# Patient Record
Sex: Male | Born: 1945 | Race: White | Hispanic: No | Marital: Married | Smoking: Former smoker
Health system: Southern US, Community
[De-identification: ages and names within clinical notes are randomized; demographics above are authoritative.]

## PROBLEM LIST (undated history)

## (undated) DIAGNOSIS — T884XXA Failed or difficult intubation, initial encounter: Secondary | ICD-10-CM

## (undated) DIAGNOSIS — K219 Gastro-esophageal reflux disease without esophagitis: Secondary | ICD-10-CM

## (undated) DIAGNOSIS — I10 Essential (primary) hypertension: Secondary | ICD-10-CM

## (undated) DIAGNOSIS — G4730 Sleep apnea, unspecified: Secondary | ICD-10-CM

## (undated) DIAGNOSIS — Z803 Family history of malignant neoplasm of breast: Secondary | ICD-10-CM

## (undated) DIAGNOSIS — R519 Headache, unspecified: Secondary | ICD-10-CM

## (undated) DIAGNOSIS — F32A Depression, unspecified: Secondary | ICD-10-CM

## (undated) DIAGNOSIS — I2510 Atherosclerotic heart disease of native coronary artery without angina pectoris: Secondary | ICD-10-CM

## (undated) DIAGNOSIS — Z9189 Other specified personal risk factors, not elsewhere classified: Secondary | ICD-10-CM

## (undated) DIAGNOSIS — R0600 Dyspnea, unspecified: Secondary | ICD-10-CM

## (undated) DIAGNOSIS — I219 Acute myocardial infarction, unspecified: Secondary | ICD-10-CM

## (undated) DIAGNOSIS — C61 Malignant neoplasm of prostate: Secondary | ICD-10-CM

## (undated) DIAGNOSIS — R51 Headache: Secondary | ICD-10-CM

## (undated) DIAGNOSIS — Z8619 Personal history of other infectious and parasitic diseases: Secondary | ICD-10-CM

## (undated) DIAGNOSIS — C801 Malignant (primary) neoplasm, unspecified: Secondary | ICD-10-CM

## (undated) DIAGNOSIS — G629 Polyneuropathy, unspecified: Secondary | ICD-10-CM

## (undated) DIAGNOSIS — E119 Type 2 diabetes mellitus without complications: Secondary | ICD-10-CM

## (undated) DIAGNOSIS — E785 Hyperlipidemia, unspecified: Secondary | ICD-10-CM

## (undated) DIAGNOSIS — F329 Major depressive disorder, single episode, unspecified: Secondary | ICD-10-CM

## (undated) DIAGNOSIS — K5900 Constipation, unspecified: Secondary | ICD-10-CM

## (undated) DIAGNOSIS — Z8546 Personal history of malignant neoplasm of prostate: Secondary | ICD-10-CM

## (undated) DIAGNOSIS — N189 Chronic kidney disease, unspecified: Secondary | ICD-10-CM

## (undated) DIAGNOSIS — D649 Anemia, unspecified: Secondary | ICD-10-CM

## (undated) DIAGNOSIS — T8859XA Other complications of anesthesia, initial encounter: Secondary | ICD-10-CM

## (undated) DIAGNOSIS — J189 Pneumonia, unspecified organism: Secondary | ICD-10-CM

## (undated) DIAGNOSIS — M1990 Unspecified osteoarthritis, unspecified site: Secondary | ICD-10-CM

## (undated) DIAGNOSIS — F4310 Post-traumatic stress disorder, unspecified: Secondary | ICD-10-CM

## (undated) DIAGNOSIS — K760 Fatty (change of) liver, not elsewhere classified: Secondary | ICD-10-CM

## (undated) HISTORY — DX: Depression, unspecified: F32.A

## (undated) HISTORY — DX: Malignant neoplasm of prostate: C61

## (undated) HISTORY — DX: Family history of malignant neoplasm of breast: Z80.3

## (undated) HISTORY — DX: Personal history of malignant neoplasm of prostate: Z85.46

## (undated) HISTORY — DX: Chronic kidney disease, unspecified: N18.9

## (undated) HISTORY — DX: Major depressive disorder, single episode, unspecified: F32.9

## (undated) HISTORY — PX: APPENDECTOMY: SHX54

## (undated) HISTORY — DX: Malignant (primary) neoplasm, unspecified: C80.1

## (undated) HISTORY — DX: Hyperlipidemia, unspecified: E78.5

## (undated) HISTORY — DX: Headache, unspecified: R51.9

## (undated) HISTORY — DX: Post-traumatic stress disorder, unspecified: F43.10

## (undated) HISTORY — PX: CARDIAC SURGERY: SHX584

## (undated) HISTORY — DX: Headache: R51

## (undated) HISTORY — DX: Unspecified osteoarthritis, unspecified site: M19.90

## (undated) HISTORY — DX: Personal history of other infectious and parasitic diseases: Z86.19

## (undated) HISTORY — DX: Other specified personal risk factors, not elsewhere classified: Z91.89

## (undated) HISTORY — PX: EYE SURGERY: SHX253

## (undated) HISTORY — PX: COLONOSCOPY: SHX174

---
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
## 2002-12-15 HISTORY — PX: CORONARY ARTERY BYPASS GRAFT: SHX141

## 2008-12-15 HISTORY — PX: CHOLECYSTECTOMY: SHX55

## 2017-02-11 DIAGNOSIS — R300 Dysuria: Secondary | ICD-10-CM | POA: Diagnosis not present

## 2017-02-11 DIAGNOSIS — R350 Frequency of micturition: Secondary | ICD-10-CM | POA: Diagnosis not present

## 2017-02-11 DIAGNOSIS — R3129 Other microscopic hematuria: Secondary | ICD-10-CM | POA: Diagnosis not present

## 2017-02-11 DIAGNOSIS — Z8546 Personal history of malignant neoplasm of prostate: Secondary | ICD-10-CM | POA: Diagnosis not present

## 2017-02-23 DIAGNOSIS — N451 Epididymitis: Secondary | ICD-10-CM | POA: Diagnosis not present

## 2017-02-23 DIAGNOSIS — N390 Urinary tract infection, site not specified: Secondary | ICD-10-CM | POA: Diagnosis not present

## 2017-02-24 DIAGNOSIS — N451 Epididymitis: Secondary | ICD-10-CM | POA: Diagnosis not present

## 2017-02-24 DIAGNOSIS — N509 Disorder of male genital organs, unspecified: Secondary | ICD-10-CM | POA: Diagnosis not present

## 2017-02-24 DIAGNOSIS — N433 Hydrocele, unspecified: Secondary | ICD-10-CM | POA: Diagnosis not present

## 2017-02-25 DIAGNOSIS — N451 Epididymitis: Secondary | ICD-10-CM | POA: Diagnosis not present

## 2017-02-26 DIAGNOSIS — N390 Urinary tract infection, site not specified: Secondary | ICD-10-CM | POA: Diagnosis not present

## 2017-02-27 DIAGNOSIS — N451 Epididymitis: Secondary | ICD-10-CM | POA: Diagnosis not present

## 2017-03-16 DIAGNOSIS — N451 Epididymitis: Secondary | ICD-10-CM | POA: Diagnosis not present

## 2017-03-16 DIAGNOSIS — N432 Other hydrocele: Secondary | ICD-10-CM | POA: Diagnosis not present

## 2017-03-16 DIAGNOSIS — J0100 Acute maxillary sinusitis, unspecified: Secondary | ICD-10-CM | POA: Diagnosis not present

## 2017-03-16 DIAGNOSIS — R3129 Other microscopic hematuria: Secondary | ICD-10-CM | POA: Diagnosis not present

## 2017-03-16 DIAGNOSIS — N390 Urinary tract infection, site not specified: Secondary | ICD-10-CM | POA: Diagnosis not present

## 2017-06-18 DIAGNOSIS — H16041 Marginal corneal ulcer, right eye: Secondary | ICD-10-CM | POA: Diagnosis not present

## 2017-06-23 DIAGNOSIS — E782 Mixed hyperlipidemia: Secondary | ICD-10-CM | POA: Diagnosis not present

## 2017-06-23 DIAGNOSIS — I10 Essential (primary) hypertension: Secondary | ICD-10-CM | POA: Diagnosis not present

## 2017-06-23 DIAGNOSIS — Z951 Presence of aortocoronary bypass graft: Secondary | ICD-10-CM | POA: Diagnosis not present

## 2017-06-23 DIAGNOSIS — I25110 Atherosclerotic heart disease of native coronary artery with unstable angina pectoris: Secondary | ICD-10-CM | POA: Diagnosis not present

## 2017-06-26 DIAGNOSIS — I25119 Atherosclerotic heart disease of native coronary artery with unspecified angina pectoris: Secondary | ICD-10-CM | POA: Diagnosis not present

## 2017-06-26 DIAGNOSIS — N189 Chronic kidney disease, unspecified: Secondary | ICD-10-CM | POA: Diagnosis not present

## 2017-06-26 DIAGNOSIS — I129 Hypertensive chronic kidney disease with stage 1 through stage 4 chronic kidney disease, or unspecified chronic kidney disease: Secondary | ICD-10-CM | POA: Diagnosis not present

## 2017-06-26 DIAGNOSIS — E782 Mixed hyperlipidemia: Secondary | ICD-10-CM | POA: Diagnosis not present

## 2017-06-26 DIAGNOSIS — Z7982 Long term (current) use of aspirin: Secondary | ICD-10-CM | POA: Diagnosis not present

## 2017-06-26 DIAGNOSIS — Z87891 Personal history of nicotine dependence: Secondary | ICD-10-CM | POA: Diagnosis not present

## 2017-06-26 DIAGNOSIS — Z955 Presence of coronary angioplasty implant and graft: Secondary | ICD-10-CM | POA: Diagnosis not present

## 2017-06-26 DIAGNOSIS — E1122 Type 2 diabetes mellitus with diabetic chronic kidney disease: Secondary | ICD-10-CM | POA: Diagnosis not present

## 2017-06-26 DIAGNOSIS — I25110 Atherosclerotic heart disease of native coronary artery with unstable angina pectoris: Secondary | ICD-10-CM | POA: Diagnosis not present

## 2017-06-26 DIAGNOSIS — F4310 Post-traumatic stress disorder, unspecified: Secondary | ICD-10-CM | POA: Diagnosis not present

## 2017-06-26 HISTORY — PX: CARDIAC CATHETERIZATION: SHX172

## 2017-06-30 DIAGNOSIS — E119 Type 2 diabetes mellitus without complications: Secondary | ICD-10-CM | POA: Diagnosis not present

## 2017-06-30 DIAGNOSIS — I209 Angina pectoris, unspecified: Secondary | ICD-10-CM | POA: Diagnosis not present

## 2017-06-30 DIAGNOSIS — K210 Gastro-esophageal reflux disease with esophagitis: Secondary | ICD-10-CM | POA: Diagnosis not present

## 2017-06-30 DIAGNOSIS — I119 Hypertensive heart disease without heart failure: Secondary | ICD-10-CM | POA: Diagnosis not present

## 2017-07-03 DIAGNOSIS — N451 Epididymitis: Secondary | ICD-10-CM | POA: Diagnosis not present

## 2017-07-03 DIAGNOSIS — N509 Disorder of male genital organs, unspecified: Secondary | ICD-10-CM | POA: Diagnosis not present

## 2017-07-03 DIAGNOSIS — N432 Other hydrocele: Secondary | ICD-10-CM | POA: Diagnosis not present

## 2017-07-24 DIAGNOSIS — H16042 Marginal corneal ulcer, left eye: Secondary | ICD-10-CM | POA: Diagnosis not present

## 2017-07-28 DIAGNOSIS — H16043 Marginal corneal ulcer, bilateral: Secondary | ICD-10-CM | POA: Diagnosis not present

## 2017-10-12 DIAGNOSIS — Z8546 Personal history of malignant neoplasm of prostate: Secondary | ICD-10-CM | POA: Diagnosis not present

## 2017-10-12 DIAGNOSIS — R3129 Other microscopic hematuria: Secondary | ICD-10-CM | POA: Diagnosis not present

## 2017-10-12 DIAGNOSIS — R300 Dysuria: Secondary | ICD-10-CM | POA: Diagnosis not present

## 2018-05-12 DIAGNOSIS — R3129 Other microscopic hematuria: Secondary | ICD-10-CM | POA: Diagnosis not present

## 2018-05-12 DIAGNOSIS — N451 Epididymitis: Secondary | ICD-10-CM | POA: Diagnosis not present

## 2018-05-12 DIAGNOSIS — Z8546 Personal history of malignant neoplasm of prostate: Secondary | ICD-10-CM | POA: Diagnosis not present

## 2018-06-09 DIAGNOSIS — N182 Chronic kidney disease, stage 2 (mild): Secondary | ICD-10-CM | POA: Diagnosis not present

## 2018-06-09 DIAGNOSIS — E119 Type 2 diabetes mellitus without complications: Secondary | ICD-10-CM | POA: Diagnosis not present

## 2018-06-09 DIAGNOSIS — I119 Hypertensive heart disease without heart failure: Secondary | ICD-10-CM | POA: Diagnosis not present

## 2018-06-09 DIAGNOSIS — M1710 Unilateral primary osteoarthritis, unspecified knee: Secondary | ICD-10-CM | POA: Diagnosis not present

## 2018-06-10 DIAGNOSIS — H35033 Hypertensive retinopathy, bilateral: Secondary | ICD-10-CM | POA: Diagnosis not present

## 2018-06-10 DIAGNOSIS — H43813 Vitreous degeneration, bilateral: Secondary | ICD-10-CM | POA: Diagnosis not present

## 2018-06-10 DIAGNOSIS — E119 Type 2 diabetes mellitus without complications: Secondary | ICD-10-CM | POA: Diagnosis not present

## 2018-06-10 DIAGNOSIS — Z961 Presence of intraocular lens: Secondary | ICD-10-CM | POA: Diagnosis not present

## 2018-06-10 DIAGNOSIS — Z794 Long term (current) use of insulin: Secondary | ICD-10-CM | POA: Diagnosis not present

## 2018-06-14 DIAGNOSIS — M25561 Pain in right knee: Secondary | ICD-10-CM | POA: Diagnosis not present

## 2018-06-14 DIAGNOSIS — M1711 Unilateral primary osteoarthritis, right knee: Secondary | ICD-10-CM | POA: Diagnosis not present

## 2018-06-29 DIAGNOSIS — M1711 Unilateral primary osteoarthritis, right knee: Secondary | ICD-10-CM | POA: Diagnosis not present

## 2018-06-29 DIAGNOSIS — M25561 Pain in right knee: Secondary | ICD-10-CM | POA: Diagnosis not present

## 2018-08-12 DIAGNOSIS — N433 Hydrocele, unspecified: Secondary | ICD-10-CM | POA: Diagnosis not present

## 2018-08-12 DIAGNOSIS — N451 Epididymitis: Secondary | ICD-10-CM | POA: Diagnosis not present

## 2018-08-19 DIAGNOSIS — R3129 Other microscopic hematuria: Secondary | ICD-10-CM | POA: Diagnosis not present

## 2018-08-19 DIAGNOSIS — N451 Epididymitis: Secondary | ICD-10-CM | POA: Diagnosis not present

## 2018-08-19 DIAGNOSIS — Z8546 Personal history of malignant neoplasm of prostate: Secondary | ICD-10-CM | POA: Diagnosis not present

## 2018-10-25 DIAGNOSIS — H6121 Impacted cerumen, right ear: Secondary | ICD-10-CM | POA: Diagnosis not present

## 2018-11-02 ENCOUNTER — Ambulatory Visit (HOSPITAL_COMMUNITY)
Admission: EM | Admit: 2018-11-02 | Discharge: 2018-11-02 | Disposition: A | Discharge status: Home / Self Care | Payer: Medicare Other | Attending: Family Medicine | Admitting: Family Medicine

## 2018-11-02 ENCOUNTER — Encounter (HOSPITAL_COMMUNITY): Payer: Self-pay | Admitting: Emergency Medicine

## 2018-11-02 DIAGNOSIS — H6121 Impacted cerumen, right ear: Secondary | ICD-10-CM | POA: Diagnosis not present

## 2018-11-02 HISTORY — DX: Essential (primary) hypertension: I10

## 2018-11-02 HISTORY — DX: Type 2 diabetes mellitus without complications: E11.9

## 2018-11-02 NOTE — ED Triage Notes (Signed)
Pt here for clogged right ear with possible cotton stuck in ear

## 2018-11-02 NOTE — ED Provider Notes (Addendum)
[place]    CSN: [id number] Arrival date & time: 11/02/18  1106     History   Chief Complaint Chief Complaint  Patient presents with  . Foreign Body in [place] is a 72 y.o. male.   Pt is a 72 year old male that presents with possible ear wax or foreign body in the right ear. He believed it to be wax and was seen at another urgent care and they were unable to remove the wax. He was then given cortisporin and ear wax removal drops and since has had no relief. He denies any pain or discomfort. He has some ringing at times and hard of hearing. He denies any associated fever, chills, body aches, fatigue.   ROS per HPI      Past Medical History:  Diagnosis Date  . Diabetes mellitus without complication ([place])   . Hypertension     There are no active problems to display for this patient.   History reviewed. No pertinent surgical history.     Home Medications    Prior to Admission medications   Medication Sig Start Date End Date Taking? Authorizing Provider  aspirin 81 MG chewable tablet Chew by mouth daily.   Yes [provider]  FLUoxetine (PROZAC) 40 MG capsule Take 40 mg by mouth daily.   Yes [provider]  hydrochlorothiazide (HYDRODIURIL) 25 MG tablet Take 25 mg by mouth daily.   Yes [provider]  insulin glargine (LANTUS) 100 UNIT/ML injection Inject 90 Units into the skin daily.   Yes [provider]  insulin regular (NOVOLIN R,HUMULIN R) 100 units/mL injection Inject 5 Units into the skin 3 (three) times daily before meals.   Yes [provider]  isosorbide mononitrate (IMDUR) 30 MG 24 hr tablet Take 30 mg by mouth daily.   Yes [provider]  losartan (COZAAR) 100 MG tablet Take 100 mg by mouth daily.   Yes [provider]  metoprolol tartrate (LOPRESSOR) 25 MG tablet Take 25 mg by mouth 2 (two) times daily.   Yes [provider]    neomycin-colistin-hydrocortisone-thonzonium (CORTISPORIN TC) 3.02-14-09-0.5 MG/ML OTIC suspension Place 3 drops into the right ear 4 (four) times daily.   Yes [provider]  nitroGLYCERIN (NITROSTAT) 0.4 MG SL tablet Place 0.4 mg under the tongue every 5 (five) minutes as needed for chest pain.   Yes [provider]  simvastatin (ZOCOR) 40 MG tablet Take 40 mg by mouth daily.   Yes [provider]  tamsulosin (FLOMAX) 0.4 MG CAPS capsule Take 0.4 mg by mouth.   Yes [provider]  traZODone (DESYREL) 100 MG tablet Take 100 mg by mouth at bedtime.   Yes [provider]    Family History History reviewed. No pertinent family history.  Social History Social History   Tobacco Use  . Smoking status: Former Research scientist (life sciences)  . Smokeless tobacco: Never Used  Substance Use Topics  . Alcohol use: Not Currently  . Drug use: Never     Allergies   Patient has no known allergies.   Review of Systems Review of Systems   Physical Exam Triage Vital Signs ED Triage Vitals [11/02/18 1151]  Enc Vitals Group     BP (!) 121/51     Pulse Rate 60     Resp 18     Temp 98 F (36.7 C)     Temp Source Oral     SpO2 96 %  Weight      Height      Head Circumference      Peak Flow      Pain Score 0     Pain Loc      Pain Edu?      Excl. in [place]?    No data found.  Updated Vital Signs BP (!) 121/51 (BP Location: Left Arm)   Pulse 60   Temp 98 F (36.7 C) (Oral)   Resp 18   SpO2 96%   Visual Acuity Right Eye Distance:   Left Eye Distance:   Bilateral Distance:    Right Eye Near:   Left Eye Near:    Bilateral Near:     Physical Exam  Constitutional: He appears well-developed and well-nourished.  HENT:  Head: Normocephalic and atraumatic.  Left TM mild cerumen impaction but TM visible and normal Right TM cerumen impaction and unable to visualize TM  Eyes: Conjunctivae are normal.  Neck: Normal range of motion.  Pulmonary/Chest: Effort  normal.  Musculoskeletal: Normal range of motion.  Neurological: He is alert.  Skin: Skin is warm and dry.  Psychiatric: He has a normal mood and affect.  Nursing note and vitals reviewed.    UC Treatments / Results  Labs (all labs ordered are listed, but only abnormal results are displayed) Labs Reviewed - No data to display  EKG None  Radiology No results found.  Procedures Ear Cerumen Removal Date/Time: 11/02/2018 3:35 PM Performed by: Orvan July, NP Authorized by: Vanessa Kick, MD   Consent:    Consent obtained:  Verbal   Consent given by:  Patient   Alternatives discussed:  No treatment Procedure details:    Location:  R ear   Procedure type: irrigation   Post-procedure details:    Inspection:  TM intact   Hearing quality:  Improved   Patient tolerance of procedure:  Tolerated well, no immediate complications   (including critical care time)  Medications Ordered in UC Medications - No data to display  Initial Impression / Assessment and Plan / UC Course  I have reviewed the triage vital signs and the nursing notes.  Pertinent labs & imaging results that were available during my care of the patient were reviewed by me and considered in my medical decision making (see chart for details).     Moderate amount of wax removed from patient right ear Patient feeling relief and able to hear better TM viewable and normal-no signs of infection Final Clinical Impressions(s) / UC Diagnoses   Final diagnoses:  Impacted cerumen of right ear     Discharge Instructions     We removed the wax from your ear I hope you are feeling better Follow up as needed for continued or worsening symptoms     ED Prescriptions    None     Controlled Substance Prescriptions [place] Controlled Substance Registry consulted? Not Applicable   Orvan July, NP 11/02/18 1535    Loura Halt A, NP 11/02/18 1536

## 2018-11-02 NOTE — Discharge Instructions (Signed)
We removed the wax from your ear I hope you are feeling better Follow up as needed for continued or worsening symptoms

## 2018-11-30 ENCOUNTER — Ambulatory Visit (INDEPENDENT_AMBULATORY_CARE_PROVIDER_SITE_OTHER): Payer: Medicare Other | Admitting: Family Medicine

## 2018-11-30 ENCOUNTER — Encounter: Payer: Self-pay | Admitting: Family Medicine

## 2018-11-30 VITALS — BP 130/84 | HR 69 | Temp 98.3°F | Resp 16 | Ht 72.0 in | Wt 289.2 lb

## 2018-11-30 DIAGNOSIS — E1140 Type 2 diabetes mellitus with diabetic neuropathy, unspecified: Secondary | ICD-10-CM

## 2018-11-30 DIAGNOSIS — I2510 Atherosclerotic heart disease of native coronary artery without angina pectoris: Secondary | ICD-10-CM | POA: Diagnosis not present

## 2018-11-30 DIAGNOSIS — G6289 Other specified polyneuropathies: Secondary | ICD-10-CM

## 2018-11-30 DIAGNOSIS — N401 Enlarged prostate with lower urinary tract symptoms: Secondary | ICD-10-CM

## 2018-11-30 DIAGNOSIS — Z794 Long term (current) use of insulin: Secondary | ICD-10-CM

## 2018-11-30 DIAGNOSIS — Z8546 Personal history of malignant neoplasm of prostate: Secondary | ICD-10-CM

## 2018-11-30 DIAGNOSIS — I10 Essential (primary) hypertension: Secondary | ICD-10-CM | POA: Diagnosis not present

## 2018-11-30 DIAGNOSIS — G4700 Insomnia, unspecified: Secondary | ICD-10-CM | POA: Diagnosis not present

## 2018-11-30 LAB — BASIC METABOLIC PANEL
BUN: 20 mg/dL (ref 6–23)
CHLORIDE: 102 meq/L (ref 96–112)
CO2: 29 mEq/L (ref 19–32)
CREATININE: 1.56 mg/dL — AB (ref 0.40–1.50)
Calcium: 9.1 mg/dL (ref 8.4–10.5)
GFR: 46.46 mL/min — ABNORMAL LOW (ref >60.00)
GLUCOSE: 185 mg/dL — AB (ref 70–99)
Potassium: 3.7 mEq/L (ref 3.5–5.1)
Sodium: 140 mEq/L (ref 135–145)

## 2018-11-30 LAB — HEMOGLOBIN A1C: HEMOGLOBIN A1C: 5.5 % (ref 4.6–6.5)

## 2018-11-30 LAB — MICROALBUMIN / CREATININE URINE RATIO
CREATININE, U: 160.7 mg/dL
Microalb Creat Ratio: 3.5 mg/g (ref 0.0–30.0)
Microalb, Ur: 5.7 mg/dL — ABNORMAL HIGH (ref 0.0–1.9)

## 2018-11-30 MED ORDER — GABAPENTIN 100 MG PO CAPS: ORAL_CAPSULE | ORAL | 0 refills | Status: DC

## 2018-11-30 NOTE — Progress Notes (Signed)
HPI:   [name] is a 72 y.o. male, who is here today with his wife to establish care.  Former PCP: Dr [name]. He just moved from [place] Last preventive routine visit: 06/2018.  Chronic medical problems: Hypertension, hyperlipidemia, CAD, BPH, insomnia, and unstable gait (OA). Generalized OA, knees,hips,low back. Exacerbated by prolonged walking/standing and when getting up after prolonged sitting. + Stiffness   Dizzy spells intermittent for many years. No falls in 5 years.  Insomnia on Trazodone 100 mg at bedtime. Medication helps. No side effects reported.  HTN: Currently he is on metoprolol tartrate 25 mg twice daily, losartan 100 mg daily, Imdur 30 mg daily, and HCTZ 25 mg daily. Denies severe/frequent headache, visual changes, chest pain, dyspnea, palpitation,focal weakness, or edema. CAD (Band PAD, he has been following with cardiologist annually. + CKD III  DM 2: Dx around 2015. He is on Lantus 90 U daily and Novolin 5 U before meals. BS's 150's Occasional hypoglycemia, does not recall numbers, usually when he skips meals.. Denies abdominal pain, nausea,vomiting, polydipsia,polyuria, or polyphagia.  C/O LE numbness and tingling. Feet achy pain,mainly at night. Problem has been going on for years.   HLD, he is on Simvastatin 40 mg daily. He also follows a low fat diet. No side effects from medication.   GERD, he is on Omeprazole 40 mg bid.  Denies abdominal pain, nausea, vomiting, changes in bowel habits, blood in stool or melena.  Urinary incontinence and microscopic hematuria. He follows with urologist q 3 months.  Hx of prostate cancer, completed radiation over 5 years ago.  Depression Dx many years ago. He has been on Prozac 20 mg since 1995. Denies suicidal thoughts.   Review of Systems  Constitutional: Negative for activity change, appetite change, fatigue and fever.  HENT: Negative for nosebleeds, sore throat and trouble  swallowing.   Eyes: Negative for redness and visual disturbance.  Respiratory: Negative for cough, shortness of breath and wheezing.   Cardiovascular: Negative for chest pain, palpitations and leg swelling.  Gastrointestinal: Negative for abdominal pain, nausea and vomiting.  Endocrine: Negative for polydipsia, polyphagia and polyuria.  Genitourinary: Negative for decreased urine volume, dysuria and hematuria.  Musculoskeletal: Positive for arthralgias and gait problem.  Neurological: Negative for syncope, weakness and headaches.  Psychiatric/Behavioral: Positive for sleep disturbance. Negative for confusion.      Current Outpatient Medications on File Prior to Visit  Medication Sig Dispense Refill  . aspirin 81 MG chewable tablet Chew by mouth daily.    . Calcium Carbonate (CALCIUM 600 PO) Take by mouth daily.    Marland Kitchen FLUoxetine (PROZAC) 40 MG capsule Take 40 mg by mouth daily.    . hydrochlorothiazide (HYDRODIURIL) 25 MG tablet Take 25 mg by mouth daily.    . insulin glargine (LANTUS) 100 UNIT/ML injection Inject 90 Units into the skin daily.    . insulin regular (NOVOLIN R,HUMULIN R) 100 units/mL injection Inject 5 Units into the skin 3 (three) times daily before meals.    . isosorbide mononitrate (IMDUR) 30 MG 24 hr tablet Take 30 mg by mouth daily.    Marland Kitchen losartan (COZAAR) 100 MG tablet Take 100 mg by mouth daily.    . metoprolol tartrate (LOPRESSOR) 25 MG tablet Take 25 mg by mouth 2 (two) times daily.    . Multiple Vitamin (MULTI-VITAMINS) TABS Take by mouth.    . neomycin-colistin-hydrocortisone-thonzonium (CORTISPORIN TC) 3.02-14-09-0.5 MG/ML OTIC suspension Place 3 drops into the right ear 4 (four) times daily.    Marland Kitchen  nitroGLYCERIN (NITROSTAT) 0.4 MG SL tablet Place 0.4 mg under the tongue every 5 (five) minutes as needed for chest pain.    . simvastatin (ZOCOR) 40 MG tablet Take 40 mg by mouth daily.    . tamsulosin (FLOMAX) 0.4 MG CAPS capsule Take 0.4 mg by mouth.    . traZODone  (DESYREL) 100 MG tablet Take 100 mg by mouth at bedtime.     No current facility-administered medications on file prior to visit.      Past Medical History:  Diagnosis Date  . Arthritis   . Cancer ([place])   . Chronic kidney disease   . Depression   . Diabetes mellitus without complication ([place])   . Frequent headaches   . History of chicken pox   . History of fainting spells of unknown cause   . Hyperlipidemia   . Hypertension    No Known Allergies  Family History  Problem Relation Age of Onset  . Arthritis Mother   . Cancer Mother   . Heart disease Mother   . Hyperlipidemia Mother   . Hypertension Mother   . Heart attack Mother   . Cancer Sister   . Heart attack Brother   . Heart disease Brother   . Depression Daughter   . Arthritis Sister   . Cancer Brother   . Learning disabilities Brother   . Alcohol abuse Brother   . COPD Brother   . Heart attack Brother   . Heart disease Brother     Social History   Socioeconomic History  . Marital status: Married    Spouse name: Not on file  . Number of children: Not on file  . Years of education: Not on file  . Highest education level: Not on file  Occupational History  . Not on file  Social Needs  . Financial resource strain: Not on file  . Food insecurity:    Worry: Not on file    Inability: Not on file  . Transportation needs:    Medical: Not on file    Non-medical: Not on file  Tobacco Use  . Smoking status: Former Research scientist (life sciences)  . Smokeless tobacco: Never Used  Substance and Sexual Activity  . Alcohol use: Not Currently  . Drug use: Never  . Sexual activity: Not Currently  Lifestyle  . Physical activity:    Days per week: Not on file    Minutes per session: Not on file  . Stress: Not on file  Relationships  . Social connections:    Talks on phone: Not on file    Gets together: Not on file    Attends religious service: Not on file    Active member of club or organization: Not on file    Attends meetings  of clubs or organizations: Not on file    Relationship status: Not on file  Other Topics Concern  . Not on file  Social History Narrative  . Not on file    Vitals:   11/30/18 0834  BP: 130/84  Pulse: 69  Resp: 16  Temp: 98.3 F (36.8 C)  SpO2: 96%    Body mass index is 39.23 kg/m.   Physical Exam  Nursing note and vitals reviewed. Constitutional: He is oriented to person, place, and time. He appears well-developed. No distress.  HENT:  Head: Normocephalic and atraumatic.  Mouth/Throat: Oropharynx is clear and moist and mucous membranes are normal.  Eyes: Pupils are equal, round, and reactive to light. Conjunctivae are normal.  Cardiovascular:  Normal rate and regular rhythm.  No murmur heard. Pulses:      Dorsalis pedis pulses are 2+ on the right side and 2+ on the left side.  Respiratory: Effort normal and breath sounds normal. No respiratory distress.  GI: Soft. He exhibits no mass. There is no hepatomegaly. There is no abdominal tenderness.  Musculoskeletal:        General: No edema.  Lymphadenopathy:    He has no cervical adenopathy.  Neurological: He is alert and oriented to person, place, and time. He has normal strength. No cranial nerve deficit. Gait abnormal.  Unstable gait assisted with a cane.  Skin: Skin is warm. No rash noted. No erythema.  Psychiatric: He has a normal mood and affect. Cognition and memory are normal.  Well groomed, good eye contact.   Diabetic Foot Exam - Simple   Simple Foot Form Diabetic Foot exam was performed with the following findings:  Yes 11/30/2018  9:12 AM  Visual Inspection See comments:  Yes Sensation Testing See comments:  Yes Pulse Check Posterior Tibialis and Dorsalis pulse intact bilaterally:  Yes Comments Monofilament absent, bilateral. Hypertrophic and long toenails.       ASSESSMENT AND PLAN:  [name] was seen today for establish care.  Diagnoses and all orders for this visit:  Lab Results    Component Value Date   CREATININE 1.56 (H) 11/30/2018   BUN 20 11/30/2018   NA 140 11/30/2018   K 3.7 11/30/2018   CL 102 11/30/2018   CO2 29 11/30/2018   Lab Results  Component Value Date   HGBA1C 5.5 11/30/2018   Lab Results  Component Value Date   HGBA1C 5.5 11/30/2018    Type 2 diabetes mellitus with diabetic neuropathy, with long-term current use of insulin ([place]) HgA1C has been at goal, 6.1. No changes in current management, will adjust insulin according to A1C done today. Regular exercise as tolerated and healthy diet with avoidance of added sugar food intake is an important part of treatment and recommended. Annual eye exam, periodic dental and foot care recommended. F/U in 5-6 months  -     Microalbumin / creatinine urine ratio -     Basic metabolic panel -     Hemoglobin A1c  Other polyneuropathy Adequate DM controlled. Appropriate foot care. After discussion of some side effects he agrees with trying Gabapentin. He will start with Gabapentin 100 mg at bedtime,instructed to titrate dose as tolerated to 300 mg.  -     gabapentin (NEURONTIN) 100 MG capsule; Start with 1 capsule at bedtime for 10 days, increase it to 2 capsules for 10 days, and then 3 capsules.  Hypertension, essential, benign Adequately controlled. No changes in current management. Low salt diet to continua. Eye exam recommended annually. F/U in 6 months, before if needed.  Benign prostatic hyperplasia with lower urinary tract symptoms, symptom details unspecified He will continue following with urologist.  -     Ambulatory referral to Urology  Coronary artery disease involving native coronary artery of native heart without angina pectoris -     Ambulatory referral to Cardiology  Personal history of prostate cancer -     Ambulatory referral to Urology  Insomnia, unspecified type Gabapentin started today may help with sleep,so he can try to decrease Trazodone from 100 mg to 50 mg. Good  sleep hygiene.    Blaike Vickers G. [name], MD  [facility]. [place] office.

## 2018-11-30 NOTE — Patient Instructions (Addendum)
A few things to remember from today's visit:   Type 2 diabetes mellitus with diabetic neuropathy, with long-term current use of insulin ([name]) - Plan: Microalbumin / creatinine urine ratio, Basic metabolic panel, Hemoglobin A1c  Other polyneuropathy - Plan: gabapentin (NEURONTIN) 100 MG capsule  Hypertension, essential, benign  Benign prostatic hyperplasia with lower urinary tract symptoms, symptom details unspecified - Plan: Ambulatory referral to Urology  Coronary artery disease involving native coronary artery of native heart without angina pectoris - Plan: Ambulatory referral to Cardiology  Personal history of prostate cancer - Plan: Ambulatory referral to Urology  Gabapentin added today, start with 1 capsule at bedtime and increase it by 1 capsule every 10 days.  Please let me know when you reach 3 capsules, 300 mg, so we can change prescription.   Please be sure medication list is accurate. If a new problem present, please set up appointment sooner than planned today.

## 2018-12-05 ENCOUNTER — Encounter: Payer: Self-pay | Admitting: Family Medicine

## 2018-12-12 NOTE — Progress Notes (Signed)
Cardiology Office Note   Date:  12/13/2018   ID:  [name], DOB [birth year], MRN [id number]  PCP:  [name], [name] G, MD  Cardiologist:   Peter [name], MD   Chief Complaint  Patient presents with  . New Patient (Initial Visit)  . Coronary Artery Disease      History of Present Illness: [name] is a 72 y.o. male who is seen at the request of Dr. [name] for evaluation of CAD. He has a history of DM on insulin, CKD, HTN and HLD. He has known CAD s/p CABG in 2004. Previously followed by Dr. Peterson Ao at [facility] in [place]. Last Cardiac cath in July 2019 for evaluation of chest pain showed 90% proximal LAD, occluded nondominant LCx and occluded RCA. The grafts were patent including LIMA to the LAD, SVG to RCA and SVG to ramus with 40-50% proximal stenosis. Normal EF and EDP. 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   He recently moved from [place] to be close to family. 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 He is married. He is very sedentary. Notes his legs get weak and he gets SOB with any activity. Has had some falls in the past. Walks with a cane. Denies any anginal symptoms. Does note intermittent heartburn at night. Notes anginal symptoms are more like indigestion as well but rarely uses Ntg.     Past Medical History:  Diagnosis Date  . Arthritis   . Cancer ([place])   . Chronic kidney disease   . Depression   . Diabetes mellitus without complication ([place])   . Frequent headaches   . History of chicken pox   . History of fainting spells of unknown cause   . Hyperlipidemia   . Hypertension   . PTSD (post-traumatic stress disorder)     Past Surgical History:  Procedure Laterality Date  . CARDIAC SURGERY     Triple Bypass  . CHOLECYSTECTOMY  2010  . CORONARY ARTERY BYPASS GRAFT  2004  . TONSILLECTOMY  1958     Current Outpatient Medications  Medication Sig Dispense Refill  .  aspirin 81 MG chewable tablet Chew by mouth daily.    . Calcium Carbonate (CALCIUM 600 PO) Take by mouth daily.    Marland Kitchen FLUoxetine (PROZAC) 40 MG capsule Take 40 mg by mouth daily.    Marland Kitchen gabapentin (NEURONTIN) 100 MG capsule Start with 1 capsule at bedtime for 10 days, increase it to 2 capsules for 10 days, and then 3 capsules. 90 capsule 0  . hydrochlorothiazide (HYDRODIURIL) 25 MG tablet Take 25 mg by mouth daily.    . insulin glargine (LANTUS) 100 UNIT/ML injection Inject 90 Units into the skin daily.    . insulin regular (NOVOLIN R,HUMULIN R) 100 units/mL injection Inject 5 Units into the skin 3 (three) times daily before meals.    . isosorbide mononitrate (IMDUR) 30 MG 24 hr tablet Take 30 mg by mouth daily.    Marland Kitchen losartan (COZAAR) 100 MG tablet Take 100 mg by mouth daily.    . metoprolol tartrate (LOPRESSOR) 25 MG tablet Take 25 mg by mouth 2 (two) times daily.    . Multiple Vitamin (MULTI-VITAMINS) TABS Take by mouth.    . nitroGLYCERIN (NITROSTAT) 0.4 MG SL tablet Place 0.4 mg under the tongue every 5 (five) minutes as needed for chest pain.    . ranitidine (ZANTAC) 150 MG capsule  Take 150 mg by mouth 2 (two) times daily.    . simvastatin (ZOCOR) 40 MG tablet Take 40 mg by mouth daily.    . tamsulosin (FLOMAX) 0.4 MG CAPS capsule Take 0.4 mg by mouth.    . traZODone (DESYREL) 100 MG tablet Take 100 mg by mouth at bedtime.     No current facility-administered medications for this visit.     Allergies:   Patient has no known allergies.    Social History:  The patient  reports that he quit smoking about 43 years ago. He has never used smokeless tobacco. He reports previous alcohol use. He reports that he does not use drugs.   Family History:  The patient's family history includes Alcohol abuse in his brother; Arthritis in his mother and sister; COPD in his brother; Cancer in his brother, mother, and sister; Depression in his daughter; Heart attack in his brother, brother, and mother; Heart  disease in his brother, brother, and mother; Hyperlipidemia in his mother; Hypertension in his mother; Learning disabilities in his brother.    ROS:  Please see the history of present illness.   Otherwise, review of systems are positive for none.   All other systems are reviewed and negative.    PHYSICAL EXAM: VS:  BP 122/62   Pulse 67   Ht 5\' 11"  (1.803 m)   Wt 291 lb 9.6 oz (132.3 kg)   BMI 40.67 kg/m  , BMI Body mass index is 40.67 kg/m. GEN: Well nourished, obese, in no acute distress  HEENT: normal  Neck: no JVD, carotid bruits, or masses Cardiac: RRR; no murmurs, rubs, or gallops,no edema  Respiratory:  clear to auscultation bilaterally, normal work of breathing GI: soft, nontender, nondistended, + BS MS: no deformity or atrophy  Skin: warm and dry, no rash Neuro:  Strength and sensation are intact Psych: euthymic mood, full affect   EKG:  EKG is ordered today. The ekg ordered today demonstrates NSR with mild NS TWA. I have personally reviewed and interpreted this study.    Recent Labs: 11/30/2018: BUN 20; Creatinine, Ser 1.56; Potassium 3.7; Sodium 140  A1c 5.5%  Lipid Panel No results found for: CHOL, TRIG, HDL, CHOLHDL, VLDL, LDLCALC, LDLDIRECT    Wt Readings from Last 3 Encounters:  12/13/18 291 lb 9.6 oz (132.3 kg)  11/30/18 289 lb 4 oz (131.2 kg)      Other studies Reviewed: Additional studies/ records that were reviewed today include:  Labs dated 06/26/17: Hgb 12.8. Creatinine 1.43. potassium 3.4.    ASSESSMENT AND PLAN:  1.  CAD s/p remote CABG in 2004. Cardiac cath in July 2019 showed patent grafts. No clear anginal symptoms. Continue ASA, statin, Imdur, metoprolol. Follow up in one year 2. DM on insulin with complication of CKD and neuropathy 3. HLD on Zocor. Will request last lab from Primary in [place] 4. HTN controlled.  5. PTSD   Current medicines are reviewed at length with the patient today.  The patient does not have concerns regarding  medicines.  The following changes have been made:  no change  Labs/ tests ordered today include:  No orders of the defined types were placed in this encounter.    Disposition:   FU with me in 1 year  Signed, Peter [name], MD  12/13/2018 9:13 AM    [facility] [address], [place], [place], [address] Phone [phone], Fax [phone]

## 2018-12-13 ENCOUNTER — Encounter: Payer: Self-pay | Admitting: Cardiology

## 2018-12-13 ENCOUNTER — Ambulatory Visit (INDEPENDENT_AMBULATORY_CARE_PROVIDER_SITE_OTHER): Payer: Medicare Other | Admitting: Cardiology

## 2018-12-13 VITALS — BP 122/62 | HR 67 | Ht 71.0 in | Wt 291.6 lb

## 2018-12-13 DIAGNOSIS — I25708 Atherosclerosis of coronary artery bypass graft(s), unspecified, with other forms of angina pectoris: Secondary | ICD-10-CM | POA: Diagnosis not present

## 2018-12-13 DIAGNOSIS — N183 Chronic kidney disease, stage 3 unspecified: Secondary | ICD-10-CM

## 2018-12-13 DIAGNOSIS — E118 Type 2 diabetes mellitus with unspecified complications: Secondary | ICD-10-CM

## 2018-12-13 DIAGNOSIS — I10 Essential (primary) hypertension: Secondary | ICD-10-CM | POA: Diagnosis not present

## 2018-12-13 DIAGNOSIS — Z794 Long term (current) use of insulin: Secondary | ICD-10-CM | POA: Diagnosis not present

## 2018-12-13 NOTE — Patient Instructions (Signed)
Continue your current therapy  I will see you in one year   

## 2018-12-14 ENCOUNTER — Telehealth: Payer: Self-pay

## 2018-12-14 DIAGNOSIS — I25810 Atherosclerosis of coronary artery bypass graft(s) without angina pectoris: Secondary | ICD-10-CM

## 2018-12-14 NOTE — Telephone Encounter (Signed)
Spoke to patient Dr.Jordan received lab work from PCP.He advised needs fasting lipid panel.Patient stated he will have done this week at our office.Lab order placed in lab box.

## 2018-12-16 DIAGNOSIS — I25810 Atherosclerosis of coronary artery bypass graft(s) without angina pectoris: Secondary | ICD-10-CM | POA: Diagnosis not present

## 2018-12-16 DIAGNOSIS — Z8546 Personal history of malignant neoplasm of prostate: Secondary | ICD-10-CM | POA: Diagnosis not present

## 2018-12-16 DIAGNOSIS — R3912 Poor urinary stream: Secondary | ICD-10-CM | POA: Diagnosis not present

## 2018-12-16 DIAGNOSIS — R3121 Asymptomatic microscopic hematuria: Secondary | ICD-10-CM | POA: Diagnosis not present

## 2018-12-16 DIAGNOSIS — R3915 Urgency of urination: Secondary | ICD-10-CM | POA: Diagnosis not present

## 2018-12-16 LAB — LIPID PANEL
CHOL/HDL RATIO: 2.7 ratio (ref 0.0–5.0)
Cholesterol, Total: 101 mg/dL (ref 100–199)
HDL: 38 mg/dL — ABNORMAL LOW (ref >39)
LDL Calculated: 39 mg/dL (ref 0–99)
Triglycerides: 122 mg/dL (ref 0–149)
VLDL Cholesterol Cal: 24 mg/dL (ref 5–40)

## 2018-12-17 ENCOUNTER — Telehealth: Payer: Self-pay | Admitting: Cardiology

## 2018-12-17 NOTE — Telephone Encounter (Signed)
error 

## 2018-12-17 NOTE — Telephone Encounter (Signed)
New message     Pt calling regarding lab results

## 2018-12-17 NOTE — Telephone Encounter (Signed)
Pt updated with lab results, along with Dr. Doug Sou recommendation. Pt verbalized understanding.

## 2019-01-19 ENCOUNTER — Encounter: Payer: Self-pay | Admitting: Family Medicine

## 2019-03-30 ENCOUNTER — Telehealth: Payer: Self-pay | Admitting: *Deleted

## 2019-03-30 NOTE — Telephone Encounter (Signed)
Pt will keep his appt on 04-01-2019

## 2019-03-30 NOTE — Telephone Encounter (Signed)
Copied from [place] [id number]. Topic: Appointment Scheduling - Scheduling Inquiry for Clinic >> Mar 29, 2019  5:39 PM Rutherford Nail, [place] wrote: Reason for CRM: Patient calling in regards to his appointment on 04/01/2019. States that he is not sick and it's just for a follow up. Would like for Dr [name] to advise whether this appointment is necessary at this time.  Clinic RN attempted to contact patient.  No answer. LVM for patient to return call. CRM created.

## 2019-04-01 ENCOUNTER — Ambulatory Visit (INDEPENDENT_AMBULATORY_CARE_PROVIDER_SITE_OTHER): Payer: Medicare Other | Admitting: Family Medicine

## 2019-04-01 ENCOUNTER — Other Ambulatory Visit: Payer: Self-pay

## 2019-04-01 ENCOUNTER — Telehealth: Payer: Self-pay | Admitting: *Deleted

## 2019-04-01 ENCOUNTER — Encounter: Payer: Self-pay | Admitting: Family Medicine

## 2019-04-01 DIAGNOSIS — G4700 Insomnia, unspecified: Secondary | ICD-10-CM

## 2019-04-01 DIAGNOSIS — N183 Chronic kidney disease, stage 3 unspecified: Secondary | ICD-10-CM | POA: Insufficient documentation

## 2019-04-01 DIAGNOSIS — G629 Polyneuropathy, unspecified: Secondary | ICD-10-CM | POA: Insufficient documentation

## 2019-04-01 DIAGNOSIS — F324 Major depressive disorder, single episode, in partial remission: Secondary | ICD-10-CM | POA: Diagnosis not present

## 2019-04-01 DIAGNOSIS — E1122 Type 2 diabetes mellitus with diabetic chronic kidney disease: Secondary | ICD-10-CM | POA: Diagnosis not present

## 2019-04-01 DIAGNOSIS — I119 Hypertensive heart disease without heart failure: Secondary | ICD-10-CM | POA: Insufficient documentation

## 2019-04-01 DIAGNOSIS — Z794 Long term (current) use of insulin: Secondary | ICD-10-CM | POA: Diagnosis not present

## 2019-04-01 DIAGNOSIS — E1129 Type 2 diabetes mellitus with other diabetic kidney complication: Secondary | ICD-10-CM | POA: Insufficient documentation

## 2019-04-01 NOTE — Telephone Encounter (Signed)
Spoke with patient and walked him through the process to connect with his PCP. Patient had appointment at 10:30 am with PCP on 04/01/2019. Nothing further needed at this time. Copied from [place] [id number]. Topic: Appointment Scheduling - Scheduling Inquiry for Clinic >> Mar 31, 2019  4:03 PM Yvette Rack wrote: Reason for CRM: Pt stated he was just returning the call to the office regarding his appt for tomorrow. Pt verified that he would like to keep his appt and that he would be available for the virtual visit.

## 2019-04-01 NOTE — Assessment & Plan Note (Signed)
BP mildly elevated today. He is not sure if BP monitor is accurate. For now no changes in current medications. Continue low-salt diet. We discussed possible complications of elevated BPs.

## 2019-04-01 NOTE — Progress Notes (Signed)
Virtual Visit via Video Note   I connected with [name] on 04/02/19 at 10:30 AM EDT by a video enabled telemedicine application and verified that I am speaking with the correct person using two identifiers.  Location patient: home Location provider:work office Persons participating in the virtual visit: patient, provider  I discussed the limitations of evaluation and management by telemedicine and the availability of in person appointments. He expressed understanding and agreed to proceed.   HPI: [name] is a 73 yo last seen on 11/30/18. He is being seen today for chronic disease management. Since his last OV he has followed with his cardiologist,Dr [name].  Hypertension:  Currently on Metoprolol Tartrate 25 mg bid and Losartan 100 mg daily . He is taking medications as instructed, no side effects reported. + CAD and CKD III. Negative for gross hematuria ,decreased urine output,or foam in urine. He has not noted unusual headache, visual changes, exertional chest pain, dyspnea,  focal weakness, or edema.  . Lab Results  Component Value Date   CREATININE 1.56 (H) 11/30/2018   BUN 20 11/30/2018   NA 140 11/30/2018   K 3.7 11/30/2018   CL 102 11/30/2018   CO2 29 11/30/2018    DM SW:FUXNA 2015. He is currently on Lantus 80 U daily,decreased from 90 U last visit. He is also on Novolin 5 U TID before meals.  Lab Results  Component Value Date   HGBA1C 5.5 11/30/2018   Lab Results  Component Value Date   MICROALBUR 5.7 (H) 11/30/2018   Denies episodes of hypoglycemia but BS's are frequently 80'2-90's. Denies abdominal pain, nausea,vomiting, polydipsia,polyuria, or polyphagia.   Currently he is on Lantus 80 units daily and Novolin 5 units before meals 3 times daily.  Symptoms of peripheral neuropathy is stable, LLE numbness and tingling. He is wife checks his feet periodically. He is not taking Gabapentin,recommened last visit. Unstable gait and OA, no falls since  his last OV.  Insomnia and depression, currently he is on Prozac 20 mg,which he has taken for years (1995). He denies depressed mood,dealing well with current stressors. Denies suicidal thoughts.  On Trazodone 100 mg at bedtime. He has taken med for many years and denies side effects. Sleeps "at least" 8 hours,feelsrested next day.    ROS: See pertinent positives and negatives per HPI.  Past Medical History:  Diagnosis Date  . Arthritis   . Cancer ([place])   . Chronic kidney disease   . Depression   . Diabetes mellitus without complication ([place])   . Frequent headaches   . History of chicken pox   . History of fainting spells of unknown cause   . Hyperlipidemia   . Hypertension   . PTSD (post-traumatic stress disorder)     Past Surgical History:  Procedure Laterality Date  . CARDIAC SURGERY     Triple Bypass  . CHOLECYSTECTOMY  2010  . CORONARY ARTERY BYPASS GRAFT  2004  . TONSILLECTOMY  1958    Family History  Problem Relation Age of Onset  . Arthritis Mother   . Cancer Mother   . Heart disease Mother   . Hyperlipidemia Mother   . Hypertension Mother   . Heart attack Mother   . Cancer Sister   . Heart attack Brother   . Heart disease Brother   . Depression Daughter   . Arthritis Sister   . Cancer Brother   . Learning disabilities Brother   . Alcohol abuse Brother   . COPD Brother   .  Heart attack Brother   . Heart disease Brother     Social History   Socioeconomic History  . Marital status: Married    Spouse name: Not on file  . Number of children: 2  . Years of education: Not on file  . Highest education level: Not on file  Occupational History  . Occupation: retired  Scientific laboratory technician  . Financial resource strain: Not on file  . Food insecurity:    Worry: Not on file    Inability: Not on file  . Transportation needs:    Medical: Not on file    Non-medical: Not on file  Tobacco Use  . Smoking status: Former Smoker    Last attempt to quit:  12/14/1975    Years since quitting: 43.3  . Smokeless tobacco: Never Used  Substance and Sexual Activity  . Alcohol use: Not Currently  . Drug use: Never  . Sexual activity: Not Currently  Lifestyle  . Physical activity:    Days per week: Not on file    Minutes per session: Not on file  . Stress: Not on file  Relationships  . Social connections:    Talks on phone: Not on file    Gets together: Not on file    Attends religious service: Not on file    Active member of club or organization: Not on file    Attends meetings of clubs or organizations: Not on file    Relationship status: Not on file  . Intimate partner violence:    Fear of current or ex partner: Not on file    Emotionally abused: Not on file    Physically abused: Not on file    Forced sexual activity: Not on file  Other Topics Concern  . Not on file  Social History Narrative  . Not on file      Current Outpatient Medications:  .  aspirin 81 MG chewable tablet, Chew by mouth daily., Disp: , Rfl:  .  Calcium Carbonate (CALCIUM 600 PO), Take by mouth daily., Disp: , Rfl:  .  FLUoxetine (PROZAC) 40 MG capsule, Take 40 mg by mouth daily., Disp: , Rfl:  .  gabapentin (NEURONTIN) 100 MG capsule, Start with 1 capsule at bedtime for 10 days, increase it to 2 capsules for 10 days, and then 3 capsules., Disp: 90 capsule, Rfl: 0 .  hydrochlorothiazide (HYDRODIURIL) 25 MG tablet, Take 25 mg by mouth daily., Disp: , Rfl:  .  insulin glargine (LANTUS) 100 UNIT/ML injection, Inject 90 Units into the skin daily., Disp: , Rfl:  .  insulin regular (NOVOLIN R,HUMULIN R) 100 units/mL injection, Inject 5 Units into the skin 3 (three) times daily before meals., Disp: , Rfl:  .  isosorbide mononitrate (IMDUR) 30 MG 24 hr tablet, Take 30 mg by mouth daily., Disp: , Rfl:  .  losartan (COZAAR) 100 MG tablet, Take 100 mg by mouth daily., Disp: , Rfl:  .  metoprolol tartrate (LOPRESSOR) 25 MG tablet, Take 25 mg by mouth 2 (two) times daily.,  Disp: , Rfl:  .  Multiple Vitamin (MULTI-VITAMINS) TABS, Take by mouth., Disp: , Rfl:  .  nitroGLYCERIN (NITROSTAT) 0.4 MG SL tablet, Place 0.4 mg under the tongue every 5 (five) minutes as needed for chest pain., Disp: , Rfl:  .  ranitidine (ZANTAC) 150 MG capsule, Take 150 mg by mouth 2 (two) times daily., Disp: , Rfl:  .  simvastatin (ZOCOR) 40 MG tablet, Take 40 mg by mouth daily., Disp: ,  Rfl:  .  tamsulosin (FLOMAX) 0.4 MG CAPS capsule, Take 0.4 mg by mouth., Disp: , Rfl:  .  traZODone (DESYREL) 100 MG tablet, Take 100 mg by mouth at bedtime., Disp: , Rfl:   EXAM:  VITALS per patient if applicable:BP (!) 536/14   Pulse 64   Resp 16   GENERAL: alert, oriented, appears well and in no acute distress  HEENT: atraumatic, conjunctiva clear, no obvious abnormalities on inspection of face.  NECK: normal movements of the head and neck  LUNGS: on inspection no signs of respiratory distress, breathing rate appears normal, no obvious gross SOB, gasping or wheezing  CV: no obvious cyanosis  MS: moves upper extremities without noticeable abnormality  PSYCH/NEURO: pleasant and cooperative, no obvious depression or anxiety, speech and thought processing grossly intact  ASSESSMENT AND PLAN:  Discussed the following assessment and plan:   DM (diabetes mellitus), type 2 with renal complications (HCC) Based on BS's DM II seems to be well controlled. We will stop Novolin 5 U TID and continue Lantus 80 U daily. Continue following dietary recommendations. Periodic eye exam and adequate foot care. Educated about risks for hypoglycemia. F/U in 4 months.  Insomnia Problem is well controlled with Trazodone 100 mg at bedtime. No changes in current management. Good sleep hygiene.  Hypertension with heart disease BP mildly elevated today. He is not sure if BP monitor is accurate. For now no changes in current medications. Continue low-salt diet. We discussed possible complications of  elevated BPs.  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HCC) Problem is stable with Prozac 20 mg. Some side effects discussed as well as the risk of interaction with some of his other meds. No changes in current management.  CKD (chronic kidney disease), stage III ([place]) Problem has been stable. Cr 1.4-1.5, e GFR high 40's Continue Losartan. Avoid NSAID's and continue low salt diet. Adequate hydration,BP and glucose control also recommended.  Peripheral neuropathy He is not taking Gabapentin, will not resume for now unless he requested medication later. Good foot care,avoidance of trauma. Adequate glucose controlled.     I discussed the assessment and treatment plan with the patient. He was provided an opportunity to ask questions and all were answered. He agreed with the plan and demonstrated an understanding of the instructions.   Because current public health concerns about COVID-19 pandemia and stay home recommendations,we ar opting to hold for lab work until next [place].  Return in about 4 months (around 08/01/2019) for F/U.    Emmary Culbreath [name], MD

## 2019-04-01 NOTE — Assessment & Plan Note (Signed)
Problem is well controlled with Trazodone 100 mg at bedtime. No changes in current management. Good sleep hygiene.

## 2019-04-01 NOTE — Assessment & Plan Note (Addendum)
Based on BS's DM II seems to be well controlled. We will stop Novolin 5 U TID and continue Lantus 80 U daily. Continue following dietary recommendations. Periodic eye exam and adequate foot care. Educated about risks for hypoglycemia. F/U in 4 months.

## 2019-04-02 NOTE — Assessment & Plan Note (Addendum)
Problem has been stable. Cr 1.4-1.5, e GFR high 40's Continue Losartan. Avoid NSAID's and continue low salt diet. Adequate hydration,BP and glucose control also recommended.

## 2019-04-02 NOTE — Assessment & Plan Note (Signed)
He is not taking Gabapentin, will not resume for now unless he requested medication later. Good foot care,avoidance of trauma. Adequate glucose controlled.

## 2019-04-02 NOTE — Assessment & Plan Note (Signed)
Problem is stable with Prozac 20 mg. Some side effects discussed as well as the risk of interaction with some of his other meds. No changes in current management.

## 2019-06-13 DIAGNOSIS — E119 Type 2 diabetes mellitus without complications: Secondary | ICD-10-CM | POA: Diagnosis not present

## 2019-06-13 DIAGNOSIS — H16223 Keratoconjunctivitis sicca, not specified as Sjogren's, bilateral: Secondary | ICD-10-CM | POA: Diagnosis not present

## 2019-06-13 LAB — HM DIABETES EYE EXAM

## 2019-08-31 ENCOUNTER — Ambulatory Visit (INDEPENDENT_AMBULATORY_CARE_PROVIDER_SITE_OTHER): Payer: Medicare Other | Admitting: Family Medicine

## 2019-08-31 ENCOUNTER — Encounter: Payer: Self-pay | Admitting: Family Medicine

## 2019-08-31 VITALS — BP 126/74 | HR 63 | Temp 97.5°F | Resp 16 | Ht 71.0 in | Wt 289.5 lb

## 2019-08-31 DIAGNOSIS — Z23 Encounter for immunization: Secondary | ICD-10-CM

## 2019-08-31 DIAGNOSIS — N183 Chronic kidney disease, stage 3 unspecified: Secondary | ICD-10-CM

## 2019-08-31 DIAGNOSIS — L989 Disorder of the skin and subcutaneous tissue, unspecified: Secondary | ICD-10-CM

## 2019-08-31 DIAGNOSIS — E1122 Type 2 diabetes mellitus with diabetic chronic kidney disease: Secondary | ICD-10-CM

## 2019-08-31 DIAGNOSIS — H6121 Impacted cerumen, right ear: Secondary | ICD-10-CM | POA: Diagnosis not present

## 2019-08-31 DIAGNOSIS — H9201 Otalgia, right ear: Secondary | ICD-10-CM | POA: Diagnosis not present

## 2019-08-31 DIAGNOSIS — H9192 Unspecified hearing loss, left ear: Secondary | ICD-10-CM

## 2019-08-31 DIAGNOSIS — Z0000 Encounter for general adult medical examination without abnormal findings: Secondary | ICD-10-CM

## 2019-08-31 DIAGNOSIS — Z794 Long term (current) use of insulin: Secondary | ICD-10-CM | POA: Diagnosis not present

## 2019-08-31 DIAGNOSIS — I119 Hypertensive heart disease without heart failure: Secondary | ICD-10-CM | POA: Diagnosis not present

## 2019-08-31 LAB — MICROALBUMIN / CREATININE URINE RATIO
Creatinine,U: 124.1 mg/dL
Microalb Creat Ratio: 1.9 mg/g (ref 0.0–30.0)
Microalb, Ur: 2.3 mg/dL — ABNORMAL HIGH (ref 0.0–1.9)

## 2019-08-31 LAB — BASIC METABOLIC PANEL
BUN: 20 mg/dL (ref 6–23)
CO2: 31 mEq/L (ref 19–32)
Calcium: 9.8 mg/dL (ref 8.4–10.5)
Chloride: 103 mEq/L (ref 96–112)
Creatinine, Ser: 1.55 mg/dL — ABNORMAL HIGH (ref 0.40–1.50)
GFR: 43.95 mL/min — ABNORMAL LOW (ref >60.00)
Glucose, Bld: 135 mg/dL — ABNORMAL HIGH (ref 70–99)
Potassium: 4.3 mEq/L (ref 3.5–5.1)
Sodium: 142 mEq/L (ref 135–145)

## 2019-08-31 LAB — HEMOGLOBIN A1C: Hgb A1c MFr Bld: 6.3 % (ref 4.6–6.5)

## 2019-08-31 NOTE — Progress Notes (Signed)
HPI:  Chief Complaint  Patient presents with  . Right ear pain    started last week  . Medicare Wellness  . Follow-up    [name] is a 73 y.o. male, who is here today with his wife complaining of right ear ache and constant bilateral fullness sensation. He has been applying old prescription for otic drops, which has helped. History of hearing loss. He has had ear lavage in the past due to similar symptoms, 10/2018. Denies associated fever,chill,or sore throat.  No history of trauma. He has not noted ear drainage.  Negative for recent URI or travel. Problem has improved.  He is due for AWV and has not follow on chronic health problems since 04/01/2019. He lives with his wife and son. He does not exercise regularly and has not been consistent with following a healthful diet. . Independent for most ADL's and IADL's. Unstable gait,he uses a cane. He needs help managing money, his wife manages their finances. Former smoker: From 64 to 1976.  Hx of peripheral neuropathy,unstable gait, generalized OA,DM II,CKD,insomnia,CAD,depression,HLD,and HTN.   Functional Status Survey: Is the patient deaf or have difficulty hearing?: Yes Does the patient have difficulty seeing, even when wearing glasses/contacts?: No Does the patient have difficulty concentrating, remembering, or making decisions?: Yes(sometimes) Does the patient have difficulty walking or climbing stairs?: Yes Does the patient have difficulty dressing or bathing?: No Does the patient have difficulty doing errands alone such as visiting a doctor's office or shopping?: No  Fall Risk  08/31/2019  Falls in the past year? 1  Number falls in past yr: 0  Injury with Fall? 0  Risk for fall due to : Impaired balance/gait;Orthopedic patient;Impaired mobility  Follow up Education provided   Providers he sees regularly: Eye care provider: [facility] [place]. Keratoconjunctivitis sicca and  presbyopia. Dr [name]. Urologist: Dr [name]. Hx of prostate cancer, s/p radiotherapy treatment. Last f/u 12/2018, an nual follow ups. Cardiologist: Dr [name].  Depression screen PHQ 2/9 08/31/2019  Decreased Interest 0  Down, Depressed, Hopeless 0  PHQ - 2 Score 0   Insomnia and depression: He is on Floxetine 20 mg and Trazodone 100 mg at bedtime.Sleeps about 7-8 hours. He has taken medications for years and denies side effect.    Hearing Screening   125Hz  250Hz  500Hz  1000Hz  2000Hz  3000Hz  4000Hz  6000Hz  8000Hz   Right ear:           Left ear:           Vision Screening Comments: Refused.   HTN: Currently he is on losartan 100 mg daily,HCTZ 25 mg daily, and metoprolol tartrate 25 mg twice daily. + CAD. Denies severe/frequent headache, visual changes, chest pain, dyspnea, palpitation, or edema.   Lab Results  Component Value Date   CREATININE 1.56 (H) 11/30/2018   BUN 20 11/30/2018   NA 140 11/30/2018   K 3.7 11/30/2018   CL 102 11/30/2018   CO2 29 11/30/2018   Hyperlipidemia, currently he is on simvastatin 40 mg daily.  DM II: Denies polydipsia,polyuria, or polyphagia. BS's 90-120. He denies hypoglycemic events. Currently he is on Lantus 80 units daily. Novolin was discontinued last visit.  LLE numbness and tingling stable.  Lab Results  Component Value Date   MICROALBUR 5.7 (H) 11/30/2018    Lab Results  Component Value Date   HGBA1C 5.5 11/30/2018   Requesting referral to dermatologist. Skin lesions he has had for a while. Not pruritic or tender. He  is not sure about growth. He has had a few skin lesions excide but not sure about type of lesions.   Review of Systems  Constitutional: Negative for activity change, appetite change and fatigue.  HENT: Negative for mouth sores and nosebleeds.   Eyes: Negative for pain and redness.  Respiratory: Negative for cough and wheezing.   Cardiovascular: Negative for leg swelling.  Gastrointestinal: Negative for  abdominal pain, nausea and vomiting.  Endocrine: Negative for cold intolerance and heat intolerance.  Genitourinary: Negative for decreased urine volume, dysuria and hematuria.  Musculoskeletal: Positive for gait problem.  Neurological: Negative for syncope and facial asymmetry.  Rest see pertinent positives and negatives per HPI.   Current Outpatient Medications on File Prior to Visit  Medication Sig Dispense Refill  . aspirin 81 MG chewable tablet Chew by mouth daily.    . Calcium Carbonate (CALCIUM 600 PO) Take by mouth daily.    . CORTISPORIN-TC 3.02-14-09-0.5 MG/ML OTIC suspension     . FLUoxetine (PROZAC) 40 MG capsule Take 40 mg by mouth daily.    . hydrochlorothiazide (HYDRODIURIL) 25 MG tablet Take 25 mg by mouth daily.    . insulin glargine (LANTUS) 100 UNIT/ML injection Inject 90 Units into the skin daily.    . isosorbide mononitrate (IMDUR) 30 MG 24 hr tablet Take 30 mg by mouth daily.    Marland Kitchen losartan (COZAAR) 100 MG tablet Take 100 mg by mouth daily.    . metoprolol tartrate (LOPRESSOR) 25 MG tablet Take 25 mg by mouth 2 (two) times daily.    . Multiple Vitamin (MULTI-VITAMINS) TABS Take by mouth.    . nitroGLYCERIN (NITROSTAT) 0.4 MG SL tablet Place 0.4 mg under the tongue every 5 (five) minutes as needed for chest pain.    . ranitidine (ZANTAC) 150 MG capsule Take 150 mg by mouth 2 (two) times daily.    . simvastatin (ZOCOR) 40 MG tablet Take 40 mg by mouth daily.    . tamsulosin (FLOMAX) 0.4 MG CAPS capsule Take 0.4 mg by mouth.    . traZODone (DESYREL) 100 MG tablet Take 100 mg by mouth at bedtime.     No current facility-administered medications on file prior to visit.      Past Medical History:  Diagnosis Date  . Arthritis   . Cancer ([place])   . Chronic kidney disease   . Depression   . Diabetes mellitus without complication ([place])   . Frequent headaches   . History of chicken pox   . History of fainting spells of unknown cause   . Hyperlipidemia   . Hypertension    . PTSD (post-traumatic stress disorder)    No Known Allergies  Social History   Socioeconomic History  . Marital status: Married    Spouse name: Not on file  . Number of children: 2  . Years of education: Not on file  . Highest education level: Not on file  Occupational History  . Occupation: retired  Scientific laboratory technician  . Financial resource strain: Not on file  . Food insecurity    Worry: Not on file    Inability: Not on file  . Transportation needs    Medical: Not on file    Non-medical: Not on file  Tobacco Use  . Smoking status: Former Smoker    Quit date: 12/14/1975    Years since quitting: 43.7  . Smokeless tobacco: Never Used  Substance and Sexual Activity  . Alcohol use: Not Currently  . Drug use: Never  .  Sexual activity: Not Currently  Lifestyle  . Physical activity    Days per week: Not on file    Minutes per session: Not on file  . Stress: Not on file  Relationships  . Social Herbalist on phone: Not on file    Gets together: Not on file    Attends religious service: Not on file    Active member of club or organization: Not on file    Attends meetings of clubs or organizations: Not on file    Relationship status: Not on file  Other Topics Concern  . Not on file  Social History Narrative  . Not on file    Vitals:   08/31/19 1110  BP: 126/74  Pulse: 63  Resp: 16  Temp: (!) 97.5 F (36.4 C)  SpO2: 96%   Body mass index is 40.38 kg/m.  Physical Exam  Nursing note reviewed. Constitutional: He is oriented to person, place, and time. He appears well-developed. No distress.  HENT:  Head: Normocephalic and atraumatic.  Right Ear: External ear normal. Decreased hearing is noted.  Left Ear: External ear normal. Tympanic membrane is not injected, not erythematous and not bulging. Decreased hearing is noted.  Mouth/Throat: Oropharynx is clear and moist and mucous membranes are normal.  Right ear canal with cerumen excess,cannot see TM.   Eyes: Pupils are equal, round, and reactive to light. Conjunctivae are normal.  Neck:    Cardiovascular: Normal rate and regular rhythm.  No murmur heard. Pulses:      Dorsalis pedis pulses are 2+ on the right side and 2+ on the left side.  Respiratory: Effort normal and breath sounds normal. No respiratory distress.  Musculoskeletal:        General: Edema present.       Hands:  Lymphadenopathy:    He has no cervical adenopathy.  Neurological: He is alert and oriented to person, place, and time. He has normal strength. No cranial nerve deficit. Gait (unstable, assisted with cane,) abnormal.  Skin: Skin is warm. No rash noted. No erythema.  Lateral right-sided on neck a 1.5 cm nodular lesion,whitish crust. Left hand: Dorsum with white crusty lesion,no tender.  See graphics.  Psychiatric: He has a normal mood and affect.  Well groomed, good eye contact.    ASSESSMENT AND PLAN:  Mr. Sayed was seen today for right ear pain, medicare wellness and follow-up.  Diagnoses and all orders for this visit:  Lab Results  Component Value Date   HGBA1C 6.3 08/31/2019   Lab Results  Component Value Date   CREATININE 1.55 (H) 08/31/2019   BUN 20 08/31/2019   NA 142 08/31/2019   K 4.3 08/31/2019   CL 103 08/31/2019   CO2 31 08/31/2019   Lab Results  Component Value Date   MICROALBUR 2.3 (H) 08/31/2019    Medicare annual wellness visit, subsequent We discussed the importance of staying active, physically as tolerated and mentally, as well as the benefits of a healthy/balance diet. Low impact exercise that involve stretching and strengthing are ideal. Vaccines up to date. We discussed preventive screening for the next 5-10 years, summery of recommendations given in AVS Fall prevention. Colonoscopy in 2015.  Advance directives and end of life discussed, he has POA and living will.    Type 2 diabetes mellitus with stage 3 chronic kidney disease, with long-term current use of  insulin ([place]) HgA1C was at goal. No changes in current management, lantus will be adjusted according to A1C.  Regular physical activity as tolerated and healthy diet with avoidance of added sugar food intake is an important part of treatment and recommended. Annual eye exam, periodic dental and foot care recommended. F/U in 5-6 months  -     Basic metabolic panel -     Hemoglobin A1c -     Microalbumin / creatinine urine ratio  Hypertension with heart disease BP adequately controlled. No changes in current management. Eye exam is current. Monitor BP at home.  -     Basic metabolic panel  Hearing loss of right ear due to cerumen impaction Ear lavage was unsuccessful. Right hearing greatly improved after cerumen I removed with curette. He tolerated procedure well.  Earache on right Possible causes discussed, improved some with cerumen removal. 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  -     Ambulatory referral to Audiology  Need for immunization against influenza -     Flu Vaccine QUAD High Dose(Fluad)  Skin lesions ? SCC. Other possible etiologies discussed, Bx is needed.  -     Ambulatory referral to Dermatology   Return in about 6 months (around 02/28/2020) for f/u.    Bill Stilley G. [name], MD  [facility]. [place] office.

## 2019-08-31 NOTE — Patient Instructions (Addendum)
  [name] , Thank you for taking time to come for your Medicare Wellness Visit. I appreciate your ongoing commitment to your health goals. Please review the following plan we discussed and let me know if I can assist you in the future.   These are the goals we discussed: Goals   None     This is a list of the screening recommended for you and due dates:  Health Maintenance  Topic Date Due  .  Hepatitis C: One time screening is recommended by Center for Disease Control  (CDC) for  adults born from [birth year] through 1967.   1946-09-23  . Pneumonia vaccines (1 of 2 - PCV13) 10/25/2009  . Hemoglobin A1C  06/01/2019  . Flu Shot  07/16/2019  . Complete foot exam   12/01/2019  . Eye exam for diabetics  06/12/2020  . Colon Cancer Screening    . Tetanus Vaccine  Discontinued  A few things to remember from today's visit:   Type 2 diabetes mellitus with stage 3 chronic kidney disease, with long-term current use of insulin ([place]) - Plan: Basic metabolic panel, Hemoglobin A1c, Microalbumin / creatinine urine ratio  Hypertension with heart disease - Plan: Basic metabolic panel  Hearing loss of right ear due to cerumen impaction  Earache on right  Medicare annual wellness visit, subsequent  Hearing loss of left ear, unspecified hearing loss type - Plan: Ambulatory referral to Audiology  A few tips:  -As we age balance is not as good as it was, so there is a higher risks for falls. Please remove small rugs and furniture that is "in your way" and could increase the risk of falls. Stretching exercises may help with fall prevention: Yoga and Tai Chi are some examples. Low impact exercise is better, so you are not very achy the next day.  -Sun screen and avoidance of direct sun light recommended. Caution with dehydration, if working outdoors be sure to drink enough fluids.  - Some medications are not safe as we age, increases the risk of side effects and can potentially interact with  other medication you are also taken;  including some of over the counter medications. Be sure to let me know when you start a new medication even if it is a dietary/vitamin supplement.   -Healthy diet low in red meet/animal fat and sugar + regular physical activity is recommended.       Screening schedule for the next 5-10 years:  Colonoscopy Glaucoma screening/eye exam every 1-2 years. Flu vaccine annually. Fall prevention

## 2019-09-03 MED ORDER — INSULIN GLARGINE 100 UNIT/ML ~~LOC~~ SOLN: 75.0000 [IU] | Freq: Every day | SUBCUTANEOUS | 1 refills | Status: DC

## 2019-09-21 ENCOUNTER — Other Ambulatory Visit: Payer: Self-pay

## 2019-09-21 ENCOUNTER — Ambulatory Visit (INDEPENDENT_AMBULATORY_CARE_PROVIDER_SITE_OTHER): Payer: Medicare Other | Admitting: Family Medicine

## 2019-09-21 ENCOUNTER — Encounter: Payer: Self-pay | Admitting: Family Medicine

## 2019-09-21 VITALS — BP 120/62 | HR 66 | Temp 98.1°F | Resp 16 | Ht 71.0 in | Wt 289.2 lb

## 2019-09-21 DIAGNOSIS — Z23 Encounter for immunization: Secondary | ICD-10-CM

## 2019-09-21 DIAGNOSIS — N1831 Chronic kidney disease, stage 3a: Secondary | ICD-10-CM | POA: Diagnosis not present

## 2019-09-21 DIAGNOSIS — R42 Dizziness and giddiness: Secondary | ICD-10-CM | POA: Diagnosis not present

## 2019-09-21 DIAGNOSIS — Z794 Long term (current) use of insulin: Secondary | ICD-10-CM

## 2019-09-21 DIAGNOSIS — E1140 Type 2 diabetes mellitus with diabetic neuropathy, unspecified: Secondary | ICD-10-CM

## 2019-09-21 DIAGNOSIS — H9191 Unspecified hearing loss, right ear: Secondary | ICD-10-CM | POA: Diagnosis not present

## 2019-09-21 DIAGNOSIS — I119 Hypertensive heart disease without heart failure: Secondary | ICD-10-CM | POA: Diagnosis not present

## 2019-09-21 NOTE — Progress Notes (Signed)
HPI:   [name] is a 73 y.o. male, who is here today with his wife for CPE, his wife thinks MedicareTricare covers an annual physical but not certain.  I last saw him on 08/31/19 for his AWV.  No new problems since his last OV.  DM II has been well controlled. Last visit Lantus was decreased from 80 U to 75 U. BS's still in the low 100's, this morning it was 154, which he says if high for him. Negative for polydipsia,polyuria, or polyphagia.  Lab Results  Component Value Date   HGBA1C 6.3 08/31/2019   CKD III: He has not noted gross hematuria,decreased urine output,or foam in urine.  HTN has been adequately controlled. Taking HCTZ 25 mg daily,Imdur 30 mg,and losartan 100 mg.  CAD:He has an appt with cardiologist next week. Negative for headache,visual changes,CP,SOB, or worsening edema.  Lab Results  Component Value Date   CREATININE 1.55 (H) 08/31/2019   BUN 20 08/31/2019   NA 142 08/31/2019   K 4.3 08/31/2019   CL 103 08/31/2019   CO2 31 08/31/2019   He stopped eating candy and has noted wt loss,he feels much better. Wt at home this morning 280 Lb.  He is not able to exercise regularly because chronic pain/arthralgias and neuropathy.  Requesting ENT referral. C/O dry nose,occasional bleeding ,exacerbated by picking at his nose. Also started having right ear discomfort and hearing loss. He has an appt with audiologist next week.  He has not noted ear drainage or earache. Hx of cerumen impaction, last visit he felt better after cerumen removal. No hx of trauma.  His wife also mentions dizziness, which he has had for "a while." It is exacerbated by getting up fast and last a few minutes. No associated symptoms. It has been stable.  Review of Systems  Constitutional: Negative for activity change, appetite change, diaphoresis, fatigue and fever.  HENT: Negative for congestion, nosebleeds, rhinorrhea and sore throat.   Eyes: Negative for  redness and visual disturbance.  Respiratory: Negative for cough and wheezing.   Cardiovascular: Negative for palpitations.  Gastrointestinal: Negative for abdominal pain, nausea and vomiting.  Musculoskeletal: Positive for arthralgias, back pain and gait problem.  Skin: Negative for rash and wound.  Neurological: Negative for syncope, facial asymmetry and weakness.  Rest see pertinent positives and negatives per HPI.   Current Outpatient Medications on File Prior to Visit  Medication Sig Dispense Refill  . aspirin 81 MG chewable tablet Chew by mouth daily.    . Calcium Carbonate (CALCIUM 600 PO) Take by mouth daily.    . CORTISPORIN-TC 3.02-14-09-0.5 MG/ML OTIC suspension     . FLUoxetine (PROZAC) 40 MG capsule Take 40 mg by mouth daily.    . hydrochlorothiazide (HYDRODIURIL) 25 MG tablet Take 25 mg by mouth daily.    . insulin glargine (LANTUS) 100 UNIT/ML injection Inject 0.75 mLs (75 Units total) into the skin daily. 10 mL 1  . isosorbide mononitrate (IMDUR) 30 MG 24 hr tablet Take 30 mg by mouth daily.    Marland Kitchen losartan (COZAAR) 100 MG tablet Take 100 mg by mouth daily.    . metoprolol tartrate (LOPRESSOR) 25 MG tablet Take 25 mg by mouth 2 (two) times daily.    . Multiple Vitamin (MULTI-VITAMINS) TABS Take by mouth.    . nitroGLYCERIN (NITROSTAT) 0.4 MG SL tablet Place 0.4 mg under the tongue every 5 (five) minutes as needed for chest pain.    . ranitidine (ZANTAC)  150 MG capsule Take 150 mg by mouth 2 (two) times daily.    . simvastatin (ZOCOR) 40 MG tablet Take 40 mg by mouth daily.    . tamsulosin (FLOMAX) 0.4 MG CAPS capsule Take 0.4 mg by mouth.    . traZODone (DESYREL) 100 MG tablet Take 100 mg by mouth at bedtime.     No current facility-administered medications on file prior to visit.    Past Medical History:  Diagnosis Date  . Arthritis   . Cancer ([place])   . Chronic kidney disease   . Depression   . Diabetes mellitus without complication ([place])   . Frequent headaches   .  History of chicken pox   . History of fainting spells of unknown cause   . Hyperlipidemia   . Hypertension   . PTSD (post-traumatic stress disorder)    No Known Allergies  Social History   Socioeconomic History  . Marital status: Married    Spouse name: Not on file  . Number of children: 2  . Years of education: Not on file  . Highest education level: Not on file  Occupational History  . Occupation: retired  Scientific laboratory technician  . Financial resource strain: Not on file  . Food insecurity    Worry: Not on file    Inability: Not on file  . Transportation needs    Medical: Not on file    Non-medical: Not on file  Tobacco Use  . Smoking status: Former Smoker    Quit date: 12/14/1975    Years since quitting: 43.8  . Smokeless tobacco: Never Used  Substance and Sexual Activity  . Alcohol use: Not Currently  . Drug use: Never  . Sexual activity: Not Currently  Lifestyle  . Physical activity    Days per week: Not on file    Minutes per session: Not on file  . Stress: Not on file  Relationships  . Social Herbalist on phone: Not on file    Gets together: Not on file    Attends religious service: Not on file    Active member of club or organization: Not on file    Attends meetings of clubs or organizations: Not on file    Relationship status: Not on file  Other Topics Concern  . Not on file  Social History Narrative  . Not on file    Vitals:   09/21/19 1034  BP: 120/62  Pulse: 66  Resp: 16  Temp: 98.1 F (36.7 C)  SpO2: 97%   Body mass index is 40.34 kg/m.  Wt Readings from Last 3 Encounters:  09/21/19 289 lb 4 oz (131.2 kg)  08/31/19 289 lb 8 oz (131.3 kg)  12/13/18 291 lb 9.6 oz (132.3 kg)     Physical Exam  Nursing note and vitals reviewed. Constitutional: He is oriented to person, place, and time. He appears well-developed. No distress.  HENT:  Head: Normocephalic and atraumatic.  Right Ear: External ear normal.  Left Ear: Tympanic membrane,  external ear and ear canal normal.  Mouth/Throat: Oropharynx is clear and moist and mucous membranes are normal.  Right cerumen excess, can not see TM  Eyes: Pupils are equal, round, and reactive to light. Conjunctivae are normal.  Cardiovascular: Normal rate and regular rhythm.  No murmur heard. DP pulses present.  Respiratory: Effort normal and breath sounds normal. No respiratory distress.  GI: Soft. He exhibits no mass. There is no hepatomegaly. There is no abdominal tenderness.  Musculoskeletal:  General: Edema (Trace pitting LE edema, bilateral.) present.  Lymphadenopathy:    He has no cervical adenopathy.  Neurological: He is alert and oriented to person, place, and time. He has normal strength. No cranial nerve deficit.  Gait assisted with a cane.  Skin: Skin is warm. No rash noted. No erythema.  Psychiatric: He has a normal mood and affect.  Well groomed, good eye contact.    ASSESSMENT AND PLAN:  Mr. Zae was seen today for annual exam.  Diagnoses and all orders for this visit:  Hearing loss of right ear, unspecified hearing loss type Recurrent right cerumen impaction could be aggravating problem. No Q tips.  He has an appt with audiologist next week. ENT referral placed.  -     Ambulatory referral to ENT  Stage 3a chronic kidney disease Low salt diet,avoid NSAID's. Adequate hydration. Good glucose and BP controlled.  Need for 23-polyvalent pneumococcal polysaccharide vaccine -     Pneumococcal polysaccharide vaccine 23-valent greater than or equal to 2yo subcutaneous/IM  Hypertension with heart disease BP adequately controlled. No changes in current management. Recommend monitoring BP at home. Continue low salt diet.  Type 2 diabetes mellitus with diabetic neuropathy, with long-term current use of insulin ([place]) Still well controlled. No changes in current management. Eye exam is current. Appropriate foot care discussed.  Dizziness, nonspecific  Possible etiologies discussed. It is a chronic problem and stable. A few of his medications can be contributing to problem. Fall precautions. Instructed about warning signs.  Return in 6 months (on 03/21/2020).   -Mr. Doristine Mango was advised to return sooner than planned today if new concerns arise.    Arfa Lamarca G. [name], MD  [facility] [place] office.

## 2019-09-21 NOTE — Patient Instructions (Signed)
  At least 150 minutes of moderate exercise per week, daily brisk walking for 15-30 min is a good exercise option. Healthy diet low in saturated (animal) fats and sweets and consisting of fresh fruits and vegetables, lean meats such as fish and white chicken and whole grains.  - Vaccines:  Tdap vaccine every 10 years.  Shingles vaccine recommended at age 75, could be given after 75 years of age but not sure about insurance coverage.  Pneumonia vaccines:  Pneumovax at 17.Given today.   -Screening recommendations for low/normal risk males:  Colon cancer screening at age 27 and until age 29. Also recommended:  1. Dental visit- Brush and floss your teeth twice daily; visit your dentist twice a year. 2. Eye doctor- Get an eye exam at least every 2 years. 3. Helmet use- Always wear a helmet when riding a bicycle, motorcycle, rollerblading or skateboarding. 4. Safe sex- If you may be exposed to sexually transmitted infections, use a condom. 5. Seat belts- Seat belts can save your live; always wear one. 6. Smoke/Carbon Monoxide detectors- These detectors need to be installed on the appropriate level of your home. Replace batteries at least once a year. 7. Skin cancer- When out in the sun please cover up and use sunscreen 15 SPF or higher. 8. Violence- If anyone is threatening or hurting you, please tell your healthcare provider.  9. Drink alcohol in moderation- Limit alcohol intake to one drink or less per day. Never drink and drive.  A few tips:  -As we age balance is not as good as it was, so there is a higher risks for falls. Please remove small rugs and furniture that is "in your way" and could increase the risk of falls. Stretching exercises may help with fall prevention: Yoga and Tai Chi are some examples. Low impact exercise is better, so you are not very achy the next day.  -Sun screen and avoidance of direct sun light recommended. Caution with dehydration, if working outdoors be  sure to drink enough fluids.  - Some medications are not safe as we age, increases the risk of side effects and can potentially interact with other medication you are also taken;  including some of over the counter medications. Be sure to let me know when you start a new medication even if it is a dietary/vitamin supplement.   -Healthy diet low in red meet/animal fat and sugar + regular physical activity is recommended.

## 2019-09-29 DIAGNOSIS — L570 Actinic keratosis: Secondary | ICD-10-CM | POA: Diagnosis not present

## 2019-09-29 DIAGNOSIS — L989 Disorder of the skin and subcutaneous tissue, unspecified: Secondary | ICD-10-CM | POA: Diagnosis not present

## 2019-09-29 DIAGNOSIS — L578 Other skin changes due to chronic exposure to nonionizing radiation: Secondary | ICD-10-CM | POA: Diagnosis not present

## 2019-09-29 DIAGNOSIS — Z1283 Encounter for screening for malignant neoplasm of skin: Secondary | ICD-10-CM | POA: Diagnosis not present

## 2019-10-04 DIAGNOSIS — R42 Dizziness and giddiness: Secondary | ICD-10-CM | POA: Diagnosis not present

## 2019-10-04 DIAGNOSIS — J310 Chronic rhinitis: Secondary | ICD-10-CM | POA: Diagnosis not present

## 2019-10-04 DIAGNOSIS — H6123 Impacted cerumen, bilateral: Secondary | ICD-10-CM | POA: Diagnosis not present

## 2019-10-04 DIAGNOSIS — H903 Sensorineural hearing loss, bilateral: Secondary | ICD-10-CM | POA: Diagnosis not present

## 2019-10-06 DIAGNOSIS — L821 Other seborrheic keratosis: Secondary | ICD-10-CM | POA: Diagnosis not present

## 2019-12-15 NOTE — Progress Notes (Signed)
Cardiology Office Note   Date:  12/20/2019   ID:  [name], [name] 1946-11-25, MRN [id number]  PCP:  [name], [name] G, MD  Cardiologist:   Peter [name], MD   Chief Complaint  Patient presents with  . Coronary Artery Disease      History of Present Illness: [name] is a 73 y.o. male who is seen at the request of Dr. [name] for evaluation of CAD. He has a history of DM on insulin, CKD, HTN and HLD. He has known CAD s/p CABG in 2004. Previously followed by Dr. Peterson Ao at [facility] in [place]. Last Cardiac cath in July 2019 for evaluation of chest pain showed 90% proximal LAD, occluded nondominant LCx and occluded RCA. The grafts were patent including LIMA to the LAD, SVG to RCA and SVG to ramus with 40-50% proximal stenosis. Normal EF and EDP. 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   He  moved from [place] to be close to family. 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 He is married. He is very sedentary. Notes his legs get weak and he uses a cane to walk.  Has had some falls in the past. Denies any anginal symptoms. Does complain of his feet being cold and stinging.     Past Medical History:  Diagnosis Date  . Arthritis   . Cancer ([place])   . Chronic kidney disease   . Depression   . Diabetes mellitus without complication ([place])   . Frequent headaches   . History of chicken pox   . History of fainting spells of unknown cause   . Hyperlipidemia   . Hypertension   . PTSD (post-traumatic stress disorder)     Past Surgical History:  Procedure Laterality Date  . CARDIAC SURGERY     Triple Bypass  . CHOLECYSTECTOMY  2010  . CORONARY ARTERY BYPASS GRAFT  2004  . TONSILLECTOMY  1958     Current Outpatient Medications  Medication Sig Dispense Refill  . aspirin 81 MG chewable tablet Chew by mouth daily.    . Calcium Carbonate (CALCIUM 600 PO) Take by mouth daily.    .  CORTISPORIN-TC 3.02-14-09-0.5 MG/ML OTIC suspension     . FLUoxetine (PROZAC) 40 MG capsule Take 40 mg by mouth daily.    . hydrochlorothiazide (HYDRODIURIL) 25 MG tablet Take 25 mg by mouth daily.    . insulin glargine (LANTUS) 100 UNIT/ML injection Inject 0.75 mLs (75 Units total) into the skin daily. 10 mL 1  . isosorbide mononitrate (IMDUR) 30 MG 24 hr tablet Take 30 mg by mouth daily.    Marland Kitchen losartan (COZAAR) 100 MG tablet Take 100 mg by mouth daily.    . metoprolol tartrate (LOPRESSOR) 25 MG tablet Take 25 mg by mouth 2 (two) times daily.    . Multiple Vitamin (MULTI-VITAMINS) TABS Take by mouth.    . nitroGLYCERIN (NITROSTAT) 0.4 MG SL tablet Place 0.4 mg under the tongue every 5 (five) minutes as needed for chest pain.    . ranitidine (ZANTAC) 150 MG capsule Take 150 mg by mouth 2 (two) times daily.    . simvastatin (ZOCOR) 40 MG tablet Take 40 mg by mouth daily.    . tamsulosin (FLOMAX) 0.4 MG CAPS capsule Take 0.4 mg by mouth.    . traZODone (DESYREL) 100 MG tablet Take 100 mg by mouth at bedtime.  No current facility-administered medications for this visit.    Allergies:   Patient has no known allergies.    Social History:  The patient  reports that he quit smoking about 44 years ago. He has never used smokeless tobacco. He reports previous alcohol use. He reports that he does not use drugs.   Family History:  The patient's family history includes Alcohol abuse in his brother; Arthritis in his mother and sister; COPD in his brother; Cancer in his brother, mother, and sister; Depression in his daughter; Heart attack in his brother, brother, and mother; Heart disease in his brother, brother, and mother; Hyperlipidemia in his mother; Hypertension in his mother; Learning disabilities in his brother.    ROS:  Please see the history of present illness.   Otherwise, review of systems are positive for none.   All other systems are reviewed and negative.    PHYSICAL EXAM: VS:  BP 107/71    Pulse 77   Temp (!) 97.5 F (36.4 C)   Ht 5\' 11"  (1.803 m)   Wt 290 lb (131.5 kg)   SpO2 97%   BMI 40.45 kg/m  , BMI Body mass index is 40.45 kg/m. GEN: Well nourished, obese, in no acute distress  HEENT: normal  Neck: no JVD, carotid bruits, or masses Cardiac: RRR; no murmurs, rubs, or gallops,no edema  Respiratory:  clear to auscultation bilaterally, normal work of breathing GI: soft, nontender, nondistended, + BS MS: no deformity or atrophy  Skin: warm and dry, no rash Neuro:  Strength and sensation are intact Psych: euthymic mood, full affect   EKG:  EKG is ordered today. The ekg ordered today demonstrates NSR with rate 77. Nonspecific ST abnormality. I have personally reviewed and interpreted this study.    Recent Labs: 08/31/2019: BUN 20; Creatinine, Ser 1.55; Potassium 4.3; Sodium 142 A1c 6.3%   Lipid Panel    Component Value Date/Time   CHOL 101 12/16/2018 1003   TRIG 122 12/16/2018 1003   HDL 38 (L) 12/16/2018 1003   CHOLHDL 2.7 12/16/2018 1003   LDLCALC 39 12/16/2018 1003      Wt Readings from Last 3 Encounters:  12/20/19 290 lb (131.5 kg)  09/21/19 289 lb 4 oz (131.2 kg)  08/31/19 289 lb 8 oz (131.3 kg)      Other studies Reviewed: Additional studies/ records that were reviewed today include:  Labs dated 06/26/17: Hgb 12.8. Creatinine 1.43. potassium 3.4.    ASSESSMENT AND PLAN:  1.  CAD s/p remote CABG in 2004. Cardiac cath in July 2019 showed patent grafts. He is asymptomatic.  Continue ASA, statin, Imdur, metoprolol. Follow up in one year 2. DM on insulin with complication of CKD and neuropathy 3. HLD on Zocor.  4. HTN controlled.  5. PTSD   Current medicines are reviewed at length with the patient today.  The patient does not have concerns regarding medicines.  The following changes have been made:  no change  Labs/ tests ordered today include:  No orders of the defined types were placed in this encounter.    Disposition:   FU  with me in 1 year  Signed, Peter [name], MD  12/20/2019 11:55 AM    [facility] [address] [place], [place], [address] Phone [phone], Fax [phone]

## 2019-12-19 DIAGNOSIS — C61 Malignant neoplasm of prostate: Secondary | ICD-10-CM | POA: Diagnosis not present

## 2019-12-19 DIAGNOSIS — E559 Vitamin D deficiency, unspecified: Secondary | ICD-10-CM | POA: Diagnosis not present

## 2019-12-19 DIAGNOSIS — M6218 Other rupture of muscle (nontraumatic), other site: Secondary | ICD-10-CM | POA: Diagnosis not present

## 2019-12-20 ENCOUNTER — Other Ambulatory Visit: Payer: Self-pay

## 2019-12-20 ENCOUNTER — Encounter: Payer: Self-pay | Admitting: Cardiology

## 2019-12-20 ENCOUNTER — Ambulatory Visit (INDEPENDENT_AMBULATORY_CARE_PROVIDER_SITE_OTHER): Payer: Medicare Other | Admitting: Cardiology

## 2019-12-20 VITALS — BP 107/71 | HR 77 | Temp 97.5°F | Ht 71.0 in | Wt 290.0 lb

## 2019-12-20 DIAGNOSIS — E118 Type 2 diabetes mellitus with unspecified complications: Secondary | ICD-10-CM

## 2019-12-20 DIAGNOSIS — I10 Essential (primary) hypertension: Secondary | ICD-10-CM

## 2019-12-20 DIAGNOSIS — I25810 Atherosclerosis of coronary artery bypass graft(s) without angina pectoris: Secondary | ICD-10-CM | POA: Diagnosis not present

## 2019-12-20 DIAGNOSIS — N1831 Chronic kidney disease, stage 3a: Secondary | ICD-10-CM | POA: Diagnosis not present

## 2019-12-20 DIAGNOSIS — Z794 Long term (current) use of insulin: Secondary | ICD-10-CM

## 2020-01-11 ENCOUNTER — Ambulatory Visit: Payer: Medicare Other

## 2020-01-19 ENCOUNTER — Ambulatory Visit: Payer: Medicare Other | Attending: Internal Medicine

## 2020-01-19 DIAGNOSIS — Z23 Encounter for immunization: Secondary | ICD-10-CM | POA: Insufficient documentation

## 2020-01-19 NOTE — Progress Notes (Signed)
   Covid-19 Vaccination Clinic  Name:  Hunt Waltzer    MRN: [id number] DOB: 04-14-1946  01/19/2020  Mr. Yoos was observed post Covid-19 immunization for 15 minutes without incidence. He was provided with Vaccine Information Sheet and instruction to access the V-Safe system.   Mr. Mantey was instructed to call 911 with any severe reactions post vaccine: Marland Kitchen Difficulty breathing  . Swelling of your face and throat  . A fast heartbeat  . A bad rash all over your body  . Dizziness and weakness    Immunizations Administered    Name Date Dose VIS Date Route   Pfizer COVID-19 Vaccine 01/19/2020  6:02 PM 0.3 mL 11/25/2019 Intramuscular   Manufacturer: [place]   Lot: [id number]   [place]: [id number]

## 2020-02-01 ENCOUNTER — Ambulatory Visit: Payer: Medicare Other

## 2020-02-14 ENCOUNTER — Ambulatory Visit: Payer: Medicare Other | Attending: Internal Medicine

## 2020-02-14 DIAGNOSIS — Z23 Encounter for immunization: Secondary | ICD-10-CM | POA: Insufficient documentation

## 2020-02-14 NOTE — Progress Notes (Signed)
   Covid-19 Vaccination Clinic  Name:  [name]    MRN: [id number] DOB: Mar 14, 1946  02/14/2020  [name] was observed post Covid-19 immunization for 15 minutes without incident. He was provided with Vaccine Information Sheet and instruction to access the V-Safe system.   [name] was instructed to call 911 with any severe reactions post vaccine: Marland Kitchen Difficulty breathing  . Swelling of face and throat  . A fast heartbeat  . A bad rash all over body  . Dizziness and weakness   Immunizations Administered    Name Date Dose VIS Date Route   Pfizer COVID-19 Vaccine 02/14/2020 10:59 AM 0.3 mL 11/25/2019 Intramuscular   Manufacturer: [place]   Lot: [id number]   [place]: [id number]

## 2020-02-27 ENCOUNTER — Other Ambulatory Visit: Payer: Self-pay

## 2020-02-28 ENCOUNTER — Ambulatory Visit (INDEPENDENT_AMBULATORY_CARE_PROVIDER_SITE_OTHER): Payer: Medicare Other | Admitting: Family Medicine

## 2020-02-28 ENCOUNTER — Encounter: Payer: Self-pay | Admitting: Family Medicine

## 2020-02-28 VITALS — BP 128/60 | HR 71 | Temp 96.2°F | Resp 16 | Ht 71.0 in | Wt 286.0 lb

## 2020-02-28 DIAGNOSIS — Z794 Long term (current) use of insulin: Secondary | ICD-10-CM

## 2020-02-28 DIAGNOSIS — I25708 Atherosclerosis of coronary artery bypass graft(s), unspecified, with other forms of angina pectoris: Secondary | ICD-10-CM | POA: Diagnosis not present

## 2020-02-28 DIAGNOSIS — I25810 Atherosclerosis of coronary artery bypass graft(s) without angina pectoris: Secondary | ICD-10-CM | POA: Diagnosis not present

## 2020-02-28 DIAGNOSIS — N1832 Chronic kidney disease, stage 3b: Secondary | ICD-10-CM | POA: Diagnosis not present

## 2020-02-28 DIAGNOSIS — Z951 Presence of aortocoronary bypass graft: Secondary | ICD-10-CM | POA: Insufficient documentation

## 2020-02-28 DIAGNOSIS — F324 Major depressive disorder, single episode, in partial remission: Secondary | ICD-10-CM | POA: Diagnosis not present

## 2020-02-28 DIAGNOSIS — G629 Polyneuropathy, unspecified: Secondary | ICD-10-CM

## 2020-02-28 DIAGNOSIS — E1122 Type 2 diabetes mellitus with diabetic chronic kidney disease: Secondary | ICD-10-CM | POA: Diagnosis not present

## 2020-02-28 DIAGNOSIS — I119 Hypertensive heart disease without heart failure: Secondary | ICD-10-CM | POA: Diagnosis not present

## 2020-02-28 DIAGNOSIS — N183 Chronic kidney disease, stage 3 unspecified: Secondary | ICD-10-CM

## 2020-02-28 LAB — POCT GLYCOSYLATED HEMOGLOBIN (HGB A1C): Hemoglobin A1C: 5.9 % — AB (ref 4.0–5.6)

## 2020-02-28 LAB — BASIC METABOLIC PANEL
BUN: 17 mg/dL (ref 6–23)
CO2: 29 mEq/L (ref 19–32)
Calcium: 9.2 mg/dL (ref 8.4–10.5)
Chloride: 101 mEq/L (ref 96–112)
Creatinine, Ser: 1.47 mg/dL (ref 0.40–1.50)
GFR: 46.66 mL/min — ABNORMAL LOW (ref >60.00)
Glucose, Bld: 232 mg/dL — ABNORMAL HIGH (ref 70–99)
Potassium: 3.7 mEq/L (ref 3.5–5.1)
Sodium: 137 mEq/L (ref 135–145)

## 2020-02-28 LAB — VITAMIN D 25 HYDROXY (VIT D DEFICIENCY, FRACTURES): VITD: 64.48 ng/mL (ref 30.00–100.00)

## 2020-02-28 NOTE — Assessment & Plan Note (Signed)
HgA1C at goal. Recommend decreasing Lantus from 75 units to 65 units, he is a little nervous about this, so he agrees to decrease it to 70 units. Regular exercise as tolerated and healthy diet with avoidance of added sugar food intake is an important part of treatment and recommended. Annual eye exam, periodic dental and foot care recommended. F/U in 5-6 months

## 2020-02-28 NOTE — Assessment & Plan Note (Signed)
He is not reporting symptoms. Following with cardiologist.

## 2020-02-28 NOTE — Assessment & Plan Note (Signed)
Problem has been stable. Continue adequate hydration and low-salt diet. No changes in losartan dose. Avoid NSAIDs. Adequate BP and glucose control.

## 2020-02-28 NOTE — Patient Instructions (Signed)
A few things to remember from today's visit:   Diabetes is very good. In order to avoid low blood sugars I would like to decrease Lantus from 75 to 65 U. Continue watching what you eat.  No changes in the rest of your medications.  Good foot care, 3 times per week check bottom with a mirror.  We will check cholesterol next visit.  Please be sure medication list is accurate. If a new problem present, please set up appointment sooner than planned today.

## 2020-02-28 NOTE — Assessment & Plan Note (Signed)
BP adequately controlled. No changes in current management. Continue low-salt diet. Eye exam is current. 

## 2020-02-28 NOTE — Assessment & Plan Note (Addendum)
We discussed the importance of injury prevention and monitoring feet periodically. Recommend checking bottom of feet 3 times per week, he can use a mirror. Adequate glucose control.

## 2020-02-28 NOTE — Progress Notes (Signed)
HPI:   Mr.Nikash Nijay Lansing is a 74 y.o. male, who is here today for chronic disease management.  He was last seen on 08/31/2019.  Hypertension: Currently he is on Imdur 30 mg, metoprolol tartrate 25 mg twice daily, losartan 100 mg daily, and HCTZ 25 mg daily. He is not checking BP regularly. Tolerating medications well. CKD III, he has not noted gross hematuria,decreased urine output,or foam in urine.  CAD, since his last visit he has followed-up with cardiologist, Dr. Peter [name]. Negative for chest pain,SOB,palpitations ,or worsening edema.  He is on Aspirin 81 mg daily and Atorvastatin 80 mg daily.  Lab Results  Component Value Date   CREATININE 1.55 (H) 08/31/2019   BUN 20 08/31/2019   NA 142 08/31/2019   K 4.3 08/31/2019   CL 103 08/31/2019   CO2 31 08/31/2019   DM2: Currently he is on Lantus 25 units daily. BS usually between 130-140. He has not had hypoglycemic events.  Negative for polydipsia,polyuria, or polyphagia.  + LE numbness and tingling and feet achy pain at night when in bed. No skin lesions/ulcers. Problem is stable. He tried Gabapentin 100 mg before but did not notice significant difference. Lab Results  Component Value Date   HGBA1C 6.3 08/31/2019   Lab Results  Component Value Date   MICROALBUR 2.3 (H) 08/31/2019   MICROALBUR 5.7 (H) 11/30/2018    Depression: Currently he is on fluoxetine 20 mg twice daily. Denies depressed mood.  He is also on trazodone 100 mg at bedtime to help with his sleep.  Tolerating meds well. He has been on both meds for years.  Review of Systems  Constitutional: Negative for activity change, appetite change, fatigue, fever and unexpected weight change.  HENT: Negative for nosebleeds, sore throat and trouble swallowing.   Eyes: Negative for redness and visual disturbance.  Respiratory: Negative for cough and wheezing.   Gastrointestinal: Negative for abdominal pain, nausea and vomiting.    Genitourinary: Negative for decreased urine volume and hematuria.  Neurological: Negative for syncope, weakness and headaches.  Rest of ROS, see pertinent positives sand negatives in HPI  Current Outpatient Medications on File Prior to Visit  Medication Sig Dispense Refill  . aspirin 81 MG chewable tablet Chew by mouth daily.    Marland Kitchen atorvastatin (LIPITOR) 80 MG tablet Take 40 mg by mouth daily.    . Calcium Carbonate (CALCIUM 600 PO) Take by mouth daily.    . CORTISPORIN-TC 3.02-14-09-0.5 MG/ML OTIC suspension     . FLUoxetine (PROZAC) 20 MG capsule Take 1 capsule by mouth in the morning and at bedtime.    . hydrochlorothiazide (HYDRODIURIL) 25 MG tablet Take 25 mg by mouth daily.    . insulin glargine (LANTUS) 100 UNIT/ML injection Inject 0.75 mLs (75 Units total) into the skin daily. 10 mL 1  . isosorbide mononitrate (IMDUR) 30 MG 24 hr tablet Take 30 mg by mouth daily.    Marland Kitchen losartan (COZAAR) 100 MG tablet Take 100 mg by mouth daily.    . metoprolol tartrate (LOPRESSOR) 25 MG tablet Take 25 mg by mouth 2 (two) times daily.    . Multiple Vitamin (MULTI-VITAMINS) TABS Take by mouth.    . nitroGLYCERIN (NITROSTAT) 0.4 MG SL tablet Place 0.4 mg under the tongue every 5 (five) minutes as needed for chest pain.    . ranitidine (ZANTAC) 150 MG capsule Take 150 mg by mouth 2 (two) times daily.    . simvastatin (ZOCOR) 40 MG tablet  Take 40 mg by mouth daily.    . tamsulosin (FLOMAX) 0.4 MG CAPS capsule Take 0.4 mg by mouth.    . traZODone (DESYREL) 100 MG tablet Take 100 mg by mouth at bedtime.     No current facility-administered medications on file prior to visit.   Past Medical History:  Diagnosis Date  . Arthritis   . Cancer ([place])   . Chronic kidney disease   . Depression   . Diabetes mellitus without complication ([place])   . Frequent headaches   . History of chicken pox   . History of fainting spells of unknown cause   . Hyperlipidemia   . Hypertension   . PTSD (post-traumatic stress  disorder)    No Known Allergies  Social History   Socioeconomic History  . Marital status: Married    Spouse name: Not on file  . Number of children: 2  . Years of education: Not on file  . Highest education level: Not on file  Occupational History  . Occupation: retired  Tobacco Use  . Smoking status: Former Smoker    Quit date: 12/14/1975    Years since quitting: 44.2  . Smokeless tobacco: Never Used  Substance and Sexual Activity  . Alcohol use: Not Currently  . Drug use: Never  . Sexual activity: Not Currently  Other Topics Concern  . Not on file  Social History Narrative  . Not on file   Social Determinants of Health   Financial Resource Strain:   . Difficulty of Paying Living Expenses:   Food Insecurity:   . Worried About Charity fundraiser in the Last Year:   . Arboriculturist in the Last Year:   Transportation Needs:   . Film/video editor (Medical):   Marland Kitchen Lack of Transportation (Non-Medical):   Physical Activity:   . Days of Exercise per Week:   . Minutes of Exercise per Session:   Stress:   . Feeling of Stress :   Social Connections:   . Frequency of Communication with Friends and Family:   . Frequency of Social Gatherings with Friends and Family:   . Attends Religious Services:   . Active Member of Clubs or Organizations:   . Attends Archivist Meetings:   Marland Kitchen Marital Status:     Vitals:   02/28/20 0947  BP: 128/60  Pulse: 71  Resp: 16  Temp: (!) 96.2 F (35.7 C)  SpO2: 97%   Body mass index is 39.89 kg/m.  Physical Exam  Nursing note and vitals reviewed. Constitutional: He is oriented to person, place, and time. He appears well-developed. No distress.  HENT:  Head: Normocephalic and atraumatic.  Mouth/Throat: Oropharynx is clear and moist and mucous membranes are normal.  Eyes: Pupils are equal, round, and reactive to light. Conjunctivae are normal.  Cardiovascular: Normal rate and regular rhythm.  No murmur  heard. Pulses:      Dorsalis pedis pulses are 2+ on the right side and 2+ on the left side.  Respiratory: Effort normal and breath sounds normal. No respiratory distress.  GI: Soft. He exhibits no mass. There is no hepatomegaly. There is no abdominal tenderness.  Musculoskeletal:        General: Edema (1+ pitting LE edema,bilateral) present.  Lymphadenopathy:    He has no cervical adenopathy.  Neurological: He is alert and oriented to person, place, and time. He has normal strength. No cranial nerve deficit.  Mildly unstable gait,assisted with cane.  Skin: Skin is  warm. No rash noted. No erythema.  Psychiatric: He has a normal mood and affect.  Well groomed, good eye contact.   ASSESSMENT AND PLAN:  Mr. Karle Masse was seen today for chronic disease management.  Orders Placed This Encounter  Procedures  . Basic metabolic panel  . Fructosamine  . VITAMIN D 25 Hydroxy (Vit-D Deficiency, Fractures)  . POC HgB A1c   Lab Results  Component Value Date   HGBA1C 5.9 (A) 02/28/2020   Lab Results  Component Value Date   CREATININE 1.47 02/28/2020   BUN 17 02/28/2020   NA 137 02/28/2020   K 3.7 02/28/2020   CL 101 02/28/2020   CO2 29 02/28/2020    CKD (chronic kidney disease), stage III ([place]) Problem has been stable. Continue adequate hydration and low-salt diet. No changes in losartan dose. Avoid NSAIDs. Adequate BP and glucose control.  Coronary artery disease of bypass graft of native heart with stable angina pectoris [facility]) He is not reporting symptoms. Following with cardiologist.  Hypertension with heart disease BP adequately controlled. No changes in current management. Continue low-salt diet. Eye exam is current.  DM (diabetes mellitus), type 2 with renal complications (HCC) 123456 at goal. Recommend decreasing Lantus from 75 units to 65 units, he is a little nervous about this, so he agrees to decrease it to 70 units. Regular exercise as tolerated and  healthy diet with avoidance of added sugar food intake is an important part of treatment and recommended. Annual eye exam, periodic dental and foot care recommended. F/U in 5-6 months   Peripheral neuropathy We discussed the importance of injury prevention and monitoring feet periodically. Recommend checking bottom of feet 3 times per week, he can use a mirror. Adequate glucose control.  Major depressive disorder in partial remission, unspecified whether recurrent ([place]) Problem is stable. No changes in Fluoxetine dose, 20 mg bid.   Return in about 6 months (around 08/31/2020) for AWV and f/u.   Ambrea Hegler G. [name], MD  [facility]. [place] office.  A few things to remember from today's visit:   Diabetes is very good. In order to avoid low blood sugars I would like to decrease Lantus from 75 to 65 U. Continue watching what you eat.  No changes in the rest of your medications.  Good foot care, 3 times per week check bottom with a mirror.  We will check cholesterol next visit.  Please be sure medication list is accurate. If a new problem present, please set up appointment sooner than planned today.

## 2020-03-03 ENCOUNTER — Encounter: Payer: Self-pay | Admitting: Family Medicine

## 2020-03-03 LAB — FRUCTOSAMINE: Fructosamine: 313 umol/L — ABNORMAL HIGH (ref 205–285)

## 2020-03-20 DIAGNOSIS — L821 Other seborrheic keratosis: Secondary | ICD-10-CM | POA: Diagnosis not present

## 2020-03-20 DIAGNOSIS — Z1283 Encounter for screening for malignant neoplasm of skin: Secondary | ICD-10-CM | POA: Diagnosis not present

## 2020-03-20 DIAGNOSIS — L578 Other skin changes due to chronic exposure to nonionizing radiation: Secondary | ICD-10-CM | POA: Diagnosis not present

## 2020-03-20 DIAGNOSIS — L570 Actinic keratosis: Secondary | ICD-10-CM | POA: Diagnosis not present

## 2020-09-03 ENCOUNTER — Ambulatory Visit: Payer: Medicare Other | Admitting: Family Medicine

## 2020-10-10 ENCOUNTER — Encounter: Payer: Self-pay | Admitting: Family Medicine

## 2020-10-10 ENCOUNTER — Ambulatory Visit (INDEPENDENT_AMBULATORY_CARE_PROVIDER_SITE_OTHER): Payer: Medicare Other | Admitting: Family Medicine

## 2020-10-10 ENCOUNTER — Other Ambulatory Visit: Payer: Self-pay

## 2020-10-10 VITALS — BP 120/60 | HR 74 | Resp 16 | Ht 71.0 in | Wt 283.4 lb

## 2020-10-10 DIAGNOSIS — Z23 Encounter for immunization: Secondary | ICD-10-CM

## 2020-10-10 DIAGNOSIS — N183 Chronic kidney disease, stage 3 unspecified: Secondary | ICD-10-CM | POA: Diagnosis not present

## 2020-10-10 DIAGNOSIS — N1832 Chronic kidney disease, stage 3b: Secondary | ICD-10-CM

## 2020-10-10 DIAGNOSIS — I25708 Atherosclerosis of coronary artery bypass graft(s), unspecified, with other forms of angina pectoris: Secondary | ICD-10-CM | POA: Diagnosis not present

## 2020-10-10 DIAGNOSIS — I119 Hypertensive heart disease without heart failure: Secondary | ICD-10-CM

## 2020-10-10 DIAGNOSIS — K5900 Constipation, unspecified: Secondary | ICD-10-CM

## 2020-10-10 DIAGNOSIS — Z794 Long term (current) use of insulin: Secondary | ICD-10-CM | POA: Diagnosis not present

## 2020-10-10 DIAGNOSIS — E1122 Type 2 diabetes mellitus with diabetic chronic kidney disease: Secondary | ICD-10-CM

## 2020-10-10 DIAGNOSIS — Z0000 Encounter for general adult medical examination without abnormal findings: Secondary | ICD-10-CM

## 2020-10-10 DIAGNOSIS — I25810 Atherosclerosis of coronary artery bypass graft(s) without angina pectoris: Secondary | ICD-10-CM | POA: Diagnosis not present

## 2020-10-10 DIAGNOSIS — H6121 Impacted cerumen, right ear: Secondary | ICD-10-CM

## 2020-10-10 NOTE — Progress Notes (Signed)
HPI: [name] is a pleasant 74 y.o. male, who is here today with his wife for AWV and chronic disease management.   He was last seen on 02/28/20. Last AWV on 08/31/19.  He lives with wife.. Independent for most ADL's and IADL's. He uses a cane for transfer.  Functional Status Survey: Is the patient deaf or have difficulty hearing?: Yes Does the patient have difficulty seeing, even when wearing glasses/contacts?: No Does the patient have difficulty concentrating, remembering, or making decisions?: No Does the patient have difficulty walking or climbing stairs?: Yes Does the patient have difficulty dressing or bathing?: No Does the patient have difficulty doing errands alone such as visiting a doctor's office or shopping?: No  Fall Risk  10/10/2020 08/31/2019  Falls in the past year? 0 1  Number falls in past yr: 0 0  Injury with Fall? 0 0  Risk for fall due to : Impaired balance/gait;Orthopedic patient Impaired balance/gait;Orthopedic patient;Impaired mobility  Follow up Education provided Education provided   Providers he sees regularly: Eye care provider: [place], [place]. Keratoconjunctivitis sicca and presbyopia. Dr Laban Emperor. Urologist: Dr Karsten Ro. Hx of prostate cancer, s/p radiotherapy treatment. Annual f/u. Cardiologist: Dr Peter [name].  Depression screen PHQ 2/9 10/10/2020  Decreased Interest 0  Down, Depressed, Hopeless 0  PHQ - 2 Score 0  Altered sleeping -  Tired, decreased energy -  Change in appetite -  Feeling bad or failure about yourself  -  Trouble concentrating -  Moving slowly or fidgety/restless -  Suicidal thoughts -  PHQ-9 Score -  Difficult doing work/chores -    Mini-Cog - 10/10/20 1837    Normal clock drawing test? yes    How many words correct? 3           Hearing Screening   '125Hz'  '250Hz'  '500Hz'  '1000Hz'  '2000Hz'  '3000Hz'  '4000Hz'  '6000Hz'  '8000Hz'   Right ear:           Left ear:             Visual Acuity Screening   Right  eye Left eye Both eyes  Without correction:     With correction: '20/30 20/30 20/30 '   He has not exercised since 07/2020. No major dietary changes. Lately he is snacking more. Cookies, crackers with peanut butter.  DM II: He is on Lantus 75 U daily. Dx'ed 2015.  He has not had hypoglycemic events. Lab Results  Component Value Date   HGBA1C 5.9 (A) 02/28/2020   BS's low 110's. Peripheral neuropathy: LE numbness and tingling.  HTN: Last week he felt dizzy, checked BP and it was 101/51. Negative for associated CP,palpitation,SOB,or diaphoresis He has had a few episodes of dizziness, has not always checked BP. He had some mild headache last week. He is on Metoprolol Tartrate 25 mg bid,Losartan 100 mg,HCTZ 25 mg daily,and Imdur 30 mg daily.  CKD III: He has not noted gross hematuria,foam in urine,or decreased urine output.  He had some constipation until last week and it lasted about 3 weeks.  Hard stool + straining. He has not noted blood in stool.  His wife has had a few appts and has not had time to cook. Usually when he eats food make at home he has no problems with bowel movements. He does not eat vegetables. He is not taking Benefiber.  HLD: He is on Atorvastatin 40 mg daily. Tolerating medication well.  CAD, next appt with his cardiologist in 12/2020.  He mentions that 2 weeks ago  he had an episode of chest discomfort, mid sternal pressure when resting. Tums did not help but Nitroglycerin SL help within 5 minutes. He has not had other episodes. No CP with exertion. No burping,heartburn,nausea,or vomiting.  Lab Results  Component Value Date   CHOL 101 12/16/2018   HDL 38 (L) 12/16/2018   LDLCALC 39 12/16/2018   TRIG 122 12/16/2018   CHOLHDL 2.7 12/16/2018   C/O worsening hearing loss,right. He thinks it is cerumen related. He started OTC ear drops and it has improved. He ahs c/o hearing loss in the past. According to pt, ENT said that hearing was not  bad.  Depression and insomnia: He is on Prozac 20 mg daily and Trazodone 100 mg at bedtime.  Review of Systems  Constitutional: Negative for activity change, appetite change, fatigue and fever.  HENT: Negative for nosebleeds and sore throat.   Eyes: Negative for redness and visual disturbance.  Respiratory: Negative for cough and wheezing.   Cardiovascular: Positive for leg swelling (No more than usual.).       Negative for orthopnea and PND.   Gastrointestinal: Negative for abdominal pain.  Genitourinary: Negative for decreased urine volume, dysuria and hematuria.  Musculoskeletal: Positive for arthralgias and gait problem.  Skin: Negative for pallor and rash.  Neurological: Negative for syncope, facial asymmetry and weakness.  Rest of ROS, see pertinent positives sand negatives in HPI  Current Outpatient Medications on File Prior to Visit  Medication Sig Dispense Refill  . aspirin 81 MG chewable tablet Chew by mouth daily.    Marland Kitchen atorvastatin (LIPITOR) 80 MG tablet Take 40 mg by mouth daily.    . Calcium Carbonate (CALCIUM 600 PO) Take by mouth daily.    . CORTISPORIN-TC 3.02-14-09-0.5 MG/ML OTIC suspension     . FLUoxetine (PROZAC) 20 MG capsule Take 1 capsule by mouth in the morning and at bedtime.    . hydrochlorothiazide (HYDRODIURIL) 25 MG tablet Take 25 mg by mouth daily.    . insulin glargine (LANTUS) 100 UNIT/ML injection Inject 0.75 mLs (75 Units total) into the skin daily. 10 mL 1  . isosorbide mononitrate (IMDUR) 30 MG 24 hr tablet Take 30 mg by mouth daily.    Marland Kitchen losartan (COZAAR) 100 MG tablet Take 100 mg by mouth daily.    . metoprolol tartrate (LOPRESSOR) 25 MG tablet Take 25 mg by mouth 2 (two) times daily.    . Multiple Vitamin (MULTI-VITAMINS) TABS Take by mouth.    . nitroGLYCERIN (NITROSTAT) 0.4 MG SL tablet Place 0.4 mg under the tongue every 5 (five) minutes as needed for chest pain.    . ranitidine (ZANTAC) 150 MG capsule Take 150 mg by mouth 2 (two) times daily.     . tamsulosin (FLOMAX) 0.4 MG CAPS capsule Take 0.4 mg by mouth.    . traZODone (DESYREL) 100 MG tablet Take 100 mg by mouth at bedtime.     No current facility-administered medications on file prior to visit.   Past Medical History:  Diagnosis Date  . Arthritis   . Cancer ([place])   . Chronic kidney disease   . Depression   . Diabetes mellitus without complication ([place])   . Frequent headaches   . History of chicken pox   . History of fainting spells of unknown cause   . Hyperlipidemia   . Hypertension   . PTSD (post-traumatic stress disorder)    No Known Allergies  Social History   Socioeconomic History  . Marital status: Married  Spouse name: Not on file  . Number of children: 2  . Years of education: Not on file  . Highest education level: Not on file  Occupational History  . Occupation: retired  Tobacco Use  . Smoking status: Former Smoker    Quit date: 12/14/1975    Years since quitting: 44.8  . Smokeless tobacco: Never Used  Vaping Use  . Vaping Use: Never assessed  Substance and Sexual Activity  . Alcohol use: Not Currently  . Drug use: Never  . Sexual activity: Not Currently  Other Topics Concern  . Not on file  Social History Narrative  . Not on file   Social Determinants of Health   Financial Resource Strain:   . Difficulty of Paying Living Expenses: Not on file  Food Insecurity:   . Worried About Charity fundraiser in the Last Year: Not on file  . Ran Out of Food in the Last Year: Not on file  Transportation Needs:   . Lack of Transportation (Medical): Not on file  . Lack of Transportation (Non-Medical): Not on file  Physical Activity:   . Days of Exercise per Week: Not on file  . Minutes of Exercise per Session: Not on file  Stress:   . Feeling of Stress : Not on file  Social Connections:   . Frequency of Communication with Friends and Family: Not on file  . Frequency of Social Gatherings with Friends and Family: Not on file  . Attends  Religious Services: Not on file  . Active Member of Clubs or Organizations: Not on file  . Attends Archivist Meetings: Not on file  . Marital Status: Not on file   Vitals:   10/10/20 0817  BP: 120/60  Pulse: 74  Resp: 16  SpO2: 96%   Body mass index is 39.52 kg/m.  Physical Exam Vitals and nursing note reviewed.  Constitutional:      General: He is not in acute distress.    Appearance: He is well-developed.  HENT:     Head: Normocephalic and atraumatic.     Ears:     Comments: Left cerumen excess , TM seen partially.    Mouth/Throat:     Mouth: Mucous membranes are moist.     Pharynx: Oropharynx is clear.  Eyes:     Conjunctiva/sclera: Conjunctivae normal.     Pupils: Pupils are equal, round, and reactive to light.  Cardiovascular:     Rate and Rhythm: Normal rate and regular rhythm.     Pulses:          Dorsalis pedis pulses are 2+ on the right side and 2+ on the left side.     Heart sounds: No murmur heard.      Comments: 1+ pitting RLE edema. Pulmonary:     Effort: Pulmonary effort is normal. No respiratory distress.     Breath sounds: Normal breath sounds.  Abdominal:     Palpations: Abdomen is soft. There is no hepatomegaly or mass.     Tenderness: There is no abdominal tenderness.  Lymphadenopathy:     Cervical: No cervical adenopathy.  Skin:    General: Skin is warm.     Findings: No erythema or rash.  Neurological:     Mental Status: He is alert and oriented to person, place, and time.     Cranial Nerves: No cranial nerve deficit.     Comments: Gait assisted by a cane.  Psychiatric:     Comments: Well groomed,  good eye contact.   ASSESSMENT AND PLAN:  [name] was seen today for AWV and chronic disease management.  Orders Placed This Encounter  Procedures  . Flu Vaccine QUAD High Dose(Fluad)  . COMPLETE METABOLIC PANEL WITH GFR  . Lipid panel  . Hemoglobin A1c  . Microalbumin / creatinine urine ratio   Lab Results    Component Value Date   HGBA1C 6.3 (H) 10/10/2020   Lab Results  Component Value Date   CHOL 113 10/10/2020   HDL 34 (L) 10/10/2020   LDLCALC 55 10/10/2020   TRIG 166 (H) 10/10/2020   CHOLHDL 3.3 10/10/2020    Lab Results  Component Value Date   CREATININE 1.58 (H) 10/10/2020   BUN 20 10/10/2020   NA 141 10/10/2020   K 3.7 10/10/2020   CL 104 10/10/2020   CO2 29 10/10/2020   Lab Results  Component Value Date   ALT 16 10/10/2020   AST 22 10/10/2020   BILITOT 0.8 10/10/2020   Lab Results  Component Value Date   MICROALBUR 7.8 10/10/2020   MICROALBUR 2.3 (H) 08/31/2019    Medicare annual wellness visit, subsequent We discussed the importance of staying active, physically and mentally, as well as the benefits of a healthy/balnace diet. Low impact exercise that involve stretching and strengthing are ideal. Vaccines up to date. We discussed preventive screening for the next 5-10 years, summery of recommendations given in AVS. Fall prevention. Advance directives and end of life discussed, he has living will and POA.   Constipation, unspecified constipation type Improved. Recommend adequate fiber and fluid intake. Benefiber 1 tsp bid may help. OTC Miralax at bedtime daily as needed.  Type 2 diabetes mellitus with stage 3 chronic kidney disease, with long-term current use of insulin, unspecified whether stage 3a or 3b CKD ([place]) HgA1C has been at goal. Continue Lantus 75 U daily, will adjust dose according to HgA1C. Regular exercise (as tolerated) and healthy diet with avoidance of added sugar food intake is an important part of treatment and recommended. Annual eye exam, periodic dental and foot care recommended. F/U in 5-6 months.  Need for influenza vaccination -     Flu Vaccine QUAD High Dose(Fluad)  Coronary artery disease of bypass graft of native heart with stable angina pectoris ([place]) Chest discomfort he had a couple weeks ago could have been angina. He has  no CP with exertion. Clearly instructed about warning signs. Continue Metoprolol,Imdur,aspirin,and Atorvastatin.  Hypertension with heart disease BP today adequately controlled. Having hypotension episodes. Recommend stopping HCTZ. Continue Metoprolol 25 mg bid,Imdur 30 mg,and Losartan 100 mg daily. Monitor BP daily and let me know about readings in a few weeks.  Stage 3b chronic kidney disease (HCC) Continue Losartan 100 mg daily and NSAID's avoidance. Low salt diet recommended and adequate hydration.  Excessive cerumen in ear canal, right Hearing improved. Not impacted cerumen. We discussed risk of ear lavage, which I do not think is indicated today.  Return in about 6 months (around 04/10/2021) for DM II,HTN.   Merida Alcantar G. [name], MD  [facility]. [place] office.  [name] , Thank you for taking time to come for your Medicare Wellness Visit. I appreciate your ongoing commitment to your health goals. Please review the following plan we discussed and let me know if I can assist you in the future.   These are the goals we discussed: Goals   None     This is a list of the screening recommended for  you and due dates:  Health Maintenance  Topic Date Due  . Eye exam for diabetics  06/12/2020  . Hemoglobin A1C  08/30/2020  . Complete foot exam   02/27/2021  . Colon Cancer Screening    . Flu Shot  Completed  . COVID-19 Vaccine  Completed  . Tetanus Vaccine  Discontinued  .  Hepatitis C: One time screening is recommended by Center for Disease Control  (CDC) for  adults born from [birth year] through 1967.   Discontinued  . Pneumonia vaccines  Discontinued    For constipation: benefiber 1 tsp 2 times per day and adequate hydration. Low impact exercise. If you have chest discomfort again ,please call your cardiologist.   A few things to remember from today's visit:   Need for influenza vaccination - Plan: Flu Vaccine QUAD High Dose(Fluad)  Hypertension  with heart disease  Type 2 diabetes mellitus with stage 3 chronic kidney disease, with long-term current use of insulin, unspecified whether stage 3a or 3b CKD ([place]) - Plan: COMPLETE METABOLIC PANEL WITH GFR, Hemoglobin A1c, Microalbumin / creatinine urine ratio  Coronary artery disease of bypass graft of native heart with stable angina pectoris ([place]) - Plan: Lipid panel  Stage 3b chronic kidney disease ([place]) - Plan: Microalbumin / creatinine urine ratio  Medicare annual wellness visit, subsequent  If you need refills please call your pharmacy. Do not use My Chart to request refills or for acute issues that need immediate attention.    Please be sure medication list is accurate. If a new problem present, please set up appointment sooner than planned today.  A few tips:  -As we age balance is not as good as it was, so there is a higher risks for falls. Please remove small rugs and furniture that is "in your way" and could increase the risk of falls. Stretching exercises may help with fall prevention: Yoga and Tai Chi are some examples. Low impact exercise is better, so you are not very achy the next day.  -Sun screen and avoidance of direct sun light recommended. Caution with dehydration, if working outdoors be sure to drink enough fluids.  - Some medications are not safe as we age, increases the risk of side effects and can potentially interact with other medication you are also taken;  including some of over the counter medications. Be sure to let me know when you start a new medication even if it is a dietary/vitamin supplement.   -Healthy diet low in red meet/animal fat and sugar + regular physical activity is recommended.

## 2020-10-10 NOTE — Patient Instructions (Addendum)
  Mr. Cayer , Thank you for taking time to come for your Medicare Wellness Visit. I appreciate your ongoing commitment to your health goals. Please review the following plan we discussed and let me know if I can assist you in the future.   These are the goals we discussed: Goals   None     This is a list of the screening recommended for you and due dates:  Health Maintenance  Topic Date Due  . Eye exam for diabetics  06/12/2020  . Hemoglobin A1C  08/30/2020  . Complete foot exam   02/27/2021  . Colon Cancer Screening    . Flu Shot  Completed  . COVID-19 Vaccine  Completed  . Tetanus Vaccine  Discontinued  .  Hepatitis C: One time screening is recommended by Center for Disease Control  (CDC) for  adults born from [birth year] through 1967.   Discontinued  . Pneumonia vaccines  Discontinued    For constipation: benefiber 1 tsp 2 times per day and adequate hydration. Low impact exercise. If you have chest discomfort again ,please call your cardiologist.   A few things to remember from today's visit:   Need for influenza vaccination - Plan: Flu Vaccine QUAD High Dose(Fluad)  Hypertension with heart disease  Type 2 diabetes mellitus with stage 3 chronic kidney disease, with long-term current use of insulin, unspecified whether stage 3a or 3b CKD ([place]) - Plan: COMPLETE METABOLIC PANEL WITH GFR, Hemoglobin A1c, Microalbumin / creatinine urine ratio  Coronary artery disease of bypass graft of native heart with stable angina pectoris ([place]) - Plan: Lipid panel  Stage 3b chronic kidney disease ([place]) - Plan: Microalbumin / creatinine urine ratio  Medicare annual wellness visit, subsequent  If you need refills please call your pharmacy. Do not use My Chart to request refills or for acute issues that need immediate attention.    Please be sure medication list is accurate. If a new problem present, please set up appointment sooner than planned today.  A few tips:  -As we age  balance is not as good as it was, so there is a higher risks for falls. Please remove small rugs and furniture that is "in your way" and could increase the risk of falls. Stretching exercises may help with fall prevention: Yoga and Tai Chi are some examples. Low impact exercise is better, so you are not very achy the next day.  -Sun screen and avoidance of direct sun light recommended. Caution with dehydration, if working outdoors be sure to drink enough fluids.  - Some medications are not safe as we age, increases the risk of side effects and can potentially interact with other medication you are also taken;  including some of over the counter medications. Be sure to let me know when you start a new medication even if it is a dietary/vitamin supplement.   -Healthy diet low in red meet/animal fat and sugar + regular physical activity is recommended.

## 2020-10-11 ENCOUNTER — Encounter: Payer: Self-pay | Admitting: Family Medicine

## 2020-10-11 LAB — HEMOGLOBIN A1C
Hgb A1c MFr Bld: 6.3 % of total Hgb — ABNORMAL HIGH (ref <5.7)
Mean Plasma Glucose: 134 (calc)
eAG (mmol/L): 7.4 (calc)

## 2020-10-11 LAB — COMPLETE METABOLIC PANEL WITH GFR
AG Ratio: 1.4 (calc) (ref 1.0–2.5)
ALT: 16 U/L (ref 9–46)
AST: 22 U/L (ref 10–35)
Albumin: 4.1 g/dL (ref 3.6–5.1)
Alkaline phosphatase (APISO): 74 U/L (ref 35–144)
BUN/Creatinine Ratio: 13 (calc) (ref 6–22)
BUN: 20 mg/dL (ref 7–25)
CO2: 29 mmol/L (ref 20–32)
Calcium: 9.3 mg/dL (ref 8.6–10.3)
Chloride: 104 mmol/L (ref 98–110)
Creat: 1.58 mg/dL — ABNORMAL HIGH (ref 0.70–1.18)
GFR, Est African American: 49 mL/min/{1.73_m2} — ABNORMAL LOW (ref >=60)
GFR, Est Non African American: 42 mL/min/{1.73_m2} — ABNORMAL LOW (ref >=60)
Globulin: 3 g/dL (calc) (ref 1.9–3.7)
Glucose, Bld: 118 mg/dL — ABNORMAL HIGH (ref 65–99)
Potassium: 3.7 mmol/L (ref 3.5–5.3)
Sodium: 141 mmol/L (ref 135–146)
Total Bilirubin: 0.8 mg/dL (ref 0.2–1.2)
Total Protein: 7.1 g/dL (ref 6.1–8.1)

## 2020-10-11 LAB — LIPID PANEL
Cholesterol: 113 mg/dL (ref <200)
HDL: 34 mg/dL — ABNORMAL LOW (ref >=40)
LDL Cholesterol (Calc): 55 mg/dL (calc)
Non-HDL Cholesterol (Calc): 79 mg/dL (calc) (ref <130)
Total CHOL/HDL Ratio: 3.3 (calc) (ref <5.0)
Triglycerides: 166 mg/dL — ABNORMAL HIGH (ref <150)

## 2020-10-11 LAB — MICROALBUMIN / CREATININE URINE RATIO
Creatinine, Urine: 199 mg/dL (ref 20–320)
Microalb Creat Ratio: 39 mcg/mg creat — ABNORMAL HIGH (ref <30)
Microalb, Ur: 7.8 mg/dL

## 2020-10-11 MED ORDER — INSULIN GLARGINE 100 UNIT/ML ~~LOC~~ SOLN: 75.0000 [IU] | Freq: Every day | SUBCUTANEOUS | 3 refills | Status: DC

## 2020-10-12 ENCOUNTER — Other Ambulatory Visit: Payer: Self-pay

## 2020-10-12 ENCOUNTER — Telehealth: Payer: Self-pay | Admitting: Cardiology

## 2020-10-12 ENCOUNTER — Telehealth: Payer: Self-pay

## 2020-10-12 ENCOUNTER — Emergency Department (HOSPITAL_COMMUNITY)
Admission: EM | Admit: 2020-10-12 | Discharge: 2020-10-12 | Disposition: A | Discharge status: Home / Self Care | Payer: Medicare Other | Attending: Emergency Medicine | Admitting: Emergency Medicine

## 2020-10-12 ENCOUNTER — Emergency Department (HOSPITAL_COMMUNITY): Payer: Medicare Other

## 2020-10-12 DIAGNOSIS — I129 Hypertensive chronic kidney disease with stage 1 through stage 4 chronic kidney disease, or unspecified chronic kidney disease: Secondary | ICD-10-CM | POA: Diagnosis not present

## 2020-10-12 DIAGNOSIS — Z79899 Other long term (current) drug therapy: Secondary | ICD-10-CM | POA: Diagnosis not present

## 2020-10-12 DIAGNOSIS — Z794 Long term (current) use of insulin: Secondary | ICD-10-CM | POA: Diagnosis not present

## 2020-10-12 DIAGNOSIS — E1122 Type 2 diabetes mellitus with diabetic chronic kidney disease: Secondary | ICD-10-CM | POA: Diagnosis not present

## 2020-10-12 DIAGNOSIS — R748 Abnormal levels of other serum enzymes: Secondary | ICD-10-CM | POA: Diagnosis not present

## 2020-10-12 DIAGNOSIS — N183 Chronic kidney disease, stage 3 unspecified: Secondary | ICD-10-CM | POA: Insufficient documentation

## 2020-10-12 DIAGNOSIS — R0789 Other chest pain: Secondary | ICD-10-CM | POA: Diagnosis not present

## 2020-10-12 DIAGNOSIS — Z87891 Personal history of nicotine dependence: Secondary | ICD-10-CM | POA: Diagnosis not present

## 2020-10-12 DIAGNOSIS — I2510 Atherosclerotic heart disease of native coronary artery without angina pectoris: Secondary | ICD-10-CM | POA: Diagnosis not present

## 2020-10-12 DIAGNOSIS — R079 Chest pain, unspecified: Secondary | ICD-10-CM | POA: Insufficient documentation

## 2020-10-12 DIAGNOSIS — Z951 Presence of aortocoronary bypass graft: Secondary | ICD-10-CM | POA: Insufficient documentation

## 2020-10-12 DIAGNOSIS — R1013 Epigastric pain: Secondary | ICD-10-CM | POA: Insufficient documentation

## 2020-10-12 DIAGNOSIS — Z7982 Long term (current) use of aspirin: Secondary | ICD-10-CM | POA: Diagnosis not present

## 2020-10-12 DIAGNOSIS — H9190 Unspecified hearing loss, unspecified ear: Secondary | ICD-10-CM

## 2020-10-12 DIAGNOSIS — Z859 Personal history of malignant neoplasm, unspecified: Secondary | ICD-10-CM | POA: Insufficient documentation

## 2020-10-12 LAB — BASIC METABOLIC PANEL
Anion gap: 12 (ref 5–15)
BUN: 16 mg/dL (ref 8–23)
CO2: 25 mmol/L (ref 22–32)
Calcium: 9.3 mg/dL (ref 8.9–10.3)
Chloride: 102 mmol/L (ref 98–111)
Creatinine, Ser: 1.45 mg/dL — ABNORMAL HIGH (ref 0.61–1.24)
GFR, Estimated: 50 mL/min — ABNORMAL LOW (ref >60)
Glucose, Bld: 101 mg/dL — ABNORMAL HIGH (ref 70–99)
Potassium: 3.4 mmol/L — ABNORMAL LOW (ref 3.5–5.1)
Sodium: 139 mmol/L (ref 135–145)

## 2020-10-12 LAB — TROPONIN I (HIGH SENSITIVITY)
Troponin I (High Sensitivity): 9 ng/L (ref <18)
Troponin I (High Sensitivity): 7 ng/L (ref <18)

## 2020-10-12 LAB — HEPATIC FUNCTION PANEL
ALT: 19 U/L (ref 0–44)
AST: 23 U/L (ref 15–41)
Albumin: 3.7 g/dL (ref 3.5–5.0)
Alkaline Phosphatase: 64 U/L (ref 38–126)
Bilirubin, Direct: 0.2 mg/dL (ref 0.0–0.2)
Indirect Bilirubin: 0.8 mg/dL (ref 0.3–0.9)
Total Bilirubin: 1 mg/dL (ref 0.3–1.2)
Total Protein: 6.9 g/dL (ref 6.5–8.1)

## 2020-10-12 LAB — LIPASE, BLOOD: Lipase: 270 U/L — ABNORMAL HIGH (ref 11–51)

## 2020-10-12 LAB — CBC
HCT: 34 % — ABNORMAL LOW (ref 39.0–52.0)
Hemoglobin: 11.6 g/dL — ABNORMAL LOW (ref 13.0–17.0)
MCH: 32 pg (ref 26.0–34.0)
MCHC: 34.1 g/dL (ref 30.0–36.0)
MCV: 93.9 fL (ref 80.0–100.0)
Platelets: 177 10*3/uL (ref 150–400)
RBC: 3.62 MIL/uL — ABNORMAL LOW (ref 4.22–5.81)
RDW: 14 % (ref 11.5–15.5)
WBC: 7.1 10*3/uL (ref 4.0–10.5)
nRBC: 0 % (ref 0.0–0.2)

## 2020-10-12 IMAGING — DX DG CHEST 2V
2 series · 2 of 2 positions shown · non-contrast
Comparison: None.

CLINICAL DATA: Chest pain

EXAM:
CHEST - 2 VIEW

[chest pa]
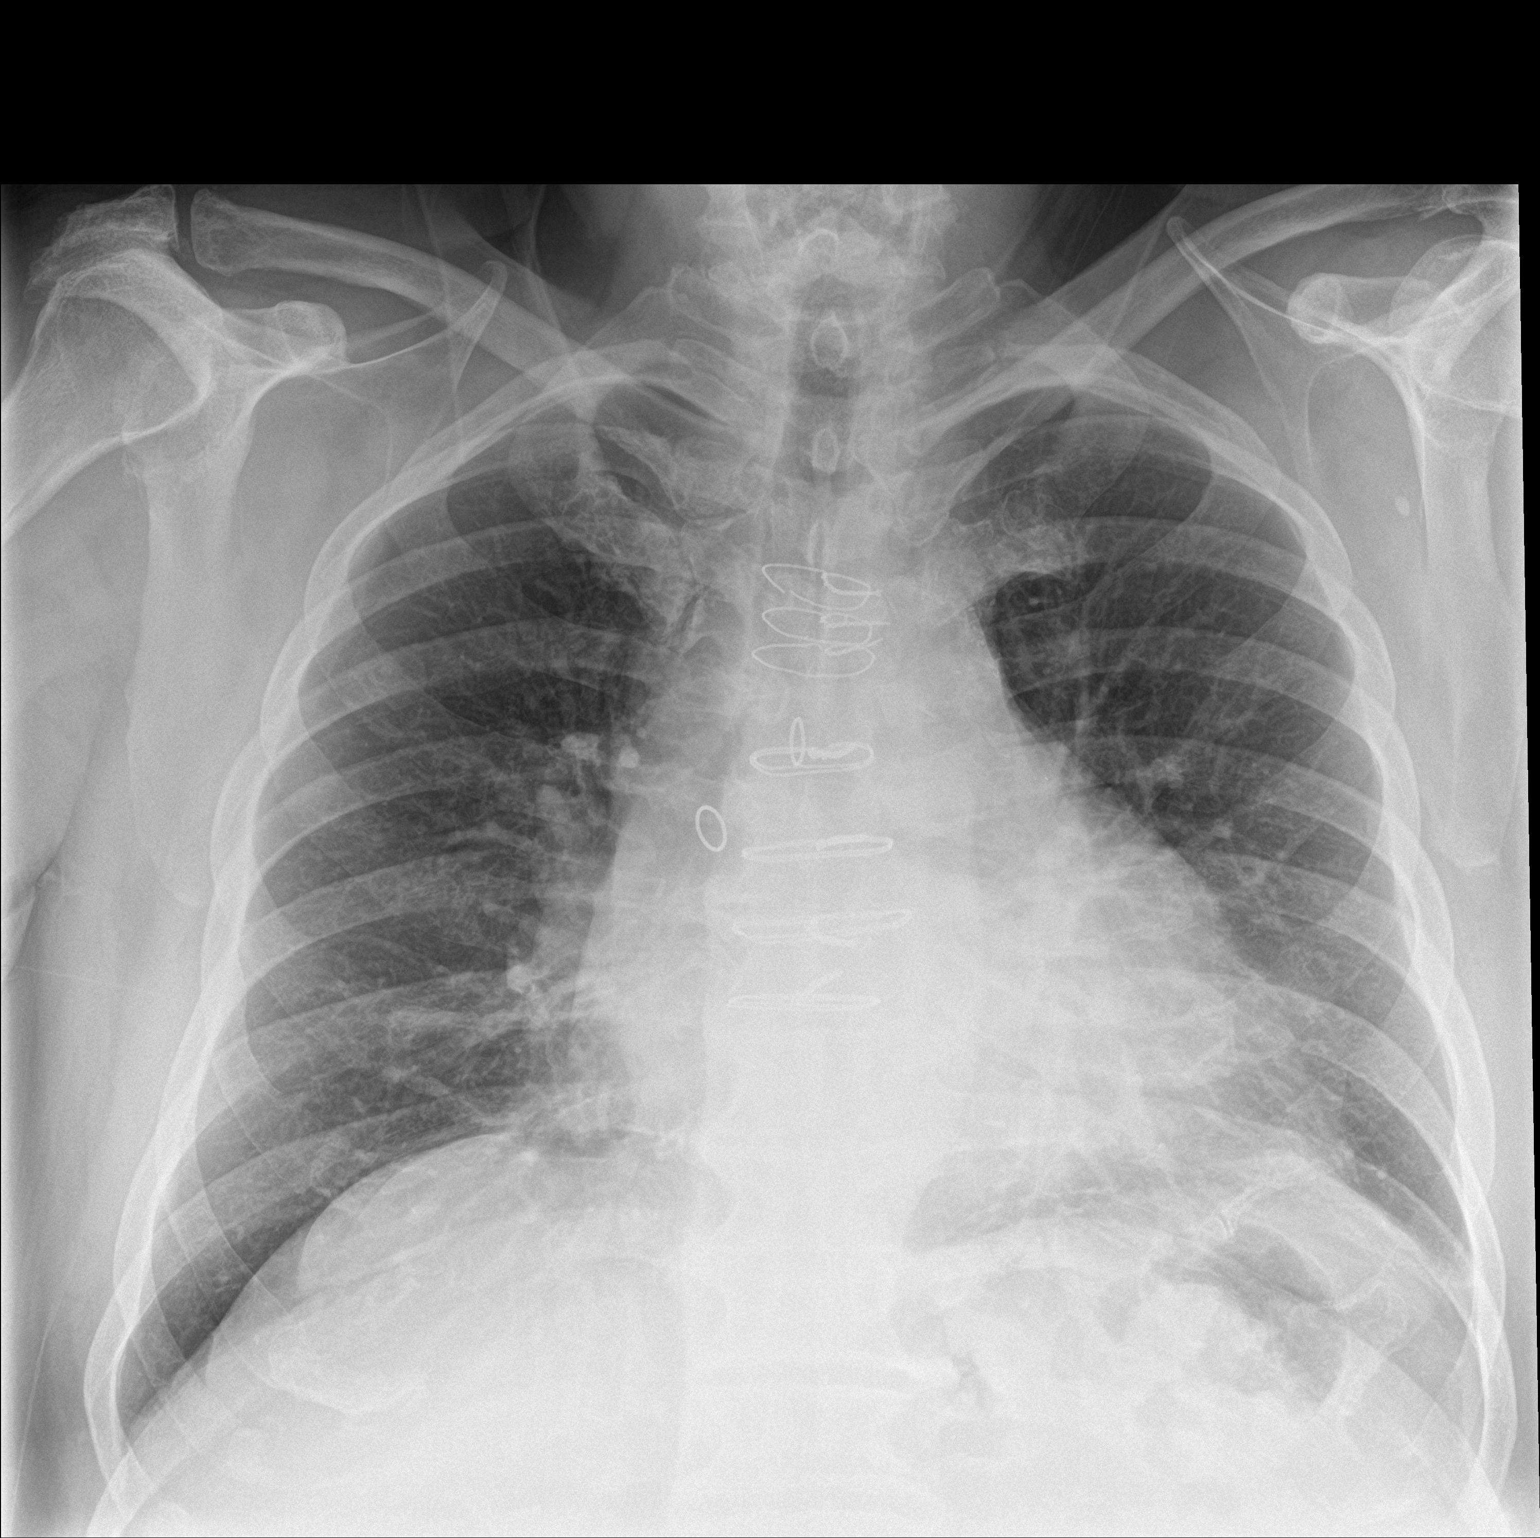

[chest lat]
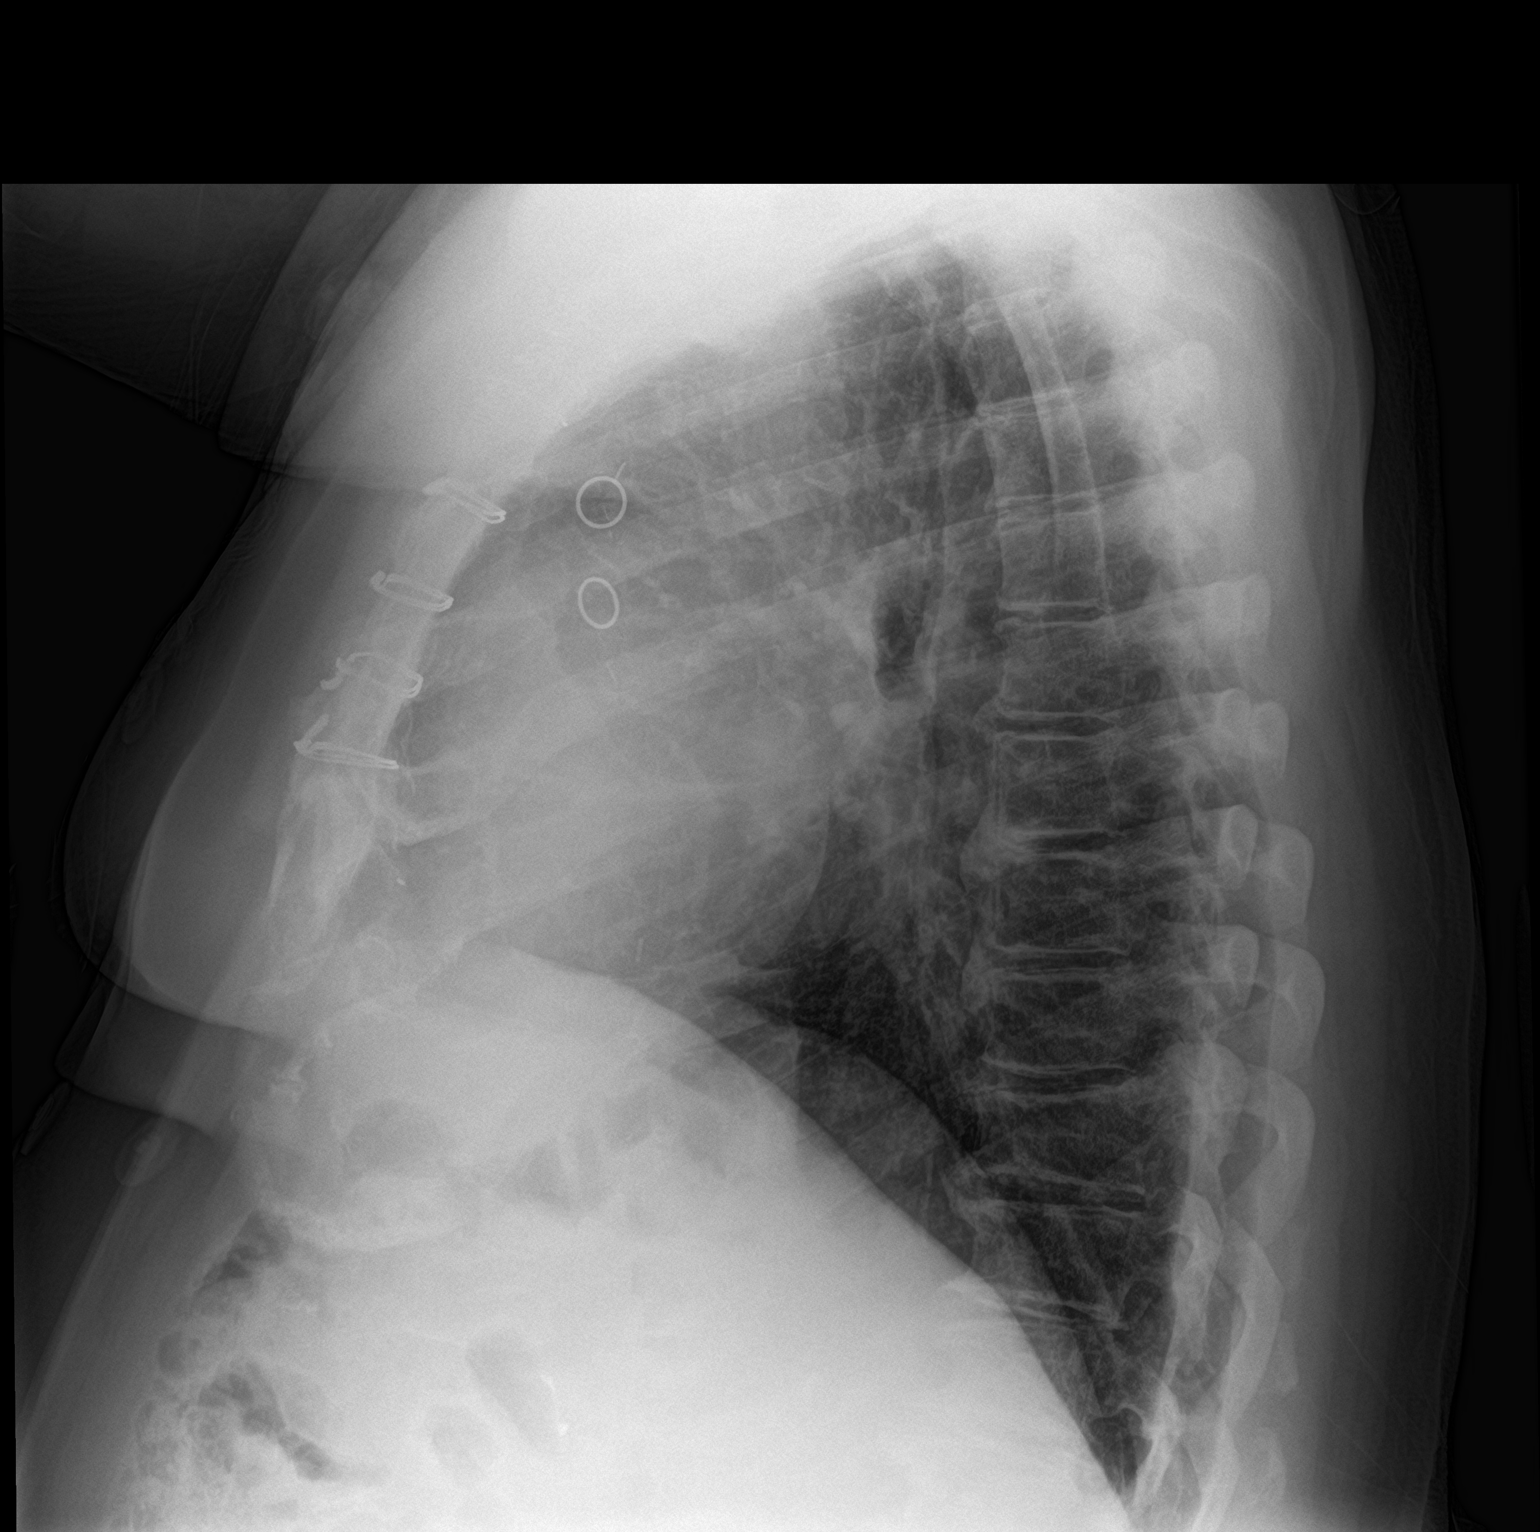

[2 of 2 positions shown; findings below may reference images not displayed]

FINDINGS: No consolidation or edema. No pleural effusion or pneumothorax.
Top-normal heart size. Evidence of prior CABG. No acute osseous
abnormality.
IMPRESSION: No acute process in the chest.

## 2020-10-12 MED ORDER — OMEPRAZOLE 20 MG PO CPDR: 20.0000 mg | DELAYED_RELEASE_CAPSULE | Freq: Every day | ORAL | 0 refills | Status: DC

## 2020-10-12 NOTE — ED Triage Notes (Signed)
Pt c/o cp for the past few days with SOB.

## 2020-10-12 NOTE — Telephone Encounter (Signed)
Returned call to patient of Dr. [name] w/CAD, h/o CABG He reports active chest pain now - center of chest, radiating to left & right He also had chest pain last week that eased up but returned He has SOB, but this is all the time He reports headache  He has used NTG last week and today but reports residual chest pain today after 9am NTG - tried Tums around 1pm with no relief Advised he go to [facility] ED for evaluation to which he agreed Notified ED nurse a hospital

## 2020-10-12 NOTE — Discharge Instructions (Signed)
1.  Your heart enzymes were normal today.  There is no evidence that you have had a heart attack within the past week.  It is very important you follow-up with your cardiologist as planned this week.  Return to the emergency department immediately if you have increasing or worsening chest pain, shortness of breath, lightheadedness or feeling in a pass out or other concerning symptoms. 2.  Reflux and gastritis can cause symptoms of lower chest and upper abdominal discomfort.  Add omeprazole to your medications.  This is to help with reflux symptoms.  Continue your ranitidine as prescribed. 3.  There is a mild elevation in a lab called the lipase.  This is associated with the pancreas.  You may have a early or mild pancreatitis.  The treatment for this is a very low-fat to no fat diet.  Eat only small amounts.  No alcohol.  You need a recheck of this lab value Monday or Tuesday.  Return to the emergency department immediately if your pain becomes constant, persistent, worsening, you develop vomiting or other concerning symptoms.

## 2020-10-12 NOTE — Telephone Encounter (Signed)
Pt c/o of Chest Pain: STAT if CP now or developed within 24 hours  1. Are you having CP right now? Yes   2. Are you experiencing any other symptoms (ex. SOB, nausea, vomiting, sweating)? Headache from back of neck to forehead & a little SOB   3. How long have you been experiencing CP? Started this morning   4. Is your CP continuous or coming and going? Continuous   5. Have you taken Nitroglycerin? Yes at 9 am, eased off a little but is still present    ?

## 2020-10-12 NOTE — Telephone Encounter (Signed)
New referral has been placed.  °

## 2020-10-12 NOTE — ED Provider Notes (Signed)
[facility] EMERGENCY DEPARTMENT Provider Note   CSN: [id number] Arrival date & time: 10/12/20  1447     History Chief Complaint  Patient presents with   Chest Pain    [name] is a 74 y.o. male.  HPI Patient has a history of DM on insulin, CKD, HTN and HLD. He has known CAD s/p CABG in 2004. Previously followed by Dr. Peterson Ao at [facility] in [place]. Last Cardiac cath in July 2019 for evaluation of chest pain showed 90% proximal LAD, occluded nondominant LCx and occluded RCA. The grafts were patent including LIMA to the LAD, SVG to RCA and SVG to ramus with 40-50% proximal stenosis. Normal EF and EDP. 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 Now patient sees Dr. Peter [name] [facility].  Patient reports for the past 3 weeks he has been experiencing chest pain.  He reports he will have certain days where he has pain most of the day.  It is a pressure and fullness sensation in his lower chest and epigastrium.  He reports it will migrate around and sometimes radiates out to both sides were slightly lower into the abdomen or higher into the chest.  At intervals he has tried nitroglycerin without relief.  He reports he consistently takes his reflux medication.   Patient reports that he is very sedentary.  He mostly walks a distance of 10 yards across his house.  He does not walk out to the mailbox or go shopping.  He does not endorse any significant difference in terms of chest pain or shortness of breath with the amount of exertion he does at baseline.  Patient is try to combination of antacids and nitroglycerin for discomfort does not notice any improvement when he takes either of these medications.    Past Medical History:  Diagnosis Date   Arthritis    Cancer ([place])    Chronic kidney disease    Depression    Diabetes mellitus without complication (HCC)    Frequent headaches     History of chicken pox    History of fainting spells of unknown cause    Hyperlipidemia    Hypertension    PTSD (post-traumatic stress disorder)     Patient Active Problem List   Diagnosis Date Noted   Coronary artery disease of bypass graft of native heart with stable angina pectoris ([place]) 02/28/2020   DM (diabetes mellitus), type 2 with renal complications ([place]) 40/98/1191   Hypertension with heart disease 04/01/2019   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   CKD (chronic kidney disease), stage III ([place]) 04/01/2019   Peripheral neuropathy 04/01/2019   Insomnia 11/30/2018    Past Surgical History:  Procedure Laterality Date   CARDIAC SURGERY     Triple Bypass   CHOLECYSTECTOMY  2010   CORONARY ARTERY BYPASS GRAFT  2004   TONSILLECTOMY  1958       Family History  Problem Relation Age of Onset   Arthritis Mother    Cancer Mother    Heart disease Mother    Hyperlipidemia Mother    Hypertension Mother    Heart attack Mother    Cancer Sister    Heart attack Brother    Heart disease Brother    Depression Daughter    Arthritis Sister    Cancer Brother    Learning disabilities Brother    Alcohol abuse Brother  COPD Brother    Heart attack Brother    Heart disease Brother     Social History   Tobacco Use   Smoking status: Former Smoker    Quit date: 12/14/1975    Years since quitting: 44.8   Smokeless tobacco: Never Used  Scientific laboratory technician Use: Never assessed  Substance Use Topics   Alcohol use: Not Currently   Drug use: Never    Home Medications Prior to Admission medications   Medication Sig Start Date End Date Taking? Authorizing Provider  aspirin 81 MG chewable tablet Chew by mouth daily.    [provider]  atorvastatin (LIPITOR) 80 MG tablet Take 40 mg by mouth daily. 09/22/19   [provider]  Calcium Carbonate (CALCIUM 600 PO) Take by mouth daily.    [provider]   CORTISPORIN-TC 3.02-14-09-0.5 MG/ML OTIC suspension  08/29/19   [provider]  FLUoxetine (PROZAC) 20 MG capsule Take 1 capsule by mouth in the morning and at bedtime. 09/22/19   [provider]  hydrochlorothiazide (HYDRODIURIL) 25 MG tablet Take 25 mg by mouth daily.    [provider]  insulin glargine (LANTUS) 100 UNIT/ML injection Inject 0.75 mLs (75 Units total) into the skin daily. 10/11/20   [name], [name] G, MD  isosorbide mononitrate (IMDUR) 30 MG 24 hr tablet Take 30 mg by mouth daily.    [provider]  losartan (COZAAR) 100 MG tablet Take 100 mg by mouth daily.    [provider]  metoprolol tartrate (LOPRESSOR) 25 MG tablet Take 25 mg by mouth 2 (two) times daily.    [provider]  Multiple Vitamin (MULTI-VITAMINS) TABS Take by mouth.    [provider]  nitroGLYCERIN (NITROSTAT) 0.4 MG SL tablet Place 0.4 mg under the tongue every 5 (five) minutes as needed for chest pain.    [provider]  omeprazole (PRILOSEC) 20 MG capsule Take 1 capsule (20 mg total) by mouth daily. 10/12/20   Charlesetta Shanks, MD  ranitidine (ZANTAC) 150 MG capsule Take 150 mg by mouth 2 (two) times daily.    [provider]  tamsulosin (FLOMAX) 0.4 MG CAPS capsule Take 0.4 mg by mouth.    [provider]  traZODone (DESYREL) 100 MG tablet Take 100 mg by mouth at bedtime.    [provider]    Allergies    Patient has no known allergies.  Review of Systems   Review of Systems 10 systems reviewed and negative except as per HPI Physical Exam Updated Vital Signs BP 134/71 (BP Location: Right Arm)    Pulse 65    Temp 98.2 F (36.8 C) (Oral)    Resp 18    Ht 5\' 11"  (1.803 m)    Wt 128.5 kg    SpO2 99%    BMI 39.51 kg/m   Physical Exam Constitutional:      Comments: Alert and nontoxic.  No respiratory distress at rest.  Deconditioned but well in appearance.  HENT:     Head: Normocephalic and atraumatic.       Mouth/Throat:     Pharynx: Oropharynx is clear.  Eyes:     Extraocular Movements: Extraocular movements intact.  Cardiovascular:     Rate and Rhythm: Normal rate and regular rhythm.  Pulmonary:     Effort: Pulmonary effort is normal.     Breath sounds: Normal breath sounds.  Abdominal:     General: There is no distension.  Palpations: Abdomen is soft.     Tenderness: There is no abdominal tenderness. There is no guarding.  Musculoskeletal:     Comments: 1-2+ pitting edema bilateral lower legs.  Calves nontender.  Skin:    General: Skin is warm and dry.  Neurological:     General: No focal deficit present.     Mental Status: He is oriented to person, place, and time.     Coordination: Coordination normal.  Psychiatric:        Mood and Affect: Mood normal.     ED Results / Procedures / Treatments   Labs (all labs ordered are listed, but only abnormal results are displayed) Labs Reviewed  BASIC METABOLIC PANEL - Abnormal; Notable for the following components:      Result Value   Potassium 3.4 (*)    Glucose, Bld 101 (*)    Creatinine, Ser 1.45 (*)    GFR, Estimated 50 (*)    All other components within normal limits  CBC - Abnormal; Notable for the following components:   RBC 3.62 (*)    Hemoglobin 11.6 (*)    HCT 34.0 (*)    All other components within normal limits  LIPASE, BLOOD - Abnormal; Notable for the following components:   Lipase 270 (*)    All other components within normal limits  HEPATIC FUNCTION PANEL  TROPONIN I (HIGH SENSITIVITY)  TROPONIN I (HIGH SENSITIVITY)    EKG EKG Interpretation  Date/Time:  Friday October 12 2020 14:50:48 EDT Ventricular Rate:  67 PR Interval:  166 QRS Duration: 88 QT Interval:  402 QTC Calculation: 424 R Axis:   29 Text Interpretation: Normal sinus rhythm poor R wave progression. no STEMI. no old comparison Confirmed by Charlesetta Shanks [phone] on 10/12/2020 6:14:17 PM   Radiology DG Chest 2 View  Result  Date: 10/12/2020 CLINICAL DATA:  Chest pain EXAM: CHEST - 2 VIEW COMPARISON:  None. FINDINGS: No consolidation or edema. No pleural effusion or pneumothorax. Top-normal heart size. Evidence of prior CABG. No acute osseous abnormality. IMPRESSION: No acute process in the chest. Electronically Signed   By: Macy Mis M.D.   On: 10/12/2020 15:24    Procedures Procedures (including critical care time)  Medications Ordered in ED Medications - No data to display  ED Course  I have reviewed the triage vital signs and the nursing notes.  Pertinent labs & imaging results that were available during my care of the patient were reviewed by me and considered in my medical decision making (see chart for details).    MDM Rules/Calculators/A&P                          Consult: Reviewed with cardiology Dr. Charmaine Downs.  At this time with the troponins negative and 3 weeks of pain, patient appropriate for close outpatient follow-up for further cardiac monitoring and diagnostic evaluation.  Patient has had symptoms on and off for 3 weeks of chest discomfort.  This is in the lower chest and upper abdomen.  He describes symptoms are somewhat migratory and not improving with nitroglycerin.  I have lower suspicion for cardiac ischemic symptoms.  Patient however does have significant risk factors.  His wife has already made follow-up appointment at the beginning of the week with cardiology group.  We reviewed return precautions.  Patient has mild elevation of lipase.  Pain has been waxing and waning.  At this time no sign of severe pancreatitis.  Patient does  not drink alcohol.  No elevation in LFTs or white count.  Recommendations are for diet for pancreatitis and careful return precautions reviewed with plan for recheck of lipase early in the week. Final Clinical Impression(s) / ED Diagnoses Final diagnoses:  Nonspecific chest pain  Abnormal serum level of lipase    Rx / DC Orders ED Discharge Orders          Ordered    omeprazole (PRILOSEC) 20 MG capsule  Daily        10/12/20 2100           Charlesetta Shanks, MD 10/12/20 2107

## 2020-10-12 NOTE — Telephone Encounter (Signed)
Patient wife called in stating that patient would like a referral sent to another ENT.     Please advise if this can be done   Already has office in mind  Dr. Hassell Done  [phone] [place]  [facility]

## 2020-10-12 NOTE — ED Notes (Signed)
Patient verbalizes understanding of discharge instructions. Opportunity for questioning and answers were provided. Armband removed by staff, pt discharged from ED stable & ambulatory  

## 2020-10-17 ENCOUNTER — Ambulatory Visit (INDEPENDENT_AMBULATORY_CARE_PROVIDER_SITE_OTHER): Payer: Medicare Other | Admitting: Cardiology

## 2020-10-17 ENCOUNTER — Encounter: Payer: Self-pay | Admitting: Cardiology

## 2020-10-17 ENCOUNTER — Other Ambulatory Visit: Payer: Self-pay

## 2020-10-17 VITALS — BP 108/69 | HR 72 | Ht 71.0 in | Wt 283.0 lb

## 2020-10-17 DIAGNOSIS — E1122 Type 2 diabetes mellitus with diabetic chronic kidney disease: Secondary | ICD-10-CM | POA: Diagnosis not present

## 2020-10-17 DIAGNOSIS — I25810 Atherosclerosis of coronary artery bypass graft(s) without angina pectoris: Secondary | ICD-10-CM | POA: Diagnosis not present

## 2020-10-17 DIAGNOSIS — Z794 Long term (current) use of insulin: Secondary | ICD-10-CM

## 2020-10-17 DIAGNOSIS — Z77098 Contact with and (suspected) exposure to other hazardous, chiefly nonmedicinal, chemicals: Secondary | ICD-10-CM | POA: Insufficient documentation

## 2020-10-17 DIAGNOSIS — E785 Hyperlipidemia, unspecified: Secondary | ICD-10-CM | POA: Diagnosis not present

## 2020-10-17 DIAGNOSIS — R079 Chest pain, unspecified: Secondary | ICD-10-CM | POA: Diagnosis not present

## 2020-10-17 DIAGNOSIS — N183 Chronic kidney disease, stage 3 unspecified: Secondary | ICD-10-CM

## 2020-10-17 DIAGNOSIS — Z951 Presence of aortocoronary bypass graft: Secondary | ICD-10-CM | POA: Diagnosis not present

## 2020-10-17 DIAGNOSIS — R748 Abnormal levels of other serum enzymes: Secondary | ICD-10-CM | POA: Diagnosis not present

## 2020-10-17 DIAGNOSIS — N1831 Chronic kidney disease, stage 3a: Secondary | ICD-10-CM

## 2020-10-17 LAB — LIPASE: Lipase: 153 U/L — ABNORMAL HIGH (ref 13–78)

## 2020-10-17 NOTE — Patient Instructions (Addendum)
Medication Instructions:  Continue current medications  *If you need a refill on your cardiac medications before your next appointment, please call your pharmacy*   Lab Work: Lipase Today   Testing/Procedures: None Ordered   Follow-Up: At [facility], you and your health needs are our priority.  As part of our continuing mission to provide you with exceptional heart care, we have created designated Provider Care Teams.  These Care Teams include your primary Cardiologist (physician) and Advanced Practice Providers (APPs -  Physician Assistants and Nurse Practitioners) who all work together to provide you with the care you need, when you need it.  We recommend signing up for the patient portal called "MyChart".  Sign up information is provided on this After Visit Summary.  MyChart is used to connect with patients for Virtual Visits (Telemedicine).  Patients are able to view lab/test results, encounter notes, upcoming appointments, etc.  Non-urgent messages can be sent to your provider as well.   To learn more about what you can do with MyChart, go to [email or web address].    Your next appointment:   Keep appointment with Dr [name] in January  The format for your next appointment:   In Person  Provider:   Peter [name], MD

## 2020-10-17 NOTE — Assessment & Plan Note (Signed)
271- Re check today

## 2020-10-17 NOTE — Progress Notes (Addendum)
Cardiology Office Note:    Date:  10/17/2020   ID:  [name], [name] 20-Mar-1946, MRN [id number]  PCP:  [name], [name] G, MD  Cardiologist:  Dr [name] Electrophysiologist:  None   Referring MD: [name], [name] G, MD   CC:  F/U from ED visit 10/12/2020  History of Present Illness:    [name] is a 74 y.o. male, [place] with a hx of CAD, IDDM, CRI3, HLD, Agent Orange exposure, and CAD.  In 2004 he had CABG x3 at the [facility].  He was then followed by Dr. Lucia [name] in [place].  Catheterization was done in 2016 and again in July 2018 and showed patent grafts.  He is seen in the office today after an emergency room visit 10/12/2020 for chest pain.  The patient says 3 weeks ago he had an episode at rest of midsternal chest pain that radiated out to both sides of his chest.  He took an ant-acid and a SL nitroglycerin with no relief.  He denies any associated nausea vomiting or diaphoresis but now admits he was mildly short of breath.  He says his symptoms lasted for hours and were still present when he went to bed and present when he woke up the next morning.  After that episode his symptoms abated.  He had another episode a week later which was similar, and after the third episode the following week he presented to the emergency room for further evaluation.  In the emergency room his EKG showed no acute changes and his troponins were negative.  Based on his history, lab results, and EKG results it was felt that his chest pain was most likely noncardiac.  He was placed on a PPI.  He is seen in the office today for follow-up.  He presents to the office today accompanied by his wife.  He has not noted any exertional chest discomfort.  The patient does say that he is pretty inactive at home. His wife indicates that they have changed his diet. In the ED he did have an elevated lipase but no other signs or symptoms of pancreatitis.  It was recommended that he have a f/u Lipase at this  visit. EKG in the office today shows no acute changes.  Past Medical History:  Diagnosis Date  . Arthritis   . Cancer ([place])   . Chronic kidney disease   . Depression   . Diabetes mellitus without complication ([place])   . Frequent headaches   . History of chicken pox   . History of fainting spells of unknown cause   . Hyperlipidemia   . Hypertension   . PTSD (post-traumatic stress disorder)     Past Surgical History:  Procedure Laterality Date  . CARDIAC SURGERY     Triple Bypass  . CHOLECYSTECTOMY  2010  . CORONARY ARTERY BYPASS GRAFT  2004  . TONSILLECTOMY  1958    Current Medications: Current Meds  Medication Sig  . aspirin 81 MG chewable tablet Chew by mouth daily.  Marland Kitchen atorvastatin (LIPITOR) 80 MG tablet Take 40 mg by mouth daily.  . Calcium Carbonate (CALCIUM 600 PO) Take by mouth daily.  . CORTISPORIN-TC 3.02-14-09-0.5 MG/ML OTIC suspension   . FLUoxetine (PROZAC) 20 MG capsule Take 1 capsule by mouth in the morning and at bedtime.  . hydrochlorothiazide (HYDRODIURIL) 25 MG tablet Take 25 mg by mouth daily.  . insulin glargine (LANTUS) 100 UNIT/ML injection Inject 0.75 mLs (75 Units total) into the skin daily.  Marland Kitchen  isosorbide mononitrate (IMDUR) 30 MG 24 hr tablet Take 30 mg by mouth daily.  Marland Kitchen losartan (COZAAR) 100 MG tablet Take 100 mg by mouth daily.  . metoprolol tartrate (LOPRESSOR) 25 MG tablet Take 25 mg by mouth 2 (two) times daily.  . Multiple Vitamin (MULTI-VITAMINS) TABS Take by mouth.  . nitroGLYCERIN (NITROSTAT) 0.4 MG SL tablet Place 0.4 mg under the tongue every 5 (five) minutes as needed for chest pain.  Marland Kitchen omeprazole (PRILOSEC) 20 MG capsule Take 1 capsule (20 mg total) by mouth daily.  . ranitidine (ZANTAC) 150 MG capsule Take 150 mg by mouth 2 (two) times daily.  . tamsulosin (FLOMAX) 0.4 MG CAPS capsule Take 0.4 mg by mouth.  . traZODone (DESYREL) 100 MG tablet Take 100 mg by mouth at bedtime.     Allergies:   Patient has no known allergies.   Social  History   Socioeconomic History  . Marital status: Married    Spouse name: Not on file  . Number of children: 2  . Years of education: Not on file  . Highest education level: Not on file  Occupational History  . Occupation: retired  Tobacco Use  . Smoking status: Former Smoker    Quit date: 12/14/1975    Years since quitting: 44.8  . Smokeless tobacco: Never Used  Vaping Use  . Vaping Use: Never assessed  Substance and Sexual Activity  . Alcohol use: Not Currently  . Drug use: Never  . Sexual activity: Not Currently  Other Topics Concern  . Not on file  Social History Narrative  . Not on file   Social Determinants of Health   Financial Resource Strain:   . Difficulty of Paying Living Expenses: Not on file  Food Insecurity:   . Worried About Charity fundraiser in the Last Year: Not on file  . Ran Out of Food in the Last Year: Not on file  Transportation Needs:   . Lack of Transportation (Medical): Not on file  . Lack of Transportation (Non-Medical): Not on file  Physical Activity:   . Days of Exercise per Week: Not on file  . Minutes of Exercise per Session: Not on file  Stress:   . Feeling of Stress : Not on file  Social Connections:   . Frequency of Communication with Friends and Family: Not on file  . Frequency of Social Gatherings with Friends and Family: Not on file  . Attends Religious Services: Not on file  . Active Member of Clubs or Organizations: Not on file  . Attends Archivist Meetings: Not on file  . Marital Status: Not on file     Family History: The patient's family history includes Alcohol abuse in his brother; Arthritis in his mother and sister; COPD in his brother; Cancer in his brother, mother, and sister; Depression in his daughter; Heart attack in his brother, brother, and mother; Heart disease in his brother, brother, and mother; Hyperlipidemia in his mother; Hypertension in his mother; Learning disabilities in his brother.  ROS:    Please see the history of present illness.     All other systems reviewed and are negative.  EKGs/Labs/Other Studies Reviewed:    The following [facility] were reviewed today[facility]  EKG:  EKG is ordered today.  The ekg ordered today demonstrates NSR, poor anterior RW, HR 72  Recent Labs: 10/12/2020: ALT 19; BUN 16; Creatinine, Ser 1.45; Hemoglobin 11.6; Platelets 177; Potassium 3.4; Sodium 139  Recent Lipid Panel  Component Value Date/Time   CHOL 113 10/10/2020 0940   CHOL 101 12/16/2018 1003   TRIG 166 (H) 10/10/2020 0940   HDL 34 (L) 10/10/2020 0940   HDL 38 (L) 12/16/2018 1003   CHOLHDL 3.3 10/10/2020 0940   LDLCALC 55 10/10/2020 0940    Physical Exam:    VS:  BP 108/69   Pulse 72   Ht 5\' 11"  (1.803 m)   Wt 283 lb (128.4 kg)   SpO2 97%   BMI 39.47 kg/m     Wt Readings from Last 3 Encounters:  10/17/20 283 lb (128.4 kg)  10/12/20 283 lb 4.7 oz (128.5 kg)  10/10/20 283 lb 6 oz (128.5 kg)     WCH:ENIDPOEUMP (BMI 39) Caucasian male, in no acute distress HEENT: Normal NECK: No JVD; No carotid bruits CARDIAC:RRR, no murmurs, rubs, gallops RESPIRATORY:  Clear to auscultation without rales, wheezing or rhonchi  ABDOMEN: Soft, non-tender, non-distended MUSCULOSKELETAL:  No edema; No deformity  SKIN: Warm and dry NEUROLOGIC:  Alert and oriented x 3 PSYCHIATRIC:  Normal affect   ASSESSMENT:    Hx of CABG CABG x 4 at [facility] 2016 and 2018 ([facility]Dr [name])- patent grafts- L-LAD, S-RCA, S-RI  Chest pain of uncertain etiology Seen today after ED visit 10/12/2020  Elevated lipase 271- Re check today  DM (diabetes mellitus), type 2 with renal complications (HCC) Hgb N3I- 6.3  CKD (chronic kidney disease), stage III (HCC) SCr 1.5, GFR 40-50  H/O agent Orange exposure 30 yr [place] vet  Dyslipidemia, goal LDL below 70 LDL 55 on statin rx 10/10/2020  PLAN:    Check f/u Lipase.  It's not clear that his symptoms were secondary to  angina.  He is better after diet changes and addition of PPI.  I expect medical Rx will be recommended if he has recurrent symptoms,  He would be at increased risk for AKI with cath.  Keep f/u with Dr [name] as scheduled.  If he has recurrent chest pain increase imdur to 60 mg and call for f/u.   Medication Adjustments/Labs and Tests Ordered: Current medicines are reviewed at length with the patient today.  Concerns regarding medicines are outlined above.  Orders Placed This Encounter  Procedures  . Lipase   No orders of the defined types were placed in this encounter.   Patient Instructions  Medication Instructions:  Continue current medications  *If you need a refill on your cardiac medications before your next appointment, please call your pharmacy*   Lab Work: Lipase Today   Testing/Procedures: None Ordered   Follow-Up: At [facility], you and your health needs are our priority.  As part of our continuing mission to provide you with exceptional heart care, we have created designated Provider Care Teams.  These Care Teams include your primary Cardiologist (physician) and Advanced Practice Providers (APPs -  Physician Assistants and Nurse Practitioners) who all work together to provide you with the care you need, when you need it.  We recommend signing up for the patient portal called "MyChart".  Sign up information is provided on this After Visit Summary.  MyChart is used to connect with patients for Virtual Visits (Telemedicine).  Patients are able to view lab/test results, encounter notes, upcoming appointments, etc.  Non-urgent messages can be sent to your provider as well.   To learn more about what you can do with MyChart, go to [email or web address].    Your next appointment:   Keep appointment with Dr [name] in January  The format for your next appointment:   In Person  Provider:   Peter [name], MD        Signed, Kerin Ransom, PA-C  10/17/2020 10:16 AM     [place]

## 2020-10-17 NOTE — Assessment & Plan Note (Signed)
Hgb A1c- 6.3

## 2020-10-17 NOTE — Assessment & Plan Note (Signed)
LDL 55 on statin rx 10/10/2020

## 2020-10-17 NOTE — Assessment & Plan Note (Signed)
Seen today after ED visit 10/12/2020

## 2020-10-17 NOTE — Assessment & Plan Note (Signed)
64 yr [place]

## 2020-10-17 NOTE — Assessment & Plan Note (Signed)
SCr 1.5, GFR 40-50

## 2020-10-17 NOTE — Assessment & Plan Note (Signed)
CABG x 4 at [facility] 2016 and 2018 ([facility] Lucia Gask[facility])- patent grafts- L-LAD, S-RCA, S-RI

## 2020-10-25 DIAGNOSIS — R42 Dizziness and giddiness: Secondary | ICD-10-CM | POA: Diagnosis not present

## 2020-10-25 DIAGNOSIS — H6122 Impacted cerumen, left ear: Secondary | ICD-10-CM | POA: Diagnosis not present

## 2020-10-25 DIAGNOSIS — H9312 Tinnitus, left ear: Secondary | ICD-10-CM | POA: Diagnosis not present

## 2020-10-26 NOTE — Addendum Note (Signed)
Addended by: Wonda Horner on: 10/26/2020 02:17 PM   Modules accepted: Orders

## 2020-10-27 ENCOUNTER — Other Ambulatory Visit (INDEPENDENT_AMBULATORY_CARE_PROVIDER_SITE_OTHER): Payer: Self-pay | Admitting: Otolaryngology

## 2020-11-20 DIAGNOSIS — Z8546 Personal history of malignant neoplasm of prostate: Secondary | ICD-10-CM | POA: Diagnosis not present

## 2020-11-20 DIAGNOSIS — R3915 Urgency of urination: Secondary | ICD-10-CM | POA: Diagnosis not present

## 2020-11-20 DIAGNOSIS — N3000 Acute cystitis without hematuria: Secondary | ICD-10-CM | POA: Diagnosis not present

## 2020-11-20 DIAGNOSIS — R3912 Poor urinary stream: Secondary | ICD-10-CM | POA: Diagnosis not present

## 2020-11-26 DIAGNOSIS — E278 Other specified disorders of adrenal gland: Secondary | ICD-10-CM | POA: Diagnosis not present

## 2020-11-26 DIAGNOSIS — D3500 Benign neoplasm of unspecified adrenal gland: Secondary | ICD-10-CM | POA: Diagnosis not present

## 2020-11-26 DIAGNOSIS — N3000 Acute cystitis without hematuria: Secondary | ICD-10-CM | POA: Diagnosis not present

## 2020-11-26 DIAGNOSIS — R3129 Other microscopic hematuria: Secondary | ICD-10-CM | POA: Diagnosis not present

## 2020-11-26 DIAGNOSIS — I700 Atherosclerosis of aorta: Secondary | ICD-10-CM | POA: Diagnosis not present

## 2020-11-26 DIAGNOSIS — R3915 Urgency of urination: Secondary | ICD-10-CM | POA: Diagnosis not present

## 2020-11-26 DIAGNOSIS — R8271 Bacteriuria: Secondary | ICD-10-CM | POA: Diagnosis not present

## 2020-11-29 DIAGNOSIS — N3000 Acute cystitis without hematuria: Secondary | ICD-10-CM | POA: Diagnosis not present

## 2020-11-29 DIAGNOSIS — R8279 Other abnormal findings on microbiological examination of urine: Secondary | ICD-10-CM | POA: Diagnosis not present

## 2020-11-29 DIAGNOSIS — R3121 Asymptomatic microscopic hematuria: Secondary | ICD-10-CM | POA: Diagnosis not present

## 2020-12-06 DIAGNOSIS — D3500 Benign neoplasm of unspecified adrenal gland: Secondary | ICD-10-CM | POA: Diagnosis not present

## 2020-12-11 ENCOUNTER — Other Ambulatory Visit: Payer: Self-pay | Admitting: Adult Health

## 2020-12-11 DIAGNOSIS — E278 Other specified disorders of adrenal gland: Secondary | ICD-10-CM

## 2020-12-18 NOTE — Progress Notes (Deleted)
Cardiology Office Note   Date:  12/18/2020   ID:  Aaqil, Massucci 1946-11-07, MRN [id number]  PCP:  Swaziland, [name] G, MD  Cardiologist:   Cortavious Nix [place], MD   No chief complaint on file.     History of Present Illness: [name] is a 75 y.o. male who is seen for follow up CAD. He has a history of DM on insulin, CKD, HTN and HLD. He has known CAD s/p CABG in 2004. Previously followed by Dr. Dorthey [facility] [place]. Last Cardiac cath in July 2019 for evaluation of chest pain showed 90% proximal LAD, occluded nondominant LCx and occluded RCA. The grafts were patent including LIMA to the LAD, SVG to RCA and SVG to ramus with 40-50% proximal stenosis. Normal EF and EDP. He is a [place] vet and has a history of PTSD and Agent Orange exposure.   He  moved from [place] to be close to family. 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 He is married. He is very sedentary. Notes his legs get weak and he uses a cane to walk.  Has had some falls in the past. Denies any anginal symptoms. Does complain of his feet being cold and stinging.   He was seen in the ED in October with atypical chest pain. troponins and Ecg unremarkable. Placed on PPI. Lipase was elevated. Subsequent level still elevated but improved. 270>>153. MRI of abdomen planned.     Past Medical History:  Diagnosis Date  . Arthritis   . Cancer (HCC)   . Chronic kidney disease   . Depression   . Diabetes mellitus without complication (HCC)   . Frequent headaches   . History of chicken pox   . History of fainting spells of unknown cause   . Hyperlipidemia   . Hypertension   . PTSD (post-traumatic stress disorder)     Past Surgical History:  Procedure Laterality Date  . CARDIAC SURGERY     Triple Bypass  . CHOLECYSTECTOMY  2010  . CORONARY ARTERY BYPASS GRAFT  2004  . TONSILLECTOMY  1958     Current Outpatient Medications  Medication Sig Dispense  Refill  . aspirin 81 MG chewable tablet Chew by mouth daily.    Marland Kitchen atorvastatin (LIPITOR) 80 MG tablet Take 40 mg by mouth daily.    . Calcium Carbonate (CALCIUM 600 PO) Take by mouth daily.    . CORTISPORIN-TC 3.02-14-09-0.5 MG/ML OTIC suspension     . FLUoxetine (PROZAC) 20 MG capsule Take 1 capsule by mouth in the morning and at bedtime.    . fluticasone (FLONASE) 50 MCG/ACT nasal spray INSTILL 2 SPRAYS IN EACH NOSTRIL EVERY EVENING 16 g 3  . hydrochlorothiazide (HYDRODIURIL) 25 MG tablet Take 25 mg by mouth daily.    . insulin glargine (LANTUS) 100 UNIT/ML injection Inject 0.75 mLs (75 Units total) into the skin daily. 10 mL 3  . isosorbide mononitrate (IMDUR) 30 MG 24 hr tablet Take 30 mg by mouth daily.    Marland Kitchen losartan (COZAAR) 100 MG tablet Take 100 mg by mouth daily.    . metoprolol tartrate (LOPRESSOR) 25 MG tablet Take 25 mg by mouth 2 (two) times daily.    . Multiple Vitamin (MULTI-VITAMINS) TABS Take by mouth.    . nitroGLYCERIN (NITROSTAT) 0.4 MG SL tablet Place 0.4 mg under the tongue every 5 (five) minutes as needed for chest pain.    Marland Kitchen omeprazole (PRILOSEC)  20 MG capsule Take 1 capsule (20 mg total) by mouth daily. 30 capsule 0  . ranitidine (ZANTAC) 150 MG capsule Take 150 mg by mouth 2 (two) times daily.    . tamsulosin (FLOMAX) 0.4 MG CAPS capsule Take 0.4 mg by mouth.    . traZODone (DESYREL) 100 MG tablet Take 100 mg by mouth at bedtime.     No current facility-administered medications for this visit.    Allergies:   Patient has no known allergies.    Social History:  The patient  reports that he quit smoking about 45 years ago. He has never used smokeless tobacco. He reports previous alcohol use. He reports that he does not use drugs.   Family History:  The patient's family history includes Alcohol abuse in his brother; Arthritis in his mother and sister; COPD in his brother; Cancer in his brother, mother, and sister; Depression in his daughter; Heart attack in his  brother, brother, and mother; Heart disease in his brother, brother, and mother; Hyperlipidemia in his mother; Hypertension in his mother; Learning disabilities in his brother.    ROS:  Please see the history of present illness.   Otherwise, review of systems are positive for none.   All other systems are reviewed and negative.    PHYSICAL EXAM: VS:  There were no vitals taken for this visit. , BMI There is no height or weight on file to calculate BMI. GEN: Well nourished, obese, in no acute distress  HEENT: normal  Neck: no JVD, carotid bruits, or masses Cardiac: RRR; no murmurs, rubs, or gallops,no edema  Respiratory:  clear to auscultation bilaterally, normal work of breathing GI: soft, nontender, nondistended, + BS MS: no deformity or atrophy  Skin: warm and dry, no rash Neuro:  Strength and sensation are intact Psych: euthymic mood, full affect   EKG:  EKG is ordered today. The ekg ordered today demonstrates NSR with rate 77. Nonspecific ST abnormality. I have personally reviewed and interpreted this study.    Recent Labs: 10/12/2020: ALT 19; BUN 16; Creatinine, Ser 1.45; Hemoglobin 11.6; Platelets 177; Potassium 3.4; Sodium 139 A1c 6.3%   Lipid Panel    Component Value Date/Time   CHOL 113 10/10/2020 0940   CHOL 101 12/16/2018 1003   TRIG 166 (H) 10/10/2020 0940   HDL 34 (L) 10/10/2020 0940   HDL 38 (L) 12/16/2018 1003   CHOLHDL 3.3 10/10/2020 0940   LDLCALC 55 10/10/2020 0940      Wt Readings from Last 3 Encounters:  10/17/20 283 lb (128.4 kg)  10/12/20 283 lb 4.7 oz (128.5 kg)  10/10/20 283 lb 6 oz (128.5 kg)      Other studies Reviewed: Additional studies/ records that were reviewed today include:  Labs dated 06/26/17: Hgb 12.8. Creatinine 1.43. potassium 3.4.    ASSESSMENT AND PLAN:  1.  CAD s/p remote CABG in 2004. Cardiac cath in July 2019 showed patent grafts. He is asymptomatic.  Continue ASA, statin, Imdur, metoprolol. Follow up in one year 2. DM  on insulin with complication of CKD and neuropathy 3. HLD on Zocor.  4. HTN controlled.  5. PTSD   Current medicines are reviewed at length with the patient today.  The patient does not have concerns regarding medicines.  The following changes have been made:  no change  Labs/ tests ordered today include:  No orders of the defined types were placed in this encounter.    Disposition:   FU with me in 1 year  Signed, [name] Beckner [place], MD  12/18/2020 9:38 AM    [facility] [address] [place], [place], [address] Phone [phone], Fax [phone]

## 2020-12-20 ENCOUNTER — Other Ambulatory Visit: Payer: Self-pay

## 2020-12-20 ENCOUNTER — Ambulatory Visit (HOSPITAL_COMMUNITY)
Admission: RE | Admit: 2020-12-20 | Discharge: 2020-12-20 | Disposition: A | Discharge status: Home / Self Care | Payer: Medicare Other | Source: Ambulatory Visit | Attending: Adult Health | Admitting: Adult Health

## 2020-12-20 DIAGNOSIS — K862 Cyst of pancreas: Secondary | ICD-10-CM | POA: Diagnosis not present

## 2020-12-20 DIAGNOSIS — K8689 Other specified diseases of pancreas: Secondary | ICD-10-CM | POA: Diagnosis not present

## 2020-12-20 DIAGNOSIS — D3500 Benign neoplasm of unspecified adrenal gland: Secondary | ICD-10-CM | POA: Diagnosis not present

## 2020-12-20 DIAGNOSIS — E278 Other specified disorders of adrenal gland: Secondary | ICD-10-CM | POA: Insufficient documentation

## 2020-12-20 IMAGING — MR MR ABDOMEN WO/W CM
25 series · 48 of 48 positions shown · IV contrast (10.0 mL GADAVIST)
Comparison: None.

CLINICAL DATA: Is

EXAM:
MRI ABDOMEN WITHOUT AND WITH CONTRAST
TECHNIQUE: Multiplanar multisequence MR imaging of the abdomen was performed
both before and after the administration of intravenous contrast.
CONTRAST:  10mL GADAVIST GADOBUTROL 1 MMOL/ML IV SOLN

[Series 3: T2 · coronal · 6.0mm · 1.56mm/px · 2 of 42 slices shown (1 of 2)]
[im 1/42]
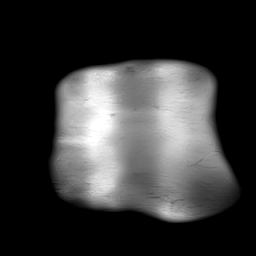
[im 42/42]
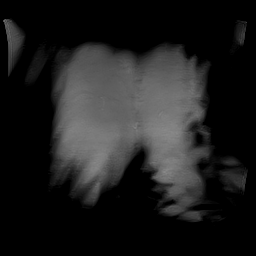

[Series 5: T2 fat-sat · axial · 6.0mm · 1.25mm/px · z∈[-95,+200]mm · 2 of 42 slices shown]
[im 1/42]
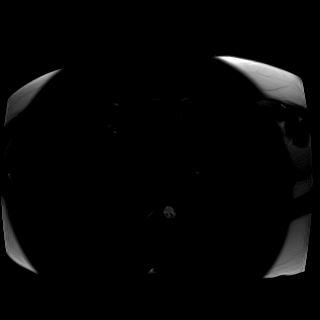
[im 42/42]
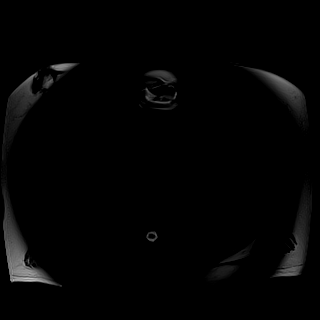

[Series 6: T1 · axial · 3.0mm · 1.25mm/px · z∈[-127,+182]mm · 2 of 104 slices shown (1 of 4)]
[im 1/104]
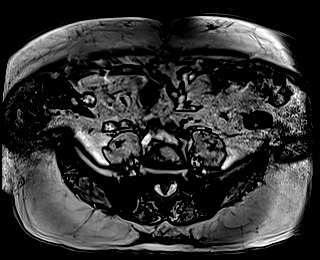
[im 104/104]
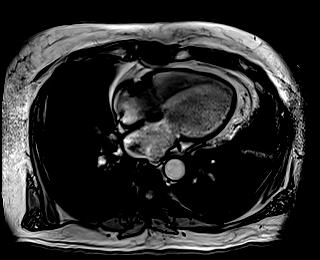

[Series 7: T1 · axial · 3.0mm · 1.25mm/px · z∈[-127,+182]mm · 2 of 104 slices shown (2 of 4)]
[im 1/104]
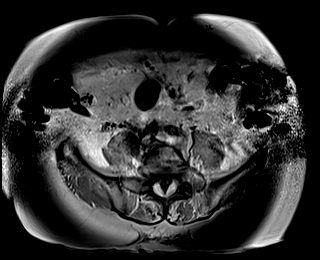
[im 104/104]
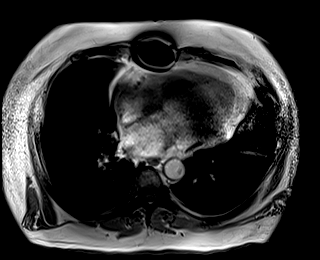

[Series 8: T1 · coronal · 3.0mm · 1.25mm/px · 2 of 88 slices shown (3 of 4)]
[im 1/88]
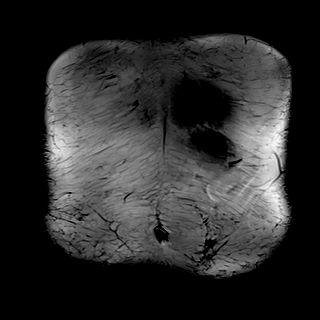
[im 88/88]
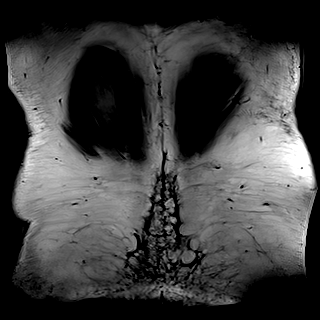

[Series 9: T1 · coronal · 3.0mm · 1.25mm/px · 2 of 88 slices shown (4 of 4)]
[im 1/88]
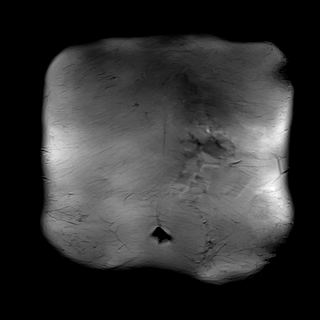
[im 88/88]
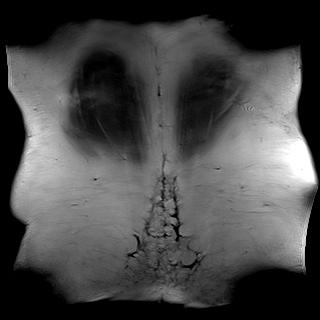

[Series 10: DWI · axial · 6.0mm · 1.49mm/px · z∈[-107,+195]mm · 2 of 86 slices shown (1 of 2)]
[im 1/86]
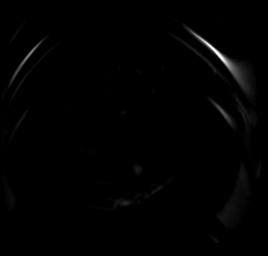
[im 86/86]
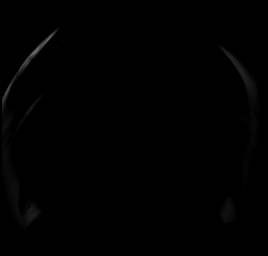

[Series 11: DWI · axial · 6.0mm · 1.49mm/px · 1 of 43 slices shown (2 of 2)]
[im 1/43]
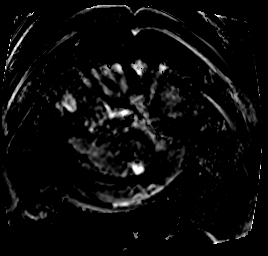

[Series 12: T1 dynamic · axial · 3.0mm · 1.25mm/px · z∈[-104,+181]mm · 2 of 96 slices shown (1 of 16)]
[im 1/96]
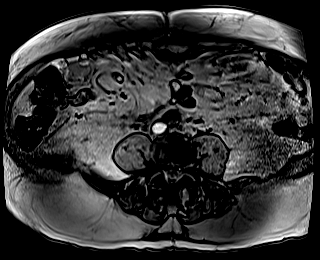
[im 96/96]
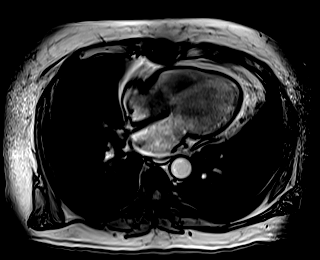

[Series 13: T1 dynamic · axial · 3.0mm · 1.25mm/px · z∈[-104,+181]mm · 2 of 96 slices shown (2 of 16)]
[im 1/96]
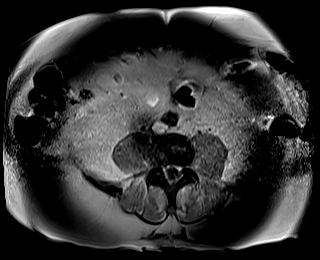
[im 96/96]
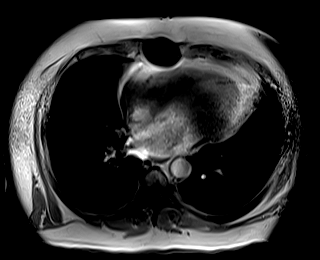

[Series 15: T1 dynamic · axial · 3.0mm · 1.25mm/px · z∈[-104,+181]mm · 2 of 96 slices shown (3 of 16)]
[im 1/96]
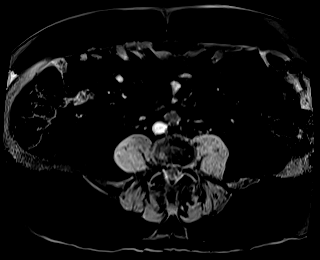
[im 96/96]
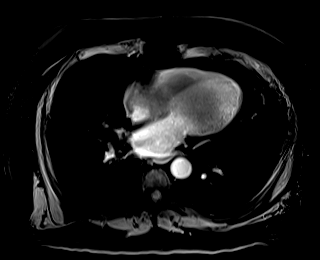

[Series 17: T1 dynamic · axial · 3.0mm · 1.25mm/px · z∈[-104,+181]mm · 2 of 96 slices shown (4 of 16)]
[im 1/96]
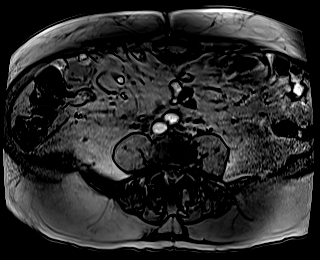
[im 96/96]
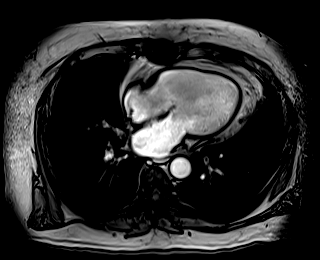

[Series 18: T1 dynamic · axial · 3.0mm · 1.25mm/px · z∈[-104,+181]mm · 2 of 96 slices shown (5 of 16)]
[im 1/96]
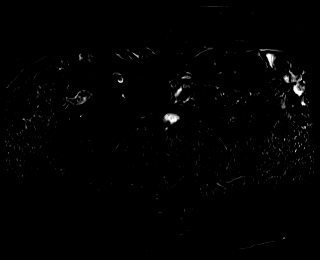
[im 96/96]
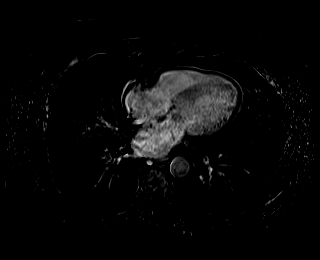

[Series 19: T1 dynamic · axial · 3.0mm · 1.25mm/px · z∈[-104,+181]mm · 2 of 96 slices shown (6 of 16)]
[im 1/96]
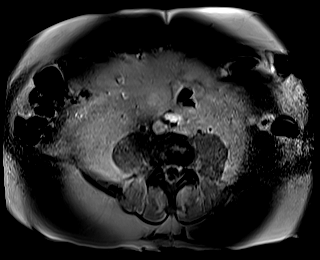
[im 96/96]
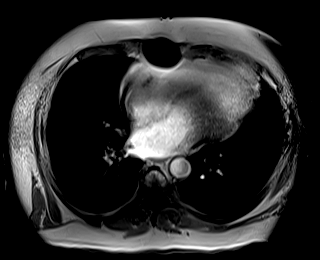

[Series 20: T1 dynamic · axial · 3.0mm · 1.25mm/px · z∈[-104,+181]mm · 2 of 96 slices shown (7 of 16)]
[im 1/96]
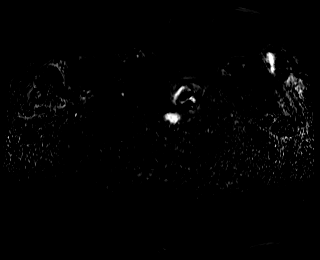
[im 96/96]
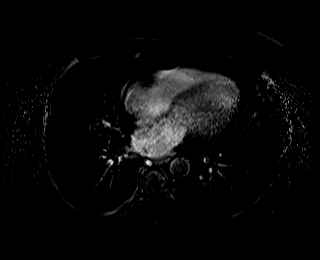

[Series 23: T1 dynamic · axial · 3.0mm · 1.25mm/px · z∈[-104,+181]mm · 2 of 96 slices shown (8 of 16)]
[im 1/96]
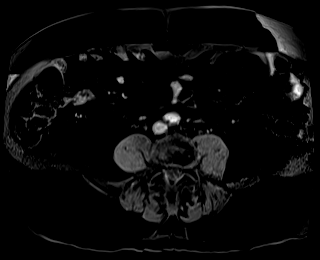
[im 96/96]
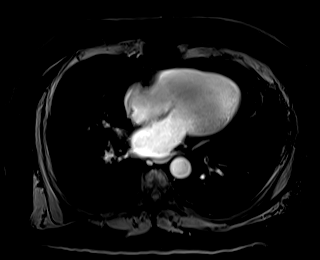

[Series 24: T1 dynamic · axial · 3.0mm · 1.25mm/px · z∈[-104,+181]mm · 2 of 96 slices shown (9 of 16)]
[im 1/96]
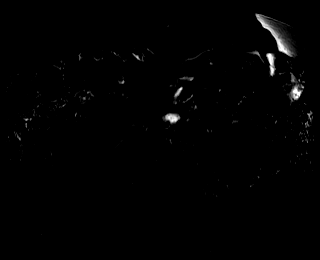
[im 96/96]
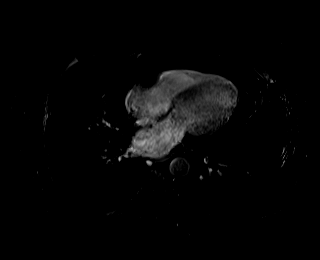

[Series 31: T1 dynamic · axial · 3.0mm · 1.25mm/px · z∈[-104,+181]mm · 2 of 96 slices shown (10 of 16)]
[im 1/96]
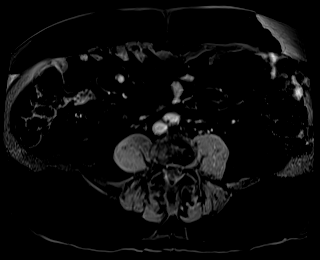
[im 96/96]
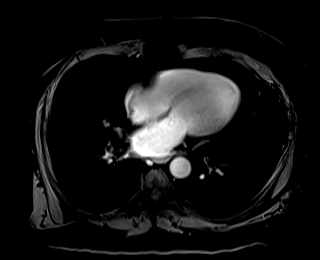

[Series 32: T1 dynamic · axial · 3.0mm · 1.25mm/px · z∈[-104,+181]mm · 2 of 96 slices shown (11 of 16)]
[im 1/96]
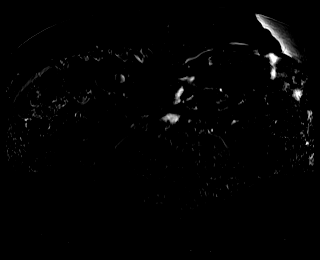
[im 96/96]
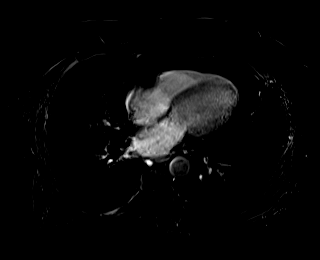

[Series 39: T1 dynamic · axial · 3.0mm · 1.25mm/px · z∈[-104,+181]mm · 2 of 96 slices shown (12 of 16)]
[im 1/96]
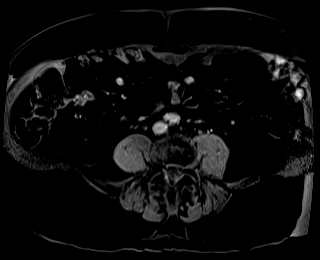
[im 96/96]
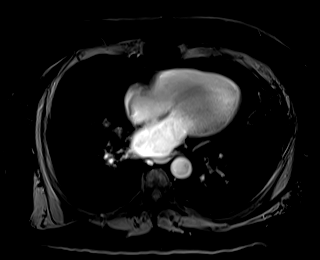

[Series 40: T1 dynamic · axial · 3.0mm · 1.25mm/px · z∈[-104,+181]mm · 2 of 96 slices shown (13 of 16)]
[im 1/96]
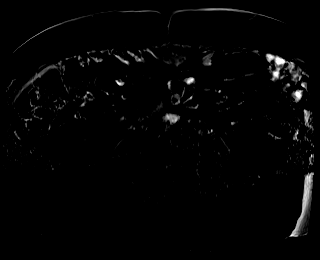
[im 96/96]
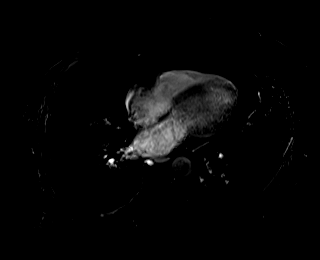

[Series 42: T1 dynamic · coronal · 3.0mm · 1.25mm/px · 2 of 96 slices shown (14 of 16)]
[im 1/96]
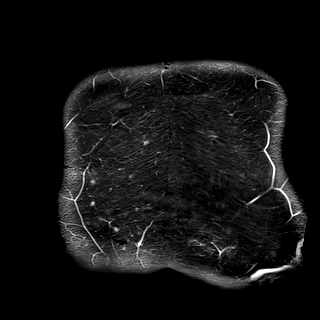
[im 96/96]
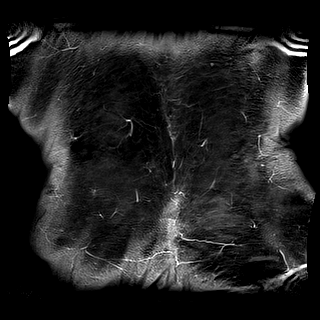

[Series 43: T2 · axial · 6.0mm · 1.56mm/px · 1 of 42 slices shown (2 of 2)]
[im 1/42]
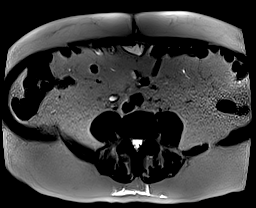

[Series 50: T1 dynamic · axial · 3.0mm · 1.25mm/px · z∈[-104,+181]mm · 2 of 96 slices shown (15 of 16)]
[im 1/96]
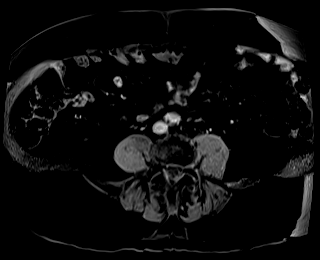
[im 96/96]
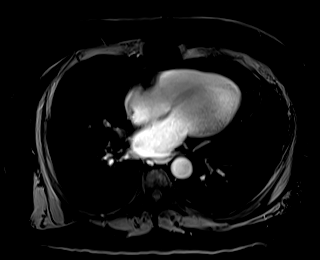

[Series 51: T1 dynamic · axial · 3.0mm · 1.25mm/px · z∈[-104,+181]mm · 2 of 96 slices shown (16 of 16)]
[im 1/96]
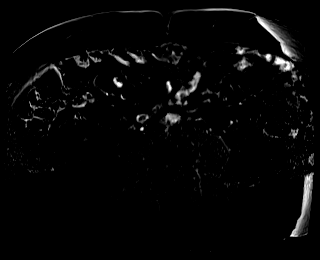
[im 96/96]
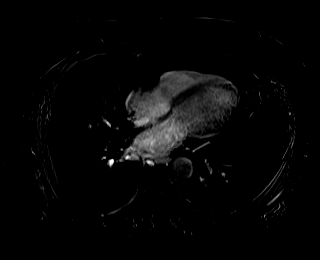

[48 of 48 positions shown; findings below may reference images not displayed]

FINDINGS: Lower chest:  Lung bases are clear.

Hepatobiliary: Normal hepatic parenchyma. No enhancing hepatic
lesion. Postcholecystectomy. No biliary duct dilatation.

Pancreas: Within the mid body of the pancreas there is an abrupt
loss of continuity of the pancreatic duct (image 20/43) on T2
weighted imaging. Upstream of this loss of continuity the duct
measures 3 mm and downstream the duct measures 2 mm. No discrete
lesion is identified on the T1 pre and postcontrast imaging.

There is a small pancreatic cyst adjacent to this mild duct
dilatation measuring 7 mm on image 20/coronal MR series 3).

Spleen: Normal spleen.

Adrenals/urinary tract: There is enlargement of the LEFT adrenal
gland to 18 mm x 16 mm. There is loss of signal intensity on opposed
phase imaging (series 6 and 7) consistent with intracellular lipid
of benign adenoma.

The RIGHT adrenal glands normal.  No enhancing hepatic lesion.

Stomach/Bowel: Stomach and limited of the small bowel is
unremarkable

Vascular/Lymphatic: Abdominal aortic normal caliber. No
retroperitoneal periportal lymphadenopathy.

Musculoskeletal: No aggressive osseous lesion
IMPRESSION: 1. Loss of continuity of the pancreatic duct in the mid pancreatic
body with mild upstream duct dilatation. No discrete lesion
identified however cannot exclude an obstructing lesion. There is a
small cystic lesion adjacent the duct dilatation which could
indicate a intraductal papillary mucinous tumor. Cannot exclude a
more aggressive obstructive neoplasm (adenocarcinoma). Recommend GI
consultation, correlation with tumor markers (CA 19 9) and consider
endoscopic ultrasound for further evaluation. At minimum, recommend
follow-up MRI pancreatic protocol without contrast in 1-3 months.
2. Enlargement of the LEFT adrenal gland consistent benign adenoma.
3. Postcholecystectomy.

These results will be called to the ordering clinician or
representative by the Radiologist Assistant, and communication
documented in the PACS or [REDACTED].

## 2020-12-20 MED ORDER — GADOBUTROL 1 MMOL/ML IV SOLN
10.0000 mL | Freq: Once | INTRAVENOUS | Status: AC | PRN
  Administered 2020-12-20: 10 mL via INTRAVENOUS

## 2020-12-21 ENCOUNTER — Encounter: Payer: Self-pay | Admitting: Family Medicine

## 2020-12-21 ENCOUNTER — Ambulatory Visit: Payer: Medicare Other | Admitting: Cardiology

## 2020-12-26 ENCOUNTER — Other Ambulatory Visit: Payer: Self-pay | Admitting: Family Medicine

## 2020-12-26 DIAGNOSIS — K8689 Other specified diseases of pancreas: Secondary | ICD-10-CM

## 2021-01-06 ENCOUNTER — Encounter: Payer: Self-pay | Admitting: Family Medicine

## 2021-01-07 ENCOUNTER — Telehealth: Payer: Self-pay

## 2021-01-07 NOTE — Telephone Encounter (Signed)
Hi Dr. Ardis Hughs and Dr. Rush Landmark, we have received a referral from patient's PCP for a pancreatic duct dilation after abdominal imaging findings. Records are in Epic. Could you please review them and advise on scheduling? Thank you.

## 2021-01-07 NOTE — Telephone Encounter (Signed)
Thank you, Patty. I will send them a message.

## 2021-01-07 NOTE — Telephone Encounter (Signed)
Do we have records? If so, can you send those to Dr Ardis Hughs or [place]?

## 2021-01-07 NOTE — Telephone Encounter (Signed)
Hi Patty, we have received a referral from pt's PCP for either Dr. Rush Landmark or Ardis Hughs for Pancreatic duct dilation. Please advise on scheduling. Thank you.

## 2021-01-09 ENCOUNTER — Other Ambulatory Visit: Payer: Self-pay

## 2021-01-09 DIAGNOSIS — R748 Abnormal levels of other serum enzymes: Secondary | ICD-10-CM

## 2021-01-09 DIAGNOSIS — Q453 Other congenital malformations of pancreas and pancreatic duct: Secondary | ICD-10-CM

## 2021-01-09 NOTE — Telephone Encounter (Signed)
[name], He needs first available upper endoscopic ultrasound appointment with either myself or Gabe for abnormal pancreas duct, elevated lipase.  Thanks

## 2021-01-09 NOTE — Telephone Encounter (Signed)
Dr Rush Landmark you have no available spots left during your hospital week according to the endo schedulers.  The only day you have would be to add to 1/31 at [facility] after the case that is already scheduled. Please advise

## 2021-01-09 NOTE — Telephone Encounter (Signed)
Please put him on my schedule during hospital next week first case to expedite things.  Not so severe dilation proximally but certainly need to ensure there isn't a mass-lesion.  Thanks. GM

## 2021-01-09 NOTE — Telephone Encounter (Signed)
Per V.O. from Dr Rush Landmark the pt can be added to 01/15/21 at 730 am at [facility] (per Sherolyn Buba). COVID test on 01/11/21 at 950 am.  EUS scheduled, pt instructed and medications reviewed.  Patient to call with any questions or concerns.

## 2021-01-10 NOTE — Telephone Encounter (Signed)
Thanks for update. GM 

## 2021-01-11 ENCOUNTER — Other Ambulatory Visit (HOSPITAL_COMMUNITY)
Admission: RE | Admit: 2021-01-11 | Discharge: 2021-01-11 | Disposition: A | Discharge status: Home / Self Care | Payer: Medicare Other | Source: Ambulatory Visit | Attending: Gastroenterology | Admitting: Gastroenterology

## 2021-01-11 DIAGNOSIS — Z20822 Contact with and (suspected) exposure to covid-19: Secondary | ICD-10-CM | POA: Insufficient documentation

## 2021-01-11 DIAGNOSIS — Z01812 Encounter for preprocedural laboratory examination: Secondary | ICD-10-CM | POA: Diagnosis not present

## 2021-01-11 LAB — SARS CORONAVIRUS 2 (TAT 6-24 HRS): SARS Coronavirus 2: NEGATIVE

## 2021-01-14 ENCOUNTER — Encounter (HOSPITAL_COMMUNITY): Payer: Self-pay | Admitting: Gastroenterology

## 2021-01-14 NOTE — Anesthesia Preprocedure Evaluation (Addendum)
Anesthesia Evaluation  Patient identified by MRN, date of birth, ID band Patient awake    Reviewed: Allergy & Precautions, Patient's Chart, lab work & pertinent test results, reviewed documented beta blocker date and time   History of Anesthesia Complications (+) DIFFICULT AIRWAY and history of anesthetic complications (was told "it was hard to get the tube down his throat" during CABG in 90s)  Airway Mallampati: III  TM Distance: >3 FB Neck ROM: Full    Dental  (+) Missing,    Pulmonary sleep apnea and Continuous Positive Airway Pressure Ventilation , former smoker,    Pulmonary exam normal        Cardiovascular hypertension, Pt. on medications and Pt. on home beta blockers + CAD and + CABG  Normal cardiovascular exam     Neuro/Psych  Headaches, Anxiety Depression    GI/Hepatic Neg liver ROS, GERD  Controlled and Medicated,abnormal pancreatic duct- elevated lipase   Endo/Other  diabetes, Type 2, Insulin Dependent  Renal/GU Renal InsufficiencyRenal disease  negative genitourinary   Musculoskeletal negative musculoskeletal ROS (+)   Abdominal   Peds  Hematology negative hematology ROS (+)   Anesthesia Other Findings Day of surgery medications reviewed with patient.  Reproductive/Obstetrics negative OB ROS                            Anesthesia Physical Anesthesia Plan  ASA: III  Anesthesia Plan: MAC   Post-op Pain Management:    Induction:   PONV Risk Score and Plan: 1 and Treatment may vary due to age or medical condition and Propofol infusion  Airway Management Planned: Natural Airway and Nasal Cannula  Additional Equipment: None  Intra-op Plan:   Post-operative Plan:   Informed Consent: I have reviewed the patients History and Physical, chart, labs and discussed the procedure including the risks, benefits and alternatives for the proposed anesthesia with the patient or  authorized representative who has indicated his/her understanding and acceptance.       Plan Discussed with: CRNA  Anesthesia Plan Comments:        Anesthesia Quick Evaluation

## 2021-01-14 NOTE — Progress Notes (Signed)
Spoke with pt and his wife for pre-op call. Pt has hx of CAD with CABG many years ago. Denies any recent heart issues. Pt's cardiologist is Dr. Peter [name]. Pt is a type 2 diabetic. Last A1C was 6.3 on 10/10/20. He states his fasting blood sugar is usually around 140. Instructed pt to take 1/2 of his regular dose of Lantus in the AM, he will take 37 units. Instructed him to check his blood sugar when he gets up and every 2 hours until he leaves for the hospital. If blood sugar is 70 or below, treat with 1/2 cup of clear juice (apple or cranberry) and recheck blood sugar 15 minutes after drinking juice. Instructed pt to let his nurse know on arrival if he had to drink the juice. Pt voiced understanding.  Covid test done  01/11/21 and it's negative.  Pt states he's been in quarantine since the test was done and understands that he stays in quarantine until he comes to the hospital tomorrow.  EKG - 10/17/20 CXR - 10/12/20 Cath - 06/26/17 Stress - 2015

## 2021-01-15 ENCOUNTER — Ambulatory Visit (HOSPITAL_COMMUNITY): Payer: Medicare Other | Admitting: Anesthesiology

## 2021-01-15 ENCOUNTER — Ambulatory Visit (HOSPITAL_COMMUNITY)
Admission: RE | Admit: 2021-01-15 | Discharge: 2021-01-15 | Disposition: A | Discharge status: Home / Self Care | Payer: Medicare Other | Attending: Gastroenterology | Admitting: Gastroenterology

## 2021-01-15 ENCOUNTER — Other Ambulatory Visit: Payer: Self-pay

## 2021-01-15 ENCOUNTER — Encounter (HOSPITAL_COMMUNITY): Payer: Self-pay | Admitting: Gastroenterology

## 2021-01-15 ENCOUNTER — Encounter (HOSPITAL_COMMUNITY)
Admission: RE | Disposition: A | Discharge status: Home / Self Care | Payer: Self-pay | Source: Home / Self Care | Attending: Gastroenterology

## 2021-01-15 DIAGNOSIS — R1013 Epigastric pain: Secondary | ICD-10-CM | POA: Diagnosis not present

## 2021-01-15 DIAGNOSIS — Z8261 Family history of arthritis: Secondary | ICD-10-CM | POA: Diagnosis not present

## 2021-01-15 DIAGNOSIS — K8689 Other specified diseases of pancreas: Secondary | ICD-10-CM | POA: Insufficient documentation

## 2021-01-15 DIAGNOSIS — Z811 Family history of alcohol abuse and dependence: Secondary | ICD-10-CM | POA: Insufficient documentation

## 2021-01-15 DIAGNOSIS — K862 Cyst of pancreas: Secondary | ICD-10-CM

## 2021-01-15 DIAGNOSIS — Z951 Presence of aortocoronary bypass graft: Secondary | ICD-10-CM | POA: Diagnosis not present

## 2021-01-15 DIAGNOSIS — Z825 Family history of asthma and other chronic lower respiratory diseases: Secondary | ICD-10-CM | POA: Diagnosis not present

## 2021-01-15 DIAGNOSIS — Z888 Allergy status to other drugs, medicaments and biological substances status: Secondary | ICD-10-CM | POA: Diagnosis not present

## 2021-01-15 DIAGNOSIS — Z809 Family history of malignant neoplasm, unspecified: Secondary | ICD-10-CM | POA: Insufficient documentation

## 2021-01-15 DIAGNOSIS — R9389 Abnormal findings on diagnostic imaging of other specified body structures: Secondary | ICD-10-CM | POA: Diagnosis not present

## 2021-01-15 DIAGNOSIS — C257 Malignant neoplasm of other parts of pancreas: Secondary | ICD-10-CM | POA: Diagnosis not present

## 2021-01-15 DIAGNOSIS — Z818 Family history of other mental and behavioral disorders: Secondary | ICD-10-CM | POA: Diagnosis not present

## 2021-01-15 DIAGNOSIS — K2970 Gastritis, unspecified, without bleeding: Secondary | ICD-10-CM

## 2021-01-15 DIAGNOSIS — Z8349 Family history of other endocrine, nutritional and metabolic diseases: Secondary | ICD-10-CM | POA: Insufficient documentation

## 2021-01-15 DIAGNOSIS — Z810 Family history of intellectual disabilities: Secondary | ICD-10-CM | POA: Diagnosis not present

## 2021-01-15 DIAGNOSIS — Z8249 Family history of ischemic heart disease and other diseases of the circulatory system: Secondary | ICD-10-CM | POA: Diagnosis not present

## 2021-01-15 DIAGNOSIS — R748 Abnormal levels of other serum enzymes: Secondary | ICD-10-CM | POA: Diagnosis not present

## 2021-01-15 DIAGNOSIS — K2950 Unspecified chronic gastritis without bleeding: Secondary | ICD-10-CM | POA: Insufficient documentation

## 2021-01-15 DIAGNOSIS — K3189 Other diseases of stomach and duodenum: Secondary | ICD-10-CM | POA: Diagnosis not present

## 2021-01-15 DIAGNOSIS — F418 Other specified anxiety disorders: Secondary | ICD-10-CM | POA: Diagnosis not present

## 2021-01-15 DIAGNOSIS — Z794 Long term (current) use of insulin: Secondary | ICD-10-CM | POA: Insufficient documentation

## 2021-01-15 DIAGNOSIS — Z87891 Personal history of nicotine dependence: Secondary | ICD-10-CM | POA: Diagnosis not present

## 2021-01-15 HISTORY — DX: Atherosclerotic heart disease of native coronary artery without angina pectoris: I25.10

## 2021-01-15 HISTORY — DX: Pneumonia, unspecified organism: J18.9

## 2021-01-15 HISTORY — DX: Fatty (change of) liver, not elsewhere classified: K76.0

## 2021-01-15 HISTORY — DX: Sleep apnea, unspecified: G47.30

## 2021-01-15 HISTORY — DX: Constipation, unspecified: K59.00

## 2021-01-15 HISTORY — DX: Anemia, unspecified: D64.9

## 2021-01-15 HISTORY — DX: Gastro-esophageal reflux disease without esophagitis: K21.9

## 2021-01-15 HISTORY — PX: BIOPSY: SHX5522

## 2021-01-15 HISTORY — PX: ESOPHAGOGASTRODUODENOSCOPY (EGD) WITH PROPOFOL: SHX5813

## 2021-01-15 HISTORY — PX: FINE NEEDLE ASPIRATION: SHX5430

## 2021-01-15 HISTORY — PX: EUS: SHX5427

## 2021-01-15 HISTORY — DX: Other complications of anesthesia, initial encounter: T88.59XA

## 2021-01-15 LAB — GLUCOSE, CAPILLARY: Glucose-Capillary: 121 mg/dL — ABNORMAL HIGH (ref 70–99)

## 2021-01-15 SURGERY — ESOPHAGOGASTRODUODENOSCOPY (EGD) WITH PROPOFOL
Anesthesia: Monitor Anesthesia Care

## 2021-01-15 MED ORDER — PANTOPRAZOLE SODIUM 40 MG PO TBEC: 40.0000 mg | DELAYED_RELEASE_TABLET | Freq: Every day | ORAL | 6 refills | Status: DC

## 2021-01-15 MED ORDER — PROPOFOL 10 MG/ML IV BOLUS
INTRAVENOUS | Status: DC | PRN
  Administered 2021-01-15 (×2): 20 mg via INTRAVENOUS

## 2021-01-15 MED ORDER — LACTATED RINGERS IV SOLN: INTRAVENOUS | Status: DC

## 2021-01-15 MED ORDER — PROPOFOL 500 MG/50ML IV EMUL
INTRAVENOUS | Status: DC | PRN
  Administered 2021-01-15: 125 ug/kg/min via INTRAVENOUS

## 2021-01-15 MED ORDER — LIDOCAINE 2% (20 MG/ML) 5 ML SYRINGE
INTRAMUSCULAR | Status: DC | PRN
  Administered 2021-01-15: 100 mg via INTRAVENOUS

## 2021-01-15 SURGICAL SUPPLY — 14 items

## 2021-01-15 NOTE — Op Note (Signed)
[facility] Patient Name: [name] Procedure Date : 01/15/2021 MRN: [id number] Attending MD: Justice Britain , MD Date of Birth: 12-09-1946 CSN: [id number] Age: 75 Admit Type: Inpatient Procedure:                Upper EUS Indications:              Dilated pancreatic duct on MRCP, Dyspepsia Providers:                Justice Britain, MD, Kary Kos, RN, Lesia Sago, Technician Referring MD:             [name] G. [name] Medicines:                Monitored Anesthesia Care Complications:            No immediate complications. Estimated Blood Loss:     Estimated blood loss was minimal. Procedure:                Pre-Anesthesia Assessment:                           - Prior to the procedure, a History and Physical                            was performed, and patient medications and                            allergies were reviewed. The patient's tolerance of                            previous anesthesia was also reviewed. The risks                            and benefits of the procedure and the sedation                            options and risks were discussed with the patient.                            All questions were answered, and informed consent                            was obtained. Prior Anticoagulants: The patient has                            taken no previous anticoagulant or antiplatelet                            agents. ASA Grade Assessment: III - A patient with                            severe systemic disease. After reviewing the risks  and benefits, the patient was deemed in                            satisfactory condition to undergo the procedure.                           After obtaining informed consent, the endoscope was                            passed under direct vision. Throughout the                            procedure, the patient's blood pressure, pulse, and                             oxygen saturations were monitored continuously. The                            [id number]-H190 ([id number]) Olympus gastroscope was                            introduced through the mouth, and advanced to the                            second part of duodenum. The [id number]-Q180V ([id number])                            Olympus Duodenoscope was introduced through the                            mouth, and advanced to the second part of duodenum.                            The GF-[id number] ([id number]) Olympus Linear EUS was                            introduced through the mouth, and advanced to the                            duodenum for ultrasound examination from the                            stomach and duodenum. The GF-[id number]-AL5 ([id number])                            Olympus Radial EUS scope was introduced through the                            mouth, and advanced to the duodenum for ultrasound                            examination. After obtaining informed consent, the  endoscope was passed under direct vision.                            Throughout the procedure, the patient's blood                            pressure, pulse, and oxygen saturations were                            monitored continuously.The upper EUS was                            accomplished without difficulty. The patient                            tolerated the procedure. Scope In: Scope Out: Findings:      ENDOSCOPIC FINDING: :      No gross lesions were noted in the entire esophagus.      The Z-line was irregular and was found 42 cm from the incisors.      Diffuse moderate inflammation characterized by erosions, erythema,       friability and granularity was found in the entire examined stomach.      No other gross lesions were noted in the entire examined stomach.       Biopsies were taken with a cold forceps for histology and Helicobacter       pylori testing.      Normal mucosa was found  in the duodenal bulb, in the first portion of       the duodenum and in the second portion of the duodenum. Biopsies for       histology were taken with a cold forceps for evaluation of celiac       disease.      A prominent minor papilla was found.      A prominent major papilla was found.      ENDOSONOGRAPHIC FINDING: :      An irregular mass-like lesion was identified in the genu of the       pancreas. The lesion was hypoechoic. The mass measured 18 mm by 23 mm in       maximal cross-sectional diameter. The outer margins were irregular.       There was sonographic evidence suggesting abutment of the splenoportal       confluence. The remainder of the pancreas was examined and parenchymal       abnormalities are noted below. The endosonographic appearance of the       upstream pancreatic duct indicated duct dilation and this lesion was       right where subtle duct dilation occured (PDH = 1.1 mm -> 1.3 mm, PDN =       3.1 mm, PDB = 2.3 mm, PDT = 2.1 mm). Fine needle biopsy was performed of       that area. Color Doppler imaging was utilized prior to needle puncture       to confirm a lack of significant vascular structures within the needle       path. Six passes were made with the 22 gauge Acquire ultrasound core       biopsy needle using a transgastric approach. Visible cores of tissue       were  obtained. Preliminary cytologic examination and touch preps were       performed. Final cytology results are pending.      Pancreatic parenchymal abnormalities were noted in the entire pancreas.       These consisted of atrophy, lobularity with honeycombing and hyperechoic       strands.      An anechoic lesion suggestive of a cyst was identified in the pancreatic       body. 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 The lesion measured 9 mm by 8 mm in maximal       cross-sectional diameter. There was a single compartment without septae.        The outer wall of the lesion was thin. There was no associated mass.       There was no internal debris within the fluid-filled cavity.      There was no sign of significant endosonographic abnormality in the       common bile duct (2.5 mm -> 2.6 mm) and in the common hepatic duct (4.6       mm). No stones and ducts of normal caliber were identified.      Endosonographic imaging of the ampulla showed no extrinsic compression,       intramural (subepithelial) lesion, mass, varices or wall thickening.      Endosonographic imaging of the minor ampulla showed no wall thickening.      No malignant-appearing lymph nodes were visualized in the celiac region       (level 20), peripancreatic region and porta hepatis region.      The celiac region was visualized. Impression:               EGD Impression:                           - No gross lesions in esophagus. Z-line irregular,                            42 cm from the incisors.                           - Gastritis. No other gross lesions in the stomach.                            Biopsied for HP.                           - Normal mucosa was found in the duodenal bulb, in                            the first portion of the duodenum and in the second                            portion of the duodenum. Biopsied.                           - Prominent Minor and Major ampulla noted.  EUS Impression:                           - A lesion was identified in the genu of the                            pancreas. Cytology results are pending. However,                            the endosonographic appearance is suspicious for                            possible adenocarcinoma vs inflammatory changes in                            setting of what appears to be chronic pancreatitis.                            This was staged T2 N0 Mx by endosonographic                            criteria and the staging applies if malignancy is                             confirmed. Fine needle biopsy performed of the                            region where duct dilation occurs.                           - Pancreatic parenchymal abnormalities consisting                            of atrophy, lobularity with honeycombing and                            hyperechoic strands were noted in the entire                            pancreas. 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                           - A cystic lesion was seen in the pancreatic body.                            Tissue has not been obtained. 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                           - There was no sign of significant pathology in the                            common bile duct and in the common hepatic duct.                           - Major and Minor ampulla without evidence of                            wallthickening or mass.                           - No malignant-appearing lymph nodes were                            visualized in the celiac region (level 20),                            peripancreatic region and porta hepatis region. Recommendation:           - The patient will be observed post-procedure,                            until all discharge criteria are met.                           - Discharge patient to home.                           - Patient has a contact number available for                            emergencies. The signs and symptoms of potential                            delayed complications were discussed with the                            patient. Return to normal activities tomorrow.                            Written discharge instructions were provided to the                            patient.                           - Resume previous diet.                            - Start Protonix 40 mg daily.                           -  Await cytology results and await path results.                           - Observe patient's clinical course.                           - Obtain Fecal elastase.                           - Monitor for signs/symptoms of bleeding,                            perforation, and infection. If issues please call                            our number to get further assistance as needed.                           - The findings and recommendations were discussed                            with the patient.                           - The findings and recommendations were discussed                            with the patient's family. Procedure Code(s):        --- Professional ---                           [phone], Esophagogastroduodenoscopy, flexible,                            transoral; with transendoscopic ultrasound-guided                            intramural or transmural fine needle                            aspiration/biopsy(s), (includes endoscopic                            ultrasound examination limited to the esophagus,                            stomach or duodenum, and adjacent structures) Diagnosis Code(s):        --- Professional ---                           K22.8, Other specified diseases of esophagus                           K29.70, Gastritis, unspecified, without bleeding                           K31.89, Other diseases  of stomach and duodenum                           K86.89, Other specified diseases of pancreas                           K86.9, Disease of pancreas, unspecified                           K86.2, Cyst of pancreas                           I89.9, Noninfective disorder of lymphatic vessels                            and lymph nodes, unspecified                           R10.13, Epigastric pain CPT copyright 2019 American Medical Association. All rights reserved. The codes documented in this  report are preliminary and upon coder review may  be revised to meet current compliance requirements. Justice Britain, MD 01/15/2021 9:42:31 AM Number of Addenda: 0

## 2021-01-15 NOTE — Discharge Instructions (Signed)
YOU HAD AN ENDOSCOPIC PROCEDURE TODAY: Refer to the procedure report and other information in the discharge instructions given to you for any specific questions about what was found during the examination. If this information does not answer your questions, please call [place] office at [phone] to clarify.   YOU SHOULD EXPECT: Some feelings of bloating in the abdomen. Passage of more gas than usual. Walking can help get rid of the air that was put into your GI tract during the procedure and reduce the bloating. If you had a lower endoscopy (such as a colonoscopy or flexible sigmoidoscopy) you may notice spotting of blood in your stool or on the toilet paper. Some abdominal soreness may be present for a day or two, also.  DIET: Your first meal following the procedure should be a light meal and then it is ok to progress to your normal diet. A half-sandwich or bowl of soup is an example of a good first meal. Heavy or fried foods are harder to digest and may make you feel nauseous or bloated. Drink plenty of fluids but you should avoid alcoholic beverages for 24 hours. If you had a esophageal dilation, please see attached instructions for diet.    ACTIVITY: Your care partner should take you home directly after the procedure. You should plan to take it easy, moving slowly for the rest of the day. You can resume normal activity the day after the procedure however YOU SHOULD NOT DRIVE, use power tools, machinery or perform tasks that involve climbing or major physical exertion for 24 hours (because of the sedation medicines used during the test).   SYMPTOMS TO REPORT IMMEDIATELY: A gastroenterologist can be reached at any hour. Please call [phone]  for any of the following symptoms:   Following upper endoscopy (EGD, EUS, ERCP, esophageal dilation) Vomiting of blood or coffee ground material  New, significant abdominal pain  New, significant chest pain or pain under the shoulder blades  Painful or  persistently difficult swallowing  New shortness of breath  Black, tarry-looking or red, bloody stools  FOLLOW UP:  If any biopsies were taken you will be contacted by phone or by letter within the next 1-3 weeks. Call [phone]  if you have not heard about the biopsies in 3 weeks.  Please also call with any specific questions about appointments or follow up tests.  

## 2021-01-15 NOTE — H&P (Signed)
GASTROENTEROLOGY PROCEDURE H&P NOTE   Primary Care Physician: [name], [name] G, MD  HPI: Boby Eyer is a 75 y.o. male who presents for EGD/EUS for evaluation of abnormal MRI-imaging concern for pancreatic duct dilation.  Past Medical History:  Diagnosis Date  . Anemia   . Arthritis   . Cancer [facility])    prostate  . Chronic kidney disease    blood in urine   . Complication of anesthesia    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 it was hard to get the tube down his throat  . Constipation   . Coronary artery disease   . Depression   . Diabetes mellitus without complication ([place])   . Fatty liver   . Frequent headaches   . GERD (gastroesophageal reflux disease)   . History of chicken pox   . History of fainting spells of unknown cause   . Hyperlipidemia   . Hypertension   . Pneumonia   . PTSD (post-traumatic stress disorder)   . Sleep apnea    uses Cpap   Past Surgical History:  Procedure Laterality Date  . APPENDECTOMY    . CARDIAC SURGERY     Triple Bypass  . CHOLECYSTECTOMY  2010  . CORONARY ARTERY BYPASS GRAFT  2004  . TONSILLECTOMY  1958   Current Facility-Administered Medications  Medication Dose Route Frequency Provider Last Rate Last Admin  . lactated ringers infusion   Intravenous Continuous Mansouraty, Telford Nab., MD 20 mL/hr at 01/15/21 0736 New Bag at 01/15/21 0736   Allergies  Allergen Reactions  . Furosemide Other (See Comments)    unknown  . Lisinopril Other (See Comments)    Unknown  . Terazosin Other (See Comments)    unknown   Family History  Problem Relation Age of Onset  . Arthritis Mother   . Cancer Mother   . Heart disease Mother   . Hyperlipidemia Mother   . Hypertension Mother   . Heart attack Mother   . Cancer Sister   . Heart attack Brother   . Heart disease Brother   . Depression Daughter   . Arthritis Sister   . Cancer Brother   . Learning disabilities Brother   . Alcohol abuse Brother   . COPD Brother   . Heart attack Brother    . Heart disease Brother    Social History   Socioeconomic History  . Marital status: Married    Spouse name: Not on file  . Number of children: 2  . Years of education: Not on file  . Highest education level: Not on file  Occupational History  . Occupation: retired  Tobacco Use  . Smoking status: Former Smoker    Quit date: 12/14/1975    Years since quitting: 45.1  . Smokeless tobacco: Never Used  Vaping Use  . Vaping Use: Not on file  Substance and Sexual Activity  . Alcohol use: Not Currently  . Drug use: Never  . Sexual activity: Not Currently  Other Topics Concern  . Not on file  Social History Narrative  . Not on file   Social Determinants of Health   Financial Resource Strain: Not on file  Food Insecurity: Not on file  Transportation Needs: Not on file  Physical Activity: Not on file  Stress: Not on file  Social Connections: Not on file  Intimate Partner Violence: Not on file    Physical Exam: Vital signs in last 24 hours: Temp:  [97.7 F (36.5 C)] 97.7 F (36.5 C) (02/01 0700)  Pulse Rate:  [65] 65 (02/01 0700) Resp:  [17] 17 (02/01 0700) BP: (148)/(70) 148/70 (02/01 0700) SpO2:  [99 %] 99 % (02/01 0700) Weight:  [124.7 kg] 124.7 kg (02/01 0700)   GEN: NAD EYE: Sclerae anicteric ENT: MMM CV: Non-tachycardic GI: Soft, NT/ND NEURO:  Alert & Oriented x 3  Lab Results: No results for input(s): WBC, HGB, HCT, PLT in the last 72 hours. BMET No results for input(s): NA, K, CL, CO2, GLUCOSE, BUN, CREATININE, CALCIUM in the last 72 hours. LFT No results for input(s): PROT, ALBUMIN, AST, ALT, ALKPHOS, BILITOT, BILIDIR, IBILI in the last 72 hours. PT/INR No results for input(s): LABPROT, INR in the last 72 hours.   Impression / Plan: This is a 75 y.o.male who presents for EGD/EUS for evaluation of abnormal MRI-imaging concern for pancreatic duct dilation.  The risks of an EUS including intestinal perforation, bleeding, infection, aspiration, and  medication effects were discussed as was the possibility it may not give a definitive diagnosis if a biopsy is performed.  When a biopsy of the pancreas is done as part of the EUS, there is an additional risk of pancreatitis at the rate of about 1-2%.  It was explained that procedure related pancreatitis is typically mild, although it can be severe and even life threatening, which is why we do not perform random pancreatic biopsies and only biopsy a lesion/area we feel is concerning enough to warrant the risk.  The risks and benefits of endoscopic evaluation were discussed with the patient; these include but are not limited to the risk of perforation, infection, bleeding, missed lesions, lack of diagnosis, severe illness requiring hospitalization, as well as anesthesia and sedation related illnesses.  The patient is agreeable to proceed.    Justice Britain, MD [facility] Office # [phone]

## 2021-01-15 NOTE — Anesthesia Procedure Notes (Signed)
Procedure Name: MAC Date/Time: 01/15/2021 7:41 AM Performed by: Amadeo Garnet, CRNA Pre-anesthesia Checklist: Patient identified, Emergency Drugs available, Suction available and Patient being monitored Patient Re-evaluated:Patient Re-evaluated prior to induction Oxygen Delivery Method: Nasal cannula Preoxygenation: Pre-oxygenation with 100% oxygen Induction Type: IV induction Placement Confirmation: positive ETCO2 Dental Injury: Teeth and Oropharynx as per pre-operative assessment

## 2021-01-15 NOTE — Transfer of Care (Signed)
Immediate Anesthesia Transfer of Care Note  Patient: [name]  Procedure(s) Performed: ESOPHAGOGASTRODUODENOSCOPY (EGD) WITH PROPOFOL (N/A ) UPPER ENDOSCOPIC ULTRASOUND (EUS) RADIAL (N/A ) BIOPSY  Patient Location: Endoscopy Unit  Anesthesia Type:MAC  Level of Consciousness: drowsy  Airway & Oxygen Therapy: Patient Spontanous Breathing and Patient connected to face mask oxygen  Post-op Assessment: Report given to RN, Post -op Vital signs reviewed and stable and Patient moving all extremities  Post vital signs: Reviewed and stable  Last Vitals:  Vitals Value Taken Time  BP 116/61 01/15/21 0918  Temp    Pulse 51 01/15/21 0919  Resp 16 01/15/21 0919  SpO2 98 % 01/15/21 0919  Vitals shown include unvalidated device data.  Last Pain:  Vitals:   01/15/21 0700  TempSrc: Oral  PainSc: 0-No pain         Complications: No complications documented.

## 2021-01-15 NOTE — Anesthesia Postprocedure Evaluation (Signed)
Anesthesia Post Note  Patient: Aadhav Uhlig Pennypacker  Procedure(s) Performed: ESOPHAGOGASTRODUODENOSCOPY (EGD) WITH PROPOFOL (N/A ) UPPER ENDOSCOPIC ULTRASOUND (EUS) RADIAL (N/A ) BIOPSY     Patient location during evaluation: PACU Anesthesia Type: MAC Level of consciousness: awake and alert and oriented Pain management: pain level controlled Vital Signs Assessment: post-procedure vital signs reviewed and stable Respiratory status: spontaneous breathing, nonlabored ventilation and respiratory function stable Cardiovascular status: blood pressure returned to baseline Postop Assessment: no apparent nausea or vomiting Anesthetic complications: no   No complications documented.  Last Vitals:  Vitals:   01/15/21 0920 01/15/21 0929  BP: 116/61 119/61  Pulse: (!) 51 (!) 56  Resp: 16 12  Temp:    SpO2: 98% 97%    Last Pain:  Vitals:   01/15/21 0929  TempSrc:   PainSc: 0-No pain                 Brennan Bailey

## 2021-01-16 ENCOUNTER — Other Ambulatory Visit: Payer: Self-pay | Admitting: Physician Assistant

## 2021-01-16 ENCOUNTER — Encounter (HOSPITAL_COMMUNITY): Payer: Self-pay | Admitting: Gastroenterology

## 2021-01-16 LAB — SURGICAL PATHOLOGY

## 2021-01-19 ENCOUNTER — Encounter: Payer: Self-pay | Admitting: Gastroenterology

## 2021-01-21 LAB — CYTOLOGY - NON PAP

## 2021-01-22 ENCOUNTER — Telehealth: Payer: Self-pay | Admitting: Oncology

## 2021-01-22 ENCOUNTER — Other Ambulatory Visit: Payer: Self-pay

## 2021-01-22 DIAGNOSIS — Q453 Other congenital malformations of pancreas and pancreatic duct: Secondary | ICD-10-CM

## 2021-01-22 DIAGNOSIS — K869 Disease of pancreas, unspecified: Secondary | ICD-10-CM

## 2021-01-22 NOTE — Telephone Encounter (Signed)
Received a new pt referral from Dr. Rush Landmark for pancreatic lesion. [name] has been cld and scheduled to see Dr. Benay Spice on 2/17 at 2pm. Pt aware to arrive 20 minutes early.

## 2021-01-24 ENCOUNTER — Other Ambulatory Visit (INDEPENDENT_AMBULATORY_CARE_PROVIDER_SITE_OTHER): Payer: Medicare Other

## 2021-01-24 DIAGNOSIS — K869 Disease of pancreas, unspecified: Secondary | ICD-10-CM | POA: Diagnosis not present

## 2021-01-24 DIAGNOSIS — D3500 Benign neoplasm of unspecified adrenal gland: Secondary | ICD-10-CM | POA: Diagnosis not present

## 2021-01-24 DIAGNOSIS — Q453 Other congenital malformations of pancreas and pancreatic duct: Secondary | ICD-10-CM | POA: Diagnosis not present

## 2021-01-24 DIAGNOSIS — R3915 Urgency of urination: Secondary | ICD-10-CM | POA: Diagnosis not present

## 2021-01-24 DIAGNOSIS — Z8546 Personal history of malignant neoplasm of prostate: Secondary | ICD-10-CM | POA: Diagnosis not present

## 2021-01-24 LAB — CBC WITH DIFFERENTIAL/PLATELET
Basophils Absolute: 0 10*3/uL (ref 0.0–0.1)
Basophils Relative: 0.6 % (ref 0.0–3.0)
Eosinophils Absolute: 0.1 10*3/uL (ref 0.0–0.7)
Eosinophils Relative: 0.9 % (ref 0.0–5.0)
HCT: 34.8 % — ABNORMAL LOW (ref 39.0–52.0)
Hemoglobin: 12.2 g/dL — ABNORMAL LOW (ref 13.0–17.0)
Lymphocytes Relative: 18.7 % (ref 12.0–46.0)
Lymphs Abs: 1.1 10*3/uL (ref 0.7–4.0)
MCHC: 35 g/dL (ref 30.0–36.0)
MCV: 91.4 fl (ref 78.0–100.0)
Monocytes Absolute: 0.4 10*3/uL (ref 0.1–1.0)
Monocytes Relative: 6.6 % (ref 3.0–12.0)
Neutro Abs: 4.4 10*3/uL (ref 1.4–7.7)
Neutrophils Relative %: 73.2 % (ref 43.0–77.0)
Platelets: 196 10*3/uL (ref 150.0–400.0)
RBC: 3.81 Mil/uL — ABNORMAL LOW (ref 4.22–5.81)
RDW: 15.1 % (ref 11.5–15.5)
WBC: 6 10*3/uL (ref 4.0–10.5)

## 2021-01-24 LAB — COMPREHENSIVE METABOLIC PANEL
ALT: 15 U/L (ref 0–53)
AST: 19 U/L (ref 0–37)
Albumin: 4 g/dL (ref 3.5–5.2)
Alkaline Phosphatase: 81 U/L (ref 39–117)
BUN: 23 mg/dL (ref 6–23)
CO2: 29 mEq/L (ref 19–32)
Calcium: 9.6 mg/dL (ref 8.4–10.5)
Chloride: 100 mEq/L (ref 96–112)
Creatinine, Ser: 1.49 mg/dL (ref 0.40–1.50)
GFR: 45.39 mL/min — ABNORMAL LOW (ref >60.00)
Glucose, Bld: 257 mg/dL — ABNORMAL HIGH (ref 70–99)
Potassium: 3.6 mEq/L (ref 3.5–5.1)
Sodium: 136 mEq/L (ref 135–145)
Total Bilirubin: 0.8 mg/dL (ref 0.2–1.2)
Total Protein: 7.4 g/dL (ref 6.0–8.3)

## 2021-01-24 LAB — PROTIME-INR
INR: 1.1 ratio — ABNORMAL HIGH (ref 0.8–1.0)
Prothrombin Time: 12.7 s (ref 9.6–13.1)

## 2021-01-25 LAB — CANCER ANTIGEN 19-9: CA 19-9: 15 U/mL (ref <34)

## 2021-01-29 ENCOUNTER — Other Ambulatory Visit: Payer: Self-pay

## 2021-01-29 ENCOUNTER — Ambulatory Visit (HOSPITAL_COMMUNITY)
Admission: RE | Admit: 2021-01-29 | Discharge: 2021-01-29 | Disposition: A | Discharge status: Home / Self Care | Payer: Medicare Other | Source: Ambulatory Visit | Attending: Gastroenterology | Admitting: Gastroenterology

## 2021-01-29 DIAGNOSIS — Q453 Other congenital malformations of pancreas and pancreatic duct: Secondary | ICD-10-CM | POA: Diagnosis not present

## 2021-01-29 DIAGNOSIS — C61 Malignant neoplasm of prostate: Secondary | ICD-10-CM | POA: Diagnosis not present

## 2021-01-29 DIAGNOSIS — K869 Disease of pancreas, unspecified: Secondary | ICD-10-CM

## 2021-01-29 DIAGNOSIS — R748 Abnormal levels of other serum enzymes: Secondary | ICD-10-CM

## 2021-01-29 IMAGING — CT CT CHEST W/ CM
2 of 9 series · 13 of 46 positions shown, 15 images · IV contrast (OMNIPAQUE)
Comparison: No prior CT of the chest, abdomen or pelvis. Abdominal
MRI [DATE].

CLINICAL DATA: 74-year-old male with history of pancreatic ductal
dilatation noted on prior abdominal MRI. Follow-up evaluation to
exclude possible pancreatic neoplasm. Newly diagnosed prostate
cancer.

EXAM:
CT CHEST, ABDOMEN, AND PELVIS WITH CONTRAST
TECHNIQUE: Multidetector CT imaging of the chest, abdomen and pelvis was
performed following the standard protocol during bolus
administration of intravenous contrast.
CONTRAST:  100mL OMNIPAQUE IOHEXOL 300 MG/ML  SOLN

[Series 3: coronal arterial · coronal · arterial · 0.65mm/px · 3 of 110 slices shown]
[im 28/110  soft-tissue]
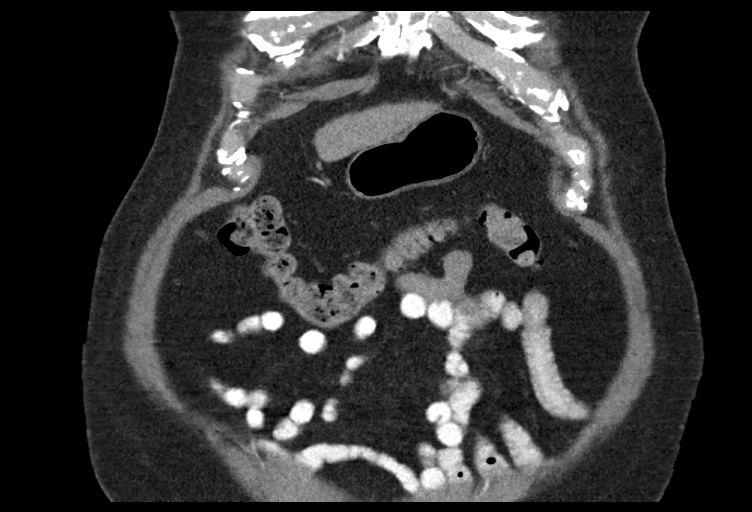
[im 55/110  soft-tissue]
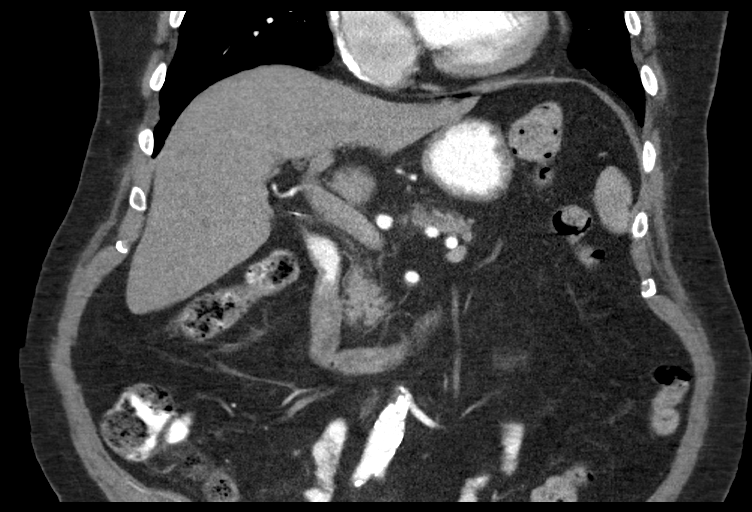
[im 82/110  soft-tissue]
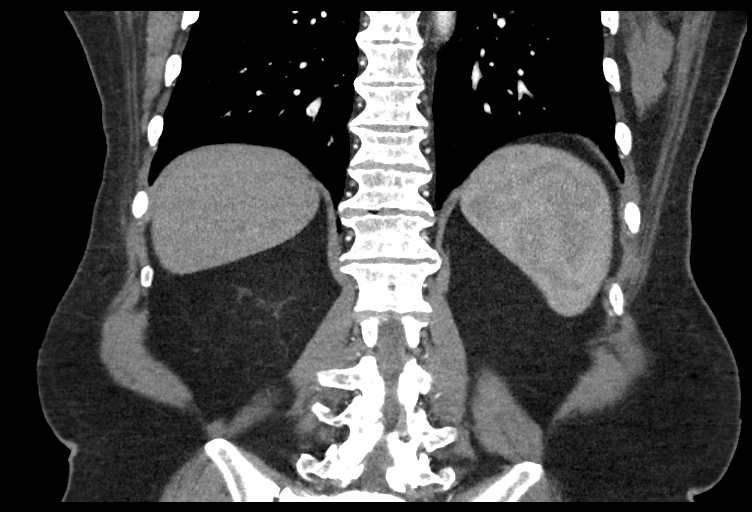

[Series 7: axial venous · axial · portal-venous · 0.95mm/px · z∈[-619,-55]mm · 10 of 230 slices shown, 12 images]
[im 21/230  soft-tissue]
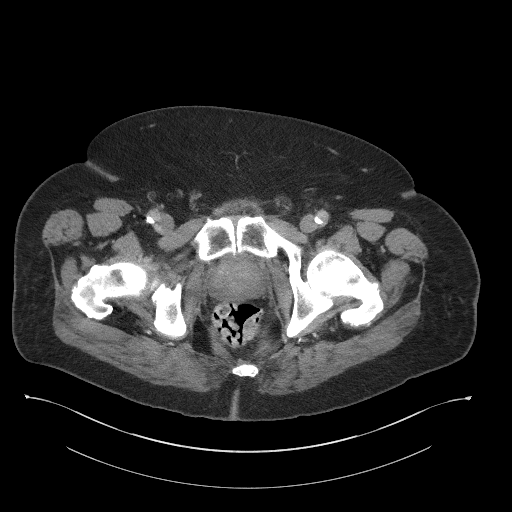
[im 21/230  bone]
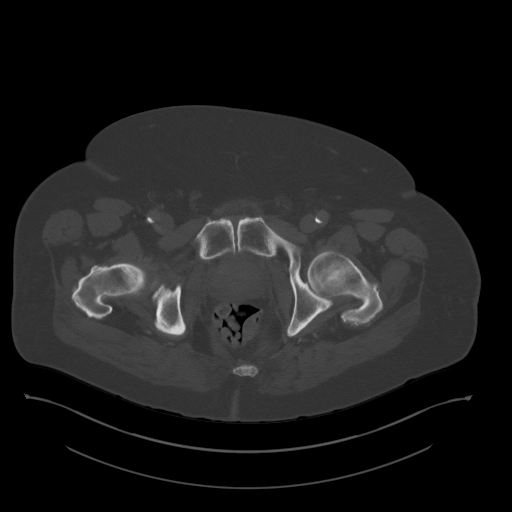
[im 42/230  soft-tissue]
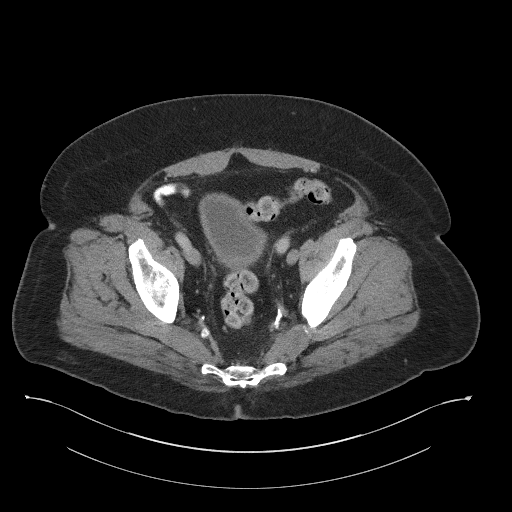
[im 63/230  soft-tissue]
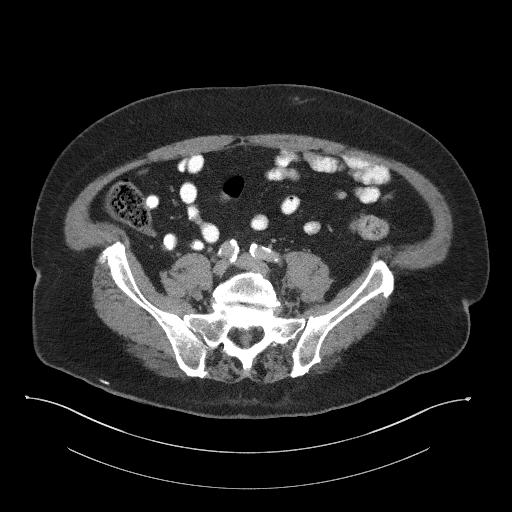
[im 84/230  soft-tissue]
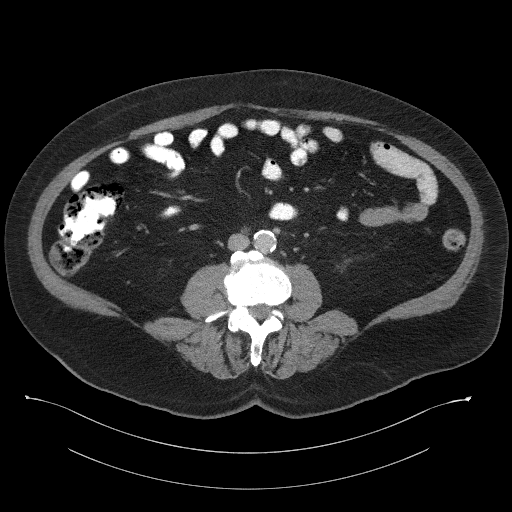
[im 105/230  soft-tissue]
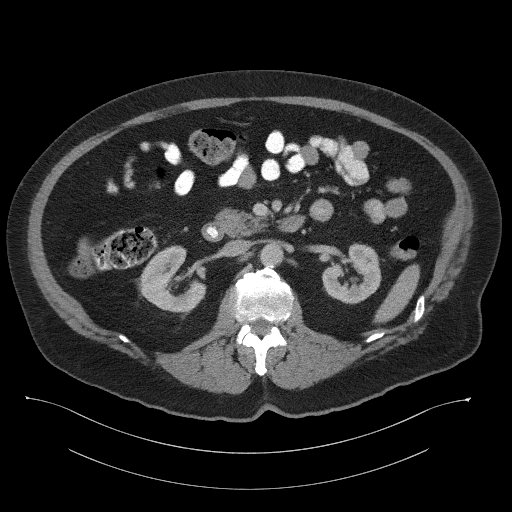
[im 125/230  soft-tissue]
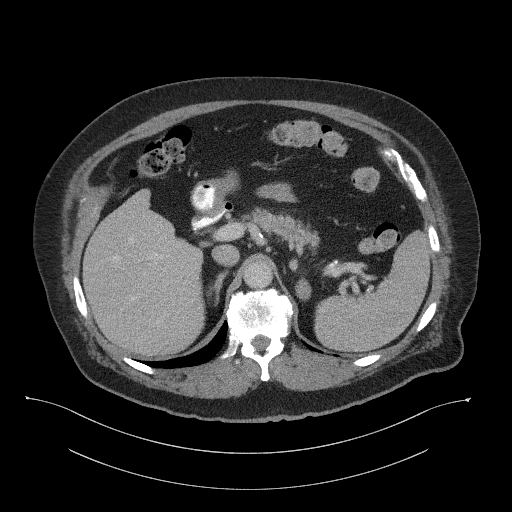
[im 146/230  soft-tissue]
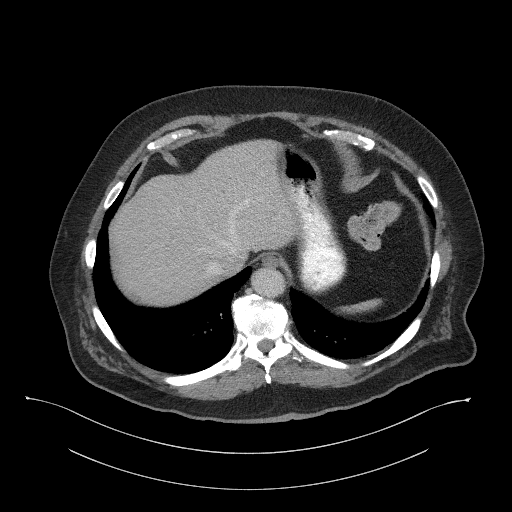
[im 167/230  soft-tissue]
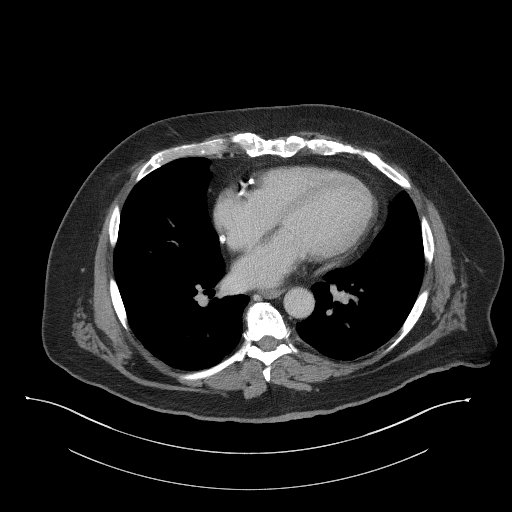
[im 188/230  soft-tissue]
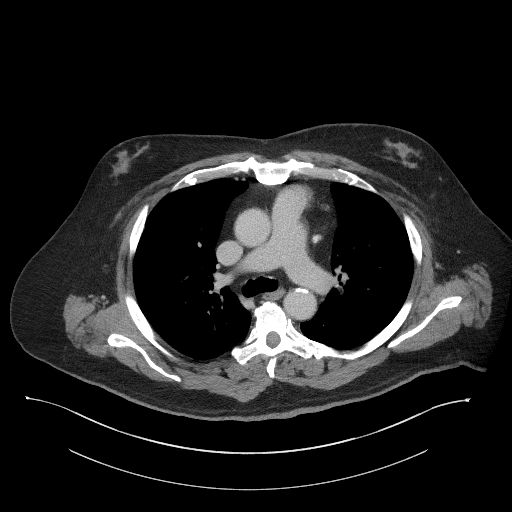
[im 188/230  bone]
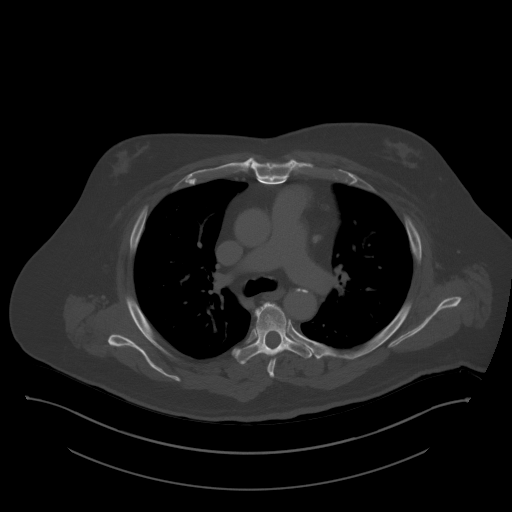
[im 209/230  soft-tissue]
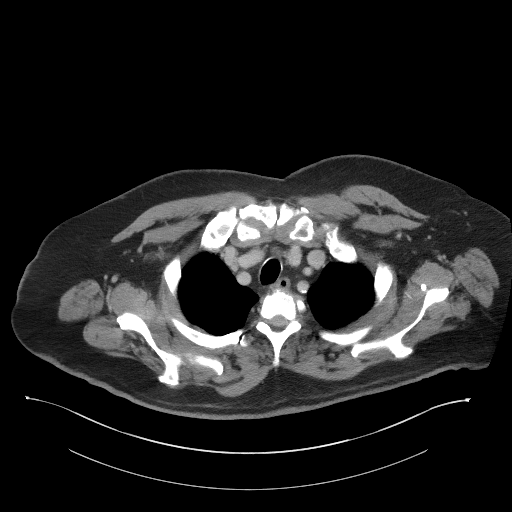

[13 of 46 positions shown; findings below may reference images not displayed]

FINDINGS: CT CHEST FINDINGS

Cardiovascular: Heart size is normal. There is no significant
pericardial fluid, thickening or pericardial calcification. There is
aortic atherosclerosis, as well as atherosclerosis of the great
vessels of the mediastinum and the coronary arteries, including
calcified atherosclerotic plaque in the left main, left anterior
descending, left circumflex and right coronary arteries. Status post
median sternotomy for CABG including [REDACTED] to the LAD.

Mediastinum/Nodes: No pathologically enlarged mediastinal or hilar
lymph nodes. Esophagus is unremarkable in appearance. No axillary
lymphadenopathy.

Lungs/Pleura: No suspicious appearing pulmonary nodules or masses
are noted. No acute consolidative airspace disease. No pleural
effusions. Mild scarring in the dependent portions of the lower
lobes of the lungs bilaterally.

Musculoskeletal: There are no aggressive appearing lytic or blastic
lesions noted in the visualized portions of the skeleton.

CT ABDOMEN PELVIS FINDINGS

Hepatobiliary: No suspicious cystic or solid hepatic lesions. No
intra or extrahepatic biliary ductal dilatation. Status post
cholecystectomy.

Pancreas: No pancreatic mass. No pancreatic ductal dilatation. No
pancreatic or peripancreatic fluid collections or inflammatory
changes.

Spleen: Unremarkable.

Adrenals/Urinary Tract: Well-defined 2.2 x 1.8 cm left adrenal
nodule, stable compared to the prior MRI examination, previously
characterized as a benign adenoma. Right adrenal gland is normal in
appearance. Tiny subcentimeter low-attenuation lesions in both
kidneys, too small to definitively characterize, but statistically
likely to represent tiny cysts. No aggressive appearing renal
lesions. No hydroureteronephrosis. Urinary bladder is normal in
appearance.

Stomach/Bowel: The appearance of the stomach is normal. There is no
pathologic dilatation of small bowel or colon. Normal appendix.

Vascular/Lymphatic: Aortic atherosclerosis, without evidence of
aneurysm or dissection in the abdominal or pelvic vasculature. No
lymphadenopathy noted in the abdomen or pelvis.

Reproductive: Mild median lobe hypertrophy in the prostate gland.
Prostate gland and seminal vesicles are otherwise unremarkable in
appearance.

Other: No significant volume of ascites.  No pneumoperitoneum.

Musculoskeletal: There are no aggressive appearing lytic or blastic
lesions noted in the visualized portions of the skeleton.
IMPRESSION: 1. No pancreatic mass. No pancreatic ductal dilatation confidently
identified on today's CT examination.
2. No definite signs of metastatic disease in the chest, abdomen or
pelvis.
3. Median lobe hypertrophy in the prostate gland. Prostate gland is
otherwise grossly unremarkable in appearance.
4. Aortic atherosclerosis, in addition to left main and 3 vessel
coronary artery disease. Status post median sternotomy for CABG
including [REDACTED] to the LAD.
5. Additional incidental findings, as above.

## 2021-01-29 MED ORDER — IOHEXOL 300 MG/ML  SOLN
100.0000 mL | Freq: Once | INTRAMUSCULAR | Status: AC | PRN
  Administered 2021-01-29: 100 mL via INTRAVENOUS

## 2021-01-31 ENCOUNTER — Inpatient Hospital Stay: Payer: Medicare Other | Attending: Oncology | Admitting: Oncology

## 2021-01-31 ENCOUNTER — Other Ambulatory Visit: Payer: Self-pay

## 2021-01-31 ENCOUNTER — Other Ambulatory Visit (INDEPENDENT_AMBULATORY_CARE_PROVIDER_SITE_OTHER): Payer: Medicare Other

## 2021-01-31 ENCOUNTER — Telehealth: Payer: Self-pay | Admitting: Oncology

## 2021-01-31 DIAGNOSIS — Z8546 Personal history of malignant neoplasm of prostate: Secondary | ICD-10-CM

## 2021-01-31 DIAGNOSIS — Q453 Other congenital malformations of pancreas and pancreatic duct: Secondary | ICD-10-CM | POA: Diagnosis not present

## 2021-01-31 DIAGNOSIS — C259 Malignant neoplasm of pancreas, unspecified: Secondary | ICD-10-CM | POA: Diagnosis not present

## 2021-01-31 DIAGNOSIS — C251 Malignant neoplasm of body of pancreas: Secondary | ICD-10-CM | POA: Insufficient documentation

## 2021-01-31 DIAGNOSIS — I2510 Atherosclerotic heart disease of native coronary artery without angina pectoris: Secondary | ICD-10-CM | POA: Diagnosis not present

## 2021-01-31 DIAGNOSIS — I10 Essential (primary) hypertension: Secondary | ICD-10-CM | POA: Diagnosis not present

## 2021-01-31 DIAGNOSIS — K869 Disease of pancreas, unspecified: Secondary | ICD-10-CM | POA: Diagnosis not present

## 2021-01-31 DIAGNOSIS — Z923 Personal history of irradiation: Secondary | ICD-10-CM | POA: Diagnosis not present

## 2021-01-31 DIAGNOSIS — Z809 Family history of malignant neoplasm, unspecified: Secondary | ICD-10-CM | POA: Diagnosis not present

## 2021-01-31 DIAGNOSIS — R748 Abnormal levels of other serum enzymes: Secondary | ICD-10-CM | POA: Diagnosis not present

## 2021-01-31 DIAGNOSIS — Z87891 Personal history of nicotine dependence: Secondary | ICD-10-CM

## 2021-01-31 DIAGNOSIS — E119 Type 2 diabetes mellitus without complications: Secondary | ICD-10-CM | POA: Diagnosis not present

## 2021-01-31 DIAGNOSIS — C250 Malignant neoplasm of head of pancreas: Secondary | ICD-10-CM

## 2021-01-31 LAB — VITAMIN B12: Vitamin B-12: 362 pg/mL (ref 211–911)

## 2021-01-31 LAB — IBC + FERRITIN
Ferritin: 48.6 ng/mL (ref 22.0–322.0)
Iron: 88 ug/dL (ref 42–165)
Saturation Ratios: 27.9 % (ref 20.0–50.0)
Transferrin: 225 mg/dL (ref 212.0–360.0)

## 2021-01-31 LAB — FOLATE: Folate: >23.6 ng/mL (ref >5.9)

## 2021-01-31 NOTE — Progress Notes (Signed)
[facility] New Patient Consult   Requesting MD: [name], [name] G, [address] [place],  [place] [address]   [name] 75 y.o.  [birth year]    Reason for Consult: Pancreas cancer   HPI: [name] saw his urology for an increase in urinary frequency.  He reports being diagnosed with a urinary tract infection.  An ultrasound revealed an abnormality in the "kidney ". He was referred for an MRI of the abdomen on 12/20/2020.  In the body the pancreas and abrupt loss of continuity of the pancreatic duct was noted with an upstream duct measurement of 3 mm and downstream 2 mm.  No discrete lesion was identified in this area.  A small pancreatic cyst was adjacent to the mid duct measuring 7 mm.  Enlarged the left adrenal gland measured 18 x 16 mm and was consistent with a benign adenoma.  No retroperitoneal or periportal lymphadenopathy. He was referred to Dr. Rush Landmark and was taken to an EUS on 01/15/2021.  Moderate inflammation was found in the stomach.  An irregular masslike lesion was found at the genu of the pancreas measuring 18 x 23 mm with irregular outer margins.  There was abutment of the splenoportal confluence.  There was upstream pancreas duct dilatation.  A fine-needle aspiration biopsy was performed.  There were changes of chronic pancreatitis.  A cyst was identified in the pancreas body communicating with the pancreas duct and measured 9 x 8 mm.  No abnormality of the common bile duct or common hepatic duct.  No stones were identified.  No malignant appearing lymph nodes.  The lesion was staged as a T2N0 by ultrasound.  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 The biopsies from the stomach was negative for H. pylori.  The pancreas biopsy revealed malignant cells.  The immunohistochemistry profile was consistent with a poorly differentiated carcinoma.  He underwent CTs of the chest, abdomen, and  pelvis on 01/29/2021.  No pancreas mass or duct dilatation was identified.  No evidence of metastatic disease.  Median lobe hypertrophy of the prostate.  He has been referred to surgical oncology.  [name] is here today with his wife.  His daughter was present by telephone for today's visit.  Past Medical History:  Diagnosis Date  . Anemia   . Arthritis   . Cancer [facility])  2013   prostate, treated with radiation at [facility]  . Chronic kidney disease    blood in urine   . Complication of anesthesia    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 it was hard to get the tube down his throat  . Constipation   . Coronary artery disease   . Depression   . Diabetes mellitus without complication ([place])   . Fatty liver   . Frequent headaches   . GERD (gastroesophageal reflux disease)   . History of chicken pox   . History of fainting spells of unknown cause   . Hyperlipidemia   . Hypertension   . Pneumonia   . PTSD (post-traumatic stress disorder)   . Sleep apnea    uses Cpap    Past Surgical History:  Procedure Laterality Date  . APPENDECTOMY    . BIOPSY  01/15/2021   Procedure: BIOPSY;  Surgeon: Rush Landmark Telford Nab., MD;  Location: [facility];  Service: Gastroenterology;;  . CARDIAC SURGERY     Triple Bypass  . CHOLECYSTECTOMY  2010  . CORONARY ARTERY BYPASS GRAFT  2004  .  ESOPHAGOGASTRODUODENOSCOPY (EGD) WITH PROPOFOL N/A 01/15/2021   Procedure: ESOPHAGOGASTRODUODENOSCOPY (EGD) WITH PROPOFOL;  Surgeon: Rush Landmark Telford Nab., MD;  Location: [facility];  Service: Gastroenterology;  Laterality: N/A;  . EUS N/A 01/15/2021   Procedure: UPPER ENDOSCOPIC ULTRASOUND (EUS) RADIAL;  Surgeon: Rush Landmark Telford Nab., MD;  Location: [facility];  Service: Gastroenterology;  Laterality: N/A;  . FINE NEEDLE ASPIRATION  01/15/2021   Procedure: FINE NEEDLE ASPIRATION (FNA) LINEAR;  Surgeon: Irving Copas., MD;  Location: [facility];  Service: Gastroenterology;;  . TONSILLECTOMY  1958    Medications:  Reviewed  Allergies:  Allergies  Allergen Reactions  . Furosemide Other (See Comments)    unknown  . Lisinopril Other (See Comments)    Unknown  . Terazosin Other (See Comments)    unknown    Family history: A sister had "cancer "   Social History:   He lives with his wife, son, and daughter in [place].  He is a retired Emergency planning/management officer.  He retired as a Librarian, academic in a Pharmacologist in 2008.  He quit smoking cigarettes in 1976.  He does not use alcohol.  He reports moderate alcohol use while in the [employer] many years ago.  No transfusion history.  No risk factor for HIV or hepatitis.  ROS:   Positives include: Nocturia x1, constipated-bowels move every 2 days  A complete ROS was otherwise negative.  Physical Exam:  Blood pressure 139/68, pulse 71, temperature 97.9 F (36.6 C), temperature source Tympanic, resp. rate 15, height 5\' 11"  (1.803 m), weight 281 lb 8 oz (127.7 kg), SpO2 96 %.  HEENT: Neck without mass Lungs: Clear bilaterally Cardiac: Regular rate and rhythm Abdomen: Nontender, no mass, no hepatosplenomegaly GU: Uncircumcised male, testes without mass Vascular: No leg edema Lymph nodes: No cervical, supraclavicular, axillary, or inguinal nodes Neurologic: Alert and oriented, motor exam appears intact in the upper and lower extremities bilaterally Skin: Yeast rash in the groin bilaterally Musculoskeletal: No spine tenderness   LAB:  CBC  Lab Results  Component Value Date   WBC 6.0 01/24/2021   HGB 12.2 (L) 01/24/2021   HCT 34.8 (L) 01/24/2021   MCV 91.4 01/24/2021   PLT 196.0 01/24/2021   NEUTROABS 4.4 01/24/2021        CMP  Lab Results  Component Value Date   NA 136 01/24/2021   K 3.6 01/24/2021   CL 100 01/24/2021   CO2 29 01/24/2021   GLUCOSE 257 (H) 01/24/2021   BUN 23 01/24/2021   CREATININE 1.49 01/24/2021   CALCIUM 9.6 01/24/2021   PROT 7.4 01/24/2021   ALBUMIN 4.0 01/24/2021   AST 19 01/24/2021   ALT 15 01/24/2021    ALKPHOS 81 01/24/2021   BILITOT 0.8 01/24/2021   GFRNONAA 50 (L) 10/12/2020   GFRAA 49 (L) 10/10/2020   CA 19-9 on 01/24/2021: 15   Imaging: As per HPI   Assessment/Plan:   1. 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  EUS two 122-18 x 23 mm mass in the genu of the pancreas, T2N0, abutment of the splenoportal confluence, changes of chronic pancreatitis, cystic lesion in the pancreas body consistent with a branch intraductal papillary mucinous neoplasm  Normal CA 19-9 2. Diabetes 3. Coronary artery disease 4. Prostate cancer 2013-[facility] with radiation at [facility]  5. Hypertension 6. Sleep apnea 7.   Coronary artery bypass surgery 2004  Disposition:   Mr. Debord has been diagnosed with pancreas cancer.  He most likely has a poorly differentiated adenocarcinoma.  We will review the pathology at the GI tumor conference to discuss the differential diagnosis further.  He appears to have disease localized to the pancreas.  He is asymptomatic.  The pancreas mass was discovered incidentally after he presented with a urinary tract infection.  I discussed the treatment of pancreas cancer with Mr. Hayward and his family.  He understands the only potentially curative treatment is surgery.  He has been referred to Dr. Barry Dienes.  We will present his case at the GI tumor conference next week.  If he is not a surgical candidate we will consider systemic chemotherapy and radiation options.  He will be referred to the genetics counselor if a diagnosis of adenocarcinoma was confirmed.  Betsy Coder, MD  01/31/2021, 2:13 PM

## 2021-01-31 NOTE — Telephone Encounter (Signed)
Scheduled appointment per 2/17 los. Spoke to patient's wife who is aware of appointment date and time.

## 2021-01-31 NOTE — Progress Notes (Signed)
Presents with wife, Glennis for new patient appointment. He lives with his wife, 77 year old son; and his daughter and 70 year old grandson while their home behind them is being remodeled. He loves having his grandson with him. 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 Used to enjoy playing golf. Will now sit on patio and watch others on the driving range. Activity is getting up in am and being on computer or in recliner watching TV, will get outside and walk some when weather is warm.

## 2021-01-31 NOTE — Progress Notes (Signed)
Met with patient and his wife Glennis at this initial medical oncology consult with Dr. Julieanne Manson.  I explained my role as nurse navigator and they were given my direct contact information.  They verbalized an understanding of plan to refer him for surgical consult with either Dr. Barry Dienes or Dr. Zenia Resides at [facility] and then be seen in follow up after that to discuss the plan.  I escorted them to scheduling to make follow up appointment.   I have messaged Cipriano Mile at [facility] with referral today.

## 2021-02-04 DIAGNOSIS — C259 Malignant neoplasm of pancreas, unspecified: Secondary | ICD-10-CM | POA: Diagnosis not present

## 2021-02-05 ENCOUNTER — Telehealth: Payer: Self-pay | Admitting: Family Medicine

## 2021-02-05 ENCOUNTER — Telehealth: Payer: Self-pay

## 2021-02-05 NOTE — Telephone Encounter (Signed)
I spoke with patient's wife. I placed a call to [facility] to clarify which vaccines pt needs. Pt had a pneumovax in 2020, so he is good there. Called to clarify if he actually needs Hib or if he just needed the annual flu vaccine. Pt will need the meningitis vaccine.

## 2021-02-05 NOTE — Telephone Encounter (Signed)
Pts spouse is calling in stating that the pt is needing the following vaccines meningococcal vaccine, HIB (haemophilus influenzae) and Pneumovax and the pt need to have these done before his surgery that they are trying to get scheduled within three weeks from today.

## 2021-02-05 NOTE — Telephone Encounter (Signed)
Spoke with [facility] & pt's wife. He is having a splenectomy so pt will need Hib & meningitis. Appt scheduled for Friday at 10:45am.

## 2021-02-05 NOTE — Telephone Encounter (Signed)
   [facility] Pre-operative Risk Assessment    HEARTCARE STAFF: - Please ensure there is not already an duplicate clearance open for this procedure. - Under Visit Info/Reason for Call, type in Other and utilize the format Clearance MM/DD/YY or Clearance TBD. Do not use dashes or single digits. - If request is for dental extraction, please clarify the # of teeth to be extracted.  Request for surgical clearance:  1. What type of surgery is being performed? Staging Laparoscopy, Open Distal Pancreatectomy and Splenectomy    2. When is this surgery scheduled? TBD   3. What type of clearance is required (medical clearance vs. Pharmacy clearance to hold med vs. Both)? Unknown- no medications on file, not able to view last office note.   4. Are there any medications that need to be held prior to surgery and how long? Unknown   5. Practice name and name of physician performing surgery? [facility]   6. What is the office phone number? [phone]   7.   What is the office fax number? [phone] (attn: kelsey phillips, cma)  8.   Anesthesia type (None, local, MAC, general) ? General    Ena Dawley 02/05/2021, 5:08 PM  _________________________________________________________________   (provider comments below)

## 2021-02-06 ENCOUNTER — Telehealth: Payer: Self-pay | Admitting: Cardiology

## 2021-02-06 ENCOUNTER — Ambulatory Visit: Payer: Self-pay | Admitting: Surgery

## 2021-02-06 ENCOUNTER — Other Ambulatory Visit: Payer: Self-pay

## 2021-02-06 NOTE — Telephone Encounter (Signed)
   Primary Cardiologist: No primary care provider on file.-Pt has never been seen by cardiology  Chart reviewed as part of pre-operative protocol coverage. Because of [name]'s past medical history and time since last visit, he will require a follow-up visit in order to better assess preoperative cardiovascular risk.  Pre-op covering staff: - Please schedule appointment and call patient to inform them. If patient already had an upcoming appointment within acceptable timeframe, please add "pre-op clearance" to the appointment notes so provider is aware. - Please contact requesting surgeon's office via preferred method (i.e, phone, fax) to inform them of need for appointment prior to surgery.  If applicable, this message will also be routed to pharmacy pool and/or primary cardiologist for input on holding anticoagulant/antiplatelet agent as requested below so that this information is available to the clearing provider at time of patient's appointment.   Kerin Ransom, PA-C  02/06/2021, 9:53 AM

## 2021-02-06 NOTE — Telephone Encounter (Signed)
   [facility] Pre-operative Risk Assessment    HEARTCARE STAFF: - Please ensure there is not already an duplicate clearance open for this procedure. - Under Visit Info/Reason for Call, type in Other and utilize the format Clearance MM/DD/YY or Clearance TBD. Do not use dashes or single digits. - If request is for dental extraction, please clarify the # of teeth to be extracted.  Request for surgical clearance:  1. What type of surgery is being performed? Staging Laparoscopy, Open Distal Pancreatectomy and Splenectomy    2. When is this surgery scheduled? TBD   3. What type of clearance is required (medical clearance vs. Pharmacy clearance to hold med vs. Both)? Unknown- no medications on file, not able to view last office note.   4. Are there any medications that need to be held prior to surgery and how long? Unknown   5. Practice name and name of physician performing surgery? [facility]   6. What is the office phone number? [phone]   7.   What is the office fax number? [phone] (attn: kelsey phillips, cma)  8.   Anesthesia type (None, local, MAC, general) ? General    Ena Dawley 02/05/2021, 5:08 PM  _________________________________________________________________   (provider comments below)   *COPY AND PASTE CLEARANCE*   Another chart was made for this patient where the clearance form was uploaded. Patient was considered new and therefore, needed a NP appt. After speaking with the patient's wife she stated he was a patient of Dr. [name] and this is when I realized there was an error and a duplicate chart for this patient was created. Spoke with Jonni Sanger. And she has closed the phone note in the chart that was created in error. I have copy and pasted the clearance from that chart into his actual chart. Will send to preop as there should be a different approach to this clearance. Patient is already scheduled  to see Dr. [name] on  02/27/21. He saw Kerin Ransom on 10/17/20.

## 2021-02-06 NOTE — Telephone Encounter (Signed)
I will forward clearance notes to MD for upcoming appt. Will send FYI to requesting pt has appt

## 2021-02-06 NOTE — Telephone Encounter (Signed)
Per pre op provider today [name], [name]) pt has never been seen by our office. Pt will need a NEW PT APPT . I will send a message to our scheduling team to please reach out to the pt with a NEW PT APPT. I will also send message to our Chart Prep Team to prep chart for appt. Will send FYI to requesting office the pt is going to need a NEW PT APPT.

## 2021-02-06 NOTE — Telephone Encounter (Signed)
So in review of the pt's chart for pre op clearance and an appt for pre op it has been discovered that there are 2 charts for this pt. In speaking with [employer] Team Lead in our scheduling dept that a new chart was created in error. Per Denny Peon the best thing to do is continue the correct chart which is [id number] # [id number]  Denny Peon said she will copy and paste notes over the correct chart for the clearance request and other notes in regards to pre op clearance.

## 2021-02-06 NOTE — Telephone Encounter (Signed)
I called and spoke with the patient and his wife. Apparently it has been recommended that the patient have chemotherapy prior to surgery. This would delay surgery a couple months.  The patient has an office visit with Dr [name] 3/16 and he can discuss pre op clearance further then.  Kerin Ransom PA-C 02/06/2021 4:41 PM

## 2021-02-06 NOTE — Progress Notes (Signed)
The proposed treatment discussed in conference is for discussion purposes only and is not a binding recommendation.  The patients have not been physically examined, or presented with their treatment options.  Therefore, final treatment plans cannot be decided.   

## 2021-02-08 ENCOUNTER — Other Ambulatory Visit: Payer: Self-pay

## 2021-02-08 ENCOUNTER — Ambulatory Visit (INDEPENDENT_AMBULATORY_CARE_PROVIDER_SITE_OTHER): Payer: Medicare Other

## 2021-02-08 DIAGNOSIS — Z23 Encounter for immunization: Secondary | ICD-10-CM | POA: Diagnosis not present

## 2021-02-08 DIAGNOSIS — D739 Disease of spleen, unspecified: Secondary | ICD-10-CM | POA: Diagnosis not present

## 2021-02-14 ENCOUNTER — Encounter: Payer: Self-pay | Admitting: Nurse Practitioner

## 2021-02-14 ENCOUNTER — Inpatient Hospital Stay: Payer: Medicare Other | Attending: Nurse Practitioner | Admitting: Nurse Practitioner

## 2021-02-14 ENCOUNTER — Other Ambulatory Visit: Payer: Self-pay

## 2021-02-14 VITALS — BP 115/62 | HR 88 | Temp 97.9°F | Resp 18 | Ht 71.0 in | Wt 280.7 lb

## 2021-02-14 DIAGNOSIS — G629 Polyneuropathy, unspecified: Secondary | ICD-10-CM | POA: Insufficient documentation

## 2021-02-14 DIAGNOSIS — C259 Malignant neoplasm of pancreas, unspecified: Secondary | ICD-10-CM | POA: Insufficient documentation

## 2021-02-14 DIAGNOSIS — Z8546 Personal history of malignant neoplasm of prostate: Secondary | ICD-10-CM | POA: Diagnosis not present

## 2021-02-14 DIAGNOSIS — I10 Essential (primary) hypertension: Secondary | ICD-10-CM | POA: Insufficient documentation

## 2021-02-14 DIAGNOSIS — Z5111 Encounter for antineoplastic chemotherapy: Secondary | ICD-10-CM | POA: Diagnosis not present

## 2021-02-14 DIAGNOSIS — E119 Type 2 diabetes mellitus without complications: Secondary | ICD-10-CM | POA: Insufficient documentation

## 2021-02-14 DIAGNOSIS — C250 Malignant neoplasm of head of pancreas: Secondary | ICD-10-CM | POA: Diagnosis not present

## 2021-02-14 MED ORDER — LIDOCAINE-PRILOCAINE 2.5-2.5 % EX CREA: TOPICAL_CREAM | CUTANEOUS | 3 refills | Status: DC

## 2021-02-14 MED ORDER — PROCHLORPERAZINE MALEATE 10 MG PO TABS: 10.0000 mg | ORAL_TABLET | Freq: Four times a day (QID) | ORAL | 2 refills | Status: DC | PRN

## 2021-02-14 NOTE — Progress Notes (Addendum)
[place] OFFICE PROGRESS NOTE   Diagnosis: Pancreas cancer  INTERVAL HISTORY:   [name] returns as scheduled.  Overall appetite is good.  No nausea or vomiting.  Some constipation.  He denies pain.  He has baseline neuropathy symptoms characterized by numbness, tingling, burning sensation in the feet.  He estimates blood sugars range from 140-160.  Objective:  Vital signs in last 24 hours:  Blood pressure 115/62, pulse 88, temperature 97.9 F (36.6 C), temperature source Tympanic, resp. rate 18, height 5\' 11"  (1.803 m), weight 280 lb 11.2 oz (127.3 kg), SpO2 99 %.    HEENT: No thrush or ulcers. Resp: Lungs clear bilaterally. Cardio: Regular rate and rhythm. GI: Abdomen soft and nontender.  No hepatomegaly. Vascular: No leg edema.  Lab Results:  Lab Results  Component Value Date   WBC 6.0 01/24/2021   HGB 12.2 (L) 01/24/2021   HCT 34.8 (L) 01/24/2021   MCV 91.4 01/24/2021   PLT 196.0 01/24/2021   NEUTROABS 4.4 01/24/2021    Imaging:  No results found.  Medications: I have reviewed the patient's current medications.  Assessment/Plan: 1. 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 ? 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 Normal CA 19-9 01/24/2021 ? CTs 01/29/2021-no pancreatic mass.  No pancreatic ductal dilatation identified.  No definite signs of metastatic disease in the chest, abdomen or pelvis. 2. Diabetes 3. Coronary artery disease 4. Prostate cancer 2013-treated  with radiation at [facility] 5. Hypertension 6. Sleep apnea 7. Coronary artery bypass surgery 2004  Disposition: [name] has pancreas cancer.  Dr. Zenia [name] feels he has resectable disease.  Recommendation from tumor board is for neoadjuvant gemcitabine/Abraxane followed by surgery.  Dr. Benay [name] reviewed this information with [name] and his wife at today's visit.  We discussed potential side effects associated with chemotherapy including bone marrow toxicity, nausea, hair loss, allergic reaction.  We discussed the potential for fever, rash, pneumonitis associated with Gemcitabine.  We discussed the neuropathy associated with Abraxane.  He has pre-existing neuropathy and understands the potential for this worsening.  He would like to proceed with Gemcitabine/Abraxane which will be given on a 2-week schedule.  He will attend a chemotherapy education class.  He is scheduled for Port-A-Cath placement next week.  Prescriptions were sent to his pharmacy for Compazine and EMLA cream.  Referrals made to genetics and nutrition.  He will return for cycle 1 gemcitabine/Abraxane on 02/28/2021.  We will see him in follow-up prior to cycle 2 on 03/14/2021.  He will contact the office in the interim with any problems.  Patient seen with Dr. Benay [name].    Ned Card ANP/GNP-BC   02/14/2021  2:35 PM  This was a shared visit with Ned Card.  [name] has been diagnosed with resectable versus borderline resectable pancreas cancer.  His case was presented at the GI tumor conference.  Neoadjuvant therapy was recommended.  He does not appear to be a candidate for FOLFIRINOX.  I recommend gemcitabine/Abraxane.  We reviewed potential toxicities associated with this chemotherapy regimen including the chance of developing progressive neuropathy.  He agrees  to proceed.  The plan is to begin gemcitabine/Abraxane on 02/28/2021.  A chemotherapy plan was entered today.  I was present for greater than 50% of today's  visit.  I performed medical decision making.  Julieanne Manson, MD

## 2021-02-15 NOTE — Progress Notes (Signed)
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 AJCC N Category: Staged < 8th Ed. Resectability Status: Borderline Resectable AJCC M Category: Staged < 8th Ed. AJCC 8 Stage Grouping: Staged < 8th Ed. ECOG Performance Status: 0 BRCA1/2 Mutation Status: Awaiting Test Results PALB2 Mutation Status: Awaiting Test Results Intent of Therapy: Curative Intent, Discussed with Patient

## 2021-02-18 ENCOUNTER — Other Ambulatory Visit (HOSPITAL_COMMUNITY)
Admission: RE | Admit: 2021-02-18 | Discharge: 2021-02-18 | Disposition: A | Discharge status: Home / Self Care | Payer: Medicare Other | Source: Ambulatory Visit | Attending: Surgery | Admitting: Surgery

## 2021-02-18 DIAGNOSIS — Z20822 Contact with and (suspected) exposure to covid-19: Secondary | ICD-10-CM | POA: Insufficient documentation

## 2021-02-18 DIAGNOSIS — Z01812 Encounter for preprocedural laboratory examination: Secondary | ICD-10-CM | POA: Insufficient documentation

## 2021-02-18 LAB — SARS CORONAVIRUS 2 (TAT 6-24 HRS): SARS Coronavirus 2: NEGATIVE

## 2021-02-20 ENCOUNTER — Encounter (HOSPITAL_COMMUNITY): Payer: Self-pay | Admitting: Vascular Surgery

## 2021-02-20 ENCOUNTER — Encounter (HOSPITAL_COMMUNITY): Payer: Self-pay | Admitting: Surgery

## 2021-02-20 MED ORDER — DEXTROSE 5 % IV SOLN
3.0000 g | INTRAVENOUS | Status: DC
  Filled 2021-02-20: qty 3000

## 2021-02-20 NOTE — Progress Notes (Signed)
Anesthesia Chart Review: SAME DAY WORK-UP   Case: [id number] Date/Time: 02/21/21 1245   Procedure: INSERTION PORT-A-CATH (N/A ) - ROOM 2 STARTING AT 01:00PM FOR 60 MIN   Anesthesia type: General   Pre-op diagnosis: PANCREATIC CANCER   Location: [place] OR ROOM 02 / [place] OR   Surgeons: Dwan Bolt, MD      DISCUSSION: Patient is a 75 year old male scheduled for the above procedure. Pancreatic FNA cytology from 01/15/21 EGD/EUS showed malignant cells  History includes former smoker (quit 12/14/75), CAD (s/p CABG 2004 [facility]; 06/26/17 [facility]: patent grafts LIMA-LAD, SVG-RCA, SVG-RI with 40-50% stenosis), HTN, DM2, HLD, fainting spells, PTSD, agent orange exposure, fatty liver, pancreatic cancer, OSA (uses CPAP), GERD, anemia, prostate cancer (s/p radiation ~ 2013), CKD/microscopic hematuria, frequent headaches, DIFFICULT AIRWAY (reported "during CABG was told it was hard to get the tube down his throat").   02/18/2021 presurgical COVID-19 test negative.  Anesthesia team to evaluate on the day of surgery.   VS:   Wt Readings from Last 3 Encounters:  02/14/21 127.3 kg  01/31/21 127.7 kg  01/15/21 124.7 kg   BP Readings from Last 3 Encounters:  02/14/21 115/62  01/31/21 139/68  01/15/21 138/65   Pulse Readings from Last 3 Encounters:  02/14/21 88  01/31/21 71  01/15/21 (!) 56    PROVIDERS: [name], [name] G, MD is PCP  [name], Peter, MD is cardiologist. Last visit 10/17/20 with Kerin Ransom, PA-C. Ladell Pier, MD is HEM-ONC Kathie Rhodes, MD is urologist   LABS: Currently last lab results include: Lab Results  Component Value Date   WBC 6.0 01/24/2021   HGB 12.2 (L) 01/24/2021   HCT 34.8 (L) 01/24/2021   PLT 196.0 01/24/2021   GLUCOSE 257 (H) 01/24/2021   ALT 15 01/24/2021   AST 19 01/24/2021   NA 136 01/24/2021   K 3.6 01/24/2021   CL 100 01/24/2021   CREATININE 1.49 01/24/2021   BUN 23 01/24/2021   CO2 29 01/24/2021   INR 1.1 (H) 01/24/2021   HGBA1C 6.3 (H) 10/10/2020      IMAGES: CT Chest/abd/pelvis 01/29/21: IMPRESSION: 1. No pancreatic mass. No pancreatic ductal dilatation confidently identified on today's CT examination. 2. No definite signs of metastatic disease in the chest, abdomen or pelvis. 3. Median lobe hypertrophy in the prostate gland. Prostate gland is otherwise grossly unremarkable in appearance. 4. Aortic atherosclerosis, in addition to left main and 3 vessel coronary artery disease. Status post median sternotomy for CABG including LIMA to the LAD. 5. Additional incidental findings, as above. [see full report]  MRI Abdomen 12/20/20: IMPRESSION: 1. Loss of continuity of the pancreatic duct in the mid pancreatic body with mild upstream duct dilatation. No discrete lesion identified however cannot exclude an obstructing lesion. There is a small cystic lesion adjacent the duct dilatation which could indicate a intraductal papillary mucinous tumor. Cannot exclude a more aggressive obstructive neoplasm (adenocarcinoma). Recommend GI consultation, correlation with tumor markers (CA 19 9) and consider endoscopic ultrasound for further evaluation. At minimum, recommend follow-up MRI pancreatic protocol without contrast in 1-3 months. 2. 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 3. Postcholecystectomy.   EKG: 10/17/20: Normal sinus rhythm Cannot rule out inferior infarct, age undetermined Cannot rule out anterior infarct, age undetermined   CV: Cardiac cath 06/26/17 [facility]): Conclusions:   1. Three-vessel coronary artery disease with patent LIMA graft to the  LAD and vein graft to the RCA, 40-50% occlusion in the proximal segment of  the vein  graft to the ramus.   2. Overall normal left ventricular systolic function but with minimal  inferior hypokinesis, ejection fraction 60%.   3. Overall normal left ventricular diastolic function, LVEDP 18 mmHg.  Plan:   Continue with risk factormodification.     Past Medical History:  Diagnosis Date  . Anemia   . Arthritis   . Cancer [name])    prostate  . Chronic kidney disease    blood in urine   . Constipation   . Coronary artery disease   . Depression   . Diabetes mellitus without complication ([place])   . Difficult intubation    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 it was hard to get the tube down his throat  . Fatty liver   . Frequent headaches   . GERD (gastroesophageal reflux disease)   . History of chicken pox   . History of fainting spells of unknown cause   . Hyperlipidemia   . Hypertension   . Myocardial infarction ([place])   . Pneumonia   . PTSD (post-traumatic stress disorder)   . Sleep apnea    uses Cpap    Past Surgical History:  Procedure Laterality Date  . APPENDECTOMY    . BIOPSY  01/15/2021   Procedure: BIOPSY;  Surgeon: Rush Landmark Telford Nab., MD;  Location: [place];  Service: Gastroenterology;;  . CARDIAC SURGERY     Triple Bypass  . CHOLECYSTECTOMY  2010  . CORONARY ARTERY BYPASS GRAFT  2004  . ESOPHAGOGASTRODUODENOSCOPY (EGD) WITH PROPOFOL N/A 01/15/2021   Procedure: ESOPHAGOGASTRODUODENOSCOPY (EGD) WITH PROPOFOL;  Surgeon: Rush Landmark Telford Nab., MD;  Location: [place];  Service: Gastroenterology;  Laterality: N/A;  . EUS N/A 01/15/2021   Procedure: UPPER ENDOSCOPIC ULTRASOUND (EUS) RADIAL;  Surgeon: Rush Landmark Telford Nab., MD;  Location: [place];  Service: Gastroenterology;  Laterality: N/A;  . FINE NEEDLE ASPIRATION  01/15/2021   Procedure: FINE NEEDLE ASPIRATION (FNA) LINEAR;  Surgeon: Irving Copas., MD;  Location: [place];  Service: Gastroenterology;;  . TONSILLECTOMY  1958    MEDICATIONS: . [START ON 02/21/2021] ceFAZolin (ANCEF) 3 g in dextrose 5 % 50 mL IVPB   . aspirin 81 MG chewable tablet  . atorvastatin (LIPITOR) 80 MG tablet  . calcium carbonate (OS-CAL) 600 MG TABS tablet  . cycloSPORINE (RESTASIS) 0.05 % ophthalmic emulsion  . FLUoxetine (PROZAC) 20 MG capsule  .  hydrochlorothiazide (HYDRODIURIL) 25 MG tablet  . insulin glargine (LANTUS) 100 UNIT/ML injection  . isosorbide mononitrate (IMDUR) 30 MG 24 hr tablet  . losartan (COZAAR) 100 MG tablet  . metoprolol tartrate (LOPRESSOR) 25 MG tablet  . Multiple Vitamin (MULTI-VITAMINS) TABS  . nitroGLYCERIN (NITROSTAT) 0.4 MG SL tablet  . pantoprazole (PROTONIX) 40 MG tablet  . PREBIOTIC PRODUCT PO  . tamsulosin (FLOMAX) 0.4 MG CAPS capsule  . traZODone (DESYREL) 100 MG tablet  . Vibegron (GEMTESA) 75 MG TABS  . fluticasone (FLONASE) 50 MCG/ACT nasal spray  . lidocaine-prilocaine (EMLA) cream  . Probiotic CHEW  . prochlorperazine (COMPAZINE) 10 MG tablet    Myra Gianotti, PA-C Surgical Short Stay/Anesthesiology [facility] Phone [phone] [facility] Phone [phone] 02/20/2021 4:30 PM

## 2021-02-20 NOTE — Progress Notes (Addendum)
PCP: [name], MD Cardiologist: Peter [name], MD Oncologist: Betsy Coder, MD  EKG: 10/17/20 CXR: 10/12/20 ECHO:  Stress Test: 2015 Cardiac Cath: 06/26/17  OSA/CPAP: Yes, wears nightly  ASA: Continue Blood thinners: No  Covid test 02/18/21 negative  Anesthesia Review:  Yes, difficult airway and CAD, CABG  Patient denies shortness of breath, fever, cough, and chest pain at PAT appointment.  Patient verbalized understanding of instructions provided today at the PAT appointment.  Patient asked to review instructions at home and day of surgery.

## 2021-02-20 NOTE — Anesthesia Preprocedure Evaluation (Signed)
Anesthesia Evaluation    Airway        Dental   Pulmonary former smoker,           Cardiovascular hypertension,      Neuro/Psych    GI/Hepatic   Endo/Other  diabetes  Renal/GU      Musculoskeletal   Abdominal   Peds  Hematology   Anesthesia Other Findings   Reproductive/Obstetrics                             Anesthesia Physical Anesthesia Plan  ASA:   Anesthesia Plan:    Post-op Pain Management:    Induction:   PONV Risk Score and Plan:   Airway Management Planned:   Additional Equipment:   Intra-op Plan:   Post-operative Plan:   Informed Consent:   Plan Discussed with:   Anesthesia Plan Comments: (PAT note written 02/20/2021 by Myra Gianotti, PA-C. )        Anesthesia Quick Evaluation

## 2021-02-21 ENCOUNTER — Ambulatory Visit (HOSPITAL_COMMUNITY): Payer: Medicare Other | Admitting: Certified Registered"

## 2021-02-21 ENCOUNTER — Ambulatory Visit (HOSPITAL_COMMUNITY): Payer: Medicare Other

## 2021-02-21 ENCOUNTER — Encounter (HOSPITAL_COMMUNITY): Payer: Self-pay | Admitting: Surgery

## 2021-02-21 ENCOUNTER — Inpatient Hospital Stay: Payer: Medicare Other

## 2021-02-21 ENCOUNTER — Encounter (HOSPITAL_COMMUNITY)
Admission: RE | Disposition: A | Discharge status: Home / Self Care | Payer: Self-pay | Source: Home / Self Care | Attending: Surgery

## 2021-02-21 ENCOUNTER — Ambulatory Visit (HOSPITAL_COMMUNITY)
Admission: RE | Admit: 2021-02-21 | Discharge: 2021-02-21 | Disposition: A | Discharge status: Home / Self Care | Payer: Medicare Other | Attending: Surgery | Admitting: Surgery

## 2021-02-21 ENCOUNTER — Ambulatory Visit (HOSPITAL_COMMUNITY): Admission: RE | Admit: 2021-02-21 | Payer: Medicare Other | Source: Ambulatory Visit | Admitting: Surgery

## 2021-02-21 ENCOUNTER — Other Ambulatory Visit: Payer: Self-pay

## 2021-02-21 DIAGNOSIS — Z7982 Long term (current) use of aspirin: Secondary | ICD-10-CM | POA: Insufficient documentation

## 2021-02-21 DIAGNOSIS — Z794 Long term (current) use of insulin: Secondary | ICD-10-CM | POA: Diagnosis not present

## 2021-02-21 DIAGNOSIS — E1122 Type 2 diabetes mellitus with diabetic chronic kidney disease: Secondary | ICD-10-CM | POA: Diagnosis not present

## 2021-02-21 DIAGNOSIS — Z79899 Other long term (current) drug therapy: Secondary | ICD-10-CM | POA: Insufficient documentation

## 2021-02-21 DIAGNOSIS — Z87891 Personal history of nicotine dependence: Secondary | ICD-10-CM | POA: Insufficient documentation

## 2021-02-21 DIAGNOSIS — C259 Malignant neoplasm of pancreas, unspecified: Secondary | ICD-10-CM | POA: Diagnosis not present

## 2021-02-21 DIAGNOSIS — Z888 Allergy status to other drugs, medicaments and biological substances status: Secondary | ICD-10-CM | POA: Diagnosis not present

## 2021-02-21 DIAGNOSIS — Z95828 Presence of other vascular implants and grafts: Secondary | ICD-10-CM

## 2021-02-21 DIAGNOSIS — I1310 Hypertensive heart and chronic kidney disease without heart failure, with stage 1 through stage 4 chronic kidney disease, or unspecified chronic kidney disease: Secondary | ICD-10-CM | POA: Diagnosis not present

## 2021-02-21 DIAGNOSIS — Z452 Encounter for adjustment and management of vascular access device: Secondary | ICD-10-CM | POA: Diagnosis not present

## 2021-02-21 DIAGNOSIS — N183 Chronic kidney disease, stage 3 unspecified: Secondary | ICD-10-CM | POA: Diagnosis not present

## 2021-02-21 DIAGNOSIS — Z419 Encounter for procedure for purposes other than remedying health state, unspecified: Secondary | ICD-10-CM

## 2021-02-21 HISTORY — DX: Acute myocardial infarction, unspecified: I21.9

## 2021-02-21 HISTORY — DX: Failed or difficult intubation, initial encounter: T88.4XXA

## 2021-02-21 HISTORY — PX: PORTACATH PLACEMENT: SHX2246

## 2021-02-21 LAB — COMPREHENSIVE METABOLIC PANEL
ALT: 18 U/L (ref 0–44)
AST: 26 U/L (ref 15–41)
Albumin: 3.5 g/dL (ref 3.5–5.0)
Alkaline Phosphatase: 57 U/L (ref 38–126)
Anion gap: 9 (ref 5–15)
BUN: 18 mg/dL (ref 8–23)
CO2: 26 mmol/L (ref 22–32)
Calcium: 9.4 mg/dL (ref 8.9–10.3)
Chloride: 103 mmol/L (ref 98–111)
Creatinine, Ser: 1.46 mg/dL — ABNORMAL HIGH (ref 0.61–1.24)
GFR, Estimated: 50 mL/min — ABNORMAL LOW (ref >60)
Glucose, Bld: 70 mg/dL (ref 70–99)
Potassium: 3.1 mmol/L — ABNORMAL LOW (ref 3.5–5.1)
Sodium: 138 mmol/L (ref 135–145)
Total Bilirubin: 1.2 mg/dL (ref 0.3–1.2)
Total Protein: 6.5 g/dL (ref 6.5–8.1)

## 2021-02-21 LAB — CBC
HCT: 32.5 % — ABNORMAL LOW (ref 39.0–52.0)
Hemoglobin: 11.5 g/dL — ABNORMAL LOW (ref 13.0–17.0)
MCH: 32.5 pg (ref 26.0–34.0)
MCHC: 35.4 g/dL (ref 30.0–36.0)
MCV: 91.8 fL (ref 80.0–100.0)
Platelets: 137 10*3/uL — ABNORMAL LOW (ref 150–400)
RBC: 3.54 MIL/uL — ABNORMAL LOW (ref 4.22–5.81)
RDW: 14.6 % (ref 11.5–15.5)
WBC: 6.1 10*3/uL (ref 4.0–10.5)
nRBC: 0 % (ref 0.0–0.2)

## 2021-02-21 LAB — GLUCOSE, CAPILLARY
Glucose-Capillary: 84 mg/dL (ref 70–99)
Glucose-Capillary: 59 mg/dL — ABNORMAL LOW (ref 70–99)
Glucose-Capillary: 110 mg/dL — ABNORMAL HIGH (ref 70–99)
Glucose-Capillary: 96 mg/dL (ref 70–99)

## 2021-02-21 IMAGING — DX DG CHEST 1V PORT
1 series · 1 of 1 positions shown · non-contrast
Comparison: Chest CT [DATE]

CLINICAL DATA: Post RIGHT Port-A-Cath insertion

EXAM:
PORTABLE CHEST 1 VIEW

[chest ap]
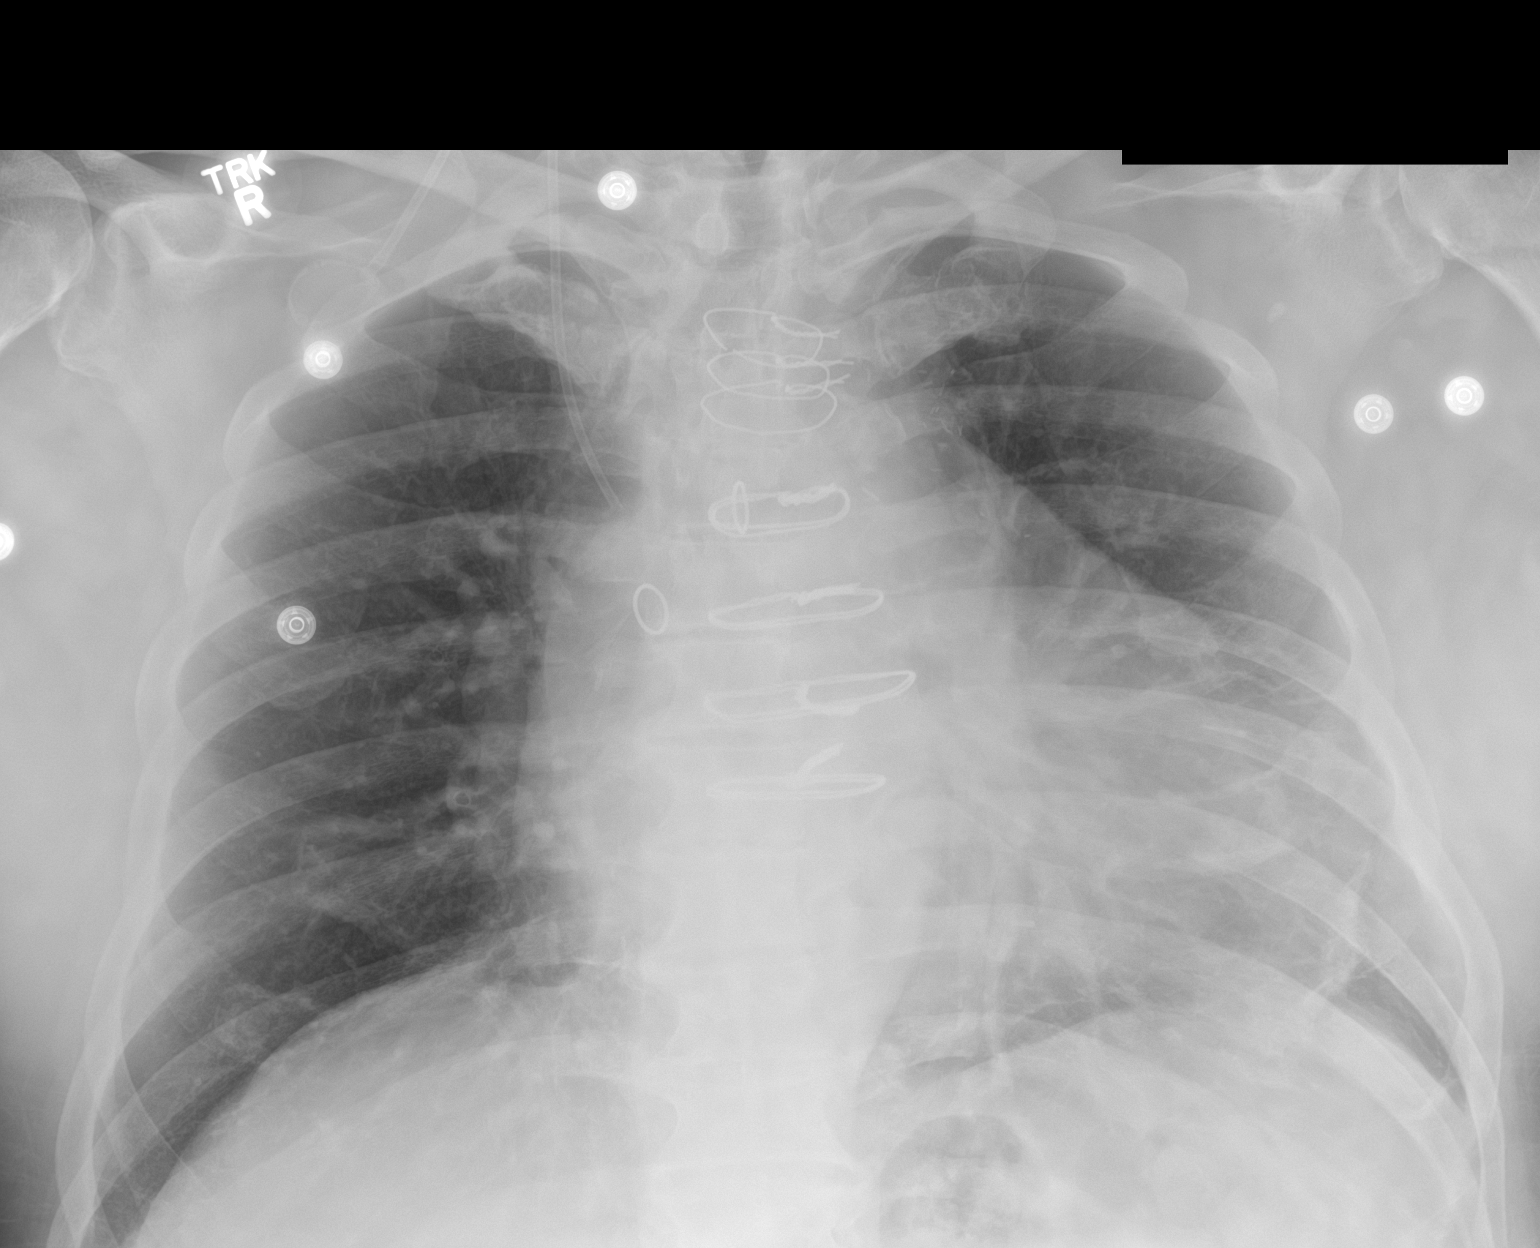

[1 of 1 positions shown; findings below may reference images not displayed]

FINDINGS: Port in the anterior chest wall with tip in distal SVC.

Sternotomy wires overlie normal cardiac silhouette. Normal pulmonary
vasculature. No effusion, infiltrate, or pneumothorax. No acute
osseous abnormality.
IMPRESSION: No complication following port placement.

## 2021-02-21 SURGERY — INSERTION, TUNNELED CENTRAL VENOUS DEVICE, WITH PORT
Anesthesia: General | Site: Chest | Laterality: Right

## 2021-02-21 SURGERY — INSERTION, TUNNELED CENTRAL VENOUS DEVICE, WITH PORT
Anesthesia: General

## 2021-02-21 MED ORDER — LACTATED RINGERS IV SOLN: INTRAVENOUS | Status: DC

## 2021-02-21 MED ORDER — EPHEDRINE SULFATE 50 MG/ML IJ SOLN
INTRAMUSCULAR | Status: DC | PRN
  Administered 2021-02-21 (×2): 5 mg via INTRAVENOUS

## 2021-02-21 MED ORDER — BUPIVACAINE HCL (PF) 0.25 % IJ SOLN
INTRAMUSCULAR | Status: DC | PRN
  Administered 2021-02-21: 10 mL

## 2021-02-21 MED ORDER — PROPOFOL 10 MG/ML IV BOLUS
INTRAVENOUS | Status: DC | PRN
  Administered 2021-02-21: 100 mg via INTRAVENOUS

## 2021-02-21 MED ORDER — LIDOCAINE 2% (20 MG/ML) 5 ML SYRINGE
INTRAMUSCULAR | Status: AC
  Filled 2021-02-21: qty 5

## 2021-02-21 MED ORDER — BUPIVACAINE HCL (PF) 0.25 % IJ SOLN
INTRAMUSCULAR | Status: AC
  Filled 2021-02-21: qty 30

## 2021-02-21 MED ORDER — TRAMADOL HCL 50 MG PO TABS: 50.0000 mg | ORAL_TABLET | Freq: Four times a day (QID) | ORAL | 0 refills | Status: DC | PRN

## 2021-02-21 MED ORDER — DEXTROSE 50 % IV SOLN
INTRAVENOUS | Status: DC | PRN
  Administered 2021-02-21: 12.5 g via INTRAVENOUS

## 2021-02-21 MED ORDER — DEXAMETHASONE SODIUM PHOSPHATE 10 MG/ML IJ SOLN
INTRAMUSCULAR | Status: AC
  Filled 2021-02-21: qty 1

## 2021-02-21 MED ORDER — LIDOCAINE 2% (20 MG/ML) 5 ML SYRINGE
INTRAMUSCULAR | Status: DC | PRN
  Administered 2021-02-21: 40 mg via INTRAVENOUS

## 2021-02-21 MED ORDER — MIDAZOLAM HCL 2 MG/2ML IJ SOLN
INTRAMUSCULAR | Status: AC
  Filled 2021-02-21: qty 2

## 2021-02-21 MED ORDER — DEXTROSE 5 % IV SOLN
INTRAVENOUS | Status: DC | PRN
  Administered 2021-02-21: 3 g via INTRAVENOUS

## 2021-02-21 MED ORDER — SODIUM CHLORIDE 0.9 % IV SOLN
INTRAVENOUS | Status: DC | PRN
  Administered 2021-02-21: 500 mL

## 2021-02-21 MED ORDER — FENTANYL CITRATE (PF) 250 MCG/5ML IJ SOLN
INTRAMUSCULAR | Status: AC
  Filled 2021-02-21: qty 5

## 2021-02-21 MED ORDER — HEPARIN SOD (PORK) LOCK FLUSH 100 UNIT/ML IV SOLN
INTRAVENOUS | Status: DC | PRN
  Administered 2021-02-21: 500 [IU]

## 2021-02-21 MED ORDER — PROPOFOL 10 MG/ML IV BOLUS
INTRAVENOUS | Status: AC
  Filled 2021-02-21: qty 20

## 2021-02-21 MED ORDER — ROCURONIUM BROMIDE 10 MG/ML (PF) SYRINGE
PREFILLED_SYRINGE | INTRAVENOUS | Status: AC
  Filled 2021-02-21: qty 10

## 2021-02-21 MED ORDER — CHLORHEXIDINE GLUCONATE 0.12 % MT SOLN: 15.0000 mL | Freq: Once | OROMUCOSAL | Status: AC

## 2021-02-21 MED ORDER — HEPARIN SOD (PORK) LOCK FLUSH 100 UNIT/ML IV SOLN
INTRAVENOUS | Status: AC
  Filled 2021-02-21: qty 5

## 2021-02-21 MED ORDER — DEXTROSE 50 % IV SOLN
12.5000 g | INTRAVENOUS | Status: AC
  Filled 2021-02-21: qty 50

## 2021-02-21 MED ORDER — ONDANSETRON HCL 4 MG/2ML IJ SOLN
INTRAMUSCULAR | Status: DC | PRN
  Administered 2021-02-21: 4 mg via INTRAVENOUS

## 2021-02-21 MED ORDER — SODIUM CHLORIDE 0.9 % IV SOLN
INTRAVENOUS | Status: AC
  Filled 2021-02-21: qty 1.2

## 2021-02-21 MED ORDER — ORAL CARE MOUTH RINSE: 15.0000 mL | Freq: Once | OROMUCOSAL | Status: AC

## 2021-02-21 MED ORDER — DEXTROSE 50 % IV SOLN
INTRAVENOUS | Status: AC
  Administered 2021-02-21: 12.5 g via INTRAVENOUS
  Filled 2021-02-21: qty 50

## 2021-02-21 MED ORDER — DEXAMETHASONE SODIUM PHOSPHATE 4 MG/ML IJ SOLN
INTRAMUSCULAR | Status: DC | PRN
  Administered 2021-02-21: 4 mg via INTRAVENOUS

## 2021-02-21 MED ORDER — CHLORHEXIDINE GLUCONATE 0.12 % MT SOLN
OROMUCOSAL | Status: AC
  Administered 2021-02-21: 15 mL via OROMUCOSAL
  Filled 2021-02-21: qty 15

## 2021-02-21 MED ORDER — FENTANYL CITRATE (PF) 100 MCG/2ML IJ SOLN
INTRAMUSCULAR | Status: DC | PRN
  Administered 2021-02-21: 50 ug via INTRAVENOUS
  Administered 2021-02-21: 25 ug via INTRAVENOUS

## 2021-02-21 SURGICAL SUPPLY — 32 items
BAG DECANTER FOR FLEXI CONT (MISCELLANEOUS) ×2 IMPLANT
CHLORAPREP W/TINT 26 (MISCELLANEOUS) ×2 IMPLANT
COVER SURGICAL LIGHT HANDLE (MISCELLANEOUS) ×2 IMPLANT
COVER TRANSDUCER ULTRASND GEL (DISPOSABLE) ×2 IMPLANT
DERMABOND ADVANCED (GAUZE/BANDAGES/DRESSINGS) ×1
DERMABOND ADVANCED .7 DNX12 (GAUZE/BANDAGES/DRESSINGS) ×1 IMPLANT
DRAPE C-ARM 42X120 X-RAY (DRAPES) ×2 IMPLANT
ELECT CAUTERY BLADE 6.4 (BLADE) ×2 IMPLANT
ELECT REM PT RETURN 9FT ADLT (ELECTROSURGICAL) ×2
ELECTRODE REM PT RTRN 9FT ADLT (ELECTROSURGICAL) ×1 IMPLANT
GAUZE SPONGE 2X2 8PLY STRL LF (GAUZE/BANDAGES/DRESSINGS) ×1 IMPLANT
GLOVE BIOGEL PI IND STRL 6 (GLOVE) ×1 IMPLANT
GLOVE BIOGEL PI INDICATOR 6 (GLOVE) ×1
GLOVE SURG SYN 5.5 (GLOVE) ×2 IMPLANT
GOWN STRL REUS W/ TWL LRG LVL3 (GOWN DISPOSABLE) ×2 IMPLANT
GOWN STRL REUS W/TWL LRG LVL3 (GOWN DISPOSABLE) ×2
KIT BASIN OR (CUSTOM PROCEDURE TRAY) ×2 IMPLANT
KIT PORT POWER 8FR ISP CVUE (Port) ×2 IMPLANT
KIT TURNOVER KIT B (KITS) ×2 IMPLANT
NS IRRIG 1000ML POUR BTL (IV SOLUTION) ×2 IMPLANT
PAD ARMBOARD 7.5X6 YLW CONV (MISCELLANEOUS) ×2 IMPLANT
PENCIL BUTTON HOLSTER BLD 10FT (ELECTRODE) ×2 IMPLANT
POSITIONER HEAD DONUT 9IN (MISCELLANEOUS) ×2 IMPLANT
SPONGE GAUZE 2X2 STER 10/PKG (GAUZE/BANDAGES/DRESSINGS) ×1
SUT MNCRL AB 4-0 PS2 18 (SUTURE) ×2 IMPLANT
SUT PROLENE 2 0 SH DA (SUTURE) ×4 IMPLANT
SUT VIC AB 3-0 SH 27 (SUTURE) ×1
SUT VIC AB 3-0 SH 27X BRD (SUTURE) ×1 IMPLANT
SYR 5ML LUER SLIP (SYRINGE) ×2 IMPLANT
TOWEL GREEN STERILE (TOWEL DISPOSABLE) ×2 IMPLANT
TOWEL GREEN STERILE FF (TOWEL DISPOSABLE) ×2 IMPLANT
TRAY LAPAROSCOPIC MC (CUSTOM PROCEDURE TRAY) ×2 IMPLANT

## 2021-02-21 NOTE — Op Note (Signed)
Date: 02/21/21  Patient: [name] MRN: [id number]  Preoperative Diagnosis: Pancreatic adenocarcinoma Postoperative Diagnosis: Same  Procedure: Portacath insertion  Surgeon: Michaelle Birks, MD  EBL: Minimal  Anesthesia: General LMA  Specimens: None  Indications: [name] is a 75 yo male who was recently diagnosed with pancreatic adenocarcinoma. He is to undergo neoadjuvant chemotherapy and presents today for port placement.  Findings: 8-Fr port placed via the right internal jugular vein under fluoroscopic guidance. Total catheter length of 20cm.  Procedure details: Informed consent was obtained in the preoperative area prior to the procedure. The patient was brought to the operating room and placed on the table in the supine position. General anesthesia was induced and appropriate lines and drains were placed for intraoperative monitoring. Perioperative antibiotics were administered per SCIP guidelines. The chest and neck were prepped and draped in the usual sterile fashion. A pre-procedure timeout was taken verifying patient identity, surgical site and procedure to be performed.  The patient was placed in Trendelenberg and access of the right subclavian vein was attempted but was not successful. The right internal jugular vein was accessed with a large-bore needle under ultrasound guidance. A guidewire was inserted and advanced, and position in the SVC was confirmed fluoroscopically. The needle was removed and the wire was clipped to the drapes to secure its position. Next a place for the port was chosen on the upper lateral right chest and the skin was infiltrated with 0.25% bupivicaine. A small skin incision was made and a subcutaneous pocket was created with cautery. The port and catheter were then flushed and brought onto the field. Three 3-0 prolene sutures were used to secure the port in the subcutaneous pocket, but the sutures were not tied down. The port was placed in  the pocket and the attached cathether was tunneled beneath the skin to the wire exit site. The catheter was then measured using fluoro - it was placed over the skin adjacent to the guidewire, and marked externally at the cavoatrial junction. The catheter was then cut at this location, which was at 20cm. The dilator and sheath were then advanced over the guidewire, and the wire and dilator were removed. The end of the catheter was inserted through the sheath and advanced, and the sheath was peeled away. The port was then accessed with a Huber needle, and blood was aspirated and the port was flushed with heparinized saline. A final fluoroscopic image confirmed appropriate position of the catheter tip within the SVC, without kinking of the catheter. The prolene sutures were tied down. A final flush of 500 units heparin (100 units/mL) was given via the port. The skin was closed with a deep layer of interrupted 3-0 Vicryl suture, followed by a running subcuticular 4-0 monocryl suture. Dermabond was applied.  The patient tolerated the procedure well with no apparent complications. All counts were correct x2 at the end of the procedure. The patient was extubated and taken to PACU in stable condition.  Michaelle Birks, MD 02/21/21 2:19 PM

## 2021-02-21 NOTE — Anesthesia Preprocedure Evaluation (Addendum)
Anesthesia Evaluation  Patient identified by MRN, date of birth, ID band Patient awake    Reviewed: Allergy & Precautions, NPO status , Patient's Chart, lab work & pertinent test results, reviewed documented beta blocker date and time   History of Anesthesia Complications (+) DIFFICULT AIRWAY  Airway Mallampati: III  TM Distance: >3 FB Neck ROM: Full    Dental  (+) Dental Advisory Given, Chipped   Pulmonary sleep apnea and Continuous Positive Airway Pressure Ventilation , former smoker,  02/18/2021 SARS coronavirus NEG   breath sounds clear to auscultation       Cardiovascular hypertension, Pt. on medications and Pt. on home beta blockers (-) angina+ CAD and + CABG   Rhythm:Regular Rate:Normal  '18 cath: Three-vessel coronary artery disease with patent LIMA graft to the LAD and vein graft to the RCA, 40-50% occlusion in the proximal segment of  the vein graft to the ramus.  Overall normal left ventricular systolic function but with minimal  inferior hypokinesis, ejection fraction 60%.  Overall normal left ventricular diastolic function, LVEDP 18 mmHg.      Neuro/Psych  Headaches, Anxiety Depression    GI/Hepatic GERD  Medicated,Pancreatic cancer   Endo/Other  diabetes, Insulin DependentMorbid obesity  Renal/GU Renal InsufficiencyRenal disease (creat 1.46)     Musculoskeletal  (+) Arthritis ,   Abdominal (+) + obese,   Peds  Hematology  (+) Blood dyscrasia (Hb 11.5), anemia ,   Anesthesia Other Findings   Reproductive/Obstetrics                            Anesthesia Physical Anesthesia Plan  ASA: III  Anesthesia Plan: General   Post-op Pain Management:    Induction: Intravenous  PONV Risk Score and Plan: 2 and Ondansetron and Dexamethasone  Airway Management Planned: LMA  Additional Equipment: None  Intra-op Plan:   Post-operative Plan:   Informed Consent: I have reviewed  the patients History and Physical, chart, labs and discussed the procedure including the risks, benefits and alternatives for the proposed anesthesia with the patient or authorized representative who has indicated his/her understanding and acceptance.     Dental advisory given  Plan Discussed with: CRNA and Surgeon  Anesthesia Plan Comments:         Anesthesia Quick Evaluation

## 2021-02-21 NOTE — Anesthesia Procedure Notes (Signed)
Procedure Name: LMA Insertion Date/Time: 02/21/2021 1:23 PM Performed by: Amadeo Garnet, CRNA Pre-anesthesia Checklist: Patient identified, Emergency Drugs available, Suction available and Patient being monitored Patient Re-evaluated:Patient Re-evaluated prior to induction Oxygen Delivery Method: Circle system utilized Preoxygenation: Pre-oxygenation with 100% oxygen Induction Type: IV induction LMA: LMA inserted LMA Size: 5.0 Placement Confirmation: positive ETCO2 and breath sounds checked- equal and bilateral Dental Injury: Teeth and Oropharynx as per pre-operative assessment

## 2021-02-21 NOTE — Transfer of Care (Signed)
Immediate Anesthesia Transfer of Care Note  Patient: [name]  Procedure(s) Performed: INSERTION PORT-A-CATH (Right Chest)  Patient Location: PACU  Anesthesia Type:General  Level of Consciousness: awake, alert , patient cooperative and responds to stimulation  Airway & Oxygen Therapy: Patient Spontanous Breathing and Patient connected to face mask oxygen  Post-op Assessment: Report given to RN, Post -op Vital signs reviewed and stable and Patient moving all extremities  Post vital signs: Reviewed and stable  Last Vitals:  Vitals Value Taken Time  BP 145/68 02/21/21 1420  Temp    Pulse 71 02/21/21 1421  Resp 13 02/21/21 1421  SpO2 100 % 02/21/21 1421  Vitals shown include unvalidated device data.  Last Pain:  Vitals:   02/21/21 1056  TempSrc:   PainSc: 0-No pain         Complications: No complications documented.

## 2021-02-21 NOTE — H&P (Signed)
[name] is an 75 y.o. male.   Chief Complaint: pancreatic cancer HPI: [name] is a 75 yo male with a recently-diagnosed adenocarcinoma of the pancreatic neck. He will be undergoing neoadjuvant chemotherapy prior to resection and presents today for port placement. COVID test was negative on 3/7.  Past Medical History:  Diagnosis Date  . Anemia   . Arthritis   . Cancer [facility])    prostate  . Chronic kidney disease    blood in urine   . Constipation   . Coronary artery disease   . Depression   . Diabetes mellitus without complication ([place])   . Difficult intubation    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 it was hard to get the tube down his throat  . Fatty liver   . Frequent headaches   . GERD (gastroesophageal reflux disease)   . History of chicken pox   . History of fainting spells of unknown cause   . Hyperlipidemia   . Hypertension   . Myocardial infarction ([place])   . Pneumonia   . PTSD (post-traumatic stress disorder)   . Sleep apnea    uses Cpap    Past Surgical History:  Procedure Laterality Date  . APPENDECTOMY    . BIOPSY  01/15/2021   Procedure: BIOPSY;  Surgeon: Rush Landmark Telford Nab., MD;  Location: [place];  Service: Gastroenterology;;  . CARDIAC SURGERY     Triple Bypass  . CHOLECYSTECTOMY  2010  . CORONARY ARTERY BYPASS GRAFT  2004  . ESOPHAGOGASTRODUODENOSCOPY (EGD) WITH PROPOFOL N/A 01/15/2021   Procedure: ESOPHAGOGASTRODUODENOSCOPY (EGD) WITH PROPOFOL;  Surgeon: Rush Landmark Telford Nab., MD;  Location: [place];  Service: Gastroenterology;  Laterality: N/A;  . EUS N/A 01/15/2021   Procedure: UPPER ENDOSCOPIC ULTRASOUND (EUS) RADIAL;  Surgeon: Rush Landmark Telford Nab., MD;  Location: [place];  Service: Gastroenterology;  Laterality: N/A;  . FINE NEEDLE ASPIRATION  01/15/2021   Procedure: FINE NEEDLE ASPIRATION (FNA) LINEAR;  Surgeon: Irving Copas., MD;  Location: [place];  Service: Gastroenterology;;  . TONSILLECTOMY  1958    Family  History  Problem Relation Age of Onset  . Arthritis Mother   . Cancer Mother   . Heart disease Mother   . Hyperlipidemia Mother   . Hypertension Mother   . Heart attack Mother   . Cancer Sister   . Heart attack Brother   . Heart disease Brother   . Depression Daughter   . Arthritis Sister   . Cancer Brother   . Learning disabilities Brother   . Alcohol abuse Brother   . COPD Brother   . Heart attack Brother   . Heart disease Brother    Social History:  reports that he quit smoking about 45 years ago. He has never used smokeless tobacco. He reports previous alcohol use. He reports that he does not use drugs.  Allergies:  Allergies  Allergen Reactions  . Furosemide Other (See Comments)    unknown  . Lisinopril Other (See Comments)    Unknown  . Terazosin Other (See Comments)    unknown    Medications Prior to Admission  Medication Sig Dispense Refill  . aspirin 81 MG chewable tablet Chew 81 mg by mouth at bedtime.    Marland Kitchen atorvastatin (LIPITOR) 80 MG tablet Take 40 mg by mouth at bedtime.    . calcium carbonate (OS-CAL) 600 MG TABS tablet Take 600 mg by mouth 2 (two) times daily.    . cycloSPORINE (RESTASIS) 0.05 % ophthalmic emulsion Place 1 drop  into both eyes 2 (two) times daily.    Marland Kitchen FLUoxetine (PROZAC) 20 MG capsule Take 40 mg by mouth daily.    . hydrochlorothiazide (HYDRODIURIL) 25 MG tablet Take 25 mg by mouth daily.    . insulin glargine (LANTUS) 100 UNIT/ML injection Inject 0.75 mLs (75 Units total) into the skin daily. (Patient taking differently: Inject 75 Units into the skin daily.) 10 mL 3  . isosorbide mononitrate (IMDUR) 30 MG 24 hr tablet Take 30 mg by mouth daily.    Marland Kitchen losartan (COZAAR) 100 MG tablet Take 100 mg by mouth daily.    . metoprolol tartrate (LOPRESSOR) 25 MG tablet Take 25 mg by mouth 2 (two) times daily.    . Multiple Vitamin (MULTI-VITAMINS) TABS Take 1 tablet by mouth daily.    . nitroGLYCERIN (NITROSTAT) 0.4 MG SL tablet Place 0.4 mg under  the tongue every 5 (five) minutes as needed for chest pain.    . pantoprazole (PROTONIX) 40 MG tablet Take 1 tablet (40 mg total) by mouth daily. 30 tablet 6  . PREBIOTIC PRODUCT PO Take 3 tablets by mouth daily.    . tamsulosin (FLOMAX) 0.4 MG CAPS capsule Take 0.8 mg by mouth at bedtime.    . traZODone (DESYREL) 100 MG tablet Take 100 mg by mouth at bedtime.    . fluticasone (FLONASE) 50 MCG/ACT nasal spray INSTILL 2 SPRAYS IN EACH NOSTRIL EVERY EVENING 16 g 3  . lidocaine-prilocaine (EMLA) cream Apply to port site 1-2 hours prior to use 30 g 3  . Probiotic CHEW Chew 3 tablets by mouth daily. (Patient not taking: No sig reported)    . prochlorperazine (COMPAZINE) 10 MG tablet Take 1 tablet (10 mg total) by mouth every 6 (six) hours as needed for nausea or vomiting. 30 tablet 2  . Vibegron (GEMTESA) 75 MG TABS Take 75 mg by mouth daily.      Results for orders placed or performed during the hospital encounter of 02/21/21 (from the past 48 hour(s))  CBC per protocol     Status: Abnormal   Collection Time: 02/21/21 10:43 AM  Result Value Ref Range   WBC 6.1 4.0 - 10.5 K/uL   RBC 3.54 (L) 4.22 - 5.81 MIL/uL   Hemoglobin 11.5 (L) 13.0 - 17.0 g/dL   HCT 32.5 (L) 39.0 - 52.0 %   MCV 91.8 80.0 - 100.0 fL   MCH 32.5 26.0 - 34.0 pg   MCHC 35.4 30.0 - 36.0 g/dL   RDW 14.6 11.5 - 15.5 %   Platelets 137 (L) 150 - 400 K/uL   nRBC 0.0 0.0 - 0.2 %    Comment: Performed at [facility], [place] [address], [place], [place] [address]  Comprehensive metabolic panel per protocol     Status: Abnormal   Collection Time: 02/21/21 10:43 AM  Result Value Ref Range   Sodium 138 135 - 145 mmol/L   Potassium 3.1 (L) 3.5 - 5.1 mmol/L   Chloride 103 98 - 111 mmol/L   CO2 26 22 - 32 mmol/L   Glucose, Bld 70 70 - 99 mg/dL    Comment: Glucose reference range applies only to samples taken after fasting for at least 8 hours.   BUN 18 8 - 23 mg/dL   Creatinine, Ser 1.46 (H) 0.61 - 1.24 mg/dL   Calcium 9.4 8.9  - 10.3 mg/dL   Total Protein 6.5 6.5 - 8.1 g/dL   Albumin 3.5 3.5 - 5.0 g/dL   AST 26  15 - 41 U/L   ALT 18 0 - 44 U/L   Alkaline Phosphatase 57 38 - 126 U/L   Total Bilirubin 1.2 0.3 - 1.2 mg/dL   GFR, Estimated 50 (L) >60 mL/min    Comment: (NOTE) Calculated using the CKD-EPI Creatinine Equation (2021)    Anion gap 9 5 - 15    Comment: Performed at [facility] [address], [place], [place] [address]  Glucose, capillary     Status: None   Collection Time: 02/21/21 10:45 AM  Result Value Ref Range   Glucose-Capillary 84 70 - 99 mg/dL    Comment: Glucose reference range applies only to samples taken after fasting for at least 8 hours.  Glucose, capillary     Status: Abnormal   Collection Time: 02/21/21 12:44 PM  Result Value Ref Range   Glucose-Capillary 59 (L) 70 - 99 mg/dL    Comment: Glucose reference range applies only to samples taken after fasting for at least 8 hours.   No results found.  Review of Systems  Constitutional: Negative for chills and fever.    Blood pressure (!) 118/58, pulse 68, temperature 98 F (36.7 C), temperature source Oral, resp. rate 17, height 5\' 11"  (1.803 m), weight 125.2 kg, SpO2 98 %. Physical Exam Constitutional:      Appearance: Normal appearance.  HENT:     Head: Normocephalic and atraumatic.  Eyes:     General: No scleral icterus. Pulmonary:     Effort: Pulmonary effort is normal. No respiratory distress.  Abdominal:     Palpations: Abdomen is soft.     Tenderness: There is no abdominal tenderness.  Musculoskeletal:        General: Normal range of motion.     Cervical back: Normal range of motion.  Skin:    General: Skin is warm and dry.  Neurological:     General: No focal deficit present.     Mental Status: He is alert and oriented to person, place, and time.  Psychiatric:        Mood and Affect: Mood normal.        Behavior: Behavior normal.        Thought Content: Thought content normal.       Assessment/Plan 75 yo male with pancreatic adenocarcinoma. Proceed to OR for portacath placement. Risks and benefits discussed, including bleeding, infection and pneumothorax. Will obtain CXR in PACU. Plan for discharge home from PACU.  Dwan Bolt, MD 02/21/2021, 12:50 PM

## 2021-02-21 NOTE — Progress Notes (Addendum)
CRITICAL RESULT PROVIDER NOTIFICATION  Test performed and critical result:  CBG @1245   Date and time result received:  02/21/2021 @1245   Provider name/title: Dr. Annye Asa (Anesthesiologist)   Date and time provider notified: 02/21/2021 @1245   Date and time provider responded: 02/21/2021 @1245   Provider response: See orders  Hypoglycemic Event  CBG: 59  Treatment: D50 25 mL (12.5 gm)  Symptoms: Pale and Sweaty  Follow-up CBG: Time: per anesthesiologist/CRNA to be checked in OR   Possible Reasons for Event: Inadequate meal intake/NPO  Comments/MD notified:Provider at bedside @1250   [place]

## 2021-02-21 NOTE — Discharge Instructions (Signed)
SURGERY DISCHARGE INSTRUCTIONS: PORT-A-CATH PLACEMENT ° °Activity °You may resume your usual activities as tolerated °Ok to shower in 48 hours, but do not bathe or submerge incisions underwater. °Do not drive while taking narcotic pain medication. ° °Wound Care °Your incision is covered with skin glue called Dermabond. This will peel off on its own over time. °You may shower and allow warm soapy water to run over your incisions. Gently pat dry. °Do not submerge your incision underwater. °Monitor your incision for any new redness, tenderness, or drainage. °You may start using your port in 48 hours. Do not apply EMLA cream directly over the Dermabond (skin glue). ° °When to Call Us: °Fever greater than 100.5 °New redness, drainage, or swelling at incision site °Severe pain, nausea, or vomiting °Shortness of breath, difficulty breathing ° °For questions or concerns, please call the [facility] office at [phone]. ° °

## 2021-02-21 NOTE — Anesthesia Postprocedure Evaluation (Signed)
Anesthesia Post Note  Patient: [name]  Procedure(s) Performed: INSERTION PORT-A-CATH (Right Chest)     Patient location during evaluation: PACU Anesthesia Type: General Level of consciousness: awake and alert Pain management: pain level controlled Vital Signs Assessment: post-procedure vital signs reviewed and stable Respiratory status: spontaneous breathing, nonlabored ventilation and respiratory function stable Cardiovascular status: blood pressure returned to baseline and stable Postop Assessment: no apparent nausea or vomiting Anesthetic complications: no   No complications documented.  Last Vitals:  Vitals:   02/21/21 1500 02/21/21 1505  BP:  139/72  Pulse: 70 66  Resp: 12 12  Temp:    SpO2: 97% 96%    Last Pain:  Vitals:   02/21/21 1445  TempSrc:   PainSc: 0-No pain                 Hildegard Hlavac,W. EDMOND

## 2021-02-22 ENCOUNTER — Encounter (HOSPITAL_COMMUNITY): Payer: Self-pay | Admitting: Surgery

## 2021-02-22 NOTE — Progress Notes (Signed)
Pharmacist Chemotherapy Monitoring - Initial Assessment    Anticipated start date: 03/01/21   Regimen:  . Are orders appropriate based on the patient's diagnosis, regimen, and cycle? Yes . Does the plan date match the patient's scheduled date? Yes . Is the sequencing of drugs appropriate? Yes . Are the premedications appropriate for the patient's regimen? Yes . Prior Authorization for treatment is: Approved o If applicable, is the correct biosimilar selected based on the patient's insurance? not applicable  Organ Function and Labs: Marland Kitchen Are dose adjustments needed based on the patient's renal function, hepatic function, or hematologic function? Yes . Are appropriate labs ordered prior to the start of patient's treatment? Yes . Other organ system assessment, if indicated: N/A . The following baseline labs, if indicated, have been ordered: N/A  Dose Assessment: . Are the drug doses appropriate? Yes . Are the following correct: o Drug concentrations Yes o IV fluid compatible with drug Yes o Administration routes Yes o Timing of therapy Yes . If applicable, does the patient have documented access for treatment and/or plans for port-a-cath placement? yes . If applicable, have lifetime cumulative doses been properly documented and assessed? not applicable Lifetime Dose Tracking  No doses have been documented on this patient for the following tracked chemicals: Doxorubicin, Epirubicin, Idarubicin, Daunorubicin, Mitoxantrone, Bleomycin, Oxaliplatin, Carboplatin, Liposomal Doxorubicin  o   Toxicity Monitoring/Prevention: . The patient has the following take home antiemetics prescribed: N/A . The patient has the following take home medications prescribed: N/A . Medication allergies and previous infusion related reactions, if applicable, have been reviewed and addressed. Yes . The patient's current medication list has been assessed for drug-drug interactions with their chemotherapy regimen. no  significant drug-drug interactions were identified on review.  Order Review: . Are the treatment plan orders signed? No . Is the patient scheduled to see a provider prior to their treatment? Yes  I verify that I have reviewed each item in the above checklist and answered each question accordingly.  Dontasia Miranda K, RPH, 02/22/2021  1:32 PM

## 2021-02-23 NOTE — Progress Notes (Unsigned)
Cardiology Office Note   Date:  02/27/2021   ID:  [name], [name] 24-Aug-1946, MRN [id number]  PCP:  [name], [name] G, MD  Cardiologist:   Peter [name], MD   Chief Complaint  Patient presents with  . Coronary Artery Disease      History of Present Illness: [name] is a 75 y.o. male who is seen at the request of Dr. [name] for evaluation of CAD. He has a history of DM on insulin, CKD, HTN and HLD. He has known CAD s/p CABG in 2004. Previously followed by Dr. Peterson Ao at [facility] in [place]. Last Cardiac cath in July 2019 for evaluation of chest pain showed 90% proximal LAD, occluded nondominant LCx and occluded RCA. The grafts were patent including LIMA to the LAD, SVG to RCA and SVG to ramus with 40-50% proximal stenosis. Normal EF and EDP. 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   He  moved from [place] to be close to family. 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 He is married. He is  sedentary. Notes his legs get weak and he uses a cane to walk.  Has had some falls in the past. Denies any anginal symptoms.   In January 2022 he was diagnosed with pancreatic adenocarcinoma- noted incidentally on MRI during work up for UTI.  He has since had portAcath placed with plans for neoadjuvant chemotherapy prior to resection.      Past Medical History:  Diagnosis Date  . Anemia   . Arthritis   . Cancer [facility])    prostate  . Chronic kidney disease    blood in urine   . Constipation   . Coronary artery disease   . Depression   . Diabetes mellitus without complication ([place])   . Difficult intubation    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 it was hard to get the tube down his throat  . Fatty liver   . Frequent headaches   . GERD (gastroesophageal reflux disease)   . History of chicken pox   . History of fainting spells of unknown cause   . Hyperlipidemia   . Hypertension   . Myocardial infarction  ([place])   . Pneumonia   . PTSD (post-traumatic stress disorder)   . Sleep apnea    uses Cpap    Past Surgical History:  Procedure Laterality Date  . APPENDECTOMY    . BIOPSY  01/15/2021   Procedure: BIOPSY;  Surgeon: Rush Landmark Telford Nab., MD;  Location: [facility];  Service: Gastroenterology;;  . CARDIAC SURGERY     Triple Bypass  . CHOLECYSTECTOMY  2010  . CORONARY ARTERY BYPASS GRAFT  2004  . ESOPHAGOGASTRODUODENOSCOPY (EGD) WITH PROPOFOL N/A 01/15/2021   Procedure: ESOPHAGOGASTRODUODENOSCOPY (EGD) WITH PROPOFOL;  Surgeon: Rush Landmark Telford Nab., MD;  Location: [place];  Service: Gastroenterology;  Laterality: N/A;  . EUS N/A 01/15/2021   Procedure: UPPER ENDOSCOPIC ULTRASOUND (EUS) RADIAL;  Surgeon: Rush Landmark Telford Nab., MD;  Location: [facility];  Service: Gastroenterology;  Laterality: N/A;  . FINE NEEDLE ASPIRATION  01/15/2021   Procedure: FINE NEEDLE ASPIRATION (FNA) LINEAR;  Surgeon: Irving Copas., MD;  Location: [facility];  Service: Gastroenterology;;  . Sol Passer PLACEMENT Right 02/21/2021   Procedure: INSERTION PORT-A-CATH;  Surgeon: Dwan Bolt, MD;  Location: [facility];  Service: General;  Laterality: Right;  . TONSILLECTOMY  1958     Current Outpatient Medications  Medication Sig Dispense Refill  . aspirin 81 MG chewable tablet Chew 81 mg by mouth at bedtime.    Marland Kitchen atorvastatin (LIPITOR) 80 MG tablet Take 40 mg by mouth at bedtime.    . calcium carbonate (OS-CAL) 600 MG TABS tablet Take 600 mg by mouth 2 (two) times daily.    . cycloSPORINE (RESTASIS) 0.05 % ophthalmic emulsion Place 1 drop into both eyes 2 (two) times daily.    Marland Kitchen FLUoxetine (PROZAC) 20 MG capsule Take 40 mg by mouth daily.    . fluticasone (FLONASE) 50 MCG/ACT nasal spray INSTILL 2 SPRAYS IN EACH NOSTRIL EVERY EVENING 16 g 3  . hydrochlorothiazide (HYDRODIURIL) 25 MG tablet Take 25 mg by mouth daily.    . insulin glargine (LANTUS) 100 UNIT/ML injection Inject 0.75 mLs (75 Units  total) into the skin daily. (Patient taking differently: Inject 75 Units into the skin daily.) 10 mL 3  . isosorbide mononitrate (IMDUR) 30 MG 24 hr tablet Take 30 mg by mouth daily.    Marland Kitchen lidocaine-prilocaine (EMLA) cream Apply to port site 1-2 hours prior to use 30 g 3  . losartan (COZAAR) 100 MG tablet Take 100 mg by mouth daily.    . metoprolol tartrate (LOPRESSOR) 25 MG tablet Take 25 mg by mouth 2 (two) times daily.    . Multiple Vitamin (MULTI-VITAMINS) TABS Take 1 tablet by mouth daily.    . nitroGLYCERIN (NITROSTAT) 0.4 MG SL tablet Place 0.4 mg under the tongue every 5 (five) minutes as needed for chest pain.    . pantoprazole (PROTONIX) 40 MG tablet Take 1 tablet (40 mg total) by mouth daily. 30 tablet 6  . prochlorperazine (COMPAZINE) 10 MG tablet Take 1 tablet (10 mg total) by mouth every 6 (six) hours as needed for nausea or vomiting. 30 tablet 2  . tamsulosin (FLOMAX) 0.4 MG CAPS capsule Take 0.8 mg by mouth at bedtime.    . traMADol (ULTRAM) 50 MG tablet Take 1 tablet (50 mg total) by mouth every 6 (six) hours as needed for severe pain. 5 tablet 0  . traZODone (DESYREL) 100 MG tablet Take 100 mg by mouth at bedtime.    . Wheat Dextrin (BENEFIBER DRINK MIX PO) Take by mouth.    . Wheat Dextrin (BENEFIBER PO) Take 1 tablet by mouth daily.     No current facility-administered medications for this visit.    Allergies:   Furosemide, Lisinopril, and Terazosin    Social History:  The patient  reports that he quit smoking about 45 years ago. He has never used smokeless tobacco. He reports previous alcohol use. He reports that he does not use drugs.   Family History:  The patient's family history includes Alcohol abuse in his brother; Arthritis in his mother and sister; COPD in his brother; Cancer in his brother, mother, and sister; Depression in his daughter; Heart attack in his brother, brother, and mother; Heart disease in his brother, brother, and mother; Hyperlipidemia in his  mother; Hypertension in his mother; Learning disabilities in his brother.    ROS:  Please see the history of present illness.   Otherwise, review of systems are positive for none.   All other systems are reviewed and negative.    PHYSICAL EXAM: VS:  BP (!) 151/71   Pulse 63   Ht 5\' 11"  (1.803 m)   Wt 283 lb (128.4 kg)   SpO2 99%   BMI 39.47 kg/m  , BMI Body mass index is 39.47 kg/m. GEN: Well  nourished, obese, in no acute distress  HEENT: normal  Neck: no JVD, carotid bruits, or masses, new Port A Cath in right upper chest.  Cardiac: RRR; no murmurs, rubs, or gallops,no edema  Respiratory:  clear to auscultation bilaterally, normal work of breathing GI: soft, nontender, nondistended, + BS MS: no deformity or atrophy  Skin: warm and dry, no rash Neuro:  Strength and sensation are intact Psych: euthymic mood, full affect   EKG:  EKG is ordered today. The ekg ordered today demonstrates NSR with rate 63. No acute change.  I have personally reviewed and interpreted this study.    Recent Labs: 02/21/2021: ALT 18; BUN 18; Creatinine, Ser 1.46; Hemoglobin 11.5; Platelets 137; Potassium 3.1; Sodium 138 A1c 6.3%   Lipid Panel    Component Value Date/Time   CHOL 113 10/10/2020 0940   CHOL 101 12/16/2018 1003   TRIG 166 (H) 10/10/2020 0940   HDL 34 (L) 10/10/2020 0940   HDL 38 (L) 12/16/2018 1003   CHOLHDL 3.3 10/10/2020 0940   LDLCALC 55 10/10/2020 0940      Wt Readings from Last 3 Encounters:  02/27/21 283 lb (128.4 kg)  02/21/21 276 lb (125.2 kg)  02/14/21 280 lb 11.2 oz (127.3 kg)      Other studies Reviewed: Additional studies/ records that were reviewed today include:  Labs dated 06/26/17: Hgb 12.8. Creatinine 1.43. potassium 3.4.    ASSESSMENT AND PLAN:  1.  CAD s/p remote CABG in 2004. Cardiac cath in July 2019 showed patent grafts. He is asymptomatic.  Continue ASA, statin, Imdur, metoprolol. Follow up in one year. He is cleared from my standpoint for  pancreatic surgery.  2. DM on insulin with complication of CKD and neuropathy 3. HLD on Zocor.  4. HTN controlled.  5. Pancreatic adenocarcinoma- planning chemotherapy for 4 months followed by surgery. 6. PTSD   Current medicines are reviewed at length with the patient today.  The patient does not have concerns regarding medicines.  The following changes have been made:  no change  Labs/ tests ordered today include:  No orders of the defined types were placed in this encounter.    Disposition:   FU with me in 1 year  Signed, Peter [name], MD  02/27/2021 4:54 PM    [facility] [address], [place], [place], [address] Phone [phone], Fax [phone]

## 2021-02-24 ENCOUNTER — Other Ambulatory Visit: Payer: Self-pay | Admitting: Oncology

## 2021-02-27 ENCOUNTER — Encounter: Payer: Self-pay | Admitting: Cardiology

## 2021-02-27 ENCOUNTER — Other Ambulatory Visit: Payer: Self-pay

## 2021-02-27 ENCOUNTER — Ambulatory Visit (INDEPENDENT_AMBULATORY_CARE_PROVIDER_SITE_OTHER): Payer: Medicare Other | Admitting: Cardiology

## 2021-02-27 VITALS — BP 151/71 | HR 63 | Ht 71.0 in | Wt 283.0 lb

## 2021-02-27 DIAGNOSIS — I25810 Atherosclerosis of coronary artery bypass graft(s) without angina pectoris: Secondary | ICD-10-CM

## 2021-02-27 DIAGNOSIS — I10 Essential (primary) hypertension: Secondary | ICD-10-CM | POA: Diagnosis not present

## 2021-02-27 DIAGNOSIS — E785 Hyperlipidemia, unspecified: Secondary | ICD-10-CM

## 2021-02-27 DIAGNOSIS — Z794 Long term (current) use of insulin: Secondary | ICD-10-CM

## 2021-02-27 DIAGNOSIS — C259 Malignant neoplasm of pancreas, unspecified: Secondary | ICD-10-CM

## 2021-02-27 DIAGNOSIS — E118 Type 2 diabetes mellitus with unspecified complications: Secondary | ICD-10-CM | POA: Diagnosis not present

## 2021-02-27 DIAGNOSIS — Z951 Presence of aortocoronary bypass graft: Secondary | ICD-10-CM

## 2021-02-28 ENCOUNTER — Encounter: Payer: Self-pay | Admitting: Oncology

## 2021-02-28 NOTE — Progress Notes (Signed)
Called pt to introduce myself as his Financial Resource Specialist and to discuss the [employer] grant.  Pt has 2 insurances so copay assistance shouldn't be needed.  I left a msg requesting he return my call if he would like to apply for the grant.  °

## 2021-03-01 ENCOUNTER — Other Ambulatory Visit: Payer: Self-pay

## 2021-03-01 ENCOUNTER — Inpatient Hospital Stay: Payer: Medicare Other

## 2021-03-01 VITALS — BP 114/60 | HR 63 | Temp 98.5°F | Resp 18

## 2021-03-01 DIAGNOSIS — Z8546 Personal history of malignant neoplasm of prostate: Secondary | ICD-10-CM | POA: Diagnosis not present

## 2021-03-01 DIAGNOSIS — C250 Malignant neoplasm of head of pancreas: Secondary | ICD-10-CM

## 2021-03-01 DIAGNOSIS — C259 Malignant neoplasm of pancreas, unspecified: Secondary | ICD-10-CM | POA: Diagnosis not present

## 2021-03-01 DIAGNOSIS — E119 Type 2 diabetes mellitus without complications: Secondary | ICD-10-CM | POA: Diagnosis not present

## 2021-03-01 DIAGNOSIS — Z95828 Presence of other vascular implants and grafts: Secondary | ICD-10-CM

## 2021-03-01 DIAGNOSIS — G629 Polyneuropathy, unspecified: Secondary | ICD-10-CM | POA: Diagnosis not present

## 2021-03-01 DIAGNOSIS — I10 Essential (primary) hypertension: Secondary | ICD-10-CM | POA: Diagnosis not present

## 2021-03-01 DIAGNOSIS — Z5111 Encounter for antineoplastic chemotherapy: Secondary | ICD-10-CM | POA: Diagnosis not present

## 2021-03-01 LAB — CBC WITH DIFFERENTIAL (CANCER CENTER ONLY)
Abs Immature Granulocytes: 0.01 10*3/uL (ref 0.00–0.07)
Basophils Absolute: 0 10*3/uL (ref 0.0–0.1)
Basophils Relative: 0 %
Eosinophils Absolute: 0.1 10*3/uL (ref 0.0–0.5)
Eosinophils Relative: 2 %
HCT: 32.7 % — ABNORMAL LOW (ref 39.0–52.0)
Hemoglobin: 11.1 g/dL — ABNORMAL LOW (ref 13.0–17.0)
Immature Granulocytes: 0 %
Lymphocytes Relative: 16 %
Lymphs Abs: 1.1 10*3/uL (ref 0.7–4.0)
MCH: 31.4 pg (ref 26.0–34.0)
MCHC: 33.9 g/dL (ref 30.0–36.0)
MCV: 92.6 fL (ref 80.0–100.0)
Monocytes Absolute: 0.6 10*3/uL (ref 0.1–1.0)
Monocytes Relative: 8 %
Neutro Abs: 5 10*3/uL (ref 1.7–7.7)
Neutrophils Relative %: 74 %
Platelet Count: 163 10*3/uL (ref 150–400)
RBC: 3.53 MIL/uL — ABNORMAL LOW (ref 4.22–5.81)
RDW: 14.9 % (ref 11.5–15.5)
WBC Count: 6.8 10*3/uL (ref 4.0–10.5)
nRBC: 0 % (ref 0.0–0.2)

## 2021-03-01 LAB — CMP (CANCER CENTER ONLY)
ALT: 19 U/L (ref 0–44)
AST: 25 U/L (ref 15–41)
Albumin: 3.6 g/dL (ref 3.5–5.0)
Alkaline Phosphatase: 69 U/L (ref 38–126)
Anion gap: 6 (ref 5–15)
BUN: 21 mg/dL (ref 8–23)
CO2: 26 mmol/L (ref 22–32)
Calcium: 8.9 mg/dL (ref 8.9–10.3)
Chloride: 104 mmol/L (ref 98–111)
Creatinine: 1.42 mg/dL — ABNORMAL HIGH (ref 0.61–1.24)
GFR, Estimated: 51 mL/min — ABNORMAL LOW (ref >60)
Glucose, Bld: 181 mg/dL — ABNORMAL HIGH (ref 70–99)
Potassium: 3.5 mmol/L (ref 3.5–5.1)
Sodium: 136 mmol/L (ref 135–145)
Total Bilirubin: 1 mg/dL (ref 0.3–1.2)
Total Protein: 7 g/dL (ref 6.5–8.1)

## 2021-03-01 MED ORDER — PACLITAXEL PROTEIN-BOUND CHEMO INJECTION 100 MG
100.0000 mg/m2 | Freq: Once | INTRAVENOUS | Status: AC
  Administered 2021-03-01: 250 mg via INTRAVENOUS
  Filled 2021-03-01: qty 50

## 2021-03-01 MED ORDER — HEPARIN SOD (PORK) LOCK FLUSH 100 UNIT/ML IV SOLN
500.0000 [IU] | Freq: Once | INTRAVENOUS | Status: AC | PRN
Start: 2021-03-01 — End: 2021-03-01
  Administered 2021-03-01: 500 [IU]
  Filled 2021-03-01: qty 5

## 2021-03-01 MED ORDER — SODIUM CHLORIDE 0.9% FLUSH
10.0000 mL | INTRAVENOUS | Status: DC | PRN
  Administered 2021-03-01: 10 mL via INTRAVENOUS
  Filled 2021-03-01: qty 10

## 2021-03-01 MED ORDER — SODIUM CHLORIDE 0.9 % IV SOLN
Freq: Once | INTRAVENOUS | Status: AC
  Filled 2021-03-01: qty 250

## 2021-03-01 MED ORDER — PROCHLORPERAZINE MALEATE 10 MG PO TABS
ORAL_TABLET | ORAL | Status: AC
  Filled 2021-03-01: qty 1

## 2021-03-01 MED ORDER — SODIUM CHLORIDE 0.9% FLUSH
10.0000 mL | INTRAVENOUS | Status: DC | PRN
  Administered 2021-03-01: 10 mL
  Filled 2021-03-01: qty 10

## 2021-03-01 MED ORDER — SODIUM CHLORIDE 0.9 % IV SOLN
1000.0000 mg/m2 | Freq: Once | INTRAVENOUS | Status: AC
  Administered 2021-03-01: 2546 mg via INTRAVENOUS
  Filled 2021-03-01: qty 66.96

## 2021-03-01 MED ORDER — PROCHLORPERAZINE MALEATE 10 MG PO TABS
10.0000 mg | ORAL_TABLET | Freq: Once | ORAL | Status: AC
  Administered 2021-03-01: 10 mg via ORAL

## 2021-03-01 NOTE — Patient Instructions (Signed)
Implanted Port Insertion, Care After This sheet gives you information about how to care for yourself after your procedure. Your health care provider may also give you more specific instructions. If you have problems or questions, contact your health care provider. What can I expect after the procedure? After the procedure, it is common to have:  Discomfort at the port insertion site.  Bruising on the skin over the port. This should improve over 3-4 days. Follow these instructions at home: Port care  After your port is placed, you will get a manufacturer's information card. The card has information about your port. Keep this card with you at all times.  Take care of the port as told by your health care provider. Ask your health care provider if you or a family member can get training for taking care of the port at home. A home health care nurse may also take care of the port.  Make sure to remember what type of port you have. Incision care  Follow instructions from your health care provider about how to take care of your port insertion site. Make sure you: ? Wash your hands with soap and water before and after you change your bandage (dressing). If soap and water are not available, use hand sanitizer. ? Change your dressing as told by your health care provider. ? Leave stitches (sutures), skin glue, or adhesive strips in place. These skin closures may need to stay in place for 2 weeks or longer. If adhesive strip edges start to loosen and curl up, you may trim the loose edges. Do not remove adhesive strips completely unless your health care provider tells you to do that.  Check your port insertion site every day for signs of infection. Check for: ? Redness, swelling, or pain. ? Fluid or blood. ? Warmth. ? Pus or a bad smell.      Activity  Return to your normal activities as told by your health care provider. Ask your health care provider what activities are safe for you.  Do not  lift anything that is heavier than 10 lb (4.5 kg), or the limit that you are told, until your health care provider says that it is safe. General instructions  Take over-the-counter and prescription medicines only as told by your health care provider.  Do not take baths, swim, or use a hot tub until your health care provider approves. Ask your health care provider if you may take showers. You may only be allowed to take sponge baths.  Do not drive for 24 hours if you were given a sedative during your procedure.  Wear a medical alert bracelet in case of an emergency. This will tell any health care providers that you have a port.  Keep all follow-up visits as told by your health care provider. This is important. Contact a health care provider if:  You cannot flush your port with saline as directed, or you cannot draw blood from the port.  You have a fever or chills.  You have redness, swelling, or pain around your port insertion site.  You have fluid or blood coming from your port insertion site.  Your port insertion site feels warm to the touch.  You have pus or a bad smell coming from the port insertion site. Get help right away if:  You have chest pain or shortness of breath.  You have bleeding from your port that you cannot control. Summary  Take care of the port as told by your   health care provider. Keep the manufacturer's information card with you at all times.  Change your dressing as told by your health care provider.  Contact a health care provider if you have a fever or chills or if you have redness, swelling, or pain around your port insertion site.  Keep all follow-up visits as told by your health care provider. This information is not intended to replace advice given to you by your health care provider. Make sure you discuss any questions you have with your health care provider. Document Revised: 06/29/2018 Document Reviewed: 06/29/2018 Elsevier Patient Education   2021 [employer]vier Inc.  

## 2021-03-01 NOTE — Patient Instructions (Signed)
[facility] Discharge Instructions for Patients Receiving Chemotherapy  Today you received the following chemotherapy agents gemzar, abraxane  To help prevent nausea and vomiting after your treatment, we encourage you to take your nausea medication as directed.   If you develop nausea and vomiting that is not controlled by your nausea medication, call the clinic.   BELOW ARE SYMPTOMS THAT SHOULD BE REPORTED IMMEDIATELY:  *FEVER GREATER THAN 100.5 F  *CHILLS WITH OR WITHOUT FEVER  NAUSEA AND VOMITING THAT IS NOT CONTROLLED WITH YOUR NAUSEA MEDICATION  *UNUSUAL SHORTNESS OF BREATH  *UNUSUAL BRUISING OR BLEEDING  TENDERNESS IN MOUTH AND THROAT WITH OR WITHOUT PRESENCE OF ULCERS  *URINARY PROBLEMS  *BOWEL PROBLEMS  UNUSUAL RASH Items with * indicate a potential emergency and should be followed up as soon as possible.  Feel free to call the clinic should you have any questions or concerns. The clinic phone number is [phone].  Please show the [facility] at check-in to the Emergency Department and triage nurse.

## 2021-03-04 ENCOUNTER — Emergency Department (HOSPITAL_COMMUNITY): Payer: Medicare Other

## 2021-03-04 ENCOUNTER — Observation Stay (HOSPITAL_COMMUNITY): Payer: Medicare Other

## 2021-03-04 ENCOUNTER — Other Ambulatory Visit: Payer: Self-pay

## 2021-03-04 ENCOUNTER — Inpatient Hospital Stay (HOSPITAL_COMMUNITY)
Admission: EM | Admit: 2021-03-04 | Discharge: 2021-03-07 | DRG: 312 | Disposition: A | Discharge status: Home / Self Care | Payer: Medicare Other | Attending: Family Medicine | Admitting: Family Medicine

## 2021-03-04 ENCOUNTER — Encounter (HOSPITAL_COMMUNITY): Payer: Self-pay

## 2021-03-04 DIAGNOSIS — G4733 Obstructive sleep apnea (adult) (pediatric): Secondary | ICD-10-CM | POA: Diagnosis present

## 2021-03-04 DIAGNOSIS — C250 Malignant neoplasm of head of pancreas: Secondary | ICD-10-CM | POA: Diagnosis present

## 2021-03-04 DIAGNOSIS — E876 Hypokalemia: Secondary | ICD-10-CM | POA: Diagnosis present

## 2021-03-04 DIAGNOSIS — Z8546 Personal history of malignant neoplasm of prostate: Secondary | ICD-10-CM

## 2021-03-04 DIAGNOSIS — T361X5A Adverse effect of cephalosporins and other beta-lactam antibiotics, initial encounter: Secondary | ICD-10-CM | POA: Diagnosis not present

## 2021-03-04 DIAGNOSIS — I951 Orthostatic hypotension: Principal | ICD-10-CM | POA: Diagnosis present

## 2021-03-04 DIAGNOSIS — L270 Generalized skin eruption due to drugs and medicaments taken internally: Secondary | ICD-10-CM | POA: Diagnosis present

## 2021-03-04 DIAGNOSIS — E871 Hypo-osmolality and hyponatremia: Secondary | ICD-10-CM | POA: Diagnosis not present

## 2021-03-04 DIAGNOSIS — Z20822 Contact with and (suspected) exposure to covid-19: Secondary | ICD-10-CM | POA: Diagnosis not present

## 2021-03-04 DIAGNOSIS — R55 Syncope and collapse: Secondary | ICD-10-CM | POA: Diagnosis not present

## 2021-03-04 DIAGNOSIS — L03113 Cellulitis of right upper limb: Secondary | ICD-10-CM | POA: Diagnosis present

## 2021-03-04 DIAGNOSIS — Z7982 Long term (current) use of aspirin: Secondary | ICD-10-CM

## 2021-03-04 DIAGNOSIS — Z87891 Personal history of nicotine dependence: Secondary | ICD-10-CM

## 2021-03-04 DIAGNOSIS — Z8261 Family history of arthritis: Secondary | ICD-10-CM

## 2021-03-04 DIAGNOSIS — Z8249 Family history of ischemic heart disease and other diseases of the circulatory system: Secondary | ICD-10-CM

## 2021-03-04 DIAGNOSIS — K219 Gastro-esophageal reflux disease without esophagitis: Secondary | ICD-10-CM | POA: Diagnosis present

## 2021-03-04 DIAGNOSIS — E1140 Type 2 diabetes mellitus with diabetic neuropathy, unspecified: Secondary | ICD-10-CM | POA: Diagnosis present

## 2021-03-04 DIAGNOSIS — Y92239 Unspecified place in hospital as the place of occurrence of the external cause: Secondary | ICD-10-CM | POA: Diagnosis not present

## 2021-03-04 DIAGNOSIS — N1831 Chronic kidney disease, stage 3a: Secondary | ICD-10-CM | POA: Diagnosis present

## 2021-03-04 DIAGNOSIS — Z923 Personal history of irradiation: Secondary | ICD-10-CM

## 2021-03-04 DIAGNOSIS — Z9049 Acquired absence of other specified parts of digestive tract: Secondary | ICD-10-CM

## 2021-03-04 DIAGNOSIS — E1122 Type 2 diabetes mellitus with diabetic chronic kidney disease: Secondary | ICD-10-CM | POA: Diagnosis present

## 2021-03-04 DIAGNOSIS — Z951 Presence of aortocoronary bypass graft: Secondary | ICD-10-CM

## 2021-03-04 DIAGNOSIS — K760 Fatty (change of) liver, not elsewhere classified: Secondary | ICD-10-CM | POA: Diagnosis present

## 2021-03-04 DIAGNOSIS — I4891 Unspecified atrial fibrillation: Secondary | ICD-10-CM

## 2021-03-04 DIAGNOSIS — R441 Visual hallucinations: Secondary | ICD-10-CM | POA: Diagnosis not present

## 2021-03-04 DIAGNOSIS — I248 Other forms of acute ischemic heart disease: Secondary | ICD-10-CM | POA: Diagnosis not present

## 2021-03-04 DIAGNOSIS — Z818 Family history of other mental and behavioral disorders: Secondary | ICD-10-CM

## 2021-03-04 DIAGNOSIS — I517 Cardiomegaly: Secondary | ICD-10-CM | POA: Diagnosis not present

## 2021-03-04 DIAGNOSIS — I959 Hypotension, unspecified: Secondary | ICD-10-CM | POA: Diagnosis not present

## 2021-03-04 DIAGNOSIS — T451X5A Adverse effect of antineoplastic and immunosuppressive drugs, initial encounter: Secondary | ICD-10-CM | POA: Diagnosis present

## 2021-03-04 DIAGNOSIS — I480 Paroxysmal atrial fibrillation: Secondary | ICD-10-CM | POA: Diagnosis present

## 2021-03-04 DIAGNOSIS — R509 Fever, unspecified: Secondary | ICD-10-CM | POA: Diagnosis not present

## 2021-03-04 DIAGNOSIS — E669 Obesity, unspecified: Secondary | ICD-10-CM | POA: Diagnosis present

## 2021-03-04 DIAGNOSIS — Z825 Family history of asthma and other chronic lower respiratory diseases: Secondary | ICD-10-CM

## 2021-03-04 DIAGNOSIS — E785 Hyperlipidemia, unspecified: Secondary | ICD-10-CM | POA: Diagnosis present

## 2021-03-04 DIAGNOSIS — D701 Agranulocytosis secondary to cancer chemotherapy: Secondary | ICD-10-CM | POA: Diagnosis present

## 2021-03-04 DIAGNOSIS — Z83438 Family history of other disorder of lipoprotein metabolism and other lipidemia: Secondary | ICD-10-CM

## 2021-03-04 DIAGNOSIS — D6959 Other secondary thrombocytopenia: Secondary | ICD-10-CM | POA: Diagnosis present

## 2021-03-04 DIAGNOSIS — Z79899 Other long term (current) drug therapy: Secondary | ICD-10-CM

## 2021-03-04 DIAGNOSIS — I252 Old myocardial infarction: Secondary | ICD-10-CM

## 2021-03-04 DIAGNOSIS — Z811 Family history of alcohol abuse and dependence: Secondary | ICD-10-CM

## 2021-03-04 DIAGNOSIS — D630 Anemia in neoplastic disease: Secondary | ICD-10-CM | POA: Diagnosis present

## 2021-03-04 DIAGNOSIS — I129 Hypertensive chronic kidney disease with stage 1 through stage 4 chronic kidney disease, or unspecified chronic kidney disease: Secondary | ICD-10-CM | POA: Diagnosis present

## 2021-03-04 DIAGNOSIS — I2510 Atherosclerotic heart disease of native coronary artery without angina pectoris: Secondary | ICD-10-CM | POA: Diagnosis present

## 2021-03-04 DIAGNOSIS — Z888 Allergy status to other drugs, medicaments and biological substances status: Secondary | ICD-10-CM

## 2021-03-04 DIAGNOSIS — Z794 Long term (current) use of insulin: Secondary | ICD-10-CM

## 2021-03-04 DIAGNOSIS — Z6839 Body mass index (BMI) 39.0-39.9, adult: Secondary | ICD-10-CM

## 2021-03-04 DIAGNOSIS — I493 Ventricular premature depolarization: Secondary | ICD-10-CM | POA: Diagnosis present

## 2021-03-04 DIAGNOSIS — R531 Weakness: Secondary | ICD-10-CM | POA: Diagnosis not present

## 2021-03-04 HISTORY — DX: Unspecified atrial fibrillation: I48.91

## 2021-03-04 LAB — HEMOGLOBIN A1C
Hgb A1c MFr Bld: 6.3 % — ABNORMAL HIGH (ref 4.8–5.6)
Mean Plasma Glucose: 134.11 mg/dL

## 2021-03-04 LAB — CBC WITH DIFFERENTIAL/PLATELET
Abs Immature Granulocytes: 0.02 10*3/uL (ref 0.00–0.07)
Basophils Absolute: 0 10*3/uL (ref 0.0–0.1)
Basophils Relative: 0 %
Eosinophils Absolute: 0 10*3/uL (ref 0.0–0.5)
Eosinophils Relative: 0 %
HCT: 30.2 % — ABNORMAL LOW (ref 39.0–52.0)
Hemoglobin: 10.5 g/dL — ABNORMAL LOW (ref 13.0–17.0)
Immature Granulocytes: 0 %
Lymphocytes Relative: 5 %
Lymphs Abs: 0.4 10*3/uL — ABNORMAL LOW (ref 0.7–4.0)
MCH: 32 pg (ref 26.0–34.0)
MCHC: 34.8 g/dL (ref 30.0–36.0)
MCV: 92.1 fL (ref 80.0–100.0)
Monocytes Absolute: 0.1 10*3/uL (ref 0.1–1.0)
Monocytes Relative: 1 %
Neutro Abs: 7.2 10*3/uL (ref 1.7–7.7)
Neutrophils Relative %: 94 %
Platelets: 104 10*3/uL — ABNORMAL LOW (ref 150–400)
RBC: 3.28 MIL/uL — ABNORMAL LOW (ref 4.22–5.81)
RDW: 14.4 % (ref 11.5–15.5)
WBC: 7.7 10*3/uL (ref 4.0–10.5)
nRBC: 0 % (ref 0.0–0.2)

## 2021-03-04 LAB — ECHOCARDIOGRAM COMPLETE
Area-P 1/2: 4.6 cm2
Height: 71 in
S' Lateral: 3.1 cm

## 2021-03-04 LAB — MAGNESIUM: Magnesium: 1.5 mg/dL — ABNORMAL LOW (ref 1.7–2.4)

## 2021-03-04 LAB — TROPONIN I (HIGH SENSITIVITY)
Troponin I (High Sensitivity): 44 ng/L — ABNORMAL HIGH (ref <18)
Troponin I (High Sensitivity): 40 ng/L — ABNORMAL HIGH (ref <18)

## 2021-03-04 LAB — RESP PANEL BY RT-PCR (FLU A&B, COVID) ARPGX2
Influenza A by PCR: NEGATIVE
Influenza B by PCR: NEGATIVE
SARS Coronavirus 2 by RT PCR: NEGATIVE

## 2021-03-04 LAB — LACTIC ACID, PLASMA: Lactic Acid, Venous: 1.4 mmol/L (ref 0.5–1.9)

## 2021-03-04 LAB — COMPREHENSIVE METABOLIC PANEL
ALT: 50 U/L — ABNORMAL HIGH (ref 0–44)
AST: 81 U/L — ABNORMAL HIGH (ref 15–41)
Albumin: 3.1 g/dL — ABNORMAL LOW (ref 3.5–5.0)
Alkaline Phosphatase: 50 U/L (ref 38–126)
Anion gap: 7 (ref 5–15)
BUN: 19 mg/dL (ref 8–23)
CO2: 24 mmol/L (ref 22–32)
Calcium: 8.2 mg/dL — ABNORMAL LOW (ref 8.9–10.3)
Chloride: 102 mmol/L (ref 98–111)
Creatinine, Ser: 1.17 mg/dL (ref 0.61–1.24)
GFR, Estimated: >60 mL/min (ref >60)
Glucose, Bld: 138 mg/dL — ABNORMAL HIGH (ref 70–99)
Potassium: 3.3 mmol/L — ABNORMAL LOW (ref 3.5–5.1)
Sodium: 133 mmol/L — ABNORMAL LOW (ref 135–145)
Total Bilirubin: 2.5 mg/dL — ABNORMAL HIGH (ref 0.3–1.2)
Total Protein: 6.2 g/dL — ABNORMAL LOW (ref 6.5–8.1)

## 2021-03-04 LAB — URINALYSIS, ROUTINE W REFLEX MICROSCOPIC
Bilirubin Urine: NEGATIVE
Glucose, UA: NEGATIVE mg/dL
Hgb urine dipstick: NEGATIVE
Ketones, ur: NEGATIVE mg/dL
Leukocytes,Ua: NEGATIVE
Nitrite: NEGATIVE
Protein, ur: NEGATIVE mg/dL
Specific Gravity, Urine: 1.013 (ref 1.005–1.030)
pH: 6 (ref 5.0–8.0)

## 2021-03-04 LAB — GLUCOSE, CAPILLARY
Glucose-Capillary: 163 mg/dL — ABNORMAL HIGH (ref 70–99)
Glucose-Capillary: 140 mg/dL — ABNORMAL HIGH (ref 70–99)
Glucose-Capillary: 221 mg/dL — ABNORMAL HIGH (ref 70–99)

## 2021-03-04 LAB — D-DIMER, QUANTITATIVE: D-Dimer, Quant: 2.2 ug/mL-FEU — ABNORMAL HIGH (ref 0.00–0.50)

## 2021-03-04 IMAGING — CT CT HEAD W/O CM
3 of 4 series · 14 of 47 positions shown, 16 images · non-contrast
Comparison: None.

CLINICAL DATA: 74-year-old male status post 1st treatment for
pancreatic cancer 3 days ago. Syncope, loss of consciousness.

EXAM:
CT HEAD WITHOUT CONTRAST
TECHNIQUE: Contiguous axial images were obtained from the base of the skull
through the vertex without intravenous contrast.

[Series 5: coronal soft tissue · coronal · 0.34mm/px · 3 of 75 slices shown]
[im 25/75  brain]
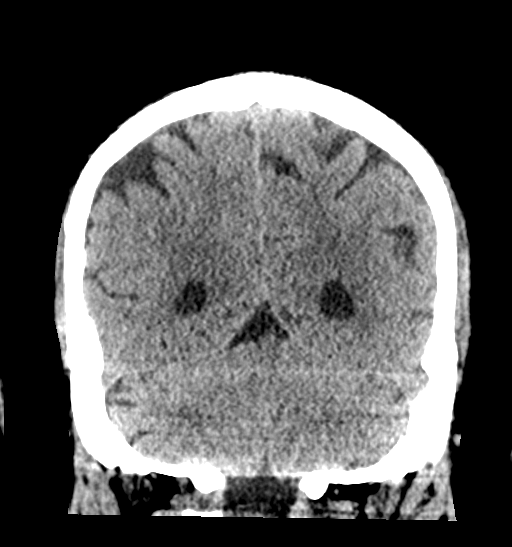
[im 33/75  brain]
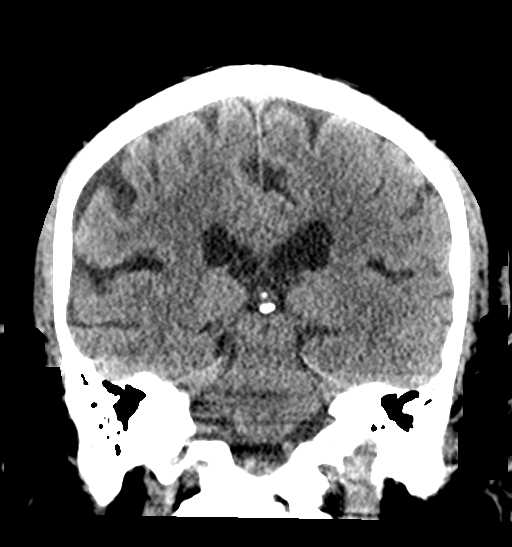
[im 42/75  brain]
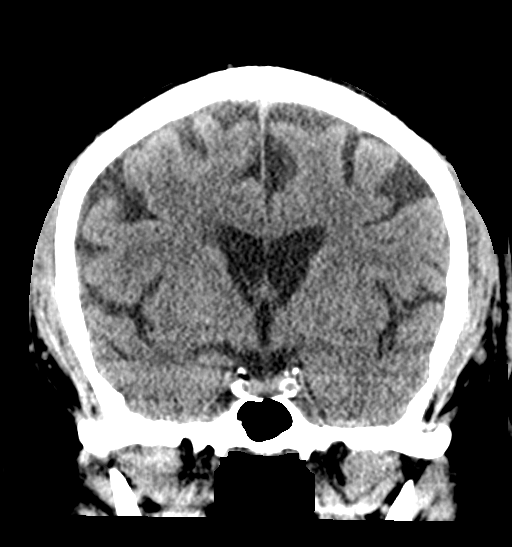

[Series 6: sagittal soft tissue · sagittal · 0.37mm/px · 3 of 62 slices shown]
[im 21/62  brain]
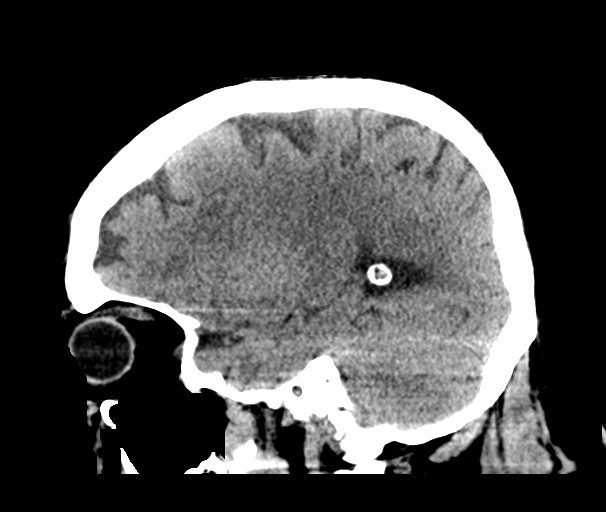
[im 31/62  brain]
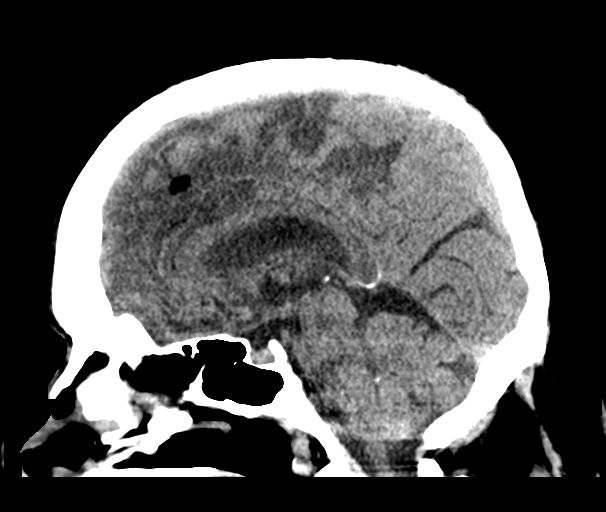
[im 41/62  brain]
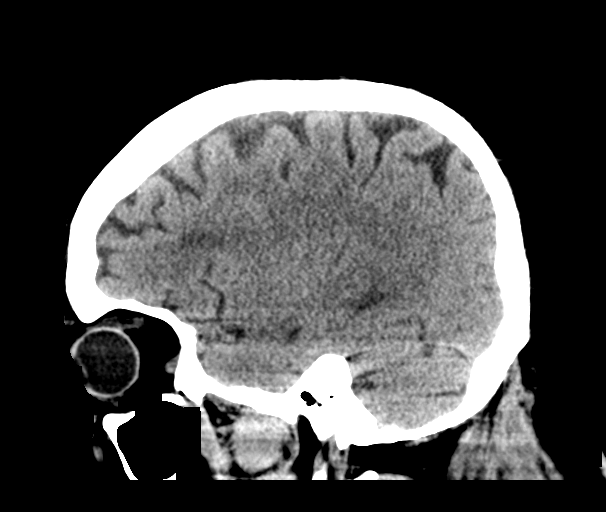

[Series 7: true axial · axial · 0.36mm/px · z∈[-151,-15]mm · 8 of 60 slices shown, 10 images]
[im 7/60  brain]
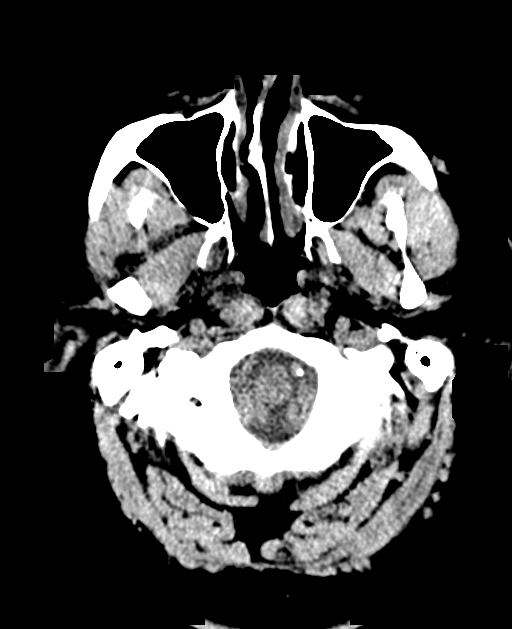
[im 7/60  bone]
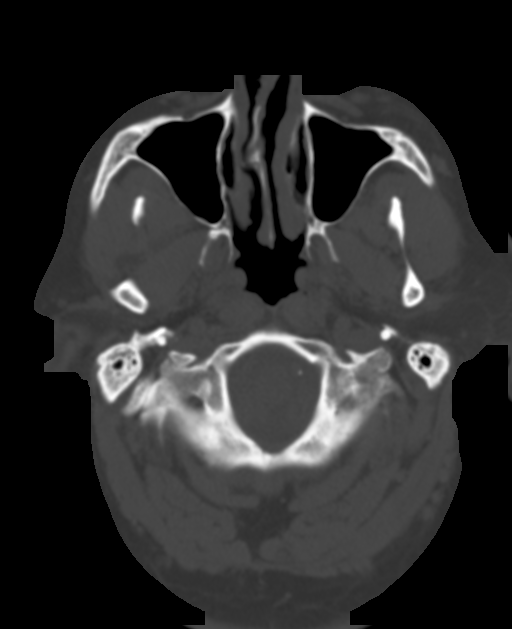
[im 14/60  brain]
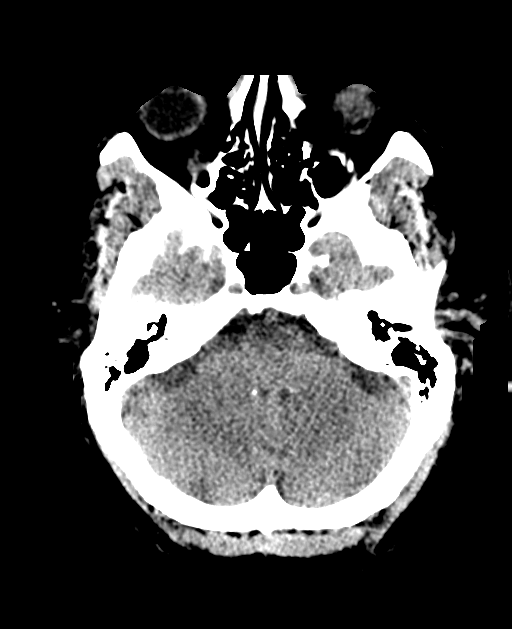
[im 20/60  brain]
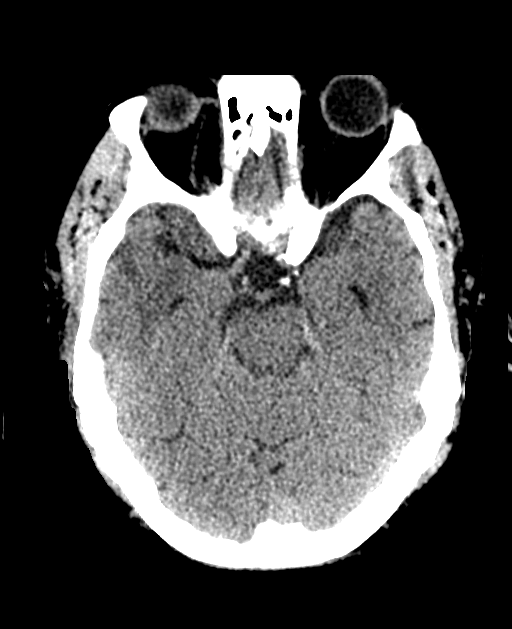
[im 27/60  brain]
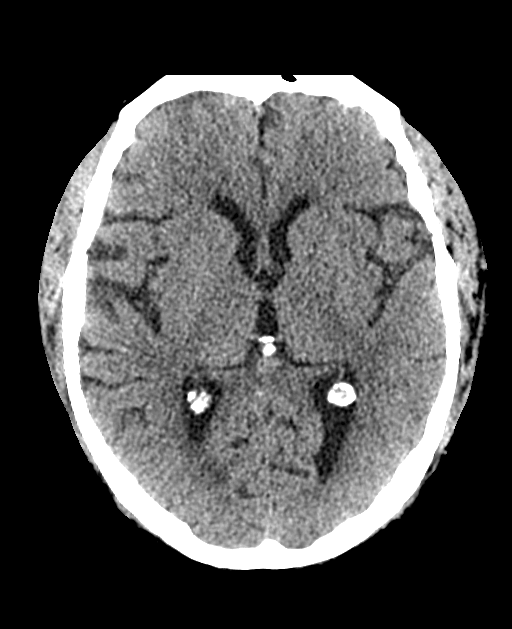
[im 33/60  brain]
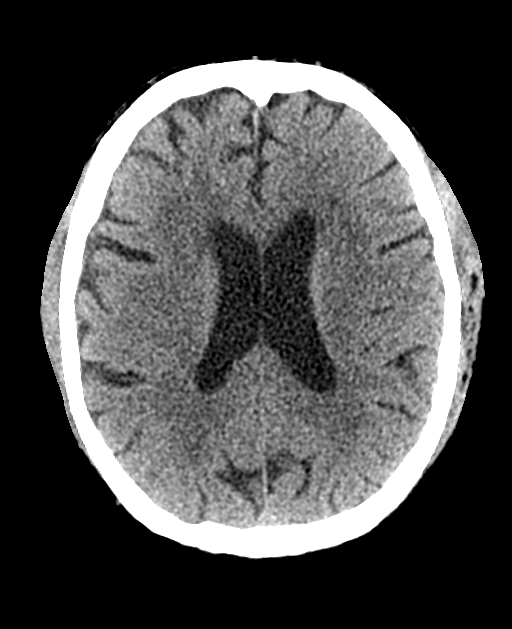
[im 33/60  bone]
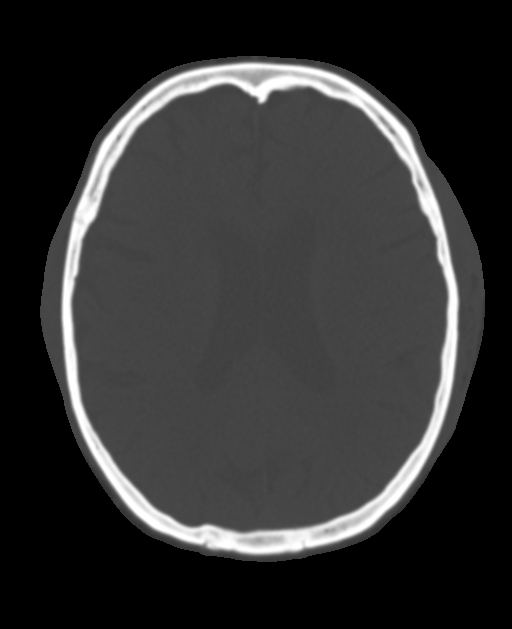
[im 40/60  brain]
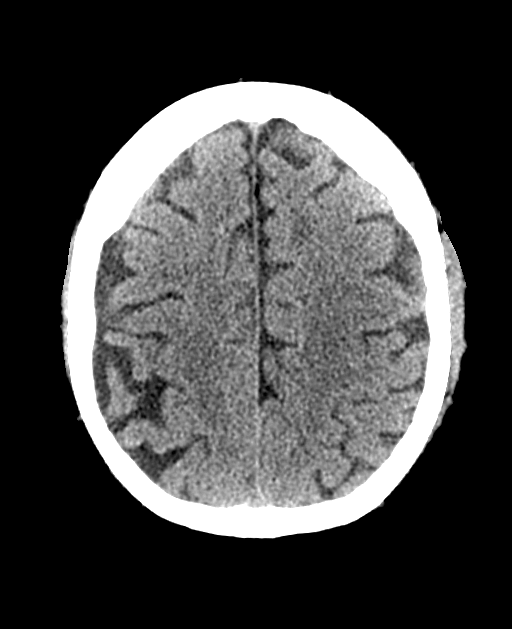
[im 46/60  brain]
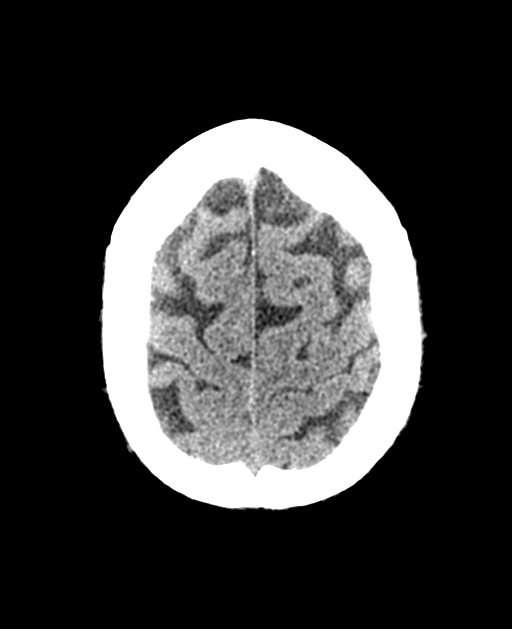
[im 53/60  brain]
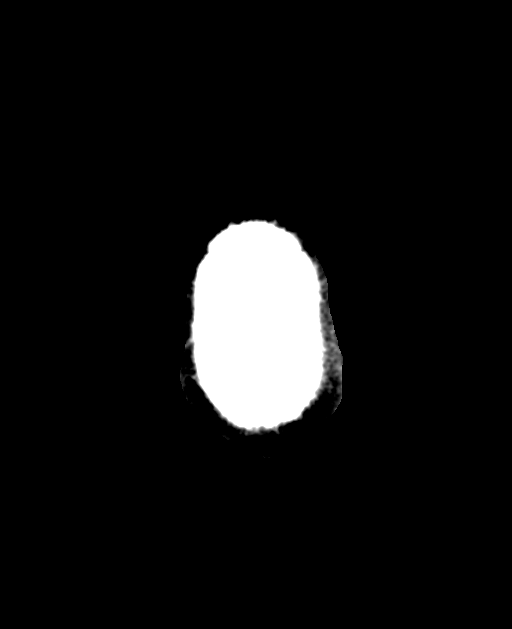

[14 of 47 positions shown; findings below may reference images not displayed]

FINDINGS: Brain: Cerebral volume is within normal limits for age. No midline
shift, mass effect, or evidence of intracranial mass lesion. No
ventriculomegaly.

Patchy bilateral white matter hypodensity. Deep gray matter nuclei
relatively spared. No acute or chronic cortical infarct identified.
No acute intracranial hemorrhage identified.

Vascular: Calcified atherosclerosis at the skull base. No suspicious
intracranial vascular hyperdensity.

Skull: No acute or suspicious osseous lesion.

Sinuses/Orbits: Visualized paranasal sinuses and mastoids are clear.

Other: No acute orbit or scalp soft tissue finding. Postoperative
changes to both globes.
IMPRESSION: Moderate for age cerebral white matter changes most commonly due to
small vessel disease.
No acute or metastatic intracranial abnormality identified on this
noncontrast exam.

## 2021-03-04 IMAGING — DX DG CHEST 1V PORT
2 series · 2 of 2 positions shown · non-contrast
Comparison: Radiograph [DATE]

CLINICAL DATA: Fever following first chemotherapy for pancreatic
cancer

EXAM:
PORTABLE CHEST 1 VIEW

[chest ap (1 of 2)]
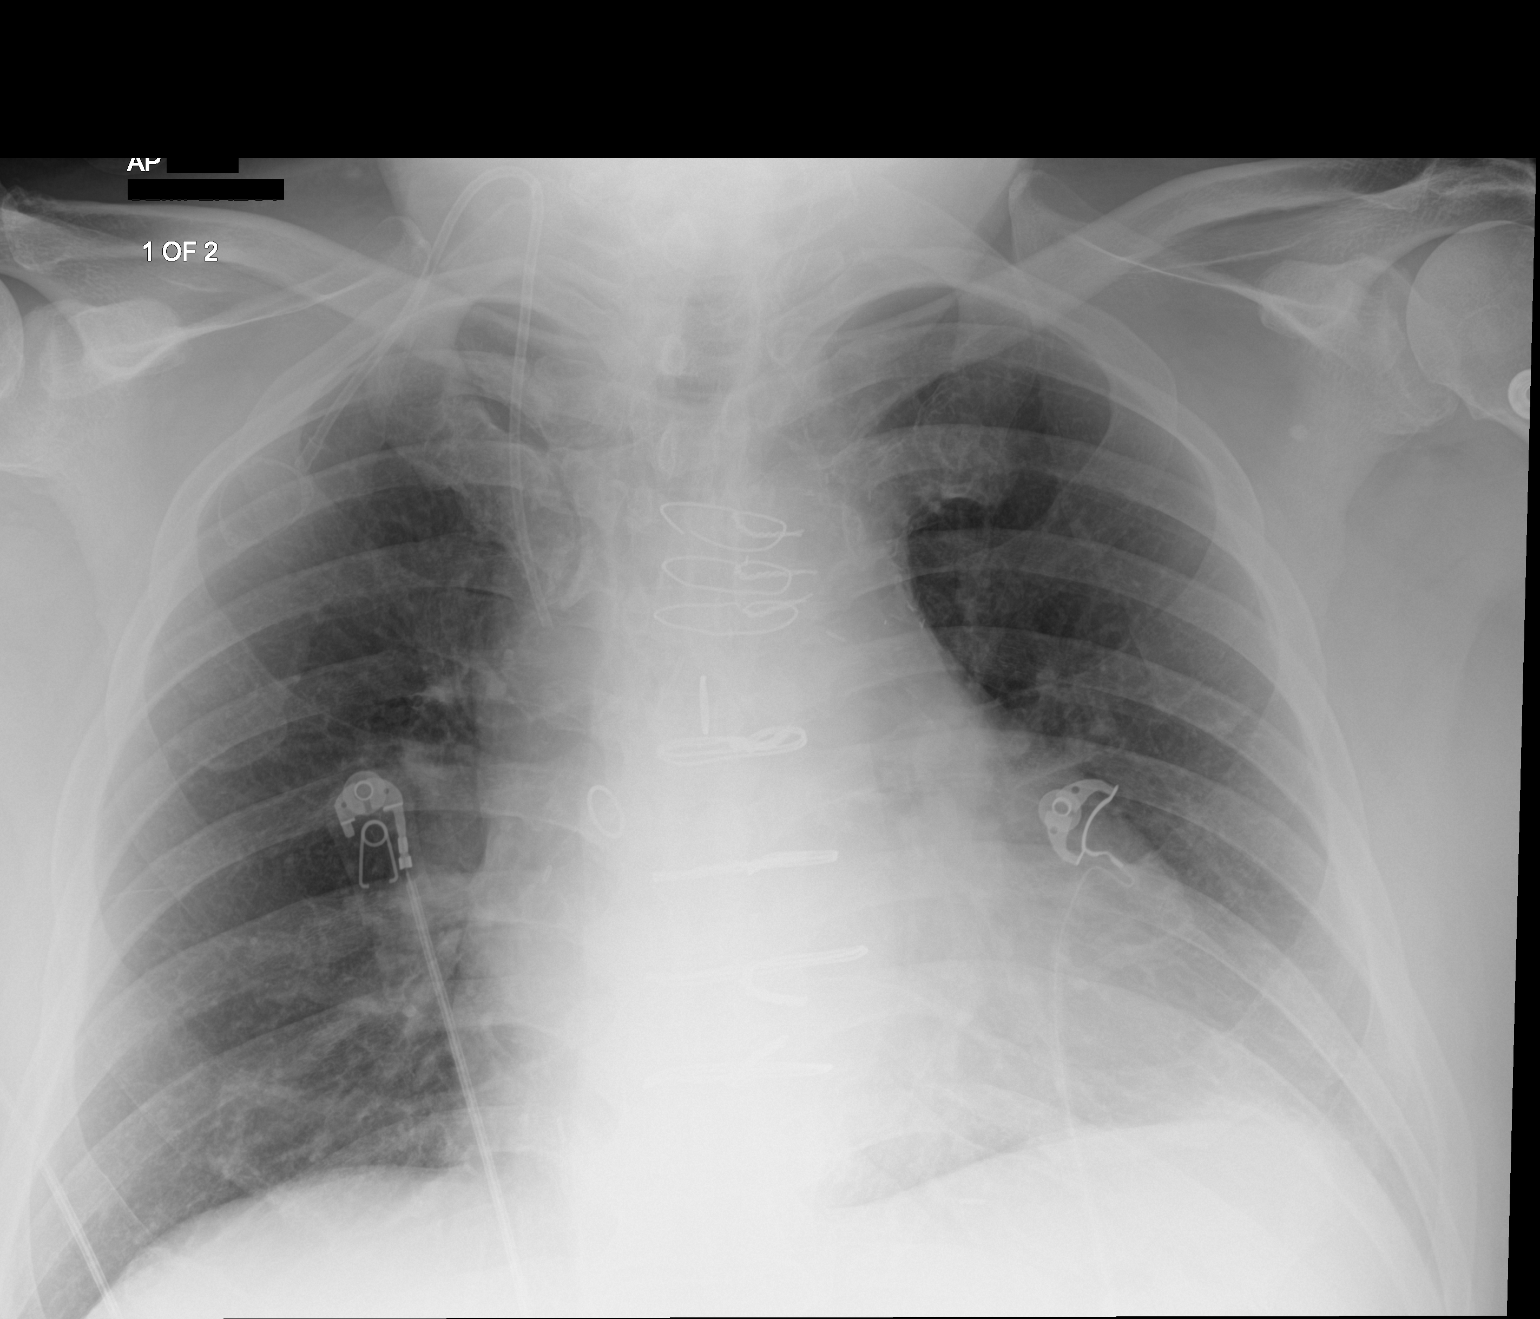

[chest ap (2 of 2)]
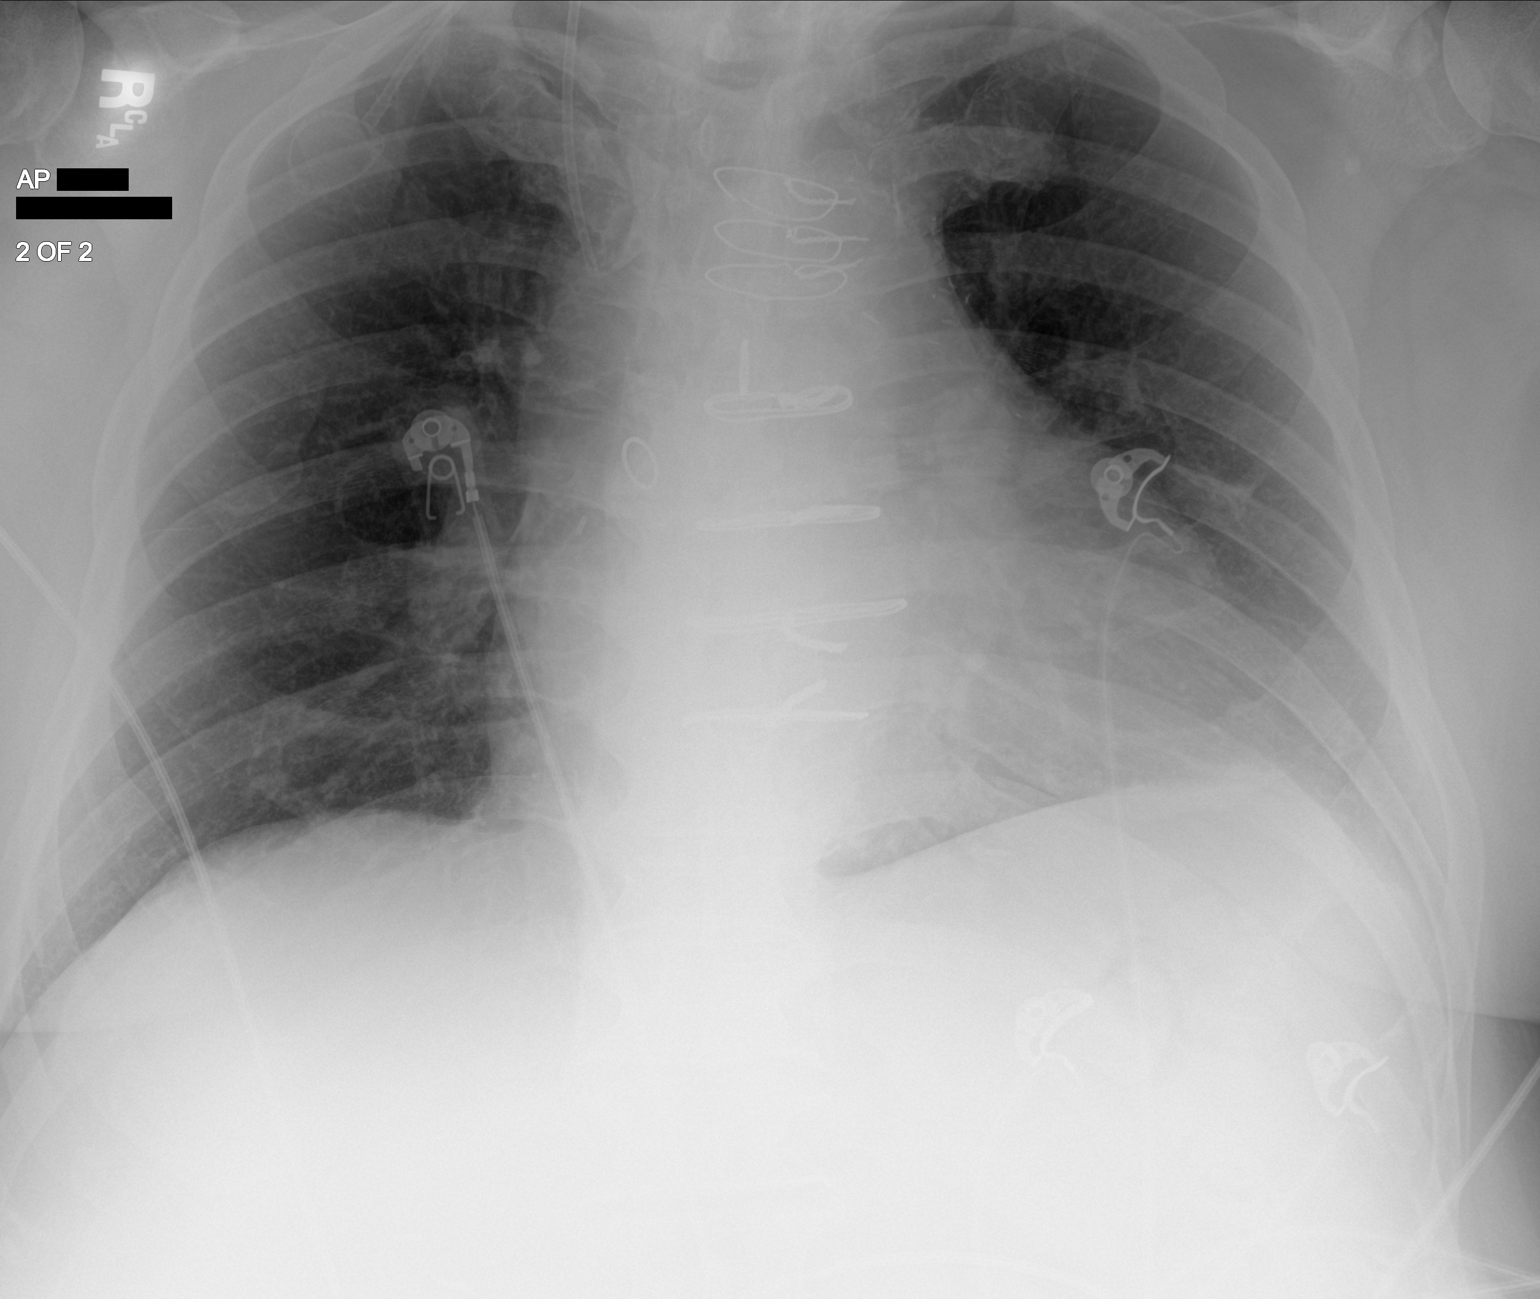

[2 of 2 positions shown; findings below may reference images not displayed]

FINDINGS: Low volumes and atelectatic change without focal consolidative
opacity. No convincing features of edema. No pneumothorax or
effusion. Stable cardiomegaly with postsurgical changes from prior
sternotomy and CABG. Fracture of the superior most sternal suture is
unchanged from comparison exam. A right IJ approach Port-A-Cath tip
terminates in the mid SVC. Telemetry leads overlie the chest. No
acute osseous or soft tissue abnormality. Degenerative changes are
present in the imaged spine and shoulders.
IMPRESSION: 1. Low lung volumes and atelectatic change. No other acute
cardiopulmonary abnormality.
2. Stable cardiomegaly.
3. Prior sternotomy and CABG.

## 2021-03-04 MED ORDER — ONDANSETRON HCL 4 MG PO TABS: 4.0000 mg | ORAL_TABLET | Freq: Four times a day (QID) | ORAL | Status: DC | PRN

## 2021-03-04 MED ORDER — MAGNESIUM OXIDE 400 (241.3 MG) MG PO TABS
400.0000 mg | ORAL_TABLET | Freq: Two times a day (BID) | ORAL | Status: DC
  Administered 2021-03-04 – 2021-03-07 (×6): 400 mg via ORAL
  Filled 2021-03-04 (×6): qty 1

## 2021-03-04 MED ORDER — INSULIN ASPART 100 UNIT/ML ~~LOC~~ SOLN: 0.0000 [IU] | Freq: Every day | SUBCUTANEOUS | Status: DC

## 2021-03-04 MED ORDER — INSULIN GLARGINE 100 UNIT/ML ~~LOC~~ SOLN
35.0000 [IU] | Freq: Every day | SUBCUTANEOUS | Status: DC
  Administered 2021-03-04 – 2021-03-06 (×3): 35 [IU] via SUBCUTANEOUS
  Filled 2021-03-04 (×3): qty 0.35

## 2021-03-04 MED ORDER — PANTOPRAZOLE SODIUM 40 MG PO TBEC
40.0000 mg | DELAYED_RELEASE_TABLET | Freq: Every day | ORAL | Status: DC
  Administered 2021-03-04 – 2021-03-07 (×4): 40 mg via ORAL
  Filled 2021-03-04 (×4): qty 1

## 2021-03-04 MED ORDER — CHLORHEXIDINE GLUCONATE CLOTH 2 % EX PADS
6.0000 | MEDICATED_PAD | Freq: Every day | CUTANEOUS | Status: DC
  Administered 2021-03-04 – 2021-03-07 (×4): 6 via TOPICAL

## 2021-03-04 MED ORDER — MIDODRINE HCL 5 MG PO TABS
5.0000 mg | ORAL_TABLET | Freq: Three times a day (TID) | ORAL | Status: DC
  Administered 2021-03-04 – 2021-03-07 (×9): 5 mg via ORAL
  Filled 2021-03-04 (×10): qty 1

## 2021-03-04 MED ORDER — ENOXAPARIN SODIUM 60 MG/0.6ML ~~LOC~~ SOLN
60.0000 mg | SUBCUTANEOUS | Status: DC
  Filled 2021-03-04: qty 0.6

## 2021-03-04 MED ORDER — METOPROLOL TARTRATE 25 MG PO TABS
25.0000 mg | ORAL_TABLET | Freq: Two times a day (BID) | ORAL | Status: DC
  Administered 2021-03-04: 25 mg via ORAL
  Filled 2021-03-04: qty 1

## 2021-03-04 MED ORDER — ATORVASTATIN CALCIUM 40 MG PO TABS
40.0000 mg | ORAL_TABLET | Freq: Every day | ORAL | Status: DC
  Administered 2021-03-04 – 2021-03-06 (×3): 40 mg via ORAL
  Filled 2021-03-04 (×3): qty 1

## 2021-03-04 MED ORDER — NITROGLYCERIN 0.4 MG SL SUBL: 0.4000 mg | SUBLINGUAL_TABLET | SUBLINGUAL | Status: DC | PRN

## 2021-03-04 MED ORDER — TRAMADOL HCL 50 MG PO TABS
50.0000 mg | ORAL_TABLET | Freq: Once | ORAL | Status: AC
  Administered 2021-03-04: 50 mg via ORAL
  Filled 2021-03-04: qty 1

## 2021-03-04 MED ORDER — ACETAMINOPHEN 325 MG PO TABS
650.0000 mg | ORAL_TABLET | Freq: Four times a day (QID) | ORAL | Status: DC | PRN
  Administered 2021-03-04 – 2021-03-07 (×8): 650 mg via ORAL
  Filled 2021-03-04 (×8): qty 2

## 2021-03-04 MED ORDER — CYCLOSPORINE 0.05 % OP EMUL
1.0000 [drp] | Freq: Two times a day (BID) | OPHTHALMIC | Status: DC
  Administered 2021-03-04 – 2021-03-07 (×4): 1 [drp] via OPHTHALMIC
  Filled 2021-03-04 (×7): qty 1

## 2021-03-04 MED ORDER — SODIUM CHLORIDE 0.9 % IV BOLUS
1000.0000 mL | Freq: Once | INTRAVENOUS | Status: AC
  Administered 2021-03-04: 1000 mL via INTRAVENOUS

## 2021-03-04 MED ORDER — ASPIRIN 81 MG PO CHEW: 81.0000 mg | CHEWABLE_TABLET | Freq: Every day | ORAL | Status: DC

## 2021-03-04 MED ORDER — BOOST / RESOURCE BREEZE PO LIQD CUSTOM
1.0000 | Freq: Three times a day (TID) | ORAL | Status: DC
  Administered 2021-03-04 – 2021-03-07 (×7): 1 via ORAL

## 2021-03-04 MED ORDER — APIXABAN 5 MG PO TABS
5.0000 mg | ORAL_TABLET | Freq: Two times a day (BID) | ORAL | Status: DC
  Administered 2021-03-04 – 2021-03-07 (×7): 5 mg via ORAL
  Filled 2021-03-04 (×7): qty 1

## 2021-03-04 MED ORDER — FLUOXETINE HCL 20 MG PO CAPS
40.0000 mg | ORAL_CAPSULE | Freq: Every day | ORAL | Status: DC
  Administered 2021-03-04 – 2021-03-07 (×4): 40 mg via ORAL
  Filled 2021-03-04 (×4): qty 2

## 2021-03-04 MED ORDER — SODIUM CHLORIDE 0.9% FLUSH
3.0000 mL | Freq: Two times a day (BID) | INTRAVENOUS | Status: DC
  Administered 2021-03-04 – 2021-03-07 (×5): 3 mL via INTRAVENOUS

## 2021-03-04 MED ORDER — ONDANSETRON HCL 4 MG/2ML IJ SOLN
4.0000 mg | Freq: Four times a day (QID) | INTRAMUSCULAR | Status: DC | PRN
  Administered 2021-03-04: 4 mg via INTRAVENOUS
  Filled 2021-03-04: qty 2

## 2021-03-04 MED ORDER — SODIUM CHLORIDE 0.9 % IV BOLUS
500.0000 mL | Freq: Once | INTRAVENOUS | Status: AC
  Administered 2021-03-04: 500 mL via INTRAVENOUS

## 2021-03-04 MED ORDER — POTASSIUM CHLORIDE CRYS ER 20 MEQ PO TBCR
20.0000 meq | EXTENDED_RELEASE_TABLET | Freq: Two times a day (BID) | ORAL | Status: DC
  Administered 2021-03-04 (×2): 20 meq via ORAL
  Filled 2021-03-04 (×2): qty 1

## 2021-03-04 MED ORDER — ADULT MULTIVITAMIN W/MINERALS CH
1.0000 | ORAL_TABLET | Freq: Every day | ORAL | Status: DC
  Administered 2021-03-04 – 2021-03-07 (×4): 1 via ORAL
  Filled 2021-03-04 (×4): qty 1

## 2021-03-04 MED ORDER — INSULIN ASPART 100 UNIT/ML ~~LOC~~ SOLN
0.0000 [IU] | Freq: Three times a day (TID) | SUBCUTANEOUS | Status: DC
  Administered 2021-03-04: 2 [IU] via SUBCUTANEOUS
  Administered 2021-03-04: 3 [IU] via SUBCUTANEOUS
  Administered 2021-03-05: 2 [IU] via SUBCUTANEOUS
  Administered 2021-03-05 – 2021-03-06 (×4): 3 [IU] via SUBCUTANEOUS
  Administered 2021-03-06 – 2021-03-07 (×2): 5 [IU] via SUBCUTANEOUS
  Administered 2021-03-07: 2 [IU] via SUBCUTANEOUS

## 2021-03-04 NOTE — H&P (Signed)
History and Physical    [name] NUU:[id number] DOB: 1946/05/03 DOA: 03/04/2021  PCP: [name], [name] G, MD  Patient coming from: Home  Chief Complaint: "passed out"  HPI: [name] is a 75 y.o. male with medical history significant of CAD s/p CABG, HTN, HLD. Presenting with a syncopal episodes. He states his symptoms started yesterday around noon. He felt some chills and aches. He took a tylenol and laid down. Around 2000hrs, he had a fever. He tempt was 100.5. His family called the ED, and he was advised to come be seen. He declined because he thought he would get over it all. Around 0300hrs this morning, he went to the bathroom. After finishing, he grabbed his walker and started walking down the hallway. He says he began to feel weak, so he thought he would sit down. He states the next thing he remembers is his family standing over him. He states that they told him they heard a loud crash and found him hanging over his walker. They laid him on the ground. It did not appear that he had any head injury. He apparently was out just a few minutes and confused for a few minutes more. His family called for EMS to bring him to the ED.    Of note, he reports feeling a "pinprick" pain in his left chest and fluttering after he regained consciousness. He says it wasn't intense, but it happen 4 times at home and once in the ED. He's not had either of these symptoms before.   ED Course: CTH was negative. CXR was negative. COVID was negative. Trp were mildly elevated. He was found to be in afib. TRH was called for admission. Cardiology was consulted for new afib.   Review of Systems:  Denies dyspnea, N/V/D, ab pain, bowel/bladder incontinence. Reports "pinprick chest pain". Review of systems is otherwise negative for all not mentioned in HPI.   PMHx Past Medical History:  Diagnosis Date  . Anemia   . Arthritis   . [facility])    prostate  . Chronic kidney disease    blood in  urine   . Constipation   . Coronary artery disease   . Depression   . Diabetes mellitus without complication ([place])   . Difficult intubation    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 it was hard to get the tube down his throat  . Fatty liver   . Frequent headaches   . GERD (gastroesophageal reflux disease)   . History of chicken pox   . History of fainting spells of unknown cause   . Hyperlipidemia   . Hypertension   . Myocardial infarction ([place])   . Pneumonia   . PTSD (post-traumatic stress disorder)   . Sleep apnea    uses Cpap    PSHx Past Surgical History:  Procedure Laterality Date  . APPENDECTOMY    . BIOPSY  01/15/2021   Procedure: BIOPSY;  Surgeon: Rush Landmark Telford Nab., MD;  Location: [facility];  Service: Gastroenterology;;  . CARDIAC SURGERY     Triple Bypass  . CHOLECYSTECTOMY  2010  . CORONARY ARTERY BYPASS GRAFT  2004  . ESOPHAGOGASTRODUODENOSCOPY (EGD) WITH PROPOFOL N/A 01/15/2021   Procedure: ESOPHAGOGASTRODUODENOSCOPY (EGD) WITH PROPOFOL;  Surgeon: Rush Landmark Telford Nab., MD;  Location: [facility];  Service: Gastroenterology;  Laterality: N/A;  . EUS N/A 01/15/2021   Procedure: UPPER ENDOSCOPIC ULTRASOUND (EUS) RADIAL;  Surgeon: Rush Landmark Telford Nab., MD;  Location: [facility];  Service: Gastroenterology;  Laterality: N/A;  .  FINE NEEDLE ASPIRATION  01/15/2021   Procedure: FINE NEEDLE ASPIRATION (FNA) LINEAR;  Surgeon: Irving Copas., MD;  Location: [facility];  Service: Gastroenterology;;  . Sol Passer PLACEMENT Right 02/21/2021   Procedure: INSERTION PORT-A-CATH;  Surgeon: Dwan Bolt, MD;  Location: [facility];  Service: General;  Laterality: Right;  . TONSILLECTOMY  1958    SocHx  reports that he quit smoking about 45 years ago. He has never used smokeless tobacco. He reports previous alcohol use. He reports that he does not use drugs.  Allergies  Allergen Reactions  . Furosemide Other (See Comments)    unknown  . Lisinopril Other (See Comments)     Unknown  . Terazosin Other (See Comments)    unknown    FamHx Family History  Problem Relation Age of Onset  . Arthritis Mother   . Cancer Mother   . Heart disease Mother   . Hyperlipidemia Mother   . Hypertension Mother   . Heart attack Mother   . Cancer Sister   . Heart attack Brother   . Heart disease Brother   . Depression Daughter   . Arthritis Sister   . Cancer Brother   . Learning disabilities Brother   . Alcohol abuse Brother   . COPD Brother   . Heart attack Brother   . Heart disease Brother     Prior to Admission medications   Medication Sig Start Date End Date Taking? Authorizing Provider  aspirin 81 MG chewable tablet Chew 81 mg by mouth at bedtime.    [provider]  atorvastatin (LIPITOR) 80 MG tablet Take 40 mg by mouth at bedtime. 09/22/19   [provider]  calcium carbonate (OS-CAL) 600 MG TABS tablet Take 600 mg by mouth 2 (two) times daily.    [provider]  cycloSPORINE (RESTASIS) 0.05 % ophthalmic emulsion Place 1 drop into both eyes 2 (two) times daily.    [provider]  FLUoxetine (PROZAC) 20 MG capsule Take 40 mg by mouth daily. 09/22/19   [provider]  fluticasone Asencion Islam) 50 MCG/ACT nasal spray INSTILL 2 SPRAYS IN EACH NOSTRIL EVERY EVENING 10/31/20   Rozetta Nunnery, MD  hydrochlorothiazide (HYDRODIURIL) 25 MG tablet Take 25 mg by mouth daily.    [provider]  insulin glargine (LANTUS) 100 UNIT/ML injection Inject 0.75 mLs (75 Units total) into the skin daily. Patient taking differently: Inject 75 Units into the skin daily. 10/11/20   [name], [name] G, MD  isosorbide mononitrate (IMDUR) 30 MG 24 hr tablet Take 30 mg by mouth daily.    [provider]  lidocaine-prilocaine (EMLA) cream Apply to port site 1-2 hours prior to use 02/14/21   Owens Shark, NP  losartan (COZAAR) 100 MG tablet Take 100 mg by mouth daily.    [provider]  metoprolol tartrate  (LOPRESSOR) 25 MG tablet Take 25 mg by mouth 2 (two) times daily.    [provider]  Multiple Vitamin (MULTI-VITAMINS) TABS Take 1 tablet by mouth daily.    [provider]  nitroGLYCERIN (NITROSTAT) 0.4 MG SL tablet Place 0.4 mg under the tongue every 5 (five) minutes as needed for chest pain.    [provider]  pantoprazole (PROTONIX) 40 MG tablet Take 1 tablet (40 mg total) by mouth daily. 01/15/21 01/15/22  Mansouraty, Telford Nab., MD  prochlorperazine (COMPAZINE) 10 MG tablet Take 1 tablet (10 mg total) by mouth every 6 (six) hours as needed for nausea or vomiting. 02/14/21  Owens Shark, NP  tamsulosin (FLOMAX) 0.4 MG CAPS capsule Take 0.8 mg by mouth at bedtime.    [provider]  traMADol (ULTRAM) 50 MG tablet Take 1 tablet (50 mg total) by mouth every 6 (six) hours as needed for severe pain. 02/21/21   Dwan Bolt, MD  traZODone (DESYREL) 100 MG tablet Take 100 mg by mouth at bedtime.    [provider]  Wheat Dextrin (BENEFIBER DRINK MIX PO) Take by mouth.    [provider]  Wheat Dextrin (BENEFIBER PO) Take 1 tablet by mouth daily.    [provider]    Physical Exam: Vitals:   03/04/21 0430 03/04/21 0545 03/04/21 0600 03/04/21 0630  BP: 106/70 115/67 122/77 117/63  Pulse: 86 90 92 84  Resp: 14 15 14 14   Temp:      TempSrc:      SpO2: 98% 99% 98% 97%    General: 75 y.o. male resting in bed in NAD Eyes: PERRL, normal sclera ENMT: Nares patent w/o discharge, orophaynx clear, dentition normal, ears w/o discharge/lesions/ulcers Neck: Supple, trachea midline Cardiovascular: RRR, +S1, S2, no m/g/r, equal pulses throughout Respiratory: CTABL, no w/r/r, normal WOB GI: BS+, NDNT, no masses noted, no organomegaly noted MSK: No e/c/c Skin: No rashes, bruises, ulcerations noted Neuro: A&O x 3, no focal deficits Psyc: Appropriate interaction and affect, calm/cooperative  Labs on Admission: I have personally reviewed  following labs and imaging studies  CBC: Recent Labs  Lab 03/01/21 1228 03/04/21 0434  WBC 6.8 7.7  NEUTROABS 5.0 7.2  HGB 11.1* 10.5*  HCT 32.7* 30.2*  MCV 92.6 92.1  PLT 163 818*   Basic Metabolic Panel: Recent Labs  Lab 03/01/21 1228 03/04/21 0434  NA 136 133*  K 3.5 3.3*  CL 104 102  CO2 26 24  GLUCOSE 181* 138*  BUN 21 19  CREATININE 1.42* 1.17  CALCIUM 8.9 8.2*   GFR: Estimated Creatinine Clearance: 73.3 mL/min (by C-G formula based on SCr of 1.17 mg/dL). Liver Function Tests: Recent Labs  Lab 03/01/21 1228 03/04/21 0434  AST 25 81*  ALT 19 50*  ALKPHOS 69 50  BILITOT 1.0 2.5*  PROT 7.0 6.2*  ALBUMIN 3.6 3.1*   No results for input(s): LIPASE, AMYLASE in the last 168 hours. No results for input(s): AMMONIA in the last 168 hours. Coagulation Profile: No results for input(s): INR, PROTIME in the last 168 hours. Cardiac Enzymes: No results for input(s): CKTOTAL, CKMB, CKMBINDEX, TROPONINI in the last 168 hours. BNP (last 3 results) No results for input(s): PROBNP in the last 8760 hours. HbA1C: No results for input(s): HGBA1C in the last 72 hours. CBG: No results for input(s): GLUCAP in the last 168 hours. Lipid Profile: No results for input(s): CHOL, HDL, LDLCALC, TRIG, CHOLHDL, LDLDIRECT in the last 72 hours. Thyroid Function Tests: No results for input(s): TSH, T4TOTAL, FREET4, T3FREE, THYROIDAB in the last 72 hours. Anemia Panel: No results for input(s): VITAMINB12, FOLATE, FERRITIN, TIBC, IRON, RETICCTPCT in the last 72 hours. Urine analysis: No results found for: COLORURINE, APPEARANCEUR, LABSPEC, PHURINE, GLUCOSEU, HGBUR, BILIRUBINUR, KETONESUR, PROTEINUR, UROBILINOGEN, NITRITE, LEUKOCYTESUR  Radiological Exams on Admission: CT Head Wo Contrast  Result Date: 03/04/2021 CLINICAL DATA:  75 year old male status post 1st treatment for pancreatic cancer 3 days ago. Syncope, loss of consciousness. EXAM: CT HEAD WITHOUT CONTRAST TECHNIQUE:  Contiguous axial images were obtained from the base of the skull through the vertex without intravenous contrast. COMPARISON:  None. FINDINGS: Brain: Cerebral volume  is within normal limits for age. No midline shift, mass effect, or evidence of intracranial mass lesion. No ventriculomegaly. Patchy bilateral white matter hypodensity. Deep gray matter nuclei relatively spared. No acute or chronic cortical infarct identified. No acute intracranial hemorrhage identified. Vascular: Calcified atherosclerosis at the skull base. No suspicious intracranial vascular hyperdensity. Skull: No acute or suspicious osseous lesion. Sinuses/Orbits: Visualized paranasal sinuses and mastoids are clear. Other: No acute orbit or scalp soft tissue finding. Postoperative changes to both globes. IMPRESSION: Moderate for age cerebral white matter changes most commonly due to small vessel disease. No acute or metastatic intracranial abnormality identified on this noncontrast exam. Electronically Signed   By: Genevie Ann M.D.   On: 03/04/2021 05:40   DG Chest Portable 1 View  Result Date: 03/04/2021 CLINICAL DATA:  Fever following first chemotherapy for pancreatic cancer EXAM: PORTABLE CHEST 1 VIEW COMPARISON:  Radiograph 10/12/2020 FINDINGS: Low volumes and atelectatic change without focal consolidative opacity. No convincing features of edema. No pneumothorax or effusion. Stable cardiomegaly with postsurgical changes from prior sternotomy and CABG. Fracture of the superior most sternal suture is unchanged from comparison exam. A right IJ approach Port-A-Cath tip terminates in the mid SVC. Telemetry leads overlie the chest. No acute osseous or soft tissue abnormality. Degenerative changes are present in the imaged spine and shoulders. IMPRESSION: 1. Low lung volumes and atelectatic change. No other acute cardiopulmonary abnormality. 2. Stable cardiomegaly. 3. Prior sternotomy and CABG. Electronically Signed   By: Lovena Le M.D.   On:  03/04/2021 05:20    EKG: Independently reviewed. A fib  Assessment/Plan Syncopal episode     - place in obs, tele     - check echo, d-dimer, orthostatics     - he's afebrile here, but reports one at home; UA pending, COVID negative, CXR negative; hold on abx for now     - depended orthostatics, we can resume his metoprolol; but hold other BP meds for right now  CAD s/p CABG New afib Elevated trp Atypical CP     - in the setting of syncope; cardiology to see     - we can resume his metoprolol dependent on orthostatic results     - checking d-dimer     - he's had this "pinprick, needle in the chest" pain this morning; it's non-reproducible  DM2     - lantus, SSI, DM2 diet, A1c  HLD     - continue statin  OSA     - continue CPAP at night  DVT prophylaxis: lovenox  Code Status: FULL  Family Communication: None at bedside.  Consults called: EDP consulted cardiology   Status is: Observation  The patient remains OBS appropriate and will d/c before 2 midnights.  Dispo: The patient is from: Home              Anticipated d/c is to: Home              Patient currently is not medically stable to d/c.   Difficult to place patient No   Jonnie Finner DO [facility]  If 7PM-7AM, please contact night-coverage [email or web address]  03/04/2021, 7:53 AM

## 2021-03-04 NOTE — Consult Note (Addendum)
Cardiology Consultation:   Patient ID: [name] MRN: [id number]; DOB: February 17, 1946  Admit date: 03/04/2021 Date of Consult: 03/04/2021  PCP:  [name], [name] G, [place]  Cardiologist:  Peter [name], MD  Advanced Practice Provider:  No care team member to display Electrophysiologist:  None:   Patient Profile:   [name] is a 75 y.o. male with a PMH of CAD s/p CABG in 2004, HTN, HLD, DM type 2, CKD stage 3a, OSA on CPAP, GERD, and recently diagnosed pancreatic cancer 12/2020 who is being seen today for the evaluation of new onset atrial fibrillation and syncope at the request of Dr. Marylyn Ishihara.  History of Present Illness:   [name] underwent his first chemotherapy infusion 03/01/21 for management of his pancreatic cancer. He felt well 03/02/21, however around noon 03/03/21 he began to feel generalized weakness, aches, and chills, reporting he spent most of the day in bed. He noted Tmax 101F at home which improved with tylenol. He was advised to go to the ED for evaluation, however decided to wait it out given improvement with tylenol. He had a poor appetite that day and only had yogurt for dinner. In the early morning hours of 03/04/21 he reported going to the bathroom with the assistance of his walker. He urinated, and upon returning to bed felt profound weakness in his legs prompting him to sit down. The next thing he knew, he awoke with family standing over him after reportedly being lowered to the ground. He was apparently found slumping over his walker (conflicting stories regarding whether head strike occurred). He was reportedly unconscious for a few minutes, then felt to be back to baseline after a few minutes of regaining consciousness. He reported feeling a pinprick sensation in his left chest shortly after his syncopal event but denied any palpitations. Family activated EMS and patient was brought to the ED for further evaluation.   He  was last evaluated by cardiology at an outpatient evaluation with Dr. [name] 02/27/21, at which time was doing okay from a cardiac standpoint, though recently underwent Port-A-cath placement in anticipation of starting neoadjuvant chemotherapy for recently diagnosed pancreatic cancer - anticipating 4 months of chemo followed by surgery. He was noted to have history of falls due to leg weakness. He was without anginal complaints at that time. He was cleared to undergo pancreatic surgery from a cardiac standpoint at that visit. His last Cardiac cath in July 2018 for evaluation of chest pain showed 90% proximal LAD, occluded nondominant LCx and occluded RCA. The grafts were patent including LIMA to the LAD, SVG to RCA and SVG to ramus with 40-50% proximal stenosis.  At the time of this evaluation he feels back to baseline, though mouth is quite dry. He denies any recent anginal complaints. He has baseline DOE which is unchanged in recent weeks. He reports occasional swelling in RLE, though this is unchanged in recent weeks. He reports occasional dizziness which he thinks may be related to his blood pressure which often runs low, though he does not monitor his BP closely. He reports he will sit down when symptoms of dizziness/lightheadedness occur and they usually resolve quickly. He has not had any recent syncopal events. He denies any recent palpitations, fluttering in his chest, or racing heart beat sensations. His only new medications ar the chemotherapy agents he received 3/18 (gemzar and abraxane). He denies any recent colds. He denies hematuria, hematochezia, or melena.  Hospital course: vitals stable.  Labs notable for Na 133, K 3.3, Cr 1.17 (better than baseline), mild elevation of AST/ALT and Tbili, Hgb 10.5, PLT 104 (down from 163 03/01/21), Hstrop 44>40. EKG showed atrial fibrillation, rate 86 bpm, non-specific T wave abnormalities, no STE/D; Afib is new compared to previous. CXR showed no acute findings  with stable cardiomegaly. CT Head showed small vessel disease, though no acute findings. Echo ordered. He was given IVFs in the ED and home medications continued. Cardiology asked to evaluate for new onset atrial fibrillation and syncope.  Past Medical History:  Diagnosis Date   Anemia    Arthritis    Cancer ([place])    prostate   Chronic kidney disease    blood in urine    Constipation    Coronary artery disease    Depression    Diabetes mellitus without complication ([place])    Difficult intubation    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 it was hard to get the tube down his throat   Fatty liver    Frequent headaches    GERD (gastroesophageal reflux disease)    History of chicken pox    History of fainting spells of unknown cause    Hyperlipidemia    Hypertension    Myocardial infarction ([place])    Pneumonia    PTSD (post-traumatic stress disorder)    Sleep apnea    uses Cpap    Past Surgical History:  Procedure Laterality Date   APPENDECTOMY     BIOPSY  01/15/2021   Procedure: BIOPSY;  Surgeon: Irving Copas., MD;  Location: [facility];  Service: Gastroenterology;;   CARDIAC SURGERY     Triple Bypass   CHOLECYSTECTOMY  2010   CORONARY ARTERY BYPASS GRAFT  2004   ESOPHAGOGASTRODUODENOSCOPY (EGD) WITH PROPOFOL N/A 01/15/2021   Procedure: ESOPHAGOGASTRODUODENOSCOPY (EGD) WITH PROPOFOL;  Surgeon: Irving Copas., MD;  Location: [facility];  Service: Gastroenterology;  Laterality: N/A;   EUS N/A 01/15/2021   Procedure: UPPER ENDOSCOPIC ULTRASOUND (EUS) RADIAL;  Surgeon: Irving Copas., MD;  Location: [facility];  Service: Gastroenterology;  Laterality: N/A;   FINE NEEDLE ASPIRATION  01/15/2021   Procedure: FINE NEEDLE ASPIRATION (FNA) LINEAR;  Surgeon: Irving Copas., MD;  Location: [facility];  Service: Gastroenterology;;   PORTACATH PLACEMENT Right 02/21/2021   Procedure: INSERTION PORT-A-CATH;  Surgeon: Dwan Bolt, MD;   Location: [facility];  Service: General;  Laterality: Right;   TONSILLECTOMY  1958     Home Medications:  Prior to Admission medications   Medication Sig Start Date End Date Taking? Authorizing Provider  aspirin 81 MG chewable tablet Chew 81 mg by mouth at bedtime.   Yes [provider]  atorvastatin (LIPITOR) 80 MG tablet Take 40 mg by mouth at bedtime. 09/22/19  Yes [provider]  calcium carbonate (OS-CAL) 600 MG TABS tablet Take 600 mg by mouth 2 (two) times daily.   Yes [provider]  cycloSPORINE (RESTASIS) 0.05 % ophthalmic emulsion Place 1 drop into both eyes 2 (two) times daily.   Yes [provider]  FLUoxetine (PROZAC) 20 MG capsule Take 40 mg by mouth daily. 09/22/19  Yes [provider]  fluticasone (FLONASE) 50 MCG/ACT nasal spray INSTILL 2 SPRAYS IN EACH NOSTRIL EVERY EVENING Patient taking differently: Place 2 sprays into both nostrils daily as needed for allergies. 10/31/20  Yes Rozetta Nunnery, MD  hydrochlorothiazide (HYDRODIURIL) 25 MG tablet Take 25 mg by mouth daily.   Yes [provider]  insulin glargine (LANTUS) 100  UNIT/ML injection Inject 0.75 mLs (75 Units total) into the skin daily. Patient taking differently: Inject 75 Units into the skin daily. 10/11/20  Yes [name], [name] G, MD  isosorbide mononitrate (IMDUR) 30 MG 24 hr tablet Take 30 mg by mouth daily.   Yes [provider]  lidocaine-prilocaine (EMLA) cream Apply to port site 1-2 hours prior to use Patient taking differently: Apply 1 application topically as needed (port access). 02/14/21  Yes Owens Shark, NP  losartan (COZAAR) 100 MG tablet Take 100 mg by mouth daily.   Yes [provider]  metoprolol tartrate (LOPRESSOR) 25 MG tablet Take 25 mg by mouth 2 (two) times daily.   Yes [provider]  Multiple Vitamin (MULTI-VITAMINS) TABS Take 1 tablet by mouth daily.   Yes [provider]  nitroGLYCERIN (NITROSTAT)  0.4 MG SL tablet Place 0.4 mg under the tongue every 5 (five) minutes as needed for chest pain.   Yes [provider]  pantoprazole (PROTONIX) 40 MG tablet Take 1 tablet (40 mg total) by mouth daily. 01/15/21 01/15/22 Yes Mansouraty, Telford Nab., MD  prochlorperazine (COMPAZINE) 10 MG tablet Take 1 tablet (10 mg total) by mouth every 6 (six) hours as needed for nausea or vomiting. 02/14/21  Yes Owens Shark, NP  traMADol (ULTRAM) 50 MG tablet Take 1 tablet (50 mg total) by mouth every 6 (six) hours as needed for severe pain. 02/21/21  Yes Dwan Bolt, MD  traZODone (DESYREL) 100 MG tablet Take 100 mg by mouth at bedtime.   Yes [provider]  Wheat Dextrin (BENEFIBER PO) Take 1 tablet by mouth daily.   Yes [provider]    Inpatient Medications: Scheduled Meds:  aspirin  81 mg Oral QHS   atorvastatin  40 mg Oral QHS   cycloSPORINE  1 drop Both Eyes BID   enoxaparin (LOVENOX) injection  60 mg Subcutaneous Q24H   FLUoxetine  40 mg Oral Daily   insulin aspart  0-15 Units Subcutaneous TID WC   insulin aspart  0-5 Units Subcutaneous QHS   insulin glargine  35 Units Subcutaneous QHS   metoprolol tartrate  25 mg Oral BID   multivitamin with minerals  1 tablet Oral Daily   pantoprazole  40 mg Oral Daily   potassium chloride  20 mEq Oral BID   sodium chloride flush  3 mL Intravenous Q12H   Continuous Infusions:  PRN Meds: acetaminophen, nitroGLYCERIN, ondansetron **OR** ondansetron (ZOFRAN) IV  Allergies:    Allergies  Allergen Reactions   Furosemide Other (See Comments)    unknown   Lisinopril Other (See Comments)    Unknown   Terazosin Other (See Comments)    unknown    Social History:   Social History   Socioeconomic History   Marital status: Married    Spouse name: Not on file   Number of children: 2   Years of education: Not on file   Highest education level: Not on file  Occupational History   Occupation: retired   Tobacco Use   Smoking status: Former Smoker    Quit date: 12/14/1975    Years since quitting: 45.2   Smokeless tobacco: Never Used  Scientific laboratory technician Use: Not on file  Substance and Sexual Activity   Alcohol use: Not Currently   Drug use: Never   Sexual activity: Not Currently  Other Topics Concern   Not on file  Social History Narrative   ** Merged History Encounter **  Social Determinants of Health   Financial Resource Strain: Not on file  Food Insecurity: Not on file  Transportation Needs: Not on file  Physical Activity: Not on file  Stress: Not on file  Social Connections: Not on file  Intimate Partner Violence: Not on file    Family History:    Family History  Problem Relation Age of Onset   Arthritis Mother    Cancer Mother    Heart disease Mother    Hyperlipidemia Mother    Hypertension Mother    Heart attack Mother    Cancer Sister    Heart attack Brother    Heart disease Brother    Depression Daughter    Arthritis Sister    Cancer Brother    Learning disabilities Brother    Alcohol abuse Brother    COPD Brother    Heart attack Brother    Heart disease Brother      ROS:  Please see the history of present illness.   All other ROS reviewed and negative.     Physical Exam/Data:   Vitals:   03/04/21 0545 03/04/21 0600 03/04/21 0630 03/04/21 1009  BP: 115/67 122/77 117/63 121/68  Pulse: 90 92 84 72  Resp: 15 14 14 19   Temp:    (!) 97.2 F (36.2 C)  TempSrc:    Oral  SpO2: 99% 98% 97% 100%  Height:    5\' 11"  (1.803 m)   No intake or output data in the 24 hours ending 03/04/21 1109 Last 3 Weights 02/27/2021 02/21/2021 02/14/2021  Weight (lbs) 283 lb 276 lb 280 lb 11.2 oz  Weight (kg) 128.368 kg 125.193 kg 127.325 kg     Body mass index is 39.47 kg/m.  General:  Well nourished, well developed, in no acute distress HEENT: sclera anicteric Neck: no JVD Vascular: No carotid bruits; distal pulses 2+ bilaterally;  port-a-cath in right chest wall with ecchymosis surrounding site Cardiac:  normal S1, S2; RRR; no murmurs, rubs, or gallops Lungs:  clear to auscultation bilaterally, no wheezing, rhonchi or rales  Abd: soft, obese, nontender, no hepatomegaly  Ext: no edema Musculoskeletal:  No deformities, BUE and BLE strength normal and equal Skin: warm and dry  Neuro:  CNs 2-12 intact, no focal abnormalities noted Psych:  Normal affect   EKG:  The EKG was personally reviewed and demonstrates:  atrial fibrillation, rate 86 bpm, non-specific T wave abnormalities, no STE/D; Afib is new compared to previous.  Telemetry:  Telemetry was personally reviewed and demonstrates:  Initially with atrial fibrillation with CVR, now with sinus rhythm with occasional PACs/PVCs  Relevant CV Studies: [facility] 2018: Coronary Angiography:   LMCA: 25-30% occlusion at the bifurcation   LAD: 90% occlusion in the proximal segment.   LCX: Nondominant with total occlusion in the proximal segment. Ramus  artery has a 75% occlusion in the proximal segment.    RCA: Dominant and totally occluded in the proximal segment.    GRAFTS: 3 grafts identified. LIMA graft to the LAD is patent and the  distal LAD fills the distal circumflex artery. Vein graft to the ramus  artery has a 40-50% occlusion in the proximal segment immediately distal  to the ostium. Vein graft to the PDA is patent.    Left Ventriculogram:  Overall normal left ventricular systolic function but with minimal  inferior hypokinesis, ejection fraction 60%. LVEDP 18 mmHg.No evidence of  mitral regurgitation or aortic stenosis.    Laboratory Data:  High Sensitivity Troponin:   Recent  Labs  Lab 03/04/21 0434 03/04/21 0615  TROPONINIHS 44* 40*     Chemistry Recent Labs  Lab 03/01/21 1228 03/04/21 0434  NA 136 133*  K 3.5 3.3*  CL 104 102  CO2 26 24  GLUCOSE 181* 138*  BUN 21 19  CREATININE 1.42* 1.17  CALCIUM 8.9 8.2*  GFRNONAA 51* >60  ANIONGAP  6 7    Recent Labs  Lab 03/01/21 1228 03/04/21 0434  PROT 7.0 6.2*  ALBUMIN 3.6 3.1*  AST 25 81*  ALT 19 50*  ALKPHOS 69 50  BILITOT 1.0 2.5*   Hematology Recent Labs  Lab 03/01/21 1228 03/04/21 0434  WBC 6.8 7.7  RBC 3.53* 3.28*  HGB 11.1* 10.5*  HCT 32.7* 30.2*  MCV 92.6 92.1  MCH 31.4 32.0  MCHC 33.9 34.8  RDW 14.9 14.4  PLT 163 104*   BNPNo results for input(s): BNP, PROBNP in the last 168 hours.  DDimer No results for input(s): DDIMER in the last 168 hours.   Radiology/Studies:  CT Head Wo Contrast  Result Date: 03/04/2021 CLINICAL DATA:  75 year old male status post 1st treatment for pancreatic cancer 3 days ago. Syncope, loss of consciousness. EXAM: CT HEAD WITHOUT CONTRAST TECHNIQUE: Contiguous axial images were obtained from the base of the skull through the vertex without intravenous contrast. COMPARISON:  None. FINDINGS: Brain: Cerebral volume is within normal limits for age. No midline shift, mass effect, or evidence of intracranial mass lesion. No ventriculomegaly. Patchy bilateral white matter hypodensity. Deep gray matter nuclei relatively spared. No acute or chronic cortical infarct identified. No acute intracranial hemorrhage identified. Vascular: Calcified atherosclerosis at the skull base. No suspicious intracranial vascular hyperdensity. Skull: No acute or suspicious osseous lesion. Sinuses/Orbits: Visualized paranasal sinuses and mastoids are clear. Other: No acute orbit or scalp soft tissue finding. Postoperative changes to both globes. IMPRESSION: Moderate for age cerebral white matter changes most commonly due to small vessel disease. No acute or metastatic intracranial abnormality identified on this noncontrast exam. Electronically Signed   By: Genevie Ann M.D.   On: 03/04/2021 05:40   DG Chest Portable 1 View  Result Date: 03/04/2021 CLINICAL DATA:  Fever following first chemotherapy for pancreatic cancer EXAM: PORTABLE CHEST 1 VIEW COMPARISON:  Radiograph  10/12/2020 FINDINGS: Low volumes and atelectatic change without focal consolidative opacity. No convincing features of edema. No pneumothorax or effusion. Stable cardiomegaly with postsurgical changes from prior sternotomy and CABG. Fracture of the superior most sternal suture is unchanged from comparison exam. A right IJ approach Port-A-Cath tip terminates in the mid SVC. Telemetry leads overlie the chest. No acute osseous or soft tissue abnormality. Degenerative changes are present in the imaged spine and shoulders. IMPRESSION: 1. Low lung volumes and atelectatic change. No other acute cardiopulmonary abnormality. 2. Stable cardiomegaly. 3. Prior sternotomy and CABG. Electronically Signed   By: Lovena Le M.D.   On: 03/04/2021 05:20     Assessment and Plan:   1. New onset paroxysmal atrial fibrillation: patient presented following a syncopal episode and was found to be in atrial fibrillation with CVR on EKG/telemetry. No prior history of Afib. He noticed some palpitations following his syncopal episode. HsTrop 44>40. No ischemic changes on EKG. He did receive his first chemotherapy 03/01/21 for management of recently diagnosed pancreatic cancer and felt poorly the day prior to admission with fever 101F, weakness, and aches/chills. There was questionable head strike with syncope but CT Head snowed no acute findings. Hgb 10.5 on admission, with baseline in the 11s,  though PLT count has dipped to 104 today from 163 03/01/21. He appears to be back in sinus rhythm at this time. - CHA2DS2-VASc Score = 5 [CHF History: No, HTN History: Yes, Diabetes History: Yes, Stroke History: No, Vascular Disease History: Yes, Age Score: 2, Gender Score: 0].  Therefore, the patient's annual risk of stroke is 7.2 %.    - He would benefit from starting anticoagulation for stroke ppx - recommend apixaban 5mg  BID, though will need very close monitoring of Hgb/PLT count, especially with ongoing chemotherapy.  - Thankfully rates are  reasonably well controlled on home metoprolol tartrate 25mg  BID.  - Will follow-up echocardiogram results to determine further management/work-up  2. Syncope: patient experienced a syncopal episode shortly after urinating in the early morning hours of 03/04/21. Felt weakness, dizziness, lightheadedness prior to syncope, then fleeting pinprick chest pain after regaining consciousness. He had first chemo infusion 03/01/21 and felt poorly 03/03/21 with fever of 101F, poor po intake, and generalized weakness. He describes occasional orthostatic symptoms at baseline though no recent syncope prior to today. Possible he had orthostatic vs micturition syncope, though cannot exclude #1 as contributor.  - Will follow-up echocardiogram results - Will check orthostatic vitals  - Agree with holding home losartan for now - Continue to monitor on telemetry  3. Elevated troponin in patient with CAD s/p CABG in 2004: possible fleeting chest discomfort immediately following syncope, though no recent anginal complaints. HsTrop 44>40 - low flat trend not c/w ACS. EKG is non-ischemic.  - Will follow-up echocardiogram results - Will stop aspirin at this time given need for anticoagulation - Continue statin - Continue BBlocker and imdur  4. HTN: BP stable this admission. Home losartan on hold. - Continue home metoprolol tartrate and imdur  5. HLD: LDL 55 09/2020; at goal of <70 - Continue atorvastatin - monitor LFTs closely with ongoing chemotherapy. Low threshold to hold statin if LFTs uptrend  6. DM type 2: A1C 6.3 09/2020; at goal of <7 - Continue management per primary team  7. Pancreatic cancer: diagnosed 12/2020 with recent port-a-cath placement and initiation of chemotherapy 03/01/21. Planning for 4 months of chemo, followed by surgical resection. Possible recent treatment contributed to #1/2 - Continue management per primary team    Risk Assessment/Risk Scores:          CHA2DS2-VASc Score = 5  This  indicates a 7.2% annual risk of stroke. The patient's score is based upon: CHF History: No HTN History: Yes Diabetes History: Yes Stroke History: No Vascular Disease History: Yes Age Score: 2 Gender Score: 0         For questions or updates, please contact [place] Please consult [email or web address] for contact info under    Signed, Abigail Butts, PA-C  03/04/2021 11:09 AM  Personally seen and examined. Agree with above.  75 year old male with recent new start chemotherapy for pancreatic cancer who has newly diagnosed atrial fibrillation with normal ventricular response, prior CAD with CABG in 2004, diabetes with hypertension hyperlipidemia.  He experienced an episode of syncope after urinating the other day, felt weak.  Also had other constellation of systemic symptoms following chemotherapy.  He states that he has been dizzy for several years in certain situations.  This is not a new symptom for him.  Orthostatics here were markedly positive with systolic decreasing to 59.  Overall alert, no rales on exam, regular rhythm.  Normal rate.  No murmurs.  Await echocardiogram.  Troponin 44 flat.  Not ACS.  Paroxysmal  atrial fibrillation -Auto converted -Recommend Eliquis 5 mg twice a day.  Syncope -With his markedly abnormal orthostatics, this is the reason for his weakness/syncope. -We will stop low-dose metoprolol.  Avoid other antihypertensives. -I will start midodrine 5 mg p.o. 3 times daily -Continue with good p.o.  Elevated troponin -Flat, demand ischemia in the setting of cancer/underlying CAD.  Candee Furbish, MD

## 2021-03-04 NOTE — Discharge Instructions (Signed)

## 2021-03-04 NOTE — ED Provider Notes (Signed)
[facility] DEPT Provider Note   CSN: [id number] Arrival date & time: 03/04/21  0998     History Chief Complaint  Patient presents with  . Loss of Consciousness    [name] is a 75 y.o. male.  Patient with history of CAD status post CABG, diabetes, hypertension, hyperlipidemia, recent diagnosis of pancreatic cancer here with syncopal episode.  States he got his first chemotherapy treatment on March 18.  He felt well on March 19.  Around noon on March 20 he began to feel aches and chills and generalized weakness.  He spent most of the day in bed.  Reports having a temperature at home of 100.5 or 101.  He called his oncologist and was told to come to the ED but he did not at that time because his temperature came down with Tylenol and he began to feel better. This morning while trying to go to the bathroom with his walker he felt weakness in his legs and got lightheaded and dizzy.  He apparently fell backwards and struck his head.  Does not remember falling.  He felt no room spinning dizziness or lightheadedness.  No nausea, vomiting, chest pain or shortness of breath.  No diaphoresis.  No diarrhea.  No abdominal pain. Complains of pain in his feet currently which is chronic.  Is not certain if he hit his head.  He denies any pain in his neck or back.  No abdominal pain.  No chest pain or shortness of breath. He saw his cardiologist last week and was given a reassuring evaluation.  The history is provided by the patient and the EMS personnel.  Loss of Consciousness Associated symptoms: fever, headaches and weakness   Associated symptoms: no chest pain, no nausea, no shortness of breath and no vomiting        Past Medical History:  Diagnosis Date  . Anemia   . Arthritis   . Cancer [facility])    prostate  . Chronic kidney disease    blood in urine   . Constipation   . Coronary artery disease   . Depression   . Diabetes mellitus without  complication ([place])   . Difficult intubation    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 it was hard to get the tube down his throat  . Fatty liver   . Frequent headaches   . GERD (gastroesophageal reflux disease)   . History of chicken pox   . History of fainting spells of unknown cause   . Hyperlipidemia   . Hypertension   . Myocardial infarction ([place])   . Pneumonia   . PTSD (post-traumatic stress disorder)   . Sleep apnea    uses Cpap    Patient Active Problem List   Diagnosis Date Noted  . Cancer of head of pancreas ([place]) 01/31/2021  . Chest pain of uncertain etiology 33/82/5053  . Elevated lipase 10/17/2020  . H/O agent Orange exposure 10/17/2020  . Dyslipidemia, goal LDL below 70 10/17/2020  . Hx of CABG 02/28/2020  . DM (diabetes mellitus), type 2 with renal complications ([place]) 97/67/3419  . Hypertension with heart disease 04/01/2019  . 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[place]) 04/01/2019  . CKD (chronic kidney disease), stage III ([place]) 04/01/2019  . Peripheral neuropathy 04/01/2019  . Insomnia 11/30/2018    Past Surgical History:  Procedure Laterality Date  . APPENDECTOMY    . BIOPSY  01/15/2021   Procedure: BIOPSY;  Surgeon: Mansouraty, Telford Nab., MD;  Location: [facility];  Service: Gastroenterology;;  . CARDIAC SURGERY     Triple Bypass  . CHOLECYSTECTOMY  2010  . CORONARY ARTERY BYPASS GRAFT  2004  . ESOPHAGOGASTRODUODENOSCOPY (EGD) WITH PROPOFOL N/A 01/15/2021   Procedure: ESOPHAGOGASTRODUODENOSCOPY (EGD) WITH PROPOFOL;  Surgeon: Rush Landmark Telford Nab., MD;  Location: [place];  Service: Gastroenterology;  Laterality: N/A;  . EUS N/A 01/15/2021   Procedure: UPPER ENDOSCOPIC ULTRASOUND (EUS) RADIAL;  Surgeon: Rush Landmark Telford Nab., MD;  Location: [facility];  Service: Gastroenterology;  Laterality: N/A;  . FINE NEEDLE ASPIRATION  01/15/2021   Procedure: FINE NEEDLE ASPIRATION (FNA) LINEAR;  Surgeon: Irving Copas., MD;  Location: [facility];  Service:  Gastroenterology;;  . Sol Passer PLACEMENT Right 02/21/2021   Procedure: INSERTION PORT-A-CATH;  Surgeon: Dwan Bolt, MD;  Location: [facility];  Service: General;  Laterality: Right;  . TONSILLECTOMY  1958       Family History  Problem Relation Age of Onset  . Arthritis Mother   . Cancer Mother   . Heart disease Mother   . Hyperlipidemia Mother   . Hypertension Mother   . Heart attack Mother   . Cancer Sister   . Heart attack Brother   . Heart disease Brother   . Depression Daughter   . Arthritis Sister   . Cancer Brother   . Learning disabilities Brother   . Alcohol abuse Brother   . COPD Brother   . Heart attack Brother   . Heart disease Brother     Social History   Tobacco Use  . Smoking status: Former Smoker    Quit date: 12/14/1975    Years since quitting: 45.2  . Smokeless tobacco: Never Used  Substance Use Topics  . Alcohol use: Not Currently  . Drug use: Never    Home Medications Prior to Admission medications   Medication Sig Start Date End Date Taking? Authorizing Provider  aspirin 81 MG chewable tablet Chew 81 mg by mouth at bedtime.    [provider]  atorvastatin (LIPITOR) 80 MG tablet Take 40 mg by mouth at bedtime. 09/22/19   [provider]  calcium carbonate (OS-CAL) 600 MG TABS tablet Take 600 mg by mouth 2 (two) times daily.    [provider]  cycloSPORINE (RESTASIS) 0.05 % ophthalmic emulsion Place 1 drop into both eyes 2 (two) times daily.    [provider]  FLUoxetine (PROZAC) 20 MG capsule Take 40 mg by mouth daily. 09/22/19   [provider]  fluticasone Asencion Islam) 50 MCG/ACT nasal spray INSTILL 2 SPRAYS IN EACH NOSTRIL EVERY EVENING 10/31/20   Rozetta Nunnery, MD  hydrochlorothiazide (HYDRODIURIL) 25 MG tablet Take 25 mg by mouth daily.    [provider]  insulin glargine (LANTUS) 100 UNIT/ML injection Inject 0.75 mLs (75 Units total) into the skin daily. Patient taking  differently: Inject 75 Units into the skin daily. 10/11/20   [name], [name] G, MD  isosorbide mononitrate (IMDUR) 30 MG 24 hr tablet Take 30 mg by mouth daily.    [provider]  lidocaine-prilocaine (EMLA) cream Apply to port site 1-2 hours prior to use 02/14/21   Owens Shark, NP  losartan (COZAAR) 100 MG tablet Take 100 mg by mouth daily.    [provider]  metoprolol tartrate (LOPRESSOR) 25 MG tablet Take 25 mg by mouth 2 (two) times daily.    [provider]  Multiple Vitamin (MULTI-VITAMINS) TABS Take 1 tablet by mouth daily.    [provider]  nitroGLYCERIN (NITROSTAT)  0.4 MG SL tablet Place 0.4 mg under the tongue every 5 (five) minutes as needed for chest pain.    [provider]  pantoprazole (PROTONIX) 40 MG tablet Take 1 tablet (40 mg total) by mouth daily. 01/15/21 01/15/22  Mansouraty, Telford Nab., MD  prochlorperazine (COMPAZINE) 10 MG tablet Take 1 tablet (10 mg total) by mouth every 6 (six) hours as needed for nausea or vomiting. 02/14/21   Owens Shark, NP  tamsulosin (FLOMAX) 0.4 MG CAPS capsule Take 0.8 mg by mouth at bedtime.    [provider]  traMADol (ULTRAM) 50 MG tablet Take 1 tablet (50 mg total) by mouth every 6 (six) hours as needed for severe pain. 02/21/21   Dwan Bolt, MD  traZODone (DESYREL) 100 MG tablet Take 100 mg by mouth at bedtime.    [provider]  Wheat Dextrin (BENEFIBER DRINK MIX PO) Take by mouth.    [provider]  Wheat Dextrin (BENEFIBER PO) Take 1 tablet by mouth daily.    [provider]    Allergies    Furosemide, Lisinopril, and Terazosin  Review of Systems   Review of Systems  Constitutional: Positive for activity change, appetite change, fatigue and fever.  HENT: Negative for congestion and rhinorrhea.   Respiratory: Negative for cough and shortness of breath.   Cardiovascular: Positive for syncope. Negative for chest pain.  Gastrointestinal: Negative  for abdominal pain, nausea and vomiting.  Genitourinary: Negative for dysuria and hematuria.  Musculoskeletal: Positive for arthralgias and myalgias.  Skin: Negative for rash.  Neurological: Positive for syncope, weakness and headaches.   all other systems are negative except as noted in the HPI and PMH.   Physical Exam Updated Vital Signs BP 112/69   Pulse 79   Temp 98.3 F (36.8 C) (Oral)   Resp 19   SpO2 98%   Physical Exam Vitals and nursing note reviewed.  Constitutional:      General: He is not in acute distress.    Appearance: He is well-developed. He is obese.  HENT:     Head: Normocephalic and atraumatic.     Mouth/Throat:     Mouth: Mucous membranes are dry.     Pharynx: No oropharyngeal exudate.  Eyes:     Conjunctiva/sclera: Conjunctivae normal.     Pupils: Pupils are equal, round, and reactive to light.  Neck:     Comments: No C spine tenderness. Cardiovascular:     Rate and Rhythm: Normal rate. Rhythm irregular.     Heart sounds: Normal heart sounds. No murmur heard.   Pulmonary:     Effort: Pulmonary effort is normal. No respiratory distress.     Breath sounds: Normal breath sounds.  Chest:     Chest wall: No tenderness.  Abdominal:     Palpations: Abdomen is soft.     Tenderness: There is no abdominal tenderness. There is no guarding or rebound.  Musculoskeletal:        General: No tenderness. Normal range of motion.     Cervical back: Normal range of motion and neck supple.     Comments: No T or L spine tenderness  Skin:    General: Skin is warm.     Findings: Rash present.     Comments: Papular erythematous rash involving ulnar side of right forearm and upper arm  Neurological:     Mental Status: He is alert and oriented to person, place, and time.     Cranial Nerves: No cranial  nerve deficit.     Motor: No abnormal muscle tone.     Coordination: Coordination normal.     Comments: No ataxia on finger to nose bilaterally. No pronator drift.  5/5 strength throughout. CN 2-12 intact.Equal grip strength. Sensation intact.   Psychiatric:        Behavior: Behavior normal.     ED Results / Procedures / Treatments   Labs (all labs ordered are listed, but only abnormal results are displayed) Labs Reviewed  CBC WITH DIFFERENTIAL/PLATELET - Abnormal; Notable for the following components:      Result Value   RBC 3.28 (*)    Hemoglobin 10.5 (*)    HCT 30.2 (*)    Platelets 104 (*)    Lymphs Abs 0.4 (*)    All other components within normal limits  COMPREHENSIVE METABOLIC PANEL - Abnormal; Notable for the following components:   Sodium 133 (*)    Potassium 3.3 (*)    Glucose, Bld 138 (*)    Calcium 8.2 (*)    Total Protein 6.2 (*)    Albumin 3.1 (*)    AST 81 (*)    ALT 50 (*)    Total Bilirubin 2.5 (*)    All other components within normal limits  TROPONIN I (HIGH SENSITIVITY) - Abnormal; Notable for the following components:   Troponin I (High Sensitivity) 44 (*)    All other components within normal limits  TROPONIN I (HIGH SENSITIVITY) - Abnormal; Notable for the following components:   Troponin I (High Sensitivity) 40 (*)    All other components within normal limits  RESP PANEL BY RT-PCR (FLU A&B, COVID) ARPGX2  CULTURE, BLOOD (ROUTINE X 2)  CULTURE, BLOOD (ROUTINE X 2)  URINE CULTURE  LACTIC ACID, PLASMA  URINALYSIS, ROUTINE W REFLEX MICROSCOPIC    EKG EKG Interpretation  Date/Time:  Monday March 04 2021 04:42:08 EDT Ventricular Rate:  86 PR Interval:    QRS Duration: 81 QT Interval:  496 QTC Calculation: 594 R Axis:   -32 Text Interpretation: Atrial fibrillation Left axis deviation Consider anterior infarct Borderline repolarization abnormality Prolonged QT interval new atrial fibrillation Confirmed by Ezequiel Essex [phone]) on 03/04/2021 4:46:23 AM   Radiology CT Head Wo Contrast  Result Date: 03/04/2021 CLINICAL DATA:  74 year old male status post 1st treatment for pancreatic cancer 3 days ago.  Syncope, loss of consciousness. EXAM: CT HEAD WITHOUT CONTRAST TECHNIQUE: Contiguous axial images were obtained from the base of the skull through the vertex without intravenous contrast. COMPARISON:  None. FINDINGS: Brain: Cerebral volume is within normal limits for age. No midline shift, mass effect, or evidence of intracranial mass lesion. No ventriculomegaly. Patchy bilateral white matter hypodensity. Deep gray matter nuclei relatively spared. No acute or chronic cortical infarct identified. No acute intracranial hemorrhage identified. Vascular: Calcified atherosclerosis at the skull base. No suspicious intracranial vascular hyperdensity. Skull: No acute or suspicious osseous lesion. Sinuses/Orbits: Visualized paranasal sinuses and mastoids are clear. Other: No acute orbit or scalp soft tissue finding. Postoperative changes to both globes. IMPRESSION: Moderate for age cerebral white matter changes most commonly due to small vessel disease. No acute or metastatic intracranial abnormality identified on this noncontrast exam. Electronically Signed   By: Genevie Ann M.D.   On: 03/04/2021 05:40   DG Chest Portable 1 View  Result Date: 03/04/2021 CLINICAL DATA:  Fever following first chemotherapy for pancreatic cancer EXAM: PORTABLE CHEST 1 VIEW COMPARISON:  Radiograph 10/12/2020 FINDINGS: Low volumes and atelectatic change without focal consolidative opacity. No  convincing features of edema. No pneumothorax or effusion. Stable cardiomegaly with postsurgical changes from prior sternotomy and CABG. Fracture of the superior most sternal suture is unchanged from comparison exam. A right IJ approach Port-A-Cath tip terminates in the mid SVC. Telemetry leads overlie the chest. No acute osseous or soft tissue abnormality. Degenerative changes are present in the imaged spine and shoulders. IMPRESSION: 1. Low lung volumes and atelectatic change. No other acute cardiopulmonary abnormality. 2. Stable cardiomegaly. 3. Prior  sternotomy and CABG. Electronically Signed   By: Lovena Le M.D.   On: 03/04/2021 05:20    Procedures Procedures   Medications Ordered in ED Medications  sodium chloride 0.9 % bolus 1,000 mL (has no administration in time range)    ED Course  I have reviewed the triage vital signs and the nursing notes.  Pertinent labs & imaging results that were available during my care of the patient were reviewed by me and considered in my medical decision making (see chart for details).    MDM Rules/Calculators/A&P                         Patient here with syncopal episode after receiving chemotherapy for the first time 2 days ago.  Did report fever during the day yesterday but not come to the hospital. Overall on arrival with stable vital signs.  He appears dehydrated with dry mucous membranes.  Has atrial fibrillation which is a new diagnosis by his report  Patient hydrated.  Blood cultures obtained.  He is afebrile in the ED. Chest x-ray is negative for infiltrate.  CT head is negative.  He is not neutropenic.  Patient given IV fluids.  He does have a normal ejection fraction.  Creatinine appears to be at baseline.  He denies any pain, cough or fever.  Afebrile since arrival to the ED.  We will hold antibiotics at this point.  Cultures are sent.  He is not neutropenic  With dehydration, syncope and new atrial fibrillation will plan observation admission.  Discussed with Dr. Sidney Ace.  He requests cardiology notification given new atrial fibrillation and positive troponin. Discussed with Mendel Ryder NP of cardiology.  Pearlie Lafosse was evaluated in Emergency Department on 03/04/2021 for the symptoms described in the history of present illness. He was evaluated in the context of the global COVID-19 pandemic, which necessitated consideration that the patient might be at risk for infection with the SARS-CoV-2 virus that causes COVID-19. Institutional protocols and algorithms that pertain to the  evaluation of patients at risk for COVID-19 are in a state of rapid change based on information released by regulatory bodies including the CDC and federal and state organizations. These policies and algorithms were followed during the patient's care in the ED.  Final Clinical Impression(s) / ED Diagnoses Final diagnoses:  None    Rx / DC Orders ED Discharge Orders    None       Rancour, Annie Main, MD 03/04/21 [phone]

## 2021-03-04 NOTE — Progress Notes (Signed)
  Echocardiogram 2D Echocardiogram has been performed.  [name] 03/04/2021, 12:13 PM

## 2021-03-04 NOTE — Progress Notes (Signed)
[facility] for Eliquis Indication: new omset atrial fibrillation  Allergies  Allergen Reactions  . Furosemide Other (See Comments)    unknown  . Lisinopril Other (See Comments)    Unknown  . Terazosin Other (See Comments)    unknown    Patient Measurements: Height: 5\' 11"  (180.3 cm) IBW/kg (Calculated) : 75.3 Heparin Dosing Weight:   Vital Signs: Temp: 97.2 F (36.2 C) (03/21 1009) Temp Source: Oral (03/21 1009) BP: 108/90 (03/21 1227) Pulse Rate: 80 (03/21 1227)  Labs: Recent Labs    03/04/21 0434 03/04/21 0615  HGB 10.5*  --   HCT 30.2*  --   PLT 104*  --   CREATININE 1.17  --   TROPONINIHS 44* 40*    Estimated Creatinine Clearance: 73.3 mL/min (by C-G formula based on SCr of 1.17 mg/dL).   Assessment: Patient is a 75 y.o M with hx CAD and pancreatic cancer on chemotherapy treatment PTA presented to he ED on 3/21 with syncopal episode.  He was found to be in afib.  Pharmacy has been consulted to start Eliquis for noew onset afib.    Plan:  - d/c lovenox 60mg  daily - Eliquis 5 mg bid  - pharmacy will sign off, but will follow patient peripherally along with you  Dia Sitter P 03/04/2021,12:49 PM

## 2021-03-04 NOTE — ED Triage Notes (Signed)
Pt BIB EMS Per EMS pt had his first Tx for pancreatic cancer on Friday 3/18. Pt was at home when he went to the bathroom and felt like he was going to pass out. He sat down and passed out, he woke up on the floor.   Vitals 104/48 70-90s afib hr cbg 154

## 2021-03-05 ENCOUNTER — Telehealth: Payer: Self-pay | Admitting: Cardiology

## 2021-03-05 ENCOUNTER — Observation Stay (HOSPITAL_COMMUNITY): Payer: Medicare Other

## 2021-03-05 ENCOUNTER — Encounter (HOSPITAL_COMMUNITY): Payer: Self-pay | Admitting: Internal Medicine

## 2021-03-05 DIAGNOSIS — M2548 Effusion, other site: Secondary | ICD-10-CM | POA: Diagnosis not present

## 2021-03-05 DIAGNOSIS — Y92239 Unspecified place in hospital as the place of occurrence of the external cause: Secondary | ICD-10-CM | POA: Diagnosis not present

## 2021-03-05 DIAGNOSIS — E785 Hyperlipidemia, unspecified: Secondary | ICD-10-CM | POA: Diagnosis present

## 2021-03-05 DIAGNOSIS — I951 Orthostatic hypotension: Principal | ICD-10-CM

## 2021-03-05 DIAGNOSIS — I10 Essential (primary) hypertension: Secondary | ICD-10-CM

## 2021-03-05 DIAGNOSIS — I517 Cardiomegaly: Secondary | ICD-10-CM | POA: Diagnosis not present

## 2021-03-05 DIAGNOSIS — I4891 Unspecified atrial fibrillation: Secondary | ICD-10-CM | POA: Diagnosis not present

## 2021-03-05 DIAGNOSIS — Z811 Family history of alcohol abuse and dependence: Secondary | ICD-10-CM | POA: Diagnosis not present

## 2021-03-05 DIAGNOSIS — C259 Malignant neoplasm of pancreas, unspecified: Secondary | ICD-10-CM

## 2021-03-05 DIAGNOSIS — D6959 Other secondary thrombocytopenia: Secondary | ICD-10-CM | POA: Diagnosis not present

## 2021-03-05 DIAGNOSIS — Z83438 Family history of other disorder of lipoprotein metabolism and other lipidemia: Secondary | ICD-10-CM | POA: Diagnosis not present

## 2021-03-05 DIAGNOSIS — Z8261 Family history of arthritis: Secondary | ICD-10-CM | POA: Diagnosis not present

## 2021-03-05 DIAGNOSIS — I2510 Atherosclerotic heart disease of native coronary artery without angina pectoris: Secondary | ICD-10-CM | POA: Diagnosis present

## 2021-03-05 DIAGNOSIS — E119 Type 2 diabetes mellitus without complications: Secondary | ICD-10-CM | POA: Diagnosis not present

## 2021-03-05 DIAGNOSIS — M7989 Other specified soft tissue disorders: Secondary | ICD-10-CM

## 2021-03-05 DIAGNOSIS — Z87891 Personal history of nicotine dependence: Secondary | ICD-10-CM | POA: Diagnosis not present

## 2021-03-05 DIAGNOSIS — E1122 Type 2 diabetes mellitus with diabetic chronic kidney disease: Secondary | ICD-10-CM | POA: Diagnosis present

## 2021-03-05 DIAGNOSIS — E871 Hypo-osmolality and hyponatremia: Secondary | ICD-10-CM | POA: Diagnosis present

## 2021-03-05 DIAGNOSIS — I248 Other forms of acute ischemic heart disease: Secondary | ICD-10-CM | POA: Diagnosis present

## 2021-03-05 DIAGNOSIS — R55 Syncope and collapse: Secondary | ICD-10-CM | POA: Diagnosis not present

## 2021-03-05 DIAGNOSIS — J9811 Atelectasis: Secondary | ICD-10-CM | POA: Diagnosis not present

## 2021-03-05 DIAGNOSIS — Z9049 Acquired absence of other specified parts of digestive tract: Secondary | ICD-10-CM | POA: Diagnosis not present

## 2021-03-05 DIAGNOSIS — Z20822 Contact with and (suspected) exposure to covid-19: Secondary | ICD-10-CM | POA: Diagnosis present

## 2021-03-05 DIAGNOSIS — L03113 Cellulitis of right upper limb: Secondary | ICD-10-CM | POA: Diagnosis not present

## 2021-03-05 DIAGNOSIS — L539 Erythematous condition, unspecified: Secondary | ICD-10-CM | POA: Diagnosis not present

## 2021-03-05 DIAGNOSIS — N1831 Chronic kidney disease, stage 3a: Secondary | ICD-10-CM | POA: Diagnosis present

## 2021-03-05 DIAGNOSIS — R509 Fever, unspecified: Secondary | ICD-10-CM | POA: Diagnosis not present

## 2021-03-05 DIAGNOSIS — Z951 Presence of aortocoronary bypass graft: Secondary | ICD-10-CM | POA: Diagnosis not present

## 2021-03-05 DIAGNOSIS — T451X5A Adverse effect of antineoplastic and immunosuppressive drugs, initial encounter: Secondary | ICD-10-CM | POA: Diagnosis not present

## 2021-03-05 DIAGNOSIS — Z888 Allergy status to other drugs, medicaments and biological substances status: Secondary | ICD-10-CM | POA: Diagnosis not present

## 2021-03-05 DIAGNOSIS — Z8249 Family history of ischemic heart disease and other diseases of the circulatory system: Secondary | ICD-10-CM | POA: Diagnosis not present

## 2021-03-05 DIAGNOSIS — D696 Thrombocytopenia, unspecified: Secondary | ICD-10-CM | POA: Diagnosis not present

## 2021-03-05 DIAGNOSIS — E1140 Type 2 diabetes mellitus with diabetic neuropathy, unspecified: Secondary | ICD-10-CM | POA: Diagnosis present

## 2021-03-05 DIAGNOSIS — I252 Old myocardial infarction: Secondary | ICD-10-CM | POA: Diagnosis not present

## 2021-03-05 DIAGNOSIS — D701 Agranulocytosis secondary to cancer chemotherapy: Secondary | ICD-10-CM | POA: Diagnosis not present

## 2021-03-05 DIAGNOSIS — K760 Fatty (change of) liver, not elsewhere classified: Secondary | ICD-10-CM | POA: Diagnosis present

## 2021-03-05 DIAGNOSIS — I480 Paroxysmal atrial fibrillation: Secondary | ICD-10-CM | POA: Diagnosis not present

## 2021-03-05 DIAGNOSIS — K219 Gastro-esophageal reflux disease without esophagitis: Secondary | ICD-10-CM | POA: Diagnosis present

## 2021-03-05 DIAGNOSIS — I129 Hypertensive chronic kidney disease with stage 1 through stage 4 chronic kidney disease, or unspecified chronic kidney disease: Secondary | ICD-10-CM | POA: Diagnosis present

## 2021-03-05 DIAGNOSIS — C250 Malignant neoplasm of head of pancreas: Secondary | ICD-10-CM | POA: Diagnosis not present

## 2021-03-05 LAB — CBC
HCT: 27.4 % — ABNORMAL LOW (ref 39.0–52.0)
Hemoglobin: 9.4 g/dL — ABNORMAL LOW (ref 13.0–17.0)
MCH: 31.9 pg (ref 26.0–34.0)
MCHC: 34.3 g/dL (ref 30.0–36.0)
MCV: 92.9 fL (ref 80.0–100.0)
Platelets: 100 10*3/uL — ABNORMAL LOW (ref 150–400)
RBC: 2.95 MIL/uL — ABNORMAL LOW (ref 4.22–5.81)
RDW: 14.6 % (ref 11.5–15.5)
WBC: 5.8 10*3/uL (ref 4.0–10.5)
nRBC: 0 % (ref 0.0–0.2)

## 2021-03-05 LAB — COMPREHENSIVE METABOLIC PANEL
ALT: 64 U/L — ABNORMAL HIGH (ref 0–44)
AST: 67 U/L — ABNORMAL HIGH (ref 15–41)
Albumin: 3.2 g/dL — ABNORMAL LOW (ref 3.5–5.0)
Alkaline Phosphatase: 55 U/L (ref 38–126)
Anion gap: 7 (ref 5–15)
BUN: 17 mg/dL (ref 8–23)
CO2: 24 mmol/L (ref 22–32)
Calcium: 8.4 mg/dL — ABNORMAL LOW (ref 8.9–10.3)
Chloride: 100 mmol/L (ref 98–111)
Creatinine, Ser: 1.17 mg/dL (ref 0.61–1.24)
GFR, Estimated: >60 mL/min (ref >60)
Glucose, Bld: 121 mg/dL — ABNORMAL HIGH (ref 70–99)
Potassium: 3.2 mmol/L — ABNORMAL LOW (ref 3.5–5.1)
Sodium: 131 mmol/L — ABNORMAL LOW (ref 135–145)
Total Bilirubin: 1.6 mg/dL — ABNORMAL HIGH (ref 0.3–1.2)
Total Protein: 6.4 g/dL — ABNORMAL LOW (ref 6.5–8.1)

## 2021-03-05 LAB — MAGNESIUM: Magnesium: 1.8 mg/dL (ref 1.7–2.4)

## 2021-03-05 LAB — BASIC METABOLIC PANEL
Anion gap: 7 (ref 5–15)
BUN: 18 mg/dL (ref 8–23)
CO2: 24 mmol/L (ref 22–32)
Calcium: 8.2 mg/dL — ABNORMAL LOW (ref 8.9–10.3)
Chloride: 100 mmol/L (ref 98–111)
Creatinine, Ser: 1.19 mg/dL (ref 0.61–1.24)
GFR, Estimated: >60 mL/min (ref >60)
Glucose, Bld: 199 mg/dL — ABNORMAL HIGH (ref 70–99)
Potassium: 3.2 mmol/L — ABNORMAL LOW (ref 3.5–5.1)
Sodium: 131 mmol/L — ABNORMAL LOW (ref 135–145)

## 2021-03-05 LAB — GLUCOSE, CAPILLARY
Glucose-Capillary: 115 mg/dL — ABNORMAL HIGH (ref 70–99)
Glucose-Capillary: 124 mg/dL — ABNORMAL HIGH (ref 70–99)
Glucose-Capillary: 189 mg/dL — ABNORMAL HIGH (ref 70–99)
Glucose-Capillary: 165 mg/dL — ABNORMAL HIGH (ref 70–99)
Glucose-Capillary: 140 mg/dL — ABNORMAL HIGH (ref 70–99)

## 2021-03-05 LAB — URINE CULTURE: Culture: <10000 — AB

## 2021-03-05 LAB — PHOSPHORUS: Phosphorus: 1.7 mg/dL — ABNORMAL LOW (ref 2.5–4.6)

## 2021-03-05 LAB — TSH: TSH: 1.389 u[IU]/mL (ref 0.350–4.500)

## 2021-03-05 IMAGING — CT CT ANGIO CHEST
2 of 7 series · 17 of 46 positions shown · IV contrast (OMNIPAQUE)
Comparison: Portable chest yesterday. Staging CT Chest, Abdomen,
and Pelvis [DATE].

CLINICAL DATA: 74-year-old male with fever, syncope following 1st
chemotherapy for pancreatic cancer. Abnormal D-dimer.

EXAM:
CT ANGIOGRAPHY CHEST WITH CONTRAST
TECHNIQUE: Multidetector CT imaging of the chest was performed using the
standard protocol during bolus administration of intravenous
contrast. Multiplanar CT image reconstructions and MIPs were
obtained to evaluate the vascular anatomy.
CONTRAST:  100mL OMNIPAQUE IOHEXOL 350 MG/ML SOLN

[Series 5: thins · axial · 0.82mm/px · z∈[+1418,+1656]mm · 15 of 273 slices shown]
[im 18/273  lung]
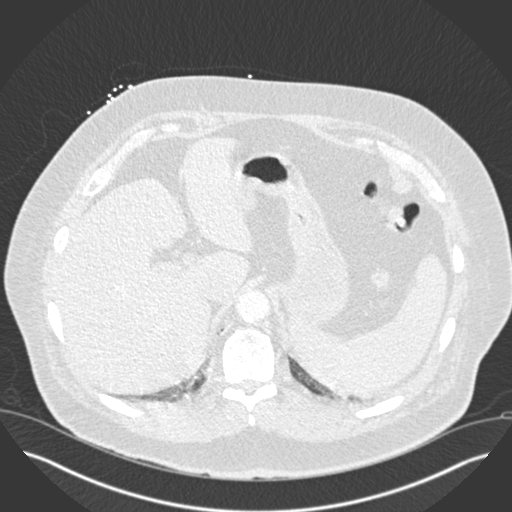
[im 35/273  soft-tissue]
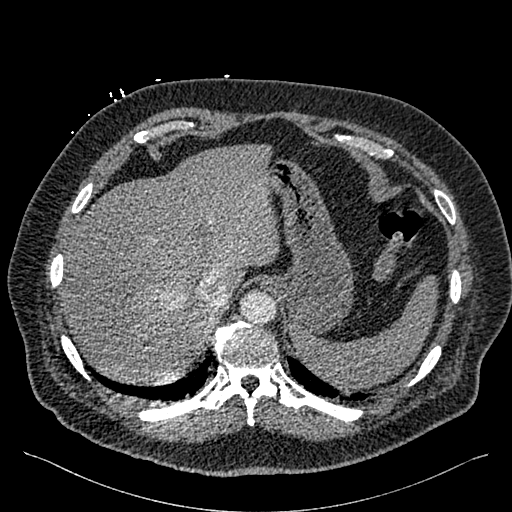
[im 52/273  lung]
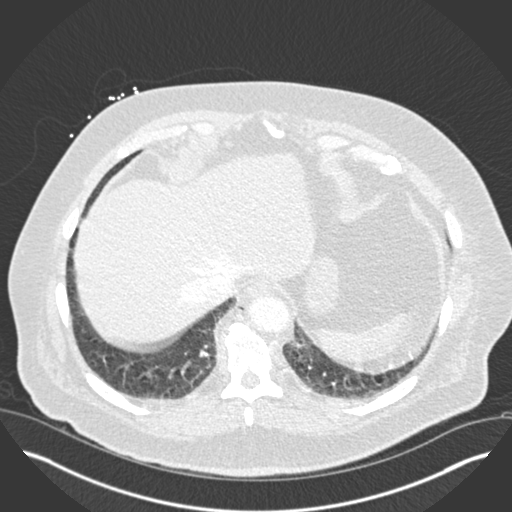
[im 69/273  soft-tissue]
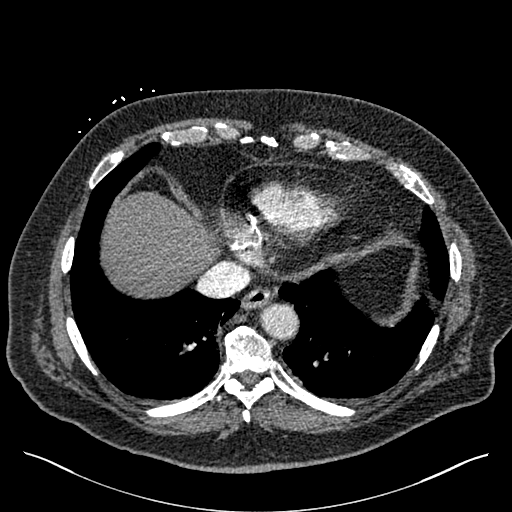
[im 86/273  lung]
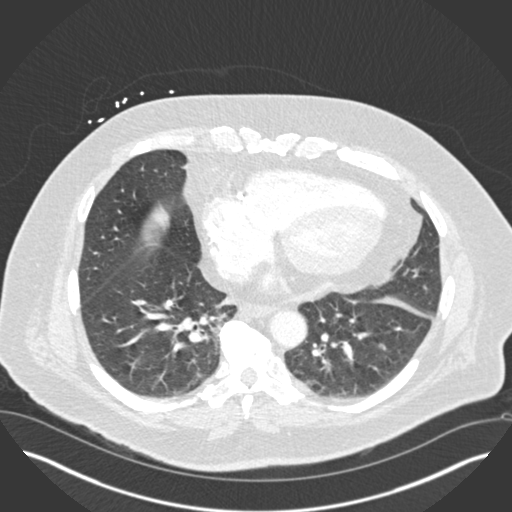
[im 103/273  soft-tissue]
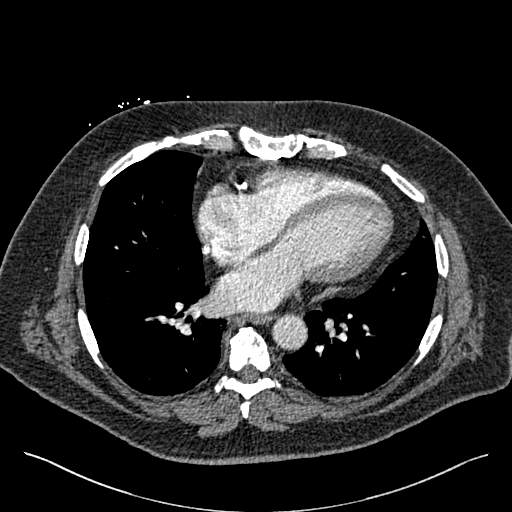
[im 120/273  lung]
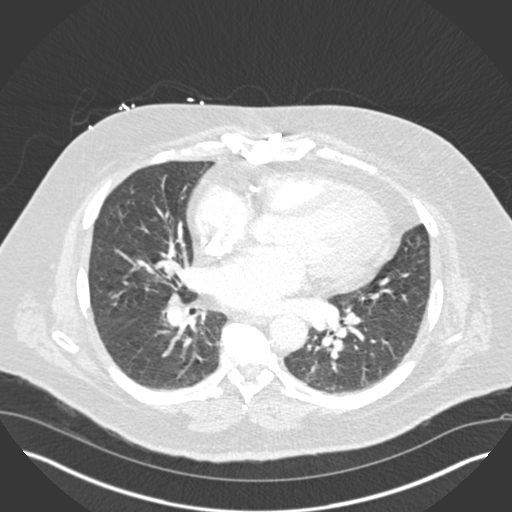
[im 137/273  soft-tissue]
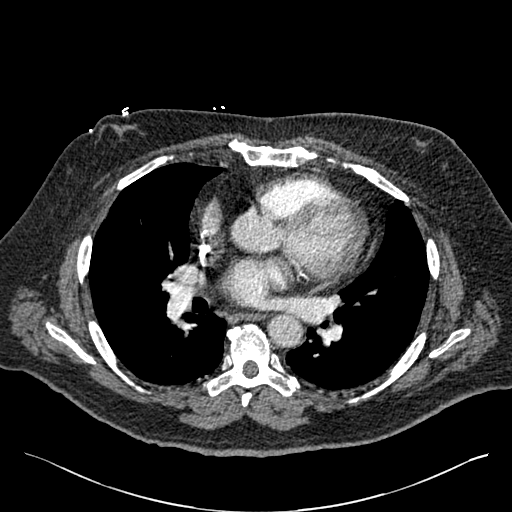
[im 154/273  lung]
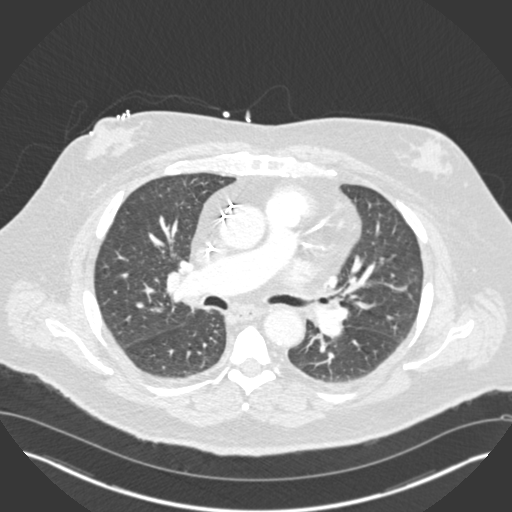
[im 171/273  soft-tissue]
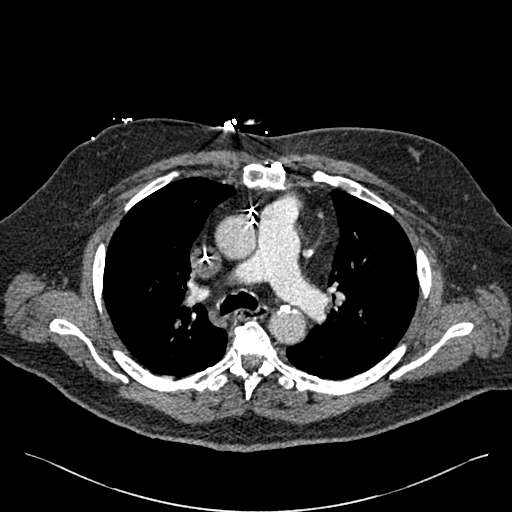
[im 188/273  lung]
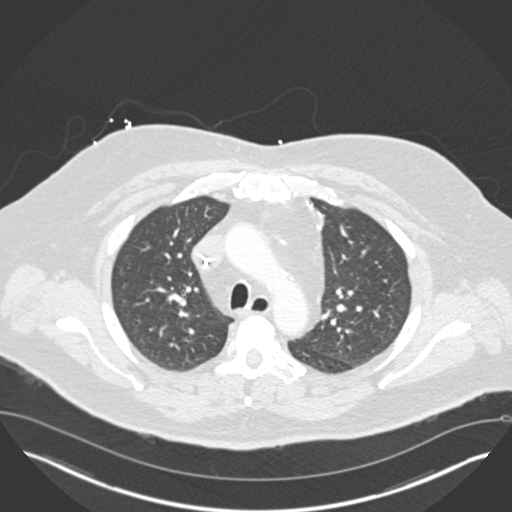
[im 205/273  soft-tissue]
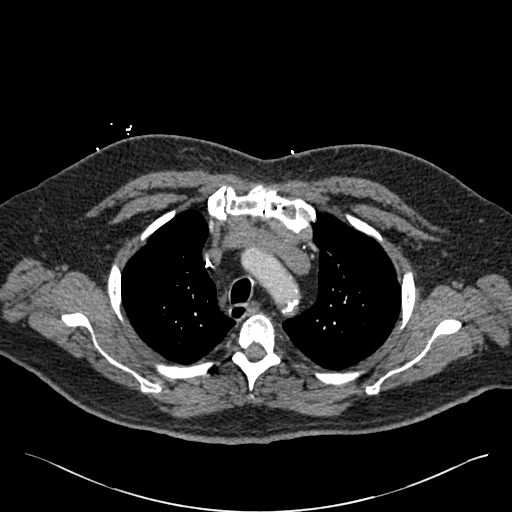
[im 222/273  lung]
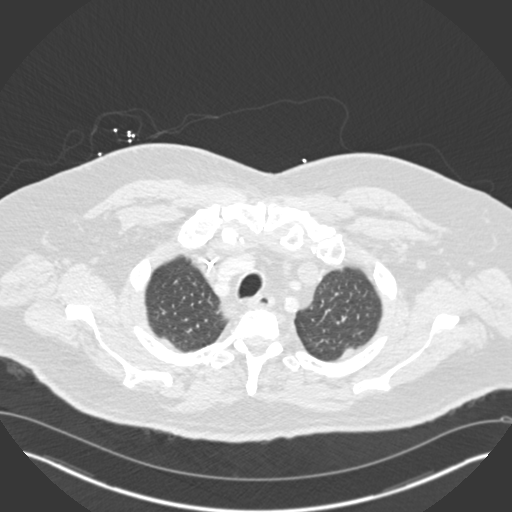
[im 239/273  soft-tissue]
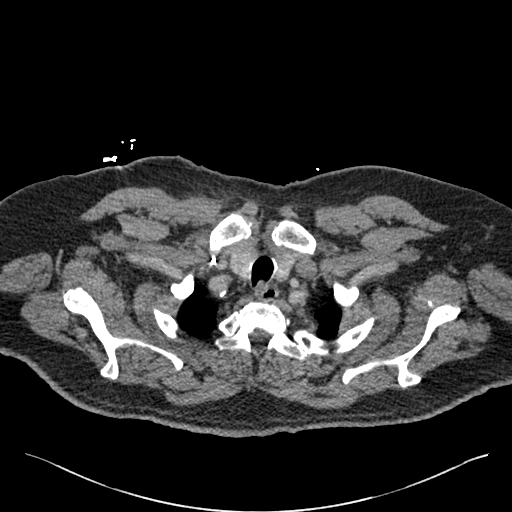
[im 256/273  lung]
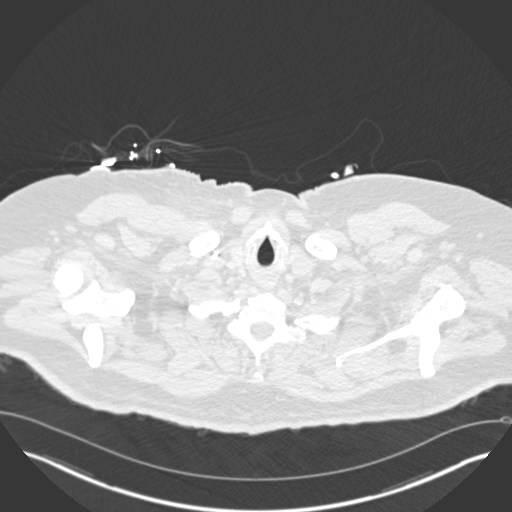

[Series 7: coronal mpr · coronal · 0.54mm/px · 2 of 97 slices shown]
[im 33/97  soft-tissue]
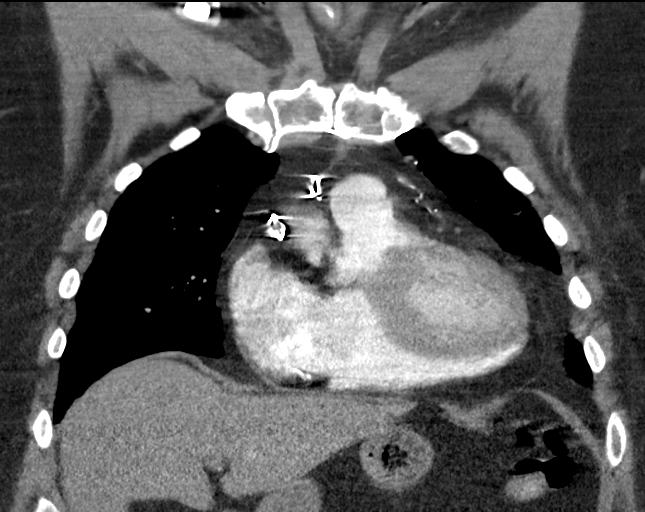
[im 65/97  soft-tissue]
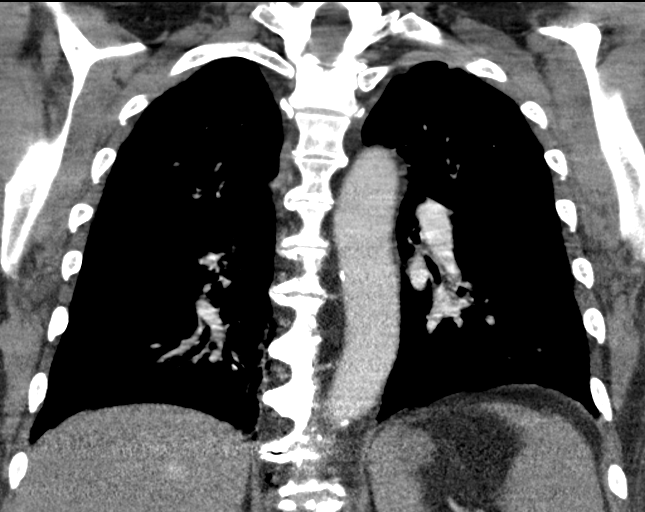

[17 of 46 positions shown; findings below may reference images not displayed]

FINDINGS: Cardiovascular: Adequate contrast bolus timing in the pulmonary
arterial tree. Intermittent mild respiratory motion in the lower
lungs.

No focal filling defect identified in the pulmonary arteries to
suggest acute pulmonary embolism.

Sequelae of CABG. Calcified aortic atherosclerosis. Borderline to
mild cardiomegaly. No pericardial effusion. Right chest Port-A-Cath
now in place.

Mediastinum/Nodes: Negative.  No lymphadenopathy.

Lungs/Pleura: Stable lung volumes from last month. Mild atelectatic
changes to the major airways today which remain patent. Mild motion
artifact at the lung bases. Occasional atelectasis and scarring in
both lungs appears stable. No pleural effusion or acute pulmonary
opacity.

Upper Abdomen: Stable visible liver, spleen and stomach. Absent
gallbladder. Negative visible splenic flexure. Pancreas not
included.

Musculoskeletal: Prior sternotomy. Stable visualized osseous
structures.

Review of the MIP images confirms the above findings.
IMPRESSION: 1. No evidence of acute pulmonary embolus.
2. Stable chest from last month.  No acute or metastatic findings.
3. Aortic Atherosclerosis ([01]-[01]).

## 2021-03-05 MED ORDER — POTASSIUM CHLORIDE CRYS ER 20 MEQ PO TBCR
40.0000 meq | EXTENDED_RELEASE_TABLET | Freq: Two times a day (BID) | ORAL | Status: AC
  Administered 2021-03-05 (×2): 40 meq via ORAL
  Filled 2021-03-05 (×2): qty 2

## 2021-03-05 MED ORDER — SODIUM CHLORIDE 0.9 % IV SOLN: INTRAVENOUS | Status: AC

## 2021-03-05 MED ORDER — SODIUM CHLORIDE 0.9 % IV SOLN: INTRAVENOUS | Status: DC

## 2021-03-05 MED ORDER — PHENOL 1.4 % MT LIQD
1.0000 | OROMUCOSAL | Status: DC | PRN
  Filled 2021-03-05: qty 177

## 2021-03-05 MED ORDER — IOHEXOL 350 MG/ML SOLN
100.0000 mL | Freq: Once | INTRAVENOUS | Status: AC | PRN
  Administered 2021-03-05: 100 mL via INTRAVENOUS

## 2021-03-05 MED ORDER — POTASSIUM PHOSPHATES 15 MMOLE/5ML IV SOLN
20.0000 mmol | Freq: Once | INTRAVENOUS | Status: AC
  Administered 2021-03-05: 20 mmol via INTRAVENOUS
  Filled 2021-03-05: qty 6.67

## 2021-03-05 NOTE — Telephone Encounter (Signed)
Pt still admitted.

## 2021-03-05 NOTE — Progress Notes (Signed)
Patient reporting feeling "chills." Patient afebrile. Patient's daughter and wife called with concerns about patient's rash on his right arm, groin area and "chills." Patient's wife and daughter stated that the patient felt chills before he had his syncopal episode. Vitals WNL. Patient afebrile. Hal Hope, MD was notified and MD ordered blood cultures. Will continue to monitor patient throughout rest of shift.

## 2021-03-05 NOTE — Telephone Encounter (Signed)
Patient has a TOC follow-up appointment scheduled for 03/25/21 at 11:15 AM with Sande Rives, PA, per Roby Lofts, PA.

## 2021-03-05 NOTE — Progress Notes (Addendum)
HEMATOLOGY-ONCOLOGY PROGRESS NOTE  SUBJECTIVE: [name] was admitted due to syncope.  For admission, he reportedly had a fever up to 100.5.  He had contacted our office who recommended evaluation in the emergency room.  However, the patient declined because he thought he would get over it.  Thereafter, he developed worsening weakness with syncope and possible fall.  Found to have new onset A. fib.  Cardiology has been consulted and are adjusting medications.  The patient reports that he feels better today.  He vomited yesterday after eating some meat that was difficult to swallow.  Otherwise, he is not having any nausea or vomiting.  He denies constipation diarrhea.  He has not had any recurrent fevers.  Noted to have a rash on his right arm that is not pruritic or painful.  Has a right lower extremity edema which he states is consistent with baseline.  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  Intake/Output from previous day: 03/21 0701 - 03/22 0700 In: -  Out: 500 [Urine:500]  GENERAL:alert, no distress and comfortable SKIN: Erythema to posterior right forearm EYES: normal, Conjunctiva are pink and non-injected, sclera clear OROPHARYNX:no exudate, no erythema and lips, buccal mucosa, and tongue normal  LUNGS: clear to auscultation and percussion with normal breathing effort HEART: regular  rate & rhythm and no murmurs and no left lower extremity edema, right lower extremity with trace edema ABDOMEN:abdomen soft, non-tender and normal bowel sounds NEURO: alert & oriented x 3 with fluent speech, no focal motor/sensory deficits Port-A-Cath without erythema  LABORATORY DATA:  I have reviewed the data as listed CMP Latest Ref Rng & Units 03/05/2021 03/04/2021 03/04/2021  Glucose 70 - 99 mg/dL 121(H) 199(H) 138(H)  BUN 8 - 23 mg/dL 17 18 19   Creatinine 0.61 - 1.24 mg/dL 1.17 1.19 1.17  Sodium 135 - 145 mmol/L 131(L) 131(L) 133(L)  Potassium 3.5 - 5.1 mmol/L 3.2(L) 3.2(L) 3.3(L)  Chloride 98 - 111 mmol/L 100 100 102  CO2 22 - 32 mmol/L 24 24 24   Calcium 8.9 - 10.3 mg/dL 8.4(L) 8.2(L) 8.2(L)  Total Protein 6.5 - 8.1 g/dL 6.4(L) - 6.2(L)  Total Bilirubin 0.3 - 1.2 mg/dL 1.6(H) - 2.5(H)  Alkaline Phos 38 - 126 U/L 55 - 50  AST 15 - 41 U/L 67(H) - 81(H)  ALT 0 - 44 U/L 64(H) - 50(H)    Lab Results  Component Value Date   WBC 5.8 03/05/2021   HGB 9.4 (L) 03/05/2021   HCT 27.4 (L) 03/05/2021   MCV 92.9 03/05/2021   PLT 100 (L) 03/05/2021   NEUTROABS 7.2 03/04/2021    CT Head Wo Contrast  Result Date: 03/04/2021 CLINICAL DATA:  75 year old male status post 1st treatment for pancreatic cancer 3 days ago. Syncope, loss of consciousness. EXAM: CT HEAD WITHOUT  CONTRAST TECHNIQUE: Contiguous axial images were obtained from the base of the skull through the vertex without intravenous contrast. COMPARISON:  None. FINDINGS: Brain: Cerebral volume is within normal limits for age. No midline shift, mass effect, or evidence of intracranial mass lesion. No ventriculomegaly. Patchy bilateral white matter hypodensity. Deep gray matter nuclei relatively spared. No acute or chronic cortical infarct identified. No acute intracranial hemorrhage identified. Vascular: Calcified atherosclerosis at the skull base. No suspicious intracranial vascular hyperdensity. Skull: No acute or suspicious osseous  lesion. Sinuses/Orbits: Visualized paranasal sinuses and mastoids are clear. Other: No acute orbit or scalp soft tissue finding. Postoperative changes to both globes. IMPRESSION: Moderate for age cerebral white matter changes most commonly due to small vessel disease. No acute or metastatic intracranial abnormality identified on this noncontrast exam. Electronically Signed   By: Genevie Ann M.D.   On: 03/04/2021 05:40   CT ANGIO CHEST PE W OR WO CONTRAST  Result Date: 03/05/2021 CLINICAL DATA:  75 year old male with fever, syncope following 1st chemotherapy for pancreatic cancer. Abnormal D-dimer. EXAM: CT ANGIOGRAPHY CHEST WITH CONTRAST TECHNIQUE: Multidetector CT imaging of the chest was performed using the standard protocol during bolus administration of intravenous contrast. Multiplanar CT image reconstructions and MIPs were obtained to evaluate the vascular anatomy. CONTRAST:  154mL OMNIPAQUE IOHEXOL 350 MG/ML SOLN COMPARISON:  Portable chest yesterday. Staging CT Chest, Abdomen, and Pelvis 01/29/2021. FINDINGS: Cardiovascular: Adequate contrast bolus timing in the pulmonary arterial tree. Intermittent mild respiratory motion in the lower lungs. No focal filling defect identified in the pulmonary arteries to suggest acute pulmonary embolism. Sequelae of CABG. Calcified aortic atherosclerosis. Borderline to mild cardiomegaly. No pericardial effusion. Right chest Port-A-Cath now in place. Mediastinum/Nodes: Negative.  No lymphadenopathy. Lungs/Pleura: Stable lung volumes from last month. Mild atelectatic changes to the major airways today which remain patent. Mild motion artifact at the lung bases. Occasional atelectasis and scarring in both lungs appears stable. No pleural effusion or acute pulmonary opacity. Upper Abdomen: Stable visible liver, spleen and stomach. Absent gallbladder. Negative visible splenic flexure. Pancreas not included. Musculoskeletal: Prior sternotomy. Stable visualized osseous structures.  Review of the MIP images confirms the above findings. IMPRESSION: 1. No evidence of acute pulmonary embolus. 2. Stable chest from last month.  No acute or metastatic findings. 3. Aortic Atherosclerosis (ICD10-I70.0). Electronically Signed   By: Genevie Ann M.D.   On: 03/05/2021 05:29   DG Chest Portable 1 View  Result Date: 03/04/2021 CLINICAL DATA:  Fever following first chemotherapy for pancreatic cancer EXAM: PORTABLE CHEST 1 VIEW COMPARISON:  Radiograph 10/12/2020 FINDINGS: Low volumes and atelectatic change without focal consolidative opacity. No convincing features of edema. No pneumothorax or effusion. Stable cardiomegaly with postsurgical changes from prior sternotomy and CABG. Fracture of the superior most sternal suture is unchanged from comparison exam. A right IJ approach Port-A-Cath tip terminates in the mid SVC. Telemetry leads overlie the chest. No acute osseous or soft tissue abnormality. Degenerative changes are present in the imaged spine and shoulders. IMPRESSION: 1. Low lung volumes and atelectatic change. No other acute cardiopulmonary abnormality. 2. Stable cardiomegaly. 3. Prior sternotomy and CABG. Electronically Signed   By: Lovena Le M.D.   On: 03/04/2021 05:20   DG CHEST PORT 1 VIEW  Result Date: 02/21/2021 CLINICAL DATA:  Post RIGHT Port-A-Cath insertion EXAM: PORTABLE CHEST 1 VIEW COMPARISON:  Chest CT 01/29/2021 FINDINGS: Port in the anterior chest wall with tip in distal SVC. Sternotomy wires overlie normal cardiac silhouette. Normal pulmonary vasculature. No effusion, infiltrate, or  pneumothorax. No acute osseous abnormality. IMPRESSION: No complication following port placement. Electronically Signed   By: Suzy Bouchard M.D.   On: 02/21/2021 15:11   DG Fluoro Guide CV Line-No Report  Result Date: 02/21/2021 Fluoroscopy was utilized by the requesting physician.  No radiographic interpretation.   ECHOCARDIOGRAM COMPLETE  Result Date: 03/04/2021    ECHOCARDIOGRAM REPORT    Patient Name:   [name] [facility] Date of Exam: 03/04/2021 Medical Rec #:  [id number]             Height:       71.0 in Accession #:    [id number]            Weight:       283.0 lb Date of Birth:  [birth year]            BSA:          2.444 m Patient Age:    33 years              BP:           117/63 mmHg Patient Gender: M                     HR:           79 bpm. Exam Location:  Inpatient Procedure: 2D Echo, Cardiac Doppler and Color Doppler Indications:    R55 Syncope  History:        Patient has no prior history of Echocardiogram examinations.                 Previous Myocardial Infarction and CAD; Risk                 Factors:Hypertension, Dyslipidemia and Diabetes.  Sonographer:    Bernadene Person RDCS Referring Phys: [id number] [place]  1. Left ventricular ejection fraction, by estimation, is 60 to 65%. The left ventricle has normal function. The left ventricle has no regional wall motion abnormalities. Left ventricular diastolic parameters are indeterminate.  2. Right ventricular systolic function is normal. The right ventricular size is normal. There is normal pulmonary artery systolic pressure.  3. The mitral valve is normal in structure. No evidence of mitral valve regurgitation.  4. The aortic valve is normal in structure. Aortic valve regurgitation is not visualized.  5. The inferior vena cava is normal in size with greater than 50% respiratory variability, suggesting right atrial pressure of 3 mmHg. FINDINGS  Left Ventricle: Left ventricular ejection fraction, by estimation, is 60 to 65%. The left ventricle has normal function. The left ventricle has no regional wall motion abnormalities. The left ventricular internal cavity size was normal in size. There is  no left ventricular hypertrophy. Left ventricular diastolic parameters are indeterminate. Right Ventricle: The right ventricular size is normal. Right vetricular wall thickness was not assessed. Right ventricular systolic function  is normal. There is normal pulmonary artery systolic pressure. The tricuspid regurgitant velocity is 2.28 m/s, and with an assumed right atrial pressure of 3 mmHg, the estimated right ventricular systolic pressure is 33.2 mmHg. Left Atrium: Left atrial size was normal in size. Right Atrium: Right atrial size was normal in size. Pericardium: There is no evidence of pericardial effusion. Mitral Valve: The mitral valve is normal in structure. No evidence of mitral valve regurgitation. Tricuspid Valve: The tricuspid valve is normal in structure. Tricuspid valve regurgitation is trivial. Aortic Valve: The aortic valve is normal in structure. Aortic valve regurgitation is not visualized.  Pulmonic Valve: The pulmonic valve was normal in structure. Pulmonic valve regurgitation is not visualized. Aorta: The aortic root and ascending aorta are structurally normal, with no evidence of dilitation. Venous: The inferior vena cava is normal in size with greater than 50% respiratory variability, suggesting right atrial pressure of 3 mmHg. IAS/Shunts: No atrial level shunt detected by color flow Doppler.  LEFT VENTRICLE PLAX 2D LVIDd:         4.70 cm  Diastology LVIDs:         3.10 cm  LV e' medial:    6.31 cm/s LV PW:         1.10 cm  LV E/e' medial:  19.0 LV IVS:        1.10 cm  LV e' lateral:   8.49 cm/s LVOT diam:     1.90 cm  LV E/e' lateral: 14.1 LV SV:         71 LV SV Index:   29 LVOT Area:     2.84 cm  RIGHT VENTRICLE RV S prime:     12.90 cm/s TAPSE (M-mode): 1.5 cm LEFT ATRIUM             Index       RIGHT ATRIUM           Index LA diam:        5.60 cm 2.29 cm/m  RA Area:     14.00 cm LA Vol (A2C):   72.6 ml 29.71 ml/m RA Volume:   32.40 ml  13.26 ml/m LA Vol (A4C):   76.6 ml 31.35 ml/m LA Biplane Vol: 79.2 ml 32.41 ml/m  AORTIC VALVE LVOT Vmax:   133.00 cm/s LVOT Vmean:  95.000 cm/s LVOT VTI:    0.250 m  AORTA Ao Root diam: 3.30 cm Ao Asc diam:  3.30 cm MITRAL VALVE                TRICUSPID VALVE MV Area (PHT):  4.60 cm     TR Peak grad:   20.8 mmHg MV Decel Time: 165 msec     TR Vmax:        228.00 cm/s MV E velocity: 120.00 cm/s MV A velocity: 65.60 cm/s   SHUNTS MV E/A ratio:  1.83         Systemic VTI:  0.25 m                             Systemic Diam: 1.90 cm Dorris Carnes MD Electronically signed by Dorris Carnes MD Signature Date/Time: 03/04/2021/4:25:39 PM    Final     ASSESSMENT AND PLAN: 1. 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 ? 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 Normal CA 19-9 01/24/2021 ? CTs 01/29/2021-no pancreatic mass.  No pancreatic ductal dilatation identified.  No definite signs of metastatic disease in the chest, abdomen or pelvis. ? Cycle #1 gemcitabine Abraxane 03/01/2021 2. Diabetes 3. Coronary artery disease 4. Prostate cancer 2013-[facility] with [facility] 5. Hypertension 6. Sleep apnea 7. Coronary artery bypass surgery  2004 8.  Hospital admission 03/04/2021-syncope, A. Fib 9.  Mild thrombocytopenia  [name] is now at day 5 cycle #1 gemcitabine/Abraxane.  He had a fever over the weekend following his chemotherapy.  Fever may be related to drug reaction from gemcitabine.  He has not had any recurrent fevers.  His blood cultures are negative to date and urine culture without significant growth.  Recommend continued follow-up on cultures.  He is noted to have erythema of his right arm which may be a drug reaction from gemcitabine.  Recommend continued monitoring of this area.  He has developed  edema to his right lower extremity.  Will obtain Doppler ultrasound to evaluate for DVT.  He has not had any current syncopal episodes.  The patient was in A. fib but has converted back to sinus rhythm.  Cardiology is following and adjusting medications.  He has been started on apixaban 5 mg twice daily.  Recommendations: 1.  Doppler ultrasound of the right lower extremity to evaluate for DVT. 2.  Continue work-up/management of A. fib and syncope per cardiology. 3.  We will plan for outpatient follow-up at the cancer to continue chemotherapy with gemcitabine and Abraxane on 03/14/2021 4.  Monitor platelet count, will likely fall further following recent chemotherapy   LOS: 0 days   Mikey Bussing, DNP, AGPCNP-BC, AOCNP 03/05/21 [name] was interviewed and examined.  He is known to me with a recent diagnosis of pancreas cancer.  He completed cycle 1 neoadjuvant gemcitabine/Abraxane on 03/01/2021.  He was admitted with a syncope event and was found to have new onset atrial fibrillation and orthostatic hypotension.  I doubt his symptoms are related to the chemotherapy.  He appears well today.  He has erythema and induration at the dorsum of the right forearm.  This may be related to a gemcitabine "pseudocellulitis "or thrombophlebitis.  He also has mild swelling and erythema at the right lower leg.  Can try Benadryl if the right arm erythema persist.  The fever he experienced prior to hospital admission may have been related to gemcitabine.  This is a common toxicity seen with gemcitabine over the first 1-2 days following chemotherapy.  I was present for greater than 50% of today's visit.  I performed medical decision making.

## 2021-03-05 NOTE — Progress Notes (Signed)
PROGRESS NOTE  [name] RCV:[id number] DOB: 1946/10/01 DOA: 03/04/2021 PCP: [name], [name] G, MD  HPI/Recap of past 24 hours: HPI from Dr Bess Harvest is a 75 y.o. male with medical history significant of CAD s/p CABG, HTN, HLD, recent diagnosis of pancreatic cancer presenting with a syncopal episode. He states his symptoms started 1 day PTA, he felt some chills and aches, noted temp of 100.5. The morning PTA, he went to the bathroom, began to feel weak, so he thought he would sit down. He states the next thing he remembers is his family standing over him. He states that they told him they heard a loud crash and found him hanging over his walker. They laid him on the ground. It did not appear that he had any head injury. He apparently was out just a few minutes and confused for a few minutes more. His family called for EMS to bring him to the ED. Of note, he reports feeling a "pinprick" pain in his left chest and fluttering after he regained consciousness. He says it wasn't intense, but it happen 4 times at home and once in the ED. He's not had either of these symptoms before.  Of note, he was recently seen by his cardiologist on 02/27/2021 and was cleared for a Port-A-Cath placement as well as for future pancreatic surgery.  Patient recently had his first chemotherapy infusion on 03/01/2021 for pancreatic cancer. In the ED, CT head was negative. CXR was negative. COVID was negative. Trp were mildly elevated. He was found to be in afib, also noted positive orthostatic hypotension.  TRH was called for admission. Cardiology was consulted for new afib.      Today, patient denies any new complaints, denies any chest pain, palpitations, dizziness while sitting, shortness of breath, abdominal pain, nausea/vomiting/diarrhea.    Assessment/Plan: Active Problems:   Syncope   Syncope likely 2/2 orthostatic hypotension (DM neuropathy) Repeat orthostatic vitals negative CT head  negative Echo done on 03/04/2021 showed EF of 60 to 65%, no regional wall motion abnormalities, left ventricular diastolic parameters are indeterminate Cardiology consulted, started patient on midodrine, advised to DC all BP meds for now Continue midodrine Hold home losartan, metoprolol, Imdur Continue gentle IV fluid Daily orthostatic vitals  New onset paroxysmal A. Fib Currently rate controlled TSH 1.389 Echo as above Held home metoprolol due to orthostatic hypotension Started on Eliquis Monitor closely  Mildly elevated troponin CAD s/p CABG in 2004 HTN/HLD Troponin with a flat trend, not consistent with ACS EKG no acute ST changes Echo as above DC aspirin due to Eliquis, continue to hold Imdur, metoprolol, losartan Continue statins  Remote history of fever Noted RUE erythema-possibly drug reaction from gemcitabine No fever spikes since admission, no leukocytosis Possibly related to chemotherapy, rule out any infection UA unremarkable, UC <10,00 insignificant growth, BC x2 NGTD Chest x-ray unremarkable Monitor fever curve, low threshold to start antibiotics  Mild transaminitis May be related to recent chemotherapy/cancer Trend daily, may need to hold statins  Hyponatremia/hypokalemia/hypophosphatemia Replace as needed Continue IV fluids  Elevated D-dimer RLE swelling CTA chest negative for PE LE Doppler ruled out DVT  Diabetes mellitus type 2 with neuropathy Last A1c 6.3 on 02/2021 Continue SSI, Accu-Cheks, Lantus, hypoglycemic protocol  Normocytic anemia/thrombocytopenia Likely 2/2 possibly recent chemotherapy use Daily CBC  Obesity Lifestyle modification advised    Estimated body mass index is 39.47 kg/m as calculated from the following:   Height as of this encounter: 5\' 11"  (1.803 m).  Weight as of 02/27/21: 128.4 kg.     Code Status: Full  Family Communication: Discussed extensively with patient at bedside  Disposition Plan: Status is:  Observation  The patient remains OBS appropriate and will d/c before 2 midnights.  Dispo: The patient is from: Home              Anticipated d/c is to: Home              Patient currently is not medically stable to d/c.   Difficult to place patient No    Consultants: Cardiology Oncology   Procedures:  None  Antimicrobials:  None  DVT prophylaxis: Eliquis   Objective: Vitals:   03/05/21 0452 03/05/21 1204 03/05/21 1206 03/05/21 1208  BP: 127/70 118/64 117/83 116/67  Pulse: 93 79 82 87  Resp: 18     Temp: 98.7 F (37.1 C)     TempSrc: Oral     SpO2: 98%     Height:        Intake/Output Summary (Last 24 hours) at 03/05/2021 1258 Last data filed at 03/05/2021 1021 Gross per 24 hour  Intake 235 ml  Output 1400 ml  Net -1165 ml   There were no vitals filed for this visit.  Exam:  General: NAD   Cardiovascular: S1, S2 present, R- sided port present  Respiratory: CTAB  Abdomen: Soft, nontender, nondistended, bowel sounds present  Musculoskeletal: RLE swelling  Skin: Noted erythema, with some mild tenderness on R forearm  Psychiatry: Normal mood    Data Reviewed: CBC: Recent Labs  Lab 03/01/21 1228 03/04/21 0434 03/05/21 0500  WBC 6.8 7.7 5.8  NEUTROABS 5.0 7.2  --   HGB 11.1* 10.5* 9.4*  HCT 32.7* 30.2* 27.4*  MCV 92.6 92.1 92.9  PLT 163 104* 295*   Basic Metabolic Panel: Recent Labs  Lab 03/01/21 1228 03/04/21 0434 03/04/21 0615 03/04/21 2340 03/05/21 0500  NA 136 133*  --  131* 131*  K 3.5 3.3*  --  3.2* 3.2*  CL 104 102  --  100 100  CO2 26 24  --  24 24  GLUCOSE 181* 138*  --  199* 121*  BUN 21 19  --  18 17  CREATININE 1.42* 1.17  --  1.19 1.17  CALCIUM 8.9 8.2*  --  8.2* 8.4*  MG  --   --  1.5*  --  1.8  PHOS  --   --   --   --  1.7*   GFR: Estimated Creatinine Clearance: 73.3 mL/min (by C-G formula based on SCr of 1.17 mg/dL). Liver Function Tests: Recent Labs  Lab 03/01/21 1228 03/04/21 0434 03/05/21 0500  AST  25 81* 67*  ALT 19 50* 64*  ALKPHOS 69 50 55  BILITOT 1.0 2.5* 1.6*  PROT 7.0 6.2* 6.4*  ALBUMIN 3.6 3.1* 3.2*   No results for input(s): LIPASE, AMYLASE in the last 168 hours. No results for input(s): AMMONIA in the last 168 hours. Coagulation Profile: No results for input(s): INR, PROTIME in the last 168 hours. Cardiac Enzymes: No results for input(s): CKTOTAL, CKMB, CKMBINDEX, TROPONINI in the last 168 hours. BNP (last 3 results) No results for input(s): PROBNP in the last 8760 hours. HbA1C: Recent Labs    03/04/21 0434  HGBA1C 6.3*   CBG: Recent Labs  Lab 03/04/21 1653 03/04/21 2205 03/05/21 0449 03/05/21 0744 03/05/21 1206  GLUCAP 140* 221* 115* 124* 189*   Lipid Profile: No results for input(s): CHOL, HDL, LDLCALC, TRIG,  CHOLHDL, LDLDIRECT in the last 72 hours. Thyroid Function Tests: Recent Labs    03/05/21 0500  TSH 1.389   Anemia Panel: No results for input(s): VITAMINB12, FOLATE, FERRITIN, TIBC, IRON, RETICCTPCT in the last 72 hours. Urine analysis:    Component Value Date/Time   COLORURINE YELLOW 03/04/2021 0750   APPEARANCEUR CLEAR 03/04/2021 0750   LABSPEC 1.013 03/04/2021 0750   PHURINE 6.0 03/04/2021 0750   GLUCOSEU NEGATIVE 03/04/2021 0750   HGBUR NEGATIVE 03/04/2021 0750   BILIRUBINUR NEGATIVE 03/04/2021 0750   KETONESUR NEGATIVE 03/04/2021 0750   PROTEINUR NEGATIVE 03/04/2021 0750   NITRITE NEGATIVE 03/04/2021 0750   LEUKOCYTESUR NEGATIVE 03/04/2021 0750   Sepsis Labs: @LABRCNTIP (procalcitonin:4,lacticidven:4)  ) Recent Results (from the past 240 hour(s))  Blood culture (routine x 2)     Status: None (Preliminary result)   Collection Time: 03/04/21  4:34 AM   Specimen: BLOOD  Result Value Ref Range Status   Specimen Description   Final    BLOOD LEFT ARM Performed at [facility], [place] [address] [place], [place] [address]    Special Requests   Final    BOTTLES DRAWN AEROBIC AND ANAEROBIC Blood Culture results  may not be optimal due to an inadequate volume of blood received in culture bottles Performed at [place] [facility] [address], [place], [place] [address]    Culture   Final    NO GROWTH 1 DAY Performed at [facility], [place] [address] [place], [place] [address]    Report Status PENDING  Incomplete  Blood culture (routine x 2)     Status: None (Preliminary result)   Collection Time: 03/04/21  5:03 AM   Specimen: BLOOD  Result Value Ref Range Status   Specimen Description   Final    BLOOD RIGHT ANTECUBITAL Performed at [facility] [address], [place], [place] [address]    Special Requests   Final    BOTTLES DRAWN AEROBIC AND ANAEROBIC Blood Culture adequate volume Performed at [place] [facility] [address], [place], [place] [address]    Culture   Final    NO GROWTH 1 DAY Performed at [facility], [place] [address], [place], [place] [address]    Report Status PENDING  Incomplete  Resp Panel by RT-PCR (Flu A&B, Covid) Nasopharyngeal Swab     Status: None   Collection Time: 03/04/21  6:08 AM   Specimen: Nasopharyngeal Swab; Nasopharyngeal(NP) swabs in vial transport medium  Result Value Ref Range Status   SARS Coronavirus 2 by RT PCR NEGATIVE NEGATIVE Final    Comment: (NOTE) SARS-CoV-2 target nucleic acids are NOT DETECTED.  The SARS-CoV-2 RNA is generally detectable in upper respiratory specimens during the acute phase of infection. The lowest concentration of SARS-CoV-2 viral copies this assay can detect is 138 copies/mL. A negative result does not preclude SARS-Cov-2 infection and should not be used as the sole basis for treatment or other patient management decisions. A negative result may occur with  improper specimen collection/handling, submission of specimen other than nasopharyngeal swab, presence of viral mutation(s) within the areas targeted by this assay, and inadequate number of  viral copies(<138 copies/mL). A negative result must be combined with clinical observations, patient history, and epidemiological information. The expected result is Negative.  Fact Sheet for Patients:  EntrepreneurPulse.com.au  Fact Sheet for Healthcare Providers:  IncredibleEmployment.be  This test is no t yet approved or cleared by the [place] and  has been authorized for detection and/or diagnosis of SARS-CoV-2 by FDA under an Emergency Use Authorization (EUA). This EUA will remain  in effect (meaning this test can be used) for the duration of the COVID-19 declaration under Section 564(b)(1) of the Act, 21 U.S.C.section 360bbb-3(b)(1), unless the authorization is terminated  or revoked sooner.       Influenza A by PCR NEGATIVE NEGATIVE Final   Influenza B by PCR NEGATIVE NEGATIVE Final    Comment: (NOTE) The Xpert Xpress SARS-CoV-2/FLU/RSV plus assay is intended as an aid in the diagnosis of influenza from Nasopharyngeal swab specimens and should not be used as a sole basis for treatment. Nasal washings and aspirates are unacceptable for Xpert Xpress SARS-CoV-2/FLU/RSV testing.  Fact Sheet for Patients: EntrepreneurPulse.com.au  Fact Sheet for Healthcare Providers: IncredibleEmployment.be  This test is not yet approved or cleared by the [place] FDA and has been authorized for detection and/or diagnosis of SARS-CoV-2 by FDA under an Emergency Use Authorization (EUA). This EUA will remain in effect (meaning this test can be used) for the duration of the COVID-19 declaration under Section 564(b)(1) of the Act, 21 U.S.C. section 360bbb-3(b)(1), unless the authorization is terminated or revoked.  Performed at [facility], [place] [address], [place], [place] [address]   Urine Culture     Status: Abnormal   Collection Time: 03/04/21  7:50 AM   Specimen: Urine, Clean  Catch  Result Value Ref Range Status   Specimen Description   Final    URINE, CLEAN CATCH Performed at [facility], [place] [address], [place], [place] [address]    Special Requests   Final    NONE Performed at [facility], [place] [address] [place], [place] [address]    Culture (A)  Final    <10,000 COLONIES/mL INSIGNIFICANT GROWTH Performed at [facility] [address] [place], [place] [address]    Report Status 03/05/2021 FINAL  Final      Studies: CT ANGIO CHEST PE W OR WO CONTRAST  Result Date: 03/05/2021 CLINICAL DATA:  75 year old male with fever, syncope following 1st chemotherapy for pancreatic cancer. Abnormal D-dimer. EXAM: CT ANGIOGRAPHY CHEST WITH CONTRAST TECHNIQUE: Multidetector CT imaging of the chest was performed using the standard protocol during bolus administration of intravenous contrast. Multiplanar CT image reconstructions and MIPs were obtained to evaluate the vascular anatomy. CONTRAST:  147mL OMNIPAQUE IOHEXOL 350 MG/ML SOLN COMPARISON:  Portable chest yesterday. Staging CT Chest, Abdomen, and Pelvis 01/29/2021. FINDINGS: Cardiovascular: Adequate contrast bolus timing in the pulmonary arterial tree. Intermittent mild respiratory motion in the lower lungs. No focal filling defect identified in the pulmonary arteries to suggest acute pulmonary embolism. Sequelae of CABG. Calcified aortic atherosclerosis. Borderline to mild cardiomegaly. No pericardial effusion. Right chest Port-A-Cath now in place. Mediastinum/Nodes: Negative.  No lymphadenopathy. Lungs/Pleura: Stable lung volumes from last month. Mild atelectatic changes to the major airways today which remain patent. Mild motion artifact at the lung bases. Occasional atelectasis and scarring in both lungs appears stable. No pleural effusion or acute pulmonary opacity. Upper Abdomen: Stable visible liver, spleen and stomach. Absent gallbladder. Negative visible splenic  flexure. Pancreas not included. Musculoskeletal: Prior sternotomy. Stable visualized osseous structures. Review of the MIP images confirms the above findings. IMPRESSION: 1. No evidence of acute pulmonary embolus. 2. Stable chest from last month.  No acute or metastatic findings. 3. Aortic Atherosclerosis (ICD10-I70.0). Electronically Signed   By: Genevie Ann M.D.   On: 03/05/2021 05:29  VAS [place] LOWER EXTREMITY VENOUS (DVT)  Result Date: 03/05/2021  Lower Venous DVT Study Indications: Swelling.  Risk Factors: None identified. Limitations: Poor ultrasound/tissue interface and body habitus. Comparison Study: No prior studies. Performing Technologist: Oliver Hum RVT  Examination Guidelines: A complete evaluation includes B-mode imaging, spectral Doppler, color Doppler, and power Doppler as needed of all accessible portions of each vessel. Bilateral testing is considered an integral part of a complete examination. Limited examinations for reoccurring indications may be performed as noted. The reflux portion of the exam is performed with the patient in reverse Trendelenburg.  +---------+---------------+---------+-----------+----------+--------------+ RIGHT    CompressibilityPhasicitySpontaneityPropertiesThrombus Aging +---------+---------------+---------+-----------+----------+--------------+ CFV      Full           Yes      Yes                                 +---------+---------------+---------+-----------+----------+--------------+ SFJ      Full                                                        +---------+---------------+---------+-----------+----------+--------------+ FV Prox  Full                                                        +---------+---------------+---------+-----------+----------+--------------+ FV Mid   Full                                                        +---------+---------------+---------+-----------+----------+--------------+ FV DistalFull                                                         +---------+---------------+---------+-----------+----------+--------------+ PFV      Full                                                        +---------+---------------+---------+-----------+----------+--------------+ POP      Full           Yes      Yes                                 +---------+---------------+---------+-----------+----------+--------------+ PTV      Full                                                        +---------+---------------+---------+-----------+----------+--------------+ PERO     Full                                                        +---------+---------------+---------+-----------+----------+--------------+   +----+---------------+---------+-----------+----------+--------------+  LEFTCompressibilityPhasicitySpontaneityPropertiesThrombus Aging +----+---------------+---------+-----------+----------+--------------+ CFV Full           Yes      Yes                                 +----+---------------+---------+-----------+----------+--------------+     Summary: RIGHT: - There is no evidence of deep vein thrombosis in the lower extremity.  - No cystic structure found in the popliteal fossa.  LEFT: - No evidence of common femoral vein obstruction.  *See table(s) above for measurements and observations.    Preliminary     Scheduled Meds: . apixaban  5 mg Oral BID  . atorvastatin  40 mg Oral QHS  . Chlorhexidine Gluconate Cloth  6 each Topical Daily  . cycloSPORINE  1 drop Both Eyes BID  . feeding supplement  1 Container Oral TID BM  . FLUoxetine  40 mg Oral Daily  . insulin aspart  0-15 Units Subcutaneous TID WC  . insulin aspart  0-5 Units Subcutaneous QHS  . insulin glargine  35 Units Subcutaneous QHS  . magnesium oxide  400 mg Oral BID  . midodrine  5 mg Oral TID WC  . multivitamin with minerals  1 tablet Oral Daily  . pantoprazole  40 mg Oral Daily  . potassium  chloride  40 mEq Oral BID  . sodium chloride flush  3 mL Intravenous Q12H    Continuous Infusions: . sodium chloride 75 mL/hr at 03/05/21 0844  . potassium PHOSPHATE IVPB (in mmol) 20 mmol (03/05/21 1135)     LOS: 0 days     Alma Friendly, MD [facility]  If 7PM-7AM, please contact night-coverage [email or web address] 03/05/2021, 12:58 PM

## 2021-03-05 NOTE — Progress Notes (Signed)
Right lower extremity venous duplex has been completed. Preliminary results can be found in CV Proc through chart review.   03/05/21 11:52 AM [name] RVT

## 2021-03-05 NOTE — Progress Notes (Addendum)
Progress Note  Patient Name: [name] Date of Encounter: 03/05/2021  [facility] Cardiologist: Peter [name], MD   Subjective   No recurrent dizziness/lightheadedness/syncope. Happy to hear he is being taken off his antihypertensive medications. No complaints of chest pain or palpitations.  Inpatient Medications    Scheduled Meds: . apixaban  5 mg Oral BID  . atorvastatin  40 mg Oral QHS  . Chlorhexidine Gluconate Cloth  6 each Topical Daily  . cycloSPORINE  1 drop Both Eyes BID  . feeding supplement  1 Container Oral TID BM  . FLUoxetine  40 mg Oral Daily  . insulin aspart  0-15 Units Subcutaneous TID WC  . insulin aspart  0-5 Units Subcutaneous QHS  . insulin glargine  35 Units Subcutaneous QHS  . magnesium oxide  400 mg Oral BID  . midodrine  5 mg Oral TID WC  . multivitamin with minerals  1 tablet Oral Daily  . pantoprazole  40 mg Oral Daily  . potassium chloride  40 mEq Oral BID  . sodium chloride flush  3 mL Intravenous Q12H   Continuous Infusions: . sodium chloride 75 mL/hr at 03/05/21 0844   PRN Meds: acetaminophen, nitroGLYCERIN, ondansetron **OR** ondansetron (ZOFRAN) IV   Vital Signs    Vitals:   03/04/21 1227 03/04/21 1704 03/04/21 2202 03/05/21 0452  BP: 108/90 112/60 128/69 127/70  Pulse: 80 79 83 93  Resp: (!) 22 16 20 18   Temp:  98.4 F (36.9 C) 99.1 F (37.3 C) 98.7 F (37.1 C)  TempSrc:  Oral Oral Oral  SpO2:  100% 99% 98%  Height:        Intake/Output Summary (Last 24 hours) at 03/05/2021 0931 Last data filed at 03/05/2021 0844 Gross per 24 hour  Intake -  Output 1000 ml  Net -1000 ml   Last 3 Weights 02/27/2021 02/21/2021 02/14/2021  Weight (lbs) 283 lb 276 lb 280 lb 11.2 oz  Weight (kg) 128.368 kg 125.193 kg 127.325 kg      Telemetry    Sinus rhythm with occasional PVCs - Personally Reviewed  ECG    No new tracings - Personally Reviewed  Physical Exam   GEN: Sitting upright in bed in no acute distress.   Neck:  No JVD Cardiac: RRR, no murmurs, rubs, or gallops.  Respiratory: Clear to auscultation bilaterally. GI: Soft, obese, nontender, non-distended  MS: No edema; No deformity. Neuro:  Nonfocal  Psych: Normal affect   Labs    High Sensitivity Troponin:   Recent Labs  Lab 03/04/21 0434 03/04/21 0615  TROPONINIHS 44* 40*      Chemistry Recent Labs  Lab 03/01/21 1228 03/04/21 0434 03/04/21 2340 03/05/21 0500  NA 136 133* 131* 131*  K 3.5 3.3* 3.2* 3.2*  CL 104 102 100 100  CO2 26 24 24 24   GLUCOSE 181* 138* 199* 121*  BUN 21 19 18 17   CREATININE 1.42* 1.17 1.19 1.17  CALCIUM 8.9 8.2* 8.2* 8.4*  PROT 7.0 6.2*  --  6.4*  ALBUMIN 3.6 3.1*  --  3.2*  AST 25 81*  --  67*  ALT 19 50*  --  64*  ALKPHOS 69 50  --  55  BILITOT 1.0 2.5*  --  1.6*  GFRNONAA 51* >60 >60 >60  ANIONGAP 6 7 7 7      Hematology Recent Labs  Lab 03/01/21 1228 03/04/21 0434 03/05/21 0500  WBC 6.8 7.7 5.8  RBC 3.53* 3.28* 2.95*  HGB 11.1* 10.5* 9.4*  HCT 32.7* 30.2* 27.4*  MCV 92.6 92.1 92.9  MCH 31.4 32.0 31.9  MCHC 33.9 34.8 34.3  RDW 14.9 14.4 14.6  PLT 163 104* 100*    BNPNo results for input(s): BNP, PROBNP in the last 168 hours.   DDimer  Recent Labs  Lab 03/04/21 1257  DDIMER 2.20*     Radiology    CT Head Wo Contrast  Result Date: 03/04/2021 CLINICAL DATA:  75 year old male status post 1st treatment for pancreatic cancer 3 days ago. Syncope, loss of consciousness. EXAM: CT HEAD WITHOUT CONTRAST TECHNIQUE: Contiguous axial images were obtained from the base of the skull through the vertex without intravenous contrast. COMPARISON:  None. FINDINGS: Brain: Cerebral volume is within normal limits for age. No midline shift, mass effect, or evidence of intracranial mass lesion. No ventriculomegaly. Patchy bilateral white matter hypodensity. Deep gray matter nuclei relatively spared. No acute or chronic cortical infarct identified. No acute intracranial hemorrhage identified. Vascular:  Calcified atherosclerosis at the skull base. No suspicious intracranial vascular hyperdensity. Skull: No acute or suspicious osseous lesion. Sinuses/Orbits: Visualized paranasal sinuses and mastoids are clear. Other: No acute orbit or scalp soft tissue finding. Postoperative changes to both globes. IMPRESSION: Moderate for age cerebral white matter changes most commonly due to small vessel disease. No acute or metastatic intracranial abnormality identified on this noncontrast exam. Electronically Signed   By: Genevie Ann M.D.   On: 03/04/2021 05:40   CT ANGIO CHEST PE W OR WO CONTRAST  Result Date: 03/05/2021 CLINICAL DATA:  75 year old male with fever, syncope following 1st chemotherapy for pancreatic cancer. Abnormal D-dimer. EXAM: CT ANGIOGRAPHY CHEST WITH CONTRAST TECHNIQUE: Multidetector CT imaging of the chest was performed using the standard protocol during bolus administration of intravenous contrast. Multiplanar CT image reconstructions and MIPs were obtained to evaluate the vascular anatomy. CONTRAST:  187mL OMNIPAQUE IOHEXOL 350 MG/ML SOLN COMPARISON:  Portable chest yesterday. Staging CT Chest, Abdomen, and Pelvis 01/29/2021. FINDINGS: Cardiovascular: Adequate contrast bolus timing in the pulmonary arterial tree. Intermittent mild respiratory motion in the lower lungs. No focal filling defect identified in the pulmonary arteries to suggest acute pulmonary embolism. Sequelae of CABG. Calcified aortic atherosclerosis. Borderline to mild cardiomegaly. No pericardial effusion. Right chest Port-A-Cath now in place. Mediastinum/Nodes: Negative.  No lymphadenopathy. Lungs/Pleura: Stable lung volumes from last month. Mild atelectatic changes to the major airways today which remain patent. Mild motion artifact at the lung bases. Occasional atelectasis and scarring in both lungs appears stable. No pleural effusion or acute pulmonary opacity. Upper Abdomen: Stable visible liver, spleen and stomach. Absent  gallbladder. Negative visible splenic flexure. Pancreas not included. Musculoskeletal: Prior sternotomy. Stable visualized osseous structures. Review of the MIP images confirms the above findings. IMPRESSION: 1. No evidence of acute pulmonary embolus. 2. Stable chest from last month.  No acute or metastatic findings. 3. Aortic Atherosclerosis (ICD10-I70.0). Electronically Signed   By: Genevie Ann M.D.   On: 03/05/2021 05:29   DG Chest Portable 1 View  Result Date: 03/04/2021 CLINICAL DATA:  Fever following first chemotherapy for pancreatic cancer EXAM: PORTABLE CHEST 1 VIEW COMPARISON:  Radiograph 10/12/2020 FINDINGS: Low volumes and atelectatic change without focal consolidative opacity. No convincing features of edema. No pneumothorax or effusion. Stable cardiomegaly with postsurgical changes from prior sternotomy and CABG. Fracture of the superior most sternal suture is unchanged from comparison exam. A right IJ approach Port-A-Cath tip terminates in the mid SVC. Telemetry leads overlie the chest. No acute osseous or soft tissue abnormality. Degenerative changes are  present in the imaged spine and shoulders. IMPRESSION: 1. Low lung volumes and atelectatic change. No other acute cardiopulmonary abnormality. 2. Stable cardiomegaly. 3. Prior sternotomy and CABG. Electronically Signed   By: Lovena Le M.D.   On: 03/04/2021 05:20   ECHOCARDIOGRAM COMPLETE  Result Date: 03/04/2021    ECHOCARDIOGRAM REPORT   Patient Name:   [name] [facility] Date of Exam: 03/04/2021 Medical Rec #:  [id number]             Height:       71.0 in Accession #:    [id number]            Weight:       283.0 lb Date of Birth:  02-28-1946            BSA:          2.444 m Patient Age:    12 years              BP:           117/63 mmHg Patient Gender: M                     HR:           79 bpm. Exam Location:  Inpatient Procedure: 2D Echo, Cardiac Doppler and Color Doppler Indications:    R55 Syncope  History:        Patient has no prior  history of Echocardiogram examinations.                 Previous Myocardial Infarction and CAD; Risk                 Factors:Hypertension, Dyslipidemia and Diabetes.  Sonographer:    Bernadene Person RDCS Referring Phys: [id number] [place]  1. Left ventricular ejection fraction, by estimation, is 60 to 65%. The left ventricle has normal function. The left ventricle has no regional wall motion abnormalities. Left ventricular diastolic parameters are indeterminate.  2. Right ventricular systolic function is normal. The right ventricular size is normal. There is normal pulmonary artery systolic pressure.  3. The mitral valve is normal in structure. No evidence of mitral valve regurgitation.  4. The aortic valve is normal in structure. Aortic valve regurgitation is not visualized.  5. The inferior vena cava is normal in size with greater than 50% respiratory variability, suggesting right atrial pressure of 3 mmHg. FINDINGS  Left Ventricle: Left ventricular ejection fraction, by estimation, is 60 to 65%. The left ventricle has normal function. The left ventricle has no regional wall motion abnormalities. The left ventricular internal cavity size was normal in size. There is  no left ventricular hypertrophy. Left ventricular diastolic parameters are indeterminate. Right Ventricle: The right ventricular size is normal. Right vetricular wall thickness was not assessed. Right ventricular systolic function is normal. There is normal pulmonary artery systolic pressure. The tricuspid regurgitant velocity is 2.28 m/s, and with an assumed right atrial pressure of 3 mmHg, the estimated right ventricular systolic pressure is 29.9 mmHg. Left Atrium: Left atrial size was normal in size. Right Atrium: Right atrial size was normal in size. Pericardium: There is no evidence of pericardial effusion. Mitral Valve: The mitral valve is normal in structure. No evidence of mitral valve regurgitation. Tricuspid Valve: The  tricuspid valve is normal in structure. Tricuspid valve regurgitation is trivial. Aortic Valve: The aortic valve is normal in structure. Aortic valve regurgitation is not visualized. Pulmonic Valve: The pulmonic valve was  normal in structure. Pulmonic valve regurgitation is not visualized. Aorta: The aortic root and ascending aorta are structurally normal, with no evidence of dilitation. Venous: The inferior vena cava is normal in size with greater than 50% respiratory variability, suggesting right atrial pressure of 3 mmHg. IAS/Shunts: No atrial level shunt detected by color flow Doppler.  LEFT VENTRICLE PLAX 2D LVIDd:         4.70 cm  Diastology LVIDs:         3.10 cm  LV e' medial:    6.31 cm/s LV PW:         1.10 cm  LV E/e' medial:  19.0 LV IVS:        1.10 cm  LV e' lateral:   8.49 cm/s LVOT diam:     1.90 cm  LV E/e' lateral: 14.1 LV SV:         71 LV SV Index:   29 LVOT Area:     2.84 cm  RIGHT VENTRICLE RV S prime:     12.90 cm/s TAPSE (M-mode): 1.5 cm LEFT ATRIUM             Index       RIGHT ATRIUM           Index LA diam:        5.60 cm 2.29 cm/m  RA Area:     14.00 cm LA Vol (A2C):   72.6 ml 29.71 ml/m RA Volume:   32.40 ml  13.26 ml/m LA Vol (A4C):   76.6 ml 31.35 ml/m LA Biplane Vol: 79.2 ml 32.41 ml/m  AORTIC VALVE LVOT Vmax:   133.00 cm/s LVOT Vmean:  95.000 cm/s LVOT VTI:    0.250 m  AORTA Ao Root diam: 3.30 cm Ao Asc diam:  3.30 cm MITRAL VALVE                TRICUSPID VALVE MV Area (PHT): 4.60 cm     TR Peak grad:   20.8 mmHg MV Decel Time: 165 msec     TR Vmax:        228.00 cm/s MV E velocity: 120.00 cm/s MV A velocity: 65.60 cm/s   SHUNTS MV E/A ratio:  1.83         Systemic VTI:  0.25 m                             Systemic Diam: 1.90 cm Dorris Carnes MD Electronically signed by Dorris Carnes MD Signature Date/Time: 03/04/2021/4:25:39 PM    Final     Cardiac Studies   Echocardiogram 03/04/21: 1. Left ventricular ejection fraction, by estimation, is 60 to 65%. The  left ventricle has  normal function. The left ventricle has no regional  wall motion abnormalities. Left ventricular diastolic parameters are  indeterminate.  2. Right ventricular systolic function is normal. The right ventricular  size is normal. There is normal pulmonary artery systolic pressure.  3. The mitral valve is normal in structure. No evidence of mitral valve  regurgitation.  4. The aortic valve is normal in structure. Aortic valve regurgitation is  not visualized.  5. The inferior vena cava is normal in size with greater than 50%  respiratory variability, suggesting right atrial pressure of 3 mmHg.   Patient Profile     75 y.o. male with a PMH of CAD s/p CABG in 2004, HTN, HLD, DM type 2, CKD stage 3a, OSA on CPAP, GERD,  and recently diagnosed pancreatic cancer 12/2020 who is being followed by cardiology for the evaluation of new onset atrial fibrillation and syncope  Assessment & Plan    1. New onset paroxysmal atrial fibrillation: patient presented following a syncopal episode and was found to be in atrial fibrillation with CVR on EKG/telemetry. No prior history of Afib. He noticed some palpitations following his syncopal episode. HsTrop 44>40. No ischemic changes on EKG. He did receive his first chemotherapy 03/01/21 for management of recently diagnosed pancreatic cancer and felt poorly the day prior to admission with fever 101F, weakness, and aches/chills. There was questionable head strike with syncope but CT Head snowed no acute findings. Hgb 10.5 on admission, with baseline in the 11s, though PLT count has dipped to 104 today from 163 03/01/21. He auto-converted back to sinus rhythm without recurrence on telemetry. Echo reassuring with EF 60-65%, indeterminate LV diastolic function, normal LA/RA size, and no significant valvular abnormalities.  - CHA2DS2-VASc Score = 5 [CHF History: No, HTN History: Yes, Diabetes History: Yes, Stroke History: No, Vascular Disease History: Yes, Age Score: 2, Gender  Score: 0].  Therefore, the patient's annual risk of stroke is 7.2 %.    - Started on apixaban 5mg  BID for stroke ppx. Will need very close monitoring of Hgb/PLT count outpatient, especially with ongoing chemotherapy.  - Home metoprolol discontinued after patient discovered to have profound orthostatic hypotension  2. Syncope: patient experienced a syncopal episode shortly after urinating in the early morning hours of 03/04/21. Felt weakness, dizziness, lightheadedness prior to syncope, then fleeting pinprick chest pain after regaining consciousness. He had first chemo infusion 03/01/21 and felt poorly 03/03/21 with fever of 101F, poor po intake, and generalized weakness. He describes occasional orthostatic symptoms at baseline though no recent syncope prior to today. Orthostatic vitals with profound hypotension with drop in SBP to 59; likely the etiology of his syncope. Home losartan, metoprolol, and imdur discontinued and he was started on midodrine. Echo reassuring with EF 60-65%, no RWMA, and no significant valvular abnormalities.  - Will repeat orthostatic vitals now that patient is on midodrine - Continue to monitor on telemetry  3. Elevated troponin in patient with CAD s/p CABG in 2004: possible fleeting chest discomfort immediately following syncope, though no recent anginal complaints. HsTrop 44>40 - low flat trend not c/w ACS. EKG is non-ischemic. Echo with EF 60-65% and no RWMA. Aspirin discontinued given need for anticoagulation. Imdur and metoprolol discontinued in the setting of profound orthostatic hypotension - Continue statin  4. HTN: Orthostatic vitals with profound drop in BP to 59/50. Home losartan and metoprolol discontinued. He was started on midodrine  - Continue midodrine as above  5. HLD: LDL 55 09/2020; at goal of <70 - Continue atorvastatin - monitor LFTs closely with ongoing chemotherapy. Low threshold to hold statin if LFTs uptrend  6. DM type 2: A1C 6.3 09/2020; at  goal of <7 - Continue management per primary team  7. Pancreatic cancer: diagnosed 12/2020 with recent port-a-cath placement and initiation of chemotherapy 03/01/21. Planning for 4 months of chemo, followed by surgical resection. Possible recent treatment contributed to #1/2 - Continue management per primary team  8. Hypokalemia: K 3.2 today; IV and PO repletion ordered - Continue to monitor closely and replete as needed to maintain K >4.    Follow-up has been arranged and AVS updated.     For questions or updates, please contact [place] Please consult [email or web address] for contact info under  Signed, Abigail Butts, PA-C  03/05/2021, 9:31 AM    Personally seen and examined. Agree with above.   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  Yesterday started midodrine 5 mg 3 times daily.  Holding all antihypertensives.  Currently sinus rhythm.  Previously A. fib.  On Eliquis.  Echo with normal EF 65%. Recent diagnosis of pancreatic cancer, chemotherapy. Orthostatic blood pressures today much improved.  Excellent.  Candee Furbish, MD

## 2021-03-06 ENCOUNTER — Inpatient Hospital Stay (HOSPITAL_COMMUNITY): Payer: Medicare Other

## 2021-03-06 ENCOUNTER — Telehealth: Payer: Self-pay | Admitting: Oncology

## 2021-03-06 DIAGNOSIS — I4891 Unspecified atrial fibrillation: Secondary | ICD-10-CM

## 2021-03-06 DIAGNOSIS — R55 Syncope and collapse: Secondary | ICD-10-CM | POA: Diagnosis not present

## 2021-03-06 DIAGNOSIS — C259 Malignant neoplasm of pancreas, unspecified: Secondary | ICD-10-CM | POA: Diagnosis not present

## 2021-03-06 DIAGNOSIS — I951 Orthostatic hypotension: Secondary | ICD-10-CM | POA: Diagnosis not present

## 2021-03-06 DIAGNOSIS — M7989 Other specified soft tissue disorders: Secondary | ICD-10-CM

## 2021-03-06 LAB — COMPREHENSIVE METABOLIC PANEL
ALT: 51 U/L — ABNORMAL HIGH (ref 0–44)
AST: 45 U/L — ABNORMAL HIGH (ref 15–41)
Albumin: 2.9 g/dL — ABNORMAL LOW (ref 3.5–5.0)
Alkaline Phosphatase: 62 U/L (ref 38–126)
Anion gap: 6 (ref 5–15)
BUN: 12 mg/dL (ref 8–23)
CO2: 22 mmol/L (ref 22–32)
Calcium: 8.1 mg/dL — ABNORMAL LOW (ref 8.9–10.3)
Chloride: 104 mmol/L (ref 98–111)
Creatinine, Ser: 1.16 mg/dL (ref 0.61–1.24)
GFR, Estimated: >60 mL/min (ref >60)
Glucose, Bld: 236 mg/dL — ABNORMAL HIGH (ref 70–99)
Potassium: 3.7 mmol/L (ref 3.5–5.1)
Sodium: 132 mmol/L — ABNORMAL LOW (ref 135–145)
Total Bilirubin: 1.4 mg/dL — ABNORMAL HIGH (ref 0.3–1.2)
Total Protein: 6 g/dL — ABNORMAL LOW (ref 6.5–8.1)

## 2021-03-06 LAB — CBC WITH DIFFERENTIAL/PLATELET
Abs Immature Granulocytes: 0.02 10*3/uL (ref 0.00–0.07)
Basophils Absolute: 0 10*3/uL (ref 0.0–0.1)
Basophils Relative: 0 %
Eosinophils Absolute: 0.1 10*3/uL (ref 0.0–0.5)
Eosinophils Relative: 1 %
HCT: 25.7 % — ABNORMAL LOW (ref 39.0–52.0)
Hemoglobin: 8.8 g/dL — ABNORMAL LOW (ref 13.0–17.0)
Immature Granulocytes: 1 %
Lymphocytes Relative: 9 %
Lymphs Abs: 0.3 10*3/uL — ABNORMAL LOW (ref 0.7–4.0)
MCH: 31.9 pg (ref 26.0–34.0)
MCHC: 34.2 g/dL (ref 30.0–36.0)
MCV: 93.1 fL (ref 80.0–100.0)
Monocytes Absolute: 0.1 10*3/uL (ref 0.1–1.0)
Monocytes Relative: 2 %
Neutro Abs: 3.3 10*3/uL (ref 1.7–7.7)
Neutrophils Relative %: 87 %
Platelets: 97 10*3/uL — ABNORMAL LOW (ref 150–400)
RBC: 2.76 MIL/uL — ABNORMAL LOW (ref 4.22–5.81)
RDW: 14.6 % (ref 11.5–15.5)
WBC: 3.8 10*3/uL — ABNORMAL LOW (ref 4.0–10.5)
nRBC: 0 % (ref 0.0–0.2)

## 2021-03-06 LAB — GLUCOSE, CAPILLARY
Glucose-Capillary: 197 mg/dL — ABNORMAL HIGH (ref 70–99)
Glucose-Capillary: 247 mg/dL — ABNORMAL HIGH (ref 70–99)
Glucose-Capillary: 195 mg/dL — ABNORMAL HIGH (ref 70–99)
Glucose-Capillary: 181 mg/dL — ABNORMAL HIGH (ref 70–99)

## 2021-03-06 LAB — PHOSPHORUS: Phosphorus: 1.8 mg/dL — ABNORMAL LOW (ref 2.5–4.6)

## 2021-03-06 LAB — MAGNESIUM: Magnesium: 1.6 mg/dL — ABNORMAL LOW (ref 1.7–2.4)

## 2021-03-06 MED ORDER — CEPHALEXIN 500 MG PO CAPS
500.0000 mg | ORAL_CAPSULE | Freq: Three times a day (TID) | ORAL | Status: DC
  Administered 2021-03-06: 500 mg via ORAL
  Filled 2021-03-06: qty 1

## 2021-03-06 MED ORDER — MAGNESIUM SULFATE 2 GM/50ML IV SOLN
2.0000 g | Freq: Once | INTRAVENOUS | Status: AC
  Administered 2021-03-06: 2 g via INTRAVENOUS
  Filled 2021-03-06: qty 50

## 2021-03-06 MED ORDER — K PHOS MONO-SOD PHOS DI & MONO 155-852-130 MG PO TABS
500.0000 mg | ORAL_TABLET | Freq: Four times a day (QID) | ORAL | Status: AC
  Administered 2021-03-06 (×3): 500 mg via ORAL
  Filled 2021-03-06 (×4): qty 2

## 2021-03-06 MED ORDER — INSULIN ASPART 100 UNIT/ML ~~LOC~~ SOLN
3.0000 [IU] | Freq: Three times a day (TID) | SUBCUTANEOUS | Status: DC
  Administered 2021-03-07 (×2): 3 [IU] via SUBCUTANEOUS

## 2021-03-06 MED ORDER — SODIUM CHLORIDE 0.9% FLUSH
10.0000 mL | Freq: Two times a day (BID) | INTRAVENOUS | Status: DC
  Administered 2021-03-06 (×2): 10 mL
  Administered 2021-03-07: 20 mL

## 2021-03-06 MED ORDER — CEPHALEXIN 500 MG PO CAPS
500.0000 mg | ORAL_CAPSULE | Freq: Four times a day (QID) | ORAL | Status: DC
  Administered 2021-03-06 – 2021-03-07 (×4): 500 mg via ORAL
  Filled 2021-03-06 (×4): qty 1

## 2021-03-06 MED ORDER — SODIUM CHLORIDE 0.9% FLUSH
10.0000 mL | INTRAVENOUS | Status: DC | PRN
Start: 2021-03-06 — End: 2021-03-07

## 2021-03-06 MED ORDER — POTASSIUM CHLORIDE CRYS ER 20 MEQ PO TBCR
20.0000 meq | EXTENDED_RELEASE_TABLET | Freq: Once | ORAL | Status: AC
  Administered 2021-03-06: 20 meq via ORAL
  Filled 2021-03-06: qty 1

## 2021-03-06 NOTE — Progress Notes (Signed)
Progress Note  Patient Name: [name] Date of Encounter: 03/06/2021  [facility] Cardiologist: Peter [name], MD   Subjective   Feels better no complaints  Inpatient Medications    Scheduled Meds: . apixaban  5 mg Oral BID  . atorvastatin  40 mg Oral QHS  . cephALEXin  500 mg Oral QID  . Chlorhexidine Gluconate Cloth  6 each Topical Daily  . cycloSPORINE  1 drop Both Eyes BID  . feeding supplement  1 Container Oral TID BM  . FLUoxetine  40 mg Oral Daily  . insulin aspart  0-15 Units Subcutaneous TID WC  . insulin aspart  0-5 Units Subcutaneous QHS  . insulin glargine  35 Units Subcutaneous QHS  . magnesium oxide  400 mg Oral BID  . midodrine  5 mg Oral TID WC  . multivitamin with minerals  1 tablet Oral Daily  . pantoprazole  40 mg Oral Daily  . phosphorus  500 mg Oral QID  . sodium chloride flush  3 mL Intravenous Q12H   Continuous Infusions:  PRN Meds: acetaminophen, nitroGLYCERIN, ondansetron **OR** ondansetron (ZOFRAN) IV, phenol   Vital Signs    Vitals:   03/05/21 1345 03/05/21 1919 03/05/21 2001 03/06/21 0424  BP: 129/74  (!) 147/62 108/63  Pulse: 80  91 89  Resp: 15  16 16   Temp: 97.6 F (36.4 C) 98.4 F (36.9 C) 98.2 F (36.8 C) 97.7 F (36.5 C)  TempSrc: Oral Oral Oral Oral  SpO2: 99%  99% 96%  Height:        Intake/Output Summary (Last 24 hours) at 03/06/2021 1347 Last data filed at 03/06/2021 1000 Gross per 24 hour  Intake 1904.29 ml  Output 800 ml  Net 1104.29 ml   Last 3 Weights 02/27/2021 02/21/2021 02/14/2021  Weight (lbs) 283 lb 276 lb 280 lb 11.2 oz  Weight (kg) 128.368 kg 125.193 kg 127.325 kg      Telemetry    Sinus rhythm 85- Personally Reviewed  ECG    No new- Personally Reviewed  Physical Exam   GEN: No acute distress.   Neck: No JVD Cardiac: RRR, no murmurs, rubs, or gallops.  Respiratory: Clear to auscultation bilaterally. GI: Soft, nontender, non-distended  MS: No edema; No deformity. Neuro:   Nonfocal  Psych: Normal affect   Labs    High Sensitivity Troponin:   Recent Labs  Lab 03/04/21 0434 03/04/21 0615  TROPONINIHS 44* 40*      Chemistry Recent Labs  Lab 03/04/21 0434 03/04/21 2340 03/05/21 0500 03/06/21 0541  NA 133* 131* 131* 132*  K 3.3* 3.2* 3.2* 3.7  CL 102 100 100 104  CO2 24 24 24 22   GLUCOSE 138* 199* 121* 236*  BUN 19 18 17 12   CREATININE 1.17 1.19 1.17 1.16  CALCIUM 8.2* 8.2* 8.4* 8.1*  PROT 6.2*  --  6.4* 6.0*  ALBUMIN 3.1*  --  3.2* 2.9*  AST 81*  --  67* 45*  ALT 50*  --  64* 51*  ALKPHOS 50  --  55 62  BILITOT 2.5*  --  1.6* 1.4*  GFRNONAA >60 >60 >60 >60  ANIONGAP 7 7 7 6      Hematology Recent Labs  Lab 03/04/21 0434 03/05/21 0500 03/06/21 0541  WBC 7.7 5.8 3.8*  RBC 3.28* 2.95* 2.76*  HGB 10.5* 9.4* 8.8*  HCT 30.2* 27.4* 25.7*  MCV 92.1 92.9 93.1  MCH 32.0 31.9 31.9  MCHC 34.8 34.3 34.2  RDW 14.4 14.6 14.6  PLT 104* 100* 97*    BNPNo results for input(s): BNP, PROBNP in the last 168 hours.   DDimer  Recent Labs  Lab 03/04/21 1257  DDIMER 2.20*     Radiology    CT ANGIO CHEST PE W OR WO CONTRAST  Result Date: 03/05/2021 CLINICAL DATA:  75 year old male with fever, syncope following 1st chemotherapy for pancreatic cancer. Abnormal D-dimer. EXAM: CT ANGIOGRAPHY CHEST WITH CONTRAST TECHNIQUE: Multidetector CT imaging of the chest was performed using the standard protocol during bolus administration of intravenous contrast. Multiplanar CT image reconstructions and MIPs were obtained to evaluate the vascular anatomy. CONTRAST:  181mL OMNIPAQUE IOHEXOL 350 MG/ML SOLN COMPARISON:  Portable chest yesterday. Staging CT Chest, Abdomen, and Pelvis 01/29/2021. FINDINGS: Cardiovascular: Adequate contrast bolus timing in the pulmonary arterial tree. Intermittent mild respiratory motion in the lower lungs. No focal filling defect identified in the pulmonary arteries to suggest acute pulmonary embolism. Sequelae of CABG. Calcified  aortic atherosclerosis. Borderline to mild cardiomegaly. No pericardial effusion. Right chest Port-A-Cath now in place. Mediastinum/Nodes: Negative.  No lymphadenopathy. Lungs/Pleura: Stable lung volumes from last month. Mild atelectatic changes to the major airways today which remain patent. Mild motion artifact at the lung bases. Occasional atelectasis and scarring in both lungs appears stable. No pleural effusion or acute pulmonary opacity. Upper Abdomen: Stable visible liver, spleen and stomach. Absent gallbladder. Negative visible splenic flexure. Pancreas not included. Musculoskeletal: Prior sternotomy. Stable visualized osseous structures. Review of the MIP images confirms the above findings. IMPRESSION: 1. No evidence of acute pulmonary embolus. 2. Stable chest from last month.  No acute or metastatic findings. 3. Aortic Atherosclerosis (ICD10-I70.0). Electronically Signed   By: Genevie Ann M.D.   On: 03/05/2021 05:29   VAS [place] LOWER EXTREMITY VENOUS (DVT)  Result Date: 03/05/2021  Lower Venous DVT Study Indications: Swelling.  Risk Factors: None identified. Limitations: Poor ultrasound/tissue interface and body habitus. Comparison Study: No prior studies. Performing Technologist: Oliver Hum RVT  Examination Guidelines: A complete evaluation includes B-mode imaging, spectral Doppler, color Doppler, and power Doppler as needed of all accessible portions of each vessel. Bilateral testing is considered an integral part of a complete examination. Limited examinations for reoccurring indications may be performed as noted. The reflux portion of the exam is performed with the patient in reverse Trendelenburg.  +---------+---------------+---------+-----------+----------+--------------+ RIGHT    CompressibilityPhasicitySpontaneityPropertiesThrombus Aging +---------+---------------+---------+-----------+----------+--------------+ CFV      Full           Yes      Yes                                  +---------+---------------+---------+-----------+----------+--------------+ SFJ      Full                                                        +---------+---------------+---------+-----------+----------+--------------+ FV Prox  Full                                                        +---------+---------------+---------+-----------+----------+--------------+ FV Mid   Full                                                        +---------+---------------+---------+-----------+----------+--------------+  FV DistalFull                                                        +---------+---------------+---------+-----------+----------+--------------+ PFV      Full                                                        +---------+---------------+---------+-----------+----------+--------------+ POP      Full           Yes      Yes                                 +---------+---------------+---------+-----------+----------+--------------+ PTV      Full                                                        +---------+---------------+---------+-----------+----------+--------------+ PERO     Full                                                        +---------+---------------+---------+-----------+----------+--------------+   +----+---------------+---------+-----------+----------+--------------+ LEFTCompressibilityPhasicitySpontaneityPropertiesThrombus Aging +----+---------------+---------+-----------+----------+--------------+ CFV Full           Yes      Yes                                 +----+---------------+---------+-----------+----------+--------------+     Summary: RIGHT: - There is no evidence of deep vein thrombosis in the lower extremity.  - No cystic structure found in the popliteal fossa.  LEFT: - No evidence of common femoral vein obstruction.  *See table(s) above for measurements and observations. Electronically signed by Deitra Mayo MD on 03/05/2021 at 3:46:13 PM.    Final    VAS [place] UPPER EXTREMITY VENOUS DUPLEX  Result Date: 03/06/2021 UPPER VENOUS STUDY  Indications: Swelling Comparison Study: no prior Performing Technologist: Abram Sander RVS  Examination Guidelines: A complete evaluation includes B-mode imaging, spectral Doppler, color Doppler, and power Doppler as needed of all accessible portions of each vessel. Bilateral testing is considered an integral part of a complete examination. Limited examinations for reoccurring indications may be performed as noted.  Right Findings: +----------+------------+---------+-----------+----------+-------+ RIGHT     CompressiblePhasicitySpontaneousPropertiesSummary +----------+------------+---------+-----------+----------+-------+ IJV           Full       Yes       Yes                      +----------+------------+---------+-----------+----------+-------+ Subclavian    Full       Yes       Yes                      +----------+------------+---------+-----------+----------+-------+ Axillary  Full       Yes       Yes                      +----------+------------+---------+-----------+----------+-------+ Brachial      Full                                          +----------+------------+---------+-----------+----------+-------+ Radial        Full                                          +----------+------------+---------+-----------+----------+-------+ Ulnar         Full                                          +----------+------------+---------+-----------+----------+-------+ Cephalic      Full                                          +----------+------------+---------+-----------+----------+-------+ Basilic       Full                                          +----------+------------+---------+-----------+----------+-------+  Summary:  Right: No evidence of deep vein thrombosis in the upper extremity. No evidence of  superficial vein thrombosis in the upper extremity. No evidence of thrombosis in the subclavian.  *See table(s) above for measurements and observations.    Preliminary     Cardiac Studies   ECHO normal EF  Patient Profile     75 y.o. male with orthostatic hypotension.  New onset atrial fibrillation. PMH of CAD s/p CABG in 2004, HTN, HLD, DM type 2, CKD stage3a, OSA on CPAP, GERD, and recently diagnosed pancreatic cancer 1/2022who is being followed by cardiology for the evaluation of new onset atrial fibrillation and syncope  Assessment & Plan    Atrial fibrillation -Eliquis -Currently normal sinus rhythm well rate controlled.  Orthostatic hypotension -Quite severe.  Stopped all antihypertensives. -On midodrine 5 mg 3 times daily.  We will go ahead and sign off.  Please let us know we can be of further assistance.    For questions or updates, please contact [place] Please consult [email or web address] for contact info under        Signed, Candee Furbish, MD  03/06/2021, 1:47 PM

## 2021-03-06 NOTE — Telephone Encounter (Signed)
R/s 3/24 appts per patient request. Called and spoke with patients wife. Confirmed new date and time

## 2021-03-06 NOTE — Progress Notes (Signed)
PHARMACY NOTE:  ANTIMICROBIAL RENAL DOSAGE ADJUSTMENT  Current antimicrobial regimen includes a mismatch between antimicrobial dosage and estimated renal function.  As per policy approved by the Pharmacy & Therapeutics and Medical Executive Committees, the antimicrobial dosage will be adjusted accordingly.  Current antimicrobial dosage:  Keflex 500 mg q8h  Indication: right arm cellultis  Renal Function:  Estimated Creatinine Clearance: 73.9 mL/min (by C-G formula based on SCr of 1.16 mg/dL). []      On intermittent HD, scheduled: []      On CRRT    Antimicrobial dosage has been changed to:  Keflex 500 mg q6h     Thank you for allowing pharmacy to be a part of this patient's care.  Lynelle Doctor, [facility] 03/06/2021 10:19 AM

## 2021-03-06 NOTE — Progress Notes (Signed)
PROGRESS NOTE    [name]  ZOX:[id number] DOB: 12/15/1946 DOA: 03/04/2021 PCP: [place], [name] G, MD   Chief Complaint  Patient presents with  . Loss of Consciousness    Brief Narrative:  [name] 75 y.o.malewith medical history significant ofCAD s/p CABG, HTN, HLD, recent diagnosis of pancreatic cancer presenting with [name] syncopal episode. He states his symptoms started 1 day PTA, he felt some chills and aches, noted temp of 100.5. The morning PTA, he went to the bathroom, began to feel weak, so he thought he would sit down. He states the next thing he remembers is his family standing over him. He states that they told him they heard [name] loud crash and found him hanging over his walker. They laid him on the ground. It did not appear that he had any head injury. He apparently was out just [name] few minutes and confused for [name] few minutes more. His family called for EMS to bring him to the ED. Of note, he reports feeling Bryar Rennie "pinprick" pain in his left chest and flutteringafter he regained consciousness. He says it wasn't intense, but it happen 4 times athome and once in the ED. He's not had either of these symptoms before.  Of note, he was recently seen by his cardiologist on 02/27/2021 and was cleared for [name] placement as well as for future pancreatic surgery.  Patient recently had his first chemotherapy infusion on 03/01/2021 for pancreatic cancer. In the ED, CT head was negative. CXR was negative. COVID was negative. Trp were mildly elevated. He was found to be in afib, also noted positive orthostatic hypotension.  TRH was called for admission. Cardiology was consulted for new afib.  Assessment & Plan:   Active Problems:   Syncope  Syncope likely 2/2 orthostatic hypotension (DM neuropathy) Repeat orthostatic vitals negative CT head negative Echo done on 03/04/2021 showed EF of 60 to 65%, no regional wall motion abnormalities, left ventricular diastolic  parameters are indeterminate Cardiology consulted, started patient on midodrine - continue 5 mg TID.  Antihypertensives have been d/c'd. Continue midodrine Hold home losartan, metoprolol, Imdur Daily orthostatic vitals  New onset paroxysmal [name]. Fib Currently rate controlled TSH 1.389 Echo as above Held home metoprolol due to orthostatic hypotension Started on Eliquis Monitor closely  Mildly elevated troponin CAD s/p CABG in 2004 HTN/HLD Troponin with [name] flat trend, not consistent with ACS EKG no acute ST changes Echo as above DC aspirin due to Eliquis, continue to hold Imdur, metoprolol, losartan Continue statins Follow with cardiology outpatient   Hallucinations Noted today per nursing.  Keflex occasionally associated with hallucinations.  I don't see other obvious contributing meds.  Will discuss with pharmacy.  Delirium precautions  Fever Cellulitis Erythema to right upper extremity, cellulitis vs drug reaction? No fever spikes since admission, no leukocytosis - had repeat blood cx last night 2/2 "chills" Possibly related to chemotherapy, rule out any infection UA unremarkable, UC <10,00 insignificant growth, BC x2 NGTD Follow repeat blood cx Chest x-ray unremarkable Monitor fever curve, low threshold to start antibiotics Started on keflex today as noted before  Mild transaminitis May be related to recent chemotherapy/cancer Trend daily, may need to hold statins, continue to follow  Hyponatremia/hypokalemia/hypophosphatemia Replace and follow Continue IV fluids  Elevated D-dimer Right Sided Swelling  CTA chest negative for PE Right upper and lower dopplers negative for DVT  Diabetes mellitus type 2 with neuropathy Last A1c 6.3 on 02/2021 Continue SSI, Accu-Cheks, Lantus, mealtime - adjust, follow  Normocytic anemia/thrombocytopenia Likely 2/2 possibly recent chemotherapy use Downtrending, continue to monitor Follow anemia labs  Obesity Lifestyle  modification advised  Estimated body mass index is 39.47 kg/m as calculated from the following:   Height as of this encounter: 5\' 11"  (1.803 m).   Weight as of 02/27/21: 128.4 kg.   DVT prophylaxis: eliquis Code Status: full  Family Communication: none at bedside Disposition:   Status is: Inpatient  Remains inpatient appropriate because:Inpatient level of care appropriate due to severity of illness   Dispo: The patient is from: Home              Anticipated d/c is to: Home              Patient currently is not medically stable to d/c.   Difficult to place patient No       Consultants:   Oncology  cardiology  Procedures:  Upper extremity US Summary:    Right:  No evidence of deep vein thrombosis in the upper extremity. No evidence of  superficial vein thrombosis in the upper extremity. No evidence of  thrombosis in  the subclavian.   LE [place] Summary:  RIGHT:  - There is no evidence of deep vein thrombosis in the lower extremity.    - No cystic structure found in the popliteal fossa.    LEFT:  - No evidence of common femoral vein obstruction.    Echo IMPRESSIONS    1. Left ventricular ejection fraction, by estimation, is 60 to 65%. The  left ventricle has normal function. The left ventricle has no regional  wall motion abnormalities. Left ventricular diastolic parameters are  indeterminate.  2. Right ventricular systolic function is normal. The right ventricular  size is normal. There is normal pulmonary artery systolic pressure.  3. The mitral valve is normal in structure. No evidence of mitral valve  regurgitation.  4. The aortic valve is normal in structure. Aortic valve regurgitation is  not visualized.  5. The inferior vena cava is normal in size with greater than 50%  respiratory variability, suggesting right atrial pressure of 3 mmHg. Antimicrobials:  Anti-infectives (From admission, onward)   Start     Dose/Rate Route Frequency  Ordered Stop   03/06/21 1400  cephALEXin (KEFLEX) capsule 500 mg        500 mg Oral 4 times daily 03/06/21 1020     03/06/21 0930  cephALEXin (KEFLEX) capsule 500 mg  Status:  Discontinued        500 mg Oral Every 8 hours 03/06/21 0836 03/06/21 1020         Subjective: No new complaints today Noticed right arm swelling Sunday, was on leg too No lightheadedness since being here  Objective: Vitals:   03/05/21 2001 03/06/21 0424 03/06/21 1419 03/06/21 1420  BP: (!) 147/62 108/63  (!) 130/91  Pulse: 91 89 83 82  Resp: 16 16 14 14   Temp: 98.2 F (36.8 C) 97.7 F (36.5 C) 98.3 F (36.8 C) 98.3 F (36.8 C)  TempSrc: Oral Oral Oral Oral  SpO2: 99% 96% 96% 100%  Height:        Intake/Output Summary (Last 24 hours) at 03/06/2021 1637 Last data filed at 03/06/2021 1000 Gross per 24 hour  Intake 1904.29 ml  Output 800 ml  Net 1104.29 ml   There were no vitals filed for this visit.  Examination:  General exam: Appears calm and comfortable  Respiratory system: Clear to auscultation. Respiratory effort normal. Cardiovascular system: S1 &  S2 heard, RRR.  Gastrointestinal system: Abdomen is nondistended, soft and nontender. Central nervous system: Alert and oriented. No focal neurological deficits. Extremities: mild erythema and edema to Right upper and lower extremity Skin: No rashes, lesions or ulcers Psychiatry: Judgement and insight appear normal. Mood & affect appropriate.     Data Reviewed: I have personally reviewed following labs and imaging studies  CBC: Recent Labs  Lab 03/01/21 1228 03/04/21 0434 03/05/21 0500 03/06/21 0541  WBC 6.8 7.7 5.8 3.8*  NEUTROABS 5.0 7.2  --  3.3  HGB 11.1* 10.5* 9.4* 8.8*  HCT 32.7* 30.2* 27.4* 25.7*  MCV 92.6 92.1 92.9 93.1  PLT 163 104* 100* 97*    Basic Metabolic Panel: Recent Labs  Lab 03/01/21 1228 03/04/21 0434 03/04/21 0615 03/04/21 2340 03/05/21 0500 03/06/21 0541  NA 136 133*  --  131* 131* 132*  K 3.5 3.3*   --  3.2* 3.2* 3.7  CL 104 102  --  100 100 104  CO2 26 24  --  24 24 22   GLUCOSE 181* 138*  --  199* 121* 236*  BUN 21 19  --  18 17 12   CREATININE 1.42* 1.17  --  1.19 1.17 1.16  CALCIUM 8.9 8.2*  --  8.2* 8.4* 8.1*  MG  --   --  1.5*  --  1.8 1.6*  PHOS  --   --   --   --  1.7* 1.8*    GFR: Estimated Creatinine Clearance: 73.9 mL/min (by C-G formula based on SCr of 1.16 mg/dL).  Liver Function Tests: Recent Labs  Lab 03/01/21 1228 03/04/21 0434 03/05/21 0500 03/06/21 0541  AST 25 81* 67* 45*  ALT 19 50* 64* 51*  ALKPHOS 69 50 55 62  BILITOT 1.0 2.5* 1.6* 1.4*  PROT 7.0 6.2* 6.4* 6.0*  ALBUMIN 3.6 3.1* 3.2* 2.9*    CBG: Recent Labs  Lab 03/05/21 1206 03/05/21 1646 03/05/21 2003 03/06/21 0759 03/06/21 1224  GLUCAP 189* 165* 140* 197* 247*     Recent Results (from the past 240 hour(s))  Blood culture (routine x 2)     Status: None (Preliminary result)   Collection Time: 03/04/21  4:34 AM   Specimen: BLOOD  Result Value Ref Range Status   Specimen Description   Final    BLOOD LEFT ARM Performed at [facility], [address] [place], [place] [address]    Special Requests   Final    BOTTLES DRAWN AEROBIC AND ANAEROBIC Blood Culture results may not be optimal due to an inadequate volume of blood received in culture bottles Performed at [facility], [address] [place], [place] [address]    Culture   Final    NO GROWTH 2 DAYS Performed at [facility], [address], [place], [place] [address]    Report Status PENDING  Incomplete  Blood culture (routine x 2)     Status: None (Preliminary result)   Collection Time: 03/04/21  5:03 AM   Specimen: BLOOD  Result Value Ref Range Status   Specimen Description   Final    BLOOD RIGHT ANTECUBITAL Performed at [facility], [address], [place], [place] [address]    Special Requests   Final    BOTTLES DRAWN AEROBIC AND ANAEROBIC Blood Culture  adequate volume Performed at [facility], [address] [place], [place] [address]    Culture   Final    NO GROWTH 2  DAYS Performed at [facility], [address], [place], [place] [address]    Report Status PENDING  Incomplete  Resp Panel by RT-PCR (Flu Adrick Kestler&B, Covid) Nasopharyngeal Swab     Status: None   Collection Time: 03/04/21  6:08 AM   Specimen: Nasopharyngeal Swab; Nasopharyngeal(NP) swabs in vial transport medium  Result Value Ref Range Status   SARS Coronavirus 2 by RT PCR NEGATIVE NEGATIVE Final    Comment: (NOTE) SARS-CoV-2 target nucleic acids are NOT DETECTED.  The SARS-CoV-2 RNA is generally detectable in upper respiratory specimens during the acute phase of infection. The lowest concentration of SARS-CoV-2 viral copies this assay can detect is 138 copies/mL. Kateline Kinkade negative result does not preclude SARS-Cov-2 infection and should not be used as the sole basis for treatment or other patient management decisions. Eileene Kisling negative result may occur with  improper specimen collection/handling, submission of specimen other than nasopharyngeal swab, presence of viral mutation(s) within the areas targeted by this assay, and inadequate number of viral copies(<138 copies/mL). Rober Skeels negative result must be combined with clinical observations, patient history, and epidemiological information. The expected result is Negative.  Fact Sheet for Patients:  BloggerCourse.com  Fact Sheet for Healthcare Providers:  SeriousBroker.it  This test is no t yet approved or cleared by the [place] FDA and  has been authorized for detection and/or diagnosis of SARS-CoV-2 by FDA under an Emergency Use Authorization (EUA). This EUA will remain  in effect (meaning this test can be used) for the duration of the COVID-19 declaration under Section 564(b)(1) of the Act, 21 U.S.C.section 360bbb-3(b)(1), unless the authorization is  terminated  or revoked sooner.       Influenza Ellen Mayol by PCR NEGATIVE NEGATIVE Final   Influenza B by PCR NEGATIVE NEGATIVE Final    Comment: (NOTE) The Xpert Xpress SARS-CoV-2/FLU/RSV plus assay is intended as an aid in the diagnosis of influenza from Nasopharyngeal swab specimens and should not be used as Malaijah Houchen sole basis for treatment. Nasal washings and aspirates are unacceptable for Xpert Xpress SARS-CoV-2/FLU/RSV testing.  Fact Sheet for Patients: BloggerCourse.com  Fact Sheet for Healthcare Providers: SeriousBroker.it  This test is not yet approved or cleared by the [place] FDA and has been authorized for detection and/or diagnosis of SARS-CoV-2 by FDA under an Emergency Use Authorization (EUA). This EUA will remain in effect (meaning this test can be used) for the duration of the COVID-19 declaration under Section 564(b)(1) of the Act, 21 U.S.C. section 360bbb-3(b)(1), unless the authorization is terminated or revoked.  Performed at [facility], [address], [place], [place] [address]   Urine Culture     Status: Abnormal   Collection Time: 03/04/21  7:50 AM   Specimen: Urine, Clean Catch  Result Value Ref Range Status   Specimen Description   Final    URINE, CLEAN CATCH Performed at [facility], [address], [place], [place] [address]    Special Requests   Final    NONE Performed at [facility], [address] [place], [place] [address]    Culture (Lanice Folden)  Final    <10,000 COLONIES/mL INSIGNIFICANT GROWTH Performed at [facility], [address], [place], [place] [address]    Report Status 03/05/2021 FINAL  Final         Radiology Studies: CT ANGIO CHEST PE W OR WO CONTRAST  Result Date: 03/05/2021 CLINICAL DATA:  75 year old male with fever, syncope following 1st chemotherapy for pancreatic cancer.  Abnormal D-dimer. EXAM: CT ANGIOGRAPHY  CHEST WITH CONTRAST TECHNIQUE: Multidetector CT imaging of the chest was performed using the standard protocol during bolus administration of intravenous contrast. Multiplanar CT image reconstructions and MIPs were obtained to evaluate the vascular anatomy. CONTRAST:  OMNIPAQUE IOHEXOL 350 MG/ML SOLN COMPARISON:  Portable chest yesterday. Staging CT Chest, Abdomen, and Pelvis 01/29/2021. FINDINGS: Cardiovascular: Adequate contrast bolus timing in the pulmonary arterial tree. Intermittent mild respiratory motion in the lower lungs. No focal filling defect identified in the pulmonary arteries to suggest acute pulmonary embolism. Sequelae of CABG. Calcified aortic atherosclerosis. Borderline to mild cardiomegaly. No pericardial effusion. Right chest Port-Nazir[place] Tri-Cath now in place. Mediastinum/Nodes: Negative.  No lymphadenopathy. Lungs/Pleura: Stable lung volumes from last month. Mild atelectatic changes to the major airways today which remain patent. Mild motion artifact at the lung bases. Occasional atelectasis and scarring in both lungs appears stable. No pleural effusion or acute pulmonary opacity. Upper Abdomen: Stable visible liver, spleen and stomach. Absent gallbladder. Negative visible splenic flexure. Pancreas not included. Musculoskeletal: Prior sternotomy. Stable visualized osseous structures. Review of the MIP images confirms the above findings. IMPRESSION: 1. No evidence of acute pulmonary embolus. 2. Stable chest from last month.  No acute or metastatic findings. 3. Aortic Atherosclerosis (ICD10-I70.0). Electronically Signed   By: Odessa [name] M.D.   On: 03/05/2021 05:29   VAS [place] LOWER EXTREMITY VENOUS (DVT)  Result Date: 03/05/2021  Lower Venous DVT Study Indications: Swelling.  Risk Factors: None identified. Limitations: Poor ultrasound/tissue interface and body habitus. Comparison Study: No prior studies. Performing Technologist: Chanda Busing RVT  Examination Guidelines: Fay Swider complete evaluation  includes B-mode imaging, spectral Doppler, color Doppler, and power Doppler as needed of all accessible portions of each vessel. Bilateral testing is considered an integral part of Genoveva Singleton complete examination. Limited examinations for reoccurring indications may be performed as noted. The reflux portion of the exam is performed with the patient in reverse Trendelenburg.  +---------+---------------+---------+-----------+----------+--------------+ RIGHT    CompressibilityPhasicitySpontaneityPropertiesThrombus Aging +---------+---------------+---------+-----------+----------+--------------+ CFV      Full           Yes      Yes                                 +---------+---------------+---------+-----------+----------+--------------+ SFJ      Full                                                        +---------+---------------+---------+-----------+----------+--------------+ FV Prox  Full                                                        +---------+---------------+---------+-----------+----------+--------------+ FV Mid   Full                                                        +---------+---------------+---------+-----------+----------+--------------+ FV DistalFull                                                        +---------+---------------+---------+-----------+----------+--------------+  PFV      Full                                                        +---------+---------------+---------+-----------+----------+--------------+ POP      Full           Yes      Yes                                 +---------+---------------+---------+-----------+----------+--------------+ PTV      Full                                                        +---------+---------------+---------+-----------+----------+--------------+ PERO     Full                                                         +---------+---------------+---------+-----------+----------+--------------+   +----+---------------+---------+-----------+----------+--------------+ LEFTCompressibilityPhasicitySpontaneityPropertiesThrombus Aging +----+---------------+---------+-----------+----------+--------------+ CFV Full           Yes      Yes                                 +----+---------------+---------+-----------+----------+--------------+     Summary: RIGHT: - There is no evidence of deep vein thrombosis in the lower extremity.  - No cystic structure found in the popliteal fossa.  LEFT: - No evidence of common femoral vein obstruction.  *See table(s) above for measurements and observations. Electronically signed by Waverly Ferrari MD on 03/05/2021 at 3:46:13 PM.    Final    VAS [place] UPPER EXTREMITY VENOUS DUPLEX  Result Date: 03/06/2021 UPPER VENOUS STUDY  Indications: Swelling Comparison Study: no prior Performing Technologist: Blanch Media RVS  Examination Guidelines: Kento Gossman complete evaluation includes B-mode imaging, spectral Doppler, color Doppler, and power Doppler as needed of all accessible portions of each vessel. Bilateral testing is considered an integral part of Laurella Tull complete examination. Limited examinations for reoccurring indications may be performed as noted.  Right Findings: +----------+------------+---------+-----------+----------+-------+ RIGHT     CompressiblePhasicitySpontaneousPropertiesSummary +----------+------------+---------+-----------+----------+-------+ IJV           Full       Yes       Yes                      +----------+------------+---------+-----------+----------+-------+ Subclavian    Full       Yes       Yes                      +----------+------------+---------+-----------+----------+-------+ Axillary      Full       Yes       Yes                      +----------+------------+---------+-----------+----------+-------+ Brachial      Full                                           +----------+------------+---------+-----------+----------+-------+  Radial        Full                                          +----------+------------+---------+-----------+----------+-------+ Ulnar         Full                                          +----------+------------+---------+-----------+----------+-------+ Cephalic      Full                                          +----------+------------+---------+-----------+----------+-------+ Basilic       Full                                          +----------+------------+---------+-----------+----------+-------+  Summary:  Right: No evidence of deep vein thrombosis in the upper extremity. No evidence of superficial vein thrombosis in the upper extremity. No evidence of thrombosis in the subclavian.  *See table(s) above for measurements and observations.  Diagnosing physician: Gretta Began MD Electronically signed by Gretta Began MD on 03/06/2021 at 2:09:29 PM.    Final         Scheduled Meds: . apixaban  5 mg Oral BID  . atorvastatin  40 mg Oral QHS  . cephALEXin  500 mg Oral QID  . Chlorhexidine Gluconate Cloth  6 each Topical Daily  . cycloSPORINE  1 drop Both Eyes BID  . feeding supplement  1 Container Oral TID BM  . FLUoxetine  40 mg Oral Daily  . insulin aspart  0-15 Units Subcutaneous TID WC  . insulin aspart  0-5 Units Subcutaneous QHS  . insulin glargine  35 Units Subcutaneous QHS  . magnesium oxide  400 mg Oral BID  . midodrine  5 mg Oral TID WC  . multivitamin with minerals  1 tablet Oral Daily  . pantoprazole  40 mg Oral Daily  . phosphorus  500 mg Oral QID  . sodium chloride flush  10-40 mL Intracatheter Q12H  . sodium chloride flush  3 mL Intravenous Q12H   Continuous Infusions:   LOS: 1 day    Time spent: over 30 min    Lacretia Nicks, MD [facility]   To contact the attending provider between 7A-7P or the covering provider during after hours 7P-7A, please  log into the web site [email or web address] and access using universal [place] password for that web site. If you do not have the password, please call the hospital operator.  03/06/2021, 4:37 PM

## 2021-03-06 NOTE — Evaluation (Signed)
Physical Therapy Evaluation Patient Details Name: [name] MRN: [id number] DOB: Mar 23, 1946 Today's Date: 03/06/2021   History of Present Illness  Patient is a 75 year old male with PMH of CAD s/p CABG, HTN, HLD. Presenting with a syncopal episodes. Patient had first chemotherapy infusion on 03/01/2021 for pancreatic cancer.  Clinical Impression  The patient  Reports no dizziness today, does report had a hallucination this AM. Patient reports history of dizziness. Patient ambulated x 180 using Rw.  patient reports that his rollator, which he got from [place], broke when he fell at home. Patient does need another Rw of some sort for DC home.  Patient's orthostatic BP stable.  Pt admitted with above diagnosis.  Pt currently with functional limitations due to the deficits listed below (see PT Problem List). Pt will benefit from skilled PT to increase their independence and safety with mobility to allow discharge to the venue listed below.       Follow Up Recommendations No PT follow up    Equipment Recommendations   (rollator, his broke when fell.) or a 2 wheeled- he had gotten his from [place].   Recommendations for Other Services       Precautions / Restrictions Precautions Precautions: Fall Precaution Comments: monitor BP, negative orthostatics on 3/23 Restrictions Weight Bearing Restrictions: No      Mobility  Bed Mobility Overal bed mobility: Needs Assistance Bed Mobility: Supine to Sit     Supine to sit: HOB elevated;Min guard     General bed mobility comments: min G for safety    Transfers Overall transfer level: Needs assistance Equipment used: Rolling walker (2 wheeled) Transfers: Sit to/from Stand Sit to Stand: Min guard;Min assist;From elevated surface         General transfer comment: initial stand patient min A for stability, improved balance with continued ambulation with walker  Ambulation/Gait Ambulation/Gait assistance: Min guard Gait Distance  (Feet): 180 Feet Assistive device: Rolling walker (2 wheeled) Gait Pattern/deviations: Step-through pattern Gait velocity: decreased`   General Gait Details: gait slow and steady, no dizziness reported.  Stairs            Wheelchair Mobility    Modified Rankin (Stroke Patients Only)       Balance Overall balance assessment: Needs assistance Sitting-balance support: Feet supported Sitting balance-Leahy Scale: Good     Standing balance support: Single extremity supported Standing balance-Leahy Scale: Fair Standing balance comment: reliant on external support                             Pertinent Vitals/Pain Pain Assessment: No/denies pain    Home Living Family/patient expects to be discharged to:: Private residence Living Arrangements: Spouse/significant other;Children Available Help at Discharge: Family Type of Home: House Home Access: Stairs to enter Entrance Stairs-Rails: Right Entrance Stairs-Number of Steps: 3 Home Layout: Two level;Able to live on main level with bedroom/bathroom Home Equipment: [name] - single point (broke his 4WW after recent fall)      Prior Function Level of Independence: Independent with assistive device(s)         Comments: started using rollator recently after cancer diagnosis, prior to that used cane     Hand Dominance   Dominant Hand: Right    Extremity/Trunk Assessment   Upper Extremity Assessment Upper Extremity Assessment: Overall WFL for tasks assessed    Lower Extremity Assessment Lower Extremity Assessment: Overall WFL for tasks assessed    Cervical / Trunk Assessment  Cervical / Trunk Assessment: Normal  Communication   Communication: No difficulties  Cognition Arousal/Alertness: Awake/alert Behavior During Therapy: WFL for tasks assessed/performed Overall Cognitive Status: Within Functional Limits for tasks assessed                                 General Comments: patient does  report to OT that this morning he thought he saw worms on the ceiling for a split second, has also felt like someone was in the room with him even though he knew there wasn't. Told patient to inform MD about this, patient states started just within past day.      General Comments      Exercises     Assessment/Plan    PT Assessment Patient needs continued PT services  PT Problem List Decreased strength;Decreased mobility;Decreased activity tolerance;Decreased knowledge of use of DME;Decreased knowledge of precautions       PT Treatment Interventions DME instruction;Therapeutic activities;Gait training;Therapeutic exercise;Patient/family education;Functional mobility training    PT Goals (Current goals can be found in the Care Plan section)  Acute Rehab PT Goals Patient Stated Goal: regain IND, get a new $ wheeled RW PT Goal Formulation: With patient Time For Goal Achievement: 03/19/21 Potential to Achieve Goals: Good    Frequency Min 3X/week   Barriers to discharge        Co-evaluation               AM-PAC PT "6 Clicks" Mobility  Outcome Measure Help needed turning from your back to your side while in a flat bed without using bedrails?: A Little Help needed moving from lying on your back to sitting on the side of a flat bed without using bedrails?: A Little Help needed moving to and from a bed to a chair (including a wheelchair)?: A Little Help needed standing up from a chair using your arms (e.g., wheelchair or bedside chair)?: A Little Help needed to walk in hospital room?: A Little Help needed climbing 3-5 steps with a railing? : A Little 6 Click Score: 18    End of Session Equipment Utilized During Treatment: Gait belt Activity Tolerance: Patient tolerated treatment well Patient left: in chair;with call bell/phone within reach;with chair alarm set Nurse Communication: Mobility status PT Visit Diagnosis: Muscle weakness (generalized) (M62.81);Difficulty in  walking, not elsewhere classified (R26.2)    Time: 7829-5621 PT Time Calculation (min) (ACUTE ONLY): 25 min   Charges:   PT Evaluation $PT Eval Low Complexity: [facility] Pager [phone] Office [phone]   Claretha Cooper 03/06/2021, 12:31 PM

## 2021-03-06 NOTE — Progress Notes (Addendum)
HEMATOLOGY-ONCOLOGY PROGRESS NOTE  SUBJECTIVE: Overall feels better. Had chills last night, denies fever. He thinks right arm has stable redness and swelling.   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  Intake/Output from previous day: 03/22 0701 - 03/23 0700 In: 1904.3 [P.O.:470; I.V.:927.3; IV Piggyback:507] Out: 1700 [Urine:1700]  GENERAL:alert, no distress and comfortable SKIN: Erythema/edema posterior right forearm, somewhat demarcated, ? Palpable cord. EYES: normal, Conjunctiva are pink and non-injected, sclera clear OROPHARYNX: no thrush  HEART: regular rate and rhythm ABDOMEN:abdomen soft, non-tender and normal bowel sounds NEURO: alert & oriented x 3 with fluent speech, no focal motor/sensory deficits Vascular: right lower leg edema and erythema Port-A-Cath without erythema  LABORATORY DATA:  I have reviewed the data as listed CMP Latest Ref Rng & Units 03/06/2021 03/05/2021 03/04/2021  Glucose 70 - 99 mg/dL 236(H) 121(H) 199(H)  BUN 8 - 23 mg/dL 12 17 18   Creatinine 0.61 - 1.24 mg/dL 1.16 1.17 1.19  Sodium 135 - 145 mmol/L 132(L) 131(L) 131(L)  Potassium 3.5 - 5.1 mmol/L 3.7 3.2(L) 3.2(L)  Chloride 98 - 111 mmol/L 104 100 100  CO2 22 - 32 mmol/L 22 24 24   Calcium 8.9 - 10.3 mg/dL 8.1(L) 8.4(L) 8.2(L)  Total Protein 6.5 - 8.1 g/dL 6.0(L) 6.4(L) -  Total Bilirubin 0.3 - 1.2  mg/dL 1.4(H) 1.6(H) -  Alkaline Phos 38 - 126 U/L 62 55 -  AST 15 - 41 U/L 45(H) 67(H) -  ALT 0 - 44 U/L 51(H) 64(H) -    Lab Results  Component Value Date   WBC 3.8 (L) 03/06/2021   HGB 8.8 (L) 03/06/2021   HCT 25.7 (L) 03/06/2021   MCV 93.1 03/06/2021   PLT 97 (L) 03/06/2021   NEUTROABS 3.3 03/06/2021    CT Head Wo Contrast  Result Date: 03/04/2021 CLINICAL DATA:  75 year old male status post 1st treatment for pancreatic cancer 3 days ago. Syncope, loss of consciousness. EXAM: CT HEAD WITHOUT CONTRAST TECHNIQUE: Contiguous axial images were obtained from the base of the skull through the vertex without intravenous contrast. COMPARISON:  None. FINDINGS: Brain: Cerebral volume is within normal limits for age. No midline shift, mass effect, or evidence of intracranial mass lesion. No ventriculomegaly. Patchy bilateral white matter hypodensity. Deep gray matter nuclei relatively spared. No acute or chronic cortical infarct identified. No acute intracranial hemorrhage identified. Vascular: Calcified atherosclerosis at the skull base. No suspicious intracranial vascular hyperdensity. Skull: No acute or suspicious osseous lesion. Sinuses/Orbits: Visualized paranasal sinuses and mastoids are clear. Other: No acute orbit or scalp soft tissue finding. Postoperative changes to both globes. IMPRESSION: Moderate for age cerebral white matter changes most commonly due to small vessel disease. No acute or metastatic intracranial abnormality identified on this noncontrast exam. Electronically Signed   By: Herminio Heads.D.  On: 03/04/2021 05:40   CT ANGIO CHEST PE W OR WO CONTRAST  Result Date: 03/05/2021 CLINICAL DATA:  75 year old male with fever, syncope following 1st chemotherapy for pancreatic cancer. Abnormal D-dimer. EXAM: CT ANGIOGRAPHY CHEST WITH CONTRAST TECHNIQUE: Multidetector CT imaging of the chest was performed using the standard protocol during bolus administration of intravenous contrast.  Multiplanar CT image reconstructions and MIPs were obtained to evaluate the vascular anatomy. CONTRAST:  172mL OMNIPAQUE IOHEXOL 350 MG/ML SOLN COMPARISON:  Portable chest yesterday. Staging CT Chest, Abdomen, and Pelvis 01/29/2021. FINDINGS: Cardiovascular: Adequate contrast bolus timing in the pulmonary arterial tree. Intermittent mild respiratory motion in the lower lungs. No focal filling defect identified in the pulmonary arteries to suggest acute pulmonary embolism. Sequelae of CABG. Calcified aortic atherosclerosis. Borderline to mild cardiomegaly. No pericardial effusion. Right chest Port-A-Cath now in place. Mediastinum/Nodes: Negative.  No lymphadenopathy. Lungs/Pleura: Stable lung volumes from last month. Mild atelectatic changes to the major airways today which remain patent. Mild motion artifact at the lung bases. Occasional atelectasis and scarring in both lungs appears stable. No pleural effusion or acute pulmonary opacity. Upper Abdomen: Stable visible liver, spleen and stomach. Absent gallbladder. Negative visible splenic flexure. Pancreas not included. Musculoskeletal: Prior sternotomy. Stable visualized osseous structures. Review of the MIP images confirms the above findings. IMPRESSION: 1. No evidence of acute pulmonary embolus. 2. Stable chest from last month.  No acute or metastatic findings. 3. Aortic Atherosclerosis (ICD10-I70.0). Electronically Signed   By: Genevie Ann M.D.   On: 03/05/2021 05:29   DG Chest Portable 1 View  Result Date: 03/04/2021 CLINICAL DATA:  Fever following first chemotherapy for pancreatic cancer EXAM: PORTABLE CHEST 1 VIEW COMPARISON:  Radiograph 10/12/2020 FINDINGS: Low volumes and atelectatic change without focal consolidative opacity. No convincing features of edema. No pneumothorax or effusion. Stable cardiomegaly with postsurgical changes from prior sternotomy and CABG. Fracture of the superior most sternal suture is unchanged from comparison exam. A right IJ  approach Port-A-Cath tip terminates in the mid SVC. Telemetry leads overlie the chest. No acute osseous or soft tissue abnormality. Degenerative changes are present in the imaged spine and shoulders. IMPRESSION: 1. Low lung volumes and atelectatic change. No other acute cardiopulmonary abnormality. 2. Stable cardiomegaly. 3. Prior sternotomy and CABG. Electronically Signed   By: Lovena Le M.D.   On: 03/04/2021 05:20   DG CHEST PORT 1 VIEW  Result Date: 02/21/2021 CLINICAL DATA:  Post RIGHT Port-A-Cath insertion EXAM: PORTABLE CHEST 1 VIEW COMPARISON:  Chest CT 01/29/2021 FINDINGS: Port in the anterior chest wall with tip in distal SVC. Sternotomy wires overlie normal cardiac silhouette. Normal pulmonary vasculature. No effusion, infiltrate, or pneumothorax. No acute osseous abnormality. IMPRESSION: No complication following port placement. Electronically Signed   By: Suzy Bouchard M.D.   On: 02/21/2021 15:11   DG Fluoro Guide CV Line-No Report  Result Date: 02/21/2021 Fluoroscopy was utilized by the requesting physician.  No radiographic interpretation.   ECHOCARDIOGRAM COMPLETE  Result Date: 03/04/2021    ECHOCARDIOGRAM REPORT   Patient Name:   [name] [facility] Date of Exam: 03/04/2021 Medical Rec #:  [id number]             Height:       71.0 in Accession #:    [id number]            Weight:       283.0 lb Date of Birth:  1946-01-31            BSA:  2.444 m Patient Age:    75 years              BP:           117/63 mmHg Patient Gender: M                     HR:           79 bpm. Exam Location:  Inpatient Procedure: 2D Echo, Cardiac Doppler and Color Doppler Indications:    R55 Syncope  History:        Patient has no prior history of Echocardiogram examinations.                 Previous Myocardial Infarction and CAD; Risk                 Factors:Hypertension, Dyslipidemia and Diabetes.  Sonographer:    Bernadene Person RDCS Referring Phys: [id number] [place]  1. Left  ventricular ejection fraction, by estimation, is 60 to 65%. The left ventricle has normal function. The left ventricle has no regional wall motion abnormalities. Left ventricular diastolic parameters are indeterminate.  2. Right ventricular systolic function is normal. The right ventricular size is normal. There is normal pulmonary artery systolic pressure.  3. The mitral valve is normal in structure. No evidence of mitral valve regurgitation.  4. The aortic valve is normal in structure. Aortic valve regurgitation is not visualized.  5. The inferior vena cava is normal in size with greater than 50% respiratory variability, suggesting right atrial pressure of 3 mmHg. FINDINGS  Left Ventricle: Left ventricular ejection fraction, by estimation, is 60 to 65%. The left ventricle has normal function. The left ventricle has no regional wall motion abnormalities. The left ventricular internal cavity size was normal in size. There is  no left ventricular hypertrophy. Left ventricular diastolic parameters are indeterminate. Right Ventricle: The right ventricular size is normal. Right vetricular wall thickness was not assessed. Right ventricular systolic function is normal. There is normal pulmonary artery systolic pressure. The tricuspid regurgitant velocity is 2.28 m/s, and with an assumed right atrial pressure of 3 mmHg, the estimated right ventricular systolic pressure is 50.2 mmHg. Left Atrium: Left atrial size was normal in size. Right Atrium: Right atrial size was normal in size. Pericardium: There is no evidence of pericardial effusion. Mitral Valve: The mitral valve is normal in structure. No evidence of mitral valve regurgitation. Tricuspid Valve: The tricuspid valve is normal in structure. Tricuspid valve regurgitation is trivial. Aortic Valve: The aortic valve is normal in structure. Aortic valve regurgitation is not visualized. Pulmonic Valve: The pulmonic valve was normal in structure. Pulmonic valve  regurgitation is not visualized. Aorta: The aortic root and ascending aorta are structurally normal, with no evidence of dilitation. Venous: The inferior vena cava is normal in size with greater than 50% respiratory variability, suggesting right atrial pressure of 3 mmHg. IAS/Shunts: No atrial level shunt detected by color flow Doppler.  LEFT VENTRICLE PLAX 2D LVIDd:         4.70 cm  Diastology LVIDs:         3.10 cm  LV e' medial:    6.31 cm/s LV PW:         1.10 cm  LV E/e' medial:  19.0 LV IVS:        1.10 cm  LV e' lateral:   8.49 cm/s LVOT diam:     1.90 cm  LV E/e' lateral: 14.1 LV SV:  71 LV SV Index:   29 LVOT Area:     2.84 cm  RIGHT VENTRICLE RV S prime:     12.90 cm/s TAPSE (M-mode): 1.5 cm LEFT ATRIUM             Index       RIGHT ATRIUM           Index LA diam:        5.60 cm 2.29 cm/m  RA Area:     14.00 cm LA Vol (A2C):   72.6 ml 29.71 ml/m RA Volume:   32.40 ml  13.26 ml/m LA Vol (A4C):   76.6 ml 31.35 ml/m LA Biplane Vol: 79.2 ml 32.41 ml/m  AORTIC VALVE LVOT Vmax:   133.00 cm/s LVOT Vmean:  95.000 cm/s LVOT VTI:    0.250 m  AORTA Ao Root diam: 3.30 cm Ao Asc diam:  3.30 cm MITRAL VALVE                TRICUSPID VALVE MV Area (PHT): 4.60 cm     TR Peak grad:   20.8 mmHg MV Decel Time: 165 msec     TR Vmax:        228.00 cm/s MV E velocity: 120.00 cm/s MV A velocity: 65.60 cm/s   SHUNTS MV E/A ratio:  1.83         Systemic VTI:  0.25 m                             Systemic Diam: 1.90 cm Dorris Carnes MD Electronically signed by Dorris Carnes MD Signature Date/Time: 03/04/2021/4:25:39 PM    Final    VAS [place] LOWER EXTREMITY VENOUS (DVT)  Result Date: 03/05/2021  Lower Venous DVT Study Indications: Swelling.  Risk Factors: None identified. Limitations: Poor ultrasound/tissue interface and body habitus. Comparison Study: No prior studies. Performing Technologist: Oliver Hum RVT  Examination Guidelines: A complete evaluation includes B-mode imaging, spectral Doppler, color Doppler, and power  Doppler as needed of all accessible portions of each vessel. Bilateral testing is considered an integral part of a complete examination. Limited examinations for reoccurring indications may be performed as noted. The reflux portion of the exam is performed with the patient in reverse Trendelenburg.  +---------+---------------+---------+-----------+----------+--------------+ RIGHT    CompressibilityPhasicitySpontaneityPropertiesThrombus Aging +---------+---------------+---------+-----------+----------+--------------+ CFV      Full           Yes      Yes                                 +---------+---------------+---------+-----------+----------+--------------+ SFJ      Full                                                        +---------+---------------+---------+-----------+----------+--------------+ FV Prox  Full                                                        +---------+---------------+---------+-----------+----------+--------------+ FV Mid   Full                                                        +---------+---------------+---------+-----------+----------+--------------+  FV DistalFull                                                        +---------+---------------+---------+-----------+----------+--------------+ PFV      Full                                                        +---------+---------------+---------+-----------+----------+--------------+ POP      Full           Yes      Yes                                 +---------+---------------+---------+-----------+----------+--------------+ PTV      Full                                                        +---------+---------------+---------+-----------+----------+--------------+ PERO     Full                                                        +---------+---------------+---------+-----------+----------+--------------+    +----+---------------+---------+-----------+----------+--------------+ LEFTCompressibilityPhasicitySpontaneityPropertiesThrombus Aging +----+---------------+---------+-----------+----------+--------------+ CFV Full           Yes      Yes                                 +----+---------------+---------+-----------+----------+--------------+     Summary: RIGHT: - There is no evidence of deep vein thrombosis in the lower extremity.  - No cystic structure found in the popliteal fossa.  LEFT: - No evidence of common femoral vein obstruction.  *See table(s) above for measurements and observations. Electronically signed by Deitra Mayo MD on 03/05/2021 at 3:46:13 PM.    Final     ASSESSMENT AND PLAN: 1. 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 ? 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 Normal CA 19-9 01/24/2021 ? CTs 01/29/2021-no pancreatic mass.  No pancreatic ductal dilatation identified.  No definite signs of metastatic disease in the chest, abdomen or pelvis. ? Cycle #1 gemcitabine Abraxane 03/01/2021 2. Diabetes 3. Coronary artery disease 4. Prostate cancer 2013-treated with radiation at [facility] 5. Hypertension 6. Sleep apnea 7. Coronary artery bypass surgery 2004 8.  Hospital admission 03/04/2021-syncope, A. Fib 9.  Mild thrombocytopenia-stable  [name] is now at day 6 cycle #1 gemcitabine/Abraxane.  No further fever,  did have episode of chills last night. Blood cultures on admission negative. Repeat  blood cultures obtained last night.   He has persistent erythema and edema of his right arm. This may be "pseudocellulitis" related to gemcitabine, cellulitis or thrombophlebitis. Continues to have edema and erythema of right lower leg, doppler ultrasound negative for DVT.    Recommendations: 1. Course of Keflex for possible cellulitis 2. Work-up/management of A. fib and syncope per cardiology. 3. Discharge planning per medical service and cardiology 4. Outpatient follow-up at the cancer to continue chemotherapy with gemcitabine and Abraxane on 03/14/2021    LOS: 1 day   Ned Card,  AGPCNP-BC 03/06/21 [name] was interviewed and examined.  He appears stable.  There is persistent erythema and induration at the right posterior forearm.  This may be related to cellulitis, a gemcitabine rash, or thrombophlebitis.  He is maintained on anticoagulation therapy.  I recommend a course of Keflex.  He is stable for discharge from an oncology standpoint.  He will follow-up at the Cancer center as scheduled next week.  I was present for greater than 50% of today's visit.  I perform medical decision making.

## 2021-03-06 NOTE — Progress Notes (Signed)
Pt states , I am seeing things on the ceiling, like worms, but then goes away.  Pt stating twice today he though he was hallucinating some. Notified MD . Will continue to monitor.

## 2021-03-06 NOTE — Progress Notes (Signed)
Upper extremity venous has been completed.   Preliminary results in CV Proc.   Abram Sander 03/06/2021 11:04 AM

## 2021-03-06 NOTE — Evaluation (Signed)
Occupational Therapy Evaluation Patient Details Name: [name] MRN: [id number] DOB: January 22, 1946 Today's Date: 03/06/2021    History of Present Illness Patient is a 75 year old male with PMH of CAD s/p CABG, HTN, HLD. Presenting with a syncopal episodes. Patient had first chemotherapy infusion on 03/01/2021 for pancreatic cancer.   Clinical Impression   Patient lives at home with spouse, son, and daughter + grandson in a two story house, patient can stay on main level. Prior to syncopal episode patient reports he was modified independent with self care and ambulation with rollator. Patient was using a cane for ambulation prior to his recent pancreatic cancer diagnosis. Since fall at home patient needing increased assistance with ADLs, primarily LB. Patient needed max A for LB dressing this session, set up for UB ADL and min G to min A with functional ambulation/transfers. Patient initially unsteady with ambulation, however improved to min G with continued mobility. Patient first reported "room spinning" when OT walked into room, denied any further dizziness during session. Recommend continued acute OT services for ADL retraining, balance, endurance in order to facilitate D/C to venue listed below.  BP semi-supine 147/78 BP seated 138/75 BP standing 135/74     Follow Up Recommendations  Supervision/Assistance - 24 hour    Equipment Recommendations  Other (comment) (pt reports his rollator at home broke, will need new walker.)       Precautions / Restrictions Precautions Precautions: Fall Precaution Comments: monitor BP, negative orthostatics on 3/23 Restrictions Weight Bearing Restrictions: No      Mobility Bed Mobility Overal bed mobility: Needs Assistance Bed Mobility: Supine to Sit     Supine to sit: HOB elevated;Min guard     General bed mobility comments: min G for safety    Transfers Overall transfer level: Needs assistance Equipment used: Rolling walker (2  wheeled) Transfers: Sit to/from Stand Sit to Stand: Min guard;Min assist;From elevated surface         General transfer comment: initial stand patient min A for stability, improved balance with continued ambulation with walker    Balance Overall balance assessment: Needs assistance Sitting-balance support: Feet supported Sitting balance-Leahy Scale: Good     Standing balance support: Single extremity supported Standing balance-Leahy Scale: Poor Standing balance comment: reliant on external support                           ADL either performed or assessed with clinical judgement   ADL Overall ADL's : Needs assistance/impaired     Grooming: Set up;Sitting   Upper Body Bathing: Set up;Sitting   Lower Body Bathing: Moderate assistance;Sitting/lateral leans;Sit to/from stand   Upper Body Dressing : Set up;Sitting   Lower Body Dressing: Maximal assistance;Sitting/lateral leans;Sit to/from stand Lower Body Dressing Details (indicate cue type and reason): to don slippers, patient's feet swollen having difficulty getting on himself. reports difficulty with LB dressing since his fall few days ago. Toilet Transfer: Ambulation;RW;Regular Toilet;Min guard;Minimal Print production planner Details (indicate cue type and reason): initial steps patient a little unsteady, improved with continued ambulation with walker. able to power up off of toilet without physical assistance Toileting- Clothing Manipulation and Hygiene: Min guard;Sitting/lateral lean;Sit to/from stand       Functional mobility during ADLs: Min guard;Minimal assistance;Rolling walker                    Pertinent Vitals/Pain Pain Assessment: No/denies pain     Hand Dominance Right  Extremity/Trunk Assessment Upper Extremity Assessment Upper Extremity Assessment: Overall WFL for tasks assessed   Lower Extremity Assessment Lower Extremity Assessment: Defer to PT evaluation        Communication Communication Communication: No difficulties   Cognition Arousal/Alertness: Awake/alert Behavior During Therapy: WFL for tasks assessed/performed Overall Cognitive Status: Within Functional Limits for tasks assessed                                 General Comments: patient does report to OT that this morning he thought he saw worms on the ceiling for a split second, has also felt like someone was in the room with him even though he knew there wasn't. Told patient to inform MD about this, patient states started just within past day.              Home Living Family/patient expects to be discharged to:: Private residence Living Arrangements: Spouse/significant other;Children Available Help at Discharge: Family Type of Home: House Home Access: Stairs to enter Technical brewer of Steps: 3 Entrance Stairs-Rails: Right Home Layout: Two level;Able to live on main level with bedroom/bathroom     Bathroom Shower/Tub: Occupational psychologist: Standard     Home Equipment: [name] - single point (broke his 4WW after recent fall)          Prior Functioning/Environment Level of Independence: Independent with assistive device(s)        Comments: started using rollator recently after cancer diagnosis, prior to that used cane        OT Problem List: Decreased activity tolerance;Impaired balance (sitting and/or standing);Decreased safety awareness;Decreased knowledge of use of DME or AE;Obesity      OT Treatment/Interventions: Self-care/ADL training;DME and/or AE instruction;Therapeutic activities;Patient/family education;Balance training    OT Goals(Current goals can be found in the care plan section) Acute Rehab OT Goals Patient Stated Goal: regain IND OT Goal Formulation: With patient Time For Goal Achievement: 03/20/21 Potential to Achieve Goals: Good  OT Frequency: Min 2X/week    AM-PAC OT "6 Clicks" Daily Activity     Outcome  Measure Help from another person eating meals?: None Help from another person taking care of personal grooming?: A Little Help from another person toileting, which includes using toliet, bedpan, or urinal?: A Little Help from another person bathing (including washing, rinsing, drying)?: A Lot Help from another person to put on and taking off regular upper body clothing?: A Little Help from another person to put on and taking off regular lower body clothing?: A Lot 6 Click Score: 17   End of Session Equipment Utilized During Treatment: Rolling walker;Gait belt Nurse Communication: Mobility status  Activity Tolerance: Patient tolerated treatment well Patient left: in chair;with call bell/phone within reach;with chair alarm set  OT Visit Diagnosis: History of falling (Z91.81)                Time: 4656-8127 OT Time Calculation (min): 26 min Charges:  OT General Charges $OT Visit: 1 Visit OT Evaluation $OT Eval Low Complexity: 1 Low  [name] OT OT pager: [phone]  [name] 03/06/2021, 12:05 PM

## 2021-03-07 ENCOUNTER — Encounter: Payer: Medicare Other | Admitting: Genetic Counselor

## 2021-03-07 ENCOUNTER — Other Ambulatory Visit: Payer: Medicare Other

## 2021-03-07 ENCOUNTER — Encounter: Payer: Medicare Other | Admitting: Nutrition

## 2021-03-07 DIAGNOSIS — D6959 Other secondary thrombocytopenia: Secondary | ICD-10-CM

## 2021-03-07 DIAGNOSIS — L03113 Cellulitis of right upper limb: Secondary | ICD-10-CM

## 2021-03-07 DIAGNOSIS — C250 Malignant neoplasm of head of pancreas: Secondary | ICD-10-CM

## 2021-03-07 DIAGNOSIS — T451X5A Adverse effect of antineoplastic and immunosuppressive drugs, initial encounter: Secondary | ICD-10-CM

## 2021-03-07 DIAGNOSIS — D701 Agranulocytosis secondary to cancer chemotherapy: Secondary | ICD-10-CM

## 2021-03-07 LAB — CBC WITH DIFFERENTIAL/PLATELET
Abs Immature Granulocytes: 0.02 10*3/uL (ref 0.00–0.07)
Basophils Absolute: 0 10*3/uL (ref 0.0–0.1)
Basophils Relative: 0 %
Eosinophils Absolute: 0.1 10*3/uL (ref 0.0–0.5)
Eosinophils Relative: 2 %
HCT: 25.2 % — ABNORMAL LOW (ref 39.0–52.0)
Hemoglobin: 8.8 g/dL — ABNORMAL LOW (ref 13.0–17.0)
Immature Granulocytes: 1 %
Lymphocytes Relative: 19 %
Lymphs Abs: 0.5 10*3/uL — ABNORMAL LOW (ref 0.7–4.0)
MCH: 31.8 pg (ref 26.0–34.0)
MCHC: 34.9 g/dL (ref 30.0–36.0)
MCV: 91 fL (ref 80.0–100.0)
Monocytes Absolute: 0.1 10*3/uL (ref 0.1–1.0)
Monocytes Relative: 5 %
Neutro Abs: 1.9 10*3/uL (ref 1.7–7.7)
Neutrophils Relative %: 73 %
Platelets: 90 10*3/uL — ABNORMAL LOW (ref 150–400)
RBC: 2.77 MIL/uL — ABNORMAL LOW (ref 4.22–5.81)
RDW: 14.5 % (ref 11.5–15.5)
WBC: 2.6 10*3/uL — ABNORMAL LOW (ref 4.0–10.5)
nRBC: 0 % (ref 0.0–0.2)

## 2021-03-07 LAB — COMPREHENSIVE METABOLIC PANEL
ALT: 59 U/L — ABNORMAL HIGH (ref 0–44)
AST: 54 U/L — ABNORMAL HIGH (ref 15–41)
Albumin: 3 g/dL — ABNORMAL LOW (ref 3.5–5.0)
Alkaline Phosphatase: 74 U/L (ref 38–126)
Anion gap: 9 (ref 5–15)
BUN: 10 mg/dL (ref 8–23)
CO2: 24 mmol/L (ref 22–32)
Calcium: 8.1 mg/dL — ABNORMAL LOW (ref 8.9–10.3)
Chloride: 105 mmol/L (ref 98–111)
Creatinine, Ser: 1.17 mg/dL (ref 0.61–1.24)
GFR, Estimated: >60 mL/min (ref >60)
Glucose, Bld: 130 mg/dL — ABNORMAL HIGH (ref 70–99)
Potassium: 3.6 mmol/L (ref 3.5–5.1)
Sodium: 138 mmol/L (ref 135–145)
Total Bilirubin: 1.6 mg/dL — ABNORMAL HIGH (ref 0.3–1.2)
Total Protein: 6 g/dL — ABNORMAL LOW (ref 6.5–8.1)

## 2021-03-07 LAB — GLUCOSE, CAPILLARY
Glucose-Capillary: 144 mg/dL — ABNORMAL HIGH (ref 70–99)
Glucose-Capillary: 221 mg/dL — ABNORMAL HIGH (ref 70–99)

## 2021-03-07 LAB — IRON AND TIBC
Iron: 29 ug/dL — ABNORMAL LOW (ref 45–182)
Saturation Ratios: 15 % — ABNORMAL LOW (ref 17.9–39.5)
TIBC: 198 ug/dL — ABNORMAL LOW (ref 250–450)
UIBC: 169 ug/dL

## 2021-03-07 LAB — FERRITIN: Ferritin: 450 ng/mL — ABNORMAL HIGH (ref 24–336)

## 2021-03-07 LAB — MAGNESIUM: Magnesium: 2 mg/dL (ref 1.7–2.4)

## 2021-03-07 LAB — VITAMIN B12: Vitamin B-12: 256 pg/mL (ref 180–914)

## 2021-03-07 LAB — FOLATE: Folate: 16.2 ng/mL (ref >5.9)

## 2021-03-07 LAB — PHOSPHORUS: Phosphorus: 2.4 mg/dL — ABNORMAL LOW (ref 2.5–4.6)

## 2021-03-07 MED ORDER — POTASSIUM CHLORIDE CRYS ER 20 MEQ PO TBCR
40.0000 meq | EXTENDED_RELEASE_TABLET | Freq: Once | ORAL | Status: AC
  Administered 2021-03-07: 40 meq via ORAL
  Filled 2021-03-07: qty 2

## 2021-03-07 MED ORDER — VITAMIN B-12 1000 MCG PO TABS: 1000.0000 ug | ORAL_TABLET | Freq: Every day | ORAL | 0 refills | Status: AC

## 2021-03-07 MED ORDER — MIDODRINE HCL 5 MG PO TABS: 5.0000 mg | ORAL_TABLET | Freq: Three times a day (TID) | ORAL | 0 refills | Status: AC

## 2021-03-07 MED ORDER — AMOXICILLIN 500 MG PO CAPS: 500.0000 mg | ORAL_CAPSULE | Freq: Three times a day (TID) | ORAL | 0 refills | Status: AC

## 2021-03-07 MED ORDER — APIXABAN 5 MG PO TABS: 5.0000 mg | ORAL_TABLET | Freq: Two times a day (BID) | ORAL | 0 refills | Status: DC

## 2021-03-07 MED ORDER — FERROUS SULFATE 325 (65 FE) MG PO TABS: 325.0000 mg | ORAL_TABLET | Freq: Every day | ORAL | 0 refills | Status: DC

## 2021-03-07 MED ORDER — HEPARIN SOD (PORK) LOCK FLUSH 100 UNIT/ML IV SOLN
500.0000 [IU] | INTRAVENOUS | Status: DC | PRN
  Filled 2021-03-07: qty 5

## 2021-03-07 NOTE — Progress Notes (Signed)
OT Cancellation Note  Patient Details Name: [name] MRN: [id number] DOB: 12-Mar-1946   Cancelled Treatment:    Reason Eval/Treat Not Completed: Patient declined, no reason specified Patient reports he is going home today.   Delbert Phenix OT OT pager: [place] 03/07/2021, 1:44 PM

## 2021-03-07 NOTE — Progress Notes (Signed)
HEMATOLOGY-ONCOLOGY PROGRESS NOTE  SUBJECTIVE: The right arm feels better.  He acknowledges seeing "worms "yesterday.  He does not have hallucinations today.  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[place])   01/31/2021 Cancer Staging   Staging form: Exocrine Pancreas, AJCC 8th Edition - Clinical: Stage IB (cT2, cN0, cM0) - Signed by Ladell Pier, MD on 01/31/2021 Total positive nodes: 0   03/01/2021 -  Chemotherapy    Patient is on Treatment Plan: PANCREATIC ABRAXANE / GEMCITABINE D1,8,15 Q28D        PHYSICAL EXAMINATION:  Vitals:   03/07/21 1149 03/07/21 1243  BP: 140/83 (!) 147/81  Pulse: 88 86  Resp:  17  Temp: 98.1 F (36.7 C) 98 F (36.7 C)  SpO2: 98% 96%   Filed Weights   03/07/21 0500  Weight: 283 lb 1.1 oz (128.4 kg)    Intake/Output from previous day: 03/23 0701 - 03/24 0700 In: 710 [P.O.:710] Out: 400 [Urine:400]  GENERAL:alert, no distress and comfortable SKIN: Erythema/edema posterior right forearm--less erythematous EYES: normal, Conjunctiva are pink and non-injected, sclera clear OROPHARYNX: no thrush  HEART: regular rate and rhythm Lungs: Clear bilaterally ABDOMEN:abdomen soft, non-tender and normal bowel sounds NEURO: alert & oriented x 3 with fluent speech Vascular: Mild edema and erythema at the right lower leg Port-A-Cath without erythema  LABORATORY DATA:  I have reviewed the data as listed CMP Latest Ref Rng & Units 03/07/2021 03/06/2021 03/05/2021  Glucose 70 - 99 mg/dL 130(H) 236(H) 121(H)  BUN 8 - 23 mg/dL 10 12 17   Creatinine 0.61 - 1.24 mg/dL 1.17 1.16 1.17  Sodium 135 - 145 mmol/L 138 132(L) 131(L)  Potassium 3.5 - 5.1 mmol/L 3.6 3.7 3.2(L)  Chloride 98 - 111 mmol/L 105 104 100  CO2 22 - 32 mmol/L 24 22 24   Calcium 8.9 - 10.3 mg/dL 8.1(L) 8.1(L) 8.4(L)  Total Protein 6.5 - 8.1 g/dL 6.0(L) 6.0(L) 6.4(L)  Total Bilirubin 0.3 - 1.2 mg/dL 1.6(H) 1.4(H) 1.6(H)   Alkaline Phos 38 - 126 U/L 74 62 55  AST 15 - 41 U/L 54(H) 45(H) 67(H)  ALT 0 - 44 U/L 59(H) 51(H) 64(H)    Lab Results  Component Value Date   WBC 2.6 (L) 03/07/2021   HGB 8.8 (L) 03/07/2021   HCT 25.2 (L) 03/07/2021   MCV 91.0 03/07/2021   PLT 90 (L) 03/07/2021   NEUTROABS 1.9 03/07/2021    CT Head Wo Contrast  Result Date: 03/04/2021 CLINICAL DATA:  75 year old male status post 1st treatment for pancreatic cancer 3 days ago. Syncope, loss of consciousness. EXAM: CT HEAD WITHOUT CONTRAST TECHNIQUE: Contiguous axial images were obtained from the base of the skull through the vertex without intravenous contrast. COMPARISON:  None. FINDINGS: Brain: Cerebral volume is within normal limits for age. No midline shift, mass effect, or evidence of intracranial mass lesion. No ventriculomegaly. Patchy bilateral white matter hypodensity. Deep gray matter nuclei relatively spared. No acute or chronic cortical infarct identified. No acute intracranial hemorrhage identified. Vascular: Calcified atherosclerosis at the skull base. No suspicious intracranial vascular hyperdensity. Skull: No acute or suspicious osseous lesion. Sinuses/Orbits: Visualized paranasal sinuses and mastoids are clear. Other: No acute orbit or scalp soft tissue finding. Postoperative changes to both globes. IMPRESSION: Moderate for age cerebral white matter changes most commonly due to small vessel disease. No acute or metastatic intracranial abnormality identified on this noncontrast exam. Electronically Signed   By: Genevie Ann  M.D.   On: 03/04/2021 05:40   CT ANGIO CHEST PE W OR WO CONTRAST  Result Date: 03/05/2021 CLINICAL DATA:  75 year old male with fever, syncope following 1st chemotherapy for pancreatic cancer. Abnormal D-dimer. EXAM: CT ANGIOGRAPHY CHEST WITH CONTRAST TECHNIQUE: Multidetector CT imaging of the chest was performed using the standard protocol during bolus administration of intravenous contrast. Multiplanar CT  image reconstructions and MIPs were obtained to evaluate the vascular anatomy. CONTRAST:  159mL OMNIPAQUE IOHEXOL 350 MG/ML SOLN COMPARISON:  Portable chest yesterday. Staging CT Chest, Abdomen, and Pelvis 01/29/2021. FINDINGS: Cardiovascular: Adequate contrast bolus timing in the pulmonary arterial tree. Intermittent mild respiratory motion in the lower lungs. No focal filling defect identified in the pulmonary arteries to suggest acute pulmonary embolism. Sequelae of CABG. Calcified aortic atherosclerosis. Borderline to mild cardiomegaly. No pericardial effusion. Right chest Port-A-Cath now in place. Mediastinum/Nodes: Negative.  No lymphadenopathy. Lungs/Pleura: Stable lung volumes from last month. Mild atelectatic changes to the major airways today which remain patent. Mild motion artifact at the lung bases. Occasional atelectasis and scarring in both lungs appears stable. No pleural effusion or acute pulmonary opacity. Upper Abdomen: Stable visible liver, spleen and stomach. Absent gallbladder. Negative visible splenic flexure. Pancreas not included. Musculoskeletal: Prior sternotomy. Stable visualized osseous structures. Review of the MIP images confirms the above findings. IMPRESSION: 1. No evidence of acute pulmonary embolus. 2. Stable chest from last month.  No acute or metastatic findings. 3. Aortic Atherosclerosis (ICD10-I70.0). Electronically Signed   By: Genevie Ann M.D.   On: 03/05/2021 05:29   DG Chest Portable 1 View  Result Date: 03/04/2021 CLINICAL DATA:  Fever following first chemotherapy for pancreatic cancer EXAM: PORTABLE CHEST 1 VIEW COMPARISON:  Radiograph 10/12/2020 FINDINGS: Low volumes and atelectatic change without focal consolidative opacity. No convincing features of edema. No pneumothorax or effusion. Stable cardiomegaly with postsurgical changes from prior sternotomy and CABG. Fracture of the superior most sternal suture is unchanged from comparison exam. A right IJ approach  Port-A-Cath tip terminates in the mid SVC. Telemetry leads overlie the chest. No acute osseous or soft tissue abnormality. Degenerative changes are present in the imaged spine and shoulders. IMPRESSION: 1. Low lung volumes and atelectatic change. No other acute cardiopulmonary abnormality. 2. Stable cardiomegaly. 3. Prior sternotomy and CABG. Electronically Signed   By: Lovena Le M.D.   On: 03/04/2021 05:20   DG CHEST PORT 1 VIEW  Result Date: 02/21/2021 CLINICAL DATA:  Post RIGHT Port-A-Cath insertion EXAM: PORTABLE CHEST 1 VIEW COMPARISON:  Chest CT 01/29/2021 FINDINGS: Port in the anterior chest wall with tip in distal SVC. Sternotomy wires overlie normal cardiac silhouette. Normal pulmonary vasculature. No effusion, infiltrate, or pneumothorax. No acute osseous abnormality. IMPRESSION: No complication following port placement. Electronically Signed   By: Suzy Bouchard M.D.   On: 02/21/2021 15:11   DG Fluoro Guide CV Line-No Report  Result Date: 02/21/2021 Fluoroscopy was utilized by the requesting physician.  No radiographic interpretation.   ECHOCARDIOGRAM COMPLETE  Result Date: 03/04/2021    ECHOCARDIOGRAM REPORT   Patient Name:   GENTLE HOGE [facility] Date of Exam: 03/04/2021 Medical Rec #:  [id number]             Height:       71.0 in Accession #:    [id number]            Weight:       283.0 lb Date of Birth:  1946/03/08            BSA:  2.444 m Patient Age:    75 years              BP:           117/63 mmHg Patient Gender: M                     HR:           79 bpm. Exam Location:  Inpatient Procedure: 2D Echo, Cardiac Doppler and Color Doppler Indications:    R55 Syncope  History:        Patient has no prior history of Echocardiogram examinations.                 Previous Myocardial Infarction and CAD; Risk                 Factors:Hypertension, Dyslipidemia and Diabetes.  Sonographer:    Bernadene Person RDCS Referring Phys: [id number] [place]  1. Left ventricular  ejection fraction, by estimation, is 60 to 65%. The left ventricle has normal function. The left ventricle has no regional wall motion abnormalities. Left ventricular diastolic parameters are indeterminate.  2. Right ventricular systolic function is normal. The right ventricular size is normal. There is normal pulmonary artery systolic pressure.  3. The mitral valve is normal in structure. No evidence of mitral valve regurgitation.  4. The aortic valve is normal in structure. Aortic valve regurgitation is not visualized.  5. The inferior vena cava is normal in size with greater than 50% respiratory variability, suggesting right atrial pressure of 3 mmHg. FINDINGS  Left Ventricle: Left ventricular ejection fraction, by estimation, is 60 to 65%. The left ventricle has normal function. The left ventricle has no regional wall motion abnormalities. The left ventricular internal cavity size was normal in size. There is  no left ventricular hypertrophy. Left ventricular diastolic parameters are indeterminate. Right Ventricle: The right ventricular size is normal. Right vetricular wall thickness was not assessed. Right ventricular systolic function is normal. There is normal pulmonary artery systolic pressure. The tricuspid regurgitant velocity is 2.28 m/s, and with an assumed right atrial pressure of 3 mmHg, the estimated right ventricular systolic pressure is 33.2 mmHg. Left Atrium: Left atrial size was normal in size. Right Atrium: Right atrial size was normal in size. Pericardium: There is no evidence of pericardial effusion. Mitral Valve: The mitral valve is normal in structure. No evidence of mitral valve regurgitation. Tricuspid Valve: The tricuspid valve is normal in structure. Tricuspid valve regurgitation is trivial. Aortic Valve: The aortic valve is normal in structure. Aortic valve regurgitation is not visualized. Pulmonic Valve: The pulmonic valve was normal in structure. Pulmonic valve regurgitation is not  visualized. Aorta: The aortic root and ascending aorta are structurally normal, with no evidence of dilitation. Venous: The inferior vena cava is normal in size with greater than 50% respiratory variability, suggesting right atrial pressure of 3 mmHg. IAS/Shunts: No atrial level shunt detected by color flow Doppler.  LEFT VENTRICLE PLAX 2D LVIDd:         4.70 cm  Diastology LVIDs:         3.10 cm  LV e' medial:    6.31 cm/s LV PW:         1.10 cm  LV E/e' medial:  19.0 LV IVS:        1.10 cm  LV e' lateral:   8.49 cm/s LVOT diam:     1.90 cm  LV E/e' lateral: 14.1 LV SV:  71 LV SV Index:   29 LVOT Area:     2.84 cm  RIGHT VENTRICLE RV S prime:     12.90 cm/s TAPSE (M-mode): 1.5 cm LEFT ATRIUM             Index       RIGHT ATRIUM           Index LA diam:        5.60 cm 2.29 cm/m  RA Area:     14.00 cm LA Vol (A2C):   72.6 ml 29.71 ml/m RA Volume:   32.40 ml  13.26 ml/m LA Vol (A4C):   76.6 ml 31.35 ml/m LA Biplane Vol: 79.2 ml 32.41 ml/m  AORTIC VALVE LVOT Vmax:   133.00 cm/s LVOT Vmean:  95.000 cm/s LVOT VTI:    0.250 m  AORTA Ao Root diam: 3.30 cm Ao Asc diam:  3.30 cm MITRAL VALVE                TRICUSPID VALVE MV Area (PHT): 4.60 cm     TR Peak grad:   20.8 mmHg MV Decel Time: 165 msec     TR Vmax:        228.00 cm/s MV E velocity: 120.00 cm/s MV A velocity: 65.60 cm/s   SHUNTS MV E/A ratio:  1.83         Systemic VTI:  0.25 m                             Systemic Diam: 1.90 cm Dorris Carnes MD Electronically signed by Dorris Carnes MD Signature Date/Time: 03/04/2021/4:25:39 PM    Final    VAS [place] LOWER EXTREMITY VENOUS (DVT)  Result Date: 03/05/2021  Lower Venous DVT Study Indications: Swelling.  Risk Factors: None identified. Limitations: Poor ultrasound/tissue interface and body habitus. Comparison Study: No prior studies. Performing Technologist: Oliver Hum RVT  Examination Guidelines: A complete evaluation includes B-mode imaging, spectral Doppler, color Doppler, and power Doppler as needed of  all accessible portions of each vessel. Bilateral testing is considered an integral part of a complete examination. Limited examinations for reoccurring indications may be performed as noted. The reflux portion of the exam is performed with the patient in reverse Trendelenburg.  +---------+---------------+---------+-----------+----------+--------------+ RIGHT    CompressibilityPhasicitySpontaneityPropertiesThrombus Aging +---------+---------------+---------+-----------+----------+--------------+ CFV      Full           Yes      Yes                                 +---------+---------------+---------+-----------+----------+--------------+ SFJ      Full                                                        +---------+---------------+---------+-----------+----------+--------------+ FV Prox  Full                                                        +---------+---------------+---------+-----------+----------+--------------+ FV Mid   Full                                                        +---------+---------------+---------+-----------+----------+--------------+  FV DistalFull                                                        +---------+---------------+---------+-----------+----------+--------------+ PFV      Full                                                        +---------+---------------+---------+-----------+----------+--------------+ POP      Full           Yes      Yes                                 +---------+---------------+---------+-----------+----------+--------------+ PTV      Full                                                        +---------+---------------+---------+-----------+----------+--------------+ PERO     Full                                                        +---------+---------------+---------+-----------+----------+--------------+   +----+---------------+---------+-----------+----------+--------------+  LEFTCompressibilityPhasicitySpontaneityPropertiesThrombus Aging +----+---------------+---------+-----------+----------+--------------+ CFV Full           Yes      Yes                                 +----+---------------+---------+-----------+----------+--------------+     Summary: RIGHT: - There is no evidence of deep vein thrombosis in the lower extremity.  - No cystic structure found in the popliteal fossa.  LEFT: - No evidence of common femoral vein obstruction.  *See table(s) above for measurements and observations. Electronically signed by Deitra Mayo MD on 03/05/2021 at 3:46:13 PM.    Final    VAS [place] UPPER EXTREMITY VENOUS DUPLEX  Result Date: 03/06/2021 UPPER VENOUS STUDY  Indications: Swelling Comparison Study: no prior Performing Technologist: Abram Sander RVS  Examination Guidelines: A complete evaluation includes B-mode imaging, spectral Doppler, color Doppler, and power Doppler as needed of all accessible portions of each vessel. Bilateral testing is considered an integral part of a complete examination. Limited examinations for reoccurring indications may be performed as noted.  Right Findings: +----------+------------+---------+-----------+----------+-------+ RIGHT     CompressiblePhasicitySpontaneousPropertiesSummary +----------+------------+---------+-----------+----------+-------+ IJV           Full       Yes       Yes                      +----------+------------+---------+-----------+----------+-------+ Subclavian    Full       Yes       Yes                      +----------+------------+---------+-----------+----------+-------+ Axillary  Full       Yes       Yes                      +----------+------------+---------+-----------+----------+-------+ Brachial      Full                                          +----------+------------+---------+-----------+----------+-------+ Radial        Full                                           +----------+------------+---------+-----------+----------+-------+ Ulnar         Full                                          +----------+------------+---------+-----------+----------+-------+ Cephalic      Full                                          +----------+------------+---------+-----------+----------+-------+ Basilic       Full                                          +----------+------------+---------+-----------+----------+-------+  Summary:  Right: No evidence of deep vein thrombosis in the upper extremity. No evidence of superficial vein thrombosis in the upper extremity. No evidence of thrombosis in the subclavian.  *See table(s) above for measurements and observations.  Diagnosing physician: Curt Jews MD Electronically signed by Curt Jews MD on 03/06/2021 at 2:09:29 PM.    Final     ASSESSMENT AND PLAN: 1. 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 ? 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 Normal CA 19-9 01/24/2021 ? CTs 01/29/2021-no pancreatic mass.  No pancreatic ductal dilatation identified.  No definite signs of metastatic disease in the chest, abdomen or pelvis. ? Cycle #1 gemcitabine Abraxane 03/01/2021 2. Diabetes 3. Coronary artery disease 4. Prostate cancer 2013-[facility] with [facility] at [facility] 5. Hypertension 6. Sleep apnea 7. Coronary artery bypass surgery 2004 8.  Hospital admission 03/04/2021-syncope, A. Fib 9.  Mild thrombocytopenia-stable  [name] is now at day 7  following cycle 1 gemcitabine/Abraxane.  He has mild neutropenia and thrombocytopenia secondary to chemotherapy.  The erythema and induration at the posterior right arm appears partially improved today.  I recommend he continue the course of Keflex.  Mr. Meadowcroft does not appear confused today.   Recommendations: 1.  Continue Keflex for the possible right arm cellulitis 2.  Outpatient follow-up at the cancer center as scheduled on 03/14/2021     LOS: 2 days   Betsy Coder,  AGPCNP-BC 03/07/21

## 2021-03-07 NOTE — Discharge Summary (Signed)
Physician Discharge Summary  Chloe Maggiore Antonson WUJ:[id number] DOB: 1946/05/08 DOA: 03/04/2021  PCP: [place], [name] G, MD  Admit date: 03/04/2021 Discharge date: 03/07/2021  Time spent: 40 minutes  Recommendations for Outpatient Follow-up:  1. Follow outpatient CBC/CMP 2. Follow BP and orthostatic hypotension on midodrine 3. Follow pending MMA - follow iron def - started on iron/b12 4. Follow rash on R arm with amoxicillin - adjust as needed 5. Follow anemia, thrombocytopenia on eliquis  6. Follow pending blood cultures 7. Follow blood sugars outpatient 8. Follow LFT's outpatient - may need to hold statin if worsening  Discharge Diagnoses:  Active Problems:   Syncope and collapse   Atrial fibrillation [facility])   Discharge Condition: stable  Diet recommendation: heart healthy  Filed Weights   03/07/21 0500  Weight: 128.4 kg   History of present illness:  Jodi Marble Sparkmanis Mallie Linnemann 75 y.o.malewith medical history significant ofCAD s/p CABG, HTN, HLD,recent diagnosis of pancreatic cancer presenting with Deeandra Jerry syncopal episode. He states his symptoms started1 day PTA,he felt some chills and aches, notedtemp of100.5. The morning PTA,he went to the bathroom,began to feel weak, so he thought he would sit down. He states the next thing he remembers is his family standing over him. He states that they told him they heard Tavonte Seybold loud crash and found him hanging over his walker. They laid him on the ground. It did not appear that he had any head injury. He apparently was out just Georgann Bramble few minutes and confused for April Carlyon few minutes more. His family called for EMS to bring him to the ED. Of note, he reports feeling Dortha Neighbors "pinprick" pain in his left chest and flutteringafter he regained consciousness. He says it wasn't intense, but it happen 4 times athome and once in the ED. He's not had either of these symptoms before.Of note, he was recently seen by his cardiologist on 02/27/2021 and was cleared for Besnik Febus  Port-Duong Haydel-Cath placement as well as forfuture pancreatic surgery.Patient recently had his first chemotherapy infusion on 03/01/2021 for pancreatic cancer. In the ED,CTheadwas negative. CXR was negative. COVID was negative. Trp were mildly elevated. He was found to be in afib,also noted positive orthostatic hypotension.TRH was called for admission. Cardiology was consulted for new afib.  He was admitted with Corleone Biegler syncopal episode and found to have significant orthostatic hypotension.  He also c/o fever/chills prior to admission and had right upper extremity erythema concerning for cellulitis (vs related to gemcitabine rash).  Cardiology and oncology were consulted.  His BP meds were stopped and he was started on midodrine and his orthostatic hypotension was resolved.  He was started on eliquis for new onset afib.  He was started on abx for possible cellulitis.  Discharged on 3/24 in stable condition.    See below for additional detail s  Hospital Course:  Syncope likely 2/2 orthostatic hypotension(DM neuropathy) Repeat orthostatic vitals negative CT head negative Echo done on 03/04/2021 showed EF of 60 to 65%, no regional wall motion abnormalities, left ventricular diastolic parameters are indeterminate Cardiology consulted, started patient on midodrine - continue 5 mg TID.  Antihypertensives have been d/c'd. Continue midodrine Hold home losartan, metoprolol, Imdur Asymptomatic, ambulating without issue  New onset paroxysmal Hermenia Fritcher. Fib Currently rate controlled TSH 1.389 Echo as above Held home metoprolol due to orthostatic hypotension Started on Eliquis - close follow up at discharge with thrombocytopenia and anemia Monitor closely  Mildly elevated troponin CAD s/p CABG in 2004 HTN/HLD Troponin with Ember Henrikson flat trend,not consistent with ACS EKG no  acute ST changes Echo as above DC aspirin due to Eliquis, continue to hold Imdur, metoprolol, losartan given orthostatic hypotension Continue  statins Follow with cardiology outpatient   Hallucinations ? Related to keflex Resolved this AM - will d/c with amox  Fever Cellulitis Erythema to right upper extremity, cellulitis vs drug reaction? No fever spikes since admission,no leukocytosis - had repeat blood cx last night 2/2 "chills" Possibly related to chemotherapy,rule out any infection UA unremarkable, UC<10,00insignificant growth,BC x2 NGTD Follow repeat blood cx - NGTD Chest x-ray unremarkable Monitor fever curve,low threshold to start antibiotics Transition to amox at discharge as noted above  Mild transaminitis May be related to recent chemotherapy/cancer Trend daily,may need to hold statins, continue to follow  Hyponatremia/hypokalemia/hypophosphatemia Replace and follow  Elevated D-dimer Right Sided Swelling  CTA chest negative for PE Right upper and lower dopplers negative for DVT  Diabetes mellitus type 2 with neuropathy Last A1c 6.3 on3/2022 Continue SSI, Accu-Cheks, Lantus, mealtime - adjust, follow (discussed home lantus dosing, he wants to resume his prior to admission dosing, discussed importance of watching closely given reduced dose here)   Normocytic anemia/thrombocytopenia Likely 2/2 possibly recent chemotherapy use Downtrending, continue to monitor Follow anemia labs - suggestive of iron def anemia and possible B12 def (follow pending MMA)  Obesity Lifestyle modification advised  Estimated body mass index is 39.47 kg/m as calculated from the following: Height as of this encounter: 5\' 11"  (1.803 m). Weight as of 02/27/21: 128.4 kg.  Procedures: UE [place] Summary:    Right:  No evidence of deep vein thrombosis in the upper extremity. No evidence of  superficial vein thrombosis in the upper extremity. No evidence of  thrombosis in  the subclavian.   LE [place] Summary:  RIGHT:  - There is no evidence of deep vein thrombosis in the lower extremity.    - No cystic  structure found in the popliteal fossa.    LEFT:  - No evidence of common femoral vein obstruction  Echo IMPRESSIONS    1. Left ventricular ejection fraction, by estimation, is 60 to 65%. The  left ventricle has normal function. The left ventricle has no regional  wall motion abnormalities. Left ventricular diastolic parameters are  indeterminate.  2. Right ventricular systolic function is normal. The right ventricular  size is normal. There is normal pulmonary artery systolic pressure.  3. The mitral valve is normal in structure. No evidence of mitral valve  regurgitation.  4. The aortic valve is normal in structure. Aortic valve regurgitation is  not visualized.  5. The inferior vena cava is normal in size with greater than 50%  respiratory variability, suggesting right atrial pressure of 3 mmHg.    Consultations:  Cardiology  oncology  Discharge Exam: Vitals:   03/07/21 1149 03/07/21 1243  BP: 140/83 (!) 147/81  Pulse: 88 86  Resp:  17  Temp: 98.1 F (36.7 C) 98 F (36.7 C)  SpO2: 98% 96%   Denies any more hallucinations, thinks it started after abx yesterday Otherwise feeling well  General: No acute distress. Cardiovascular: Heart sounds show Ltanya Bayley regular rate, and rhythm. Lungs: Clear to auscultation bilaterally Abdomen: Soft, nontender, nondistended Neurological: Alert and oriented 3. Moves all extremities 4. Cranial nerves II through XII grossly intact. Extremities: erythema to R arm, relatively stable - swelling to RLE  Discharge Instructions   Discharge Instructions    Amb Referral to AFIB Clinic   Complete by: As directed    Call MD for:  difficulty breathing, headache  or visual disturbances   Complete by: As directed    Call MD for:  extreme fatigue   Complete by: As directed    Call MD for:  hives   Complete by: As directed    Call MD for:  persistant dizziness or light-headedness   Complete by: As directed    Call MD for:  persistant  nausea and vomiting   Complete by: As directed    Call MD for:  redness, tenderness, or signs of infection (pain, swelling, redness, odor or green/yellow discharge around incision site)   Complete by: As directed    Call MD for:  severe uncontrolled pain   Complete by: As directed    Call MD for:  temperature >100.4   Complete by: As directed    Diet - low sodium heart healthy   Complete by: As directed    Discharge instructions   Complete by: As directed    You were seen with orthostatic hypotension.  This has improved after we stopped your blood pressure medicine and started you on midodrine.  Continue this at home and follow up outpatient with your PCP and cardiology.  You had new atrial fibrillation.  We've started you on eliquis.  Please follow up with cardiology outpatient as planned.   We've started you on antibiotics for the rash on your right arm.  Please follow up with Dr. Truett Perna outpatient.  Return if this worsens.   You had hallucinations, this may have been related to the antibiotic.  We switched you on discharge to amoxicillin.  If this recurs, please discuss with your PCP.  You have iron deficiency and low normal vitamin b12.  We have labs pending, please follow these up with your PCP or Dr. Truett Perna outpatient.  Your platelets and hemoglobin are on the lower side, follow up repeat labs with your PCP and hematology outpatient.  Return for new, recurrent, or worsening symptoms.  Please ask your PCP to request records from this hospitalization so they know what was done and what the next steps will be.   Increase activity slowly   Complete by: As directed      Allergies as of 03/07/2021      Reactions   Furosemide Other (See Comments)   unknown   Keflex [cephalexin]    Hallucinations?   Lisinopril Other (See Comments)   Unknown   Terazosin Other (See Comments)   unknown      Medication List    STOP taking these medications   aspirin 81 MG chewable tablet    hydrochlorothiazide 25 MG tablet Commonly known as: HYDRODIURIL   isosorbide mononitrate 30 MG 24 hr tablet Commonly known as: IMDUR   losartan 100 MG tablet Commonly known as: COZAAR   metoprolol tartrate 25 MG tablet Commonly known as: LOPRESSOR   traMADol 50 MG tablet Commonly known as: Ultram     TAKE these medications   amoxicillin 500 MG capsule Commonly known as: AMOXIL Take 1 capsule (500 mg total) by mouth 3 (three) times daily for 5 days.   apixaban 5 MG Tabs tablet Commonly known as: ELIQUIS Take 1 tablet (5 mg total) by mouth 2 (two) times daily.   atorvastatin 80 MG tablet Commonly known as: LIPITOR Take 40 mg by mouth at bedtime.   BENEFIBER PO Take 1 tablet by mouth daily.   calcium carbonate 600 MG Tabs tablet Commonly known as: OS-CAL Take 600 mg by mouth 2 (two) times daily.   cycloSPORINE 0.05 % ophthalmic  emulsion Commonly known as: RESTASIS Place 1 drop into both eyes 2 (two) times daily.   ferrous sulfate 325 (65 FE) MG tablet Take 1 tablet (325 mg total) by mouth daily.   FLUoxetine 20 MG capsule Commonly known as: PROZAC Take 40 mg by mouth daily.   fluticasone 50 MCG/ACT nasal spray Commonly known as: FLONASE INSTILL 2 SPRAYS IN EACH NOSTRIL EVERY EVENING What changed: See the new instructions.   insulin glargine 100 UNIT/ML injection Commonly known as: LANTUS Inject 0.75 mLs (75 Units total) into the skin daily.   lidocaine-prilocaine cream Commonly known as: EMLA Apply to port site 1-2 hours prior to use What changed:   how much to take  how to take this  when to take this  reasons to take this  additional instructions   midodrine 5 MG tablet Commonly known as: PROAMATINE Take 1 tablet (5 mg total) by mouth 3 (three) times daily with meals.   Multi-Vitamins Tabs Take 1 tablet by mouth daily.   nitroGLYCERIN 0.4 MG SL tablet Commonly known as: NITROSTAT Place 0.4 mg under the tongue every 5 (five) minutes as  needed for chest pain.   pantoprazole 40 MG tablet Commonly known as: Protonix Take 1 tablet (40 mg total) by mouth daily.   prochlorperazine 10 MG tablet Commonly known as: COMPAZINE Take 1 tablet (10 mg total) by mouth every 6 (six) hours as needed for nausea or vomiting.   traZODone 100 MG tablet Commonly known as: DESYREL Take 100 mg by mouth at bedtime.   vitamin B-12 1000 MCG tablet Commonly known as: CYANOCOBALAMIN Take 1 tablet (1,000 mcg total) by mouth daily.      Allergies  Allergen Reactions  . Furosemide Other (See Comments)    unknown  . Keflex [Cephalexin]     Hallucinations?  . Lisinopril Other (See Comments)    Unknown  . Terazosin Other (See Comments)    unknown    Follow-up Information    Corrin Parker, PA-C Follow up on 03/25/2021.   Specialties: Physician Assistant, Cardiology Why: Please arrive 15 minutes early for your 11:15am post-hospital cardiology follow-up appointment Contact information: [address] [place] [address] [phone]        [place], [name] G, MD Follow up.   Specialty: Family Medicine Contact information: [address] [place] [address] [phone]        [place], Peter M, MD .   Specialty: Cardiology Contact information: [address] [place] [address] [phone]        Ladene Artist, MD Follow up.   Specialty: Oncology Contact information: [address] [place] [address] [phone]                The results of significant diagnostics from this hospitalization (including imaging, microbiology, ancillary and laboratory) are listed below for reference.    Significant Diagnostic Studies: CT Head Wo Contrast  Result Date: 03/04/2021 CLINICAL DATA:  75 year old male status post 1st treatment for pancreatic cancer 3 days ago. Syncope, loss of consciousness. EXAM: CT HEAD WITHOUT CONTRAST TECHNIQUE: Contiguous axial images were  obtained from the base of the skull through the vertex without intravenous contrast. COMPARISON:  None. FINDINGS: Brain: Cerebral volume is within normal limits for age. No midline shift, mass effect, or evidence of intracranial mass lesion. No ventriculomegaly. Patchy bilateral white matter hypodensity. Deep gray matter nuclei relatively spared. No acute or chronic cortical infarct identified. No acute intracranial hemorrhage identified. Vascular:  Calcified atherosclerosis at the skull base. No suspicious intracranial vascular hyperdensity. Skull: No acute or suspicious osseous lesion. Sinuses/Orbits: Visualized paranasal sinuses and mastoids are clear. Other: No acute orbit or scalp soft tissue finding. Postoperative changes to both globes. IMPRESSION: Moderate for age cerebral white matter changes most commonly due to small vessel disease. No acute or metastatic intracranial abnormality identified on this noncontrast exam. Electronically Signed   By: Odessa [name] M.D.   On: 03/04/2021 05:40   CT ANGIO CHEST PE W OR WO CONTRAST  Result Date: 03/05/2021 CLINICAL DATA:  75 year old male with fever, syncope following 1st chemotherapy for pancreatic cancer. Abnormal D-dimer. EXAM: CT ANGIOGRAPHY CHEST WITH CONTRAST TECHNIQUE: Multidetector CT imaging of the chest was performed using the standard protocol during bolus administration of intravenous contrast. Multiplanar CT image reconstructions and MIPs were obtained to evaluate the vascular anatomy. CONTRAST:  OMNIPAQUE IOHEXOL 350 MG/ML SOLN COMPARISON:  Portable chest yesterday. Staging CT Chest, Abdomen, and Pelvis 01/29/2021. FINDINGS: Cardiovascular: Adequate contrast bolus timing in the pulmonary arterial tree. Intermittent mild respiratory motion in the lower lungs. No focal filling defect identified in the pulmonary arteries to suggest acute pulmonary embolism. Sequelae of CABG. Calcified aortic atherosclerosis. Borderline to mild cardiomegaly. No  pericardial effusion. Right chest Port-Daequan Kozma-Cath now in place. Mediastinum/Nodes: Negative.  No lymphadenopathy. Lungs/Pleura: Stable lung volumes from last month. Mild atelectatic changes to the major airways today which remain patent. Mild motion artifact at the lung bases. Occasional atelectasis and scarring in both lungs appears stable. No pleural effusion or acute pulmonary opacity. Upper Abdomen: Stable visible liver, spleen and stomach. Absent gallbladder. Negative visible splenic flexure. Pancreas not included. Musculoskeletal: Prior sternotomy. Stable visualized osseous structures. Review of the MIP images confirms the above findings. IMPRESSION: 1. No evidence of acute pulmonary embolus. 2. Stable chest from last month.  No acute or metastatic findings. 3. Aortic Atherosclerosis (ICD10-I70.0). Electronically Signed   By: Odessa [name] M.D.   On: 03/05/2021 05:29   DG Chest Portable 1 View  Result Date: 03/04/2021 CLINICAL DATA:  Fever following first chemotherapy for pancreatic cancer EXAM: PORTABLE CHEST 1 VIEW COMPARISON:  Radiograph 10/12/2020 FINDINGS: Low volumes and atelectatic change without focal consolidative opacity. No convincing features of edema. No pneumothorax or effusion. Stable cardiomegaly with postsurgical changes from prior sternotomy and CABG. Fracture of the superior most sternal suture is unchanged from comparison exam. Bena Kobel right IJ approach Port-Cain Fitzhenry-Cath tip terminates in the mid SVC. Telemetry leads overlie the chest. No acute osseous or soft tissue abnormality. Degenerative changes are present in the imaged spine and shoulders. IMPRESSION: 1. Low lung volumes and atelectatic change. No other acute cardiopulmonary abnormality. 2. Stable cardiomegaly. 3. Prior sternotomy and CABG. Electronically Signed   By: Kreg Shropshire M.D.   On: 03/04/2021 05:20   DG CHEST PORT 1 VIEW  Result Date: 02/21/2021 CLINICAL DATA:  Post RIGHT Port-Anjela Cassara-Cath insertion EXAM: PORTABLE CHEST 1 VIEW COMPARISON:   Chest CT 01/29/2021 FINDINGS: Port in the anterior chest wall with tip in distal SVC. Sternotomy wires overlie normal cardiac silhouette. Normal pulmonary vasculature. No effusion, infiltrate, or pneumothorax. No acute osseous abnormality. IMPRESSION: No complication following port placement. Electronically Signed   By: Genevive Bi M.D.   On: 02/21/2021 15:11   DG Fluoro Guide CV Line-No Report  Result Date: 02/21/2021 Fluoroscopy was utilized by the requesting physician.  No radiographic interpretation.   ECHOCARDIOGRAM COMPLETE  Result Date: 03/04/2021    ECHOCARDIOGRAM REPORT   Patient Name:   [name]  [facility] Date of Exam: 03/04/2021 Medical Rec #:  [id number]             Height:       71.0 in Accession #:    [id number]            Weight:       283.0 lb Date of Birth:  04-Jun-1946            BSA:          2.444 m Patient Age:    74 years              BP:           117/63 mmHg Patient Gender: M                     HR:           79 bpm. Exam Location:  Inpatient Procedure: 2D Echo, Cardiac Doppler and Color Doppler Indications:    R55 Syncope  History:        Patient has no prior history of Echocardiogram examinations.                 Previous Myocardial Infarction and CAD; Risk                 Factors:Hypertension, Dyslipidemia and Diabetes.  Sonographer:    Eulah Pont RDCS Referring Phys: [id number] Teddy Spike IMPRESSIONS  1. Left ventricular ejection fraction, by estimation, is 60 to 65%. The left ventricle has normal function. The left ventricle has no regional wall motion abnormalities. Left ventricular diastolic parameters are indeterminate.  2. Right ventricular systolic function is normal. The right ventricular size is normal. There is normal pulmonary artery systolic pressure.  3. The mitral valve is normal in structure. No evidence of mitral valve regurgitation.  4. The aortic valve is normal in structure. Aortic valve regurgitation is not visualized.  5. The inferior vena cava is  normal in size with greater than 50% respiratory variability, suggesting right atrial pressure of 3 mmHg. FINDINGS  Left Ventricle: Left ventricular ejection fraction, by estimation, is 60 to 65%. The left ventricle has normal function. The left ventricle has no regional wall motion abnormalities. The left ventricular internal cavity size was normal in size. There is  no left ventricular hypertrophy. Left ventricular diastolic parameters are indeterminate. Right Ventricle: The right ventricular size is normal. Right vetricular wall thickness was not assessed. Right ventricular systolic function is normal. There is normal pulmonary artery systolic pressure. The tricuspid regurgitant velocity is 2.28 m/s, and with an assumed right atrial pressure of 3 mmHg, the estimated right ventricular systolic pressure is 23.8 mmHg. Left Atrium: Left atrial size was normal in size. Right Atrium: Right atrial size was normal in size. Pericardium: There is no evidence of pericardial effusion. Mitral Valve: The mitral valve is normal in structure. No evidence of mitral valve regurgitation. Tricuspid Valve: The tricuspid valve is normal in structure. Tricuspid valve regurgitation is trivial. Aortic Valve: The aortic valve is normal in structure. Aortic valve regurgitation is not visualized. Pulmonic Valve: The pulmonic valve was normal in structure. Pulmonic valve regurgitation is not visualized. Aorta: The aortic root and ascending aorta are structurally normal, with no evidence of dilitation. Venous: The inferior vena cava is normal in size with greater than 50% respiratory variability, suggesting right atrial pressure of 3 mmHg. IAS/Shunts: No atrial level shunt detected by color flow Doppler.  LEFT VENTRICLE PLAX 2D LVIDd:  4.70 cm  Diastology LVIDs:         3.10 cm  LV e' medial:    6.31 cm/s LV PW:         1.10 cm  LV E/e' medial:  19.0 LV IVS:        1.10 cm  LV e' lateral:   8.49 cm/s LVOT diam:     1.90 cm  LV E/e'  lateral: 14.1 LV SV:         71 LV SV Index:   29 LVOT Area:     2.84 cm  RIGHT VENTRICLE RV S prime:     12.90 cm/s TAPSE (M-mode): 1.5 cm LEFT ATRIUM             Index       RIGHT ATRIUM           Index LA diam:        5.60 cm 2.29 cm/m  RA Area:     14.00 cm LA Vol (A2C):   72.6 ml 29.71 ml/m RA Volume:   32.40 ml  13.26 ml/m LA Vol (A4C):   76.6 ml 31.35 ml/m LA Biplane Vol: 79.2 ml 32.41 ml/m  AORTIC VALVE LVOT Vmax:   133.00 cm/s LVOT Vmean:  95.000 cm/s LVOT VTI:    0.250 m  AORTA Ao Root diam: 3.30 cm Ao Asc diam:  3.30 cm MITRAL VALVE                TRICUSPID VALVE MV Area (PHT): 4.60 cm     TR Peak grad:   20.8 mmHg MV Decel Time: 165 msec     TR Vmax:        228.00 cm/s MV E velocity: 120.00 cm/s MV Yareth Macdonnell velocity: 65.60 cm/s   SHUNTS MV E/Krisann Mckenna ratio:  1.83         Systemic VTI:  0.25 m                             Systemic Diam: 1.90 cm Dietrich Pates MD Electronically signed by Dietrich Pates MD Signature Date/Time: 03/04/2021/4:25:39 PM    Final    VAS [place] LOWER EXTREMITY VENOUS (DVT)  Result Date: 03/05/2021  Lower Venous DVT Study Indications: Swelling.  Risk Factors: None identified. Limitations: Poor ultrasound/tissue interface and body habitus. Comparison Study: No prior studies. Performing Technologist: Chanda Busing RVT  Examination Guidelines: Kinisha Soper complete evaluation includes B-mode imaging, spectral Doppler, color Doppler, and power Doppler as needed of all accessible portions of each vessel. Bilateral testing is considered an integral part of Camren Lipsett complete examination. Limited examinations for reoccurring indications may be performed as noted. The reflux portion of the exam is performed with the patient in reverse Trendelenburg.  +---------+---------------+---------+-----------+----------+--------------+ RIGHT    CompressibilityPhasicitySpontaneityPropertiesThrombus Aging +---------+---------------+---------+-----------+----------+--------------+ CFV      Full           Yes      Yes                                  +---------+---------------+---------+-----------+----------+--------------+ SFJ      Full                                                        +---------+---------------+---------+-----------+----------+--------------+  FV Prox  Full                                                        +---------+---------------+---------+-----------+----------+--------------+ FV Mid   Full                                                        +---------+---------------+---------+-----------+----------+--------------+ FV DistalFull                                                        +---------+---------------+---------+-----------+----------+--------------+ PFV      Full                                                        +---------+---------------+---------+-----------+----------+--------------+ POP      Full           Yes      Yes                                 +---------+---------------+---------+-----------+----------+--------------+ PTV      Full                                                        +---------+---------------+---------+-----------+----------+--------------+ PERO     Full                                                        +---------+---------------+---------+-----------+----------+--------------+   +----+---------------+---------+-----------+----------+--------------+ LEFTCompressibilityPhasicitySpontaneityPropertiesThrombus Aging +----+---------------+---------+-----------+----------+--------------+ CFV Full           Yes      Yes                                 +----+---------------+---------+-----------+----------+--------------+     Summary: RIGHT: - There is no evidence of deep vein thrombosis in the lower extremity.  - No cystic structure found in the popliteal fossa.  LEFT: - No evidence of common femoral vein obstruction.  *See table(s) above for measurements and observations. Electronically  signed by Waverly Ferrari MD on 03/05/2021 at 3:46:13 PM.    Final    VAS [place] UPPER EXTREMITY VENOUS DUPLEX  Result Date: 03/06/2021 UPPER VENOUS STUDY  Indications: Swelling Comparison Study: no prior Performing Technologist: Blanch Media RVS  Examination Guidelines: Elanna Bert complete evaluation includes B-mode imaging, spectral Doppler, color Doppler, and power Doppler as needed of all accessible portions of each vessel. Bilateral testing  is considered an integral part of Kamauri Kathol complete examination. Limited examinations for reoccurring indications may be performed as noted.  Right Findings: +----------+------------+---------+-----------+----------+-------+ RIGHT     CompressiblePhasicitySpontaneousPropertiesSummary +----------+------------+---------+-----------+----------+-------+ IJV           Full       Yes       Yes                      +----------+------------+---------+-----------+----------+-------+ Subclavian    Full       Yes       Yes                      +----------+------------+---------+-----------+----------+-------+ Axillary      Full       Yes       Yes                      +----------+------------+---------+-----------+----------+-------+ Brachial      Full                                          +----------+------------+---------+-----------+----------+-------+ Radial        Full                                          +----------+------------+---------+-----------+----------+-------+ Ulnar         Full                                          +----------+------------+---------+-----------+----------+-------+ Cephalic      Full                                          +----------+------------+---------+-----------+----------+-------+ Basilic       Full                                          +----------+------------+---------+-----------+----------+-------+  Summary:  Right: No evidence of deep vein thrombosis in the upper extremity. No  evidence of superficial vein thrombosis in the upper extremity. No evidence of thrombosis in the subclavian.  *See table(s) above for measurements and observations.  Diagnosing physician: Gretta Began MD Electronically signed by Gretta Began MD on 03/06/2021 at 2:09:29 PM.    Final     Microbiology: Recent Results (from the past 240 hour(s))  Blood culture (routine x 2)     Status: None (Preliminary result)   Collection Time: 03/04/21  4:34 AM   Specimen: BLOOD  Result Value Ref Range Status   Specimen Description   Final    BLOOD LEFT ARM Performed at [facility], [address], [place], [place] [address]    Special Requests   Final    BOTTLES DRAWN AEROBIC AND ANAEROBIC Blood Culture results may not be optimal due to an inadequate volume of blood received in culture bottles Performed at [facility], [address], [place], [place] [address]    Culture   Final  NO GROWTH 3 DAYS Performed at [facility], [address], [place], [place] [address]    Report Status PENDING  Incomplete  Blood culture (routine x 2)     Status: None (Preliminary result)   Collection Time: 03/04/21  5:03 AM   Specimen: BLOOD  Result Value Ref Range Status   Specimen Description   Final    BLOOD RIGHT ANTECUBITAL Performed at [facility], [address] [place], [place] [address]    Special Requests   Final    BOTTLES DRAWN AEROBIC AND ANAEROBIC Blood Culture adequate volume Performed at [facility], [address], [place], [place] [address]    Culture   Final    NO GROWTH 3 DAYS Performed at [facility], [address] [place], [place] [address]    Report Status PENDING  Incomplete  Resp Panel by RT-PCR (Flu Loghan Kurtzman&B, Covid) Nasopharyngeal Swab     Status: None   Collection Time: 03/04/21  6:08 AM   Specimen: Nasopharyngeal Swab; Nasopharyngeal(NP) swabs in vial transport medium  Result Value Ref Range  Status   SARS Coronavirus 2 by RT PCR NEGATIVE NEGATIVE Final    Comment: (NOTE) SARS-CoV-2 target nucleic acids are NOT DETECTED.  The SARS-CoV-2 RNA is generally detectable in upper respiratory specimens during the acute phase of infection. The lowest concentration of SARS-CoV-2 viral copies this assay can detect is 138 copies/mL. Telesa Jeancharles negative result does not preclude SARS-Cov-2 infection and should not be used as the sole basis for treatment or other patient management decisions. Shalik Sanfilippo negative result may occur with  improper specimen collection/handling, submission of specimen other than nasopharyngeal swab, presence of viral mutation(s) within the areas targeted by this assay, and inadequate number of viral copies(<138 copies/mL). Oddis Westling negative result must be combined with clinical observations, patient history, and epidemiological information. The expected result is Negative.  Fact Sheet for Patients:  BloggerCourse.com  Fact Sheet for Healthcare Providers:  SeriousBroker.it  This test is no t yet approved or cleared by the [place] FDA and  has been authorized for detection and/or diagnosis of SARS-CoV-2 by FDA under an Emergency Use Authorization (EUA). This EUA will remain  in effect (meaning this test can be used) for the duration of the COVID-19 declaration under Section 564(b)(1) of the Act, 21 U.S.C.section 360bbb-3(b)(1), unless the authorization is terminated  or revoked sooner.       Influenza Jaci Desanto by PCR NEGATIVE NEGATIVE Final   Influenza B by PCR NEGATIVE NEGATIVE Final    Comment: (NOTE) The Xpert Xpress SARS-CoV-2/FLU/RSV plus assay is intended as an aid in the diagnosis of influenza from Nasopharyngeal swab specimens and should not be used as Jenisa Monty sole basis for treatment. Nasal washings and aspirates are unacceptable for Xpert Xpress SARS-CoV-2/FLU/RSV testing.  Fact Sheet for  Patients: BloggerCourse.com  Fact Sheet for Healthcare Providers: SeriousBroker.it  This test is not yet approved or cleared by the [place] FDA and has been authorized for detection and/or diagnosis of SARS-CoV-2 by FDA under an Emergency Use Authorization (EUA). This EUA will remain in effect (meaning this test can be used) for the duration of the COVID-19 declaration under Section 564(b)(1) of the Act, 21 U.S.C. section 360bbb-3(b)(1), unless the authorization is terminated or revoked.  Performed at [name], [address] [place], [place] [address]   Urine Culture     Status: Abnormal   Collection Time: 03/04/21  7:50 AM   Specimen: Urine, Clean  Catch  Result Value Ref Range Status   Specimen Description   Final    URINE, CLEAN CATCH Performed at [facility], [address], [place], [place] [address]    Special Requests   Final    NONE Performed at [facility], [address] [place], [place] [address]    Culture (Soila Printup)  Final    <10,000 COLONIES/mL INSIGNIFICANT GROWTH Performed at [facility], [address], [place], [place] [address]    Report Status 03/05/2021 FINAL  Final  Culture, blood (routine x 2)     Status: None (Preliminary result)   Collection Time: 03/05/21  9:49 PM   Specimen: BLOOD  Result Value Ref Range Status   Specimen Description   Final    BLOOD RIGHT ARM Performed at [facility], [address], [place], [place] [address]    Special Requests   Final    BOTTLES DRAWN AEROBIC ONLY Blood Culture adequate volume Performed at [facility], [address], [place], [place] [address]    Culture   Final    NO GROWTH 1 DAY Performed at [facility], [address] [place], [place] [address]    Report Status PENDING  Incomplete  Culture, blood (routine x 2)     Status: None  (Preliminary result)   Collection Time: 03/05/21  9:52 PM   Specimen: BLOOD LEFT HAND  Result Value Ref Range Status   Specimen Description   Final    BLOOD LEFT HAND Performed at [facility], [address] [place], [place] [address]    Special Requests   Final    BOTTLES DRAWN AEROBIC AND ANAEROBIC Blood Culture adequate volume Performed at [facility], [address], [place], [place] [address]    Culture   Final    NO GROWTH 1 DAY Performed at [facility], [address], [place], [place] [address]    Report Status PENDING  Incomplete     Labs: Basic Metabolic Panel: Recent Labs  Lab 03/04/21 0434 03/04/21 0615 03/04/21 2340 03/05/21 0500 03/06/21 0541 03/07/21 0539  NA 133*  --  131* 131* 132* 138  K 3.3*  --  3.2* 3.2* 3.7 3.6  CL 102  --  100 100 104 105  CO2 24  --  24 24 22 24   GLUCOSE 138*  --  199* 121* 236* 130*  BUN 19  --  18 17 12 10   CREATININE 1.17  --  1.19 1.17 1.16 1.17  CALCIUM 8.2*  --  8.2* 8.4* 8.1* 8.1*  MG  --  1.5*  --  1.8 1.6* 2.0  PHOS  --   --   --  1.7* 1.8* 2.4*   Liver Function Tests: Recent Labs  Lab 03/01/21 1228 03/04/21 0434 03/05/21 0500 03/06/21 0541 03/07/21 0539  AST 25 81* 67* 45* 54*  ALT 19 50* 64* 51* 59*  ALKPHOS 69 50 55 62 74  BILITOT 1.0 2.5* 1.6* 1.4* 1.6*  PROT 7.0 6.2* 6.4* 6.0* 6.0*  ALBUMIN 3.6 3.1* 3.2* 2.9* 3.0*   No results for input(s): LIPASE, AMYLASE in the last 168 hours. No results for input(s): AMMONIA in the last 168 hours. CBC: Recent Labs  Lab 03/01/21 1228 03/04/21 0434 03/05/21 0500 03/06/21 0541 03/07/21 0539  WBC 6.8 7.7 5.8 3.8* 2.6*  NEUTROABS 5.0 7.2  --  3.3 1.9  HGB 11.1* 10.5* 9.4* 8.8* 8.8*  HCT 32.7* 30.2* 27.4* 25.7* 25.2*  MCV 92.6 92.1 92.9 93.1 91.0  PLT 163 104* 100* 97* 90*   Cardiac Enzymes: No results for input(s): CKTOTAL, CKMB, CKMBINDEX, TROPONINI in the last 168 hours. BNP: BNP (last 3 results) No results  for input(s): BNP in the last 8760 hours.  ProBNP (last 3 results) No results for input(s): PROBNP in the last 8760 hours.  CBG: Recent Labs  Lab 03/06/21 1224 03/06/21 1651 03/06/21 2030 03/07/21 0735 03/07/21 1131  GLUCAP 247* 195* 181* 144* 221*       Signed:  Lacretia Nicks MD.  [facility] 03/07/2021, 3:15 PM

## 2021-03-08 NOTE — Telephone Encounter (Signed)
Patient contacted regarding discharge from [facility] on 03/24.  Patient understands to follow up with provider Sande Rives, PA on 04/11 at 11:15 AM at [facility] office. Patient understands discharge instructions? Yes Patient understands medications and regiment? Yes Patient understands to bring all medications to this visit? Yes

## 2021-03-09 ENCOUNTER — Other Ambulatory Visit: Payer: Self-pay | Admitting: Oncology

## 2021-03-09 LAB — CULTURE, BLOOD (ROUTINE X 2)
Culture: NO GROWTH
Culture: NO GROWTH
Special Requests: ADEQUATE

## 2021-03-11 LAB — CULTURE, BLOOD (ROUTINE X 2)
Culture: NO GROWTH
Culture: NO GROWTH
Special Requests: ADEQUATE
Special Requests: ADEQUATE

## 2021-03-11 LAB — METHYLMALONIC ACID, SERUM: Methylmalonic Acid, Quantitative: 130 nmol/L (ref 0–378)

## 2021-03-12 NOTE — Progress Notes (Signed)
Cardiology Office Note:    Date:  03/25/2021   ID:  [name], [name] Feb 27, 1946, MRN [id number]  PCP:  [name], Bill G, MD  Cardiologist:  Bill [name], MD  Electrophysiologist:  None   Referring MD: [name], Bill G, MD   Chief Complaint: hospital follow-up for orthostatic hypotension and atrial fibrillation  History of Present Illness:    [name] is a 75 y.o. male with a history of CAD s/p CABG in 2004 with last cardiac catheterization in 06/2018 with patent LIMA-LAD and SVG-RCA grafts and 40-50% proximal stenosis of SVG to Ramus, paroxysmal atrial fibrillation on Eliquis, hypertension with recent orthostatic hypotension now on Midodrine, hyperlipidemia, type 2 diabetes mellitus on insulin, CKD stage III,  pancreatic cancer diagnosed in 12/2020, PTSD, and Agent Orange exposure during [place] war who is followed by Dr. [name] and presents today for hospital hospital follow-up of orthostatic hypotension and atrial fibrillation.  Patient was last seen by 02/27/2021 at which time he was doing well from a cardiac standpoint and was to be at acceptable risk for pancreatic surgery. Since last visit, patient admitted from 03/04/2021 to 03/07/2021 for syncopal episode due to severe orthostatic hypotension and new onset atrial fibrillation. High-sensitivity troponin flat in the 40's. D-dimer was elevated but CT negative for PE. Echo showed LVEF of 60-65% with normal wall motion. All home antihypertensive were held and he was treated with IV fluids. Ultimately started Midodrine and orthostatic hypotension resolved. He was started on Eliquis for atrial fibrillation. He was also started on antibiotic for possible cellulitis.  Patient presents today for follow-up. Here with his wife. Patient has been doing well since discharge. No recurrent near syncope/syncope. No palpitations, lightheadedness, or dizziness. No chest pain or shortness of breath. Patient uses CPAP at night. No orthopnea or  PND. He has had some right lower extremity edema. Oncologist recently told him to take Benadryl for this and that has helped the edema. No pain in legs. Dopplers negative for DVT on right leg in the hospital.  Past Medical History:  Diagnosis Date  . Anemia   . Arthritis   . Cancer [facility])    prostate  . Chronic kidney disease    blood in urine   . Constipation   . Coronary artery disease   . Depression   . Diabetes mellitus without complication ([place])   . Difficult intubation    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 it was hard to get the tube down his throat  . Fatty liver   . Frequent headaches   . GERD (gastroesophageal reflux disease)   . History of chicken pox   . History of fainting spells of unknown cause   . Hyperlipidemia   . Hypertension   . Myocardial infarction ([place])   . Pneumonia   . PTSD (post-traumatic stress disorder)   . Sleep apnea    uses Cpap    Past Surgical History:  Procedure Laterality Date  . APPENDECTOMY    . BIOPSY  01/15/2021   Procedure: BIOPSY;  Surgeon: Rush Landmark Telford Nab., MD;  Location: [place];  Service: Gastroenterology;;  . CARDIAC SURGERY     Triple Bypass  . CHOLECYSTECTOMY  2010  . CORONARY ARTERY BYPASS GRAFT  2004  . ESOPHAGOGASTRODUODENOSCOPY (EGD) WITH PROPOFOL N/A 01/15/2021   Procedure: ESOPHAGOGASTRODUODENOSCOPY (EGD) WITH PROPOFOL;  Surgeon: Rush Landmark Telford Nab., MD;  Location: [place];  Service: Gastroenterology;  Laterality: N/A;  . EUS N/A 01/15/2021   Procedure: UPPER ENDOSCOPIC ULTRASOUND (EUS) RADIAL;  Surgeon: Rush Landmark,  Telford Nab., MD;  Location[facility] ENDOSCOPY;  Service: Gastroenterology;  Laterality: N/A;  . FINE NEEDLE ASPIRATION  01/15/2021   Procedure: FINE NEEDLE ASPIRATION (FNA) LINEAR;  Surgeon: Irving Copas., MD;  Location: [place];  Service: Gastroenterology;;  . Sol Passer PLACEMENT Right 02/21/2021   Procedure: INSERTION PORT-A-CATH;  Surgeon: Dwan Bolt, MD;  Location: [place];  Service: General;   Laterality: Right;  . TONSILLECTOMY  1958    Current Medications: Current Meds  Medication Sig  . apixaban (ELIQUIS) 5 MG TABS tablet Take 1 tablet (5 mg total) by mouth 2 (two) times daily.  Marland Kitchen atorvastatin (LIPITOR) 80 MG tablet Take 40 mg by mouth at bedtime.  . calcium carbonate (OS-CAL) 600 MG TABS tablet Take 600 mg by mouth 2 (two) times daily.  . cycloSPORINE (RESTASIS) 0.05 % ophthalmic emulsion Place 1 drop into both eyes 2 (two) times daily.  . ferrous sulfate 325 (65 FE) MG tablet Take 1 tablet (325 mg total) by mouth daily.  Marland Kitchen FLUoxetine (PROZAC) 20 MG capsule Take 40 mg by mouth daily.  . fluticasone (FLONASE) 50 MCG/ACT nasal spray INSTILL 2 SPRAYS IN EACH NOSTRIL EVERY EVENING (Patient taking differently: Place 2 sprays into both nostrils daily as needed for allergies.)  . insulin glargine (LANTUS) 100 UNIT/ML injection Inject 0.75 mLs (75 Units total) into the skin daily. (Patient taking differently: Inject 75 Units into the skin daily.)  . lidocaine-prilocaine (EMLA) cream Apply to port site 1-2 hours prior to use (Patient taking differently: Apply 1 application topically as needed (port access).)  . midodrine (PROAMATINE) 5 MG tablet Take 1 tablet (5 mg total) by mouth 3 (three) times daily with meals.  . Multiple Vitamin (MULTI-VITAMINS) TABS Take 1 tablet by mouth daily.  . nitroGLYCERIN (NITROSTAT) 0.4 MG SL tablet Place 0.4 mg under the tongue every 5 (five) minutes as needed for chest pain.  . pantoprazole (PROTONIX) 40 MG tablet Take 1 tablet (40 mg total) by mouth daily.  . prochlorperazine (COMPAZINE) 10 MG tablet Take 1 tablet (10 mg total) by mouth every 6 (six) hours as needed for nausea or vomiting.  . traZODone (DESYREL) 100 MG tablet Take 100 mg by mouth at bedtime.  . vitamin B-12 (CYANOCOBALAMIN) 1000 MCG tablet Take 1 tablet (1,000 mcg total) by mouth daily.  . Wheat Dextrin (BENEFIBER PO) Take 1 tablet by mouth daily.     Allergies:   Furosemide, Keflex  [cephalexin], Lisinopril, and Terazosin   Social History   Socioeconomic History  . Marital status: Married    Spouse name: Not on file  . Number of children: 2  . Years of education: Not on file  . Highest education level: Not on file  Occupational History  . Occupation: retired  Tobacco Use  . Smoking status: Former Smoker    Quit date: 12/14/1975    Years since quitting: 45.3  . Smokeless tobacco: Never Used  Vaping Use  . Vaping Use: Not on file  Substance and Sexual Activity  . Alcohol use: Not Currently  . Drug use: Never  . Sexual activity: Not Currently  Other Topics Concern  . Not on file  Social History Narrative   ** Merged History Encounter **       Social Determinants of Health   Financial Resource Strain: Not on file  Food Insecurity: Not on file  Transportation Needs: Not on file  Physical Activity: Not on file  Stress: Not on file  Social Connections: Not on file  Family History: The patient's family history includes Alcohol abuse in his brother; Arthritis in his mother and sister; COPD in his brother; Cancer in his brother, mother, and sister; Depression in his daughter; Heart attack in his brother, brother, and mother; Heart disease in his brother, brother, and mother; Hyperlipidemia in his mother; Hypertension in his mother; Learning disabilities in his brother.  ROS:   Please see the history of present illness.     EKGs/Labs/Other Studies Reviewed:    The following studies were reviewed today:  Echocardiogram 03/04/2021: Impressions: 1. Left ventricular ejection fraction, by estimation, is 60 to 65%. The  left ventricle has normal function. The left ventricle has no regional  wall motion abnormalities. Left ventricular diastolic parameters are  indeterminate.  2. Right ventricular systolic function is normal. The right ventricular  size is normal. There is normal pulmonary artery systolic pressure.  3. The mitral valve is normal in  structure. No evidence of mitral valve  regurgitation.  4. The aortic valve is normal in structure. Aortic valve regurgitation is  not visualized.  5. The inferior vena cava is normal in size with greater than 50%  respiratory variability, suggesting right atrial pressure of 3 mmHg.  EKG:  EKG  ordered today. EKG personally reviewed and demonstrates normal sinus rhythm, rate 95 bpm, with no acute ST/T changes. Left axis deviation. Normal PR and QRS intervals. QTc 453ms.   Recent Labs: 03/05/2021: TSH 1.389 03/07/2021: Magnesium 2.0 03/14/2021: ALT 112; BUN 19; Creatinine 1.29; Hemoglobin 9.5; Platelet Count 231; Potassium 3.7; Sodium 138  Recent Lipid Panel    Component Value Date/Time   CHOL 113 10/10/2020 0940   CHOL 101 12/16/2018 1003   TRIG 166 (H) 10/10/2020 0940   HDL 34 (L) 10/10/2020 0940   HDL 38 (L) 12/16/2018 1003   CHOLHDL 3.3 10/10/2020 0940   LDLCALC 55 10/10/2020 0940    Physical Exam:    Vital Signs: BP (!) 154/60   Pulse 95   Ht 5\' 11"  (1.803 m)   Wt 279 lb 12.8 oz (126.9 kg)   SpO2 98%   BMI 39.02 kg/m     Wt Readings from Last 3 Encounters:  03/25/21 279 lb 12.8 oz (126.9 kg)  03/14/21 277 lb 9.6 oz (125.9 kg)  03/07/21 283 lb 1.1 oz (128.4 kg)    Orthostatic VS for the past 24 hrs:  BP- Lying Pulse- Lying BP- Sitting Pulse- Sitting BP- Standing at 0 minutes Pulse- Standing at 0 minutes  03/25/21 1145 133/79 88 126/71 89 110/63 100    General: 75 y.o. Caucasian male in no acute distress. HEENT: Normocephalic and atraumatic. Sclera clear.  Neck: Supple. No carotid bruits. No JVD. Heart: RRR. Distinct S1 and S2. No murmurs, gallops, or rubs. Radial pulses 2+ and equal bilaterally. Lungs: No increased work of breathing. Clear to ausculation bilaterally. No wheezes, rhonchi, or rales.  Abdomen: Soft, non-distended, and non-tender to palpation.  Extremities: Mild right lower extremity edema. No tenderness to palpation. Skin: Warm and dry. Neuro:  Alert and oriented x3. No focal deficits. Psych: Normal affect. Responds appropriately.  Assessment:    1. Orthostatic hypotension   2. Paroxysmal atrial fibrillation (HCC)   3. Coronary artery disease involving native coronary artery of native heart without angina pectoris   4. S/P CABG (coronary artery bypass graft)   5. Hyperlipidemia, unspecified hyperlipidemia type   6. Type 2 diabetes mellitus with complication, with long-term current use of insulin (HCC)   7. Stage 3 chronic kidney  disease, unspecified whether stage 3a or 3b CKD ([place])   8. Malignant neoplasm of pancreas, unspecified location of malignancy ([place])     Plan:    Recent Syncope Secondary to Orthostatic Hypotension - No recurrent lightheadedness, dizziness, near syncope/syncope since discharge. - Initial BP in the office 154/60. Still slightly orthostatic today in office with diastolic BP dropping from 79 when supine to 64 when standing. Asymptomatic.  - Continue Midodrine 5mg  three times daily. - Asked patient to keep a BP/HR log for 2-3 weeks and then send this to [place]. I want to make sure patient is not becoming too hypertensive. - Also recommended compression stockings.   Paroxysmal Atrial Fibrillation - Maintaining sinus rhythm today. - Recent Echo showed normal LV function. - Continue Eliquis 5mg  twice daily. Will give Patient Assistance forms in case the [facility] does not cover the Eliquis.  Right Lower Extremity Edema - Dopplers negative for DVT during recent admission. - Oncologist recommended Benadryl which helped some.  - Recommended compressions stockings.   CAD s/p CABG in 2004 - History of CABG in 2004. Last cath in 06/2018 showed patent grafts. - No angina.  - No aspirin now that patient is on Eliquis. - Beta-blocker recently stopped due to severe orthostatic hypotension. - Continue high-intensity statin.  Hyperlipidemia - Last lipid panel from 09/2020 (per [facility]): Total Cholesterol 113, Triglycerides  166, HDL 334, LDL 55. - Continue Lipitor 40mg  daily.   Diabetes Mellitus - On Insulin at home. - Management per PCP.  CKD Stage III - Last creatinine 1.17 on 03/07/2021. Baseline around 1.4 to 1.5.  Pancreatic Cancer - Management per Oncology.  Disposition: Follow up in 4-6 months with Dr. [name].   Medication Adjustments/Labs and Tests Ordered: Current medicines are reviewed at length with the patient today.  Concerns regarding medicines are outlined above.  No orders of the defined types were placed in this encounter.  No orders of the defined types were placed in this encounter.   Patient Instructions  Medication Instructions:  Continue current medications  *If you need a refill on your cardiac medications before your next appointment, please call your pharmacy*   Lab Work: None Ordered   Testing/Procedures: None Ordered   Follow-Up: At [facility], you and your health needs are our priority.  As part of our continuing mission to provide you with exceptional heart care, we have created designated Provider Care Teams.  These Care Teams include your primary Cardiologist (physician) and Advanced Practice Providers (APPs -  Physician Assistants and Nurse Practitioners) who all work together to provide you with the care you need, when you need it.  We recommend signing up for the patient portal called "MyChart".  Sign up information is provided on this After Visit Summary.  MyChart is used to connect with patients for Virtual Visits (Telemedicine).  Patients are able to view lab/test results, encounter notes, upcoming appointments, etc.  Non-urgent messages can be sent to your provider as well.   To learn more about what you can do with MyChart, go to [email or web address].    Your next appointment:   4-6 month(s)  The format for your next appointment:   In Person  Provider:   You may see Bill [name], MD or one of the following Advanced Practice Providers on your  designated Care Team:    Almyra Deforest, PA-C  Fabian Sharp, PA-C or   Roby Lofts, PA-C         Signed, Darreld Mclean, [place]  03/25/2021 11:53 AM  [facility]

## 2021-03-14 ENCOUNTER — Other Ambulatory Visit: Payer: Self-pay

## 2021-03-14 ENCOUNTER — Inpatient Hospital Stay: Payer: Medicare Other

## 2021-03-14 ENCOUNTER — Inpatient Hospital Stay (HOSPITAL_BASED_OUTPATIENT_CLINIC_OR_DEPARTMENT_OTHER): Payer: Medicare Other | Admitting: Oncology

## 2021-03-14 VITALS — BP 151/76 | HR 97 | Temp 97.6°F | Resp 18 | Ht 71.0 in | Wt 277.6 lb

## 2021-03-14 DIAGNOSIS — C250 Malignant neoplasm of head of pancreas: Secondary | ICD-10-CM

## 2021-03-14 DIAGNOSIS — Z95828 Presence of other vascular implants and grafts: Secondary | ICD-10-CM

## 2021-03-14 DIAGNOSIS — I25810 Atherosclerosis of coronary artery bypass graft(s) without angina pectoris: Secondary | ICD-10-CM

## 2021-03-14 DIAGNOSIS — I10 Essential (primary) hypertension: Secondary | ICD-10-CM | POA: Diagnosis not present

## 2021-03-14 DIAGNOSIS — E119 Type 2 diabetes mellitus without complications: Secondary | ICD-10-CM | POA: Diagnosis not present

## 2021-03-14 DIAGNOSIS — Z5111 Encounter for antineoplastic chemotherapy: Secondary | ICD-10-CM | POA: Diagnosis not present

## 2021-03-14 DIAGNOSIS — G629 Polyneuropathy, unspecified: Secondary | ICD-10-CM | POA: Diagnosis not present

## 2021-03-14 DIAGNOSIS — Z8546 Personal history of malignant neoplasm of prostate: Secondary | ICD-10-CM | POA: Diagnosis not present

## 2021-03-14 DIAGNOSIS — C259 Malignant neoplasm of pancreas, unspecified: Secondary | ICD-10-CM | POA: Diagnosis not present

## 2021-03-14 LAB — CMP (CANCER CENTER ONLY)
ALT: 112 U/L — ABNORMAL HIGH (ref 0–44)
AST: 92 U/L — ABNORMAL HIGH (ref 15–41)
Albumin: 3.3 g/dL — ABNORMAL LOW (ref 3.5–5.0)
Alkaline Phosphatase: 111 U/L (ref 38–126)
Anion gap: 10 (ref 5–15)
BUN: 19 mg/dL (ref 8–23)
CO2: 26 mmol/L (ref 22–32)
Calcium: 8.7 mg/dL — ABNORMAL LOW (ref 8.9–10.3)
Chloride: 102 mmol/L (ref 98–111)
Creatinine: 1.29 mg/dL — ABNORMAL HIGH (ref 0.61–1.24)
GFR, Estimated: 57 mL/min — ABNORMAL LOW (ref >60)
Glucose, Bld: 265 mg/dL — ABNORMAL HIGH (ref 70–99)
Potassium: 3.7 mmol/L (ref 3.5–5.1)
Sodium: 138 mmol/L (ref 135–145)
Total Bilirubin: 1.1 mg/dL (ref 0.3–1.2)
Total Protein: 6.5 g/dL (ref 6.5–8.1)

## 2021-03-14 LAB — CBC WITH DIFFERENTIAL (CANCER CENTER ONLY)
Abs Immature Granulocytes: 0.05 10*3/uL (ref 0.00–0.07)
Basophils Absolute: 0 10*3/uL (ref 0.0–0.1)
Basophils Relative: 0 %
Eosinophils Absolute: 0.1 10*3/uL (ref 0.0–0.5)
Eosinophils Relative: 2 %
HCT: 28.5 % — ABNORMAL LOW (ref 39.0–52.0)
Hemoglobin: 9.5 g/dL — ABNORMAL LOW (ref 13.0–17.0)
Immature Granulocytes: 2 %
Lymphocytes Relative: 22 %
Lymphs Abs: 0.7 10*3/uL (ref 0.7–4.0)
MCH: 31 pg (ref 26.0–34.0)
MCHC: 33.3 g/dL (ref 30.0–36.0)
MCV: 93.1 fL (ref 80.0–100.0)
Monocytes Absolute: 0.4 10*3/uL (ref 0.1–1.0)
Monocytes Relative: 13 %
Neutro Abs: 2.1 10*3/uL (ref 1.7–7.7)
Neutrophils Relative %: 61 %
Platelet Count: 231 10*3/uL (ref 150–400)
RBC: 3.06 MIL/uL — ABNORMAL LOW (ref 4.22–5.81)
RDW: 17.2 % — ABNORMAL HIGH (ref 11.5–15.5)
WBC Count: 3.4 10*3/uL — ABNORMAL LOW (ref 4.0–10.5)
nRBC: 0.6 % — ABNORMAL HIGH (ref 0.0–0.2)

## 2021-03-14 MED ORDER — HEPARIN SOD (PORK) LOCK FLUSH 100 UNIT/ML IV SOLN
500.0000 [IU] | Freq: Once | INTRAVENOUS | Status: AC | PRN
  Administered 2021-03-14: 500 [IU]
  Filled 2021-03-14: qty 5

## 2021-03-14 MED ORDER — SODIUM CHLORIDE 0.9% FLUSH
10.0000 mL | INTRAVENOUS | Status: DC | PRN
  Administered 2021-03-14: 10 mL
  Filled 2021-03-14: qty 10

## 2021-03-14 MED ORDER — PROCHLORPERAZINE MALEATE 10 MG PO TABS
10.0000 mg | ORAL_TABLET | Freq: Once | ORAL | Status: AC
  Administered 2021-03-14: 10 mg via ORAL

## 2021-03-14 MED ORDER — PACLITAXEL PROTEIN-BOUND CHEMO INJECTION 100 MG
100.0000 mg/m2 | Freq: Once | INTRAVENOUS | Status: AC
Start: 2021-03-14 — End: 2021-03-14
  Administered 2021-03-14: 250 mg via INTRAVENOUS
  Filled 2021-03-14: qty 50

## 2021-03-14 MED ORDER — SODIUM CHLORIDE 0.9 % IV SOLN
Freq: Once | INTRAVENOUS | Status: AC
Start: 2021-03-14 — End: 2021-03-14
  Filled 2021-03-14: qty 250

## 2021-03-14 MED ORDER — SODIUM CHLORIDE 0.9 % IV SOLN
1000.0000 mg/m2 | Freq: Once | INTRAVENOUS | Status: AC
  Administered 2021-03-14: 2546 mg via INTRAVENOUS
  Filled 2021-03-14: qty 66.96

## 2021-03-14 MED ORDER — PROCHLORPERAZINE MALEATE 10 MG PO TABS
ORAL_TABLET | ORAL | Status: AC
  Filled 2021-03-14: qty 1

## 2021-03-14 MED ORDER — SODIUM CHLORIDE 0.9% FLUSH
10.0000 mL | Freq: Once | INTRAVENOUS | Status: AC
  Administered 2021-03-14: 10 mL
  Filled 2021-03-14: qty 10

## 2021-03-14 NOTE — Progress Notes (Signed)
  [facility] OFFICE PROGRESS NOTE   Diagnosis: Pancreas cancer  INTERVAL HISTORY:   Mr. Kelley was discharged from the hospital on 03/07/2021.  He completed an outpatient course of amoxicillin.  The right arm erythema has resolved.  No further syncopal events.  No bleeding.  He has continued mild swelling of the right lower leg.  Objective:  Vital signs in last 24 hours:  Blood pressure (!) 151/76, pulse 97, temperature 97.6 F (36.4 C), temperature source Tympanic, resp. rate 18, height 5\' 11"  (1.803 m), weight 277 lb 9.6 oz (125.9 kg), SpO2 100 %.    HEENT: No thrush or ulcers Resp: Lungs clear bilaterally Cardio: Regular rate and rhythm GI: No hepatosplenomegaly, nontender Vascular: Trace pitting edema at the right greater than left lower leg with mild right pretibial erythema  Skin: Almost completely resolved erythema at the right upper arm  Portacath/PICC-without erythema  Lab Results:  Lab Results  Component Value Date   WBC 3.4 (L) 03/14/2021   HGB 9.5 (L) 03/14/2021   HCT 28.5 (L) 03/14/2021   MCV 93.1 03/14/2021   PLT 231 03/14/2021   NEUTROABS 2.1 03/14/2021    CMP  Lab Results  Component Value Date   NA 138 03/14/2021   K 3.7 03/14/2021   CL 102 03/14/2021   CO2 26 03/14/2021   GLUCOSE 265 (H) 03/14/2021   BUN 19 03/14/2021   CREATININE 1.29 (H) 03/14/2021   CALCIUM 8.7 (L) 03/14/2021   PROT 6.5 03/14/2021   ALBUMIN 3.3 (L) 03/14/2021   AST 92 (H) 03/14/2021   ALT 112 (H) 03/14/2021   ALKPHOS 111 03/14/2021   BILITOT 1.1 03/14/2021   GFRNONAA 57 (L) 03/14/2021   GFRAA 49 (L) 10/10/2020    Medications: I have reviewed the patient's current medications.   Assessment/Plan: 1. 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 MRI abdomen 12/20/2020-loss of continuity of the pancreatic duct in the mid pancreas body with mild upstream dilatation, no discrete lesion identified, left adrenal  adenoma, small cystic pancreas lesion-intraductal papillary mucinous tumor? ? 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 Normal CA 19-9 01/24/2021 ? CTs 01/29/2021-no pancreatic mass.  No pancreatic ductal dilatation identified.  No definite signs of metastatic disease in the chest, abdomen or pelvis. ? Cycle 1 gemcitabine/Abraxane 03/01/2021 ? Cycle 2 gemcitabine/Abraxane 03/14/2021 2. Diabetes 3. Coronary artery disease 4. Prostate cancer 2013-treated with radiation at [facility] 5. Hypertension 6. Sleep apnea 7.  Coronary artery bypass surgery 2004 8.  Hospital admission 03/04/2021-syncope, A. Fib-started on Eliquis 9.  Mild thrombocytopenia following cycle 1 gemcitabine/Abraxane-resolved     Disposition: [name] appears stable.  He has completed 1 treatment with gemcitabine/Abraxane.  Was admitted last week with a fever and syncope.  No source for infection was identified other than a possible cellulitis at the right arm.  The right arm erythema has resolved.  He was found to have new onset atrial fibrillation and is now maintained on apixaban.  He will complete cycle 2 gemcitabine/Abraxane today.  Betsy Coder, MD  03/14/2021  11:45 AM

## 2021-03-14 NOTE — Progress Notes (Signed)
Per Dr. Benay Spice it is ok to treat with Gemzar and Abraxane today today with AST 92 and ALT 112.

## 2021-03-14 NOTE — Patient Instructions (Signed)
[facility] Discharge Instructions for Patients Receiving Chemotherapy  Today you received the following chemotherapy agents Gemzar and Abraxane  To help prevent nausea and vomiting after your treatment, we encourage you to take your nausea medication as directed.   If you develop nausea and vomiting that is not controlled by your nausea medication, call the clinic.   BELOW ARE SYMPTOMS THAT SHOULD BE REPORTED IMMEDIATELY:  *FEVER GREATER THAN 100.5 F  *CHILLS WITH OR WITHOUT FEVER  NAUSEA AND VOMITING THAT IS NOT CONTROLLED WITH YOUR NAUSEA MEDICATION  *UNUSUAL SHORTNESS OF BREATH  *UNUSUAL BRUISING OR BLEEDING  TENDERNESS IN MOUTH AND THROAT WITH OR WITHOUT PRESENCE OF ULCERS  *URINARY PROBLEMS  *BOWEL PROBLEMS  UNUSUAL RASH Items with * indicate a potential emergency and should be followed up as soon as possible.  Feel free to call the clinic should you have any questions or concerns. The clinic phone number is [phone].  Please show the [facility] at check-in to the Emergency Department and triage nurse.

## 2021-03-20 ENCOUNTER — Telehealth: Payer: Self-pay | Admitting: *Deleted

## 2021-03-20 NOTE — Telephone Encounter (Signed)
Wife called to report that last night his RLE was swollen more than usual, warm to touch and very red from top of foot to pretibial area. Today it has improved, but he has been in recliner all day with leg elevated. No fever and no pain. Asking if he needs antibiotic again?  Next visit 03/28/21

## 2021-03-20 NOTE — Telephone Encounter (Signed)
Per Dr. Benay Spice: Could be related to Gemzar. Continue to elevate leg and call if worsens or does not get better before weekend. Does not need antibiotic. Has already had negative doppler on that leg. Wife notified.

## 2021-03-24 ENCOUNTER — Other Ambulatory Visit: Payer: Self-pay | Admitting: Oncology

## 2021-03-25 ENCOUNTER — Encounter: Payer: Self-pay | Admitting: Student

## 2021-03-25 ENCOUNTER — Ambulatory Visit (INDEPENDENT_AMBULATORY_CARE_PROVIDER_SITE_OTHER): Payer: Medicare Other | Admitting: Student

## 2021-03-25 ENCOUNTER — Other Ambulatory Visit: Payer: Self-pay

## 2021-03-25 VITALS — BP 154/60 | HR 95 | Ht 71.0 in | Wt 279.8 lb

## 2021-03-25 DIAGNOSIS — E785 Hyperlipidemia, unspecified: Secondary | ICD-10-CM

## 2021-03-25 DIAGNOSIS — I2510 Atherosclerotic heart disease of native coronary artery without angina pectoris: Secondary | ICD-10-CM | POA: Diagnosis not present

## 2021-03-25 DIAGNOSIS — I480 Paroxysmal atrial fibrillation: Secondary | ICD-10-CM | POA: Diagnosis not present

## 2021-03-25 DIAGNOSIS — E118 Type 2 diabetes mellitus with unspecified complications: Secondary | ICD-10-CM

## 2021-03-25 DIAGNOSIS — N183 Chronic kidney disease, stage 3 unspecified: Secondary | ICD-10-CM

## 2021-03-25 DIAGNOSIS — Z951 Presence of aortocoronary bypass graft: Secondary | ICD-10-CM

## 2021-03-25 DIAGNOSIS — Z794 Long term (current) use of insulin: Secondary | ICD-10-CM | POA: Diagnosis not present

## 2021-03-25 DIAGNOSIS — I951 Orthostatic hypotension: Secondary | ICD-10-CM | POA: Diagnosis not present

## 2021-03-25 DIAGNOSIS — C259 Malignant neoplasm of pancreas, unspecified: Secondary | ICD-10-CM

## 2021-03-25 NOTE — Patient Instructions (Signed)
Medication Instructions:  Continue current medications  *If you need a refill on your cardiac medications before your next appointment, please call your pharmacy*   Lab Work: None Ordered   Testing/Procedures: None Ordered   Follow-Up: At [facility], you and your health needs are our priority.  As part of our continuing mission to provide you with exceptional heart care, we have created designated Provider Care Teams.  These Care Teams include your primary Cardiologist (physician) and Advanced Practice Providers (APPs -  Physician Assistants and Nurse Practitioners) who all work together to provide you with the care you need, when you need it.  We recommend signing up for the patient portal called "MyChart".  Sign up information is provided on this After Visit Summary.  MyChart is used to connect with patients for Virtual Visits (Telemedicine).  Patients are able to view lab/test results, encounter notes, upcoming appointments, etc.  Non-urgent messages can be sent to your provider as well.   To learn more about what you can do with MyChart, go to [email or web address].    Your next appointment:   4-6 month(s)  The format for your next appointment:   In Person  Provider:   You may see Peter [name], MD or one of the following Advanced Practice Providers on your designated Care Team:    Almyra Deforest, PA-C  Fabian Sharp, PA-C or   Roby Lofts, [place]

## 2021-03-26 ENCOUNTER — Inpatient Hospital Stay: Payer: Medicare Other | Admitting: Nutrition

## 2021-03-26 ENCOUNTER — Inpatient Hospital Stay: Payer: Medicare Other | Attending: Nurse Practitioner | Admitting: Genetic Counselor

## 2021-03-26 ENCOUNTER — Inpatient Hospital Stay: Payer: Medicare Other

## 2021-03-26 DIAGNOSIS — Z8546 Personal history of malignant neoplasm of prostate: Secondary | ICD-10-CM | POA: Diagnosis not present

## 2021-03-26 DIAGNOSIS — Z803 Family history of malignant neoplasm of breast: Secondary | ICD-10-CM | POA: Diagnosis not present

## 2021-03-26 DIAGNOSIS — C250 Malignant neoplasm of head of pancreas: Secondary | ICD-10-CM

## 2021-03-26 DIAGNOSIS — Z5111 Encounter for antineoplastic chemotherapy: Secondary | ICD-10-CM | POA: Diagnosis not present

## 2021-03-26 DIAGNOSIS — C259 Malignant neoplasm of pancreas, unspecified: Secondary | ICD-10-CM | POA: Insufficient documentation

## 2021-03-26 LAB — CBC WITH DIFFERENTIAL (CANCER CENTER ONLY)
Abs Immature Granulocytes: 0.05 10*3/uL (ref 0.00–0.07)
Basophils Absolute: 0 10*3/uL (ref 0.0–0.1)
Basophils Relative: 0 %
Eosinophils Absolute: 0.1 10*3/uL (ref 0.0–0.5)
Eosinophils Relative: 2 %
HCT: 28.8 % — ABNORMAL LOW (ref 39.0–52.0)
Hemoglobin: 9.4 g/dL — ABNORMAL LOW (ref 13.0–17.0)
Immature Granulocytes: 1 %
Lymphocytes Relative: 18 %
Lymphs Abs: 0.7 10*3/uL (ref 0.7–4.0)
MCH: 31.3 pg (ref 26.0–34.0)
MCHC: 32.6 g/dL (ref 30.0–36.0)
MCV: 96 fL (ref 80.0–100.0)
Monocytes Absolute: 0.5 10*3/uL (ref 0.1–1.0)
Monocytes Relative: 13 %
Neutro Abs: 2.5 10*3/uL (ref 1.7–7.7)
Neutrophils Relative %: 66 %
Platelet Count: 158 10*3/uL (ref 150–400)
RBC: 3 MIL/uL — ABNORMAL LOW (ref 4.22–5.81)
RDW: 19.7 % — ABNORMAL HIGH (ref 11.5–15.5)
WBC Count: 3.8 10*3/uL — ABNORMAL LOW (ref 4.0–10.5)
nRBC: 0 % (ref 0.0–0.2)

## 2021-03-26 LAB — CMP (CANCER CENTER ONLY)
ALT: 41 U/L (ref 0–44)
AST: 39 U/L (ref 15–41)
Albumin: 3.4 g/dL — ABNORMAL LOW (ref 3.5–5.0)
Alkaline Phosphatase: 96 U/L (ref 38–126)
Anion gap: 9 (ref 5–15)
BUN: 15 mg/dL (ref 8–23)
CO2: 25 mmol/L (ref 22–32)
Calcium: 9.1 mg/dL (ref 8.9–10.3)
Chloride: 107 mmol/L (ref 98–111)
Creatinine: 1.37 mg/dL — ABNORMAL HIGH (ref 0.61–1.24)
GFR, Estimated: 53 mL/min — ABNORMAL LOW (ref >60)
Glucose, Bld: 203 mg/dL — ABNORMAL HIGH (ref 70–99)
Potassium: 4.7 mmol/L (ref 3.5–5.1)
Sodium: 141 mmol/L (ref 135–145)
Total Bilirubin: 1.1 mg/dL (ref 0.3–1.2)
Total Protein: 6.5 g/dL (ref 6.5–8.1)

## 2021-03-26 LAB — GENETIC SCREENING ORDER

## 2021-03-26 NOTE — Progress Notes (Signed)
75 year old male diagnosed with pancreas cancer.  He is receiving chemotherapy (gemcitabine and Abraxane ) and followed by Dr. Benay Spice.  Past medical history includes diabetes, CAD, prostate cancer with radiation therapy, hypertension, coronary artery bypass.  Medications include Compazine, Prozac, Lantus, multivitamin, Protonix, and prebiotic's.  Labs include glucose 203 and creatinine 1.37 on April 12. Height: 5 feet 11 inches. Weight: 279.8 pounds April 11. Usual body weight: 290 pounds. BMI: 39.02.  Patient reports his appetite is decreased but he is trying to eat the best he can.  His wife prepares foods for him.  He describes himself as a picky eater. Reports he is drinking between 3 and 4 bottles of water daily.  He is unsure if these are 16 ounces or 20 ounces each. He is using Benefiber twice daily for constipation and is having good bowel movements. Denies other nutrition impact symptoms.  Nutrition diagnosis:  Food and nutrition related knowledge deficit related to new diagnosis of pancreas cancer as evidenced by no prior need for nutrition related information.  Intervention: Patient educated to consume smaller more frequent meals and snacks with adequate calories and protein for weight maintenance.  Discouraged intentional weight loss during treatment. Reviewed high-calorie, high-protein foods.  Reviewed appropriate snacks. Continue increased fluids and bowel regimen. Provided nutrition fact sheets.  Questions were answered.  Teach back method used.  Contact information given.  Monitoring, evaluation, goals: Patient will tolerate increased calories and protein to minimize weight loss.  Next visit: To be scheduled as needed.  Patient agrees to contact RD with questions.  **Disclaimer: This note was dictated with voice recognition software. Similar sounding words can inadvertently be transcribed and this note may contain transcription errors which may not have been corrected  upon publication of note.**

## 2021-03-27 ENCOUNTER — Encounter: Payer: Self-pay | Admitting: Genetic Counselor

## 2021-03-27 DIAGNOSIS — Z803 Family history of malignant neoplasm of breast: Secondary | ICD-10-CM | POA: Insufficient documentation

## 2021-03-27 DIAGNOSIS — Z8546 Personal history of malignant neoplasm of prostate: Secondary | ICD-10-CM | POA: Insufficient documentation

## 2021-03-27 NOTE — Progress Notes (Signed)
REFERRING PROVIDER: Ladell Pier, MD [address] [place],  [place] [address]  PRIMARY PROVIDER:  [name], [name] G, MD  PRIMARY REASON FOR VISIT:  1. Cancer of head of pancreas ([place])   2. History of prostate cancer   3. Family history of breast cancer      HISTORY OF PRESENT ILLNESS:   [name], a 75 y.o. male, was seen for a [place] cancer genetics consultation at the request of Dr. Benay Spice due to a personal and family history of cancer.  [name] presents to clinic today to discuss the possibility of a hereditary predisposition to cancer, genetic testing, and to further clarify his future cancer risks, as well as potential cancer risks for family members.   In February of 2022, at the age of 59, [name] was diagnosed with pancreatic adenocarcinoma. The treatment plan includes chemotherapy.   [name] also has a history of prostate cancer (Gleason 4+4=8) diagnosed in 2013, treated at [facility] with radiation therapy.   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 Anemia   . Arthritis   . Cancer [facility])    prostate  . Chronic kidney disease    blood in urine   . Constipation   . Coronary artery disease   . Depression   . Diabetes mellitus without complication ([place])   . Difficult intubation    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 it was hard to get the tube down his throat  . Family history of breast cancer   . Fatty liver   . Frequent headaches   . GERD (gastroesophageal reflux disease)   . History of chicken pox   . History  of fainting spells of unknown cause   . History of prostate cancer   . Hyperlipidemia   . Hypertension   . Myocardial infarction ([place])   . Pneumonia   . Prostate cancer ([place])   . PTSD (post-traumatic stress disorder)   . Sleep apnea    uses Cpap    Past Surgical History:  Procedure Laterality Date  . APPENDECTOMY    . BIOPSY  01/15/2021   Procedure: BIOPSY;  Surgeon: Rush Landmark Telford Nab., MD;  Location: [place];  Service: Gastroenterology;;  . CARDIAC SURGERY     Triple Bypass  . CHOLECYSTECTOMY  2010  . CORONARY ARTERY BYPASS GRAFT  2004  . ESOPHAGOGASTRODUODENOSCOPY (EGD) WITH PROPOFOL N/A 01/15/2021   Procedure: ESOPHAGOGASTRODUODENOSCOPY (EGD) WITH PROPOFOL;  Surgeon: Rush Landmark Telford Nab., MD;  Location: [place];  Service: Gastroenterology;  Laterality: N/A;  . EUS N/A 01/15/2021   Procedure: UPPER ENDOSCOPIC ULTRASOUND (EUS) RADIAL;  Surgeon: Rush Landmark Telford Nab., MD;  Location: [place];  Service: Gastroenterology;  Laterality: N/A;  . FINE NEEDLE ASPIRATION  01/15/2021   Procedure: FINE NEEDLE ASPIRATION (FNA) LINEAR;  Surgeon: Irving Copas., MD;  Location: [place];  Service: Gastroenterology;;  . Sol Passer PLACEMENT Right 02/21/2021   Procedure: INSERTION PORT-A-CATH;  Surgeon: Dwan Bolt, MD;  Location: [place];  Service: General;  Laterality: Right;  . TONSILLECTOMY  1958    Social History   Socioeconomic History  . Marital status: Married    Spouse name: Not on file  . Number of children: 2  . Years of education: Not on file  . Highest education level: Not on file  Occupational History  . Occupation: retired  Tobacco Use  . Smoking status: Former Smoker    Quit date: 12/14/1975    Years since quitting: 45.3  . Smokeless tobacco: Never Used  Vaping Use  . Vaping Use: Not on file  Substance and Sexual Activity  . Alcohol use: Not Currently  . Drug use: Never  . Sexual activity: Not Currently  Other Topics Concern  . Not on  file  Social History Narrative   ** Merged History Encounter **       Social Determinants of Health   Financial Resource Strain: Not on file  Food Insecurity: Not on file  Transportation Needs: Not on file  Physical Activity: Not on file  Stress: Not on file  Social Connections: Not on file     FAMILY HISTORY:  We obtained a detailed, 4-generation family history.  Significant diagnoses are listed below: Family History  Problem Relation Age of Onset  . Arthritis Mother   . Heart disease Mother   . Hyperlipidemia Mother   . Hypertension Mother   . Heart attack Mother   . Breast cancer Sister        dx 45s  . Heart attack Brother   . Heart disease Brother   . Depression Daughter   . Arthritis Sister   . Cancer Sister        unknown type, dx 26s  . Cancer Brother   . Learning disabilities Brother   . Alcohol abuse Brother   . COPD Brother   . Heart attack Brother   . Heart disease Brother    [name] has one daughter (age 73) and one son (age 33). He had four sisters and three maternal half-brothers. One sister was diagnosed with breast cancer in her 48s. Another sister was diagnosed with cancer in her 21s (unknown type).  [name] mother died in her 25s without cancer. There were eight or nine maternal aunts and uncles. [name] has limited information about these relatives and their children. He does not have much information about his maternal grandparents.   [name] father died at age 71 or older without cancer. There were multiple paternal aunts and uncles (he is not sure exactly how many). He has limited information about these relatives or their children. [name] paternal grandparents died in their 22s or older without a known cancer.   [name] is unaware of previous family history of genetic testing for hereditary cancer risks. Patient's maternal and paternal ancestors are of unknown descen. There is no reported Ashkenazi Jewish  ancestry. There is no known consanguinity.  GENETIC COUNSELING ASSESSMENT: [name] is a 75 y.o. male with a personal history of prostate cancer and pancreatic cancer, as well as a family history of young-onset breast cancer, which is somewhat suggestive of a hereditary cancer syndrome and predisposition to cancer. We, therefore, discussed and recommended the following at today's visit.   DISCUSSION: We discussed that approximately 5-10% of cancer is hereditary, with most cases of hereditary pancreatic, prostate, and/or breast cancer associated with the BRCA1 and BRCA2 genes. There are other genes that can be associated with hereditary pancreatic, prostate, or breast cancer syndromes. These include  ATM, PALB2, CHEK2, the Lynch syndrome genes, CDKN2A, etc. We discussed that testing is beneficial for several reasons, including knowing about other cancer risks, identifying potential screening and risk-reduction options that may be appropriate, and to understand if other family members could be at risk for cancer and allow them to undergo genetic testing.   We reviewed the characteristics, features and inheritance patterns of hereditary cancer syndromes. We also discussed genetic testing, including the appropriate family members to test, the process of testing, insurance coverage and turn-around-time for results. We discussed the implications of a negative, positive and/or variant of uncertain significant result. We recommended [name] pursue genetic testing for the [facility] Common Hereditary Cancers panel and Pancreatic Cancer panel.   The Common Hereditary Cancers Panel offered by [facility] includes sequencing and/or deletion duplication testing of the following 47 genes: APC, ATM, AXIN2, BARD1, BMPR1A, BRCA1, BRCA2, BRIP1, CDH1, CDK4, CDKN2A (p14ARF), CDKN2A (p16INK4a), CHEK2, CTNNA1, DICER1, EPCAM (Deletion/duplication testing only), GREM1 (promoter region deletion/duplication testing only), KIT, MEN1,  MLH1, MSH2, MSH3, MSH6, MUTYH, NBN, NF1, NTHL1, PALB2, PDGFRA, PMS2, POLD1, POLE, PTEN, RAD50, RAD51C, RAD51D, SDHB, SDHC, SDHD, SMAD4, SMARCA4. STK11, TP53, TSC1, TSC2, and VHL.  The following genes were evaluated for sequence changes only: SDHA and HOXB13 c.251G>A variant only. The Pancreatic Cancer Panel offered by [facility] includes sequencing and deletion/duplication analysis of the following 29 genes: APC, ATM, BMPR1A, BRCA1, BRCA2, CASR, CDK4, CDKN2A, CFTR, CPA1, CTRC, EPCAM, FANCC, MEN1, MLH1, MSH2, MSH6, NF1, PALB2, PALLD, PMS2, PRSS1, SMAD4, SPINK1, STK11, TP53, TSC1, TSC2, and VHL.   Based on [name]'s personal and family history of cancer, he meets medical criteria for genetic testing. Despite that he meets criteria, there may still be an out of pocket cost. We discussed that if his out of pocket cost for testing is over $100, the laboratory will reach out to let him know. If the out of pocket cost of testing is less than $100 he will be billed by the genetic testing laboratory.   PLAN: After considering the risks, benefits, and limitations, [name] provided informed consent to pursue genetic testing and the blood sample was sent to [facility] for analysis of the Common Hereditary Cancers panel + Pancreatic Cancer panel. Results should be available within approximately two-three weeks' time, at which point they will be disclosed by telephone to [name], as will any additional recommendations warranted by these results. [name] will receive a summary of his genetic counseling visit and a copy of his results once available. This information will also be available in Epic.   [name] questions were answered to his satisfaction today. Our contact information was provided should additional questions or concerns arise. Thank you for the referral and allowing [place] to share in the care of your patient.   Clint Guy, [place], [facility] Licensed, Certified [email or web address] Phone: [phone]  The patient was seen for a total of 30 minutes in face-to-face genetic counseling.  This patient was discussed with Drs. Magrinat, Lindi Adie and/or Burr Medico who agrees with the above.    _______________________________________________________________________ For Office Staff:  Number of people involved in session: 1 Was an Intern/ student involved with case: no

## 2021-03-28 ENCOUNTER — Inpatient Hospital Stay: Payer: Medicare Other

## 2021-03-28 ENCOUNTER — Encounter: Payer: Self-pay | Admitting: Nurse Practitioner

## 2021-03-28 ENCOUNTER — Other Ambulatory Visit: Payer: Self-pay

## 2021-03-28 ENCOUNTER — Telehealth: Payer: Self-pay | Admitting: Oncology

## 2021-03-28 ENCOUNTER — Inpatient Hospital Stay (HOSPITAL_BASED_OUTPATIENT_CLINIC_OR_DEPARTMENT_OTHER): Payer: Medicare Other | Admitting: Nurse Practitioner

## 2021-03-28 VITALS — BP 151/77 | HR 99 | Temp 97.8°F | Resp 18 | Ht 71.0 in | Wt 274.8 lb

## 2021-03-28 DIAGNOSIS — Z5111 Encounter for antineoplastic chemotherapy: Secondary | ICD-10-CM | POA: Diagnosis not present

## 2021-03-28 DIAGNOSIS — C250 Malignant neoplasm of head of pancreas: Secondary | ICD-10-CM | POA: Diagnosis not present

## 2021-03-28 DIAGNOSIS — Z95828 Presence of other vascular implants and grafts: Secondary | ICD-10-CM

## 2021-03-28 DIAGNOSIS — I25810 Atherosclerosis of coronary artery bypass graft(s) without angina pectoris: Secondary | ICD-10-CM

## 2021-03-28 DIAGNOSIS — C259 Malignant neoplasm of pancreas, unspecified: Secondary | ICD-10-CM | POA: Diagnosis not present

## 2021-03-28 LAB — CBC WITH DIFFERENTIAL (CANCER CENTER ONLY)
Abs Immature Granulocytes: 0.04 10*3/uL (ref 0.00–0.07)
Basophils Absolute: 0 10*3/uL (ref 0.0–0.1)
Basophils Relative: 1 %
Eosinophils Absolute: 0.1 10*3/uL (ref 0.0–0.5)
Eosinophils Relative: 2 %
HCT: 28 % — ABNORMAL LOW (ref 39.0–52.0)
Hemoglobin: 9.2 g/dL — ABNORMAL LOW (ref 13.0–17.0)
Immature Granulocytes: 1 %
Lymphocytes Relative: 12 %
Lymphs Abs: 0.5 10*3/uL — ABNORMAL LOW (ref 0.7–4.0)
MCH: 31.4 pg (ref 26.0–34.0)
MCHC: 32.9 g/dL (ref 30.0–36.0)
MCV: 95.6 fL (ref 80.0–100.0)
Monocytes Absolute: 0.5 10*3/uL (ref 0.1–1.0)
Monocytes Relative: 12 %
Neutro Abs: 3.1 10*3/uL (ref 1.7–7.7)
Neutrophils Relative %: 72 %
Platelet Count: 191 10*3/uL (ref 150–400)
RBC: 2.93 MIL/uL — ABNORMAL LOW (ref 4.22–5.81)
RDW: 19.4 % — ABNORMAL HIGH (ref 11.5–15.5)
WBC Count: 4.2 10*3/uL (ref 4.0–10.5)
nRBC: 0.5 % — ABNORMAL HIGH (ref 0.0–0.2)

## 2021-03-28 LAB — CMP (CANCER CENTER ONLY)
ALT: 27 U/L (ref 0–44)
AST: 26 U/L (ref 15–41)
Albumin: 3.5 g/dL (ref 3.5–5.0)
Alkaline Phosphatase: 76 U/L (ref 38–126)
Anion gap: 7 (ref 5–15)
BUN: 18 mg/dL (ref 8–23)
CO2: 25 mmol/L (ref 22–32)
Calcium: 8.9 mg/dL (ref 8.9–10.3)
Chloride: 103 mmol/L (ref 98–111)
Creatinine: 1.32 mg/dL — ABNORMAL HIGH (ref 0.61–1.24)
GFR, Estimated: 56 mL/min — ABNORMAL LOW (ref >60)
Glucose, Bld: 232 mg/dL — ABNORMAL HIGH (ref 70–99)
Potassium: 3.6 mmol/L (ref 3.5–5.1)
Sodium: 135 mmol/L (ref 135–145)
Total Bilirubin: 1.1 mg/dL (ref 0.3–1.2)
Total Protein: 6.2 g/dL — ABNORMAL LOW (ref 6.5–8.1)

## 2021-03-28 MED ORDER — SODIUM CHLORIDE 0.9 % IV SOLN
1000.0000 mg/m2 | Freq: Once | INTRAVENOUS | Status: AC
  Administered 2021-03-28: 2546 mg via INTRAVENOUS
  Filled 2021-03-28: qty 52.6

## 2021-03-28 MED ORDER — HEPARIN SOD (PORK) LOCK FLUSH 100 UNIT/ML IV SOLN
500.0000 [IU] | Freq: Once | INTRAVENOUS | Status: AC | PRN
  Administered 2021-03-28: 500 [IU]
  Filled 2021-03-28: qty 5

## 2021-03-28 MED ORDER — SODIUM CHLORIDE 0.9 % IV SOLN
Freq: Once | INTRAVENOUS | Status: AC
Start: 2021-03-28 — End: 2021-03-28
  Filled 2021-03-28: qty 250

## 2021-03-28 MED ORDER — SODIUM CHLORIDE 0.9% FLUSH
10.0000 mL | INTRAVENOUS | Status: DC | PRN
  Administered 2021-03-28: 10 mL via INTRAVENOUS
  Filled 2021-03-28: qty 10

## 2021-03-28 MED ORDER — PROCHLORPERAZINE MALEATE 10 MG PO TABS
10.0000 mg | ORAL_TABLET | Freq: Once | ORAL | Status: AC
  Administered 2021-03-28: 10 mg via ORAL
  Filled 2021-03-28: qty 1

## 2021-03-28 MED ORDER — PACLITAXEL PROTEIN-BOUND CHEMO INJECTION 100 MG
100.0000 mg/m2 | Freq: Once | INTRAVENOUS | Status: AC
  Administered 2021-03-28: 250 mg via INTRAVENOUS
  Filled 2021-03-28: qty 50

## 2021-03-28 MED ORDER — SODIUM CHLORIDE 0.9% FLUSH
10.0000 mL | INTRAVENOUS | Status: DC | PRN
  Administered 2021-03-28: 10 mL
  Filled 2021-03-28: qty 10

## 2021-03-28 NOTE — Patient Instructions (Signed)
[place] Discharge Instructions for Patients Receiving Chemotherapy  Today you received the following chemotherapy agents Paclitaxel-protein bound (ABRAXANE) & Gemcitabine (GEMZAR).  To help prevent nausea and vomiting after your treatment, we encourage you to take your nausea medication as prescribed.   If you develop nausea and vomiting that is not controlled by your nausea medication, call the clinic.   BELOW ARE SYMPTOMS THAT SHOULD BE REPORTED IMMEDIATELY:  *FEVER GREATER THAN 100.5 F  *CHILLS WITH OR WITHOUT FEVER  NAUSEA AND VOMITING THAT IS NOT CONTROLLED WITH YOUR NAUSEA MEDICATION  *UNUSUAL SHORTNESS OF BREATH  *UNUSUAL BRUISING OR BLEEDING  TENDERNESS IN MOUTH AND THROAT WITH OR WITHOUT PRESENCE OF ULCERS  *URINARY PROBLEMS  *BOWEL PROBLEMS  UNUSUAL RASH Items with * indicate a potential emergency and should be followed up as soon as possible.  Feel free to call the clinic should you have any questions or concerns at The clinic phone number is [phone].  Please show the [place] at check-in to the Emergency Department and triage nurse.

## 2021-03-28 NOTE — Telephone Encounter (Signed)
Scheduled appt per 4/14 los - gave patient AVS and calender per los.

## 2021-03-28 NOTE — Progress Notes (Addendum)
  [place] OFFICE PROGRESS NOTE   Diagnosis: Pancreas cancer  INTERVAL HISTORY:   [name] returns as scheduled.  He completed cycle 2 gemcitabine/Abraxane 03/14/2021.  He denies nausea/vomiting.  No mouth sores.  No diarrhea.  No fever or rash after treatment.  He denies bleeding.  Last week when he was lifting a milk jug he heard a "popping" sound at the left upper arm.  Since then he has had pain/decreased range of motion at the left shoulder.  Objective:  Vital signs in last 24 hours:  Blood pressure (!) 151/77, pulse 99, temperature 97.8 F (36.6 C), temperature source Tympanic, resp. rate 18, height 5\' 11"  (1.803 m), weight 274 lb 12.8 oz (124.6 kg), SpO2 99 %.    HEENT: No thrush or ulcers. Resp: Distant breath sounds.  No respiratory distress. Cardio: Regular rate and rhythm. GI: Abdomen soft and nontender.  No hepatomegaly. Vascular: Trace edema at the lower leg bilaterally, right slightly greater than left.  Mild erythema right pretibial region. Musculoskeletal: Decreased range of motion at the left shoulder seems to be secondary to pain. Port-A-Cath without erythema.  Lab Results:  Lab Results  Component Value Date   WBC 4.2 03/28/2021   HGB 9.2 (L) 03/28/2021   HCT 28.0 (L) 03/28/2021   MCV 95.6 03/28/2021   PLT 191 03/28/2021   NEUTROABS 3.1 03/28/2021    Imaging:  No results found.  Medications: I have reviewed the patient's current medications.  Assessment/Plan: 1. 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 ? 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 Normal CA 19-9 01/24/2021 ? CTs 01/29/2021-no pancreatic mass.  No pancreatic ductal dilatation identified.  No definite signs of metastatic disease in the chest, abdomen or pelvis. ? Cycle 1 gemcitabine/Abraxane 03/01/2021 ? Cycle 2 gemcitabine/Abraxane 03/14/2021 ? Cycle 3 gemcitabine/Abraxane 03/28/2021 2. Diabetes 3. Coronary artery disease 4. Prostate cancer 2013-treated with radiation at [facility] 5. Hypertension 6. Sleep apnea 7.  Coronary artery bypass surgery 2004 8. Hospital admission 03/04/2021-syncope, A. Fib-started on Eliquis 9. Mild thrombocytopenia following cycle 1 gemcitabine/Abraxane-resolved   Disposition: [name] appears stable.  He has completed 2 cycles of gemcitabine/Abraxane.  Plan to proceed with cycle 3 today as scheduled.  We reviewed the overall plan as being approximately 3 months of chemotherapy prior to restaging.  He is in agreement.  We reviewed the CBC from today.  Counts adequate to proceed with treatment.  He will follow-up with his PCP regarding the left shoulder/arm pain.  He will return for lab, follow-up, cycle 4 gemcitabine/Abraxane in 2 weeks.  Patient seen with Dr. Benay Spice.    Ned Card ANP/GNP-BC   03/28/2021  10:57 AM This was a shared visit with Ned Card.  [name] was interviewed and examined.  He appears stable.  He will complete another treatment with gemcitabine/Abraxane today.  We will communicate with Dr. Zenia Resides after a restaging evaluation to decide on the indication for surgery.  I was present for greater than 50% of today's visit.  I performed medical decision making.  Julieanne Manson, MD

## 2021-04-04 ENCOUNTER — Telehealth: Payer: Self-pay | Admitting: Cardiology

## 2021-04-04 DIAGNOSIS — Z1379 Encounter for other screening for genetic and chromosomal anomalies: Secondary | ICD-10-CM | POA: Insufficient documentation

## 2021-04-04 NOTE — Telephone Encounter (Signed)
Patient's wife states he only got one pill left of apixaban (ELIQUIS) 5 MG TABS tablet. [facility] got approve with [facility] but the medication wont be here in time. Patient's wife would like to know if she can come by and pick a some samples. Please advise

## 2021-04-04 NOTE — Telephone Encounter (Signed)
Spoke with patients wife (Okay per [employer]), who states that patients Eliquis prescription has been approved through the [place] and his shipment of medications should come any day now but patient only has 1 day of Eliquis left.   Advised patients wife that 2 Boxes of 5mg  Eliquis has been left at the front desk for pick up.   Eliquis 5mg  LOT: [id number] EXP: 7/24  Advised patients wife to call back with any issues. Patients wife verbalized understanding.

## 2021-04-07 ENCOUNTER — Other Ambulatory Visit: Payer: Self-pay | Admitting: Oncology

## 2021-04-09 ENCOUNTER — Ambulatory Visit: Payer: Self-pay | Admitting: Genetic Counselor

## 2021-04-09 ENCOUNTER — Telehealth: Payer: Self-pay | Admitting: Genetic Counselor

## 2021-04-09 ENCOUNTER — Encounter: Payer: Self-pay | Admitting: Genetic Counselor

## 2021-04-09 DIAGNOSIS — Z1379 Encounter for other screening for genetic and chromosomal anomalies: Secondary | ICD-10-CM

## 2021-04-09 NOTE — Telephone Encounter (Signed)
Revealed negative genetic testing. Discussed that we do not know why he has had cancer or why there is cancer in the family. There could be a genetic mutation in the family that Mr. Abplanalp did not inherit. There could also be a mutation in a different gene that we are not testing, or our current technology may not be able to detect certain mutations. It will therefore be important for him to stay in contact with genetics to keep up with whether additional testing may be appropriate in the future.

## 2021-04-09 NOTE — Progress Notes (Signed)
HPI:  Mr. Manalang was previously seen in the [place] clinic due to a personal and family history of cancer and concerns regarding a hereditary predisposition to cancer. Please refer to our prior cancer genetics clinic note for more information regarding our discussion, assessment and recommendations, at the time. Mr. Inclan recent genetic test results were disclosed to him, as were recommendations warranted by these results. These results and recommendations are discussed in more detail below.  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)   01/31/2021 Cancer Staging   Staging form: Exocrine Pancreas, AJCC 8th Edition - Clinical: Stage IB (cT2, cN0, cM0) - Signed by Ladell Pier, MD on 01/31/2021 Total positive nodes: 0   03/01/2021 -  Chemotherapy    Patient is on Treatment Plan: PANCREATIC ABRAXANE / GEMCITABINE D1,8,15 Q28D      04/04/2021 Genetic Testing   Negative genetic testing:  No pathogenic variants detected on the [facility] Common Hereditary Cancers + Pancreatic Cancer Panel. The report date is 04/04/2021.   The Common Hereditary Cancers Panel offered by [facility] includes sequencing and/or deletion duplication testing of the following 47 genes: APC, ATM, AXIN2, BARD1, BMPR1A, BRCA1, BRCA2, BRIP1, CDH1, CDK4, CDKN2A (p14ARF), CDKN2A (p16INK4a), CHEK2, CTNNA1, DICER1, EPCAM (Deletion/duplication testing only), GREM1 (promoter region deletion/duplication testing only), KIT, MEN1, MLH1, MSH2, MSH3, MSH6, MUTYH, NBN, NF1, NTHL1, PALB2, PDGFRA, PMS2, POLD1, POLE, PTEN, RAD50, RAD51C, RAD51D, SDHB, SDHC, SDHD, SMAD4, SMARCA4. STK11, TP53, TSC1, TSC2, and VHL.  The following genes were evaluated for sequence changes only: SDHA and HOXB13 c.251G>A variant only. The Pancreatic Cancer Panel offered by [facility] includes sequencing and deletion/duplication analysis of the following 29 genes:  APC, ATM, BMPR1A, BRCA1, BRCA2, CASR, CDK4, CDKN2A, CFTR, CPA1, CTRC, EPCAM, FANCC, MEN1, MLH1, MSH2, MSH6, NF1, PALB2, PALLD, PMS2, PRSS1, SMAD4, SPINK1, STK11, TP53, TSC1, TSC2, and VHL.      FAMILY HISTORY:  We obtained a detailed, 4-generation family history.  Significant diagnoses are listed below: Family History  Problem Relation Age of Onset  . Arthritis Mother   . Heart disease Mother   . Hyperlipidemia Mother   . Hypertension Mother   . Heart attack Mother   . Breast cancer Sister        dx 10s  . Heart attack Brother   . Heart disease Brother   . Depression Daughter   . Arthritis Sister   . Cancer Sister        unknown type, dx 27s  . Cancer Brother   . Learning disabilities Brother   . Alcohol abuse Brother   . COPD Brother   . Heart attack Brother   . Heart disease Brother    Mr. Reither has one daughter (age 56) and one son (age 66). He had four sisters and three maternal half-brothers. One sister was diagnosed with breast cancer in her 75s. Another sister was diagnosed with cancer in her 13s (unknown type).  Mr. Pilger mother died in her 35s without cancer. There were eight or nine maternal aunts and uncles. Mr. Mcphillips has limited information about these relatives and their children. He does not have much information about his maternal grandparents.   Mr. Meldrum father died at age 40 or older without cancer. There were multiple paternal aunts and uncles (he is not sure exactly how many). He has limited information about these relatives or their children. Mr. Manthei paternal grandparents died in  their 56s or older without a known cancer.   Mr. Boydstun is unaware of previous family history of genetic testing for hereditary cancer risks. Patient's maternal and paternal ancestors are of unknown descen. There is no reported Ashkenazi Jewish ancestry. There is no known consanguinity.  GENETIC TEST RESULTS: Genetic testing reported out on 04/04/2021  through the [facility] Common Hereditary Cancers panel + Pancreatic Cancer panel. No pathogenic variants were detected.   The Common Hereditary Cancers Panel offered by [facility] includes sequencing and/or deletion duplication testing of the following 47 genes: APC, ATM, AXIN2, BARD1, BMPR1A, BRCA1, BRCA2, BRIP1, CDH1, CDK4, CDKN2A (p14ARF), CDKN2A (p16INK4a), CHEK2, CTNNA1, DICER1, EPCAM (Deletion/duplication testing only), GREM1 (promoter region deletion/duplication testing only), KIT, MEN1, MLH1, MSH2, MSH3, MSH6, MUTYH, NBN, NF1, NTHL1, PALB2, PDGFRA, PMS2, POLD1, POLE, PTEN, RAD50, RAD51C, RAD51D, SDHB, SDHC, SDHD, SMAD4, SMARCA4. STK11, TP53, TSC1, TSC2, and VHL. The following genes were evaluated for sequence changes only: SDHA and HOXB13 c.251G>A variant only. The Pancreatic Cancer Panel offered by [facility] includes sequencing and deletion/duplication analysis of the following 29 genes: APC, ATM, BMPR1A, BRCA1, BRCA2, CASR, CDK4, CDKN2A, CFTR, CPA1, CTRC, EPCAM, FANCC, MEN1, MLH1, MSH2, MSH6, NF1, PALB2, PALLD, PMS2, PRSS1, SMAD4, SPINK1, STK11, TP53, TSC1, TSC2, and VHL. The test report will be scanned into EPIC and located under the Molecular Pathology section of the Results Review tab.  A portion of the result report is included below for reference.     We discussed with Mr. Cerro that because current genetic testing is not perfect, it is possible there may be a gene mutation in one of these genes that current testing cannot detect, but that chance is small.  We also discussed that there could be another gene that has not yet been discovered, or that we have not yet tested, that is responsible for the cancer diagnoses in the family. It is also possible there is a hereditary cause for the cancer in the family that Mr. Villafuerte did not inherit and therefore was not identified in his testing.  Therefore, it is important to remain in touch with cancer genetics in the future so that we can continue to offer  Mr. Hanf the most up to date genetic testing.   CANCER SCREENING RECOMMENDATIONS: Mr. Rubenstein test result is considered negative (normal).  This means that we have not identified a hereditary cause for his personal and family history of cancer at this time. While reassuring, this does not definitively rule out a hereditary predisposition to cancer. It is still possible that there could be genetic mutations that are undetectable by current technology. There could be genetic mutations in genes that have not been tested or identified to increase cancer risk.  Therefore, it is recommended he continue to follow the cancer management and screening guidelines provided by his oncology and primary healthcare provider.   An individual's cancer risk and medical management are not determined by genetic test results alone. Overall cancer risk assessment incorporates additional factors, including personal medical history, family history, and any available genetic information that may result in a personalized plan for cancer prevention and surveillance.  RECOMMENDATIONS FOR FAMILY MEMBERS:  Individuals in this family might be at some increased risk of developing cancer, over the general population risk, simply due to the family history of cancer.  We recommended women in this family have a yearly mammogram beginning at age 58, or 50 years younger than the earliest onset of cancer, an annual clinical breast exam, and perform monthly breast self-exams. Women  in this family should also have a gynecological exam as recommended by their primary provider. All family members should be referred for colonoscopy starting at age 53.  It is also possible there is a hereditary cause for the cancer in Mr. Yearby's family that he did not inherit and therefore was not identified in him.  Based on Mr. Southard's family history, we recommended his sister, who was diagnosed with breast cancer in her 56s, have genetic counseling and  testing. Mr. Palmeri and his wife note that this sister has dementia. Therefore, it is reasonable for others in the family to consider genetic testing as well.   FOLLOW-UP: Lastly, we discussed with Mr. Deason that cancer genetics is a rapidly advancing field and it is possible that new genetic tests will be appropriate for him and/or his family members in the future. We encouraged him to remain in contact with cancer genetics on an annual basis so we can update his personal and family histories and let him know of advances in cancer genetics that may benefit this family.   Our contact number was provided. Mr. Wissing questions were answered to his satisfaction, and he knows he is welcome to call us at anytime with additional questions or concerns.   Clint Guy, MS, [facility] Genetic Counselor [email or web address] Phone: [phone]

## 2021-04-10 ENCOUNTER — Ambulatory Visit: Payer: Medicare Other | Admitting: Family Medicine

## 2021-04-11 ENCOUNTER — Inpatient Hospital Stay: Payer: Medicare Other

## 2021-04-11 ENCOUNTER — Inpatient Hospital Stay: Payer: Medicare Other | Admitting: Oncology

## 2021-04-11 NOTE — Progress Notes (Signed)
Patient's wife Glennis calls stating that the patient has been sick all week with a cold, denies fever but does a pretty bad cough.  He did an at home Covid test which was inconclusive.  Scheduled for treatment today, she is seeking advice.  She also has been sick with the same.   I consulted with Dr. Benay Spice and advised her that we would cancel his appointments for today and instructed her that patient and her should get a PCR Covid test done today.  I asked her to call us back with the results.  She verbalized an understanding.  She is aware that his treatment is being rescheduled.

## 2021-04-15 ENCOUNTER — Telehealth: Payer: Self-pay | Admitting: *Deleted

## 2021-04-15 ENCOUNTER — Telehealth: Payer: Self-pay | Admitting: Oncology

## 2021-04-15 NOTE — Telephone Encounter (Signed)
Called and spoke with wife regarding appointments added for 5/4.  She was ok with both date & time per 5/2 sch msg

## 2021-04-15 NOTE — Telephone Encounter (Signed)
Call to patient and spoke w/his wife: They both tested negative for COVID last week at [facility] and is back to his baseline. Has a persistent cough despite using an entire bottle of OTC cough medication. Are ready to get rescheduled for lab/flush/OV/Gemzar and Abraxane. Scheduling message sent and suggested to wife that he call PCP for the persistent cough.

## 2021-04-17 ENCOUNTER — Encounter: Payer: Self-pay | Admitting: Nurse Practitioner

## 2021-04-17 ENCOUNTER — Inpatient Hospital Stay: Payer: Medicare Other

## 2021-04-17 ENCOUNTER — Inpatient Hospital Stay: Payer: Medicare Other | Attending: Nurse Practitioner

## 2021-04-17 ENCOUNTER — Other Ambulatory Visit: Payer: Self-pay

## 2021-04-17 ENCOUNTER — Inpatient Hospital Stay (HOSPITAL_BASED_OUTPATIENT_CLINIC_OR_DEPARTMENT_OTHER): Payer: Medicare Other | Admitting: Nurse Practitioner

## 2021-04-17 VITALS — BP 135/61 | HR 89 | Temp 98.1°F | Resp 18 | Ht 71.0 in | Wt 268.0 lb

## 2021-04-17 DIAGNOSIS — C250 Malignant neoplasm of head of pancreas: Secondary | ICD-10-CM

## 2021-04-17 DIAGNOSIS — Z5111 Encounter for antineoplastic chemotherapy: Secondary | ICD-10-CM | POA: Insufficient documentation

## 2021-04-17 DIAGNOSIS — C259 Malignant neoplasm of pancreas, unspecified: Secondary | ICD-10-CM | POA: Insufficient documentation

## 2021-04-17 DIAGNOSIS — I25810 Atherosclerosis of coronary artery bypass graft(s) without angina pectoris: Secondary | ICD-10-CM | POA: Diagnosis not present

## 2021-04-17 LAB — CBC WITH DIFFERENTIAL (CANCER CENTER ONLY)
Abs Immature Granulocytes: 0.1 10*3/uL — ABNORMAL HIGH (ref 0.00–0.07)
Basophils Absolute: 0 10*3/uL (ref 0.0–0.1)
Basophils Relative: 0 %
Eosinophils Absolute: 0.1 10*3/uL (ref 0.0–0.5)
Eosinophils Relative: 1 %
HCT: 29.2 % — ABNORMAL LOW (ref 39.0–52.0)
Hemoglobin: 9.3 g/dL — ABNORMAL LOW (ref 13.0–17.0)
Immature Granulocytes: 2 %
Lymphocytes Relative: 9 %
Lymphs Abs: 0.6 10*3/uL — ABNORMAL LOW (ref 0.7–4.0)
MCH: 30.5 pg (ref 26.0–34.0)
MCHC: 31.8 g/dL (ref 30.0–36.0)
MCV: 95.7 fL (ref 80.0–100.0)
Monocytes Absolute: 0.5 10*3/uL (ref 0.1–1.0)
Monocytes Relative: 8 %
Neutro Abs: 5 10*3/uL (ref 1.7–7.7)
Neutrophils Relative %: 80 %
Platelet Count: 240 10*3/uL (ref 150–400)
RBC: 3.05 MIL/uL — ABNORMAL LOW (ref 4.22–5.81)
RDW: 18.6 % — ABNORMAL HIGH (ref 11.5–15.5)
WBC Count: 6.3 10*3/uL (ref 4.0–10.5)
nRBC: 0.3 % — ABNORMAL HIGH (ref 0.0–0.2)

## 2021-04-17 LAB — CMP (CANCER CENTER ONLY)
ALT: 17 U/L (ref 0–44)
AST: 24 U/L (ref 15–41)
Albumin: 3.5 g/dL (ref 3.5–5.0)
Alkaline Phosphatase: 78 U/L (ref 38–126)
Anion gap: 9 (ref 5–15)
BUN: 15 mg/dL (ref 8–23)
CO2: 25 mmol/L (ref 22–32)
Calcium: 8.8 mg/dL — ABNORMAL LOW (ref 8.9–10.3)
Chloride: 105 mmol/L (ref 98–111)
Creatinine: 1.25 mg/dL — ABNORMAL HIGH (ref 0.61–1.24)
GFR, Estimated: 60 mL/min — ABNORMAL LOW (ref >60)
Glucose, Bld: 167 mg/dL — ABNORMAL HIGH (ref 70–99)
Potassium: 3.4 mmol/L — ABNORMAL LOW (ref 3.5–5.1)
Sodium: 139 mmol/L (ref 135–145)
Total Bilirubin: 0.9 mg/dL (ref 0.3–1.2)
Total Protein: 6.2 g/dL — ABNORMAL LOW (ref 6.5–8.1)

## 2021-04-17 MED ORDER — PACLITAXEL PROTEIN-BOUND CHEMO INJECTION 100 MG
100.0000 mg/m2 | Freq: Once | INTRAVENOUS | Status: AC
  Administered 2021-04-17: 250 mg via INTRAVENOUS
  Filled 2021-04-17: qty 50

## 2021-04-17 MED ORDER — HEPARIN SOD (PORK) LOCK FLUSH 100 UNIT/ML IV SOLN
500.0000 [IU] | Freq: Once | INTRAVENOUS | Status: AC | PRN
  Administered 2021-04-17: 500 [IU]
  Filled 2021-04-17: qty 5

## 2021-04-17 MED ORDER — SODIUM CHLORIDE 0.9 % IV SOLN
1000.0000 mg/m2 | Freq: Once | INTRAVENOUS | Status: AC
  Administered 2021-04-17: 2546 mg via INTRAVENOUS
  Filled 2021-04-17: qty 52.6

## 2021-04-17 MED ORDER — PROCHLORPERAZINE MALEATE 10 MG PO TABS
10.0000 mg | ORAL_TABLET | Freq: Once | ORAL | Status: AC
Start: 2021-04-17 — End: 2021-04-17
  Administered 2021-04-17: 10 mg via ORAL

## 2021-04-17 MED ORDER — SODIUM CHLORIDE 0.9 % IV SOLN
Freq: Once | INTRAVENOUS | Status: AC
  Filled 2021-04-17: qty 250

## 2021-04-17 MED ORDER — SODIUM CHLORIDE 0.9% FLUSH
10.0000 mL | INTRAVENOUS | Status: DC | PRN
  Administered 2021-04-17: 10 mL
  Filled 2021-04-17: qty 10

## 2021-04-17 NOTE — Addendum Note (Signed)
Addended by: Betsy Coder B on: 04/17/2021 09:37 AM   Modules accepted: Orders

## 2021-04-17 NOTE — Patient Instructions (Signed)
[facility]    Discharge Instructions:  Thank you for choosing [facility] to provide your oncology and hematology care.   If you have a lab appointment with the [facility], please go directly to the [facility] and check in at the registration area.   Wear comfortable clothing and clothing appropriate for easy access to any Portacath or PICC line.   We strive to give you quality time with your provider. You may need to reschedule your appointment if you arrive late (15 or more minutes).  Arriving late affects you and other patients whose appointments are after yours.  Also, if you miss three or more appointments without notifying the office, you may be dismissed from the clinic at the provider's discretion.      For prescription refill requests, have your pharmacy contact our office and allow 72 hours for refills to be completed.    Today you received the following chemotherapy and/or immunotherapy agents Paclitaxel-protein bound (ABRAXANE) & Gemcitabine (GEMZAR).   To help prevent nausea and vomiting after your treatment, we encourage you to take your nausea medication as directed.  BELOW ARE SYMPTOMS THAT SHOULD BE REPORTED IMMEDIATELY: . *FEVER GREATER THAN 100.4 F (38 C) OR HIGHER . *CHILLS OR SWEATING . *NAUSEA AND VOMITING THAT IS NOT CONTROLLED WITH YOUR NAUSEA MEDICATION . *UNUSUAL SHORTNESS OF BREATH . *UNUSUAL BRUISING OR BLEEDING . *URINARY PROBLEMS (pain or burning when urinating, or frequent urination) . *BOWEL PROBLEMS (unusual diarrhea, constipation, pain near the anus) . TENDERNESS IN MOUTH AND THROAT WITH OR WITHOUT PRESENCE OF ULCERS (sore throat, sores in mouth, or a toothache) . UNUSUAL RASH, SWELLING OR PAIN  . UNUSUAL VAGINAL DISCHARGE OR ITCHING   Items with * indicate a potential emergency and should be followed up as soon as possible or go to the Emergency Department if any problems should occur.  Please show  the CHEMOTHERAPY ALERT CARD or IMMUNOTHERAPY ALERT CARD at check-in to the Emergency Department and triage nurse.  Should you have questions after your visit or need to cancel or reschedule your appointment, please contact [facility]  Dept: [phone]  and follow the prompts.  Office hours are 8:00 a.m. to 4:30 p.m. Monday - Friday. Please note that voicemails left after 4:00 p.m. may not be returned until the following business day.  We are closed weekends and major holidays. You have access to a nurse at all times for urgent questions. Please call the main number to the clinic Dept: [phone] and follow the prompts.   For any non-urgent questions, you may also contact your provider using MyChart. We now offer e-Visits for anyone 43 and older to request care online for non-urgent symptoms. For details visit [email or web address].   Also download the MyChart app! Go to the app store, search "MyChart", open the app, select [facility], and log in with your MyChart username and password.  Due to Covid, a mask is required upon entering the hospital/clinic. If you do not have a mask, one will be given to you upon arrival. For doctor visits, patients may have 1 support person aged 57 or older with them. For treatment visits, patients cannot have anyone with them due to current Covid guidelines and our immunocompromised population.

## 2021-04-17 NOTE — Progress Notes (Signed)
  [facility] OFFICE PROGRESS NOTE   Diagnosis:  Pancreas cancer  INTERVAL HISTORY:   [name] returns as scheduled. He completed cycle 3 Gemcitabine/Abraxane on 03/28/2021. His wife  contacted the office on 04/11/2021 to report he had cold symptoms/cough. She had similar symptoms. Treatment canceled on 04/11/2021.  Our office spoke with his wife on 04/15/2021.  Both tested negative for COVID.  Cycle 4 gemcitabine/Abraxane rescheduled to today.  He is feeling much better.  Specifically cough has improved.  He has stable mild dyspnea on exertion.  He had nausea on day 3 following the most recent chemotherapy, few episodes of vomiting.  He describes as mild.  He thinks he maybe took 1 dose of his home antiemetic with good relief.  No mouth sores.  No diarrhea.  Stable numbness/tingling in the feet which predated chemotherapy.  No fever or rash after chemotherapy.  Objective:  Vital signs in last 24 hours:  Blood pressure 135/61, pulse 89, temperature 98.1 F (36.7 C), temperature source Oral, resp. rate 18, height 5\' 11"  (1.803 m), weight 268 lb (121.6 kg), SpO2 98 %.    HEENT: No thrush or ulcers. Resp: Lungs clear bilaterally. Cardio: Regular rate and rhythm. GI: Abdomen soft and nontender.  No hepatomegaly. Vascular: Trace edema lower leg bilaterally, right slightly greater than left.  No significant erythema at the right lower leg or right arm. Port-A-Cath without erythema.  Lab Results:  Lab Results  Component Value Date   WBC 6.3 04/17/2021   HGB 9.3 (L) 04/17/2021   HCT 29.2 (L) 04/17/2021   MCV 95.7 04/17/2021   PLT 240 04/17/2021   NEUTROABS 5.0 04/17/2021    Imaging:  No results found.  Medications: I have reviewed the patient's current medications.  Assessment/Plan: 1. 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 ? 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 Normal CA 19-9 01/24/2021 ? CTs 01/29/2021-no pancreatic mass. No pancreatic ductal dilatation identified. No definite signs of metastatic disease in the chest, abdomen or pelvis. ? Cycle 1 gemcitabine/Abraxane 03/01/2021 ? Cycle 2 gemcitabine/Abraxane 03/14/2021 ? Cycle 3 gemcitabine/Abraxane 03/28/2021 ? Cycle 4 gemcitabine/Abraxane 04/17/2021 2. Diabetes 3. Coronary artery disease 4. Prostate cancer 2013-treated with radiation at [facility] 5. Hypertension 6. Sleep apnea 7.Coronary artery bypass surgery 2004 8. Hospital admission 03/04/2021-syncope, A. Fib-started on Eliquis 9. Mild thrombocytopeniafollowing cycle 1 gemcitabine/Abraxane-resolved  Disposition: [name] appears stable.  He has completed 3 cycles of gemcitabine/Abraxane.  Plan to proceed with cycle 4 today as scheduled.  We reviewed the CBC from today.  Counts adequate to proceed with treatment.  He will return for lab, follow-up, cycle 5 gemcitabine/Abraxane in 2 weeks.  He will contact the office in the interim with any problems.    Ned Card ANP/GNP-BC   04/17/2021  8:46 AM

## 2021-04-28 ENCOUNTER — Other Ambulatory Visit: Payer: Self-pay | Admitting: Oncology

## 2021-05-01 ENCOUNTER — Inpatient Hospital Stay (HOSPITAL_BASED_OUTPATIENT_CLINIC_OR_DEPARTMENT_OTHER): Payer: Medicare Other | Admitting: Oncology

## 2021-05-01 ENCOUNTER — Other Ambulatory Visit: Payer: Self-pay

## 2021-05-01 ENCOUNTER — Inpatient Hospital Stay: Payer: Medicare Other

## 2021-05-01 VITALS — BP 150/78 | HR 88 | Temp 98.1°F | Resp 20 | Ht 71.0 in | Wt 269.4 lb

## 2021-05-01 DIAGNOSIS — I25810 Atherosclerosis of coronary artery bypass graft(s) without angina pectoris: Secondary | ICD-10-CM | POA: Diagnosis not present

## 2021-05-01 DIAGNOSIS — C250 Malignant neoplasm of head of pancreas: Secondary | ICD-10-CM

## 2021-05-01 DIAGNOSIS — C259 Malignant neoplasm of pancreas, unspecified: Secondary | ICD-10-CM | POA: Diagnosis not present

## 2021-05-01 DIAGNOSIS — Z5111 Encounter for antineoplastic chemotherapy: Secondary | ICD-10-CM | POA: Diagnosis not present

## 2021-05-01 LAB — CBC WITH DIFFERENTIAL (CANCER CENTER ONLY)
Abs Immature Granulocytes: 0.01 K/uL (ref 0.00–0.07)
Basophils Absolute: 0 K/uL (ref 0.0–0.1)
Basophils Relative: 1 %
Eosinophils Absolute: 0.1 K/uL (ref 0.0–0.5)
Eosinophils Relative: 2 %
HCT: 28.8 % — ABNORMAL LOW (ref 39.0–52.0)
Hemoglobin: 9.3 g/dL — ABNORMAL LOW (ref 13.0–17.0)
Immature Granulocytes: 0 %
Lymphocytes Relative: 13 %
Lymphs Abs: 0.5 K/uL — ABNORMAL LOW (ref 0.7–4.0)
MCH: 31.1 pg (ref 26.0–34.0)
MCHC: 32.3 g/dL (ref 30.0–36.0)
MCV: 96.3 fL (ref 80.0–100.0)
Monocytes Absolute: 0.5 K/uL (ref 0.1–1.0)
Monocytes Relative: 14 %
Neutro Abs: 2.4 K/uL (ref 1.7–7.7)
Neutrophils Relative %: 70 %
Platelet Count: 204 K/uL (ref 150–400)
RBC: 2.99 MIL/uL — ABNORMAL LOW (ref 4.22–5.81)
RDW: 18.5 % — ABNORMAL HIGH (ref 11.5–15.5)
WBC Count: 3.5 K/uL — ABNORMAL LOW (ref 4.0–10.5)
nRBC: 0 % (ref 0.0–0.2)

## 2021-05-01 LAB — CMP (CANCER CENTER ONLY)
ALT: 22 U/L (ref 0–44)
AST: 25 U/L (ref 15–41)
Albumin: 3.5 g/dL (ref 3.5–5.0)
Alkaline Phosphatase: 81 U/L (ref 38–126)
Anion gap: 9 (ref 5–15)
BUN: 19 mg/dL (ref 8–23)
CO2: 25 mmol/L (ref 22–32)
Calcium: 8.5 mg/dL — ABNORMAL LOW (ref 8.9–10.3)
Chloride: 105 mmol/L (ref 98–111)
Creatinine: 1.2 mg/dL (ref 0.61–1.24)
GFR, Estimated: >60 mL/min (ref >60)
Glucose, Bld: 142 mg/dL — ABNORMAL HIGH (ref 70–99)
Potassium: 3.7 mmol/L (ref 3.5–5.1)
Sodium: 139 mmol/L (ref 135–145)
Total Bilirubin: 1.2 mg/dL (ref 0.3–1.2)
Total Protein: 6.1 g/dL — ABNORMAL LOW (ref 6.5–8.1)

## 2021-05-01 MED ORDER — SODIUM CHLORIDE 0.9% FLUSH
10.0000 mL | INTRAVENOUS | Status: DC | PRN
  Administered 2021-05-01: 10 mL
  Filled 2021-05-01: qty 10

## 2021-05-01 MED ORDER — SODIUM CHLORIDE 0.9 % IV SOLN
Freq: Once | INTRAVENOUS | Status: AC
  Filled 2021-05-01: qty 250

## 2021-05-01 MED ORDER — PACLITAXEL PROTEIN-BOUND CHEMO INJECTION 100 MG
100.0000 mg/m2 | Freq: Once | INTRAVENOUS | Status: AC
  Administered 2021-05-01: 250 mg via INTRAVENOUS
  Filled 2021-05-01: qty 50

## 2021-05-01 MED ORDER — SODIUM CHLORIDE 0.9 % IV SOLN: 5.0000 mg | Freq: Once | INTRAVENOUS | Status: DC

## 2021-05-01 MED ORDER — SODIUM CHLORIDE 0.9 % IV SOLN
1000.0000 mg/m2 | Freq: Once | INTRAVENOUS | Status: AC
  Administered 2021-05-01: 2546 mg via INTRAVENOUS
  Filled 2021-05-01: qty 26.3

## 2021-05-01 MED ORDER — HEPARIN SOD (PORK) LOCK FLUSH 100 UNIT/ML IV SOLN
500.0000 [IU] | Freq: Once | INTRAVENOUS | Status: AC | PRN
Start: 2021-05-01 — End: 2021-05-01
  Administered 2021-05-01: 500 [IU]
  Filled 2021-05-01: qty 5

## 2021-05-01 MED ORDER — ONDANSETRON HCL 8 MG PO TABS
8.0000 mg | ORAL_TABLET | Freq: Once | ORAL | Status: AC
Start: 2021-05-01 — End: 2021-05-01
  Administered 2021-05-01: 8 mg via ORAL
  Filled 2021-05-01: qty 1

## 2021-05-01 MED ORDER — DEXAMETHASONE SODIUM PHOSPHATE 10 MG/ML IJ SOLN
5.0000 mg | Freq: Once | INTRAMUSCULAR | Status: AC
  Administered 2021-05-01: 5 mg via INTRAVENOUS
  Filled 2021-05-01: qty 1

## 2021-05-01 NOTE — Patient Instructions (Signed)
[name]   Discharge Instructions: Thank you for choosing [facility] to provide your oncology and hematology care.   If you have a lab appointment with the [facility], please go directly to the [facility] and check in at the registration area.   Wear comfortable clothing and clothing appropriate for easy access to any Portacath or PICC line.   We strive to give you quality time with your provider. You may need to reschedule your appointment if you arrive late (15 or more minutes).  Arriving late affects you and other patients whose appointments are after yours.  Also, if you miss three or more appointments without notifying the office, you may be dismissed from the clinic at the provider's discretion.      For prescription refill requests, have your pharmacy contact our office and allow 72 hours for refills to be completed.    Today you received the following chemotherapy and/or immunotherapy agents: paaclitaxel-protein bound, gemcitabine    To help prevent nausea and vomiting after your treatment, we encourage you to take your nausea medication as directed.  BELOW ARE SYMPTOMS THAT SHOULD BE REPORTED IMMEDIATELY: . *FEVER GREATER THAN 100.4 F (38 C) OR HIGHER . *CHILLS OR SWEATING . *NAUSEA AND VOMITING THAT IS NOT CONTROLLED WITH YOUR NAUSEA MEDICATION . *UNUSUAL SHORTNESS OF BREATH . *UNUSUAL BRUISING OR BLEEDING . *URINARY PROBLEMS (pain or burning when urinating, or frequent urination) . *BOWEL PROBLEMS (unusual diarrhea, constipation, pain near the anus) . TENDERNESS IN MOUTH AND THROAT WITH OR WITHOUT PRESENCE OF ULCERS (sore throat, sores in mouth, or a toothache) . UNUSUAL RASH, SWELLING OR PAIN  . UNUSUAL VAGINAL DISCHARGE OR ITCHING   Items with * indicate a potential emergency and should be followed up as soon as possible or go to the Emergency Department if any problems should occur.  Please show the CHEMOTHERAPY ALERT  CARD or IMMUNOTHERAPY ALERT CARD at check-in to the Emergency Department and triage nurse.  Should you have questions after your visit or need to cancel or reschedule your appointment, please contact [facility]  Dept: [phone]  and follow the prompts.  Office hours are 8:00 a.m. to 4:30 p.m. Monday - Friday. Please note that voicemails left after 4:00 p.m. may not be returned until the following business day.  We are closed weekends and major holidays. You have access to a nurse at all times for urgent questions. Please call the main number to the clinic Dept: [phone] and follow the prompts.   For any non-urgent questions, you may also contact your provider using MyChart. We now offer e-Visits for anyone 58 and older to request care online for non-urgent symptoms. For details visit [email or web address].   Also download the MyChart app! Go to the app store, search "MyChart", open the app, select [facility], and log in with your MyChart username and password.  Due to Covid, a mask is required upon entering the hospital/clinic. If you do not have a mask, one will be given to you upon arrival. For doctor visits, patients may have 1 support person aged 75 or older with them. For treatment visits, patients cannot have anyone with them due to current Covid guidelines and our immunocompromised population.    Nanoparticle Albumin-Bound Paclitaxel injection What is this medicine? NANOPARTICLE ALBUMIN-BOUND PACLITAXEL (Na no PAHR ti kuhl al BYOO muhn-bound PAK li TAX el) is a chemotherapy drug. It targets fast dividing cells, like cancer cells, and causes  these cells to die. This medicine is used to treat advanced breast cancer, lung cancer, and pancreatic cancer. This medicine may be used for other purposes; ask your health care provider or pharmacist if you have questions. COMMON BRAND NAME(S): Abraxane What should I tell my health care provider before I take this  medicine? They need to know if you have any of these conditions:  kidney disease  liver disease  low blood counts, like low white cell, platelet, or red cell counts  lung or breathing disease, like asthma  tingling of the fingers or toes, or other nerve disorder  an unusual or allergic reaction to paclitaxel, albumin, other chemotherapy, other medicines, foods, dyes, or preservatives  pregnant or trying to get pregnant  breast-feeding How should I use this medicine? This drug is given as an infusion into a vein. It is administered in a hospital or clinic by a specially trained health care professional. Talk to your pediatrician regarding the use of this medicine in children. Special care may be needed. Overdosage: If you think you have taken too much of this medicine contact a poison control center or emergency room at once. NOTE: This medicine is only for you. Do not share this medicine with others. What if I miss a dose? It is important not to miss your dose. Call your doctor or health care professional if you are unable to keep an appointment. What may interact with this medicine? This medicine may interact with the following medications:  antiviral medicines for hepatitis, HIV or AIDS  certain antibiotics like erythromycin and clarithromycin  certain medicines for fungal infections like ketoconazole and itraconazole  certain medicines for seizures like carbamazepine, phenobarbital, phenytoin  gemfibrozil  nefazodone  rifampin  St. John's wort This list may not describe all possible interactions. Give your health care provider a list of all the medicines, herbs, non-prescription drugs, or dietary supplements you use. Also tell them if you smoke, drink alcohol, or use illegal drugs. Some items may interact with your medicine. What should I watch for while using this medicine? Your condition will be monitored carefully while you are receiving this medicine. You will need  important blood work done while you are taking this medicine. This medicine can cause serious allergic reactions. If you experience allergic reactions like skin rash, itching or hives, swelling of the face, lips, or tongue, tell your doctor or health care professional right away. In some cases, you may be given additional medicines to help with side effects. Follow all directions for their use. This drug may make you feel generally unwell. This is not uncommon, as chemotherapy can affect healthy cells as well as cancer cells. Report any side effects. Continue your course of treatment even though you feel ill unless your doctor tells you to stop. Call your doctor or health care professional for advice if you get a fever, chills or sore throat, or other symptoms of a cold or flu. Do not treat yourself. This drug decreases your body's ability to fight infections. Try to avoid being around people who are sick. This medicine may increase your risk to bruise or bleed. Call your doctor or health care professional if you notice any unusual bleeding. Be careful brushing and flossing your teeth or using a toothpick because you may get an infection or bleed more easily. If you have any dental work done, tell your dentist you are receiving this medicine. Avoid taking products that contain aspirin, acetaminophen, ibuprofen, naproxen, or ketoprofen unless instructed by  your doctor. These medicines may hide a fever. Do not become pregnant while taking this medicine or for 6 months after stopping it. Women should inform their doctor if they wish to become pregnant or think they might be pregnant. Men should not father a child while taking this medicine or for 3 months after stopping it. There is a potential for serious side effects to an unborn child. Talk to your health care professional or pharmacist for more information. Do not breast-feed an infant while taking this medicine or for 2 weeks after stopping it. This  medicine may interfere with the ability to get pregnant or to father a child. You should talk to your doctor or health care professional if you are concerned about your fertility. What side effects may I notice from receiving this medicine? Side effects that you should report to your doctor or health care professional as soon as possible:  allergic reactions like skin rash, itching or hives, swelling of the face, lips, or tongue  breathing problems  changes in vision  fast, irregular heartbeat  low blood pressure  mouth sores  pain, tingling, numbness in the hands or feet  signs of decreased platelets or bleeding - bruising, pinpoint red spots on the skin, black, tarry stools, blood in the urine  signs of decreased red blood cells - unusually weak or tired, feeling faint or lightheaded, falls  signs of infection - fever or chills, cough, sore throat, pain or difficulty passing urine  signs and symptoms of liver injury like dark yellow or brown urine; general ill feeling or flu-like symptoms; light-colored stools; loss of appetite; nausea; right upper belly pain; unusually weak or tired; yellowing of the eyes or skin  swelling of the ankles, feet, hands  unusually slow heartbeat Side effects that usually do not require medical attention (report to your doctor or health care professional if they continue or are bothersome):  diarrhea  hair loss  loss of appetite  nausea, vomiting  tiredness This list may not describe all possible side effects. Call your doctor for medical advice about side effects. You may report side effects to FDA at 1-800-FDA-1088. Where should I keep my medicine? This drug is given in a hospital or clinic and will not be stored at home. NOTE: This sheet is a summary. It may not cover all possible information. If you have questions about this medicine, talk to your doctor, pharmacist, or health care provider.  2021 Elsevier/Gold Standard (2017-08-04  13:03:45)   Gemcitabine injection What is this medicine? GEMCITABINE (jem SYE ta been) is a chemotherapy drug. This medicine is used to treat many types of cancer like breast cancer, lung cancer, pancreatic cancer, and ovarian cancer. This medicine may be used for other purposes; ask your health care provider or pharmacist if you have questions. COMMON BRAND NAME(S): Gemzar, Infugem What should I tell my health care provider before I take this medicine? They need to know if you have any of these conditions:  blood disorders  infection  kidney disease  liver disease  lung or breathing disease, like asthma  recent or ongoing radiation therapy  an unusual or allergic reaction to gemcitabine, other chemotherapy, other medicines, foods, dyes, or preservatives  pregnant or trying to get pregnant  breast-feeding How should I use this medicine? This drug is given as an infusion into a vein. It is administered in a hospital or clinic by a specially trained health care professional. Talk to your pediatrician regarding the use of this  medicine in children. Special care may be needed. Overdosage: If you think you have taken too much of this medicine contact a poison control center or emergency room at once. NOTE: This medicine is only for you. Do not share this medicine with others. What if I miss a dose? It is important not to miss your dose. Call your doctor or health care professional if you are unable to keep an appointment. What may interact with this medicine?  medicines to increase blood counts like filgrastim, pegfilgrastim, sargramostim  some other chemotherapy drugs like cisplatin  vaccines Talk to your doctor or health care professional before taking any of these medicines:  acetaminophen  aspirin  ibuprofen  ketoprofen  naproxen This list may not describe all possible interactions. Give your health care provider a list of all the medicines, herbs, non-prescription  drugs, or dietary supplements you use. Also tell them if you smoke, drink alcohol, or use illegal drugs. Some items may interact with your medicine. What should I watch for while using this medicine? Visit your doctor for checks on your progress. This drug may make you feel generally unwell. This is not uncommon, as chemotherapy can affect healthy cells as well as cancer cells. Report any side effects. Continue your course of treatment even though you feel ill unless your doctor tells you to stop. In some cases, you may be given additional medicines to help with side effects. Follow all directions for their use. Call your doctor or health care professional for advice if you get a fever, chills or sore throat, or other symptoms of a cold or flu. Do not treat yourself. This drug decreases your body's ability to fight infections. Try to avoid being around people who are sick. This medicine may increase your risk to bruise or bleed. Call your doctor or health care professional if you notice any unusual bleeding. Be careful brushing and flossing your teeth or using a toothpick because you may get an infection or bleed more easily. If you have any dental work done, tell your dentist you are receiving this medicine. Avoid taking products that contain aspirin, acetaminophen, ibuprofen, naproxen, or ketoprofen unless instructed by your doctor. These medicines may hide a fever. Do not become pregnant while taking this medicine or for 6 months after stopping it. Women should inform their doctor if they wish to become pregnant or think they might be pregnant. Men should not father a child while taking this medicine and for 3 months after stopping it. There is a potential for serious side effects to an unborn child. Talk to your health care professional or pharmacist for more information. Do not breast-feed an infant while taking this medicine or for at least 1 week after stopping it. Men should inform their doctors if  they wish to father a child. This medicine may lower sperm counts. Talk with your doctor or health care professional if you are concerned about your fertility. What side effects may I notice from receiving this medicine? Side effects that you should report to your doctor or health care professional as soon as possible:  allergic reactions like skin rash, itching or hives, swelling of the face, lips, or tongue  breathing problems  pain, redness, or irritation at site where injected  signs and symptoms of a dangerous change in heartbeat or heart rhythm like chest pain; dizziness; fast or irregular heartbeat; palpitations; feeling faint or lightheaded, falls; breathing problems  signs of decreased platelets or bleeding - bruising, pinpoint red spots on  the skin, black, tarry stools, blood in the urine  signs of decreased red blood cells - unusually weak or tired, feeling faint or lightheaded, falls  signs of infection - fever or chills, cough, sore throat, pain or difficulty passing urine  signs and symptoms of kidney injury like trouble passing urine or change in the amount of urine  signs and symptoms of liver injury like dark yellow or brown urine; general ill feeling or flu-like symptoms; light-colored stools; loss of appetite; nausea; right upper belly pain; unusually weak or tired; yellowing of the eyes or skin  swelling of ankles, feet, hands Side effects that usually do not require medical attention (report to your doctor or health care professional if they continue or are bothersome):  constipation  diarrhea  hair loss  loss of appetite  nausea  rash  vomiting This list may not describe all possible side effects. Call your doctor for medical advice about side effects. You may report side effects to FDA at 1-800-FDA-1088. Where should I keep my medicine? This drug is given in a hospital or clinic and will not be stored at home. NOTE: This sheet is a summary. It may not  cover all possible information. If you have questions about this medicine, talk to your doctor, pharmacist, or health care provider.  2021 Elsevier/Gold Standard (2018-02-24 18:06:11)

## 2021-05-01 NOTE — Progress Notes (Signed)
[facility] OFFICE PROGRESS NOTE   Diagnosis: Pancreas cancer  INTERVAL HISTORY:   [name] completed another cycle of gemcitabine/Abraxane on 04/17/2021.  He had nausea/vomiting and a fever each evening for 4 days beginning on day 2.  He did not take Compazine.  No associated symptoms.  No recurrent rash over the arm.  His blood sugar has been running at less than 130.  No neuropathy symptoms other than his right foot feels "cold "at night.  Objective:  Vital signs in last 24 hours:  Blood pressure (!) 150/78, pulse 88, temperature 98.1 F (36.7 C), temperature source Oral, resp. rate 20, height 5\' 11"  (1.803 m), weight 269 lb 6.4 oz (122.2 kg), SpO2 97 %.    HEENT: No thrush Resp: Lungs clear bilaterally  Cardio: Regular rate and rhythm GI: No hepatosplenomegaly, no mass, nontender Vascular: Trace edema with stasis change at the right leg below the knee  Skin: No rash over the trunk or right arm, fading yeast rash in the groin bilaterally  Portacath/PICC-without erythema  Lab Results:  Lab Results  Component Value Date   WBC 3.5 (L) 05/01/2021   HGB 9.3 (L) 05/01/2021   HCT 28.8 (L) 05/01/2021   MCV 96.3 05/01/2021   PLT 204 05/01/2021   NEUTROABS 2.4 05/01/2021    CMP  Lab Results  Component Value Date   NA 139 05/01/2021   K 3.7 05/01/2021   CL 105 05/01/2021   CO2 25 05/01/2021   GLUCOSE 142 (H) 05/01/2021   BUN 19 05/01/2021   CREATININE 1.20 05/01/2021   CALCIUM 8.5 (L) 05/01/2021   PROT 6.1 (L) 05/01/2021   ALBUMIN 3.5 05/01/2021   AST 25 05/01/2021   ALT 22 05/01/2021   ALKPHOS 81 05/01/2021   BILITOT 1.2 05/01/2021   GFRNONAA >60 05/01/2021   GFRAA 49 (L) 10/10/2020     Medications: I have reviewed the patient's current medications.   Assessment/Plan: 1. 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 ? 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 Normal CA 19-9 01/24/2021 ? CTs 01/29/2021-no pancreatic mass. No pancreatic ductal dilatation identified. No definite signs of metastatic disease in the chest, abdomen or pelvis. ? Cycle 1 gemcitabine/Abraxane 03/01/2021 ? Cycle 2 gemcitabine/Abraxane 03/14/2021 ? Cycle 3 gemcitabine/Abraxane 03/28/2021 ? Cycle 4 gemcitabine/Abraxane 04/17/2021 ? Cycle 5 gemcitabine/Abraxane 05/01/2021, Zofran/Decadron added 2. Diabetes 3. Coronary artery disease 4. Prostate cancer 2013-treated with radiation at [facility] 5. Hypertension 6. Sleep apnea 7.Coronary artery bypass surgery 2004 8. Hospital admission 03/04/2021-syncope, A. Fib-started on Eliquis 9. Mild thrombocytopeniafollowing cycle 1 gemcitabine/Abraxane-resolved    Disposition: [name] has completed 4 cycles of gemcitabine/Abraxane.  The most recent cycle of chemotherapy was complicated by a fever and nausea/vomiting.  It is unusual to have this degree of nausea with the gemcitabine/Abraxane regimen, but there was no other apparent etiology for his symptoms.  We added Zofran/Decadron to the antiemetic regimen.  He will use Compazine as needed.  He will call us for nausea following this cycle.  He will complete cycle 5 gemcitabine/Abraxane today.  He will return for an office visit  and chemotherapy in 2 weeks.  He will be referred for a restaging CT evaluation after cycle 6.    Betsy Coder, MD  05/01/2021  8:56 AM

## 2021-05-13 ENCOUNTER — Other Ambulatory Visit: Payer: Self-pay | Admitting: Oncology

## 2021-05-14 ENCOUNTER — Ambulatory Visit: Payer: Medicare Other | Admitting: Family Medicine

## 2021-05-15 ENCOUNTER — Other Ambulatory Visit: Payer: Self-pay | Admitting: Oncology

## 2021-05-15 ENCOUNTER — Inpatient Hospital Stay: Payer: Medicare Other

## 2021-05-15 ENCOUNTER — Inpatient Hospital Stay (HOSPITAL_BASED_OUTPATIENT_CLINIC_OR_DEPARTMENT_OTHER): Payer: Medicare Other | Admitting: Oncology

## 2021-05-15 ENCOUNTER — Other Ambulatory Visit: Payer: Self-pay

## 2021-05-15 ENCOUNTER — Inpatient Hospital Stay: Payer: Medicare Other | Attending: Nurse Practitioner

## 2021-05-15 ENCOUNTER — Encounter: Payer: Self-pay | Admitting: Oncology

## 2021-05-15 VITALS — BP 138/64 | HR 95 | Temp 98.7°F | Resp 24 | Wt 269.4 lb

## 2021-05-15 DIAGNOSIS — C250 Malignant neoplasm of head of pancreas: Secondary | ICD-10-CM | POA: Diagnosis not present

## 2021-05-15 DIAGNOSIS — Z7901 Long term (current) use of anticoagulants: Secondary | ICD-10-CM | POA: Diagnosis not present

## 2021-05-15 DIAGNOSIS — Z8546 Personal history of malignant neoplasm of prostate: Secondary | ICD-10-CM | POA: Insufficient documentation

## 2021-05-15 DIAGNOSIS — I10 Essential (primary) hypertension: Secondary | ICD-10-CM | POA: Insufficient documentation

## 2021-05-15 DIAGNOSIS — C259 Malignant neoplasm of pancreas, unspecified: Secondary | ICD-10-CM | POA: Insufficient documentation

## 2021-05-15 DIAGNOSIS — G4730 Sleep apnea, unspecified: Secondary | ICD-10-CM | POA: Insufficient documentation

## 2021-05-15 DIAGNOSIS — I4891 Unspecified atrial fibrillation: Secondary | ICD-10-CM | POA: Insufficient documentation

## 2021-05-15 DIAGNOSIS — E119 Type 2 diabetes mellitus without complications: Secondary | ICD-10-CM | POA: Insufficient documentation

## 2021-05-15 DIAGNOSIS — Z79899 Other long term (current) drug therapy: Secondary | ICD-10-CM | POA: Insufficient documentation

## 2021-05-15 DIAGNOSIS — I2510 Atherosclerotic heart disease of native coronary artery without angina pectoris: Secondary | ICD-10-CM | POA: Insufficient documentation

## 2021-05-15 DIAGNOSIS — Z5111 Encounter for antineoplastic chemotherapy: Secondary | ICD-10-CM | POA: Insufficient documentation

## 2021-05-15 LAB — CBC WITH DIFFERENTIAL (CANCER CENTER ONLY)
Abs Immature Granulocytes: 0.01 10*3/uL (ref 0.00–0.07)
Basophils Absolute: 0 10*3/uL (ref 0.0–0.1)
Basophils Relative: 0 %
Eosinophils Absolute: 0.1 10*3/uL (ref 0.0–0.5)
Eosinophils Relative: 3 %
HCT: 27.8 % — ABNORMAL LOW (ref 39.0–52.0)
Hemoglobin: 8.9 g/dL — ABNORMAL LOW (ref 13.0–17.0)
Immature Granulocytes: 0 %
Lymphocytes Relative: 12 %
Lymphs Abs: 0.4 10*3/uL — ABNORMAL LOW (ref 0.7–4.0)
MCH: 29.8 pg (ref 26.0–34.0)
MCHC: 32 g/dL (ref 30.0–36.0)
MCV: 93 fL (ref 80.0–100.0)
Monocytes Absolute: 0.6 10*3/uL (ref 0.1–1.0)
Monocytes Relative: 17 %
Neutro Abs: 2.2 10*3/uL (ref 1.7–7.7)
Neutrophils Relative %: 68 %
Platelet Count: 197 10*3/uL (ref 150–400)
RBC: 2.99 MIL/uL — ABNORMAL LOW (ref 4.22–5.81)
RDW: 17.7 % — ABNORMAL HIGH (ref 11.5–15.5)
WBC Count: 3.3 10*3/uL — ABNORMAL LOW (ref 4.0–10.5)
nRBC: 0 % (ref 0.0–0.2)

## 2021-05-15 LAB — CMP (CANCER CENTER ONLY)
ALT: 19 U/L (ref 0–44)
AST: 20 U/L (ref 15–41)
Albumin: 3.3 g/dL — ABNORMAL LOW (ref 3.5–5.0)
Alkaline Phosphatase: 75 U/L (ref 38–126)
Anion gap: 10 (ref 5–15)
BUN: 16 mg/dL (ref 8–23)
CO2: 23 mmol/L (ref 22–32)
Calcium: 8.4 mg/dL — ABNORMAL LOW (ref 8.9–10.3)
Chloride: 104 mmol/L (ref 98–111)
Creatinine: 1.09 mg/dL (ref 0.61–1.24)
GFR, Estimated: >60 mL/min (ref >60)
Glucose, Bld: 193 mg/dL — ABNORMAL HIGH (ref 70–99)
Potassium: 3.6 mmol/L (ref 3.5–5.1)
Sodium: 137 mmol/L (ref 135–145)
Total Bilirubin: 1.3 mg/dL — ABNORMAL HIGH (ref 0.3–1.2)
Total Protein: 5.8 g/dL — ABNORMAL LOW (ref 6.5–8.1)

## 2021-05-15 MED ORDER — ONDANSETRON HCL 8 MG PO TABS
8.0000 mg | ORAL_TABLET | Freq: Once | ORAL | Status: AC
  Administered 2021-05-15: 8 mg via ORAL
  Filled 2021-05-15: qty 1

## 2021-05-15 MED ORDER — SODIUM CHLORIDE 0.9 % IV SOLN
5.0000 mg | Freq: Once | INTRAVENOUS | Status: DC
  Filled 2021-05-15: qty 0.5

## 2021-05-15 MED ORDER — PACLITAXEL PROTEIN-BOUND CHEMO INJECTION 100 MG
100.0000 mg/m2 | Freq: Once | INTRAVENOUS | Status: AC
  Administered 2021-05-15: 250 mg via INTRAVENOUS
  Filled 2021-05-15: qty 50

## 2021-05-15 MED ORDER — SODIUM CHLORIDE 0.9 % IV SOLN
1000.0000 mg/m2 | Freq: Once | INTRAVENOUS | Status: AC
  Administered 2021-05-15: 2546 mg via INTRAVENOUS
  Filled 2021-05-15: qty 52.6

## 2021-05-15 MED ORDER — DEXAMETHASONE SODIUM PHOSPHATE 10 MG/ML IJ SOLN
5.0000 mg | Freq: Once | INTRAMUSCULAR | Status: AC
  Administered 2021-05-15: 5 mg via INTRAVENOUS
  Filled 2021-05-15: qty 1

## 2021-05-15 MED ORDER — SODIUM CHLORIDE 0.9% FLUSH
10.0000 mL | INTRAVENOUS | Status: DC | PRN
  Administered 2021-05-15: 10 mL
  Filled 2021-05-15: qty 10

## 2021-05-15 MED ORDER — HEPARIN SOD (PORK) LOCK FLUSH 100 UNIT/ML IV SOLN
500.0000 [IU] | Freq: Once | INTRAVENOUS | Status: AC | PRN
  Administered 2021-05-15: 500 [IU]
  Filled 2021-05-15: qty 5

## 2021-05-15 MED ORDER — SODIUM CHLORIDE 0.9 % IV SOLN
Freq: Once | INTRAVENOUS | Status: AC
Start: 2021-05-15 — End: 2021-05-15
  Filled 2021-05-15: qty 250

## 2021-05-15 NOTE — Progress Notes (Signed)
  [facility] OFFICE PROGRESS NOTE   Diagnosis: Pancreas cancer  INTERVAL HISTORY:   Mr. Canion complete another cycle of gemcitabine/Abraxane on 05/01/2021.  He felt well following this cycle of chemotherapy.  No rash, fever, nausea, or neuropathy symptoms.  No complaint.  Objective:  Vital signs in last 24 hours:  Blood pressure 138/64, pulse 95, temperature 98.7 F (37.1 C), temperature source Oral, resp. rate (!) 24, weight 269 lb 6.4 oz (122.2 kg), SpO2 96 %.    HEENT: No thrush or ulcers Resp: Lungs clear bilaterally Cardio: Regular rate and rhythm GI: No hepatosplenomegaly, no mass, nontender Vascular: Trace edema at the right greater than left lower leg    Portacath/PICC-without erythema  Lab Results:  Lab Results  Component Value Date   WBC 3.3 (L) 05/15/2021   HGB 8.9 (L) 05/15/2021   HCT 27.8 (L) 05/15/2021   MCV 93.0 05/15/2021   PLT 197 05/15/2021   NEUTROABS 2.2 05/15/2021    CMP  Lab Results  Component Value Date   NA 137 05/15/2021   K 3.6 05/15/2021   CL 104 05/15/2021   CO2 23 05/15/2021   GLUCOSE 193 (H) 05/15/2021   BUN 16 05/15/2021   CREATININE 1.09 05/15/2021   CALCIUM 8.4 (L) 05/15/2021   PROT 5.8 (L) 05/15/2021   ALBUMIN 3.3 (L) 05/15/2021   AST 20 05/15/2021   ALT 19 05/15/2021   ALKPHOS 75 05/15/2021   BILITOT 1.3 (H) 05/15/2021   GFRNONAA >60 05/15/2021   GFRAA 49 (L) 10/10/2020    No results found for: CEA1   Medications: I have reviewed the patient's current medications.   Assessment/Plan: 1. 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 ? 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 Normal CA 19-9 01/24/2021 ? CTs 01/29/2021-no pancreatic mass. No pancreatic ductal dilatation identified. No definite signs of metastatic disease in the chest, abdomen or pelvis. ? Cycle 1 gemcitabine/Abraxane 03/01/2021 ? Cycle 2 gemcitabine/Abraxane 03/14/2021 ? Cycle 3 gemcitabine/Abraxane 03/28/2021 ? Cycle 4 gemcitabine/Abraxane 04/17/2021 ? Cycle 5 gemcitabine/Abraxane 05/01/2021, Zofran/Decadron added ? Cycle 6 gemcitabine/Abraxane 05/15/2021 2. Diabetes 3. Coronary artery disease 4. Prostate cancer 2013-treated with radiation at [facility] 5. Hypertension 6. Sleep apnea 7.Coronary artery bypass surgery 2004 8. Hospital admission 03/04/2021-syncope, A. Fib-started on Eliquis 9. Mild thrombocytopeniafollowing cycle 1 gemcitabine/Abraxane-resolved 10.  Anemia     Disposition: Mr Faria appears stable.  He tolerated last cycle of chemotherapy well.  He will complete another cycle of gemcitabine/Abraxane today.  He will undergo a restaging evaluation after this cycle.  I will contact Dr. Zenia Resides for her recommendation regarding a restaging CT, MRI, or repeat EUS.  Mr. Mcconaghy will return for an office visit in 2 weeks.  Betsy Coder, MD  05/15/2021  9:54 AM

## 2021-05-15 NOTE — Patient Instructions (Signed)
[name]    Discharge Instructions:  Thank you for choosing [facility] to provide your oncology and hematology care.   If you have a lab appointment with the [facility], please go directly to the [facility] and check in at the registration area.   Wear comfortable clothing and clothing appropriate for easy access to any Portacath or PICC line.   We strive to give you quality time with your provider. You may need to reschedule your appointment if you arrive late (15 or more minutes).  Arriving late affects you and other patients whose appointments are after yours.  Also, if you miss three or more appointments without notifying the office, you may be dismissed from the clinic at the provider's discretion.      For prescription refill requests, have your pharmacy contact our office and allow 72 hours for refills to be completed.    Today you received the following chemotherapy and/or immunotherapy agents Paclitaxel-protein bound (ABRAXANE) & Gemcitabine (GEMZAR).      To help prevent nausea and vomiting after your treatment, we encourage you to take your nausea medication as directed.  BELOW ARE SYMPTOMS THAT SHOULD BE REPORTED IMMEDIATELY: . *FEVER GREATER THAN 100.4 F (38 C) OR HIGHER . *CHILLS OR SWEATING . *NAUSEA AND VOMITING THAT IS NOT CONTROLLED WITH YOUR NAUSEA MEDICATION . *UNUSUAL SHORTNESS OF BREATH . *UNUSUAL BRUISING OR BLEEDING . *URINARY PROBLEMS (pain or burning when urinating, or frequent urination) . *BOWEL PROBLEMS (unusual diarrhea, constipation, pain near the anus) . TENDERNESS IN MOUTH AND THROAT WITH OR WITHOUT PRESENCE OF ULCERS (sore throat, sores in mouth, or a toothache) . UNUSUAL RASH, SWELLING OR PAIN  . UNUSUAL VAGINAL DISCHARGE OR ITCHING   Items with * indicate a potential emergency and should be followed up as soon as possible or go to the Emergency Department if any problems should occur.  Please show  the CHEMOTHERAPY ALERT CARD or IMMUNOTHERAPY ALERT CARD at check-in to the Emergency Department and triage nurse.  Should you have questions after your visit or need to cancel or reschedule your appointment, please contact [facility]  Dept: [phone]  and follow the prompts.  Office hours are 8:00 a.m. to 4:30 p.m. Monday - Friday. Please note that voicemails left after 4:00 p.m. may not be returned until the following business day.  We are closed weekends and major holidays. You have access to a nurse at all times for urgent questions. Please call the main number to the clinic Dept: [phone] and follow the prompts.   For any non-urgent questions, you may also contact your provider using MyChart. We now offer e-Visits for anyone 34 and older to request care online for non-urgent symptoms. For details visit [email or web address].   Also download the MyChart app! Go to the app store, search "MyChart", open the app, select [facility], and log in with your MyChart username and password.  Due to Covid, a mask is required upon entering the hospital/clinic. If you do not have a mask, one will be given to you upon arrival. For doctor visits, patients may have 1 support person aged 47 or older with them. For treatment visits, patients cannot have anyone with them due to current Covid guidelines and our immunocompromised population.   Nanoparticle Albumin-Bound Paclitaxel injection What is this medicine? NANOPARTICLE ALBUMIN-BOUND PACLITAXEL (Na no PAHR ti kuhl al BYOO muhn-bound PAK li TAX el) is a chemotherapy drug. It targets fast dividing  cells, like cancer cells, and causes these cells to die. This medicine is used to treat advanced breast cancer, lung cancer, and pancreatic cancer. This medicine may be used for other purposes; ask your health care provider or pharmacist if you have questions. COMMON BRAND NAME(S): Abraxane What should I tell my health care provider  before I take this medicine? They need to know if you have any of these conditions:  kidney disease  liver disease  low blood counts, like low white cell, platelet, or red cell counts  lung or breathing disease, like asthma  tingling of the fingers or toes, or other nerve disorder  an unusual or allergic reaction to paclitaxel, albumin, other chemotherapy, other medicines, foods, dyes, or preservatives  pregnant or trying to get pregnant  breast-feeding How should I use this medicine? This drug is given as an infusion into a vein. It is administered in a hospital or clinic by a specially trained health care professional. Talk to your pediatrician regarding the use of this medicine in children. Special care may be needed. Overdosage: If you think you have taken too much of this medicine contact a poison control center or emergency room at once. NOTE: This medicine is only for you. Do not share this medicine with others. What if I miss a dose? It is important not to miss your dose. Call your doctor or health care professional if you are unable to keep an appointment. What may interact with this medicine? This medicine may interact with the following medications:  antiviral medicines for hepatitis, HIV or AIDS  certain antibiotics like erythromycin and clarithromycin  certain medicines for fungal infections like ketoconazole and itraconazole  certain medicines for seizures like carbamazepine, phenobarbital, phenytoin  gemfibrozil  nefazodone  rifampin  St. John's wort This list may not describe all possible interactions. Give your health care provider a list of all the medicines, herbs, non-prescription drugs, or dietary supplements you use. Also tell them if you smoke, drink alcohol, or use illegal drugs. Some items may interact with your medicine. What should I watch for while using this medicine? Your condition will be monitored carefully while you are receiving this  medicine. You will need important blood work done while you are taking this medicine. This medicine can cause serious allergic reactions. If you experience allergic reactions like skin rash, itching or hives, swelling of the face, lips, or tongue, tell your doctor or health care professional right away. In some cases, you may be given additional medicines to help with side effects. Follow all directions for their use. This drug may make you feel generally unwell. This is not uncommon, as chemotherapy can affect healthy cells as well as cancer cells. Report any side effects. Continue your course of treatment even though you feel ill unless your doctor tells you to stop. Call your doctor or health care professional for advice if you get a fever, chills or sore throat, or other symptoms of a cold or flu. Do not treat yourself. This drug decreases your body's ability to fight infections. Try to avoid being around people who are sick. This medicine may increase your risk to bruise or bleed. Call your doctor or health care professional if you notice any unusual bleeding. Be careful brushing and flossing your teeth or using a toothpick because you may get an infection or bleed more easily. If you have any dental work done, tell your dentist you are receiving this medicine. Avoid taking products that contain aspirin, acetaminophen, ibuprofen,  naproxen, or ketoprofen unless instructed by your doctor. These medicines may hide a fever. Do not become pregnant while taking this medicine or for 6 months after stopping it. Women should inform their doctor if they wish to become pregnant or think they might be pregnant. Men should not father a child while taking this medicine or for 3 months after stopping it. There is a potential for serious side effects to an unborn child. Talk to your health care professional or pharmacist for more information. Do not breast-feed an infant while taking this medicine or for 2 weeks after  stopping it. This medicine may interfere with the ability to get pregnant or to father a child. You should talk to your doctor or health care professional if you are concerned about your fertility. What side effects may I notice from receiving this medicine? Side effects that you should report to your doctor or health care professional as soon as possible:  allergic reactions like skin rash, itching or hives, swelling of the face, lips, or tongue  breathing problems  changes in vision  fast, irregular heartbeat  low blood pressure  mouth sores  pain, tingling, numbness in the hands or feet  signs of decreased platelets or bleeding - bruising, pinpoint red spots on the skin, black, tarry stools, blood in the urine  signs of decreased red blood cells - unusually weak or tired, feeling faint or lightheaded, falls  signs of infection - fever or chills, cough, sore throat, pain or difficulty passing urine  signs and symptoms of liver injury like dark yellow or brown urine; general ill feeling or flu-like symptoms; light-colored stools; loss of appetite; nausea; right upper belly pain; unusually weak or tired; yellowing of the eyes or skin  swelling of the ankles, feet, hands  unusually slow heartbeat Side effects that usually do not require medical attention (report to your doctor or health care professional if they continue or are bothersome):  diarrhea  hair loss  loss of appetite  nausea, vomiting  tiredness This list may not describe all possible side effects. Call your doctor for medical advice about side effects. You may report side effects to FDA at 1-800-FDA-1088. Where should I keep my medicine? This drug is given in a hospital or clinic and will not be stored at home. NOTE: This sheet is a summary. It may not cover all possible information. If you have questions about this medicine, talk to your doctor, pharmacist, or health care provider.  2021 Elsevier/Gold  Standard (2017-08-04 13:03:45)  Gemcitabine injection What is this medicine? GEMCITABINE (jem SYE ta been) is a chemotherapy drug. This medicine is used to treat many types of cancer like breast cancer, lung cancer, pancreatic cancer, and ovarian cancer. This medicine may be used for other purposes; ask your health care provider or pharmacist if you have questions. COMMON BRAND NAME(S): Gemzar, Infugem What should I tell my health care provider before I take this medicine? They need to know if you have any of these conditions:  blood disorders  infection  kidney disease  liver disease  lung or breathing disease, like asthma  recent or ongoing radiation therapy  an unusual or allergic reaction to gemcitabine, other chemotherapy, other medicines, foods, dyes, or preservatives  pregnant or trying to get pregnant  breast-feeding How should I use this medicine? This drug is given as an infusion into a vein. It is administered in a hospital or clinic by a specially trained health care professional. Talk to your pediatrician  regarding the use of this medicine in children. Special care may be needed. Overdosage: If you think you have taken too much of this medicine contact a poison control center or emergency room at once. NOTE: This medicine is only for you. Do not share this medicine with others. What if I miss a dose? It is important not to miss your dose. Call your doctor or health care professional if you are unable to keep an appointment. What may interact with this medicine?  medicines to increase blood counts like filgrastim, pegfilgrastim, sargramostim  some other chemotherapy drugs like cisplatin  vaccines Talk to your doctor or health care professional before taking any of these medicines:  acetaminophen  aspirin  ibuprofen  ketoprofen  naproxen This list may not describe all possible interactions. Give your health care provider a list of all the medicines,  herbs, non-prescription drugs, or dietary supplements you use. Also tell them if you smoke, drink alcohol, or use illegal drugs. Some items may interact with your medicine. What should I watch for while using this medicine? Visit your doctor for checks on your progress. This drug may make you feel generally unwell. This is not uncommon, as chemotherapy can affect healthy cells as well as cancer cells. Report any side effects. Continue your course of treatment even though you feel ill unless your doctor tells you to stop. In some cases, you may be given additional medicines to help with side effects. Follow all directions for their use. Call your doctor or health care professional for advice if you get a fever, chills or sore throat, or other symptoms of a cold or flu. Do not treat yourself. This drug decreases your body's ability to fight infections. Try to avoid being around people who are sick. This medicine may increase your risk to bruise or bleed. Call your doctor or health care professional if you notice any unusual bleeding. Be careful brushing and flossing your teeth or using a toothpick because you may get an infection or bleed more easily. If you have any dental work done, tell your dentist you are receiving this medicine. Avoid taking products that contain aspirin, acetaminophen, ibuprofen, naproxen, or ketoprofen unless instructed by your doctor. These medicines may hide a fever. Do not become pregnant while taking this medicine or for 6 months after stopping it. Women should inform their doctor if they wish to become pregnant or think they might be pregnant. Men should not father a child while taking this medicine and for 3 months after stopping it. There is a potential for serious side effects to an unborn child. Talk to your health care professional or pharmacist for more information. Do not breast-feed an infant while taking this medicine or for at least 1 week after stopping it. Men should  inform their doctors if they wish to father a child. This medicine may lower sperm counts. Talk with your doctor or health care professional if you are concerned about your fertility. What side effects may I notice from receiving this medicine? Side effects that you should report to your doctor or health care professional as soon as possible:  allergic reactions like skin rash, itching or hives, swelling of the face, lips, or tongue  breathing problems  pain, redness, or irritation at site where injected  signs and symptoms of a dangerous change in heartbeat or heart rhythm like chest pain; dizziness; fast or irregular heartbeat; palpitations; feeling faint or lightheaded, falls; breathing problems  signs of decreased platelets or bleeding -  bruising, pinpoint red spots on the skin, black, tarry stools, blood in the urine  signs of decreased red blood cells - unusually weak or tired, feeling faint or lightheaded, falls  signs of infection - fever or chills, cough, sore throat, pain or difficulty passing urine  signs and symptoms of kidney injury like trouble passing urine or change in the amount of urine  signs and symptoms of liver injury like dark yellow or brown urine; general ill feeling or flu-like symptoms; light-colored stools; loss of appetite; nausea; right upper belly pain; unusually weak or tired; yellowing of the eyes or skin  swelling of ankles, feet, hands Side effects that usually do not require medical attention (report to your doctor or health care professional if they continue or are bothersome):  constipation  diarrhea  hair loss  loss of appetite  nausea  rash  vomiting This list may not describe all possible side effects. Call your doctor for medical advice about side effects. You may report side effects to FDA at 1-800-FDA-1088. Where should I keep my medicine? This drug is given in a hospital or clinic and will not be stored at home. NOTE: This sheet is  a summary. It may not cover all possible information. If you have questions about this medicine, talk to your doctor, pharmacist, or health care provider.  2021 Elsevier/Gold Standard (2018-02-24 18:06:11)

## 2021-05-20 ENCOUNTER — Encounter: Payer: Self-pay | Admitting: Oncology

## 2021-05-22 ENCOUNTER — Encounter: Payer: Self-pay | Admitting: Oncology

## 2021-05-27 ENCOUNTER — Ambulatory Visit (HOSPITAL_BASED_OUTPATIENT_CLINIC_OR_DEPARTMENT_OTHER)
Admission: RE | Admit: 2021-05-27 | Discharge: 2021-05-27 | Disposition: A | Discharge status: Home / Self Care | Payer: Medicare Other | Source: Ambulatory Visit | Attending: Oncology | Admitting: Oncology

## 2021-05-27 ENCOUNTER — Inpatient Hospital Stay: Payer: Medicare Other

## 2021-05-27 ENCOUNTER — Inpatient Hospital Stay (HOSPITAL_BASED_OUTPATIENT_CLINIC_OR_DEPARTMENT_OTHER): Payer: Medicare Other | Admitting: Nurse Practitioner

## 2021-05-27 ENCOUNTER — Encounter: Payer: Self-pay | Admitting: Oncology

## 2021-05-27 ENCOUNTER — Other Ambulatory Visit: Payer: Self-pay

## 2021-05-27 ENCOUNTER — Encounter: Payer: Self-pay | Admitting: Nurse Practitioner

## 2021-05-27 VITALS — BP 134/66 | HR 100 | Temp 97.8°F | Resp 18 | Ht 71.0 in | Wt 265.8 lb

## 2021-05-27 DIAGNOSIS — I700 Atherosclerosis of aorta: Secondary | ICD-10-CM | POA: Diagnosis not present

## 2021-05-27 DIAGNOSIS — C250 Malignant neoplasm of head of pancreas: Secondary | ICD-10-CM

## 2021-05-27 DIAGNOSIS — C259 Malignant neoplasm of pancreas, unspecified: Secondary | ICD-10-CM | POA: Diagnosis not present

## 2021-05-27 IMAGING — CT CT ABDOMEN WO/W CM
2 of 12 series · 10 of 46 positions shown, 16 images · IV contrast (APPLIED)
Comparison: CT chest [DATE] and CT abdomen pelvis [DATE].
MR abdomen [DATE].

CLINICAL DATA: Restaging pancreas cancer.

EXAM:
CT ABDOMEN WITHOUT AND WITH CONTRAST
TECHNIQUE: Multidetector CT imaging of the abdomen was performed following the
standard protocol before and following the bolus administration of
intravenous contrast.
CONTRAST:  75mL OMNIPAQUE IOHEXOL 350 MG/ML SOLN

[Series 9: cor arterial · coronal · arterial · 0.60mm/px · 3 of 164 slices shown, 4 images]
[im 41/164  soft-tissue]
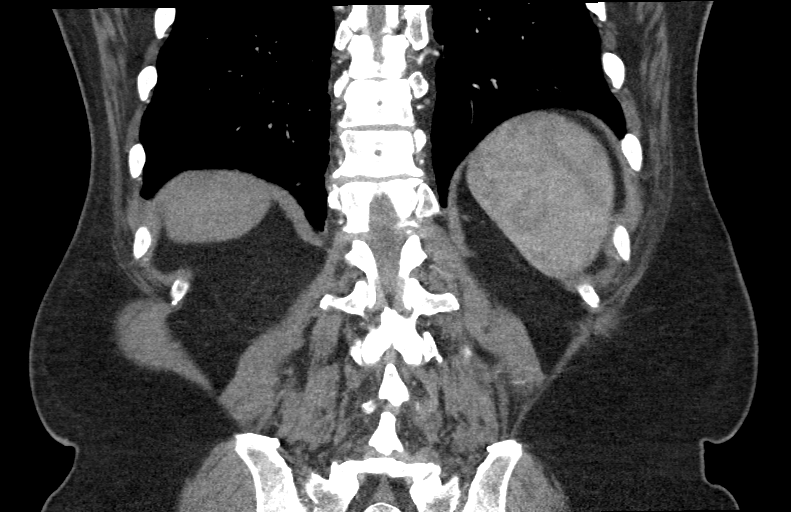
[im 82/164  soft-tissue]
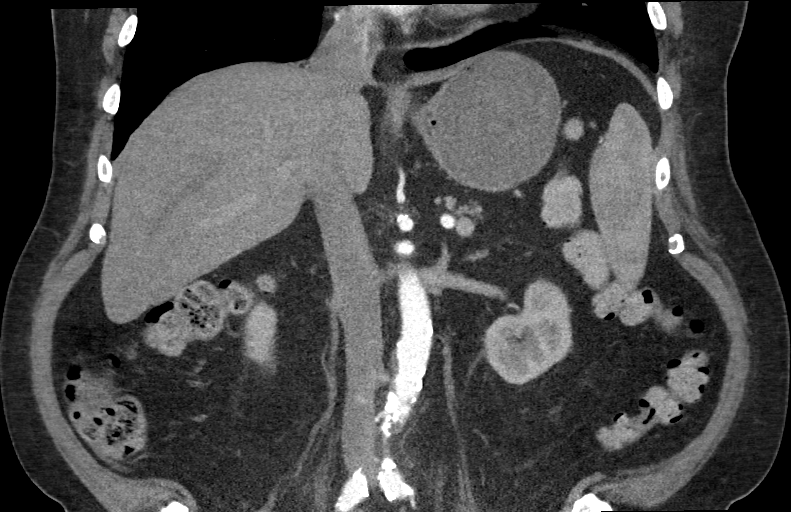
[im 82/164  bone]
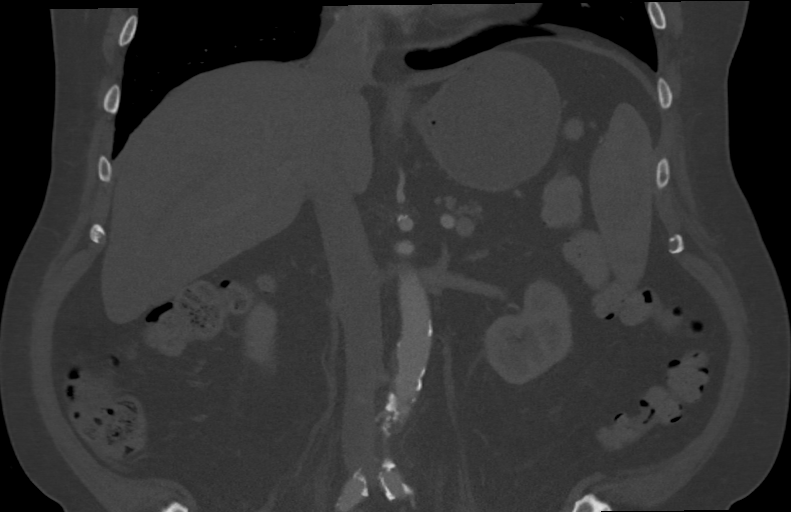
[im 123/164  soft-tissue]
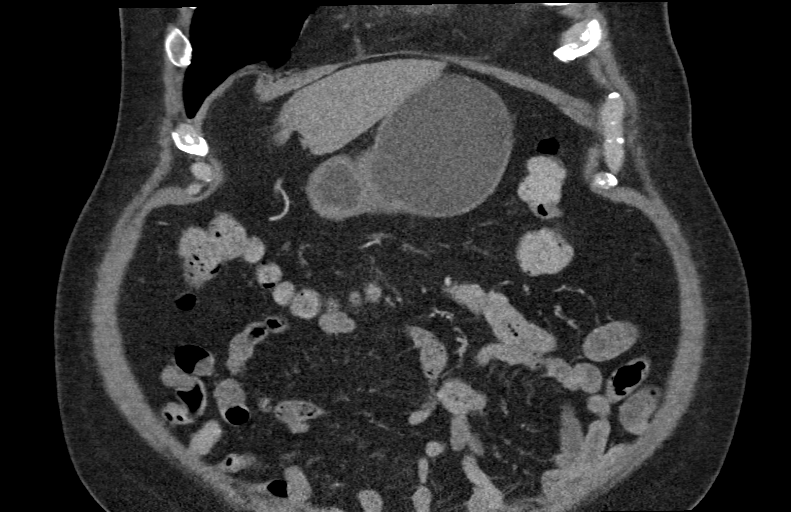

[Series 13: portal pv 2.0 · axial · portal-venous · 0.96mm/px · z∈[+1192,+1420]mm · 7 of 152 slices shown, 12 images]
[im 19/152  soft-tissue]
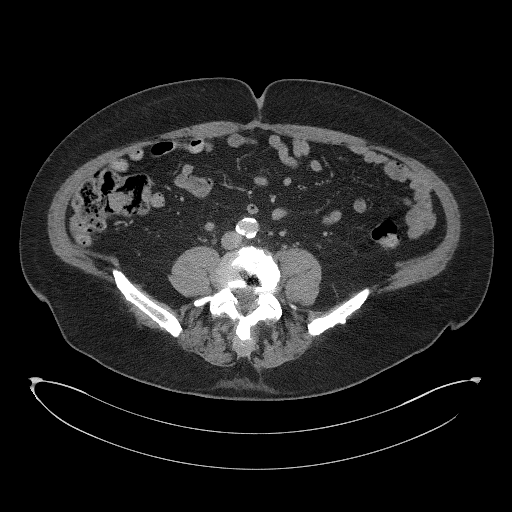
[im 19/152  bone]
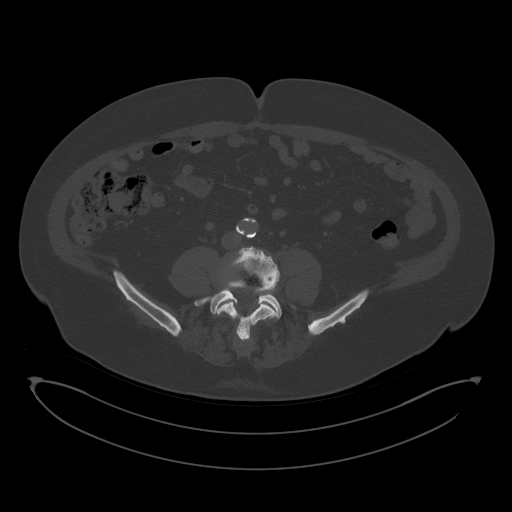
[im 38/152  soft-tissue]
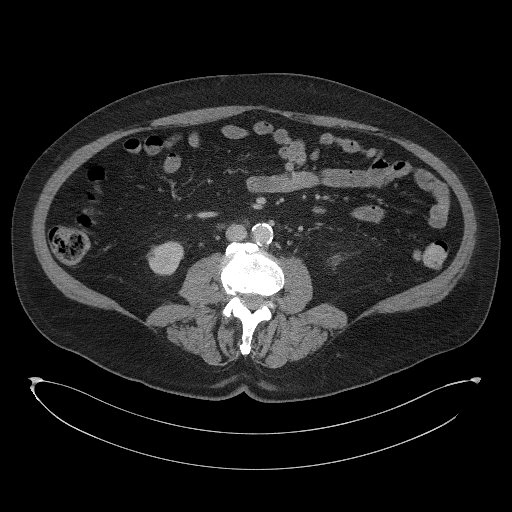
[im 57/152  soft-tissue]
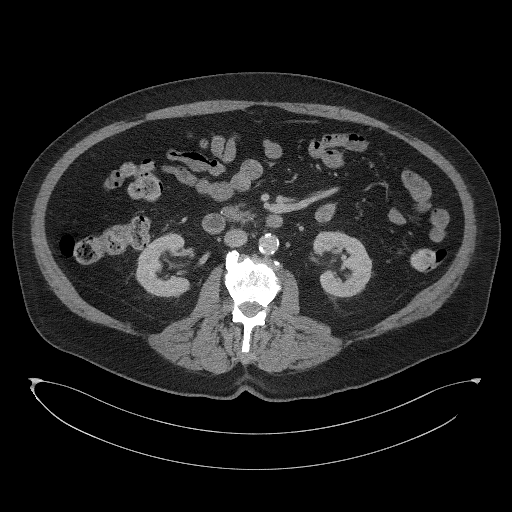
[im 76/152  soft-tissue]
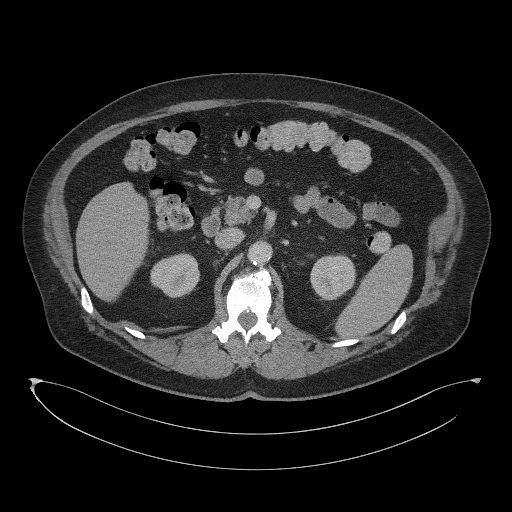
[im 76/152  lung]
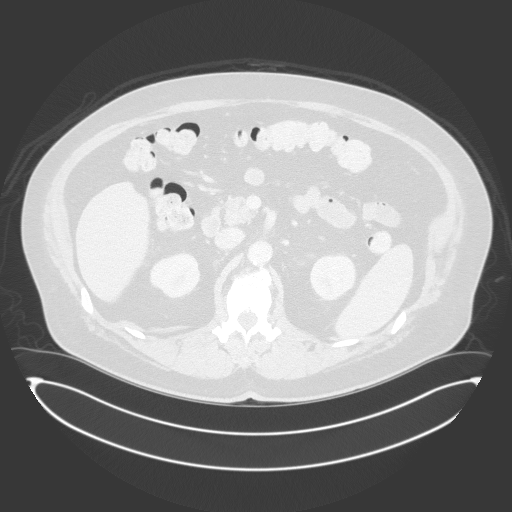
[im 95/152  soft-tissue]
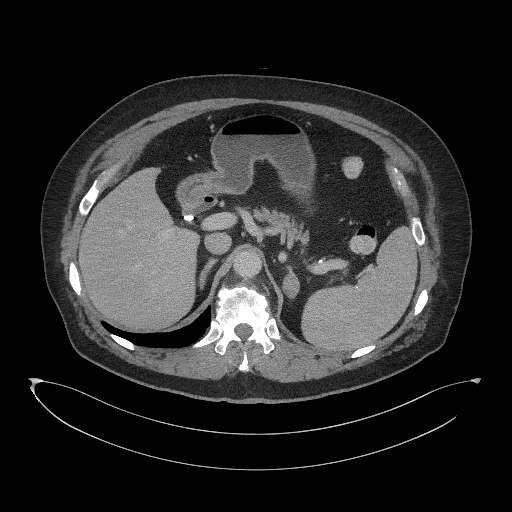
[im 95/152  lung]
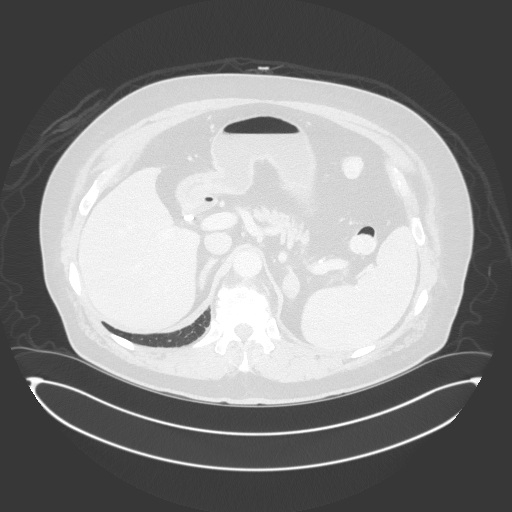
[im 114/152  soft-tissue]
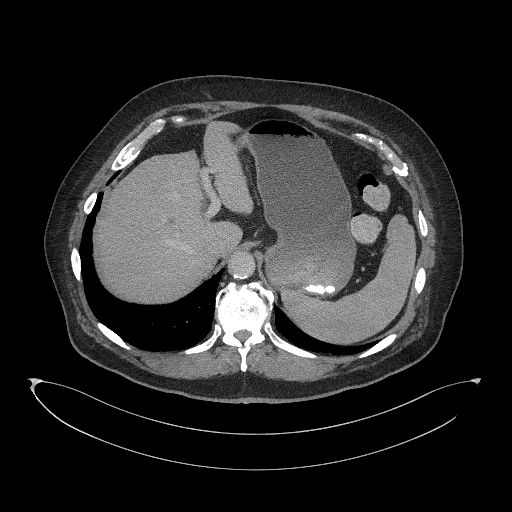
[im 114/152  lung]
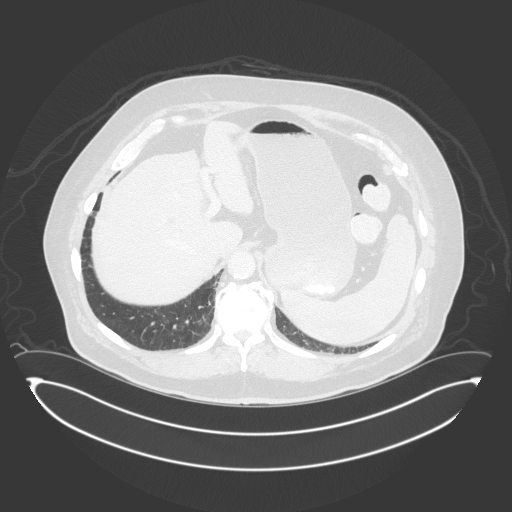
[im 133/152  soft-tissue]
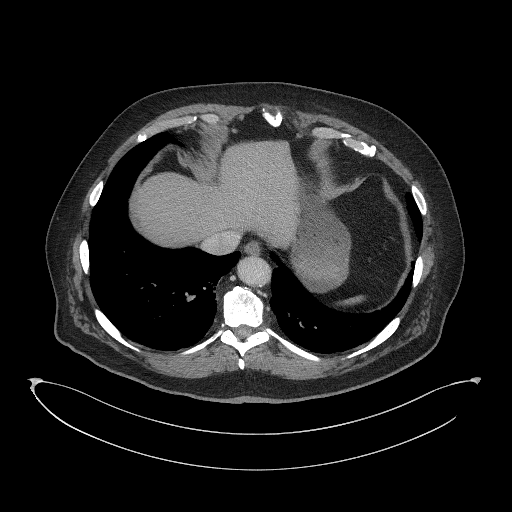
[im 133/152  lung]
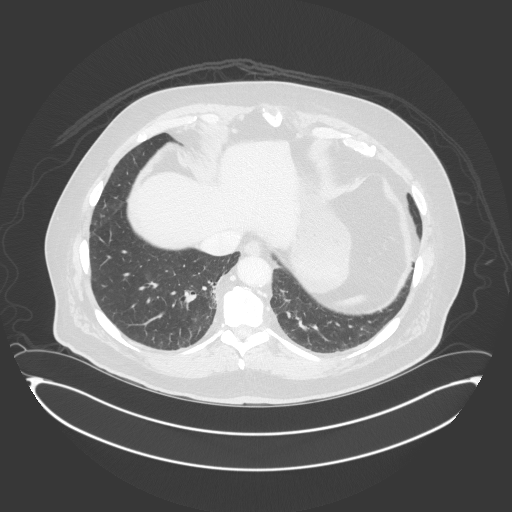

[10 of 46 positions shown; findings below may reference images not displayed]

FINDINGS: Lower chest: No acute findings in the lung bases. Heart is at the
upper limits of normal in size. Atherosclerotic calcification of the
aorta and coronary arteries. No pericardial or pleural effusion.
Distal esophagus is unremarkable.

Hepatobiliary: Liver is unremarkable. Cholecystectomy. No biliary
ductal dilatation.

Pancreas: 8 mm exophytic low-attenuation lesion off the posterior
body of the pancreas (6/56), unchanged. No ductal dilatation or
gland atrophy.

Spleen: Negative.

Adrenals/Urinary Tract: Right adrenal gland is unremarkable. Fluid
density left adrenal nodule measures 2.4 cm. Subcentimeter
low-attenuation lesions in the kidneys are too small to characterize
but statistically, cysts are likely. Visualized ureters are
decompressed.

Stomach/Bowel: Stomach and visualized portions of the small bowel
and colon are unremarkable.

Vascular/Lymphatic: Atherosclerotic calcification of the aorta. No
pathologically enlarged lymph nodes.

Other: No free fluid.  Mesenteries and peritoneum are unremarkable.

Musculoskeletal: Degenerative changes in the spine no worrisome
lytic or sclerotic lesions.
IMPRESSION: 1. 8 mm exophytic fluid density lesion off the body of the pancreas,
unchanged. Continued attention on follow-up exams is warranted.
2. Left adrenal adenoma.
3. Aortic atherosclerosis ([A0]-[A0]). Coronary artery
calcification.

## 2021-05-27 MED ORDER — IOHEXOL 350 MG/ML SOLN
75.0000 mL | Freq: Once | INTRAVENOUS | Status: AC | PRN
  Administered 2021-05-27: 75 mL via INTRAVENOUS

## 2021-05-27 MED ORDER — DOXYCYCLINE HYCLATE 100 MG PO TABS: 100.0000 mg | ORAL_TABLET | Freq: Two times a day (BID) | ORAL | 0 refills | Status: DC

## 2021-05-27 NOTE — Progress Notes (Signed)
  [facility] OFFICE PROGRESS NOTE   Diagnosis: Pancreas cancer  INTERVAL HISTORY:   [name] returns prior to scheduled follow-up for evaluation of his toe.  He reports bleeding from the left great toe/nail 2 days ago.  Reports a long history of a "fungus".  He notes some redness and drainage as well.  No fever or chills.  No known injury.  Objective:  Vital signs in last 24 hours:  Blood pressure 134/66, pulse 100, temperature 97.8 F (36.6 C), temperature source Oral, resp. rate 18, height 5\' 11"  (1.803 m), weight 265 lb 12.8 oz (120.6 kg), SpO2 100 %.   Vascular: No edema of the left foot/leg.  Pedal pulse intact. Skin: Left great toenail bed with mild surrounding erythema.  Nail appears diseased with fungus.  The nail is loose.  With pressure there is serosanguineous drainage from the nailbed.  Nontender.   Lab Results:  Lab Results  Component Value Date   WBC 3.3 (L) 05/15/2021   HGB 8.9 (L) 05/15/2021   HCT 27.8 (L) 05/15/2021   MCV 93.0 05/15/2021   PLT 197 05/15/2021   NEUTROABS 2.2 05/15/2021    Imaging:  No results found.  Medications: I have reviewed the patient's current medications.  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  No pancreatic ductal dilatation identified.  No definite signs of metastatic disease in the chest, abdomen or pelvis. Cycle 1  gemcitabine/Abraxane 03/01/2021 Cycle 2 gemcitabine/Abraxane 03/14/2021 Cycle 3 gemcitabine/Abraxane 03/28/2021 Cycle 4 gemcitabine/Abraxane 04/17/2021 Cycle 5 gemcitabine/Abraxane 05/01/2021, Zofran/Decadron added Cycle 6 gemcitabine/Abraxane 05/15/2021 Diabetes Coronary artery disease Prostate cancer 2013-treated with radiation at [facility] Hypertension Sleep apnea 7.  Coronary artery bypass surgery 2004 8.  Hospital admission 03/04/2021-syncope, A. Fib-started on Eliquis 9.  Mild thrombocytopenia following cycle 1 gemcitabine/Abraxane-resolved 10.  Anemia  Disposition: [name] appears stable.  He has completed 6 cycles of gemcitabine/Abraxane.  He presents to the office today for Port-A-Cath access prior to CT scans.  He requested an appointment for evaluation of the left great toe.  He appears to have an infection.  He will begin a course of doxycycline and begin toe soaks.  We discussed potential side effects associated with doxycycline.  He agrees to proceed.  We were able to get him an appointment with a podiatrist in 2 days.    Ned Card ANP/GNP-BC   05/27/2021  10:55 AM

## 2021-05-28 ENCOUNTER — Telehealth: Payer: Self-pay | Admitting: Nurse Practitioner

## 2021-05-28 NOTE — Telephone Encounter (Signed)
No los 6/13

## 2021-05-29 ENCOUNTER — Telehealth: Payer: Self-pay

## 2021-05-29 ENCOUNTER — Inpatient Hospital Stay: Payer: Medicare Other

## 2021-05-29 ENCOUNTER — Inpatient Hospital Stay: Payer: Medicare Other | Admitting: Nurse Practitioner

## 2021-05-29 DIAGNOSIS — L600 Ingrowing nail: Secondary | ICD-10-CM | POA: Diagnosis not present

## 2021-05-29 DIAGNOSIS — M25775 Osteophyte, left foot: Secondary | ICD-10-CM | POA: Diagnosis not present

## 2021-05-29 DIAGNOSIS — M7731 Calcaneal spur, right foot: Secondary | ICD-10-CM | POA: Diagnosis not present

## 2021-05-29 NOTE — Telephone Encounter (Signed)
Called message left inquiring about missed appointment this morning requested pt return call to reschedule and/or see if appointment can be fulfilled this day  Currently;y awaiting return call

## 2021-06-04 ENCOUNTER — Other Ambulatory Visit: Payer: Self-pay

## 2021-06-04 ENCOUNTER — Ambulatory Visit: Payer: Medicare Other | Admitting: Cardiology

## 2021-06-04 ENCOUNTER — Encounter: Payer: Self-pay | Admitting: Oncology

## 2021-06-04 ENCOUNTER — Encounter: Payer: Self-pay | Admitting: Family Medicine

## 2021-06-04 ENCOUNTER — Ambulatory Visit (INDEPENDENT_AMBULATORY_CARE_PROVIDER_SITE_OTHER): Payer: Medicare Other | Admitting: Family Medicine

## 2021-06-04 VITALS — BP 150/82 | HR 94 | Temp 98.8°F | Resp 16 | Ht 71.0 in | Wt 265.0 lb

## 2021-06-04 DIAGNOSIS — I872 Venous insufficiency (chronic) (peripheral): Secondary | ICD-10-CM

## 2021-06-04 DIAGNOSIS — F324 Major depressive disorder, single episode, in partial remission: Secondary | ICD-10-CM

## 2021-06-04 DIAGNOSIS — Z794 Long term (current) use of insulin: Secondary | ICD-10-CM | POA: Diagnosis not present

## 2021-06-04 DIAGNOSIS — G629 Polyneuropathy, unspecified: Secondary | ICD-10-CM | POA: Diagnosis not present

## 2021-06-04 DIAGNOSIS — I25810 Atherosclerosis of coronary artery bypass graft(s) without angina pectoris: Secondary | ICD-10-CM

## 2021-06-04 DIAGNOSIS — E1122 Type 2 diabetes mellitus with diabetic chronic kidney disease: Secondary | ICD-10-CM

## 2021-06-04 DIAGNOSIS — Z7901 Long term (current) use of anticoagulants: Secondary | ICD-10-CM

## 2021-06-04 DIAGNOSIS — I25708 Atherosclerosis of coronary artery bypass graft(s), unspecified, with other forms of angina pectoris: Secondary | ICD-10-CM | POA: Diagnosis not present

## 2021-06-04 DIAGNOSIS — L03032 Cellulitis of left toe: Secondary | ICD-10-CM | POA: Diagnosis not present

## 2021-06-04 DIAGNOSIS — N1832 Chronic kidney disease, stage 3b: Secondary | ICD-10-CM

## 2021-06-04 DIAGNOSIS — N183 Chronic kidney disease, stage 3 unspecified: Secondary | ICD-10-CM

## 2021-06-04 DIAGNOSIS — B372 Candidiasis of skin and nail: Secondary | ICD-10-CM | POA: Diagnosis not present

## 2021-06-04 LAB — POCT GLYCOSYLATED HEMOGLOBIN (HGB A1C): Hemoglobin A1C: 4.8 % (ref 4.0–5.6)

## 2021-06-04 MED ORDER — NYSTATIN 100000 UNIT/GM EX POWD: 1.0000 "application " | Freq: Three times a day (TID) | CUTANEOUS | 3 refills | Status: DC

## 2021-06-04 MED ORDER — CLOTRIMAZOLE-BETAMETHASONE 1-0.05 % EX CREA: 1.0000 "application " | TOPICAL_CREAM | Freq: Every day | CUTANEOUS | 1 refills | Status: DC

## 2021-06-04 NOTE — Progress Notes (Signed)
HPI: [name] is a 75 y.o. male, who is here today with his wife for follow up.   He was last seen on 10/10/20. Since his last visit he has been dx'ed with pancreatic cancer. Currently on chemotherapy, 8 cycles so far. Following with oncologist, Dr [name].  Dr [name] is the surgeon, for PORT check.   DM II: Dx'ed around 2015. He is on Lantus 75 U, he is sometimes skipping it according to BS's. Skipping Lantus about 3 times in the past 1-2 weeks.  He has lost some wt due to decreased appetite. Wt loss has helped with chronic back pain.  BS's 90's-160's. Negative for polydipsia,polyuria, or polyphagia.  Lab Results  Component Value Date   HGBA1C 6.3 (H) 03/04/2021  Last eye exam earlier this year, 12/18/2020 through the [place].  Concerned about toe infection, he is following with podiatrist. Started on Doxycycline. He has not had fever or chills. According to pt, his podiatrist is waiting for his cardiologist to give recommendations in regard to Eliquis before surgery.  HTN:He was on Imdur,Losartan,Metoprolol tartrate,and HCTZ. Because episodes of hypotension he is now on Midodrine 5 mg tid. He checks BP occasionally, 140's/90's. CAD s/p CABG in 2004.  Atrial fib: Dx'ed in 02/2021. Hospitalized in 02/2021 because episode of syncope. He is on Eliquis 5 mg bid.  Echo done on 03/04/2021 showed EF of 60 to 65%, no regional wall motion abnormalities, left ventricular diastolic parameters are indeterminate  Negative for severe/frequent headache, visual changes, chest pain, dyspnea, palpitation, claudication, focal weakness. LE edema, worse at the end day and better in the morning when he gets up.  CKD III: Negative for foam in urine and decreased urine output.  Lab Results  Component Value Date   CREATININE 1.09 05/15/2021   BUN 16 05/15/2021   NA 137 05/15/2021   K 3.6 05/15/2021   CL 104 05/15/2021   CO2 23 05/15/2021   Anemia: She is on Fe Sulfate 325 mg  daily. He has not noted blood in stool,melena,or gross hematuria.  Lab Results  Component Value Date   WBC 3.3 (L) 05/15/2021   HGB 8.9 (L) 05/15/2021   HCT 27.8 (L) 05/15/2021   MCV 93.0 05/15/2021   PLT 197 05/15/2021   Depression/PSTD: He is on Fluoxetine 40 mg daily and Trazodone 100 mg at bedtime. He follows with  He feels like he is dealing well with stress. Denies depressed mood.  Depression screen [facility] 2/9 06/04/2021 10/11/2020 02/28/2020 08/31/2019  Decreased Interest 2 0 2 0  Down, Depressed, Hopeless 0 0 1 0  PHQ - 2 Score 2 0 3 0  Altered sleeping 0 - 0 -  Tired, decreased energy 2 - 1 -  Change in appetite 0 - 0 -  Feeling bad or failure about yourself  0 - 0 -  Trouble concentrating 2 - 0 -  Moving slowly or fidgety/restless 0 - 0 -  Suicidal thoughts 0 - 0 -  PHQ-9 Score 6 - 4 -  Difficult doing work/chores - - Not difficult at all -   Yeast infection on groin area, intermittently for years but getting worse. Corn starch has been recommended in the past.  Review of Systems  Constitutional:  Positive for appetite change and fatigue. Negative for activity change and fever.  HENT:  Negative for mouth sores, nosebleeds and sore throat.   Respiratory:  Negative for cough and wheezing.   Gastrointestinal:  Negative for abdominal pain, nausea and vomiting.  Endocrine: Negative for cold intolerance and heat intolerance.  Musculoskeletal:  Positive for arthralgias and gait problem.  Neurological:  Negative for syncope and facial asymmetry.  Psychiatric/Behavioral:  Negative for confusion and hallucinations.   Rest of ROS, see pertinent positives sand negatives in HPI  Current Outpatient Medications on File Prior to Visit  Medication Sig Dispense Refill   apixaban (ELIQUIS) 5 MG TABS tablet Take 5 mg by mouth 2 (two) times daily.     atorvastatin (LIPITOR) 80 MG tablet Take 40 mg by mouth at bedtime.     calcium carbonate (OS-CAL) 600 MG TABS tablet Take 600 mg by  mouth 2 (two) times daily.     cyanocobalamin 1000 MCG tablet Take 1,000 mcg by mouth daily.     cycloSPORINE (RESTASIS) 0.05 % ophthalmic emulsion Place 1 drop into both eyes 2 (two) times daily.     doxycycline (VIBRA-TABS) 100 MG tablet Take 1 tablet (100 mg total) by mouth 2 (two) times daily. 20 tablet 0   ferrous sulfate 325 (65 FE) MG tablet Take 325 mg by mouth daily with breakfast.     FLUoxetine (PROZAC) 20 MG capsule Take 40 mg by mouth daily.     fluticasone (FLONASE) 50 MCG/ACT nasal spray INSTILL 2 SPRAYS IN EACH NOSTRIL EVERY EVENING (Patient taking differently: Place 2 sprays into both nostrils daily as needed for allergies.) 16 g 3   insulin glargine (LANTUS) 100 UNIT/ML injection Inject 0.75 mLs (75 Units total) into the skin daily. (Patient taking differently: Inject 75 Units into the skin daily.) 10 mL 3   lidocaine-prilocaine (EMLA) cream Apply to port site 1-2 hours prior to use (Patient taking differently: Apply 1 application topically as needed (port access).) 30 g 3   midodrine (PROAMATINE) 5 MG tablet Take 5 mg by mouth 3 (three) times daily with meals.     Multiple Vitamin (MULTI-VITAMINS) TABS Take 1 tablet by mouth daily.     nitroGLYCERIN (NITROSTAT) 0.4 MG SL tablet Place 0.4 mg under the tongue every 5 (five) minutes as needed for chest pain.     pantoprazole (PROTONIX) 40 MG tablet Take 1 tablet (40 mg total) by mouth daily. 30 tablet 6   prochlorperazine (COMPAZINE) 10 MG tablet Take 1 tablet (10 mg total) by mouth every 6 (six) hours as needed for nausea or vomiting. 30 tablet 2   tamsulosin (FLOMAX) 0.4 MG CAPS capsule Take 0.8 mg by mouth daily.     traZODone (DESYREL) 100 MG tablet Take 100 mg by mouth at bedtime.     Wheat Dextrin (BENEFIBER PO) Take 1 tablet by mouth daily.     No current facility-administered medications on file prior to visit.   Past Medical History:  Diagnosis Date   Anemia    Arthritis    Cancer ([place])    prostate   Chronic kidney  disease    blood in urine    Constipation    Coronary artery disease    Depression    Diabetes mellitus without complication ([place])    Difficult intubation    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 it was hard to get the tube down his throat   Family history of breast cancer    Fatty liver    Frequent headaches    GERD (gastroesophageal reflux disease)    History of chicken pox    History of fainting spells of unknown cause    History of prostate cancer    Hyperlipidemia    Hypertension  Myocardial infarction (HCC)    Pneumonia    Prostate cancer ([place])    PTSD (post-traumatic stress disorder)    Sleep apnea    uses Cpap   Allergies  Allergen Reactions   Furosemide Other (See Comments)    unknown   Keflex [Cephalexin]     Hallucinations?   Lisinopril Other (See Comments)    Unknown   Terazosin Other (See Comments)    unknown    Social History   Socioeconomic History   Marital status: Married    Spouse name: Not on file   Number of children: 2   Years of education: Not on file   Highest education level: Not on file  Occupational History   Occupation: retired  Tobacco Use   Smoking status: Former    Pack years: 0.00    Types: Cigarettes    Quit date: 12/14/1975    Years since quitting: 45.5   Smokeless tobacco: Never  Vaping Use   Vaping Use: Not on file  Substance and Sexual Activity   Alcohol use: Not Currently   Drug use: Never   Sexual activity: Not Currently  Other Topics Concern   Not on file  Social History Narrative   ** Merged History Encounter **       Social Determinants of Health   Financial Resource Strain: Not on file  Food Insecurity: Not on file  Transportation Needs: Not on file  Physical Activity: Not on file  Stress: Not on file  Social Connections: Not on file   Vitals:   06/04/21 0955  BP: (!) 150/82  Pulse: 94  Resp: 16  Temp: 98.8 F (37.1 C)  SpO2: 98%   Body mass index is 36.96 kg/m.  Physical Exam Nursing note reviewed.   Constitutional:      General: He is not in acute distress.    Appearance: He is well-developed.  HENT:     Head: Normocephalic and atraumatic.     Mouth/Throat:     Mouth: Mucous membranes are moist.     Pharynx: Oropharynx is clear.  Eyes:     Conjunctiva/sclera: Conjunctivae normal.  Cardiovascular:     Rate and Rhythm: Normal rate and regular rhythm.     Pulses:          Dorsalis pedis pulses are 2+ on the right side and 2+ on the left side.     Heart sounds: No murmur heard. Pulmonary:     Effort: Pulmonary effort is normal. No respiratory distress.     Breath sounds: Normal breath sounds.  Abdominal:     Palpations: Abdomen is soft. There is no hepatomegaly or mass.     Tenderness: There is no abdominal tenderness.  Musculoskeletal:     Right lower leg: 2+ Pitting Edema present.     Left lower leg: 1+ Pitting Edema present.       Legs:       Feet:     Comments: Left great toenail with subungual hematoma, periungual edema and erythema. Hypertrophic toenail  He also has small ecchymosis under 3rd left toe.  Lymphadenopathy:     Cervical: No cervical adenopathy.  Skin:    General: Skin is warm.     Findings: Erythema (Right pretibial area, no induration or tenderness.) and rash (In groin,bilateral. Macular confluent, not affecting crease.) present.  Neurological:     Mental Status: He is alert and oriented to person, place, and time.     Cranial Nerves: No cranial nerve deficit.  Comments: Unstable gait assisted with cane.  Psychiatric:     Comments: Well groomed, good eye contact.   Diabetic Foot Exam - Simple   Simple Foot Form  06/04/2021 12:07 PM  Visual Inspection See comments: Yes Sensation Testing See comments: Yes Pulse Check Posterior Tibialis and Dorsalis pulse intact bilaterally: Yes Comments Hypertrophic toenails,right heel ulcer,and left great toe periungual edema and erythema. Monofilament absent, bilateral.     ASSESSMENT AND PLAN:  Mr.  [name] was seen today for follow-up.  Orders Placed This Encounter  Procedures   Fructosamine   POC HgB A1c   Lab Results  Component Value Date   HGBA1C 4.8 06/04/2021   Type 2 diabetes mellitus with stage 3 chronic kidney disease, with long-term current use of insulin, unspecified whether stage 3a or 3b CKD ([place]) HgA1C on lower normal range, risk for hypoglycemic events. Try no to skin Lantus, instead we need to decrease dose. He agrees with decreasing Lantus from 75 U to 50 U daily. Continue monitoring BS daily, can increase Lantus dose by 5 U in BS's > 150 (fasting), he will keep me informed. Continue appropriate foot and periodic eye exams. I added frustosamine to be done with his next blood work at his oncologist's office.  Paronychia, toe, left Continue Doxycycline. Following with podiatrist. Waiting for cardio clearance to hold on Eliquis.  Intertriginous candidiasis Educated about Dx and prognosis. Nystatin powder and topical Lotrisone recommended. Keep area clean with antibacterial soap.  -     nystatin (MYCOSTATIN/NYSTOP) powder; Apply 1 application topically 3 (three) times daily. -     clotrimazole-betamethasone (LOTRISONE) cream; Apply 1 application topically daily.  Peripheral polyneuropathy Stressed the important of good foot care, checking 2-3 per week for lesions. Keep area of ulcer off contact with any surface, keep it clear with soap and water. Following with podiatrist.  Chronic anticoagulation We discussed side effects of Eliquis, risks holding it for procedures and current guidelines. Explained he can hold on Eliquis 1-2 days before procedure and resume next day. His wife is concerned about possible complications, states that he was told that eliquis can not be stopped suddenly.Will left decision to his cardiologist.  Venous stasis dermatitis of lower extremity Adequate skin care. Compression stockings and LE elevation above heart level a  few times in the afternoon.  Stage 3b chronic kidney disease ([place]) Problem has been stable, e GFR high 57-60, Cr 1.2-1.3. Continue adequate hydration,low salt diet,and avoiding NSAID's/nephrotoxic medications. Adequate BP and glucose controlled. BP elevated today, recommend monitoring BP at home, goal < 140/90.  Coronary artery disease of bypass graft of native heart with stable angina pectoris (HCC) Asymptomatic. He is not longer on Metoprolol or imdur due to orthostatic hypotension. Continue Atorvastatin 80 mg daily. Following with cardiologist.  Major depressive disorder in partial remission, unspecified whether recurrent ([place]) Stable. Continue following with psychiatrist.  Spent 52 minutes with pt.  During this time history was obtained and documented, examination was performed, prior labs/imaging reviewed, and assessment/plan discussed.  Return in about 4 months (around 10/04/2021).   Lailanie Hasley G. [name], MD  [facility]. [place] office.  A few things to remember from today's visit:   Type 2 diabetes mellitus with stage 3 chronic kidney disease, with long-term current use of insulin, unspecified whether stage 3a or 3b CKD ([place]) - Plan: POC HgB A1c  Coronary artery disease of bypass graft of native heart with stable angina pectoris (HCC)  Major depressive disorder in partial remission, unspecified whether  recurrent ([place]), Chronic  Intertriginous candidiasis - Plan: nystatin (MYCOSTATIN/NYSTOP) powder, clotrimazole-betamethasone (LOTRISONE) cream  If you need refills please call your pharmacy. Do not use My Chart to request refills or for acute issues that need immediate attention.   We can decrease Lantus to 50 U daily. Continue monitoring blood sugars and it starts going up, > 150, we can increase Lantus by 5 U at the time.  I think you can stop Eliquis 2 days before procedure and resume 8 hours later if no complications. I will try to contact podiatrist and  oncologist.  Fructosamine to be done with next blood work.  Nystatin powder may work better than corn starch. Lotrisone cream , small amount 1-2 times per day on groin.  Please be sure medication list is accurate. If a new problem present, please set up appointment sooner than planned today.

## 2021-06-04 NOTE — Patient Instructions (Addendum)
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 Do not use My Chart to request refills or for acute issues that need immediate attention.   We can decrease Lantus to 50 U daily. Continue monitoring blood sugars and it starts going up, > 150, we can increase Lantus by 5 U at the time.  I think you can stop Eliquis 2 days before procedure and resume 8 hours later if no complications. I will try to contact podiatrist and oncologist.  Fructosamine to be done with next blood work.  Nystatin powder may work better than corn starch. Lotrisone cream , small amount 1-2 times per day on groin.  Please be sure medication list is accurate. If a new problem present, please set up appointment sooner than planned today.

## 2021-06-07 DIAGNOSIS — C259 Malignant neoplasm of pancreas, unspecified: Secondary | ICD-10-CM | POA: Diagnosis not present

## 2021-06-08 ENCOUNTER — Encounter: Payer: Self-pay | Admitting: Family Medicine

## 2021-06-08 ENCOUNTER — Ambulatory Visit: Payer: Self-pay | Admitting: Surgery

## 2021-06-08 NOTE — H&P (Signed)
History of Present Illness (Bill Esh L. Zenia Resides MD; 06/08/2021 7:47 AM) The patient is a 75 year old male who presents with pancreatic cancer.[name] is a 75 yo male who presents for follow up of pancreatic adenocarcinoma. He has a uT2N0 tumor in the neck of the pancreas and has undergone 6 cycles of neoadjuvant gem/abraxane (last cycle on 6/1). He presents today for follow up after restaging scans, which showed no progression of his primary tumor or evidence of metastatic disease. He reports he had some nausea and decreased appetite during surgery, but this is starting to improve. He is ambulating with the assistance of a walker, which he says he has been using since January. He developed a-fib in March and was started on Eliquis, which he is still taking. He saw a podiatrist last week for an infection of left great toe and is supposed to have surgery for this soon.   Allergies Janeann Forehand, CNA; 06/07/2021 3:41 PM) Furosemide *DIURETICS*   Terazosin HCl *ANTIHYPERTENSIVES*   Lisinopril *ANTIHYPERTENSIVES*   Allergies Reconciled    Medication History Janeann Forehand, CNA; 06/07/2021 3:41 PM) Eliquis  (2.5MG  Tablet, Oral) Active. Vitamin B12  (1000MCG Tablet ER, Oral) Active. Midodrine HCl  (2.5MG  Tablet, Oral) Active. Pantoprazole Sodium  (40MG  Tablet DR, Oral) Active. Metoprolol Succinate ER  (25MG  Tablet ER 24HR, Oral) Active. Gemtesa  (75MG  Tablet, Oral) Active. hydroCHLOROthiazide  (25MG  Tablet, Oral) Active. Isosorbide Mononitrate  (30MG  Tablet ER 24HR, Oral) Active. Nitroglycerin  (0.4MG  Tab Sublingual, Sublingual) Active. Calcium + D  (600-200MG -UNIT Tablet, Oral) Active. Insulin Glargine  (100UNIT/ML Solution, Subcutaneous) Active. traZODone HCl  (100MG  Tablet, Oral) Active. Aspirin  (81MG  Tablet, Oral) Active. Atorvastatin Calcium  (80MG  Tablet, Oral) Active. Tamsulosin HCl  (0.4MG  Capsule, Oral) Active. FLUoxetine HCl  (40MG  Capsule, Oral) Active. Losartan Potassium  (100MG   Tablet, Oral) Active. Medications Reconciled   Vitals (Donyelle Alston CNA; 06/07/2021 3:42 PM) 06/07/2021 3:41 PM Weight: 264.13 lb   Height: 71 in  Body Surface Area: 2.37 m   Body Mass Index: 36.84 kg/m   Temp.: 98.1 F    Pulse: 104 (Regular)    P.OX: 96% (Room air) BP: 140/80(Sitting, Left Arm, Standard)       Physical Exam (Bebe Moncure L. Zenia Resides MD; 06/08/2021 7:48 AM) The physical exam findings are as follows: General: resting comfortably, NAD Neuro: alert and oriented Resp: normal work of breathing on room air. Port in place R upper chest wall, site is clean and dry with no erythema or induration. Abdomen: soft, nondistended, nontender to palpation    Assessment & Plan ([place] L. 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 I have reviewed his restaging scans and original imaging. The known malignancy in the neck of the pancreas is not visible on the newest scan; there is a small cyst in the body/tail. On review of his initial MRCP from January, the pancreatic duct disappears in the neck of the gland, likely at the genu, and is again visible at the junction of the body and tail. I think the head of the gland is likely involved with tumor and thus he will most likely require a Whipple for adequate resection. A distal pancreatectomy would require a subtotal resection and even then may not obtain an adequate margin. I discussed the details of a Whipple with the patient and his wife today, including the risks of bleeding, infection, 15% risk of pancreatic leak, and  delayed gastric emptying. He agrees to proceed with surgery, which will be scheduled in mid to late July to allow him time to recover from chemotherapy. I will discuss holding the next scheduled cycle of chemotherapy in preparation for surgery with Dr. Benay Spice. He received cardiac clearance from his  cardiologist Dr. [name] a few months ago after I first saw him, but we will need to obtain clearance to hold Eliquis for 72 hours prior to surgery.  Michaelle Birks, MD [facility] Surgery General, Hepatobiliary and Pancreatic Surgery 06/08/21 7:56 AM

## 2021-06-08 NOTE — H&P (View-Only) (Signed)
History of Present Illness (Aveion Nguyen L. Zenia Resides MD; 06/08/2021 7:47 AM) The patient is a 75 year old male who presents with pancreatic cancer.Mr. Baby is a 75 yo male who presents for follow up of pancreatic adenocarcinoma. He has a uT2N0 tumor in the neck of the pancreas and has undergone 6 cycles of neoadjuvant gem/abraxane (last cycle on 6/1). He presents today for follow up after restaging scans, which showed no progression of his primary tumor or evidence of metastatic disease. He reports he had some nausea and decreased appetite during surgery, but this is starting to improve. He is ambulating with the assistance of a walker, which he says he has been using since January. He developed a-fib in March and was started on Eliquis, which he is still taking. He saw a podiatrist last week for an infection of left great toe and is supposed to have surgery for this soon.   Allergies Janeann Forehand, CNA; 06/07/2021 3:41 PM) Furosemide *DIURETICS*   Terazosin HCl *ANTIHYPERTENSIVES*   Lisinopril *ANTIHYPERTENSIVES*   Allergies Reconciled    Medication History Janeann Forehand, CNA; 06/07/2021 3:41 PM) Eliquis  (2.5MG  Tablet, Oral) Active. Vitamin B12  (1000MCG Tablet ER, Oral) Active. Midodrine HCl  (2.5MG  Tablet, Oral) Active. Pantoprazole Sodium  (40MG  Tablet DR, Oral) Active. Metoprolol Succinate ER  (25MG  Tablet ER 24HR, Oral) Active. Gemtesa  (75MG  Tablet, Oral) Active. hydroCHLOROthiazide  (25MG  Tablet, Oral) Active. Isosorbide Mononitrate  (30MG  Tablet ER 24HR, Oral) Active. Nitroglycerin  (0.4MG  Tab Sublingual, Sublingual) Active. Calcium + D  (600-200MG -UNIT Tablet, Oral) Active. Insulin Glargine  (100UNIT/ML Solution, Subcutaneous) Active. traZODone HCl  (100MG  Tablet, Oral) Active. Aspirin  (81MG  Tablet, Oral) Active. Atorvastatin Calcium  (80MG  Tablet, Oral) Active. Tamsulosin HCl  (0.4MG  Capsule, Oral) Active. FLUoxetine HCl  (40MG  Capsule, Oral) Active. Losartan Potassium  (100MG   Tablet, Oral) Active. Medications Reconciled   Vitals (Donyelle Alston CNA; 06/07/2021 3:42 PM) 06/07/2021 3:41 PM Weight: 264.13 lb   Height: 71 in  Body Surface Area: 2.37 m   Body Mass Index: 36.84 kg/m   Temp.: 98.1 F    Pulse: 104 (Regular)    P.OX: 96% (Room air) BP: 140/80(Sitting, Left Arm, Standard)       Physical Exam (Saydee Zolman L. Zenia Resides MD; 06/08/2021 7:48 AM) The physical exam findings are as follows: General: resting comfortably, NAD Neuro: alert and oriented Resp: normal work of breathing on room air. Port in place R upper chest wall, site is clean and dry with no erythema or induration. Abdomen: soft, nondistended, nontender to palpation    Assessment & Plan ([place] L. 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 I have reviewed his restaging scans and original imaging. The known malignancy in the neck of the pancreas is not visible on the newest scan; there is a small cyst in the body/tail. On review of his initial MRCP from January, the pancreatic duct disappears in the neck of the gland, likely at the genu, and is again visible at the junction of the body and tail. I think the head of the gland is likely involved with tumor and thus he will most likely require a Whipple for adequate resection. A distal pancreatectomy would require a subtotal resection and even then may not obtain an adequate margin. I discussed the details of a Whipple with the patient and his wife today, including the risks of bleeding, infection, 15% risk of pancreatic leak, and  delayed gastric emptying. He agrees to proceed with surgery, which will be scheduled in mid to late July to allow him time to recover from chemotherapy. I will discuss holding the next scheduled cycle of chemotherapy in preparation for surgery with Dr. Benay Spice. He received cardiac clearance from his  cardiologist Dr. [name] a few months ago after I first saw him, but we will need to obtain clearance to hold Eliquis for 72 hours prior to surgery.  Michaelle Birks, MD [facility] Surgery General, Hepatobiliary and Pancreatic Surgery 06/08/21 7:56 AM

## 2021-06-09 ENCOUNTER — Other Ambulatory Visit: Payer: Self-pay | Admitting: Oncology

## 2021-06-10 ENCOUNTER — Telehealth: Payer: Self-pay | Admitting: *Deleted

## 2021-06-10 NOTE — Telephone Encounter (Signed)
[facility] Pre-operative Risk Assessment    Patient Name: [name]  DOB: 11-Sep-1946  MRN: [id number]    Request for surgical clearance:  What type of surgery is being performed? Staging laparoscopy, whipple, intraoperative ultrasound   When is this surgery scheduled? TBD   What type of clearance is required (medical clearance vs. Pharmacy clearance to hold med vs. Both)? both  Are there any medications that need to be held prior to surgery and how long? Eliquis   Practice name and name of physician performing surgery? [facility] Dr. Freida Busman   What is the office phone number? [phone]   7.   What is the office fax number? [phone] attn: Santiago Glad CMA  8.   Anesthesia type (None, local, MAC, general) ? General   [name] 06/10/2021, 4:26 PM  _________________________________________________________________   (provider comments below)

## 2021-06-11 NOTE — Telephone Encounter (Signed)
Left VM

## 2021-06-11 NOTE — Telephone Encounter (Signed)
   Name: [name]  DOB: December 28, 1945  MRN: [id number]   Primary Cardiologist: Peter [name], MD  Chart reviewed as part of pre-operative protocol coverage. Patient was contacted 06/11/2021 in reference to pre-operative risk assessment for pending surgery as outlined below.  [name] was last seen on 03/25/21 by Sande Rives PA-C.  Since that day, [name] has done well.  Dr. [name] cleared him for surgery in March 2022. Following that visit, he was hospitalized with new onset Afib. At follow up, he was stable. He has had no other changes to his cardiovascular health and denies angina. I think it is reasonable to proceed with surgery with the knowledge that he is at higher risk for cardiovascular complications. He and his wife accept the risks and are willing to proceed with surgery.  Per office protocol, patient can hold Eliquis for 2 days prior to procedure  Therefore, based on ACC/AHA guidelines, the patient would be at acceptable risk for the planned procedure without further cardiovascular testing.   The patient was advised that if he develops new symptoms prior to surgery to contact our office to arrange for a follow-up visit, and he verbalized understanding.  I will route this recommendation to the requesting party via Epic fax function and remove from pre-op pool. Please call with questions.  [place], PA 06/11/2021, 11:24 AM

## 2021-06-11 NOTE — Telephone Encounter (Signed)
Patient with diagnosis of afib on Eliquis for anticoagulation.    Procedure: Staging laparoscopy, whipple, intraoperative ultrasound  Date of procedure: TBD  CHA2DS2-VASc Score = 5  This indicates a 7.2% annual risk of stroke. The patient's score is based upon: CHF History: No HTN History: Yes Diabetes History: Yes Stroke History: No Vascular Disease History: Yes Age Score: 2 Gender Score: 0     CrCl 76 ml/min Platelet count 197  Per office protocol, patient can hold Eliquis for 2 days prior to procedure.

## 2021-06-12 ENCOUNTER — Inpatient Hospital Stay (HOSPITAL_BASED_OUTPATIENT_CLINIC_OR_DEPARTMENT_OTHER): Payer: Medicare Other | Admitting: Oncology

## 2021-06-12 ENCOUNTER — Telehealth: Payer: Self-pay | Admitting: *Deleted

## 2021-06-12 ENCOUNTER — Inpatient Hospital Stay: Payer: Medicare Other

## 2021-06-12 ENCOUNTER — Other Ambulatory Visit: Payer: Self-pay

## 2021-06-12 VITALS — BP 151/79 | HR 86 | Temp 97.8°F | Resp 20 | Ht 71.0 in | Wt 260.4 lb

## 2021-06-12 DIAGNOSIS — C250 Malignant neoplasm of head of pancreas: Secondary | ICD-10-CM

## 2021-06-12 DIAGNOSIS — I10 Essential (primary) hypertension: Secondary | ICD-10-CM | POA: Diagnosis not present

## 2021-06-12 DIAGNOSIS — Z95828 Presence of other vascular implants and grafts: Secondary | ICD-10-CM

## 2021-06-12 DIAGNOSIS — G4730 Sleep apnea, unspecified: Secondary | ICD-10-CM | POA: Diagnosis not present

## 2021-06-12 DIAGNOSIS — Z5111 Encounter for antineoplastic chemotherapy: Secondary | ICD-10-CM | POA: Diagnosis not present

## 2021-06-12 DIAGNOSIS — I2510 Atherosclerotic heart disease of native coronary artery without angina pectoris: Secondary | ICD-10-CM | POA: Diagnosis not present

## 2021-06-12 DIAGNOSIS — C259 Malignant neoplasm of pancreas, unspecified: Secondary | ICD-10-CM | POA: Diagnosis not present

## 2021-06-12 DIAGNOSIS — E119 Type 2 diabetes mellitus without complications: Secondary | ICD-10-CM | POA: Diagnosis not present

## 2021-06-12 LAB — CMP (CANCER CENTER ONLY)
ALT: 14 U/L (ref 0–44)
AST: 19 U/L (ref 15–41)
Albumin: 3.9 g/dL (ref 3.5–5.0)
Alkaline Phosphatase: 69 U/L (ref 38–126)
Anion gap: 8 (ref 5–15)
BUN: 28 mg/dL — ABNORMAL HIGH (ref 8–23)
CO2: 27 mmol/L (ref 22–32)
Calcium: 9.4 mg/dL (ref 8.9–10.3)
Chloride: 104 mmol/L (ref 98–111)
Creatinine: 1.18 mg/dL (ref 0.61–1.24)
GFR, Estimated: >60 mL/min (ref >60)
Glucose, Bld: 159 mg/dL — ABNORMAL HIGH (ref 70–99)
Potassium: 3.6 mmol/L (ref 3.5–5.1)
Sodium: 139 mmol/L (ref 135–145)
Total Bilirubin: 0.7 mg/dL (ref 0.3–1.2)
Total Protein: 6.5 g/dL (ref 6.5–8.1)

## 2021-06-12 LAB — CBC WITH DIFFERENTIAL (CANCER CENTER ONLY)
Abs Immature Granulocytes: 0.02 10*3/uL (ref 0.00–0.07)
Basophils Absolute: 0 10*3/uL (ref 0.0–0.1)
Basophils Relative: 1 %
Eosinophils Absolute: 0 10*3/uL (ref 0.0–0.5)
Eosinophils Relative: 0 %
HCT: 35.3 % — ABNORMAL LOW (ref 39.0–52.0)
Hemoglobin: 11.2 g/dL — ABNORMAL LOW (ref 13.0–17.0)
Immature Granulocytes: 0 %
Lymphocytes Relative: 10 %
Lymphs Abs: 0.6 10*3/uL — ABNORMAL LOW (ref 0.7–4.0)
MCH: 29.6 pg (ref 26.0–34.0)
MCHC: 31.7 g/dL (ref 30.0–36.0)
MCV: 93.1 fL (ref 80.0–100.0)
Monocytes Absolute: 0.5 10*3/uL (ref 0.1–1.0)
Monocytes Relative: 8 %
Neutro Abs: 5.3 10*3/uL (ref 1.7–7.7)
Neutrophils Relative %: 81 %
Platelet Count: 192 10*3/uL (ref 150–400)
RBC: 3.79 MIL/uL — ABNORMAL LOW (ref 4.22–5.81)
RDW: 17.7 % — ABNORMAL HIGH (ref 11.5–15.5)
WBC Count: 6.5 10*3/uL (ref 4.0–10.5)
nRBC: 0 % (ref 0.0–0.2)

## 2021-06-12 MED ORDER — HEPARIN SOD (PORK) LOCK FLUSH 100 UNIT/ML IV SOLN
500.0000 [IU] | Freq: Once | INTRAVENOUS | Status: AC
  Administered 2021-06-12: 500 [IU]
  Filled 2021-06-12: qty 5

## 2021-06-12 NOTE — Progress Notes (Signed)
  [place] OFFICE PROGRESS NOTE   Diagnosis: Pancreas cancer  INTERVAL HISTORY:   [name] missed a scheduled appointment on 05/29/2021.  He feels well.  He has developed an infection and loosening at the left great toenail.  He saw podiatry and is being scheduled for toenail removal.  He saw Dr. Zenia Resides and will be scheduled for pancreas surgery in July.  Objective:  Vital signs in last 24 hours:  Blood pressure (!) 151/79, pulse 86, temperature 97.8 F (36.6 C), temperature source Oral, resp. rate 20, height 5\' 11"  (1.803 m), weight 260 lb 6.4 oz (118.1 kg), SpO2 98 %.    Lymphatics: No cervical, supraclavicular, axillary, or inguinal nodes Resp: Lungs clear bilaterally Cardio: Irregular GI: No mass, nontender, no hepatosplenomegaly Vascular: No leg edema  Skin: The left great toenail is loosened with mild surrounding erythema and hypertrophy of the nail tissue  Portacath/PICC-without erythema  Lab Results:  Lab Results  Component Value Date   WBC 6.5 06/12/2021   HGB 11.2 (L) 06/12/2021   HCT 35.3 (L) 06/12/2021   MCV 93.1 06/12/2021   PLT 192 06/12/2021   NEUTROABS 5.3 06/12/2021    CMP  Lab Results  Component Value Date   NA 139 06/12/2021   K 3.6 06/12/2021   CL 104 06/12/2021   CO2 27 06/12/2021   GLUCOSE 159 (H) 06/12/2021   BUN 28 (H) 06/12/2021   CREATININE 1.18 06/12/2021   CALCIUM 9.4 06/12/2021   PROT 6.5 06/12/2021   ALBUMIN 3.9 06/12/2021   AST 19 06/12/2021   ALT 14 06/12/2021   ALKPHOS 69 06/12/2021   BILITOT 0.7 06/12/2021   GFRNONAA >60 06/12/2021   GFRAA 49 (L) 10/10/2020    Medications: I have reviewed the patient's current medications.   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  No pancreatic ductal dilatation identified.  No definite signs of metastatic disease in the chest, abdomen or pelvis. Cycle 1 gemcitabine/Abraxane 03/01/2021 Cycle 2 gemcitabine/Abraxane 03/14/2021 Cycle 3 gemcitabine/Abraxane 03/28/2021 Cycle 4 gemcitabine/Abraxane 04/17/2021 Cycle 5 gemcitabine/Abraxane 05/01/2021, Zofran/Decadron added Cycle 6 gemcitabine/Abraxane 05/15/2021 CT pancreas protocol 05/27/2021-unchanged 8 mm exophytic low-attenuation lesion at the posterior body of the pancreas, Diabetes Coronary artery disease Prostate cancer 2013-treated with radiation at [facility] Hypertension Sleep apnea 7.  Coronary artery bypass surgery 2004 8.  Hospital admission 03/04/2021-syncope, A. Fib-started on Eliquis 9.  Mild thrombocytopenia following cycle 1 gemcitabine/Abraxane-resolved 10.  Anemia    Disposition: [name] has completed 6 treatments with gemcitabine/Abraxane.  He has been diagnosed with localized pancreas cancer.  Dr. Zenia Resides plans pancreas surgery in July.  She would like to hold further chemotherapy.  He is also being scheduled for toenail removal in the near future.  [name] would like to place chemotherapy on hold for now.  I will see him after surgery to discuss adjuvant treatment options.  Betsy Coder, MD  06/12/2021  3:06 PM

## 2021-06-12 NOTE — Telephone Encounter (Signed)
Yes, patient ok to hold for one day

## 2021-06-12 NOTE — Patient Instructions (Signed)
Implanted Port Home Guide An implanted port is a device that is placed under the skin. It is usually placed in the chest. The device can be used to give IV medicine, to take blood, or for dialysis. You may have an implanted port if: You need IV medicine that would be irritating to the small veins in your hands or arms. You need IV medicines, such as antibiotics, for a long period of time. You need IV nutrition for a long period of time. You need dialysis. When you have a port, your health care provider can choose to use the port instead of veins in your arms for these procedures. You may have fewer limitations when using a port than you would if you used other types of long-term IVs, and you will likely be able to return to normal activities afteryour incision heals. An implanted port has two main parts: Reservoir. The reservoir is the part where a needle is inserted to give medicines or draw blood. The reservoir is round. After it is placed, it appears as a small, raised area under your skin. Catheter. The catheter is a thin, flexible tube that connects the reservoir to a vein. Medicine that is inserted into the reservoir goes into the catheter and then into the vein. How is my port accessed? To access your port: A numbing cream may be placed on the skin over the port site. Your health care provider will put on a mask and sterile gloves. The skin over your port will be cleaned carefully with a germ-killing soap and allowed to dry. Your health care provider will gently pinch the port and insert a needle into it. Your health care provider will check for a blood return to make sure the port is in the vein and is not clogged. If your port needs to remain accessed to get medicine continuously (constant infusion), your health care provider will place a clear bandage (dressing) over the needle site. The dressing and needle will need to be changed every week, or as told by your health care provider. What  is flushing? Flushing helps keep the port from getting clogged. Follow instructions from your health care provider about how and when to flush the port. Ports are usually flushed with saline solution or a medicine called heparin. The need for flushing will depend on how the port is used: If the port is only used from time to time to give medicines or draw blood, the port may need to be flushed: Before and after medicines have been given. Before and after blood has been drawn. As part of routine maintenance. Flushing may be recommended every 4-6 weeks. If a constant infusion is running, the port may not need to be flushed. Throw away any syringes in a disposal container that is meant for sharp items (sharps container). You can buy a sharps container from a pharmacy, or you can make one by using an empty hard plastic bottle with a cover. How long will my port stay implanted? The port can stay in for as long as your health care provider thinks it is needed. When it is time for the port to come out, a surgery will be done to remove it. The surgery will be similar to the procedure that was done to putthe port in. Follow these instructions at home:  Flush your port as told by your health care provider. If you need an infusion over several days, follow instructions from your health care provider about how to take   care of your port site. Make sure you: Wash your hands with soap and water before you change your dressing. If soap and water are not available, use alcohol-based hand sanitizer. Change your dressing as told by your health care provider. Place any used dressings or infusion bags into a plastic bag. Throw that bag in the trash. Keep the dressing that covers the needle clean and dry. Do not get it wet. Do not use scissors or sharp objects near the tube. Keep the tube clamped, unless it is being used. Check your port site every day for signs of infection. Check for: Redness, swelling, or  pain. Fluid or blood. Pus or a bad smell. Protect the skin around the port site. Avoid wearing bra straps that rub or irritate the site. Protect the skin around your port from seat belts. Place a soft pad over your chest if needed. Bathe or shower as told by your health care provider. The site may get wet as long as you are not actively receiving an infusion. Return to your normal activities as told by your health care provider. Ask your health care provider what activities are safe for you. Carry a medical alert card or wear a medical alert bracelet at all times. This will let health care providers know that you have an implanted port in case of an emergency. Get help right away if: You have redness, swelling, or pain at the port site. You have fluid or blood coming from your port site. You have pus or a bad smell coming from the port site. You have a fever. Summary Implanted ports are usually placed in the chest for long-term IV access. Follow instructions from your health care provider about flushing the port and changing bandages (dressings). Take care of the area around your port by avoiding clothing that puts pressure on the area, and by watching for signs of infection. Protect the skin around your port from seat belts. Place a soft pad over your chest if needed. Get help right away if you have a fever or you have redness, swelling, pain, drainage, or a bad smell at the port site. This information is not intended to replace advice given to you by your health care provider. Make sure you discuss any questions you have with your healthcare provider. Document Revised: 04/16/2020 Document Reviewed: 04/16/2020 Elsevier Patient Education  2022 Elsevier Inc.  

## 2021-06-12 NOTE — Telephone Encounter (Signed)
[facility] Pre-operative Risk Assessment    Patient Name: [name]  DOB: Dec 21, 1945  MRN: [id number]     Request for surgical clearance:  What type of surgery is being performed? Removal of toenail   When is this surgery scheduled? TBD   What type of clearance is required (medical clearance vs. Pharmacy clearance to hold med vs. Both)? pharmacy  Are there any medications that need to be held prior to surgery and how long? Eliquis    Practice name and name of physician performing surgery? [facility] Dr. Zachary George   What is the office phone number? [phone]   7.   What is the office fax number? [phone]  8.   Anesthesia type (None, local, MAC, general) ?    [name] [name] 06/12/2021, 10:21 AM  _________________________________________________________________   (provider comments below)

## 2021-06-13 NOTE — Telephone Encounter (Signed)
Name: [name]  DOB: 1946-12-12  MRN: [id number]   Primary Cardiologist: Peter [place], MD  Chart reviewed as part of pre-operative protocol coverage.   Pt may hold eliquis for 1 day prior to nail removal.   I will route this recommendation to the requesting party via Epic fax function and remove from pre-op pool. Please call with questions.  Roe Rutherford Bryndle Corredor, PA 06/13/2021, 9:02 AM

## 2021-06-20 NOTE — Telephone Encounter (Signed)
I tried to call Elmyra Ricks with Dr[facility], though no answer. I will try again later

## 2021-06-20 NOTE — Telephone Encounter (Signed)
Nicole  from [facility] has some questions she is needing to ask concerning this pt's  Pre Op Clearance. Elmyra Ricks can be reached directly at [phone]

## 2021-06-21 NOTE — Telephone Encounter (Signed)
    Patient Name: [name]  DOB: Oct 21, 1946 MRN: [id number]  Primary Cardiologist: Peter [name], MD  Chart reviewed as part of pre-operative protocol coverage.   From a medical clearance standpoint: Fabian Sharp PA-C recently spoke with patient for an unrelated procedural clearance at which time she felt he could proceed without further cardiac testing, so that recommendation still applies here to medically proceed.  From pharmacy clearance standpoint: In the other recent unrelated clearance, he was granted permission to hold Eliquis for 2 days prior to his procedure, there it is OK to extrapolate that recommendation here - patient may hold Eliquis for 2 days prior to this toenail removal if the surgeon deems necessary due to bleeding risk. We typically advise that blood thinners be resumed when felt safe by performing physician.  I will route this updated recommendation to the requesting party via Epic fax function and remove from pre-op pool.  Please call with questions.  Charlie Pitter, PA-C 06/21/2021, 4:13 PM

## 2021-06-21 NOTE — Telephone Encounter (Signed)
Returned call back to [place] from Dr[facility] office.  She advised that Dr. Gershon Mussel wanted pt to hold Eliquis for 48 hours prior and of course the day of, as in his experience, 1 day was not enough.  She is advised that I will route this back to our Preop Team to reassess and they will fax the clearance back once they have looked over.

## 2021-06-24 DIAGNOSIS — Z8546 Personal history of malignant neoplasm of prostate: Secondary | ICD-10-CM | POA: Diagnosis not present

## 2021-06-24 DIAGNOSIS — R300 Dysuria: Secondary | ICD-10-CM | POA: Diagnosis not present

## 2021-06-24 DIAGNOSIS — R3915 Urgency of urination: Secondary | ICD-10-CM | POA: Diagnosis not present

## 2021-06-26 NOTE — Pre-Procedure Instructions (Signed)
Surgical Instructions    Your procedure is scheduled on Monday July 18th .  Report to [facility] Main Entrance "A" at 07:30 A.M., then check in with the Admitting office.  Call this number if you have problems the morning of surgery:  [phone]   If you have any questions prior to your surgery date call [phone]: Open Monday-Friday 8am-4pm    Remember:  Do not eat after midnight the night before your surgery  You may drink clear liquids until 04:30 A.M. the morning of your surgery.   Clear liquids allowed are: Water, Non-Citrus Juices (without pulp), Carbonated Beverages, Clear Tea, Black Coffee Only, and Gatorade    Take these medicines the morning of surgery with A SIP OF WATER   FLUoxetine (PROZAC)  midodrine (PROAMATINE) cycloSPORINE (RESTASIS)- If needed fluticasone (FLONASE)- If needed nitroGLYCERIN (NITROSTAT)- If needed  prochlorperazine (COMPAZINE)- If needed  Please follow your surgeon's instructions on when to stop taking apixaban (ELIQUIS). If you have not received instructions then please contact your surgeon for instructions.    As of today, STOP taking any Aspirin (unless otherwise instructed by your surgeon) Aleve, Naproxen, Ibuprofen, Motrin, Advil, Goody's, BC's, all herbal medications, fish oil, and all vitamins.  WHAT DO I DO ABOUT MY DIABETES MEDICATION?   Do not take oral diabetes medicines (pills) the morning of surgery.     THE MORNING OF SURGERY, DO NOT TAKE insulin glargine (LANTUS)   The day of surgery, do not take other diabetes injectables, including Byetta (exenatide), Bydureon (exenatide ER), Victoza (liraglutide), or Trulicity (dulaglutide).  HOW TO MANAGE YOUR DIABETES BEFORE AND AFTER SURGERY  Why is it important to control my blood sugar before and after surgery? Improving blood sugar levels before and after surgery helps healing and can limit problems. A way of improving blood sugar control is eating a healthy diet by:  Eating  less sugar and carbohydrates  Increasing activity/exercise  Talking with your doctor about reaching your blood sugar goals High blood sugars (greater than 180 mg/dL) can raise your risk of infections and slow your recovery, so you will need to focus on controlling your diabetes during the weeks before surgery. Make sure that the doctor who takes care of your diabetes knows about your planned surgery including the date and location.  How do I manage my blood sugar before surgery? Check your blood sugar at least 4 times a day, starting 2 days before surgery, to make sure that the level is not too high or low.  Check your blood sugar the morning of your surgery when you wake up and every 2 hours until you get to the Short Stay unit.  If your blood sugar is less than 70 mg/dL, you will need to treat for low blood sugar: Do not take insulin. Treat a low blood sugar (less than 70 mg/dL) with  cup of clear juice (cranberry or apple), 4 glucose tablets, OR glucose gel. Recheck blood sugar in 15 minutes after treatment (to make sure it is greater than 70 mg/dL). If your blood sugar is not greater than 70 mg/dL on recheck, call [phone] for further instructions. Report your blood sugar to the short stay nurse when you get to Short Stay.  If you are admitted to the hospital after surgery: Your blood sugar will be checked by the staff and you will probably be given insulin after surgery (instead of oral diabetes medicines) to make sure you have good blood sugar levels. The goal for blood sugar control after  surgery is 80-180 mg/dL.                      Do NOT Smoke (Tobacco/Vaping) or drink Alcohol 24 hours prior to your procedure.  If you use a CPAP at night, you may bring all equipment for your overnight stay.   Contacts, glasses, piercing's, hearing aid's, dentures or partials may not be worn into surgery, please bring cases for these belongings.    For patients admitted to the hospital,  discharge time will be determined by your treatment team.   Patients discharged the day of surgery will not be allowed to drive home, and someone needs to stay with them for 24 hours.  ONLY 1 SUPPORT PERSON MAY BE PRESENT WHILE YOU ARE IN SURGERY. IF YOU ARE TO BE ADMITTED ONCE YOU ARE IN YOUR ROOM YOU WILL BE ALLOWED TWO (2) VISITORS.  Minor children may have two parents present. Special consideration for safety and communication needs will be reviewed on a case by case basis.   Special instructions:   - Preparing For Surgery  Before surgery, you can play an important role. Because skin is not sterile, your skin needs to be as free of germs as possible. You can reduce the number of germs on your skin by washing with CHG (chlorahexidine gluconate) Soap before surgery.  CHG is an antiseptic cleaner which kills germs and bonds with the skin to continue killing germs even after washing.    Oral Hygiene is also important to reduce your risk of infection.  Remember - BRUSH YOUR TEETH THE MORNING OF SURGERY WITH YOUR REGULAR TOOTHPASTE  Please do not use if you have an allergy to CHG or antibacterial soaps. If your skin becomes reddened/irritated stop using the CHG.  Do not shave (including legs and underarms) for at least 48 hours prior to first CHG shower. It is OK to shave your face.  Please follow these instructions carefully.   Shower the NIGHT BEFORE SURGERY and the MORNING OF SURGERY  If you chose to wash your hair, wash your hair first as usual with your normal shampoo.  After you shampoo, rinse your hair and body thoroughly to remove the shampoo.  Use CHG Soap as you would any other liquid soap. You can apply CHG directly to the skin and wash gently with a scrungie or a clean washcloth.   Apply the CHG Soap to your body ONLY FROM THE NECK DOWN.  Do not use on open wounds or open sores. Avoid contact with your eyes, ears, mouth and genitals (private parts). Wash Face and  genitals (private parts)  with your normal soap.   Wash thoroughly, paying special attention to the area where your surgery will be performed.  Thoroughly rinse your body with warm water from the neck down.  DO NOT shower/wash with your normal soap after using and rinsing off the CHG Soap.  Pat yourself dry with a CLEAN TOWEL.  Wear CLEAN PAJAMAS to bed the night before surgery  Place CLEAN SHEETS on your bed the night before your surgery  DO NOT SLEEP WITH PETS.   Day of Surgery: Shower with CHG soap. Do not wear jewelry, make up, nail polish, gel polish, artificial nails, or any other type of covering on natural nails including finger and toenails. If patients have artificial nails, gel coating, etc. that need to be removed by a nail salon please have this removed prior to surgery. Surgery may need to be canceled/delayed  if the surgeon/ anesthesia feels like the patient is unable to be adequately monitored. Do not wear lotions, powders, perfumes/colognes, or deodorant. Do not shave 48 hours prior to surgery.  Men may shave face and neck. Do not bring valuables to the hospital. [facility] is not responsible for any belongings or valuables. Wear Clean/Comfortable clothing the morning of surgery Remember to brush your teeth WITH YOUR REGULAR TOOTHPASTE.   Please read over the following fact sheets that you were given.

## 2021-06-27 ENCOUNTER — Encounter (HOSPITAL_COMMUNITY)
Admission: RE | Admit: 2021-06-27 | Discharge: 2021-06-27 | Disposition: A | Discharge status: Home / Self Care | Payer: Medicare Other | Source: Ambulatory Visit | Attending: Surgery | Admitting: Surgery

## 2021-06-27 ENCOUNTER — Encounter (HOSPITAL_COMMUNITY): Payer: Self-pay

## 2021-06-27 ENCOUNTER — Other Ambulatory Visit: Payer: Self-pay

## 2021-06-27 DIAGNOSIS — Z01812 Encounter for preprocedural laboratory examination: Secondary | ICD-10-CM | POA: Diagnosis not present

## 2021-06-27 DIAGNOSIS — Z20822 Contact with and (suspected) exposure to covid-19: Secondary | ICD-10-CM | POA: Diagnosis not present

## 2021-06-27 HISTORY — DX: Polyneuropathy, unspecified: G62.9

## 2021-06-27 HISTORY — DX: Dyspnea, unspecified: R06.00

## 2021-06-27 LAB — SARS CORONAVIRUS 2 (TAT 6-24 HRS): SARS Coronavirus 2: NEGATIVE

## 2021-06-27 LAB — PREPARE RBC (CROSSMATCH)

## 2021-06-27 LAB — GLUCOSE, CAPILLARY: Glucose-Capillary: 171 mg/dL — ABNORMAL HIGH (ref 70–99)

## 2021-06-27 NOTE — Progress Notes (Signed)
PCP - Albertha Ghee, MD Cardiologist - Peter [name], MD Oncologist- Betsy Coder, MD  PPM/ICD - Denies  Chest x-ray - 03/04/21; 1-view EKG - 03/25/21 Stress Test - 11/20/14 ECHO - 03/04/21 Cardiac Cath - 06/26/17  Sleep Study - Yes, positive for OSA CPAP - Yes  Fasting Blood Sugar - per pt, < 120 Checks Blood Sugar every morning Last A1C 4.8 06/04/21. CBG  at PAT appt was 171, per pt, he already ate breakfast, prior to arrival.  Blood Thinner Instructions: Stop Eliquis 2 Days before sx Aspirin Instructions: N/A  ERAS Protcol - Yes PRE-SURGERY Ensure or G2- Not ordered  COVID TEST- 06/27/21; results pending   Anesthesia review: Yes, cardiac hx; cardia clearance in Epic 06/27/21  Patient denies shortness of breath, fever, cough and chest pain at PAT appointment   All instructions explained to the patient, with a verbal understanding of the material. Patient agrees to go over the instructions while at home for a better understanding. Patient also instructed to self quarantine after being tested for COVID-19. The opportunity to ask questions was provided.

## 2021-06-27 NOTE — Pre-Procedure Instructions (Signed)
Surgical Instructions    Your procedure is scheduled on Monday July 18th .  Report to [facility] Main Entrance "A" at 07:30 A.M., then check in with the Admitting office.  Call this number if you have problems the morning of surgery:  [phone]   If you have any questions prior to your surgery date call [phone]: Open Monday-Friday 8am-4pm    Remember:  Do not eat after midnight the night before your surgery  You may drink clear liquids until 06:30 A.M. the morning of your surgery.   Clear liquids allowed are: Water, Non-Citrus Juices (without pulp), Carbonated Beverages, Clear Tea, Black Coffee Only, and Gatorade.    Take these medicines the morning of surgery with A SIP OF WATER   FLUoxetine (PROZAC)  midodrine (PROAMATINE) cycloSPORINE (RESTASIS)- If needed fluticasone (FLONASE)- If needed nitroGLYCERIN (NITROSTAT)- If needed  prochlorperazine (COMPAZINE)- If needed  Do not take apixaban Arne [place]) Saturday July 16th or Sunday, July 17th   As of today, STOP taking any Aspirin (unless otherwise instructed by your surgeon) Aleve, Naproxen, Ibuprofen, Motrin, Advil, Goody's, BC's, all herbal medications, fish oil, and all vitamins.   WHAT DO I DO ABOUT MY DIABETES MEDICATION?     THE MORNING OF SURGERY, TAKE 30 units of insulin glargine (LANTUS)    HOW TO MANAGE YOUR DIABETES BEFORE AND AFTER SURGERY  Why is it important to control my blood sugar before and after surgery? Improving blood sugar levels before and after surgery helps healing and can limit problems. A way of improving blood sugar control is eating a healthy diet by:  Eating less sugar and carbohydrates  Increasing activity/exercise  Talking with your doctor about reaching your blood sugar goals High blood sugars (greater than 180 mg/dL) can raise your risk of infections and slow your recovery, so you will need to focus on controlling your diabetes during the weeks before surgery. Make sure that the  doctor who takes care of your diabetes knows about your planned surgery including the date and location.  How do I manage my blood sugar before surgery? Check your blood sugar at least 4 times a day, starting 2 days before surgery, to make sure that the level is not too high or low.  Check your blood sugar the morning of your surgery when you wake up and every 2 hours until you get to the Short Stay unit.  If your blood sugar is less than 70 mg/dL, you will need to treat for low blood sugar: Do not take insulin. Treat a low blood sugar (less than 70 mg/dL) with  cup of clear juice (cranberry or apple), 4 glucose tablets, OR glucose gel. Recheck blood sugar in 15 minutes after treatment (to make sure it is greater than 70 mg/dL). If your blood sugar is not greater than 70 mg/dL on recheck, call [phone] for further instructions. Report your blood sugar to the short stay nurse when you get to Short Stay.  If you are admitted to the hospital after surgery: Your blood sugar will be checked by the staff and you will probably be given insulin after surgery (instead of oral diabetes medicines) to make sure you have good blood sugar levels. The goal for blood sugar control after surgery is 80-180 mg/dL.                      Do NOT Smoke (Tobacco/Vaping) or drink Alcohol 24 hours prior to your procedure.  If you use a CPAP at night,  you may bring all equipment for your overnight stay.   Contacts, glasses, piercing's, hearing aid's, dentures or partials may not be worn into surgery, please bring cases for these belongings.    For patients admitted to the hospital, discharge time will be determined by your treatment team.   Patients discharged the day of surgery will not be allowed to drive home, and someone needs to stay with them for 24 hours.  ONLY 1 SUPPORT PERSON MAY BE PRESENT WHILE YOU ARE IN SURGERY. IF YOU ARE TO BE ADMITTED ONCE YOU ARE IN YOUR ROOM YOU WILL BE ALLOWED TWO (2)  VISITORS.  Minor children may have two parents present. Special consideration for safety and communication needs will be reviewed on a case by case basis.   Special instructions:   [place]- Preparing For Surgery  Before surgery, you can play an important role. Because skin is not sterile, your skin needs to be as free of germs as possible. You can reduce the number of germs on your skin by washing with CHG (chlorahexidine gluconate) Soap before surgery.  CHG is an antiseptic cleaner which kills germs and bonds with the skin to continue killing germs even after washing.    Oral Hygiene is also important to reduce your risk of infection.  Remember - BRUSH YOUR TEETH THE MORNING OF SURGERY WITH YOUR REGULAR TOOTHPASTE  Please do not use if you have an allergy to CHG or antibacterial soaps. If your skin becomes reddened/irritated stop using the CHG.  Do not shave (including legs and underarms) for at least 48 hours prior to first CHG shower. It is OK to shave your face.  Please follow these instructions carefully.   Shower the NIGHT BEFORE SURGERY and the MORNING OF SURGERY  If you chose to wash your hair, wash your hair first as usual with your normal shampoo.  After you shampoo, rinse your hair and body thoroughly to remove the shampoo.  Use CHG Soap as you would any other liquid soap. You can apply CHG directly to the skin and wash gently with a scrungie or a clean washcloth.   Apply the CHG Soap to your body ONLY FROM THE NECK DOWN.  Do not use on open wounds or open sores. Avoid contact with your eyes, ears, mouth and genitals (private parts). Wash Face and genitals (private parts)  with your normal soap.   Wash thoroughly, paying special attention to the area where your surgery will be performed.  Thoroughly rinse your body with warm water from the neck down.  DO NOT shower/wash with your normal soap after using and rinsing off the CHG Soap.  Pat yourself dry with a CLEAN  TOWEL.  Wear CLEAN PAJAMAS to bed the night before surgery  Place CLEAN SHEETS on your bed the night before your surgery  DO NOT SLEEP WITH PETS.   Day of Surgery: Shower with CHG soap. Do not wear jewelry. Do not wear lotions, powders, perfumes/colognes, or deodorant. Do not shave 48 hours prior to surgery.  Men may shave face and neck. Do not bring valuables to the hospital. [facility] is not responsible for any belongings or valuables. Wear Clean/Comfortable clothing the morning of surgery Remember to brush your teeth WITH YOUR REGULAR TOOTHPASTE.   Please read over the following fact sheets that you were given.

## 2021-06-28 ENCOUNTER — Encounter (HOSPITAL_COMMUNITY): Payer: Self-pay

## 2021-06-28 NOTE — Progress Notes (Signed)
Anesthesia Chart Review:  Case: [id number] Date/Time: 07/01/21 0915   Procedures:      STAGING LAPAROSCOPY     WHIPPLE PROCEDURE WITH INTRAOPERATIVE ULTRASOUND   Anesthesia type: General   Pre-op diagnosis: pancreatic adenocarcinoma   Location: [facility] OR ROOM 02 / [place] OR   Surgeons: Dwan Bolt, MD       DISCUSSION:  Patient is a 75 year old [name] scheduled for the above procedure. Pancreatic adenocarcinoma diagnosed on FNA biopsy 01/15/21. S/p right IJ Port-a-cath 02/21/21.    History includes former smoker (quit 12/14/75), CAD (MI, s/p CABG 2004 [facility]; 06/26/17 LHC: patent grafts LIMA-LAD, SVG-RCA, SVG-RI with 40-50% stenosis), afib (03/04/21), HTN, DM2, HLD, fainting spells (last 03/04/21, admitted for afib, orthostatic hypotension, possible RUE cellulitis, s/p antibiotics), PTSD, agent orange exposure, fatty liver, pancreatic cancer (diagnosed 01/15/21, started chemo 03/01/21 x6 treatments, last 05/24/21), OSA (uses CPAP), GERD, anemia, prostate cancer (s/p radiation ~ 2013), CKD/microscopic hematuria, frequent headaches, DIFFICULT AIRWAY (reported "during CABG was told it was hard to get the tube down his throat"). BMI is consistent with obesity.   Last cardiology evaluation 03/25/21 by Sande Rives, PA-C following hospitalization for orthostatic hypotension and new onset afib. No PE/DVT by imaging. He was started on Eliquis and midodrine. Echo showed normal LVF. EKG at visit showed SR. Preoperative cardiology input outlined on 06/11/21 by Dyann Kief, PA, "Rafael Bihari Coggeshall was last seen on 03/25/21 by Sande Rives PA-C.  Since that day, Yuriy Cui has done well.  Dr. [name] cleared him for surgery in March 2022. Following that visit, he was hospitalized with new onset Afib. At follow up, he was stable. He has had no other changes to his cardiovascular health and denies angina. I think it is reasonable to proceed with surgery with the knowledge that he is at higher risk for  cardiovascular complications. He and his wife accept the risks and are willing to proceed with surgery.   Per office protocol, patient can hold Eliquis for 2 days prior to procedure   Therefore, based on ACC/AHA guidelines, the patient would be at acceptable risk for the planned procedure without further cardiovascular testing..." There is also subsequent cardiology input regarding upcoming He is also for left toe nail removal for infection by podiatrist Ralph Dowdy, DPM.    Of note, I communicated with Dr. Zenia Resides regarding if she was planning to ask anesthesiologist to place an epidural for pain management. She replied that she did not intend to due to concerns of hypotension.  He already has issues with orthostatic hypotension and is on midodrine.  06/27/21 presurgical COVID-19 test negative.  Anesthesia team to evaluate on the day of surgery. Anesthesia records are not readily available from his 2004 CABG and reported difficult airway.    VS: BP 114/67   Pulse 83   Temp 36.6 C (Oral)   Resp 20   Ht 5\' 11"  (1.803 m)   Wt 117.5 kg   SpO2 96%   BMI 36.14 kg/m   PROVIDERS: [name], [name] G, MD is PCP [name], Peter, MD is cardiologist Ladell Pier, MD is HEM-[facility]C Kathie Rhodes, MD is urologist   LABS: He had labs on 06/12/21 and T&S on 06/27/21. Results include: Lab Results  Component Value Date   WBC 6.5 06/12/2021   HGB 11.2 (L) 06/12/2021   HCT 35.3 (L) 06/12/2021   PLT 192 06/12/2021   GLUCOSE 159 (H) 06/12/2021   ALT 14 06/12/2021   AST 19 06/12/2021   NA 139 06/12/2021  K 3.6 06/12/2021   CL 104 06/12/2021   CREATININE 1.18 06/12/2021   BUN 28 (H) 06/12/2021   CO2 27 06/12/2021   TSH 1.389 03/05/2021   INR 1.1 (H) 01/24/2021   HGBA1C 4.8 06/04/2021     IMAGES: CTA Chest 03/05/21: IMPRESSION: 1. No evidence of acute pulmonary embolus. 2. Stable chest from last month.  No acute or metastatic findings. 3. Aortic Atherosclerosis (ICD10-I70.0).  CT  Head 03/04/21: IMPRESSION: Moderate for age cerebral white matter changes most commonly due to small vessel disease. No acute or metastatic intracranial abnormality identified on this noncontrast exam.  CT Chest/abd/pelvis 01/29/21: IMPRESSION: 1. No pancreatic mass. No pancreatic ductal dilatation confidently identified on today's CT examination. 2. No definite signs of metastatic disease in the chest, abdomen or pelvis. 3. Median lobe hypertrophy in the prostate gland. Prostate gland is otherwise grossly unremarkable in appearance. 4. Aortic atherosclerosis, in addition to left main and 3 vessel coronary artery disease. Status post median sternotomy for CABG including LIMA to the LAD. 5. Additional incidental findings, as above. [see full report]   MRI Abdomen 12/20/20: IMPRESSION: 1. Loss of continuity of the pancreatic duct in the mid pancreatic body with mild upstream duct dilatation. No discrete lesion identified however cannot exclude an obstructing lesion. There is a small cystic lesion adjacent the duct dilatation which could indicate a intraductal papillary mucinous tumor. Cannot exclude a more aggressive obstructive neoplasm (adenocarcinoma). Recommend GI consultation, correlation with tumor markers (CA 19 9) and consider endoscopic ultrasound for further evaluation. At minimum, recommend follow-up MRI pancreatic protocol without contrast in 1-3 months. 2. 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 3. Postcholecystectomy.     EKG: 03/25/21: Normal sinus rhythm Cannot rule out anterior infarct, age undetermined     CV: RUE Venous US 03/06/21: Right:  No evidence of deep vein thrombosis in the upper extremity. No evidence of  superficial vein thrombosis in the upper extremity. No evidence of  thrombosis in  the subclavian.    LE Venous US 03/05/21: Summary:  RIGHT:  - There is no evidence of deep vein thrombosis in the lower extremity.  -  No cystic structure found in the popliteal fossa.  LEFT:  - No evidence of common femoral vein obstruction.      Echo 03/04/21: IMPRESSIONS   1. Left ventricular ejection fraction, by estimation, is 60 to 65%. The  left ventricle has normal function. The left ventricle has no regional  wall motion abnormalities. Left ventricular diastolic parameters are  indeterminate.   2. Right ventricular systolic function is normal. The right ventricular  size is normal. There is normal pulmonary artery systolic pressure.   3. The mitral valve is normal in structure. No evidence of mitral valve  regurgitation.   4. The aortic valve is normal in structure. Aortic valve regurgitation is  not visualized.   5. The inferior vena cava is normal in size with greater than 50%  respiratory variability, suggesting right atrial pressure of 3 mmHg.   Cardiac cath 06/26/17 [facility]): Conclusions:   1. Three-vessel coronary artery disease with patent LIMA graft to the  LAD and vein graft to the RCA, 40-50% occlusion in the proximal segment of  the vein graft to the ramus.   2. Overall normal left ventricular systolic function but with minimal  inferior hypokinesis, ejection fraction 60%.   3. Overall normal left ventricular diastolic function, LVEDP 18 mmHg.  Plan:   Continue with risk factor modification.  Past Medical History:  Diagnosis Date   Anemia    Arthritis    Cancer ([place])    prostate   Chronic kidney disease    blood in urine    Constipation    Coronary artery disease    Depression    Diabetes mellitus without complication ([place])    Difficult intubation    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 it was hard to get the tube down his throat   Dyspnea    Family history of breast cancer    Fatty liver    Fatty liver    Frequent headaches    GERD (gastroesophageal reflux disease)    History of chicken pox    History of fainting spells of unknown cause    History of prostate cancer    Hyperlipidemia     Hypertension    Myocardial infarction [facility])    Peripheral neuropathy    Pneumonia    Prostate cancer ([place])    PTSD (post-traumatic stress disorder)    Sleep apnea    uses Cpap    Past Surgical History:  Procedure Laterality Date   APPENDECTOMY     BIOPSY  01/15/2021   Procedure: BIOPSY;  Surgeon: Irving Copas., MD;  Location: [place];  Service: Gastroenterology;;   CARDIAC CATHETERIZATION  06/26/2017   CARDIAC SURGERY     Triple Bypass   CHOLECYSTECTOMY  2010   COLONOSCOPY     CORONARY ARTERY BYPASS GRAFT  2004   ESOPHAGOGASTRODUODENOSCOPY (EGD) WITH PROPOFOL N/A 01/15/2021   Procedure: ESOPHAGOGASTRODUODENOSCOPY (EGD) WITH PROPOFOL;  Surgeon: Irving Copas., MD;  Location: [facility] ENDOSCOPY;  Service: Gastroenterology;  Laterality: N/A;   EUS N/A 01/15/2021   Procedure: UPPER ENDOSCOPIC ULTRASOUND (EUS) RADIAL;  Surgeon: Irving Copas., MD;  Location: [place];  Service: Gastroenterology;  Laterality: N/A;   EYE SURGERY Bilateral    cataract removal   FINE NEEDLE ASPIRATION  01/15/2021   Procedure: FINE NEEDLE ASPIRATION (FNA) LINEAR;  Surgeon: Irving Copas., MD;  Location: [place];  Service: Gastroenterology;;   PORTACATH PLACEMENT Right 02/21/2021   Procedure: INSERTION PORT-A-CATH;  Surgeon: Dwan Bolt, MD;  Location: [place];  Service: General;  Laterality: Right;   TONSILLECTOMY  1958    MEDICATIONS:  Meth-Hyo-M Bl-Na Phos-Ph Sal (URIBEL PO)   apixaban (ELIQUIS) 5 MG TABS tablet   atorvastatin (LIPITOR) 80 MG tablet   calcium-vitamin D (OSCAL WITH D) 500-200 MG-UNIT tablet   clotrimazole-betamethasone (LOTRISONE) cream   cycloSPORINE (RESTASIS) 0.05 % ophthalmic emulsion   doxycycline (VIBRA-TABS) 100 MG tablet   ferrous sulfate 325 (65 FE) MG tablet   FLUoxetine (PROZAC) 20 MG capsule   fluticasone (FLONASE) 50 MCG/ACT nasal spray   insulin glargine (LANTUS) 100 UNIT/ML injection   lidocaine-prilocaine (EMLA)  cream   midodrine (PROAMATINE) 5 MG tablet   Multiple Vitamin (MULTI-VITAMINS) TABS   nitroGLYCERIN (NITROSTAT) 0.4 MG SL tablet   nystatin (MYCOSTATIN/NYSTOP) powder   pantoprazole (PROTONIX) 40 MG tablet   prochlorperazine (COMPAZINE) 10 MG tablet   tamsulosin (FLOMAX) 0.4 MG CAPS capsule   traZODone (DESYREL) 100 MG tablet   vitamin B-12 (CYANOCOBALAMIN) 500 MCG tablet   Wheat Dextrin (BENEFIBER PO)   No current facility-administered medications for this encounter.    Myra Gianotti, PA-C Surgical Short Stay/Anesthesiology [facility] Phone [phone] [facility] Phone [phone] 06/28/2021 10:00 AM

## 2021-06-28 NOTE — Anesthesia Preprocedure Evaluation (Addendum)
Anesthesia Evaluation  Patient identified by MRN, date of birth, ID band Patient awake    Reviewed: Allergy & Precautions, H&P , NPO status , Patient's Chart, lab work & pertinent test results  History of Anesthesia Complications (+) DIFFICULT AIRWAY  Airway Mallampati: III  TM Distance: >3 FB Neck ROM: Full    Dental no notable dental hx. (+) Teeth Intact, Dental Advisory Given   Pulmonary sleep apnea , former smoker,    Pulmonary exam normal breath sounds clear to auscultation       Cardiovascular hypertension, + CAD, + Past MI and + CABG   Rhythm:Regular Rate:Normal     Neuro/Psych  Headaches, Anxiety Depression    GI/Hepatic Neg liver ROS, GERD  ,  Endo/Other  diabetesMorbid obesity  Renal/GU Renal disease  negative genitourinary   Musculoskeletal  (+) Arthritis , Osteoarthritis,    Abdominal   Peds  Hematology  (+) Blood dyscrasia, anemia ,   Anesthesia Other Findings   Reproductive/Obstetrics negative OB ROS                           Anesthesia Physical Anesthesia Plan  ASA: 3  Anesthesia Plan: General   Post-op Pain Management:    Induction: Intravenous  PONV Risk Score and Plan: 3 and Ondansetron, Dexamethasone and Midazolam  Airway Management Planned: Oral ETT  Additional Equipment: Arterial line  Intra-op Plan:   Post-operative Plan: Extubation in OR and Possible Post-op intubation/ventilation  Informed Consent: I have reviewed the patients History and Physical, chart, labs and discussed the procedure including the risks, benefits and alternatives for the proposed anesthesia with the patient or authorized representative who has indicated his/her understanding and acceptance.     Dental advisory given  Plan Discussed with: CRNA  Anesthesia Plan Comments: (PAT note written 06/28/2021 by Myra Gianotti, PA-C. I communicated with Dr. Zenia Resides, she does not plan for  epidural given hypotension concerns.  History of difficult airway in 2004 at [facility], records not readily available. )      Anesthesia Quick Evaluation

## 2021-07-01 ENCOUNTER — Encounter (HOSPITAL_COMMUNITY)
Admission: RE | Disposition: A | Discharge status: Home Health | Payer: Self-pay | Source: Home / Self Care | Attending: Surgery

## 2021-07-01 ENCOUNTER — Inpatient Hospital Stay (HOSPITAL_COMMUNITY): Payer: Medicare Other | Admitting: Physician Assistant

## 2021-07-01 ENCOUNTER — Inpatient Hospital Stay (HOSPITAL_COMMUNITY)
Admission: RE | Admit: 2021-07-01 | Discharge: 2021-07-09 | DRG: 406 | Disposition: A | Discharge status: Home Health | Payer: Medicare Other | Attending: Surgery | Admitting: Surgery

## 2021-07-01 ENCOUNTER — Other Ambulatory Visit: Payer: Self-pay

## 2021-07-01 ENCOUNTER — Encounter (HOSPITAL_COMMUNITY): Payer: Self-pay | Admitting: Surgery

## 2021-07-01 ENCOUNTER — Inpatient Hospital Stay (HOSPITAL_COMMUNITY): Payer: Medicare Other | Admitting: Anesthesiology

## 2021-07-01 DIAGNOSIS — I4891 Unspecified atrial fibrillation: Secondary | ICD-10-CM | POA: Diagnosis not present

## 2021-07-01 DIAGNOSIS — Z7982 Long term (current) use of aspirin: Secondary | ICD-10-CM | POA: Diagnosis not present

## 2021-07-01 DIAGNOSIS — E119 Type 2 diabetes mellitus without complications: Secondary | ICD-10-CM | POA: Diagnosis not present

## 2021-07-01 DIAGNOSIS — N179 Acute kidney failure, unspecified: Secondary | ICD-10-CM | POA: Diagnosis not present

## 2021-07-01 DIAGNOSIS — Z79899 Other long term (current) drug therapy: Secondary | ICD-10-CM | POA: Diagnosis not present

## 2021-07-01 DIAGNOSIS — Z794 Long term (current) use of insulin: Secondary | ICD-10-CM

## 2021-07-01 DIAGNOSIS — Z23 Encounter for immunization: Secondary | ICD-10-CM

## 2021-07-01 DIAGNOSIS — I10 Essential (primary) hypertension: Secondary | ICD-10-CM | POA: Diagnosis present

## 2021-07-01 DIAGNOSIS — Z7901 Long term (current) use of anticoagulants: Secondary | ICD-10-CM | POA: Diagnosis not present

## 2021-07-01 DIAGNOSIS — C250 Malignant neoplasm of head of pancreas: Secondary | ICD-10-CM | POA: Diagnosis not present

## 2021-07-01 DIAGNOSIS — I2510 Atherosclerotic heart disease of native coronary artery without angina pectoris: Secondary | ICD-10-CM | POA: Diagnosis present

## 2021-07-01 DIAGNOSIS — Z6835 Body mass index (BMI) 35.0-35.9, adult: Secondary | ICD-10-CM | POA: Diagnosis not present

## 2021-07-01 DIAGNOSIS — R110 Nausea: Secondary | ICD-10-CM | POA: Diagnosis not present

## 2021-07-01 DIAGNOSIS — I252 Old myocardial infarction: Secondary | ICD-10-CM | POA: Diagnosis not present

## 2021-07-01 DIAGNOSIS — E440 Moderate protein-calorie malnutrition: Secondary | ICD-10-CM | POA: Diagnosis present

## 2021-07-01 DIAGNOSIS — Z951 Presence of aortocoronary bypass graft: Secondary | ICD-10-CM

## 2021-07-01 DIAGNOSIS — Z87891 Personal history of nicotine dependence: Secondary | ICD-10-CM

## 2021-07-01 DIAGNOSIS — C251 Malignant neoplasm of body of pancreas: Secondary | ICD-10-CM | POA: Diagnosis present

## 2021-07-01 DIAGNOSIS — Z888 Allergy status to other drugs, medicaments and biological substances status: Secondary | ICD-10-CM

## 2021-07-01 DIAGNOSIS — E1129 Type 2 diabetes mellitus with other diabetic kidney complication: Secondary | ICD-10-CM | POA: Diagnosis present

## 2021-07-01 DIAGNOSIS — Z9221 Personal history of antineoplastic chemotherapy: Secondary | ICD-10-CM | POA: Diagnosis not present

## 2021-07-01 DIAGNOSIS — C259 Malignant neoplasm of pancreas, unspecified: Secondary | ICD-10-CM | POA: Diagnosis not present

## 2021-07-01 DIAGNOSIS — G4730 Sleep apnea, unspecified: Secondary | ICD-10-CM | POA: Diagnosis present

## 2021-07-01 DIAGNOSIS — N1832 Chronic kidney disease, stage 3b: Secondary | ICD-10-CM

## 2021-07-01 DIAGNOSIS — R451 Restlessness and agitation: Secondary | ICD-10-CM | POA: Diagnosis not present

## 2021-07-01 DIAGNOSIS — R55 Syncope and collapse: Secondary | ICD-10-CM | POA: Diagnosis not present

## 2021-07-01 DIAGNOSIS — N183 Chronic kidney disease, stage 3 unspecified: Secondary | ICD-10-CM | POA: Diagnosis not present

## 2021-07-01 DIAGNOSIS — E1122 Type 2 diabetes mellitus with diabetic chronic kidney disease: Secondary | ICD-10-CM | POA: Diagnosis not present

## 2021-07-01 DIAGNOSIS — I1310 Hypertensive heart and chronic kidney disease without heart failure, with stage 1 through stage 4 chronic kidney disease, or unspecified chronic kidney disease: Secondary | ICD-10-CM | POA: Diagnosis not present

## 2021-07-01 HISTORY — PX: LAPAROSCOPY: SHX197

## 2021-07-01 HISTORY — PX: SPLENECTOMY, TOTAL: SHX788

## 2021-07-01 LAB — POCT I-STAT 7, (LYTES, BLD GAS, ICA,H+H)
Acid-base deficit: 2 mmol/L (ref 0.0–2.0)
Bicarbonate: 24.3 mmol/L (ref 20.0–28.0)
Calcium, Ion: 1.15 mmol/L (ref 1.15–1.40)
HCT: 26 % — ABNORMAL LOW (ref 39.0–52.0)
Hemoglobin: 8.8 g/dL — ABNORMAL LOW (ref 13.0–17.0)
O2 Saturation: 99 %
Potassium: 3.6 mmol/L (ref 3.5–5.1)
Sodium: 141 mmol/L (ref 135–145)
TCO2: 26 mmol/L (ref 22–32)
pCO2 arterial: 46.4 mmHg (ref 32.0–48.0)
pH, Arterial: 7.327 — ABNORMAL LOW (ref 7.350–7.450)
pO2, Arterial: 180 mmHg — ABNORMAL HIGH (ref 83.0–108.0)

## 2021-07-01 LAB — MRSA NEXT GEN BY PCR, NASAL: MRSA by PCR Next Gen: NOT DETECTED

## 2021-07-01 LAB — GLUCOSE, CAPILLARY
Glucose-Capillary: 95 mg/dL (ref 70–99)
Glucose-Capillary: 161 mg/dL — ABNORMAL HIGH (ref 70–99)
Glucose-Capillary: 154 mg/dL — ABNORMAL HIGH (ref 70–99)
Glucose-Capillary: 153 mg/dL — ABNORMAL HIGH (ref 70–99)

## 2021-07-01 LAB — COMPREHENSIVE METABOLIC PANEL
ALT: 23 U/L (ref 0–44)
AST: 29 U/L (ref 15–41)
Albumin: 3.1 g/dL — ABNORMAL LOW (ref 3.5–5.0)
Alkaline Phosphatase: 42 U/L (ref 38–126)
Anion gap: 9 (ref 5–15)
BUN: 19 mg/dL (ref 8–23)
CO2: 22 mmol/L (ref 22–32)
Calcium: 7.9 mg/dL — ABNORMAL LOW (ref 8.9–10.3)
Chloride: 105 mmol/L (ref 98–111)
Creatinine, Ser: 1.19 mg/dL (ref 0.61–1.24)
GFR, Estimated: >60 mL/min (ref >60)
Glucose, Bld: 172 mg/dL — ABNORMAL HIGH (ref 70–99)
Potassium: 3.9 mmol/L (ref 3.5–5.1)
Sodium: 136 mmol/L (ref 135–145)
Total Bilirubin: 0.9 mg/dL (ref 0.3–1.2)
Total Protein: 5 g/dL — ABNORMAL LOW (ref 6.5–8.1)

## 2021-07-01 LAB — CBC
HCT: 31.1 % — ABNORMAL LOW (ref 39.0–52.0)
Hemoglobin: 10.1 g/dL — ABNORMAL LOW (ref 13.0–17.0)
MCH: 29.8 pg (ref 26.0–34.0)
MCHC: 32.5 g/dL (ref 30.0–36.0)
MCV: 91.7 fL (ref 80.0–100.0)
Platelets: 107 10*3/uL — ABNORMAL LOW (ref 150–400)
RBC: 3.39 MIL/uL — ABNORMAL LOW (ref 4.22–5.81)
RDW: 16.2 % — ABNORMAL HIGH (ref 11.5–15.5)
WBC: 11.3 10*3/uL — ABNORMAL HIGH (ref 4.0–10.5)
nRBC: 0 % (ref 0.0–0.2)

## 2021-07-01 LAB — PREPARE RBC (CROSSMATCH)

## 2021-07-01 LAB — ABO/RH: ABO/RH(D): O POS

## 2021-07-01 SURGERY — LAPAROSCOPY, DIAGNOSTIC
Anesthesia: General | Site: Abdomen

## 2021-07-01 MED ORDER — METRONIDAZOLE 500 MG/100ML IV SOLN
INTRAVENOUS | Status: AC
  Filled 2021-07-01: qty 100

## 2021-07-01 MED ORDER — HEMOSTATIC AGENTS (NO CHARGE) OPTIME
TOPICAL | Status: DC | PRN
Start: 2021-07-01 — End: 2021-07-01
  Administered 2021-07-01 (×2): 1 via TOPICAL

## 2021-07-01 MED ORDER — PHENYLEPHRINE HCL-NACL 10-0.9 MG/250ML-% IV SOLN
INTRAVENOUS | Status: DC | PRN
  Administered 2021-07-01: 30 ug/min via INTRAVENOUS

## 2021-07-01 MED ORDER — ATORVASTATIN CALCIUM 40 MG PO TABS
40.0000 mg | ORAL_TABLET | Freq: Every day | ORAL | Status: DC
  Administered 2021-07-02 – 2021-07-08 (×7): 40 mg via ORAL
  Filled 2021-07-01 (×7): qty 1

## 2021-07-01 MED ORDER — MIDAZOLAM HCL 2 MG/2ML IJ SOLN
INTRAMUSCULAR | Status: DC | PRN
  Administered 2021-07-01: 1 mg via INTRAVENOUS

## 2021-07-01 MED ORDER — FENTANYL CITRATE (PF) 250 MCG/5ML IJ SOLN
INTRAMUSCULAR | Status: AC
  Filled 2021-07-01: qty 5

## 2021-07-01 MED ORDER — INSULIN ASPART 100 UNIT/ML IJ SOLN
0.0000 [IU] | INTRAMUSCULAR | Status: DC
  Administered 2021-07-01 – 2021-07-02 (×4): 3 [IU] via SUBCUTANEOUS
  Administered 2021-07-02: 6 [IU] via SUBCUTANEOUS
  Administered 2021-07-02 – 2021-07-03 (×7): 3 [IU] via SUBCUTANEOUS
  Administered 2021-07-03 – 2021-07-04 (×3): 2 [IU] via SUBCUTANEOUS
  Administered 2021-07-04: 3 [IU] via SUBCUTANEOUS
  Administered 2021-07-05: 5 [IU] via SUBCUTANEOUS

## 2021-07-01 MED ORDER — TAMSULOSIN HCL 0.4 MG PO CAPS: 0.8000 mg | ORAL_CAPSULE | Freq: Every day | ORAL | Status: DC

## 2021-07-01 MED ORDER — PHENYLEPHRINE HCL-NACL 10-0.9 MG/250ML-% IV SOLN
0.0000 ug/min | INTRAVENOUS | Status: DC
  Administered 2021-07-01: 20 ug/min via INTRAVENOUS

## 2021-07-01 MED ORDER — LIDOCAINE 2% (20 MG/ML) 5 ML SYRINGE
INTRAMUSCULAR | Status: DC | PRN
  Administered 2021-07-01: 60 mg via INTRAVENOUS

## 2021-07-01 MED ORDER — KETAMINE HCL 50 MG/5ML IJ SOSY
PREFILLED_SYRINGE | INTRAMUSCULAR | Status: AC
  Filled 2021-07-01: qty 5

## 2021-07-01 MED ORDER — ORAL CARE MOUTH RINSE
15.0000 mL | Freq: Two times a day (BID) | OROMUCOSAL | Status: DC
  Administered 2021-07-01 – 2021-07-05 (×9): 15 mL via OROMUCOSAL

## 2021-07-01 MED ORDER — FENTANYL CITRATE (PF) 250 MCG/5ML IJ SOLN
INTRAMUSCULAR | Status: DC | PRN
  Administered 2021-07-01 (×7): 50 ug via INTRAVENOUS

## 2021-07-01 MED ORDER — EPHEDRINE SULFATE-NACL 50-0.9 MG/10ML-% IV SOSY
PREFILLED_SYRINGE | INTRAVENOUS | Status: DC | PRN
  Administered 2021-07-01 (×4): 5 mg via INTRAVENOUS

## 2021-07-01 MED ORDER — INSULIN GLARGINE 100 UNIT/ML ~~LOC~~ SOLN
30.0000 [IU] | Freq: Every day | SUBCUTANEOUS | Status: DC
  Administered 2021-07-02 – 2021-07-09 (×7): 30 [IU] via SUBCUTANEOUS
  Filled 2021-07-01 (×8): qty 0.3

## 2021-07-01 MED ORDER — ACETAMINOPHEN 10 MG/ML IV SOLN
INTRAVENOUS | Status: DC | PRN
  Administered 2021-07-01: 1000 mg via INTRAVENOUS

## 2021-07-01 MED ORDER — CIPROFLOXACIN IN D5W 400 MG/200ML IV SOLN
400.0000 mg | INTRAVENOUS | Status: AC
  Administered 2021-07-01: 400 mg via INTRAVENOUS

## 2021-07-01 MED ORDER — DEXAMETHASONE SODIUM PHOSPHATE 10 MG/ML IJ SOLN
INTRAMUSCULAR | Status: DC | PRN
  Administered 2021-07-01: 5 mg via INTRAVENOUS

## 2021-07-01 MED ORDER — LACTATED RINGERS IV SOLN: INTRAVENOUS | Status: DC

## 2021-07-01 MED ORDER — SODIUM CHLORIDE 0.9 % IV SOLN: 10.0000 mL/h | Freq: Once | INTRAVENOUS | Status: AC

## 2021-07-01 MED ORDER — CHLORHEXIDINE GLUCONATE 0.12 % MT SOLN: 15.0000 mL | Freq: Once | OROMUCOSAL | Status: AC

## 2021-07-01 MED ORDER — LIDOCAINE 2% (20 MG/ML) 5 ML SYRINGE
INTRAMUSCULAR | Status: AC
  Filled 2021-07-01: qty 5

## 2021-07-01 MED ORDER — ONDANSETRON HCL 4 MG/2ML IJ SOLN
INTRAMUSCULAR | Status: AC
  Filled 2021-07-01: qty 2

## 2021-07-01 MED ORDER — ONDANSETRON HCL 4 MG/2ML IJ SOLN
4.0000 mg | Freq: Four times a day (QID) | INTRAMUSCULAR | Status: DC | PRN
  Administered 2021-07-01 – 2021-07-07 (×2): 4 mg via INTRAVENOUS
  Filled 2021-07-01 (×3): qty 2

## 2021-07-01 MED ORDER — ORAL CARE MOUTH RINSE: 15.0000 mL | Freq: Once | OROMUCOSAL | Status: AC

## 2021-07-01 MED ORDER — ALBUMIN HUMAN 5 % IV SOLN: INTRAVENOUS | Status: DC | PRN

## 2021-07-01 MED ORDER — HYDROMORPHONE 1 MG/ML IV SOLN
INTRAVENOUS | Status: DC
Start: 2021-07-01 — End: 2021-07-02
  Administered 2021-07-01: 30 mg via INTRAVENOUS
  Administered 2021-07-02: 0 mg via INTRAVENOUS
  Administered 2021-07-02: 0.8 mg via INTRAVENOUS
  Filled 2021-07-01: qty 30

## 2021-07-01 MED ORDER — ONDANSETRON HCL 4 MG/2ML IJ SOLN
INTRAMUSCULAR | Status: DC | PRN
Start: 2021-07-01 — End: 2021-07-01
  Administered 2021-07-01: 4 mg via INTRAVENOUS

## 2021-07-01 MED ORDER — LACTATED RINGERS IV SOLN: INTRAVENOUS | Status: DC | PRN

## 2021-07-01 MED ORDER — CYCLOSPORINE 0.05 % OP EMUL
1.0000 [drp] | Freq: Two times a day (BID) | OPHTHALMIC | Status: DC | PRN
  Filled 2021-07-01: qty 1

## 2021-07-01 MED ORDER — FLUOXETINE HCL 20 MG PO CAPS
40.0000 mg | ORAL_CAPSULE | Freq: Every day | ORAL | Status: DC
  Administered 2021-07-02 – 2021-07-09 (×8): 40 mg via ORAL
  Filled 2021-07-01 (×8): qty 2

## 2021-07-01 MED ORDER — DEXAMETHASONE SODIUM PHOSPHATE 10 MG/ML IJ SOLN
INTRAMUSCULAR | Status: AC
  Filled 2021-07-01: qty 1

## 2021-07-01 MED ORDER — PROPOFOL 10 MG/ML IV BOLUS
INTRAVENOUS | Status: DC | PRN
  Administered 2021-07-01: 50 mg via INTRAVENOUS
  Administered 2021-07-01: 150 mg via INTRAVENOUS

## 2021-07-01 MED ORDER — BUPIVACAINE-EPINEPHRINE (PF) 0.25% -1:200000 IJ SOLN
INTRAMUSCULAR | Status: AC
  Filled 2021-07-01: qty 30

## 2021-07-01 MED ORDER — NALOXONE HCL 0.4 MG/ML IJ SOLN: 0.4000 mg | INTRAMUSCULAR | Status: DC | PRN

## 2021-07-01 MED ORDER — PANTOPRAZOLE SODIUM 40 MG PO TBEC
40.0000 mg | DELAYED_RELEASE_TABLET | Freq: Every day | ORAL | Status: DC
  Administered 2021-07-02 – 2021-07-09 (×8): 40 mg via ORAL
  Filled 2021-07-01 (×8): qty 1

## 2021-07-01 MED ORDER — KETAMINE HCL 10 MG/ML IJ SOLN
INTRAMUSCULAR | Status: DC | PRN
  Administered 2021-07-01: 30 mg via INTRAVENOUS
  Administered 2021-07-01 (×2): 10 mg via INTRAVENOUS

## 2021-07-01 MED ORDER — PHENYLEPHRINE 40 MCG/ML (10ML) SYRINGE FOR IV PUSH (FOR BLOOD PRESSURE SUPPORT)
PREFILLED_SYRINGE | INTRAVENOUS | Status: DC | PRN
  Administered 2021-07-01: 120 ug via INTRAVENOUS
  Administered 2021-07-01 (×3): 80 ug via INTRAVENOUS
  Administered 2021-07-01: 120 ug via INTRAVENOUS
  Administered 2021-07-01 (×3): 80 ug via INTRAVENOUS

## 2021-07-01 MED ORDER — SODIUM CHLORIDE 0.9% FLUSH: 9.0000 mL | INTRAVENOUS | Status: DC | PRN

## 2021-07-01 MED ORDER — ROCURONIUM BROMIDE 10 MG/ML (PF) SYRINGE
PREFILLED_SYRINGE | INTRAVENOUS | Status: DC | PRN
  Administered 2021-07-01: 20 mg via INTRAVENOUS
  Administered 2021-07-01: 30 mg via INTRAVENOUS
  Administered 2021-07-01: 70 mg via INTRAVENOUS
  Administered 2021-07-01: 20 mg via INTRAVENOUS

## 2021-07-01 MED ORDER — CHLORHEXIDINE GLUCONATE CLOTH 2 % EX PADS
6.0000 | MEDICATED_PAD | Freq: Every day | CUTANEOUS | Status: DC
  Administered 2021-07-01 – 2021-07-09 (×7): 6 via TOPICAL

## 2021-07-01 MED ORDER — ACETAMINOPHEN 10 MG/ML IV SOLN
INTRAVENOUS | Status: AC
  Filled 2021-07-01: qty 100

## 2021-07-01 MED ORDER — DIPHENHYDRAMINE HCL 50 MG/ML IJ SOLN: 12.5000 mg | Freq: Four times a day (QID) | INTRAMUSCULAR | Status: DC | PRN

## 2021-07-01 MED ORDER — METRONIDAZOLE 500 MG/100ML IV SOLN
500.0000 mg | INTRAVENOUS | Status: AC
  Administered 2021-07-01: 500 mg via INTRAVENOUS

## 2021-07-01 MED ORDER — CHLORHEXIDINE GLUCONATE 0.12 % MT SOLN
OROMUCOSAL | Status: AC
  Administered 2021-07-01: 15 mL via OROMUCOSAL
  Filled 2021-07-01: qty 15

## 2021-07-01 MED ORDER — ENOXAPARIN SODIUM 40 MG/0.4ML IJ SOSY
40.0000 mg | PREFILLED_SYRINGE | INTRAMUSCULAR | Status: DC
  Administered 2021-07-02 – 2021-07-04 (×3): 40 mg via SUBCUTANEOUS
  Filled 2021-07-01 (×4): qty 0.4

## 2021-07-01 MED ORDER — ROCURONIUM BROMIDE 10 MG/ML (PF) SYRINGE
PREFILLED_SYRINGE | INTRAVENOUS | Status: AC
  Filled 2021-07-01: qty 10

## 2021-07-01 MED ORDER — SUGAMMADEX SODIUM 200 MG/2ML IV SOLN
INTRAVENOUS | Status: DC | PRN
  Administered 2021-07-01: 100 mg via INTRAVENOUS
  Administered 2021-07-01: 150 mg via INTRAVENOUS
  Administered 2021-07-01: 50 mg via INTRAVENOUS

## 2021-07-01 MED ORDER — HYDROMORPHONE HCL 1 MG/ML IJ SOLN
0.5000 mg | INTRAMUSCULAR | Status: DC | PRN
  Administered 2021-07-01 – 2021-07-02 (×2): 0.5 mg via INTRAVENOUS
  Filled 2021-07-01 (×2): qty 0.5

## 2021-07-01 MED ORDER — 0.9 % SODIUM CHLORIDE (POUR BTL) OPTIME
TOPICAL | Status: DC | PRN
  Administered 2021-07-01 (×4): 1000 mL

## 2021-07-01 MED ORDER — MIDAZOLAM HCL 2 MG/2ML IJ SOLN
INTRAMUSCULAR | Status: AC
  Filled 2021-07-01: qty 2

## 2021-07-01 MED ORDER — CIPROFLOXACIN IN D5W 400 MG/200ML IV SOLN
INTRAVENOUS | Status: AC
  Filled 2021-07-01: qty 200

## 2021-07-01 SURGICAL SUPPLY — 116 items
BAG BILE T-TUBES STRL (MISCELLANEOUS) IMPLANT
BAG COUNTER SPONGE SURGICOUNT (BAG) ×3 IMPLANT
BAG DRAINAGE 600ML DEPOT (BAG) IMPLANT
BIOPATCH RED 1 DISK 7.0 (GAUZE/BANDAGES/DRESSINGS) ×3 IMPLANT
BLADE CLIPPER SURG (BLADE) IMPLANT
BOOT SUTURE AID YELLOW STND (SUTURE) ×6 IMPLANT
CANISTER SUCT 3000ML PPV (MISCELLANEOUS) ×3 IMPLANT
CATH ROBINSON RED A/P 16FR (CATHETERS) IMPLANT
CHLORAPREP W/TINT 26 (MISCELLANEOUS) ×3 IMPLANT
CLIP VESOCCLUDE LG 6/CT (CLIP) ×3 IMPLANT
CLIP VESOCCLUDE MED 6/CT (CLIP) ×3 IMPLANT
CLIP VESOCCLUDE SM WIDE 6/CT (CLIP) ×3 IMPLANT
CNTNR URN SCR LID CUP LEK RST (MISCELLANEOUS) IMPLANT
CONT SPEC 4OZ STRL OR WHT (MISCELLANEOUS)
COUNTER NEEDLE 20 DBL MAG RED (NEEDLE) IMPLANT
COVER MAYO STAND STRL (DRAPES) IMPLANT
COVER SURGICAL LIGHT HANDLE (MISCELLANEOUS) ×3 IMPLANT
DERMABOND ADVANCED (GAUZE/BANDAGES/DRESSINGS) ×1
DERMABOND ADVANCED .7 DNX12 (GAUZE/BANDAGES/DRESSINGS) ×2 IMPLANT
DRAIN CHANNEL 19F RND (DRAIN) ×3 IMPLANT
DRAIN PENROSE 0.5X18 (DRAIN) IMPLANT
DRAPE INCISE IOBAN 66X45 STRL (DRAPES) ×3 IMPLANT
DRAPE LAPAROSCOPIC ABDOMINAL (DRAPES) IMPLANT
DRAPE WARM FLUID 44X44 (DRAPES) ×6 IMPLANT
DRSG COVADERM PLUS 2X2 (GAUZE/BANDAGES/DRESSINGS) IMPLANT
DRSG TEGADERM 4X4.75 (GAUZE/BANDAGES/DRESSINGS) ×3 IMPLANT
DRSG TELFA 3X8 NADH (GAUZE/BANDAGES/DRESSINGS) IMPLANT
ELECT BLADE 4.0 EZ CLEAN MEGAD (MISCELLANEOUS) ×3 IMPLANT
ELECT BLADE 6.5 EXT (BLADE) ×3 IMPLANT
ELECT CAUTERY BLADE 6.4 (BLADE) ×3 IMPLANT
ELECT PAD DSPR THERM+ ADLT (MISCELLANEOUS) ×3 IMPLANT
ELECT REM PT RETURN 9FT ADLT (ELECTROSURGICAL) ×3 IMPLANT
ELECTRODE BLDE 4.0 EZ CLN MEGD (MISCELLANEOUS) ×2 IMPLANT
ELECTRODE REM PT RTRN 9FT ADLT (ELECTROSURGICAL) ×2 IMPLANT
EVACUATOR SILICONE 100CC (DRAIN) IMPLANT
GAUZE 4X4 16PLY ~~LOC~~+RFID DBL (SPONGE) ×3 IMPLANT
GAUZE SPONGE 4X4 12PLY STRL (GAUZE/BANDAGES/DRESSINGS) IMPLANT
GEL ULTRASOUND 20GR AQUASONIC (MISCELLANEOUS) IMPLANT
GLOVE SURG POLY MICRO LF SZ5.5 (GLOVE) ×3 IMPLANT
GLOVE SURG SYN 5.5 (GLOVE) ×6 IMPLANT
GLOVE SURG UNDER POLY LF SZ6 (GLOVE) ×3 IMPLANT
GOWN STRL REUS W/ TWL LRG LVL3 (GOWN DISPOSABLE) ×10 IMPLANT
GOWN STRL REUS W/TWL LRG LVL3 (GOWN DISPOSABLE) ×5
HAND PENCIL TRP OPTION (MISCELLANEOUS) ×3 IMPLANT
HANDLE SUCTION POOLE (INSTRUMENTS) ×2 IMPLANT
HEMOSTAT ARISTA ABSORB 3G PWDR (HEMOSTASIS) ×3 IMPLANT
HEMOSTAT SNOW SURGICEL 2X4 (HEMOSTASIS) ×3 IMPLANT
HEMOSTAT SURGICEL 2X14 (HEMOSTASIS) IMPLANT
KIT BASIN OR (CUSTOM PROCEDURE TRAY) ×3 IMPLANT
KIT TUBE JEJUNAL 16FR (CATHETERS) IMPLANT
KIT TURNOVER KIT B (KITS) ×3 IMPLANT
L-HOOK LAP DISP 36CM (ELECTROSURGICAL) IMPLANT
LHOOK LAP DISP 36CM (ELECTROSURGICAL) IMPLANT
LOOP VESSEL MAXI BLUE (MISCELLANEOUS) ×3 IMPLANT
LOOP VESSEL MINI RED (MISCELLANEOUS) ×3 IMPLANT
MARKER SKIN DUAL TIP RULER LAB (MISCELLANEOUS) ×3 IMPLANT
NEEDLE INSUFFLATION 14GA 120MM (NEEDLE) ×3 IMPLANT
NS IRRIG 1000ML POUR BTL (IV SOLUTION) ×6 IMPLANT
PACK GENERAL/GYN (CUSTOM PROCEDURE TRAY) IMPLANT
PAD ARMBOARD 7.5X6 YLW CONV (MISCELLANEOUS) ×6 IMPLANT
PENCIL SMOKE EVACUATOR (MISCELLANEOUS) ×3 IMPLANT
PLUG CATH AND CAP STER (CATHETERS) IMPLANT
RELOAD PROXIMATE 75MM BLUE (ENDOMECHANICALS) ×3 IMPLANT
RELOAD PROXIMATE 75MM GREEN (ENDOMECHANICALS) IMPLANT
RETRACTOR WND ALEXIS 25 LRG (MISCELLANEOUS) IMPLANT
RETRACTOR WOUND ALXS 34CM XLRG (MISCELLANEOUS) ×2 IMPLANT
RTRCTR WOUND ALEXIS 25CM LRG (MISCELLANEOUS) IMPLANT
RTRCTR WOUND ALEXIS 34CM XLRG (MISCELLANEOUS) ×3 IMPLANT
SCISSORS LAP 5X35 DISP (ENDOMECHANICALS) IMPLANT
SEPRAFILM PROCEDURAL PACK 3X5 (MISCELLANEOUS) IMPLANT
SET IRRIG TUBING LAPAROSCOPIC (IRRIGATION / IRRIGATOR) IMPLANT
SET TUBE SMOKE EVAC HIGH FLOW (TUBING) ×3 IMPLANT
SHEARS FOC LG CVD HARMONIC 17C (MISCELLANEOUS) ×3 IMPLANT
SLEEVE ENDOPATH XCEL 5M (ENDOMECHANICALS) ×3 IMPLANT
SLEEVE SUCTION 125 (MISCELLANEOUS) IMPLANT
SLEEVE SUCTION CATH 165 (SLEEVE) IMPLANT
SPONGE INTESTINAL PEANUT (DISPOSABLE) IMPLANT
SPONGE SURGIFOAM ABS GEL 100 (HEMOSTASIS) IMPLANT
SPONGE T-LAP 18X18 ~~LOC~~+RFID (SPONGE) ×9 IMPLANT
STAPLER PROXIMATE 75MM BLUE (STAPLE) ×3 IMPLANT
STAPLER VISISTAT 35W (STAPLE) IMPLANT
SUCTION POOLE HANDLE (INSTRUMENTS) ×3 IMPLANT
SUT 5.0 PDS RB-1 (SUTURE)
SUT ETHILON 2 0 FS 18 (SUTURE) ×6 IMPLANT
SUT ETHILON 2 LR (SUTURE) IMPLANT
SUT MNCRL AB 4-0 PS2 18 (SUTURE) ×3 IMPLANT
SUT PDS AB 1 TP1 96 (SUTURE) ×6 IMPLANT
SUT PDS AB 3-0 SH 27 (SUTURE) IMPLANT
SUT PDS AB 4-0 RB1 27 (SUTURE) IMPLANT
SUT PDS II 5-0 RB-2 VIOLET (SUTURE) ×3 IMPLANT
SUT PDS PLUS AB 5-0 RB-1 (SUTURE) IMPLANT
SUT PROLENE 3 0 SH 48 (SUTURE) IMPLANT
SUT PROLENE 4 0 RB 1 (SUTURE) ×3
SUT PROLENE 4-0 RB1 .5 CRCL 36 (SUTURE) ×6 IMPLANT
SUT SILK 2 0 TIES 10X30 (SUTURE) ×3 IMPLANT
SUT SILK 2 0SH CR/8 30 (SUTURE) IMPLANT
SUT SILK 3 0 TIES 10X30 (SUTURE) ×3 IMPLANT
SUT SILK 3 0SH CR/8 30 (SUTURE) ×3 IMPLANT
SUT VIC AB 2-0 CT1 27 (SUTURE)
SUT VIC AB 2-0 CT1 TAPERPNT 27 (SUTURE) IMPLANT
SUT VIC AB 2-0 SH 18 (SUTURE) IMPLANT
SUT VIC AB 3-0 MH 27 (SUTURE) ×12 IMPLANT
SUT VIC AB 3-0 SH 18 (SUTURE) IMPLANT
SUT VIC AB 3-0 SH 27 (SUTURE) ×2
SUT VIC AB 3-0 SH 27X BRD (SUTURE) ×4 IMPLANT
SUT VICRYL AB 2 0 TIES (SUTURE) IMPLANT
TAPE UMBILICAL 1/8 X36 TWILL (MISCELLANEOUS) IMPLANT
TOWEL GREEN STERILE (TOWEL DISPOSABLE) ×3 IMPLANT
TOWEL GREEN STERILE FF (TOWEL DISPOSABLE) ×3 IMPLANT
TRAY FOLEY MTR SLVR 14FR STAT (SET/KITS/TRAYS/PACK) ×3 IMPLANT
TRAY LAPAROSCOPIC MC (CUSTOM PROCEDURE TRAY) ×3 IMPLANT
TROCAR XCEL BLUNT TIP 100MML (ENDOMECHANICALS) IMPLANT
TROCAR XCEL NON-BLD 5MMX100MML (ENDOMECHANICALS) ×3 IMPLANT
TUBE FEEDING 8FR 16IN STR KANG (MISCELLANEOUS) IMPLANT
TUBE FEEDING ENTERAL 5FR 16IN (TUBING) IMPLANT
WARMER LAPAROSCOPE (MISCELLANEOUS) ×3 IMPLANT

## 2021-07-01 NOTE — Anesthesia Procedure Notes (Signed)
Procedure Name: Intubation Date/Time: 07/01/2021 9:29 AM Performed by: Dorthea Cove, CRNA Pre-anesthesia Checklist: Patient identified, Emergency Drugs available, Suction available and Patient being monitored Patient Re-evaluated:Patient Re-evaluated prior to induction Oxygen Delivery Method: Circle System Utilized Preoxygenation: Pre-oxygenation with 100% oxygen Induction Type: IV induction Ventilation: Mask ventilation without difficulty and Oral airway inserted - appropriate to patient size Laryngoscope Size: Glidescope and 4 Grade View: Grade I Tube type: Oral Number of attempts: 1 Airway Equipment and Method: Stylet, Oral airway and Video-laryngoscopy Placement Confirmation: ETT inserted through vocal cords under direct vision, positive ETCO2 and breath sounds checked- equal and bilateral Secured at: 23 cm Tube secured with: Tape Dental Injury: Teeth and Oropharynx as per pre-operative assessment  Comments: Placed by Rexford Maus, SRNA

## 2021-07-01 NOTE — Op Note (Signed)
Date: 07/01/21  Patient: Crosby Oriordan MRN: [id number]  Preoperative Diagnosis: Pancreatic adenocarcinoma Postoperative Diagnosis: Same  Procedure: Staging laparoscopy Open distal pancreatectomy with en bloc splenectomy Intraoperative ultrasound  Surgeon: Michaelle Birks, MD Assistant: Georganna Skeans, MD  EBL: 1000 mL  Anesthesia: General  Specimens: Distal pancreas and spleen, pancreatic neck margin  Indications: Mr. Reihl is a 75 yo male who was noted in January 2022 to have a pancreatic ductal irregularity in the body of the pancreas on MRI. EUS confirmed a 2cm mass, and FNA was positive for malignant cells. This was staged as a uT2N0. He completed 6 cycles of neoadjuvant gemcitabine/abraxane and restaging scans showed no tumor progression or evidence of metastatic disease. He presents for surgical resection.  Findings: No palpable discrete mass within the pancreas, however there was a change in caliber of the main pancreatic duct on intraoperative ultrasound corresponding to the abnormality on MRI. This area of the gland was firm. Resection was performed via a distal pancreatectomy. The pancreatic neck margin was negative on frozen section.  Procedure details: Informed consent was obtained in the preoperative area prior to the procedure. The patient was brought to the operating room and placed on the table in the supine position. General anesthesia was induced and appropriate lines and drains were placed for intraoperative monitoring. Perioperative antibiotics were administered per SCIP guidelines. The abdomen was prepped and draped in the usual sterile fashion. A pre-procedure timeout was taken verifying patient identity, surgical site and procedure to be performed.  An small supraumbilical skin incision was made, and the subcutaneous tissue was divided with cautery. The fascia was grasped and elevated, and a Veress needle was inserted. Intraperitoneal placement was confirmed  with the saline meniscus test and the abdomen was insufflated. A 42mm port was placed. The peritoneal cavity was inspected, including the liver, both hemidiaphragms, and the peritoneal surface throughout the abdomen. There was no evidence of metastatic disease. The port was removed and the abdomen was desufflated.  An upper midline skin incision was made and the subcutaneous tissue was divided with cautery. The fascia was incised and opened along the linea alba and the peritoneal cavity was entered. An Allexis wound protector and Bookwalter fixed retractor were placed. The gastrocolic omentum was divided with harmonic shears to open the lesser sac. The hepatic flexure of the colon was mobilized and adhesions to the gallbladder fossa were taken down with cautery. The duodenum was widely kocherized. The head of the pancreas was palpated, and there were no palpable masses. The neck and body were palpated. The entire gland was somewhat firm, but this was more prominent in the neck and body. An intraoperative ultrasound of the pancreas was performed. The main pancreatic duct was visualized and it appeared normal within the head and neck of the pancreas. There were no masses in the head or neck of the pancreas. There was a slight change in caliber of the duct at the juncture of the neck and body, which correlated with the MRI findings. There was some hypoechoic irregularity of the gland in this area but it was difficult to appreciate a discrete mass. A small cyst was identified in the body of the gland, which communicated with the main duct. Because all imaging abnormalities in the pancreas were to the left of the SMV, the decision was made to proceed with a distal pancreatectomy. The inferior border of the pancreas was dissected out. The right gastroepiploic vein was ligated with 4-0 prolene near its insertion into the SMV.  A plane was bluntly created between the neck of the pancreas and the SMV/portal vein. The  superior border of the pancreas was dissected out overlying the portal vein, and a complete tunnel was created under the neck of the pancreas. The pancreas was then divided at the neck using cautery on the anterior portion, and a scalpel on the posterior portion through the duct. Frozen sections were sent from both ends of the divided pancreas and were negative for malignancy. The gastrocolic omentum was further divided moving up toward the fundus of the stomach. The short gastric vessels were divided with harmonic shears. The inferior border of the pancreas was further dissected out moving to the left. The splenic artery was ligated with 4-0 prolene suture on the inferior border of the pancreas. The splenic vein was circumferentially dissected out at its insertion into the SMV, and then doubly ligated with 2-0 silk ties and a 4-0 prolene suture ligature. The splenic flexure of the colon was taken down with cautery. The lienorenal and lienophrenic ligaments were taken down bluntly and the spleen and tail of the pancreas were mobilized medially off the retroperitoneum. The retroperitoneal attachments were taken down using blunt dissection and cautery. The IMV was ligated at the inferior border of the pancreas with 2-0 silk tie. At this point the pancreas remained attached to the root of the splenic artery. The splenic artery was skeletonized near its origin off the celiac trunk, and on occlusion there remained a pulse in the common hepatic artery. The splenic artery was doubly ligated near the root with 2-0 silk tie and 4-0 prolene suture ligature. The specimen was removed, oriented and passed of the field. The surgical site was irrigated. Clips were placed on the stumps of the short gastric vessels. Hemostasis was achieved on the retroperitoneum using clips and argon plasma coagulation. On the cut surface of the pancreas, the duct was oversewn with a 5-0 PDS horizontal mattress suture. The end of the gland was then  oversewn with 3-0 Vicryl horizontal mattress sutures, incorporating the capsule into the sutures. The surgical site was again irrigated and was hemostatic. A falciform ligament flap was mobilized and placed adjacent to the pancreatic remnant. A 19-Fr round blake drain was placed through the splenic fossa and adjacent to the pancreatic remnant, and brought out through the left abdominal wall. It was secured to the skin with 2-0 Nylon suture. The retractors and wound protector were removed. The fascia was closed with a running looped 1 PDS suture. Scarpa's layer was closed with a running 3-0 Vicryl suture and the skin was closed with a running subcuticular 4-0 monocryl.  The patient tolerated the procedure with no apparent complications. All counts were correct x2 at the end of the procedure. The patient was extubated and taken to PACU in stable condition.  Michaelle Birks, MD 07/01/21 3:48 PM

## 2021-07-01 NOTE — Anesthesia Postprocedure Evaluation (Signed)
Anesthesia Post Note  Patient: [name]  Procedure(s) Performed: STAGING LAPAROSCOPY (Abdomen) DISTAL PANCREATECTOMY (Abdomen) SPLENECTOMY (Abdomen)     Patient location during evaluation: PACU Anesthesia Type: General Level of consciousness: awake and alert Pain management: pain level controlled Vital Signs Assessment: post-procedure vital signs reviewed and stable Respiratory status: spontaneous breathing, nonlabored ventilation, respiratory function stable and patient connected to nasal cannula oxygen Cardiovascular status: blood pressure returned to baseline and stable Postop Assessment: no apparent nausea or vomiting Anesthetic complications: no Comments: Pt continues with neo gtt for BP support   No notable events documented.  Last Vitals:  Vitals:   07/01/21 1505 07/01/21 1520  BP: (!) 80/46 (!) 97/51  Pulse: 60 60  Resp: 19 (!) 21  Temp:    SpO2: 100% 96%    Last Pain:  Vitals:   07/01/21 1450  PainSc: 0-No pain                 Cayde Held S

## 2021-07-01 NOTE — Plan of Care (Signed)
  Problem: Clinical Measurements: Goal: Ability to maintain clinical measurements within normal limits will improve Outcome: Progressing Goal: Will remain free from infection Outcome: Progressing Goal: Diagnostic test results will improve Outcome: Progressing Goal: Respiratory complications will improve Outcome: Progressing Goal: Cardiovascular complication will be avoided Outcome: Progressing   Problem: Pain Managment: Goal: General experience of comfort will improve Outcome: Progressing   Problem: Safety: Goal: Ability to remain free from injury will improve Outcome: Progressing   Problem: Skin Integrity: Goal: Risk for impaired skin integrity will decrease Outcome: Progressing   

## 2021-07-01 NOTE — Interval H&P Note (Signed)
History and Physical Interval Note:  07/01/2021 8:27 AM  [name]  has presented today for surgery, with the diagnosis of pancreatic adenocarcinoma.  The various methods of treatment have been discussed with the patient and family. After consideration of risks, benefits and other options for treatment, the patient has consented to  Procedure(s): STAGING LAPAROSCOPY (N/A) [place] (N/A) as a surgical intervention.  The patient's history has been reviewed, patient examined, no change in status, stable for surgery.  I have reviewed the patient's chart and labs.  Questions were answered to the patient's satisfaction.     Dwan Bolt

## 2021-07-01 NOTE — Transfer of Care (Signed)
Immediate Anesthesia Transfer of Care Note  Patient: [name]  Procedure(s) Performed: STAGING LAPAROSCOPY (Abdomen) DISTAL PANCREATECTOMY (Abdomen) SPLENECTOMY (Abdomen)  Patient Location: PACU  Anesthesia Type:General  Level of Consciousness: awake, alert  and oriented  Airway & Oxygen Therapy: Patient Spontanous Breathing and Patient connected to face mask oxygen  Post-op Assessment: Report given to RN and Post -op Vital signs reviewed and stable  Post vital signs: Reviewed and stable  Last Vitals:  Vitals Value Taken Time  BP 92/58 07/01/21 1447  Temp    Pulse 66 07/01/21 1452  Resp 14 07/01/21 1452  SpO2 98 % 07/01/21 1452  Vitals shown include unvalidated device data.  Last Pain:  Vitals:   07/01/21 0824  PainSc: 0-No pain      Patients Stated Pain Goal: 0 (52/84/13 2440)  Complications: No notable events documented.

## 2021-07-01 NOTE — Anesthesia Procedure Notes (Signed)
Arterial Line Insertion Start/End7/18/2022 8:50 AM, 07/01/2021 9:05 AM Performed by: Roderic Palau, MD, anesthesiologist  Patient location: Pre-op. Preanesthetic checklist: patient identified, IV checked, site marked, risks and benefits discussed, surgical consent, monitors and equipment checked, pre-op evaluation, timeout performed and anesthesia consent Lidocaine 1% used for infiltration Left, brachial was placed Catheter size: 20 Fr Hand hygiene performed , maximum sterile barriers used  and Seldinger technique used  Attempts: 1 Procedure performed using ultrasound guided technique. Ultrasound Notes:anatomy identified, needle tip was noted to be adjacent to the nerve/plexus identified, no ultrasound evidence of intravascular and/or intraneural injection and image(s) printed for medical record Following insertion, dressing applied, Biopatch and line sutured. Post procedure assessment: normal and unchanged  Post procedure complications: second provider assisted. Patient tolerated the procedure well with no immediate complications.

## 2021-07-02 ENCOUNTER — Encounter (HOSPITAL_COMMUNITY): Payer: Self-pay | Admitting: Surgery

## 2021-07-02 DIAGNOSIS — E440 Moderate protein-calorie malnutrition: Secondary | ICD-10-CM | POA: Diagnosis present

## 2021-07-02 LAB — BASIC METABOLIC PANEL
Anion gap: 10 (ref 5–15)
BUN: 23 mg/dL (ref 8–23)
CO2: 22 mmol/L (ref 22–32)
Calcium: 8.4 mg/dL — ABNORMAL LOW (ref 8.9–10.3)
Chloride: 105 mmol/L (ref 98–111)
Creatinine, Ser: 1.34 mg/dL — ABNORMAL HIGH (ref 0.61–1.24)
GFR, Estimated: 55 mL/min — ABNORMAL LOW (ref >60)
Glucose, Bld: 162 mg/dL — ABNORMAL HIGH (ref 70–99)
Potassium: 4.2 mmol/L (ref 3.5–5.1)
Sodium: 137 mmol/L (ref 135–145)

## 2021-07-02 LAB — CBC
HCT: 30.5 % — ABNORMAL LOW (ref 39.0–52.0)
Hemoglobin: 9.8 g/dL — ABNORMAL LOW (ref 13.0–17.0)
MCH: 29.6 pg (ref 26.0–34.0)
MCHC: 32.1 g/dL (ref 30.0–36.0)
MCV: 92.1 fL (ref 80.0–100.0)
Platelets: 111 10*3/uL — ABNORMAL LOW (ref 150–400)
RBC: 3.31 MIL/uL — ABNORMAL LOW (ref 4.22–5.81)
RDW: 17.2 % — ABNORMAL HIGH (ref 11.5–15.5)
WBC: 17.6 10*3/uL — ABNORMAL HIGH (ref 4.0–10.5)
nRBC: 0 % (ref 0.0–0.2)

## 2021-07-02 LAB — GLUCOSE, CAPILLARY
Glucose-Capillary: 156 mg/dL — ABNORMAL HIGH (ref 70–99)
Glucose-Capillary: 164 mg/dL — ABNORMAL HIGH (ref 70–99)
Glucose-Capillary: 167 mg/dL — ABNORMAL HIGH (ref 70–99)
Glucose-Capillary: 191 mg/dL — ABNORMAL HIGH (ref 70–99)
Glucose-Capillary: 192 mg/dL — ABNORMAL HIGH (ref 70–99)
Glucose-Capillary: 198 mg/dL — ABNORMAL HIGH (ref 70–99)

## 2021-07-02 LAB — AMYLASE, PLEURAL OR PERITONEAL FLUID: Amylase, Fluid: 423 U/L

## 2021-07-02 MED ORDER — MIDODRINE HCL 5 MG PO TABS
5.0000 mg | ORAL_TABLET | Freq: Three times a day (TID) | ORAL | Status: DC
  Administered 2021-07-02 – 2021-07-04 (×7): 5 mg via ORAL
  Filled 2021-07-02 (×9): qty 1

## 2021-07-02 MED ORDER — PROMETHAZINE HCL 12.5 MG PO TABS
12.5000 mg | ORAL_TABLET | Freq: Four times a day (QID) | ORAL | Status: DC | PRN
  Administered 2021-07-04: 12.5 mg via ORAL
  Filled 2021-07-02 (×2): qty 1

## 2021-07-02 MED ORDER — PROMETHAZINE HCL 25 MG RE SUPP
25.0000 mg | Freq: Four times a day (QID) | RECTAL | Status: DC | PRN
  Filled 2021-07-02: qty 1

## 2021-07-02 MED ORDER — SODIUM CHLORIDE 0.9 % IV BOLUS
500.0000 mL | Freq: Once | INTRAVENOUS | Status: AC
  Administered 2021-07-02: 500 mL via INTRAVENOUS

## 2021-07-02 MED ORDER — SODIUM CHLORIDE 0.9 % IV SOLN
6.2500 mg | Freq: Four times a day (QID) | INTRAVENOUS | Status: DC | PRN
  Administered 2021-07-02: 6.25 mg via INTRAVENOUS
  Filled 2021-07-02 (×2): qty 0.25

## 2021-07-02 MED ORDER — TAMSULOSIN HCL 0.4 MG PO CAPS
0.4000 mg | ORAL_CAPSULE | Freq: Every day | ORAL | Status: DC
  Administered 2021-07-02 – 2021-07-03 (×2): 0.4 mg via ORAL
  Filled 2021-07-02 (×2): qty 1

## 2021-07-02 MED ORDER — DEXTROSE-NACL 5-0.45 % IV SOLN: INTRAVENOUS | Status: DC

## 2021-07-02 MED ORDER — MORPHINE SULFATE 1 MG/ML IV SOLN PCA
INTRAVENOUS | Status: DC
Start: 2021-07-02 — End: 2021-07-03
  Administered 2021-07-02: 1 mg via INTRAVENOUS
  Administered 2021-07-02: 8 mg via INTRAVENOUS
  Administered 2021-07-03: 5 mg via INTRAVENOUS
  Filled 2021-07-02: qty 30

## 2021-07-02 NOTE — Evaluation (Addendum)
Physical Therapy Evaluation Patient Details Name: [name] MRN: [id number] DOB: [birth year] Today's Date: 07/02/2021   History of Present Illness  Pt adm 7/18 with pancreatic adenocarcinoma and underwent open distal pancreatectomy with en bloc splenectomy on 7/18. PMH - CABG, HTN, DM, afib, PTSD, orthostatic hypotension, prostate CA, agent orange exposure, afib.  Clinical Impression  Pt presents to PT with decr mobility due to weakness, orthostasis, and decr balance. Pt became very lightheaded taking pivotal steps to chair. Assisted pt to sitting and reclined chair and put feet up. Pt with brief decr in responsiveness but once feet up pt back to baseline. Pt motivated and expect he will make steady progress toward return home as long as his orthostatic hypotension is controlled.   Orthostatic BPs  Supine 112/61  Sitting 101/58  Sitting after 3 min 98/68  Sitting in recliner with feet up after transfer to chair 73/60       Follow Up Recommendations Home health PT;Supervision for mobility/OOB    Equipment Recommendations  None recommended by PT    Recommendations for Other Services       Precautions / Restrictions Precautions Precautions: Fall;Other (comment) Precaution Comments: Orthostatic      Mobility  Bed Mobility Overal bed mobility: Needs Assistance Bed Mobility: Supine to Sit     Supine to sit: +2 for physical assistance;Min assist     General bed mobility comments: Assist to bring legs off of bed and elevate trunk into sitting    Transfers Overall transfer level: Needs assistance Equipment used: Rolling walker (2 wheeled) Transfers: Sit to/from [employer] Transfers Sit to Stand: +2 physical assistance;Min assist Stand pivot transfers: +2 physical assistance;Min assist;Mod assist       General transfer comment: Min assist to bring hips up and begin pivotal steps from bed to chair. Before reaching chair pt became dizzy/lightheaded and  needed +2 mod assist to complete  Ambulation/Gait                Stairs            Wheelchair Mobility    Modified Rankin (Stroke Patients Only)       Balance Overall balance assessment: Needs assistance Sitting-balance support: No upper extremity supported;Feet supported Sitting balance-Leahy Scale: Fair     Standing balance support: Bilateral upper extremity supported Standing balance-Leahy Scale: Poor Standing balance comment: walker and min assist for static standing                             Pertinent Vitals/Pain Pain Assessment: Faces Faces Pain Scale: Hurts little more Pain Location: abdomen Pain Descriptors / Indicators: Grimacing;Guarding Pain Intervention(s): Limited activity within patient's tolerance;Monitored during session;Repositioned    Home Living Family/patient expects to be discharged to:: Private residence Living Arrangements: Spouse/significant other;Children Available Help at Discharge: Family Type of Home: House Home Access: Stairs to enter Entrance Stairs-Rails: Right Entrance Stairs-Number of Steps: 3 Home Layout: Two level;Able to live on main level with bedroom/bathroom Home Equipment: Walker - 4 wheels;Cane - single point      Prior Function Level of Independence: Independent with assistive device(s)         Comments: Using rollator     Hand Dominance   Dominant Hand: Right    Extremity/Trunk Assessment   Upper Extremity Assessment Upper Extremity Assessment: Defer to OT evaluation    Lower Extremity Assessment Lower Extremity Assessment: Generalized weakness       Communication  Communication: No difficulties  Cognition Arousal/Alertness: Awake/alert Behavior During Therapy: WFL for tasks assessed/performed Overall Cognitive Status: Within Functional Limits for tasks assessed                                        General Comments General comments (skin integrity, edema,  etc.): See assessment for BP's    Exercises     Assessment/Plan    PT Assessment Patient needs continued PT services  PT Problem List Decreased strength;Decreased activity tolerance;Decreased balance;Decreased mobility;Cardiopulmonary status limiting activity       PT Treatment Interventions DME instruction;Gait training;Stair training;Functional mobility training;Therapeutic activities;Therapeutic exercise;Balance training;Patient/family education    PT Goals (Current goals can be found in the Care Plan section)  Acute Rehab PT Goals Patient Stated Goal: return home PT Goal Formulation: With patient Time For Goal Achievement: 07/16/21 Potential to Achieve Goals: Good    Frequency Min 3X/week   Barriers to discharge        Co-evaluation               AM-PAC PT "6 Clicks" Mobility  Outcome Measure Help needed turning from your back to your side while in a flat bed without using bedrails?: A Little Help needed moving from lying on your back to sitting on the side of a flat bed without using bedrails?: Total Help needed moving to and from a bed to a chair (including a wheelchair)?: Total Help needed standing up from a chair using your arms (e.g., wheelchair or bedside chair)?: Total Help needed to walk in hospital room?: Total Help needed climbing 3-5 steps with a railing? : Total 6 Click Score: 8    End of Session   Activity Tolerance: Treatment limited secondary to medical complications (Comment) (orthostatic) Patient left: in chair;with call bell/phone within reach;with chair alarm set Nurse Communication: Mobility status;Need for lift equipment (recommend stedy for back to bed due to orthostasis) PT Visit Diagnosis: Other abnormalities of gait and mobility (R26.89)    Time: 1020-1047 PT Time Calculation (min) (ACUTE ONLY): 27 min   Charges:   PT Evaluation $PT Eval Moderate Complexity: 1 Mod PT Treatments $Therapeutic Activity: 8-22 mins        [place] Pager [phone] Office [place] 07/02/2021, 2:05 PM

## 2021-07-02 NOTE — Care Plan (Signed)
Pt positioned at side of bed for a few minutes.  stood for weight on standing scale, then sat back at side of bed,  Syncopal episode noted, placed back to bed. Stimulated , returned quickly to fully conscious.

## 2021-07-02 NOTE — Progress Notes (Signed)
Dilaudid PCA changed to morphine PCA this AM. 28mg  (16mL) dilaudid wasted from PCA syringe into 2H-A med room stericycle bin. No way to waste in [place] so note written for documentation. Waste witnessed by Terie Purser RN.

## 2021-07-02 NOTE — Progress Notes (Signed)
Patient is confused at this time. Patient is refusing medication and is suspicious about being in the hospital and what medications are being given to him. Patient is requesting the "medication be tested" before he will take them. Patient requested another nurse at bedside to explain to him again what medication he is taking. Patient then requested a pharmacist to come to bedside to explain what medication he is taking. I sent a note to pharmacy at patients request. Patient is unaware of what medication he takes at home, and states "it looks different" and that "I have a weird feeling about this" I suggested that patient contact his wife and allow her to speak to him. Patient refuses to allow me to help him call his wife, but is having difficulty contacting her. Patient also refuses to give medication back to this nurse for disposal as he states "hes going to get it tested"  Patient is alert and oriented to self, place and situation, but is confused and showing signs of paranoia

## 2021-07-02 NOTE — Progress Notes (Signed)
Contacted patients spouse to request she call patient as he is having a difficult time calling her from his cell phone

## 2021-07-02 NOTE — Progress Notes (Signed)
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  GOAL:   Patient will meet greater than or equal to 90% of their needs  MONITOR:   PO intake, Supplement acceptance, Weight trends, I & O's, Labs  REASON FOR ASSESSMENT:   Malnutrition Screening Tool    ASSESSMENT:   Patient with PMH significant for DM, HTN, CKD III, HLD, CAD s/p CABG, prostate cancer, and pancreatic cancer (s/p chemotherapy). Presents this admission for pancreatectomy.  7/18- s/p laparoscopy, open distal pancreatectomy with en bloc splenectomy  Patient endorses loss in appetite over the last few months. States during this time he consumed B- shredded wheat with milk L- shredded wheat and protein drink D- meat, vegetable, grain. Patient unsure of which protein supplement he uses. Currently allowed sips of clear and ice chips. Discussed the importance of protein intake for preservation of lean body mass and to promote post op healing. Patient willing to have supplementation once diet advanced.   Patient endorses a UBW of 280 lb and an unintentional weight loss of 50 lb over the last three months. Records indicate patient weighed 281 lb on 2/17 and 253 lb this admission (10.0% wt loss in 5 months, significant for time frame).   UOP: 1435 ml x 24 hrs  JP drain: 545 ml x 24 hrs   Drips: D5 in 1/2 NS @ 75 ml/hr, neosynephrine, phenergan Medications: SS novolog, lantus Labs: CBG 153-198  NUTRITION - FOCUSED PHYSICAL EXAM:  Flowsheet Row Most Recent Value  Orbital Region No depletion  Upper Arm Region No depletion  Thoracic and  Lumbar Region No depletion  Buccal Region No depletion  Temple Region No depletion  Clavicle Bone Region No depletion  Clavicle and Acromion Bone Region No depletion  Scapular Bone Region No depletion  Dorsal Hand No depletion  Patellar Region No depletion  Anterior Thigh Region No depletion  Posterior Calf Region No depletion  Edema (RD Assessment) Mild  Hair Reviewed  Eyes Reviewed  Mouth Reviewed  Skin Reviewed  Nails Reviewed      Diet Order:   Diet Order             Diet NPO time specified Except for: Sips with Meds, Ice Chips, Other (See Comments)  Diet effective now                   EDUCATION NEEDS:   Education needs have been addressed  Skin:  Skin Assessment: Skin Integrity Issues: Skin Integrity Issues:: Incisions Incisions: abdomen  Last BM:  PTA  Height:   Ht Readings from Last 1 Encounters:  06/27/21 5\' 11"  (1.803 m)    Weight:   Wt Readings from Last 1 Encounters:  07/02/21 114.9 kg    BMI:  Body mass index is 35.33 kg/m.  Estimated Nutritional Needs:   Kcal:  2300-2600 kcal  Protein:  115-130 grams  Fluid:  >/= 2 L/day  Mariana Single MS, RD, LDN, CNSC Clinical Nutrition Pager listed in [place]

## 2021-07-02 NOTE — Progress Notes (Addendum)
1 Day Post-Op  Subjective: Patient had a brief syncopal episode this morning while standing for a weight check, recovered when supine. Has been hemodynamically stable overnight. Drain output was initially high postop but has slowed down. Hgb stable this morning. UOP about 30 ml/hr overnight, slight bump in creatinine. Patient reports nausea only after pushing his PCA. NG output 150. Drain amylase 423 today.  Objective: Vital signs in last 24 hours: Temp:  [97.8 F (36.6 C)-98.5 F (36.9 C)] 98.1 F (36.7 C) (07/19 0750) Pulse Rate:  [60-100] 92 (07/19 0800) Resp:  [0-25] 25 (07/19 0817) BP: (80-140)/(46-97) 115/57 (07/19 0800) SpO2:  [92 %-100 %] 95 % (07/19 0817) Arterial Line BP: (83-166)/(37-73) 126/52 (07/19 0800) FiO2 (%):  [21 %] 21 % (07/19 0817) Weight:  [114.9 kg] 114.9 kg (07/19 0600) Last BM Date:  (PTA)  Intake/Output from previous day: 07/18 0701 - 07/19 0700 In: 6516.2 [I.V.:4943.2; Blood:323; IV Piggyback:1250] Out: 4709 [Urine:1435; Emesis/NG output:150; Drains:545; Blood:1000] Intake/Output this shift: Total I/O In: 100 [I.V.:100] Out: 50 [Urine:50]  PE: General: resting comfortably, NAD Neuro: alert and oriented, no focal deficits Resp: normal work of breathing CV: RRR Abdomen: soft, nondistended, mildly tender. Midline incision clean and dry with no erythema or induration. JP with serosanguinous drainage. Extremities: warm and well-perfused GU: foley draining clear yellow urine  Lab Results:  Recent Labs    07/01/21 1530 07/02/21 0306  WBC 11.3* 17.6*  HGB 10.1* 9.8*  HCT 31.1* 30.5*  PLT 107* 111*   BMET Recent Labs    07/01/21 1530 07/02/21 0306  NA 136 137  K 3.9 4.2  CL 105 105  CO2 22 22  GLUCOSE 172* 162*  BUN 19 23  CREATININE 1.19 1.34*  CALCIUM 7.9* 8.4*   PT/INR No results for input(s): LABPROT, INR in the last 72 hours. CMP     Component Value Date/Time   NA 137 07/02/2021 0306   K 4.2 07/02/2021 0306   CL 105  07/02/2021 0306   CO2 22 07/02/2021 0306   GLUCOSE 162 (H) 07/02/2021 0306   BUN 23 07/02/2021 0306   CREATININE 1.34 (H) 07/02/2021 0306   CREATININE 1.18 06/12/2021 1002   CREATININE 1.58 (H) 10/10/2020 0940   CALCIUM 8.4 (L) 07/02/2021 0306   PROT 5.0 (L) 07/01/2021 1530   ALBUMIN 3.1 (L) 07/01/2021 1530   AST 29 07/01/2021 1530   AST 19 06/12/2021 1002   ALT 23 07/01/2021 1530   ALT 14 06/12/2021 1002   ALKPHOS 42 07/01/2021 1530   BILITOT 0.9 07/01/2021 1530   BILITOT 0.7 06/12/2021 1002   GFRNONAA 55 (L) 07/02/2021 0306   GFRNONAA >60 06/12/2021 1002   GFRNONAA 42 (L) 10/10/2020 0940   GFRAA 49 (L) 10/10/2020 0940   Lipase     Component Value Date/Time   LIPASE 153 (H) 10/17/2020 1022       Studies/Results: No results found.  Anti-infectives: Anti-infectives (From admission, onward)    Start     Dose/Rate Route Frequency Ordered Stop   07/01/21 0815  metroNIDAZOLE (FLAGYL) IVPB 500 mg       See Hyperspace for full Linked Orders Report.   500 mg 100 mL/hr over 60 Minutes Intravenous On call to O.R. 07/01/21 [id number] 07/01/21 0933   07/01/21 0815  ciprofloxacin (CIPRO) IVPB 400 mg       See Hyperspace for full Linked Orders Report.   400 mg 200 mL/hr over 60 Minutes Intravenous On call to O.R. 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 - Change from dilaudid to morphine PCA as patient is having nausea with dilaudid - Remove NG tube, ok for ice chips and sips of clear liquids. Continue maintenance IV fluids. - Keep foley to monitor UOP due to rise in creatinine. AKI likely secondary to hypotension with pressor requirement yesterday. - Syncopal episode this morning consistent with orthostasis, which patient has had previously. Resume home midodrine this morning. - Remove a-line - PT ordered - Repeat drain amylase on POD3 - Diabetes: On lantus at half  of home dose while NPO (30 units) with novolog correction q4h. - VTE: lovenox, SCDs - Dispo: inpatient, ICU    LOS: 1 day    Michaelle Birks, MD [facility] Surgery General, Hepatobiliary and Pancreatic Surgery 07/02/21 9:09 AM

## 2021-07-02 NOTE — Progress Notes (Signed)
Patients spouse called patient and reassured him that he is taking the same medication. Patient states "I just wanted you to know incase they kill me tonight"   Charge RN at bedside with me during this  Patient agreed to take night medication, patient repositioned to a position of comfort and resting.

## 2021-07-03 LAB — BASIC METABOLIC PANEL
Anion gap: 4 — ABNORMAL LOW (ref 5–15)
BUN: 28 mg/dL — ABNORMAL HIGH (ref 8–23)
CO2: 25 mmol/L (ref 22–32)
Calcium: 8.2 mg/dL — ABNORMAL LOW (ref 8.9–10.3)
Chloride: 105 mmol/L (ref 98–111)
Creatinine, Ser: 1.38 mg/dL — ABNORMAL HIGH (ref 0.61–1.24)
GFR, Estimated: 53 mL/min — ABNORMAL LOW (ref >60)
Glucose, Bld: 164 mg/dL — ABNORMAL HIGH (ref 70–99)
Potassium: 4 mmol/L (ref 3.5–5.1)
Sodium: 134 mmol/L — ABNORMAL LOW (ref 135–145)

## 2021-07-03 LAB — CBC
HCT: 26 % — ABNORMAL LOW (ref 39.0–52.0)
Hemoglobin: 8.1 g/dL — ABNORMAL LOW (ref 13.0–17.0)
MCH: 29.3 pg (ref 26.0–34.0)
MCHC: 31.2 g/dL (ref 30.0–36.0)
MCV: 94.2 fL (ref 80.0–100.0)
Platelets: 127 10*3/uL — ABNORMAL LOW (ref 150–400)
RBC: 2.76 MIL/uL — ABNORMAL LOW (ref 4.22–5.81)
RDW: 16.7 % — ABNORMAL HIGH (ref 11.5–15.5)
WBC: 18.3 10*3/uL — ABNORMAL HIGH (ref 4.0–10.5)
nRBC: 0 % (ref 0.0–0.2)

## 2021-07-03 LAB — GLUCOSE, CAPILLARY
Glucose-Capillary: 119 mg/dL — ABNORMAL HIGH (ref 70–99)
Glucose-Capillary: 130 mg/dL — ABNORMAL HIGH (ref 70–99)
Glucose-Capillary: 145 mg/dL — ABNORMAL HIGH (ref 70–99)
Glucose-Capillary: 153 mg/dL — ABNORMAL HIGH (ref 70–99)
Glucose-Capillary: 159 mg/dL — ABNORMAL HIGH (ref 70–99)
Glucose-Capillary: 191 mg/dL — ABNORMAL HIGH (ref 70–99)
Glucose-Capillary: 194 mg/dL — ABNORMAL HIGH (ref 70–99)

## 2021-07-03 MED ORDER — TRAZODONE HCL 100 MG PO TABS
100.0000 mg | ORAL_TABLET | Freq: Every day | ORAL | Status: DC
  Administered 2021-07-03 – 2021-07-08 (×6): 100 mg via ORAL
  Filled 2021-07-03 (×2): qty 1
  Filled 2021-07-03 (×2): qty 2
  Filled 2021-07-03: qty 1
  Filled 2021-07-03: qty 2
  Filled 2021-07-03: qty 1

## 2021-07-03 MED ORDER — ADULT MULTIVITAMIN W/MINERALS CH
1.0000 | ORAL_TABLET | Freq: Every day | ORAL | Status: DC
  Administered 2021-07-03 – 2021-07-09 (×7): 1 via ORAL
  Filled 2021-07-03 (×7): qty 1

## 2021-07-03 MED ORDER — ACETAMINOPHEN 325 MG PO TABS
650.0000 mg | ORAL_TABLET | Freq: Four times a day (QID) | ORAL | Status: DC
  Administered 2021-07-03 – 2021-07-09 (×22): 650 mg via ORAL
  Filled 2021-07-03 (×23): qty 2

## 2021-07-03 MED ORDER — OXYCODONE HCL 5 MG PO TABS
5.0000 mg | ORAL_TABLET | ORAL | Status: DC | PRN
  Administered 2021-07-03 – 2021-07-05 (×3): 5 mg via ORAL
  Filled 2021-07-03 (×3): qty 1

## 2021-07-03 MED ORDER — BOOST / RESOURCE BREEZE PO LIQD CUSTOM
1.0000 | Freq: Three times a day (TID) | ORAL | Status: DC
  Administered 2021-07-03 – 2021-07-08 (×11): 1 via ORAL

## 2021-07-03 MED ORDER — MORPHINE SULFATE (PF) 2 MG/ML IV SOLN: 2.0000 mg | INTRAVENOUS | Status: DC | PRN

## 2021-07-03 MED ORDER — SALINE SPRAY 0.65 % NA SOLN
2.0000 | NASAL | Status: DC | PRN
  Administered 2021-07-03: 2 via NASAL
  Filled 2021-07-03: qty 44

## 2021-07-03 MED ORDER — DIAZEPAM 5 MG PO TABS
5.0000 mg | ORAL_TABLET | Freq: Three times a day (TID) | ORAL | Status: DC | PRN
  Administered 2021-07-03: 5 mg via ORAL
  Filled 2021-07-03: qty 1

## 2021-07-03 NOTE — Evaluation (Signed)
Occupational Therapy Evaluation Patient Details Name: [name] MRN: [id number] DOB: 11-19-1946 Today's Date: 07/03/2021    History of Present Illness Pt adm 7/18 with pancreatic adenocarcinoma and underwent open distal pancreatectomy with en bloc splenectomy on 7/18. PMH - CABG, HTN, DM, afib, PTSD, orthostatic hypotension, prostate CA, agent orange exposure, afib.   Clinical Impression   PTA patient independent with ADLs using rollator or cane as needed, spouse assists with IADLs. Admitted for above and presenting with problem list below, including impaired balance, decreased activity tolerance, dizziness in standing and abdominal pain.  Patient requires up to min assist +2 for transfers with limited standing tolerance, max assist for LB ADLs and min assist for UB ADLs.  BP in sitting 134/70, with attempted BP in standing but limited tolerance and difficulty getting accurate BP (BP reading 51/34 but unclear if accurate) as recovered quickly to 132/47 once seated. Based on performance today, further OT services recommended and after dc at [facility] after dc to optimize return to PLOF.  Will follow.     Follow Up Recommendations  Home health OT;Supervision/Assistance - 24 hour    Equipment Recommendations  3 in 1 bedside commode    Recommendations for Other Services       Precautions / Restrictions Precautions Precautions: Fall;Other (comment) Precaution Comments: Orthostatic      Mobility Bed Mobility               General bed mobility comments: OOB in recliner upon entry    Transfers Overall transfer level: Needs assistance Equipment used:  (eva) Transfers: Sit to/from Stand Sit to Stand: Min assist;+2 physical assistance;+2 safety/equipment         General transfer comment: min assist to power up and steady, dizzy upon standing    Balance Overall balance assessment: Needs assistance Sitting-balance support: No upper extremity supported;Feet  supported Sitting balance-Leahy Scale: Fair     Standing balance support: Bilateral upper extremity supported;During functional activity Standing balance-Leahy Scale: Poor Standing balance comment: relies on BUE and external support                           ADL either performed or assessed with clinical judgement   ADL Overall ADL's : Needs assistance/impaired     Grooming: Set up           Upper Body Dressing : Minimal assistance;Sitting   Lower Body Dressing: Maximal assistance;+2 for safety/equipment;+2 for physical assistance;Sit to/from stand Lower Body Dressing Details (indicate cue type and reason): requires assist with socks, min assist +2 safety standing             Functional mobility during ADLs: Minimal assistance;+2 for physical assistance;+2 for safety/equipment (eva walker)       Vision Patient Visual Report: No change from baseline       Perception     Praxis      Pertinent Vitals/Pain Pain Assessment: Faces Faces Pain Scale: Hurts little more Pain Location: abdomen Pain Descriptors / Indicators: Grimacing;Guarding Pain Intervention(s): Limited activity within patient's tolerance;Monitored during session;Repositioned     Hand Dominance Right   Extremity/Trunk Assessment Upper Extremity Assessment Upper Extremity Assessment: Generalized weakness (L shoulder "pulled muscle" with limited flexion to 90*)   Lower Extremity Assessment Lower Extremity Assessment: Defer to PT evaluation       Communication Communication Communication: No difficulties   Cognition Arousal/Alertness: Awake/alert Behavior During Therapy: WFL for tasks assessed/performed Overall Cognitive Status: Within Functional Limits for  tasks assessed                                     General Comments  dizzy and lightheaded in standing, difficult to get accurate BP reading today; cueing for PLB with RR up to 40 in standing and Spo2 98%     Exercises     Shoulder Instructions      Home Living Family/patient expects to be discharged to:: Private residence Living Arrangements: Spouse/significant other;Children Available Help at Discharge: Family Type of Home: House Home Access: Stairs to enter Technical brewer of Steps: 3 Entrance Stairs-Rails: Right Home Layout: Two level;Able to live on main level with bedroom/bathroom     Bathroom Shower/Tub: Occupational psychologist: Standard     Home Equipment: Environmental consultant - 4 wheels;Cane - single point;Shower seat          Prior Functioning/Environment Level of Independence: Independent with assistive device(s)        Comments: Using rollator or cane for mobility, ADLs but not IADLs        OT Problem List: Decreased strength;Decreased activity tolerance;Impaired balance (sitting and/or standing);Pain;Obesity;Decreased knowledge of precautions;Decreased knowledge of use of DME or AE;Decreased safety awareness      OT Treatment/Interventions: Self-care/ADL training;DME and/or AE instruction;Energy conservation;Therapeutic activities;Patient/family education;Balance training;Therapeutic exercise    OT Goals(Current goals can be found in the care plan section) Acute Rehab OT Goals Patient Stated Goal: return home OT Goal Formulation: With patient Time For Goal Achievement: 07/17/21 Potential to Achieve Goals: Good  OT Frequency: Min 2X/week   Barriers to D/C:            Co-evaluation              AM-PAC OT "6 Clicks" Daily Activity     Outcome Measure Help from another person eating meals?: A Little Help from another person taking care of personal grooming?: A Little Help from another person toileting, which includes using toliet, bedpan, or urinal?: A Lot Help from another person bathing (including washing, rinsing, drying)?: A Lot Help from another person to put on and taking off regular upper body clothing?: A Little Help from another  person to put on and taking off regular lower body clothing?: A Lot 6 Click Score: 15   End of Session Equipment Utilized During Treatment: Other (comment) (eva) Nurse Communication: Mobility status  Activity Tolerance: Patient tolerated treatment well Patient left: in chair;with call bell/phone within reach;with chair alarm set;with family/visitor present;with nursing/sitter in room  OT Visit Diagnosis: Other abnormalities of gait and mobility (R26.89);Muscle weakness (generalized) (M62.81);Dizziness and giddiness (R42);Pain Pain - part of body:  (stomach)                Time: 0160-1093 OT Time Calculation (min): 37 min Charges:  OT General Charges $OT Visit: 1 Visit OT Evaluation $OT Eval Moderate Complexity: 1 Mod  Jolaine Artist, OT Acute Rehabilitation Services Pager [phone] Office [phone]   Delight Stare 07/03/2021, 1:55 PM

## 2021-07-03 NOTE — Progress Notes (Signed)
Morphine PCA 49ml wasted in stericycle witnessed by Desmond Dike, RN

## 2021-07-03 NOTE — Progress Notes (Signed)
Physical Therapy Treatment Patient Details Name: [name] MRN: [id number] DOB: [birth year] Today's Date: 07/03/2021    History of Present Illness Pt adm 7/18 with pancreatic adenocarcinoma and underwent open distal pancreatectomy with en bloc splenectomy on 7/18. PMH - CABG, HTN, DM, afib, PTSD, orthostatic hypotension, prostate CA, agent orange exposure, afib.    PT Comments    Pt willing and participative despite abdominal pain and having to deal with dizziness from suspected low BP's though BP readings were sketchy.  Emphasis on sit to standing x3,  standing activity/ pre-gait while trying to get BP readings and stepping forward/back x2.  Limited by dizziness and fatigue.   Follow Up Recommendations  Home health PT;Supervision for mobility/OOB     Equipment Recommendations  None recommended by PT    Recommendations for Other Services       Precautions / Restrictions Precautions Precautions: Fall;Other (comment) Precaution Comments: Orthostatic    Mobility  Bed Mobility               General bed mobility comments: OOB in recliner upon entry    Transfers Overall transfer level: Needs assistance Equipment used: Ambulation equipment used (eva) Transfers: Sit to/from Stand Sit to Stand: Min assist;+2 physical assistance;+2 safety/equipment         General transfer comment: min assist to power up and steady, dizzy upon standing  Ambulation/Gait Ambulation/Gait assistance: Min assist;+2 safety/equipment Gait Distance (Feet): 2 Feet (forward and back x2 with EVA)   Gait Pattern/deviations: Step-through pattern     General Gait Details: short unsteady steps.  Limited overall by orthostatic BP's   Stairs             Wheelchair Mobility    Modified Rankin (Stroke Patients Only)       Balance Overall balance assessment: Needs assistance Sitting-balance support: No upper extremity supported;Feet supported Sitting balance-Leahy Scale:  Fair     Standing balance support: Bilateral upper extremity supported;During functional activity Standing balance-Leahy Scale: Poor Standing balance comment: relies on BUE and external support.  Added in some work of w/shifting and stepping in place                            Cognition Arousal/Alertness: Awake/alert Behavior During Therapy: WFL for tasks assessed/performed Overall Cognitive Status: Within Functional Limits for tasks assessed                                        Exercises      General Comments General comments (skin integrity, edema, etc.): dizzy and lightheaded in standing, difficult to get accurate BP reading today; cueing for PLB with RR up to 40 in standing and Spo2 98%      Pertinent Vitals/Pain Pain Assessment: Faces Faces Pain Scale: Hurts little more Pain Location: abdomen Pain Descriptors / Indicators: Grimacing;Guarding Pain Intervention(s): Limited activity within patient's tolerance    Home Living Family/patient expects to be discharged to:: Private residence Living Arrangements: Spouse/significant other;Children Available Help at Discharge: Family Type of Home: House Home Access: Stairs to enter Entrance Stairs-Rails: Right Home Layout: Two level;Able to live on main level with bedroom/bathroom Home Equipment: Walker - 4 wheels;Cane - single point;Shower seat      Prior Function Level of Independence: Independent with assistive device(s)      Comments: Using rollator or cane for mobility, ADLs but not  IADLs   PT Goals (current goals can now be found in the care plan section) Acute Rehab PT Goals Patient Stated Goal: return home PT Goal Formulation: With patient Time For Goal Achievement: 07/16/21 Potential to Achieve Goals: Good    Frequency    Min 3X/week      PT Plan      Co-evaluation              AM-PAC PT "6 Clicks" Mobility   Outcome Measure  Help needed turning from your back to  your side while in a flat bed without using bedrails?: A Little Help needed moving from lying on your back to sitting on the side of a flat bed without using bedrails?: A Lot Help needed moving to and from a bed to a chair (including a wheelchair)?: A Lot Help needed standing up from a chair using your arms (e.g., wheelchair or bedside chair)?: A Lot Help needed to walk in hospital room?: A Lot Help needed climbing 3-5 steps with a railing? : A Lot 6 Click Score: 13    End of Session   Activity Tolerance: Treatment limited secondary to medical complications (Comment);Patient limited by fatigue (symptomatic low BP) Patient left: in chair;with call bell/phone within reach;with chair alarm set Nurse Communication: Mobility status PT Visit Diagnosis: Other abnormalities of gait and mobility (R26.89);Pain Pain - part of body:  (abdominal surgical sites)     Time: 1610-9604 PT Time Calculation (min) (ACUTE ONLY): 30 min  Charges:  $Therapeutic Activity: 8-22 mins                     07/03/2021  Jacinto Halim., PT Acute Rehabilitation Services [phone]  (pager) [phone]  (office)   [name] 07/03/2021, 5:40 PM

## 2021-07-03 NOTE — Progress Notes (Signed)
    2 Days Post-Op  Subjective: UOP significantly improved overnight, creatinine stable. Orthostatic yesterday morning when attempting to stand. Blood pressure has remained normal overnight. Overnight he was agitated and confused, but redirectable. Oriented this morning. Endorses mild nausea after pushing PCA but no vomiting.  Objective: Vital signs in last 24 hours: Temp:  [98.2 F (36.8 C)-99.2 F (37.3 C)] 98.2 F (36.8 C) (07/20 0740) Pulse Rate:  [81-107] 95 (07/20 0700) Resp:  [9-25] 17 (07/20 0735) BP: (73-143)/(41-77) 115/58 (07/20 0700) SpO2:  [86 %-100 %] 96 % (07/20 0735) Arterial Line BP: (93-163)/(42-100) 153/69 (07/20 0600) FiO2 (%):  [0 %-35 %] 21 % (07/20 0735) Last BM Date:  (PTA)  Intake/Output from previous day: 07/19 0701 - 07/20 0700 In: 2822.7 [I.V.:2320.2; IV Piggyback:502.5] Out: 1287 [Urine:1152; Drains:135] Intake/Output this shift: Total I/O In: -  Out: 59 [Urine:50]  PE: General: resting comfortably, NAD Neuro: alert and oriented, no focal deficits Resp: normal work of breathing CV: RRR Abdomen: soft, nondistended, appropriately tender to palpation. Midline incision clean and dry with no erythema or induration. JP with serosanguinous drainage. Extremities: warm and well-perfused GU: foley draining clear yellow urine  Lab Results:  Recent Labs    07/02/21 0306 07/03/21 0415  WBC 17.6* 18.3*  HGB 9.8* 8.1*  HCT 30.5* 26.0*  PLT 111* 127*   BMET Recent Labs    07/02/21 0306 07/03/21 0415  NA 137 134*  K 4.2 4.0  CL 105 105  CO2 22 25  GLUCOSE 162* 164*  BUN 23 28*  CREATININE 1.34* 1.38*  CALCIUM 8.4* 8.2*   PT/INR No results for input(s): LABPROT, INR in the last 72 hours. CMP     Component Value Date/Time   NA 134 (L) 07/03/2021 0415   K 4.0 07/03/2021 0415   CL 105 07/03/2021 0415   CO2 25 07/03/2021 0415   GLUCOSE 164 (H) 07/03/2021 0415   BUN 28 (H) 07/03/2021 0415   CREATININE 1.38 (H) 07/03/2021 0415    CREATININE 1.18 06/12/2021 1002   CREATININE 1.58 (H) 10/10/2020 0940   CALCIUM 8.2 (L) 07/03/2021 0415   PROT 5.0 (L) 07/01/2021 1530   ALBUMIN 3.1 (L) 07/01/2021 1530   AST 29 07/01/2021 1530   AST 19 06/12/2021 1002   ALT 23 07/01/2021 1530   ALT 14 06/12/2021 1002   ALKPHOS 42 07/01/2021 1530   BILITOT 0.9 07/01/2021 1530   BILITOT 0.7 06/12/2021 1002   GFRNONAA 53 (L) 07/03/2021 0415   GFRNONAA >60 06/12/2021 1002   GFRNONAA 42 (L) 10/10/2020 0940   GFRAA 49 (L) 10/10/2020 0940   Lipase     Component Value Date/Time   LIPASE 153 (H) 10/17/2020 1022       Assessment/Plan  75 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 - Agitation/delirium: discontinue PCA, minimize narcotics as much as possible, delirium precautions and sleep hygiene. - Pain control: low dose prn oxycodone and morphine, scheduled tylenol - Advance to clear liquid diet, decrease IV fluids - UOP much improved, remove foley. Catheterize prn. - Continue home midodrine. Flomax dose reduced to minimize orthostatic symptoms. - Mobilize, PT following - Repeat drain amylase on POD3 - Diabetes: Continue lantus 30units with novolog sliding scale - VTE: lovenox, SCDs - Dispo: inpatient, transfer to progressive care    LOS: 2 days    Michaelle Birks, MD [facility] Surgery General, Hepatobiliary and Pancreatic Surgery 07/03/21 7:58 AM

## 2021-07-03 NOTE — Progress Notes (Signed)
Pt has home cpap.  Assisted with getting CPAP machine ready for pt. No 02 required.

## 2021-07-04 LAB — GLUCOSE, CAPILLARY
Glucose-Capillary: 123 mg/dL — ABNORMAL HIGH (ref 70–99)
Glucose-Capillary: 111 mg/dL — ABNORMAL HIGH (ref 70–99)
Glucose-Capillary: 135 mg/dL — ABNORMAL HIGH (ref 70–99)
Glucose-Capillary: 168 mg/dL — ABNORMAL HIGH (ref 70–99)
Glucose-Capillary: 126 mg/dL — ABNORMAL HIGH (ref 70–99)
Glucose-Capillary: 126 mg/dL — ABNORMAL HIGH (ref 70–99)

## 2021-07-04 LAB — CBC
HCT: 24.5 % — ABNORMAL LOW (ref 39.0–52.0)
Hemoglobin: 7.8 g/dL — ABNORMAL LOW (ref 13.0–17.0)
MCH: 29.5 pg (ref 26.0–34.0)
MCHC: 31.8 g/dL (ref 30.0–36.0)
MCV: 92.8 fL (ref 80.0–100.0)
Platelets: 123 10*3/uL — ABNORMAL LOW (ref 150–400)
RBC: 2.64 MIL/uL — ABNORMAL LOW (ref 4.22–5.81)
RDW: 16 % — ABNORMAL HIGH (ref 11.5–15.5)
WBC: 16.2 10*3/uL — ABNORMAL HIGH (ref 4.0–10.5)
nRBC: 0.4 % — ABNORMAL HIGH (ref 0.0–0.2)

## 2021-07-04 LAB — BASIC METABOLIC PANEL
Anion gap: 6 (ref 5–15)
BUN: 16 mg/dL (ref 8–23)
CO2: 25 mmol/L (ref 22–32)
Calcium: 8.3 mg/dL — ABNORMAL LOW (ref 8.9–10.3)
Chloride: 104 mmol/L (ref 98–111)
Creatinine, Ser: 0.99 mg/dL (ref 0.61–1.24)
GFR, Estimated: >60 mL/min (ref >60)
Glucose, Bld: 131 mg/dL — ABNORMAL HIGH (ref 70–99)
Potassium: 3.8 mmol/L (ref 3.5–5.1)
Sodium: 135 mmol/L (ref 135–145)

## 2021-07-04 LAB — SURGICAL PATHOLOGY

## 2021-07-04 LAB — AMYLASE, PLEURAL OR PERITONEAL FLUID: Amylase, Fluid: 86 U/L

## 2021-07-04 MED ORDER — METOCLOPRAMIDE HCL 5 MG/ML IJ SOLN
10.0000 mg | Freq: Three times a day (TID) | INTRAMUSCULAR | Status: AC
  Administered 2021-07-04 – 2021-07-06 (×9): 10 mg via INTRAVENOUS
  Filled 2021-07-04 (×9): qty 2

## 2021-07-04 MED ORDER — DOCUSATE SODIUM 100 MG PO CAPS
100.0000 mg | ORAL_CAPSULE | Freq: Two times a day (BID) | ORAL | Status: DC
  Administered 2021-07-04 – 2021-07-08 (×8): 100 mg via ORAL
  Filled 2021-07-04 (×9): qty 1

## 2021-07-04 NOTE — Progress Notes (Signed)
    3 Days Post-Op  Subjective: Creatinine downtrending, good UOP, voiding spontaneously. No further confusion. Reports mild nausea this morning, no vomiting. No flatus or BM yet. Taking very little PO. Pain controlled. Vitals stable.  Objective: Vital signs in last 24 hours: Temp:  [97.8 F (36.6 C)-99.8 F (37.7 C)] 97.8 F (36.6 C) (07/21 0352) Pulse Rate:  [84-112] 88 (07/21 0600) Resp:  [12-28] 19 (07/21 0600) BP: (105-159)/(51-88) 122/69 (07/21 0600) SpO2:  [93 %-100 %] 95 % (07/21 0600) FiO2 (%):  [21 %] 21 % (07/20 0735) Last BM Date: 07/01/21  Intake/Output from previous day: 07/20 0701 - 07/21 0700 In: 1012.9 [I.V.:1012.9] Out: 2100 [Urine:2050; Drains:50] Intake/Output this shift: Total I/O In: 550 [I.V.:550] Out: 720 [Urine:700; Drains:20]  PE: General: resting comfortably, NAD Neuro: alert and oriented, no focal deficits Resp: normal work of breathing CV: irregular rhythm Abdomen: soft, nondistended, mildly tender to palpation. Midline incision clean and dry with no erythema or induration. JP with serosanguinous drainage. Extremities: warm and well-perfused  Lab Results:  Recent Labs    07/03/21 0415 07/04/21 0029  WBC 18.3* 16.2*  HGB 8.1* 7.8*  HCT 26.0* 24.5*  PLT 127* 123*   BMET Recent Labs    07/03/21 0415 07/04/21 0029  NA 134* 135  K 4.0 3.8  CL 105 104  CO2 25 25  GLUCOSE 164* 131*  BUN 28* 16  CREATININE 1.38* 0.99  CALCIUM 8.2* 8.3*   PT/INR No results for input(s): LABPROT, INR in the last 72 hours. CMP     Component Value Date/Time   NA 135 07/04/2021 0029   K 3.8 07/04/2021 0029   CL 104 07/04/2021 0029   CO2 25 07/04/2021 0029   GLUCOSE 131 (H) 07/04/2021 0029   BUN 16 07/04/2021 0029   CREATININE 0.99 07/04/2021 0029   CREATININE 1.18 06/12/2021 1002   CREATININE 1.58 (H) 10/10/2020 0940   CALCIUM 8.3 (L) 07/04/2021 0029   PROT 5.0 (L) 07/01/2021 1530   ALBUMIN 3.1 (L) 07/01/2021 1530   AST 29 07/01/2021 1530    AST 19 06/12/2021 1002   ALT 23 07/01/2021 1530   ALT 14 06/12/2021 1002   ALKPHOS 42 07/01/2021 1530   BILITOT 0.9 07/01/2021 1530   BILITOT 0.7 06/12/2021 1002   GFRNONAA >60 07/04/2021 0029   GFRNONAA >60 06/12/2021 1002   GFRNONAA 42 (L) 10/10/2020 0940   GFRAA 49 (L) 10/10/2020 0940   Lipase     Component Value Date/Time   LIPASE 153 (H) 10/17/2020 1022       Assessment/Plan  75 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 - Pain control: low dose prn oxycodone and morphine, scheduled tylenol - Continue clear liquid diet, IVF at 50 - Nausea: likely delayed gastric emptying due to significant mobilization of the stomach intraop. Begin reglan 10mg  q8h. - Orthostasis: improving, continue home midodrine - Mobilize, PT following - IS, pulmonary toilet, home CPAP at night - Drain amylase pending, if low will remove drain - Diabetes: Continue lantus 30units with novolog sliding scale - VTE: lovenox, SCDs - Dispo: inpatient, transfer to med-surg floor with telemetry    LOS: 3 days    Michaelle Birks, MD [facility] Surgery General, Hepatobiliary and Pancreatic Surgery 07/04/21 6:33 AM

## 2021-07-04 NOTE — Progress Notes (Signed)
Patient refused CPAP for the night  

## 2021-07-04 NOTE — Progress Notes (Addendum)
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 PMH - CABG, HTN, DM, afib, PTSD, orthostatic hypotension, prostate CA, agent orange exposure, afib.    PT Comments    Pt pleasant, in chair on arrival and without significant orthostasis today able to progress to limited ambulation. Pt reports continued abdominal pain and struggling with all mobility. He states at rest he feels fine but recognizes his limitations when moving and not ready for home yet. Will continue to follow and encouraged gait with nursing.   Sitting 127/58 (75) Standing 124/50 (72) HR 80-108 SpO2 96% on RA    Follow Up Recommendations  Home health PT;Supervision for mobility/OOB     Equipment Recommendations  None recommended by PT    Recommendations for Other Services       Precautions / Restrictions Precautions Precautions: Fall;Other (comment) Precaution Comments: jp drains Restrictions Weight Bearing Restrictions: No    Mobility  Bed Mobility               General bed mobility comments: in chair on arrival and end of session    Transfers Overall transfer level: Needs assistance   Transfers: Sit to/from Stand Sit to Stand: Min assist         General transfer comment: cues for scooting to edge of chair and hand placement with assist to rise  Ambulation/Gait Ambulation/Gait assistance: Min assist;+2 safety/equipment Gait Distance (Feet): 30 Feet Assistive device: Rolling walker (2 wheeled) Gait Pattern/deviations: Step-through pattern;Decreased stride length;Trunk flexed   Gait velocity interpretation: 1.31 - 2.62 ft/sec, indicative of limited community ambulator General Gait Details: cues for posture, stepping into Rw, eyes open and increasing distance. Chair  follow for safety. 30' x 2 trials with seated rest between trials. HR up to 108 with gait   Stairs             Wheelchair Mobility    Modified Rankin (Stroke Patients Only)       Balance Overall balance assessment: Needs assistance Sitting-balance support: No upper extremity supported;Feet supported Sitting balance-Leahy Scale: Fair     Standing balance support: Bilateral upper extremity supported;During functional activity Standing balance-Leahy Scale: Poor Standing balance comment: bil UE support on RW in standing                            Cognition Arousal/Alertness: Awake/alert Behavior During Therapy: WFL for tasks assessed/performed Overall Cognitive Status: Within Functional Limits for tasks assessed                                        Exercises General Exercises - Lower Extremity Long Arc Quad: AROM;Both;Seated;10 reps Hip Flexion/Marching: AROM;Both;Seated;10 reps    General Comments        Pertinent Vitals/Pain Pain Assessment: 0-10 Pain Score: 5  Pain Location: abdomen Pain Descriptors / Indicators: Aching;Guarding Pain Intervention(s): Limited activity within patient's tolerance;Repositioned    Home Living                      Prior Function            PT Goals (current goals can now be found in the care plan section) Progress towards  PT goals: Progressing toward goals    Frequency    Min 3X/week      PT Plan Current plan remains appropriate    Co-evaluation              AM-PAC PT "6 Clicks" Mobility   Outcome Measure  Help needed turning from your back to your side while in a flat bed without using bedrails?: A Little Help needed moving from lying on your back to sitting on the side of a flat bed without using bedrails?: A Lot Help needed moving to and from a bed to a chair (including a wheelchair)?: A Little Help needed standing up from a chair using your arms (e.g., wheelchair  or bedside chair)?: A Little Help needed to walk in hospital room?: A Lot Help needed climbing 3-5 steps with a railing? : A Lot 6 Click Score: 15    End of Session Equipment Utilized During Treatment: Gait belt Activity Tolerance: Patient tolerated treatment well Patient left: in chair;with call bell/phone within reach;with chair alarm set Nurse Communication: Mobility status PT Visit Diagnosis: Other abnormalities of gait and mobility (R26.89);Difficulty in walking, not elsewhere classified (R26.2)     Time: 4782-9562 PT Time Calculation (min) (ACUTE ONLY): 28 min  Charges:  $Therapeutic Activity: 8-22 mins                     Tima Curet P, PT Acute Rehabilitation Services Pager: [phone] Office: [phone]    Jayvin Hurrell B Marquavious Nazar 07/04/2021, 9:40 AM

## 2021-07-05 LAB — TYPE AND SCREEN
ABO/RH(D): O POS
Antibody Screen: NEGATIVE
Unit division: 0
Unit division: 0

## 2021-07-05 LAB — GLUCOSE, CAPILLARY
Glucose-Capillary: 113 mg/dL — ABNORMAL HIGH (ref 70–99)
Glucose-Capillary: 138 mg/dL — ABNORMAL HIGH (ref 70–99)
Glucose-Capillary: 207 mg/dL — ABNORMAL HIGH (ref 70–99)
Glucose-Capillary: 236 mg/dL — ABNORMAL HIGH (ref 70–99)
Glucose-Capillary: 94 mg/dL (ref 70–99)

## 2021-07-05 LAB — BPAM RBC
Blood Product Expiration Date: 202208142359
Blood Product Expiration Date: 202208182359
ISSUE DATE / TIME: 202207181346
Unit Type and Rh: 5100
Unit Type and Rh: 5100

## 2021-07-05 MED ORDER — APIXABAN 5 MG PO TABS
5.0000 mg | ORAL_TABLET | Freq: Two times a day (BID) | ORAL | Status: DC
  Administered 2021-07-05 – 2021-07-09 (×9): 5 mg via ORAL
  Filled 2021-07-05 (×9): qty 1

## 2021-07-05 MED ORDER — INSULIN ASPART 100 UNIT/ML IJ SOLN: 0.0000 [IU] | Freq: Every day | INTRAMUSCULAR | Status: DC

## 2021-07-05 MED ORDER — INSULIN ASPART 100 UNIT/ML IJ SOLN
0.0000 [IU] | Freq: Three times a day (TID) | INTRAMUSCULAR | Status: DC
  Administered 2021-07-05: 2 [IU] via SUBCUTANEOUS
  Administered 2021-07-06: 5 [IU] via SUBCUTANEOUS
  Administered 2021-07-07: 3 [IU] via SUBCUTANEOUS
  Administered 2021-07-07: 5 [IU] via SUBCUTANEOUS
  Administered 2021-07-08 – 2021-07-09 (×2): 3 [IU] via SUBCUTANEOUS

## 2021-07-05 NOTE — Progress Notes (Signed)
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 PMH - CABG, HTN, DM, afib, PTSD, orthostatic hypotension, prostate CA, agent orange exposure, afib.   OT comments  Patient seated in recliner, reports being up all night and having hallucinations- RN aware.  Assisted with gown and chair pad change due to IV leaking, RN notified.  Requires min assist for UB dressing, max assist for LB dressing, min assist for toilet transfer using RW and min assist for toileting.  Improved recall of hand placement for transfers after several trials during session, remains limited by weakness, decreased activity tolerance and balance.  VSS during session with no dizziness reported.  Will follow acutely.    Follow Up Recommendations  Home health OT;Supervision/Assistance - 24 hour    Equipment Recommendations  3 in 1 bedside commode    Recommendations for Other Services      Precautions / Restrictions Precautions Precautions: Fall Restrictions Weight Bearing Restrictions: No       Mobility Bed Mobility               General bed mobility comments: OOB in recliner upon entry    Transfers Overall transfer level: Needs assistance Equipment used: Rolling walker (2 wheeled) Transfers: Sit to/from [facility] Sit to Stand: Min assist Stand pivot transfers: Min assist       General transfer comment: min assist to power up and steady from recliner and BSC, cueing for hand placement but improved carryover at end of session    Balance Overall balance assessment: Needs assistance Sitting-balance support: No upper extremity supported;Feet supported Sitting balance-Leahy Scale: Fair     Standing balance support: Bilateral upper extremity  supported;During functional activity Standing balance-Leahy Scale: Poor Standing balance comment: bil UE support on RW in standing                           ADL either performed or assessed with clinical judgement   ADL Overall ADL's : Needs assistance/impaired     Grooming: Wash/dry face;Wash/dry hands;Set up;Sitting           Upper Body Dressing : Minimal assistance;Sitting Upper Body Dressing Details (indicate cue type and reason): to don new gown, limited L shoulder ROM Lower Body Dressing: Maximal assistance;Sit to/from stand Lower Body Dressing Details (indicate cue type and reason): reuqires assist for socks, min assist sit to stand Toilet Transfer: Minimal assistance;Stand-pivot;RW   Toileting- Clothing Manipulation and Hygiene: Minimal assistance;Sit to/from stand       Functional mobility during ADLs: Minimal assistance;Rolling walker;Cueing for safety General ADL Comments: pt limited by fatigue, weakness, balance     Vision       Perception     Praxis      Cognition Arousal/Alertness: Awake/alert Behavior During Therapy: Anxious;Flat affect Overall Cognitive Status: Within Functional Limits for tasks assessed                                 General Comments: WFL, but noted pt reports hallucinations.  Attempting to speak to MD and "man" that isn't present, when pt opens his eyes he voices "oh theres no one here".        Exercises     Shoulder Instructions  General Comments VSS, denies dizziness in standing; continues for preference of eyes closed    Pertinent Vitals/ Pain       Pain Assessment: Faces Faces Pain Scale: Hurts little more Pain Location: abdomen Pain Descriptors / Indicators: Aching;Guarding Pain Intervention(s): Limited activity within patient's tolerance;Monitored during session;Repositioned  Home Living                                          Prior Functioning/Environment               Frequency  Min 2X/week        Progress Toward Goals  OT Goals(current goals can now be found in the care plan section)  Progress towards OT goals: Progressing toward goals  Acute Rehab OT Goals Patient Stated Goal: return home OT Goal Formulation: With patient  Plan Discharge plan remains appropriate;Frequency remains appropriate    Co-evaluation                 AM-PAC OT "6 Clicks" Daily Activity     Outcome Measure   Help from another person eating meals?: A Little Help from another person taking care of personal grooming?: A Little Help from another person toileting, which includes using toliet, bedpan, or urinal?: A Little Help from another person bathing (including washing, rinsing, drying)?: A Lot Help from another person to put on and taking off regular upper body clothing?: A Little Help from another person to put on and taking off regular lower body clothing?: A Lot 6 Click Score: 16    End of Session Equipment Utilized During Treatment: Gait belt;Rolling walker  OT Visit Diagnosis: Other abnormalities of gait and mobility (R26.89);Muscle weakness (generalized) (M62.81);Dizziness and giddiness (R42);Pain Pain - part of body:  (abdomen)   Activity Tolerance Patient limited by fatigue   Patient Left in chair;with call bell/phone within reach;with chair alarm set;with nursing/sitter in room   Nurse Communication Mobility status;Other (comment) (IV leaking)        Time: [id number] OT Time Calculation (min): 34 min  Charges: OT General Charges $OT Visit: 1 Visit OT Treatments $Self Care/Home Management : 23-37 mins  [place] Pager [phone] Office [phone]    Delight Stare 07/05/2021, 10:13 AM

## 2021-07-05 NOTE — Progress Notes (Signed)
4 Days Post-Op  Subjective: No acute changes. Able to walk a little yesterday with no orthostasis. Afebrile, vitals stable. Passing flatus but no bowel movement yet. Tolerating clear liquids, no nausea or vomiting. Drain amylase low at 86. Reports he gets confused at night and has not been sleeping well.   Objective: Vital signs in last 24 hours: Temp:  [97.8 F (36.6 C)-99.4 F (37.4 C)] 97.9 F (36.6 C) (07/22 0739) Pulse Rate:  [84-112] 87 (07/22 0700) Resp:  [15-38] 25 (07/22 0700) BP: (82-173)/(49-112) 151/84 (07/22 0700) SpO2:  [89 %-100 %] 99 % (07/22 0700) Weight:  [117.9 kg] 117.9 kg (07/22 0530) Last BM Date: 07/01/21  Intake/Output from previous day: 07/21 0701 - 07/22 0700 In: 1439.9 [P.O.:340; I.V.:1099.9] Out: 2325 [Urine:2275; Drains:50] Intake/Output this shift: No intake/output data recorded.  PE: General: resting comfortably, NAD Neuro: sleepy but easily arousable and oriented, no focal deficits Resp: normal work of breathing CV: irregular rhythm Abdomen: soft, nondistended, nontender to palpation. Midline incision clean and dry with no erythema or induration. JP with serosanguinous drainage. Extremities: warm and well-perfused  Lab Results:  Recent Labs    07/03/21 0415 07/04/21 0029  WBC 18.3* 16.2*  HGB 8.1* 7.8*  HCT 26.0* 24.5*  PLT 127* 123*   BMET Recent Labs    07/03/21 0415 07/04/21 0029  NA 134* 135  K 4.0 3.8  CL 105 104  CO2 25 25  GLUCOSE 164* 131*  BUN 28* 16  CREATININE 1.38* 0.99  CALCIUM 8.2* 8.3*   PT/INR No results for input(s): LABPROT, INR in the last 72 hours. CMP     Component Value Date/Time   NA 135 07/04/2021 0029   K 3.8 07/04/2021 0029   CL 104 07/04/2021 0029   CO2 25 07/04/2021 0029   GLUCOSE 131 (H) 07/04/2021 0029   BUN 16 07/04/2021 0029   CREATININE 0.99 07/04/2021 0029   CREATININE 1.18 06/12/2021 1002   CREATININE 1.58 (H) 10/10/2020 0940   CALCIUM 8.3 (L) 07/04/2021 0029   PROT 5.0 (L)  07/01/2021 1530   ALBUMIN 3.1 (L) 07/01/2021 1530   AST 29 07/01/2021 1530   AST 19 06/12/2021 1002   ALT 23 07/01/2021 1530   ALT 14 06/12/2021 1002   ALKPHOS 42 07/01/2021 1530   BILITOT 0.9 07/01/2021 1530   BILITOT 0.7 06/12/2021 1002   GFRNONAA >60 07/04/2021 0029   GFRNONAA >60 06/12/2021 1002   GFRNONAA 42 (L) 10/10/2020 0940   GFRAA 49 (L) 10/10/2020 0940   Lipase     Component Value Date/Time   LIPASE 153 (H) 10/17/2020 1022       Assessment/Plan  75 yo male with pancreatic body adenocarcinoma, POD4 s/p open distal pancreatectomy and splenectomy. - Minimize sedating medications as patient seems to be sensitive to narcotics and has symptoms of delirium at night. Low dose oxycodone. Discontinue morphine and phenergan. - Sleep hygiene, delirium precautions. Awaiting floor bed availability, hopefully patient will sleep better when out of ICU. - Advance to soft diet, SLIV. - Orthostasis: resolved, on home midodrine - Mobilize, PT following, anticipate will need home health PT - IS, pulmonary toilet, home CPAP at night - Drain removed this morning - Diabetes: Continue lantus 30units with novolog sliding scale - VTE: SCDs. Resume home Eliquis today for a-fib, discontinue lovenox. - Dispo: inpatient, awaiting transfer to med-surg floor pending bed availability.    LOS: 4 days    Michaelle Birks, MD [facility] General, Hepatobiliary and Pancreatic Surgery 07/05/21  8:33 AM

## 2021-07-06 LAB — CBC
HCT: 26.3 % — ABNORMAL LOW (ref 39.0–52.0)
Hemoglobin: 8.4 g/dL — ABNORMAL LOW (ref 13.0–17.0)
MCH: 29.3 pg (ref 26.0–34.0)
MCHC: 31.9 g/dL (ref 30.0–36.0)
MCV: 91.6 fL (ref 80.0–100.0)
Platelets: 259 10*3/uL (ref 150–400)
RBC: 2.87 MIL/uL — ABNORMAL LOW (ref 4.22–5.81)
RDW: 15.9 % — ABNORMAL HIGH (ref 11.5–15.5)
WBC: 10.2 10*3/uL (ref 4.0–10.5)
nRBC: 0.6 % — ABNORMAL HIGH (ref 0.0–0.2)

## 2021-07-06 LAB — GLUCOSE, CAPILLARY
Glucose-Capillary: 110 mg/dL — ABNORMAL HIGH (ref 70–99)
Glucose-Capillary: 112 mg/dL — ABNORMAL HIGH (ref 70–99)
Glucose-Capillary: 137 mg/dL — ABNORMAL HIGH (ref 70–99)
Glucose-Capillary: 143 mg/dL — ABNORMAL HIGH (ref 70–99)
Glucose-Capillary: 186 mg/dL — ABNORMAL HIGH (ref 70–99)
Glucose-Capillary: 207 mg/dL — ABNORMAL HIGH (ref 70–99)

## 2021-07-06 MED ORDER — METOPROLOL TARTRATE 5 MG/5ML IV SOLN
5.0000 mg | Freq: Four times a day (QID) | INTRAVENOUS | Status: DC | PRN
  Administered 2021-07-06: 5 mg via INTRAVENOUS
  Filled 2021-07-06: qty 5

## 2021-07-06 NOTE — Progress Notes (Signed)
    5 Days Post-Op  Subjective: No complaints this morning.  Feeling pretty well, thinks he slept okay last night  Objective: Vital signs in last 24 hours: Temp:  [97.4 F (36.3 C)-99.2 F (37.3 C)] 98.8 F (37.1 C) (07/23 0500) Pulse Rate:  [64-101] 82 (07/23 0700) Resp:  [13-25] 17 (07/23 0700) BP: (116-158)/(56-87) 123/76 (07/23 0700) SpO2:  [93 %-99 %] 96 % (07/23 0700) Weight:  [116.9 kg] 116.9 kg (07/23 0512) Last BM Date: 07/05/21  Intake/Output from previous day: 07/22 0701 - 07/23 0700 In: 1076 [P.O.:960; I.V.:116] Out: 1425 [Urine:1425] Intake/Output this shift: No intake/output data recorded.  PE: General: Sitting up in chair resting comfortably, NAD Neuro: sleepy but easily arousable and oriented, no focal deficits Resp: normal work of breathing CV: irregular rhythm Abdomen: soft, nondistended, nontender to palpation. Midline incision clean and dry with no erythema or induration. Extremities: warm and well-perfused  Lab Results:  Recent Labs    07/04/21 0029 07/06/21 0239  WBC 16.2* 10.2  HGB 7.8* 8.4*  HCT 24.5* 26.3*  PLT 123* 259    BMET Recent Labs    07/04/21 0029  NA 135  K 3.8  CL 104  CO2 25  GLUCOSE 131*  BUN 16  CREATININE 0.99  CALCIUM 8.3*    PT/INR No results for input(s): LABPROT, INR in the last 72 hours. CMP     Component Value Date/Time   NA 135 07/04/2021 0029   K 3.8 07/04/2021 0029   CL 104 07/04/2021 0029   CO2 25 07/04/2021 0029   GLUCOSE 131 (H) 07/04/2021 0029   BUN 16 07/04/2021 0029   CREATININE 0.99 07/04/2021 0029   CREATININE 1.18 06/12/2021 1002   CREATININE 1.58 (H) 10/10/2020 0940   CALCIUM 8.3 (L) 07/04/2021 0029   PROT 5.0 (L) 07/01/2021 1530   ALBUMIN 3.1 (L) 07/01/2021 1530   AST 29 07/01/2021 1530   AST 19 06/12/2021 1002   ALT 23 07/01/2021 1530   ALT 14 06/12/2021 1002   ALKPHOS 42 07/01/2021 1530   BILITOT 0.9 07/01/2021 1530   BILITOT 0.7 06/12/2021 1002   GFRNONAA >60 07/04/2021  0029   GFRNONAA >60 06/12/2021 1002   GFRNONAA 42 (L) 10/10/2020 0940   GFRAA 49 (L) 10/10/2020 0940   Lipase     Component Value Date/Time   LIPASE 153 (H) 10/17/2020 1022       Assessment/Plan  75 yo male with pancreatic body adenocarcinoma, POD5 s/p open distal pancreatectomy and splenectomy. - Minimize sedating medications as patient seems to be sensitive to narcotics and has symptoms of delirium at night. Low dose oxycodone. Discontinued morphine and phenergan. - Sleep hygiene, delirium precautions. Awaiting floor bed availability, hopefully patient will sleep better when out of ICU. - Advance to soft diet, SLIV. - Orthostasis: resolved, on home midodrine - Mobilize, PT following, anticipate will need home health PT - IS, pulmonary toilet, home CPAP at night - Drain removed POD4 - Diabetes: Continue lantus 30units with novolog sliding scale - VTE: SCDs. Resume home Eliquis 7/22 for a-fib, discontinue lovenox. Hgb stable today. - Dispo: inpatient, awaiting transfer to med-surg floor pending bed availability.    LOS: 5 days    [facility] 07/06/21 7:39 AM  See amion to page on call provider for nights/weekends if needed.

## 2021-07-07 LAB — GLUCOSE, CAPILLARY
Glucose-Capillary: 189 mg/dL — ABNORMAL HIGH (ref 70–99)
Glucose-Capillary: 157 mg/dL — ABNORMAL HIGH (ref 70–99)
Glucose-Capillary: 124 mg/dL — ABNORMAL HIGH (ref 70–99)
Glucose-Capillary: 216 mg/dL — ABNORMAL HIGH (ref 70–99)
Glucose-Capillary: 95 mg/dL (ref 70–99)
Glucose-Capillary: 86 mg/dL (ref 70–99)

## 2021-07-07 NOTE — Progress Notes (Signed)
    6 Days Post-Op  Subjective: No big complaints this morning.  Did not sleep well last night.  Had some tachycardia overnight which was self-limited.  Objective: Vital signs in last 24 hours: Temp:  [97.7 F (36.5 C)-99 F (37.2 C)] 99 F (37.2 C) (07/24 0341) Pulse Rate:  [83-133] 97 (07/24 0710) Resp:  [13-24] 18 (07/24 0341) BP: (102-158)/(59-88) 112/68 (07/24 0341) SpO2:  [95 %-100 %] 98 % (07/24 0341) Last BM Date: 07/07/21  Intake/Output from previous day: 07/23 0701 - 07/24 0700 In: 917 [P.O.:917] Out: -  Intake/Output this shift: No intake/output data recorded.  PE: General: Resting comfortably in bed Neuro: Alert, calm, no focal deficits Resp: normal work of breathing CV: irregular rhythm Abdomen: soft, nondistended, mildly appropriately tender. Midline incision clean and dry with no erythema or induration.  Serous drainage from JP site. Extremities: warm and well-perfused  Lab Results:  Recent Labs    07/06/21 0239  WBC 10.2  HGB 8.4*  HCT 26.3*  PLT 259    BMET No results for input(s): NA, K, CL, CO2, GLUCOSE, BUN, CREATININE, CALCIUM in the last 72 hours.  PT/INR No results for input(s): LABPROT, INR in the last 72 hours. CMP     Component Value Date/Time   NA 135 07/04/2021 0029   K 3.8 07/04/2021 0029   CL 104 07/04/2021 0029   CO2 25 07/04/2021 0029   GLUCOSE 131 (H) 07/04/2021 0029   BUN 16 07/04/2021 0029   CREATININE 0.99 07/04/2021 0029   CREATININE 1.18 06/12/2021 1002   CREATININE 1.58 (H) 10/10/2020 0940   CALCIUM 8.3 (L) 07/04/2021 0029   PROT 5.0 (L) 07/01/2021 1530   ALBUMIN 3.1 (L) 07/01/2021 1530   AST 29 07/01/2021 1530   AST 19 06/12/2021 1002   ALT 23 07/01/2021 1530   ALT 14 06/12/2021 1002   ALKPHOS 42 07/01/2021 1530   BILITOT 0.9 07/01/2021 1530   BILITOT 0.7 06/12/2021 1002   GFRNONAA >60 07/04/2021 0029   GFRNONAA >60 06/12/2021 1002   GFRNONAA 42 (L) 10/10/2020 0940   GFRAA 49 (L) 10/10/2020 0940    Lipase     Component Value Date/Time   LIPASE 153 (H) 10/17/2020 1022       Assessment/Plan  75 yo male with pancreatic body adenocarcinoma, POD6 s/p open distal pancreatectomy and splenectomy. - Minimize sedating medications as patient seems to be sensitive to narcotics and has symptoms of delirium at night. Low dose oxycodone. Discontinued morphine and phenergan. - Sleep hygiene, delirium precautions. Awaiting floor bed availability, hopefully patient will sleep better now that he is out of ICU. - Advance to soft diet, SLIV. - Orthostasis: resolved, on home midodrine - Mobilize, PT following, anticipate will need home health PT - IS, pulmonary toilet, home CPAP at night - Drain removed POD4 - Diabetes: Continue lantus 30units with novolog sliding scale - VTE: SCDs. Resume home Eliquis 7/22 for a-fib, discontinue lovenox.  Repeat labs tomorrow. - Dispo: inpatient, awaiting transfer to med-surg floor pending bed availability.    LOS: 6 days    [facility] 07/07/21 10:05 AM  See amion to page on call provider for nights/weekends if needed.

## 2021-07-07 NOTE — Progress Notes (Signed)
   07/06/21 2313  Assess: MEWS Score  Temp 97.9 F (36.6 C)  BP 129/65  Pulse Rate (!) 133 (msg sent to Dr Mamie Nick. Stechschulte at 2330)  Resp 18  SpO2 95 %  O2 Device Room Air  Assess: MEWS Score  MEWS Temp 0  MEWS Systolic 0  MEWS Pulse 3  MEWS RR 0  MEWS LOC 0  MEWS Score 3  MEWS Score Color Yellow  Assess: if the MEWS score is Yellow or Red  Were vital signs taken at a resting state? Yes  Focused Assessment Change from prior assessment (see assessment flowsheet)  Early Detection of Sepsis Score *See Row Information* Low  MEWS guidelines implemented *See Row Information* Yes  Treat  MEWS Interventions Administered prn meds/treatments  Pain Scale 0-10  Pain Score 0  Take Vital Signs  Increase Vital Sign Frequency  Yellow: Q 2hr X 2 then Q 4hr X 2, if remains yellow, continue Q 4hrs  Escalate  MEWS: Escalate Yellow: discuss with charge nurse/RN and consider discussing with provider and RRT  Notify: Charge Nurse/RN  Name of Charge Nurse/RN Notified Carla  Date Charge Nurse/RN Notified 07/06/21  Time Charge Nurse/RN Notified 2310  Notify: Provider  Provider Name/Title Dr Louanna Raw  Date Provider Notified 07/06/21  Time Provider Notified 2330  Notification Type Page  Notification Reason Other (Comment) (Afib Rhythm 120's-140's)  Provider response See new orders  Date of Provider Response 07/06/21  Time of Provider Response 2304 (Pt on Midodrine at home, [facility] w/ Md to give metoprolol)

## 2021-07-07 NOTE — Plan of Care (Signed)

## 2021-07-08 LAB — CBC
HCT: 24.8 % — ABNORMAL LOW (ref 39.0–52.0)
Hemoglobin: 7.7 g/dL — ABNORMAL LOW (ref 13.0–17.0)
MCH: 28.2 pg (ref 26.0–34.0)
MCHC: 31 g/dL (ref 30.0–36.0)
MCV: 90.8 fL (ref 80.0–100.0)
Platelets: 362 10*3/uL (ref 150–400)
RBC: 2.73 MIL/uL — ABNORMAL LOW (ref 4.22–5.81)
RDW: 16.1 % — ABNORMAL HIGH (ref 11.5–15.5)
WBC: 9.5 10*3/uL (ref 4.0–10.5)
nRBC: 0.4 % — ABNORMAL HIGH (ref 0.0–0.2)

## 2021-07-08 LAB — BASIC METABOLIC PANEL
Anion gap: 6 (ref 5–15)
BUN: 11 mg/dL (ref 8–23)
CO2: 25 mmol/L (ref 22–32)
Calcium: 8 mg/dL — ABNORMAL LOW (ref 8.9–10.3)
Chloride: 107 mmol/L (ref 98–111)
Creatinine, Ser: 1 mg/dL (ref 0.61–1.24)
GFR, Estimated: >60 mL/min (ref >60)
Glucose, Bld: 81 mg/dL (ref 70–99)
Potassium: 3 mmol/L — ABNORMAL LOW (ref 3.5–5.1)
Sodium: 138 mmol/L (ref 135–145)

## 2021-07-08 LAB — MAGNESIUM: Magnesium: 1.9 mg/dL (ref 1.7–2.4)

## 2021-07-08 LAB — GLUCOSE, CAPILLARY
Glucose-Capillary: 76 mg/dL (ref 70–99)
Glucose-Capillary: 75 mg/dL (ref 70–99)
Glucose-Capillary: 94 mg/dL (ref 70–99)
Glucose-Capillary: 184 mg/dL — ABNORMAL HIGH (ref 70–99)
Glucose-Capillary: 107 mg/dL — ABNORMAL HIGH (ref 70–99)
Glucose-Capillary: 169 mg/dL — ABNORMAL HIGH (ref 70–99)

## 2021-07-08 MED ORDER — POTASSIUM CHLORIDE CRYS ER 20 MEQ PO TBCR
40.0000 meq | EXTENDED_RELEASE_TABLET | Freq: Two times a day (BID) | ORAL | Status: DC
  Administered 2021-07-08 – 2021-07-09 (×3): 40 meq via ORAL
  Filled 2021-07-08 (×3): qty 2

## 2021-07-08 MED ORDER — ENSURE ENLIVE PO LIQD
237.0000 mL | Freq: Three times a day (TID) | ORAL | Status: DC
  Administered 2021-07-08 – 2021-07-09 (×3): 237 mL via ORAL

## 2021-07-08 NOTE — Progress Notes (Signed)
Nutrition Follow-up  DOCUMENTATION CODES:   Non-severe (moderate) malnutrition in context of chronic illness  INTERVENTION:   Add Ensure Enlive po TID, each supplement provides 350 kcal and 20 grams of protein  D/C Boost Breeze  Continue MVI with Minerals  Recommend supplementing potassium  NUTRITION DIAGNOSIS:   Moderate Malnutrition related to chronic illness (pancreatic cancer) as evidenced by energy intake < 75% for > or equal to 1 month, percent weight loss.  Being addressed via diet, supplements  GOAL:   Patient will meet greater than or equal to 90% of their needs  Progressing  MONITOR:   PO intake, Supplement acceptance, Weight trends, I & O's, Labs  REASON FOR ASSESSMENT:   Malnutrition Screening Tool    ASSESSMENT:   Patient with PMH significant for DM, HTN, CKD III, HLD, CAD s/p CABG, prostate cancer, and pancreatic cancer (s/p chemotherapy). Presents this admission for pancreatectomy.  Diet advanced to Soft, Pt ate 50% at breakfast this AM  Pt reports poor appetite/intake with +nausea but no vomiting.   Labs: potassium 3.0 (L) Meds: ss novolog, lantus, MVI, colace  Diet Order:   Diet Order             DIET SOFT Room service appropriate? Yes; Fluid consistency: Thin  Diet effective now                   EDUCATION NEEDS:   Education needs have been addressed  Skin:  Skin Assessment: Skin Integrity Issues: Skin Integrity Issues:: Incisions Incisions: abdomen  Last BM:  7/24  Height:   Ht Readings from Last 1 Encounters:  06/27/21 '5\' 11"'$  (1.803 m)    Weight:   Wt Readings from Last 1 Encounters:  07/06/21 116.9 kg    Ideal Body Weight:     BMI:  Body mass index is 35.94 kg/m.  Estimated Nutritional Needs:   Kcal:  2300-2600 kcal  Protein:  115-130 grams  Fluid:  >/= 2 L/day   Kerman Passey MS, RDN, LDN, CNSC Registered Dietitian III Clinical Nutrition RD Pager and On-Call Pager Number Located in [place]

## 2021-07-08 NOTE — Progress Notes (Signed)
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 PMH - CABG, HTN, DM, afib, PTSD, orthostatic hypotension, prostate CA, agent orange exposure, afib.    PT Comments    Pt was seen for mobility on RW in his room due to PT not having assistance to carry the recliner behind him.  Follow along with him to focus on strength of LE's and gait endurance, but did have to contact nursing to let them know that pt needs bandage changes.  Pt is up in chair and repositioned for comfort. Follow along to mobilize as tolerated, and use care for abd precautions.  Follow Up Recommendations  Home health PT;Supervision for mobility/OOB     Equipment Recommendations  None recommended by PT    Recommendations for Other Services       Precautions / Restrictions Precautions Precautions: Fall Precaution Comments: jp drains Restrictions Weight Bearing Restrictions: No    Mobility  Bed Mobility Overal bed mobility: Needs Assistance             General bed mobility comments: OOB in recliner upon entry    Transfers Overall transfer level: Needs assistance Equipment used: Rolling walker (2 wheeled) Transfers: Sit to/from Stand Sit to Stand: Min assist Stand pivot transfers: Min assist          Ambulation/Gait Ambulation/Gait assistance: Min guard;Min assist Gait Distance (Feet): 90 Feet (60+30) Assistive device: Rolling walker (2 wheeled) Gait Pattern/deviations: Step-through pattern;Decreased stride length;Wide base of support Gait velocity: reduced Gait velocity interpretation: <1.31 ft/sec, indicative of household ambulator General Gait Details: seated rest during gait after 60' and close guard of chair   Stairs             Wheelchair Mobility     Modified Rankin (Stroke Patients Only)       Balance Overall balance assessment: Needs assistance Sitting-balance support: Feet supported Sitting balance-Leahy Scale: Fair     Standing balance support: Bilateral upper extremity supported;During functional activity Standing balance-Leahy Scale: Poor                              Cognition Arousal/Alertness: Awake/alert Behavior During Therapy: Flat affect;WFL for tasks assessed/performed Overall Cognitive Status: Within Functional Limits for tasks assessed                                 General Comments: pt is aware of what is going on around him and did not report talking to or seeing someone who was not there      Exercises General Exercises - Lower Extremity Ankle Circles/Pumps: AAROM;5 reps Long Arc Quad: Strengthening;10 reps Heel Slides: Strengthening;10 reps Hip ABduction/ADduction: Strengthening;10 reps    General Comments General comments (skin integrity, edema, etc.): pt is tired when PT arrived, agreed to therapy but reports he wants to take a nap      Pertinent Vitals/Pain Pain Assessment: Faces Faces Pain Scale: Hurts little more Pain Location: abdomen Pain Descriptors / Indicators: Operative site guarding Pain Intervention(s): Limited activity within patient's tolerance;Monitored during session;Premedicated before session;Repositioned    Home Living                      Prior Function  PT Goals (current goals can now be found in the care plan section) Acute Rehab PT Goals Patient Stated Goal: getting home Progress towards PT goals: Progressing toward goals    Frequency    Min 3X/week      PT Plan Current plan remains appropriate    Co-evaluation              AM-PAC PT "6 Clicks" Mobility   Outcome Measure  Help needed turning from your back to your side while in a flat bed without using bedrails?: A Little Help needed moving from  lying on your back to sitting on the side of a flat bed without using bedrails?: A Little Help needed moving to and from a bed to a chair (including a wheelchair)?: A Little Help needed standing up from a chair using your arms (e.g., wheelchair or bedside chair)?: A Little Help needed to walk in hospital room?: A Little Help needed climbing 3-5 steps with a railing? : A Lot 6 Click Score: 17    End of Session   Activity Tolerance: Patient limited by fatigue;Treatment limited secondary to medical complications (Comment) Patient left: in chair;with call bell/phone within reach;with chair alarm set Nurse Communication: Mobility status PT Visit Diagnosis: Other abnormalities of gait and mobility (R26.89);Difficulty in walking, not elsewhere classified (R26.2)     Time: [id number] PT Time Calculation (min) (ACUTE ONLY): 27 min  Charges:  $Gait Training: 8-22 mins $Therapeutic Exercise: 8-22 mins                 Ramond Dial 07/08/2021, 4:06 PM  Mee Hives, PT MS Acute Rehab Dept. Number: [place] [facility] [place]

## 2021-07-08 NOTE — Discharge Instructions (Addendum)
[facility] DISCHARGE INSTRUCTIONS  Activity No heavy lifting greater than 10 pounds for 6 weeks after surgery. Ok to shower, but do not bathe or submerge incision underwater. Do not drive while taking narcotic pain medication. Be sure to walk around your home at least 3 times daily. This will help avoid blood clots and will improve your strength.  Wound Care Your incision is covered with skin glue called Dermabond. This will peel off on its own over time. You may shower and allow warm soapy water to run over your incision. Gently pat dry. Do not submerge your incision underwater. Monitor your incision for any new redness, tenderness, or drainage.  Medications You may take Tylenol 4 times daily for pain. If you have more severe pain that is not covered by Tylenol, you may take oxycodone up to every 4 hours as needed. Please reserve this medication only for the most severe pain. You should decrease your Lantus dose to 30 units daily. Please check your blood sugars at least 3 times daily. You should follow up with your primary care doctor within 2 weeks after discharge from the hospital to check your blood sugars and determine if your Lantus dose needs to be adjusted.  Diet You may resume a regular diet. You may notice that you feel full early after meals, which is common and will get better over time. It is better to consume small frequent meals throughout the day rather than 3 large meals. Avoid drinking a lot of carbonated beverages, as these can worsen symptoms of nausea and reflux. If you are having frequent vomiting after meals or are unable to keep down any food or drink, please call the office right away.  When to Call us: Fever greater than 100.5 New redness, drainage, or swelling at incision site Severe pain, nausea, or vomiting Excessive vomiting or inability to keep down any liquids Low blood sugar or very high blood sugar (greater than 300) Jaundice (yellowing  of the whites of the eyes or skin)  Follow-up You have an appointment scheduled with Dr. Zenia Resides on July 23, 2021 at 9:30am. This will be at the [facility] office at [address], [place], [place], [place]. Please arrive at least 15 minutes prior to your scheduled appointment time.  For questions or concerns, please call the office at [phone].     Information on my medicine - ELIQUIS (apixaban)  Why was Eliquis prescribed for you? Eliquis was prescribed for you to reduce the risk of a blood clot forming that can cause a stroke if you have a medical condition called atrial fibrillation (a type of irregular heartbeat).  What do You need to know about Eliquis ? Take your Eliquis TWICE DAILY - one tablet in the morning and one tablet in the evening with or without food. If you have difficulty swallowing the tablet whole please discuss with your pharmacist how to take the medication safely.  Take Eliquis exactly as prescribed by your doctor and DO NOT stop taking Eliquis without talking to the doctor who prescribed the medication.  Stopping may increase your risk of developing a stroke.  Refill your prescription before you run out.  After discharge, you should have regular check-up appointments with your healthcare provider that is prescribing your Eliquis.  In the future your dose may need to be changed if your kidney function or weight changes by a significant amount or as you get older.  What do you do if you miss  a dose? If you miss a dose, take it as soon as you remember on the same day and resume taking twice daily.  Do not take more than one dose of ELIQUIS at the same time to make up a missed dose.  Important Safety Information A possible side effect of Eliquis is bleeding. You should call your healthcare provider right away if you experience any of the following: Bleeding from an injury or your nose that does not stop. Unusual colored urine (red or  dark brown) or unusual colored stools (red or black). Unusual bruising for unknown reasons. A serious fall or if you hit your head (even if there is no bleeding).  Some medicines may interact with Eliquis and might increase your risk of bleeding or clotting while on Eliquis. To help avoid this, consult your healthcare provider or pharmacist prior to using any new prescription or non-prescription medications, including herbals, vitamins, non-steroidal anti-inflammatory drugs (NSAIDs) and supplements.  This website has more information on Eliquis (apixaban): http://www.eliquis.com/eliquis/home

## 2021-07-08 NOTE — Progress Notes (Signed)
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 PMH - CABG, HTN, DM, afib, PTSD, orthostatic hypotension, prostate CA, agent orange exposure, afib.   OT comments  [name] is progressing well towards his goals, he reports that he feels as if he is performing close to his baseline despite the pain. Pt completed all functional mobility in the room with min guard for safety and vc for hand placement on RW during transitional changes. Pt was abel to complete grooming tasks in standing with close min guard without apparent LOB. Pt continues to benefit from continued OT acutely to progress function in all ADLs and mobility. D/c plan remain appropriate.    Follow Up Recommendations  Home health OT;Supervision/Assistance - 24 hour    Equipment Recommendations  3 in 1 bedside commode       Precautions / Restrictions Precautions Precautions: Fall Precaution Comments: jp drains Restrictions Weight Bearing Restrictions: No       Mobility Bed Mobility Overal bed mobility: Needs Assistance Bed Mobility: Supine to Sit     Supine to sit: Min guard;HOB elevated     General bed mobility comments: pt elevated HOB in order to assist imself to sitting, pt educated to practive sup>sit transfer while flat to prepare for d/c home    Transfers Overall transfer level: Needs assistance Equipment used: Rolling walker (2 wheeled) Transfers: Sit to/from Stand Sit to Stand: Min guard Stand pivot transfers: Min assist       General transfer comment: min guard for safety, vc for hand placement    Balance Overall balance assessment: Needs assistance Sitting-balance support: Feet supported Sitting balance-Leahy Scale: Good     Standing balance support: Single extremity  supported;During functional activity Standing balance-Leahy Scale: Fair Standing balance comment: pt able to stand at sink and use 1 UE for functional task while the other UE was supporte on external durface                           ADL either performed or assessed with clinical judgement   ADL Overall ADL's : Needs assistance/impaired     Grooming: Wash/dry hands;Wash/dry face;Supervision/safety;Standing                   Toilet Transfer: Min guard;Ambulation;RW   Toileting- Clothing Manipulation and Hygiene: Supervision/safety;Cueing for safety;Sit to/from stand       Functional mobility during ADLs: Min guard;Rolling walker;Cueing for safety General ADL Comments: Pt observed pulling from RW to stand several times and seemingly ignoring vc to place hand on surface prior to standing.      Cognition Arousal/Alertness: Awake/alert Behavior During Therapy: WFL for tasks assessed/performed Overall Cognitive Status: Within Functional Limits for tasks assessed           General Comments: pt reports not being hungry or eatign much, states he is not sleeping well        Exercises Exercises: General Lower Extremity General Exercises - Lower Extremity Ankle Circles/Pumps: AAROM;5 reps Long Arc Quad: Strengthening;10 reps Heel Slides: Strengthening;10 reps Hip ABduction/ADduction: Strengthening;10 reps      General Comments VSS on RA, pt toileted and groomed in bathroom this session    Pertinent Vitals/ Pain       Pain Assessment: Faces Faces Pain Scale: Hurts a little bit  Pain Location: abdomen Pain Descriptors / Indicators: Operative site guarding Pain Intervention(s): Monitored during session   Frequency  Min 2X/week        Progress Toward Goals  OT Goals(current goals can now be found in the care plan section)  Progress towards OT goals: Progressing toward goals  Acute Rehab OT Goals Patient Stated Goal: getting home OT Goal  Formulation: With patient Time For Goal Achievement: 07/17/21 Potential to Achieve Goals: Good ADL Goals Pt Will Perform Grooming: with supervision;standing Pt Will Perform Lower Body Dressing: with min guard assist;sitting/lateral leans;sit to/from stand;with adaptive equipment Pt Will Transfer to Toilet: with supervision;ambulating;bedside commode Pt Will Perform Toileting - Clothing Manipulation and hygiene: with supervision;sit to/from stand Pt/caregiver will Perform Home Exercise Program: Increased strength;Both right and left upper extremity;With written HEP provided  Plan Discharge plan remains appropriate;Frequency remains appropriate       AM-PAC OT "6 Clicks" Daily Activity     Outcome Measure   Help from another person eating meals?: A Little Help from another person taking care of personal grooming?: A Little Help from another person toileting, which includes using toliet, bedpan, or urinal?: A Little Help from another person bathing (including washing, rinsing, drying)?: A Lot Help from another person to put on and taking off regular upper body clothing?: A Little Help from another person to put on and taking off regular lower body clothing?: A Lot 6 Click Score: 16    End of Session Equipment Utilized During Treatment: Gait belt;Rolling walker  OT Visit Diagnosis: Other abnormalities of gait and mobility (R26.89);Muscle weakness (generalized) (M62.81);Dizziness and giddiness (R42);Pain   Activity Tolerance Patient tolerated treatment well   Patient Left in chair;with chair alarm set   Nurse Communication Mobility status;Precautions        Time: 6962-9528 OT Time Calculation (min): 18 min  Charges: OT General Charges $OT Visit: 1 Visit OT Treatments $Self Care/Home Management : 8-22 mins     Ziyon Soltau A Disaya Walt 07/08/2021, 4:43 PM

## 2021-07-08 NOTE — Plan of Care (Signed)

## 2021-07-08 NOTE — Progress Notes (Signed)
    7 Days Post-Op  Subjective: No acute changes. Reports nausea with poor intake but no vomiting. Ambulating to the bathroom but otherwise has not been walking. Afebrile, vitals stable. Labs unremarkable.  Objective: Vital signs in last 24 hours: Temp:  [98.5 F (36.9 C)-98.9 F (37.2 C)] 98.7 F (37.1 C) (07/25 0313) Pulse Rate:  [79-83] 80 (07/25 0313) Resp:  [17-18] 17 (07/25 0313) BP: (129-176)/(66-72) 176/71 (07/25 0606) SpO2:  [99 %-100 %] 99 % (07/25 0313) Last BM Date: 07/07/21  Intake/Output from previous day: 07/24 0701 - 07/25 0700 In: 120 [P.O.:120] Out: 1 [Stool:1] Intake/Output this shift: No intake/output data recorded.  PE: General: Resting comfortably in bed Neuro: Alert, calm, no focal deficits Resp: normal work of breathing Abdomen: soft, nondistended, nontender. Midline incision clean and dry with no erythema or induration. JP site clean and dry. Extremities: warm and well-perfused  Lab Results:  Recent Labs    07/06/21 0239 07/08/21 0111  WBC 10.2 9.5  HGB 8.4* 7.7*  HCT 26.3* 24.8*  PLT 259 362   BMET Recent Labs    07/08/21 0111  NA 138  K 3.0*  CL 107  CO2 25  GLUCOSE 81  BUN 11  CREATININE 1.00  CALCIUM 8.0*   PT/INR No results for input(s): LABPROT, INR in the last 72 hours. CMP     Component Value Date/Time   NA 138 07/08/2021 0111   K 3.0 (L) 07/08/2021 0111   CL 107 07/08/2021 0111   CO2 25 07/08/2021 0111   GLUCOSE 81 07/08/2021 0111   BUN 11 07/08/2021 0111   CREATININE 1.00 07/08/2021 0111   CREATININE 1.18 06/12/2021 1002   CREATININE 1.58 (H) 10/10/2020 0940   CALCIUM 8.0 (L) 07/08/2021 0111   PROT 5.0 (L) 07/01/2021 1530   ALBUMIN 3.1 (L) 07/01/2021 1530   AST 29 07/01/2021 1530   AST 19 06/12/2021 1002   ALT 23 07/01/2021 1530   ALT 14 06/12/2021 1002   ALKPHOS 42 07/01/2021 1530   BILITOT 0.9 07/01/2021 1530   BILITOT 0.7 06/12/2021 1002   GFRNONAA >60 07/08/2021 0111   GFRNONAA >60 06/12/2021 1002    GFRNONAA 42 (L) 10/10/2020 0940   GFRAA 49 (L) 10/10/2020 0940   Lipase     Component Value Date/Time   LIPASE 153 (H) 10/17/2020 1022       Assessment/Plan  75 yo male with pancreatic body adenocarcinoma, POD7 s/p open distal pancreatectomy and splenectomy. - Multimodal pain control - Sleep hygiene, delirium precautions. - Continue soft diet - Needs to ambulate more frequently, encouraged to walk in halls today. PT following. - IS, pulmonary toilet, home CPAP at night - Diabetes: Continue lantus 30units with novolog sliding scale, blood sugar well-controlled. - VTE: SCDs, home Eliquis for a-fib - Dispo: inpatient, med-surg. Tentative discharge home tomorrow if ambulating and cleared by PT.   LOS: 7 days   Michaelle Birks, MD [facility] Surgery General, Hepatobiliary and Pancreatic Surgery 07/08/21 7:24 AM

## 2021-07-09 DIAGNOSIS — Z23 Encounter for immunization: Secondary | ICD-10-CM | POA: Diagnosis present

## 2021-07-09 LAB — GLUCOSE, CAPILLARY
Glucose-Capillary: 116 mg/dL — ABNORMAL HIGH (ref 70–99)
Glucose-Capillary: 115 mg/dL — ABNORMAL HIGH (ref 70–99)
Glucose-Capillary: 164 mg/dL — ABNORMAL HIGH (ref 70–99)

## 2021-07-09 MED ORDER — INSULIN GLARGINE 100 UNIT/ML ~~LOC~~ SOLN: 30.0000 [IU] | Freq: Every day | SUBCUTANEOUS | 3 refills | Status: DC

## 2021-07-09 MED ORDER — MENINGOCOCCAL A C Y&W-135 OLIG IM SOLR
0.5000 mL | Freq: Once | INTRAMUSCULAR | Status: AC
  Administered 2021-07-09: 0.5 mL via INTRAMUSCULAR
  Filled 2021-07-09 (×4): qty 0.5

## 2021-07-09 MED ORDER — MENINGOCOCCAL VAC B (OMV) IM SUSY
0.5000 mL | PREFILLED_SYRINGE | Freq: Once | INTRAMUSCULAR | Status: DC
  Filled 2021-07-09: qty 0.5

## 2021-07-09 MED ORDER — OXYCODONE HCL 5 MG PO TABS: 5.0000 mg | ORAL_TABLET | ORAL | 0 refills | Status: DC | PRN

## 2021-07-09 MED ORDER — MENINGOCOCCAL VAC B (OMV) IM SUSY
0.5000 mL | PREFILLED_SYRINGE | Freq: Once | INTRAMUSCULAR | Status: AC
  Administered 2021-07-09: 0.5 mL via INTRAMUSCULAR
  Filled 2021-07-09 (×3): qty 0.5

## 2021-07-09 MED ORDER — MENINGOCOCCAL A C Y&W-135 OLIG IM SOLR
0.5000 mL | Freq: Once | INTRAMUSCULAR | Status: DC
  Filled 2021-07-09: qty 0.5

## 2021-07-09 MED ORDER — DOCUSATE SODIUM 100 MG PO CAPS: 100.0000 mg | ORAL_CAPSULE | Freq: Two times a day (BID) | ORAL | 0 refills | Status: AC

## 2021-07-09 MED ORDER — LIDOCAINE HCL (PF) 2 % IJ SOLN
INTRAMUSCULAR | Status: AC
  Filled 2021-07-09: qty 10

## 2021-07-09 MED ORDER — LIDOCAINE HCL (PF) 1 % IJ SOLN: 5.0000 mL | Freq: Once | INTRAMUSCULAR | Status: DC

## 2021-07-09 MED ORDER — ACETAMINOPHEN 325 MG PO TABS: 650.0000 mg | ORAL_TABLET | Freq: Four times a day (QID) | ORAL | Status: DC | PRN

## 2021-07-09 MED ORDER — LIDOCAINE HCL (CARDIAC) PF 100 MG/5ML IV SOSY
PREFILLED_SYRINGE | INTRAVENOUS | Status: AC
  Filled 2021-07-09: qty 5

## 2021-07-09 NOTE — TOC Transition Note (Signed)
Transition of Care [facility]) - CM/SW Discharge Note   Patient Details  Name: [name] MRN: [id number] Date of Birth: 1946-02-10  [facility] of [facility]) CM/SW Contact:  Sharin Mons, RN Phone Number: 07/09/2021, 8:32 AM   Clinical Narrative:    Patient will DC to: home Anticipated DC date: 07/09/2021 Family notified: wife Transport by: car  Admitted with pancreaticcarcinoma, s/p pancreatectomy with en bloc splenectomy on 7/18.  Per MD patient ready for DC today. RN, patient,and patient's family aware of DC.Orders noted for home health services. Pt agreeable. Pt with preference. Referral made [facility] and [facility], [facility] with 48 hrs post d/c. Pt without Rx med concerns.  Post hospital f/u noted on AVS with PCP.  RNCM will sign off for now as intervention is no longer needed. Please consult [place] again if new needs arise.    Final next level of care: Home w Home Health Services Barriers to Discharge: No Barriers Identified   Patient Goals and CMS Choice     Choice offered to / list presented to : Patient  Discharge Placement                       Discharge Plan and Services                          HH Arranged: PT, OT [facility] Agency: [place] Date [place]: 07/09/21 Time [place]: [phone] Representative spoke with at [place]: [place] ([place]) Interventions     Readmission Risk Interventions No flowsheet data found.

## 2021-07-09 NOTE — Discharge Summary (Signed)
Physician Discharge Summary  Patient ID: [name] MRN: [id number] DOB/AGE: 1946-11-02 75 y.o.  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 He underwent 6 cycles of neoadjuvant gemcitabine/abraxane and restaging scans showed no disease progression or evidence of distant metastases. He was scheduled for surgery and taken to the OR on 7/18 for a staging laparoscopy, with open distal pancreatectomy and splenectomy. Please see separately dictated operative note for further details of this procedure. Postoperatively he was admitted to the ICU in stable condition. NG tube was removed on POD1.  He had a mild AKI on POD1, which had resolved on POD2. Foley was removed and he was able to void spontaneously. He had multiple episodes of orthostasis during the first few postoperative days, and his home midodrine was resumed. His diet was slowly advanced to clear liquids and then a soft diet. He had intermittent nausea but no vomiting. He was transferred out of the ICU to a med-surg floor on POD4. His POD3 drain amylase was low and his surgical drain was removed on POD4. Home Eliquis was resume for his history of a-fib and hemoglobin remained stable. He worked with physical and occupational therapies and was able to ambulate with the assistance of a walker. His blood glucose was well-controlled on 30 units of Lantus daily, which is half of his usual home dose.  On POD8, he was ambulating, tolerating a regular diet, having regular bowel function, and pain  was well-controlled on oral pain medications. He was examined and deemed appropriate for discharge home with home health PT/OT. His wife and son will be at home with him to assist him at all times. He was given meningococcal booster vaccines on the day of discharge for asplenia (Prevnar and Hib vaccines are up to date). He was instructed to continue Lantus at 30 units daily and to follow up with his PCP within 2 weeks, as Lantus dosing will likely need to be increased as his PO intake improves.  Surgical Pathology: FINAL MICROSCOPIC DIAGNOSIS:   A. PANCREATIC NECK MARGIN:  - Benign pancreatic tissue.   B. PANCREATIC NECK MARGIN:  - Benign pancreatic tissue.   C. PANCREAS DISTAL AND SPLEEN:  - Adenocarcinoma, poorly differentiated, spanning 0.8 cm.  - Margin is negative.  - Six of six lymph nodes negative for carcinoma (0/6).  - PanIN-1B.  - Fibrosis, chronic inflammation, and reactive changes.  - Benign spleen and splenule.  - See oncology table.   D. OMENTUM:  - Benign fibroadipose tissue.   ONCOLOGY TABLE:   PANCREAS (EXOCRINE), CARCINOMA: Resection   Procedure: Distal pancreatectomy and splenectomy  Tumor Site: Tail  Tumor Size: 0.8 cm  Histologic Type: Adenocarcinoma  Histologic Grade: G3, poorly differentiated, see comment.  Tumor Extension: Limited pancreas  Treatment Effect: Residual cancer with evident tumor regression, with  more than single cells or rare small groups of cancer cells (partial  response) TRS 2  Lymphovascular Invasion: Not identified  Perineural Invasion: Not identified  Margins:  Margin Status for Invasive Carcinoma: All margins negative for invasive  carcinoma  Margin Status for Dysplasia  and Intraepithelial Neoplasia: All margins  negative for dysplasia and intraepithelial neoplasia  Regional Lymph Nodes:       Number of Lymph Nodes with Tumor: 0       Number of Lymph Nodes Examined: 6  Distant Metastasis:       Distant Site(s) Involved: Not  applicable  Pathologic Stage Classification (pTNM, AJCC 8th Edition): ypT1b, ypN0   Consults: None  Significant Diagnostic Studies: N/A  Treatments: IV hydration, analgesia: acetaminophen, Dilaudid, and oxycodone, therapies: PT and OT, and surgery: open distal pancreatectomy and en bloc splenectomy  Discharge Exam: Blood pressure (!) 159/83, pulse 88, temperature 97.7 F (36.5 C), temperature source Oral, resp. rate 17, weight 116.9 kg, SpO2 97 %. General appearance: alert, cooperative, and appears stated age Eyes: no scleral icterus Neck: supple, symmetrical, trachea midline Resp: normal work of breathing on room air GI: soft, non-tender; bowel sounds normal; no masses,  no organomegaly Extremities: extremities normal, atraumatic, no cyanosis or edema Neurologic: Grossly normal Incision/Wound: midline incision clean, dry and in tact with no erythema or induration. Drain site with scant serous drainage, suture in place.  Disposition: Discharge disposition: 06-Home-Health Care Svc      Discharge Instructions     Call MD for:  persistant nausea and vomiting   Complete by: As directed    Call MD for:  redness, tenderness, or signs of infection (pain, swelling, redness, odor or green/yellow discharge around incision site)   Complete by: As directed    Call MD for:  severe uncontrolled pain   Complete by: As directed    Call MD for:  temperature >100.4   Complete by: As directed    Diet Carb Modified   Complete by: As directed    Driving Restrictions   Complete by: As directed    Do not drive until your follow up appointment with Dr. Zenia Resides.   Increase activity slowly   Complete by: As directed    Lifting restrictions   Complete by: As directed    10 pounds for 6 weeks      Allergies as of 07/09/2021       Reactions   Furosemide Other (See Comments)   Unknown   Keflex [cephalexin] Other (See Comments)   Hallucinations?   Lisinopril Other (See Comments)   Unknown    Terazosin Other (See Comments)   Unknown        Medication List     STOP taking these medications    doxycycline 100 MG tablet Commonly known as: VIBRA-TABS       TAKE these medications    acetaminophen 325 MG tablet Commonly known as: TYLENOL Take 2 tablets (650 mg total) by mouth every 6 (six) hours as needed for mild pain.   atorvastatin 80 MG tablet Commonly known as: LIPITOR Take 40 mg by mouth at bedtime.   BENEFIBER PO Take 1 tablet by mouth daily.   calcium-vitamin D 500-200 MG-UNIT tablet Commonly known as: OSCAL WITH D Take 1 tablet by mouth 2 (two) times daily.   clotrimazole-betamethasone cream Commonly known as: LOTRISONE Apply 1 application topically daily.   cycloSPORINE 0.05 % ophthalmic emulsion Commonly known as: RESTASIS Place 1 drop into both eyes 2 (two) times daily as needed (dry eyes).   docusate sodium 100 MG capsule Commonly known as: COLACE Take 1 capsule (100 mg total) by mouth 2 (two) times daily.   Eliquis 5 MG Tabs tablet Generic drug: apixaban Take 5 mg by mouth 2 (two) times daily.  ferrous sulfate 325 (65 FE) MG tablet Take 325 mg by mouth daily with breakfast.   FLUoxetine 20 MG capsule Commonly known as: PROZAC Take 40 mg by mouth daily.   insulin glargine 100 UNIT/ML injection Commonly known as: LANTUS Inject 0.3 mLs (30 Units total) into the skin daily. What changed: how much to take   lidocaine-prilocaine cream Commonly known as: EMLA Apply to port site 1-2 hours prior to use   midodrine 5 MG tablet Commonly known as: PROAMATINE Take 5 mg by mouth in the morning, at noon, and at bedtime.   Multi-Vitamins Tabs Take 1 tablet by mouth daily.   nitroGLYCERIN 0.4 MG SL tablet Commonly known as: NITROSTAT Place 0.4 mg under the tongue every 5 (five) minutes as needed for chest pain.   oxyCODONE 5 MG immediate release tablet Commonly known as: Oxy IR/ROXICODONE Take 1 tablet (5 mg total) by mouth every 4  (four) hours as needed for severe pain.   prochlorperazine 10 MG tablet Commonly known as: COMPAZINE Take 1 tablet (10 mg total) by mouth every 6 (six) hours as needed for nausea or vomiting.   tamsulosin 0.4 MG Caps capsule Commonly known as: FLOMAX Take 0.8 mg by mouth at bedtime.   traZODone 100 MG tablet Commonly known as: DESYREL Take 100 mg by mouth at bedtime.   URIBEL PO Take by mouth.   vitamin B-12 500 MCG tablet Commonly known as: CYANOCOBALAMIN Take 500 mcg by mouth daily.       ASK your doctor about these medications    fluticasone 50 MCG/ACT nasal spray Commonly known as: FLONASE INSTILL 2 SPRAYS IN EACH NOSTRIL EVERY EVENING   nystatin powder Commonly known as: MYCOSTATIN/NYSTOP Apply 1 application topically 3 (three) times daily.   pantoprazole 40 MG tablet Commonly known as: Protonix Take 1 tablet (40 mg total) by mouth daily.         Signed: Dwan Bolt 07/09/2021, 8:25 AM

## 2021-07-09 NOTE — Progress Notes (Signed)
[name] to be D/C'd  per MD order.  Discussed with the patient and all questions fully answered.  VSS, Skin clean, dry and intact without evidence of skin break down, no evidence of skin tears noted.  IV catheter discontinued intact. Site without signs and symptoms of complications. Dressing and pressure applied.  An After Visit Summary was printed and given to the patient. Patient received prescription.  D/c education completed with patient/family including follow up instructions, medication list, d/c activities limitations if indicated, with other d/c instructions as indicated by MD - patient able to verbalize understanding, all questions fully answered.   Patient instructed to return to ED, call 911, or call MD for any changes in condition.   Patient to be escorted via [place], and D/C home via private auto.

## 2021-07-09 NOTE — Progress Notes (Signed)
Patient ready for discharge home today. Drain site was leaking serous fluid, 3-0 Nylon horizontal mattress suture was placed to close the site after injecting 3 mL 2% lidocaine for anesthetic. Patient tolerated the procedure well.  Immunization record reviewed, patient is up to date on Prevnar and Hib vaccines but is due for meningococcal boosters, which have been ordered to be given prior to discharge. I spoke with his wife via phone and reviewed his pathology results and discharge plan. Patient has postop visit scheduled on August 9.

## 2021-07-09 NOTE — Progress Notes (Signed)
PT Cancellation Note  Patient Details Name: [name] MRN: [id number] DOB: [birth year]   Cancelled Treatment:    Reason Eval/Treat Not Completed: Patient declined, no reason specified. Patient politely declines PT session. Reports he is going home today. Has everything he needs. Will continue to follow until discharge.     Cieanna Stormes 07/09/2021, 10:20 AM

## 2021-07-10 DIAGNOSIS — F4310 Post-traumatic stress disorder, unspecified: Secondary | ICD-10-CM | POA: Diagnosis not present

## 2021-07-10 DIAGNOSIS — C251 Malignant neoplasm of body of pancreas: Secondary | ICD-10-CM | POA: Diagnosis not present

## 2021-07-10 DIAGNOSIS — I4891 Unspecified atrial fibrillation: Secondary | ICD-10-CM | POA: Diagnosis not present

## 2021-07-10 DIAGNOSIS — Z90411 Acquired partial absence of pancreas: Secondary | ICD-10-CM | POA: Diagnosis not present

## 2021-07-10 DIAGNOSIS — Z483 Aftercare following surgery for neoplasm: Secondary | ICD-10-CM | POA: Diagnosis not present

## 2021-07-10 DIAGNOSIS — Z8546 Personal history of malignant neoplasm of prostate: Secondary | ICD-10-CM | POA: Diagnosis not present

## 2021-07-10 DIAGNOSIS — Z7901 Long term (current) use of anticoagulants: Secondary | ICD-10-CM | POA: Diagnosis not present

## 2021-07-10 DIAGNOSIS — I951 Orthostatic hypotension: Secondary | ICD-10-CM | POA: Diagnosis not present

## 2021-07-10 DIAGNOSIS — Z951 Presence of aortocoronary bypass graft: Secondary | ICD-10-CM | POA: Diagnosis not present

## 2021-07-10 DIAGNOSIS — Z9081 Acquired absence of spleen: Secondary | ICD-10-CM | POA: Diagnosis not present

## 2021-07-10 DIAGNOSIS — Z9181 History of falling: Secondary | ICD-10-CM | POA: Diagnosis not present

## 2021-07-10 DIAGNOSIS — E1129 Type 2 diabetes mellitus with other diabetic kidney complication: Secondary | ICD-10-CM | POA: Diagnosis not present

## 2021-07-10 DIAGNOSIS — E440 Moderate protein-calorie malnutrition: Secondary | ICD-10-CM | POA: Diagnosis not present

## 2021-07-10 DIAGNOSIS — I10 Essential (primary) hypertension: Secondary | ICD-10-CM | POA: Diagnosis not present

## 2021-07-10 DIAGNOSIS — Z794 Long term (current) use of insulin: Secondary | ICD-10-CM | POA: Diagnosis not present

## 2021-07-11 DIAGNOSIS — I4891 Unspecified atrial fibrillation: Secondary | ICD-10-CM | POA: Diagnosis not present

## 2021-07-11 DIAGNOSIS — E440 Moderate protein-calorie malnutrition: Secondary | ICD-10-CM | POA: Diagnosis not present

## 2021-07-11 DIAGNOSIS — E1129 Type 2 diabetes mellitus with other diabetic kidney complication: Secondary | ICD-10-CM | POA: Diagnosis not present

## 2021-07-11 DIAGNOSIS — Z483 Aftercare following surgery for neoplasm: Secondary | ICD-10-CM | POA: Diagnosis not present

## 2021-07-11 DIAGNOSIS — C251 Malignant neoplasm of body of pancreas: Secondary | ICD-10-CM | POA: Diagnosis not present

## 2021-07-11 DIAGNOSIS — I10 Essential (primary) hypertension: Secondary | ICD-10-CM | POA: Diagnosis not present

## 2021-07-12 ENCOUNTER — Other Ambulatory Visit: Payer: Self-pay

## 2021-07-12 NOTE — Progress Notes (Signed)
HPI: Mr.Cale Guilford Sproles is a 75 y.o. male, who is here today with his wife to follow on recent hospitalization.  He was last seen on 06/04/2021. He was admitted on 07/02/2021 for elective surgical procedure and discharged home on 07/09/2021. Adenocarcinoma of the body of the pancreas. He was taken to the OR on 07/01/2021 for restaging laparoscopy with open distal pancreatectomy and splenectomy. Recovering well. He has not noted wound erythema or drainage. Next appt with surgeon 07/21/21. Appt with oncologist next week. Appetite at his baseline.  Orthostatic hypotension: He had multiple episodes of orthostasis during the first few postoperative days and midodrine 5 mg 3 times daily was resumed at the time of his discharge. He is concerned about elevated BP's at home. SBP's 150-160.  He will received meningococcal booster vaccine at the time of the discharge.  Prevnar and Hib vaccines are up-to-date. BP mildly elevated at the time of discharge, 159/83. He has not had lightheadedness when getting up.  HypoK+, he is not on K+ supplementation.  Lab Results  Component Value Date   CREATININE 1.00 07/08/2021   BUN 11 07/08/2021   NA 138 07/08/2021   K 3.0 (L) 07/08/2021   CL 107 07/08/2021   CO2 25 07/08/2021   3-4 days ago epigastric abdominal pain radiated to mid sternal and chest. No associated symptoms. Achy mild pain,gradual and at rest.  GERD: He is not on Pantoprazole since 01/2021. Follows with GI, Dr Rush Landmark. EGD 01/15/21:   No gross lesions were noted in the entire esophagus.      The Z-line was irregular and was found 42 cm from the incisors.      Diffuse moderate inflammation characterized by erosions, erythema,friability and granularity was found in the entire examined  DM II: Lantus was decreased from 50 U to 30 U. He is concerned about elevated BS's and would like to go back to 50 U. BS's fasting 150-200 Pm 120-260. Negative for polydipsia,polyuria, or  polyphagia.  3 episodes since hospital d/c of nausea and vomiting x 1 after breakfast. He had whole milk once, so it was exacerbating factors but symptoms still present when eating his typical breakfast. He has not check BP or BS's during episodes. No associated abdominal pain or changes in bowel habits.  Anemia: He has not noted melena,blood in stool,or gross hematuria. He is on Fe sulfate 325 mg daily.  Chronic anticoagulation on Eliquis 5 mg bid,atrial fib.  Lab Results  Component Value Date   WBC 9.5 07/08/2021   HGB 7.7 (L) 07/08/2021   HCT 24.8 (L) 07/08/2021   MCV 90.8 07/08/2021   PLT 362 07/08/2021   Prostate cancer, next appt with urologist 07/22/21.  Review of Systems  Constitutional:  Positive for fatigue. Negative for appetite change and fever.  HENT:  Negative for mouth sores, nosebleeds, sore throat and trouble swallowing.   Eyes:  Negative for redness and visual disturbance.  Respiratory:  Negative for cough, shortness of breath and wheezing.   Cardiovascular:  Negative for palpitations.  Genitourinary:  Negative for decreased urine volume and dysuria.  Musculoskeletal:  Positive for arthralgias, back pain and gait problem.  Skin:  Negative for rash.  Neurological:  Negative for syncope, weakness and headaches.  Psychiatric/Behavioral:  Negative for confusion. The patient is not nervous/anxious.   Rest see pertinent positives and negatives per HPI.  Current Outpatient Medications on File Prior to Visit  Medication Sig Dispense Refill   acetaminophen (TYLENOL) 325 MG tablet Take 2 tablets (  650 mg total) by mouth every 6 (six) hours as needed for mild pain.     apixaban (ELIQUIS) 5 MG TABS tablet Take 5 mg by mouth 2 (two) times daily.     atorvastatin (LIPITOR) 80 MG tablet Take 40 mg by mouth at bedtime.     calcium-vitamin D (OSCAL WITH D) 500-200 MG-UNIT tablet Take 1 tablet by mouth 2 (two) times daily.     clotrimazole-betamethasone (LOTRISONE) cream Apply 1  application topically daily. 45 g 1   cycloSPORINE (RESTASIS) 0.05 % ophthalmic emulsion Place 1 drop into both eyes 2 (two) times daily as needed (dry eyes).     docusate sodium (COLACE) 100 MG capsule Take 1 capsule (100 mg total) by mouth 2 (two) times daily. 60 capsule 0   ferrous sulfate 325 (65 FE) MG tablet Take 325 mg by mouth daily with breakfast.     FLUoxetine (PROZAC) 20 MG capsule Take 40 mg by mouth daily.     fluticasone (FLONASE) 50 MCG/ACT nasal spray INSTILL 2 SPRAYS IN EACH NOSTRIL EVERY EVENING (Patient taking differently: Place 2 sprays into both nostrils daily as needed for allergies.) 16 g 3   insulin glargine (LANTUS) 100 UNIT/ML injection Inject 0.3 mLs (30 Units total) into the skin daily. 10 mL 3   lidocaine-prilocaine (EMLA) cream Apply to port site 1-2 hours prior to use 30 g 3   Meth-Hyo-M Bl-Na Phos-Ph Sal (URIBEL PO) Take by mouth.     midodrine (PROAMATINE) 5 MG tablet Take 5 mg by mouth in the morning, at noon, and at bedtime.     Multiple Vitamin (MULTI-VITAMINS) TABS Take 1 tablet by mouth daily.     nitroGLYCERIN (NITROSTAT) 0.4 MG SL tablet Place 0.4 mg under the tongue every 5 (five) minutes as needed for chest pain.     nystatin (MYCOSTATIN/NYSTOP) powder Apply 1 application topically 3 (three) times daily. (Patient taking differently: Apply 1 application topically daily.) 15 g 3   oxyCODONE (OXY IR/ROXICODONE) 5 MG immediate release tablet Take 1 tablet (5 mg total) by mouth every 4 (four) hours as needed for severe pain. 15 tablet 0   prochlorperazine (COMPAZINE) 10 MG tablet Take 1 tablet (10 mg total) by mouth every 6 (six) hours as needed for nausea or vomiting. 30 tablet 2   tamsulosin (FLOMAX) 0.4 MG CAPS capsule Take 0.8 mg by mouth at bedtime.     traZODone (DESYREL) 100 MG tablet Take 100 mg by mouth at bedtime.     vitamin B-12 (CYANOCOBALAMIN) 500 MCG tablet Take 500 mcg by mouth daily.     Wheat Dextrin (BENEFIBER PO) Take 1 tablet by mouth  daily.     No current facility-administered medications on file prior to visit.    Past Medical History:  Diagnosis Date   A-fib ([place]) 03/04/2021   Anemia    Arthritis    Cancer (HCC)    prostate   Chronic kidney disease    blood in urine    Constipation    Coronary artery disease    Depression    Diabetes mellitus without complication ([place])    Difficult intubation    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 it was hard to get the tube down his throat   Dyspnea    Family history of breast cancer    Fatty liver    Fatty liver    Frequent headaches    GERD (gastroesophageal reflux disease)    History of chicken pox    History of fainting  spells of unknown cause    History of prostate cancer    Hyperlipidemia    Hypertension    Myocardial infarction [facility])    Peripheral neuropathy    Pneumonia    Prostate cancer (HCC)    PTSD (post-traumatic stress disorder)    Sleep apnea    uses Cpap   Allergies  Allergen Reactions   Furosemide Other (See Comments)    Unknown   Keflex [Cephalexin] Other (See Comments)    Hallucinations?   Lisinopril Other (See Comments)    Unknown   Terazosin Other (See Comments)    Unknown    Social History   Socioeconomic History   Marital status: Married    Spouse name: Not on file   Number of children: 2   Years of education: Not on file   Highest education level: Not on file  Occupational History   Occupation: retired  Tobacco Use   Smoking status: Former    Types: Cigarettes    Quit date: 12/14/1975    Years since quitting: 45.6   Smokeless tobacco: Never  Vaping Use   Vaping Use: Not on file  Substance and Sexual Activity   Alcohol use: Not Currently   Drug use: Never   Sexual activity: Not Currently  Other Topics Concern   Not on file  Social History Narrative   ** Merged History Encounter **       Social Determinants of Health   Financial Resource Strain: Not on file  Food Insecurity: Not on file  Transportation Needs: Not on file   Physical Activity: Not on file  Stress: Not on file  Social Connections: Not on file   Vitals:   07/15/21 1419  BP: 136/70  Pulse: 96  Resp: 16  Temp: 98 F (36.7 C)  SpO2: 97%   Body mass index is 35.94 kg/m.  Physical Exam Vitals and nursing note reviewed.  Constitutional:      General: He is not in acute distress.    Appearance: He is well-developed.  HENT:     Head: Normocephalic and atraumatic.  Eyes:     Conjunctiva/sclera: Conjunctivae normal.  Cardiovascular:     Rate and Rhythm: Normal rate and regular rhythm.     Heart sounds: No murmur heard.    Comments: DP pulses present. Pulmonary:     Effort: Pulmonary effort is normal. No respiratory distress.     Breath sounds: Normal breath sounds.  Abdominal:     Palpations: Abdomen is soft. There is no hepatomegaly or mass.     Tenderness: There is no abdominal tenderness.     Comments: Vertical linear wound healing well, no erythema or drainage.  Musculoskeletal:     Right lower leg: 1+ Pitting Edema present.     Left lower leg: 1+ Pitting Edema present.  Lymphadenopathy:     Cervical: No cervical adenopathy.  Skin:    General: Skin is warm.     Findings: No erythema or rash.  Neurological:     Mental Status: He is alert and oriented to person, place, and time.     Cranial Nerves: No cranial nerve deficit.     Gait: Gait normal.     Comments: Unstable gait,assisted with a walker.  Psychiatric:     Comments: Well groomed, good eye contact.   ASSESSMENT AND PLAN:  Mr.Quantae was seen today for hospitalization follow-up.  Diagnoses and all orders for this visit: Orders Placed This Encounter  Procedures   POC Glucose (CBG)  Type 2 diabetes mellitus with hyperglycemia, unspecified whether long term insulin use (HCC) BS's running high at home. Increase Lantus from 30 U to 35 U. He can increase dose by 5 U weekly if BS's 200's. Monitor BS's fasting and 2 hours postprandials.  Instructed about warning  signs.  -     Continuous Blood Gluc Sensor (FREESTYLE LIBRE SENSOR SYSTEM) MISC; 1 Device by Does not apply route 3 (three) times daily as needed. -     Continuous Blood Gluc Receiver (FREESTYLE LIBRE READER) DEVI; 1 Device by Does not apply route as directed.  Epigastric abdominal pain One episode. We discussed possible etiologies. Gastritis most likely but given his hx of CAD (+Hg 7.7), angina needs to be considered. Protonic resumed. Clearly instructed about warning signs.  Hypotension, unspecified hypotension type He is concerned about elevated BP's at home. He can try Midodrine lower dose, 2.5 mg tid. Continue monitoring BP daily and adequate hydration. Clearly instructed about warning signs.  -     midodrine (PROAMATINE) 2.5 MG tablet; Take 1 tablet (2.5 mg total) by mouth 3 (three) times daily with meals.  Nausea and vomiting in adult We discussed possible etiologies. He has Compazine he can continue tid prn. Could be caused by gastritis/GERD. Instructed about warning signs. Check BS during episode.  Gastritis, erosive Resume Protonix 40 mg 30 min before breakfast. GERD precautions also recommended.  -     pantoprazole (PROTONIX) 40 MG tablet; Take 1 tablet (40 mg total) by mouth daily.  Hypokalemia He is having blood work at his oncologist office next week. K+ rick diet recommended for now. Pancreatic adenocarcinoma ([place])  S/p distal pancreatectomy and splenectomy. Vaccination updated before hospital discharge.  Anemia, unspecified type S/P chemotherapy with gemcitabine/Abraxane x 6, last one 05/15/2021 For the past 10-14 days H/H fluctuating from 7.8-7.7/24.5-24.8 to 8.1-8.4/26-26.3. Next appt with his oncologist on 07/24/21  I spent a total of 47 minutes in both face to face and non face to face activities for this visit on the date of this encounter. During this time history was obtained and documented, examination was performed, prior labs/imaging reviewed, and  assessment/plan discussed.  Return in about 3 months (around 10/15/2021).   Johnika Escareno G. [name], MD  [facility] [place] office.

## 2021-07-15 ENCOUNTER — Ambulatory Visit (INDEPENDENT_AMBULATORY_CARE_PROVIDER_SITE_OTHER): Payer: Medicare Other | Admitting: Family Medicine

## 2021-07-15 ENCOUNTER — Other Ambulatory Visit: Payer: Self-pay

## 2021-07-15 ENCOUNTER — Encounter: Payer: Self-pay | Admitting: Family Medicine

## 2021-07-15 VITALS — BP 136/70 | HR 96 | Temp 98.0°F | Resp 16 | Ht 71.0 in

## 2021-07-15 DIAGNOSIS — I959 Hypotension, unspecified: Secondary | ICD-10-CM

## 2021-07-15 DIAGNOSIS — C259 Malignant neoplasm of pancreas, unspecified: Secondary | ICD-10-CM | POA: Diagnosis not present

## 2021-07-15 DIAGNOSIS — D649 Anemia, unspecified: Secondary | ICD-10-CM | POA: Diagnosis not present

## 2021-07-15 DIAGNOSIS — K2960 Other gastritis without bleeding: Secondary | ICD-10-CM

## 2021-07-15 DIAGNOSIS — I25810 Atherosclerosis of coronary artery bypass graft(s) without angina pectoris: Secondary | ICD-10-CM | POA: Diagnosis not present

## 2021-07-15 DIAGNOSIS — E1165 Type 2 diabetes mellitus with hyperglycemia: Secondary | ICD-10-CM

## 2021-07-15 DIAGNOSIS — R1013 Epigastric pain: Secondary | ICD-10-CM | POA: Diagnosis not present

## 2021-07-15 DIAGNOSIS — E876 Hypokalemia: Secondary | ICD-10-CM

## 2021-07-15 DIAGNOSIS — R112 Nausea with vomiting, unspecified: Secondary | ICD-10-CM | POA: Diagnosis not present

## 2021-07-15 LAB — GLUCOSE, POCT (MANUAL RESULT ENTRY): POC Glucose: 188 mg/dl — AB (ref 70–99)

## 2021-07-15 MED ORDER — PANTOPRAZOLE SODIUM 40 MG PO TBEC: 40.0000 mg | DELAYED_RELEASE_TABLET | Freq: Every day | ORAL | 3 refills | Status: DC

## 2021-07-15 MED ORDER — MIDODRINE HCL 2.5 MG PO TABS: 2.5000 mg | ORAL_TABLET | Freq: Three times a day (TID) | ORAL | 0 refills | Status: DC

## 2021-07-15 MED ORDER — PANTOPRAZOLE SODIUM 40 MG PO TBEC: 40.0000 mg | DELAYED_RELEASE_TABLET | Freq: Every day | ORAL | 1 refills | Status: DC

## 2021-07-15 MED ORDER — FREESTYLE LIBRE SENSOR SYSTEM MISC: 1.0000 | Freq: Three times a day (TID) | 2 refills | Status: DC | PRN

## 2021-07-15 MED ORDER — FREESTYLE LIBRE READER DEVI: 1.0000 | 3 refills | Status: DC

## 2021-07-15 NOTE — Patient Instructions (Addendum)
A few things to remember from today's visit:   Type 2 diabetes mellitus with hyperglycemia, unspecified whether long term insulin use ([place]) - Plan: POC Glucose (CBG)  If you need refills please call your pharmacy. Do not use My Chart to request refills or for acute issues that need immediate attention.   Lantus increased to 5 U and you can increase by 5 U weekly of blood sugars above 200. 145 or less is the goal for fasting glucose and 200 2 hours after meals. If blood pressure under 110/65 persistently and dizziness, we can go back to Midodrine 5 mg.  Rest unchanged. I needed to check anemia and potassium but you are having labs done at your oncologist's office.  Resume Protonix 30 min before breakfast.  Let me know about blood pressure readings and blood sugars in a couple weeks.  Please be sure medication list is accurate. If a new problem present, please set up appointment sooner than planned today.

## 2021-07-16 DIAGNOSIS — I4891 Unspecified atrial fibrillation: Secondary | ICD-10-CM | POA: Diagnosis not present

## 2021-07-16 DIAGNOSIS — E1129 Type 2 diabetes mellitus with other diabetic kidney complication: Secondary | ICD-10-CM | POA: Diagnosis not present

## 2021-07-16 DIAGNOSIS — I10 Essential (primary) hypertension: Secondary | ICD-10-CM | POA: Diagnosis not present

## 2021-07-16 DIAGNOSIS — E440 Moderate protein-calorie malnutrition: Secondary | ICD-10-CM | POA: Diagnosis not present

## 2021-07-16 DIAGNOSIS — C251 Malignant neoplasm of body of pancreas: Secondary | ICD-10-CM | POA: Diagnosis not present

## 2021-07-16 DIAGNOSIS — Z483 Aftercare following surgery for neoplasm: Secondary | ICD-10-CM | POA: Diagnosis not present

## 2021-07-17 ENCOUNTER — Telehealth: Payer: Self-pay

## 2021-07-17 DIAGNOSIS — E1129 Type 2 diabetes mellitus with other diabetic kidney complication: Secondary | ICD-10-CM | POA: Diagnosis not present

## 2021-07-17 DIAGNOSIS — E440 Moderate protein-calorie malnutrition: Secondary | ICD-10-CM | POA: Diagnosis not present

## 2021-07-17 DIAGNOSIS — I4891 Unspecified atrial fibrillation: Secondary | ICD-10-CM | POA: Diagnosis not present

## 2021-07-17 DIAGNOSIS — I10 Essential (primary) hypertension: Secondary | ICD-10-CM | POA: Diagnosis not present

## 2021-07-17 DIAGNOSIS — C251 Malignant neoplasm of body of pancreas: Secondary | ICD-10-CM | POA: Diagnosis not present

## 2021-07-17 DIAGNOSIS — Z483 Aftercare following surgery for neoplasm: Secondary | ICD-10-CM | POA: Diagnosis not present

## 2021-07-17 NOTE — Telephone Encounter (Signed)
Don called in to report low BP readings for patient while he was out doing OT. Pt's BP when he arrived was 122/70. After moving it was 96/68, they let him rest for a few minutes, rechecked and it was 100/68. They walked to the bathroom and back, BP was 80/44 and 100/64 after rest. Spoke with Dr. [name]. She wants BP's checked again tomorrow before increasing medication back to 5 mg tid. Timmothy Sours will leave a message for the PT to check BP upon arrival, after movement, and after rest. Patient is aware that PCP will make a decision on medication after seeing the readings.

## 2021-07-18 ENCOUNTER — Encounter: Payer: Self-pay | Admitting: Oncology

## 2021-07-18 ENCOUNTER — Encounter: Payer: Self-pay | Admitting: Family Medicine

## 2021-07-18 DIAGNOSIS — Z483 Aftercare following surgery for neoplasm: Secondary | ICD-10-CM | POA: Diagnosis not present

## 2021-07-18 DIAGNOSIS — I10 Essential (primary) hypertension: Secondary | ICD-10-CM | POA: Diagnosis not present

## 2021-07-18 DIAGNOSIS — E440 Moderate protein-calorie malnutrition: Secondary | ICD-10-CM | POA: Diagnosis not present

## 2021-07-18 DIAGNOSIS — C251 Malignant neoplasm of body of pancreas: Secondary | ICD-10-CM | POA: Diagnosis not present

## 2021-07-18 DIAGNOSIS — I4891 Unspecified atrial fibrillation: Secondary | ICD-10-CM | POA: Diagnosis not present

## 2021-07-18 DIAGNOSIS — E1129 Type 2 diabetes mellitus with other diabetic kidney complication: Secondary | ICD-10-CM | POA: Diagnosis not present

## 2021-07-18 NOTE — Telephone Encounter (Signed)
[name] called to report that the PT bp at rest today was 148/64. After exercising for a bit it drop to 120/62 then more exercises were done and it drop to 120/62. By the end of it all while the PT sat it was 130/66. She can be reached at [phone] for more info or any questions.

## 2021-07-19 NOTE — Telephone Encounter (Signed)
Can you please call pt and ask about BP readings sine his last visit. If BP < 100/60, he needs to go back to Midodrine 5 mg tid. Thanks, BJ

## 2021-07-19 NOTE — Telephone Encounter (Signed)
I called and spoke with patient. His readings have been 136/72, 143/67, and 144/68. He will keep taking the 2.5 mg.

## 2021-07-22 DIAGNOSIS — Z8546 Personal history of malignant neoplasm of prostate: Secondary | ICD-10-CM | POA: Diagnosis not present

## 2021-07-23 ENCOUNTER — Telehealth: Payer: Self-pay | Admitting: Family Medicine

## 2021-07-23 NOTE — Telephone Encounter (Signed)
We decreased Midodrine from 5 mg tid to 2.5 mg tid, so because elevated BP's he can stop it and continue monitoring BP's. Thanks, BJ

## 2021-07-23 NOTE — Telephone Encounter (Signed)
PT wife called to inform that the PT was seen by [facility] and was advise that he needs to get a meningitis shot the last week of Sept.  PT wife also mention that for the past few days the PT BP has been ranging from 150 to 180 range as well as concern to the PT blood sugar. They have concerns about the mediciation he is on and would like to address.

## 2021-07-23 NOTE — Telephone Encounter (Signed)
I called and spoke with pt's wife. We went over the message below & she verbalized understanding.

## 2021-07-23 NOTE — Telephone Encounter (Signed)
I called and spoke with patient. On the 7th, his BP readings were 158/80, 175/81, 184/86. On the 8th they were 169/73, 157/74, 173/80. His blood sugars have been running 134 and 154 today. On hte 6th, 7th, and 8th he had readings over 200 at least twice. On the 8th he had a reading of 260. Patient is scheduled to come into the office on 8/12 and he will bring his BP monitor and his glucose monitor to compare. Is there anything else they need to do in the mean time?  I will schedule pt for the meninginitis when they are here Friday.

## 2021-07-24 ENCOUNTER — Other Ambulatory Visit: Payer: Self-pay

## 2021-07-24 ENCOUNTER — Inpatient Hospital Stay (HOSPITAL_BASED_OUTPATIENT_CLINIC_OR_DEPARTMENT_OTHER): Payer: Medicare Other | Admitting: Oncology

## 2021-07-24 ENCOUNTER — Inpatient Hospital Stay: Payer: Medicare Other | Attending: Nurse Practitioner

## 2021-07-24 VITALS — BP 151/75 | HR 95 | Temp 97.8°F | Resp 18 | Ht 71.0 in | Wt 248.2 lb

## 2021-07-24 DIAGNOSIS — I4891 Unspecified atrial fibrillation: Secondary | ICD-10-CM | POA: Diagnosis not present

## 2021-07-24 DIAGNOSIS — Z5111 Encounter for antineoplastic chemotherapy: Secondary | ICD-10-CM | POA: Diagnosis not present

## 2021-07-24 DIAGNOSIS — Z95828 Presence of other vascular implants and grafts: Secondary | ICD-10-CM

## 2021-07-24 DIAGNOSIS — C251 Malignant neoplasm of body of pancreas: Secondary | ICD-10-CM | POA: Diagnosis not present

## 2021-07-24 DIAGNOSIS — I25810 Atherosclerosis of coronary artery bypass graft(s) without angina pectoris: Secondary | ICD-10-CM | POA: Diagnosis not present

## 2021-07-24 DIAGNOSIS — C250 Malignant neoplasm of head of pancreas: Secondary | ICD-10-CM

## 2021-07-24 DIAGNOSIS — K2960 Other gastritis without bleeding: Secondary | ICD-10-CM

## 2021-07-24 DIAGNOSIS — E1129 Type 2 diabetes mellitus with other diabetic kidney complication: Secondary | ICD-10-CM | POA: Diagnosis not present

## 2021-07-24 DIAGNOSIS — Z483 Aftercare following surgery for neoplasm: Secondary | ICD-10-CM | POA: Diagnosis not present

## 2021-07-24 DIAGNOSIS — I10 Essential (primary) hypertension: Secondary | ICD-10-CM | POA: Diagnosis not present

## 2021-07-24 DIAGNOSIS — E440 Moderate protein-calorie malnutrition: Secondary | ICD-10-CM | POA: Diagnosis not present

## 2021-07-24 MED ORDER — SODIUM CHLORIDE 0.9% FLUSH
10.0000 mL | Freq: Once | INTRAVENOUS | Status: AC
  Administered 2021-07-24: 10 mL
  Filled 2021-07-24: qty 10

## 2021-07-24 MED ORDER — HEPARIN SOD (PORK) LOCK FLUSH 100 UNIT/ML IV SOLN
500.0000 [IU] | Freq: Once | INTRAVENOUS | Status: AC
  Administered 2021-07-24: 500 [IU]
  Filled 2021-07-24: qty 5

## 2021-07-24 NOTE — Patient Instructions (Signed)
Implanted Port Home Guide An implanted port is a device that is placed under the skin. It is usually placed in the chest. The device can be used to give IV medicine, to take blood, or for dialysis. You may have an implanted port if: You need IV medicine that would be irritating to the small veins in your hands or arms. You need IV medicines, such as antibiotics, for a long period of time. You need IV nutrition for a long period of time. You need dialysis. When you have a port, your health care provider can choose to use the port instead of veins in your arms for these procedures. You may have fewer limitations when using a port than you would if you used other types of long-term IVs, and you will likely be able to return to normal activities afteryour incision heals. An implanted port has two main parts: Reservoir. The reservoir is the part where a needle is inserted to give medicines or draw blood. The reservoir is round. After it is placed, it appears as a small, raised area under your skin. Catheter. The catheter is a thin, flexible tube that connects the reservoir to a vein. Medicine that is inserted into the reservoir goes into the catheter and then into the vein. How is my port accessed? To access your port: A numbing cream may be placed on the skin over the port site. Your health care provider will put on a mask and sterile gloves. The skin over your port will be cleaned carefully with a germ-killing soap and allowed to dry. Your health care provider will gently pinch the port and insert a needle into it. Your health care provider will check for a blood return to make sure the port is in the vein and is not clogged. If your port needs to remain accessed to get medicine continuously (constant infusion), your health care provider will place a clear bandage (dressing) over the needle site. The dressing and needle will need to be changed every week, or as told by your health care provider. What  is flushing? Flushing helps keep the port from getting clogged. Follow instructions from your health care provider about how and when to flush the port. Ports are usually flushed with saline solution or a medicine called heparin. The need for flushing will depend on how the port is used: If the port is only used from time to time to give medicines or draw blood, the port may need to be flushed: Before and after medicines have been given. Before and after blood has been drawn. As part of routine maintenance. Flushing may be recommended every 4-6 weeks. If a constant infusion is running, the port may not need to be flushed. Throw away any syringes in a disposal container that is meant for sharp items (sharps container). You can buy a sharps container from a pharmacy, or you can make one by using an empty hard plastic bottle with a cover. How long will my port stay implanted? The port can stay in for as long as your health care provider thinks it is needed. When it is time for the port to come out, a surgery will be done to remove it. The surgery will be similar to the procedure that was done to putthe port in. Follow these instructions at home:  Flush your port as told by your health care provider. If you need an infusion over several days, follow instructions from your health care provider about how to take   care of your port site. Make sure you: Wash your hands with soap and water before you change your dressing. If soap and water are not available, use alcohol-based hand sanitizer. Change your dressing as told by your health care provider. Place any used dressings or infusion bags into a plastic bag. Throw that bag in the trash. Keep the dressing that covers the needle clean and dry. Do not get it wet. Do not use scissors or sharp objects near the tube. Keep the tube clamped, unless it is being used. Check your port site every day for signs of infection. Check for: Redness, swelling, or  pain. Fluid or blood. Pus or a bad smell. Protect the skin around the port site. Avoid wearing bra straps that rub or irritate the site. Protect the skin around your port from seat belts. Place a soft pad over your chest if needed. Bathe or shower as told by your health care provider. The site may get wet as long as you are not actively receiving an infusion. Return to your normal activities as told by your health care provider. Ask your health care provider what activities are safe for you. Carry a medical alert card or wear a medical alert bracelet at all times. This will let health care providers know that you have an implanted port in case of an emergency. Get help right away if: You have redness, swelling, or pain at the port site. You have fluid or blood coming from your port site. You have pus or a bad smell coming from the port site. You have a fever. Summary Implanted ports are usually placed in the chest for long-term IV access. Follow instructions from your health care provider about flushing the port and changing bandages (dressings). Take care of the area around your port by avoiding clothing that puts pressure on the area, and by watching for signs of infection. Protect the skin around your port from seat belts. Place a soft pad over your chest if needed. Get help right away if you have a fever or you have redness, swelling, pain, drainage, or a bad smell at the port site. This information is not intended to replace advice given to you by your health care provider. Make sure you discuss any questions you have with your healthcare provider. Document Revised: 04/16/2020 Document Reviewed: 04/16/2020 Elsevier Patient Education  2022 Elsevier Inc.  

## 2021-07-24 NOTE — Progress Notes (Signed)
HPI: Chief Complaint  Patient presents with   Medication Management   [name] is a 75 y.o. male, who is here today for chronic disease management. Last seen on 07/15/21 Orthostatic hypotension: Last visit dose of midodrine was decreased from 5 mg tid to 2.5 mg tid. BP still running high, called on 07/23/21 reporting BP's 150-180/80's, recommended discontinuing Midodrine. He is monitoring BP at home and BP's 140-150's/80's. PT checked BP yesterday seated and standing and it was "fine."  He does not feel lightheaded in the morning when getting. Negative for unusual or severe headache, visual changes, exertional chest pain, dyspnea,  focal weakness, or worsening edema. He did not have labs at his oncologist visit. 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  Lab Results  Component Value Date   CREATININE 1.00 07/08/2021   BUN 11 07/08/2021   NA 138 07/08/2021   K 3.0 (L) 07/08/2021   CL 107 07/08/2021   CO2 25 07/08/2021   Anemia: He is on Ferrous sulfate 325 mg daily. He has not noted gum/nose bleeding,blood in stool,melena,gross hematuria,or bruising.  Lab Results  Component Value Date   WBC 9.5 07/08/2021   HGB 7.7 (L) 07/08/2021   HCT 24.8 (L) 07/08/2021   MCV 90.8 07/08/2021   PLT 362 07/08/2021    Diabetes Mellitus II: Last visit Lantus adjusted at 50 U.  - Checking BG at home: up to 170's-180's. He has not had BS's < 80.  - Negative for symptoms of hypoglycemia, polyuria, polydipsia,foot ulcers/trauma  Lab Results  Component Value Date   HGBA1C 4.8 06/04/2021   Lab Results  Component Value Date   MICROALBUR 7.8 10/10/2020   Review of Systems  Constitutional: Negative for activity change, appetite change,and fever. + Fatigue. HENT: Negative for mouth sores, nosebleeds and trouble swallowing.   Eyes: Negative for redness and visual disturbance.  Respiratory: Negative for cough and wheezing.   Gastrointestinal: Negative  for abdominal pain, nausea and vomiting.     Negative for changes in bowel habits.  Genitourinary: Negative for decreased urine volume and dysuria. Musculoskeletal: Positive for gait problem and arthralgias.  Skin: Negative for color change and rash.  Neurological: Negative for syncope,. Rest of ROS see pertinent positives and negatives in HPI.  Current Outpatient Medications on File Prior to Visit  Medication Sig Dispense Refill   acetaminophen (TYLENOL) 325 MG tablet Take 2 tablets (650 mg total) by mouth every 6 (six) hours as needed for mild pain.     apixaban (ELIQUIS) 5 MG TABS tablet Take 5 mg by mouth 2 (two) times daily.     atorvastatin (LIPITOR) 80 MG tablet Take 40 mg by mouth at bedtime.     calcium-vitamin D (OSCAL WITH D) 500-200 MG-UNIT tablet Take 1 tablet by mouth 2 (two) times daily.     clotrimazole-betamethasone (LOTRISONE) cream Apply 1 application topically daily. 45 g 1   Continuous Blood Gluc Receiver (FREESTYLE LIBRE READER) DEVI 1 Device by Does not apply route as directed. 6 each 3   Continuous Blood Gluc Sensor (FREESTYLE LIBRE SENSOR SYSTEM) MISC 1 Device by Does not apply route 3 (three) times daily as needed. 6 each 2   cycloSPORINE (RESTASIS) 0.05 % ophthalmic emulsion Place 1 drop into both eyes 2 (two) times daily as needed (dry eyes).     docusate sodium (COLACE) 100 MG capsule Take 1 capsule (100 mg total) by mouth 2 (two) times daily. 60 capsule 0   ferrous sulfate 325 (65 FE)  MG tablet Take 325 mg by mouth daily with breakfast.     FLUoxetine (PROZAC) 20 MG capsule Take 40 mg by mouth daily.     fluticasone (FLONASE) 50 MCG/ACT nasal spray INSTILL 2 SPRAYS IN EACH NOSTRIL EVERY EVENING (Patient taking differently: Place 2 sprays into both nostrils daily as needed for allergies.) 16 g 3   insulin glargine (LANTUS) 100 UNIT/ML injection Inject 0.3 mLs (30 Units total) into the skin daily. (Patient taking differently: Inject 50 Units into the skin daily.) 10  mL 3   lidocaine-prilocaine (EMLA) cream Apply to port site 1-2 hours prior to use 30 g 3   Meth-Hyo-M Bl-Na Phos-Ph Sal (URIBEL PO) Take by mouth.     Multiple Vitamin (MULTI-VITAMINS) TABS Take 1 tablet by mouth daily.     nitroGLYCERIN (NITROSTAT) 0.4 MG SL tablet Place 0.4 mg under the tongue every 5 (five) minutes as needed for chest pain.     nystatin (MYCOSTATIN/NYSTOP) powder Apply 1 application topically 3 (three) times daily. (Patient taking differently: Apply 1 application topically daily.) 15 g 3   oxyCODONE (OXY IR/ROXICODONE) 5 MG immediate release tablet Take 1 tablet (5 mg total) by mouth every 4 (four) hours as needed for severe pain. 15 tablet 0   pantoprazole (PROTONIX) 40 MG tablet Take 1 tablet (40 mg total) by mouth daily. 90 tablet 3   prochlorperazine (COMPAZINE) 10 MG tablet Take 1 tablet (10 mg total) by mouth every 6 (six) hours as needed for nausea or vomiting. 30 tablet 2   tamsulosin (FLOMAX) 0.4 MG CAPS capsule Take 0.8 mg by mouth at bedtime.     traZODone (DESYREL) 100 MG tablet Take 100 mg by mouth at bedtime.     vitamin B-12 (CYANOCOBALAMIN) 500 MCG tablet Take 500 mcg by mouth daily.     Wheat Dextrin (BENEFIBER PO) Take 1 tablet by mouth daily.     No current facility-administered medications on file prior to visit.   Past Medical History:  Diagnosis Date   A-fib ([place]) 03/04/2021   Anemia    Arthritis    Cancer (HCC)    prostate   Chronic kidney disease    blood in urine    Constipation    Coronary artery disease    Depression    Diabetes mellitus without complication ([place])    Difficult intubation    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 it was hard to get the tube down his throat   Dyspnea    Family history of breast cancer    Fatty liver    Fatty liver    Frequent headaches    GERD (gastroesophageal reflux disease)    History of chicken pox    History of fainting spells of unknown cause    History of prostate cancer    Hyperlipidemia    Hypertension     Myocardial infarction ([place])    Peripheral neuropathy    Pneumonia    Prostate cancer (HCC)    PTSD (post-traumatic stress disorder)    Sleep apnea    uses Cpap   Allergies  Allergen Reactions   Furosemide Other (See Comments)    Unknown   Keflex [Cephalexin] Other (See Comments)    Hallucinations?   Lisinopril Other (See Comments)    Unknown   Terazosin Other (See Comments)    Unknown    Social History   Socioeconomic History   Marital status: Married    Spouse name: Not on file   Number of children: 2  Years of education: Not on file   Highest education level: Not on file  Occupational History   Occupation: retired  Tobacco Use   Smoking status: Former    Types: Cigarettes    Quit date: 12/14/1975    Years since quitting: 45.6   Smokeless tobacco: Never  Vaping Use   Vaping Use: Not on file  Substance and Sexual Activity   Alcohol use: Not Currently   Drug use: Never   Sexual activity: Not Currently  Other Topics Concern   Not on file  Social History Narrative   ** Merged History Encounter **       Social Determinants of Health   Financial Resource Strain: Not on file  Food Insecurity: Not on file  Transportation Needs: Not on file  Physical Activity: Not on file  Stress: Not on file  Social Connections: Not on file    Today's Vitals   07/26/21 1000  BP: 124/70  Pulse: 100  Resp: 16  SpO2: 97%  Weight: 248 lb (112.5 kg)  Height: '5\' 11"'  (1.803 m)   Body mass index is 34.59 kg/m.  Physical Exam  Nursing note and vitals reviewed. Constitutional: She is oriented to person, place, and time. She appears well-developed. No distress.  HENT:  Head: Normocephalic and atraumatic.  Eyes: Conjunctivae are normal.  Cardiovascular: Normal rate and regular rhythm.  No murmur heard. Respiratory: Effort normal and breath sounds normal. No respiratory distress.  GI: Soft. here is no tenderness.  Neurological: She is alert and oriented to person, place,  and time. No focal deficit. Unstable gait assisted with a walker. Skin: Skin is warm. No rash noted. No erythema.  Psychiatric: She has a normal mood and affect.  Well groomed, good eye contact.   ASSESSMENT AND PLAN:  [name] was seen today for medication management.  Diagnoses and all orders for this visit: Orders Placed This Encounter  Procedures   Basic metabolic panel   Hepatitis C antibody   CBC   POC Glucose (CBG)   Lab Results  Component Value Date   WBC 7.5 07/26/2021   HGB 9.6 (L) 07/26/2021   HCT 31.6 (L) 07/26/2021   MCV 88.2 07/26/2021   PLT 562.0 (H) 07/26/2021   Lab Results  Component Value Date   CREATININE 0.98 07/26/2021   BUN 18 07/26/2021   NA 137 07/26/2021   K 3.6 07/26/2021   CL 102 07/26/2021   CO2 28 07/26/2021   Type 2 diabetes mellitus with stage 3 chronic kidney disease, with long-term current use of insulin, unspecified whether stage 3a or 3b CKD (HCC) Continue Lantus 50 U daily. His glucose strips are expired, so he is getting new ones. Continue appropriate foot care. Eye exam is current.  -     Blood Glucose Monitoring Suppl (ONETOUCH VERIO FLEX SYSTEM) w/Device KIT; Use to test blood sugars 1-2 times daily. -     glucose blood (ONETOUCH VERIO) test strip; Use to test blood sugars 1-2 times daily. -     Lancets (ONETOUCH ULTRASOFT) lancets; Use to test blood sugars 1-2 times daily.  Orthostatic hypotension His BP monitor does not seem to be accurate, he will get a new one. BP with his BP monitor 153/86. Continue non pharmacologic treatment, adequate hydration. PT is monitoring BP's. Clearly instructed about warning signs.  Encounter for HCV screening test for low risk patient -     Hepatitis C antibody  Hypokalemia K+ rich diet to continue for now, recommendations will be given  according to K+ result.  Anemia, unspecified type Continue Fe sulfate 325 mg daily. Chronic disease + recent surgery+chemo. Further recommendations  according to CBC result.  Return if symptoms worsen or fail to improve, for Keep next appt.   Octavius Bill [name], MD [facility]. [place] office.

## 2021-07-24 NOTE — Progress Notes (Signed)
[facility] OFFICE PROGRESS NOTE   Diagnosis: Pancreas cancer  INTERVAL HISTORY:   [name] underwent a distal pancreatectomy/splenectomy by Dr. Zenia Resides on 07/01/2021.  There was no discrete palpable mass in the pancreas at the time of surgery.  The gland was firm at an area of caliber change of the main pancreas duct. The pathology revealed a 0.8 cm poorly differentiated adenocarcinoma with negative resection margins.  6 lymph nodes were negative.  The spleen is benign.  There is residual cancer with evidence of tumor regression, partial response score 2.  There were 3 clustered foci of residual carcinoma with the largest individual cluster measuring 0.2 cm over a span of 0.8 cm.No lymphovascular perineural invasion.ypT1b,ypN0 [name] reports a poor appetite following surgery.  No pain. Objective:  Vital signs in last 24 hours:  Blood pressure (!) 151/75, pulse 95, temperature 97.8 F (36.6 C), temperature source Oral, resp. rate 18, height '5\' 11"'$  (1.803 m), weight 112.6 kg, SpO2 99 %.      Resp: Lungs clear bilaterally Cardio: Regular rate and rhythm GI: No hepatomegaly, nontender, healed surgical incision Vascular: No leg edema   Portacath/PICC-without erythema  Lab Results:  Lab Results  Component Value Date   WBC 9.5 07/08/2021   HGB 7.7 (L) 07/08/2021   HCT 24.8 (L) 07/08/2021   MCV 90.8 07/08/2021   PLT 362 07/08/2021   NEUTROABS 5.3 06/12/2021    CMP  Lab Results  Component Value Date   NA 138 07/08/2021   K 3.0 (L) 07/08/2021   CL 107 07/08/2021   CO2 25 07/08/2021   GLUCOSE 81 07/08/2021   BUN 11 07/08/2021   CREATININE 1.00 07/08/2021   CALCIUM 8.0 (L) 07/08/2021   PROT 5.0 (L) 07/01/2021   ALBUMIN 3.1 (L) 07/01/2021   AST 29 07/01/2021   ALT 23 07/01/2021   ALKPHOS 42 07/01/2021   BILITOT 0.9 07/01/2021   GFRNONAA >60 07/08/2021   GFRAA 49 (L) 10/10/2020     Medications: I have reviewed the patient's current  medications.   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  No pancreatic ductal dilatation identified.  No definite signs of metastatic disease in the chest, abdomen or pelvis. Cycle 1 gemcitabine/Abraxane 03/01/2021 Cycle 2 gemcitabine/Abraxane 03/14/2021 Cycle 3 gemcitabine/Abraxane 03/28/2021 Cycle 4 gemcitabine/Abraxane 04/17/2021 Cycle 5 gemcitabine/Abraxane 05/01/2021, Zofran/Decadron added Cycle 6 gemcitabine/Abraxane 05/15/2021 CT pancreas protocol 05/27/2021-unchanged 8 mm exophytic low-attenuation lesion at the posterior body of the pancreas 07/01/2021-distal pancreatectomy/splenectomy, 0.8 cm poorly differentiated adenocarcinoma, treatment response score-2, largest single foci of remaining tumor 0.2 cm, negative resection margins, 0/6 lymph nodes,ypT1bypNo, PanIN-1b Diabetes Coronary artery disease Prostate cancer 2013-treated with radiation at [facility] Hypertension Sleep apnea 7.  Coronary artery bypass surgery 2004 8.  Hospital admission 03/04/2021-syncope, A. Fib-started on Eliquis 9.  Mild thrombocytopenia following cycle 1 gemcitabine/Abraxane-resolved 10.  Anemia    Disposition: [name] is recovering from the pancreas surgery.  I reviewed details of the surgical pathology report with him.  He has a good prognosis relative to  most patients with pancreas cancer.  He completed 3 months of  neoadjuvant therapy with evidence of a clinical response.  I recommend 2-3 more months of gemcitabine/Abraxane.  He reports no change in baseline neuropathy symptoms since starting chemotherapy.  He agrees.  The plan is to resume every 2-week gemcitabine/Abraxane on 08/14/2021.  Betsy Coder, MD  07/24/2021  11:39 AM

## 2021-07-25 ENCOUNTER — Other Ambulatory Visit: Payer: Self-pay

## 2021-07-26 ENCOUNTER — Ambulatory Visit (INDEPENDENT_AMBULATORY_CARE_PROVIDER_SITE_OTHER): Payer: Medicare Other | Admitting: Family Medicine

## 2021-07-26 ENCOUNTER — Telehealth: Payer: Self-pay

## 2021-07-26 ENCOUNTER — Encounter: Payer: Self-pay | Admitting: Family Medicine

## 2021-07-26 VITALS — BP 124/70 | HR 100 | Resp 16 | Ht 71.0 in | Wt 248.0 lb

## 2021-07-26 DIAGNOSIS — I25810 Atherosclerosis of coronary artery bypass graft(s) without angina pectoris: Secondary | ICD-10-CM | POA: Diagnosis not present

## 2021-07-26 DIAGNOSIS — N183 Chronic kidney disease, stage 3 unspecified: Secondary | ICD-10-CM

## 2021-07-26 DIAGNOSIS — Z483 Aftercare following surgery for neoplasm: Secondary | ICD-10-CM | POA: Diagnosis not present

## 2021-07-26 DIAGNOSIS — I951 Orthostatic hypotension: Secondary | ICD-10-CM | POA: Diagnosis not present

## 2021-07-26 DIAGNOSIS — D649 Anemia, unspecified: Secondary | ICD-10-CM

## 2021-07-26 DIAGNOSIS — E1165 Type 2 diabetes mellitus with hyperglycemia: Secondary | ICD-10-CM | POA: Diagnosis not present

## 2021-07-26 DIAGNOSIS — E440 Moderate protein-calorie malnutrition: Secondary | ICD-10-CM | POA: Diagnosis not present

## 2021-07-26 DIAGNOSIS — C251 Malignant neoplasm of body of pancreas: Secondary | ICD-10-CM | POA: Diagnosis not present

## 2021-07-26 DIAGNOSIS — E1122 Type 2 diabetes mellitus with diabetic chronic kidney disease: Secondary | ICD-10-CM | POA: Diagnosis not present

## 2021-07-26 DIAGNOSIS — I119 Hypertensive heart disease without heart failure: Secondary | ICD-10-CM

## 2021-07-26 DIAGNOSIS — Z794 Long term (current) use of insulin: Secondary | ICD-10-CM

## 2021-07-26 DIAGNOSIS — E876 Hypokalemia: Secondary | ICD-10-CM | POA: Diagnosis not present

## 2021-07-26 DIAGNOSIS — Z1159 Encounter for screening for other viral diseases: Secondary | ICD-10-CM

## 2021-07-26 DIAGNOSIS — I4891 Unspecified atrial fibrillation: Secondary | ICD-10-CM | POA: Diagnosis not present

## 2021-07-26 DIAGNOSIS — E1129 Type 2 diabetes mellitus with other diabetic kidney complication: Secondary | ICD-10-CM | POA: Diagnosis not present

## 2021-07-26 DIAGNOSIS — I10 Essential (primary) hypertension: Secondary | ICD-10-CM | POA: Diagnosis not present

## 2021-07-26 LAB — CBC
HCT: 31.6 % — ABNORMAL LOW (ref 39.0–52.0)
Hemoglobin: 9.6 g/dL — ABNORMAL LOW (ref 13.0–17.0)
MCHC: 30.3 g/dL (ref 30.0–36.0)
MCV: 88.2 fl (ref 78.0–100.0)
Platelets: 562 10*3/uL — ABNORMAL HIGH (ref 150.0–400.0)
RBC: 3.58 Mil/uL — ABNORMAL LOW (ref 4.22–5.81)
RDW: 19 % — ABNORMAL HIGH (ref 11.5–15.5)
WBC: 7.5 10*3/uL (ref 4.0–10.5)

## 2021-07-26 LAB — BASIC METABOLIC PANEL
BUN: 18 mg/dL (ref 6–23)
CO2: 28 mEq/L (ref 19–32)
Calcium: 9.1 mg/dL (ref 8.4–10.5)
Chloride: 102 mEq/L (ref 96–112)
Creatinine, Ser: 0.98 mg/dL (ref 0.40–1.50)
GFR: 74.78 mL/min (ref >60.00)
Glucose, Bld: 133 mg/dL — ABNORMAL HIGH (ref 70–99)
Potassium: 3.6 mEq/L (ref 3.5–5.1)
Sodium: 137 mEq/L (ref 135–145)

## 2021-07-26 LAB — GLUCOSE, POCT (MANUAL RESULT ENTRY): POC Glucose: 150 mg/dl — AB (ref 70–99)

## 2021-07-26 MED ORDER — ONETOUCH ULTRASOFT LANCETS MISC
12 refills | Status: DC
Start: 2021-07-26 — End: 2021-09-27

## 2021-07-26 MED ORDER — ONETOUCH VERIO FLEX SYSTEM W/DEVICE KIT: PACK | 0 refills | Status: DC

## 2021-07-26 MED ORDER — ONETOUCH VERIO VI STRP: ORAL_STRIP | 12 refills | Status: DC

## 2021-07-26 MED ORDER — BLOOD PRESSURE MONITOR/L CUFF MISC: 1 refills | Status: DC

## 2021-07-26 NOTE — Telephone Encounter (Signed)
fyi

## 2021-07-26 NOTE — Patient Instructions (Signed)
A few things to remember from today's visit:   Type 2 diabetes mellitus with hyperglycemia, unspecified whether long term insulin use ([place]) - Plan: POC Glucose (CBG)  Hypertension with heart disease - Plan: Basic metabolic panel  Encounter for HCV screening test for low risk patient - Plan: Hepatitis C antibody  If you need refills please call your pharmacy. Do not use My Chart to request refills or for acute issues that need immediate attention.   No changes today. Continue monitoring blood pressures and blood sugars. Fall precautions.  Please be sure medication list is accurate. If a new problem present, please set up appointment sooner than planned today.

## 2021-07-26 NOTE — Telephone Encounter (Signed)
[place] from [facility] called with vitals for pt  Resting sitting B/P 142/72  Standing B/P 152/80 After exercising resting B/P 152/72

## 2021-07-28 ENCOUNTER — Encounter: Payer: Self-pay | Admitting: Family Medicine

## 2021-07-29 DIAGNOSIS — R300 Dysuria: Secondary | ICD-10-CM | POA: Diagnosis not present

## 2021-07-29 DIAGNOSIS — D3500 Benign neoplasm of unspecified adrenal gland: Secondary | ICD-10-CM | POA: Diagnosis not present

## 2021-07-29 DIAGNOSIS — R3912 Poor urinary stream: Secondary | ICD-10-CM | POA: Diagnosis not present

## 2021-07-29 DIAGNOSIS — R3915 Urgency of urination: Secondary | ICD-10-CM | POA: Diagnosis not present

## 2021-07-29 LAB — HEPATITIS C ANTIBODY
Hepatitis C Ab: NONREACTIVE
SIGNAL TO CUT-OFF: 0.01 (ref <1.00)

## 2021-07-30 DIAGNOSIS — I10 Essential (primary) hypertension: Secondary | ICD-10-CM | POA: Diagnosis not present

## 2021-07-30 DIAGNOSIS — E440 Moderate protein-calorie malnutrition: Secondary | ICD-10-CM | POA: Diagnosis not present

## 2021-07-30 DIAGNOSIS — I4891 Unspecified atrial fibrillation: Secondary | ICD-10-CM | POA: Diagnosis not present

## 2021-07-30 DIAGNOSIS — C251 Malignant neoplasm of body of pancreas: Secondary | ICD-10-CM | POA: Diagnosis not present

## 2021-07-30 DIAGNOSIS — Z483 Aftercare following surgery for neoplasm: Secondary | ICD-10-CM | POA: Diagnosis not present

## 2021-07-30 DIAGNOSIS — E1129 Type 2 diabetes mellitus with other diabetic kidney complication: Secondary | ICD-10-CM | POA: Diagnosis not present

## 2021-07-31 DIAGNOSIS — E440 Moderate protein-calorie malnutrition: Secondary | ICD-10-CM | POA: Diagnosis not present

## 2021-07-31 DIAGNOSIS — I4891 Unspecified atrial fibrillation: Secondary | ICD-10-CM | POA: Diagnosis not present

## 2021-07-31 DIAGNOSIS — C251 Malignant neoplasm of body of pancreas: Secondary | ICD-10-CM | POA: Diagnosis not present

## 2021-07-31 DIAGNOSIS — Z483 Aftercare following surgery for neoplasm: Secondary | ICD-10-CM | POA: Diagnosis not present

## 2021-07-31 DIAGNOSIS — I10 Essential (primary) hypertension: Secondary | ICD-10-CM | POA: Diagnosis not present

## 2021-07-31 DIAGNOSIS — E1129 Type 2 diabetes mellitus with other diabetic kidney complication: Secondary | ICD-10-CM | POA: Diagnosis not present

## 2021-08-04 NOTE — Progress Notes (Signed)
Cardiology Office Note   Date:  08/08/2021   ID:  [name], [name] [birth year], MRN [id number]  PCP:  [place], [name] G, MD  Cardiologist:   Davena Julian [place], MD   Chief Complaint  Patient presents with   Coronary Artery Disease       History of Present Illness: [name] is a 75 y.o. male who is seen for follow up  of CAD and Afib. He has a history of DM on insulin, CKD, HTN and HLD. He has known CAD s/p CABG in 2004. Previously followed by Dr. Dorthey Sawyer at [facility] in [place]. Last Cardiac cath in July 2019 for evaluation of chest pain showed 90% proximal LAD, occluded nondominant LCx and occluded RCA. The grafts were patent including LIMA to the LAD, SVG to RCA and SVG to ramus with 40-50% proximal stenosis. Normal EF and EDP. He is a [place] vet and has a history of PTSD and Agent Orange exposure.   He  moved from [place] to be close to family. 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 He is married. He is  sedentary. Notes his legs get weak and he uses a cane to walk.  Has had some falls in the past. Denies any anginal symptoms.   In January 2022 he was diagnosed with pancreatic adenocarcinoma- noted incidentally on MRI during work up for UTI.  He has since had portAcath placed with plans for neoadjuvant chemotherapy prior to resection.   He admitted from 03/04/2021 to 03/07/2021 for syncopal episode due to severe orthostatic hypotension and new onset atrial fibrillation. High-sensitivity troponin flat in the 40's. D-dimer was elevated but CT negative for PE. Echo showed LVEF of 60-65% with normal wall motion. All home antihypertensive were held and he was treated with IV fluids. Ultimately started Midodrine and orthostatic hypotension resolved. He was started on Eliquis for atrial fibrillation. He was also started on antibiotic for possible cellulitis. Later midodrine discontinued by PCP since BP remained elevated. More recently  BP improved.   He underwent 6 rounds of chemotherapy with gemcitabine/Abraxane. In July he underwent surgery with pancreatectomy and splenectomy. Plan to resume chemotherapy this week.   He denies any palpitations. Few very minor chest pains. No dyspnea. Has some chronic right leg swelling. Overall feels well.      Past Medical History:  Diagnosis Date   A-fib (HCC) 03/04/2021   Anemia    Arthritis    Cancer (HCC)    prostate   Chronic kidney disease    blood in urine    Constipation    Coronary artery disease    Depression    Diabetes mellitus without complication (HCC)    Difficult intubation    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 it was hard to get the tube down his throat   Dyspnea    Family history of breast cancer    Fatty liver    Fatty liver    Frequent headaches    GERD (gastroesophageal reflux disease)    History of chicken pox    History of fainting spells of unknown cause    History of prostate cancer    Hyperlipidemia    Hypertension    Myocardial infarction [facility])    Peripheral neuropathy    Pneumonia    Prostate cancer (HCC)    PTSD (post-traumatic stress disorder)    Sleep apnea    uses Cpap    Past Surgical History:  Procedure Laterality  Date   APPENDECTOMY     BIOPSY  01/15/2021   Procedure: BIOPSY;  Surgeon: Meridee Score Netty Starring., MD;  Location: [facility] ENDOSCOPY;  Service: Gastroenterology;;   CARDIAC CATHETERIZATION  06/26/2017   CARDIAC SURGERY     Triple Bypass   CHOLECYSTECTOMY  2010   COLONOSCOPY     CORONARY ARTERY BYPASS GRAFT  2004   ESOPHAGOGASTRODUODENOSCOPY (EGD) WITH PROPOFOL N/A 01/15/2021   Procedure: ESOPHAGOGASTRODUODENOSCOPY (EGD) WITH PROPOFOL;  Surgeon: Lemar Lofty., MD;  Location: [facility] ENDOSCOPY;  Service: Gastroenterology;  Laterality: N/A;   EUS N/A 01/15/2021   Procedure: UPPER ENDOSCOPIC ULTRASOUND (EUS) RADIAL;  Surgeon: Lemar Lofty., MD;  Location: [facility] ENDOSCOPY;  Service: Gastroenterology;  Laterality: N/A;    EYE SURGERY Bilateral    cataract removal   FINE NEEDLE ASPIRATION  01/15/2021   Procedure: FINE NEEDLE ASPIRATION (FNA) LINEAR;  Surgeon: Lemar Lofty., MD;  Location: [facility] ENDOSCOPY;  Service: Gastroenterology;;   LAPAROSCOPY N/A 07/01/2021   Procedure: STAGING LAPAROSCOPY;  Surgeon: Fritzi Mandes, MD;  Location: [facility] OR;  Service: General;  Laterality: N/A;   PORTACATH PLACEMENT Right 02/21/2021   Procedure: INSERTION PORT-A-CATH;  Surgeon: Fritzi Mandes, MD;  Location: [facility] OR;  Service: General;  Laterality: Right;   SPLENECTOMY, 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   Procedure: SPLENECTOMY;  Surgeon: Fritzi Mandes, MD;  Location: [facility] OR;  Service: General;  Laterality: N/A;   TONSILLECTOMY  1958     Current Outpatient Medications  Medication Sig Dispense Refill   acetaminophen (TYLENOL) 325 MG tablet Take 2 tablets (650 mg total) by mouth every 6 (six) hours as needed for mild pain.     apixaban (ELIQUIS) 5 MG TABS tablet Take 5 mg by mouth 2 (two) times daily.     atorvastatin (LIPITOR) 80 MG tablet Take 40 mg by mouth at bedtime.     Blood Glucose Monitoring Suppl (ONETOUCH VERIO FLEX SYSTEM) w/Device KIT Use to test blood sugars 1-2 times daily. 1 kit 0   Blood Pressure Monitoring (BLOOD PRESSURE MONITOR/L CUFF) MISC Use to check blood pressures. 1 each 1   calcium-vitamin D (OSCAL WITH D) 500-200 MG-UNIT tablet Take 1 tablet by mouth 2 (two) times daily.     clotrimazole-betamethasone (LOTRISONE) cream Apply 1 application topically daily. 45 g 1   Continuous Blood Gluc Receiver (FREESTYLE LIBRE READER) DEVI 1 Device by Does not apply route as directed. 6 each 3   Continuous Blood Gluc Sensor (FREESTYLE LIBRE SENSOR SYSTEM) MISC 1 Device by Does not apply route 3 (three) times daily as needed. 6 each 2   cycloSPORINE (RESTASIS) 0.05 % ophthalmic emulsion Place 1 drop into both eyes 2 (two) times daily as needed (dry eyes).     docusate sodium (COLACE) 100 MG capsule Take 1 capsule (100 mg  total) by mouth 2 (two) times daily. 60 capsule 0   ferrous sulfate 325 (65 FE) MG tablet Take 325 mg by mouth daily with breakfast.     FLUoxetine (PROZAC) 20 MG capsule Take 40 mg by mouth daily.     fluticasone (FLONASE) 50 MCG/ACT nasal spray INSTILL 2 SPRAYS IN EACH NOSTRIL EVERY EVENING (Patient taking differently: Place 2 sprays into both nostrils daily as needed for allergies.) 16 g 3   glucose blood (ONETOUCH VERIO) test strip Use to test blood sugars 1-2 times daily. 200 each 12   insulin glargine (LANTUS) 100 UNIT/ML injection Inject 0.3 mLs (30 Units total) into the skin daily. (Patient taking differently: Inject 50 Units  into the skin daily.) 10 mL 3   Lancets (ONETOUCH ULTRASOFT) lancets Use to test blood sugars 1-2 times daily. 100 each 12   lidocaine-prilocaine (EMLA) cream Apply to port site 1-2 hours prior to use 30 g 3   Meth-Hyo-M Bl-Na Phos-Ph Sal (URIBEL PO) Take by mouth.     Multiple Vitamin (MULTI-VITAMINS) TABS Take 1 tablet by mouth daily.     nitroGLYCERIN (NITROSTAT) 0.4 MG SL tablet Place 0.4 mg under the tongue every 5 (five) minutes as needed for chest pain.     nystatin (MYCOSTATIN/NYSTOP) powder Apply 1 application topically 3 (three) times daily. (Patient taking differently: Apply 1 application topically daily.) 15 g 3   pantoprazole (PROTONIX) 40 MG tablet Take 1 tablet (40 mg total) by mouth daily. 90 tablet 3   prochlorperazine (COMPAZINE) 10 MG tablet Take 1 tablet (10 mg total) by mouth every 6 (six) hours as needed for nausea or vomiting. 30 tablet 2   tamsulosin (FLOMAX) 0.4 MG CAPS capsule Take 0.8 mg by mouth at bedtime.     traZODone (DESYREL) 100 MG tablet Take 100 mg by mouth at bedtime.     vitamin B-12 (CYANOCOBALAMIN) 500 MCG tablet Take 500 mcg by mouth daily.     Wheat Dextrin (BENEFIBER PO) Take 1 tablet by mouth daily.     No current facility-administered medications for this visit.    Allergies:   Furosemide, Keflex [cephalexin],  Lisinopril, and Terazosin    Social History:  The patient  reports that he quit smoking about 45 years ago. His smoking use included cigarettes. He has never used smokeless tobacco. He reports that he does not currently use alcohol. He reports that he does not use drugs.   Family History:  The patient's family history includes Alcohol abuse in his brother; Arthritis in his mother and sister; Breast cancer in his sister; COPD in his brother; Cancer in his brother and sister; Depression in his daughter; Heart attack in his brother, brother, and mother; Heart disease in his brother, brother, and mother; Hyperlipidemia in his mother; Hypertension in his mother; Learning disabilities in his brother.    ROS:  Please see the history of present illness.   Otherwise, review of systems are positive for none.   All other systems are reviewed and negative.    PHYSICAL EXAM: VS:  BP 138/70   Pulse 96   Resp 18   Ht 5\' 11"  (1.803 m)   Wt 246 lb (111.6 kg)   SpO2 98%   BMI 34.31 kg/m  , BMI Body mass index is 34.31 kg/m. GEN: Well nourished, obese, in no acute distress  HEENT: normal  Neck: no JVD, carotid bruits, or masses, new Port A Cath in right upper chest.  Cardiac: RRR; no murmurs, rubs, or gallops,mild RLE edema  Respiratory:  clear to auscultation bilaterally, normal work of breathing GI: soft, nontender, nondistended, + BS MS: no deformity or atrophy  Skin: warm and dry, no rash Neuro:  Strength and sensation are intact Psych: euthymic mood, full affect   EKG:  EKG is not ordered today.    Recent Labs: 03/05/2021: TSH 1.389 07/01/2021: ALT 23 07/08/2021: Magnesium 1.9 07/26/2021: BUN 18; Creatinine, Ser 0.98; Hemoglobin 9.6; Platelets 562.0; Potassium 3.6; Sodium 137 A1c 6.3%   Lipid Panel    Component Value Date/Time   CHOL 113 10/10/2020 0940   CHOL 101 12/16/2018 1003   TRIG 166 (H) 10/10/2020 0940   HDL 34 (L) 10/10/2020 0940   HDL  38 (L) 12/16/2018 1003   CHOLHDL 3.3  10/10/2020 0940   LDLCALC 55 10/10/2020 0940      Wt Readings from Last 3 Encounters:  08/08/21 246 lb (111.6 kg)  07/26/21 248 lb (112.5 kg)  07/24/21 248 lb 3.2 oz (112.6 kg)      Other studies Reviewed: Additional studies/ records that were reviewed today include:    Echo 03/04/21: IMPRESSIONS     1. Left ventricular ejection fraction, by estimation, is 60 to 65%. The  left ventricle has normal function. The left ventricle has no regional  wall motion abnormalities. Left ventricular diastolic parameters are  indeterminate.   2. Right ventricular systolic function is normal. The right ventricular  size is normal. There is normal pulmonary artery systolic pressure.   3. The mitral valve is normal in structure. No evidence of mitral valve  regurgitation.   4. The aortic valve is normal in structure. Aortic valve regurgitation is  not visualized.   5. The inferior vena cava is normal in size with greater than 50%  respiratory variability, suggesting right atrial pressure of 3 mmHg.    ASSESSMENT AND PLAN:  Syncope Secondary to Orthostatic Hypotension- resolved - Midodrine discontinued.  - will monitor for recurrent symptoms    2. Paroxysmal Atrial Fibrillation - Maintaining sinus rhythm today. - Echo showed normal LV function. - Continue Eliquis 5mg  twice daily.    3. Right Lower Extremity Edema -- Recommended compressions stockings.    4. CAD s/p CABG in 2004 - History of CABG in 2004. Last cath in 06/2018 showed patent grafts. - No angina.  - No aspirin now that patient is on Eliquis. - Beta-blocker stopped due to severe orthostatic hypotension. - Continue high-intensity statin.   5. Hyperlipidemia - Continue Lipitor 40mg  daily.    6. Diabetes Mellitus - On Insulin at home. - Management per PCP.   7. CKD Stage III - Last creatinine 1.17 on 03/07/2021. Baseline around 1.4 to 1.5.   8. Pancreatic Cancer - Management per Oncology. - s/p pancreatectomy and  splenectomy - plan to resume chemotherapy    Current medicines are reviewed at length with the patient today.  The patient does not have concerns regarding medicines.  The following changes have been made:  no change  Labs/ tests ordered today include:  No orders of the defined types were placed in this encounter.    Disposition:   FU with me in 6 months   Signed, Yarelli Decelles [place], MD  08/08/2021 11:22 AM    [facility] [address] [place], [place], [address] Phone [phone], Fax [phone]

## 2021-08-08 ENCOUNTER — Encounter: Payer: Self-pay | Admitting: Cardiology

## 2021-08-08 ENCOUNTER — Ambulatory Visit: Payer: Medicare Other | Admitting: Cardiology

## 2021-08-08 ENCOUNTER — Other Ambulatory Visit: Payer: Self-pay

## 2021-08-08 ENCOUNTER — Ambulatory Visit (INDEPENDENT_AMBULATORY_CARE_PROVIDER_SITE_OTHER): Payer: Medicare Other | Admitting: Cardiology

## 2021-08-08 VITALS — BP 138/70 | HR 96 | Resp 18 | Ht 71.0 in | Wt 246.0 lb

## 2021-08-08 DIAGNOSIS — I951 Orthostatic hypotension: Secondary | ICD-10-CM

## 2021-08-08 DIAGNOSIS — I10 Essential (primary) hypertension: Secondary | ICD-10-CM | POA: Diagnosis not present

## 2021-08-08 DIAGNOSIS — I25810 Atherosclerosis of coronary artery bypass graft(s) without angina pectoris: Secondary | ICD-10-CM

## 2021-08-08 DIAGNOSIS — I2510 Atherosclerotic heart disease of native coronary artery without angina pectoris: Secondary | ICD-10-CM

## 2021-08-08 DIAGNOSIS — Z951 Presence of aortocoronary bypass graft: Secondary | ICD-10-CM | POA: Diagnosis not present

## 2021-08-08 DIAGNOSIS — E785 Hyperlipidemia, unspecified: Secondary | ICD-10-CM

## 2021-08-08 DIAGNOSIS — I480 Paroxysmal atrial fibrillation: Secondary | ICD-10-CM

## 2021-08-09 DIAGNOSIS — I10 Essential (primary) hypertension: Secondary | ICD-10-CM | POA: Diagnosis not present

## 2021-08-09 DIAGNOSIS — C251 Malignant neoplasm of body of pancreas: Secondary | ICD-10-CM | POA: Diagnosis not present

## 2021-08-09 DIAGNOSIS — Z794 Long term (current) use of insulin: Secondary | ICD-10-CM | POA: Diagnosis not present

## 2021-08-09 DIAGNOSIS — E440 Moderate protein-calorie malnutrition: Secondary | ICD-10-CM | POA: Diagnosis not present

## 2021-08-09 DIAGNOSIS — Z9181 History of falling: Secondary | ICD-10-CM | POA: Diagnosis not present

## 2021-08-09 DIAGNOSIS — I4891 Unspecified atrial fibrillation: Secondary | ICD-10-CM | POA: Diagnosis not present

## 2021-08-09 DIAGNOSIS — Z7901 Long term (current) use of anticoagulants: Secondary | ICD-10-CM | POA: Diagnosis not present

## 2021-08-09 DIAGNOSIS — Z951 Presence of aortocoronary bypass graft: Secondary | ICD-10-CM | POA: Diagnosis not present

## 2021-08-09 DIAGNOSIS — Z90411 Acquired partial absence of pancreas: Secondary | ICD-10-CM | POA: Diagnosis not present

## 2021-08-09 DIAGNOSIS — Z9081 Acquired absence of spleen: Secondary | ICD-10-CM | POA: Diagnosis not present

## 2021-08-09 DIAGNOSIS — F4310 Post-traumatic stress disorder, unspecified: Secondary | ICD-10-CM | POA: Diagnosis not present

## 2021-08-09 DIAGNOSIS — Z483 Aftercare following surgery for neoplasm: Secondary | ICD-10-CM | POA: Diagnosis not present

## 2021-08-09 DIAGNOSIS — Z8546 Personal history of malignant neoplasm of prostate: Secondary | ICD-10-CM | POA: Diagnosis not present

## 2021-08-09 DIAGNOSIS — E1129 Type 2 diabetes mellitus with other diabetic kidney complication: Secondary | ICD-10-CM | POA: Diagnosis not present

## 2021-08-09 DIAGNOSIS — I951 Orthostatic hypotension: Secondary | ICD-10-CM | POA: Diagnosis not present

## 2021-08-13 DIAGNOSIS — E1129 Type 2 diabetes mellitus with other diabetic kidney complication: Secondary | ICD-10-CM | POA: Diagnosis not present

## 2021-08-13 DIAGNOSIS — C251 Malignant neoplasm of body of pancreas: Secondary | ICD-10-CM | POA: Diagnosis not present

## 2021-08-13 DIAGNOSIS — I10 Essential (primary) hypertension: Secondary | ICD-10-CM | POA: Diagnosis not present

## 2021-08-13 DIAGNOSIS — I4891 Unspecified atrial fibrillation: Secondary | ICD-10-CM | POA: Diagnosis not present

## 2021-08-13 DIAGNOSIS — E440 Moderate protein-calorie malnutrition: Secondary | ICD-10-CM | POA: Diagnosis not present

## 2021-08-13 DIAGNOSIS — Z483 Aftercare following surgery for neoplasm: Secondary | ICD-10-CM | POA: Diagnosis not present

## 2021-08-14 ENCOUNTER — Encounter: Payer: Self-pay | Admitting: Nurse Practitioner

## 2021-08-14 ENCOUNTER — Other Ambulatory Visit: Payer: Self-pay

## 2021-08-14 ENCOUNTER — Inpatient Hospital Stay: Payer: Medicare Other

## 2021-08-14 ENCOUNTER — Inpatient Hospital Stay (HOSPITAL_BASED_OUTPATIENT_CLINIC_OR_DEPARTMENT_OTHER): Payer: Medicare Other | Admitting: Nurse Practitioner

## 2021-08-14 VITALS — BP 158/84 | HR 83 | Temp 97.8°F | Resp 20 | Ht 71.0 in | Wt 243.2 lb

## 2021-08-14 DIAGNOSIS — C250 Malignant neoplasm of head of pancreas: Secondary | ICD-10-CM

## 2021-08-14 DIAGNOSIS — E1129 Type 2 diabetes mellitus with other diabetic kidney complication: Secondary | ICD-10-CM | POA: Diagnosis not present

## 2021-08-14 DIAGNOSIS — I10 Essential (primary) hypertension: Secondary | ICD-10-CM | POA: Diagnosis not present

## 2021-08-14 DIAGNOSIS — K2960 Other gastritis without bleeding: Secondary | ICD-10-CM

## 2021-08-14 DIAGNOSIS — Z483 Aftercare following surgery for neoplasm: Secondary | ICD-10-CM | POA: Diagnosis not present

## 2021-08-14 DIAGNOSIS — C251 Malignant neoplasm of body of pancreas: Secondary | ICD-10-CM

## 2021-08-14 DIAGNOSIS — Z5111 Encounter for antineoplastic chemotherapy: Secondary | ICD-10-CM | POA: Diagnosis not present

## 2021-08-14 DIAGNOSIS — E440 Moderate protein-calorie malnutrition: Secondary | ICD-10-CM | POA: Diagnosis not present

## 2021-08-14 DIAGNOSIS — I4891 Unspecified atrial fibrillation: Secondary | ICD-10-CM | POA: Diagnosis not present

## 2021-08-14 LAB — CBC WITH DIFFERENTIAL (CANCER CENTER ONLY)
Abs Immature Granulocytes: 0.02 10*3/uL (ref 0.00–0.07)
Basophils Absolute: 0.1 10*3/uL (ref 0.0–0.1)
Basophils Relative: 1 %
Eosinophils Absolute: 0.1 10*3/uL (ref 0.0–0.5)
Eosinophils Relative: 2 %
HCT: 36.7 % — ABNORMAL LOW (ref 39.0–52.0)
Hemoglobin: 10.8 g/dL — ABNORMAL LOW (ref 13.0–17.0)
Immature Granulocytes: 0 %
Lymphocytes Relative: 40 %
Lymphs Abs: 3.4 10*3/uL (ref 0.7–4.0)
MCH: 26.2 pg (ref 26.0–34.0)
MCHC: 29.4 g/dL — ABNORMAL LOW (ref 30.0–36.0)
MCV: 89.1 fL (ref 80.0–100.0)
Monocytes Absolute: 0.8 10*3/uL (ref 0.1–1.0)
Monocytes Relative: 10 %
Neutro Abs: 4 10*3/uL (ref 1.7–7.7)
Neutrophils Relative %: 47 %
Platelet Count: 405 10*3/uL — ABNORMAL HIGH (ref 150–400)
RBC: 4.12 MIL/uL — ABNORMAL LOW (ref 4.22–5.81)
RDW: 17.6 % — ABNORMAL HIGH (ref 11.5–15.5)
WBC Count: 8.5 10*3/uL (ref 4.0–10.5)
nRBC: 0 % (ref 0.0–0.2)

## 2021-08-14 LAB — CMP (CANCER CENTER ONLY)
ALT: 14 U/L (ref 0–44)
AST: 24 U/L (ref 15–41)
Albumin: 3.4 g/dL — ABNORMAL LOW (ref 3.5–5.0)
Alkaline Phosphatase: 84 U/L (ref 38–126)
Anion gap: 9 (ref 5–15)
BUN: 19 mg/dL (ref 8–23)
CO2: 27 mmol/L (ref 22–32)
Calcium: 9.4 mg/dL (ref 8.9–10.3)
Chloride: 104 mmol/L (ref 98–111)
Creatinine: 1.03 mg/dL (ref 0.61–1.24)
GFR, Estimated: >60 mL/min (ref >60)
Glucose, Bld: 141 mg/dL — ABNORMAL HIGH (ref 70–99)
Potassium: 3.2 mmol/L — ABNORMAL LOW (ref 3.5–5.1)
Sodium: 140 mmol/L (ref 135–145)
Total Bilirubin: 0.5 mg/dL (ref 0.3–1.2)
Total Protein: 7 g/dL (ref 6.5–8.1)

## 2021-08-14 MED ORDER — PACLITAXEL PROTEIN-BOUND CHEMO INJECTION 100 MG
100.0000 mg/m2 | Freq: Once | INTRAVENOUS | Status: AC
  Administered 2021-08-14: 250 mg via INTRAVENOUS
  Filled 2021-08-14: qty 50

## 2021-08-14 MED ORDER — SODIUM CHLORIDE 0.9% FLUSH
10.0000 mL | INTRAVENOUS | Status: DC | PRN
  Administered 2021-08-14: 10 mL

## 2021-08-14 MED ORDER — SODIUM CHLORIDE 0.9 % IV SOLN: Freq: Once | INTRAVENOUS | Status: AC

## 2021-08-14 MED ORDER — HEPARIN SOD (PORK) LOCK FLUSH 100 UNIT/ML IV SOLN
500.0000 [IU] | Freq: Once | INTRAVENOUS | Status: AC | PRN
  Administered 2021-08-14: 500 [IU]

## 2021-08-14 MED ORDER — SODIUM CHLORIDE 0.9 % IV SOLN
1000.0000 mg/m2 | Freq: Once | INTRAVENOUS | Status: AC
  Administered 2021-08-14: 2394 mg via INTRAVENOUS
  Filled 2021-08-14: qty 52.6

## 2021-08-14 MED ORDER — ONDANSETRON HCL 8 MG PO TABS
8.0000 mg | ORAL_TABLET | Freq: Once | ORAL | Status: AC
  Administered 2021-08-14: 8 mg via ORAL
  Filled 2021-08-14: qty 1

## 2021-08-14 MED ORDER — POTASSIUM CHLORIDE CRYS ER 20 MEQ PO TBCR: 20.0000 meq | EXTENDED_RELEASE_TABLET | Freq: Every day | ORAL | 0 refills | Status: DC

## 2021-08-14 MED ORDER — DEXAMETHASONE SODIUM PHOSPHATE 10 MG/ML IJ SOLN
5.0000 mg | Freq: Once | INTRAMUSCULAR | Status: AC
  Administered 2021-08-14: 5 mg via INTRAVENOUS
  Filled 2021-08-14: qty 1

## 2021-08-14 NOTE — Progress Notes (Signed)
  [facility] OFFICE PROGRESS NOTE   Diagnosis: Pancreas cancer  INTERVAL HISTORY:   Mr. Scobey returns as scheduled.  He denies nausea/vomiting.  No mouth sores.  No diarrhea.  Stable neuropathy symptoms in the feet, predates chemotherapy.  Describes appetite as "so-so".  Objective:  Vital signs in last 24 hours:  Blood pressure (!) 158/84, pulse 83, temperature 97.8 F (36.6 C), temperature source Oral, resp. rate 20, height '5\' 11"'$  (1.803 m), weight 243 lb 3.2 oz (110.3 kg), SpO2 97 %.    HEENT: No thrush or ulcers. Resp: Lungs clear bilaterally. Cardio: Regular rate and rhythm. GI: Abdomen soft and nontender.  No hepatomegaly.  Healed midline surgical scar. Vascular: Very minimal bilateral pretibial edema. Skin: No rash. Port-A-Cath without erythema.   Lab Results:  Lab Results  Component Value Date   WBC 8.5 08/14/2021   HGB 10.8 (L) 08/14/2021   HCT 36.7 (L) 08/14/2021   MCV 89.1 08/14/2021   PLT 405 (H) 08/14/2021   NEUTROABS 4.0 08/14/2021    Imaging:  No results found.  Medications: I have reviewed the patient's current medications.  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  No pancreatic ductal dilatation identified.  No definite signs of metastatic disease in the chest, abdomen or pelvis. Cycle 1 gemcitabine/Abraxane 03/01/2021 Cycle 2  gemcitabine/Abraxane 03/14/2021 Cycle 3 gemcitabine/Abraxane 03/28/2021 Cycle 4 gemcitabine/Abraxane 04/17/2021 Cycle 5 gemcitabine/Abraxane 05/01/2021, Zofran/Decadron added Cycle 6 gemcitabine/Abraxane 05/15/2021 CT pancreas protocol 05/27/2021-unchanged 8 mm exophytic low-attenuation lesion at the posterior body of the pancreas 07/01/2021-distal pancreatectomy/splenectomy, 0.8 cm poorly differentiated adenocarcinoma, treatment response score-2, largest single foci of remaining tumor 0.2 cm, negative resection margins, 0/6 lymph nodes,ypT1bypNo, PanIN-1b Cycle 7 gemcitabine/Abraxane 08/14/2021 Diabetes Coronary artery disease Prostate cancer 2013-treated with radiation at [facility] Hypertension Sleep apnea 7.  Coronary artery bypass surgery 2004 8.  Hospital admission 03/04/2021-syncope, A. Fib-started on Eliquis 9.  Mild thrombocytopenia following cycle 1 gemcitabine/Abraxane-resolved 10.  Anemia    Disposition: Mr. Argudo appears stable.  He is scheduled to resume treatment with gemcitabine/Abraxane today.  We reviewed potential side effects.  He agrees to proceed.  We reviewed the CBC and chemistry panel from today.  Labs adequate to proceed as above.  He will return for lab, follow-up, Gemcitabine/Abraxane in 2 weeks.    Ned Card ANP/GNP-BC   08/14/2021  10:54 AM

## 2021-08-14 NOTE — Patient Instructions (Signed)
[facility]   Discharge Instructions: Thank you for choosing [facility] to provide your oncology and hematology care.   If you have a lab appointment with the [facility], please go directly to the [facility] and check in at the registration area.   Wear comfortable clothing and clothing appropriate for easy access to any Portacath or PICC line.   We strive to give you quality time with your provider. You may need to reschedule your appointment if you arrive late (15 or more minutes).  Arriving late affects you and other patients whose appointments are after yours.  Also, if you miss three or more appointments without notifying the office, you may be dismissed from the clinic at the provider's discretion.      For prescription refill requests, have your pharmacy contact our office and allow 72 hours for refills to be completed.    Today you received the following chemotherapy and/or immunotherapy agents Paclitaxel-protein bound (ABRAXANE) & Gemcitabine (GEMZAR).      To help prevent nausea and vomiting after your treatment, we encourage you to take your nausea medication as directed.  BELOW ARE SYMPTOMS THAT SHOULD BE REPORTED IMMEDIATELY: *FEVER GREATER THAN 100.4 F (38 C) OR HIGHER *CHILLS OR SWEATING *NAUSEA AND VOMITING THAT IS NOT CONTROLLED WITH YOUR NAUSEA MEDICATION *UNUSUAL SHORTNESS OF BREATH *UNUSUAL BRUISING OR BLEEDING *URINARY PROBLEMS (pain or burning when urinating, or frequent urination) *BOWEL PROBLEMS (unusual diarrhea, constipation, pain near the anus) TENDERNESS IN MOUTH AND THROAT WITH OR WITHOUT PRESENCE OF ULCERS (sore throat, sores in mouth, or a toothache) UNUSUAL RASH, SWELLING OR PAIN  UNUSUAL VAGINAL DISCHARGE OR ITCHING   Items with * indicate a potential emergency and should be followed up as soon as possible or go to the Emergency Department if any problems should occur.  Please show the CHEMOTHERAPY ALERT  CARD or IMMUNOTHERAPY ALERT CARD at check-in to the Emergency Department and triage nurse.  Should you have questions after your visit or need to cancel or reschedule your appointment, please contact [facility]  Dept: [phone]  and follow the prompts.  Office hours are 8:00 a.m. to 4:30 p.m. Monday - Friday. Please note that voicemails left after 4:00 p.m. may not be returned until the following business day.  We are closed weekends and major holidays. You have access to a nurse at all times for urgent questions. Please call the main number to the clinic Dept: [phone] and follow the prompts.   For any non-urgent questions, you may also contact your provider using MyChart. We now offer e-Visits for anyone 26 and older to request care online for non-urgent symptoms. For details visit [email or web address].   Also download the MyChart app! Go to the app store, search "MyChart", open the app, select [facility], and log in with your MyChart username and password.  Due to Covid, a mask is required upon entering the hospital/clinic. If you do not have a mask, one will be given to you upon arrival. For doctor visits, patients may have 1 support person aged 35 or older with them. For treatment visits, patients cannot have anyone with them due to current Covid guidelines and our immunocompromised population.   Nanoparticle Albumin-Bound Paclitaxel injection What is this medication? NANOPARTICLE ALBUMIN-BOUND PACLITAXEL (Na no PAHR ti kuhl al BYOO muhn-bound PAK li TAX el) is a chemotherapy drug. It targets fast dividing cells, like cancer cells, and causes these cells to die. This medicine  is used to treat advanced breast cancer, lung cancer, and pancreatic cancer. This medicine may be used for other purposes; ask your health care provider or pharmacist if you have questions. COMMON BRAND NAME(S): Abraxane What should I tell my care team before I take this  medication? They need to know if you have any of these conditions: kidney disease liver disease low blood counts, like low white cell, platelet, or red cell counts lung or breathing disease, like asthma tingling of the fingers or toes, or other nerve disorder an unusual or allergic reaction to paclitaxel, albumin, other chemotherapy, other medicines, foods, dyes, or preservatives pregnant or trying to get pregnant breast-feeding How should I use this medication? This drug is given as an infusion into a vein. It is administered in a hospital or clinic by a specially trained health care professional. Talk to your pediatrician regarding the use of this medicine in children. Special care may be needed. Overdosage: If you think you have taken too much of this medicine contact a poison control center or emergency room at once. NOTE: This medicine is only for you. Do not share this medicine with others. What if I miss a dose? It is important not to miss your dose. Call your doctor or health care professional if you are unable to keep an appointment. What may interact with this medication? This medicine may interact with the following medications: antiviral medicines for hepatitis, HIV or AIDS certain antibiotics like erythromycin and clarithromycin certain medicines for fungal infections like ketoconazole and itraconazole certain medicines for seizures like carbamazepine, phenobarbital, phenytoin gemfibrozil nefazodone rifampin St. John's wort This list may not describe all possible interactions. Give your health care provider a list of all the medicines, herbs, non-prescription drugs, or dietary supplements you use. Also tell them if you smoke, drink alcohol, or use illegal drugs. Some items may interact with your medicine. What should I watch for while using this medication? Your condition will be monitored carefully while you are receiving this medicine. You will need important blood work  done while you are taking this medicine. This medicine can cause serious allergic reactions. If you experience allergic reactions like skin rash, itching or hives, swelling of the face, lips, or tongue, tell your doctor or health care professional right away. In some cases, you may be given additional medicines to help with side effects. Follow all directions for their use. This drug may make you feel generally unwell. This is not uncommon, as chemotherapy can affect healthy cells as well as cancer cells. Report any side effects. Continue your course of treatment even though you feel ill unless your doctor tells you to stop. Call your doctor or health care professional for advice if you get a fever, chills or sore throat, or other symptoms of a cold or flu. Do not treat yourself. This drug decreases your body's ability to fight infections. Try to avoid being around people who are sick. This medicine may increase your risk to bruise or bleed. Call your doctor or health care professional if you notice any unusual bleeding. Be careful brushing and flossing your teeth or using a toothpick because you may get an infection or bleed more easily. If you have any dental work done, tell your dentist you are receiving this medicine. Avoid taking products that contain aspirin, acetaminophen, ibuprofen, naproxen, or ketoprofen unless instructed by your doctor. These medicines may hide a fever. Do not become pregnant while taking this medicine or for 6 months after stopping it.  Women should inform their doctor if they wish to become pregnant or think they might be pregnant. Men should not father a child while taking this medicine or for 3 months after stopping it. There is a potential for serious side effects to an unborn child. Talk to your health care professional or pharmacist for more information. Do not breast-feed an infant while taking this medicine or for 2 weeks after stopping it. This medicine may interfere  with the ability to get pregnant or to father a child. You should talk to your doctor or health care professional if you are concerned about your fertility. What side effects may I notice from receiving this medication? Side effects that you should report to your doctor or health care professional as soon as possible: allergic reactions like skin rash, itching or hives, swelling of the face, lips, or tongue breathing problems changes in vision fast, irregular heartbeat low blood pressure mouth sores pain, tingling, numbness in the hands or feet signs of decreased platelets or bleeding - bruising, pinpoint red spots on the skin, black, tarry stools, blood in the urine signs of decreased red blood cells - unusually weak or tired, feeling faint or lightheaded, falls signs of infection - fever or chills, cough, sore throat, pain or difficulty passing urine signs and symptoms of liver injury like dark yellow or brown urine; general ill feeling or flu-like symptoms; light-colored stools; loss of appetite; nausea; right upper belly pain; unusually weak or tired; yellowing of the eyes or skin swelling of the ankles, feet, hands unusually slow heartbeat Side effects that usually do not require medical attention (report to your doctor or health care professional if they continue or are bothersome): diarrhea hair loss loss of appetite nausea, vomiting tiredness This list may not describe all possible side effects. Call your doctor for medical advice about side effects. You may report side effects to FDA at 1-800-FDA-1088. Where should I keep my medication? This drug is given in a hospital or clinic and will not be stored at home. NOTE: This sheet is a summary. It may not cover all possible information. If you have questions about this medicine, talk to your doctor, pharmacist, or health care provider.  2022 Elsevier/Gold Standard (2017-08-04 13:03:45)  Gemcitabine injection What is this  medication? GEMCITABINE (jem SYE ta been) is a chemotherapy drug. This medicine is used to treat many types of cancer like breast cancer, lung cancer, pancreatic cancer, and ovarian cancer. This medicine may be used for other purposes; ask your health care provider or pharmacist if you have questions. COMMON BRAND NAME(S): Gemzar, Infugem What should I tell my care team before I take this medication? They need to know if you have any of these conditions: blood disorders infection kidney disease liver disease lung or breathing disease, like asthma recent or ongoing radiation therapy an unusual or allergic reaction to gemcitabine, other chemotherapy, other medicines, foods, dyes, or preservatives pregnant or trying to get pregnant breast-feeding How should I use this medication? This drug is given as an infusion into a vein. It is administered in a hospital or clinic by a specially trained health care professional. Talk to your pediatrician regarding the use of this medicine in children. Special care may be needed. Overdosage: If you think you have taken too much of this medicine contact a poison control center or emergency room at once. NOTE: This medicine is only for you. Do not share this medicine with others. What if I miss a dose? It is  important not to miss your dose. Call your doctor or health care professional if you are unable to keep an appointment. What may interact with this medication? medicines to increase blood counts like filgrastim, pegfilgrastim, sargramostim some other chemotherapy drugs like cisplatin vaccines Talk to your doctor or health care professional before taking any of these medicines: acetaminophen aspirin ibuprofen ketoprofen naproxen This list may not describe all possible interactions. Give your health care provider a list of all the medicines, herbs, non-prescription drugs, or dietary supplements you use. Also tell them if you smoke, drink alcohol, or  use illegal drugs. Some items may interact with your medicine. What should I watch for while using this medication? Visit your doctor for checks on your progress. This drug may make you feel generally unwell. This is not uncommon, as chemotherapy can affect healthy cells as well as cancer cells. Report any side effects. Continue your course of treatment even though you feel ill unless your doctor tells you to stop. In some cases, you may be given additional medicines to help with side effects. Follow all directions for their use. Call your doctor or health care professional for advice if you get a fever, chills or sore throat, or other symptoms of a cold or flu. Do not treat yourself. This drug decreases your body's ability to fight infections. Try to avoid being around people who are sick. This medicine may increase your risk to bruise or bleed. Call your doctor or health care professional if you notice any unusual bleeding. Be careful brushing and flossing your teeth or using a toothpick because you may get an infection or bleed more easily. If you have any dental work done, tell your dentist you are receiving this medicine. Avoid taking products that contain aspirin, acetaminophen, ibuprofen, naproxen, or ketoprofen unless instructed by your doctor. These medicines may hide a fever. Do not become pregnant while taking this medicine or for 6 months after stopping it. Women should inform their doctor if they wish to become pregnant or think they might be pregnant. Men should not father a child while taking this medicine and for 3 months after stopping it. There is a potential for serious side effects to an unborn child. Talk to your health care professional or pharmacist for more information. Do not breast-feed an infant while taking this medicine or for at least 1 week after stopping it. Men should inform their doctors if they wish to father a child. This medicine may lower sperm counts. Talk with your  doctor or health care professional if you are concerned about your fertility. What side effects may I notice from receiving this medication? Side effects that you should report to your doctor or health care professional as soon as possible: allergic reactions like skin rash, itching or hives, swelling of the face, lips, or tongue breathing problems pain, redness, or irritation at site where injected signs and symptoms of a dangerous change in heartbeat or heart rhythm like chest pain; dizziness; fast or irregular heartbeat; palpitations; feeling faint or lightheaded, falls; breathing problems signs of decreased platelets or bleeding - bruising, pinpoint red spots on the skin, black, tarry stools, blood in the urine signs of decreased red blood cells - unusually weak or tired, feeling faint or lightheaded, falls signs of infection - fever or chills, cough, sore throat, pain or difficulty passing urine signs and symptoms of kidney injury like trouble passing urine or change in the amount of urine signs and symptoms of liver injury like dark  yellow or brown urine; general ill feeling or flu-like symptoms; light-colored stools; loss of appetite; nausea; right upper belly pain; unusually weak or tired; yellowing of the eyes or skin swelling of ankles, feet, hands Side effects that usually do not require medical attention (report to your doctor or health care professional if they continue or are bothersome): constipation diarrhea hair loss loss of appetite nausea rash vomiting This list may not describe all possible side effects. Call your doctor for medical advice about side effects. You may report side effects to FDA at 1-800-FDA-1088. Where should I keep my medication? This drug is given in a hospital or clinic and will not be stored at home. NOTE: This sheet is a summary. It may not cover all possible information. If you have questions about this medicine, talk to your doctor, pharmacist, or  health care provider.  2022 Elsevier/Gold Standard (2018-02-24 18:06:11)

## 2021-08-24 ENCOUNTER — Other Ambulatory Visit: Payer: Self-pay | Admitting: Oncology

## 2021-08-28 ENCOUNTER — Other Ambulatory Visit: Payer: Self-pay

## 2021-08-28 ENCOUNTER — Inpatient Hospital Stay (HOSPITAL_BASED_OUTPATIENT_CLINIC_OR_DEPARTMENT_OTHER): Payer: Medicare Other | Admitting: Oncology

## 2021-08-28 ENCOUNTER — Inpatient Hospital Stay: Payer: Medicare Other

## 2021-08-28 ENCOUNTER — Inpatient Hospital Stay: Payer: Medicare Other | Attending: Nurse Practitioner

## 2021-08-28 VITALS — BP 142/77 | HR 93 | Temp 98.1°F | Resp 20 | Ht 71.0 in | Wt 248.2 lb

## 2021-08-28 DIAGNOSIS — Z95828 Presence of other vascular implants and grafts: Secondary | ICD-10-CM | POA: Diagnosis not present

## 2021-08-28 DIAGNOSIS — C251 Malignant neoplasm of body of pancreas: Secondary | ICD-10-CM | POA: Diagnosis not present

## 2021-08-28 DIAGNOSIS — K2960 Other gastritis without bleeding: Secondary | ICD-10-CM

## 2021-08-28 DIAGNOSIS — D709 Neutropenia, unspecified: Secondary | ICD-10-CM | POA: Insufficient documentation

## 2021-08-28 DIAGNOSIS — Z5111 Encounter for antineoplastic chemotherapy: Secondary | ICD-10-CM | POA: Diagnosis not present

## 2021-08-28 DIAGNOSIS — C250 Malignant neoplasm of head of pancreas: Secondary | ICD-10-CM

## 2021-08-28 LAB — CBC WITH DIFFERENTIAL (CANCER CENTER ONLY)
Abs Immature Granulocytes: 0.01 10*3/uL (ref 0.00–0.07)
Basophils Absolute: 0 10*3/uL (ref 0.0–0.1)
Basophils Relative: 1 %
Eosinophils Absolute: 0.1 10*3/uL (ref 0.0–0.5)
Eosinophils Relative: 1 %
HCT: 33.3 % — ABNORMAL LOW (ref 39.0–52.0)
Hemoglobin: 10.1 g/dL — ABNORMAL LOW (ref 13.0–17.0)
Immature Granulocytes: 0 %
Lymphocytes Relative: 59 %
Lymphs Abs: 2.6 10*3/uL (ref 0.7–4.0)
MCH: 26.3 pg (ref 26.0–34.0)
MCHC: 30.3 g/dL (ref 30.0–36.0)
MCV: 86.7 fL (ref 80.0–100.0)
Monocytes Absolute: 0.7 10*3/uL (ref 0.1–1.0)
Monocytes Relative: 15 %
Neutro Abs: 1 10*3/uL — ABNORMAL LOW (ref 1.7–7.7)
Neutrophils Relative %: 24 %
Platelet Count: 373 10*3/uL (ref 150–400)
RBC: 3.84 MIL/uL — ABNORMAL LOW (ref 4.22–5.81)
RDW: 17.7 % — ABNORMAL HIGH (ref 11.5–15.5)
WBC Count: 4.4 10*3/uL (ref 4.0–10.5)
nRBC: 0 % (ref 0.0–0.2)

## 2021-08-28 LAB — CMP (CANCER CENTER ONLY)
ALT: 17 U/L (ref 0–44)
AST: 22 U/L (ref 15–41)
Albumin: 3.5 g/dL (ref 3.5–5.0)
Alkaline Phosphatase: 82 U/L (ref 38–126)
Anion gap: 9 (ref 5–15)
BUN: 13 mg/dL (ref 8–23)
CO2: 25 mmol/L (ref 22–32)
Calcium: 9.4 mg/dL (ref 8.9–10.3)
Chloride: 106 mmol/L (ref 98–111)
Creatinine: 0.97 mg/dL (ref 0.61–1.24)
GFR, Estimated: >60 mL/min (ref >60)
Glucose, Bld: 139 mg/dL — ABNORMAL HIGH (ref 70–99)
Potassium: 3.7 mmol/L (ref 3.5–5.1)
Sodium: 140 mmol/L (ref 135–145)
Total Bilirubin: 0.3 mg/dL (ref 0.3–1.2)
Total Protein: 6.8 g/dL (ref 6.5–8.1)

## 2021-08-28 MED ORDER — SODIUM CHLORIDE 0.9% FLUSH
10.0000 mL | Freq: Once | INTRAVENOUS | Status: AC
  Administered 2021-08-28: 10 mL

## 2021-08-28 MED ORDER — HEPARIN SOD (PORK) LOCK FLUSH 100 UNIT/ML IV SOLN
500.0000 [IU] | Freq: Once | INTRAVENOUS | Status: AC
  Administered 2021-08-28: 500 [IU]

## 2021-08-28 NOTE — Progress Notes (Signed)
[facility] OFFICE PROGRESS NOTE   Diagnosis: Pancreas cancer  INTERVAL HISTORY:   [name] completed another cycle of gemcitabine/Abraxane on 08/14/2021.  No nausea, fever, rash, or neuropathy symptoms.  He feels well.  No complaint.  Objective:  Vital signs in last 24 hours:  Blood pressure (!) 142/77, pulse 93, temperature 98.1 F (36.7 C), temperature source Oral, resp. rate 20, height '5\' 11"'$  (1.803 m), weight 248 lb 3.2 oz (112.6 kg), SpO2 100 %.    HEENT: No thrush, healing ulcer of the right buccal mucosa Resp: Lungs clear bilaterally Cardio: Regular rate and rhythm GI: No hepatosplenomegaly Vascular: Trace edema at the right lower leg   Portacath/PICC-without erythema  Lab Results:  Lab Results  Component Value Date   WBC 4.4 08/28/2021   HGB 10.1 (L) 08/28/2021   HCT 33.3 (L) 08/28/2021   MCV 86.7 08/28/2021   PLT 373 08/28/2021   NEUTROABS 1.0 (L) 08/28/2021    CMP  Lab Results  Component Value Date   NA 140 08/14/2021   K 3.2 (L) 08/14/2021   CL 104 08/14/2021   CO2 27 08/14/2021   GLUCOSE 141 (H) 08/14/2021   BUN 19 08/14/2021   CREATININE 1.03 08/14/2021   CALCIUM 9.4 08/14/2021   PROT 7.0 08/14/2021   ALBUMIN 3.4 (L) 08/14/2021   AST 24 08/14/2021   ALT 14 08/14/2021   ALKPHOS 84 08/14/2021   BILITOT 0.5 08/14/2021   GFRNONAA >60 08/14/2021   GFRAA 49 (L) 10/10/2020    No results found for: CEA1, CEA, [id number], CA125   Medications: I have reviewed the patient's current medications.   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  No pancreatic ductal dilatation identified.  No definite signs of metastatic disease in the chest, abdomen or pelvis. Cycle 1 gemcitabine/Abraxane 03/01/2021 Cycle 2 gemcitabine/Abraxane 03/14/2021 Cycle 3 gemcitabine/Abraxane 03/28/2021 Cycle 4 gemcitabine/Abraxane 04/17/2021 Cycle 5 gemcitabine/Abraxane 05/01/2021, Zofran/Decadron added Cycle 6 gemcitabine/Abraxane 05/15/2021 CT pancreas protocol 05/27/2021-unchanged 8 mm exophytic low-attenuation lesion at the posterior body of the pancreas 07/01/2021-distal pancreatectomy/splenectomy, 0.8 cm poorly differentiated adenocarcinoma, treatment response score-2, largest single foci of remaining tumor 0.2 cm, negative resection margins, 0/6 lymph nodes,ypT1bypNo, PanIN-1b Cycle 7 gemcitabine/Abraxane 08/14/2021 Diabetes Coronary artery disease Prostate cancer 2013-[facility] with [facility] Hypertension Sleep apnea 7.  Coronary artery bypass surgery 2004 8.  Hospital admission 03/04/2021-syncope, A. Fib-started on Eliquis 9.  Mild thrombocytopenia following cycle 1 gemcitabine/Abraxane-resolved 10.  Anemia      Disposition: [name] appears stable.  He tolerated the last cycle of chemotherapy well.  He has moderate neutropenia today.  Chemotherapy will be held.  He will return for the next cycle of gemcitabine/Abraxane in 1 week.  Ellen Henri will be added with the next cycle of chemotherapy.  We reviewed potential toxicities associated with Udenyca including the chance of bone pain and rash.  He agrees to proceed.  He will be scheduled for an office visit prior to chemotherapy in 3 weeks.  Betsy Coder, MD  08/28/2021  9:39  AM    

## 2021-09-04 ENCOUNTER — Inpatient Hospital Stay: Payer: Medicare Other

## 2021-09-04 ENCOUNTER — Other Ambulatory Visit: Payer: Self-pay

## 2021-09-04 VITALS — BP 157/82 | HR 75 | Temp 98.8°F | Resp 20 | Ht 71.0 in | Wt 248.3 lb

## 2021-09-04 DIAGNOSIS — D709 Neutropenia, unspecified: Secondary | ICD-10-CM | POA: Diagnosis not present

## 2021-09-04 DIAGNOSIS — C251 Malignant neoplasm of body of pancreas: Secondary | ICD-10-CM | POA: Diagnosis not present

## 2021-09-04 DIAGNOSIS — K2960 Other gastritis without bleeding: Secondary | ICD-10-CM

## 2021-09-04 DIAGNOSIS — Z5111 Encounter for antineoplastic chemotherapy: Secondary | ICD-10-CM | POA: Diagnosis not present

## 2021-09-04 DIAGNOSIS — C250 Malignant neoplasm of head of pancreas: Secondary | ICD-10-CM

## 2021-09-04 LAB — CMP (CANCER CENTER ONLY)
ALT: 15 U/L (ref 0–44)
AST: 19 U/L (ref 15–41)
Albumin: 3.5 g/dL (ref 3.5–5.0)
Alkaline Phosphatase: 80 U/L (ref 38–126)
Anion gap: 8 (ref 5–15)
BUN: 16 mg/dL (ref 8–23)
CO2: 26 mmol/L (ref 22–32)
Calcium: 9.2 mg/dL (ref 8.9–10.3)
Chloride: 104 mmol/L (ref 98–111)
Creatinine: 0.97 mg/dL (ref 0.61–1.24)
GFR, Estimated: >60 mL/min (ref >60)
Glucose, Bld: 260 mg/dL — ABNORMAL HIGH (ref 70–99)
Potassium: 3.7 mmol/L (ref 3.5–5.1)
Sodium: 138 mmol/L (ref 135–145)
Total Bilirubin: 0.3 mg/dL (ref 0.3–1.2)
Total Protein: 6.7 g/dL (ref 6.5–8.1)

## 2021-09-04 LAB — CBC WITH DIFFERENTIAL (CANCER CENTER ONLY)
Abs Immature Granulocytes: 0.02 10*3/uL (ref 0.00–0.07)
Basophils Absolute: 0 10*3/uL (ref 0.0–0.1)
Basophils Relative: 0 %
Eosinophils Absolute: 0.1 10*3/uL (ref 0.0–0.5)
Eosinophils Relative: 1 %
HCT: 34.9 % — ABNORMAL LOW (ref 39.0–52.0)
Hemoglobin: 10.5 g/dL — ABNORMAL LOW (ref 13.0–17.0)
Immature Granulocytes: 0 %
Lymphocytes Relative: 31 %
Lymphs Abs: 3.1 10*3/uL (ref 0.7–4.0)
MCH: 26.4 pg (ref 26.0–34.0)
MCHC: 30.1 g/dL (ref 30.0–36.0)
MCV: 87.7 fL (ref 80.0–100.0)
Monocytes Absolute: 1.1 10*3/uL — ABNORMAL HIGH (ref 0.1–1.0)
Monocytes Relative: 11 %
Neutro Abs: 5.9 10*3/uL (ref 1.7–7.7)
Neutrophils Relative %: 57 %
Platelet Count: 468 10*3/uL — ABNORMAL HIGH (ref 150–400)
RBC: 3.98 MIL/uL — ABNORMAL LOW (ref 4.22–5.81)
RDW: 18.8 % — ABNORMAL HIGH (ref 11.5–15.5)
WBC Count: 10.2 10*3/uL (ref 4.0–10.5)
nRBC: 0 % (ref 0.0–0.2)

## 2021-09-04 MED ORDER — DEXAMETHASONE SODIUM PHOSPHATE 10 MG/ML IJ SOLN
5.0000 mg | Freq: Once | INTRAMUSCULAR | Status: AC
  Administered 2021-09-04: 5 mg via INTRAVENOUS
  Filled 2021-09-04: qty 1

## 2021-09-04 MED ORDER — ONDANSETRON HCL 8 MG PO TABS
8.0000 mg | ORAL_TABLET | Freq: Once | ORAL | Status: AC
  Administered 2021-09-04: 8 mg via ORAL
  Filled 2021-09-04: qty 1

## 2021-09-04 MED ORDER — SODIUM CHLORIDE 0.9 % IV SOLN
1000.0000 mg/m2 | Freq: Once | INTRAVENOUS | Status: AC
  Administered 2021-09-04: 2394 mg via INTRAVENOUS
  Filled 2021-09-04: qty 52.6

## 2021-09-04 MED ORDER — HEPARIN SOD (PORK) LOCK FLUSH 100 UNIT/ML IV SOLN
500.0000 [IU] | Freq: Once | INTRAVENOUS | Status: AC | PRN
  Administered 2021-09-04: 500 [IU]

## 2021-09-04 MED ORDER — SODIUM CHLORIDE 0.9 % IV SOLN: Freq: Once | INTRAVENOUS | Status: AC

## 2021-09-04 MED ORDER — SODIUM CHLORIDE 0.9% FLUSH
10.0000 mL | INTRAVENOUS | Status: DC | PRN
  Administered 2021-09-04: 10 mL

## 2021-09-04 MED ORDER — PACLITAXEL PROTEIN-BOUND CHEMO INJECTION 100 MG
100.0000 mg/m2 | Freq: Once | INTRAVENOUS | Status: AC
  Administered 2021-09-04: 250 mg via INTRAVENOUS
  Filled 2021-09-04: qty 50

## 2021-09-04 NOTE — Progress Notes (Signed)
Patient presents for treatment. RN assessment completed along with the following:  Treatment Conditions (labs/vitals/weight) reviewed - Yes, and within treatment parameters.   Oncology Treatment Attestation completed for current therapy- Yes, on date 02/15/2021 Informed consent completed and reflects current therapy/intent - Yes, on date 03/01/2021             Provider progress note reviewed - Patient not seen by provider today. Most recent note dated 08/28/2021 reviewed. Treatment/Antibody/Supportive plan reviewed - Yes, and there are no adjustments needed for today's treatment. S&H and other orders reviewed - Yes, and there are no additional orders identified. Previous treatment date reviewed - Yes, and the appropriate amount of time has elapsed between treatments. Clinic Hand Off Received from - NO  Patient to proceed with treatment.

## 2021-09-04 NOTE — Patient Instructions (Signed)
[facility]   Discharge Instructions: Thank you for choosing [facility] to provide your oncology and hematology care.   If you have a lab appointment with the [facility], please go directly to the [facility] and check in at the registration area.   Wear comfortable clothing and clothing appropriate for easy access to any Portacath or PICC line.   We strive to give you quality time with your provider. You may need to reschedule your appointment if you arrive late (15 or more minutes).  Arriving late affects you and other patients whose appointments are after yours.  Also, if you miss three or more appointments without notifying the office, you may be dismissed from the clinic at the provider's discretion.      For prescription refill requests, have your pharmacy contact our office and allow 72 hours for refills to be completed.    Today you received the following chemotherapy and/or immunotherapy agents Paclitaxel-protein bound (ABRAXANE) & Gemcitabine (GEMZAR).      To help prevent nausea and vomiting after your treatment, we encourage you to take your nausea medication as directed.  BELOW ARE SYMPTOMS THAT SHOULD BE REPORTED IMMEDIATELY: *FEVER GREATER THAN 100.4 F (38 C) OR HIGHER *CHILLS OR SWEATING *NAUSEA AND VOMITING THAT IS NOT CONTROLLED WITH YOUR NAUSEA MEDICATION *UNUSUAL SHORTNESS OF BREATH *UNUSUAL BRUISING OR BLEEDING *URINARY PROBLEMS (pain or burning when urinating, or frequent urination) *BOWEL PROBLEMS (unusual diarrhea, constipation, pain near the anus) TENDERNESS IN MOUTH AND THROAT WITH OR WITHOUT PRESENCE OF ULCERS (sore throat, sores in mouth, or a toothache) UNUSUAL RASH, SWELLING OR PAIN  UNUSUAL VAGINAL DISCHARGE OR ITCHING   Items with * indicate a potential emergency and should be followed up as soon as possible or go to the Emergency Department if any problems should occur.  Please show the CHEMOTHERAPY ALERT  CARD or IMMUNOTHERAPY ALERT CARD at check-in to the Emergency Department and triage nurse.  Should you have questions after your visit or need to cancel or reschedule your appointment, please contact [facility]  Dept: [phone]  and follow the prompts.  Office hours are 8:00 a.m. to 4:30 p.m. Monday - Friday. Please note that voicemails left after 4:00 p.m. may not be returned until the following business day.  We are closed weekends and major holidays. You have access to a nurse at all times for urgent questions. Please call the main number to the clinic Dept: [phone] and follow the prompts.   For any non-urgent questions, you may also contact your provider using MyChart. We now offer e-Visits for anyone 26 and older to request care online for non-urgent symptoms. For details visit [email or web address].   Also download the MyChart app! Go to the app store, search "MyChart", open the app, select [facility], and log in with your MyChart username and password.  Due to Covid, a mask is required upon entering the hospital/clinic. If you do not have a mask, one will be given to you upon arrival. For doctor visits, patients may have 1 support person aged 35 or older with them. For treatment visits, patients cannot have anyone with them due to current Covid guidelines and our immunocompromised population.   Nanoparticle Albumin-Bound Paclitaxel injection What is this medication? NANOPARTICLE ALBUMIN-BOUND PACLITAXEL (Na no PAHR ti kuhl al BYOO muhn-bound PAK li TAX el) is a chemotherapy drug. It targets fast dividing cells, like cancer cells, and causes these cells to die. This medicine  is used to treat advanced breast cancer, lung cancer, and pancreatic cancer. This medicine may be used for other purposes; ask your health care provider or pharmacist if you have questions. COMMON BRAND NAME(S): Abraxane What should I tell my care team before I take this  medication? They need to know if you have any of these conditions: kidney disease liver disease low blood counts, like low white cell, platelet, or red cell counts lung or breathing disease, like asthma tingling of the fingers or toes, or other nerve disorder an unusual or allergic reaction to paclitaxel, albumin, other chemotherapy, other medicines, foods, dyes, or preservatives pregnant or trying to get pregnant breast-feeding How should I use this medication? This drug is given as an infusion into a vein. It is administered in a hospital or clinic by a specially trained health care professional. Talk to your pediatrician regarding the use of this medicine in children. Special care may be needed. Overdosage: If you think you have taken too much of this medicine contact a poison control center or emergency room at once. NOTE: This medicine is only for you. Do not share this medicine with others. What if I miss a dose? It is important not to miss your dose. Call your doctor or health care professional if you are unable to keep an appointment. What may interact with this medication? This medicine may interact with the following medications: antiviral medicines for hepatitis, HIV or AIDS certain antibiotics like erythromycin and clarithromycin certain medicines for fungal infections like ketoconazole and itraconazole certain medicines for seizures like carbamazepine, phenobarbital, phenytoin gemfibrozil nefazodone rifampin St. John's wort This list may not describe all possible interactions. Give your health care provider a list of all the medicines, herbs, non-prescription drugs, or dietary supplements you use. Also tell them if you smoke, drink alcohol, or use illegal drugs. Some items may interact with your medicine. What should I watch for while using this medication? Your condition will be monitored carefully while you are receiving this medicine. You will need important blood work  done while you are taking this medicine. This medicine can cause serious allergic reactions. If you experience allergic reactions like skin rash, itching or hives, swelling of the face, lips, or tongue, tell your doctor or health care professional right away. In some cases, you may be given additional medicines to help with side effects. Follow all directions for their use. This drug may make you feel generally unwell. This is not uncommon, as chemotherapy can affect healthy cells as well as cancer cells. Report any side effects. Continue your course of treatment even though you feel ill unless your doctor tells you to stop. Call your doctor or health care professional for advice if you get a fever, chills or sore throat, or other symptoms of a cold or flu. Do not treat yourself. This drug decreases your body's ability to fight infections. Try to avoid being around people who are sick. This medicine may increase your risk to bruise or bleed. Call your doctor or health care professional if you notice any unusual bleeding. Be careful brushing and flossing your teeth or using a toothpick because you may get an infection or bleed more easily. If you have any dental work done, tell your dentist you are receiving this medicine. Avoid taking products that contain aspirin, acetaminophen, ibuprofen, naproxen, or ketoprofen unless instructed by your doctor. These medicines may hide a fever. Do not become pregnant while taking this medicine or for 6 months after stopping it.  Women should inform their doctor if they wish to become pregnant or think they might be pregnant. Men should not father a child while taking this medicine or for 3 months after stopping it. There is a potential for serious side effects to an unborn child. Talk to your health care professional or pharmacist for more information. Do not breast-feed an infant while taking this medicine or for 2 weeks after stopping it. This medicine may interfere  with the ability to get pregnant or to father a child. You should talk to your doctor or health care professional if you are concerned about your fertility. What side effects may I notice from receiving this medication? Side effects that you should report to your doctor or health care professional as soon as possible: allergic reactions like skin rash, itching or hives, swelling of the face, lips, or tongue breathing problems changes in vision fast, irregular heartbeat low blood pressure mouth sores pain, tingling, numbness in the hands or feet signs of decreased platelets or bleeding - bruising, pinpoint red spots on the skin, black, tarry stools, blood in the urine signs of decreased red blood cells - unusually weak or tired, feeling faint or lightheaded, falls signs of infection - fever or chills, cough, sore throat, pain or difficulty passing urine signs and symptoms of liver injury like dark yellow or brown urine; general ill feeling or flu-like symptoms; light-colored stools; loss of appetite; nausea; right upper belly pain; unusually weak or tired; yellowing of the eyes or skin swelling of the ankles, feet, hands unusually slow heartbeat Side effects that usually do not require medical attention (report to your doctor or health care professional if they continue or are bothersome): diarrhea hair loss loss of appetite nausea, vomiting tiredness This list may not describe all possible side effects. Call your doctor for medical advice about side effects. You may report side effects to FDA at 1-800-FDA-1088. Where should I keep my medication? This drug is given in a hospital or clinic and will not be stored at home. NOTE: This sheet is a summary. It may not cover all possible information. If you have questions about this medicine, talk to your doctor, pharmacist, or health care provider.  2022 Elsevier/Gold Standard (2017-08-04 13:03:45)  Gemcitabine injection What is this  medication? GEMCITABINE (jem SYE ta been) is a chemotherapy drug. This medicine is used to treat many types of cancer like breast cancer, lung cancer, pancreatic cancer, and ovarian cancer. This medicine may be used for other purposes; ask your health care provider or pharmacist if you have questions. COMMON BRAND NAME(S): Gemzar, Infugem What should I tell my care team before I take this medication? They need to know if you have any of these conditions: blood disorders infection kidney disease liver disease lung or breathing disease, like asthma recent or ongoing radiation therapy an unusual or allergic reaction to gemcitabine, other chemotherapy, other medicines, foods, dyes, or preservatives pregnant or trying to get pregnant breast-feeding How should I use this medication? This drug is given as an infusion into a vein. It is administered in a hospital or clinic by a specially trained health care professional. Talk to your pediatrician regarding the use of this medicine in children. Special care may be needed. Overdosage: If you think you have taken too much of this medicine contact a poison control center or emergency room at once. NOTE: This medicine is only for you. Do not share this medicine with others. What if I miss a dose? It is  important not to miss your dose. Call your doctor or health care professional if you are unable to keep an appointment. What may interact with this medication? medicines to increase blood counts like filgrastim, pegfilgrastim, sargramostim some other chemotherapy drugs like cisplatin vaccines Talk to your doctor or health care professional before taking any of these medicines: acetaminophen aspirin ibuprofen ketoprofen naproxen This list may not describe all possible interactions. Give your health care provider a list of all the medicines, herbs, non-prescription drugs, or dietary supplements you use. Also tell them if you smoke, drink alcohol, or  use illegal drugs. Some items may interact with your medicine. What should I watch for while using this medication? Visit your doctor for checks on your progress. This drug may make you feel generally unwell. This is not uncommon, as chemotherapy can affect healthy cells as well as cancer cells. Report any side effects. Continue your course of treatment even though you feel ill unless your doctor tells you to stop. In some cases, you may be given additional medicines to help with side effects. Follow all directions for their use. Call your doctor or health care professional for advice if you get a fever, chills or sore throat, or other symptoms of a cold or flu. Do not treat yourself. This drug decreases your body's ability to fight infections. Try to avoid being around people who are sick. This medicine may increase your risk to bruise or bleed. Call your doctor or health care professional if you notice any unusual bleeding. Be careful brushing and flossing your teeth or using a toothpick because you may get an infection or bleed more easily. If you have any dental work done, tell your dentist you are receiving this medicine. Avoid taking products that contain aspirin, acetaminophen, ibuprofen, naproxen, or ketoprofen unless instructed by your doctor. These medicines may hide a fever. Do not become pregnant while taking this medicine or for 6 months after stopping it. Women should inform their doctor if they wish to become pregnant or think they might be pregnant. Men should not father a child while taking this medicine and for 3 months after stopping it. There is a potential for serious side effects to an unborn child. Talk to your health care professional or pharmacist for more information. Do not breast-feed an infant while taking this medicine or for at least 1 week after stopping it. Men should inform their doctors if they wish to father a child. This medicine may lower sperm counts. Talk with your  doctor or health care professional if you are concerned about your fertility. What side effects may I notice from receiving this medication? Side effects that you should report to your doctor or health care professional as soon as possible: allergic reactions like skin rash, itching or hives, swelling of the face, lips, or tongue breathing problems pain, redness, or irritation at site where injected signs and symptoms of a dangerous change in heartbeat or heart rhythm like chest pain; dizziness; fast or irregular heartbeat; palpitations; feeling faint or lightheaded, falls; breathing problems signs of decreased platelets or bleeding - bruising, pinpoint red spots on the skin, black, tarry stools, blood in the urine signs of decreased red blood cells - unusually weak or tired, feeling faint or lightheaded, falls signs of infection - fever or chills, cough, sore throat, pain or difficulty passing urine signs and symptoms of kidney injury like trouble passing urine or change in the amount of urine signs and symptoms of liver injury like dark  yellow or brown urine; general ill feeling or flu-like symptoms; light-colored stools; loss of appetite; nausea; right upper belly pain; unusually weak or tired; yellowing of the eyes or skin swelling of ankles, feet, hands Side effects that usually do not require medical attention (report to your doctor or health care professional if they continue or are bothersome): constipation diarrhea hair loss loss of appetite nausea rash vomiting This list may not describe all possible side effects. Call your doctor for medical advice about side effects. You may report side effects to FDA at 1-800-FDA-1088. Where should I keep my medication? This drug is given in a hospital or clinic and will not be stored at home. NOTE: This sheet is a summary. It may not cover all possible information. If you have questions about this medicine, talk to your doctor, pharmacist, or  health care provider.  2022 Elsevier/Gold Standard (2018-02-24 18:06:11)

## 2021-09-05 ENCOUNTER — Other Ambulatory Visit: Payer: Self-pay | Admitting: *Deleted

## 2021-09-05 DIAGNOSIS — C250 Malignant neoplasm of head of pancreas: Secondary | ICD-10-CM

## 2021-09-06 ENCOUNTER — Inpatient Hospital Stay: Payer: Medicare Other

## 2021-09-06 ENCOUNTER — Other Ambulatory Visit: Payer: Self-pay

## 2021-09-06 VITALS — BP 145/79 | HR 87 | Temp 98.2°F | Resp 18

## 2021-09-06 DIAGNOSIS — K2960 Other gastritis without bleeding: Secondary | ICD-10-CM

## 2021-09-06 DIAGNOSIS — Z5111 Encounter for antineoplastic chemotherapy: Secondary | ICD-10-CM | POA: Diagnosis not present

## 2021-09-06 DIAGNOSIS — C251 Malignant neoplasm of body of pancreas: Secondary | ICD-10-CM

## 2021-09-06 DIAGNOSIS — D709 Neutropenia, unspecified: Secondary | ICD-10-CM | POA: Diagnosis not present

## 2021-09-06 MED ORDER — PEGFILGRASTIM-CBQV 6 MG/0.6ML ~~LOC~~ SOSY
6.0000 mg | PREFILLED_SYRINGE | Freq: Once | SUBCUTANEOUS | Status: AC
  Administered 2021-09-06: 6 mg via SUBCUTANEOUS

## 2021-09-06 NOTE — Patient Instructions (Signed)
[facility]  Discharge Instructions: Thank you for choosing [facility] to provide your oncology and hematology care.   If you have a lab appointment with the [facility], please go directly to the [facility] and check in at the registration area.   Wear comfortable clothing and clothing appropriate for easy access to any Portacath or PICC line.   We strive to give you quality time with your provider. You may need to reschedule your appointment if you arrive late (15 or more minutes).  Arriving late affects you and other patients whose appointments are after yours.  Also, if you miss three or more appointments without notifying the office, you may be dismissed from the clinic at the provider's discretion.      For prescription refill requests, have your pharmacy contact our office and allow 72 hours for refills to be completed.    Today you received the following chemotherapy and/or immunotherapy agents UDENCYNA       To help prevent nausea and vomiting after your treatment, we encourage you to take your nausea medication as directed.  BELOW ARE SYMPTOMS THAT SHOULD BE REPORTED IMMEDIATELY: *FEVER GREATER THAN 100.4 F (38 C) OR HIGHER *CHILLS OR SWEATING *NAUSEA AND VOMITING THAT IS NOT CONTROLLED WITH YOUR NAUSEA MEDICATION *UNUSUAL SHORTNESS OF BREATH *UNUSUAL BRUISING OR BLEEDING *URINARY PROBLEMS (pain or burning when urinating, or frequent urination) *BOWEL PROBLEMS (unusual diarrhea, constipation, pain near the anus) TENDERNESS IN MOUTH AND THROAT WITH OR WITHOUT PRESENCE OF ULCERS (sore throat, sores in mouth, or a toothache) UNUSUAL RASH, SWELLING OR PAIN  UNUSUAL VAGINAL DISCHARGE OR ITCHING   Items with * indicate a potential emergency and should be followed up as soon as possible or go to the Emergency Department if any problems should occur.  Please show the CHEMOTHERAPY ALERT CARD or IMMUNOTHERAPY ALERT CARD at check-in to the  Emergency Department and triage nurse.  Should you have questions after your visit or need to cancel or reschedule your appointment, please contact [facility]  Dept: [phone]  and follow the prompts.  Office hours are 8:00 a.m. to 4:30 p.m. Monday - Friday. Please note that voicemails left after 4:00 p.m. may not be returned until the following business day.  We are closed weekends and major holidays. You have access to a nurse at all times for urgent questions. Please call the main number to the clinic Dept: [phone] and follow the prompts.   For any non-urgent questions, you may also contact your provider using MyChart. We now offer e-Visits for anyone 48 and older to request care online for non-urgent symptoms. For details visit [email or web address].   Also download the MyChart app! Go to the app store, search "MyChart", open the app, select [facility], and log in with your MyChart username and password.  Due to Covid, a mask is required upon entering the hospital/clinic. If you do not have a mask, one will be given to you upon arrival. For doctor visits, patients may have 1 support person aged 72 or older with them. For treatment visits, patients cannot have anyone with them due to current Covid guidelines and our immunocompromised population.   Pegfilgrastim injection What is this medication? PEGFILGRASTIM (PEG fil gra stim) is a long-acting granulocyte colony-stimulating factor that stimulates the growth of neutrophils, a type of white blood cell important in the body's fight against infection. It is used to reduce the incidence of fever and infection in  patients with certain types of cancer who are receiving chemotherapy that affects the bone marrow, and to increase survival after being exposed to high doses of radiation. This medicine may be used for other purposes; ask your health care provider or pharmacist if you have questions. COMMON BRAND NAME(S):  Rexene Edison, Ziextenzo What should I tell my care team before I take this medication? They need to know if you have any of these conditions: kidney disease latex allergy ongoing radiation therapy sickle cell disease skin reactions to acrylic adhesives (On-Body Injector only) an unusual or allergic reaction to pegfilgrastim, filgrastim, other medicines, foods, dyes, or preservatives pregnant or trying to get pregnant breast-feeding How should I use this medication? This medicine is for injection under the skin. If you get this medicine at home, you will be taught how to prepare and give the pre-filled syringe or how to use the On-body Injector. Refer to the patient Instructions for Use for detailed instructions. Use exactly as directed. Tell your healthcare provider immediately if you suspect that the On-body Injector may not have performed as intended or if you suspect the use of the On-body Injector resulted in a missed or partial dose. It is important that you put your used needles and syringes in a special sharps container. Do not put them in a trash can. If you do not have a sharps container, call your pharmacist or healthcare provider to get one. Talk to your pediatrician regarding the use of this medicine in children. While this drug may be prescribed for selected conditions, precautions do apply. Overdosage: If you think you have taken too much of this medicine contact a poison control center or emergency room at once. NOTE: This medicine is only for you. Do not share this medicine with others. What if I miss a dose? It is important not to miss your dose. Call your doctor or health care professional if you miss your dose. If you miss a dose due to an On-body Injector failure or leakage, a new dose should be administered as soon as possible using a single prefilled syringe for manual use. What may interact with this medication? Interactions have not been  studied. This list may not describe all possible interactions. Give your health care provider a list of all the medicines, herbs, non-prescription drugs, or dietary supplements you use. Also tell them if you smoke, drink alcohol, or use illegal drugs. Some items may interact with your medicine. What should I watch for while using this medication? Your condition will be monitored carefully while you are receiving this medicine. You may need blood work done while you are taking this medicine. Talk to your health care provider about your risk of cancer. You may be more at risk for certain types of cancer if you take this medicine. If you are going to need a MRI, CT scan, or other procedure, tell your doctor that you are using this medicine (On-Body Injector only). What side effects may I notice from receiving this medication? Side effects that you should report to your doctor or health care professional as soon as possible: allergic reactions (skin rash, itching or hives, swelling of the face, lips, or tongue) back pain dizziness fever pain, redness, or irritation at site where injected pinpoint red spots on the skin red or dark-brown urine shortness of breath or breathing problems stomach or side pain, or pain at the shoulder swelling tiredness trouble passing urine or change in the amount of urine unusual bruising or  bleeding Side effects that usually do not require medical attention (report to your doctor or health care professional if they continue or are bothersome): bone pain muscle pain This list may not describe all possible side effects. Call your doctor for medical advice about side effects. You may report side effects to FDA at 1-800-FDA-1088. Where should I keep my medication? Keep out of the reach of children. If you are using this medicine at home, you will be instructed on how to store it. Throw away any unused medicine after the expiration date on the label. NOTE: This sheet  is a summary. It may not cover all possible information. If you have questions about this medicine, talk to your doctor, pharmacist, or health care provider.  2022 Elsevier/Gold Standard (2020-12-28 11:54:14)

## 2021-09-11 ENCOUNTER — Ambulatory Visit: Payer: Medicare Other | Admitting: Nurse Practitioner

## 2021-09-11 ENCOUNTER — Ambulatory Visit: Payer: Medicare Other

## 2021-09-11 ENCOUNTER — Other Ambulatory Visit: Payer: Medicare Other

## 2021-09-12 ENCOUNTER — Other Ambulatory Visit: Payer: Medicare Other

## 2021-09-12 ENCOUNTER — Ambulatory Visit: Payer: Medicare Other

## 2021-09-12 ENCOUNTER — Ambulatory Visit: Payer: Medicare Other | Admitting: Oncology

## 2021-09-15 ENCOUNTER — Other Ambulatory Visit: Payer: Self-pay | Admitting: Oncology

## 2021-09-18 ENCOUNTER — Inpatient Hospital Stay (HOSPITAL_BASED_OUTPATIENT_CLINIC_OR_DEPARTMENT_OTHER): Payer: Medicare Other | Admitting: Oncology

## 2021-09-18 ENCOUNTER — Inpatient Hospital Stay: Payer: Medicare Other | Attending: Nurse Practitioner

## 2021-09-18 ENCOUNTER — Other Ambulatory Visit: Payer: Self-pay | Admitting: Nurse Practitioner

## 2021-09-18 ENCOUNTER — Other Ambulatory Visit: Payer: Self-pay

## 2021-09-18 ENCOUNTER — Inpatient Hospital Stay: Payer: Medicare Other

## 2021-09-18 VITALS — BP 147/74 | HR 98 | Temp 98.7°F | Resp 18 | Ht 71.0 in | Wt 247.2 lb

## 2021-09-18 DIAGNOSIS — Z5189 Encounter for other specified aftercare: Secondary | ICD-10-CM | POA: Diagnosis not present

## 2021-09-18 DIAGNOSIS — Z5111 Encounter for antineoplastic chemotherapy: Secondary | ICD-10-CM | POA: Insufficient documentation

## 2021-09-18 DIAGNOSIS — K2960 Other gastritis without bleeding: Secondary | ICD-10-CM

## 2021-09-18 DIAGNOSIS — C251 Malignant neoplasm of body of pancreas: Secondary | ICD-10-CM | POA: Diagnosis not present

## 2021-09-18 DIAGNOSIS — C250 Malignant neoplasm of head of pancreas: Secondary | ICD-10-CM

## 2021-09-18 LAB — CBC WITH DIFFERENTIAL (CANCER CENTER ONLY)
Abs Immature Granulocytes: 0.09 10*3/uL — ABNORMAL HIGH (ref 0.00–0.07)
Basophils Absolute: 0 10*3/uL (ref 0.0–0.1)
Basophils Relative: 0 %
Eosinophils Absolute: 0.2 10*3/uL (ref 0.0–0.5)
Eosinophils Relative: 1 %
HCT: 34 % — ABNORMAL LOW (ref 39.0–52.0)
Hemoglobin: 10.8 g/dL — ABNORMAL LOW (ref 13.0–17.0)
Immature Granulocytes: 1 %
Lymphocytes Relative: 27 %
Lymphs Abs: 2.8 10*3/uL (ref 0.7–4.0)
MCH: 27.2 pg (ref 26.0–34.0)
MCHC: 31.8 g/dL (ref 30.0–36.0)
MCV: 85.6 fL (ref 80.0–100.0)
Monocytes Absolute: 1 10*3/uL (ref 0.1–1.0)
Monocytes Relative: 9 %
Neutro Abs: 6.5 10*3/uL (ref 1.7–7.7)
Neutrophils Relative %: 62 %
Platelet Count: 265 10*3/uL (ref 150–400)
RBC: 3.97 MIL/uL — ABNORMAL LOW (ref 4.22–5.81)
RDW: 20.1 % — ABNORMAL HIGH (ref 11.5–15.5)
WBC Count: 10.5 10*3/uL (ref 4.0–10.5)
nRBC: 1.8 % — ABNORMAL HIGH (ref 0.0–0.2)

## 2021-09-18 LAB — CMP (CANCER CENTER ONLY)
ALT: 17 U/L (ref 0–44)
AST: 23 U/L (ref 15–41)
Albumin: 3.5 g/dL (ref 3.5–5.0)
Alkaline Phosphatase: 110 U/L (ref 38–126)
Anion gap: 9 (ref 5–15)
BUN: 15 mg/dL (ref 8–23)
CO2: 26 mmol/L (ref 22–32)
Calcium: 9.2 mg/dL (ref 8.9–10.3)
Chloride: 103 mmol/L (ref 98–111)
Creatinine: 0.99 mg/dL (ref 0.61–1.24)
GFR, Estimated: >60 mL/min (ref >60)
Glucose, Bld: 201 mg/dL — ABNORMAL HIGH (ref 70–99)
Potassium: 3.6 mmol/L (ref 3.5–5.1)
Sodium: 138 mmol/L (ref 135–145)
Total Bilirubin: 0.4 mg/dL (ref 0.3–1.2)
Total Protein: 6.7 g/dL (ref 6.5–8.1)

## 2021-09-18 MED ORDER — POTASSIUM CHLORIDE CRYS ER 20 MEQ PO TBCR
20.0000 meq | EXTENDED_RELEASE_TABLET | Freq: Every day | ORAL | 0 refills | Status: DC
Start: 2021-09-18 — End: 2021-10-22

## 2021-09-18 MED ORDER — SODIUM CHLORIDE 0.9 % IV SOLN
1000.0000 mg/m2 | Freq: Once | INTRAVENOUS | Status: AC
  Administered 2021-09-18: 2394 mg via INTRAVENOUS
  Filled 2021-09-18: qty 52.6

## 2021-09-18 MED ORDER — DEXAMETHASONE SODIUM PHOSPHATE 10 MG/ML IJ SOLN
5.0000 mg | Freq: Once | INTRAMUSCULAR | Status: AC
  Administered 2021-09-18: 5 mg via INTRAVENOUS
  Filled 2021-09-18: qty 1

## 2021-09-18 MED ORDER — HEPARIN SOD (PORK) LOCK FLUSH 100 UNIT/ML IV SOLN
500.0000 [IU] | Freq: Once | INTRAVENOUS | Status: AC | PRN
  Administered 2021-09-18: 500 [IU]

## 2021-09-18 MED ORDER — ONDANSETRON HCL 8 MG PO TABS
8.0000 mg | ORAL_TABLET | Freq: Once | ORAL | Status: AC
  Administered 2021-09-18: 8 mg via ORAL
  Filled 2021-09-18: qty 1

## 2021-09-18 MED ORDER — SODIUM CHLORIDE 0.9% FLUSH
10.0000 mL | INTRAVENOUS | Status: DC | PRN
Start: 2021-09-18 — End: 2021-09-18
  Administered 2021-09-18: 10 mL

## 2021-09-18 MED ORDER — SODIUM CHLORIDE 0.9 % IV SOLN: Freq: Once | INTRAVENOUS | Status: AC

## 2021-09-18 MED ORDER — PACLITAXEL PROTEIN-BOUND CHEMO INJECTION 100 MG
100.0000 mg/m2 | Freq: Once | INTRAVENOUS | Status: AC
  Administered 2021-09-18: 250 mg via INTRAVENOUS
  Filled 2021-09-18: qty 50

## 2021-09-18 NOTE — Progress Notes (Signed)
[facility] OFFICE PROGRESS NOTE   Diagnosis: Pancreas cancer   INTERVAL HISTORY:   [name] completed another cycle of gemcitabine/Abraxane on 09/04/2021.  No fever, rash, or neuropathy symptoms.  He has noted erythema and drainage of the eyes for the past week.  Good appetite.  No bone pain following Neulasta.  Objective:  Vital signs in last 24 hours:  Blood pressure (!) 147/74, pulse 98, temperature 98.7 F (37.1 C), temperature source Oral, resp. rate 18, height 5\' 11"  (1.803 m), weight 247 lb 3.2 oz (112.1 kg), SpO2 98 %.    HEENT: No thrush or ulcers, mild left conjunctival erythema with an erythematous cystic lesion inferior and lateral to the left orbit Resp: Lungs clear bilaterally Cardio: Regular rate and rhythm GI: No hepatosplenomegaly Vascular: The right lower leg is slightly larger than the left side with chronic stasis change    Portacath/PICC-without erythema  Lab Results:  Lab Results  Component Value Date   WBC 10.5 09/18/2021   HGB 10.8 (L) 09/18/2021   HCT 34.0 (L) 09/18/2021   MCV 85.6 09/18/2021   PLT 265 09/18/2021   NEUTROABS 6.5 09/18/2021    CMP  Lab Results  Component Value Date   NA 138 09/04/2021   K 3.7 09/04/2021   CL 104 09/04/2021   CO2 26 09/04/2021   GLUCOSE 260 (H) 09/04/2021   BUN 16 09/04/2021   CREATININE 0.97 09/04/2021   CALCIUM 9.2 09/04/2021   PROT 6.7 09/04/2021   ALBUMIN 3.5 09/04/2021   AST 19 09/04/2021   ALT 15 09/04/2021   ALKPHOS 80 09/04/2021   BILITOT 0.3 09/04/2021   GFRNONAA >60 09/04/2021   GFRAA 49 (L) 10/10/2020     Medications: I have reviewed the patient's current medications.   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  No pancreatic ductal dilatation identified.  No definite signs of metastatic disease in the chest, abdomen or pelvis. Cycle 1 gemcitabine/Abraxane 03/01/2021 Cycle 2 gemcitabine/Abraxane 03/14/2021 Cycle 3 gemcitabine/Abraxane 03/28/2021 Cycle 4 gemcitabine/Abraxane 04/17/2021 Cycle 5 gemcitabine/Abraxane 05/01/2021, Zofran/Decadron added Cycle 6 gemcitabine/Abraxane 05/15/2021 CT pancreas protocol 05/27/2021-unchanged 8 mm exophytic low-attenuation lesion at the posterior body of the pancreas 07/01/2021-distal pancreatectomy/splenectomy, 0.8 cm poorly differentiated adenocarcinoma, treatment response score-2, largest single foci of remaining tumor 0.2 cm, negative resection margins, 0/6 lymph nodes,ypT1bypNo, PanIN-1b Cycle 7 gemcitabine/Abraxane 08/14/2021 Cycle 8 gemcitabine/Abraxane 09/04/2021 Cycle 9 gemcitabine/Abraxane 09/18/2021 Diabetes Coronary artery disease Prostate cancer 2013-treated with radiation at [facility] Hypertension Sleep apnea 7.  Coronary artery bypass surgery 2004 8.  Hospital admission 03/04/2021-syncope, A. Fib-started on Eliquis 9.  Mild thrombocytopenia following cycle 1 gemcitabine/Abraxane-resolved 10.  Anemia      Disposition: [name] continues to tolerate the chemotherapy well.  He will complete another treatment with gemcitabine/Abraxane today.  He will again receive G-CSF support.  He appears to have mild left-sided conjunctivitis versus a stye.  He also has an inflamed cyst at the left malar region.  He will follow-up with his primary provider if the eye symptoms  progress.  Bill department will return for an office visit  and chemotherapy in 2 weeks.  Betsy Coder, MD  09/18/2021  9:52 AM

## 2021-09-18 NOTE — Progress Notes (Signed)
Patient presents for treatment. RN assessment completed along with the following:  Labs/vitals reviewed - Yes, and within treatment parameters.   Weight within 10% of previous measurement - Yes Oncology Treatment Attestation completed for current therapy- Yes, on date 02/15/21 Informed consent completed and reflects current therapy/intent - Yes, on date 03/05/21             Provider progress note reviewed - Yes, today's provider note was reviewed. Treatment/Antibody/Supportive plan reviewed - Yes, and there are no adjustments needed for today's treatment. S&H and other orders reviewed - Yes, and there are no additional orders identified. Previous treatment date reviewed - Yes, and the appropriate amount of time has elapsed between treatments. Clinic Hand Off Received from - no handoff received   Patient to proceed with treatment.

## 2021-09-18 NOTE — Patient Instructions (Signed)
[facility]  Discharge Instructions: Thank you for choosing [facility] to provide your oncology and hematology care.   If you have a lab appointment with the Cancer Center, please go directly to the Cancer Center and check in at the registration area.   Wear comfortable clothing and clothing appropriate for easy access to any Portacath or PICC line.   We strive to give you quality time with your provider. You may need to reschedule your appointment if you arrive late (15 or more minutes).  Arriving late affects you and other patients whose appointments are after yours.  Also, if you miss three or more appointments without notifying the office, you may be dismissed from the clinic at the provider's discretion.      For prescription refill requests, have your pharmacy contact our office and allow 72 hours for refills to be completed.    Today you received the following chemotherapy and/or immunotherapy agents Abraxane, Gemzar      To help prevent nausea and vomiting after your treatment, we encourage you to take your nausea medication as directed.  BELOW ARE SYMPTOMS THAT SHOULD BE REPORTED IMMEDIATELY: *FEVER GREATER THAN 100.4 F (38 C) OR HIGHER *CHILLS OR SWEATING *NAUSEA AND VOMITING THAT IS NOT CONTROLLED WITH YOUR NAUSEA MEDICATION *UNUSUAL SHORTNESS OF BREATH *UNUSUAL BRUISING OR BLEEDING *URINARY PROBLEMS (pain or burning when urinating, or frequent urination) *BOWEL PROBLEMS (unusual diarrhea, constipation, pain near the anus) TENDERNESS IN MOUTH AND THROAT WITH OR WITHOUT PRESENCE OF ULCERS (sore throat, sores in mouth, or a toothache) UNUSUAL RASH, SWELLING OR PAIN  UNUSUAL VAGINAL DISCHARGE OR ITCHING   Items with * indicate a potential emergency and should be followed up as soon as possible or go to the Emergency Department if any problems should occur.  Please show the CHEMOTHERAPY ALERT CARD or IMMUNOTHERAPY ALERT CARD at check-in  to the Emergency Department and triage nurse.  Should you have questions after your visit or need to cancel or reschedule your appointment, please contact [facility]  Dept: [phone]  and follow the prompts.  Office hours are 8:00 a.m. to 4:30 p.m. Monday - Friday. Please note that voicemails left after 4:00 p.m. may not be returned until the following business day.  We are closed weekends and major holidays. You have access to a nurse at all times for urgent questions. Please call the main number to the clinic Dept: [phone] and follow the prompts.   For any non-urgent questions, you may also contact your provider using MyChart. We now offer e-Visits for anyone 18 and older to request care online for non-urgent symptoms. For details visit [email or web address].   Also download the MyChart app! Go to the app store, search "MyChart", open the app, select , and log in with your MyChart username and password.  Due to Covid, a mask is required upon entering the hospital/clinic. If you do not have a mask, one will be given to you upon arrival. For doctor visits, patients may have 1 support person aged 18 or older with them. For treatment visits, patients cannot have anyone with them due to current Covid guidelines and our immunocompromised population.   Nanoparticle Albumin-Bound Paclitaxel injection What is this medication? NANOPARTICLE ALBUMIN-BOUND PACLITAXEL (Na no PAHR ti kuhl al BYOO muhn-bound PAK li TAX el) is a chemotherapy drug. It targets fast dividing cells, like cancer cells, and causes these cells to die. This medicine is used to treat advanced   breast cancer, lung cancer, and pancreatic cancer. This medicine may be used for other purposes; ask your health care provider or pharmacist if you have questions. COMMON BRAND NAME(S): Abraxane What should I tell my care team before I take this medication? They need to know if you have any of these  conditions: kidney disease liver disease low blood counts, like low white cell, platelet, or red cell counts lung or breathing disease, like asthma tingling of the fingers or toes, or other nerve disorder an unusual or allergic reaction to paclitaxel, albumin, other chemotherapy, other medicines, foods, dyes, or preservatives pregnant or trying to get pregnant breast-feeding How should I use this medication? This drug is given as an infusion into a vein. It is administered in a hospital or clinic by a specially trained health care professional. Talk to your pediatrician regarding the use of this medicine in children. Special care may be needed. Overdosage: If you think you have taken too much of this medicine contact a poison control center or emergency room at once. NOTE: This medicine is only for you. Do not share this medicine with others. What if I miss a dose? It is important not to miss your dose. Call your doctor or health care professional if you are unable to keep an appointment. What may interact with this medication? This medicine may interact with the following medications: antiviral medicines for hepatitis, HIV or AIDS certain antibiotics like erythromycin and clarithromycin certain medicines for fungal infections like ketoconazole and itraconazole certain medicines for seizures like carbamazepine, phenobarbital, phenytoin gemfibrozil nefazodone rifampin St. John's wort This list may not describe all possible interactions. Give your health care provider a list of all the medicines, herbs, non-prescription drugs, or dietary supplements you use. Also tell them if you smoke, drink alcohol, or use illegal drugs. Some items may interact with your medicine. What should I watch for while using this medication? Your condition will be monitored carefully while you are receiving this medicine. You will need important blood work done while you are taking this medicine. This medicine  can cause serious allergic reactions. If you experience allergic reactions like skin rash, itching or hives, swelling of the face, lips, or tongue, tell your doctor or health care professional right away. In some cases, you may be given additional medicines to help with side effects. Follow all directions for their use. This drug may make you feel generally unwell. This is not uncommon, as chemotherapy can affect healthy cells as well as cancer cells. Report any side effects. Continue your course of treatment even though you feel ill unless your doctor tells you to stop. Call your doctor or health care professional for advice if you get a fever, chills or sore throat, or other symptoms of a cold or flu. Do not treat yourself. This drug decreases your body's ability to fight infections. Try to avoid being around people who are sick. This medicine may increase your risk to bruise or bleed. Call your doctor or health care professional if you notice any unusual bleeding. Be careful brushing and flossing your teeth or using a toothpick because you may get an infection or bleed more easily. If you have any dental work done, tell your dentist you are receiving this medicine. Avoid taking products that contain aspirin, acetaminophen, ibuprofen, naproxen, or ketoprofen unless instructed by your doctor. These medicines may hide a fever. Do not become pregnant while taking this medicine or for 6 months after stopping it. Women should inform their doctor   if they wish to become pregnant or think they might be pregnant. Men should not father a child while taking this medicine or for 3 months after stopping it. There is a potential for serious side effects to an unborn child. Talk to your health care professional or pharmacist for more information. Do not breast-feed an infant while taking this medicine or for 2 weeks after stopping it. This medicine may interfere with the ability to get pregnant or to father a child. You  should talk to your doctor or health care professional if you are concerned about your fertility. What side effects may I notice from receiving this medication? Side effects that you should report to your doctor or health care professional as soon as possible: allergic reactions like skin rash, itching or hives, swelling of the face, lips, or tongue breathing problems changes in vision fast, irregular heartbeat low blood pressure mouth sores pain, tingling, numbness in the hands or feet signs of decreased platelets or bleeding - bruising, pinpoint red spots on the skin, black, tarry stools, blood in the urine signs of decreased red blood cells - unusually weak or tired, feeling faint or lightheaded, falls signs of infection - fever or chills, cough, sore throat, pain or difficulty passing urine signs and symptoms of liver injury like dark yellow or brown urine; general ill feeling or flu-like symptoms; light-colored stools; loss of appetite; nausea; right upper belly pain; unusually weak or tired; yellowing of the eyes or skin swelling of the ankles, feet, hands unusually slow heartbeat Side effects that usually do not require medical attention (report to your doctor or health care professional if they continue or are bothersome): diarrhea hair loss loss of appetite nausea, vomiting tiredness This list may not describe all possible side effects. Call your doctor for medical advice about side effects. You may report side effects to FDA at 1-800-FDA-1088. Where should I keep my medication? This drug is given in a hospital or clinic and will not be stored at home. NOTE: This sheet is a summary. It may not cover all possible information. If you have questions about this medicine, talk to your doctor, pharmacist, or health care provider.  2022 Elsevier/Gold Standard (2017-08-04 13:03:45)  Gemcitabine injection What is this medication? GEMCITABINE (jem SYE ta been) is a chemotherapy drug.  This medicine is used to treat many types of cancer like breast cancer, lung cancer, pancreatic cancer, and ovarian cancer. This medicine may be used for other purposes; ask your health care provider or pharmacist if you have questions. COMMON BRAND NAME(S): Gemzar, Infugem What should I tell my care team before I take this medication? They need to know if you have any of these conditions: blood disorders infection kidney disease liver disease lung or breathing disease, like asthma recent or ongoing radiation therapy an unusual or allergic reaction to gemcitabine, other chemotherapy, other medicines, foods, dyes, or preservatives pregnant or trying to get pregnant breast-feeding How should I use this medication? This drug is given as an infusion into a vein. It is administered in a hospital or clinic by a specially trained health care professional. Talk to your pediatrician regarding the use of this medicine in children. Special care may be needed. Overdosage: If you think you have taken too much of this medicine contact a poison control center or emergency room at once. NOTE: This medicine is only for you. Do not share this medicine with others. What if I miss a dose? It is important not to miss your   dose. Call your doctor or health care professional if you are unable to keep an appointment. What may interact with this medication? medicines to increase blood counts like filgrastim, pegfilgrastim, sargramostim some other chemotherapy drugs like cisplatin vaccines Talk to your doctor or health care professional before taking any of these medicines: acetaminophen aspirin ibuprofen ketoprofen naproxen This list may not describe all possible interactions. Give your health care provider a list of all the medicines, herbs, non-prescription drugs, or dietary supplements you use. Also tell them if you smoke, drink alcohol, or use illegal drugs. Some items may interact with your medicine. What  should I watch for while using this medication? Visit your doctor for checks on your progress. This drug may make you feel generally unwell. This is not uncommon, as chemotherapy can affect healthy cells as well as cancer cells. Report any side effects. Continue your course of treatment even though you feel ill unless your doctor tells you to stop. In some cases, you may be given additional medicines to help with side effects. Follow all directions for their use. Call your doctor or health care professional for advice if you get a fever, chills or sore throat, or other symptoms of a cold or flu. Do not treat yourself. This drug decreases your body's ability to fight infections. Try to avoid being around people who are sick. This medicine may increase your risk to bruise or bleed. Call your doctor or health care professional if you notice any unusual bleeding. Be careful brushing and flossing your teeth or using a toothpick because you may get an infection or bleed more easily. If you have any dental work done, tell your dentist you are receiving this medicine. Avoid taking products that contain aspirin, acetaminophen, ibuprofen, naproxen, or ketoprofen unless instructed by your doctor. These medicines may hide a fever. Do not become pregnant while taking this medicine or for 6 months after stopping it. Women should inform their doctor if they wish to become pregnant or think they might be pregnant. Men should not father a child while taking this medicine and for 3 months after stopping it. There is a potential for serious side effects to an unborn child. Talk to your health care professional or pharmacist for more information. Do not breast-feed an infant while taking this medicine or for at least 1 week after stopping it. Men should inform their doctors if they wish to father a child. This medicine may lower sperm counts. Talk with your doctor or health care professional if you are concerned about your  fertility. What side effects may I notice from receiving this medication? Side effects that you should report to your doctor or health care professional as soon as possible: allergic reactions like skin rash, itching or hives, swelling of the face, lips, or tongue breathing problems pain, redness, or irritation at site where injected signs and symptoms of a dangerous change in heartbeat or heart rhythm like chest pain; dizziness; fast or irregular heartbeat; palpitations; feeling faint or lightheaded, falls; breathing problems signs of decreased platelets or bleeding - bruising, pinpoint red spots on the skin, black, tarry stools, blood in the urine signs of decreased red blood cells - unusually weak or tired, feeling faint or lightheaded, falls signs of infection - fever or chills, cough, sore throat, pain or difficulty passing urine signs and symptoms of kidney injury like trouble passing urine or change in the amount of urine signs and symptoms of liver injury like dark yellow or brown urine; general   ill feeling or flu-like symptoms; light-colored stools; loss of appetite; nausea; right upper belly pain; unusually weak or tired; yellowing of the eyes or skin swelling of ankles, feet, hands Side effects that usually do not require medical attention (report to your doctor or health care professional if they continue or are bothersome): constipation diarrhea hair loss loss of appetite nausea rash vomiting This list may not describe all possible side effects. Call your doctor for medical advice about side effects. You may report side effects to FDA at 1-800-FDA-1088. Where should I keep my medication? This drug is given in a hospital or clinic and will not be stored at home. NOTE: This sheet is a summary. It may not cover all possible information. If you have questions about this medicine, talk to your doctor, pharmacist, or health care provider.  2022 Elsevier/Gold Standard (2018-02-24  18:06:11)  

## 2021-09-22 ENCOUNTER — Encounter (HOSPITAL_BASED_OUTPATIENT_CLINIC_OR_DEPARTMENT_OTHER): Payer: Self-pay | Admitting: Emergency Medicine

## 2021-09-22 ENCOUNTER — Inpatient Hospital Stay (HOSPITAL_BASED_OUTPATIENT_CLINIC_OR_DEPARTMENT_OTHER)
Admission: EM | Admit: 2021-09-22 | Discharge: 2021-09-27 | DRG: 638 | Disposition: A | Discharge status: Home / Self Care | Payer: Medicare Other | Attending: Internal Medicine | Admitting: Internal Medicine

## 2021-09-22 ENCOUNTER — Emergency Department (HOSPITAL_BASED_OUTPATIENT_CLINIC_OR_DEPARTMENT_OTHER): Payer: Medicare Other

## 2021-09-22 ENCOUNTER — Other Ambulatory Visit: Payer: Self-pay

## 2021-09-22 DIAGNOSIS — E1122 Type 2 diabetes mellitus with diabetic chronic kidney disease: Secondary | ICD-10-CM | POA: Diagnosis present

## 2021-09-22 DIAGNOSIS — L97411 Non-pressure chronic ulcer of right heel and midfoot limited to breakdown of skin: Secondary | ICD-10-CM | POA: Diagnosis present

## 2021-09-22 DIAGNOSIS — C259 Malignant neoplasm of pancreas, unspecified: Secondary | ICD-10-CM | POA: Diagnosis not present

## 2021-09-22 DIAGNOSIS — E11649 Type 2 diabetes mellitus with hypoglycemia without coma: Secondary | ICD-10-CM | POA: Diagnosis present

## 2021-09-22 DIAGNOSIS — E11621 Type 2 diabetes mellitus with foot ulcer: Secondary | ICD-10-CM | POA: Diagnosis present

## 2021-09-22 DIAGNOSIS — I4811 Longstanding persistent atrial fibrillation: Secondary | ICD-10-CM

## 2021-09-22 DIAGNOSIS — L97519 Non-pressure chronic ulcer of other part of right foot with unspecified severity: Secondary | ICD-10-CM | POA: Diagnosis present

## 2021-09-22 DIAGNOSIS — Z811 Family history of alcohol abuse and dependence: Secondary | ICD-10-CM

## 2021-09-22 DIAGNOSIS — I25708 Atherosclerosis of coronary artery bypass graft(s), unspecified, with other forms of angina pectoris: Secondary | ICD-10-CM | POA: Diagnosis present

## 2021-09-22 DIAGNOSIS — D638 Anemia in other chronic diseases classified elsewhere: Secondary | ICD-10-CM | POA: Diagnosis present

## 2021-09-22 DIAGNOSIS — L03115 Cellulitis of right lower limb: Secondary | ICD-10-CM | POA: Diagnosis not present

## 2021-09-22 DIAGNOSIS — Z79899 Other long term (current) drug therapy: Secondary | ICD-10-CM

## 2021-09-22 DIAGNOSIS — Z87891 Personal history of nicotine dependence: Secondary | ICD-10-CM

## 2021-09-22 DIAGNOSIS — E11628 Type 2 diabetes mellitus with other skin complications: Principal | ICD-10-CM | POA: Diagnosis present

## 2021-09-22 DIAGNOSIS — Z8546 Personal history of malignant neoplasm of prostate: Secondary | ICD-10-CM

## 2021-09-22 DIAGNOSIS — I2510 Atherosclerotic heart disease of native coronary artery without angina pectoris: Secondary | ICD-10-CM | POA: Diagnosis present

## 2021-09-22 DIAGNOSIS — Z9081 Acquired absence of spleen: Secondary | ICD-10-CM

## 2021-09-22 DIAGNOSIS — E1165 Type 2 diabetes mellitus with hyperglycemia: Secondary | ICD-10-CM | POA: Diagnosis not present

## 2021-09-22 DIAGNOSIS — I119 Hypertensive heart disease without heart failure: Secondary | ICD-10-CM | POA: Diagnosis present

## 2021-09-22 DIAGNOSIS — M7989 Other specified soft tissue disorders: Secondary | ICD-10-CM | POA: Diagnosis not present

## 2021-09-22 DIAGNOSIS — F32A Depression, unspecified: Secondary | ICD-10-CM | POA: Diagnosis present

## 2021-09-22 DIAGNOSIS — S91301A Unspecified open wound, right foot, initial encounter: Secondary | ICD-10-CM | POA: Diagnosis not present

## 2021-09-22 DIAGNOSIS — Z794 Long term (current) use of insulin: Secondary | ICD-10-CM | POA: Diagnosis not present

## 2021-09-22 DIAGNOSIS — Z8261 Family history of arthritis: Secondary | ICD-10-CM

## 2021-09-22 DIAGNOSIS — Z803 Family history of malignant neoplasm of breast: Secondary | ICD-10-CM

## 2021-09-22 DIAGNOSIS — K760 Fatty (change of) liver, not elsewhere classified: Secondary | ICD-10-CM | POA: Diagnosis present

## 2021-09-22 DIAGNOSIS — Z8249 Family history of ischemic heart disease and other diseases of the circulatory system: Secondary | ICD-10-CM

## 2021-09-22 DIAGNOSIS — N183 Chronic kidney disease, stage 3 unspecified: Secondary | ICD-10-CM | POA: Diagnosis present

## 2021-09-22 DIAGNOSIS — Z8507 Personal history of malignant neoplasm of pancreas: Secondary | ICD-10-CM

## 2021-09-22 DIAGNOSIS — Z23 Encounter for immunization: Secondary | ICD-10-CM | POA: Diagnosis not present

## 2021-09-22 DIAGNOSIS — N1831 Chronic kidney disease, stage 3a: Secondary | ICD-10-CM | POA: Diagnosis not present

## 2021-09-22 DIAGNOSIS — E1151 Type 2 diabetes mellitus with diabetic peripheral angiopathy without gangrene: Secondary | ICD-10-CM | POA: Diagnosis present

## 2021-09-22 DIAGNOSIS — N400 Enlarged prostate without lower urinary tract symptoms: Secondary | ICD-10-CM | POA: Diagnosis present

## 2021-09-22 DIAGNOSIS — E785 Hyperlipidemia, unspecified: Secondary | ICD-10-CM | POA: Diagnosis present

## 2021-09-22 DIAGNOSIS — N182 Chronic kidney disease, stage 2 (mild): Secondary | ICD-10-CM | POA: Diagnosis present

## 2021-09-22 DIAGNOSIS — D696 Thrombocytopenia, unspecified: Secondary | ICD-10-CM | POA: Diagnosis present

## 2021-09-22 DIAGNOSIS — F4310 Post-traumatic stress disorder, unspecified: Secondary | ICD-10-CM | POA: Diagnosis present

## 2021-09-22 DIAGNOSIS — Z951 Presence of aortocoronary bypass graft: Secondary | ICD-10-CM

## 2021-09-22 DIAGNOSIS — Z83438 Family history of other disorder of lipoprotein metabolism and other lipidemia: Secondary | ICD-10-CM

## 2021-09-22 DIAGNOSIS — I4891 Unspecified atrial fibrillation: Secondary | ICD-10-CM | POA: Diagnosis present

## 2021-09-22 DIAGNOSIS — I252 Old myocardial infarction: Secondary | ICD-10-CM

## 2021-09-22 DIAGNOSIS — I482 Chronic atrial fibrillation, unspecified: Secondary | ICD-10-CM | POA: Diagnosis present

## 2021-09-22 DIAGNOSIS — Z881 Allergy status to other antibiotic agents status: Secondary | ICD-10-CM

## 2021-09-22 DIAGNOSIS — Z888 Allergy status to other drugs, medicaments and biological substances status: Secondary | ICD-10-CM

## 2021-09-22 DIAGNOSIS — Z20822 Contact with and (suspected) exposure to covid-19: Secondary | ICD-10-CM | POA: Diagnosis present

## 2021-09-22 DIAGNOSIS — Z825 Family history of asthma and other chronic lower respiratory diseases: Secondary | ICD-10-CM

## 2021-09-22 DIAGNOSIS — Z818 Family history of other mental and behavioral disorders: Secondary | ICD-10-CM

## 2021-09-22 DIAGNOSIS — I129 Hypertensive chronic kidney disease with stage 1 through stage 4 chronic kidney disease, or unspecified chronic kidney disease: Secondary | ICD-10-CM | POA: Diagnosis present

## 2021-09-22 DIAGNOSIS — L0390 Cellulitis, unspecified: Secondary | ICD-10-CM | POA: Diagnosis present

## 2021-09-22 DIAGNOSIS — E1142 Type 2 diabetes mellitus with diabetic polyneuropathy: Secondary | ICD-10-CM | POA: Diagnosis present

## 2021-09-22 DIAGNOSIS — Z7901 Long term (current) use of anticoagulants: Secondary | ICD-10-CM

## 2021-09-22 DIAGNOSIS — E1129 Type 2 diabetes mellitus with other diabetic kidney complication: Secondary | ICD-10-CM | POA: Diagnosis present

## 2021-09-22 DIAGNOSIS — K219 Gastro-esophageal reflux disease without esophagitis: Secondary | ICD-10-CM | POA: Diagnosis present

## 2021-09-22 LAB — SEDIMENTATION RATE: Sed Rate: 35 mm/hr — ABNORMAL HIGH (ref 0–16)

## 2021-09-22 LAB — COMPREHENSIVE METABOLIC PANEL
ALT: 32 U/L (ref 0–44)
AST: 42 U/L — ABNORMAL HIGH (ref 15–41)
Albumin: 3 g/dL — ABNORMAL LOW (ref 3.5–5.0)
Alkaline Phosphatase: 71 U/L (ref 38–126)
Anion gap: 8 (ref 5–15)
BUN: 19 mg/dL (ref 8–23)
CO2: 23 mmol/L (ref 22–32)
Calcium: 8.1 mg/dL — ABNORMAL LOW (ref 8.9–10.3)
Chloride: 104 mmol/L (ref 98–111)
Creatinine, Ser: 0.82 mg/dL (ref 0.61–1.24)
GFR, Estimated: >60 mL/min (ref >60)
Glucose, Bld: 153 mg/dL — ABNORMAL HIGH (ref 70–99)
Potassium: 3.8 mmol/L (ref 3.5–5.1)
Sodium: 135 mmol/L (ref 135–145)
Total Bilirubin: 0.6 mg/dL (ref 0.3–1.2)
Total Protein: 5.8 g/dL — ABNORMAL LOW (ref 6.5–8.1)

## 2021-09-22 LAB — CBC WITH DIFFERENTIAL/PLATELET
Abs Immature Granulocytes: 0.04 10*3/uL (ref 0.00–0.07)
Basophils Absolute: 0 10*3/uL (ref 0.0–0.1)
Basophils Relative: 0 %
Eosinophils Absolute: 0 10*3/uL (ref 0.0–0.5)
Eosinophils Relative: 0 %
HCT: 31 % — ABNORMAL LOW (ref 39.0–52.0)
Hemoglobin: 9.8 g/dL — ABNORMAL LOW (ref 13.0–17.0)
Immature Granulocytes: 1 %
Lymphocytes Relative: 12 %
Lymphs Abs: 0.9 10*3/uL (ref 0.7–4.0)
MCH: 26.8 pg (ref 26.0–34.0)
MCHC: 31.6 g/dL (ref 30.0–36.0)
MCV: 84.9 fL (ref 80.0–100.0)
Monocytes Absolute: 0.1 10*3/uL (ref 0.1–1.0)
Monocytes Relative: 2 %
Neutro Abs: 6.3 10*3/uL (ref 1.7–7.7)
Neutrophils Relative %: 85 %
Platelets: 216 10*3/uL (ref 150–400)
RBC: 3.65 MIL/uL — ABNORMAL LOW (ref 4.22–5.81)
RDW: 20.8 % — ABNORMAL HIGH (ref 11.5–15.5)
WBC: 7.4 10*3/uL (ref 4.0–10.5)
nRBC: 0 % (ref 0.0–0.2)

## 2021-09-22 LAB — LACTIC ACID, PLASMA
Lactic Acid, Venous: 1.1 mmol/L (ref 0.5–1.9)
Lactic Acid, Venous: 1.2 mmol/L (ref 0.5–1.9)

## 2021-09-22 LAB — RESP PANEL BY RT-PCR (FLU A&B, COVID) ARPGX2
Influenza A by PCR: NEGATIVE
Influenza B by PCR: NEGATIVE
SARS Coronavirus 2 by RT PCR: NEGATIVE

## 2021-09-22 LAB — C-REACTIVE PROTEIN: CRP: 11.8 mg/dL — ABNORMAL HIGH (ref <1.0)

## 2021-09-22 LAB — APTT: aPTT: >200 seconds (ref 24–36)

## 2021-09-22 LAB — PROTIME-INR
INR: 1.8 — ABNORMAL HIGH (ref 0.8–1.2)
Prothrombin Time: 21 seconds — ABNORMAL HIGH (ref 11.4–15.2)

## 2021-09-22 IMAGING — DX DG CHEST 1V PORT
1 series · 1 of 1 positions shown · non-contrast
Comparison: [DATE]

CLINICAL DATA: Questionable sepsis.

EXAM:
PORTABLE CHEST 1 VIEW

[chest]
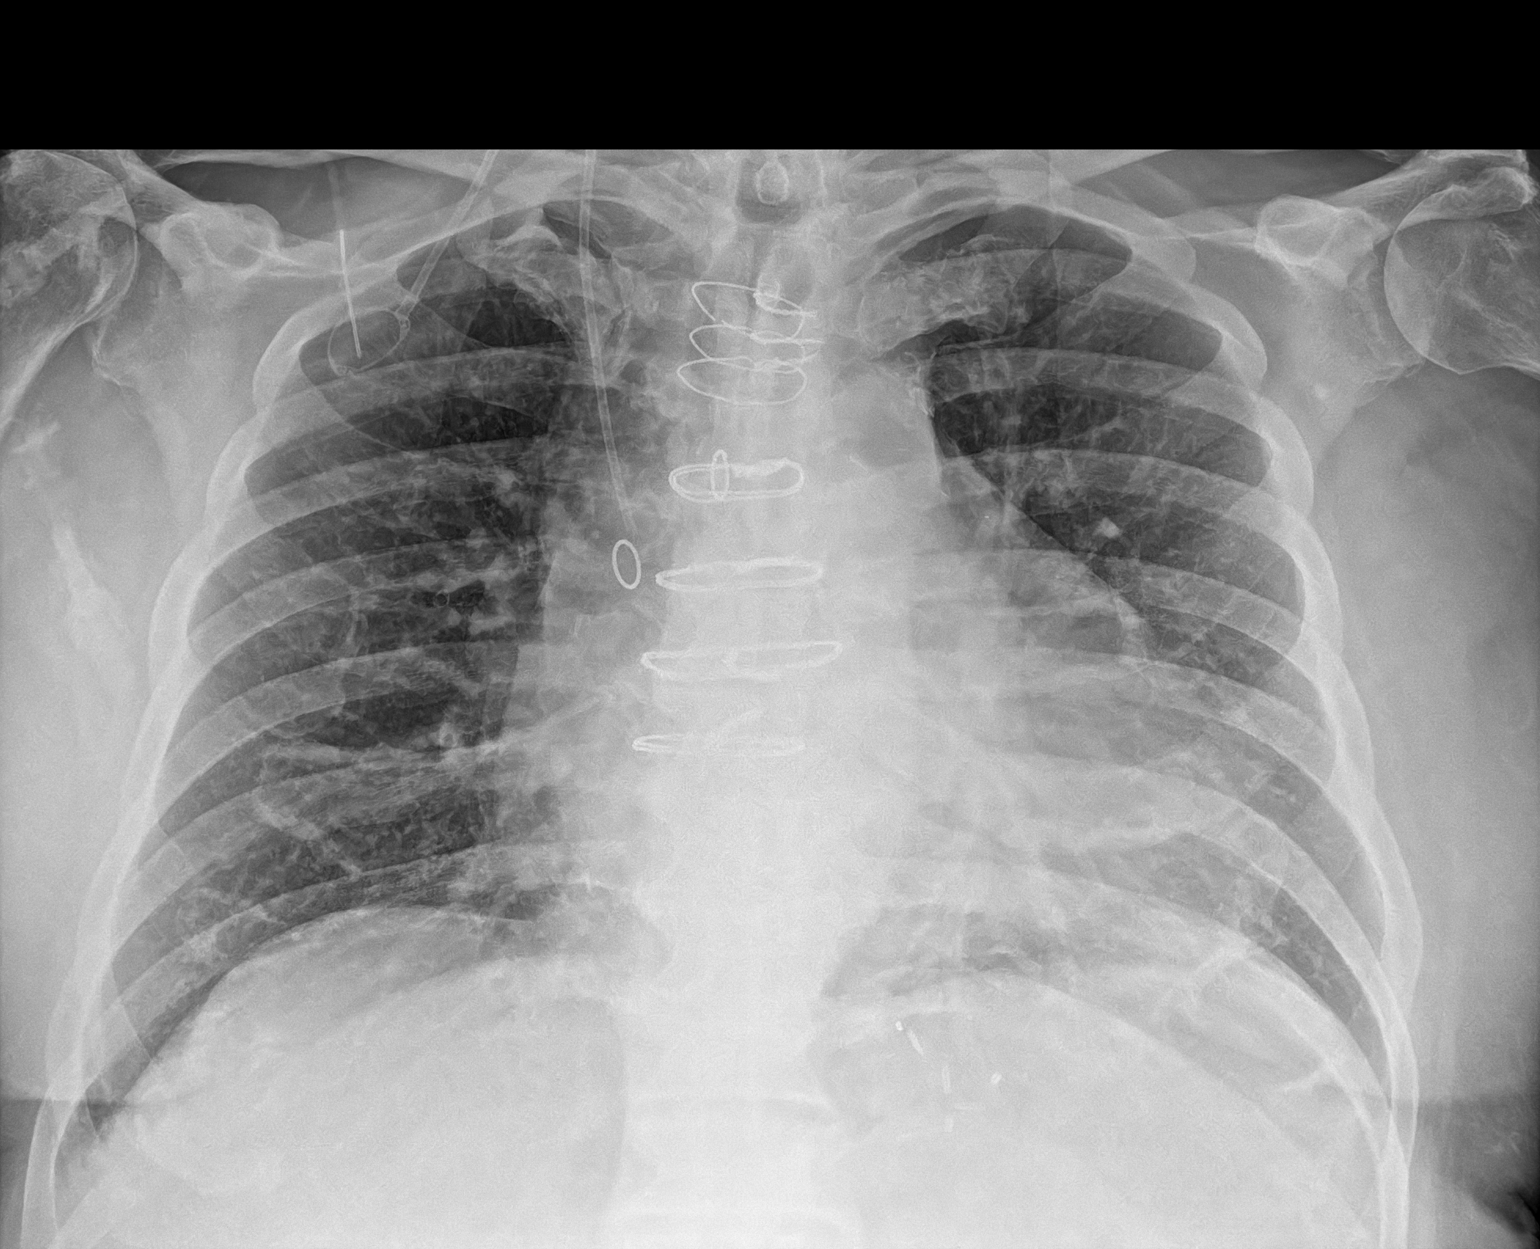

[1 of 1 positions shown; findings below may reference images not displayed]

FINDINGS: The heart size and mediastinal contours are stable. Heart size is
enlarged. Right central venous line is identified distal tip in the
superior vena cava unchanged. Both lungs are clear. The visualized
skeletal structures are stable.
IMPRESSION: No active cardiopulmonary disease identified.

## 2021-09-22 IMAGING — DX DG FOOT COMPLETE 3+V*R*
2 series · 3 of 3 positions shown · non-contrast
Comparison: None.

CLINICAL DATA: Diabetic foot ulcer of the distal fifth digit.

EXAM:
RIGHT FOOT COMPLETE - 3+ VIEW

[Series 1: foot · 0.14mm/px · 2 of 2 slices shown]
[im 1/2]
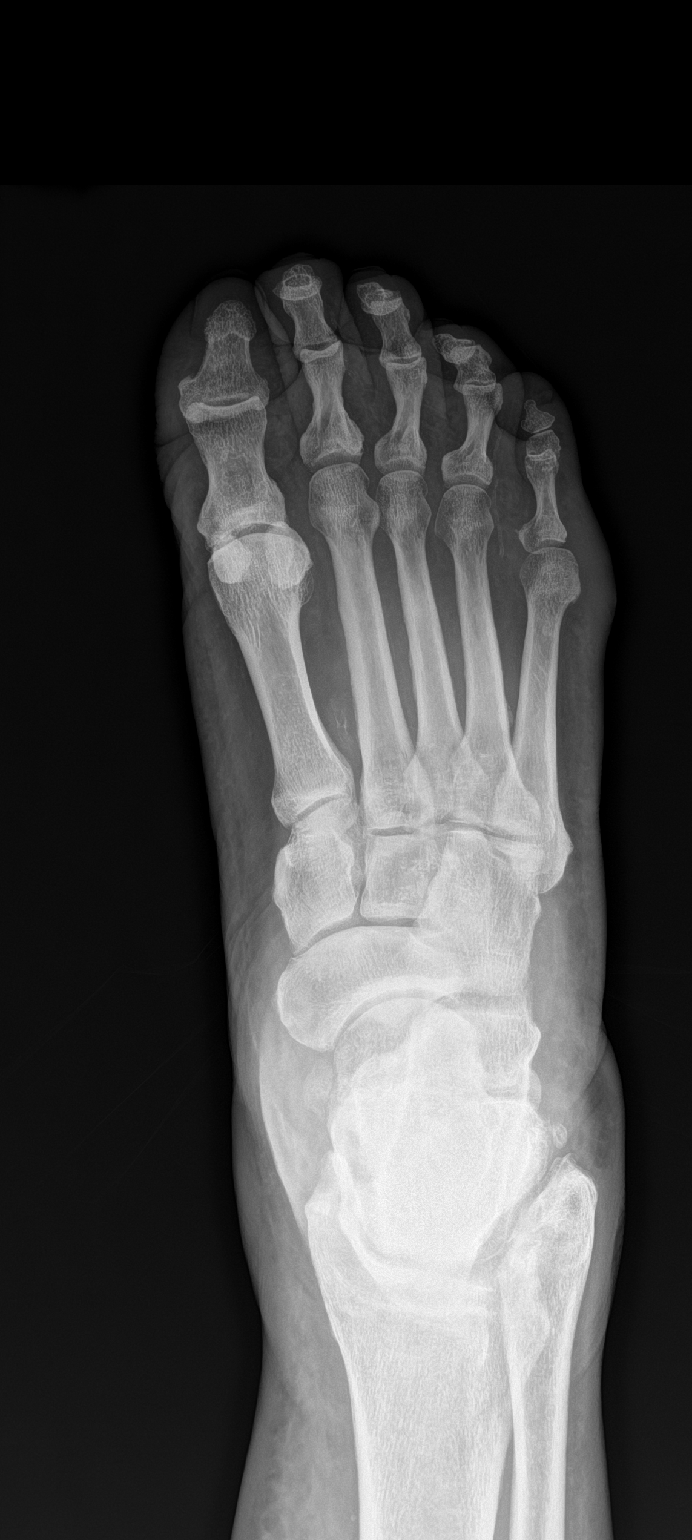
[im 2/2]
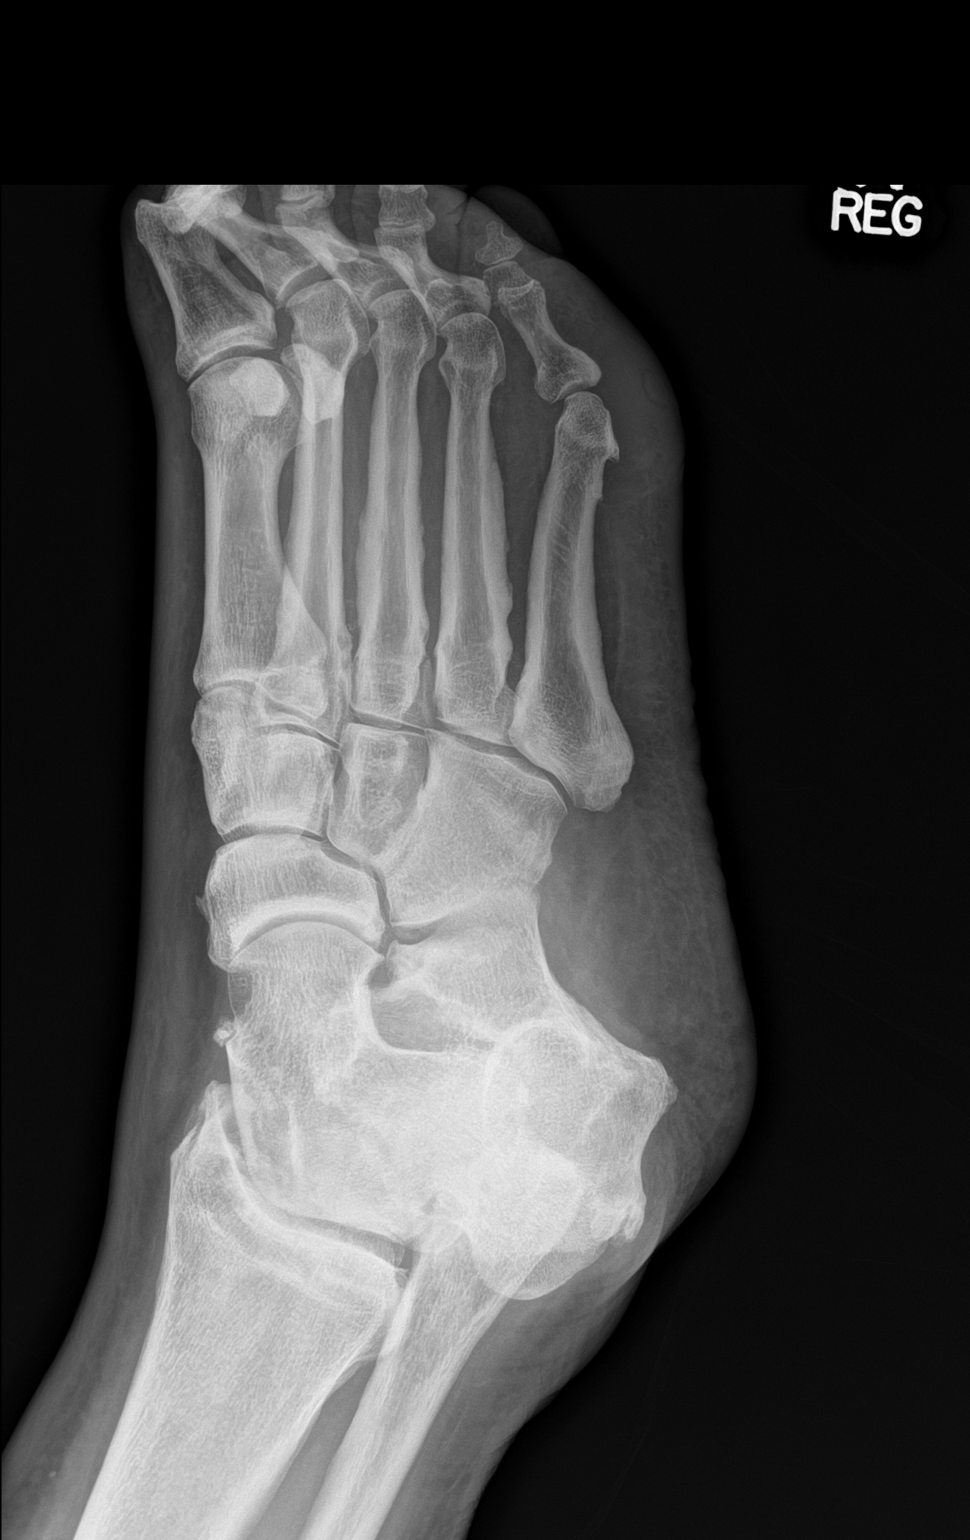

[leg]
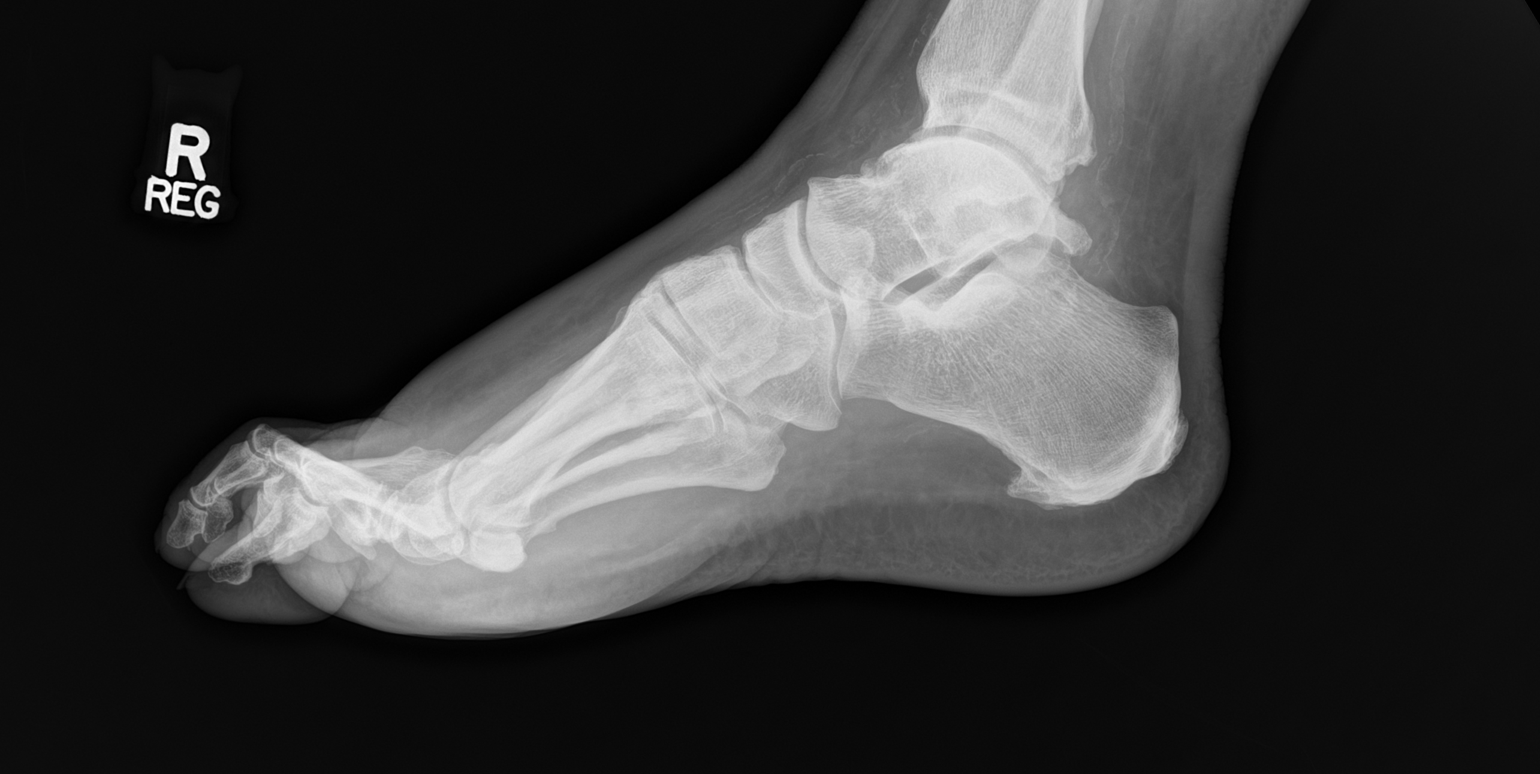

[3 of 3 positions shown; findings below may reference images not displayed]

FINDINGS: There is no evidence of fracture or dislocation. No bony destruction
is identified. There is soft tissue swelling of the right fifth
digit near the proximal interphalangeal joint.
IMPRESSION: No evidence of osteomyelitis. Soft tissue swelling of the right
fifth digit.

## 2021-09-22 MED ORDER — SODIUM CHLORIDE 0.9 % IV SOLN
2.0000 g | Freq: Three times a day (TID) | INTRAVENOUS | Status: DC
  Administered 2021-09-22 – 2021-09-27 (×15): 2 g via INTRAVENOUS
  Filled 2021-09-22 (×16): qty 2

## 2021-09-22 MED ORDER — INSULIN ASPART 100 UNIT/ML IJ SOLN: 0.0000 [IU] | Freq: Every day | INTRAMUSCULAR | Status: DC

## 2021-09-22 MED ORDER — VANCOMYCIN HCL IN DEXTROSE 1-5 GM/200ML-% IV SOLN
1000.0000 mg | Freq: Two times a day (BID) | INTRAVENOUS | Status: DC
  Administered 2021-09-23 – 2021-09-27 (×10): 1000 mg via INTRAVENOUS
  Filled 2021-09-22 (×10): qty 200

## 2021-09-22 MED ORDER — APIXABAN 5 MG PO TABS
5.0000 mg | ORAL_TABLET | Freq: Two times a day (BID) | ORAL | Status: DC
  Administered 2021-09-23 – 2021-09-27 (×10): 5 mg via ORAL
  Filled 2021-09-22 (×10): qty 1

## 2021-09-22 MED ORDER — CYCLOSPORINE 0.05 % OP EMUL
1.0000 [drp] | Freq: Two times a day (BID) | OPHTHALMIC | Status: DC | PRN
  Filled 2021-09-22: qty 30

## 2021-09-22 MED ORDER — INSULIN ASPART 100 UNIT/ML IJ SOLN
0.0000 [IU] | Freq: Three times a day (TID) | INTRAMUSCULAR | Status: DC
  Administered 2021-09-23: 3 [IU] via SUBCUTANEOUS

## 2021-09-22 MED ORDER — POTASSIUM CHLORIDE CRYS ER 20 MEQ PO TBCR
20.0000 meq | EXTENDED_RELEASE_TABLET | Freq: Every day | ORAL | Status: DC
  Administered 2021-09-23 – 2021-09-27 (×5): 20 meq via ORAL
  Filled 2021-09-22 (×5): qty 1

## 2021-09-22 MED ORDER — ONDANSETRON HCL 4 MG/2ML IJ SOLN: 4.0000 mg | Freq: Four times a day (QID) | INTRAMUSCULAR | Status: DC | PRN

## 2021-09-22 MED ORDER — TETANUS-DIPHTH-ACELL PERTUSSIS 5-2.5-18.5 LF-MCG/0.5 IM SUSY
0.5000 mL | PREFILLED_SYRINGE | Freq: Once | INTRAMUSCULAR | Status: AC
  Administered 2021-09-22: 0.5 mL via INTRAMUSCULAR
  Filled 2021-09-22: qty 0.5

## 2021-09-22 MED ORDER — ACETAMINOPHEN 325 MG PO TABS
650.0000 mg | ORAL_TABLET | Freq: Four times a day (QID) | ORAL | Status: DC | PRN
  Filled 2021-09-22 (×5): qty 2

## 2021-09-22 MED ORDER — VANCOMYCIN HCL IN DEXTROSE 1-5 GM/200ML-% IV SOLN
1000.0000 mg | Freq: Once | INTRAVENOUS | Status: AC
  Administered 2021-09-22: 1000 mg via INTRAVENOUS
  Filled 2021-09-22: qty 200

## 2021-09-22 MED ORDER — VITAMIN B-12 1000 MCG PO TABS
1000.0000 ug | ORAL_TABLET | Freq: Every day | ORAL | Status: DC
  Administered 2021-09-23 – 2021-09-27 (×5): 1000 ug via ORAL
  Filled 2021-09-22 (×5): qty 1

## 2021-09-22 MED ORDER — ACETAMINOPHEN 325 MG PO TABS
650.0000 mg | ORAL_TABLET | Freq: Four times a day (QID) | ORAL | Status: DC | PRN
  Administered 2021-09-23 – 2021-09-26 (×6): 650 mg via ORAL
  Filled 2021-09-22: qty 2

## 2021-09-22 MED ORDER — TRAZODONE HCL 50 MG PO TABS
100.0000 mg | ORAL_TABLET | Freq: Every day | ORAL | Status: DC
  Administered 2021-09-23 – 2021-09-26 (×5): 100 mg via ORAL
  Filled 2021-09-22 (×5): qty 2

## 2021-09-22 MED ORDER — TAMSULOSIN HCL 0.4 MG PO CAPS
0.8000 mg | ORAL_CAPSULE | Freq: Every day | ORAL | Status: DC
  Administered 2021-09-23 – 2021-09-26 (×5): 0.8 mg via ORAL
  Filled 2021-09-22 (×5): qty 2

## 2021-09-22 MED ORDER — ATORVASTATIN CALCIUM 40 MG PO TABS
40.0000 mg | ORAL_TABLET | Freq: Every day | ORAL | Status: DC
  Administered 2021-09-23 – 2021-09-26 (×5): 40 mg via ORAL
  Filled 2021-09-22 (×5): qty 1

## 2021-09-22 MED ORDER — CALCIUM CARBONATE-VITAMIN D 500-200 MG-UNIT PO TABS
1.0000 | ORAL_TABLET | Freq: Two times a day (BID) | ORAL | Status: DC
  Administered 2021-09-23 – 2021-09-27 (×10): 1 via ORAL
  Filled 2021-09-22 (×10): qty 1

## 2021-09-22 MED ORDER — ONDANSETRON HCL 4 MG PO TABS: 4.0000 mg | ORAL_TABLET | Freq: Four times a day (QID) | ORAL | Status: DC | PRN

## 2021-09-22 MED ORDER — ENSURE ENLIVE PO LIQD
237.0000 mL | Freq: Two times a day (BID) | ORAL | Status: DC
  Administered 2021-09-23: 237 mL via ORAL

## 2021-09-22 MED ORDER — SODIUM CHLORIDE 0.9 % IV SOLN: INTRAVENOUS | Status: DC

## 2021-09-22 MED ORDER — PANTOPRAZOLE SODIUM 40 MG PO TBEC
40.0000 mg | DELAYED_RELEASE_TABLET | Freq: Every day | ORAL | Status: DC
Start: 2021-09-23 — End: 2021-09-28
  Administered 2021-09-23 – 2021-09-27 (×5): 40 mg via ORAL
  Filled 2021-09-22 (×5): qty 1

## 2021-09-22 MED ORDER — FERROUS SULFATE 325 (65 FE) MG PO TABS
325.0000 mg | ORAL_TABLET | Freq: Every day | ORAL | Status: DC
  Administered 2021-09-23 – 2021-09-27 (×5): 325 mg via ORAL
  Filled 2021-09-22 (×5): qty 1

## 2021-09-22 MED ORDER — LACTATED RINGERS IV SOLN: INTRAVENOUS | Status: AC

## 2021-09-22 MED ORDER — INFLUENZA VAC A&B SA ADJ QUAD 0.5 ML IM PRSY
0.5000 mL | PREFILLED_SYRINGE | INTRAMUSCULAR | Status: AC
  Administered 2021-09-26: 0.5 mL via INTRAMUSCULAR
  Filled 2021-09-22 (×2): qty 0.5

## 2021-09-22 MED ORDER — ADULT MULTIVITAMIN W/MINERALS CH
1.0000 | ORAL_TABLET | Freq: Every day | ORAL | Status: DC
  Administered 2021-09-23 – 2021-09-27 (×5): 1 via ORAL
  Filled 2021-09-22 (×5): qty 1

## 2021-09-22 MED ORDER — FLUOXETINE HCL 20 MG PO CAPS
40.0000 mg | ORAL_CAPSULE | Freq: Every day | ORAL | Status: DC
  Administered 2021-09-23 – 2021-09-27 (×5): 40 mg via ORAL
  Filled 2021-09-22 (×5): qty 2

## 2021-09-22 MED ORDER — ACETAMINOPHEN 650 MG RE SUPP: 650.0000 mg | Freq: Four times a day (QID) | RECTAL | Status: DC | PRN

## 2021-09-22 NOTE — ED Notes (Signed)
Report called to Stephan Minister RN [facility]

## 2021-09-22 NOTE — ED Triage Notes (Signed)
Pt currently receiving chemo and noticed yesterday a small red spot on his foot that turned into a large weeping blood blister this morning.  Pt also reports foot and leg swelling since yesterday.  Pt h/o type 2 diabetes, insulin dependent.

## 2021-09-22 NOTE — Progress Notes (Signed)
Pharmacy Antibiotic Note  [name] is a 75 y.o. male admitted on 09/22/2021 with cellulitis of right foot.  Pharmacy has been consulted for vancomycin and cefepime dosing.  Plan: Vancomycin 1000 mg IV Q 12 hrs. Goal AUC 400-550. Expected AUC: 509 SCr used: 0.82  Cefepime 2 g iv q 8 hours  Will f/u renal function, culture results, and clinical course Levels if/when indicated   Height: 5\' 11"  (180.3 cm) Weight: 108.9 kg (240 lb) IBW/kg (Calculated) : 75.3  Temp (24hrs), Avg:98.3 F (36.8 C), Min:98.2 F (36.8 C), Max:98.4 F (36.9 C)  Recent Labs  Lab 09/18/21 0920 09/22/21 1157 09/22/21 1224  WBC 10.5 7.4  --   CREATININE 0.99 0.82  --   LATICACIDVEN  --   --  1.1    Estimated Creatinine Clearance: 96.2 mL/min (by C-G formula based on SCr of 0.82 mg/dL).    Allergies  Allergen Reactions   Furosemide Other (See Comments)    Unknown   Keflex [Cephalexin] Other (See Comments)    Hallucinations?   Lisinopril Other (See Comments)    Unknown   Terazosin Other (See Comments)    Unknown     Thank you for allowing pharmacy to be a part of this patient's care.  Napoleon Form 09/22/2021 7:43 PM

## 2021-09-22 NOTE — Progress Notes (Addendum)
Hypoglycemic Event  CBG: 55 @ 22:00  Treatment: 4 oz of orange juice   Symptoms: None  Follow-up CBG: Time: 22:30 CBG Result: 79  Possible Reasons for Event:   Comments/MD notified: X. Kennon Holter, NP and Harlow Ohms, MD    [name]

## 2021-09-22 NOTE — ED Provider Notes (Signed)
[facility] EMERGENCY DEPT Provider Note   CSN: [id number] Arrival date & time: 09/22/21  1104     History Chief Complaint  Patient presents with   Diabetic foot ulcer    [name] is a 75 y.o. male.  The history is provided by the patient, the spouse and medical records. No language interpreter was used.   75 year old male significant history of diabetes, atrial fibrillation on Eliquis, CAD, hypertension, peripheral neuropathy, pancreatic cancer receiving chemo presenting complaining of foot blister.  Patient report for the past 2 to 3 days he has noticed pain to his right foot.  He also endorsed having some subjective fever and chills.  Pain is an achy throbbing sensation mild to moderate in severity.  He has peripheral neuropathy and states he does not have a sensation to his legs.  He has his wife to check his foot yesterday and she noticed a small area of redness however today it has increased in size, turns into blister and oozing of fluid.  He does not complain of any nausea vomiting diarrhea chest pain shortness of breath.  Last chemo treatment was 5 days ago.  Patient report he normally has a fever after his chemo treatment.  He is unsure of his last tetanus status.  Past Medical History:  Diagnosis Date   A-fib ([place]) 03/04/2021   Anemia    Arthritis    Cancer (HCC)    prostate   Chronic kidney disease    blood in urine    Constipation    Coronary artery disease    Depression    Diabetes mellitus without complication ([place])    Difficult intubation    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 it was hard to get the tube down his throat   Dyspnea    Family history of breast cancer    Fatty liver    Fatty liver    Frequent headaches    GERD (gastroesophageal reflux disease)    History of chicken pox    History of fainting spells of unknown cause    History of prostate cancer    Hyperlipidemia    Hypertension    Myocardial infarction [facility])    Peripheral  neuropathy    Pneumonia    Prostate cancer ([place])    PTSD (post-traumatic stress disorder)    Sleep apnea    uses Cpap    Patient Active Problem List   Diagnosis Date Noted   Gastritis, 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   Malnutrition of moderate degree 07/02/2021   Pancreatic cancer ([place]) 07/01/2021   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   Coronary artery disease of bypass graft of native heart with stable angina pectoris ([place]) 06/04/2021   Genetic testing 04/04/2021   History of prostate cancer    Family history of breast cancer    Port-A-Cath in place 03/14/2021   Atrial fibrillation ([place])    Syncope and collapse 03/04/2021   Primary cancer of body of pancreas ([place]) 01/31/2021   Chest pain of uncertain etiology 50/38/8828   Elevated lipase 10/17/2020   H/O agent Orange exposure 10/17/2020   Dyslipidemia, goal LDL below 70 10/17/2020   Hx of CABG 02/28/2020   DM (diabetes mellitus), type 2 with renal complications ([place]) 00/34/9179   Hypertension with heart disease 04/01/2019   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   CKD (chronic kidney disease), stage III ([place]) 04/01/2019   Peripheral neuropathy 04/01/2019   Insomnia 11/30/2018    Past Surgical History:  Procedure Laterality Date  APPENDECTOMY     BIOPSY  01/15/2021   Procedure: BIOPSY;  Surgeon: Rush Landmark Telford Nab., MD;  Location: [facility];  Service: Gastroenterology;;   CARDIAC CATHETERIZATION  06/26/2017   CARDIAC SURGERY     Triple Bypass   CHOLECYSTECTOMY  2010   COLONOSCOPY     CORONARY ARTERY BYPASS GRAFT  2004   ESOPHAGOGASTRODUODENOSCOPY (EGD) WITH PROPOFOL N/A 01/15/2021   Procedure: ESOPHAGOGASTRODUODENOSCOPY (EGD) WITH PROPOFOL;  Surgeon: Irving Copas., MD;  Location: [facility];  Service: Gastroenterology;  Laterality: N/A;   EUS N/A 01/15/2021   Procedure: UPPER ENDOSCOPIC ULTRASOUND (EUS) RADIAL;  Surgeon: Irving Copas., MD;  Location: [facility];  Service:  Gastroenterology;  Laterality: N/A;   EYE SURGERY Bilateral    cataract removal   FINE NEEDLE ASPIRATION  01/15/2021   Procedure: FINE NEEDLE ASPIRATION (FNA) LINEAR;  Surgeon: Irving Copas., MD;  Location: [facility];  Service: Gastroenterology;;   LAPAROSCOPY N/A 07/01/2021   Procedure: STAGING LAPAROSCOPY;  Surgeon: Dwan Bolt, MD;  Location: [facility];  Service: General;  Laterality: N/A;   PORTACATH PLACEMENT Right 02/21/2021   Procedure: INSERTION PORT-A-CATH;  Surgeon: Dwan Bolt, MD;  Location: [facility];  Service: General;  Laterality: Right;   SPLENECTOMY, 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   Procedure: SPLENECTOMY;  Surgeon: Dwan Bolt, MD;  Location: [facility];  Service: General;  Laterality: N/A;   TONSILLECTOMY  1958       Family History  Problem Relation Age of Onset   Arthritis Mother    Heart disease Mother    Hyperlipidemia Mother    Hypertension Mother    Heart attack Mother    Breast cancer Sister        dx 49s   Heart attack Brother    Heart disease Brother    Depression Daughter    Arthritis Sister    Cancer Sister        unknown type, dx 21s   Cancer Brother    Learning disabilities Brother    Alcohol abuse Brother    COPD Brother    Heart attack Brother    Heart disease Brother     Social History   Tobacco Use   Smoking status: Former    Types: Cigarettes    Quit date: 12/14/1975    Years since quitting: 45.8   Smokeless tobacco: Never  Substance Use Topics   Alcohol use: Not Currently   Drug use: Never    Home Medications Prior to Admission medications   Medication Sig Start Date End Date Taking? Authorizing Provider  apixaban (ELIQUIS) 5 MG TABS tablet Take 5 mg by mouth 2 (two) times daily.   Yes [provider]  atorvastatin (LIPITOR) 80 MG tablet Take 40 mg by mouth at bedtime. 09/22/19  Yes [provider]  ferrous sulfate 325 (65 FE) MG tablet Take 325 mg by mouth daily with breakfast.   Yes [provider]  FLUoxetine (PROZAC) 20 MG capsule Take 40 mg by mouth daily. 09/22/19  Yes [provider]  insulin glargine (LANTUS) 100 UNIT/ML injection Inject 0.3 mLs (30 Units total) into the skin daily. 07/09/21  Yes Dwan Bolt, MD  pantoprazole (PROTONIX) 40 MG tablet Take 1 tablet (40 mg total) by mouth daily. 07/15/21  Yes [name], [name] G, MD  potassium chloride SA (KLOR-CON) 20 MEQ tablet Take 1 tablet (20 mEq total) by mouth daily. 09/18/21  Yes Ladell Pier, MD  tamsulosin (FLOMAX) 0.4 MG CAPS capsule Take  0.8 mg by mouth at bedtime. 03/27/21  Yes [provider]  traZODone (DESYREL) 100 MG tablet Take 100 mg by mouth at bedtime.   Yes [provider]  acetaminophen (TYLENOL) 325 MG tablet Take 2 tablets (650 mg total) by mouth every 6 (six) hours as needed for mild pain. 07/09/21   Dwan Bolt, MD  Blood Glucose Monitoring Suppl ([place]) w/Device KIT Use to test blood sugars 1-2 times daily. Patient not taking: Reported on 09/18/2021 07/26/21   [name], [name] G, MD  Blood Pressure Monitoring (BLOOD PRESSURE MONITOR/L CUFF) MISC Use to check blood pressures. Patient not taking: Reported on 09/18/2021 07/26/21   [name], [name] G, MD  calcium-vitamin D (OSCAL WITH D) 500-200 MG-UNIT tablet Take 1 tablet by mouth 2 (two) times daily.    [provider]  clotrimazole-betamethasone (LOTRISONE) cream Apply 1 application topically daily. 06/04/21   [name], [name] G, MD  Continuous Blood Gluc Receiver (FREESTYLE LIBRE READER) DEVI 1 Device by Does not apply route as directed. Patient not taking: Reported on 09/18/2021 07/15/21   [name], [name] G, MD  Continuous Blood Gluc Sensor (FREESTYLE LIBRE SENSOR SYSTEM) MISC 1 Device by Does not apply route 3 (three) times daily as needed. Patient not taking: Reported on 09/18/2021 07/15/21   [name], [name] G, MD  cycloSPORINE (RESTASIS) 0.05 % ophthalmic emulsion Place 1 drop into both eyes 2 (two) times daily as needed  (dry eyes).    [provider]  fluticasone (FLONASE) 50 MCG/ACT nasal spray INSTILL 2 SPRAYS IN EACH NOSTRIL EVERY EVENING Patient not taking: Reported on 09/18/2021 10/31/20   Rozetta Nunnery, MD  glucose blood (ONETOUCH VERIO) test strip Use to test blood sugars 1-2 times daily. Patient not taking: Reported on 09/18/2021 07/26/21   [name], [name] G, MD  Lancets [facility] ULTRASOFT) lancets Use to test blood sugars 1-2 times daily. Patient not taking: Reported on 09/18/2021 07/26/21   [name], [name] G, MD  lidocaine-prilocaine (EMLA) cream Apply to port site 1-2 hours prior to use Patient not taking: Reported on 09/18/2021 02/14/21   Owens Shark, NP  Meth-Hyo-M Barnett Hatter Phos-Ph Sal (URIBEL PO) Take by mouth.    [provider]  Multiple Vitamin (MULTI-VITAMINS) TABS Take 1 tablet by mouth daily.    [provider]  nitroGLYCERIN (NITROSTAT) 0.4 MG SL tablet Place 0.4 mg under the tongue every 5 (five) minutes as needed for chest pain. Patient not taking: Reported on 09/18/2021    [provider]  nystatin (MYCOSTATIN/NYSTOP) powder Apply 1 application topically 3 (three) times daily. Patient not taking: Reported on 09/18/2021 06/04/21   [name], [name] G, MD  prochlorperazine (COMPAZINE) 10 MG tablet Take 1 tablet (10 mg total) by mouth every 6 (six) hours as needed for nausea or vomiting. Patient not taking: Reported on 09/18/2021 02/14/21   Owens Shark, NP  vitamin B-12 (CYANOCOBALAMIN) 500 MCG tablet Take 500 mcg by mouth daily.    [provider]  Wheat Dextrin (BENEFIBER PO) Take 1 tablet by mouth daily.    [provider]    Allergies    Furosemide, Keflex [cephalexin], Lisinopril, and Terazosin  Review of Systems   Review of Systems  All other systems reviewed and are negative.  Physical Exam Updated Vital Signs BP 140/79 (BP Location: Right Arm)   Pulse 93   Temp 98.2 F (36.8 C) (Oral)   Resp 16   Ht _0  (1.803 m)   Wt  108.9 kg  SpO2 97%   BMI 33.47 kg/m   Physical Exam Vitals and nursing note reviewed.  Constitutional:      General: He is not in acute distress.    Appearance: He is well-developed.  HENT:     Head: Atraumatic.  Eyes:     Conjunctiva/sclera: Conjunctivae normal.  Cardiovascular:     Rate and Rhythm: Tachycardia present.  Pulmonary:     Effort: Pulmonary effort is normal.     Breath sounds: Normal breath sounds.  Abdominal:     Palpations: Abdomen is soft.  Musculoskeletal:     Cervical back: Neck supple.  Skin:    Capillary Refill: Capillary refill takes less than 2 seconds.     Findings: No rash.     Comments: Right foot: There is an infected blister noted to the lateral aspect of the foot with surrounding skin erythema edema and warmth appreciated.  Blisters oozing out purulent discharge.  There is a callus underneath his foot.  Neurological:     Mental Status: He is alert and oriented to person, place, and time.  Psychiatric:        Mood and Affect: Mood normal.    ED Results / Procedures / Treatments   Labs (all labs ordered are listed, but only abnormal results are displayed) Labs Reviewed  COMPREHENSIVE METABOLIC PANEL - Abnormal; Notable for the following components:      Result Value   Glucose, Bld 153 (*)    Calcium 8.1 (*)    Total Protein 5.8 (*)    Albumin 3.0 (*)    AST 42 (*)    All other components within normal limits  CBC WITH DIFFERENTIAL/PLATELET - Abnormal; Notable for the following components:   RBC 3.65 (*)    Hemoglobin 9.8 (*)    HCT 31.0 (*)    RDW 20.8 (*)    All other components within normal limits  SEDIMENTATION RATE - Abnormal; Notable for the following components:   Sed Rate 35 (*)    All other components within normal limits  PROTIME-INR - Abnormal; Notable for the following components:   Prothrombin Time 21.0 (*)    INR 1.8 (*)    All other components within normal limits  APTT - Abnormal; Notable for the following  components:   aPTT >200 (*)    All other components within normal limits  RESP PANEL BY RT-PCR (FLU A&B, COVID) ARPGX2  CULTURE, BLOOD (SINGLE)  LACTIC ACID, PLASMA  C-REACTIVE PROTEIN  LACTIC ACID, PLASMA    EKG None ED ECG REPORT   Date: 09/22/2021  Rate: 88  Rhythm: normal sinus rhythm  QRS Axis: left  Intervals: normal  ST/T Wave abnormalities: nonspecific T wave changes  Conduction Disutrbances:none  Narrative Interpretation:   Old EKG Reviewed: unchanged  I have personally reviewed the EKG tracing and agree with the computerized printout as noted.   Radiology DG Chest Port 1 View  Result Date: 09/22/2021 CLINICAL DATA:  Questionable sepsis. EXAM: PORTABLE CHEST 1 VIEW COMPARISON:  March 04, 2021 FINDINGS: The heart size and mediastinal contours are stable. Heart size is enlarged. Right central venous line is identified distal tip in the superior vena cava unchanged. Both lungs are clear. The visualized skeletal structures are stable. IMPRESSION: No active cardiopulmonary disease identified. Electronically Signed   By: Abelardo Diesel M.D.   On: 09/22/2021 13:01   DG Foot Complete Right  Result Date: 09/22/2021 CLINICAL DATA:  Diabetic foot ulcer of the distal fifth digit. EXAM: RIGHT FOOT  COMPLETE - 3+ VIEW COMPARISON:  None. FINDINGS: There is no evidence of fracture or dislocation. No bony destruction is identified. There is soft tissue swelling of the right fifth digit near the proximal interphalangeal joint. IMPRESSION: No evidence of osteomyelitis. Soft tissue swelling of the right fifth digit. Electronically Signed   By: Abelardo Diesel M.D.   On: 09/22/2021 13:03    Procedures Procedures   Medications Ordered in ED Medications  lactated ringers infusion ( Intravenous New Bag/Given 09/22/21 1243)  vancomycin (VANCOCIN) IVPB 1000 mg/200 mL premix (1,000 mg Intravenous New Bag/Given 09/22/21 1311)  Tdap (BOOSTRIX) injection 0.5 mL (0.5 mLs Intramuscular Given 09/22/21  1311)    ED Course  I have reviewed the triage vital signs and the nursing notes.  Pertinent labs & imaging results that were available during my care of the patient were reviewed by me and considered in my medical decision making (see chart for details).    MDM Rules/Calculators/A&P                           BP (!) 161/147   Pulse 87   Temp 98.2 F (36.8 C) (Oral)   Resp 16   Ht _0  (1.803 m)   Wt 108.9 kg   SpO2 100%   BMI 33.47 kg/m   Final Clinical Impression(s) / ED Diagnoses Final diagnoses:  Cellulitis of right foot    Rx / DC Orders ED Discharge Orders     None      12:20 PM Patient here with cellulitis and a blister noted to his right foot concerning for diabetic foot ulcer with surrounding infection.  He is also immune compromised as he is currently receiving chemotherapy.  Has significant history of peripheral neuropathy and have only noticed this lesion for the past 2 days.  It does not appear to be necrotizing fasciitis but due to his immunocompromise status, will initiate antibiotic, check labs, obtain x-ray of the foot and likely admission.  Care discussed with Dr. Matilde Sprang.   2:33 PM Labs remarkable for elevated PTT of greater than 200, INR is 1.8 and this patient who is currently on Eliquis.  Fortunately normal WBC and normal lactic acid.  X-ray of right foot without evidence of osteomyelitis.  Patient is currently received vancomycin, appreciate consultation from hospitalist, Dr. Cathlean Sauer who agrees to admit patient to [facility] for further management of his infected diabetic right foot ulcer.  Patient voiced understanding and agrees with plan.   Domenic Moras, PA-C 09/22/21 1437    Kommor, Debe Coder, MD 09/22/21 1610

## 2021-09-22 NOTE — H&P (Signed)
History and Physical   [name] MMN:[id number] DOB: [birth year] DOA: 09/22/2021  Referring MD/NP/PA: Dr. Matilde Sprang  PCP: [name], [name] G, MD   Outpatient Specialists: None  Patient coming from: Home  Chief Complaint: Right foot pain and also  HPI: [name] is a 74 y.o. male with medical history significant of diabetes, atrial fibrillation on chronic anticoagulation, pancreatic adenocarcinoma being followed by Dr. Donneta Romberg, chronic kidney disease, constipation, coronary artery disease, osteoarthritis, depression, GERD, hyperlipidemia and essential hypertension, peripheral neuropathy and peripheral vascular disease who woke up this morning with ulcer at the lateral aspect of his foot.  When he went to bed last night his foot was fine his wife checked it there was no lesion but this morning he noted the lesion on the side of his fifth digit.  Within hours this is spread with redness pain involving the whole leg.  He went to the ER at [facility] where he was seen and evaluated.  Patient being admitted with infected wound and cellulitis on the right leg and foot.  ED Course: Temperature 99.6, blood pressure 170/73, pulse 95 respirate 20 oxygen sat 93% on room air.  White count 7.4 hemoglobin 9.8 and platelets 216.  Sodium 135 potassium 3.8 chloride 104 CO2 23, BUN 19 creatinine 0.82 and calcium 8.1.  INR is 1.8 glucose 153.  COVID-19 and influenza negative.  Chest x-ray so far negative x-ray of the foot also showed no acute findings.  Review of Systems: As per HPI otherwise 10 point review of systems negative.    Past Medical History:  Diagnosis Date   A-fib ([place]) 03/04/2021   Anemia    Arthritis    Cancer (HCC)    prostate   Chronic kidney disease    blood in urine    Constipation    Coronary artery disease    Depression    Diabetes mellitus without complication ([place])    Difficult intubation    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 it was hard to get the tube down his throat    Dyspnea    Family history of breast cancer    Fatty liver    Fatty liver    Frequent headaches    GERD (gastroesophageal reflux disease)    History of chicken pox    History of fainting spells of unknown cause    History of prostate cancer    Hyperlipidemia    Hypertension    Myocardial infarction [facility])    Peripheral neuropathy    Pneumonia    Prostate cancer ([place])    PTSD (post-traumatic stress disorder)    Sleep apnea    uses Cpap    Past Surgical History:  Procedure Laterality Date   APPENDECTOMY     BIOPSY  01/15/2021   Procedure: BIOPSY;  Surgeon: [place] Copas., MD;  Location: [place];  Service: Gastroenterology;;   CARDIAC CATHETERIZATION  06/26/2017   CARDIAC SURGERY     Triple Bypass   CHOLECYSTECTOMY  2010   COLONOSCOPY     CORONARY ARTERY BYPASS GRAFT  2004   ESOPHAGOGASTRODUODENOSCOPY (EGD) WITH PROPOFOL N/A 01/15/2021   Procedure: ESOPHAGOGASTRODUODENOSCOPY (EGD) WITH PROPOFOL;  Surgeon: [place] Copas., MD;  Location: [facility] ENDOSCOPY;  Service: Gastroenterology;  Laterality: N/A;   EUS N/A 01/15/2021   Procedure: UPPER ENDOSCOPIC ULTRASOUND (EUS) RADIAL;  Surgeon: [place] Copas., MD;  Location: [place];  Service: Gastroenterology;  Laterality: N/A;   EYE SURGERY Bilateral    cataract removal   FINE NEEDLE ASPIRATION  01/15/2021   Procedure: FINE NEEDLE ASPIRATION (FNA) LINEAR;  Surgeon: [place] Copas., MD;  Location: [facility] ENDOSCOPY;  Service: Gastroenterology;;   LAPAROSCOPY N/A 07/01/2021   Procedure: STAGING LAPAROSCOPY;  Surgeon: Dwan Bolt, MD;  Location: [place];  Service: General;  Laterality: N/A;   PORTACATH PLACEMENT Right 02/21/2021   Procedure: INSERTION PORT-A-CATH;  Surgeon: Dwan Bolt, MD;  Location: [place];  Service: General;  Laterality: Right;   SPLENECTOMY, 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   Procedure: SPLENECTOMY;  Surgeon: Dwan Bolt, MD;  Location: [place];  Service: General;  Laterality: N/A;    [place]     reports that he quit smoking about 45 years ago. His smoking use included cigarettes. He has never used smokeless tobacco. He reports that he does not currently use alcohol. He reports that he does not use drugs.  Allergies  Allergen Reactions   Furosemide Other (See Comments)    Unknown   Keflex [Cephalexin] Other (See Comments)    Hallucinations?   Lisinopril Other (See Comments)    Unknown   Terazosin Other (See Comments)    Unknown    Family History  Problem Relation Age of Onset   Arthritis Mother    Heart disease Mother    Hyperlipidemia Mother    Hypertension Mother    Heart attack Mother    Breast cancer Sister        dx 23s   Heart attack Brother    Heart disease Brother    Depression Daughter    Arthritis Sister    Cancer Sister        unknown type, dx 43s   Cancer Brother    Learning disabilities Brother    Alcohol abuse Brother    COPD Brother    Heart attack Brother    Heart disease Brother      Prior to Admission medications   Medication Sig Start Date End Date Taking? Authorizing Provider  acetaminophen (TYLENOL) 325 MG tablet Take 2 tablets (650 mg total) by mouth every 6 (six) hours as needed for mild pain. Patient taking differently: Take 650 mg by mouth every 6 (six) hours as needed for mild pain or fever. 07/09/21  Yes Dwan Bolt, MD  apixaban (ELIQUIS) 5 MG TABS tablet Take 5 mg by mouth 2 (two) times daily.   Yes [provider]  atorvastatin (LIPITOR) 80 MG tablet Take 40 mg by mouth at bedtime. 09/22/19  Yes [provider]  ferrous sulfate 325 (65 FE) MG tablet Take 325 mg by mouth daily with breakfast.   Yes [provider]  FLUoxetine (PROZAC) 20 MG capsule Take 40 mg by mouth daily. 09/22/19  Yes [provider]  insulin glargine (LANTUS) 100 UNIT/ML injection Inject 0.3 mLs (30 Units total) into the skin daily. 07/09/21  Yes Dwan Bolt, MD  pantoprazole (PROTONIX) 40 MG  tablet Take 1 tablet (40 mg total) by mouth daily. 07/15/21  Yes [name], [name] G, MD  potassium chloride SA (KLOR-CON) 20 MEQ tablet Take 1 tablet (20 mEq total) by mouth daily. 09/18/21  Yes Ladell Pier, MD  tamsulosin (FLOMAX) 0.4 MG CAPS capsule Take 0.8 mg by mouth at bedtime. 03/27/21  Yes [provider]  traZODone (DESYREL) 100 MG tablet Take 100 mg by mouth at bedtime.   Yes [provider]  Blood Glucose Monitoring Suppl ([place]) w/Device KIT Use to test blood sugars 1-2 times daily. Patient not taking: Reported on  09/18/2021 07/26/21   [name], [name] G, MD  Blood Pressure Monitoring (BLOOD PRESSURE MONITOR/L CUFF) MISC Use to check blood pressures. Patient not taking: Reported on 09/18/2021 07/26/21   [name], [name] G, MD  calcium-vitamin D (OSCAL WITH D) 500-200 MG-UNIT tablet Take 1 tablet by mouth 2 (two) times daily.    [provider]  clotrimazole-betamethasone (LOTRISONE) cream Apply 1 application topically daily. 06/04/21   [name], [name] G, MD  Continuous Blood Gluc Receiver (FREESTYLE LIBRE READER) DEVI 1 Device by Does not apply route as directed. Patient not taking: Reported on 09/18/2021 07/15/21   [name], [name] G, MD  Continuous Blood Gluc Sensor (FREESTYLE LIBRE SENSOR SYSTEM) MISC 1 Device by Does not apply route 3 (three) times daily as needed. Patient not taking: Reported on 09/18/2021 07/15/21   [name], [name] G, MD  cycloSPORINE (RESTASIS) 0.05 % ophthalmic emulsion Place 1 drop into both eyes 2 (two) times daily as needed (dry eyes).    [provider]  fluticasone (FLONASE) 50 MCG/ACT nasal spray INSTILL 2 SPRAYS IN EACH NOSTRIL EVERY EVENING Patient not taking: Reported on 09/18/2021 10/31/20   Rozetta Nunnery, MD  glucose blood (ONETOUCH VERIO) test strip Use to test blood sugars 1-2 times daily. Patient not taking: Reported on 09/18/2021 07/26/21   [name], [name] G, MD  Lancets [facility] ULTRASOFT) lancets Use to test  blood sugars 1-2 times daily. Patient not taking: Reported on 09/18/2021 07/26/21   [name], [name] G, MD  lidocaine-prilocaine (EMLA) cream Apply to port site 1-2 hours prior to use Patient not taking: Reported on 09/18/2021 02/14/21   Owens Shark, NP  Meth-Hyo-M Barnett Hatter Phos-Ph Sal (URIBEL PO) Take by mouth.    [provider]  Multiple Vitamin (MULTI-VITAMINS) TABS Take 1 tablet by mouth daily.    [provider]  nitroGLYCERIN (NITROSTAT) 0.4 MG SL tablet Place 0.4 mg under the tongue every 5 (five) minutes as needed for chest pain. Patient not taking: Reported on 09/18/2021    [provider]  nystatin (MYCOSTATIN/NYSTOP) powder Apply 1 application topically 3 (three) times daily. Patient not taking: Reported on 09/18/2021 06/04/21   [name], [name] G, MD  prochlorperazine (COMPAZINE) 10 MG tablet Take 1 tablet (10 mg total) by mouth every 6 (six) hours as needed for nausea or vomiting. Patient not taking: Reported on 09/18/2021 02/14/21   Owens Shark, NP  vitamin B-12 (CYANOCOBALAMIN) 500 MCG tablet Take 500 mcg by mouth daily.    [provider]  Wheat Dextrin (BENEFIBER PO) Take 1 tablet by mouth daily.    [provider]    Physical Exam: Vitals:   09/22/21 1700 09/22/21 1818 09/22/21 2132 09/22/21 2205  BP: (!) 160/79 (!) 162/73  (!) 157/66  Pulse:  88  95  Resp:  _0 Temp:  98.4 F (36.9 C)  99.6 F (37.6 C)  TempSrc:  Oral  Oral  SpO2:  93%  100%  Weight:      Height:          Constitutional: Morbidly obese, acutely ill looking no distress Vitals:   09/22/21 1700 09/22/21 1818 09/22/21 2132 09/22/21 2205  BP: (!) 160/79 (!) 162/73  (!) 157/66  Pulse:  88  95  Resp:  _1 Temp:  98.4 F (36.9 C)  99.6 F (37.6 C)  TempSrc:  Oral  Oral  SpO2:  93%  100%  Weight:      Height:       Eyes:  PERRL, lids and conjunctivae normal ENMT: Mucous membranes are dry. Posterior pharynx clear of any exudate or lesions.Normal  dentition.  Neck: normal, supple, no masses, no thyromegaly Respiratory: clear to auscultation bilaterally, no wheezing, no crackles. Normal respiratory effort. No accessory muscle use.  Cardiovascular: Regular rate and rhythm, no murmurs / rubs / gallops. No extremity edema. 2+ pedal pulses. No carotid bruits.  Abdomen: no tenderness, no masses palpated. No hepatosplenomegaly. Bowel sounds positive.  Musculoskeletal: no clubbing / cyanosis. No joint deformity upper and lower extremities. Good ROM, no contractures. Normal muscle tone.  Right lower extremity red swollen warm to touch with evidence of significant cellulitis and ulceration on the lateral aspect of the right foot Skin: no rashes, lesions, ulcers. No induration Neurologic: CN 2-12 grossly intact. Sensation intact, DTR normal. Strength 5/5 in all 4.  Psychiatric: Normal judgment and insight. Alert and oriented x 3. Normal mood.     Labs on Admission: I have personally reviewed following labs and imaging studies  CBC: Recent Labs  Lab 09/18/21 0920 09/22/21 1157  WBC 10.5 7.4  NEUTROABS 6.5 6.3  HGB 10.8* 9.8*  HCT 34.0* 31.0*  MCV 85.6 84.9  PLT 265 938   Basic Metabolic Panel: Recent Labs  Lab 09/18/21 0920 09/22/21 1157  NA 138 135  K 3.6 3.8  CL 103 104  CO2 26 23  GLUCOSE 201* 153*  BUN 15 19  CREATININE 0.99 0.82  CALCIUM 9.2 8.1*   GFR: Estimated Creatinine Clearance: 96.2 mL/min (by C-G formula based on SCr of 0.82 mg/dL). Liver Function Tests: Recent Labs  Lab 09/18/21 0920 09/22/21 1157  AST 23 42*  ALT 17 32  ALKPHOS 110 71  BILITOT 0.4 0.6  PROT 6.7 5.8*  ALBUMIN 3.5 3.0*   No results for input(s): LIPASE, AMYLASE in the last 168 hours. No results for input(s): AMMONIA in the last 168 hours. Coagulation Profile: Recent Labs  Lab 09/22/21 1224  INR 1.8*   Cardiac Enzymes: No results for input(s): CKTOTAL, CKMB, CKMBINDEX, TROPONINI in the last 168 hours. BNP (last 3 results) No  results for input(s): PROBNP in the last 8760 hours. HbA1C: No results for input(s): HGBA1C in the last 72 hours. CBG: No results for input(s): GLUCAP in the last 168 hours. Lipid Profile: No results for input(s): CHOL, HDL, LDLCALC, TRIG, CHOLHDL, LDLDIRECT in the last 72 hours. Thyroid Function Tests: No results for input(s): TSH, T4TOTAL, FREET4, T3FREE, THYROIDAB in the last 72 hours. Anemia Panel: No results for input(s): VITAMINB12, FOLATE, FERRITIN, TIBC, IRON, RETICCTPCT in the last 72 hours. Urine analysis:    Component Value Date/Time   COLORURINE YELLOW 03/04/2021 0750   APPEARANCEUR CLEAR 03/04/2021 0750   LABSPEC 1.013 03/04/2021 0750   PHURINE 6.0 03/04/2021 0750   GLUCOSEU NEGATIVE 03/04/2021 0750   HGBUR NEGATIVE 03/04/2021 0750   BILIRUBINUR NEGATIVE 03/04/2021 0750   KETONESUR NEGATIVE 03/04/2021 0750   PROTEINUR NEGATIVE 03/04/2021 0750   NITRITE NEGATIVE 03/04/2021 0750   LEUKOCYTESUR NEGATIVE 03/04/2021 0750   Sepsis Labs: _0 (procalcitonin:4,lacticidven:4) ) Recent Results (from the past 240 hour(s))  Resp Panel by RT-PCR (Flu A&B, Covid) Peripheral     Status: None   Collection Time: 09/22/21 12:24 PM   Specimen: Peripheral; Nasopharyngeal(NP) swabs in vial transport medium  Result Value Ref Range Status   SARS Coronavirus 2 by RT PCR NEGATIVE NEGATIVE Final    Comment: (NOTE) SARS-CoV-2 target nucleic acids are NOT DETECTED.  The SARS-CoV-2 RNA is generally detectable in upper respiratory specimens  during the acute phase of infection. The lowest concentration of SARS-CoV-2 viral copies this assay can detect is 138 copies/mL. A negative result does not preclude SARS-Cov-2 infection and should not be used as the sole basis for treatment or other patient management decisions. A negative result may occur with  improper specimen collection/handling, submission of specimen other than nasopharyngeal swab, presence of viral mutation(s) within  the areas targeted by this assay, and inadequate number of viral copies(<138 copies/mL). A negative result must be combined with clinical observations, patient history, and epidemiological information. The expected result is Negative.  Fact Sheet for Patients:  EntrepreneurPulse.com.au  Fact Sheet for Healthcare Providers:  IncredibleEmployment.be  This test is no t yet approved or cleared by the [place] FDA and  has been authorized for detection and/or diagnosis of SARS-CoV-2 by FDA under an Emergency Use Authorization (EUA). This EUA will remain  in effect (meaning this test can be used) for the duration of the COVID-19 declaration under Section 564(b)(1) of the Act, 21 U.S.C.section 360bbb-3(b)(1), unless the authorization is terminated  or revoked sooner.       Influenza A by PCR NEGATIVE NEGATIVE Final   Influenza B by PCR NEGATIVE NEGATIVE Final    Comment: (NOTE) The Xpert Xpress SARS-CoV-2/FLU/RSV plus assay is intended as an aid in the diagnosis of influenza from Nasopharyngeal swab specimens and should not be used as a sole basis for treatment. Nasal washings and aspirates are unacceptable for Xpert Xpress SARS-CoV-2/FLU/RSV testing.  Fact Sheet for Patients: EntrepreneurPulse.com.au  Fact Sheet for Healthcare Providers: IncredibleEmployment.be  This test is not yet approved or cleared by the [place] FDA and has been authorized for detection and/or diagnosis of SARS-CoV-2 by FDA under an Emergency Use Authorization (EUA). This EUA will remain in effect (meaning this test can be used) for the duration of the COVID-19 declaration under Section 564(b)(1) of the Act, 21 U.S.C. section 360bbb-3(b)(1), unless the authorization is terminated or revoked.  Performed at [facility], [address], [place], [place] [address]      Radiological Exams on Admission: DG  Chest Port 1 View  Result Date: 09/22/2021 CLINICAL DATA:  Questionable sepsis. EXAM: PORTABLE CHEST 1 VIEW COMPARISON:  March 04, 2021 FINDINGS: The heart size and mediastinal contours are stable. Heart size is enlarged. Right central venous line is identified distal tip in the superior vena cava unchanged. Both lungs are clear. The visualized skeletal structures are stable. IMPRESSION: No active cardiopulmonary disease identified. Electronically Signed   By: Abelardo Diesel M.D.   On: 09/22/2021 13:01   DG Foot Complete Right  Result Date: 09/22/2021 CLINICAL DATA:  Diabetic foot ulcer of the distal fifth digit. EXAM: RIGHT FOOT COMPLETE - 3+ VIEW COMPARISON:  None. FINDINGS: There is no evidence of fracture or dislocation. No bony destruction is identified. There is soft tissue swelling of the right fifth digit near the proximal interphalangeal joint. IMPRESSION: No evidence of osteomyelitis. Soft tissue swelling of the right fifth digit. Electronically Signed   By: Abelardo Diesel M.D.   On: 09/22/2021 13:03    EKG: Independently reviewed.  Sinus rhythm  Assessment/Plan Principal Problem:   Cellulitis Active Problems:   DM (diabetes mellitus), type 2 with renal complications (HCC)   Hypertension with heart disease   CKD (chronic kidney disease), stage III (HCC)   Hx of CABG   Dyslipidemia, goal LDL below 70   Atrial fibrillation (HCC)   Coronary artery disease of bypass graft of native heart with  stable angina pectoris ([place])   Pancreatic adenocarcinoma ([place])     #1 cellulitis and wound infection of the right foot: Patient will be admitted.  Being a diabetic will need combination of vancomycin and cefepime for multi organisms.  Wound care will be ordered.  #2 insulin-dependent diabetes: Initiate sliding scale insulin with long-acting insulin as needed.  Patient had an episode of hypoglycemia.  We will monitor closely  #3 history of pancreatic adenocarcinoma: Continue outpatient  follow-up  #4 atrial fibrillation: Continue anticoagulation  #5 coronary artery disease: Stable.  No acute exacerbation  #6 essential hypertension: Continue blood pressure control.  #7 chronic kidney disease stage III: At baseline   DVT prophylaxis: Apixaban Code Status: Full code Family Communication: No family at bedside Disposition Plan: Home Consults called: None Admission status: Inpatient  Severity of Illness: The appropriate patient status for this patient is INPATIENT. Inpatient status is judged to be reasonable and necessary in order to provide the required intensity of service to ensure the patient's safety. The patient's presenting symptoms, physical exam findings, and initial radiographic and laboratory data in the context of their chronic comorbidities is felt to place them at high risk for further clinical deterioration. Furthermore, it is not anticipated that the patient will be medically stable for discharge from the hospital within 2 midnights of admission. The following factors support the patient status of inpatient.   " The patient's presenting symptoms include right foot infection. " The worrisome physical exam findings include wound and cellulitis of the right lower extremity. " The initial radiographic and laboratory data are worrisome because of evidence of leukocytosis. " The chronic co-morbidities include diabetes.   * I certify that at the point of admission it is my clinical judgment that the patient will require inpatient hospital care spanning beyond 2 midnights from the point of admission due to high intensity of service, high risk for further deterioration and high frequency of surveillance required.Barbette Merino MD [facility] Pager [phone]  If 7PM-7AM, please contact night-coverage [email or web address] Password [email or web address]  09/22/2021, 11:51 PM

## 2021-09-23 ENCOUNTER — Inpatient Hospital Stay (HOSPITAL_COMMUNITY): Payer: Medicare Other

## 2021-09-23 LAB — COMPREHENSIVE METABOLIC PANEL
ALT: 29 U/L (ref 0–44)
AST: 36 U/L (ref 15–41)
Albumin: 2.7 g/dL — ABNORMAL LOW (ref 3.5–5.0)
Alkaline Phosphatase: 76 U/L (ref 38–126)
Anion gap: 5 (ref 5–15)
BUN: 15 mg/dL (ref 8–23)
CO2: 25 mmol/L (ref 22–32)
Calcium: 8 mg/dL — ABNORMAL LOW (ref 8.9–10.3)
Chloride: 106 mmol/L (ref 98–111)
Creatinine, Ser: 0.8 mg/dL (ref 0.61–1.24)
GFR, Estimated: >60 mL/min (ref >60)
Glucose, Bld: 50 mg/dL — ABNORMAL LOW (ref 70–99)
Potassium: 3.3 mmol/L — ABNORMAL LOW (ref 3.5–5.1)
Sodium: 136 mmol/L (ref 135–145)
Total Bilirubin: 0.7 mg/dL (ref 0.3–1.2)
Total Protein: 5.5 g/dL — ABNORMAL LOW (ref 6.5–8.1)

## 2021-09-23 LAB — GLUCOSE, CAPILLARY
Glucose-Capillary: 49 mg/dL — ABNORMAL LOW (ref 70–99)
Glucose-Capillary: 55 mg/dL — ABNORMAL LOW (ref 70–99)
Glucose-Capillary: 73 mg/dL (ref 70–99)
Glucose-Capillary: 46 mg/dL — ABNORMAL LOW (ref 70–99)
Glucose-Capillary: 144 mg/dL — ABNORMAL HIGH (ref 70–99)
Glucose-Capillary: 222 mg/dL — ABNORMAL HIGH (ref 70–99)
Glucose-Capillary: 109 mg/dL — ABNORMAL HIGH (ref 70–99)

## 2021-09-23 LAB — CBC
HCT: 29.2 % — ABNORMAL LOW (ref 39.0–52.0)
Hemoglobin: 9.1 g/dL — ABNORMAL LOW (ref 13.0–17.0)
MCH: 27.2 pg (ref 26.0–34.0)
MCHC: 31.2 g/dL (ref 30.0–36.0)
MCV: 87.2 fL (ref 80.0–100.0)
Platelets: 199 10*3/uL (ref 150–400)
RBC: 3.35 MIL/uL — ABNORMAL LOW (ref 4.22–5.81)
RDW: 20.9 % — ABNORMAL HIGH (ref 11.5–15.5)
WBC: 7.9 10*3/uL (ref 4.0–10.5)
nRBC: 0 % (ref 0.0–0.2)

## 2021-09-23 LAB — HEMOGLOBIN A1C
Hgb A1c MFr Bld: 8.2 % — ABNORMAL HIGH (ref 4.8–5.6)
Mean Plasma Glucose: 188.64 mg/dL

## 2021-09-23 IMAGING — MR MR FOOT*R* W/O CM
7 series · 37 of 40 positions shown · non-contrast
Comparison: Radiographs [DATE]

CLINICAL DATA: Diabetic foot ulcer.

EXAM:
MRI OF THE RIGHT FOREFOOT WITHOUT CONTRAST
TECHNIQUE: Multiplanar, multisequence MR imaging of the right foot was
performed. No intravenous contrast was administered.

[Series 4: T1 · coronal · right · 3.5mm · 0.41mm/px · 8 of 35 slices shown (1 of 2)]
[im 1/35]
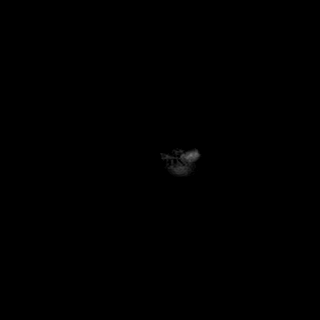
[im 5/35]
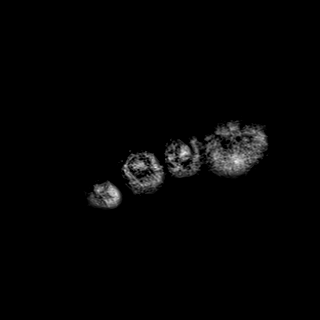
[im 10/35]
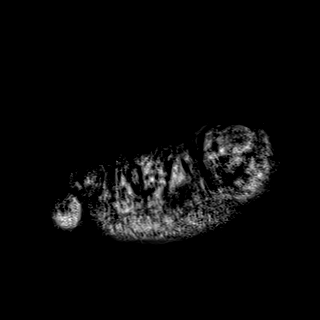
[im 15/35]
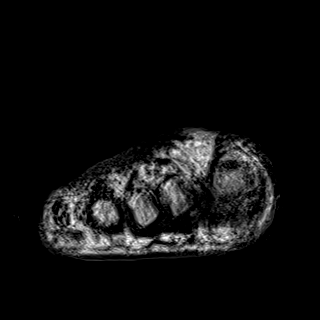
[im 20/35]
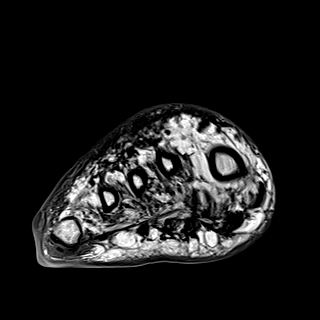
[im 25/35]
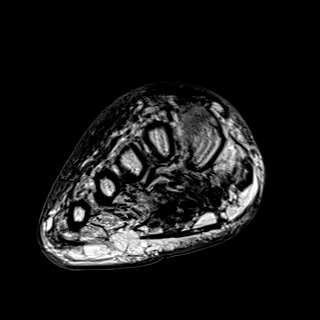
[im 30/35]
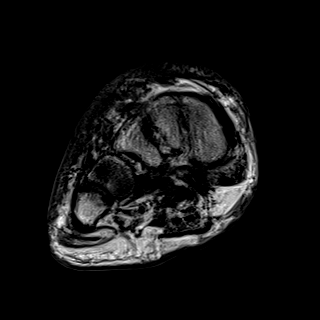
[im 35/35]
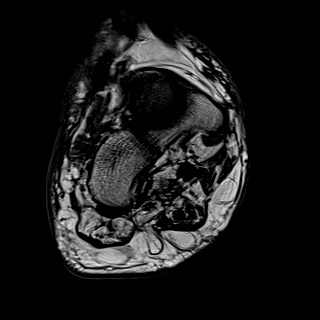

[Series 5: T2 fat-sat · coronal · right · 3.5mm · 0.41mm/px · 7 of 35 slices shown (1 of 3)]
[im 1/35]
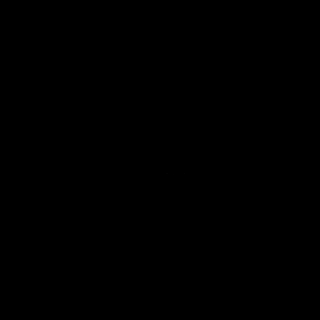
[im 6/35]
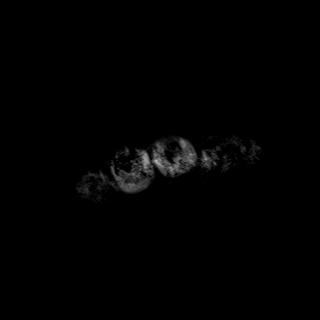
[im 12/35]
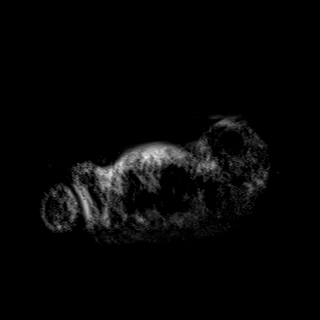
[im 18/35]
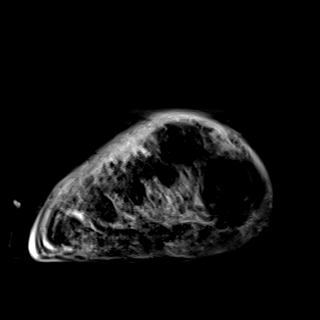
[im 23/35]
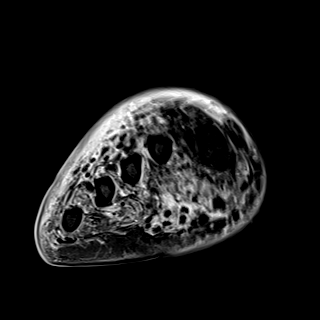
[im 29/35]
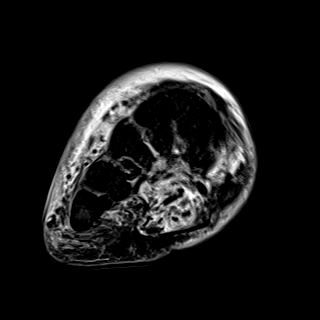
[im 35/35]
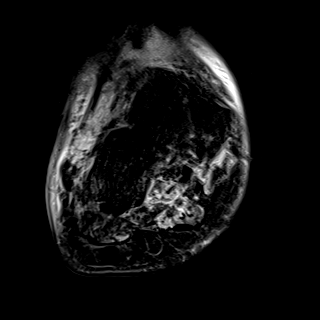

[Series 6: T2 fat-sat · axial · right · 3.3mm · 0.70mm/px · z∈[-107,-25]mm · 4 of 22 slices shown (2 of 3)]
[im 1/22]
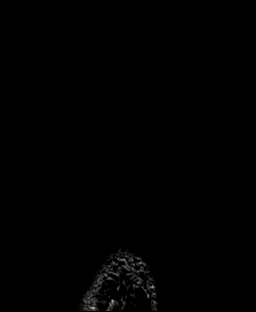
[im 8/22]
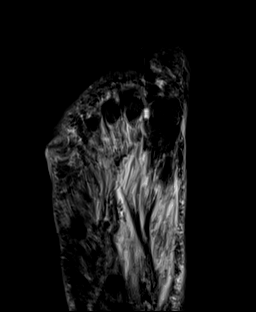
[im 15/22]
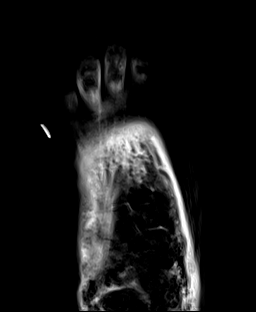
[im 22/22]
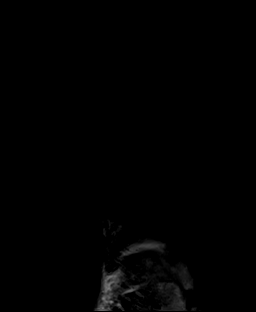

[Series 7: T1 · axial · right · 3.3mm · 0.70mm/px · z∈[-107,-25]mm · 4 of 22 slices shown (2 of 2)]
[im 1/22]
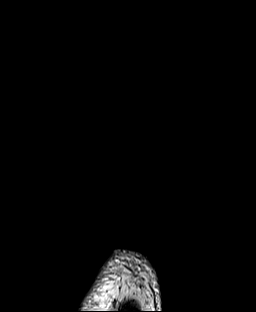
[im 8/22]
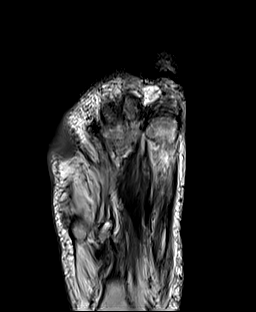
[im 15/22]
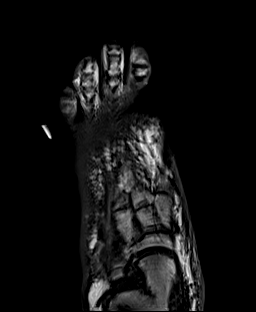
[im 22/22]
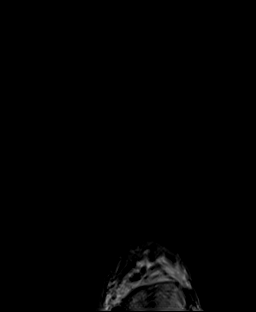

[Series 8: STIR · sagittal · right · 3.4mm · 0.35mm/px · 5 of 27 slices shown]
[im 1/27]
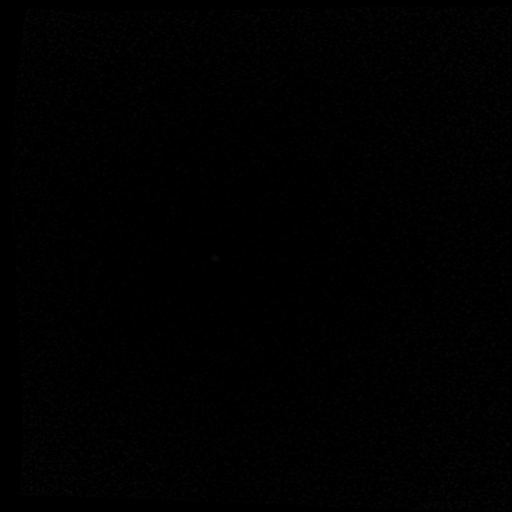
[im 7/27]
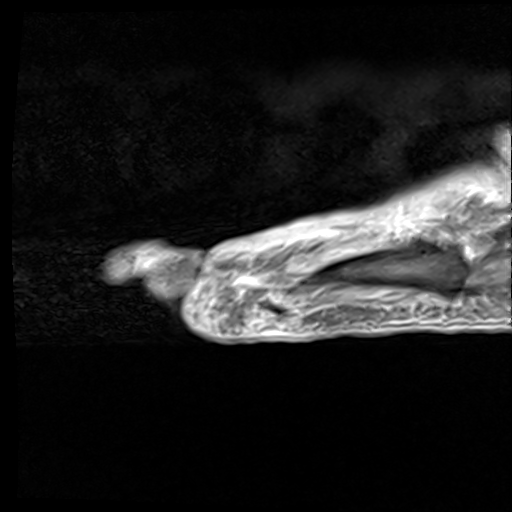
[im 14/27]
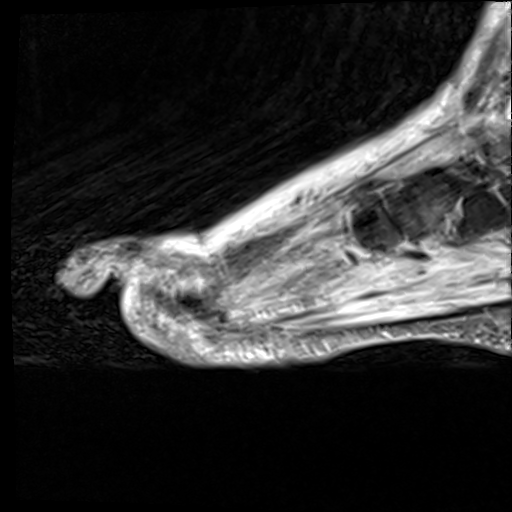
[im 20/27]
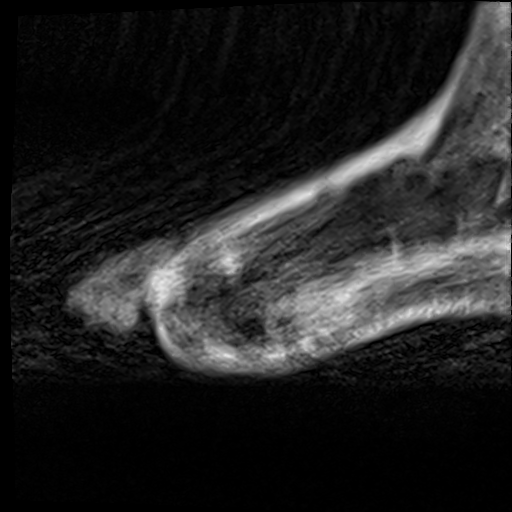
[im 27/27]
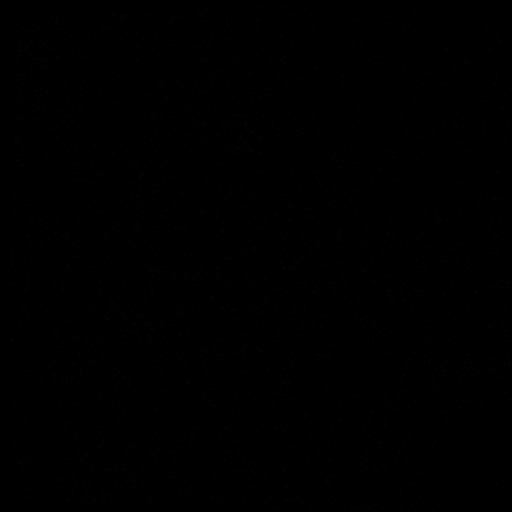

[Series 9: T2 fat-sat · coronal · right · 3.5mm · 0.41mm/px · 7 of 35 slices shown (3 of 3)]
[im 1/35]
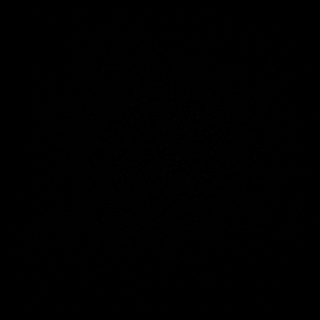
[im 6/35]
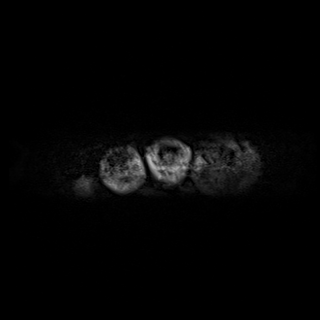
[im 12/35]
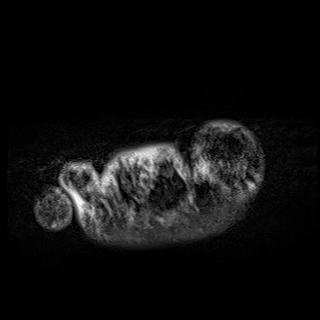
[im 18/35]
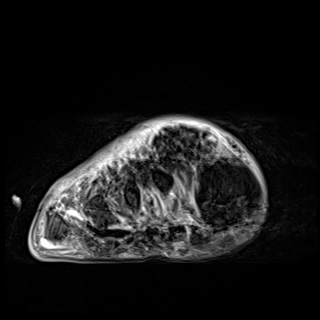
[im 23/35]
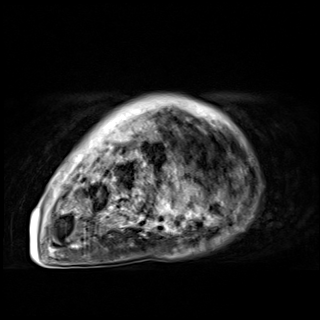
[im 29/35]
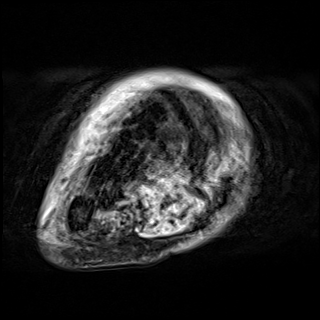
[im 35/35]
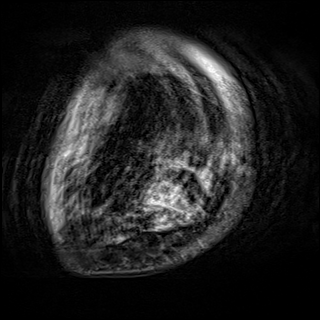

[Series 10: sag stir_repeat · sagittal · right · 3.4mm · 0.39mm/px · 2 of 27 slices shown]
[im 1/27]
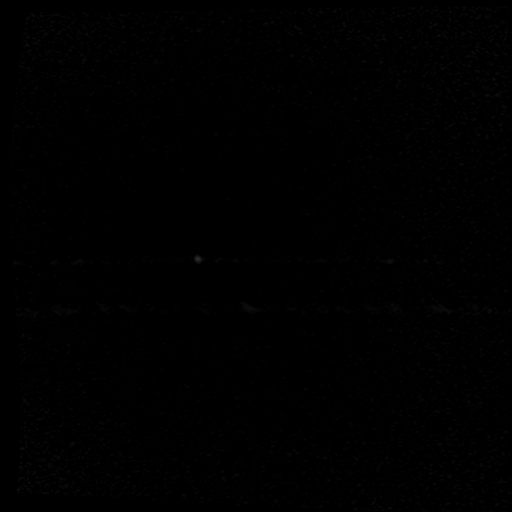
[im 7/27]
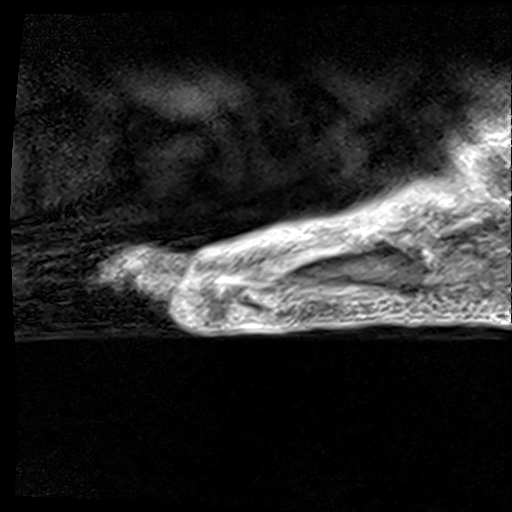

[37 of 40 positions shown; findings below may reference images not displayed]

FINDINGS: Very limited examination due to patient motion.

There is an open wound noted along the lateral aspect of the
forefoot at the level of the fifth MTP joint. There is some fluid
surrounding the fifth metatarsal head. I do not however see any
definite MR findings for septic arthritis or osteomyelitis. There is
diffuse cellulitis and myofasciitis.
IMPRESSION: 1. Very limited examination due to patient motion.
2. Open wound along the lateral aspect of the forefoot at the level
of the fifth MTP joint. There is some fluid surrounding the fifth
metatarsal head but I do not see any definite MR findings for septic
arthritis or osteomyelitis.
3. Diffuse cellulitis and myofasciitis.

## 2021-09-23 MED ORDER — INSULIN ASPART 100 UNIT/ML IJ SOLN
0.0000 [IU] | Freq: Three times a day (TID) | INTRAMUSCULAR | Status: DC
  Administered 2021-09-24: 3 [IU] via SUBCUTANEOUS
  Administered 2021-09-24: 5 [IU] via SUBCUTANEOUS
  Administered 2021-09-25: 2 [IU] via SUBCUTANEOUS
  Administered 2021-09-25: 1 [IU] via SUBCUTANEOUS
  Administered 2021-09-25: 3 [IU] via SUBCUTANEOUS
  Administered 2021-09-26 – 2021-09-27 (×5): 2 [IU] via SUBCUTANEOUS

## 2021-09-23 MED ORDER — CHLORHEXIDINE GLUCONATE CLOTH 2 % EX PADS
6.0000 | MEDICATED_PAD | Freq: Every day | CUTANEOUS | Status: DC
  Administered 2021-09-23 – 2021-09-27 (×5): 6 via TOPICAL

## 2021-09-23 MED ORDER — COVID-19MRNA BIVAL VACC PFIZER 30 MCG/0.3ML IM SUSP
0.3000 mL | Freq: Once | INTRAMUSCULAR | Status: AC
  Administered 2021-09-24: 0.3 mL via INTRAMUSCULAR
  Filled 2021-09-23: qty 0.3

## 2021-09-23 MED ORDER — ENSURE ENLIVE PO LIQD
237.0000 mL | Freq: Three times a day (TID) | ORAL | Status: DC
  Administered 2021-09-23 – 2021-09-27 (×11): 237 mL via ORAL

## 2021-09-23 MED ORDER — ASCORBIC ACID 500 MG PO TABS
250.0000 mg | ORAL_TABLET | Freq: Two times a day (BID) | ORAL | Status: DC
  Administered 2021-09-23 – 2021-09-27 (×9): 250 mg via ORAL
  Filled 2021-09-23 (×9): qty 1

## 2021-09-23 NOTE — Consult Note (Signed)
[facility] Nurse Consult Note: Reason for Consult: Nonintact lesion to right lateral foot, blood filled blister with increasing erythema and weeping periwound skin. Is on vancomycin.  Wound type:infectious  Unclear etiology  Patient states it occurred spontaneously overnight.   Pressure Injury POA: NA Measurement: 1 cm blood filled blister with surrounding erythema Wound WLK:HVFMB filled blister Drainage (amount, consistency, odor) weeping to periwound skin.  Periwound:edema and erythema to right foot/lower leg Dressing procedure/placement/frequency: Cleanse right foot wound with NS and pat dry. Apply Xeroform gauze to open wound Cover with gauze and kerlix/tape.  Change daily.  Will not follow at this time.  Please re-consult if needed.  Domenic Moras MSN, RN, FNP-BC [facility]ON Wound, Ostomy, Continence Nurse Pager [phone]

## 2021-09-23 NOTE — Progress Notes (Signed)
Progress Note:    [name]    XTG:[id number] DOB: 11-10-1946 DOA: 09/22/2021  PCP: [name], [name] G, MD    Brief Narrative:   75 year old male with a past medical history of diabetes mellitus, A. 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  Patient presented to the emergency department after waking up and finding an ulcer on the lateral aspect of his right foot.  Was noted to have some drainage.     Subjective:   Acute events overnight.  Patient seen and examined this morning.  Right foot wrapped in Kerlix.  I have unwrapped it and he has a small wound on the right aspect of his foot.  Endorsed fevers and chills on Friday and Saturday.  Denies any trauma.  Currently getting chemotherapy for his pancreatic adenocarcinoma.    Assessment and Plan:   Cellulitis and diabetic foot wound.  Continue vancomycin and cefepime.  Ordered wound culture.  Obtain MRI.  Wound care nurse following.  Right leg swelling: Obtain ultrasound to rule out DVT.  A. fib: Rate controlled.  Continue home apixaban  Pancreatic adenocarcinoma: No acute treatment  Coronary disease: No acute treatment  Hypertension: Stable.  Does not appear to be on any home medications.  Insulin-dependent diabetes mellitus: Suboptimally controlled.  8.2.  Continue SSI.   Other information:    DVT prophylaxis: Apixaban Code Status: Full Family Communication: None present at bedside Disposition:   Status is: Inpatient  Remains inpatient appropriate because:Ongoing diagnostic testing needed not appropriate for outpatient work up  Dispo: The patient is from: Home              Anticipated d/c is to: Home              Patient currently is not medically stable to d/c.   Difficult to place patient: No            Consultants:   None    Objective:    Vitals:   09/23/21 0212 09/23/21 0605 09/23/21 0800 09/23/21 1346  BP: 123/61 (!) 139/59  (!) 170/80  Pulse: 84 84  85  Resp: 18 18 18 16   Temp: 98.2 F (36.8 C) 98 F (36.7 C)  97.8 F (36.6 C)  TempSrc: Oral Oral  Oral  SpO2: 97% 95%  98%  Weight:      Height:        Intake/Output Summary (Last 24 hours) at 09/23/2021 1719 Last data filed at 09/23/2021 0510 Gross per 24 hour  Intake 1712.77 ml  Output --  Net 1712.77 ml   Filed Weights   09/22/21 1148  Weight: 108.9 kg       Physical Exam:    General exam: Appears calm and comfortable  Respiratory system: Clear to auscultation. Respiratory effort normal. Cardiovascular system: S1 & S2 heard, RRR. No JVD, murmurs, rubs, gallops or clicks.  Plus pitting edema right lower extremity which the patient states is chronic.  No edema on the left lower extremity Gastrointestinal system: Abdomen is nondistended, soft and nontender. No organomegaly or masses felt. Normal bowel sounds heard. Central nervous system: Alert and oriented. No focal neurological deficits. Extremities: Symmetric 5 x 5 power. Skin: 1 cm blood-filled blister with surrounding erythema on the lateral aspect of the right foot. Psychiatry: Judgement and insight appear normal. Mood & affect appropriate.     Data Reviewed:    I  have personally reviewed following labs and imaging studies  CBC: Recent Labs  Lab 09/18/21 0920 09/22/21 1157 09/23/21 0518  WBC 10.5 7.4 7.9  NEUTROABS 6.5 6.3  --   HGB 10.8* 9.8* 9.1*  HCT 34.0* 31.0* 29.2*  MCV 85.6 84.9 87.2  PLT 265 216 222    Basic Metabolic Panel: Recent Labs  Lab 09/18/21 0920 09/22/21 1157 09/23/21 0518  NA 138 135 136  K 3.6 3.8 3.3*  CL 103 104 106  CO2 26 23 25   GLUCOSE 201* 153* 50*  BUN 15 19 15   CREATININE 0.99 0.82 0.80  CALCIUM 9.2 8.1* 8.0*    GFR: Estimated Creatinine Clearance: 98.6 mL/min (by C-G formula based  on SCr of 0.8 mg/dL).  Liver Function Tests: Recent Labs  Lab 09/18/21 0920 09/22/21 1157 09/23/21 0518  AST 23 42* 36  ALT 17 32 29  ALKPHOS 110 71 76  BILITOT 0.4 0.6 0.7  PROT 6.7 5.8* 5.5*  ALBUMIN 3.5 3.0* 2.7*    CBG: Recent Labs  Lab 09/22/21 2204 09/22/21 2230 09/23/21 0757 09/23/21 1109 09/23/21 1548  GLUCAP 55* 73 46* 144* 222*     Recent Results (from the past 240 hour(s))  Blood culture (routine single)     Status: None (Preliminary result)   Collection Time: 09/22/21 12:20 PM   Specimen: BLOOD  Result Value Ref Range Status   Specimen Description   Final    BLOOD RIGHT PORTA CATH Performed at [facility], [address], [place], [place] [address]    Special Requests   Final    BOTTLES DRAWN AEROBIC AND ANAEROBIC Blood Culture adequate volume Performed at [facility], [address] [place], [place] [address]    Culture   Final    NO GROWTH < 24 HOURS Performed at [facility], [place] [address], [place], [place] [address]    Report Status PENDING  Incomplete  Resp Panel by RT-PCR (Flu A&B, Covid) Peripheral     Status: None   Collection Time: 09/22/21 12:24 PM   Specimen: Peripheral; Nasopharyngeal(NP) swabs in vial transport medium  Result Value Ref Range Status   SARS Coronavirus 2 by RT PCR NEGATIVE NEGATIVE Final    Comment: (NOTE) SARS-CoV-2 target nucleic acids are NOT DETECTED.  The SARS-CoV-2 RNA is generally detectable in upper respiratory specimens during the acute phase of infection. The lowest concentration of SARS-CoV-2 viral copies this assay can detect is 138 copies/mL. A negative result does not preclude SARS-Cov-2 infection and should not be used as the sole basis for treatment or other patient management decisions. A negative result may occur with  improper specimen collection/handling, submission of specimen other than nasopharyngeal swab, presence of viral mutation(s) within  the areas targeted by this assay, and inadequate number of viral copies(<138 copies/mL). A negative result must be combined with clinical observations, patient history, and epidemiological information. The expected result is Negative.  Fact Sheet for Patients:  EntrepreneurPulse.com.au  Fact Sheet for Healthcare Providers:  IncredibleEmployment.be  This test is no t yet approved or cleared by the [place] FDA and  has been authorized for detection and/or diagnosis of SARS-CoV-2 by FDA under an Emergency Use Authorization (EUA). This EUA will remain  in effect (meaning this test can be used) for the duration of the COVID-19 declaration under Section 564(b)(1) of the Act, 21 U.S.C.section 360bbb-3(b)(1), unless the authorization is terminated  or revoked sooner.       Influenza A by PCR NEGATIVE  NEGATIVE Final   Influenza B by PCR NEGATIVE NEGATIVE Final    Comment: (NOTE) The Xpert Xpress SARS-CoV-2/FLU/RSV plus assay is intended as an aid in the diagnosis of influenza from Nasopharyngeal swab specimens and should not be used as a sole basis for treatment. Nasal washings and aspirates are unacceptable for Xpert Xpress SARS-CoV-2/FLU/RSV testing.  Fact Sheet for Patients: EntrepreneurPulse.com.au  Fact Sheet for Healthcare Providers: IncredibleEmployment.be  This test is not yet approved or cleared by the [place] FDA and has been authorized for detection and/or diagnosis of SARS-CoV-2 by FDA under an Emergency Use Authorization (EUA). This EUA will remain in effect (meaning this test can be used) for the duration of the COVID-19 declaration under Section 564(b)(1) of the Act, 21 U.S.C. section 360bbb-3(b)(1), unless the authorization is terminated or revoked.  Performed at [facility], [address] [place], [place] [address]          Radiology Studies:    [facility]  Chest [facility] 1 View  Result Date: 09/22/2021 CLINICAL DATA:  Questionable sepsis. EXAM: PORTABLE CHEST 1 VIEW COMPARISON:  March 04, 2021 FINDINGS: The heart size and mediastinal contours are stable. Heart size is enlarged. Right central venous line is identified distal tip in the superior vena cava unchanged. Both lungs are clear. The visualized skeletal structures are stable. IMPRESSION: No active cardiopulmonary disease identified. Electronically Signed   By: Abelardo Diesel M.D.   On: 09/22/2021 13:01   DG Foot Complete Right  Result Date: 09/22/2021 CLINICAL DATA:  Diabetic foot ulcer of the distal fifth digit. EXAM: RIGHT FOOT COMPLETE - 3+ VIEW COMPARISON:  None. FINDINGS: There is no evidence of fracture or dislocation. No bony destruction is identified. There is soft tissue swelling of the right fifth digit near the proximal interphalangeal joint. IMPRESSION: No evidence of osteomyelitis. Soft tissue swelling of the right fifth digit. Electronically Signed   By: Abelardo Diesel M.D.   On: 09/22/2021 13:03        Medications:    Scheduled Meds:  apixaban  5 mg Oral BID   vitamin C  250 mg Oral BID   atorvastatin  40 mg Oral QHS   calcium-vitamin D  1 tablet Oral BID   Chlorhexidine Gluconate Cloth  6 each Topical Daily   [START ON 09/24/2021] COVID-19 mRNA bivalent vaccine (Pfizer)  0.3 mL Intramuscular ONCE-1600   feeding supplement  237 mL Oral TID BM   ferrous sulfate  325 mg Oral Q breakfast   FLUoxetine  40 mg Oral Daily   influenza vaccine adjuvanted  0.5 mL Intramuscular Tomorrow-1000   insulin aspart  0-5 Units Subcutaneous QHS   insulin aspart  0-9 Units Subcutaneous TID WC   multivitamin with minerals  1 tablet Oral Daily   pantoprazole  40 mg Oral Daily   potassium chloride SA  20 mEq Oral Daily   tamsulosin  0.8 mg Oral QHS   traZODone  100 mg Oral QHS   vitamin B-12  1,000 mcg Oral Daily   Continuous Infusions:  sodium chloride 100 mL/hr at 09/23/21 0539   ceFEPime  (MAXIPIME) IV 2 g (09/23/21 1544)   vancomycin 1,000 mg (09/23/21 1256)       LOS: 1 day    Time spent: 35-minute    Leslee Home, MD [facility]   To contact the attending provider between 7A-7P or the covering provider during after hours 7P-7A, please log into the web site [email or web address] and access using universal [place] password  for that web site. If you do not have the password, please call the hospital operator.  09/23/2021, 5:19 PM

## 2021-09-23 NOTE — Progress Notes (Addendum)
Initial Nutrition Assessment  DOCUMENTATION CODES:   Obesity unspecified  INTERVENTION:   -Ensure Plus PO TID, each provides 350 kcals and 13g protein (Ensure Enlive on unit as well, with 20g protein)  -Multivitamin with minerals daily  -250 mg Vitamin C BID  -PO encouraged  NUTRITION DIAGNOSIS:   Increased nutrient needs related to cancer and cancer related treatments, wound healing as evidenced by estimated needs.  GOAL:   Patient will meet greater than or equal to 90% of their needs  MONITOR:   PO intake, Supplement acceptance, Labs, Weight trends, I & O's, Skin  REASON FOR ASSESSMENT:   Malnutrition Screening Tool    ASSESSMENT:   75 y.o. male with medical history significant of diabetes, atrial fibrillation on chronic anticoagulation, pancreatic adenocarcinoma being followed by Dr. Donneta Romberg, chronic kidney disease, constipation, coronary artery disease, osteoarthritis, depression, GERD, hyperlipidemia and essential hypertension, peripheral neuropathy and peripheral vascular disease who woke up this morning with ulcer at the lateral aspect of his foot.  Patient reports not liking the hospital food. States he has been eating little at home too. Pt has been ordering 2 yogurts on his meal trays. Has been drinking Ensure Enlive, finished one this morning. Agreed to drink at least 3 of these a day along with yogurts. Denies any issues with chewing or swallowing. Reports some dry mouth.  Has been having hypoglycemia. Encouraged consistent intakes to help blood sugar.   Per weight records, pt has lost 18 lbs since 7/14 (6% wt loss x 3 months, insignificant for time frame).  Medications: Vitamin C, OSCAL w/ D, Ferrous sulfate, KLOR-CON, Vitamin B-12  Labs reviewed:  CBGs: 46-144 Low K  NUTRITION - FOCUSED PHYSICAL EXAM:  No depletions noted.  Diet Order:   Diet Order             Diet Carb Modified Fluid consistency: Thin; Room service appropriate? Yes  Diet  effective now                   EDUCATION NEEDS:   Education needs have been addressed  Skin:  Skin Assessment: Skin Integrity Issues: Skin Integrity Issues:: Other (Comment) Other: lesion on right lateral foot  Last BM:  10/10 -type 1  Height:   Ht Readings from Last 1 Encounters:  09/22/21 5\' 11"  (1.803 m)    Weight:   Wt Readings from Last 1 Encounters:  09/22/21 108.9 kg    BMI:  Body mass index is 33.47 kg/m.  Estimated Nutritional Needs:   Kcal:  2400-2600  Protein:  115-130g  Fluid:  2.4L/day  Clayton Bibles, MS, RD, LDN Inpatient Clinical Dietitian Contact information available via Amion

## 2021-09-23 NOTE — Progress Notes (Signed)
Pt declined nocturnal cpap tonight.  Pt advised that RT is available all night should he change his mind.  Cpap machine remains at bedside as needed.

## 2021-09-23 NOTE — Plan of Care (Signed)
  Problem: Clinical Measurements: Goal: Ability to avoid or minimize complications of infection will improve Outcome: Progressing   Problem: Skin Integrity: Goal: Skin integrity will improve Outcome: Progressing   Problem: Clinical Measurements: Goal: Will remain free from infection Outcome: Progressing

## 2021-09-23 NOTE — Progress Notes (Signed)
Inpatient Diabetes Program Recommendations  AACE/ADA: New Consensus Statement on Inpatient Glycemic Control (2015)  Target Ranges:  Prepandial:   less than 140 mg/dL      Peak postprandial:   less than 180 mg/dL (1-2 hours)      Critically ill patients:  140 - 180 mg/dL   Lab Results  Component Value Date   GLUCAP 46 (L) 09/23/2021   HGBA1C 8.2 (H) 09/23/2021    Review of Glycemic Control Results for [name], Bill LEWIS "AL" (MRN [id number]) as of 09/23/2021 10:12  Ref. Range 09/23/2021 07:57  Glucose-Capillary Latest Ref Range: 70 - 99 mg/dL 46 (L)   Diabetes history: DM 2 Outpatient Diabetes medications: Lantus 50 units Daily Current orders for Inpatient glycemic control:  Novolog 0-20 units tid + hs  Ensure Enlive bid between meals  Inpatient Diabetes Program Recommendations:    Hypoglycemia 46 this am - reduce Novolog Correction to 0-9 units tid + hs scale - wait until glucose trends increase consistently >180 before adding basal insulin  We will see spikes in Glucose after the Ensure Supplement due to the many carbohydrates in it.  Thanks,  Tama Headings RN, MSN, BC-ADM Inpatient Diabetes Coordinator Team Pager [phone] (8a-5p)

## 2021-09-24 ENCOUNTER — Inpatient Hospital Stay (HOSPITAL_COMMUNITY): Payer: Medicare Other

## 2021-09-24 ENCOUNTER — Encounter: Payer: Self-pay | Admitting: Oncology

## 2021-09-24 DIAGNOSIS — M7989 Other specified soft tissue disorders: Secondary | ICD-10-CM

## 2021-09-24 LAB — CBC WITH DIFFERENTIAL/PLATELET
Abs Immature Granulocytes: 0.05 10*3/uL (ref 0.00–0.07)
Basophils Absolute: 0 10*3/uL (ref 0.0–0.1)
Basophils Relative: 1 %
Eosinophils Absolute: 0.1 10*3/uL (ref 0.0–0.5)
Eosinophils Relative: 2 %
HCT: 30.5 % — ABNORMAL LOW (ref 39.0–52.0)
Hemoglobin: 9.8 g/dL — ABNORMAL LOW (ref 13.0–17.0)
Immature Granulocytes: 1 %
Lymphocytes Relative: 47 %
Lymphs Abs: 2.4 10*3/uL (ref 0.7–4.0)
MCH: 27.9 pg (ref 26.0–34.0)
MCHC: 32.1 g/dL (ref 30.0–36.0)
MCV: 86.9 fL (ref 80.0–100.0)
Monocytes Absolute: 0.2 10*3/uL (ref 0.1–1.0)
Monocytes Relative: 4 %
Neutro Abs: 2.2 10*3/uL (ref 1.7–7.7)
Neutrophils Relative %: 45 %
Platelets: 182 10*3/uL (ref 150–400)
RBC: 3.51 MIL/uL — ABNORMAL LOW (ref 4.22–5.81)
RDW: 21.3 % — ABNORMAL HIGH (ref 11.5–15.5)
WBC: 5 10*3/uL (ref 4.0–10.5)
nRBC: 0 % (ref 0.0–0.2)

## 2021-09-24 LAB — BASIC METABOLIC PANEL
Anion gap: 7 (ref 5–15)
BUN: 12 mg/dL (ref 8–23)
CO2: 23 mmol/L (ref 22–32)
Calcium: 8.2 mg/dL — ABNORMAL LOW (ref 8.9–10.3)
Chloride: 107 mmol/L (ref 98–111)
Creatinine, Ser: 0.79 mg/dL (ref 0.61–1.24)
GFR, Estimated: >60 mL/min (ref >60)
Glucose, Bld: 87 mg/dL (ref 70–99)
Potassium: 3.6 mmol/L (ref 3.5–5.1)
Sodium: 137 mmol/L (ref 135–145)

## 2021-09-24 LAB — GLUCOSE, CAPILLARY
Glucose-Capillary: 71 mg/dL (ref 70–99)
Glucose-Capillary: 242 mg/dL — ABNORMAL HIGH (ref 70–99)
Glucose-Capillary: 252 mg/dL — ABNORMAL HIGH (ref 70–99)
Glucose-Capillary: 162 mg/dL — ABNORMAL HIGH (ref 70–99)

## 2021-09-24 MED ORDER — BISACODYL 5 MG PO TBEC
5.0000 mg | DELAYED_RELEASE_TABLET | Freq: Once | ORAL | Status: AC
  Administered 2021-09-24: 5 mg via ORAL
  Filled 2021-09-24: qty 1

## 2021-09-24 NOTE — Progress Notes (Addendum)
Progress Note:    [name]    XAJ:[id number] DOB: Mar 09, 1946 DOA: 09/22/2021  PCP: [name], [name] G, MD    Brief Narrative:   75 year old male with a past medical history of diabetes mellitus, A. fib on chronic anticoagulation, pancreatic adenocarcinoma, chronic kidney disease, coronary artery disease, osteoarthritis, depression, GERD, hyperlipidemia, peripheral neuropathy, peripheral vascular disease, peripheral neuropathy.  Patient presented to the emergency department after waking up and finding an ulcer on the lateral aspect of his right foot.  Was noted to have some drainage. Endorsed fevers and chills on Fri and Sat at home.     Subjective:   No acute events overnight.  Patient seen and examined this morning.  Right foot wrapped in Kerlix.  I No fevers or chills overnight. Currently getting chemotherapy for his pancreatic adenocarcinoma. Daughter present at bedside upon my evaluation.The patient would prefer to get wound care via home health RN upon discharge.    Assessment and Plan:   Cellulitis and diabetic foot wound.  Continue vancomycin and cefepime.  Wound culture pending results. MRI showed no osteomyelitis but did show myofasciitis and cellulitis. Will consult Ortho for recommendations. Wound care nurse consulted and following recommendations. Obtain ABI.  Right leg swelling: Duplex US negative for DVT. Unclear etiology. Patient states it is chronic.  A. fib: Rate controlled.  Continue home apixaban  Pancreatic adenocarcinoma: No acute treatment  Coronary artery disease: No acute treatment  Hypertension: Stable.  Does not appear to be on any home medications.  Insulin-dependent diabetes mellitus: Suboptimally controlled. A1C 8.2.  Continue SSI.   Other information:    DVT prophylaxis: Apixaban Code Status: Full Family Communication: None present at bedside Disposition:   Status is: Inpatient  Remains inpatient appropriate  because:Ongoing diagnostic testing needed not appropriate for outpatient work up  Dispo: The patient is from: Home              Anticipated d/c is to: Home              Patient currently is not medically stable to d/c.   Difficult to place patient: No     Consultants:   Ortho    Objective:    Vitals:   09/23/21 1346 09/23/21 2214 09/24/21 0508 09/24/21 1418  BP: (!) 170/80 (!) 141/54 132/61 (!) 150/90  Pulse: 85 98 86 88  Resp: 16 20 17 16   Temp: 97.8 F (36.6 C) 98.3 F (36.8 C) 98.4 F (36.9 C) 98.6 F (37 C)  TempSrc: Oral Oral Oral Oral  SpO2: 98% 91% 95% 100%  Weight:      Height:        Intake/Output Summary (Last 24 hours) at 09/24/2021 1529 Last data filed at 09/24/2021 1419 Gross per 24 hour  Intake 2393.66 ml  Output --  Net 2393.66 ml    Filed Weights   09/22/21 1148  Weight: 108.9 kg       Physical Exam:    General exam: Appears calm and comfortable  Respiratory system: Clear to auscultation. Respiratory effort normal. Cardiovascular system: S1 & S2 heard, RRR. No JVD, murmurs, rubs, gallops or clicks.  Plus pitting edema right lower extremity which the patient states is chronic.  No edema on the left lower extremity Gastrointestinal system: Abdomen is nondistended, soft and nontender. No organomegaly or masses felt. Normal bowel sounds heard. Central nervous system: Alert and oriented. No focal neurological deficits. Extremities: Symmetric 5 x 5 power. Skin: Right foot wrapped in Kerlix Psychiatry:  Judgement and insight appear normal. Mood & affect appropriate.     Data Reviewed:    I have personally reviewed following labs and imaging studies  CBC: Recent Labs  Lab 09/18/21 0920 09/22/21 1157 09/23/21 0518 09/24/21 0520  WBC 10.5 7.4 7.9 5.0  NEUTROABS 6.5 6.3  --  2.2  HGB 10.8* 9.8* 9.1* 9.8*  HCT 34.0* 31.0* 29.2* 30.5*  MCV 85.6 84.9 87.2 86.9  PLT 265 216 199 182     Basic Metabolic Panel: Recent Labs  Lab  09/18/21 0920 09/22/21 1157 09/23/21 0518 09/24/21 0520  NA 138 135 136 137  K 3.6 3.8 3.3* 3.6  CL 103 104 106 107  CO2 26 23 25 23   GLUCOSE 201* 153* 50* 87  BUN 15 19 15 12   CREATININE 0.99 0.82 0.80 0.79  CALCIUM 9.2 8.1* 8.0* 8.2*     GFR: Estimated Creatinine Clearance: 98.6 mL/min (by C-G formula based on SCr of 0.79 mg/dL).  Liver Function Tests: Recent Labs  Lab 09/18/21 0920 09/22/21 1157 09/23/21 0518  AST 23 42* 36  ALT 17 32 29  ALKPHOS 110 71 76  BILITOT 0.4 0.6 0.7  PROT 6.7 5.8* 5.5*  ALBUMIN 3.5 3.0* 2.7*     CBG: Recent Labs  Lab 09/23/21 1109 09/23/21 1548 09/23/21 2215 09/24/21 0811 09/24/21 1142  GLUCAP 144* 222* 109* 71 242*      Recent Results (from the past 240 hour(s))  Blood culture (routine single)     Status: None (Preliminary result)   Collection Time: 09/22/21 12:20 PM   Specimen: BLOOD  Result Value Ref Range Status   Specimen Description   Final    BLOOD RIGHT PORTA CATH Performed at [facility], [address], [place], [place] [address]    Special Requests   Final    BOTTLES DRAWN AEROBIC AND ANAEROBIC Blood Culture adequate volume Performed at [facility], [address] [place], [place] [address]    Culture   Final    NO GROWTH 2 DAYS Performed at [facility], [place] [address], [place], [place] [address]    Report Status PENDING  Incomplete  Resp Panel by RT-PCR (Flu A&B, Covid) Peripheral     Status: None   Collection Time: 09/22/21 12:24 PM   Specimen: Peripheral; Nasopharyngeal(NP) swabs in vial transport medium  Result Value Ref Range Status   SARS Coronavirus 2 by RT PCR NEGATIVE NEGATIVE Final    Comment: (NOTE) SARS-CoV-2 target nucleic acids are NOT DETECTED.  The SARS-CoV-2 RNA is generally detectable in upper respiratory specimens during the acute phase of infection. The lowest concentration of SARS-CoV-2 viral copies this assay can detect is 138  copies/mL. A negative result does not preclude SARS-Cov-2 infection and should not be used as the sole basis for treatment or other patient management decisions. A negative result may occur with  improper specimen collection/handling, submission of specimen other than nasopharyngeal swab, presence of viral mutation(s) within the areas targeted by this assay, and inadequate number of viral copies(<138 copies/mL). A negative result must be combined with clinical observations, patient history, and epidemiological information. The expected result is Negative.  Fact Sheet for Patients:  EntrepreneurPulse.com.au  Fact Sheet for Healthcare Providers:  IncredibleEmployment.be  This test is no t yet approved or cleared by the [place] FDA and  has been authorized for detection and/or diagnosis of SARS-CoV-2 by FDA under an Emergency Use Authorization (EUA). This EUA will remain  in effect (meaning this  test can be used) for the duration of the COVID-19 declaration under Section 564(b)(1) of the Act, 21 U.S.C.section 360bbb-3(b)(1), unless the authorization is terminated  or revoked sooner.       Influenza A by PCR NEGATIVE NEGATIVE Final   Influenza B by PCR NEGATIVE NEGATIVE Final    Comment: (NOTE) The Xpert Xpress SARS-CoV-2/FLU/RSV plus assay is intended as an aid in the diagnosis of influenza from Nasopharyngeal swab specimens and should not be used as a sole basis for treatment. Nasal washings and aspirates are unacceptable for Xpert Xpress SARS-CoV-2/FLU/RSV testing.  Fact Sheet for Patients: EntrepreneurPulse.com.au  Fact Sheet for Healthcare Providers: IncredibleEmployment.be  This test is not yet approved or cleared by the [place] FDA and has been authorized for detection and/or diagnosis of SARS-CoV-2 by FDA under an Emergency Use Authorization (EUA). This EUA will remain in effect (meaning  this test can be used) for the duration of the COVID-19 declaration under Section 564(b)(1) of the Act, 21 U.S.C. section 360bbb-3(b)(1), unless the authorization is terminated or revoked.  Performed at [facility], [address] [place], [place] [address]           Radiology Studies:    MR FOOT RIGHT WO CONTRAST  Result Date: 09/24/2021 CLINICAL DATA:  Diabetic foot ulcer. EXAM: MRI OF THE RIGHT FOREFOOT WITHOUT CONTRAST TECHNIQUE: Multiplanar, multisequence MR imaging of the right foot was performed. No intravenous contrast was administered. COMPARISON:  Radiographs 09/22/2021 FINDINGS: Very limited examination due to patient motion. There is an open wound noted along the lateral aspect of the forefoot at the level of the fifth MTP joint. There is some fluid surrounding the fifth metatarsal head. I do not however see any definite MR findings for septic arthritis or osteomyelitis. There is diffuse cellulitis and myofasciitis. IMPRESSION: 1. Very limited examination due to patient motion. 2. Open wound along the lateral aspect of the forefoot at the level of the fifth MTP joint. There is some fluid surrounding the fifth metatarsal head but I do not see any definite MR findings for septic arthritis or osteomyelitis. 3. Diffuse cellulitis and myofasciitis. Electronically Signed   By: Marijo Sanes M.D.   On: 09/24/2021 08:14   VAS [place] LOWER EXTREMITY VENOUS (DVT)  Result Date: 09/24/2021  Lower Venous DVT Study Patient Name:  SRIYAN CUTTING [facility]  Date of Exam:   09/24/2021 Medical Rec #: [id number]              Accession #:    [id number] Date of Birth: Nov 27, 1946             Patient Gender: M Patient Age:   12 years Exam Location:  [facility] Procedure:      VAS [place] LOWER EXTREMITY VENOUS (DVT) Referring Phys: Imagene Sheller --------------------------------------------------------------------------------  Indications: Swelling.  Risk Factors: None identified.  Limitations: Poor ultrasound/tissue interface. Comparison Study: No prior studies. Performing Technologist: Oliver Hum RVT  Examination Guidelines: A complete evaluation includes B-mode imaging, spectral Doppler, color Doppler, and power Doppler as needed of all accessible portions of each vessel. Bilateral testing is considered an integral part of a complete examination. Limited examinations for reoccurring indications may be performed as noted. The reflux portion of the exam is performed with the patient in reverse Trendelenburg.  +---------+---------------+---------+-----------+----------+--------------+ RIGHT    CompressibilityPhasicitySpontaneityPropertiesThrombus Aging +---------+---------------+---------+-----------+----------+--------------+ CFV      Full           Yes      Yes                                 +---------+---------------+---------+-----------+----------+--------------+  SFJ      Full                                                        +---------+---------------+---------+-----------+----------+--------------+ FV Prox  Full                                                        +---------+---------------+---------+-----------+----------+--------------+ FV Mid   Full                                                        +---------+---------------+---------+-----------+----------+--------------+ FV DistalFull                                                        +---------+---------------+---------+-----------+----------+--------------+ PFV      Full                                                        +---------+---------------+---------+-----------+----------+--------------+ POP      Full           Yes      Yes                                 +---------+---------------+---------+-----------+----------+--------------+ PTV      Full                                                         +---------+---------------+---------+-----------+----------+--------------+ PERO     Full                                                        +---------+---------------+---------+-----------+----------+--------------+   +----+---------------+---------+-----------+----------+--------------+ LEFTCompressibilityPhasicitySpontaneityPropertiesThrombus Aging +----+---------------+---------+-----------+----------+--------------+ CFV Full           Yes      Yes                                 +----+---------------+---------+-----------+----------+--------------+    Summary: RIGHT: - There is no evidence of deep vein thrombosis in the lower extremity.  - No cystic structure found in the popliteal fossa.  LEFT: - No evidence of common femoral vein obstruction.  *See table(s) above for measurements and observations. Electronically signed by Servando Snare MD on 09/24/2021  at 1:50:03 PM.    Final         Medications:    Scheduled Meds:  apixaban  5 mg Oral BID   vitamin C  250 mg Oral BID   atorvastatin  40 mg Oral QHS   calcium-vitamin D  1 tablet Oral BID   Chlorhexidine Gluconate Cloth  6 each Topical Daily   COVID-19 mRNA bivalent vaccine (Pfizer)  0.3 mL Intramuscular ONCE-1600   feeding supplement  237 mL Oral TID BM   ferrous sulfate  325 mg Oral Q breakfast   FLUoxetine  40 mg Oral Daily   influenza vaccine adjuvanted  0.5 mL Intramuscular Tomorrow-1000   insulin aspart  0-5 Units Subcutaneous QHS   insulin aspart  0-9 Units Subcutaneous TID WC   multivitamin with minerals  1 tablet Oral Daily   pantoprazole  40 mg Oral Daily   potassium chloride SA  20 mEq Oral Daily   tamsulosin  0.8 mg Oral QHS   traZODone  100 mg Oral QHS   vitamin B-12  1,000 mcg Oral Daily   Continuous Infusions:  sodium chloride 100 mL/hr at 09/24/21 0931   ceFEPime (MAXIPIME) IV 2 g (09/24/21 1411)   vancomycin 1,000 mg (09/24/21 1257)       LOS: 2 days    Time spent:  35-minute    Leslee Home, MD [facility]   To contact the attending provider between 7A-7P or the covering provider during after hours 7P-7A, please log into the web site [email or web address] and access using universal [place] password for that web site. If you do not have the password, please call the hospital operator.  09/24/2021, 3:29 PM

## 2021-09-24 NOTE — Progress Notes (Signed)
Right lower extremity venous duplex has been completed. Preliminary results can be found in CV Proc through chart review.   09/24/21 10:33 AM Carlos Levering RVT

## 2021-09-25 ENCOUNTER — Other Ambulatory Visit: Payer: Medicare Other

## 2021-09-25 ENCOUNTER — Ambulatory Visit: Payer: Medicare Other | Admitting: Oncology

## 2021-09-25 ENCOUNTER — Inpatient Hospital Stay (HOSPITAL_COMMUNITY): Payer: Medicare Other

## 2021-09-25 ENCOUNTER — Ambulatory Visit: Payer: Medicare Other

## 2021-09-25 DIAGNOSIS — I482 Chronic atrial fibrillation, unspecified: Secondary | ICD-10-CM

## 2021-09-25 DIAGNOSIS — L03115 Cellulitis of right lower limb: Secondary | ICD-10-CM

## 2021-09-25 DIAGNOSIS — L0390 Cellulitis, unspecified: Secondary | ICD-10-CM

## 2021-09-25 DIAGNOSIS — C259 Malignant neoplasm of pancreas, unspecified: Secondary | ICD-10-CM

## 2021-09-25 LAB — BASIC METABOLIC PANEL
Anion gap: 6 (ref 5–15)
BUN: 12 mg/dL (ref 8–23)
CO2: 23 mmol/L (ref 22–32)
Calcium: 8.2 mg/dL — ABNORMAL LOW (ref 8.9–10.3)
Chloride: 109 mmol/L (ref 98–111)
Creatinine, Ser: 0.59 mg/dL — ABNORMAL LOW (ref 0.61–1.24)
GFR, Estimated: >60 mL/min (ref >60)
Glucose, Bld: 180 mg/dL — ABNORMAL HIGH (ref 70–99)
Potassium: 3.9 mmol/L (ref 3.5–5.1)
Sodium: 138 mmol/L (ref 135–145)

## 2021-09-25 LAB — CBC
HCT: 28.2 % — ABNORMAL LOW (ref 39.0–52.0)
Hemoglobin: 8.8 g/dL — ABNORMAL LOW (ref 13.0–17.0)
MCH: 27.3 pg (ref 26.0–34.0)
MCHC: 31.2 g/dL (ref 30.0–36.0)
MCV: 87.6 fL (ref 80.0–100.0)
Platelets: 146 10*3/uL — ABNORMAL LOW (ref 150–400)
RBC: 3.22 MIL/uL — ABNORMAL LOW (ref 4.22–5.81)
RDW: 21.2 % — ABNORMAL HIGH (ref 11.5–15.5)
WBC: 5.4 10*3/uL (ref 4.0–10.5)
nRBC: 0 % (ref 0.0–0.2)

## 2021-09-25 LAB — GLUCOSE, CAPILLARY
Glucose-Capillary: 137 mg/dL — ABNORMAL HIGH (ref 70–99)
Glucose-Capillary: 185 mg/dL — ABNORMAL HIGH (ref 70–99)
Glucose-Capillary: 225 mg/dL — ABNORMAL HIGH (ref 70–99)
Glucose-Capillary: 181 mg/dL — ABNORMAL HIGH (ref 70–99)

## 2021-09-25 NOTE — Progress Notes (Signed)
ABI's have been completed. Preliminary results can be found in CV Proc through chart review.   09/25/21 8:50 AM [name] RVT

## 2021-09-25 NOTE — Progress Notes (Signed)
Patient ID: [name], male   DOB: 08-10-1946, 75 y.o.   MRN: [id number]  PROGRESS NOTE    Erikson Danzy Bunker  FHL:[id number] DOB: 1945-12-17 DOA: 09/22/2021 PCP: [name], [name] G, MD   Brief Narrative:  75 year old male with history of diabetes mellitus type 2, A. fib on chronic anticoagulation, pancreatic adenocarcinoma, CAD, osteoarthritis, depression, GERD, hyperlipidemia, essential hypertension, peripheral neuropathy and peripheral vascular disease presented with right foot pain and wound with some drainage.  He was admitted for right foot cellulitis and started on IV antibiotics.  MRI showed no osteomyelitis but did show mild fasciitis and cellulitis.  Orthopedics was consulted.  Assessment & Plan:   Right foot cellulitis with right fifth metatarsal ulcer: Present on admission - MRI showed no osteomyelitis but did show mild fasciitis and cellulitis. -Orthopedics recommends conservative management and outpatient follow-up with orthopedics -Currently on broad-spectrum antibiotics.  DC IV fluids.  Hemodynamically stable. -Right lower extremity duplex ultrasound negative for DVT  Chronic A. Fib -Currently rate controlled.  Continue Eliquis.  Outpatient follow-up with cardiology  Hyperlipidemia -Continue statin  Diabetes mellitus type 2 with hyperglycemia -Continue CBGs with SSI.  A1c 8.2.  BPH -Continue tamsulosin  Anxiety/depression --continue fluoxetine  History of pancreatic adenocarcinoma -Outpatient follow-up with oncology/Dr. Benay Spice  Coronary artery disease -Currently stable.  Hypertension -Blood pressure stable.  Not on any antihypertensives at home.   DVT prophylaxis: Eliquis Code Status: Full Family Communication: None at bedside Disposition Plan: Status is: Inpatient  Remains inpatient appropriate because:Inpatient level of care appropriate due to severity of illness  Dispo: The patient is from: Home              Anticipated d/c is to:  Home              Patient currently is not medically stable to d/c.   Difficult to place patient No  Consultants: Orthopedics  Procedures: None  Antimicrobials:  Anti-infectives (From admission, onward)    Start     Dose/Rate Route Frequency Ordered Stop   09/23/21 0100  vancomycin (VANCOCIN) IVPB 1000 mg/200 mL premix        1,000 mg 200 mL/hr over 60 Minutes Intravenous Every 12 hours 09/22/21 1942     09/22/21 2100  ceFEPIme (MAXIPIME) 2 g in sodium chloride 0.9 % 100 mL IVPB        2 g 200 mL/hr over 30 Minutes Intravenous Every 8 hours 09/22/21 1942     09/22/21 1230  vancomycin (VANCOCIN) IVPB 1000 mg/200 mL premix        1,000 mg 200 mL/hr over 60 Minutes Intravenous  Once 09/22/21 1215 09/22/21 1411         Subjective: Patient seen and examined at bedside.  He feels that his right foot swelling and redness are improving but still present.  Denies worsening fever, chest pain, shortness of breath.  Objective: Vitals:   09/24/21 2056 09/24/21 2122 09/24/21 2131 09/25/21 0447  BP:  (!) 178/85 (!) 173/85 (!) 141/66  Pulse: 91   89  Resp: 20   16  Temp:    97.8 F (36.6 C)  TempSrc:    Oral  SpO2: 95%   94%  Weight:      Height:        Intake/Output Summary (Last 24 hours) at 09/25/2021 1104 Last data filed at 09/25/2021 0700 Gross per 24 hour  Intake 3765.06 ml  Output --  Net 3765.06 ml   Filed Weights   09/22/21 1148  Weight: 108.9 kg    Examination:  General exam: Appears calm and comfortable.  Currently on room air. Respiratory system: Bilateral decreased breath sounds at bases with some scattered crackles Cardiovascular system: S1 & S2 heard, Rate controlled Gastrointestinal system: Abdomen is obese, distended slightly.  Soft and nontender.  Bowel sounds are heard  extremities: No cyanosis, clubbing; right lower extremity edema present Central nervous system: Alert and oriented. No focal neurological deficits. Moving extremities Skin: Right foot  is erythematous, tender and swollen with a blister on the lateral aspect of the right foot  psychiatry: Mood, affect and judgment are normal   Data Reviewed: I have personally reviewed following labs and imaging studies  CBC: Recent Labs  Lab 09/22/21 1157 09/23/21 0518 09/24/21 0520 09/25/21 0354  WBC 7.4 7.9 5.0 5.4  NEUTROABS 6.3  --  2.2  --   HGB 9.8* 9.1* 9.8* 8.8*  HCT 31.0* 29.2* 30.5* 28.2*  MCV 84.9 87.2 86.9 87.6  PLT 216 199 182 379*   Basic Metabolic Panel: Recent Labs  Lab 09/22/21 1157 09/23/21 0518 09/24/21 0520 09/25/21 0354  NA 135 136 137 138  K 3.8 3.3* 3.6 3.9  CL 104 106 107 109  CO2 23 25 23 23   GLUCOSE 153* 50* 87 180*  BUN 19 15 12 12   CREATININE 0.82 0.80 0.79 0.59*  CALCIUM 8.1* 8.0* 8.2* 8.2*   GFR: Estimated Creatinine Clearance: 98.6 mL/min (A) (by C-G formula based on SCr of 0.59 mg/dL (L)). Liver Function Tests: Recent Labs  Lab 09/22/21 1157 09/23/21 0518  AST 42* 36  ALT 32 29  ALKPHOS 71 76  BILITOT 0.6 0.7  PROT 5.8* 5.5*  ALBUMIN 3.0* 2.7*   No results for input(s): LIPASE, AMYLASE in the last 168 hours. No results for input(s): AMMONIA in the last 168 hours. Coagulation Profile: Recent Labs  Lab 09/22/21 1224  INR 1.8*   Cardiac Enzymes: No results for input(s): CKTOTAL, CKMB, CKMBINDEX, TROPONINI in the last 168 hours. BNP (last 3 results) No results for input(s): PROBNP in the last 8760 hours. HbA1C: Recent Labs    09/23/21 0518  HGBA1C 8.2*   CBG: Recent Labs  Lab 09/24/21 0811 09/24/21 1142 09/24/21 1639 09/24/21 2021 09/25/21 0726  GLUCAP 71 242* 252* 162* 137*   Lipid Profile: No results for input(s): CHOL, HDL, LDLCALC, TRIG, CHOLHDL, LDLDIRECT in the last 72 hours. Thyroid Function Tests: No results for input(s): TSH, T4TOTAL, FREET4, T3FREE, THYROIDAB in the last 72 hours. Anemia Panel: No results for input(s): VITAMINB12, FOLATE, FERRITIN, TIBC, IRON, RETICCTPCT in the last 72  hours. Sepsis Labs: Recent Labs  Lab 09/22/21 1224 09/22/21 1759  LATICACIDVEN 1.1 1.2    Recent Results (from the past 240 hour(s))  Blood culture (routine single)     Status: None (Preliminary result)   Collection Time: 09/22/21 12:20 PM   Specimen: BLOOD  Result Value Ref Range Status   Specimen Description   Final    BLOOD RIGHT PORTA CATH Performed at [facility], [address] [place], [place] [address]    Special Requests   Final    BOTTLES DRAWN AEROBIC AND ANAEROBIC Blood Culture adequate volume Performed at [facility], [address], [place], [place] [address]    Culture   Final    NO GROWTH 3 DAYS Performed at [facility], [place] [address] [place], [place] [address]    Report Status PENDING  Incomplete  Resp Panel by RT-PCR (Flu A&B, Covid)  Peripheral     Status: None   Collection Time: 09/22/21 12:24 PM   Specimen: Peripheral; Nasopharyngeal(NP) swabs in vial transport medium  Result Value Ref Range Status   SARS Coronavirus 2 by RT PCR NEGATIVE NEGATIVE Final    Comment: (NOTE) SARS-CoV-2 target nucleic acids are NOT DETECTED.  The SARS-CoV-2 RNA is generally detectable in upper respiratory specimens during the acute phase of infection. The lowest concentration of SARS-CoV-2 viral copies this assay can detect is 138 copies/mL. A negative result does not preclude SARS-Cov-2 infection and should not be used as the sole basis for treatment or other patient management decisions. A negative result may occur with  improper specimen collection/handling, submission of specimen other than nasopharyngeal swab, presence of viral mutation(s) within the areas targeted by this assay, and inadequate number of viral copies(<138 copies/mL). A negative result must be combined with clinical observations, patient history, and epidemiological information. The expected result is Negative.  Fact Sheet for Patients:   EntrepreneurPulse.com.au  Fact Sheet for Healthcare Providers:  IncredibleEmployment.be  This test is no t yet approved or cleared by the [place] FDA and  has been authorized for detection and/or diagnosis of SARS-CoV-2 by FDA under an Emergency Use Authorization (EUA). This EUA will remain  in effect (meaning this test can be used) for the duration of the COVID-19 declaration under Section 564(b)(1) of the Act, 21 U.S.C.section 360bbb-3(b)(1), unless the authorization is terminated  or revoked sooner.       Influenza A by PCR NEGATIVE NEGATIVE Final   Influenza B by PCR NEGATIVE NEGATIVE Final    Comment: (NOTE) The Xpert Xpress SARS-CoV-2/FLU/RSV plus assay is intended as an aid in the diagnosis of influenza from Nasopharyngeal swab specimens and should not be used as a sole basis for treatment. Nasal washings and aspirates are unacceptable for Xpert Xpress SARS-CoV-2/FLU/RSV testing.  Fact Sheet for Patients: EntrepreneurPulse.com.au  Fact Sheet for Healthcare Providers: IncredibleEmployment.be  This test is not yet approved or cleared by the [place] FDA and has been authorized for detection and/or diagnosis of SARS-CoV-2 by FDA under an Emergency Use Authorization (EUA). This EUA will remain in effect (meaning this test can be used) for the duration of the COVID-19 declaration under Section 564(b)(1) of the Act, 21 U.S.C. section 360bbb-3(b)(1), unless the authorization is terminated or revoked.  Performed at [facility], [address], [place], [place] [address]          Radiology Studies: MR FOOT RIGHT WO CONTRAST  Result Date: 09/24/2021 CLINICAL DATA:  Diabetic foot ulcer. EXAM: MRI OF THE RIGHT FOREFOOT WITHOUT CONTRAST TECHNIQUE: Multiplanar, multisequence MR imaging of the right foot was performed. No intravenous contrast was administered. COMPARISON:   Radiographs 09/22/2021 FINDINGS: Very limited examination due to patient motion. There is an open wound noted along the lateral aspect of the forefoot at the level of the fifth MTP joint. There is some fluid surrounding the fifth metatarsal head. I do not however see any definite MR findings for septic arthritis or osteomyelitis. There is diffuse cellulitis and myofasciitis. IMPRESSION: 1. Very limited examination due to patient motion. 2. Open wound along the lateral aspect of the forefoot at the level of the fifth MTP joint. There is some fluid surrounding the fifth metatarsal head but I do not see any definite MR findings for septic arthritis or osteomyelitis. 3. Diffuse cellulitis and myofasciitis. Electronically Signed   By: Marijo Sanes M.D.   On: 09/24/2021 08:14   VAS [place] ABI  WITH/WO TBI  Result Date: 09/25/2021  LOWER EXTREMITY DOPPLER STUDY Patient Name:  Maximillian Habibi  Date of Exam:   09/25/2021 Medical Rec #: [id number]              Accession #:    [id number] Date of Birth: 05-Jul-1946             Patient Gender: M Patient Age:   30 years Exam Location:  [facility] Procedure:      VAS [place] ABI WITH/WO TBI Referring Phys: Imagene Sheller --------------------------------------------------------------------------------  Indications: Ulceration. High Risk Factors: Hypertension, Diabetes.  Comparison Study: No prior studies. Performing Technologist: Carlos Levering RVT  Examination Guidelines: A complete evaluation includes at minimum, Doppler waveform signals and systolic blood pressure reading at the level of bilateral brachial, anterior tibial, and posterior tibial arteries, when vessel segments are accessible. Bilateral testing is considered an integral part of a complete examination. Photoelectric Plethysmograph (PPG) waveforms and toe systolic pressure readings are included as required and additional duplex testing as needed. Limited examinations for reoccurring indications may be  performed as noted.  ABI Findings: +---------+------------------+-----+---------+--------+ Right    Rt Pressure (mmHg)IndexWaveform Comment  +---------+------------------+-----+---------+--------+ Brachial 153                    triphasic         +---------+------------------+-----+---------+--------+ PTA      222               1.34 biphasic          +---------+------------------+-----+---------+--------+ DP       204               1.23 biphasic          +---------+------------------+-----+---------+--------+ Donalee Citrin               0.69                   +---------+------------------+-----+---------+--------+ +---------+------------------+-----+---------+-------+ Left     Lt Pressure (mmHg)IndexWaveform Comment +---------+------------------+-----+---------+-------+ Brachial 166                    triphasic        +---------+------------------+-----+---------+-------+ PTA      250               1.51 triphasic        +---------+------------------+-----+---------+-------+ DP       241               1.45 triphasic        +---------+------------------+-----+---------+-------+ Great Toe146               0.88                  +---------+------------------+-----+---------+-------+ +-------+-----------+-----------+------------+------------+ ABI/TBIToday's ABIToday's TBIPrevious ABIPrevious TBI +-------+-----------+-----------+------------+------------+ Right  1.34       0.69                                +-------+-----------+-----------+------------+------------+ Left   1.51       0.88                                +-------+-----------+-----------+------------+------------+  Summary: Right: Resting right ankle-brachial index indicates noncompressible right lower extremity arteries. The right toe-brachial index is abnormal. Biphasic waveforms are noted in the dorsalis pedis and posterior tibial arteries. Left: Resting  left ankle-brachial index  indicates noncompressible left lower extremity arteries. The left toe-brachial index is normal. Triphasic waveforms are noted in the dorsalis pedis and posterior tibial arteries.  *See table(s) above for measurements and observations.     Preliminary    VAS [place] LOWER EXTREMITY VENOUS (DVT)  Result Date: 09/24/2021  Lower Venous DVT Study Patient Name:  BRINDEN KINCHELOE [facility]  Date of Exam:   09/24/2021 Medical Rec #: [id number]              Accession #:    [id number] Date of Birth: [birth year]             Patient Gender: M Patient Age:   56 years Exam Location:  [facility] Procedure:      VAS [place] LOWER EXTREMITY VENOUS (DVT) Referring Phys: Imagene Sheller --------------------------------------------------------------------------------  Indications: Swelling.  Risk Factors: None identified. Limitations: Poor ultrasound/tissue interface. Comparison Study: No prior studies. Performing Technologist: Oliver Hum RVT  Examination Guidelines: A complete evaluation includes B-mode imaging, spectral Doppler, color Doppler, and power Doppler as needed of all accessible portions of each vessel. Bilateral testing is considered an integral part of a complete examination. Limited examinations for reoccurring indications may be performed as noted. The reflux portion of the exam is performed with the patient in reverse Trendelenburg.  +---------+---------------+---------+-----------+----------+--------------+ RIGHT    CompressibilityPhasicitySpontaneityPropertiesThrombus Aging +---------+---------------+---------+-----------+----------+--------------+ CFV      Full           Yes      Yes                                 +---------+---------------+---------+-----------+----------+--------------+ SFJ      Full                                                        +---------+---------------+---------+-----------+----------+--------------+ FV Prox  Full                                                         +---------+---------------+---------+-----------+----------+--------------+ FV Mid   Full                                                        +---------+---------------+---------+-----------+----------+--------------+ FV DistalFull                                                        +---------+---------------+---------+-----------+----------+--------------+ PFV      Full                                                        +---------+---------------+---------+-----------+----------+--------------+ POP  Full           Yes      Yes                                 +---------+---------------+---------+-----------+----------+--------------+ PTV      Full                                                        +---------+---------------+---------+-----------+----------+--------------+ PERO     Full                                                        +---------+---------------+---------+-----------+----------+--------------+   +----+---------------+---------+-----------+----------+--------------+ LEFTCompressibilityPhasicitySpontaneityPropertiesThrombus Aging +----+---------------+---------+-----------+----------+--------------+ CFV Full           Yes      Yes                                 +----+---------------+---------+-----------+----------+--------------+    Summary: RIGHT: - There is no evidence of deep vein thrombosis in the lower extremity.  - No cystic structure found in the popliteal fossa.  LEFT: - No evidence of common femoral vein obstruction.  *See table(s) above for measurements and observations. Electronically signed by Servando Snare MD on 09/24/2021 at 1:50:03 PM.    Final         Scheduled Meds:  apixaban  5 mg Oral BID   vitamin C  250 mg Oral BID   atorvastatin  40 mg Oral QHS   calcium-vitamin D  1 tablet Oral BID   Chlorhexidine Gluconate Cloth  6 each Topical Daily   feeding supplement  237 mL Oral TID BM    ferrous sulfate  325 mg Oral Q breakfast   FLUoxetine  40 mg Oral Daily   influenza vaccine adjuvanted  0.5 mL Intramuscular Tomorrow-1000   insulin aspart  0-5 Units Subcutaneous QHS   insulin aspart  0-9 Units Subcutaneous TID WC   multivitamin with minerals  1 tablet Oral Daily   pantoprazole  40 mg Oral Daily   potassium chloride SA  20 mEq Oral Daily   tamsulosin  0.8 mg Oral QHS   traZODone  100 mg Oral QHS   vitamin B-12  1,000 mcg Oral Daily   Continuous Infusions:  sodium chloride 100 mL/hr at 09/25/21 0700   ceFEPime (MAXIPIME) IV Stopped (09/25/21 0551)   vancomycin Stopped (09/25/21 0118)          Aline August, MD [facility] 09/25/2021, 11:04 AM

## 2021-09-25 NOTE — Consult Note (Signed)
Reason for Consult:Right foot wagner 2-3 ulcer with cellulitis Referring Physician: Aline August MD  [name] is an 75 y.o. male.  HPI: 75 yo male with  type 2 DM , CKD stage 3 , pacreatic CA , with right foot ulcer with increased drainage. MRI showed fluid around 5th MT head without obvious osteomyelitis. Cellulitis without septic arthritis.  On IV ABX.Peripheral neuropathy.   Past Medical History:  Diagnosis Date   A-fib ([place]) 03/04/2021   Anemia    Arthritis    Cancer ([facility])    prostate   Chronic kidney disease    blood in urine    Constipation    Coronary artery disease    Depression    Diabetes mellitus without complication ([place])    Difficult intubation    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 it was hard to get the tube down his throat   Dyspnea    Family history of breast cancer    Fatty liver    Fatty liver    Frequent headaches    GERD (gastroesophageal reflux disease)    History of chicken pox    History of fainting spells of unknown cause    History of prostate cancer    Hyperlipidemia    Hypertension    Myocardial infarction [facility])    Peripheral neuropathy    Pneumonia    Prostate cancer ([place])    PTSD (post-traumatic stress disorder)    Sleep apnea    uses Cpap    Past Surgical History:  Procedure Laterality Date   APPENDECTOMY     BIOPSY  01/15/2021   Procedure: BIOPSY;  Surgeon: Irving Copas., MD;  Location: [place];  Service: Gastroenterology;;   CARDIAC CATHETERIZATION  06/26/2017   CARDIAC SURGERY     Triple Bypass   CHOLECYSTECTOMY  2010   COLONOSCOPY     CORONARY ARTERY BYPASS GRAFT  2004   ESOPHAGOGASTRODUODENOSCOPY (EGD) WITH PROPOFOL N/A 01/15/2021   Procedure: ESOPHAGOGASTRODUODENOSCOPY (EGD) WITH PROPOFOL;  Surgeon: Irving Copas., MD;  Location: [facility] ENDOSCOPY;  Service: Gastroenterology;  Laterality: N/A;   EUS N/A 01/15/2021   Procedure: UPPER ENDOSCOPIC ULTRASOUND (EUS) RADIAL;  Surgeon: Irving Copas.,  MD;  Location: [place];  Service: Gastroenterology;  Laterality: N/A;   EYE SURGERY Bilateral    cataract removal   FINE NEEDLE ASPIRATION  01/15/2021   Procedure: FINE NEEDLE ASPIRATION (FNA) LINEAR;  Surgeon: Irving Copas., MD;  Location: [facility] ENDOSCOPY;  Service: Gastroenterology;;   LAPAROSCOPY N/A 07/01/2021   Procedure: STAGING LAPAROSCOPY;  Surgeon: Dwan Bolt, MD;  Location: [place];  Service: General;  Laterality: N/A;   PORTACATH PLACEMENT Right 02/21/2021   Procedure: INSERTION PORT-A-CATH;  Surgeon: Dwan Bolt, MD;  Location: [place];  Service: General;  Laterality: Right;   SPLENECTOMY, 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   Procedure: SPLENECTOMY;  Surgeon: Dwan Bolt, MD;  Location: [place];  Service: General;  Laterality: N/A;   TONSILLECTOMY  1958    Family History  Problem Relation Age of Onset   Arthritis Mother    Heart disease Mother    Hyperlipidemia Mother    Hypertension Mother    Heart attack Mother    Breast cancer Sister        dx 59s   Heart attack Brother    Heart disease Brother    Depression Daughter    Arthritis Sister    Cancer Sister        unknown type, dx 36s  Cancer Brother    Learning disabilities Brother    Alcohol abuse Brother    COPD Brother    Heart attack Brother    Heart disease Brother     Social History:  reports that he quit smoking about 45 years ago. His smoking use included cigarettes. He has never used smokeless tobacco. He reports that he does not currently use alcohol. He reports that he does not use drugs.  Allergies:  Allergies  Allergen Reactions   Furosemide Other (See Comments)    Unknown   Keflex [Cephalexin] Other (See Comments)    Hallucinations?   Lisinopril Other (See Comments)    Unknown   Terazosin Other (See Comments)    Unknown    Medications: I have reviewed the patient's current medications.  Results for orders placed or performed during the hospital encounter of 09/22/21 (from the past 48  hour(s))  Glucose, capillary     Status: Abnormal   Collection Time: 09/23/21 11:09 AM  Result Value Ref Range   Glucose-Capillary 144 (H) 70 - 99 mg/dL    Comment: Glucose reference range applies only to samples taken after fasting for at least 8 hours.  Glucose, capillary     Status: Abnormal   Collection Time: 09/23/21  3:48 PM  Result Value Ref Range   Glucose-Capillary 222 (H) 70 - 99 mg/dL    Comment: Glucose reference range applies only to samples taken after fasting for at least 8 hours.  Glucose, capillary     Status: Abnormal   Collection Time: 09/23/21 10:15 PM  Result Value Ref Range   Glucose-Capillary 109 (H) 70 - 99 mg/dL    Comment: Glucose reference range applies only to samples taken after fasting for at least 8 hours.  Basic metabolic panel     Status: Abnormal   Collection Time: 09/24/21  5:20 AM  Result Value Ref Range   Sodium 137 135 - 145 mmol/L   Potassium 3.6 3.5 - 5.1 mmol/L   Chloride 107 98 - 111 mmol/L   CO2 23 22 - 32 mmol/L   Glucose, Bld 87 70 - 99 mg/dL    Comment: Glucose reference range applies only to samples taken after fasting for at least 8 hours.   BUN 12 8 - 23 mg/dL   Creatinine, Ser 0.79 0.61 - 1.24 mg/dL   Calcium 8.2 (L) 8.9 - 10.3 mg/dL   GFR, Estimated >60 >60 mL/min    Comment: (NOTE) Calculated using the CKD-EPI Creatinine Equation (2021)    Anion gap 7 5 - 15    Comment: Performed at [facility], [place] [address] [place], [place] [address]  CBC with Differential/Platelet     Status: Abnormal   Collection Time: 09/24/21  5:20 AM  Result Value Ref Range   WBC 5.0 4.0 - 10.5 K/uL   RBC 3.51 (L) 4.22 - 5.81 MIL/uL   Hemoglobin 9.8 (L) 13.0 - 17.0 g/dL   HCT 30.5 (L) 39.0 - 52.0 %   MCV 86.9 80.0 - 100.0 fL   MCH 27.9 26.0 - 34.0 pg   MCHC 32.1 30.0 - 36.0 g/dL   RDW 21.3 (H) 11.5 - 15.5 %   Platelets 182 150 - 400 K/uL   nRBC 0.0 0.0 - 0.2 %   Neutrophils Relative % 45 %   Neutro Abs 2.2 1.7 - 7.7 K/uL    Lymphocytes Relative 47 %   Lymphs Abs 2.4 0.7 - 4.0 K/uL   Monocytes Relative 4 %  Monocytes Absolute 0.2 0.1 - 1.0 K/uL   Eosinophils Relative 2 %   Eosinophils Absolute 0.1 0.0 - 0.5 K/uL   Basophils Relative 1 %   Basophils Absolute 0.0 0.0 - 0.1 K/uL   Immature Granulocytes 1 %   Abs Immature Granulocytes 0.05 0.00 - 0.07 K/uL   Acanthocytes PRESENT    Target Cells PRESENT     Comment: Performed at [facility], [place] [address], [place], [place] [address]  Glucose, capillary     Status: None   Collection Time: 09/24/21  8:11 AM  Result Value Ref Range   Glucose-Capillary 71 70 - 99 mg/dL    Comment: Glucose reference range applies only to samples taken after fasting for at least 8 hours.  Glucose, capillary     Status: Abnormal   Collection Time: 09/24/21 11:42 AM  Result Value Ref Range   Glucose-Capillary 242 (H) 70 - 99 mg/dL    Comment: Glucose reference range applies only to samples taken after fasting for at least 8 hours.  Glucose, capillary     Status: Abnormal   Collection Time: 09/24/21  4:39 PM  Result Value Ref Range   Glucose-Capillary 252 (H) 70 - 99 mg/dL    Comment: Glucose reference range applies only to samples taken after fasting for at least 8 hours.   Comment 1 Notify RN    Comment 2 Document in Chart   Glucose, capillary     Status: Abnormal   Collection Time: 09/24/21  8:21 PM  Result Value Ref Range   Glucose-Capillary 162 (H) 70 - 99 mg/dL    Comment: Glucose reference range applies only to samples taken after fasting for at least 8 hours.  CBC     Status: Abnormal   Collection Time: 09/25/21  3:54 AM  Result Value Ref Range   WBC 5.4 4.0 - 10.5 K/uL   RBC 3.22 (L) 4.22 - 5.81 MIL/uL   Hemoglobin 8.8 (L) 13.0 - 17.0 g/dL   HCT 28.2 (L) 39.0 - 52.0 %   MCV 87.6 80.0 - 100.0 fL   MCH 27.3 26.0 - 34.0 pg   MCHC 31.2 30.0 - 36.0 g/dL   RDW 21.2 (H) 11.5 - 15.5 %   Platelets 146 (L) 150 - 400 K/uL    Comment: CONSISTENT  WITH PREVIOUS RESULT REPEATED TO VERIFY    nRBC 0.0 0.0 - 0.2 %    Comment: Performed at [facility], [place] [address] [place], [place] [address]  Basic metabolic panel     Status: Abnormal   Collection Time: 09/25/21  3:54 AM  Result Value Ref Range   Sodium 138 135 - 145 mmol/L   Potassium 3.9 3.5 - 5.1 mmol/L   Chloride 109 98 - 111 mmol/L   CO2 23 22 - 32 mmol/L   Glucose, Bld 180 (H) 70 - 99 mg/dL    Comment: Glucose reference range applies only to samples taken after fasting for at least 8 hours.   BUN 12 8 - 23 mg/dL   Creatinine, Ser 0.59 (L) 0.61 - 1.24 mg/dL   Calcium 8.2 (L) 8.9 - 10.3 mg/dL   GFR, Estimated >60 >60 mL/min    Comment: (NOTE) Calculated using the CKD-EPI Creatinine Equation (2021)    Anion gap 6 5 - 15    Comment: Performed at [facility], [place] [address], [place], [place] [address]  Glucose, capillary     Status: Abnormal   Collection Time: 09/25/21  7:26 AM  Result Value Ref Range   Glucose-Capillary 137 (H) 70 - 99 mg/dL    Comment: Glucose reference range applies only to samples taken after fasting for at least 8 hours.    MR FOOT RIGHT WO CONTRAST  Result Date: 09/24/2021 CLINICAL DATA:  Diabetic foot ulcer. EXAM: MRI OF THE RIGHT FOREFOOT WITHOUT CONTRAST TECHNIQUE: Multiplanar, multisequence MR imaging of the right foot was performed. No intravenous contrast was administered. COMPARISON:  Radiographs 09/22/2021 FINDINGS: Very limited examination due to patient motion. There is an open wound noted along the lateral aspect of the forefoot at the level of the fifth MTP joint. There is some fluid surrounding the fifth metatarsal head. I do not however see any definite MR findings for septic arthritis or osteomyelitis. There is diffuse cellulitis and myofasciitis. IMPRESSION: 1. Very limited examination due to patient motion. 2. Open wound along the lateral aspect of the forefoot at the level of the fifth MTP joint.  There is some fluid surrounding the fifth metatarsal head but I do not see any definite MR findings for septic arthritis or osteomyelitis. 3. Diffuse cellulitis and myofasciitis. Electronically Signed   By: Marijo Sanes M.D.   On: 09/24/2021 08:14   VAS [place] LOWER EXTREMITY VENOUS (DVT)  Result Date: 09/24/2021  Lower Venous DVT Study Patient Name:  SHEY BARTMESS [facility]  Date of Exam:   09/24/2021 Medical Rec #: [id number]              Accession #:    [id number] Date of Birth: October 13, 1946             Patient Gender: M Patient Age:   38 years Exam Location:  [facility] Procedure:      VAS [place] LOWER EXTREMITY VENOUS (DVT) Referring Phys: Imagene Sheller --------------------------------------------------------------------------------  Indications: Swelling.  Risk Factors: None identified. Limitations: Poor ultrasound/tissue interface. Comparison Study: No prior studies. Performing Technologist: Oliver Hum RVT  Examination Guidelines: A complete evaluation includes B-mode imaging, spectral Doppler, color Doppler, and power Doppler as needed of all accessible portions of each vessel. Bilateral testing is considered an integral part of a complete examination. Limited examinations for reoccurring indications may be performed as noted. The reflux portion of the exam is performed with the patient in reverse Trendelenburg.  +---------+---------------+---------+-----------+----------+--------------+ RIGHT    CompressibilityPhasicitySpontaneityPropertiesThrombus Aging +---------+---------------+---------+-----------+----------+--------------+ CFV      Full           Yes      Yes                                 +---------+---------------+---------+-----------+----------+--------------+ SFJ      Full                                                        +---------+---------------+---------+-----------+----------+--------------+ FV Prox  Full                                                         +---------+---------------+---------+-----------+----------+--------------+ FV Mid   Full                                                        +---------+---------------+---------+-----------+----------+--------------+  FV DistalFull                                                        +---------+---------------+---------+-----------+----------+--------------+ PFV      Full                                                        +---------+---------------+---------+-----------+----------+--------------+ POP      Full           Yes      Yes                                 +---------+---------------+---------+-----------+----------+--------------+ PTV      Full                                                        +---------+---------------+---------+-----------+----------+--------------+ PERO     Full                                                        +---------+---------------+---------+-----------+----------+--------------+   +----+---------------+---------+-----------+----------+--------------+ LEFTCompressibilityPhasicitySpontaneityPropertiesThrombus Aging +----+---------------+---------+-----------+----------+--------------+ CFV Full           Yes      Yes                                 +----+---------------+---------+-----------+----------+--------------+    Summary: RIGHT: - There is no evidence of deep vein thrombosis in the lower extremity.  - No cystic structure found in the popliteal fossa.  LEFT: - No evidence of common femoral vein obstruction.  *See table(s) above for measurements and observations. Electronically signed by Servando Snare MD on 09/24/2021 at 1:50:03 PM.    Final     ROSall other systems updated noncontributory to HPI Blood pressure (!) 141/66, pulse 89, temperature 97.8 F (36.6 C), temperature source Oral, resp. rate 16, height 5\' 11"  (1.803 m), weight 108.9 kg, SpO2 94 %. Physical Exam Constitutional:       Appearance: Normal appearance.  HENT:     Head: Normocephalic.     Right Ear: External ear normal.     Left Ear: External ear normal.  Cardiovascular:     Rate and Rhythm: Normal rate.  Pulmonary:     Effort: Pulmonary effort is normal.     Breath sounds: No wheezing.  Abdominal:     General: There is no distension.  Neurological:     Mental Status: He is alert.    Assessment/Plan: Right 5th MT Wagner ulcer with cellulitis on IV ABX. At this point no obvious osteomyelitis by MRI. He can followup with me in office post discharge in 1 -2 wks.  No surgery at this point.   Marybelle Killings 09/25/2021, 8:04 AM

## 2021-09-25 NOTE — Progress Notes (Signed)
Pharmacy Antibiotic Note  [name] is a 75 y.o. male admitted on 09/22/2021 with  cellulitis and wound on right foot .  PMH significant for DM and peripheral neuropathy. Pharmacy has been consulted for vancomycin and cefepime dosing.  Today, 09/25/21 SCr has remained stable and WNL while on vancomycin Afebrile  Today is day #4 of broad spectrum antibiotics.   Plan: Continue cefepime 2 g IV q8h Continue vancomycin 1000 mg IV q12h for estimated AUC of 501 Monitor renal function. Check vancomycin levels at steady state as needed (goal AUC 400-550)  Height: 5\' 11"  (180.3 cm) Weight: 108.9 kg (240 lb) IBW/kg (Calculated) : 75.3  Temp (24hrs), Avg:98.3 F (36.8 C), Min:97.8 F (36.6 C), Max:98.6 F (37 C)  Recent Labs  Lab 09/22/21 1157 09/22/21 1224 09/22/21 1759 09/23/21 0518 09/24/21 0520 09/25/21 0354  WBC 7.4  --   --  7.9 5.0 5.4  CREATININE 0.82  --   --  0.80 0.79 0.59*  LATICACIDVEN  --  1.1 1.2  --   --   --     Estimated Creatinine Clearance: 98.6 mL/min (A) (by C-G formula based on SCr of 0.59 mg/dL (L)).    Allergies  Allergen Reactions   Furosemide Other (See Comments)    Unknown   Keflex [Cephalexin] Other (See Comments)    Hallucinations?   Lisinopril Other (See Comments)    Unknown   Terazosin Other (See Comments)    Unknown    Antimicrobials this admission: cefepime 10/9 >>  vancomycin 10/9 >>   Dose adjustments this admission:  Microbiology results: 10/9 BCx: ngtd  Thank you for allowing pharmacy to be a part of this patient's care.  Lenis Noon, PharmD 09/25/2021 11:33 AM

## 2021-09-26 LAB — BASIC METABOLIC PANEL
Anion gap: 5 (ref 5–15)
BUN: 11 mg/dL (ref 8–23)
CO2: 24 mmol/L (ref 22–32)
Calcium: 8.6 mg/dL — ABNORMAL LOW (ref 8.9–10.3)
Chloride: 112 mmol/L — ABNORMAL HIGH (ref 98–111)
Creatinine, Ser: 0.87 mg/dL (ref 0.61–1.24)
GFR, Estimated: >60 mL/min (ref >60)
Glucose, Bld: 169 mg/dL — ABNORMAL HIGH (ref 70–99)
Potassium: 3.9 mmol/L (ref 3.5–5.1)
Sodium: 141 mmol/L (ref 135–145)

## 2021-09-26 LAB — CBC WITH DIFFERENTIAL/PLATELET
Abs Immature Granulocytes: 0.04 10*3/uL (ref 0.00–0.07)
Basophils Absolute: 0 10*3/uL (ref 0.0–0.1)
Basophils Relative: 1 %
Eosinophils Absolute: 0.3 10*3/uL (ref 0.0–0.5)
Eosinophils Relative: 5 %
HCT: 28.6 % — ABNORMAL LOW (ref 39.0–52.0)
Hemoglobin: 9.1 g/dL — ABNORMAL LOW (ref 13.0–17.0)
Immature Granulocytes: 1 %
Lymphocytes Relative: 34 %
Lymphs Abs: 2.1 10*3/uL (ref 0.7–4.0)
MCH: 27.6 pg (ref 26.0–34.0)
MCHC: 31.8 g/dL (ref 30.0–36.0)
MCV: 86.7 fL (ref 80.0–100.0)
Monocytes Absolute: 1 10*3/uL (ref 0.1–1.0)
Monocytes Relative: 16 %
Neutro Abs: 2.7 10*3/uL (ref 1.7–7.7)
Neutrophils Relative %: 43 %
Platelets: 146 10*3/uL — ABNORMAL LOW (ref 150–400)
RBC: 3.3 MIL/uL — ABNORMAL LOW (ref 4.22–5.81)
RDW: 21.2 % — ABNORMAL HIGH (ref 11.5–15.5)
WBC: 6.1 10*3/uL (ref 4.0–10.5)
nRBC: 1 % — ABNORMAL HIGH (ref 0.0–0.2)

## 2021-09-26 LAB — GLUCOSE, CAPILLARY
Glucose-Capillary: 186 mg/dL — ABNORMAL HIGH (ref 70–99)
Glucose-Capillary: 183 mg/dL — ABNORMAL HIGH (ref 70–99)
Glucose-Capillary: 183 mg/dL — ABNORMAL HIGH (ref 70–99)
Glucose-Capillary: 159 mg/dL — ABNORMAL HIGH (ref 70–99)

## 2021-09-26 LAB — C-REACTIVE PROTEIN: CRP: 8.4 mg/dL — ABNORMAL HIGH (ref <1.0)

## 2021-09-26 LAB — MAGNESIUM: Magnesium: 2 mg/dL (ref 1.7–2.4)

## 2021-09-26 MED ORDER — INSULIN GLARGINE-YFGN 100 UNIT/ML ~~LOC~~ SOLN
5.0000 [IU] | Freq: Every day | SUBCUTANEOUS | Status: DC
  Administered 2021-09-26 – 2021-09-27 (×2): 5 [IU] via SUBCUTANEOUS
  Filled 2021-09-26 (×2): qty 0.05

## 2021-09-26 NOTE — Progress Notes (Addendum)
Patient ID: [name], male   DOB: 11-19-1946, 75 y.o.   MRN: [id number]  PROGRESS NOTE    [name]  KGY:[id number] DOB: [birth year] DOA: 09/22/2021 PCP: [name], [name] G, MD   Brief Narrative:  75 year old male with history of diabetes mellitus type 2, A. fib on chronic anticoagulation, pancreatic adenocarcinoma, CAD, osteoarthritis, depression, GERD, hyperlipidemia, essential hypertension, peripheral neuropathy and peripheral vascular disease presented with right foot pain and wound with some drainage.  He was admitted for right foot cellulitis and started on IV antibiotics.  MRI showed no osteomyelitis but did show mild fasciitis and cellulitis.  Orthopedics was consulted: Recommended conservative management.  Assessment & Plan:   Right foot cellulitis with right fifth metatarsal ulcer: Present on admission - MRI showed no osteomyelitis but did show mild fasciitis and cellulitis. -Orthopedics recommends conservative management and outpatient follow-up with orthopedics -Currently on broad-spectrum antibiotics.  DC'd IV fluids.  Hemodynamically stable. -Right lower extremity duplex ultrasound negative for DVT.  ABI showed abnormal right toe brachial index.  Communicated with Dr. Lissa Morales surgery on 09/25/2021 via secure chat who recommended no need for any further vascular surgery intervention or follow-up.  Chronic A. Fib -Currently rate controlled.  Continue Eliquis.  Outpatient follow-up with cardiology  Hyperlipidemia -Continue statin  Diabetes mellitus type 2 with hyperglycemia -Continue CBGs with SSI.  A1c 8.2.  Start long-acting insulin at a lower dose.  BPH -Continue tamsulosin  Anxiety/depression --continue fluoxetine  History of pancreatic adenocarcinoma -Outpatient follow-up with oncology/Dr. Benay Spice  Anemia of chronic disease -Hemoglobin stable.  Monitor  Thrombocytopenia -Monitor.  No signs of bleeding  Coronary artery  disease -Currently stable.  Hypertension -Blood pressure stable.  Not on any antihypertensives at home.   DVT prophylaxis: Eliquis Code Status: Full Family Communication: None at bedside Disposition Plan: Status is: Inpatient  Remains inpatient appropriate because:Inpatient level of care appropriate due to severity of illness  Dispo: The patient is from: Home              Anticipated d/c is to: Home              Patient currently is not medically stable to d/c.   Difficult to place patient No  Consultants: Orthopedics  Procedures: None  Antimicrobials:  Anti-infectives (From admission, onward)    Start     Dose/Rate Route Frequency Ordered Stop   09/23/21 0100  vancomycin (VANCOCIN) IVPB 1000 mg/200 mL premix        1,000 mg 200 mL/hr over 60 Minutes Intravenous Every 12 hours 09/22/21 1942     09/22/21 2100  ceFEPIme (MAXIPIME) 2 g in sodium chloride 0.9 % 100 mL IVPB        2 g 200 mL/hr over 30 Minutes Intravenous Every 8 hours 09/22/21 1942     09/22/21 1230  vancomycin (VANCOCIN) IVPB 1000 mg/200 mL premix        1,000 mg 200 mL/hr over 60 Minutes Intravenous  Once 09/22/21 1215 09/22/21 1411         Subjective: Patient seen and examined at bedside.  Denies overnight fever, worsening shortness breath or chest pain.  Still complains of right foot swelling and some pain but improving. Objective: Vitals:   09/25/21 0447 09/25/21 1451 09/25/21 2036 09/26/21 0452  BP: (!) 141/66 (!) 156/87 (!) 170/88 137/65  Pulse: 89 85 89 83  Resp: 16 17 16 14   Temp: 97.8 F (36.6 C) 98 F (36.7 C) 99.5 F (37.5 C) 97.6 F (36.4 C)  TempSrc: Oral Oral Oral Oral  SpO2: 94% 95% 97% 94%  Weight:    116 kg  Height:        Intake/Output Summary (Last 24 hours) at 09/26/2021 0823 Last data filed at 09/25/2021 1204 Gross per 24 hour  Intake 240 ml  Output --  Net 240 ml    Filed Weights   09/22/21 1148 09/26/21 0452  Weight: 108.9 kg 116 kg     Examination:  General exam: No distress.  Still on room air.   Respiratory system: Decreased breath sounds at bases bilaterally with some crackles Cardiovascular system: Rate controlled, S1-S2 heard gastrointestinal system: Abdomen is obese, mildly distended.  Soft and nontender.  Normal bowel sounds heard extremities: Right lower extremity edema present; no cyanosis.  Right foot dressing present; swelling and redness improving.   Data Reviewed: I have personally reviewed following labs and imaging studies  CBC: Recent Labs  Lab 09/22/21 1157 09/23/21 0518 09/24/21 0520 09/25/21 0354 09/26/21 0525  WBC 7.4 7.9 5.0 5.4 6.1  NEUTROABS 6.3  --  2.2  --  2.7  HGB 9.8* 9.1* 9.8* 8.8* 9.1*  HCT 31.0* 29.2* 30.5* 28.2* 28.6*  MCV 84.9 87.2 86.9 87.6 86.7  PLT 216 199 182 146* 146*    Basic Metabolic Panel: Recent Labs  Lab 09/22/21 1157 09/23/21 0518 09/24/21 0520 09/25/21 0354 09/26/21 0525  NA 135 136 137 138 141  K 3.8 3.3* 3.6 3.9 3.9  CL 104 106 107 109 112*  CO2 23 25 23 23 24   GLUCOSE 153* 50* 87 180* 169*  BUN 19 15 12 12 11   CREATININE 0.82 0.80 0.79 0.59* 0.87  CALCIUM 8.1* 8.0* 8.2* 8.2* 8.6*  MG  --   --   --   --  2.0    GFR: Estimated Creatinine Clearance: 93.6 mL/min (by C-G formula based on SCr of 0.87 mg/dL). Liver Function Tests: Recent Labs  Lab 09/22/21 1157 09/23/21 0518  AST 42* 36  ALT 32 29  ALKPHOS 71 76  BILITOT 0.6 0.7  PROT 5.8* 5.5*  ALBUMIN 3.0* 2.7*    No results for input(s): LIPASE, AMYLASE in the last 168 hours. No results for input(s): AMMONIA in the last 168 hours. Coagulation Profile: Recent Labs  Lab 09/22/21 1224  INR 1.8*    Cardiac Enzymes: No results for input(s): CKTOTAL, CKMB, CKMBINDEX, TROPONINI in the last 168 hours. BNP (last 3 results) No results for input(s): PROBNP in the last 8760 hours. HbA1C: No results for input(s): HGBA1C in the last 72 hours.  CBG: Recent Labs  Lab 09/25/21 0726  09/25/21 1153 09/25/21 1727 09/25/21 2037 09/26/21 0735  GLUCAP 137* 185* 225* 181* 186*    Lipid Profile: No results for input(s): CHOL, HDL, LDLCALC, TRIG, CHOLHDL, LDLDIRECT in the last 72 hours. Thyroid Function Tests: No results for input(s): TSH, T4TOTAL, FREET4, T3FREE, THYROIDAB in the last 72 hours. Anemia Panel: No results for input(s): VITAMINB12, FOLATE, FERRITIN, TIBC, IRON, RETICCTPCT in the last 72 hours. Sepsis Labs: Recent Labs  Lab 09/22/21 1224 09/22/21 1759  LATICACIDVEN 1.1 1.2     Recent Results (from the past 240 hour(s))  Blood culture (routine single)     Status: None (Preliminary result)   Collection Time: 09/22/21 12:20 PM   Specimen: BLOOD  Result Value Ref Range Status   Specimen Description   Final    BLOOD RIGHT PORTA CATH Performed at [facility], [address] [place], [place] [address]    Special  Requests   Final    BOTTLES DRAWN AEROBIC AND ANAEROBIC Blood Culture adequate volume Performed at [facility], [address] [place], [place] [address]    Culture   Final    NO GROWTH 3 DAYS Performed at [facility], [place] [address], [place], [place] [address]    Report Status PENDING  Incomplete  Resp Panel by RT-PCR (Flu A&B, Covid) Peripheral     Status: None   Collection Time: 09/22/21 12:24 PM   Specimen: Peripheral; Nasopharyngeal(NP) swabs in vial transport medium  Result Value Ref Range Status   SARS Coronavirus 2 by RT PCR NEGATIVE NEGATIVE Final    Comment: (NOTE) SARS-CoV-2 target nucleic acids are NOT DETECTED.  The SARS-CoV-2 RNA is generally detectable in upper respiratory specimens during the acute phase of infection. The lowest concentration of SARS-CoV-2 viral copies this assay can detect is 138 copies/mL. A negative result does not preclude SARS-Cov-2 infection and should not be used as the sole basis for treatment or other patient management decisions. A negative  result may occur with  improper specimen collection/handling, submission of specimen other than nasopharyngeal swab, presence of viral mutation(s) within the areas targeted by this assay, and inadequate number of viral copies(<138 copies/mL). A negative result must be combined with clinical observations, patient history, and epidemiological information. The expected result is Negative.  Fact Sheet for Patients:  EntrepreneurPulse.com.au  Fact Sheet for Healthcare Providers:  IncredibleEmployment.be  This test is no t yet approved or cleared by the [place] FDA and  has been authorized for detection and/or diagnosis of SARS-CoV-2 by FDA under an Emergency Use Authorization (EUA). This EUA will remain  in effect (meaning this test can be used) for the duration of the COVID-19 declaration under Section 564(b)(1) of the Act, 21 U.S.C.section 360bbb-3(b)(1), unless the authorization is terminated  or revoked sooner.       Influenza A by PCR NEGATIVE NEGATIVE Final   Influenza B by PCR NEGATIVE NEGATIVE Final    Comment: (NOTE) The Xpert Xpress SARS-CoV-2/FLU/RSV plus assay is intended as an aid in the diagnosis of influenza from Nasopharyngeal swab specimens and should not be used as a sole basis for treatment. Nasal washings and aspirates are unacceptable for Xpert Xpress SARS-CoV-2/FLU/RSV testing.  Fact Sheet for Patients: EntrepreneurPulse.com.au  Fact Sheet for Healthcare Providers: IncredibleEmployment.be  This test is not yet approved or cleared by the [place] FDA and has been authorized for detection and/or diagnosis of SARS-CoV-2 by FDA under an Emergency Use Authorization (EUA). This EUA will remain in effect (meaning this test can be used) for the duration of the COVID-19 declaration under Section 564(b)(1) of the Act, 21 U.S.C. section 360bbb-3(b)(1), unless the authorization is  terminated or revoked.  Performed at [facility], [address], [place], [place] [address]           Radiology Studies: VAS [place] ABI WITH/WO TBI  Result Date: 09/25/2021  LOWER EXTREMITY DOPPLER STUDY Patient Name:  Jemiah Ellenburg  Date of Exam:   09/25/2021 Medical Rec #: [id number]              Accession #:    [id number] Date of Birth: 1946-01-30             Patient Gender: M Patient Age:   58 years Exam Location:  [facility] Procedure:      VAS [place] ABI WITH/WO TBI Referring Phys: Imagene Sheller --------------------------------------------------------------------------------  Indications: Ulceration. High Risk Factors:  Hypertension, Diabetes.  Comparison Study: No prior studies. Performing Technologist: Carlos Levering RVT  Examination Guidelines: A complete evaluation includes at minimum, Doppler waveform signals and systolic blood pressure reading at the level of bilateral brachial, anterior tibial, and posterior tibial arteries, when vessel segments are accessible. Bilateral testing is considered an integral part of a complete examination. Photoelectric Plethysmograph (PPG) waveforms and toe systolic pressure readings are included as required and additional duplex testing as needed. Limited examinations for reoccurring indications may be performed as noted.  ABI Findings: +---------+------------------+-----+---------+--------+ Right    Rt Pressure (mmHg)IndexWaveform Comment  +---------+------------------+-----+---------+--------+ Brachial 153                    triphasic         +---------+------------------+-----+---------+--------+ PTA      222               1.34 biphasic          +---------+------------------+-----+---------+--------+ DP       204               1.23 biphasic          +---------+------------------+-----+---------+--------+ Donalee Citrin               0.69                    +---------+------------------+-----+---------+--------+ +---------+------------------+-----+---------+-------+ Left     Lt Pressure (mmHg)IndexWaveform Comment +---------+------------------+-----+---------+-------+ Brachial 166                    triphasic        +---------+------------------+-----+---------+-------+ PTA      250               1.51 triphasic        +---------+------------------+-----+---------+-------+ DP       241               1.45 triphasic        +---------+------------------+-----+---------+-------+ Great Toe146               0.88                  +---------+------------------+-----+---------+-------+ +-------+-----------+-----------+------------+------------+ ABI/TBIToday's ABIToday's TBIPrevious ABIPrevious TBI +-------+-----------+-----------+------------+------------+ Right  1.34       0.69                                +-------+-----------+-----------+------------+------------+ Left   1.51       0.88                                +-------+-----------+-----------+------------+------------+  Summary: Right: Resting right ankle-brachial index indicates noncompressible right lower extremity arteries. The right toe-brachial index is abnormal. Biphasic waveforms are noted in the dorsalis pedis and posterior tibial arteries. Left: Resting left ankle-brachial index indicates noncompressible left lower extremity arteries. The left toe-brachial index is normal. Triphasic waveforms are noted in the dorsalis pedis and posterior tibial arteries.  *See table(s) above for measurements and observations.  Electronically signed by Harold Barban MD on 09/25/2021 at 9:26:30 PM.    Final    VAS [place] LOWER EXTREMITY VENOUS (DVT)  Result Date: 09/24/2021  Lower Venous DVT Study Patient Name:  ROWYN SPILDE [facility]  Date of Exam:   09/24/2021 Medical Rec #: [id number]              Accession #:  [id number] Date of Birth: 17-Apr-1946             Patient Gender: M  Patient Age:   6 years Exam Location:  [facility] Procedure:      VAS [place] LOWER EXTREMITY VENOUS (DVT) Referring Phys: Imagene Sheller --------------------------------------------------------------------------------  Indications: Swelling.  Risk Factors: None identified. Limitations: Poor ultrasound/tissue interface. Comparison Study: No prior studies. Performing Technologist: Oliver Hum RVT  Examination Guidelines: A complete evaluation includes B-mode imaging, spectral Doppler, color Doppler, and power Doppler as needed of all accessible portions of each vessel. Bilateral testing is considered an integral part of a complete examination. Limited examinations for reoccurring indications may be performed as noted. The reflux portion of the exam is performed with the patient in reverse Trendelenburg.  +---------+---------------+---------+-----------+----------+--------------+ RIGHT    CompressibilityPhasicitySpontaneityPropertiesThrombus Aging +---------+---------------+---------+-----------+----------+--------------+ CFV      Full           Yes      Yes                                 +---------+---------------+---------+-----------+----------+--------------+ SFJ      Full                                                        +---------+---------------+---------+-----------+----------+--------------+ FV Prox  Full                                                        +---------+---------------+---------+-----------+----------+--------------+ FV Mid   Full                                                        +---------+---------------+---------+-----------+----------+--------------+ FV DistalFull                                                        +---------+---------------+---------+-----------+----------+--------------+ PFV      Full                                                         +---------+---------------+---------+-----------+----------+--------------+ POP      Full           Yes      Yes                                 +---------+---------------+---------+-----------+----------+--------------+ PTV      Full                                                        +---------+---------------+---------+-----------+----------+--------------+  PERO     Full                                                        +---------+---------------+---------+-----------+----------+--------------+   +----+---------------+---------+-----------+----------+--------------+ LEFTCompressibilityPhasicitySpontaneityPropertiesThrombus Aging +----+---------------+---------+-----------+----------+--------------+ CFV Full           Yes      Yes                                 +----+---------------+---------+-----------+----------+--------------+    Summary: RIGHT: - There is no evidence of deep vein thrombosis in the lower extremity.  - No cystic structure found in the popliteal fossa.  LEFT: - No evidence of common femoral vein obstruction.  *See table(s) above for measurements and observations. Electronically signed by Servando Snare MD on 09/24/2021 at 1:50:03 PM.    Final         Scheduled Meds:  apixaban  5 mg Oral BID   vitamin C  250 mg Oral BID   atorvastatin  40 mg Oral QHS   calcium-vitamin D  1 tablet Oral BID   Chlorhexidine Gluconate Cloth  6 each Topical Daily   feeding supplement  237 mL Oral TID BM   ferrous sulfate  325 mg Oral Q breakfast   FLUoxetine  40 mg Oral Daily   influenza vaccine adjuvanted  0.5 mL Intramuscular Tomorrow-1000   insulin aspart  0-5 Units Subcutaneous QHS   insulin aspart  0-9 Units Subcutaneous TID WC   multivitamin with minerals  1 tablet Oral Daily   pantoprazole  40 mg Oral Daily   potassium chloride SA  20 mEq Oral Daily   tamsulosin  0.8 mg Oral QHS   traZODone  100 mg Oral QHS   vitamin B-12  1,000 mcg Oral Daily    Continuous Infusions:  ceFEPime (MAXIPIME) IV 2 g (09/26/21 0510)   vancomycin 1,000 mg (09/26/21 0006)          Aline August, MD [facility] 09/26/2021, 8:23 AM

## 2021-09-26 NOTE — Progress Notes (Signed)
Pt refused CPAP qhs.  Machine remains in room in case Pt changes his mind.

## 2021-09-26 NOTE — TOC Initial Note (Signed)
Transition of Care [facility]) - Initial/Assessment Note    Patient Details  Name: [name] MRN: [id number] Date of Birth: 1945-12-20  Transition of Care [facility]) CM/SW Contact:    Lynnell Catalan, RN Phone Number: 09/26/2021, 2:26 PM  Clinical Narrative:                 Spoke with pt about wound care at home. He declines home health services and says that his wife can do the dressing changes. He states that she can come in tomorrow and learn from the RN. RN made aware.  Expected Discharge Plan: Home/Self Care Barriers to Discharge: Continued Medical Work up   Patient Goals and CMS Choice Patient states their goals for this hospitalization and ongoing recovery are:: To go home      Expected Discharge Plan and Services Expected Discharge Plan: Home/Self Care   Discharge Planning Services: CM Consult   Living arrangements for the past 2 months: Single Family Home                     Prior Living Arrangements/Services Living arrangements for the past 2 months: Single Family Home Lives with:: Spouse Patient language and need for interpreter reviewed:: Yes Do you feel safe going back to the place where you live?: Yes      Need for Family Participation in Patient Care: Yes (Comment) Care giver support system in place?: Yes (comment)   Criminal Activity/Legal Involvement Pertinent to Current Situation/Hospitalization: No - Comment as needed  Activities of Daily Living Home Assistive Devices/Equipment: Cane (specify quad or straight), Wheelchair, CBG Meter, CPAP (4 wheel walker) ADL Screening (condition at time of admission) Patient's cognitive ability adequate to safely complete daily activities?: Yes Is the patient deaf or have difficulty hearing?: No Does the patient have difficulty seeing, even when wearing glasses/contacts?: No Does the patient have difficulty concentrating, remembering, or making decisions?: No Patient able to express need for assistance with  ADLs?: Yes Does the patient have difficulty dressing or bathing?: No Independently performs ADLs?: Yes (appropriate for developmental age) Does the patient have difficulty walking or climbing stairs?: Yes (r/t to swelling to R foot) Weakness of Legs: Both Weakness of Arms/Hands: None  Permission Sought/Granted                  Emotional Assessment   Attitude/Demeanor/Rapport: Engaged Affect (typically observed): Calm Orientation: : Oriented to Self, Oriented to Place, Oriented to  Time, Oriented to Situation Alcohol / Substance Use: Not Applicable Psych Involvement: No (comment)  Admission diagnosis:  Cellulitis [L03.90] Cellulitis of right foot [L03.115] Patient Active Problem List   Diagnosis Date Noted   Cellulitis 09/22/2021   Gastritis, 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   Malnutrition of moderate degree 07/02/2021   Pancreatic cancer ([place]) 07/01/2021   Pancreatic adenocarcinoma ([place]) 07/01/2021   Coronary artery disease of bypass graft of native heart with stable angina pectoris ([place]) 06/04/2021   Genetic testing 04/04/2021   History of prostate cancer    Family history of breast cancer    Port-A-Cath in place 03/14/2021   Atrial fibrillation ([place])    Syncope and collapse 03/04/2021   Primary cancer of body of pancreas ([place]) 01/31/2021   Chest pain of uncertain etiology 78/93/8101   Elevated lipase 10/17/2020   H/O agent Orange exposure 10/17/2020   Dyslipidemia, goal LDL below 70 10/17/2020   Hx of CABG 02/28/2020   DM (diabetes mellitus), type 2 with renal complications ([place]) 75/09/2584   Hypertension with heart  disease 04/01/2019   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[place]) 04/01/2019   CKD (chronic kidney disease), stage III ([place]) 04/01/2019   Peripheral neuropathy 04/01/2019   Insomnia 11/30/2018   PCP:  [name], [name] G, MD Pharmacy:   [facility] [place], [place] - [address] AT [facility] [address] [place]  [place] [address] Phone: [phone] Fax: [phone]     Social Determinants of Health (SDOH) Interventions    Readmission Risk Interventions Readmission Risk Prevention Plan 09/26/2021  Transportation Screening Complete  PCP or Specialist Appt within 3-5 Days Complete  HRI or [place] Complete  Social Work Consult for [place] Planning/Counseling Complete  Palliative Care Screening Not Applicable  Medication Review Press photographer) Complete  Some recent data might be hidden

## 2021-09-27 LAB — CBC WITH DIFFERENTIAL/PLATELET
Abs Immature Granulocytes: 0.03 10*3/uL (ref 0.00–0.07)
Basophils Absolute: 0 10*3/uL (ref 0.0–0.1)
Basophils Relative: 1 %
Eosinophils Absolute: 0.2 10*3/uL (ref 0.0–0.5)
Eosinophils Relative: 4 %
HCT: 29.3 % — ABNORMAL LOW (ref 39.0–52.0)
Hemoglobin: 9.1 g/dL — ABNORMAL LOW (ref 13.0–17.0)
Immature Granulocytes: 1 %
Lymphocytes Relative: 32 %
Lymphs Abs: 1.8 10*3/uL (ref 0.7–4.0)
MCH: 26.8 pg (ref 26.0–34.0)
MCHC: 31.1 g/dL (ref 30.0–36.0)
MCV: 86.2 fL (ref 80.0–100.0)
Monocytes Absolute: 1.3 10*3/uL — ABNORMAL HIGH (ref 0.1–1.0)
Monocytes Relative: 22 %
Neutro Abs: 2.4 10*3/uL (ref 1.7–7.7)
Neutrophils Relative %: 40 %
Platelets: 169 10*3/uL (ref 150–400)
RBC: 3.4 MIL/uL — ABNORMAL LOW (ref 4.22–5.81)
RDW: 21.1 % — ABNORMAL HIGH (ref 11.5–15.5)
WBC: 5.8 10*3/uL (ref 4.0–10.5)
nRBC: 3.5 % — ABNORMAL HIGH (ref 0.0–0.2)

## 2021-09-27 LAB — BASIC METABOLIC PANEL
Anion gap: 7 (ref 5–15)
BUN: 11 mg/dL (ref 8–23)
CO2: 24 mmol/L (ref 22–32)
Calcium: 8.3 mg/dL — ABNORMAL LOW (ref 8.9–10.3)
Chloride: 103 mmol/L (ref 98–111)
Creatinine, Ser: 0.89 mg/dL (ref 0.61–1.24)
GFR, Estimated: >60 mL/min (ref >60)
Glucose, Bld: 220 mg/dL — ABNORMAL HIGH (ref 70–99)
Potassium: 3.5 mmol/L (ref 3.5–5.1)
Sodium: 134 mmol/L — ABNORMAL LOW (ref 135–145)

## 2021-09-27 LAB — CULTURE, BLOOD (SINGLE)
Culture: NO GROWTH
Special Requests: ADEQUATE

## 2021-09-27 LAB — MAGNESIUM: Magnesium: 1.8 mg/dL (ref 1.7–2.4)

## 2021-09-27 LAB — GLUCOSE, CAPILLARY
Glucose-Capillary: 192 mg/dL — ABNORMAL HIGH (ref 70–99)
Glucose-Capillary: 193 mg/dL — ABNORMAL HIGH (ref 70–99)

## 2021-09-27 LAB — C-REACTIVE PROTEIN: CRP: 6.5 mg/dL — ABNORMAL HIGH (ref <1.0)

## 2021-09-27 MED ORDER — DOXYCYCLINE HYCLATE 100 MG PO TABS: 100.0000 mg | ORAL_TABLET | Freq: Two times a day (BID) | ORAL | 0 refills | Status: DC

## 2021-09-27 MED ORDER — HEPARIN SOD (PORK) LOCK FLUSH 100 UNIT/ML IV SOLN
500.0000 [IU] | Freq: Once | INTRAVENOUS | Status: DC
  Filled 2021-09-27: qty 5

## 2021-09-27 MED ORDER — LOSARTAN POTASSIUM 50 MG PO TABS: 50.0000 mg | ORAL_TABLET | Freq: Every day | ORAL | 0 refills | Status: DC

## 2021-09-27 MED ORDER — AMOXICILLIN-POT CLAVULANATE 875-125 MG PO TABS: 1.0000 | ORAL_TABLET | Freq: Two times a day (BID) | ORAL | 0 refills | Status: AC

## 2021-09-27 MED ORDER — LOSARTAN POTASSIUM 50 MG PO TABS
50.0000 mg | ORAL_TABLET | Freq: Every day | ORAL | Status: DC
  Administered 2021-09-27: 50 mg via ORAL
  Filled 2021-09-27: qty 1

## 2021-09-27 NOTE — Progress Notes (Signed)
Educated how to change wound on right foot with Pt's wife.  She demonstrated the dressing change perfectly per orders.

## 2021-09-27 NOTE — Progress Notes (Signed)
Pharmacy Antibiotic Note  [name] is a 75 y.o. male admitted on 09/22/2021 with  cellulitis of R leg radiating from purulent ulcer on toe . CT shows fasciitis but no osteo or joint involvement. PMH significant for pancreatic CA on chemo, DM and peripheral neuropathy. Pharmacy has been consulted for vancomycin and cefepime dosing.  Today, 09/27/21 SCr has remained stable and WNL while on vancomycin Afebrile WBC stable WNL  Today is day 6 of  of broad spectrum antibiotics.   Plan: Continue cefepime 2 g IV q8h Continue vancomycin 1000 mg IV q12h (eAUC 501 w/ SCr 0.8; Vd 0.5) Will hold off on checking Vanc levels at this time as sounds like will be able to narrow today or tomorrow Consider narrowing abx to Doxycycline + Cefadroxil or Augmentin  Height: 5\' 11"  (180.3 cm) Weight: 113.2 kg (249 lb 9 oz) IBW/kg (Calculated) : 75.3  Temp (24hrs), Avg:98.2 F (36.8 C), Min:97.9 F (36.6 C), Max:98.5 F (36.9 C)  Recent Labs  Lab 09/22/21 1224 09/22/21 1759 09/23/21 0518 09/24/21 0520 09/25/21 0354 09/26/21 0525 09/27/21 0453  WBC  --   --  7.9 5.0 5.4 6.1 5.8  CREATININE  --   --  0.80 0.79 0.59* 0.87 0.89  LATICACIDVEN 1.1 1.2  --   --   --   --   --      Estimated Creatinine Clearance: 90.4 mL/min (by C-G formula based on SCr of 0.89 mg/dL).    Allergies  Allergen Reactions   Furosemide Other (See Comments)    Unknown   Keflex [Cephalexin] Other (See Comments)    Hallucinations?   Lisinopril Other (See Comments)    Unknown   Terazosin Other (See Comments)    Unknown    Antimicrobials this admission: cefepime 10/9 >>  vancomycin 10/9 >>   Dose adjustments this admission: n/a  Microbiology results: 10/9 BCx: ngtd  Thank you for allowing pharmacy to be a part of this patient's care.  Jeryn Bertoni, Cindie Laroche, PharmD 09/27/2021 2:24 PM

## 2021-09-27 NOTE — Discharge Summary (Signed)
Physician Discharge Summary  [name] OMB:[id number] DOB: [birth year] DOA: 09/22/2021  PCP: [name], [name] G, MD  Admit date: 09/22/2021 Discharge date: 09/27/2021  Admitted From: Home Disposition: Home  Recommendations for Outpatient Follow-up:  Follow up with PCP in 1-2 weeks Please obtain BMP/CBC in one week Patient plans to follow-up with orthopedics, Dr. Lorin Mercy and 2 weeks He has been advised to keep his right leg elevated   Discharge Condition: Stable CODE STATUS: Full code Diet recommendation: Heart healthy, carb modified  Brief/Interim Summary: 75 year old male with history of diabetes mellitus type 2, A. fib on chronic anticoagulation, pancreatic adenocarcinoma, CAD, osteoarthritis, depression, GERD, hyperlipidemia, essential hypertension, peripheral neuropathy and peripheral vascular disease presented with right foot pain and wound with some drainage.  He was admitted for right foot cellulitis and started on IV antibiotics.  MRI showed no osteomyelitis but did show mild fasciitis and cellulitis.  Orthopedics was consulted: Recommended conservative management  Discharge Diagnoses:  Principal Problem:   Cellulitis Active Problems:   DM (diabetes mellitus), type 2 with renal complications ([place])   Hypertension with heart disease   CKD (chronic kidney disease), stage III (HCC)   Hx of CABG   Dyslipidemia, goal LDL below 70   Atrial fibrillation (HCC)   Coronary artery disease of bypass graft of native heart with stable angina pectoris (HCC)   Pancreatic adenocarcinoma (HCC)  Right foot cellulitis with right fifth metatarsal ulcer: Present on admission - MRI showed no osteomyelitis but did show mild fasciitis and cellulitis. -Orthopedics recommends conservative management and outpatient follow-up with orthopedics in 2 weeks -He was treated with broad-spectrum IV antibiotics with vancomycin and cefepime.  Overall swelling, erythema have started to improve.  He is  not febrile and has no leukocytosis.  He is advised to keep his right lower extremity elevated -Right lower extremity duplex ultrasound negative for DVT.  ABI showed abnormal right toe brachial index.  Communicated with Dr. Lissa Morales surgery on 09/25/2021 via secure chat who recommended no need for any further vascular surgery intervention or follow-up.  Chronic A. Fib -Currently rate controlled.  Continue Eliquis.  Outpatient follow-up with cardiology  Hyperlipidemia -Continue statin  Diabetes mellitus type 2 with hyperglycemia -Continue CBGs with SSI.  A1c 8.2.   -Resume home regimen on discharge  BPH -Continue tamsulosin  Anxiety/depression --continue fluoxetine   History of pancreatic adenocarcinoma -Outpatient follow-up with oncology/Dr. Benay Spice   Anemia of chronic disease -Hemoglobin stable.  Monitor  Thrombocytopenia -Monitor.  No signs of bleeding  Coronary artery disease -Currently stable.   Hypertension -Blood pressure was elevated throughout his hospital course -Started on losartan  Discharge Instructions  Discharge Instructions     Diet - low sodium heart healthy   Complete by: As directed    Discharge wound care:   Complete by: As directed    Cleanse right foot wound with NS and pat dry. Apply Xeroform gauze to open wound Cover with gauze and kerlix/tape.  Change daily.   Increase activity slowly   Complete by: As directed       Allergies as of 09/27/2021       Reactions   Furosemide Other (See Comments)   Unknown   Keflex [cephalexin] Other (See Comments)   Hallucinations?   Lisinopril Other (See Comments)   Unknown   Terazosin Other (See Comments)   Unknown        Medication List     STOP taking these medications    Blood Pressure Monitor/L Cuff Misc   clotrimazole-betamethasone  cream Commonly known as: LOTRISONE   FreeStyle Libre Reader [employer] System Misc   lidocaine-prilocaine cream Commonly  known as: EMLA   nystatin powder Commonly known as: MYCOSTATIN/NYSTOP   Engineering geologist Flex System w/Device Kit   prochlorperazine 10 MG tablet Commonly known as: COMPAZINE       TAKE these medications    acetaminophen 325 MG tablet Commonly known as: TYLENOL Take 2 tablets (650 mg total) by mouth every 6 (six) hours as needed for mild pain. What changed: reasons to take this   amoxicillin-clavulanate 875-125 MG tablet Commonly known as: Augmentin Take 1 tablet by mouth 2 (two) times daily for 10 days.   atorvastatin 80 MG tablet Commonly known as: LIPITOR Take 40 mg by mouth at bedtime.   calcium-vitamin D 500-200 MG-UNIT tablet Commonly known as: OSCAL WITH D Take 1 tablet by mouth 2 (two) times daily.   cycloSPORINE 0.05 % ophthalmic emulsion Commonly known as: RESTASIS Place 1 drop into both eyes 2 (two) times daily as needed (dry eyes).   doxycycline 100 MG tablet Commonly known as: VIBRA-TABS Take 1 tablet (100 mg total) by mouth 2 (two) times daily.   Eliquis 5 MG Tabs tablet Generic drug: apixaban Take 5 mg by mouth 2 (two) times daily.   ferrous sulfate 325 (65 FE) MG tablet Take 325 mg by mouth daily with breakfast.   FLUoxetine 20 MG capsule Commonly known as: PROZAC Take 40 mg by mouth daily.   fluticasone 50 MCG/ACT nasal spray Commonly known as: FLONASE INSTILL 2 SPRAYS IN EACH NOSTRIL EVERY EVENING   insulin glargine 100 UNIT/ML injection Commonly known as: LANTUS Inject 0.3 mLs (30 Units total) into the skin daily. What changed:  how much to take when to take this   losartan 50 MG tablet Commonly known as: COZAAR Take 1 tablet (50 mg total) by mouth daily.   Multi-Vitamins Tabs Take 1 tablet by mouth daily.   OneTouch Verio test strip Generic drug: glucose blood Use to test blood sugars 1-2 times daily.   pantoprazole 40 MG tablet Commonly known as: Protonix Take 1 tablet (40 mg total) by mouth  daily.   potassium chloride SA 20 MEQ tablet Commonly known as: KLOR-CON Take 1 tablet (20 mEq total) by mouth daily.   tamsulosin 0.4 MG Caps capsule Commonly known as: FLOMAX Take 0.8 mg by mouth at bedtime.   traZODone 100 MG tablet Commonly known as: DESYREL Take 100 mg by mouth at bedtime.   vitamin B-12 1000 MCG tablet Commonly known as: CYANOCOBALAMIN Take 1,000 mcg by mouth daily.               Discharge Care Instructions  (From admission, onward)           Start     Ordered   09/27/21 0000  Discharge wound care:       Comments: Cleanse right foot wound with NS and pat dry. Apply Xeroform gauze to open wound Cover with gauze and kerlix/tape.  Change daily.   09/27/21 1523            Follow-up Information     Marybelle Killings, MD Follow up in 2 week(s).   Specialty: Orthopedic Surgery Contact information: [place] [address] [phone]         Ladell Pier, MD Follow up.   Specialty: Oncology Why: office will contact you with appointment Contact information: [place]  [place] [address] [phone]                Allergies  Allergen Reactions   Furosemide Other (See Comments)    Unknown   Keflex [Cephalexin] Other (See Comments)    Hallucinations?   Lisinopril Other (See Comments)    Unknown   Terazosin Other (See Comments)    Unknown    Consultations: Orthopedics   Procedures/Studies: MR FOOT RIGHT WO CONTRAST  Result Date: 09/24/2021 CLINICAL DATA:  Diabetic foot ulcer. EXAM: MRI OF THE RIGHT FOREFOOT WITHOUT CONTRAST TECHNIQUE: Multiplanar, multisequence MR imaging of the right foot was performed. No intravenous contrast was administered. COMPARISON:  Radiographs 09/22/2021 FINDINGS: Very limited examination due to patient motion. There is an open wound noted along the lateral aspect of the forefoot at the level of the fifth MTP joint. There is some fluid surrounding the fifth  metatarsal head. I do not however see any definite MR findings for septic arthritis or osteomyelitis. There is diffuse cellulitis and myofasciitis. IMPRESSION: 1. Very limited examination due to patient motion. 2. Open wound along the lateral aspect of the forefoot at the level of the fifth MTP joint. There is some fluid surrounding the fifth metatarsal head but I do not see any definite MR findings for septic arthritis or osteomyelitis. 3. Diffuse cellulitis and myofasciitis. Electronically Signed   By: Marijo Sanes M.D.   On: 09/24/2021 08:14   DG Chest Port 1 View  Result Date: 09/22/2021 CLINICAL DATA:  Questionable sepsis. EXAM: PORTABLE CHEST 1 VIEW COMPARISON:  March 04, 2021 FINDINGS: The heart size and mediastinal contours are stable. Heart size is enlarged. Right central venous line is identified distal tip in the superior vena cava unchanged. Both lungs are clear. The visualized skeletal structures are stable. IMPRESSION: No active cardiopulmonary disease identified. Electronically Signed   By: Abelardo Diesel M.D.   On: 09/22/2021 13:01   DG Foot Complete Right  Result Date: 09/22/2021 CLINICAL DATA:  Diabetic foot ulcer of the distal fifth digit. EXAM: RIGHT FOOT COMPLETE - 3+ VIEW COMPARISON:  None. FINDINGS: There is no evidence of fracture or dislocation. No bony destruction is identified. There is soft tissue swelling of the right fifth digit near the proximal interphalangeal joint. IMPRESSION: No evidence of osteomyelitis. Soft tissue swelling of the right fifth digit. Electronically Signed   By: Abelardo Diesel M.D.   On: 09/22/2021 13:03   VAS [place] ABI WITH/WO TBI  Result Date: 09/25/2021  LOWER EXTREMITY DOPPLER STUDY Patient Name:  [name]  Date of Exam:   09/25/2021 Medical Rec #: [id number]              Accession #:    [id number] Date of Birth: Jul 01, 1946             Patient Gender: M Patient Age:   48 years Exam Location:  [facility] Procedure:      VAS [place] ABI  WITH/WO TBI Referring Phys: Imagene Sheller --------------------------------------------------------------------------------  Indications: Ulceration. High Risk Factors: Hypertension, Diabetes.  Comparison Study: No prior studies. Performing Technologist: Carlos Levering RVT  Examination Guidelines: A complete evaluation includes at minimum, Doppler waveform signals and systolic blood pressure reading at the level of bilateral brachial, anterior tibial, and posterior tibial arteries, when vessel segments are accessible. Bilateral testing is considered an integral part of a complete examination. Photoelectric Plethysmograph (PPG) waveforms and toe systolic pressure readings are included as required and additional duplex testing as needed. Limited examinations  for reoccurring indications may be performed as noted.  ABI Findings: +---------+------------------+-----+---------+--------+ Right    Rt Pressure (mmHg)IndexWaveform Comment  +---------+------------------+-----+---------+--------+ Brachial 153                    triphasic         +---------+------------------+-----+---------+--------+ PTA      222               1.34 biphasic          +---------+------------------+-----+---------+--------+ DP       204               1.23 biphasic          +---------+------------------+-----+---------+--------+ Donalee Citrin               0.69                   +---------+------------------+-----+---------+--------+ +---------+------------------+-----+---------+-------+ Left     Lt Pressure (mmHg)IndexWaveform Comment +---------+------------------+-----+---------+-------+ Brachial 166                    triphasic        +---------+------------------+-----+---------+-------+ PTA      250               1.51 triphasic        +---------+------------------+-----+---------+-------+ DP       241               1.45 triphasic        +---------+------------------+-----+---------+-------+  Great Toe146               0.88                  +---------+------------------+-----+---------+-------+ +-------+-----------+-----------+------------+------------+ ABI/TBIToday's ABIToday's TBIPrevious ABIPrevious TBI +-------+-----------+-----------+------------+------------+ Right  1.34       0.69                                +-------+-----------+-----------+------------+------------+ Left   1.51       0.88                                +-------+-----------+-----------+------------+------------+  Summary: Right: Resting right ankle-brachial index indicates noncompressible right lower extremity arteries. The right toe-brachial index is abnormal. Biphasic waveforms are noted in the dorsalis pedis and posterior tibial arteries. Left: Resting left ankle-brachial index indicates noncompressible left lower extremity arteries. The left toe-brachial index is normal. Triphasic waveforms are noted in the dorsalis pedis and posterior tibial arteries.  *See table(s) above for measurements and observations.  Electronically signed by Harold Barban MD on 09/25/2021 at 9:26:30 PM.    Final    VAS [place] LOWER EXTREMITY VENOUS (DVT)  Result Date: 09/24/2021  Lower Venous DVT Study Patient Name:  [name] [facility]  Date of Exam:   09/24/2021 Medical Rec #: [id number]              Accession #:    [id number] Date of Birth: Apr 13, 1946             Patient Gender: M Patient Age:   52 years Exam Location[facility]  [facility] Procedure:      VAS [place] LOWER EXTREMITY VENOUS (DVT) Referring Phys: Imagene Sheller --------------------------------------------------------------------------------  Indications: Swelling.  Risk Factors: None identified. Limitations: Poor ultrasound/tissue interface. Comparison Study: No prior studies. Performing Technologist: Oliver Hum RVT  Examination Guidelines: A  complete evaluation includes B-mode imaging, spectral Doppler, color Doppler, and power Doppler as needed of all  accessible portions of each vessel. Bilateral testing is considered an integral part of a complete examination. Limited examinations for reoccurring indications may be performed as noted. The reflux portion of the exam is performed with the patient in reverse Trendelenburg.  +---------+---------------+---------+-----------+----------+--------------+ RIGHT    CompressibilityPhasicitySpontaneityPropertiesThrombus Aging +---------+---------------+---------+-----------+----------+--------------+ CFV      Full           Yes      Yes                                 +---------+---------------+---------+-----------+----------+--------------+ SFJ      Full                                                        +---------+---------------+---------+-----------+----------+--------------+ FV Prox  Full                                                        +---------+---------------+---------+-----------+----------+--------------+ FV Mid   Full                                                        +---------+---------------+---------+-----------+----------+--------------+ FV DistalFull                                                        +---------+---------------+---------+-----------+----------+--------------+ PFV      Full                                                        +---------+---------------+---------+-----------+----------+--------------+ POP      Full           Yes      Yes                                 +---------+---------------+---------+-----------+----------+--------------+ PTV      Full                                                        +---------+---------------+---------+-----------+----------+--------------+ PERO     Full                                                        +---------+---------------+---------+-----------+----------+--------------+   +----+---------------+---------+-----------+----------+--------------+  LEFTCompressibilityPhasicitySpontaneityPropertiesThrombus Aging +----+---------------+---------+-----------+----------+--------------+ CFV Full           Yes      Yes                                 +----+---------------+---------+-----------+----------+--------------+    Summary: RIGHT: - There is no evidence of deep vein thrombosis in the lower extremity.  - No cystic structure found in the popliteal fossa.  LEFT: - No evidence of common femoral vein obstruction.  *See table(s) above for measurements and observations. Electronically signed by Servando Snare MD on 09/24/2021 at 1:50:03 PM.    Final       Subjective: He is feeling better.  Overall swelling and erythema reportedly improving.  He feels ready to go home  Discharge Exam: Vitals:   09/26/21 1348 09/26/21 2222 09/27/21 0502 09/27/21 1327  BP: (!) 172/81 (!) 165/87 (!) 175/86 (!) 177/96  Pulse: 90 (!) 108 83 81  Resp: '16 16 16 16  ' Temp: 98.4 F (36.9 C) 98.2 F (36.8 C) 97.9 F (36.6 C) 98.5 F (36.9 C)  TempSrc: Oral Oral Axillary Oral  SpO2: 93% 96% 96% 98%  Weight:   113.2 kg   Height:        General: Pt is alert, awake, not in acute distress Cardiovascular: RRR, S1/S2 +, no rubs, no gallops Respiratory: CTA bilaterally, no wheezing, no rhonchi Abdominal: Soft, NT, ND, bowel sounds + Extremities: Persistent erythema noted in right foot, although overall appears to have improved and no longer tracking up his leg.  No purulent drainage noted from wound.  Overall swelling is better per patient.      The results of significant diagnostics from this hospitalization (including imaging, microbiology, ancillary and laboratory) are listed below for reference.     Microbiology: Recent Results (from the past 240 hour(s))  Blood culture (routine single)     Status: None   Collection Time: 09/22/21 12:20 PM   Specimen: BLOOD  Result Value Ref Range Status   Specimen Description   Final    BLOOD RIGHT PORTA  CATH Performed at [facility], [address] [place], [place] [address]    Special Requests   Final    BOTTLES DRAWN AEROBIC AND ANAEROBIC Blood Culture adequate volume Performed at [facility], [address], [place], [place] [address]    Culture   Final    NO GROWTH 5 DAYS Performed at [facility], [place] [address], [place], [place] [address]    Report Status 09/27/2021 FINAL  Final  Resp Panel by RT-PCR (Flu A&B, Covid) Peripheral     Status: None   Collection Time: 09/22/21 12:24 PM   Specimen: Peripheral; Nasopharyngeal(NP) swabs in vial transport medium  Result Value Ref Range Status   SARS Coronavirus 2 by RT PCR NEGATIVE NEGATIVE Final    Comment: (NOTE) SARS-CoV-2 target nucleic acids are NOT DETECTED.  The SARS-CoV-2 RNA is generally detectable in upper respiratory specimens during the acute phase of infection. The lowest concentration of SARS-CoV-2 viral copies this assay can detect is 138 copies/mL. A negative result does not preclude SARS-Cov-2 infection and should not be used as the sole basis for treatment or other patient management decisions. A negative result may occur with  improper specimen collection/handling, submission of specimen other than nasopharyngeal swab, presence of viral mutation(s) within the areas targeted by this assay, and inadequate number of viral copies(<138 copies/mL). A  negative result must be combined with clinical observations, patient history, and epidemiological information. The expected result is Negative.  Fact Sheet for Patients:  EntrepreneurPulse.com.au  Fact Sheet for Healthcare Providers:  IncredibleEmployment.be  This test is no t yet approved or cleared by the [place] FDA and  has been authorized for detection and/or diagnosis of SARS-CoV-2 by FDA under an Emergency Use Authorization (EUA). This EUA will remain  in effect (meaning  this test can be used) for the duration of the COVID-19 declaration under Section 564(b)(1) of the Act, 21 U.S.C.section 360bbb-3(b)(1), unless the authorization is terminated  or revoked sooner.       Influenza A by PCR NEGATIVE NEGATIVE Final   Influenza B by PCR NEGATIVE NEGATIVE Final    Comment: (NOTE) The Xpert Xpress SARS-CoV-2/FLU/RSV plus assay is intended as an aid in the diagnosis of influenza from Nasopharyngeal swab specimens and should not be used as a sole basis for treatment. Nasal washings and aspirates are unacceptable for Xpert Xpress SARS-CoV-2/FLU/RSV testing.  Fact Sheet for Patients: EntrepreneurPulse.com.au  Fact Sheet for Healthcare Providers: IncredibleEmployment.be  This test is not yet approved or cleared by the [place] FDA and has been authorized for detection and/or diagnosis of SARS-CoV-2 by FDA under an Emergency Use Authorization (EUA). This EUA will remain in effect (meaning this test can be used) for the duration of the COVID-19 declaration under Section 564(b)(1) of the Act, 21 U.S.C. section 360bbb-3(b)(1), unless the authorization is terminated or revoked.  Performed at [facility], [address], [place], [place] [address]      Labs: BNP (last 3 results) No results for input(s): BNP in the last 8760 hours. Basic Metabolic Panel: Recent Labs  Lab 09/23/21 0518 09/24/21 0520 09/25/21 0354 09/26/21 0525 09/27/21 0453  NA 136 137 138 141 134*  K 3.3* 3.6 3.9 3.9 3.5  CL 106 107 109 112* 103  CO2 '25 23 23 24 24  ' GLUCOSE 50* 87 180* 169* 220*  BUN '15 12 12 11 11  ' CREATININE 0.80 0.79 0.59* 0.87 0.89  CALCIUM 8.0* 8.2* 8.2* 8.6* 8.3*  MG  --   --   --  2.0 1.8   Liver Function Tests: Recent Labs  Lab 09/22/21 1157 09/23/21 0518  AST 42* 36  ALT 32 29  ALKPHOS 71 76  BILITOT 0.6 0.7  PROT 5.8* 5.5*  ALBUMIN 3.0* 2.7*   No results for input(s): LIPASE,  AMYLASE in the last 168 hours. No results for input(s): AMMONIA in the last 168 hours. CBC: Recent Labs  Lab 09/22/21 1157 09/23/21 0518 09/24/21 0520 09/25/21 0354 09/26/21 0525 09/27/21 0453  WBC 7.4 7.9 5.0 5.4 6.1 5.8  NEUTROABS 6.3  --  2.2  --  2.7 2.4  HGB 9.8* 9.1* 9.8* 8.8* 9.1* 9.1*  HCT 31.0* 29.2* 30.5* 28.2* 28.6* 29.3*  MCV 84.9 87.2 86.9 87.6 86.7 86.2  PLT 216 199 182 146* 146* 169   Cardiac Enzymes: No results for input(s): CKTOTAL, CKMB, CKMBINDEX, TROPONINI in the last 168 hours. BNP: Invalid input(s): POCBNP CBG: Recent Labs  Lab 09/26/21 1140 09/26/21 1647 09/26/21 2214 09/27/21 0806 09/27/21 1156  GLUCAP 183* 183* 159* 192* 193*   D-Dimer No results for input(s): DDIMER in the last 72 hours. Hgb A1c No results for input(s): HGBA1C in the last 72 hours. Lipid Profile No results for input(s): CHOL, HDL, LDLCALC, TRIG, CHOLHDL, LDLDIRECT in the last 72 hours. Thyroid function studies No results for input(s): TSH, T4TOTAL, T3FREE,  THYROIDAB in the last 72 hours.  Invalid input(s): FREET3 Anemia work up No results for input(s): VITAMINB12, FOLATE, FERRITIN, TIBC, IRON, RETICCTPCT in the last 72 hours. Urinalysis    Component Value Date/Time   COLORURINE YELLOW 03/04/2021 0750   APPEARANCEUR CLEAR 03/04/2021 0750   LABSPEC 1.013 03/04/2021 0750   PHURINE 6.0 03/04/2021 0750   GLUCOSEU NEGATIVE 03/04/2021 0750   HGBUR NEGATIVE 03/04/2021 0750   BILIRUBINUR NEGATIVE 03/04/2021 0750   KETONESUR NEGATIVE 03/04/2021 0750   PROTEINUR NEGATIVE 03/04/2021 0750   NITRITE NEGATIVE 03/04/2021 0750   LEUKOCYTESUR NEGATIVE 03/04/2021 0750   Sepsis Labs Invalid input(s): PROCALCITONIN,  WBC,  LACTICIDVEN Microbiology Recent Results (from the past 240 hour(s))  Blood culture (routine single)     Status: None   Collection Time: 09/22/21 12:20 PM   Specimen: BLOOD  Result Value Ref Range Status   Specimen Description   Final    BLOOD RIGHT PORTA  CATH Performed at [facility], [address], [place], [place] [address]    Special Requests   Final    BOTTLES DRAWN AEROBIC AND ANAEROBIC Blood Culture adequate volume Performed at [facility], [address] [place], [place] [address]    Culture   Final    NO GROWTH 5 DAYS Performed at [facility], [place] [address] [place], [place] [address]    Report Status 09/27/2021 FINAL  Final  Resp Panel by RT-PCR (Flu A&B, Covid) Peripheral     Status: None   Collection Time: 09/22/21 12:24 PM   Specimen: Peripheral; Nasopharyngeal(NP) swabs in vial transport medium  Result Value Ref Range Status   SARS Coronavirus 2 by RT PCR NEGATIVE NEGATIVE Final    Comment: (NOTE) SARS-CoV-2 target nucleic acids are NOT DETECTED.  The SARS-CoV-2 RNA is generally detectable in upper respiratory specimens during the acute phase of infection. The lowest concentration of SARS-CoV-2 viral copies this assay can detect is 138 copies/mL. A negative result does not preclude SARS-Cov-2 infection and should not be used as the sole basis for treatment or other patient management decisions. A negative result may occur with  improper specimen collection/handling, submission of specimen other than nasopharyngeal swab, presence of viral mutation(s) within the areas targeted by this assay, and inadequate number of viral copies(<138 copies/mL). A negative result must be combined with clinical observations, patient history, and epidemiological information. The expected result is Negative.  Fact Sheet for Patients:  EntrepreneurPulse.com.au  Fact Sheet for Healthcare Providers:  IncredibleEmployment.be  This test is no t yet approved or cleared by the [place] FDA and  has been authorized for detection and/or diagnosis of SARS-CoV-2 by FDA under an Emergency Use Authorization (EUA). This EUA will remain  in effect (meaning  this test can be used) for the duration of the COVID-19 declaration under Section 564(b)(1) of the Act, 21 U.S.C.section 360bbb-3(b)(1), unless the authorization is terminated  or revoked sooner.       Influenza A by PCR NEGATIVE NEGATIVE Final   Influenza B by PCR NEGATIVE NEGATIVE Final    Comment: (NOTE) The Xpert Xpress SARS-CoV-2/FLU/RSV plus assay is intended as an aid in the diagnosis of influenza from Nasopharyngeal swab specimens and should not be used as a sole basis for treatment. Nasal washings and aspirates are unacceptable for Xpert Xpress SARS-CoV-2/FLU/RSV testing.  Fact Sheet for Patients: EntrepreneurPulse.com.au  Fact Sheet for Healthcare Providers: IncredibleEmployment.be  This test is not yet approved or cleared by the [place] FDA and has been  authorized for detection and/or diagnosis of SARS-CoV-2 by FDA under an Emergency Use Authorization (EUA). This EUA will remain in effect (meaning this test can be used) for the duration of the COVID-19 declaration under Section 564(b)(1) of the Act, 21 U.S.C. section 360bbb-3(b)(1), unless the authorization is terminated or revoked.  Performed at [facility], [address] [place], [place] [address]      Time coordinating discharge: 51mns  SIGNED:   JKathie Dike MD  [facility] 09/27/2021, 8:41 PM   If 7PM-7AM, please contact night-coverage [email or web address]

## 2021-09-27 NOTE — Care Management Important Message (Signed)
Important Message  Patient Details IM Letter given to the Patient Name: [name] MRN: [id number] Date of Birth: May 17, 1946   Medicare Important Message Given:  Yes     Kerin Salen 09/27/2021, 10:41 AM

## 2021-09-29 ENCOUNTER — Other Ambulatory Visit: Payer: Self-pay | Admitting: Oncology

## 2021-10-01 ENCOUNTER — Inpatient Hospital Stay (HOSPITAL_BASED_OUTPATIENT_CLINIC_OR_DEPARTMENT_OTHER): Payer: Medicare Other | Admitting: Oncology

## 2021-10-01 ENCOUNTER — Inpatient Hospital Stay: Payer: Medicare Other

## 2021-10-01 ENCOUNTER — Telehealth: Payer: Self-pay

## 2021-10-01 ENCOUNTER — Other Ambulatory Visit: Payer: Self-pay

## 2021-10-01 ENCOUNTER — Other Ambulatory Visit: Payer: Self-pay | Admitting: *Deleted

## 2021-10-01 VITALS — BP 152/80 | HR 96 | Temp 98.1°F | Resp 20 | Ht 71.0 in | Wt 245.0 lb

## 2021-10-01 DIAGNOSIS — C251 Malignant neoplasm of body of pancreas: Secondary | ICD-10-CM | POA: Diagnosis not present

## 2021-10-01 DIAGNOSIS — K2960 Other gastritis without bleeding: Secondary | ICD-10-CM

## 2021-10-01 DIAGNOSIS — Z5111 Encounter for antineoplastic chemotherapy: Secondary | ICD-10-CM | POA: Diagnosis not present

## 2021-10-01 DIAGNOSIS — Z5189 Encounter for other specified aftercare: Secondary | ICD-10-CM | POA: Diagnosis not present

## 2021-10-01 LAB — CMP (CANCER CENTER ONLY)
ALT: 28 U/L (ref 0–44)
AST: 40 U/L (ref 15–41)
Albumin: 3.3 g/dL — ABNORMAL LOW (ref 3.5–5.0)
Alkaline Phosphatase: 103 U/L (ref 38–126)
Anion gap: 8 (ref 5–15)
BUN: 15 mg/dL (ref 8–23)
CO2: 26 mmol/L (ref 22–32)
Calcium: 9.2 mg/dL (ref 8.9–10.3)
Chloride: 104 mmol/L (ref 98–111)
Creatinine: 0.82 mg/dL (ref 0.61–1.24)
GFR, Estimated: >60 mL/min (ref >60)
Glucose, Bld: 179 mg/dL — ABNORMAL HIGH (ref 70–99)
Potassium: 3.7 mmol/L (ref 3.5–5.1)
Sodium: 138 mmol/L (ref 135–145)
Total Bilirubin: 0.5 mg/dL (ref 0.3–1.2)
Total Protein: 6.4 g/dL — ABNORMAL LOW (ref 6.5–8.1)

## 2021-10-01 LAB — CBC WITH DIFFERENTIAL (CANCER CENTER ONLY)
Abs Immature Granulocytes: 0.04 10*3/uL (ref 0.00–0.07)
Basophils Absolute: 0 10*3/uL (ref 0.0–0.1)
Basophils Relative: 1 %
Eosinophils Absolute: 0.2 10*3/uL (ref 0.0–0.5)
Eosinophils Relative: 3 %
HCT: 31.6 % — ABNORMAL LOW (ref 39.0–52.0)
Hemoglobin: 9.8 g/dL — ABNORMAL LOW (ref 13.0–17.0)
Immature Granulocytes: 1 %
Lymphocytes Relative: 37 %
Lymphs Abs: 1.8 10*3/uL (ref 0.7–4.0)
MCH: 26.3 pg (ref 26.0–34.0)
MCHC: 31 g/dL (ref 30.0–36.0)
MCV: 84.9 fL (ref 80.0–100.0)
Monocytes Absolute: 1.1 10*3/uL — ABNORMAL HIGH (ref 0.1–1.0)
Monocytes Relative: 23 %
Neutro Abs: 1.7 10*3/uL (ref 1.7–7.7)
Neutrophils Relative %: 35 %
Platelet Count: 479 10*3/uL — ABNORMAL HIGH (ref 150–400)
RBC: 3.72 MIL/uL — ABNORMAL LOW (ref 4.22–5.81)
RDW: 22.1 % — ABNORMAL HIGH (ref 11.5–15.5)
WBC Count: 4.8 10*3/uL (ref 4.0–10.5)
nRBC: 1 % — ABNORMAL HIGH (ref 0.0–0.2)

## 2021-10-01 MED ORDER — HEPARIN SOD (PORK) LOCK FLUSH 100 UNIT/ML IV SOLN
500.0000 [IU] | Freq: Once | INTRAVENOUS | Status: AC | PRN
Start: 2021-10-01 — End: 2021-10-01
  Administered 2021-10-01: 500 [IU]

## 2021-10-01 MED ORDER — SODIUM CHLORIDE 0.9% FLUSH
10.0000 mL | INTRAVENOUS | Status: DC | PRN
  Administered 2021-10-01: 10 mL

## 2021-10-01 MED ORDER — ONDANSETRON HCL 8 MG PO TABS
8.0000 mg | ORAL_TABLET | Freq: Once | ORAL | Status: AC
  Administered 2021-10-01: 8 mg via ORAL
  Filled 2021-10-01: qty 1

## 2021-10-01 MED ORDER — DEXAMETHASONE SODIUM PHOSPHATE 10 MG/ML IJ SOLN
5.0000 mg | Freq: Once | INTRAMUSCULAR | Status: AC
  Administered 2021-10-01: 5 mg via INTRAVENOUS
  Filled 2021-10-01: qty 1

## 2021-10-01 MED ORDER — SODIUM CHLORIDE 0.9 % IV SOLN: Freq: Once | INTRAVENOUS | Status: DC

## 2021-10-01 MED ORDER — SODIUM CHLORIDE 0.9 % IV SOLN: Freq: Once | INTRAVENOUS | Status: AC

## 2021-10-01 MED ORDER — SODIUM CHLORIDE 0.9 % IV SOLN
1000.0000 mg/m2 | Freq: Once | INTRAVENOUS | Status: AC
  Administered 2021-10-01: 2394 mg via INTRAVENOUS
  Filled 2021-10-01: qty 52.6

## 2021-10-01 MED ORDER — PACLITAXEL PROTEIN-BOUND CHEMO INJECTION 100 MG
100.0000 mg/m2 | Freq: Once | INTRAVENOUS | Status: AC
  Administered 2021-10-01: 250 mg via INTRAVENOUS
  Filled 2021-10-01: qty 50

## 2021-10-01 NOTE — Progress Notes (Signed)
[name] OFFICE PROGRESS NOTE   Diagnosis: Pancreas cancer  INTERVAL HISTORY:   [name] was admitted on 09/22/2021 with a right lower extremity cellulitis and right fifth metatarsal ulcer.  He reports trauma to the foot prior to admission.  He completed a course of IV antibiotics while in the hospital and continues Augmentin/doxycycline.  He reports the right leg erythema has improved and the ulcer is healing. He feels well at present. Objective:  Vital signs in last 24 hours:  Blood pressure (!) 152/80, pulse 96, temperature 98.1 F (36.7 C), temperature source Oral, resp. rate 20, height 5\' 11"  (1.803 m), weight 245 lb (111.1 kg), SpO2 98 %.    HEENT: No thrush or ulcers Resp: Lungs clear bilaterally Cardio: Regular rate and rhythm GI: No hepatosplenomegaly, nontender, no mass Vascular: The right lower leg is larger than the left side  Skin: Faint erythema at the distal right lower leg and foot, superficial ulceration at the right fifth metatarsal appears to be healing  Portacath/PICC-without erythema  Lab Results:  Lab Results  Component Value Date   WBC 4.8 10/01/2021   HGB 9.8 (L) 10/01/2021   HCT 31.6 (L) 10/01/2021   MCV 84.9 10/01/2021   PLT 479 (H) 10/01/2021   NEUTROABS 1.7 10/01/2021    CMP  Lab Results  Component Value Date   NA 138 10/01/2021   K 3.7 10/01/2021   CL 104 10/01/2021   CO2 26 10/01/2021   GLUCOSE 179 (H) 10/01/2021   BUN 15 10/01/2021   CREATININE 0.82 10/01/2021   CALCIUM 9.2 10/01/2021   PROT 6.4 (L) 10/01/2021   ALBUMIN 3.3 (L) 10/01/2021   AST 40 10/01/2021   ALT 28 10/01/2021   ALKPHOS 103 10/01/2021   BILITOT 0.5 10/01/2021   GFRNONAA >60 10/01/2021   GFRAA 49 (L) 10/10/2020     Medications: I have reviewed the patient's current medications.   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  No pancreatic ductal dilatation identified.  No definite signs of metastatic disease in the chest, abdomen or pelvis. Cycle 1 gemcitabine/Abraxane 03/01/2021 Cycle 2 gemcitabine/Abraxane 03/14/2021 Cycle 3 gemcitabine/Abraxane 03/28/2021 Cycle 4 gemcitabine/Abraxane 04/17/2021 Cycle 5 gemcitabine/Abraxane 05/01/2021, Zofran/Decadron added Cycle 6 gemcitabine/Abraxane 05/15/2021 CT pancreas protocol 05/27/2021-unchanged 8 mm exophytic low-attenuation lesion at the posterior body of the pancreas 07/01/2021-distal pancreatectomy/splenectomy, 0.8 cm poorly differentiated adenocarcinoma, treatment response score-2, largest single foci of remaining tumor 0.2 cm, negative resection margins, 0/6 lymph nodes,ypT1bypNo, PanIN-1b Cycle 7 gemcitabine/Abraxane 08/14/2021 Cycle 8 gemcitabine/Abraxane 09/04/2021 Cycle 9 gemcitabine/Abraxane 09/18/2021 Diabetes Coronary artery disease Prostate cancer 2013-treated with radiation at [facility] Hypertension Sleep apnea 7.  Coronary artery bypass surgery 2004 8.  Hospital admission 03/04/2021-syncope, A. Fib-started on Eliquis 9.  Mild thrombocytopenia following cycle 1 gemcitabine/Abraxane-resolved 10.  Anemia 11.  Admission 09/22/2021 with a right metatarsal ulcer and right leg cellulitis       Disposition: [name] continues to tolerate the gemcitabine/Abraxane well.  He was admitted last week with a right metatarsal ulcer and right lower  extremity cellulitis.  The ulcer appears to be  healing and the cellulitis is resolving.  He appears stable to proceed with gemcitabine/Abraxane today.  He will receive G-CSF support with this cycle.  [name] will return for an office visit and chemotherapy in 2 weeks.  He is scheduled to follow-up with orthopedics for management of the right foot ulcer.  Betsy Coder, MD  10/01/2021  11:58 AM

## 2021-10-01 NOTE — Patient Instructions (Signed)
[place]  Discharge Instructions: Thank you for choosing [name] to provide your oncology and hematology care.   If you have a lab appointment with the [facility], please go directly to the [facility] and check in at the registration area.   Wear comfortable clothing and clothing appropriate for easy access to any Portacath or PICC line.   We strive to give you quality time with your provider. You may need to reschedule your appointment if you arrive late (15 or more minutes).  Arriving late affects you and other patients whose appointments are after yours.  Also, if you miss three or more appointments without notifying the office, you may be dismissed from the clinic at the provider's discretion.      For prescription refill requests, have your pharmacy contact our office and allow 72 hours for refills to be completed.    Today you received the following chemotherapy and/or immunotherapy agents Abraxane and Gemzar      To help prevent nausea and vomiting after your treatment, we encourage you to take your nausea medication as directed.  BELOW ARE SYMPTOMS THAT SHOULD BE REPORTED IMMEDIATELY: *FEVER GREATER THAN 100.4 F (38 C) OR HIGHER *CHILLS OR SWEATING *NAUSEA AND VOMITING THAT IS NOT CONTROLLED WITH YOUR NAUSEA MEDICATION *UNUSUAL SHORTNESS OF BREATH *UNUSUAL BRUISING OR BLEEDING *URINARY PROBLEMS (pain or burning when urinating, or frequent urination) *BOWEL PROBLEMS (unusual diarrhea, constipation, pain near the anus) TENDERNESS IN MOUTH AND THROAT WITH OR WITHOUT PRESENCE OF ULCERS (sore throat, sores in mouth, or a toothache) UNUSUAL RASH, SWELLING OR PAIN  UNUSUAL VAGINAL DISCHARGE OR ITCHING   Items with * indicate a potential emergency and should be followed up as soon as possible or go to the Emergency Department if any problems should occur.  Please show the CHEMOTHERAPY ALERT CARD or IMMUNOTHERAPY ALERT CARD at  check-in to the Emergency Department and triage nurse.  Should you have questions after your visit or need to cancel or reschedule your appointment, please contact [facility]  Dept: [phone]  and follow the prompts.  Office hours are 8:00 a.m. to 4:30 p.m. Monday - Friday. Please note that voicemails left after 4:00 p.m. may not be returned until the following business day.  We are closed weekends and major holidays. You have access to a nurse at all times for urgent questions. Please call the main number to the clinic Dept: [phone] and follow the prompts.   For any non-urgent questions, you may also contact your provider using MyChart. We now offer e-Visits for anyone 75 and older to request care online for non-urgent symptoms. For details visit [email or web address].   Also download the MyChart app! Go to the app store, search "MyChart", open the app, select [facility], and log in with your MyChart username and password.  Due to Covid, a mask is required upon entering the hospital/clinic. If you do not have a mask, one will be given to you upon arrival. For doctor visits, patients may have 1 support person aged 22 or older with them. For treatment visits, patients cannot have anyone with them due to current Covid guidelines and our immunocompromised population.

## 2021-10-01 NOTE — Telephone Encounter (Signed)
Patient seen by Dr. Sherrill today ? ?Vitals are within treatment parameters. ? ?Labs reviewed by Dr. Sherrill and are within treatment parameters. ? ?Per physician team, patient is ready for treatment and there are NO modifications to the treatment plan.  ?

## 2021-10-01 NOTE — Telephone Encounter (Signed)
Transition Care Management Unsuccessful Follow-up Telephone Call  Date of discharge and from where:  [facility]   Attempts:  1st Attempt  Reason for unsuccessful TCM follow-up call:  Unable to reach patient

## 2021-10-02 ENCOUNTER — Inpatient Hospital Stay: Payer: Medicare Other

## 2021-10-02 VITALS — BP 177/79 | HR 86 | Temp 98.0°F | Resp 18

## 2021-10-02 DIAGNOSIS — Z5189 Encounter for other specified aftercare: Secondary | ICD-10-CM | POA: Diagnosis not present

## 2021-10-02 DIAGNOSIS — K2960 Other gastritis without bleeding: Secondary | ICD-10-CM

## 2021-10-02 DIAGNOSIS — C251 Malignant neoplasm of body of pancreas: Secondary | ICD-10-CM

## 2021-10-02 DIAGNOSIS — Z5111 Encounter for antineoplastic chemotherapy: Secondary | ICD-10-CM | POA: Diagnosis not present

## 2021-10-02 MED ORDER — PEGFILGRASTIM-CBQV 6 MG/0.6ML ~~LOC~~ SOSY
6.0000 mg | PREFILLED_SYRINGE | Freq: Once | SUBCUTANEOUS | Status: AC
  Administered 2021-10-02: 6 mg via SUBCUTANEOUS

## 2021-10-02 NOTE — Progress Notes (Signed)
Chief Complaint  Patient presents with   Follow-up  HPI: [name] is a very pleasant 75 y.o. male with hx of diabetes mellitus type 2, atrial fibrillation on chronic anticoagulation, pancreatic adenocarcinoma on chemo, CAD, osteoarthritis, depression, GERD, hyperlipidemia, essential hypertension, peripheral neuropathy and peripheral vascular disease here today with his wife to follow on recent hospitalization. He was admitted from 09/22/21 to 09/27/21. TCM 2 unsuccessful attempts.  He presented to the ER c/o right foot pain and drainage from wound after fall, rubbed foot against carpet. He developed a bladder containing blister, next day his wife noted a new ulcer. Dx'ed with right foot cellulitis and started on IV antibiotics, vancomycin and cefepime. Foot MRI negative for osteomyelitis. Ortho and vascular consultation during hospitalization, conservative manage recommended. Appt with ortho has been arranged, 10/15/21, Dr Lorin Mercy.   Right lower extremity duplex ultrasound negative for DVT.  ABI showed abnormal right toe brachial index.  His wife if doing the wound care and applying topical medications as instructed. She is reporting that wound is gradually healing, no erythema or drainage.  He is on doxycycline 100 mg twice daily. Tolerated medication well.  Anemia: On chemo treatment for pancreatic adenocarcinoma.   According to patient, he is getting a "shot" a day after chemo to help with [facility] because there was a concern about low WBC's. Negative for fever or chills. Appetite is "good."  Lab Results  Component Value Date   WBC 4.8 10/01/2021   HGB 9.8 (L) 10/01/2021   HCT 31.6 (L) 10/01/2021   MCV 84.9 10/01/2021   PLT 479 (H) 10/01/2021   HTN: Started on Losartan 50 mg daily. Home BP readings: 140-150/?(Not sure about DBP). Negative for CP,SOB,palpitations,or worsening edema.  Lab Results  Component Value Date   CREATININE 0.82 10/01/2021   BUN 15  10/01/2021   NA 138 10/01/2021   K 3.7 10/01/2021   CL 104 10/01/2021   CO2 26 10/01/2021   Diabetes Mellitus II:  - Checking BG at home: fasting BS's 60-200's. High BS's after chemo and for 2-3 days after.He has 2 more chemo cycle left.  - Medications: Lantus 70 U, he has adjusted dose. Skips Lantus when BS's < 100. - Negative for symptoms of hypoglycemia, polyuria, or polydipsia. + Peripheral neuropathy.  Lab Results  Component Value Date   HGBA1C 8.2 (H) 09/23/2021   Lab Results  Component Value Date   MICROALBUR 7.8 10/10/2020   He is using a walker. He fell last night at home. HH was not arranged this time. He would like to try out pt PT.  Constipation: Hard,small daily stools and sometimes with blood after defecation. Hx of hemorrhoids. Colonoscopy in 01/2014 showed sigmoid colon diverticulosis.  Negative for abdominal pain,N/V,dyschezia, or urinary symptoms. He has not noted worsening bruising or color hematuria. He is on chronic anticoagulation, Eliquis 5 mg twice daily.  Not taking OTC laxative. He is on Benefiner. His wife wonders if Probiotics will help.  Review of Systems  Constitutional:  Positive for activity change. Negative for appetite change and fatigue.  HENT:  Negative for mouth sores, nosebleeds and sore throat.   Eyes:  Negative for redness and visual disturbance.  Respiratory:  Negative for cough and wheezing.   Genitourinary:  Negative for decreased urine volume and dysuria.  Musculoskeletal:  Positive for arthralgias and gait problem.  Skin:  Positive for wound. Negative for rash.  Neurological:  Negative for syncope, weakness and headaches.  Psychiatric/Behavioral:  Negative for confusion.  Rest see pertinent positives and negatives per HPI.  Current Outpatient Medications on File Prior to Visit  Medication Sig Dispense Refill   acetaminophen (TYLENOL) 325 MG tablet Take 2 tablets (650 mg total) by mouth every 6 (six) hours as needed for  mild pain. (Patient taking differently: Take 650 mg by mouth every 6 (six) hours as needed for mild pain or fever.)     amoxicillin-clavulanate (AUGMENTIN) 875-125 MG tablet Take 1 tablet by mouth 2 (two) times daily for 10 days. 20 tablet 0   apixaban (ELIQUIS) 5 MG TABS tablet Take 5 mg by mouth 2 (two) times daily.     atorvastatin (LIPITOR) 80 MG tablet Take 40 mg by mouth at bedtime.     calcium-vitamin D (OSCAL WITH D) 500-200 MG-UNIT tablet Take 1 tablet by mouth 2 (two) times daily.     cycloSPORINE (RESTASIS) 0.05 % ophthalmic emulsion Place 1 drop into both eyes 2 (two) times daily as needed (dry eyes).     doxycycline (VIBRA-TABS) 100 MG tablet Take 1 tablet (100 mg total) by mouth 2 (two) times daily. 20 tablet 0   ferrous sulfate 325 (65 FE) MG tablet Take 325 mg by mouth daily with breakfast.     FLUoxetine (PROZAC) 20 MG capsule Take 40 mg by mouth daily.     glucose blood (ONETOUCH VERIO) test strip Use to test blood sugars 1-2 times daily. 200 each 12   insulin glargine (LANTUS) 100 UNIT/ML injection Inject 0.3 mLs (30 Units total) into the skin daily. (Patient taking differently: Inject 50 Units into the skin every morning.) 10 mL 3   losartan (COZAAR) 50 MG tablet Take 1 tablet (50 mg total) by mouth daily. 30 tablet 0   Multiple Vitamin (MULTI-VITAMINS) TABS Take 1 tablet by mouth daily.     pantoprazole (PROTONIX) 40 MG tablet Take 1 tablet (40 mg total) by mouth daily. 90 tablet 3   potassium chloride SA (KLOR-CON) 20 MEQ tablet Take 1 tablet (20 mEq total) by mouth daily. 30 tablet 0   tamsulosin (FLOMAX) 0.4 MG CAPS capsule Take 0.8 mg by mouth at bedtime.     traZODone (DESYREL) 100 MG tablet Take 100 mg by mouth at bedtime.     vitamin B-12 (CYANOCOBALAMIN) 1000 MCG tablet Take 1,000 mcg by mouth daily.     No current facility-administered medications on file prior to visit.   Past Medical History:  Diagnosis Date   A-fib ([place]) 03/04/2021   Anemia    Arthritis     Cancer (HCC)    prostate   Chronic kidney disease    blood in urine    Constipation    Coronary artery disease    Depression    Diabetes mellitus without complication ([place])    Difficult intubation    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 it was hard to get the tube down his throat   Dyspnea    Family history of breast cancer    Fatty liver    Fatty liver    Frequent headaches    GERD (gastroesophageal reflux disease)    History of chicken pox    History of fainting spells of unknown cause    History of prostate cancer    Hyperlipidemia    Hypertension    Myocardial infarction [facility])    Peripheral neuropathy    Pneumonia    Prostate cancer (HCC)    PTSD (post-traumatic stress disorder)    Sleep apnea    uses Cpap  Allergies  Allergen Reactions   Furosemide Other (See Comments)    Unknown   Keflex [Cephalexin] Other (See Comments)    Hallucinations?   Lisinopril Other (See Comments)    Unknown   Terazosin Other (See Comments)    Unknown   Social History   Socioeconomic History   Marital status: Married    Spouse name: Not on file   Number of children: 2   Years of education: Not on file   Highest education level: Not on file  Occupational History   Occupation: retired  Tobacco Use   Smoking status: Former    Types: Cigarettes    Quit date: 12/14/1975    Years since quitting: 45.8   Smokeless tobacco: Never  Vaping Use   Vaping Use: Not on file  Substance and Sexual Activity   Alcohol use: Not Currently   Drug use: Never   Sexual activity: Not Currently  Other Topics Concern   Not on file  Social History Narrative   ** Merged History Encounter **       Social Determinants of Health   Financial Resource Strain: Not on file  Food Insecurity: Not on file  Transportation Needs: Not on file  Physical Activity: Not on file  Stress: Not on file  Social Connections: Not on file   Vitals:   10/04/21 0953  BP: 130/70  Pulse: 100  Resp: 16  SpO2: 97%   Body mass  index is 34.78 kg/m.  Physical Exam Vitals and nursing note reviewed.  Constitutional:      General: He is not in acute distress.    Appearance: He is well-developed.  HENT:     Head: Normocephalic and atraumatic.     Mouth/Throat:     Mouth: Mucous membranes are moist.     Pharynx: Oropharynx is clear.  Eyes:     Conjunctiva/sclera: Conjunctivae normal.  Cardiovascular:     Rate and Rhythm: Normal rate and regular rhythm.     Pulses:          Dorsalis pedis pulses are 2+ on the left side.       Posterior tibial pulses are 2+ on the left side.     Heart sounds: No murmur heard.    Comments: Right foot with bandage and surgical shoe.  DP and PT present. Pulmonary:     Effort: Pulmonary effort is normal. No respiratory distress.     Breath sounds: Normal breath sounds.  Abdominal:     Palpations: Abdomen is soft.     Tenderness: There is no abdominal tenderness.  Lymphadenopathy:     Cervical: No cervical adenopathy.  Skin:    General: Skin is warm.     Findings: No erythema or rash.  Neurological:     Mental Status: He is alert and oriented to person, place, and time.     Cranial Nerves: No cranial nerve deficit.     Comments: Gait assisted with a walker.  Psychiatric:     Comments: Well groomed, good eye contact.   ASSESSMENT AND PLAN:  [name] was seen today for follow-up.  Diagnoses and all orders for this visit: Lab Results  Component Value Date   WBC 38.1 Repeated and verified X2. ([place]) 10/04/2021   HGB 10.2 (L) 10/04/2021   HCT 33.1 (L) 10/04/2021   MCV 86.1 10/04/2021   PLT 408.0 (H) 10/04/2021   Lab Results  Component Value Date   CREATININE 1.01 10/04/2021   BUN 23 10/04/2021  NA 136 10/04/2021   K 4.8 10/04/2021   CL 100 10/04/2021   CO2 27 10/04/2021   Cellulitis of right foot Improving. Treated with IV antibiotics during hospitalization (vancomycin and cefepime). For MRI rule out osteomyelitis.  Complete antibiotic treatment. His wife  is doing wound care as instructed. Keep appointment with orthopedics. Clearly instructed about warning signs.  Anemia, unspecified type Stable. On chemotherapy to treat pancreatic adenocarcinoma. Following with hematologist. Further recommendation will be given according to CBC results.  Frequent falls Fall precautions discussed. Some of his chronic medical problems as well as medications can aggravate problem. PT referral placed.  Constipation, unspecified constipation type Rectal bleeding can be caused by hemorrhoids. Recommend MiraLAX every night as needed. He can start daily probiotic, he may help. Continue Benefiber 1 teaspoon twice daily. Adequate fluid and fiber intake. Instructed about warning signs.  Hypertension with heart disease BP adequately controlled. Continue current management: Losartan 50 mg daily. DASH/low salt diet to continue. Continue monitoring BP at home.  DM (diabetes mellitus), type 2 with renal complications ([place]) IOM3T above at goal. Chemo seems to be aggravating problem, having BS's 60's in between. Recommend continuing Lantus 70 U day of chemo + 2-3 more days and continue 60 U the rest of days. He can decrease Lantus dose to 55 U if still having BS's < 80. No changes in current management. Continue appropriate foot care and eye exams.  Addendum: Lab result discussed with wife, CBC scheduled for this coming Monday. See lab result note.  Return in about 3 months (around 01/04/2022).   Teaira Croft G. [name], MD  [facility]. [place] office.

## 2021-10-02 NOTE — Telephone Encounter (Signed)
Transition Care Management Unsuccessful Follow-up Telephone Call  Date of discharge and from where:  [facility] 09/27/2021  Attempts:  2nd Attempt  Reason for unsuccessful TCM follow-up call:  Unable to leave message

## 2021-10-02 NOTE — Patient Instructions (Signed)

## 2021-10-04 ENCOUNTER — Ambulatory Visit (INDEPENDENT_AMBULATORY_CARE_PROVIDER_SITE_OTHER): Payer: Medicare Other | Admitting: Family Medicine

## 2021-10-04 ENCOUNTER — Encounter: Payer: Self-pay | Admitting: Family Medicine

## 2021-10-04 ENCOUNTER — Other Ambulatory Visit: Payer: Self-pay

## 2021-10-04 ENCOUNTER — Telehealth: Payer: Self-pay

## 2021-10-04 VITALS — BP 130/70 | HR 100 | Resp 16 | Ht 71.0 in | Wt 249.4 lb

## 2021-10-04 DIAGNOSIS — Z794 Long term (current) use of insulin: Secondary | ICD-10-CM | POA: Diagnosis not present

## 2021-10-04 DIAGNOSIS — D72829 Elevated white blood cell count, unspecified: Secondary | ICD-10-CM

## 2021-10-04 DIAGNOSIS — K5900 Constipation, unspecified: Secondary | ICD-10-CM | POA: Diagnosis not present

## 2021-10-04 DIAGNOSIS — N183 Chronic kidney disease, stage 3 unspecified: Secondary | ICD-10-CM | POA: Diagnosis not present

## 2021-10-04 DIAGNOSIS — D649 Anemia, unspecified: Secondary | ICD-10-CM

## 2021-10-04 DIAGNOSIS — L03115 Cellulitis of right lower limb: Secondary | ICD-10-CM | POA: Diagnosis not present

## 2021-10-04 DIAGNOSIS — E1122 Type 2 diabetes mellitus with diabetic chronic kidney disease: Secondary | ICD-10-CM | POA: Diagnosis not present

## 2021-10-04 DIAGNOSIS — R296 Repeated falls: Secondary | ICD-10-CM

## 2021-10-04 DIAGNOSIS — I119 Hypertensive heart disease without heart failure: Secondary | ICD-10-CM

## 2021-10-04 LAB — BASIC METABOLIC PANEL
BUN: 23 mg/dL (ref 6–23)
CO2: 27 mEq/L (ref 19–32)
Calcium: 9.4 mg/dL (ref 8.4–10.5)
Chloride: 100 mEq/L (ref 96–112)
Creatinine, Ser: 1.01 mg/dL (ref 0.40–1.50)
GFR: 72.02 mL/min (ref >60.00)
Glucose, Bld: 181 mg/dL — ABNORMAL HIGH (ref 70–99)
Potassium: 4.8 mEq/L (ref 3.5–5.1)
Sodium: 136 mEq/L (ref 135–145)

## 2021-10-04 LAB — CBC
HCT: 33.1 % — ABNORMAL LOW (ref 39.0–52.0)
Hemoglobin: 10.2 g/dL — ABNORMAL LOW (ref 13.0–17.0)
MCHC: 30.9 g/dL (ref 30.0–36.0)
MCV: 86.1 fl (ref 78.0–100.0)
Platelets: 408 10*3/uL — ABNORMAL HIGH (ref 150.0–400.0)
RBC: 3.85 Mil/uL — ABNORMAL LOW (ref 4.22–5.81)
RDW: 22.4 % — ABNORMAL HIGH (ref 11.5–15.5)
WBC: 38.1 10*3/uL (ref 4.0–10.5)

## 2021-10-04 NOTE — Telephone Encounter (Signed)
CRITICAL VALUE STICKER  CRITICAL VALUE: WBC 38.1  RECEIVER (on-site recipient of call): Mal[place]  DATE & TIME NOTIFIED: 10/04/21 @ 2:50  MESSENGER (representative from lab): [facility]  MD NOTIFIED: Yes  TIME OF NOTIFICATION: 2:55  RESPONSE: MD aware, pt received an injection from his oncologist to boost his WBC count.

## 2021-10-04 NOTE — Telephone Encounter (Signed)
See result note.  

## 2021-10-04 NOTE — Patient Instructions (Addendum)
A few things to remember from today's visit:   Anemia, unspecified type - Plan: CBC  Type 2 diabetes mellitus with stage 3 chronic kidney disease, with long-term current use of insulin, unspecified whether stage 3a or 3b CKD ([place])  Hypertension with heart disease - Plan: Basic metabolic panel  Frequent falls - Plan: Ambulatory referral to Physical Therapy  If you need refills please call your pharmacy. Do not use My Chart to request refills or for acute issues that need immediate attention. Continue Lantus 70 U day of chemo and for 2-3 days after, you can decrease dose to 60 U the rest of days. No changes in rest. Continue wound care right foot and keep it dry. Keep appt with ortho. Complete antibiotic treatment.   Miralax at night as needed. Continue Benefiber. Can try daily probiotic.   Please be sure medication list is accurate. If a new problem present, please set up appointment sooner than planned today.

## 2021-10-04 NOTE — Assessment & Plan Note (Signed)
HgA1C above at goal. Chemo seems to be aggravating problem, having BS's 60's in between. Recommend continuing Lantus 70 U day of chemo + 2-3 more days and continue 60 U the rest of days. He can decrease Lantus dose to 55 U if still having BS's < 80. No changes in current management. Continue appropriate foot care and eye exams.

## 2021-10-04 NOTE — Assessment & Plan Note (Addendum)
BP adequately controlled. Continue current management: Losartan 50 mg daily. DASH/low salt diet to continue. Continue monitoring BP at home.

## 2021-10-07 ENCOUNTER — Other Ambulatory Visit: Payer: Self-pay

## 2021-10-07 ENCOUNTER — Other Ambulatory Visit (INDEPENDENT_AMBULATORY_CARE_PROVIDER_SITE_OTHER): Payer: Medicare Other

## 2021-10-07 DIAGNOSIS — D72829 Elevated white blood cell count, unspecified: Secondary | ICD-10-CM | POA: Diagnosis not present

## 2021-10-07 LAB — CBC WITH DIFFERENTIAL/PLATELET
Basophils Absolute: 0.1 10*3/uL (ref 0.0–0.1)
Basophils Relative: 0.5 % (ref 0.0–3.0)
Eosinophils Absolute: 0.1 10*3/uL (ref 0.0–0.7)
Eosinophils Relative: 0.8 % (ref 0.0–5.0)
HCT: 32.5 % — ABNORMAL LOW (ref 39.0–52.0)
Hemoglobin: 10.1 g/dL — ABNORMAL LOW (ref 13.0–17.0)
Lymphocytes Relative: 18 % (ref 12.0–46.0)
Lymphs Abs: 2.9 10*3/uL (ref 0.7–4.0)
MCHC: 31.1 g/dL (ref 30.0–36.0)
MCV: 85.9 fl (ref 78.0–100.0)
Monocytes Absolute: 1.2 10*3/uL — ABNORMAL HIGH (ref 0.1–1.0)
Monocytes Relative: 7.9 % (ref 3.0–12.0)
Neutro Abs: 11.5 10*3/uL — ABNORMAL HIGH (ref 1.4–7.7)
Neutrophils Relative %: 72.8 % (ref 43.0–77.0)
Platelets: 311 10*3/uL (ref 150.0–400.0)
RBC: 3.78 Mil/uL — ABNORMAL LOW (ref 4.22–5.81)
RDW: 22.3 % — ABNORMAL HIGH (ref 11.5–15.5)
WBC: 15.8 10*3/uL — ABNORMAL HIGH (ref 4.0–10.5)

## 2021-10-07 LAB — C-REACTIVE PROTEIN: CRP: 4.7 mg/dL (ref 0.5–20.0)

## 2021-10-11 ENCOUNTER — Telehealth: Payer: Self-pay | Admitting: Cardiology

## 2021-10-11 MED ORDER — LOSARTAN POTASSIUM 100 MG PO TABS: 100.0000 mg | ORAL_TABLET | Freq: Every day | ORAL | 3 refills | Status: DC

## 2021-10-11 NOTE — Telephone Encounter (Signed)
Pt c/o BP issue: STAT if pt c/o blurred vision, one-sided weakness or slurred speech  1. What are your last 5 BP readings? 157/74; 156/66; 147/72; 156/65e  2. Are you having any other symptoms (ex. Dizziness, headache, blurred vision, passed out)? No   3. What is your BP issue? Experiencing high BP since coming out of hospital 2 weeks ago. Was put on losartan in hospital. Patient is currently taking chemo 1 day every 2 weeks.

## 2021-10-11 NOTE — Telephone Encounter (Signed)
Returned call to patient of Dr. [name]  He is having high BP at home, since hospital discharge, despite being placed losartan 50mg  daily He reports SBP ranging 140s-150s He denies headache, blurry vision; denies pain   Medications come from [facility] -- if refills needed He will need refills of losartan regardless of dose - hospital did not provide refills   Advised will send to MD/pharmacy team to advise on BP/med adjustments if needed

## 2021-10-11 NOTE — Telephone Encounter (Signed)
I would go ahead and increase losartan to 100 mg daily  Bosten Newstrom [name] MD, [facility]

## 2021-10-11 NOTE — Telephone Encounter (Signed)
Spoke with patient's wife and relayed that MD is increasing losartan dose to 100mg   Rx(s) sent to pharmacy electronically.

## 2021-10-13 ENCOUNTER — Other Ambulatory Visit: Payer: Self-pay | Admitting: Oncology

## 2021-10-15 ENCOUNTER — Ambulatory Visit (INDEPENDENT_AMBULATORY_CARE_PROVIDER_SITE_OTHER): Payer: Medicare Other | Admitting: Orthopaedic Surgery

## 2021-10-15 ENCOUNTER — Other Ambulatory Visit: Payer: Self-pay

## 2021-10-15 ENCOUNTER — Encounter: Payer: Self-pay | Admitting: Orthopaedic Surgery

## 2021-10-15 DIAGNOSIS — I25810 Atherosclerosis of coronary artery bypass graft(s) without angina pectoris: Secondary | ICD-10-CM | POA: Diagnosis not present

## 2021-10-15 DIAGNOSIS — L84 Corns and callosities: Secondary | ICD-10-CM

## 2021-10-15 NOTE — Progress Notes (Signed)
Office Visit Note   Patient: [name]           Date of Birth: 1946/05/12           MRN: [id number] Visit Date: 10/15/2021              Requested by: [name], [name] G, MD [address] [place],  [place] [address] PCP: [name], [name] G, MD   Assessment & Plan: Visit Diagnoses:  1. Foot callus     Plan: Foot care discussed with debridement of callus using a pumice stone or file until the tissue is soft.  Wife is present included in the discussion.  At this point he does not need any more antibiotics and can return follow-up with me in 2 months just to make sure he is not having recurrence of callus formation.  Follow-Up Instructions: Return in about 2 months (around 12/15/2021).   Orders:  No orders of the defined types were placed in this encounter.  No orders of the defined types were placed in this encounter.     Procedures: No procedures performed   Clinical Data: No additional findings.   Subjective: Chief Complaint  Patient presents with   Right Foot - Follow-up    HPI 75 year old male returns with problems with a diabetic fifth metatarsal ulcer.  IV antibiotics Issam in consultation there is no evidence of osteomyelitis.  He states is not painful and there is some callus formation present.  Patient has a Port-A-Cath was diagnosed with pancreatic adenocarcinoma.  Review of Systems negative for chills or fever no drainage from the foot.  All other systems noncontributory HPI.   Objective: Vital Signs: BP (!) 149/75   Ht 5\' 11"  (1.803 m)   Wt 249 lb (112.9 kg)   BMI 34.73 kg/m   Physical Exam Constitutional:      Appearance: He is well-developed.  HENT:     Head: Normocephalic and atraumatic.     Right Ear: External ear normal.     Left Ear: External ear normal.  Eyes:     Pupils: Pupils are equal, round, and reactive to light.  Neck:     Thyroid: No thyromegaly.     Trachea: No tracheal deviation.  Cardiovascular:     Rate and  Rhythm: Normal rate.  Pulmonary:     Effort: Pulmonary effort is normal.     Breath sounds: No wheezing.  Abdominal:     General: Bowel sounds are normal.     Palpations: Abdomen is soft.  Musculoskeletal:     Cervical back: Neck supple.  Skin:    General: Skin is warm and dry.     Capillary Refill: Capillary refill takes less than 2 seconds.  Neurological:     Mental Status: He is alert and oriented to person, place, and time.  Psychiatric:        Behavior: Behavior normal.        Thought Content: Thought content normal.        Judgment: Judgment normal.    Ortho Exam fifth metatarsal ulcer is healed and there is some thickened callus which is peeled off today.  No cellulitis. Specialty Comments:  No specialty comments available.  Imaging: No results found.   PMFS History: Patient Active Problem List   Diagnosis Date Noted   Foot callus 10/21/2021   Cellulitis 09/22/2021   Gastritis, 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   Malnutrition of moderate degree 07/02/2021   Pancreatic cancer ([facility]) 07/01/2021   Pancreatic adenocarcinoma ([place])  07/01/2021   Coronary artery disease of bypass graft of native heart with stable angina pectoris ([place]) 06/04/2021   Genetic testing 04/04/2021   History of prostate cancer    Family history of breast cancer    Port-A-Cath in place 03/14/2021   Atrial fibrillation ([place])    Syncope and collapse 03/04/2021   Primary cancer of body of pancreas ([place]) 01/31/2021   Chest pain of uncertain etiology 79/48/0165   Elevated lipase 10/17/2020   H/O agent Orange exposure 10/17/2020   Dyslipidemia, goal LDL below 70 10/17/2020   Hx of CABG 02/28/2020   DM (diabetes mellitus), type 2 with renal complications ([place]) 53/74/8270   Hypertension with heart disease 04/01/2019   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/01/2019   CKD (chronic kidney disease), stage III ([place]) 04/01/2019   Peripheral neuropathy 04/01/2019   Insomnia 11/30/2018   Past Medical History:   Diagnosis Date   A-fib ([place]) 03/04/2021   Anemia    Arthritis    Cancer (HCC)    prostate   Chronic kidney disease    blood in urine    Constipation    Coronary artery disease    Depression    Diabetes mellitus without complication ([place])    Difficult intubation    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 it was hard to get the tube down his throat   Dyspnea    Family history of breast cancer    Fatty liver    Fatty liver    Frequent headaches    GERD (gastroesophageal reflux disease)    History of chicken pox    History of fainting spells of unknown cause    History of prostate cancer    Hyperlipidemia    Hypertension    Myocardial infarction ([place])    Peripheral neuropathy    Pneumonia    Prostate cancer (HCC)    PTSD (post-traumatic stress disorder)    Sleep apnea    uses Cpap    Family History  Problem Relation Age of Onset   Arthritis Mother    Heart disease Mother    Hyperlipidemia Mother    Hypertension Mother    Heart attack Mother    Breast cancer Sister        dx 43s   Heart attack Brother    Heart disease Brother    Depression Daughter    Arthritis Sister    Cancer Sister        unknown type, dx 59s   Cancer Brother    Learning disabilities Brother    Alcohol abuse Brother    COPD Brother    Heart attack Brother    Heart disease Brother     Past Surgical History:  Procedure Laterality Date   APPENDECTOMY     BIOPSY  01/15/2021   Procedure: BIOPSY;  Surgeon: Mansouraty, Telford Nab., MD;  Location: [facility] ENDOSCOPY;  Service: Gastroenterology;;   CARDIAC CATHETERIZATION  06/26/2017   CARDIAC SURGERY     Triple Bypass   CHOLECYSTECTOMY  2010   COLONOSCOPY     CORONARY ARTERY BYPASS GRAFT  2004   ESOPHAGOGASTRODUODENOSCOPY (EGD) WITH PROPOFOL N/A 01/15/2021   Procedure: ESOPHAGOGASTRODUODENOSCOPY (EGD) WITH PROPOFOL;  Surgeon: Irving Copas., MD;  Location: [facility] ENDOSCOPY;  Service: Gastroenterology;  Laterality: N/A;   EUS N/A 01/15/2021   Procedure: UPPER  ENDOSCOPIC ULTRASOUND (EUS) RADIAL;  Surgeon: Irving Copas., MD;  Location: [facility];  Service: Gastroenterology;  Laterality: N/A;   EYE SURGERY Bilateral    cataract removal  FINE NEEDLE ASPIRATION  01/15/2021   Procedure: FINE NEEDLE ASPIRATION (FNA) LINEAR;  Surgeon: Irving Copas., MD;  Location: [facility];  Service: Gastroenterology;;   LAPAROSCOPY N/A 07/01/2021   Procedure: STAGING LAPAROSCOPY;  Surgeon: Dwan Bolt, MD;  Location: [place];  Service: General;  Laterality: N/A;   PORTACATH PLACEMENT Right 02/21/2021   Procedure: INSERTION PORT-A-CATH;  Surgeon: Dwan Bolt, MD;  Location: [facility];  Service: General;  Laterality: Right;   SPLENECTOMY, 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   Procedure: SPLENECTOMY;  Surgeon: Dwan Bolt, MD;  Location: [facility];  Service: General;  Laterality: N/A;   TONSILLECTOMY  1958   Social History   Occupational History   Occupation: retired  Tobacco Use   Smoking status: Former    Types: Cigarettes    Quit date: 12/14/1975    Years since quitting: 45.8   Smokeless tobacco: Never  Vaping Use   Vaping Use: Not on file  Substance and Sexual Activity   Alcohol use: Not Currently   Drug use: Never   Sexual activity: Not Currently

## 2021-10-16 ENCOUNTER — Inpatient Hospital Stay: Payer: Medicare Other

## 2021-10-16 ENCOUNTER — Inpatient Hospital Stay (HOSPITAL_BASED_OUTPATIENT_CLINIC_OR_DEPARTMENT_OTHER): Payer: Medicare Other | Admitting: Oncology

## 2021-10-16 ENCOUNTER — Inpatient Hospital Stay: Payer: Medicare Other | Attending: Nurse Practitioner

## 2021-10-16 ENCOUNTER — Telehealth: Payer: Self-pay

## 2021-10-16 VITALS — BP 138/75 | HR 88 | Temp 98.1°F | Resp 20 | Ht 71.0 in | Wt 245.0 lb

## 2021-10-16 DIAGNOSIS — Z5189 Encounter for other specified aftercare: Secondary | ICD-10-CM | POA: Diagnosis not present

## 2021-10-16 DIAGNOSIS — Z5111 Encounter for antineoplastic chemotherapy: Secondary | ICD-10-CM | POA: Insufficient documentation

## 2021-10-16 DIAGNOSIS — C251 Malignant neoplasm of body of pancreas: Secondary | ICD-10-CM

## 2021-10-16 DIAGNOSIS — C250 Malignant neoplasm of head of pancreas: Secondary | ICD-10-CM | POA: Diagnosis not present

## 2021-10-16 DIAGNOSIS — K2960 Other gastritis without bleeding: Secondary | ICD-10-CM

## 2021-10-16 LAB — CBC WITH DIFFERENTIAL (CANCER CENTER ONLY)
Abs Immature Granulocytes: 0.09 10*3/uL — ABNORMAL HIGH (ref 0.00–0.07)
Basophils Absolute: 0 10*3/uL (ref 0.0–0.1)
Basophils Relative: 0 %
Eosinophils Absolute: 0.2 10*3/uL (ref 0.0–0.5)
Eosinophils Relative: 2 %
HCT: 32.8 % — ABNORMAL LOW (ref 39.0–52.0)
Hemoglobin: 10.2 g/dL — ABNORMAL LOW (ref 13.0–17.0)
Immature Granulocytes: 1 %
Lymphocytes Relative: 22 %
Lymphs Abs: 2.1 10*3/uL (ref 0.7–4.0)
MCH: 27.8 pg (ref 26.0–34.0)
MCHC: 31.1 g/dL (ref 30.0–36.0)
MCV: 89.4 fL (ref 80.0–100.0)
Monocytes Absolute: 1.6 10*3/uL — ABNORMAL HIGH (ref 0.1–1.0)
Monocytes Relative: 16 %
Neutro Abs: 5.8 10*3/uL (ref 1.7–7.7)
Neutrophils Relative %: 59 %
Platelet Count: 225 10*3/uL (ref 150–400)
RBC: 3.67 MIL/uL — ABNORMAL LOW (ref 4.22–5.81)
RDW: 24.8 % — ABNORMAL HIGH (ref 11.5–15.5)
WBC Count: 9.8 10*3/uL (ref 4.0–10.5)
nRBC: 2.9 % — ABNORMAL HIGH (ref 0.0–0.2)

## 2021-10-16 LAB — CMP (CANCER CENTER ONLY)
ALT: 12 U/L (ref 0–44)
AST: 24 U/L (ref 15–41)
Albumin: 3.1 g/dL — ABNORMAL LOW (ref 3.5–5.0)
Alkaline Phosphatase: 101 U/L (ref 38–126)
Anion gap: 7 (ref 5–15)
BUN: 19 mg/dL (ref 8–23)
CO2: 25 mmol/L (ref 22–32)
Calcium: 8.5 mg/dL — ABNORMAL LOW (ref 8.9–10.3)
Chloride: 106 mmol/L (ref 98–111)
Creatinine: 1.02 mg/dL (ref 0.61–1.24)
GFR, Estimated: >60 mL/min (ref >60)
Glucose, Bld: 180 mg/dL — ABNORMAL HIGH (ref 70–99)
Potassium: 4.2 mmol/L (ref 3.5–5.1)
Sodium: 138 mmol/L (ref 135–145)
Total Bilirubin: 0.4 mg/dL (ref 0.3–1.2)
Total Protein: 6.3 g/dL — ABNORMAL LOW (ref 6.5–8.1)

## 2021-10-16 MED ORDER — SODIUM CHLORIDE 0.9% FLUSH
10.0000 mL | INTRAVENOUS | Status: DC | PRN
  Administered 2021-10-16: 10 mL

## 2021-10-16 MED ORDER — PACLITAXEL PROTEIN-BOUND CHEMO INJECTION 100 MG
100.0000 mg/m2 | Freq: Once | INTRAVENOUS | Status: AC
  Administered 2021-10-16: 250 mg via INTRAVENOUS
  Filled 2021-10-16: qty 50

## 2021-10-16 MED ORDER — SODIUM CHLORIDE 0.9 % IV SOLN: Freq: Once | INTRAVENOUS | Status: AC

## 2021-10-16 MED ORDER — SODIUM CHLORIDE 0.9 % IV SOLN
1000.0000 mg/m2 | Freq: Once | INTRAVENOUS | Status: AC
  Administered 2021-10-16: 2394 mg via INTRAVENOUS
  Filled 2021-10-16: qty 52.6

## 2021-10-16 MED ORDER — HEPARIN SOD (PORK) LOCK FLUSH 100 UNIT/ML IV SOLN
500.0000 [IU] | Freq: Once | INTRAVENOUS | Status: AC | PRN
  Administered 2021-10-16: 500 [IU]

## 2021-10-16 MED ORDER — DEXAMETHASONE SODIUM PHOSPHATE 10 MG/ML IJ SOLN
5.0000 mg | Freq: Once | INTRAMUSCULAR | Status: AC
  Administered 2021-10-16: 5 mg via INTRAVENOUS
  Filled 2021-10-16: qty 1

## 2021-10-16 MED ORDER — ONDANSETRON HCL 8 MG PO TABS
8.0000 mg | ORAL_TABLET | Freq: Once | ORAL | Status: AC
  Administered 2021-10-16: 8 mg via ORAL
  Filled 2021-10-16: qty 1

## 2021-10-16 NOTE — Progress Notes (Signed)
[facility] OFFICE PROGRESS NOTE   Diagnosis: Pancreas cancer  INTERVAL HISTORY:   Mr. Damon completed another cycle of gemcitabine/Abraxane on 10/01/2021.  He received G-CSF on 10/02/2021.  No bone pain following the Uden[place].  No fever.  The right leg erythema has improved.  He reports weakness in the right hand since beginning chemotherapy.  He has numbness in the right hand as well.  No other numbness.  Objective:  Vital signs in last 24 hours:  Blood pressure 138/75, pulse 88, temperature 98.1 F (36.7 C), temperature source Oral, resp. rate 20, height 5\' 11"  (1.803 m), weight 245 lb (111.1 kg), SpO2 100 %.    HEENT: No thrush or ulcers Resp: Lungs clear bilaterally Cardio: Regular rate and rhythm GI: No hepatosplenomegaly, nontender Vascular: Trace edema at the right greater than left lower leg with faint erythema at the right lower leg Neuro: He is able to button his shirt    Portacath/PICC-without erythema  Lab Results:  Lab Results  Component Value Date   WBC 9.8 10/16/2021   HGB 10.2 (L) 10/16/2021   HCT 32.8 (L) 10/16/2021   MCV 89.4 10/16/2021   PLT 225 10/16/2021   NEUTROABS 5.8 10/16/2021    CMP  Lab Results  Component Value Date   NA 136 10/04/2021   K 4.8 10/04/2021   CL 100 10/04/2021   CO2 27 10/04/2021   GLUCOSE 181 (H) 10/04/2021   BUN 23 10/04/2021   CREATININE 1.01 10/04/2021   CALCIUM 9.4 10/04/2021   PROT 6.4 (L) 10/01/2021   ALBUMIN 3.3 (L) 10/01/2021   AST 40 10/01/2021   ALT 28 10/01/2021   ALKPHOS 103 10/01/2021   BILITOT 0.5 10/01/2021   GFRNONAA >60 10/01/2021   GFRAA 49 (L) 10/10/2020    Medications: I have reviewed the patient's current medications.   Assessment/Plan: Pancreas cancer-poorly differentiated carcinoma on FNA biopsy of a pancreas mass 01/15/2021 MRI abdomen 12/20/2020-loss of continuity of the pancreatic duct in the mid pancreas body with mild upstream dilatation, no discrete lesion  identified, left adrenal adenoma, small cystic pancreas lesion-intraductal papillary mucinous tumor? 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  No pancreatic ductal dilatation identified.  No definite signs of metastatic disease in the chest, abdomen or pelvis. Cycle 1 gemcitabine/Abraxane 03/01/2021 Cycle 2 gemcitabine/Abraxane 03/14/2021 Cycle 3 gemcitabine/Abraxane 03/28/2021 Cycle 4 gemcitabine/Abraxane 04/17/2021 Cycle 5 gemcitabine/Abraxane 05/01/2021, Zofran/Decadron added Cycle 6 gemcitabine/Abraxane 05/15/2021 CT pancreas protocol 05/27/2021-unchanged 8 mm exophytic low-attenuation lesion at the posterior body of the pancreas 07/01/2021-distal pancreatectomy/splenectomy, 0.8 cm poorly differentiated adenocarcinoma, treatment response score-2, largest single foci of remaining tumor 0.2 cm, negative resection margins, 0/6 lymph nodes,ypT1bypNo, PanIN-1b Cycle 7 gemcitabine/Abraxane 08/14/2021 Cycle 8 gemcitabine/Abraxane 09/04/2021 Cycle 9 gemcitabine/Abraxane 09/18/2021 Cycle 10 gemcitabine/Abraxane 10/01/2021 Cycle 11 gemcitabine/Abraxane 10/16/2021 Diabetes Coronary artery disease Prostate cancer 2013-treated with radiation at [facility] Hypertension Sleep apnea 7.  Coronary artery bypass surgery 2004 8.  Hospital admission 03/04/2021-syncope, A. Fib-started on Eliquis 9.  Mild thrombocytopenia following cycle 1 gemcitabine/Abraxane-resolved 10.  Anemia 11.  Admission 09/22/2021 with a right metatarsal ulcer and right leg cellulitis         Disposition: Mr. Demartin appears stable.  He will complete another treatment with gemcitabine/Abraxane today.  He will return for an office visit and the final cycle  of chemotherapy in 2 weeks.  We will  hold G-CSF with the final cycle of chemotherapy if the white count is adequate. He reports weakness of the right hand since beginning chemotherapy earlier this year.  I doubt the hand weakness and mild numbness is related to Abraxane neuropathy, but this is possible.  We will hold Abraxane from the final cycle of chemotherapy if the neurologic symptoms worsen.   Betsy Coder, MD  10/16/2021  9:27 AM

## 2021-10-16 NOTE — Telephone Encounter (Signed)
Patient seen by Dr. Sherrill today ? ?Vitals are within treatment parameters. ? ?Labs reviewed by Dr. Sherrill and are within treatment parameters. ? ?Per physician team, patient is ready for treatment and there are NO modifications to the treatment plan.  ?

## 2021-10-16 NOTE — Patient Instructions (Signed)
[facility]   Discharge Instructions: Thank you for choosing [facility] to provide your oncology and hematology care.   If you have a lab appointment with the [facility], please go directly to the [facility] and check in at the registration area.   Wear comfortable clothing and clothing appropriate for easy access to any Portacath or PICC line.   We strive to give you quality time with your provider. You may need to reschedule your appointment if you arrive late (15 or more minutes).  Arriving late affects you and other patients whose appointments are after yours.  Also, if you miss three or more appointments without notifying the office, you may be dismissed from the clinic at the provider's discretion.      For prescription refill requests, have your pharmacy contact our office and allow 72 hours for refills to be completed.    Today you received the following chemotherapy and/or immunotherapy agents Paclitaxel-protein bound (ABRAXANE) & Gemcitabine (GEMZAR).      To help prevent nausea and vomiting after your treatment, we encourage you to take your nausea medication as directed.  BELOW ARE SYMPTOMS THAT SHOULD BE REPORTED IMMEDIATELY: *FEVER GREATER THAN 100.4 F (38 C) OR HIGHER *CHILLS OR SWEATING *NAUSEA AND VOMITING THAT IS NOT CONTROLLED WITH YOUR NAUSEA MEDICATION *UNUSUAL SHORTNESS OF BREATH *UNUSUAL BRUISING OR BLEEDING *URINARY PROBLEMS (pain or burning when urinating, or frequent urination) *BOWEL PROBLEMS (unusual diarrhea, constipation, pain near the anus) TENDERNESS IN MOUTH AND THROAT WITH OR WITHOUT PRESENCE OF ULCERS (sore throat, sores in mouth, or a toothache) UNUSUAL RASH, SWELLING OR PAIN  UNUSUAL VAGINAL DISCHARGE OR ITCHING   Items with * indicate a potential emergency and should be followed up as soon as possible or go to the Emergency Department if any problems should occur.  Please show the CHEMOTHERAPY ALERT  CARD or IMMUNOTHERAPY ALERT CARD at check-in to the Emergency Department and triage nurse.  Should you have questions after your visit or need to cancel or reschedule your appointment, please contact [facility]  Dept: [phone]  and follow the prompts.  Office hours are 8:00 a.m. to 4:30 p.m. Monday - Friday. Please note that voicemails left after 4:00 p.m. may not be returned until the following business day.  We are closed weekends and major holidays. You have access to a nurse at all times for urgent questions. Please call the main number to the clinic Dept: [phone] and follow the prompts.   For any non-urgent questions, you may also contact your provider using MyChart. We now offer e-Visits for anyone 54 and older to request care online for non-urgent symptoms. For details visit [email or web address].   Also download the MyChart app! Go to the app store, search "MyChart", open the app, select [facility], and log in with your MyChart username and password.  Due to Covid, a mask is required upon entering the hospital/clinic. If you do not have a mask, one will be given to you upon arrival. For doctor visits, patients may have 1 support person aged 27 or older with them. For treatment visits, patients cannot have anyone with them due to current Covid guidelines and our immunocompromised population.   Nanoparticle Albumin-Bound Paclitaxel injection What is this medication? NANOPARTICLE ALBUMIN-BOUND PACLITAXEL (Na no PAHR ti kuhl al BYOO muhn-bound PAK li TAX el) is a chemotherapy drug. It targets fast dividing cells, like cancer cells, and causes these cells to die. This medicine  is used to treat advanced breast cancer, lung cancer, and pancreatic cancer. This medicine may be used for other purposes; ask your health care provider or pharmacist if you have questions. COMMON BRAND NAME(S): Abraxane What should I tell my care team before I take this  medication? They need to know if you have any of these conditions: kidney disease liver disease low blood counts, like low white cell, platelet, or red cell counts lung or breathing disease, like asthma tingling of the fingers or toes, or other nerve disorder an unusual or allergic reaction to paclitaxel, albumin, other chemotherapy, other medicines, foods, dyes, or preservatives pregnant or trying to get pregnant breast-feeding How should I use this medication? This drug is given as an infusion into a vein. It is administered in a hospital or clinic by a specially trained health care professional. Talk to your pediatrician regarding the use of this medicine in children. Special care may be needed. Overdosage: If you think you have taken too much of this medicine contact a poison control center or emergency room at once. NOTE: This medicine is only for you. Do not share this medicine with others. What if I miss a dose? It is important not to miss your dose. Call your doctor or health care professional if you are unable to keep an appointment. What may interact with this medication? This medicine may interact with the following medications: antiviral medicines for hepatitis, HIV or AIDS certain antibiotics like erythromycin and clarithromycin certain medicines for fungal infections like ketoconazole and itraconazole certain medicines for seizures like carbamazepine, phenobarbital, phenytoin gemfibrozil nefazodone rifampin St. John's wort This list may not describe all possible interactions. Give your health care provider a list of all the medicines, herbs, non-prescription drugs, or dietary supplements you use. Also tell them if you smoke, drink alcohol, or use illegal drugs. Some items may interact with your medicine. What should I watch for while using this medication? Your condition will be monitored carefully while you are receiving this medicine. You will need important blood work  done while you are taking this medicine. This medicine can cause serious allergic reactions. If you experience allergic reactions like skin rash, itching or hives, swelling of the face, lips, or tongue, tell your doctor or health care professional right away. In some cases, you may be given additional medicines to help with side effects. Follow all directions for their use. This drug may make you feel generally unwell. This is not uncommon, as chemotherapy can affect healthy cells as well as cancer cells. Report any side effects. Continue your course of treatment even though you feel ill unless your doctor tells you to stop. Call your doctor or health care professional for advice if you get a fever, chills or sore throat, or other symptoms of a cold or flu. Do not treat yourself. This drug decreases your body's ability to fight infections. Try to avoid being around people who are sick. This medicine may increase your risk to bruise or bleed. Call your doctor or health care professional if you notice any unusual bleeding. Be careful brushing and flossing your teeth or using a toothpick because you may get an infection or bleed more easily. If you have any dental work done, tell your dentist you are receiving this medicine. Avoid taking products that contain aspirin, acetaminophen, ibuprofen, naproxen, or ketoprofen unless instructed by your doctor. These medicines may hide a fever. Do not become pregnant while taking this medicine or for 6 months after stopping it.  Women should inform their doctor if they wish to become pregnant or think they might be pregnant. Men should not father a child while taking this medicine or for 3 months after stopping it. There is a potential for serious side effects to an unborn child. Talk to your health care professional or pharmacist for more information. Do not breast-feed an infant while taking this medicine or for 2 weeks after stopping it. This medicine may interfere  with the ability to get pregnant or to father a child. You should talk to your doctor or health care professional if you are concerned about your fertility. What side effects may I notice from receiving this medication? Side effects that you should report to your doctor or health care professional as soon as possible: allergic reactions like skin rash, itching or hives, swelling of the face, lips, or tongue breathing problems changes in vision fast, irregular heartbeat low blood pressure mouth sores pain, tingling, numbness in the hands or feet signs of decreased platelets or bleeding - bruising, pinpoint red spots on the skin, black, tarry stools, blood in the urine signs of decreased red blood cells - unusually weak or tired, feeling faint or lightheaded, falls signs of infection - fever or chills, cough, sore throat, pain or difficulty passing urine signs and symptoms of liver injury like dark yellow or brown urine; general ill feeling or flu-like symptoms; light-colored stools; loss of appetite; nausea; right upper belly pain; unusually weak or tired; yellowing of the eyes or skin swelling of the ankles, feet, hands unusually slow heartbeat Side effects that usually do not require medical attention (report to your doctor or health care professional if they continue or are bothersome): diarrhea hair loss loss of appetite nausea, vomiting tiredness This list may not describe all possible side effects. Call your doctor for medical advice about side effects. You may report side effects to FDA at 1-800-FDA-1088. Where should I keep my medication? This drug is given in a hospital or clinic and will not be stored at home. NOTE: This sheet is a summary. It may not cover all possible information. If you have questions about this medicine, talk to your doctor, pharmacist, or health care provider.  2022 [employer]/Gold Standard (2017-08-04 13:03:45)  Gemcitabine injection What is this  medication? GEMCITABINE (jem SYE ta been) is a chemotherapy drug. This medicine is used to treat many types of cancer like breast cancer, lung cancer, pancreatic cancer, and ovarian cancer. This medicine may be used for other purposes; ask your health care provider or pharmacist if you have questions. COMMON BRAND NAME(S): Gemzar, Infugem What should I tell my care team before I take this medication? They need to know if you have any of these conditions: blood disorders infection kidney disease liver disease lung or breathing disease, like asthma recent or ongoing radiation therapy an unusual or allergic reaction to gemcitabine, other chemotherapy, other medicines, foods, dyes, or preservatives pregnant or trying to get pregnant breast-feeding How should I use this medication? This drug is given as an infusion into a vein. It is administered in a hospital or clinic by a specially trained health care professional. Talk to your pediatrician regarding the use of this medicine in children. Special care may be needed. Overdosage: If you think you have taken too much of this medicine contact a poison control center or emergency room at once. NOTE: This medicine is only for you. Do not share this medicine with others. What if I miss a dose? It is  important not to miss your dose. Call your doctor or health care professional if you are unable to keep an appointment. What may interact with this medication? medicines to increase blood counts like filgrastim, pegfilgrastim, sargramostim some other chemotherapy drugs like cisplatin vaccines Talk to your doctor or health care professional before taking any of these medicines: acetaminophen aspirin ibuprofen ketoprofen naproxen This list may not describe all possible interactions. Give your health care provider a list of all the medicines, herbs, non-prescription drugs, or dietary supplements you use. Also tell them if you smoke, drink alcohol, or  use illegal drugs. Some items may interact with your medicine. What should I watch for while using this medication? Visit your doctor for checks on your progress. This drug may make you feel generally unwell. This is not uncommon, as chemotherapy can affect healthy cells as well as cancer cells. Report any side effects. Continue your course of treatment even though you feel ill unless your doctor tells you to stop. In some cases, you may be given additional medicines to help with side effects. Follow all directions for their use. Call your doctor or health care professional for advice if you get a fever, chills or sore throat, or other symptoms of a cold or flu. Do not treat yourself. This drug decreases your body's ability to fight infections. Try to avoid being around people who are sick. This medicine may increase your risk to bruise or bleed. Call your doctor or health care professional if you notice any unusual bleeding. Be careful brushing and flossing your teeth or using a toothpick because you may get an infection or bleed more easily. If you have any dental work done, tell your dentist you are receiving this medicine. Avoid taking products that contain aspirin, acetaminophen, ibuprofen, naproxen, or ketoprofen unless instructed by your doctor. These medicines may hide a fever. Do not become pregnant while taking this medicine or for 6 months after stopping it. Women should inform their doctor if they wish to become pregnant or think they might be pregnant. Men should not father a child while taking this medicine and for 3 months after stopping it. There is a potential for serious side effects to an unborn child. Talk to your health care professional or pharmacist for more information. Do not breast-feed an infant while taking this medicine or for at least 1 week after stopping it. Men should inform their doctors if they wish to father a child. This medicine may lower sperm counts. Talk with your  doctor or health care professional if you are concerned about your fertility. What side effects may I notice from receiving this medication? Side effects that you should report to your doctor or health care professional as soon as possible: allergic reactions like skin rash, itching or hives, swelling of the face, lips, or tongue breathing problems pain, redness, or irritation at site where injected signs and symptoms of a dangerous change in heartbeat or heart rhythm like chest pain; dizziness; fast or irregular heartbeat; palpitations; feeling faint or lightheaded, falls; breathing problems signs of decreased platelets or bleeding - bruising, pinpoint red spots on the skin, black, tarry stools, blood in the urine signs of decreased red blood cells - unusually weak or tired, feeling faint or lightheaded, falls signs of infection - fever or chills, cough, sore throat, pain or difficulty passing urine signs and symptoms of kidney injury like trouble passing urine or change in the amount of urine signs and symptoms of liver injury like dark  yellow or brown urine; general ill feeling or flu-like symptoms; light-colored stools; loss of appetite; nausea; right upper belly pain; unusually weak or tired; yellowing of the eyes or skin swelling of ankles, feet, hands Side effects that usually do not require medical attention (report to your doctor or health care professional if they continue or are bothersome): constipation diarrhea hair loss loss of appetite nausea rash vomiting This list may not describe all possible side effects. Call your doctor for medical advice about side effects. You may report side effects to FDA at 1-800-FDA-1088. Where should I keep my medication? This drug is given in a hospital or clinic and will not be stored at home. NOTE: This sheet is a summary. It may not cover all possible information. If you have questions about this medicine, talk to your doctor, pharmacist, or  health care provider.  2022 Elsevier/Gold Standard (2018-02-24 18:06:11)

## 2021-10-16 NOTE — Progress Notes (Signed)
Patient presents for treatment. RN assessment completed along with the following:  Labs/vitals reviewed - Yes, and within treatment parameters.   Weight within 10% of previous measurement - Yes Oncology Treatment Attestation completed for current therapy- Yes, on date 02/15/2021 Informed consent completed and reflects current therapy/intent - Yes, on date 03/01/2021             Provider progress note reviewed - Yes, today's provider note was reviewed. Treatment/Antibody/Supportive plan reviewed - Yes, and there are no adjustments needed for today's treatment. S&H and other orders reviewed - Yes, and there are no additional orders identified. Previous treatment date reviewed - Yes, and the appropriate amount of time has elapsed between treatments. Clinic Hand Off Received from - No.  Patient to proceed with treatment.

## 2021-10-17 ENCOUNTER — Other Ambulatory Visit: Payer: Self-pay

## 2021-10-17 ENCOUNTER — Encounter: Payer: Self-pay | Admitting: Family Medicine

## 2021-10-17 ENCOUNTER — Inpatient Hospital Stay: Payer: Medicare Other

## 2021-10-17 VITALS — BP 132/68 | HR 81 | Temp 98.1°F | Resp 18

## 2021-10-17 DIAGNOSIS — C251 Malignant neoplasm of body of pancreas: Secondary | ICD-10-CM | POA: Diagnosis not present

## 2021-10-17 DIAGNOSIS — Z5189 Encounter for other specified aftercare: Secondary | ICD-10-CM | POA: Diagnosis not present

## 2021-10-17 DIAGNOSIS — K2960 Other gastritis without bleeding: Secondary | ICD-10-CM

## 2021-10-17 DIAGNOSIS — Z5111 Encounter for antineoplastic chemotherapy: Secondary | ICD-10-CM | POA: Diagnosis not present

## 2021-10-17 MED ORDER — PEGFILGRASTIM-CBQV 6 MG/0.6ML ~~LOC~~ SOSY
6.0000 mg | PREFILLED_SYRINGE | Freq: Once | SUBCUTANEOUS | Status: AC
  Administered 2021-10-17: 6 mg via SUBCUTANEOUS

## 2021-10-17 NOTE — Patient Instructions (Signed)
[facility]  Discharge Instructions: Thank you for choosing [facility] to provide your oncology and hematology care.   If you have a lab appointment with the [facility], please go directly to the [facility] and check in at the registration area.   Wear comfortable clothing and clothing appropriate for easy access to any Portacath or PICC line.   We strive to give you quality time with your provider. You may need to reschedule your appointment if you arrive late (15 or more minutes).  Arriving late affects you and other patients whose appointments are after yours.  Also, if you miss three or more appointments without notifying the office, you may be dismissed from the clinic at the provider's discretion.      For prescription refill requests, have your pharmacy contact our office and allow 72 hours for refills to be completed.    Today you received the following chemotherapy and/or immunotherapy agents Udenyca      To help prevent nausea and vomiting after your treatment, we encourage you to take your nausea medication as directed.  BELOW ARE SYMPTOMS THAT SHOULD BE REPORTED IMMEDIATELY: *FEVER GREATER THAN 100.4 F (38 C) OR HIGHER *CHILLS OR SWEATING *NAUSEA AND VOMITING THAT IS NOT CONTROLLED WITH YOUR NAUSEA MEDICATION *UNUSUAL SHORTNESS OF BREATH *UNUSUAL BRUISING OR BLEEDING *URINARY PROBLEMS (pain or burning when urinating, or frequent urination) *BOWEL PROBLEMS (unusual diarrhea, constipation, pain near the anus) TENDERNESS IN MOUTH AND THROAT WITH OR WITHOUT PRESENCE OF ULCERS (sore throat, sores in mouth, or a toothache) UNUSUAL RASH, SWELLING OR PAIN  UNUSUAL VAGINAL DISCHARGE OR ITCHING   Items with * indicate a potential emergency and should be followed up as soon as possible or go to the Emergency Department if any problems should occur.  Please show the CHEMOTHERAPY ALERT CARD or IMMUNOTHERAPY ALERT CARD at check-in to the  Emergency Department and triage nurse.  Should you have questions after your visit or need to cancel or reschedule your appointment, please contact [facility]  Dept: [phone]  and follow the prompts.  Office hours are 8:00 a.m. to 4:30 p.m. Monday - Friday. Please note that voicemails left after 4:00 p.m. may not be returned until the following business day.  We are closed weekends and major holidays. You have access to a nurse at all times for urgent questions. Please call the main number to the clinic Dept: [phone] and follow the prompts.   For any non-urgent questions, you may also contact your provider using MyChart. We now offer e-Visits for anyone 38 and older to request care online for non-urgent symptoms. For details visit [email or web address].   Also download the MyChart app! Go to the app store, search "MyChart", open the app, select [facility], and log in with your MyChart username and password.  Due to Covid, a mask is required upon entering the hospital/clinic. If you do not have a mask, one will be given to you upon arrival. For doctor visits, patients may have 1 support person aged 63 or older with them. For treatment visits, patients cannot have anyone with them due to current Covid guidelines and our immunocompromised population.   Pegfilgrastim injection What is this medication? PEGFILGRASTIM (PEG fil gra stim) is a long-acting granulocyte colony-stimulating factor that stimulates the growth of neutrophils, a type of white blood cell important in the body's fight against infection. It is used to reduce the incidence of fever and infection in patients  with certain types of cancer who are receiving chemotherapy that affects the bone marrow, and to increase survival after being exposed to high doses of radiation. This medicine may be used for other purposes; ask your health care provider or pharmacist if you have questions. COMMON BRAND NAME(S):  Fulphila, Neulasta, Nyvepria, UDENYCA, Ziextenzo What should I tell my care team before I take this medication? They need to know if you have any of these conditions: kidney disease latex allergy ongoing radiation therapy sickle cell disease skin reactions to acrylic adhesives (On-Body Injector only) an unusual or allergic reaction to pegfilgrastim, filgrastim, other medicines, foods, dyes, or preservatives pregnant or trying to get pregnant breast-feeding How should I use this medication? This medicine is for injection under the skin. If you get this medicine at home, you will be taught how to prepare and give the pre-filled syringe or how to use the On-body Injector. Refer to the patient Instructions for Use for detailed instructions. Use exactly as directed. Tell your healthcare provider immediately if you suspect that the On-body Injector may not have performed as intended or if you suspect the use of the On-body Injector resulted in a missed or partial dose. It is important that you put your used needles and syringes in a special sharps container. Do not put them in a trash can. If you do not have a sharps container, call your pharmacist or healthcare provider to get one. Talk to your pediatrician regarding the use of this medicine in children. While this drug may be prescribed for selected conditions, precautions do apply. Overdosage: If you think you have taken too much of this medicine contact a poison control center or emergency room at once. NOTE: This medicine is only for you. Do not share this medicine with others. What if I miss a dose? It is important not to miss your dose. Call your doctor or health care professional if you miss your dose. If you miss a dose due to an On-body Injector failure or leakage, a new dose should be administered as soon as possible using a single prefilled syringe for manual use. What may interact with this medication? Interactions have not been  studied. This list may not describe all possible interactions. Give your health care provider a list of all the medicines, herbs, non-prescription drugs, or dietary supplements you use. Also tell them if you smoke, drink alcohol, or use illegal drugs. Some items may interact with your medicine. What should I watch for while using this medication? Your condition will be monitored carefully while you are receiving this medicine. You may need blood work done while you are taking this medicine. Talk to your health care provider about your risk of cancer. You may be more at risk for certain types of cancer if you take this medicine. If you are going to need a MRI, CT scan, or other procedure, tell your doctor that you are using this medicine (On-Body Injector only). What side effects may I notice from receiving this medication? Side effects that you should report to your doctor or health care professional as soon as possible: allergic reactions (skin rash, itching or hives, swelling of the face, lips, or tongue) back pain dizziness fever pain, redness, or irritation at site where injected pinpoint red spots on the skin red or dark-brown urine shortness of breath or breathing problems stomach or side pain, or pain at the shoulder swelling tiredness trouble passing urine or change in the amount of urine unusual bruising or bleeding   Side effects that usually do not require medical attention (report to your doctor or health care professional if they continue or are bothersome): bone pain muscle pain This list may not describe all possible side effects. Call your doctor for medical advice about side effects. You may report side effects to FDA at 1-800-FDA-1088. Where should I keep my medication? Keep out of the reach of children. If you are using this medicine at home, you will be instructed on how to store it. Throw away any unused medicine after the expiration date on the label. NOTE: This sheet  is a summary. It may not cover all possible information. If you have questions about this medicine, talk to your doctor, pharmacist, or health care provider.  2022 Elsevier/Gold Standard (2020-12-28 11:54:14)

## 2021-10-21 ENCOUNTER — Telehealth: Payer: Self-pay | Admitting: Family Medicine

## 2021-10-21 ENCOUNTER — Telehealth: Payer: Self-pay | Admitting: Orthopaedic Surgery

## 2021-10-21 ENCOUNTER — Other Ambulatory Visit: Payer: Self-pay | Admitting: Oncology

## 2021-10-21 DIAGNOSIS — L84 Corns and callosities: Secondary | ICD-10-CM | POA: Insufficient documentation

## 2021-10-21 DIAGNOSIS — C250 Malignant neoplasm of head of pancreas: Secondary | ICD-10-CM

## 2021-10-21 NOTE — Telephone Encounter (Signed)
Pt's wife calling. Stated they were here last week and everything looked good but now there is a blood blister forming where he was checked at previous appt. Pt wasn't sure if he needed to make another appt or if there was something at home he could do to help sine his next appt isn't until Jan 23. The best call back number is [phone].

## 2021-10-21 NOTE — Telephone Encounter (Signed)
Please advise. Would you like for me to make patient another appointment?

## 2021-10-21 NOTE — Telephone Encounter (Signed)
Left message for patient to call back and schedule Medicare Annual Wellness Visit (AWV) either virtually or in office. Left  my [name] number [phone]   Last AWV 10/10/21 please schedule at anytime with [facility] Nurse Health Advisor 1 or 2   This should be a 45 minute visit.

## 2021-10-22 ENCOUNTER — Other Ambulatory Visit: Payer: Self-pay

## 2021-10-22 ENCOUNTER — Encounter: Payer: Self-pay | Admitting: Orthopaedic Surgery

## 2021-10-22 ENCOUNTER — Ambulatory Visit (INDEPENDENT_AMBULATORY_CARE_PROVIDER_SITE_OTHER): Payer: Medicare Other | Admitting: Orthopaedic Surgery

## 2021-10-22 ENCOUNTER — Encounter: Payer: Self-pay | Admitting: Oncology

## 2021-10-22 VITALS — BP 146/81 | HR 89 | Temp 98.0°F | Ht 71.0 in | Wt 245.0 lb

## 2021-10-22 DIAGNOSIS — L84 Corns and callosities: Secondary | ICD-10-CM

## 2021-10-22 DIAGNOSIS — I25810 Atherosclerosis of coronary artery bypass graft(s) without angina pectoris: Secondary | ICD-10-CM

## 2021-10-22 DIAGNOSIS — C259 Malignant neoplasm of pancreas, unspecified: Secondary | ICD-10-CM | POA: Diagnosis not present

## 2021-10-22 MED ORDER — DOXYCYCLINE HYCLATE 100 MG PO TABS: 100.0000 mg | ORAL_TABLET | Freq: Two times a day (BID) | ORAL | 0 refills | Status: DC

## 2021-10-22 NOTE — Progress Notes (Signed)
Office Visit Note   Patient: [name]           Date of Birth: 1945/12/28           MRN: [id number] Visit Date: 10/22/2021              Requested by: [name], [name] G, MD [address] [place],  [place] [address] PCP: [name], [name] G, MD   Assessment & Plan: Visit Diagnoses:  1. Foot callus           Now with blister and cellulitis  Plan: Doxycycline 100 mg p.o. twice daily sent in.  If he said persistent problems he will let us know.  Follow-Up Instructions: No follow-ups on file.   Orders:  No orders of the defined types were placed in this encounter.  Meds ordered this encounter  Medications   doxycycline (VIBRA-TABS) 100 MG tablet    Sig: Take 1 tablet (100 mg total) by mouth 2 (two) times daily.    Dispense:  20 tablet    Refill:  0      Procedures: No procedures performed   Clinical Data: No additional findings.   Subjective: Chief Complaint  Patient presents with   Right Foot - Wound Check    Wound Check  75 year old male with Pancreatic adenocarcinoma previous fold ulcer returns early after calling with blood blister laterally over his foot.  Try to get out of bed but is losing his balance with sliding with his right foot supinated and slid lateral aspect fifth metatarsal along carpet.  He is on Eliquis had some bleeding with a blood blister and then noticed some increased cellulitis in his foot.  Foot is red trending up past the ankle.  Previously on doxycycline which did well.  He likes to walk barefooted in the house despite recommendations from his wife.  He uses Tylenol at night which helps with the discomfort in his feet from neuropathy.  Review of Systems all other systems unchanged.   Objective: Vital Signs: BP (!) 146/81   Pulse 89   Temp 98 F (36.7 C)   Ht 5\' 11"  (1.803 m)   Wt 245 lb (111.1 kg)   BMI 34.17 kg/m   Physical Exam Constitutional:      Appearance: He is well-developed.  HENT:     Head: Normocephalic  and atraumatic.     Right Ear: External ear normal.     Left Ear: External ear normal.  Eyes:     Pupils: Pupils are equal, round, and reactive to light.  Neck:     Thyroid: No thyromegaly.     Trachea: No tracheal deviation.  Cardiovascular:     Rate and Rhythm: Normal rate.  Pulmonary:     Effort: Pulmonary effort is normal.     Breath sounds: No wheezing.  Abdominal:     General: Bowel sounds are normal.     Palpations: Abdomen is soft.  Musculoskeletal:     Cervical back: Neck supple.  Skin:    General: Skin is warm and dry.     Capillary Refill: Capillary refill takes less than 2 seconds.  Neurological:     Mental Status: He is alert and oriented to person, place, and time.  Psychiatric:        Behavior: Behavior normal.        Thought Content: Thought content normal.        Judgment: Judgment normal.    Ortho Exam Band-Aid removed there is a  blood blister without callus formation.  Some cellulitis laterally that extends to the midfoot small streak runs up past the ankle.  Specialty Comments:  No specialty comments available.  Imaging: No results found.   PMFS History: Patient Active Problem List   Diagnosis Date Noted   Foot callus 10/21/2021   Cellulitis 09/22/2021   Gastritis, 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   Malnutrition of moderate degree 07/02/2021   Pancreatic cancer ([facility]) 07/01/2021   Pancreatic adenocarcinoma ([facility]) 07/01/2021   Coronary artery disease of bypass graft of native heart with stable angina pectoris ([facility]) 06/04/2021   Genetic testing 04/04/2021   History of prostate cancer    Family history of breast cancer    Port-A-Cath in place 03/14/2021   Atrial fibrillation ([facility])    Syncope and collapse 03/04/2021   Primary cancer of body of pancreas ([facility]) 01/31/2021   Chest pain of uncertain etiology 40/34/7425   Elevated lipase 10/17/2020   H/O agent Orange exposure 10/17/2020   Dyslipidemia, goal LDL below 70 10/17/2020   Hx of CABG 02/28/2020   DM  (diabetes mellitus), type 2 with renal complications ([facility]) 95/63/8756   Hypertension with heart disease 04/01/2019   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[facility]) 04/01/2019   CKD (chronic kidney disease), stage III ([facility]) 04/01/2019   Peripheral neuropathy 04/01/2019   Insomnia 11/30/2018   Past Medical History:  Diagnosis Date   A-fib ([facility]) 03/04/2021   Anemia    Arthritis    Cancer (H[facility])    prostate   Chronic kidney disease    blood in urine    Constipation    Coronary artery disease    Depression    Diabetes mellitus without complication ([facility])    Difficult intubation    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 it was hard to get the tube down his throat   Dyspnea    Family history of breast cancer    Fatty liver    Fatty liver    Frequent headaches    GERD (gastroesophageal reflux disease)    History of chicken pox    History of fainting spells of unknown cause    History of prostate cancer    Hyperlipidemia    Hypertension    Myocardial infarction ([facility])    Peripheral neuropathy    Pneumonia    Prostate cancer (HCC)    PTSD (post-traumatic stress disorder)    Sleep apnea    uses Cpap    Family History  Problem Relation Age of Onset   Arthritis Mother    Heart disease Mother    Hyperlipidemia Mother    Hypertension Mother    Heart attack Mother    Breast cancer Sister        dx 45s   Heart attack Brother    Heart disease Brother    Depression Daughter    Arthritis Sister    Cancer Sister        unknown type, dx 53s   Cancer Brother    Learning disabilities Brother    Alcohol abuse Brother    COPD Brother    Heart attack Brother    Heart disease Brother     Past Surgical History:  Procedure Laterality Date   APPENDECTOMY     BIOPSY  01/15/2021   Procedure: BIOPSY;  Surgeon: Mansouraty, Telford Nab., MD;  Location: [facility] ENDOSCOPY;  Service: Gastroenterology;;   CARDIAC CATHETERIZATION  06/26/2017   CARDIAC SURGERY     Triple Bypass   CHOLECYSTECTOMY  2010   COLONOSCOPY  CORONARY ARTERY BYPASS GRAFT  2004   ESOPHAGOGASTRODUODENOSCOPY (EGD) WITH PROPOFOL N/A 01/15/2021   Procedure: ESOPHAGOGASTRODUODENOSCOPY (EGD) WITH PROPOFOL;  Surgeon: Rush Landmark Telford Nab., MD;  Location: [facility];  Service: Gastroenterology;  Laterality: N/A;   EUS N/A 01/15/2021   Procedure: UPPER ENDOSCOPIC ULTRASOUND (EUS) RADIAL;  Surgeon: Irving Copas., MD;  Location: [facility];  Service: Gastroenterology;  Laterality: N/A;   EYE SURGERY Bilateral    cataract removal   FINE NEEDLE ASPIRATION  01/15/2021   Procedure: FINE NEEDLE ASPIRATION (FNA) LINEAR;  Surgeon: Irving Copas., MD;  Location: [facility];  Service: Gastroenterology;;   LAPAROSCOPY N/A 07/01/2021   Procedure: STAGING LAPAROSCOPY;  Surgeon: Dwan Bolt, MD;  Location: [facility];  Service: General;  Laterality: N/A;   PORTACATH PLACEMENT Right 02/21/2021   Procedure: INSERTION PORT-A-CATH;  Surgeon: Dwan Bolt, MD;  Location: [facility];  Service: General;  Laterality: Right;   SPLENECTOMY, 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

## 2021-10-22 NOTE — Telephone Encounter (Signed)
Work in appt made for this afternoon.

## 2021-10-23 ENCOUNTER — Ambulatory Visit (HOSPITAL_BASED_OUTPATIENT_CLINIC_OR_DEPARTMENT_OTHER): Payer: Medicare Other | Attending: Family Medicine | Admitting: Physical Therapy

## 2021-10-23 DIAGNOSIS — M6281 Muscle weakness (generalized): Secondary | ICD-10-CM | POA: Insufficient documentation

## 2021-10-23 DIAGNOSIS — R2689 Other abnormalities of gait and mobility: Secondary | ICD-10-CM | POA: Insufficient documentation

## 2021-10-23 DIAGNOSIS — R296 Repeated falls: Secondary | ICD-10-CM | POA: Insufficient documentation

## 2021-10-23 NOTE — Therapy (Signed)
OUTPATIENT PHYSICAL THERAPY LOWER EXTREMITY EVALUATION   Patient Name: [name] MRN: [id number] DOB:1946-10-14, 75 y.o., male Today's Date: 10/25/2021   PT End of Session - 10/25/21 0810     Visit Number 1    Number of Visits 16    Date for PT Re-Evaluation 12/20/21    PT Start Time 7628    PT Stop Time 1228    PT Time Calculation (min) 43 min    Activity Tolerance Patient tolerated treatment well    Behavior During Therapy [facility] for tasks assessed/performed             Past Medical History:  Diagnosis Date   A-fib ([place]) 03/04/2021   Anemia    Arthritis    Cancer ([place])    prostate   Chronic kidney disease    blood in urine    Constipation    Coronary artery disease    Depression    Diabetes mellitus without complication ([place])    Difficult intubation    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 it was hard to get the tube down his throat   Dyspnea    Family history of breast cancer    Fatty liver    Fatty liver    Frequent headaches    GERD (gastroesophageal reflux disease)    History of chicken pox    History of fainting spells of unknown cause    History of prostate cancer    Hyperlipidemia    Hypertension    Myocardial infarction ([place])    Peripheral neuropathy    Pneumonia    Prostate cancer ([place])    PTSD (post-traumatic stress disorder)    Sleep apnea    uses Cpap   Past Surgical History:  Procedure Laterality Date   APPENDECTOMY     BIOPSY  01/15/2021   Procedure: BIOPSY;  Surgeon: Irving Copas., MD;  Location: [place];  Service: Gastroenterology;;   CARDIAC CATHETERIZATION  06/26/2017   CARDIAC SURGERY     Triple Bypass   CHOLECYSTECTOMY  2010   COLONOSCOPY     CORONARY ARTERY BYPASS GRAFT  2004   ESOPHAGOGASTRODUODENOSCOPY (EGD) WITH PROPOFOL N/A 01/15/2021   Procedure: ESOPHAGOGASTRODUODENOSCOPY (EGD) WITH PROPOFOL;  Surgeon: Irving Copas., MD;  Location: [facility] ENDOSCOPY;  Service: Gastroenterology;  Laterality: N/A;   EUS  N/A 01/15/2021   Procedure: UPPER ENDOSCOPIC ULTRASOUND (EUS) RADIAL;  Surgeon: Irving Copas., MD;  Location: [place];  Service: Gastroenterology;  Laterality: N/A;   EYE SURGERY Bilateral    cataract removal   FINE NEEDLE ASPIRATION  01/15/2021   Procedure: FINE NEEDLE ASPIRATION (FNA) LINEAR;  Surgeon: Irving Copas., MD;  Location[facility] ENDOSCOPY;  Service: Gastroenterology;;   LAPAROSCOPY N/A 07/01/2021   Procedure: STAGING LAPAROSCOPY;  Surgeon: Dwan Bolt, MD;  Location: [place];  Service: General;  Laterality: N/A;   PORTACATH PLACEMENT Right 02/21/2021   Procedure: INSERTION PORT-A-CATH;  Surgeon: Dwan Bolt, MD;  Location: [place];  Service: General;  Laterality: Right;   SPLENECTOMY, 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   Procedure: SPLENECTOMY;  Surgeon: Dwan Bolt, MD;  Location: [place];  Service: General;  Laterality: N/A;   TONSILLECTOMY  1958   Patient Active Problem List   Diagnosis Date Noted   Foot callus 10/21/2021   Cellulitis 09/22/2021   Gastritis, 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   Malnutrition of moderate degree 07/02/2021   Pancreatic cancer ([place]) 07/01/2021   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   Coronary artery disease of bypass graft of native heart  with stable angina pectoris ([place]) 06/04/2021   Genetic testing 04/04/2021   History of prostate cancer    Family history of breast cancer    Port-A-Cath in place 03/14/2021   Atrial fibrillation ([place])    Syncope and collapse 03/04/2021   Primary cancer of body of pancreas ([place]) 01/31/2021   Chest pain of uncertain etiology 21/30/8657   Elevated lipase 10/17/2020   H/O agent Orange exposure 10/17/2020   Dyslipidemia, goal LDL below 70 10/17/2020   Hx of CABG 02/28/2020   DM (diabetes mellitus), type 2 with renal complications ([place]) 84/69/6295   Hypertension with heart disease 04/01/2019   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[place]) 04/01/2019   CKD (chronic kidney disease), stage III ([place]) 04/01/2019    Peripheral neuropathy 04/01/2019   Insomnia 11/30/2018    PCP: [name], [name] G, MD  REFERRING PROVIDER: [name], [name] G, MD  REFERRING DIAG: Frequent falls   THERAPY DIAG:  Frequent falls   ONSET DATE: Cancer diagnosis February  SUBJECTIVE:   SUBJECTIVE STATEMENT: Patient was diagnosed with pancreatic cancer in Febraury of 2022. He has undergone chemo therapy since that point. He feels like he is loosing muscle in his legs. He has had some falls. He also has a wound around his 5ht met head on the left. He has been using a walker on and off for a few years. As of recently he has been using the walker all of the time.   PERTINENT HISTORY: Pancreatic cancer; arthritis and debility in both shoulders; dyspnea with exertion; heart attack; blood pressure fluctuation; spleen removal     PAIN:  Are you having pain? Yes VAS scale: 2/10 Pain location: right foot  Pain orientation: Right  PAIN TYPE: aching Pain description: intermittent  Aggravating factors: standing and walking  Relieving factors: rest   PRECAUTIONS: Fall  WEIGHT BEARING RESTRICTIONS No  FALLS:  Has patient fallen in last 6 months? Yes, Number of falls: 3 major falls several small losses of balance The last time he fell he slid off the edge of the bed and couldn't hold himself; A few months ago he fell in the bathroom   LIVING ENVIRONMENT: Grab bars in the house; has steps in th house, but the patient dosen;'t have to go up them   OCCUPATION: retried  Works on computers  PLOF: Independent with household mobility with device  PATIENT GOALS   To improve strength and balance    OBJECTIVE:   DIAGNOSTIC FINDINGS: nothing recent   PATIENT SURVEYS:    COGNITION:  Overall cognitive status: Within functional limits for tasks assessed     SENSATION:  Light touch: Appears intact  Stereognosis: Appears intact  Hot/Cold: Appears intact  Proprioception: Appears intact Bilateral hand numbness and  bilateral foot numbness   MUSCLE LENGTH:  POSTURE:    LE AROM/PROM:  A/PROM Right 10/25/2021 Left 10/25/2021  Hip flexion    Hip extension    Hip abduction    Hip adduction    Hip internal rotation    Hip external rotation    Knee flexion    Knee extension    Ankle dorsiflexion    Ankle plantarflexion    Ankle inversion    Ankle eversion    Unable to lie on his back for PROM of his hips; limited active hip flexion in standing    Grip :  right 20 lbs left 30 lbs  LE MMT:  MMT Right 10/25/2021 Left 10/25/2021  Hip flexion 16.5 15.6  Hip extension  Hip abduction 17.8 18.8  Hip adduction    Hip internal rotation    Hip external rotation    Knee flexion    Knee extension 29.4 23.9  Ankle dorsiflexion    Ankle plantarflexion    Ankle inversion    Ankle eversion     (Blank rows = not tested)    FUNCTIONAL TESTS:  Standing test: 40 seconds with CGA-> Min A before becoming off balance and needing to sit  Narrow base of support: min a  Narrow base with eyes closed mod a with 1 max a catch of his balance.   GAIT: Walks with significant weight on his rollator. Flexed trunk and decreased hip flexion    TODAY'S TREATMENT:   Exercises Seated Knee Extension with Resistance - 1 x daily - 7 x weekly - 3 sets - 10 reps Seated Hip Abduction with Resistance - 1 x daily - 7 x weekly - 3 sets - 10 reps Seated March - 1 x daily - 7 x weekly - 3 sets - 10 reps   PATIENT EDUCATION:  Education details: reviewed benefits of balance training;  Person educated: Patient Education method: Explanation, Demonstration, Tactile cues, and Verbal cues Education comprehension: verbalized understanding, returned demonstration, verbal cues required, tactile cues required, and needs further education   HOME EXERCISE PROGRAM: Access Code: [email or web address] URL: [email or web address] Date: 10/25/2021 Prepared by: Carolyne Littles  Exercises Seated Knee Extension with  Resistance - 1 x daily - 7 x weekly - 3 sets - 10 reps Seated Hip Abduction with Resistance - 1 x daily - 7 x weekly - 3 sets - 10 reps Seated March - 1 x daily - 7 x weekly - 3 sets - 10 reps   ASSESSMENT:  CLINICAL IMPRESSION: Patient is a 75 year old male who presents to therapy with decreased balance and endurance following chemo therapy 2nd to pancreatic cancer.  He presents with general weakness and significant limitations in balance. We will perform a BERG balance test next visit when time allows. He would benefit from skilled therapy to improve ability to ambulate and improve safety with daily tasks.  Objective impairments include Abnormal gait, decreased activity tolerance, decreased balance, decreased endurance, decreased mobility, difficulty walking, decreased ROM, decreased strength, impaired UE functional use, and pain. These impairments are limiting patient from cleaning, driving, meal prep, yard work, and shopping. Personal factors including Age, Fitness, and 1-2 comorbidities: significant arthritis in his shoulders and peripheral neuropathy  are also affecting patient's functional outcome. Patient will benefit from skilled PT to address above impairments and improve overall function.  REHAB POTENTIAL: Good  CLINICAL DECISION MAKING: Evolving/moderate complexity more falls and declining mobility   EVALUATION COMPLEXITY: Moderate   GOALS: Goals reviewed with patient? Yes  SHORT TERM GOALS:  STG Name Target Date Goal status  1 Patient will increase gross LE strength by 5 lbs  Baseline:  11/22/2021 INITIAL  2 Patient will require min a with tandem stance with eyes closed Baseline:  11/22/2021 INITIAL  3 Patient will ambulate 300' with improved posture and hip flexion  Baseline: 11/22/2021 INITIAL  LONG TERM GOALS:   LTG Name Target Date Goal status  1 BERG balance goal to be established  Baseline: 12/20/2021 INITIAL  2 Patient wil have no falls for a 2 week time period in  order to improve safety  Baseline: 12/20/2021 INITIAL  3 Patient will report improved ability to perfrom ADLs without assistance  Baseline: 12/20/2021 INITIAL  PLAN: PT FREQUENCY: 2x/week  PT  DURATION: 8 weeks  PLANNED INTERVENTIONS: Therapeutic exercises, Therapeutic activity, Neuro Muscular re-education, Balance training, Gait training, Patient/Family education, Joint mobilization, Stair training, DME instructions, Aquatic Therapy, Electrical stimulation, Cryotherapy, Moist heat, Taping, and Manual therapy  PLAN FOR NEXT SESSION: Patient may need lift to get into the pool. In the pool work on gait, balance, and hip strengthening. On land develop his HEP for home including seated UE and LE exercises    Carney Living PT DPT  10/25/2021, 8:13 AM

## 2021-10-25 ENCOUNTER — Encounter (HOSPITAL_BASED_OUTPATIENT_CLINIC_OR_DEPARTMENT_OTHER): Payer: Self-pay | Admitting: Physical Therapy

## 2021-10-27 ENCOUNTER — Other Ambulatory Visit: Payer: Self-pay | Admitting: Oncology

## 2021-10-29 ENCOUNTER — Encounter: Payer: Self-pay | Admitting: Oncology

## 2021-10-29 ENCOUNTER — Inpatient Hospital Stay (HOSPITAL_BASED_OUTPATIENT_CLINIC_OR_DEPARTMENT_OTHER): Payer: Medicare Other | Admitting: Oncology

## 2021-10-29 ENCOUNTER — Inpatient Hospital Stay: Payer: Medicare Other

## 2021-10-29 ENCOUNTER — Other Ambulatory Visit: Payer: Self-pay

## 2021-10-29 VITALS — Wt 250.0 lb

## 2021-10-29 VITALS — BP 139/71 | HR 97 | Temp 98.7°F | Resp 18 | Ht 71.0 in | Wt 254.0 lb

## 2021-10-29 DIAGNOSIS — C251 Malignant neoplasm of body of pancreas: Secondary | ICD-10-CM | POA: Diagnosis not present

## 2021-10-29 DIAGNOSIS — Z5189 Encounter for other specified aftercare: Secondary | ICD-10-CM | POA: Diagnosis not present

## 2021-10-29 DIAGNOSIS — Z5111 Encounter for antineoplastic chemotherapy: Secondary | ICD-10-CM | POA: Diagnosis not present

## 2021-10-29 DIAGNOSIS — K2960 Other gastritis without bleeding: Secondary | ICD-10-CM

## 2021-10-29 LAB — CBC WITH DIFFERENTIAL (CANCER CENTER ONLY)
Abs Immature Granulocytes: 0.58 10*3/uL — ABNORMAL HIGH (ref 0.00–0.07)
Basophils Absolute: 0 10*3/uL (ref 0.0–0.1)
Basophils Relative: 0 %
Eosinophils Absolute: 0.3 10*3/uL (ref 0.0–0.5)
Eosinophils Relative: 2 %
HCT: 28.9 % — ABNORMAL LOW (ref 39.0–52.0)
Hemoglobin: 9.1 g/dL — ABNORMAL LOW (ref 13.0–17.0)
Immature Granulocytes: 4 %
Lymphocytes Relative: 15 %
Lymphs Abs: 2.1 10*3/uL (ref 0.7–4.0)
MCH: 28.2 pg (ref 26.0–34.0)
MCHC: 31.5 g/dL (ref 30.0–36.0)
MCV: 89.5 fL (ref 80.0–100.0)
Monocytes Absolute: 1.6 10*3/uL — ABNORMAL HIGH (ref 0.1–1.0)
Monocytes Relative: 11 %
Neutro Abs: 9.6 10*3/uL — ABNORMAL HIGH (ref 1.7–7.7)
Neutrophils Relative %: 68 %
Platelet Count: 235 10*3/uL (ref 150–400)
RBC: 3.23 MIL/uL — ABNORMAL LOW (ref 4.22–5.81)
RDW: 27.7 % — ABNORMAL HIGH (ref 11.5–15.5)
WBC Count: 14.1 10*3/uL — ABNORMAL HIGH (ref 4.0–10.5)
nRBC: 1.9 % — ABNORMAL HIGH (ref 0.0–0.2)

## 2021-10-29 LAB — CMP (CANCER CENTER ONLY)
ALT: 13 U/L (ref 0–44)
AST: 22 U/L (ref 15–41)
Albumin: 3.2 g/dL — ABNORMAL LOW (ref 3.5–5.0)
Alkaline Phosphatase: 125 U/L (ref 38–126)
Anion gap: 8 (ref 5–15)
BUN: 17 mg/dL (ref 8–23)
CO2: 22 mmol/L (ref 22–32)
Calcium: 9 mg/dL (ref 8.9–10.3)
Chloride: 107 mmol/L (ref 98–111)
Creatinine: 1.14 mg/dL (ref 0.61–1.24)
GFR, Estimated: >60 mL/min (ref >60)
Glucose, Bld: 159 mg/dL — ABNORMAL HIGH (ref 70–99)
Potassium: 4.3 mmol/L (ref 3.5–5.1)
Sodium: 137 mmol/L (ref 135–145)
Total Bilirubin: 0.5 mg/dL (ref 0.3–1.2)
Total Protein: 6.2 g/dL — ABNORMAL LOW (ref 6.5–8.1)

## 2021-10-29 MED ORDER — ONDANSETRON HCL 8 MG PO TABS
8.0000 mg | ORAL_TABLET | Freq: Once | ORAL | Status: AC
  Administered 2021-10-29: 8 mg via ORAL
  Filled 2021-10-29: qty 1

## 2021-10-29 MED ORDER — HEPARIN SOD (PORK) LOCK FLUSH 100 UNIT/ML IV SOLN
500.0000 [IU] | Freq: Once | INTRAVENOUS | Status: AC | PRN
  Administered 2021-10-29: 500 [IU]

## 2021-10-29 MED ORDER — SODIUM CHLORIDE 0.9 % IV SOLN
1000.0000 mg/m2 | Freq: Once | INTRAVENOUS | Status: AC
  Administered 2021-10-29: 2394 mg via INTRAVENOUS
  Filled 2021-10-29: qty 52.6

## 2021-10-29 MED ORDER — SODIUM CHLORIDE 0.9% FLUSH
10.0000 mL | INTRAVENOUS | Status: DC | PRN
  Administered 2021-10-29: 10 mL

## 2021-10-29 MED ORDER — SODIUM CHLORIDE 0.9 % IV SOLN: Freq: Once | INTRAVENOUS | Status: AC

## 2021-10-29 MED ORDER — DEXAMETHASONE SODIUM PHOSPHATE 10 MG/ML IJ SOLN
5.0000 mg | Freq: Once | INTRAMUSCULAR | Status: AC
  Administered 2021-10-29: 5 mg via INTRAVENOUS
  Filled 2021-10-29: qty 1

## 2021-10-29 NOTE — Progress Notes (Signed)
Patient presents for treatment. RN assessment completed along with the following:  Labs/vitals reviewed - Yes, and within treatment parameters.   Weight within 10% of previous measurement - Yes Oncology Treatment Attestation completed for current therapy- Yes, on date 02/15/21 Informed consent completed and reflects current therapy/intent - Yes, on date 03/01/21             Provider progress note reviewed - Yes, today's provider note was reviewed. Treatment/Antibody/Supportive plan reviewed - Yes, and abraxane discontinued S&H and other orders reviewed - Yes, and there are no additional orders identified. Previous treatment date reviewed - Yes, and the appropriate amount of time has elapsed between treatments. Clinic Hand Off Received from  Cristy Friedlander, RN  Patient to proceed with treatment.

## 2021-10-29 NOTE — Progress Notes (Signed)
[facility] OFFICE PROGRESS NOTE   Diagnosis: Pancreas cancer  INTERVAL HISTORY:   [name] completed another cycle of gemcitabine/Abraxane on 10/16/2021.  He reports increased numbness in the fingers.  This is interfering with activity.  He is completing a course of doxycycline for the right foot ulcer.  The ulcer is healing.  No other complaint.  Objective:  Vital signs in last 24 hours:  Blood pressure 139/71, pulse 97, temperature 98.7 F (37.1 C), temperature source Oral, resp. rate 18, height 5\' 11"  (1.803 m), weight 254 lb (115.2 kg), SpO2 100 %.    HEENT: No thrush or ulcers Resp: Lungs clear bilaterally Cardio: Regular rate and rhythm GI: No hepatosplenomegaly Vascular: Chronic stasis change at the right lower leg  Skin: Erythema at the right fifth tarsal, ulceration near the right fifth MTP joint has almost completely healed  Portacath/PICC-without erythema  Lab Results:  Lab Results  Component Value Date   WBC 14.1 (H) 10/29/2021   HGB 9.1 (L) 10/29/2021   HCT 28.9 (L) 10/29/2021   MCV 89.5 10/29/2021   PLT 235 10/29/2021   NEUTROABS 9.6 (H) 10/29/2021    CMP  Lab Results  Component Value Date   NA 138 10/16/2021   K 4.2 10/16/2021   CL 106 10/16/2021   CO2 25 10/16/2021   GLUCOSE 180 (H) 10/16/2021   BUN 19 10/16/2021   CREATININE 1.02 10/16/2021   CALCIUM 8.5 (L) 10/16/2021   PROT 6.3 (L) 10/16/2021   ALBUMIN 3.1 (L) 10/16/2021   AST 24 10/16/2021   ALT 12 10/16/2021   ALKPHOS 101 10/16/2021   BILITOT 0.4 10/16/2021   GFRNONAA >60 10/16/2021   GFRAA 49 (L) 10/10/2020     Medications: I have reviewed the patient's current medications.   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  No pancreatic ductal dilatation identified.  No definite signs of metastatic disease in the chest, abdomen or pelvis. Cycle 1 gemcitabine/Abraxane 03/01/2021 Cycle 2 gemcitabine/Abraxane 03/14/2021 Cycle 3 gemcitabine/Abraxane 03/28/2021 Cycle 4 gemcitabine/Abraxane 04/17/2021 Cycle 5 gemcitabine/Abraxane 05/01/2021, Zofran/Decadron added Cycle 6 gemcitabine/Abraxane 05/15/2021 CT pancreas protocol 05/27/2021-unchanged 8 mm exophytic low-attenuation lesion at the posterior body of the pancreas 07/01/2021-distal pancreatectomy/splenectomy, 0.8 cm poorly differentiated adenocarcinoma, treatment response score-2, largest single foci of remaining tumor 0.2 cm, negative resection margins, 0/6 lymph nodes,ypT1bypNo, PanIN-1b Cycle 7 gemcitabine/Abraxane 08/14/2021 Cycle 8 gemcitabine/Abraxane 09/04/2021 Cycle 9 gemcitabine/Abraxane 09/18/2021 Cycle 10 gemcitabine/Abraxane 10/01/2021 Cycle 11 gemcitabine/Abraxane 10/16/2021 Cycle 12 gemcitabine/Abraxane 10/29/2021, Abraxane held secondary to neuropathy Diabetes Coronary artery disease Prostate cancer 2013-treated with radiation [facility] Hypertension Sleep apnea 7.  Coronary artery bypass surgery 2004 8.  Hospital admission 03/04/2021-syncope, A. Fib-started on Eliquis 9.  Mild thrombocytopenia following cycle 1 gemcitabine/Abraxane-resolved 10.  Anemia 11.  Admission 09/22/2021 with a right metatarsal ulcer and right leg cellulitis         Disposition: [name] will complete a final cycle of adjuvant chemotherapy today.  Abraxane will be held secondary to neuropathy symptoms.  He will return for  an  office visit in 1 month.  He will call for symptoms of anemia.  He will be referred to Dr. Zenia [name] for Port-A-Cath removal.  Bill Coder, MD  10/29/2021  9:45 AM

## 2021-10-29 NOTE — Patient Instructions (Signed)
[facility]  Discharge Instructions: Thank you for choosing [facility] to provide your oncology and hematology care.   If you have a lab appointment with the [facility], please go directly to the [facility] and check in at the registration area.   Wear comfortable clothing and clothing appropriate for easy access to any Portacath or PICC line.   We strive to give you quality time with your provider. You may need to reschedule your appointment if you arrive late (15 or more minutes).  Arriving late affects you and other patients whose appointments are after yours.  Also, if you miss three or more appointments without notifying the office, you may be dismissed from the clinic at the provider's discretion.      For prescription refill requests, have your pharmacy contact our office and allow 72 hours for refills to be completed.    Today you received the following chemotherapy and/or immunotherapy agents Gemzar      To help prevent nausea and vomiting after your treatment, we encourage you to take your nausea medication as directed.  BELOW ARE SYMPTOMS THAT SHOULD BE REPORTED IMMEDIATELY: *FEVER GREATER THAN 100.4 F (38 C) OR HIGHER *CHILLS OR SWEATING *NAUSEA AND VOMITING THAT IS NOT CONTROLLED WITH YOUR NAUSEA MEDICATION *UNUSUAL SHORTNESS OF BREATH *UNUSUAL BRUISING OR BLEEDING *URINARY PROBLEMS (pain or burning when urinating, or frequent urination) *BOWEL PROBLEMS (unusual diarrhea, constipation, pain near the anus) TENDERNESS IN MOUTH AND THROAT WITH OR WITHOUT PRESENCE OF ULCERS (sore throat, sores in mouth, or a toothache) UNUSUAL RASH, SWELLING OR PAIN  UNUSUAL VAGINAL DISCHARGE OR ITCHING   Items with * indicate a potential emergency and should be followed up as soon as possible or go to the Emergency Department if any problems should occur.  Please show the CHEMOTHERAPY ALERT CARD or IMMUNOTHERAPY ALERT CARD at check-in to the  Emergency Department and triage nurse.  Should you have questions after your visit or need to cancel or reschedule your appointment, please contact [facility]  Dept: [phone]  and follow the prompts.  Office hours are 8:00 a.m. to 4:30 p.m. Monday - Friday. Please note that voicemails left after 4:00 p.m. may not be returned until the following business day.  We are closed weekends and major holidays. You have access to a nurse at all times for urgent questions. Please call the main number to the clinic Dept: [phone] and follow the prompts.   For any non-urgent questions, you may also contact your provider using MyChart. We now offer e-Visits for anyone 53 and older to request care online for non-urgent symptoms. For details visit [email or web address].   Also download the MyChart app! Go to the app store, search "MyChart", open the app, select [facility], and log in with your MyChart username and password.  Due to Covid, a mask is required upon entering the hospital/clinic. If you do not have a mask, one will be given to you upon arrival. For doctor visits, patients may have 1 support person aged 29 or older with them. For treatment visits, patients cannot have anyone with them due to current Covid guidelines and our immunocompromised population.   Gemcitabine injection What is this medication? GEMCITABINE (jem SYE ta been) is a chemotherapy drug. This medicine is used to treat many types of cancer like breast cancer, lung cancer, pancreatic cancer, and ovarian cancer. This medicine may be used for other purposes; ask your health care provider or  pharmacist if you have questions. COMMON BRAND NAME(S): Gemzar, Infugem What should I tell my care team before I take this medication? They need to know if you have any of these conditions: blood disorders infection kidney disease liver disease lung or breathing disease, like asthma recent or ongoing radiation  therapy an unusual or allergic reaction to gemcitabine, other chemotherapy, other medicines, foods, dyes, or preservatives pregnant or trying to get pregnant breast-feeding How should I use this medication? This drug is given as an infusion into a vein. It is administered in a hospital or clinic by a specially trained health care professional. Talk to your pediatrician regarding the use of this medicine in children. Special care may be needed. Overdosage: If you think you have taken too much of this medicine contact a poison control center or emergency room at once. NOTE: This medicine is only for you. Do not share this medicine with others. What if I miss a dose? It is important not to miss your dose. Call your doctor or health care professional if you are unable to keep an appointment. What may interact with this medication? medicines to increase blood counts like filgrastim, pegfilgrastim, sargramostim some other chemotherapy drugs like cisplatin vaccines Talk to your doctor or health care professional before taking any of these medicines: acetaminophen aspirin ibuprofen ketoprofen naproxen This list may not describe all possible interactions. Give your health care provider a list of all the medicines, herbs, non-prescription drugs, or dietary supplements you use. Also tell them if you smoke, drink alcohol, or use illegal drugs. Some items may interact with your medicine. What should I watch for while using this medication? Visit your doctor for checks on your progress. This drug may make you feel generally unwell. This is not uncommon, as chemotherapy can affect healthy cells as well as cancer cells. Report any side effects. Continue your course of treatment even though you feel ill unless your doctor tells you to stop. In some cases, you may be given additional medicines to help with side effects. Follow all directions for their use. Call your doctor or health care professional for  advice if you get a fever, chills or sore throat, or other symptoms of a cold or flu. Do not treat yourself. This drug decreases your body's ability to fight infections. Try to avoid being around people who are sick. This medicine may increase your risk to bruise or bleed. Call your doctor or health care professional if you notice any unusual bleeding. Be careful brushing and flossing your teeth or using a toothpick because you may get an infection or bleed more easily. If you have any dental work done, tell your dentist you are receiving this medicine. Avoid taking products that contain aspirin, acetaminophen, ibuprofen, naproxen, or ketoprofen unless instructed by your doctor. These medicines may hide a fever. Do not become pregnant while taking this medicine or for 6 months after stopping it. Women should inform their doctor if they wish to become pregnant or think they might be pregnant. Men should not father a child while taking this medicine and for 3 months after stopping it. There is a potential for serious side effects to an unborn child. Talk to your health care professional or pharmacist for more information. Do not breast-feed an infant while taking this medicine or for at least 1 week after stopping it. Men should inform their doctors if they wish to father a child. This medicine may lower sperm counts. Talk with your doctor or health care  professional if you are concerned about your fertility. What side effects may I notice from receiving this medication? Side effects that you should report to your doctor or health care professional as soon as possible: allergic reactions like skin rash, itching or hives, swelling of the face, lips, or tongue breathing problems pain, redness, or irritation at site where injected signs and symptoms of a dangerous change in heartbeat or heart rhythm like chest pain; dizziness; fast or irregular heartbeat; palpitations; feeling faint or lightheaded, falls;  breathing problems signs of decreased platelets or bleeding - bruising, pinpoint red spots on the skin, black, tarry stools, blood in the urine signs of decreased red blood cells - unusually weak or tired, feeling faint or lightheaded, falls signs of infection - fever or chills, cough, sore throat, pain or difficulty passing urine signs and symptoms of kidney injury like trouble passing urine or change in the amount of urine signs and symptoms of liver injury like dark yellow or brown urine; general ill feeling or flu-like symptoms; light-colored stools; loss of appetite; nausea; right upper belly pain; unusually weak or tired; yellowing of the eyes or skin swelling of ankles, feet, hands Side effects that usually do not require medical attention (report to your doctor or health care professional if they continue or are bothersome): constipation diarrhea hair loss loss of appetite nausea rash vomiting This list may not describe all possible side effects. Call your doctor for medical advice about side effects. You may report side effects to FDA at 1-800-FDA-1088. Where should I keep my medication? This drug is given in a hospital or clinic and will not be stored at home. NOTE: This sheet is a summary. It may not cover all possible information. If you have questions about this medicine, talk to your doctor, pharmacist, or health care provider.  2022 Elsevier/Gold Standard (2018-02-24 00:00:00)

## 2021-10-29 NOTE — Progress Notes (Signed)
Patient seen by Dr. Benay Spice today  Vitals are within treatment parameters.  Labs reviewed by Dr. Benay Spice and are within treatment parameters.  Per physician team, patient is ready for treatment. Please note that modifications are being made to the treatment plan including : Discontinue Abraxane and Udenyca.

## 2021-10-30 ENCOUNTER — Inpatient Hospital Stay: Payer: Medicare Other

## 2021-10-31 ENCOUNTER — Telehealth: Payer: Self-pay

## 2021-10-31 NOTE — Telephone Encounter (Signed)
-----   Message from Ladell Pier, MD sent at 10/30/2021  6:47 PM EST ----- Please let him know Dr. Zenia Resides recommends leaving the Port-A-Cath in a while longer, please be sure he is scheduled for a Port-A-Cath flush at least every 6 weeks, thanks ----- Message ----- From: Dwan Bolt, MD Sent: 10/30/2021   8:08 AM EST To: Ladell Pier, MD  I just saw him 1-2 weeks ago. Is the port bothering him? I would prefer to leave it in for a little while after he completes treatment in case he recurs or needs it again, but if it's causing him pain or discomfort or isn't working well I'm happy to go ahead and remove it. Thanks, [name]  ----- Message ----- From: Ladell Pier, MD Sent: 10/29/2021  10:13 AM EST To: Dwan Bolt, MD  Completing final cycle of adjuvant chemotherapy today.  Referred him to you for Port-A-Cath removal  Thanks,  [name]

## 2021-10-31 NOTE — Telephone Encounter (Signed)
Left message for Pt to inform him Dr Zenia Resides would prefer he keep the port in for a little while longer.

## 2021-11-04 ENCOUNTER — Encounter (HOSPITAL_BASED_OUTPATIENT_CLINIC_OR_DEPARTMENT_OTHER): Payer: Self-pay

## 2021-11-04 ENCOUNTER — Ambulatory Visit (HOSPITAL_BASED_OUTPATIENT_CLINIC_OR_DEPARTMENT_OTHER): Payer: Medicare Other | Admitting: Physical Therapy

## 2021-11-06 ENCOUNTER — Encounter (HOSPITAL_BASED_OUTPATIENT_CLINIC_OR_DEPARTMENT_OTHER): Payer: Self-pay | Admitting: Physical Therapy

## 2021-11-06 ENCOUNTER — Ambulatory Visit (HOSPITAL_BASED_OUTPATIENT_CLINIC_OR_DEPARTMENT_OTHER): Payer: Medicare Other | Admitting: Physical Therapy

## 2021-11-06 ENCOUNTER — Other Ambulatory Visit: Payer: Self-pay

## 2021-11-06 DIAGNOSIS — R296 Repeated falls: Secondary | ICD-10-CM

## 2021-11-06 DIAGNOSIS — M6281 Muscle weakness (generalized): Secondary | ICD-10-CM

## 2021-11-06 DIAGNOSIS — R2689 Other abnormalities of gait and mobility: Secondary | ICD-10-CM | POA: Diagnosis not present

## 2021-11-06 NOTE — Therapy (Signed)
OUTPATIENT PHYSICAL THERAPY TREATMENT NOTE   Patient Name: [name] MRN: [id number] DOB:[birth year], 75 y.o., male Today's Date: 11/08/2021  PCP: [name], [name] G, MD REFERRING PROVIDER: [name], [name] G, MD   PT End of Session - 11/08/21 [phone]     Visit Number 2    Number of Visits 16    Date for PT Re-Evaluation 12/20/21    PT Start Time 1100    PT Stop Time 1143    PT Time Calculation (min) 43 min    Activity Tolerance Patient tolerated treatment well    Behavior During Therapy [facility] for tasks assessed/performed             Past Medical History:  Diagnosis Date   A-fib ([place]) 03/04/2021   Anemia    Arthritis    Cancer ([place])    prostate   Chronic kidney disease    blood in urine    Constipation    Coronary artery disease    Depression    Diabetes mellitus without complication ([place])    Difficult intubation    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 it was hard to get the tube down his throat   Dyspnea    Family history of breast cancer    Fatty liver    Fatty liver    Frequent headaches    GERD (gastroesophageal reflux disease)    History of chicken pox    History of fainting spells of unknown cause    History of prostate cancer    Hyperlipidemia    Hypertension    Myocardial infarction [facility])    Peripheral neuropathy    Pneumonia    Prostate cancer ([place])    PTSD (post-traumatic stress disorder)    Sleep apnea    uses Cpap   Past Surgical History:  Procedure Laterality Date   APPENDECTOMY     BIOPSY  01/15/2021   Procedure: BIOPSY;  Surgeon: Irving Copas., MD;  Location: [facility];  Service: Gastroenterology;;   CARDIAC CATHETERIZATION  06/26/2017   CARDIAC SURGERY     Triple Bypass   CHOLECYSTECTOMY  2010   COLONOSCOPY     CORONARY ARTERY BYPASS GRAFT  2004   ESOPHAGOGASTRODUODENOSCOPY (EGD) WITH PROPOFOL N/A 01/15/2021   Procedure: ESOPHAGOGASTRODUODENOSCOPY (EGD) WITH PROPOFOL;  Surgeon: Irving Copas., MD;  Location: [facility]  ENDOSCOPY;  Service: Gastroenterology;  Laterality: N/A;   EUS N/A 01/15/2021   Procedure: UPPER ENDOSCOPIC ULTRASOUND (EUS) RADIAL;  Surgeon: Irving Copas., MD;  Location: [facility];  Service: Gastroenterology;  Laterality: N/A;   EYE SURGERY Bilateral    cataract removal   FINE NEEDLE ASPIRATION  01/15/2021   Procedure: FINE NEEDLE ASPIRATION (FNA) LINEAR;  Surgeon: Irving Copas., MD;  Location: [facility] ENDOSCOPY;  Service: Gastroenterology;;   LAPAROSCOPY N/A 07/01/2021   Procedure: STAGING LAPAROSCOPY;  Surgeon: Dwan Bolt, MD;  Location: [facility];  Service: General;  Laterality: N/A;   PORTACATH PLACEMENT Right 02/21/2021   Procedure: INSERTION PORT-A-CATH;  Surgeon: Dwan Bolt, MD;  Location: [facility];  Service: General;  Laterality: Right;   SPLENECTOMY, 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   Procedure: SPLENECTOMY;  Surgeon: Dwan Bolt, MD;  Location: [facility];  Service: General;  Laterality: N/A;   TONSILLECTOMY  1958   Patient Active Problem List   Diagnosis Date Noted   Foot callus 10/21/2021   Cellulitis 09/22/2021   Gastritis, 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   Malnutrition of moderate degree 07/02/2021   Pancreatic cancer ([place]) 07/01/2021   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  Coronary artery disease of bypass graft of native heart with stable angina pectoris ([place]) 06/04/2021   Genetic testing 04/04/2021   History of prostate cancer    Family history of breast cancer    Port-A-Cath in place 03/14/2021   Atrial fibrillation ([place])    Syncope and collapse 03/04/2021   Primary cancer of body of pancreas ([place]) 01/31/2021   Chest pain of uncertain etiology 57/84/6962   Elevated lipase 10/17/2020   H/O agent Orange exposure 10/17/2020   Dyslipidemia, goal LDL below 70 10/17/2020   Hx of CABG 02/28/2020   DM (diabetes mellitus), type 2 with renal complications ([place]) 95/28/4132   Hypertension with heart disease 04/01/2019   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[place])  04/01/2019   CKD (chronic kidney disease), stage III ([place]) 04/01/2019   Peripheral neuropathy 04/01/2019   Insomnia 11/30/2018  PCP: [name], [name] G, MD   REFERRING PROVIDER: [name], [name] G, MD   REFERRING DIAG: Frequent falls    THERAPY DIAG:  Frequent falls    ONSET DATE: Cancer diagnosis February   SUBJECTIVE:    SUBJECTIVE STATEMENT: Patient has no complaints today. He has what he opens is his last chemo treatment last week.  He is not having any pain today.  PERTINENT HISTORY: Pancreatic cancer; arthritis and debility in both shoulders; dyspnea with exertion; heart attack; blood pressure fluctuation; spleen removal      PAIN:  No pain today   PRECAUTIONS: Fall   WEIGHT BEARING RESTRICTIONS No   FALLS:  Has patient fallen in last 6 months? Yes, Number of falls: 3 major falls several small losses of balance The last time he fell he slid off the edge of the bed and couldn't hold himself; A few months ago he fell in the bathroom    LIVING ENVIRONMENT: Grab bars in the house; has steps in th house, but the patient dosen;'t have to go up them    OCCUPATION: retried   Works on computers   PLOF: Independent with household mobility with device   PATIENT GOALS    To improve strength and balance        TODAY'S TREATMENT: 11/23  Seated shoulder flexion 2lb. More difficulty with left  Bicpes flexion 3x10 2lbs weight Bilateral er 3x10 yellow   LAQ red 3x10 each leg  March 3x10 each leg  Balance: Narrow base eyes open 2x30 sec hold CGA Narrow base eyes closed 2x30 sec mod A   Forward and back stepping x15 with cuing for rhythm  Side to side stepping with cuing for rhythm        Eval:  Exercises Seated Knee Extension with Resistance - 1 x daily - 7 x weekly - 3 sets - 10 reps Seated Hip Abduction with Resistance - 1 x daily - 7 x weekly - 3 sets - 10 reps Seated March - 1 x daily - 7 x weekly - 3 sets - 10 reps     PATIENT EDUCATION:  Education  details: reviewed benefits of balance training;  Person educated: Patient Education method: Explanation, Demonstration, Tactile cues, and Verbal cues Education comprehension: verbalized understanding, returned demonstration, verbal cues required, tactile cues required, and needs further education     HOME EXERCISE PROGRAM: Access Code: [email or web address] URL: [email or web address] Date: 10/25/2021 Prepared by: Carolyne Littles   Exercises Seated Knee Extension with Resistance - 1 x daily - 7 x weekly - 3 sets - 10 reps Seated Hip Abduction with Resistance - 1 x daily - 7 x weekly -  3 sets - 10 reps Seated March - 1 x daily - 7 x weekly - 3 sets - 10 reps     ASSESSMENT:   CLINICAL IMPRESSION: Patient tolerated treatment well. He was able to do the nu-step for endurance. We added in an UE series. His left shoulder is very limited. He was strongly advised not to overstress his shoulder .  He has some weights at home, but he isn't sure what size they are. He required significantly more assist with eyes closed balance.    Objective impairments include Abnormal gait, decreased activity tolerance, decreased balance, decreased endurance, decreased mobility, difficulty walking, decreased ROM, decreased strength, impaired UE functional use, and pain. These impairments are limiting patient from cleaning, driving, meal prep, yard work, and shopping. Personal factors including Age, Fitness, and 1-2 comorbidities: significant arthritis in his shoulders and peripheral neuropathy  are also affecting patient's functional outcome. Patient will benefit from skilled PT to address above impairments and improve overall function.   REHAB POTENTIAL: Good   CLINICAL DECISION MAKING: Evolving/moderate complexity more falls and declining mobility    EVALUATION COMPLEXITY: Moderate     GOALS: Goals reviewed with patient? Yes   SHORT TERM GOALS:   STG Name Target Date Goal status  1 Patient will increase  gross LE strength by 5 lbs  Baseline:  11/22/2021 INITIAL  2 Patient will require min a with tandem stance with eyes closed Baseline:  11/22/2021 INITIAL  3 Patient will ambulate 300' with improved posture and hip flexion  Baseline: 11/22/2021 INITIAL  LONG TERM GOALS:    LTG Name Target Date Goal status  1 BERG balance goal to be established  Baseline: 12/20/2021 INITIAL  2 Patient wil have no falls for a 2 week time period in order to improve safety  Baseline: 12/20/2021 INITIAL  3 Patient will report improved ability to perfrom ADLs without assistance  Baseline: 12/20/2021 INITIAL  PLAN: PT FREQUENCY: 2x/week   PT DURATION: 8 weeks   PLANNED INTERVENTIONS: Therapeutic exercises, Therapeutic activity, Neuro Muscular re-education, Balance training, Gait training, Patient/Family education, Joint mobilization, Stair training, DME instructions, Aquatic Therapy, Electrical stimulation, Cryotherapy, Moist heat, Taping, and Manual therapy   PLAN FOR NEXT SESSION: Patient may need lift to get into the pool. In the pool work on gait, balance, and hip strengthening. On land develop his HEP for home including seated UE and LE exercises       Carney Living PT DPT  11/08/2021, 7:45 AM

## 2021-11-08 ENCOUNTER — Encounter (HOSPITAL_BASED_OUTPATIENT_CLINIC_OR_DEPARTMENT_OTHER): Payer: Self-pay | Admitting: Physical Therapy

## 2021-11-11 ENCOUNTER — Encounter (HOSPITAL_BASED_OUTPATIENT_CLINIC_OR_DEPARTMENT_OTHER): Payer: Medicare Other | Admitting: Physical Therapy

## 2021-11-13 ENCOUNTER — Encounter: Payer: Self-pay | Admitting: Family Medicine

## 2021-11-13 MED ORDER — CLOTRIMAZOLE-BETAMETHASONE 1-0.05 % EX CREA: 1.0000 "application " | TOPICAL_CREAM | Freq: Every day | CUTANEOUS | 2 refills | Status: DC | PRN

## 2021-11-13 MED ORDER — FERROUS SULFATE 325 (65 FE) MG PO TABS: 325.0000 mg | ORAL_TABLET | Freq: Every day | ORAL | 2 refills | Status: DC

## 2021-11-13 MED ORDER — NYSTATIN 100000 UNIT/GM EX POWD: 1.0000 "application " | Freq: Three times a day (TID) | CUTANEOUS | 2 refills | Status: DC

## 2021-11-15 ENCOUNTER — Encounter (HOSPITAL_BASED_OUTPATIENT_CLINIC_OR_DEPARTMENT_OTHER): Payer: Self-pay | Admitting: Physical Therapy

## 2021-11-15 ENCOUNTER — Ambulatory Visit (HOSPITAL_BASED_OUTPATIENT_CLINIC_OR_DEPARTMENT_OTHER): Payer: Medicare Other | Attending: Family Medicine | Admitting: Physical Therapy

## 2021-11-15 ENCOUNTER — Other Ambulatory Visit: Payer: Self-pay

## 2021-11-15 DIAGNOSIS — M6281 Muscle weakness (generalized): Secondary | ICD-10-CM | POA: Insufficient documentation

## 2021-11-15 DIAGNOSIS — R296 Repeated falls: Secondary | ICD-10-CM | POA: Diagnosis not present

## 2021-11-15 DIAGNOSIS — R2689 Other abnormalities of gait and mobility: Secondary | ICD-10-CM | POA: Diagnosis not present

## 2021-11-15 NOTE — Therapy (Signed)
OUTPATIENT PHYSICAL THERAPY TREATMENT NOTE   Patient Name: [name] MRN: [id number] DOB:1946-02-23, 75 y.o., male Today's Date: 11/15/2021  PCP: [name], [name] G, MD REFERRING PROVIDER: [name], [name] G, MD    Past Medical History:  Diagnosis Date   A-fib [facility]) 03/04/2021   Anemia    Arthritis    Cancer ([place])    prostate   Chronic kidney disease    blood in urine    Constipation    Coronary artery disease    Depression    Diabetes mellitus without complication ([place])    Difficult intubation    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 it was hard to get the tube down his throat   Dyspnea    Family history of breast cancer    Fatty liver    Fatty liver    Frequent headaches    GERD (gastroesophageal reflux disease)    History of chicken pox    History of fainting spells of unknown cause    History of prostate cancer    Hyperlipidemia    Hypertension    Myocardial infarction [facility])    Peripheral neuropathy    Pneumonia    Prostate cancer ([place])    PTSD (post-traumatic stress disorder)    Sleep apnea    uses Cpap   Past Surgical History:  Procedure Laterality Date   APPENDECTOMY     BIOPSY  01/15/2021   Procedure: BIOPSY;  Surgeon: Irving Copas., MD;  Location: [place];  Service: Gastroenterology;;   CARDIAC CATHETERIZATION  06/26/2017   CARDIAC SURGERY     Triple Bypass   CHOLECYSTECTOMY  2010   COLONOSCOPY     CORONARY ARTERY BYPASS GRAFT  2004   ESOPHAGOGASTRODUODENOSCOPY (EGD) WITH PROPOFOL N/A 01/15/2021   Procedure: ESOPHAGOGASTRODUODENOSCOPY (EGD) WITH PROPOFOL;  Surgeon: Irving Copas., MD;  Location[facility] ENDOSCOPY;  Service: Gastroenterology;  Laterality: N/A;   EUS N/A 01/15/2021   Procedure: UPPER ENDOSCOPIC ULTRASOUND (EUS) RADIAL;  Surgeon: Irving Copas., MD;  Location: [place];  Service: Gastroenterology;  Laterality: N/A;   EYE SURGERY Bilateral    cataract removal   FINE NEEDLE ASPIRATION  01/15/2021   Procedure:  FINE NEEDLE ASPIRATION (FNA) LINEAR;  Surgeon: Irving Copas., MD;  Location: [place];  Service: Gastroenterology;;   LAPAROSCOPY N/A 07/01/2021   Procedure: STAGING LAPAROSCOPY;  Surgeon: Dwan Bolt, MD;  Location: [place];  Service: General;  Laterality: N/A;   PORTACATH PLACEMENT Right 02/21/2021   Procedure: INSERTION PORT-A-CATH;  Surgeon: Dwan Bolt, MD;  Location: [place];  Service: General;  Laterality: Right;   SPLENECTOMY, 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   Procedure: SPLENECTOMY;  Surgeon: Dwan Bolt, MD;  Location: [place];  Service: General;  Laterality: N/A;   TONSILLECTOMY  1958   Patient Active Problem List   Diagnosis Date Noted   Foot callus 10/21/2021   Cellulitis 09/22/2021   Gastritis, 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   Malnutrition of moderate degree 07/02/2021   Pancreatic cancer ([place]) 07/01/2021   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/01/2021   Coronary artery disease of bypass graft of native heart with stable angina pectoris ([place]) 06/04/2021   Genetic testing 04/04/2021   History of prostate cancer    Family history of breast cancer    Port-A-Cath in place 03/14/2021   Atrial fibrillation ([place])    Syncope and collapse 03/04/2021   Primary cancer of body of pancreas ([place]) 01/31/2021   Chest pain of uncertain etiology 97/67/3419   Elevated lipase 10/17/2020   H/O agent  Orange exposure 10/17/2020   Dyslipidemia, goal LDL below 70 10/17/2020   Hx of CABG 02/28/2020   DM (diabetes mellitus), type 2 with renal complications ([place]) 48/54/6270   Hypertension with heart disease 04/01/2019   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[place]) 04/01/2019   CKD (chronic kidney disease), stage III ([place]) 04/01/2019   Peripheral neuropathy 04/01/2019   Insomnia 11/30/2018   PCP: [name], [name] G, MD   REFERRING PROVIDER: [name], [name] G, MD   REFERRING DIAG: Frequent falls    THERAPY DIAG:  Frequent falls    ONSET DATE: Cancer diagnosis February   SUBJECTIVE:    SUBJECTIVE  STATEMENT: Patient has no complaints today. He felt pretty good after the last visit.   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 Yes, Number of falls: 3 major falls several small losses of balance The last time he fell he slid off the edge of the bed and couldn't hold himself; A few months ago he fell in the bathroom    LIVING ENVIRONMENT: Grab bars in the house; has steps in th house, but the patient dosen;'t have to go up them    OCCUPATION: retried   Works on computers   PLOF: Independent with household mobility with device   PATIENT GOALS    To improve strength and balance        TODAY'S TREATMENT: 12/2  Nu-step 6 min  LAQ red 3x10 each leg  March 3x10 each leg red  Hamstring curl red 3x10 red  Bilateral er 3x10 red   11/23   Seated shoulder flexion 2lb. More difficulty with left  Bicpes flexion 3x10 2lbs weight Bilateral er 3x10 yellow    LAQ red 3x10 each leg  March 3x10 each leg   Balance: Narrow base eyes open 2x30 sec hold CGA Narrow base eyes closed 2x30 sec mod A    Forward and back stepping x15 with cuing for rhythm  Side to side stepping with cuing for rhythm          Eval:  Exercises Seated Knee Extension with Resistance - 1 x daily - 7 x weekly - 3 sets - 10 reps Seated Hip Abduction with Resistance - 1 x daily - 7 x weekly - 3 sets - 10 reps Seated March - 1 x daily - 7 x weekly - 3 sets - 10 reps     PATIENT EDUCATION:  Education details: reviewed benefits of balance training;  Person educated: Patient Education method: Explanation, Demonstration, Tactile cues, and Verbal cues Education comprehension: verbalized understanding, returned demonstration, verbal cues required, tactile cues  required, and needs further education     HOME EXERCISE PROGRAM: Access Code: [email or web address] URL: [email or web address] Date: 10/25/2021 Prepared by: Carolyne Littles   Exercises Seated Knee Extension with Resistance - 1 x daily - 7 x weekly - 3 sets - 10 reps Seated Hip Abduction with Resistance - 1 x daily - 7 x weekly - 3 sets - 10 reps Seated March - 1 x daily - 7 x weekly - 3 sets - 10 reps     ASSESSMENT:   CLINICAL IMPRESSION: Patient tolerated there-ex  well. He had no increase in pain but mild dyspnea with exertion. He tolerated standing balance exercises well. Therapy  will continue to advance his balance exercises as tolerated. He was encouraged to do his exercises at home. He reports he has not been able to do them much. Therapy added standing exercises at the counter.    Objective impairments include Abnormal gait, decreased activity tolerance, decreased balance, decreased endurance, decreased mobility, difficulty walking, decreased ROM, decreased strength, impaired UE functional use, and pain. These impairments are limiting patient from cleaning, driving, meal prep, yard work, and shopping. Personal factors including Age, Fitness, and 1-2 comorbidities: significant arthritis in his shoulders and peripheral neuropathy  are also affecting patient's functional outcome. Patient will benefit from skilled PT to address above impairments and improve overall function.   REHAB POTENTIAL: Good   CLINICAL DECISION MAKING: Evolving/moderate complexity more falls and declining mobility    EVALUATION COMPLEXITY: Moderate     GOALS: Goals reviewed with patient? Yes   SHORT TERM GOALS:   STG Name Target Date Goal status  1 Patient will increase gross LE strength by 5 lbs  Baseline:  11/22/2021 INITIAL  2 Patient will require min a with tandem stance with eyes closed Baseline:  11/22/2021 INITIAL  3 Patient will ambulate 300' with improved posture and hip flexion  Baseline:  11/22/2021 INITIAL  LONG TERM GOALS:    LTG Name Target Date Goal status  1 BERG balance goal to be established  Baseline: 12/20/2021 INITIAL  2 Patient wil have no falls for a 2 week time period in order to improve safety  Baseline: 12/20/2021 INITIAL  3 Patient will report improved ability to perfrom ADLs without assistance  Baseline: 12/20/2021 INITIAL  PLAN: PT FREQUENCY: 2x/week   PT DURATION: 8 weeks   PLANNED INTERVENTIONS: Therapeutic exercises, Therapeutic activity, Neuro Muscular re-education, Balance training, Gait training, Patient/Family education, Joint mobilization, Stair training, DME instructions, Aquatic Therapy, Electrical stimulation, Cryotherapy, Moist heat, Taping, and Manual therapy   PLAN FOR NEXT SESSION: Patient may need lift to get into the pool. In the pool work on gait, balance, and hip strengthening. On land develop his HEP for home including seated UE and LE exercises; assess tolerance to exercises        Carney Living PT DPT  11/15/2021, 3:23 PM

## 2021-11-18 ENCOUNTER — Encounter (HOSPITAL_BASED_OUTPATIENT_CLINIC_OR_DEPARTMENT_OTHER): Payer: Self-pay | Admitting: Physical Therapy

## 2021-11-18 ENCOUNTER — Ambulatory Visit (HOSPITAL_BASED_OUTPATIENT_CLINIC_OR_DEPARTMENT_OTHER): Payer: Medicare Other | Admitting: Physical Therapy

## 2021-11-18 ENCOUNTER — Other Ambulatory Visit: Payer: Self-pay

## 2021-11-18 DIAGNOSIS — R2689 Other abnormalities of gait and mobility: Secondary | ICD-10-CM

## 2021-11-18 DIAGNOSIS — R296 Repeated falls: Secondary | ICD-10-CM

## 2021-11-18 DIAGNOSIS — M6281 Muscle weakness (generalized): Secondary | ICD-10-CM

## 2021-11-18 NOTE — Therapy (Signed)
OUTPATIENT PHYSICAL THERAPY TREATMENT NOTE   Patient Name: [name] MRN: [id number] DOB:Mar 08, 1946, 75 y.o., male Today's Date: 11/19/2021  PCP: [name], [name] G, MD REFERRING PROVIDER: [name], [name] G, MD   PT End of Session - 11/18/21 1106     Visit Number 4    Number of Visits 16    Date for PT Re-Evaluation 12/20/21    PT Start Time 1100    PT Stop Time 1660    PT Time Calculation (min) 42 min    Activity Tolerance Patient tolerated treatment well    Behavior During Therapy [facility] for tasks assessed/performed             Past Medical History:  Diagnosis Date   A-fib ([place]) 03/04/2021   Anemia    Arthritis    Cancer ([place])    prostate   Chronic kidney disease    blood in urine    Constipation    Coronary artery disease    Depression    Diabetes mellitus without complication ([place])    Difficult intubation    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 it was hard to get the tube down his throat   Dyspnea    Family history of breast cancer    Fatty liver    Fatty liver    Frequent headaches    GERD (gastroesophageal reflux disease)    History of chicken pox    History of fainting spells of unknown cause    History of prostate cancer    Hyperlipidemia    Hypertension    Myocardial infarction [facility])    Peripheral neuropathy    Pneumonia    Prostate cancer ([place])    PTSD (post-traumatic stress disorder)    Sleep apnea    uses Cpap   Past Surgical History:  Procedure Laterality Date   APPENDECTOMY     BIOPSY  01/15/2021   Procedure: BIOPSY;  Surgeon: Irving Copas., MD;  Location: [place];  Service: Gastroenterology;;   CARDIAC CATHETERIZATION  06/26/2017   CARDIAC SURGERY     Triple Bypass   CHOLECYSTECTOMY  2010   COLONOSCOPY     CORONARY ARTERY BYPASS GRAFT  2004   ESOPHAGOGASTRODUODENOSCOPY (EGD) WITH PROPOFOL N/A 01/15/2021   Procedure: ESOPHAGOGASTRODUODENOSCOPY (EGD) WITH PROPOFOL;  Surgeon: Irving Copas., MD;  Location[facility] END[facility]COPY;   Service: Gastroenterology;  Laterality: N/A;   EUS N/A 01/15/2021   Procedure: UPPER ENDOSCOPIC ULTRASOUND (EUS) RADIAL;  Surgeon: Irving Copas., MD;  Location: [place];  Service: Gastroenterology;  Laterality: N/A;   EYE SURGERY Bilateral    cataract removal   FINE NEEDLE ASPIRATION  01/15/2021   Procedure: FINE NEEDLE ASPIRATION (FNA) LINEAR;  Surgeon: Irving Copas., MD;  Location: [facility] ENDOSCOPY;  Service: Gastroenterology;;   LAPAROSCOPY N/A 07/01/2021   Procedure: STAGING LAPAROSCOPY;  Surgeon: Dwan Bolt, MD;  Location: [place];  Service: General;  Laterality: N/A;   PORTACATH PLACEMENT Right 02/21/2021   Procedure: INSERTION PORT-A-CATH;  Surgeon: Dwan Bolt, MD;  Location: [place];  Service: General;  Laterality: Right;   SPLENECTOMY, 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   Procedure: SPLENECTOMY;  Surgeon: Dwan Bolt, MD;  Location: [place];  Service: General;  Laterality: N/A;   TONSILLECTOMY  1958   Patient Active Problem List   Diagnosis Date Noted   Foot callus 10/21/2021   Cellulitis 09/22/2021   Gastritis, 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   Malnutrition of moderate degree 07/02/2021   Pancreatic cancer ([place]) 07/01/2021   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  Coronary artery disease of bypass graft of native heart with stable angina pectoris ([place]) 06/04/2021   Genetic testing 04/04/2021   History of prostate cancer    Family history of breast cancer    Port-A-Cath in place 03/14/2021   Atrial fibrillation ([place])    Syncope and collapse 03/04/2021   Primary cancer of body of pancreas ([place]) 01/31/2021   Chest pain of uncertain etiology 10/08/8526   Elevated lipase 10/17/2020   H/O agent Orange exposure 10/17/2020   Dyslipidemia, goal LDL below 70 10/17/2020   Hx of CABG 02/28/2020   DM (diabetes mellitus), type 2 with renal complications ([place]) 78/24/2353   Hypertension with heart disease 04/01/2019   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[place]) 04/01/2019   CKD  (chronic kidney disease), stage III ([place]) 04/01/2019   Peripheral neuropathy 04/01/2019   Insomnia 11/30/2018     PCP: [name], [name] G, MD   REFERRING PROVIDER: [name], [name] G, MD   REFERRING DIAG: Frequent falls    THERAPY DIAG:  Frequent falls    ONSET DATE: Cancer diagnosis February   SUBJECTIVE:    SUBJECTIVE STATEMENT: Patient has no complaints today. He felt pretty good after the last visit. He still has not been able to do many exercises at home.    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 Yes, Number of falls: 3 major falls several small losses of balance The last time he fell he slid off the edge of the bed and couldn't hold himself; A few months ago he fell in the bathroom    LIVING ENVIRONMENT: Grab bars in the house; has steps in th house, but the patient dosen;'t have to go up them    OCCUPATION: retried   Works on computers   PLOF: Independent with household mobility with device   PATIENT GOALS    To improve strength and balance        TODAY'S TREATMENT: 12/2   Nu-step 6 min  LAQ red 3x10 each leg  March 3x10 each leg red  Hamstring curl red 3x10 red  Bilateral er 3x10 red  Row 2x10 red  Shoulder extension 2x10 red   Balance: Narrow base eyes open 2x30 sec hold CGA Narrow base eyes closed 2x30 sec mod A   Tandem stance 2x20 sec hold mod a 1/2 tandem   Tried standing on air-ex but max a required    11/23   Seated shoulder flexion 2lb. More difficulty with left  Bicpes flexion 3x10 2lbs weight Bilateral er 3x10 yellow    LAQ red 3x10 each leg  March 3x10 each leg   Balance: Narrow base eyes open 2x30 sec hold CGA Narrow base eyes closed 2x30 sec mod A    Forward and back stepping  x15 with cuing for rhythm  Side to side stepping with cuing for rhythm          Eval:  Exercises Seated Knee Extension with Resistance - 1 x daily - 7 x weekly - 3 sets - 10 reps Seated Hip Abduction with Resistance - 1 x daily - 7 x weekly - 3 sets - 10 reps Seated March - 1 x daily - 7 x weekly - 3 sets - 10 reps  PATIENT EDUCATION:  Education details: reviewed benefits of balance training;  Person educated: Patient Education method: Explanation, Demonstration, Tactile cues, and Verbal cues Education comprehension: verbalized understanding, returned demonstration, verbal cues required, tactile cues required, and needs further education     HOME EXERCISE PROGRAM: Access Code: [email or web address] URL: [email or web address] Date: 10/25/2021 Prepared by: Carolyne Littles   Exercises Seated Knee Extension with Resistance - 1 x daily - 7 x weekly - 3 sets - 10 reps Seated Hip Abduction with Resistance - 1 x daily - 7 x weekly - 3 sets - 10 reps Seated March - 1 x daily - 7 x weekly - 3 sets - 10 reps     ASSESSMENT:   CLINICAL IMPRESSION: Patient was unable to maintain balance on air-ex. He required mod a with tandem stance. He tolerated there-ex well but had more dyspnea with rows and extension exercises. Therapy continues to encourage the patient to work on his exercises at home. We will continue to progress there-ex as tolerated. He feels he may be more motivated to perform exercises in the gym.  Objective impairments include Abnormal gait, decreased activity tolerance, decreased balance, decreased endurance, decreased mobility, difficulty walking, decreased ROM, decreased strength, impaired UE functional use, and pain. These impairments are limiting patient from cleaning, driving, meal prep, yard work, and shopping. Personal factors including Age, Fitness, and 1-2 comorbidities: significant arthritis in his shoulders and peripheral neuropathy  are also affecting patient's  functional outcome. Patient will benefit from skilled PT to address above impairments and improve overall function.   REHAB POTENTIAL: Good   CLINICAL DECISION MAKING: Evolving/moderate complexity more falls and declining mobility    EVALUATION COMPLEXITY: Moderate     GOALS: Goals reviewed with patient? Yes   SHORT TERM GOALS:   STG Name Target Date Goal status  1 Patient will increase gross LE strength by 5 lbs  Baseline:  11/22/2021 INITIAL  2 Patient will require min a with tandem stance with eyes closed Baseline:  11/22/2021 INITIAL  3 Patient will ambulate 300' with improved posture and hip flexion  Baseline: 11/22/2021 INITIAL  LONG TERM GOALS:    LTG Name Target Date Goal status  1 BERG balance goal to be established  Baseline: 12/20/2021 INITIAL  2 Patient wil have no falls for a 2 week time period in order to improve safety  Baseline: 12/20/2021 INITIAL  3 Patient will report improved ability to perfrom ADLs without assistance  Baseline: 12/20/2021 INITIAL  PLAN: PT FREQUENCY: 2x/week   PT DURATION: 8 weeks   PLANNED INTERVENTIONS: Therapeutic exercises, Therapeutic activity, Neuro Muscular re-education, Balance training, Gait training, Patient/Family education, Joint mobilization, Stair training, DME instructions, Aquatic Therapy, Electrical stimulation, Cryotherapy, Moist heat, Taping, and Manual therapy   PLAN FOR NEXT SESSION: Patient may need lift to get into the pool. In the pool work on gait, balance, and hip strengthening. On land develop his HEP for home including seated UE and LE exercises; assess tolerance to exercises          Carney Living PT DPT  11/19/2021, 1:16 PM

## 2021-11-19 ENCOUNTER — Encounter (HOSPITAL_BASED_OUTPATIENT_CLINIC_OR_DEPARTMENT_OTHER): Payer: Self-pay | Admitting: Physical Therapy

## 2021-11-20 ENCOUNTER — Other Ambulatory Visit: Payer: Self-pay

## 2021-11-20 ENCOUNTER — Ambulatory Visit (HOSPITAL_BASED_OUTPATIENT_CLINIC_OR_DEPARTMENT_OTHER): Payer: Medicare Other | Admitting: Physical Therapy

## 2021-11-20 ENCOUNTER — Encounter (HOSPITAL_BASED_OUTPATIENT_CLINIC_OR_DEPARTMENT_OTHER): Payer: Self-pay | Admitting: Physical Therapy

## 2021-11-20 DIAGNOSIS — M6281 Muscle weakness (generalized): Secondary | ICD-10-CM | POA: Diagnosis not present

## 2021-11-20 DIAGNOSIS — R296 Repeated falls: Secondary | ICD-10-CM

## 2021-11-20 DIAGNOSIS — R2689 Other abnormalities of gait and mobility: Secondary | ICD-10-CM | POA: Diagnosis not present

## 2021-11-20 NOTE — Therapy (Signed)
OUTPATIENT PHYSICAL THERAPY TREATMENT NOTE   Patient Name: [name] MRN: [id number] DOB:08-Dec-1946, 75 y.o., male Today's Date: 11/20/2021  PCP: [name], [name] G, MD REFERRING PROVIDER: [name], [name] G, MD   PT End of Session - 11/20/21 1211     Visit Number 5    Number of Visits 16    Date for PT Re-Evaluation 12/20/21    PT Start Time 2330    PT Stop Time 1226    PT Time Calculation (min) 41 min    Activity Tolerance Patient tolerated treatment well    Behavior During Therapy [facility] for tasks assessed/performed             Past Medical History:  Diagnosis Date   A-fib ([place]) 03/04/2021   Anemia    Arthritis    Cancer ([place])    prostate   Chronic kidney disease    blood in urine    Constipation    Coronary artery disease    Depression    Diabetes mellitus without complication ([place])    Difficult intubation    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 it was hard to get the tube down his throat   Dyspnea    Family history of breast cancer    Fatty liver    Fatty liver    Frequent headaches    GERD (gastroesophageal reflux disease)    History of chicken pox    History of fainting spells of unknown cause    History of prostate cancer    Hyperlipidemia    Hypertension    Myocardial infarction [facility])    Peripheral neuropathy    Pneumonia    Prostate cancer ([place])    PTSD (post-traumatic stress disorder)    Sleep apnea    uses Cpap   Past Surgical History:  Procedure Laterality Date   APPENDECTOMY     BIOPSY  01/15/2021   Procedure: BIOPSY;  Surgeon: Irving Copas., MD;  Location: [place];  Service: Gastroenterology;;   CARDIAC CATHETERIZATION  06/26/2017   CARDIAC SURGERY     Triple Bypass   CHOLECYSTECTOMY  2010   COLONOSCOPY     CORONARY ARTERY BYPASS GRAFT  2004   ESOPHAGOGASTRODUODENOSCOPY (EGD) WITH PROPOFOL N/A 01/15/2021   Procedure: ESOPHAGOGASTRODUODENOSCOPY (EGD) WITH PROPOFOL;  Surgeon: Irving Copas., MD;  Location[facility] ENDOSCOPY;   Service: Gastroenterology;  Laterality: N/A;   EUS N/A 01/15/2021   Procedure: UPPER ENDOSCOPIC ULTRASOUND (EUS) RADIAL;  Surgeon: Irving Copas., MD;  Location: [place];  Service: Gastroenterology;  Laterality: N/A;   EYE SURGERY Bilateral    cataract removal   FINE NEEDLE ASPIRATION  01/15/2021   Procedure: FINE NEEDLE ASPIRATION (FNA) LINEAR;  Surgeon: Irving Copas., MD;  Location: [facility] ENDOSCOPY;  Service: Gastroenterology;;   LAPAROSCOPY N/A 07/01/2021   Procedure: STAGING LAPAROSCOPY;  Surgeon: Dwan Bolt, MD;  Location: [place];  Service: General;  Laterality: N/A;   PORTACATH PLACEMENT Right 02/21/2021   Procedure: INSERTION PORT-A-CATH;  Surgeon: Dwan Bolt, MD;  Location: [place];  Service: General;  Laterality: Right;   SPLENECTOMY, 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   Procedure: SPLENECTOMY;  Surgeon: Dwan Bolt, MD;  Location: [place];  Service: General;  Laterality: N/A;   TONSILLECTOMY  1958   Patient Active Problem List   Diagnosis Date Noted   Foot callus 10/21/2021   Cellulitis 09/22/2021   Gastritis, 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   Malnutrition of moderate degree 07/02/2021   Pancreatic cancer ([place]) 07/01/2021   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  Coronary artery disease of bypass graft of native heart with stable angina pectoris ([place]) 06/04/2021   Genetic testing 04/04/2021   History of prostate cancer    Family history of breast cancer    Port-A-Cath in place 03/14/2021   Atrial fibrillation ([place])    Syncope and collapse 03/04/2021   Primary cancer of body of pancreas ([place]) 01/31/2021   Chest pain of uncertain etiology 74/94/4967   Elevated lipase 10/17/2020   H/O agent Orange exposure 10/17/2020   Dyslipidemia, goal LDL below 70 10/17/2020   Hx of CABG 02/28/2020   DM (diabetes mellitus), type 2 with renal complications ([place]) 59/16/3846   Hypertension with heart disease 04/01/2019   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[place]) 04/01/2019   CKD  (chronic kidney disease), stage III ([place]) 04/01/2019   Peripheral neuropathy 04/01/2019   Insomnia 11/30/2018     PCP: [name], [name] G, MD   REFERRING PROVIDER: [name], [name] G, MD   REFERRING DIAG: Frequent falls    THERAPY DIAG:  Frequent falls    ONSET DATE: Cancer diagnosis February   SUBJECTIVE:    SUBJECTIVE STATEMENT: Patient has no complaints today. He felt pretty good after the last visit. He still has not been able to do many exercises at home.    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 Yes, Number of falls: 3 major falls several small losses of balance The last time he fell he slid off the edge of the bed and couldn't hold himself; A few months ago he fell in the bathroom    LIVING ENVIRONMENT: Grab bars in the house; has steps in th house, but the patient dosen;'t have to go up them    OCCUPATION: retried   Works on computers   PLOF: Independent with household mobility with device   PATIENT GOALS    To improve strength and balance        TODAY'S TREATMENT: 12/7 Nu-step 5 min UE/LE  LAQ 2x10 2lb  Seated March 2lb 2x10  Hip abduction 3x10 red Hip adduction 3x10 ball   Heel raise: 2x15  Standing march 2x10   Narrow base eyes open 3x30  Tandem stance 2x20 sec each     12/2   Nu-step 6 min  LAQ red 3x10 each leg  March 3x10 each leg red  Hamstring curl red 3x10 red  Bilateral er 3x10 red  Row 2x10 red  Shoulder extension 2x10 red    Balance: Narrow base eyes open 2x30 sec hold CGA Narrow base eyes closed 2x30 sec mod A    Tandem stance 2x20 sec hold mod a 1/2 tandem    Tried standing on air-ex but max a required    11/23   Seated shoulder flexion 2lb. More difficulty with left   Bicpes flexion 3x10 2lbs weight Bilateral er 3x10 yellow    LAQ red 3x10 each leg  March 3x10 each leg   Balance: Narrow base eyes open 2x30 sec hold CGA Narrow base eyes closed 2x30 sec mod A    Forward and back stepping x15 with cuing for rhythm  Side to side stepping with cuing for rhythm          Eval:  Exercises Seated Knee Extension with Resistance -  1 x daily - 7 x weekly - 3 sets - 10 reps Seated Hip Abduction with Resistance - 1 x daily - 7 x weekly - 3 sets - 10 reps Seated March - 1 x daily - 7 x weekly - 3 sets - 10 reps     PATIENT EDUCATION:  Education details: reviewed benefits of balance training;  Person educated: Patient Education method: Explanation, Demonstration, Tactile cues, and Verbal cues Education comprehension: verbalized understanding, returned demonstration, verbal cues required, tactile cues required, and needs further education     HOME EXERCISE PROGRAM: Access Code: [email or web address] URL: [email or web address] Date: 10/25/2021 Prepared by: Carolyne Littles   Exercises Seated Knee Extension with Resistance - 1 x daily - 7 x weekly - 3 sets - 10 reps Seated Hip Abduction with Resistance - 1 x daily - 7 x weekly - 3 sets - 10 reps Seated March - 1 x daily - 7 x weekly - 3 sets - 10 reps     ASSESSMENT:   CLINICAL IMPRESSION: Patient reported mild fatigue but overall he did well. He flet like the two pound weights were light, but he did report that he could feel them after the treatment. He did well with tandem stance today only requiring min a and the same with narrow base.  Therapy kept his HEP consitent today. We will perform a six minute walk test next visit and continue to progress.    Objective impairments include Abnormal gait, decreased activity tolerance, decreased balance, decreased endurance, decreased mobility, difficulty walking, decreased ROM, decreased strength, impaired UE functional use, and pain. These impairments are  limiting patient from cleaning, driving, meal prep, yard work, and shopping. Personal factors including Age, Fitness, and 1-2 comorbidities: significant arthritis in his shoulders and peripheral neuropathy  are also affecting patient's functional outcome. Patient will benefit from skilled PT to address above impairments and improve overall function.   REHAB POTENTIAL: Good   CLINICAL DECISION MAKING: Evolving/moderate complexity more falls and declining mobility    EVALUATION COMPLEXITY: Moderate     GOALS: Goals reviewed with patient? Yes   SHORT TERM GOALS:   STG Name Target Date Goal status  1 Patient will increase gross LE strength by 5 lbs  Baseline:  11/22/2021 INITIAL  2 Patient will require min a with tandem stance with eyes closed Baseline:  11/22/2021 INITIAL  3 Patient will ambulate 300' with improved posture and hip flexion  Baseline: 11/22/2021 INITIAL  LONG TERM GOALS:    LTG Name Target Date Goal status  1 BERG balance goal to be established  Baseline: 12/20/2021 INITIAL  2 Patient wil have no falls for a 2 week time period in order to improve safety  Baseline: 12/20/2021 INITIAL  3 Patient will report improved ability to perfrom ADLs without assistance  Baseline: 12/20/2021 INITIAL  PLAN: PT FREQUENCY: 2x/week   PT DURATION: 8 weeks   PLANNED INTERVENTIONS: Therapeutic exercises, Therapeutic activity, Neuro Muscular re-education, Balance training, Gait training, Patient/Family education, Joint mobilization, Stair training, DME instructions, Aquatic Therapy, Electrical stimulation, Cryotherapy, Moist heat, Taping, and Manual therapy   PLAN FOR NEXT SESSION: Patient may need lift to get into the pool. In the pool work on gait, balance, and hip strengthening. On land develop his HEP for home including seated UE and LE exercises; assess tolerance to exercises          Carney Living PT DPT  11/20/2021, 1:24 PM

## 2021-11-25 ENCOUNTER — Encounter (HOSPITAL_BASED_OUTPATIENT_CLINIC_OR_DEPARTMENT_OTHER): Payer: Self-pay | Admitting: Physical Therapy

## 2021-11-25 ENCOUNTER — Other Ambulatory Visit: Payer: Self-pay

## 2021-11-25 ENCOUNTER — Ambulatory Visit (HOSPITAL_BASED_OUTPATIENT_CLINIC_OR_DEPARTMENT_OTHER): Payer: Medicare Other | Admitting: Physical Therapy

## 2021-11-25 DIAGNOSIS — R2689 Other abnormalities of gait and mobility: Secondary | ICD-10-CM

## 2021-11-25 DIAGNOSIS — R296 Repeated falls: Secondary | ICD-10-CM

## 2021-11-25 DIAGNOSIS — M6281 Muscle weakness (generalized): Secondary | ICD-10-CM | POA: Diagnosis not present

## 2021-11-25 NOTE — Therapy (Signed)
OUTPATIENT PHYSICAL THERAPY TREATMENT NOTE   Patient Name: [name] MRN: [id number] DOB:03-06-1946, 75 y.o., male Today's Date: 11/25/2021  PCP: [name], [name] G, MD REFERRING PROVIDER: [name], [name] G, MD   PT End of Session - 11/25/21 1250     Visit Number 6    Number of Visits 16    Date for PT Re-Evaluation 12/20/21    PT Start Time 8299    PT Stop Time 1225    PT Time Calculation (min) 40 min    Activity Tolerance Patient tolerated treatment well    Behavior During Therapy [facility] for tasks assessed/performed             Past Medical History:  Diagnosis Date   A-fib ([place]) 03/04/2021   Anemia    Arthritis    Cancer ([place])    prostate   Chronic kidney disease    blood in urine    Constipation    Coronary artery disease    Depression    Diabetes mellitus without complication ([place])    Difficult intubation    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 it was hard to get the tube down his throat   Dyspnea    Family history of breast cancer    Fatty liver    Fatty liver    Frequent headaches    GERD (gastroesophageal reflux disease)    History of chicken pox    History of fainting spells of unknown cause    History of prostate cancer    Hyperlipidemia    Hypertension    Myocardial infarction [facility])    Peripheral neuropathy    Pneumonia    Prostate cancer ([place])    PTSD (post-traumatic stress disorder)    Sleep apnea    uses Cpap   Past Surgical History:  Procedure Laterality Date   APPENDECTOMY     BIOPSY  01/15/2021   Procedure: BIOPSY;  Surgeon: Irving Copas., MD;  Location: [place];  Service: Gastroenterology;;   CARDIAC CATHETERIZATION  06/26/2017   CARDIAC SURGERY     Triple Bypass   CHOLECYSTECTOMY  2010   COLONOSCOPY     CORONARY ARTERY BYPASS GRAFT  2004   ESOPHAGOGASTRODUODENOSCOPY (EGD) WITH PROPOFOL N/A 01/15/2021   Procedure: ESOPHAGOGASTRODUODENOSCOPY (EGD) WITH PROPOFOL;  Surgeon: Irving Copas., MD;  Location: [facility]  ENDOSCOPY;  Service: Gastroenterology;  Laterality: N/A;   EUS N/A 01/15/2021   Procedure: UPPER ENDOSCOPIC ULTRASOUND (EUS) RADIAL;  Surgeon: Irving Copas., MD;  Location: [place];  Service: Gastroenterology;  Laterality: N/A;   EYE SURGERY Bilateral    cataract removal   FINE NEEDLE ASPIRATION  01/15/2021   Procedure: FINE NEEDLE ASPIRATION (FNA) LINEAR;  Surgeon: Irving Copas., MD;  Location: [facility] ENDOSCOPY;  Service: Gastroenterology;;   LAPAROSCOPY N/A 07/01/2021   Procedure: STAGING LAPAROSCOPY;  Surgeon: Dwan Bolt, MD;  Location: [place];  Service: General;  Laterality: N/A;   PORTACATH PLACEMENT Right 02/21/2021   Procedure: INSERTION PORT-A-CATH;  Surgeon: Dwan Bolt, MD;  Location: [place];  Service: General;  Laterality: Right;   SPLENECTOMY, 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   Procedure: SPLENECTOMY;  Surgeon: Dwan Bolt, MD;  Location: [place];  Service: General;  Laterality: N/A;   TONSILLECTOMY  1958   Patient Active Problem List   Diagnosis Date Noted   Foot callus 10/21/2021   Cellulitis 09/22/2021   Gastritis, 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   Malnutrition of moderate degree 07/02/2021   Pancreatic cancer ([place]) 07/01/2021   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  Coronary artery disease of bypass graft of native heart with stable angina pectoris ([place]) 06/04/2021   Genetic testing 04/04/2021   History of prostate cancer    Family history of breast cancer    Port-A-Cath in place 03/14/2021   Atrial fibrillation ([place])    Syncope and collapse 03/04/2021   Primary cancer of body of pancreas ([place]) 01/31/2021   Chest pain of uncertain etiology 40/07/6760   Elevated lipase 10/17/2020   H/O agent Orange exposure 10/17/2020   Dyslipidemia, goal LDL below 70 10/17/2020   Hx of CABG 02/28/2020   DM (diabetes mellitus), type 2 with renal complications ([place]) 95/08/3266   Hypertension with heart disease 04/01/2019   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[place])  04/01/2019   CKD (chronic kidney disease), stage III ([place]) 04/01/2019   Peripheral neuropathy 04/01/2019   Insomnia 11/30/2018     PCP: [name], [name] G, MD   REFERRING PROVIDER: [name], [name] G, MD   REFERRING DIAG: Frequent falls    THERAPY DIAG:  Frequent falls    ONSET DATE: Cancer diagnosis February   SUBJECTIVE:    SUBJECTIVE STATEMENT: Patient has no complaints today. He felt pretty good after the last visit. He still has not been able to do many exercises at home.    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 Yes, Number of falls: 3 major falls several small losses of balance The last time he fell he slid off the edge of the bed and couldn't hold himself; A few months ago he fell in the bathroom    LIVING ENVIRONMENT: Grab bars in the house; has steps in th house, but the patient dosen;'t have to go up them    OCCUPATION: retried   Works on computers   PLOF: Independent with household mobility with device   PATIENT GOALS    To improve strength and balance        TODAY'S TREATMENT: 12/12 Nu-step: 5 min L3  Supine LTR 2x15  Supine march 2x15 Supine band abduction 2x15 green  Supine ball squeeze 2x15   Heel raise: 2x15  Standing march 2x10    Narrow base eyes open 3x30  Tandem stance 2x20 sec each   Narrow base on air-ex 4x30 with seated rest break after first 2 . Significant improvement in balance from when he perfromed a few visits ago. Only CGA-> to min a but mostly CGA.     12/7 Nu-step 5 min UE/LE  LAQ 2x10 2lb  Seated March 2lb 2x10  Hip abduction 3x10 red Hip adduction 3x10 ball    Heel raise: 2x15  Standing march 2x10    Narrow base eyes open 3x30  Tandem stance 2x20 sec each         12/2   Nu-step 6 min  LAQ red 3x10 each leg  March 3x10 each leg red  Hamstring curl red 3x10 red  Bilateral er 3x10 red  Row 2x10 red  Shoulder extension 2x10 red    Balance: Narrow base eyes open 2x30 sec hold CGA Narrow base eyes closed 2x30 sec mod A    Tandem stance 2x20 sec hold mod a 1/2 tandem    Tried standing on air-ex but max a required  Eval:  Exercises Seated Knee Extension with Resistance - 1 x daily - 7 x weekly - 3 sets - 10 reps Seated Hip Abduction with Resistance - 1 x daily - 7 x weekly - 3 sets - 10 reps Seated March - 1 x daily - 7 x weekly - 3 sets - 10 reps     PATIENT EDUCATION:  Education details: reviewed benefits of balance training;  Person educated: Patient Education method: Explanation, Demonstration, Tactile cues, and Verbal cues Education comprehension: verbalized understanding, returned demonstration, verbal cues required, tactile cues required, and needs further education     HOME EXERCISE PROGRAM: Access Code: [email or web address] URL: [email or web address] Date: 10/25/2021 Prepared by: Carolyne Littles   Exercises Seated Knee Extension with Resistance - 1 x daily - 7 x weekly - 3 sets - 10 reps Seated Hip Abduction with Resistance - 1 x daily - 7 x weekly - 3 sets - 10 reps Seated March - 1 x daily - 7 x weekly - 3 sets - 10 reps     ASSESSMENT:   CLINICAL IMPRESSION: Patient required some assist to come from a supine position he may have more difficulty performing supine exercises then sitting and standing. Therapy was able to add the air-ex back into his treatment today he required significantly less assist with balance on the air-ex. He seems to be making progress. Therapy will perform a 6 min walk test with the patient next visit.    Objective impairments include Abnormal gait, decreased activity tolerance, decreased balance, decreased endurance, decreased mobility, difficulty walking, decreased ROM, decreased  strength, impaired UE functional use, and pain. These impairments are limiting patient from cleaning, driving, meal prep, yard work, and shopping. Personal factors including Age, Fitness, and 1-2 comorbidities: significant arthritis in his shoulders and peripheral neuropathy  are also affecting patient's functional outcome. Patient will benefit from skilled PT to address above impairments and improve overall function.   REHAB POTENTIAL: Good   CLINICAL DECISION MAKING: Evolving/moderate complexity more falls and declining mobility    EVALUATION COMPLEXITY: Moderate     GOALS: Goals reviewed with patient? Yes   SHORT TERM GOALS:   STG Name Target Date Goal status  1 Patient will increase gross LE strength by 5 lbs  Baseline:  11/22/2021 INITIAL  2 Patient will require min a with tandem stance with eyes closed Baseline:  11/22/2021 INITIAL  3 Patient will ambulate 300' with improved posture and hip flexion  Baseline: 11/22/2021 INITIAL  LONG TERM GOALS:    LTG Name Target Date Goal status  1 BERG balance goal to be established  Baseline: 12/20/2021 INITIAL  2 Patient wil have no falls for a 2 week time period in order to improve safety  Baseline: 12/20/2021 INITIAL  3 Patient will report improved ability to perfrom ADLs without assistance  Baseline: 12/20/2021 INITIAL  PLAN: PT FREQUENCY: 2x/week   PT DURATION: 8 weeks   PLANNED INTERVENTIONS: Therapeutic exercises, Therapeutic activity, Neuro Muscular re-education, Balance training, Gait training, Patient/Family education, Joint mobilization, Stair training, DME instructions, Aquatic Therapy, Electrical stimulation, Cryotherapy, Moist heat, Taping, and Manual therapy   PLAN FOR NEXT SESSION: Patient may need lift to get into the pool. In the pool work on gait, balance, and hip strengthening. On land develop his HEP for home including seated UE and LE exercises; assess tolerance to exercises      Carney Living PT DPT  11/25/2021,  12:52 PM

## 2021-11-26 ENCOUNTER — Telehealth: Payer: Self-pay | Admitting: Cardiology

## 2021-11-26 ENCOUNTER — Inpatient Hospital Stay: Payer: Medicare Other

## 2021-11-26 ENCOUNTER — Inpatient Hospital Stay: Payer: Medicare Other | Attending: Nurse Practitioner | Admitting: Nurse Practitioner

## 2021-11-26 ENCOUNTER — Encounter: Payer: Self-pay | Admitting: Nurse Practitioner

## 2021-11-26 ENCOUNTER — Telehealth: Payer: Self-pay | Admitting: Family Medicine

## 2021-11-26 ENCOUNTER — Other Ambulatory Visit: Payer: Self-pay | Admitting: Nurse Practitioner

## 2021-11-26 VITALS — BP 172/96 | HR 82 | Temp 98.1°F | Resp 18 | Ht 71.0 in | Wt 249.0 lb

## 2021-11-26 DIAGNOSIS — D649 Anemia, unspecified: Secondary | ICD-10-CM | POA: Insufficient documentation

## 2021-11-26 DIAGNOSIS — I10 Essential (primary) hypertension: Secondary | ICD-10-CM | POA: Insufficient documentation

## 2021-11-26 DIAGNOSIS — C251 Malignant neoplasm of body of pancreas: Secondary | ICD-10-CM | POA: Diagnosis not present

## 2021-11-26 DIAGNOSIS — I4891 Unspecified atrial fibrillation: Secondary | ICD-10-CM | POA: Insufficient documentation

## 2021-11-26 DIAGNOSIS — K2960 Other gastritis without bleeding: Secondary | ICD-10-CM

## 2021-11-26 DIAGNOSIS — E1140 Type 2 diabetes mellitus with diabetic neuropathy, unspecified: Secondary | ICD-10-CM | POA: Diagnosis not present

## 2021-11-26 DIAGNOSIS — Z95828 Presence of other vascular implants and grafts: Secondary | ICD-10-CM

## 2021-11-26 DIAGNOSIS — Z7901 Long term (current) use of anticoagulants: Secondary | ICD-10-CM | POA: Diagnosis not present

## 2021-11-26 LAB — CBC WITH DIFFERENTIAL (CANCER CENTER ONLY)
Abs Immature Granulocytes: 0.03 10*3/uL (ref 0.00–0.07)
Basophils Absolute: 0.1 10*3/uL (ref 0.0–0.1)
Basophils Relative: 1 %
Eosinophils Absolute: 0.2 10*3/uL (ref 0.0–0.5)
Eosinophils Relative: 3 %
HCT: 30.8 % — ABNORMAL LOW (ref 39.0–52.0)
Hemoglobin: 9.7 g/dL — ABNORMAL LOW (ref 13.0–17.0)
Immature Granulocytes: 0 %
Lymphocytes Relative: 15 %
Lymphs Abs: 1.3 10*3/uL (ref 0.7–4.0)
MCH: 30 pg (ref 26.0–34.0)
MCHC: 31.5 g/dL (ref 30.0–36.0)
MCV: 95.4 fL (ref 80.0–100.0)
Monocytes Absolute: 1.1 10*3/uL — ABNORMAL HIGH (ref 0.1–1.0)
Monocytes Relative: 13 %
Neutro Abs: 6.1 10*3/uL (ref 1.7–7.7)
Neutrophils Relative %: 68 %
Platelet Count: 409 10*3/uL — ABNORMAL HIGH (ref 150–400)
RBC: 3.23 MIL/uL — ABNORMAL LOW (ref 4.22–5.81)
RDW: 25.7 % — ABNORMAL HIGH (ref 11.5–15.5)
WBC Count: 8.8 10*3/uL (ref 4.0–10.5)
nRBC: 0 % (ref 0.0–0.2)

## 2021-11-26 MED ORDER — HEPARIN SOD (PORK) LOCK FLUSH 100 UNIT/ML IV SOLN
500.0000 [IU] | Freq: Once | INTRAVENOUS | Status: AC
  Administered 2021-11-26: 500 [IU]

## 2021-11-26 MED ORDER — SODIUM CHLORIDE 0.9% FLUSH
10.0000 mL | Freq: Once | INTRAVENOUS | Status: AC
  Administered 2021-11-26: 10 mL

## 2021-11-26 NOTE — Telephone Encounter (Signed)
Pt c/o BP issue: STAT if pt c/o blurred vision, one-sided weakness or slurred speech  1. What are your last 5 BP readings?   12/09: 164/72 12/10: 170/84 12/12: 164/80 12/13: 162/77            174/??            183/??  2. Are you having any other symptoms (ex. Dizziness, headache, blurred vision, passed out)?  Patient's wife states his sugar was low this morning and he has been very sluggish and lethargic.  3. What is your BP issue?   BP has been elevated.

## 2021-11-26 NOTE — Telephone Encounter (Signed)
Left message for patient to call back and schedule Medicare Annual Wellness Visit (AWV) either virtually or in office. Left  my [name] number [phone]   Last AWV 10/10/20  please schedule at anytime with [facility] Nurse Health Advisor 1 or 2   This should be a 45 minute visit.

## 2021-11-26 NOTE — Progress Notes (Signed)
°  Bill OFFICE PROGRESS NOTE   Diagnosis: Pancreas cancer  INTERVAL HISTORY:   [name] returns as scheduled.  He completed the final cycle of chemotherapy 10/29/2021.  Abraxane was held due to neuropathy.  He continues to have numbness in the hands and feet.  The foot numbness predated chemotherapy.  He denies nausea/vomiting.  No constipation or diarrhea.  He describes his appetite as "so-so".  He notes blood pressure has been higher over the past few weeks.  He reports mild orthopnea for the past few weeks.  Objective:  Vital signs in last 24 hours:  Blood pressure (!) 172/96, pulse 82, temperature 98.1 F (36.7 C), temperature source Oral, resp. rate 18, height 5\' 11"  (1.803 m), weight 249 lb (112.9 kg), SpO2 100 %.    Lymphatics: No palpable cervical, supraclavicular or axillary lymph nodes. Resp: Lungs clear bilaterally. Cardio: Regular rate and rhythm. GI: Abdomen soft and nontender.  No hepatosplenomegaly. Vascular: Pitting edema lower leg bilaterally. Port-A-Cath without erythema.  Lab Results:  Lab Results  Component Value Date   WBC 14.1 (H) 10/29/2021   HGB 9.1 (L) 10/29/2021   HCT 28.9 (L) 10/29/2021   MCV 89.5 10/29/2021   PLT 235 10/29/2021   NEUTROABS 9.6 (H) 10/29/2021    Imaging:  No results found.  Medications: I have reviewed the patient's current medications.  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  No pancreatic ductal dilatation identified.  No definite signs of metastatic disease in the chest, abdomen or pelvis. Cycle 1 gemcitabine/Abraxane 03/01/2021 Cycle 2 gemcitabine/Abraxane 03/14/2021 Cycle 3 gemcitabine/Abraxane 03/28/2021 Cycle 4 gemcitabine/Abraxane 04/17/2021 Cycle 5 gemcitabine/Abraxane 05/01/2021, Zofran/Decadron added Cycle 6 gemcitabine/Abraxane 05/15/2021 CT pancreas protocol 05/27/2021-unchanged 8 mm exophytic low-attenuation lesion at the posterior body of the pancreas 07/01/2021-distal pancreatectomy/splenectomy, 0.8 cm poorly differentiated adenocarcinoma, treatment response score-2, largest single foci of remaining tumor 0.2 cm, negative resection margins, 0/6 lymph nodes,ypT1bypNo, PanIN-1b Cycle 7 gemcitabine/Abraxane 08/14/2021 Cycle 8 gemcitabine/Abraxane 09/04/2021 Cycle 9 gemcitabine/Abraxane 09/18/2021 Cycle 10 gemcitabine/Abraxane 10/01/2021 Cycle 11 gemcitabine/Abraxane 10/16/2021 Cycle 12 gemcitabine/Abraxane 10/29/2021, Abraxane held secondary to neuropathy Diabetes Coronary artery disease Prostate cancer 2013-treated with radiation at [facility] Hypertension Sleep apnea 7.  Coronary artery bypass surgery 2004 8.  Hospital admission 03/04/2021-syncope, A. Fib-started on Eliquis 9.  Mild thrombocytopenia following cycle 1 gemcitabine/Abraxane-resolved 10.  Anemia 11.  Admission 09/22/2021 with a right metatarsal ulcer and right leg cellulitis      Disposition: [name] completed the course of adjuvant chemotherapy last month.  He has persistent neuropathy symptoms.  Hopefully symptoms will improve over the next few months.  We reviewed the CBC from today.  Hemoglobin is better.  Over the past few weeks he has noted blood pressure is more elevated and he has mild orthopnea.  He will contact Dr. [name].  He will return for lab, Port-A-Cath flush and follow-up in 6 weeks.    Ned Card  ANP/GNP-BC   11/26/2021  10:34 AM

## 2021-11-26 NOTE — Telephone Encounter (Signed)
Called patient left message to call me back.

## 2021-11-26 NOTE — Telephone Encounter (Signed)
Would consider adding amlodipine 2.5 mg daily. Continue losartan. Would avoid diuretic given history of orthostatic hypotension. Would arrange follow up with APP to address symptoms.  [name] [name] MD, [facility]

## 2021-11-26 NOTE — Patient Instructions (Signed)

## 2021-11-26 NOTE — Telephone Encounter (Signed)
Spoke with patient of Dr. [name]  He reports elevated BP over the last few days Losartan was recently increased to 100mg  daily (10/11/21) -- did improve to 130s-140s up until about 1 week ago He said when he lies down, he has trouble breathing and has to work on catching his breath -- this is new  He said when he lies down, it feels like there is a board across his chest, doesn't persist No chest pains during the day  He reports shortness of breath with some exertion, when showering -- this is a change from baseline  Offered appt with MD today at 4:20pm but he declined as he has prior commitments  Advised will send to MD to review

## 2021-11-27 ENCOUNTER — Encounter (HOSPITAL_BASED_OUTPATIENT_CLINIC_OR_DEPARTMENT_OTHER): Payer: Self-pay | Admitting: Physical Therapy

## 2021-11-27 ENCOUNTER — Other Ambulatory Visit: Payer: Self-pay

## 2021-11-27 ENCOUNTER — Ambulatory Visit (HOSPITAL_BASED_OUTPATIENT_CLINIC_OR_DEPARTMENT_OTHER): Payer: Medicare Other | Admitting: Physical Therapy

## 2021-11-27 DIAGNOSIS — R296 Repeated falls: Secondary | ICD-10-CM | POA: Diagnosis not present

## 2021-11-27 DIAGNOSIS — R2689 Other abnormalities of gait and mobility: Secondary | ICD-10-CM

## 2021-11-27 DIAGNOSIS — M6281 Muscle weakness (generalized): Secondary | ICD-10-CM | POA: Diagnosis not present

## 2021-11-27 MED ORDER — AMLODIPINE BESYLATE 2.5 MG PO TABS: 2.5000 mg | ORAL_TABLET | Freq: Every day | ORAL | 3 refills | Status: DC

## 2021-11-27 NOTE — Therapy (Signed)
OUTPATIENT PHYSICAL THERAPY TREATMENT NOTE   Patient Name: [name] MRN: [id number] DOB:[birth year], 75 y.o., male Today's Date: 11/27/2021  PCP: [name], [name] G, MD REFERRING PROVIDER: [name], [name] G, MD   PT End of Session - 11/27/21 1152     Visit Number 7    Number of Visits 16    Date for PT Re-Evaluation 12/20/21    PT Start Time 9211    PT Stop Time 1228    PT Time Calculation (min) 43 min    Activity Tolerance Patient tolerated treatment well    Behavior During Therapy [facility] for tasks assessed/performed             Past Medical History:  Diagnosis Date   A-fib ([place]) 03/04/2021   Anemia    Arthritis    Cancer ([place])    prostate   Chronic kidney disease    blood in urine    Constipation    Coronary artery disease    Depression    Diabetes mellitus without complication ([place])    Difficult intubation    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 it was hard to get the tube down his throat   Dyspnea    Family history of breast cancer    Fatty liver    Fatty liver    Frequent headaches    GERD (gastroesophageal reflux disease)    History of chicken pox    History of fainting spells of unknown cause    History of prostate cancer    Hyperlipidemia    Hypertension    Myocardial infarction [facility])    Peripheral neuropathy    Pneumonia    Prostate cancer ([place])    PTSD (post-traumatic stress disorder)    Sleep apnea    uses Cpap   Past Surgical History:  Procedure Laterality Date   APPENDECTOMY     BIOPSY  01/15/2021   Procedure: BIOPSY;  Surgeon: Irving Copas., MD;  Location: [place];  Service: Gastroenterology;;   CARDIAC CATHETERIZATION  06/26/2017   CARDIAC SURGERY     Triple Bypass   CHOLECYSTECTOMY  2010   COLONOSCOPY     CORONARY ARTERY BYPASS GRAFT  2004   ESOPHAGOGASTRODUODENOSCOPY (EGD) WITH PROPOFOL N/A 01/15/2021   Procedure: ESOPHAGOGASTRODUODENOSCOPY (EGD) WITH PROPOFOL;  Surgeon: Irving Copas., MD;  Location: [facility]  ENDOSCOPY;  Service: Gastroenterology;  Laterality: N/A;   EUS N/A 01/15/2021   Procedure: UPPER ENDOSCOPIC ULTRASOUND (EUS) RADIAL;  Surgeon: Irving Copas., MD;  Location: [place];  Service: Gastroenterology;  Laterality: N/A;   EYE SURGERY Bilateral    cataract removal   FINE NEEDLE ASPIRATION  01/15/2021   Procedure: FINE NEEDLE ASPIRATION (FNA) LINEAR;  Surgeon: Irving Copas., MD;  Location: [facility] ENDOSCOPY;  Service: Gastroenterology;;   LAPAROSCOPY N/A 07/01/2021   Procedure: STAGING LAPAROSCOPY;  Surgeon: Dwan Bolt, MD;  Location: [place];  Service: General;  Laterality: N/A;   PORTACATH PLACEMENT Right 02/21/2021   Procedure: INSERTION PORT-A-CATH;  Surgeon: Dwan Bolt, MD;  Location: [place];  Service: General;  Laterality: Right;   SPLENECTOMY, 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   Procedure: SPLENECTOMY;  Surgeon: Dwan Bolt, MD;  Location: [place];  Service: General;  Laterality: N/A;   TONSILLECTOMY  1958   Patient Active Problem List   Diagnosis Date Noted   Foot callus 10/21/2021   Cellulitis 09/22/2021   Gastritis, 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   Malnutrition of moderate degree 07/02/2021   Pancreatic cancer ([place]) 07/01/2021   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  Coronary artery disease of bypass graft of native heart with stable angina pectoris ([place]) 06/04/2021   Genetic testing 04/04/2021   History of prostate cancer    Family history of breast cancer    Port-A-Cath in place 03/14/2021   Atrial fibrillation ([place])    Syncope and collapse 03/04/2021   Primary cancer of body of pancreas ([place]) 01/31/2021   Chest pain of uncertain etiology 71/69/6789   Elevated lipase 10/17/2020   H/O agent Orange exposure 10/17/2020   Dyslipidemia, goal LDL below 70 10/17/2020   Hx of CABG 02/28/2020   DM (diabetes mellitus), type 2 with renal complications ([place]) 38/09/1750   Hypertension with heart disease 04/01/2019   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[place])  04/01/2019   CKD (chronic kidney disease), stage III ([place]) 04/01/2019   Peripheral neuropathy 04/01/2019   Insomnia 11/30/2018     PCP: [name], [name] G, MD   REFERRING PROVIDER: [name], [name] G, MD   REFERRING DIAG: Frequent falls    THERAPY DIAG:  Frequent falls    ONSET DATE: Cancer diagnosis February   SUBJECTIVE:    SUBJECTIVE STATEMENT: Patient has no complaints today. He felt pretty good after the last visit. He still has not been able to do many exercises at home.    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 Yes, Number of falls: 3 major falls several small losses of balance The last time he fell he slid off the edge of the bed and couldn't hold himself; A few months ago he fell in the bathroom    LIVING ENVIRONMENT: Grab bars in the house; has steps in th house, but the patient dosen;'t have to go up them    OCCUPATION: retried   Works on computers   PLOF: Independent with household mobility with device   PATIENT GOALS    To improve strength and balance        TODAY'S TREATMENT: 12/15 Nu-step: 5 min L3  Cable Shoulder extension 5 lbs with guarding 310  Shoulder extension 5 lbs with guarding 3x10  Leg press 3x10 50 lbs   Narrow base eyes open min a 3x30  Narrow base eyes clsed mod to max assist eyes open   Heel raise to rock back 3x10 with min a for posterior balance   12/12 Nu-step: 5 min L3  Supine LTR 2x15  Supine march 2x15 Supine band abduction 2x15 green  Supine ball squeeze 2x15    Heel raise: 2x15  Standing march 2x10    Narrow base eyes open 3x30  Tandem stance 2x20 sec each    Narrow base on air-ex 4x30 with seated rest break after first 2 . Significant improvement in balance from  when he perfromed a few visits ago. Only CGA-> to min a but mostly CGA.      12/7 Nu-step 5 min UE/LE  LAQ 2x10 2lb  Seated March 2lb 2x10  Hip abduction 3x10 red Hip adduction 3x10 ball    Heel raise: 2x15  Standing march 2x10    Narrow base eyes open 3x30  Tandem stance 2x20 sec each        12/2   Nu-step 6 min  LAQ red 3x10 each leg  March 3x10 each leg red  Hamstring curl red 3x10 red  Bilateral er 3x10 red  Row 2x10 red  Shoulder extension 2x10 red    Balance: Narrow base eyes open 2x30 sec hold CGA Narrow base eyes closed 2x30 sec mod A    Tandem stance 2x20 sec hold mod a 1/2 tandem    Tried standing on air-ex but max a required          Eval:  Exercises Seated Knee Extension with Resistance - 1 x daily - 7 x weekly - 3 sets - 10 reps Seated Hip Abduction with Resistance - 1 x daily - 7 x weekly - 3 sets - 10 reps Seated March - 1 x daily - 7 x weekly - 3 sets - 10 reps     PATIENT EDUCATION:  Education details: reviewed benefits of balance training;  Person educated: Patient Education method: Explanation, Demonstration, Tactile cues, and Verbal cues Education comprehension: verbalized understanding, returned demonstration, verbal cues required, tactile cues required, and needs further education     HOME EXERCISE PROGRAM: Access Code: [email or web address] URL: [email or web address] Date: 10/25/2021 Prepared by: Carolyne Littles   Exercises Seated Knee Extension with Resistance - 1 x daily - 7 x weekly - 3 sets - 10 reps Seated Hip Abduction with Resistance - 1 x daily - 7 x weekly - 3 sets - 10 reps Seated March - 1 x daily - 7 x weekly - 3 sets - 10 reps     ASSESSMENT:   CLINICAL IMPRESSION: Patient trailed gym equipment today. He has had some difficulty with motivation with home exercises. He feels like coming to the gym may help. We assessed his tolerance and ability to use gym equipment safely. He required some guarding with standing  exercises. He was able to complete. He reported minor fatigue with gym exercise but nothing significant. His balance was decreased though after perfroming gym exercises. He was advised to be aware of that fact.    Objective impairments include Abnormal gait, decreased activity tolerance, decreased balance, decreased endurance, decreased mobility, difficulty walking, decreased ROM, decreased strength, impaired UE functional use, and pain. These impairments are limiting patient from cleaning, driving, meal prep, yard work, and shopping. Personal factors including Age, Fitness, and 1-2 comorbidities: significant arthritis in his shoulders and peripheral neuropathy  are also affecting patient's functional outcome. Patient will benefit from skilled PT to address above impairments and improve overall function.   REHAB POTENTIAL: Good   CLINICAL DECISION MAKING: Evolving/moderate complexity more falls and declining mobility    EVALUATION COMPLEXITY: Moderate     GOALS: Goals reviewed with patient? Yes   SHORT TERM GOALS:   STG Name Target Date Goal status  1 Patient will increase gross LE strength by 5 lbs  Baseline:  11/22/2021 INITIAL  2 Patient will require min a with tandem stance with eyes closed Baseline:  11/22/2021 INITIAL  3 Patient will ambulate 300' with improved posture and hip flexion  Baseline: 11/22/2021 INITIAL  LONG TERM GOALS:    LTG Name Target Date Goal status  1 BERG balance goal to be established  Baseline: 12/20/2021 INITIAL  2 Patient wil have no falls for a 2 week time period in order to improve safety  Baseline: 12/20/2021 INITIAL  3 Patient will report improved ability to perfrom ADLs without assistance  Baseline: 12/20/2021 INITIAL  PLAN: PT FREQUENCY: 2x/week   PT DURATION: 8 weeks   PLANNED INTERVENTIONS: Therapeutic exercises, Therapeutic activity, Neuro Muscular re-education, Balance training, Gait training, Patient/Family education, Joint mobilization, Stair  training, DME instructions, Aquatic Therapy, Electrical stimulation, Cryotherapy, Moist heat, Taping, and Manual therapy   PLAN FOR NEXT SESSION: Patient may need lift to get into the pool. In the pool work on gait, balance, and hip strengthening. On land develop his HEP for home including seated UE and LE exercises; assess tolerance to exercises       Carney Living PT DPT  11/27/2021, 12:13 PM

## 2021-11-27 NOTE — Telephone Encounter (Signed)
Spoke to patient Dr.Jordan's advice given.He will start Amlodipine 2.5 mg daily.He will continue Losartan.Appointment scheduled with Laurann Montana NP 12/19/21 at 10:55 am at [facility] location.Advised to monitor B/P and bring readings to appointment.

## 2021-11-28 ENCOUNTER — Encounter (HOSPITAL_BASED_OUTPATIENT_CLINIC_OR_DEPARTMENT_OTHER): Payer: Self-pay | Admitting: Physical Therapy

## 2021-12-02 ENCOUNTER — Encounter (HOSPITAL_BASED_OUTPATIENT_CLINIC_OR_DEPARTMENT_OTHER): Payer: Self-pay | Admitting: Physical Therapy

## 2021-12-02 ENCOUNTER — Ambulatory Visit (HOSPITAL_BASED_OUTPATIENT_CLINIC_OR_DEPARTMENT_OTHER): Payer: Medicare Other | Admitting: Physical Therapy

## 2021-12-02 ENCOUNTER — Other Ambulatory Visit: Payer: Self-pay

## 2021-12-02 DIAGNOSIS — R296 Repeated falls: Secondary | ICD-10-CM

## 2021-12-02 DIAGNOSIS — M6281 Muscle weakness (generalized): Secondary | ICD-10-CM

## 2021-12-02 DIAGNOSIS — R2689 Other abnormalities of gait and mobility: Secondary | ICD-10-CM

## 2021-12-02 NOTE — Therapy (Signed)
OUTPATIENT PHYSICAL THERAPY TREATMENT NOTE   Patient Name: [name] MRN: [id number] DOB:03/01/1946, 75 y.o., male Today's Date: 12/02/2021  PCP: [name], [name] G, MD REFERRING PROVIDER: [name], [name] G, MD   PT End of Session - 12/02/21 1152     Visit Number 8    Number of Visits 16    Date for PT Re-Evaluation 12/20/21    Authorization Type [employer]: progress note next visit    PT Start Time 1148    PT Stop Time 15    PT Time Calculation (min) 42 min    Activity Tolerance Patient tolerated treatment well    Behavior During Therapy [facility] for tasks assessed/performed             Past Medical History:  Diagnosis Date   A-fib ([place]) 03/04/2021   Anemia    Arthritis    Cancer ([place])    prostate   Chronic kidney disease    blood in urine    Constipation    Coronary artery disease    Depression    Diabetes mellitus without complication ([place])    Difficult intubation    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 it was hard to get the tube down his throat   Dyspnea    Family history of breast cancer    Fatty liver    Fatty liver    Frequent headaches    GERD (gastroesophageal reflux disease)    History of chicken pox    History of fainting spells of unknown cause    History of prostate cancer    Hyperlipidemia    Hypertension    Myocardial infarction [facility])    Peripheral neuropathy    Pneumonia    Prostate cancer ([place])    PTSD (post-traumatic stress disorder)    Sleep apnea    uses Cpap   Past Surgical History:  Procedure Laterality Date   APPENDECTOMY     BIOPSY  01/15/2021   Procedure: BIOPSY;  Surgeon: Irving Copas., MD;  Location: [place];  Service: Gastroenterology;;   CARDIAC CATHETERIZATION  06/26/2017   CARDIAC SURGERY     Triple Bypass   CHOLECYSTECTOMY  2010   COLONOSCOPY     CORONARY ARTERY BYPASS GRAFT  2004   ESOPHAGOGASTRODUODENOSCOPY (EGD) WITH PROPOFOL N/A 01/15/2021   Procedure: ESOPHAGOGASTRODUODENOSCOPY (EGD) WITH PROPOFOL;   Surgeon: Irving Copas., MD;  Location: [facility] ENDOSCOPY;  Service: Gastroenterology;  Laterality: N/A;   EUS N/A 01/15/2021   Procedure: UPPER ENDOSCOPIC ULTRASOUND (EUS) RADIAL;  Surgeon: Irving Copas., MD;  Location: [place];  Service: Gastroenterology;  Laterality: N/A;   EYE SURGERY Bilateral    cataract removal   FINE NEEDLE ASPIRATION  01/15/2021   Procedure: FINE NEEDLE ASPIRATION (FNA) LINEAR;  Surgeon: Irving Copas., MD;  Location[facility] ENDOSCOPY;  Service: Gastroenterology;;   LAPAROSCOPY N/A 07/01/2021   Procedure: STAGING LAPAROSCOPY;  Surgeon: Dwan Bolt, MD;  Location: [place];  Service: General;  Laterality: N/A;   PORTACATH PLACEMENT Right 02/21/2021   Procedure: INSERTION PORT-A-CATH;  Surgeon: Dwan Bolt, MD;  Location: [place];  Service: General;  Laterality: Right;   SPLENECTOMY, 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   Procedure: SPLENECTOMY;  Surgeon: Dwan Bolt, MD;  Location: [place];  Service: General;  Laterality: N/A;   TONSILLECTOMY  1958   Patient Active Problem List   Diagnosis Date Noted   Foot callus 10/21/2021   Cellulitis 09/22/2021   Gastritis, 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   Malnutrition of moderate degree 07/02/2021  Pancreatic cancer ([place]) 07/01/2021   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   Coronary artery disease of bypass graft of native heart with stable angina pectoris ([place]) 06/04/2021   Genetic testing 04/04/2021   History of prostate cancer    Family history of breast cancer    Port-A-Cath in place 03/14/2021   Atrial fibrillation ([place])    Syncope and collapse 03/04/2021   Primary cancer of body of pancreas ([place]) 01/31/2021   Chest pain of uncertain etiology 88/41/6606   Elevated lipase 10/17/2020   H/O agent Orange exposure 10/17/2020   Dyslipidemia, goal LDL below 70 10/17/2020   Hx of CABG 02/28/2020   DM (diabetes mellitus), type 2 with renal complications ([place]) 30/16/0109   Hypertension with heart disease 04/01/2019    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[place]) 04/01/2019   CKD (chronic kidney disease), stage III ([place]) 04/01/2019   Peripheral neuropathy 04/01/2019   Insomnia 11/30/2018    PCP: [name], [name] G, MD   REFERRING PROVIDER: [name], [name] G, MD   REFERRING DIAG: Frequent falls    THERAPY DIAG:  Frequent falls    ONSET DATE: Cancer diagnosis February   SUBJECTIVE:    SUBJECTIVE STATEMENT: Patient reports he tolerated the gym exercises well. He had no pain.    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 Yes, Number of falls: 3 major falls several small losses of balance The last time he fell he slid off the edge of the bed and couldn't hold himself; A few months ago he fell in the bathroom    LIVING ENVIRONMENT: Grab bars in the house; has steps in th house, but the patient dosen;'t have to go up them    OCCUPATION: retried   Works on computers   PLOF: Independent with household mobility with device   PATIENT GOALS    To improve strength and balance     Objective L 40 lbs  R 30 lbs grip   Baseline  30 left  20 right     TODAY'S TREATMENT: 12/18 Nu-step L4 5 min  Bilateral er 3x10 red  Row 2x10 red  Shoulder extension 2x10 red   Knee extension 3x10 15lbs  Hip abduction 3x10 55 lbs   Narrow base: eyes open CGA 2x30 sec hold  Eyes closed 3x30 sec hold min to mod A first 2 trials: last max a for balance       12/15 Nu-step: 5 min L3  Cable Shoulder extension 5 lbs with guarding 310  Shoulder extension 5 lbs with guarding 3x10  Leg press 3x10 50 lbs    Narrow base eyes open min a 3x30  Narrow base eyes clsed mod to max assist eyes open    Heel raise to rock back 3x10 with min a for posterior balance     12/12 Nu-step: 5 min L3  Supine LTR 2x15  Supine march 2x15 Supine band abduction 2x15 green  Supine ball squeeze 2x15    Heel raise: 2x15  Standing march 2x10    Narrow base eyes open 3x30  Tandem stance 2x20 sec each    Narrow base on air-ex 4x30 with seated rest break after first 2 . Significant improvement in balance from when he perfromed a few visits ago. Only  CGA-> to min a but mostly CGA.      12/7 Nu-step 5 min UE/LE  LAQ 2x10 2lb  Seated March 2lb 2x10  Hip abduction 3x10 red Hip adduction 3x10 ball    Heel raise: 2x15  Standing march 2x10    Narrow base eyes open 3x30  Tandem stance 2x20 sec each        12/2   Nu-step 6 min  LAQ red 3x10 each leg  March 3x10 each leg red  Hamstring curl red 3x10 red  Bilateral er 3x10 red  Row 2x10 red  Shoulder extension 2x10 red    Balance: Narrow base eyes open 2x30 sec hold CGA Narrow base eyes closed 2x30 sec mod A    Tandem stance 2x20 sec hold mod a 1/2 tandem    Tried standing on air-ex but max a required          Eval:  Exercises Seated Knee Extension with Resistance - 1 x daily - 7 x weekly - 3 sets - 10 reps Seated Hip Abduction with Resistance - 1 x daily - 7 x weekly - 3 sets - 10 reps Seated March - 1 x daily - 7 x weekly - 3 sets - 10 reps     PATIENT EDUCATION:  Education details: reviewed benefits of balance training;  Person educated: Patient Education method: Explanation, Demonstration, Tactile cues, and Verbal cues Education comprehension: verbalized understanding, returned demonstration, verbal cues required, tactile cues required, and needs further education     HOME EXERCISE PROGRAM: Access Code: [email or web address] URL: [email or web address] Date: 10/25/2021 Prepared by: Carolyne Littles   Exercises Seated Knee Extension with Resistance - 1 x daily - 7 x weekly - 3 sets - 10 reps Seated Hip Abduction with Resistance - 1 x daily - 7 x weekly - 3 sets - 10 reps Seated March -  1 x daily - 7 x weekly - 3 sets - 10 reps     ASSESSMENT:   CLINICAL IMPRESSION: Patient tolerated treatment well. He reported mild fatigue and burning in his shoulders. He otherwise did well. His grip strength has improved. He required some assist towards the end of the visit with his balance work.    Objective impairments include Abnormal gait, decreased activity tolerance, decreased balance, decreased endurance, decreased mobility, difficulty walking, decreased ROM, decreased strength, impaired UE functional use, and pain. These impairments are limiting patient from cleaning, driving, meal prep, yard work, and shopping. Personal factors including Age, Fitness, and 1-2 comorbidities: significant arthritis in his shoulders and peripheral neuropathy  are also affecting patient's functional outcome. Patient will benefit from skilled PT to address above impairments and improve overall function.   REHAB POTENTIAL: Good   CLINICAL DECISION MAKING: Evolving/moderate complexity more falls and declining mobility    EVALUATION COMPLEXITY: Moderate     GOALS: Goals reviewed with patient? Yes   SHORT TERM GOALS:   STG Name Target Date Goal status  1 Patient will increase gross LE strength by 5 lbs  Baseline:  11/22/2021 INITIAL  2 Patient will require min a with tandem stance with eyes closed Baseline:  11/22/2021 INITIAL  3 Patient will ambulate 300' with improved posture and hip flexion  Baseline: 11/22/2021 INITIAL  LONG TERM GOALS:    LTG Name Target Date Goal status  1 BERG balance goal to be established  Baseline: 12/20/2021 INITIAL  2 Patient wil have no falls for a 2 week time period in order to improve safety  Baseline: 12/20/2021 INITIAL  3 Patient will report improved ability to perfrom ADLs without assistance  Baseline: 12/20/2021 INITIAL  PLAN: PT FREQUENCY: 2x/week   PT DURATION: 8 weeks   PLANNED INTERVENTIONS: Therapeutic exercises, Therapeutic activity, Neuro Muscular  re-education, Balance training, Gait training, Patient/Family education, Joint mobilization, Stair training, DME instructions, Aquatic Therapy, Electrical stimulation, Cryotherapy, Moist heat, Taping, and Manual therapy   PLAN FOR NEXT SESSION: Patient may need lift to get into the pool. In the pool work on gait, balance, and hip strengthening. On land develop his HEP for home including seated UE and LE exercises; assess tolerance to exercises        Carney Living PT DPT  12/02/2021, 12:30  AM

## 2021-12-04 ENCOUNTER — Ambulatory Visit (HOSPITAL_BASED_OUTPATIENT_CLINIC_OR_DEPARTMENT_OTHER): Payer: Medicare Other | Admitting: Physical Therapy

## 2021-12-04 ENCOUNTER — Other Ambulatory Visit: Payer: Self-pay

## 2021-12-04 DIAGNOSIS — M6281 Muscle weakness (generalized): Secondary | ICD-10-CM | POA: Diagnosis not present

## 2021-12-04 DIAGNOSIS — R2689 Other abnormalities of gait and mobility: Secondary | ICD-10-CM | POA: Diagnosis not present

## 2021-12-04 DIAGNOSIS — R296 Repeated falls: Secondary | ICD-10-CM

## 2021-12-04 NOTE — Therapy (Signed)
PCP: [name], [name] G, MD   REFERRING PROVIDER: [name], [name] G, MD   REFERRING DIAG: Frequent falls    THERAPY DIAG:  Frequent falls    ONSET DATE: Cancer diagnosis February   SUBJECTIVE:    SUBJECTIVE STATEMENT: Patient reports he fell on Monday. He felt like his legs just gave out. He felt his left leg catch under him but it didn't hurt it too bad. He still feels a little pain in his leg.    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 Yes, Number of falls: 3 major falls several small losses of balance The last time he fell he slid off the edge of the bed and couldn't hold himself; A few months ago he fell in the bathroom    LIVING ENVIRONMENT: Grab bars in the house; has steps in th house, but the patient dosen;'t have to go up them    OCCUPATION: retried   Works on computers   PLOF: Independent with household mobility with device   PATIENT GOALS    To improve strength and balance     Objective L 40 lbs  R 30 lbs grip    Baseline  30 left  20 right      TODAY'S TREATMENT: 12/21 Nu-step L3 5 min  Supine:  Supine march 3x10  Supine clamshell 3x15 green  Supine ball squeeze 3x15   Seated LAQ 3x10  Standing 2x10 heel raise  Standing slow march 2x10        12/18 Nu-step L4 5 min  Bilateral er 3x10 red  Row 2x10 red  Shoulder extension 2x10 red    Knee extension 3x10 15lbs  Hip abduction 3x10 55 lbs    Narrow base: eyes open CGA 2x30 sec hold  Eyes closed 3x30 sec hold min to mod A first 2 trials: last max a for balance          12/15 Nu-step: 5 min L3  Cable Shoulder extension 5 lbs with guarding 310  Shoulder extension 5 lbs with guarding 3x10  Leg press 3x10 50 lbs    Narrow base eyes  open min a 3x30  Narrow base eyes clsed mod to max assist eyes open    Heel raise to rock back 3x10 with min a for posterior balance    12/12 Nu-step: 5 min L3  Supine LTR 2x15  Supine march 2x15 Supine band abduction 2x15 green  Supine ball squeeze 2x15    Heel raise: 2x15  Standing march 2x10    Narrow base eyes open 3x30  Tandem stance 2x20 sec each    Narrow base on air-ex 4x30 with seated rest break after first 2 . Significant improvement in balance from when he perfromed a few visits ago. Only CGA-> to min a but mostly CGA.      12/7 Nu-step 5 min UE/LE  LAQ 2x10 2lb  Seated March 2lb 2x10  Hip abduction 3x10 red Hip adduction 3x10 ball    Heel raise: 2x15  Standing march 2x10    Narrow base eyes open 3x30  Tandem stance 2x20 sec each                PATIENT EDUCATION:  Education details: reviewed benefits of balance training;  Person educated: Patient Education method: Explanation, Demonstration, Tactile cues, and Verbal cues Education comprehension: verbalized understanding, returned demonstration, verbal cues required, tactile cues required, and needs further education     HOME EXERCISE PROGRAM: Access Code: [email or web address] URL: [email or web address] Date: 10/25/2021 Prepared by: Carolyne Littles   Exercises Seated Knee Extension with Resistance - 1 x daily - 7 x weekly - 3 sets - 10 reps Seated Hip Abduction with Resistance - 1 x daily - 7 x weekly - 3 sets - 10 reps Seated March - 1 x daily - 7 x weekly - 3 sets - 10 reps     ASSESSMENT:   CLINICAL IMPRESSION: Therapy kept the patients exercises basic because of his recent fall. He tolerated his basic exercises well. He reported minor soreness in his shoulders when he was lying down. He otherwise had no additional pain in his leg. We will continue to monitor his pain from his fall but at this time he seems like he has no residual issues.   Objective impairments include Abnormal gait,  decreased activity tolerance, decreased balance, decreased endurance, decreased mobility, difficulty walking, decreased ROM, decreased strength, impaired UE functional use, and pain. These impairments are limiting patient from cleaning, driving, meal prep, yard work, and shopping. Personal factors including Age, Fitness, and 1-2 comorbidities: significant arthritis in his shoulders and peripheral neuropathy  are also affecting patient's functional outcome. Patient will benefit from skilled PT to address above impairments and improve overall function.   REHAB POTENTIAL: Good   CLINICAL DECISION MAKING: Evolving/moderate complexity more falls and declining mobility    EVALUATION COMPLEXITY: Moderate     GOALS: Goals reviewed with patient? Yes   SHORT TERM GOALS:   STG Name Target Date Goal status  1 Patient will increase gross LE strength by 5 lbs  Baseline:  11/22/2021 INITIAL  2 Patient will require min a with tandem stance with eyes closed Baseline:  11/22/2021 INITIAL  3 Patient will ambulate 300' with improved posture and hip flexion  Baseline: 11/22/2021 INITIAL  LONG TERM GOALS:    LTG Name Target Date Goal status  1 BERG balance goal to be established  Baseline: 12/20/2021 INITIAL  2 Patient wil have no falls for a 2 week time period in order to improve safety  Baseline: 12/20/2021 INITIAL  3 Patient will report improved ability to perfrom ADLs without assistance  Baseline: 12/20/2021 INITIAL  PLAN: PT FREQUENCY: 2x/week   PT DURATION: 8 weeks   PLANNED INTERVENTIONS: Therapeutic exercises, Therapeutic activity, Neuro Muscular re-education, Balance training, Gait training, Patient/Family education, Joint mobilization, Stair training, DME instructions, Aquatic Therapy, Electrical stimulation, Cryotherapy, Moist heat, Taping, and Manual therapy   PLAN FOR NEXT SESSION: Patient may need lift to get into the pool. In the pool work on gait, balance, and hip strengthening. On land develop  his HEP for home including seated UE and LE exercises; assess tolerance to exercises

## 2021-12-05 ENCOUNTER — Encounter (HOSPITAL_BASED_OUTPATIENT_CLINIC_OR_DEPARTMENT_OTHER): Payer: Self-pay | Admitting: Physical Therapy

## 2021-12-06 ENCOUNTER — Ambulatory Visit (INDEPENDENT_AMBULATORY_CARE_PROVIDER_SITE_OTHER): Payer: Medicare Other

## 2021-12-06 VITALS — Ht 71.0 in | Wt 249.0 lb

## 2021-12-06 DIAGNOSIS — Z0000 Encounter for general adult medical examination without abnormal findings: Secondary | ICD-10-CM

## 2021-12-06 NOTE — Progress Notes (Addendum)
Subjective:   [name] is a 75 y.o. male who presents for Medicare Annual/Subsequent preventive examination.  Review of Systems    No ROS Cardiac Risk Factors include: advanced age (>7men, >71 women);hypertension;diabetes mellitus    Objective:    Today's Vitals   12/06/21 0951  Weight: 249 lb (112.9 kg)  Height: 5\' 11"  (1.803 m)   Body mass index is 34.73 kg/m.  Advanced Directives 12/06/2021 11/26/2021 10/29/2021 10/17/2021 10/16/2021 09/22/2021 09/18/2021  Does Patient Have a Medical Advance Directive? Yes Yes Yes No No No No  Type of Paramedic of [place];Living will Living will;Healthcare Power of [place];Out of facility DNR (pink MOST or yellow form) [place];Living will - - - -  Does patient want to make changes to medical advance directive? No - Patient declined No - Patient declined No - Patient declined No - Patient declined No - Patient declined No - Patient declined No - Patient declined  Copy of [place] in Chart? No - copy requested Yes - validated most recent copy scanned in chart (See row information) No - copy requested - - - -  Would patient like information on creating a medical advance directive? - No - Patient declined No - Patient declined - - No - Patient declined -    Current Medications (verified) Outpatient Encounter Medications as of 12/06/2021  Medication Sig   acetaminophen (TYLENOL) 325 MG tablet Take 2 tablets (650 mg total) by mouth every 6 (six) hours as needed for mild pain. (Patient taking differently: Take 650 mg by mouth every 6 (six) hours as needed for mild pain or fever.)   amLODipine (NORVASC) 2.5 MG tablet Take 1 tablet (2.5 mg total) by mouth daily.   apixaban (ELIQUIS) 5 MG TABS tablet Take 5 mg by mouth 2 (two) times daily.   atorvastatin (LIPITOR) 80 MG tablet Take 40 mg by mouth at bedtime.   calcium-vitamin D (OSCAL WITH D) 500-200 MG-UNIT tablet Take 1  tablet by mouth 2 (two) times daily.   clotrimazole-betamethasone (LOTRISONE) cream Apply 1 application topically daily as needed.   cycloSPORINE (RESTASIS) 0.05 % ophthalmic emulsion Place 1 drop into both eyes 2 (two) times daily as needed (dry eyes).   doxycycline (VIBRA-TABS) 100 MG tablet Take 1 tablet (100 mg total) by mouth 2 (two) times daily.   ferrous sulfate 325 (65 FE) MG tablet Take 1 tablet (325 mg total) by mouth daily with breakfast.   FLUoxetine (PROZAC) 20 MG capsule Take 40 mg by mouth daily.   glucose blood (ONETOUCH VERIO) test strip Use to test blood sugars 1-2 times daily.   insulin glargine (LANTUS) 100 UNIT/ML injection Inject 0.3 mLs (30 Units total) into the skin daily. (Patient taking differently: Inject 50 Units into the skin every morning.)   losartan (COZAAR) 100 MG tablet Take 1 tablet (100 mg total) by mouth daily.   Multiple Vitamin (MULTI-VITAMINS) TABS Take 1 tablet by mouth daily.   nystatin powder Apply 1 application topically 3 (three) times daily.   pantoprazole (PROTONIX) 40 MG tablet Take 1 tablet (40 mg total) by mouth daily.   potassium chloride SA (KLOR-CON) 20 MEQ tablet TAKE 1 TABLET BY MOUTH EVERY DAY   tamsulosin (FLOMAX) 0.4 MG CAPS capsule Take 0.8 mg by mouth at bedtime.   traZODone (DESYREL) 100 MG tablet Take 100 mg by mouth at bedtime.   vitamin B-12 (CYANOCOBALAMIN) 1000 MCG tablet Take 1,000 mcg by mouth daily.  No facility-administered encounter medications on file as of 12/06/2021.    Allergies (verified) Furosemide, Keflex [cephalexin], Lisinopril, and Terazosin   History: Past Medical History:  Diagnosis Date   A-fib ([place]) 03/04/2021   Anemia    Arthritis    Cancer ([facility])    prostate   Chronic kidney disease    blood in urine    Constipation    Coronary artery disease    Depression    Diabetes mellitus without complication ([place])    Difficult intubation    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 it was hard to get the tube down his throat    Dyspnea    Family history of breast cancer    Fatty liver    Fatty liver    Frequent headaches    GERD (gastroesophageal reflux disease)    History of chicken pox    History of fainting spells of unknown cause    History of prostate cancer    Hyperlipidemia    Hypertension    Myocardial infarction [facility])    Peripheral neuropathy    Pneumonia    Prostate cancer ([place])    PTSD (post-traumatic stress disorder)    Sleep apnea    uses Cpap   Past Surgical History:  Procedure Laterality Date   APPENDECTOMY     BIOPSY  01/15/2021   Procedure: BIOPSY;  Surgeon: Irving Copas., MD;  Location: [place];  Service: Gastroenterology;;   CARDIAC CATHETERIZATION  06/26/2017   CARDIAC SURGERY     Triple Bypass   CHOLECYSTECTOMY  2010   COLONOSCOPY     CORONARY ARTERY BYPASS GRAFT  2004   ESOPHAGOGASTRODUODENOSCOPY (EGD) WITH PROPOFOL N/A 01/15/2021   Procedure: ESOPHAGOGASTRODUODENOSCOPY (EGD) WITH PROPOFOL;  Surgeon: Irving Copas., MD;  [facility] ENDOSCOPY;  Service: Gastroenterology;  Laterality: N/A;   EUS N/A 01/15/2021   Procedure: UPPER ENDOSCOPIC ULTRASOUND (EUS) RADIAL;  Surgeon: Irving Copas., MD;  Location: [place];  Service: Gastroenterology;  Laterality: N/A;   EYE SURGERY Bilateral    cataract removal   FINE NEEDLE ASPIRATION  01/15/2021   Procedure: FINE NEEDLE ASPIRATION (FNA) LINEAR;  Surgeon: Irving Copas., MD;  Location: [place];  Service: Gastroenterology;;   LAPAROSCOPY N/A 07/01/2021   Procedure: STAGING LAPAROSCOPY;  Surgeon: Dwan Bolt, MD;  Location: [place];  Service: General;  Laterality: N/A;   PORTACATH PLACEMENT Right 02/21/2021   Procedure: INSERTION PORT-A-CATH;  Surgeon: Dwan Bolt, MD;  Location: [place];  Service: General;  Laterality: Right;   SPLENECTOMY, 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   Procedure: SPLENECTOMY;  Surgeon: Dwan Bolt, MD;  Location: [place];  Service: General;  Laterality: N/A;    TONSILLECTOMY  1958   Family History  Problem Relation Age of Onset   Arthritis Mother    Heart disease Mother    Hyperlipidemia Mother    Hypertension Mother    Heart attack Mother    Breast cancer Sister        dx 104s   Heart attack Brother    Heart disease Brother    Depression Daughter    Arthritis Sister    Cancer Sister        unknown type, dx 1s   Cancer Brother    Learning disabilities Brother    Alcohol abuse Brother    COPD Brother    Heart attack Brother    Heart disease Brother    Social History   Socioeconomic History   Marital status: Married  Spouse name: Not on file   Number of children: 2   Years of education: Not on file   Highest education level: Not on file  Occupational History   Occupation: retired  Tobacco Use   Smoking status: Former    Types: Cigarettes    Quit date: 12/14/1975    Years since quitting: 46.0   Smokeless tobacco: Never  Vaping Use   Vaping Use: Not on file  Substance and Sexual Activity   Alcohol use: Not Currently   Drug use: Never   Sexual activity: Not Currently  Other Topics Concern   Not on file  Social History Narrative   ** Merged History Encounter **       Social Determinants of Health   Financial Resource Strain: Low Risk    Difficulty of Paying Living Expenses: Not hard at all  Food Insecurity: No Food Insecurity   Worried About Charity fundraiser in the Last Year: Never true   [place] in the Last Year: Never true  Transportation Needs: No Transportation Needs   Lack of Transportation (Medical): No   Lack of Transportation (Non-Medical): No  Physical Activity: Insufficiently Active   Days of Exercise per Week: 2 days   Minutes of Exercise per Session: 60 min  Stress: No Stress Concern Present   Feeling of Stress : Not at all  Social Connections: Moderately Isolated   Frequency of Communication with Friends and Family: More than three times a week   Frequency of Social Gatherings with  Friends and Family: More than three times a week   Attends Religious Services: Never   Marine scientist or Organizations: No   Attends Archivist Meetings: Never   Marital Status: Married     Clinical Intake: Nutrition Risk Assessment:  Has the patient had any N/V/D within the last 2 months?  No  Does the patient have any non-healing wounds?  No  Has the patient had any unintentional weight loss or weight gain?  No   Diabetes:  Is the patient diabetic?  Yes  If diabetic, was a CBG obtained today?  Yes  Did the patient bring in their glucometer from home?  No Audio Visit How often do you monitor your CBG's?  Daily   Financial Strains and Diabetes Management:  Are you having any financial strains with the device, your supplies or your medication? No .  Does the patient want to be seen by Chronic Care Management for management of their diabetes?  No  Would the patient like to be referred to a Nutritionist or for Diabetic Management?  No  Followed by PCP  Diabetic Exams:  Diabetic Eye Exam: Completed Yes. Followed by [facility]A.  How often do you need to have someone help you when you read instructions, pamphlets, or other written materials from your doctor or pharmacy?: 1 - Never Activities of Daily Living In your present state of health, do you have any difficulty performing the following activities: 12/06/2021 09/22/2021  Hearing? N N  Vision? N N  Difficulty concentrating or making decisions? - N  Comment - -  Walking or climbing stairs? N Y  Comment - r/t to swelling to R foot  Dressing or bathing? N N  Comment Wife assist -  Doing errands, shopping? N Y  Conservation officer, nature and eating ? N -  Using the Toilet? N -  In the past six months, have you accidently leaked urine? N -  Do you have  problems with loss of bowel control? N -  Managing your Medications? N -  Managing your Finances? N -  Housekeeping or managing your Housekeeping? N -  Some recent data might  be hidden    Patient Care Team: [name], [name] G, MD as PCP - General (Family Medicine) [name], Peter M, MD as PCP - Cardiology (Cardiology) Ladell Pier, MD as Consulting Physician (Oncology)  Indicate any recent Medical Services you may have received from other than [facility] providers in the past year (date may be approximate).     Assessment:   This is a routine wellness examination for Domingo.  Virtual Visit via Telephone Note  I connected with  Doristine Mango on 12/06/21 at  9:45 AM EST by telephone and verified that I am speaking with the correct person using two identifiers.  Location: Patient: Home Provider: Office Persons participating in the virtual visit: patient/Nurse Health Advisor   I discussed the limitations, risks, security and privacy concerns of performing an evaluation and management service by telephone and the availability of in person appointments. The patient expressed understanding and agreed to proceed.  Interactive audio and video telecommunications were attempted between this nurse and patient, however failed, due to patient having technical difficulties OR patient did not have access to video capability.  We continued and completed visit with audio only.  Some vital signs may be absent or patient reported.   Criselda Peaches, LPN   Hearing/Vision screen Hearing Screening - Comments:: No difficulty hearing Vision Screening - Comments:: Wears glasses. Followed by [facility]A.  Dietary issues and exercise activities discussed: Current Exercise Habits: Home exercise routine, Type of exercise: walking, Time (Minutes): 60, Frequency (Times/Week): 2, Weekly Exercise (Minutes/Week): 120, Intensity: Moderate   Goals Addressed   None    Depression Screen PHQ 2/9 Scores 12/06/2021 06/04/2021 10/11/2020 02/28/2020 08/31/2019  PHQ - 2 Score 0 2 0 3 0  PHQ- 9 Score - 6 - 4 -    Fall Risk Fall Risk  12/06/2021 10/11/2020 08/31/2019  Falls in the past year? 1 0  1  Number falls in past yr: 0 0 0  Injury with Fall? 0 0 0  Risk for fall due to : - Impaired balance/gait;Orthopedic patient Impaired balance/gait;Orthopedic patient;Impaired mobility  Follow up - Education provided Education provided    FALL RISK PREVENTION PERTAINING TO THE HOME:  Any stairs in or around the home? Yes  If so, are there any without handrails? No  Home free of loose throw rugs in walkways, pet beds, electrical cords, etc? Yes  Adequate lighting in your home to reduce risk of falls? Yes   ASSISTIVE DEVICES UTILIZED TO PREVENT FALLS:  Life alert? No  Use of a cane, walker or w/c? Yes  Grab bars in the bathroom? Yes  Shower chair or bench in shower? Yes  Elevated toilet seat or a handicapped toilet? Yes   TIMED UP AND GO:  Was the test performed? No . Audio Visit   Cognitive Function:     Immunizations Immunization History  Administered Date(s) Administered   Fluad Quad(high Dose 65+) 08/31/2019, 10/10/2020, 09/26/2021   HiB (PRP-OMP) 02/08/2021   Influenza-Unspecified 10/17/1997, 09/14/2002, 11/24/2002, 09/29/2003, 10/15/2004, 10/15/2005, 09/28/2006, 11/09/2007, 10/06/2008, 10/17/2010, 10/16/2011, 10/15/2012, 09/13/2013, 09/14/2014, 10/09/2015, 10/29/2016, 10/08/2017, 07/15/2018, 08/31/2019   Meningococcal B, OMV 07/09/2021, 11/09/2021   Meningococcal Mcv4o 02/08/2021, 07/09/2021   PFIZER(Purple Top)SARS-COV-2 Vaccination 01/19/2020, 02/14/2020, 09/25/2020   Pfizer Covid-19 Vaccine Bivalent Booster 56yrs & up 09/24/2021   Pneumococcal Conjugate-13 11/09/2013  Pneumococcal Polysaccharide-23 02/13/2005, 08/12/2012, 09/21/2019   Tdap 09/18/2011, 06/09/2018, 09/22/2021   Zoster Recombinat (Shingrix) 06/09/2018, 09/01/2018   Zoster, Live 01/12/2012    Screening Tests Health Maintenance  Topic Date Due   Meningococcal B Vaccine (1 of 4 - Increased Risk Bexsero 2-dose series) 10/25/1956   FOOT EXAM  02/27/2021   OPHTHALMOLOGY EXAM  12/18/2021    HEMOGLOBIN A1C  03/24/2022   Pneumonia Vaccine 65+ Years old  Completed   INFLUENZA VACCINE  Completed   COVID-19 Vaccine  Completed   Zoster Vaccines- Shingrix  Completed   HPV VACCINES  Aged Out   COLONOSCOPY (Pts 45-22yrs Insurance coverage will need to be confirmed)  Discontinued   TETANUS/TDAP  Discontinued   Hepatitis C Screening  Discontinued    Health Maintenance  Health Maintenance Due  Topic Date Due   Meningococcal B Vaccine (1 of 4 - Increased Risk Bexsero 2-dose series) 10/25/1956   FOOT EXAM  02/27/2021    Additional Screening:   Vision Screening: Recommended annual ophthalmology exams for early detection of glaucoma and other disorders of the eye. Is the patient up to date with their annual eye exam?  Yes  Who is the provider or what is the name of the office in which the patient attends annual eye exams? Followed by [facility]    Dental Screening: Recommended annual dental exams for proper oral hygiene  Community Resource Referral / Chronic Care Management:  CRR required this visit?  No   CCM required this visit?  No      Plan:     I have personally reviewed and noted the following in the patients chart:   Medical and social history Use of alcohol, tobacco or illicit drugs  Current medications and supplements including opioid prescriptions. Patient is not currently taking opioid prescriptions. Functional ability and status Nutritional status Physical activity Advanced directives List of other physicians Hospitalizations, surgeries, and ER visits in previous 12 months Vitals Screenings to include cognitive, depression, and falls Referrals and appointments  In addition, I have reviewed and discussed with patient certain preventive protocols, quality metrics, and best practice recommendations. A written personalized care plan for preventive services as well as general preventive health recommendations were provided to patient.     Criselda Peaches,  LPN   [id number]    I have reviewed available documentation from this visit and I agree with recommendations given.  [name] G. [name], MD  [facility]. [place] office.

## 2021-12-06 NOTE — Patient Instructions (Addendum)
[name] , Thank you for taking time to come for your Medicare Wellness Visit. I appreciate your ongoing commitment to your health goals. Please review the following plan we discussed and let me know if I can assist you in the future.   These are the goals we discussed:  Goals   None     This is a list of the screening recommended for you and due dates:  Health Maintenance  Topic Date Due   Meningococcal B Vaccine (1 of 4 - Increased Risk Bexsero 2-dose series) 10/25/1956   Complete foot exam   02/27/2021   Eye exam for diabetics  12/18/2021   Hemoglobin A1C  03/24/2022   Pneumonia Vaccine  Completed   Flu Shot  Completed   COVID-19 Vaccine  Completed   Zoster (Shingles) Vaccine  Completed   HPV Vaccine  Aged Out   Colon Cancer Screening  Discontinued   Tetanus Vaccine  Discontinued   Hepatitis C Screening: USPSTF Recommendation to screen - Ages 28-79 yo.  Discontinued   Advanced directives: Yes  Conditions/risks identified: None  Next appointment: Follow up in one year for your annual wellness visit.  Preventive Care 39 Years and Older, Male Preventive care refers to lifestyle choices and visits with your health care provider that can promote health and wellness. What does preventive care include? A yearly physical exam. This is also called an annual well check. Dental exams once or twice a year. Routine eye exams. Ask your health care provider how often you should have your eyes checked. Personal lifestyle choices, including: Daily care of your teeth and gums. Regular physical activity. Eating a healthy diet. Avoiding tobacco and drug use. Limiting alcohol use. Practicing safe sex. Taking low doses of aspirin every day. Taking vitamin and mineral supplements as recommended by your health care provider. What happens during an annual well check? The services and screenings done by your health care provider during your annual well check will depend on your age,  overall health, lifestyle risk factors, and family history of disease. Counseling  Your health care provider may ask you questions about your: Alcohol use. Tobacco use. Drug use. Emotional well-being. Home and relationship well-being. Sexual activity. Eating habits. History of falls. Memory and ability to understand (cognition). Work and work Statistician. Screening  You may have the following tests or measurements: Height, weight, and BMI. Blood pressure. Lipid and cholesterol levels. These may be checked every 5 years, or more frequently if you are over 63 years old. Skin check. Lung cancer screening. You may have this screening every year starting at age 13 if you have a 30-pack-year history of smoking and currently smoke or have quit within the past 15 years. Fecal occult blood test (FOBT) of the stool. You may have this test every year starting at age 43. Flexible sigmoidoscopy or colonoscopy. You may have a sigmoidoscopy every 5 years or a colonoscopy every 10 years starting at age 51. Prostate cancer screening. Recommendations will vary depending on your family history and other risks. Hepatitis C blood test. Hepatitis B blood test. Sexually transmitted disease (STD) testing. Diabetes screening. This is done by checking your blood sugar (glucose) after you have not eaten for a while (fasting). You may have this done every 1-3 years. Abdominal aortic aneurysm (AAA) screening. You may need this if you are a current or former smoker. Osteoporosis. You may be screened starting at age 72 if you are at high risk. Talk with your health care provider about  your test results, treatment options, and if necessary, the need for more tests. Vaccines  Your health care provider may recommend certain vaccines, such as: Influenza vaccine. This is recommended every year. Tetanus, diphtheria, and acellular pertussis (Tdap, Td) vaccine. You may need a Td booster every 10 years. Zoster vaccine.  You may need this after age 63. Pneumococcal 13-valent conjugate (PCV13) vaccine. One dose is recommended after age 54. Pneumococcal polysaccharide (PPSV23) vaccine. One dose is recommended after age 43. Talk to your health care provider about which screenings and vaccines you need and how often you need them. This information is not intended to replace advice given to you by your health care provider. Make sure you discuss any questions you have with your health care provider. Document Released: 12/28/2015 Document Revised: 08/20/2016 Document Reviewed: 10/02/2015 Elsevier Interactive Patient Education  2017 [employer] Prevention in the Home Falls can cause injuries. They can happen to people of all ages. There are many things you can do to make your home safe and to help prevent falls. What can I do on the outside of my home? Regularly fix the edges of walkways and driveways and fix any cracks. Remove anything that might make you trip as you walk through a door, such as a raised step or threshold. Trim any bushes or trees on the path to your home. Use bright outdoor lighting. Clear any walking paths of anything that might make someone trip, such as rocks or tools. Regularly check to see if handrails are loose or broken. Make sure that both sides of any steps have handrails. Any raised decks and porches should have guardrails on the edges. Have any leaves, snow, or ice cleared regularly. Use sand or salt on walking paths during winter. Clean up any spills in your garage right away. This includes oil or grease spills. What can I do in the bathroom? Use night lights. Install grab bars by the toilet and in the tub and shower. Do not use towel bars as grab bars. Use non-skid mats or decals in the tub or shower. If you need to sit down in the shower, use a plastic, non-slip stool. Keep the floor dry. Clean up any water that spills on the floor as soon as it happens. Remove soap  buildup in the tub or shower regularly. Attach bath mats securely with double-sided non-slip rug tape. Do not have throw rugs and other things on the floor that can make you trip. What can I do in the bedroom? Use night lights. Make sure that you have a light by your bed that is easy to reach. Do not use any sheets or blankets that are too big for your bed. They should not hang down onto the floor. Have a firm chair that has side arms. You can use this for support while you get dressed. Do not have throw rugs and other things on the floor that can make you trip. What can I do in the kitchen? Clean up any spills right away. Avoid walking on wet floors. Keep items that you use a lot in easy-to-reach places. If you need to reach something above you, use a strong step stool that has a grab bar. Keep electrical cords out of the way. Do not use floor polish or wax that makes floors slippery. If you must use wax, use non-skid floor wax. Do not have throw rugs and other things on the floor that can make you trip. What can I do with  my stairs? Do not leave any items on the stairs. Make sure that there are handrails on both sides of the stairs and use them. Fix handrails that are broken or loose. Make sure that handrails are as long as the stairways. Check any carpeting to make sure that it is firmly attached to the stairs. Fix any carpet that is loose or worn. Avoid having throw rugs at the top or bottom of the stairs. If you do have throw rugs, attach them to the floor with carpet tape. Make sure that you have a light switch at the top of the stairs and the bottom of the stairs. If you do not have them, ask someone to add them for you. What else can I do to help prevent falls? Wear shoes that: Do not have high heels. Have rubber bottoms. Are comfortable and fit you well. Are closed at the toe. Do not wear sandals. If you use a stepladder: Make sure that it is fully opened. Do not climb a closed  stepladder. Make sure that both sides of the stepladder are locked into place. Ask someone to hold it for you, if possible. Clearly mark and make sure that you can see: Any grab bars or handrails. First and last steps. Where the edge of each step is. Use tools that help you move around (mobility aids) if they are needed. These include: Canes. Walkers. Scooters. Crutches. Turn on the lights when you go into a dark area. Replace any light bulbs as soon as they burn out. Set up your furniture so you have a clear path. Avoid moving your furniture around. If any of your floors are uneven, fix them. If there are any pets around you, be aware of where they are. Review your medicines with your doctor. Some medicines can make you feel dizzy. This can increase your chance of falling. Ask your doctor what other things that you can do to help prevent falls. This information is not intended to replace advice given to you by your health care provider. Make sure you discuss any questions you have with your health care provider. Document Released: 09/27/2009 Document Revised: 05/08/2016 Document Reviewed: 01/05/2015 Elsevier Interactive Patient Education  2017 [employer].

## 2021-12-17 ENCOUNTER — Other Ambulatory Visit: Payer: Self-pay | Admitting: Oncology

## 2021-12-17 ENCOUNTER — Encounter: Payer: Self-pay | Admitting: Orthopaedic Surgery

## 2021-12-17 ENCOUNTER — Other Ambulatory Visit: Payer: Self-pay

## 2021-12-17 ENCOUNTER — Ambulatory Visit (INDEPENDENT_AMBULATORY_CARE_PROVIDER_SITE_OTHER): Payer: Medicare Other | Admitting: Orthopaedic Surgery

## 2021-12-17 VITALS — BP 177/83 | Ht 71.0 in | Wt 232.0 lb

## 2021-12-17 DIAGNOSIS — M25571 Pain in right ankle and joints of right foot: Secondary | ICD-10-CM

## 2021-12-17 DIAGNOSIS — L84 Corns and callosities: Secondary | ICD-10-CM

## 2021-12-17 DIAGNOSIS — C250 Malignant neoplasm of head of pancreas: Secondary | ICD-10-CM

## 2021-12-17 NOTE — Progress Notes (Signed)
Office Visit Note   Patient: [name]           Date of Birth: Jun 25, 1946           MRN: [id number] Visit Date: 12/17/2021              Requested by: [name], [name] G, MD [address] [place],  [place] [address] PCP: [name], [name] G, MD   Assessment & Plan: Visit Diagnoses:  1. Foot callus     Plan: His wife continue to file away at the callus every other day as she has been doing.  He can follow-up with me as needed.  We discussed as long as he continues to keep the callus soft he is unlikely to have any problems.  Return as needed.  Follow-Up Instructions: Return if symptoms worsen or fail to improve.   Orders:  No orders of the defined types were placed in this encounter.  No orders of the defined types were placed in this encounter.     Procedures: No procedures performed   Clinical Data: No additional findings.   Subjective: Chief Complaint  Patient presents with   Right Foot - Follow-up    HPI 76 year old male follow-up right foot callus with diagnosis of pancreatic adenocarcinoma.  Originally had blood blister was in the hospital and been on Eliquis, likes to walk barefooted.  He is finished his doxycycline and his wife's been trimming his foot with a glass type pumice stone that she obtained on [employer] and she states that the best thing she is ever found.  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  Review of Systems all the systems update unchanged from 10/22/2021.   Objective: Vital Signs: BP (!) 177/83    Ht 5\' 11"  (1.803 m)    Wt 232 lb (105.2 kg)    BMI 32.36 kg/m   Physical Exam Constitutional:      Appearance: He is well-developed.  HENT:     Head: Normocephalic and atraumatic.     Right Ear: External ear normal.     Left Ear: External ear normal.  Eyes:     Pupils: Pupils are equal, round, and reactive to light.  Neck:     Thyroid: No thyromegaly.     Trachea: No tracheal  deviation.  Cardiovascular:     Rate and Rhythm: Normal rate.  Pulmonary:     Effort: Pulmonary effort is normal.     Breath sounds: No wheezing.  Abdominal:     General: Bowel sounds are normal.     Palpations: Abdomen is soft.  Musculoskeletal:     Cervical back: Neck supple.  Skin:    General: Skin is warm and dry.     Capillary Refill: Capillary refill takes less than 2 seconds.  Neurological:     Mental Status: He is alert and oriented to person, place, and time.  Psychiatric:        Behavior: Behavior normal.        Thought Content: Thought content normal.        Judgment: Judgment normal.    Ortho Exam no plantar foot cellulitis.  Minimal calluses present just along the edge of the 2 mm round opening.  No point tenderness.  Peroneals are active.  Specialty Comments:  No specialty comments available.  Imaging: No results found.   PMFS History: Patient Active Problem List   Diagnosis Date Noted   Foot  callus 10/21/2021   Cellulitis 09/22/2021   Gastritis, 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   Malnutrition of moderate degree 07/02/2021   Pancreatic cancer ([place]) 07/01/2021   Pancreatic adenocarcinoma ([place]) 07/01/2021   Coronary artery disease of bypass graft of native heart with stable angina pectoris ([place]) 06/04/2021   Genetic testing 04/04/2021   History of prostate cancer    Family history of breast cancer    Port-A-Cath in place 03/14/2021   Atrial fibrillation ([place])    Syncope and collapse 03/04/2021   Primary cancer of body of pancreas ([facility]) 01/31/2021   Chest pain of uncertain etiology 92/42/6834   Elevated lipase 10/17/2020   H/O agent Orange exposure 10/17/2020   Dyslipidemia, goal LDL below 70 10/17/2020   Hx of CABG 02/28/2020   DM (diabetes mellitus), type 2 with renal complications ([place]) 19/62/2297   Hypertension with heart disease 04/01/2019   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   CKD (chronic kidney disease), stage III ([place]) 04/01/2019    Peripheral neuropathy 04/01/2019   Insomnia 11/30/2018   Past Medical History:  Diagnosis Date   A-fib ([facility] [facility]) 03/04/2021   Anemia    Arthritis    Cancer (HCC)    prostate   Chronic kidney disease    blood in urine    Constipation    Coronary artery disease    Depression    Diabetes mellitus without complication ([place])    Difficult intubation    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 it was hard to get the tube down his throat   Dyspnea    Family history of breast cancer    Fatty liver    Fatty liver    Frequent headaches    GERD (gastroesophageal reflux disease)    History of chicken pox    History of fainting spells of unknown cause    History of prostate cancer    Hyperlipidemia    Hypertension    Myocardial infarction ([place])    Peripheral neuropathy    Pneumonia    Prostate cancer (HCC)    PTSD (post-traumatic stress disorder)    Sleep apnea    uses Cpap    Family History  Problem Relation Age of Onset   Arthritis Mother    Heart disease Mother    Hyperlipidemia Mother    Hypertension Mother    Heart attack Mother    Breast cancer Sister        dx 49s   Heart attack Brother    Heart disease Brother    Depression Daughter    Arthritis Sister    Cancer Sister        unknown type, dx 73s   Cancer Brother    Learning disabilities Brother    Alcohol abuse Brother    COPD Brother    Heart attack Brother    Heart disease Brother     Past Surgical History:  Procedure Laterality Date   APPENDECTOMY     BIOPSY  01/15/2021   Procedure: BIOPSY;  Surgeon: Mansouraty, Telford Nab., MD;  Location[facility] ENDOSCOPY;  Service: Gastroenterology;;   CARDIAC CATHETERIZATION  06/26/2017   CARDIAC SURGERY     Triple Bypass   CHOLECYSTECTOMY  2010   COLONOSCOPY     CORONARY ARTERY BYPASS GRAFT  2004   ESOPHAGOGASTRODUODENOSCOPY (EGD) WITH PROPOFOL N/A 01/15/2021   Procedure: ESOPHAGOGASTRODUODENOSCOPY (EGD) WITH PROPOFOL;  Surgeon: Irving Copas., MD;  Location: [facility] ENDOSCOPY;   Service: Gastroenterology;  Laterality: N/A;   EUS N/A 01/15/2021   Procedure: UPPER ENDOSCOPIC  ULTRASOUND (EUS) RADIAL;  Surgeon: Rush Landmark Telford Nab., MD;  Location: [facility];  Service: Gastroenterology;  Laterality: N/A;   EYE SURGERY Bilateral    cataract removal   FINE NEEDLE ASPIRATION  01/15/2021   Procedure: FINE NEEDLE ASPIRATION (FNA) LINEAR;  Surgeon: Irving Copas., MD;  Location: [facility];  Service: Gastroenterology;;   LAPAROSCOPY N/A 07/01/2021   Procedure: STAGING LAPAROSCOPY;  Surgeon: Dwan Bolt, MD;  Location: [facility] [facility];  Service: General;  Laterality: N/A;   PORTACATH PLACEMENT Right 02/21/2021   Procedure: INSERTION PORT-A-CATH;  Surgeon: Dwan Bolt, MD;  Location: [facility];  Service: General;  Laterality: Right;   SPLENECTOMY, 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

## 2021-12-18 ENCOUNTER — Ambulatory Visit (HOSPITAL_BASED_OUTPATIENT_CLINIC_OR_DEPARTMENT_OTHER): Payer: Medicare Other | Attending: Family Medicine | Admitting: Physical Therapy

## 2021-12-18 ENCOUNTER — Encounter (HOSPITAL_BASED_OUTPATIENT_CLINIC_OR_DEPARTMENT_OTHER): Payer: Self-pay | Admitting: Physical Therapy

## 2021-12-18 ENCOUNTER — Other Ambulatory Visit: Payer: Self-pay

## 2021-12-18 DIAGNOSIS — R296 Repeated falls: Secondary | ICD-10-CM | POA: Insufficient documentation

## 2021-12-18 DIAGNOSIS — R2689 Other abnormalities of gait and mobility: Secondary | ICD-10-CM | POA: Insufficient documentation

## 2021-12-18 DIAGNOSIS — M6281 Muscle weakness (generalized): Secondary | ICD-10-CM | POA: Insufficient documentation

## 2021-12-18 NOTE — Therapy (Addendum)
OUTPATIENT PHYSICAL THERAPY TREATMENT NOTE/Progress note     Patient Name: [name] MRN: [id number] DOB:11-20-1946, 76 y.o., male Today's Date: 12/19/2021  PCP: [name], [name] G, MD REFERRING PROVIDER: [name], [name] G, MD   PT End of Session - 12/19/21 2110     Number of Visits 10  22    Date for PT Re-Evaluation 01/30/22    Authorization Type [employer]: progress note next visit    PT Start Time 1304    PT Stop Time 1345    PT Time Calculation (min) 41 min    Activity Tolerance Patient tolerated treatment well    Behavior During Therapy [facility] for tasks assessed/performed            Progress Note Reporting Period11/08/2022  to 12/18/2021  See note below for Objective Data and Assessment of Progress/Goals.      Past Medical History:  Diagnosis Date   A-fib ([place]) 03/04/2021   Anemia    Arthritis    Cancer (HCC)    prostate   Chronic kidney disease    blood in urine    Constipation    Coronary artery disease    Depression    Diabetes mellitus without complication ([place])    Difficult intubation    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 it was hard to get the tube down his throat   Dyspnea    Family history of breast cancer    Fatty liver    Fatty liver    Frequent headaches    GERD (gastroesophageal reflux disease)    History of chicken pox    History of fainting spells of unknown cause    History of prostate cancer    Hyperlipidemia    Hypertension    Myocardial infarction [facility])    Peripheral neuropathy    Pneumonia    Prostate cancer ([place])    PTSD (post-traumatic stress disorder)    Sleep apnea    uses Cpap   Past Surgical History:  Procedure Laterality Date   APPENDECTOMY     BIOPSY  01/15/2021   Procedure: BIOPSY;  Surgeon: Irving Copas., MD;  Location: [place];  Service: Gastroenterology;;   CARDIAC CATHETERIZATION  06/26/2017   CARDIAC SURGERY     Triple Bypass   CHOLECYSTECTOMY  2010   COLONOSCOPY     CORONARY ARTERY BYPASS GRAFT   2004   ESOPHAGOGASTRODUODENOSCOPY (EGD) WITH PROPOFOL N/A 01/15/2021   Procedure: ESOPHAGOGASTRODUODENOSCOPY (EGD) WITH PROPOFOL;  Surgeon: Irving Copas., MD;  Location: [facility] ENDOSCOPY;  Service: Gastroenterology;  Laterality: N/A;   EUS N/A 01/15/2021   Procedure: UPPER ENDOSCOPIC ULTRASOUND (EUS) RADIAL;  Surgeon: Irving Copas., MD;  Location: [place];  Service: Gastroenterology;  Laterality: N/A;   EYE SURGERY Bilateral    cataract removal   FINE NEEDLE ASPIRATION  01/15/2021   Procedure: FINE NEEDLE ASPIRATION (FNA) LINEAR;  Surgeon: Irving Copas., MD;  Location: [facility] ENDOSCOPY;  Service: Gastroenterology;;   LAPAROSCOPY N/A 07/01/2021   Procedure: STAGING LAPAROSCOPY;  Surgeon: Dwan Bolt, MD;  Location: [place];  Service: General;  Laterality: N/A;   PORTACATH PLACEMENT Right 02/21/2021   Procedure: INSERTION PORT-A-CATH;  Surgeon: Dwan Bolt, MD;  Location: [place];  Service: General;  Laterality: Right;   SPLENECTOMY, 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   Procedure: SPLENECTOMY;  Surgeon: Dwan Bolt, MD;  Location: [place];  Service: General;  Laterality: N/A;   TONSILLECTOMY  1958   Patient Active Problem List   Diagnosis Date Noted  Foot callus 10/21/2021   Cellulitis 09/22/2021   Gastritis, 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   Malnutrition of moderate degree 07/02/2021   Pancreatic cancer ([place]) 07/01/2021   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   Coronary artery disease of bypass graft of native heart with stable angina pectoris ([place]) 06/04/2021   Genetic testing 04/04/2021   History of prostate cancer    Family history of breast cancer    Port-A-Cath in place 03/14/2021   Atrial fibrillation ([place])    Syncope and collapse 03/04/2021   Primary cancer of body of pancreas ([place]) 01/31/2021   Chest pain of uncertain etiology 54/27/0623   Elevated lipase 10/17/2020   H/O agent Orange exposure 10/17/2020   Dyslipidemia, goal LDL below 70 10/17/2020   Hx of CABG  02/28/2020   DM (diabetes mellitus), type 2 with renal complications ([place]) 76/28/3151   Hypertension with heart disease 04/01/2019   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[place]) 04/01/2019   CKD (chronic kidney disease), stage III ([place]) 04/01/2019   Peripheral neuropathy 04/01/2019   Insomnia 11/30/2018  PCP: [name], [name] G, MD   REFERRING PROVIDER: [name], [name] G, MD   REFERRING DIAG: Frequent falls    THERAPY DIAG:  Frequent falls    ONSET DATE: Cancer diagnosis February   SUBJECTIVE:    SUBJECTIVE STATEMENT: Patient reports his blood sugar was low  today. He had some sugar and feels better. He has otherwise been doing well.   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 Yes, Number of falls: 3 major falls several small losses of balance The last time he fell he slid off the edge of the bed and couldn't hold himself; A few months ago he fell in the bathroom    LIVING ENVIRONMENT: Grab bars in the house; has steps in th house, but the patient dosen;'t have to go up them    OCCUPATION: retried   Works on computers   PLOF: Independent with household mobility with device   PATIENT GOALS    To improve strength and balance     Objective LE AROM/PROM:   A/PROM Right 10/25/2021 Left 10/25/2021  Hip flexion      Hip extension      Hip abduction      Hip adduction      Hip internal rotation      Hip external rotation      Knee flexion      Knee extension      Ankle dorsiflexion      Ankle plantarflexion      Ankle inversion      Ankle eversion      Unable to lie on his back for PROM of his hips; limited active hip flexion in standing      Grip :  right 20 lbs left 30 lbs  LE MMT:   MMT Right 10/25/2021  Left 10/25/2021  Hip flexion 16.5  1/4  33.4 15.6  1/4  34.1   Hip extension      Hip abduction 17.8  1/4  47.5  18.8  46.0  Hip adduction      Hip internal rotation      Hip external rotation      Knee flexion      Knee extension  29.4  46.1   23.9  39.3  Ankle dorsiflexion      Ankle plantarflexion      Ankle inversion      Ankle eversion       (Blank rows = not tested)       FUNCTIONAL TESTS:  Standing test: 40 seconds with CGA-> Min A before becoming off balance and needing to sit   1/4 160 seconds with CGA   Narrow base of support: min a  Narrow base with eyes closed mod a with 1 max a catch of his balance.    GAIT: Decreased weight on the rollator       L 40 lbs  R 30 lbs grip    Baseline  30 left  20 right      TODAY'S TREATMENT:  1/4 Assessed mobility and reviewed results of objective measures   Narrow base of support 20 sec eyes open CGA  Narrow base of support 20 sec eyes closed Min a   Tandem left and right 20 sec hold mod a      Standing 2x10 heel raise  Standing slow march 2x10     12/21 Nu-step L3 5 min  Supine:  Supine march 3x10  Supine clamshell 3x15 green  Supine ball squeeze 3x15    Seated LAQ 3x10   Standing 2x10 heel raise  Standing slow march 2x10                12/18 Nu-step L4 5 min  Bilateral er 3x10 red  Row 2x10 red  Shoulder extension 2x10 red    Knee extension 3x10 15lbs  Hip abduction 3x10 55 lbs    Narrow base: eyes open CGA 2x30 sec hold  Eyes closed 3x30 sec hold min to mod A first 2 trials: last max a for balance          12/15 Nu-step: 5 min L3  Cable Shoulder extension 5 lbs with guarding 310  Shoulder extension 5 lbs with guarding 3x10  Leg press 3x10 50 lbs    Narrow base eyes open min a 3x30  Narrow base eyes clsed mod to max assist eyes open    Heel raise to rock back 3x10 with min a for posterior balance        PATIENT EDUCATION:  Education details:  reviewed test and mesureas as well as POC going forward  Person educated: Patient Education method: Explanation, Demonstration, Tactile cues, and Verbal cues Education comprehension: verbalized understanding, returned demonstration, verbal cues required, tactile cues required, and needs further education     HOME EXERCISE PROGRAM: Access Code: [email or web address] URL: [email or web address] Date: 10/25/2021 Prepared by: Carolyne Littles   Exercises Seated Knee Extension with Resistance - 1 x daily - 7 x weekly - 3 sets - 10 reps Seated Hip Abduction with Resistance - 1 x daily - 7 x weekly - 3 sets - 10 reps Seated March - 1 x daily - 7 x weekly - 3 sets - 10 reps     ASSESSMENT:   CLINICAL IMPRESSION: Patient is making good progress. His strength measures have all improved significantly. His endurance standing tripled. He is walking with ess pressure on his walker. He continues to have falls at times. His goal is to progress to a cane. He was advised we can try as his balance improveds. His balance has improved but it is still limited    Objective impairments include Abnormal gait, decreased activity tolerance, decreased balance, decreased endurance, decreased mobility, difficulty  walking, decreased ROM, decreased strength, impaired UE functional use, and pain. These impairments are limiting patient from cleaning, driving, meal prep, yard work, and shopping. Personal factors including Age, Fitness, and 1-2 comorbidities: significant arthritis in his shoulders and peripheral neuropathy  are also affecting patient's functional outcome. Patient will benefit from skilled PT to address above impairments and improve overall function.   REHAB POTENTIAL: Good   CLINICAL DECISION MAKING: Evolving/moderate complexity more falls and declining mobility    EVALUATION COMPLEXITY: Moderate     GOALS: Goals reviewed with patient? Yes   SHORT TERM GOALS:   STG Name Target Date Goal status  1  Patient will increase gross LE strength by 5 lbs from time of progress note  Baseline:  11/22/2021 Achieved Significant improvement in strength goal reviewed    2 Patient will require min a with tandem stance with eyes closed Baseline:  11/22/2021 At times can complete with min a other days he requires more  Ongoing   3 Patient will ambulate 300' with improved posture and hip flexion  Baseline: 11/22/2021 Still leans heavily on walker but improving   LONG TERM GOALS:    LTG Name Target Date Goal status  1 BERG balance goal to be established  Baseline: 12/20/2021 BERG not assessed   2 Patient wil have no falls for a 2 week time period in order to improve safety  Baseline: 12/20/2021 Continues to have falls   3 Patient will report improved ability to perfrom ADLs without assistance  Baseline: 12/20/2021 Performing ADL's but still a fall risk  Ongoing   PLAN: PT FREQUENCY: 2x/week   PT DURATION: 8 weeks   PLANNED INTERVENTIONS: Therapeutic exercises, Therapeutic activity, Neuro Muscular re-education, Balance training, Gait training, Patient/Family education, Joint mobilization, Stair training, DME instructions, Aquatic Therapy, Electrical stimulation, Cryotherapy, Moist heat, Taping, and Manual therapy   PLAN FOR NEXT SESSION: Patient may need lift to get into the pool. In the pool work on gait, balance, and hip strengthening. On land develop his HEP for home including seated UE and LE exercises; assess tolerance to exercises         Carney Living PT DPT  12/19/2021, 9:14 PM

## 2021-12-19 ENCOUNTER — Encounter (HOSPITAL_BASED_OUTPATIENT_CLINIC_OR_DEPARTMENT_OTHER): Payer: Self-pay | Admitting: Nurse Practitioner

## 2021-12-19 ENCOUNTER — Ambulatory Visit (INDEPENDENT_AMBULATORY_CARE_PROVIDER_SITE_OTHER): Payer: Medicare Other | Admitting: Nurse Practitioner

## 2021-12-19 ENCOUNTER — Encounter (HOSPITAL_BASED_OUTPATIENT_CLINIC_OR_DEPARTMENT_OTHER): Payer: Self-pay | Admitting: Physical Therapy

## 2021-12-19 VITALS — BP 168/74 | HR 78 | Ht 71.0 in | Wt 240.0 lb

## 2021-12-19 DIAGNOSIS — I2510 Atherosclerotic heart disease of native coronary artery without angina pectoris: Secondary | ICD-10-CM | POA: Diagnosis not present

## 2021-12-19 DIAGNOSIS — E1159 Type 2 diabetes mellitus with other circulatory complications: Secondary | ICD-10-CM | POA: Diagnosis not present

## 2021-12-19 DIAGNOSIS — I10 Essential (primary) hypertension: Secondary | ICD-10-CM | POA: Diagnosis not present

## 2021-12-19 DIAGNOSIS — Z951 Presence of aortocoronary bypass graft: Secondary | ICD-10-CM

## 2021-12-19 NOTE — Progress Notes (Signed)
Cardiology Office Note:    Date:  12/19/2021   ID:  [name], [name] 1946-04-14, MRN [id number]  PCP:  [name], [name] G, MD   [facility] Providers Cardiologist:  Peter [name], MD     Referring MD: [name], [name] G, MD   Chief Complaint: elevated BP  History of Present Illness:    [name] is a 76 y.o. male with a hx of orthostatic hypotension, CAD s/p CABG 2004, diabetes mellitus, hyperlipidemia, atrial fibrillation, CKD, pancreatic cancer.   Previously followed by Dr. Lucia Gaskins, [facility] in [place]. Relocated to [place] in 2019.  Last cardiac catheterization and July 2019 showed 90% proximal LAD, occluded nondominant left circumflex and occluded RCA.  The grafts were patent including LIMA to the LAD, SVG to RCA and SVG to ramus with 40 to 50% proximal stenosis.  Normal EF and EDP.  [name] is a [place] veteran and has a history of PTSD and agent orange exposure. [name] had previously taken midodrine for orthostatic hypotension which eventually resolved and midodrine was discontinued. His wife states that the [place] had him on too much medicine.   Baseline creatinine is 1.4-1.5. [name] had pancreatectomy and splenectomy for pancreatic cancer in July 2022.  [name] was last seen in our office on 08/08/21 by Dr. [name] and 8-month follow-up was recommended.  [name] called our office on 10/11/21 with concerns for elevated BP.  [name] was advised to increase losartan to 100 mg daily.  On 11/26/2021 [name] again called to report elevated BP. [name] was advised to start amlodipine 2.5 mg daily.  Dr. [name] also recommended that [name] come in for an office visit.  Today, [name] is here with his wife.  [name] reports that his blood pressure has been better since [name] increased the losartan and started amlodipine but remains high at times. [name] denies chest pain, shortness of breath, lower extremity edema, fatigue, palpitations, melena, hematuria, hemoptysis, diaphoresis, weakness, presyncope, syncope, orthopnea, and  PND.   Past Medical History:  Diagnosis Date   A-fib ([place]) 03/04/2021   Anemia    Arthritis    Cancer (HCC)    prostate   Chronic kidney disease    blood in urine    Constipation    Coronary artery disease    Depression    Diabetes mellitus without complication ([place])    Difficult intubation    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 it was hard to get the tube down his throat   Dyspnea    Family history of breast cancer    Fatty liver    Fatty liver    Frequent headaches    GERD (gastroesophageal reflux disease)    History of chicken pox    History of fainting spells of unknown cause    History of prostate cancer    Hyperlipidemia    Hypertension    Myocardial infarction [facility])    Peripheral neuropathy    Pneumonia    Prostate cancer ([place])    PTSD (post-traumatic stress disorder)    Sleep apnea    uses Cpap    Past Surgical History:  Procedure Laterality Date   APPENDECTOMY     BIOPSY  01/15/2021   Procedure: BIOPSY;  Surgeon: Irving Copas., MD;  Location: [place];  Service: Gastroenterology;;   CARDIAC CATHETERIZATION  06/26/2017   CARDIAC SURGERY     Triple Bypass   CHOLECYSTECTOMY  2010   COLONOSCOPY     CORONARY ARTERY BYPASS GRAFT  2004   ESOPHAGOGASTRODUODENOSCOPY (EGD) WITH PROPOFOL  N/A 01/15/2021   Procedure: ESOPHAGOGASTRODUODENOSCOPY (EGD) WITH PROPOFOL;  Surgeon: Rush Landmark Telford Nab., MD;  Location: [place];  Service: Gastroenterology;  Laterality: N/A;   EUS N/A 01/15/2021   Procedure: UPPER ENDOSCOPIC ULTRASOUND (EUS) RADIAL;  Surgeon: Irving Copas., MD;  Location: [place];  Service: Gastroenterology;  Laterality: N/A;   EYE SURGERY Bilateral    cataract removal   FINE NEEDLE ASPIRATION  01/15/2021   Procedure: FINE NEEDLE ASPIRATION (FNA) LINEAR;  Surgeon: Irving Copas., MD;  Location: [facility] ENDOSCOPY;  Service: Gastroenterology;;   LAPAROSCOPY N/A 07/01/2021   Procedure: STAGING LAPAROSCOPY;  Surgeon: Dwan Bolt,  MD;  Location: [place];  Service: General;  Laterality: N/A;   PORTACATH PLACEMENT Right 02/21/2021   Procedure: INSERTION PORT-A-CATH;  Surgeon: Dwan Bolt, MD;  Location: [place];  Service: General;  Laterality: Right;   SPLENECTOMY, 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   Procedure: SPLENECTOMY;  Surgeon: Dwan Bolt, MD;  Location: [place];  Service: General;  Laterality: N/A;   TONSILLECTOMY  1958    Current Medications: Current Meds  Medication Sig   acetaminophen (TYLENOL) 325 MG tablet Take 2 tablets (650 mg total) by mouth every 6 (six) hours as needed for mild pain. (Patient taking differently: Take 650 mg by mouth every 6 (six) hours as needed for mild pain or fever.)   amLODipine (NORVASC) 2.5 MG tablet Take 1 tablet (2.5 mg total) by mouth daily.   apixaban (ELIQUIS) 5 MG TABS tablet Take 5 mg by mouth 2 (two) times daily.   atorvastatin (LIPITOR) 80 MG tablet Take 40 mg by mouth at bedtime.   calcium-vitamin D (OSCAL WITH D) 500-200 MG-UNIT tablet Take 1 tablet by mouth 2 (two) times daily.   clotrimazole-betamethasone (LOTRISONE) cream Apply 1 application topically daily as needed.   cycloSPORINE (RESTASIS) 0.05 % ophthalmic emulsion Place 1 drop into both eyes 2 (two) times daily as needed (dry eyes).   doxycycline (VIBRA-TABS) 100 MG tablet Take 1 tablet (100 mg total) by mouth 2 (two) times daily.   ferrous sulfate 325 (65 FE) MG tablet Take 1 tablet (325 mg total) by mouth daily with breakfast.   FLUoxetine (PROZAC) 20 MG capsule Take 40 mg by mouth daily.   glucose blood (ONETOUCH VERIO) test strip Use to test blood sugars 1-2 times daily.   insulin glargine (LANTUS) 100 UNIT/ML injection Inject 0.3 mLs (30 Units total) into the skin daily. (Patient taking differently: Inject 50 Units into the skin every morning.)   losartan (COZAAR) 100 MG tablet Take 1 tablet (100 mg total) by mouth daily.   Multiple Vitamin (MULTI-VITAMINS) TABS Take 1 tablet by mouth daily.   nystatin powder Apply 1  application topically 3 (three) times daily.   pantoprazole (PROTONIX) 40 MG tablet Take 1 tablet (40 mg total) by mouth daily.   potassium chloride SA (KLOR-CON M) 20 MEQ tablet TAKE 1 TABLET BY MOUTH EVERY DAY   tamsulosin (FLOMAX) 0.4 MG CAPS capsule Take 0.8 mg by mouth at bedtime.   traZODone (DESYREL) 100 MG tablet Take 100 mg by mouth at bedtime.   vitamin B-12 (CYANOCOBALAMIN) 1000 MCG tablet Take 1,000 mcg by mouth daily.     Allergies:   Furosemide, Keflex [cephalexin], Lisinopril, and Terazosin   Social History   Socioeconomic History   Marital status: Married    Spouse name: Not on file   Number of children: 2   Years of education: Not on file   Highest education level: Not on file  Occupational History   Occupation: retired  Tobacco Use   Smoking status: Former    Types: Cigarettes    Quit date: 12/14/1975    Years since quitting: 46.0   Smokeless tobacco: Never  Vaping Use   Vaping Use: Not on file  Substance and Sexual Activity   Alcohol use: Not Currently   Drug use: Never   Sexual activity: Not Currently  Other Topics Concern   Not on file  Social History Narrative   ** Merged History Encounter **       Social Determinants of Health   Financial Resource Strain: Low Risk    Difficulty of Paying Living Expenses: Not hard at all  Food Insecurity: No Food Insecurity   Worried About Charity fundraiser in the Last Year: Never true   [place] in the Last Year: Never true  Transportation Needs: No Transportation Needs   Lack of Transportation (Medical): No   Lack of Transportation (Non-Medical): No  Physical Activity: Insufficiently Active   Days of Exercise per Week: 2 days   Minutes of Exercise per Session: 60 min  Stress: No Stress Concern Present   Feeling of Stress : Not at all  Social Connections: Moderately Isolated   Frequency of Communication with Friends and Family: More than three times a week   Frequency of Social Gatherings with  Friends and Family: More than three times a week   Attends Religious Services: Never   Marine scientist or Organizations: No   Attends Music therapist: Never   Marital Status: Married     Family History: The patient's family history includes Alcohol abuse in his brother; Arthritis in his mother and sister; Breast cancer in his sister; COPD in his brother; Cancer in his brother and sister; Depression in his daughter; Heart attack in his brother, brother, and mother; Heart disease in his brother, brother, and mother; Hyperlipidemia in his mother; Hypertension in his mother; Learning disabilities in his brother.  ROS:   Please see the history of present illness.  All other systems reviewed and are negative.  Labs/Other Studies Reviewed:    The following studies were reviewed today:  Echo 03/04/21  Left Ventricle: Left ventricular ejection fraction, by estimation, is 60  to 65%. The left ventricle has normal function. The left ventricle has no  regional wall motion abnormalities. The left ventricular internal cavity  size was normal in size. There is  no left ventricular hypertrophy. Left ventricular diastolic parameters are indeterminate.  Right Ventricle: The right ventricular size is normal. Right vetricular  wall thickness was not assessed. Right ventricular systolic function is  normal. There is normal pulmonary artery systolic pressure. The tricuspid  regurgitant velocity is 2.28 m/s, and with an assumed right atrial pressure of 3 mmHg, the estimated right ventricular systolic pressure is 24.4 mmHg.  Left Atrium: Left atrial size was normal in size.  Right Atrium: Right atrial size was normal in size.  Pericardium: There is no evidence of pericardial effusion.  Mitral Valve: The mitral valve is normal in structure. No evidence of  mitral valve regurgitation.  Tricuspid Valve: The tricuspid valve is normal in structure. Tricuspid  valve regurgitation is trivial.   Aortic Valve: The aortic valve is normal in structure. Aortic valve  regurgitation is not visualized.  Pulmonic Valve: The pulmonic valve was normal in structure. Pulmonic valve  regurgitation is not visualized.  Aorta: The aortic root and ascending aorta are structurally normal, with  no evidence of dilitation.  Venous: The inferior vena cava is normal in size with greater than 50%  respiratory variability, suggesting right atrial pressure of 3 mmHg.   IAS/Shunts: No atrial level shunt detected by color flow Doppler.   Recent Labs: 03/05/2021: TSH 1.389 09/27/2021: Magnesium 1.8 10/29/2021: ALT 13; BUN 17; Creatinine 1.14; Potassium 4.3; Sodium 137 11/26/2021: Hemoglobin 9.7; Platelet Count 409  Recent Lipid Panel    Component Value Date/Time   CHOL 113 10/10/2020 0940   CHOL 101 12/16/2018 1003   TRIG 166 (H) 10/10/2020 0940   HDL 34 (L) 10/10/2020 0940   HDL 38 (L) 12/16/2018 1003   CHOLHDL 3.3 10/10/2020 0940   LDLCALC 55 10/10/2020 0940     Risk Assessment/Calculations:    CHA2DS2-VASc Score = 5  This indicates a 7.2% annual risk of stroke. The patient's score is based upon: CHF History: 0 HTN History: 1 Diabetes History: 1 Stroke History: 0 Vascular Disease History: 1 Age Score: 2 Gender Score: 0       Physical Exam:    VS:  BP (!) 168/74    Pulse 78    Ht 5\' 11"  (1.803 m)    Wt 240 lb (108.9 kg)    BMI 33.47 kg/m     Wt Readings from Last 3 Encounters:  12/19/21 240 lb (108.9 kg)  12/17/21 232 lb (105.2 kg)  12/06/21 249 lb (112.9 kg)     GEN:  Well nourished, well developed in no acute distress HEENT: Normal NECK: No JVD; No carotid bruits LYMPHATICS: No lymphadenopathy CARDIAC: RRR, no murmurs, rubs, gallops RESPIRATORY:  Clear to auscultation without rales, wheezing or rhonchi  ABDOMEN: Soft, non-tender, non-distended MUSCULOSKELETAL:  No edema; No deformity  SKIN: Warm and dry NEUROLOGIC:  Alert and oriented x 3 PSYCHIATRIC:  Normal affect    EKG:  EKG is not ordered today.   Diagnoses:    1. Essential hypertension   2. Coronary artery disease involving native coronary artery of native heart without angina pectoris   3. Hx of CABG   4. Diabetes mellitus with cardiac complication (HCC)    Assessment and Plan:     Essential hypertension: BP is elevated today. Upon my recheck it is 140/64 which is improved from initial reading. [name] admits to heavily salting his food. Encouraged reducing use of salt shaker and gave him instructions on appropriate way to monitor BP. I suspect that elevated BP readings only increase his anxiety and increase the BP. [name] is also checking BP immediately after waking up in the morning.  I asked him to record blood pressure readings 1 to 2 hours after medication for the next week and call us to report. Do not want to increase medication too quickly and cause orthostatic hypotension as [name] had in the past. Would favor changing losartan to different ARB (telmisartan, valsartan, or olmesartan). For now, will continue amlodipine and losartan.   CAD without angina history of CABG: [name] denies chest pain, dyspnea, or other symptoms concerning for angina. No indication for further ischemia evaluation at this time. Continue atorvastatin.   Hyperlipidemia LDL goal < 70: LDL 55 on 10/10/2020. Will need recheck at next office visit. Continue atorvastatin.   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 Reports dietary indiscretion but continues to work on improving his diet. Continue insulin, atorvastatin, losartan. Plan per PCP.   PAF: [name] denies palpitations or other concerning symptoms. EKG not done today. Heart rate sounds regular upon ausculation. HR stable at 78 bpm. Continue Eliquis.  Disposition: 6 months with Dr. Oval Linsey or APP      Medication Adjustments/Labs and Tests Ordered: Current medicines are reviewed at length with the patient today.  Concerns regarding medicines are outlined above.  No orders of the defined types  were placed in this encounter.  No orders of the defined types were placed in this encounter.   Patient Instructions  Medication Instructions:  Your physician recommends that you continue on your current medications as directed. Please refer to the Current Medication list given to you today.  *If you need a refill on your cardiac medications before your next appointment, please call your pharmacy*   Lab Work: None Ordered  Testing/Procedures: None Ordered   Follow-Up: At [facility], you and your health needs are our priority.  As part of our continuing mission to provide you with exceptional heart care, we have created designated Provider Care Teams.  These Care Teams include your primary Cardiologist (physician) and Advanced Practice Providers (APPs -  Physician Assistants and Nurse Practitioners) who all work together to provide you with the care you need, when you need it.  We recommend signing up for the patient portal called "MyChart".  Sign up information is provided on this After Visit Summary.  MyChart is used to connect with patients for Virtual Visits (Telemedicine).  Patients are able to view lab/test results, encounter notes, upcoming appointments, etc.  Non-urgent messages can be sent to your provider as well.   To learn more about what you can do with MyChart, go to [email or web address].    Your next appointment:   6 month(s)  The format for your next appointment:   In Person  Provider:   Skeet Latch, MD, Laurann Montana, NP, or Coletta Memos, NP    Other Instructions HOW TO TAKE YOUR BLOOD PRESSURE: Rest 5 minutes before taking your blood pressure.  Dont smoke or drink caffeinated beverages for at least 30 minutes before. Take your blood pressure before (not after) you eat. Sit comfortably with your back supported and both feet on the floor (dont cross your legs). Elevate your arm to heart level on a table or a desk. Use the proper sized cuff. It  should fit smoothly and snugly around your bare upper arm. There should be enough room to slip a fingertip under the cuff. The bottom edge of the cuff should be 1 inch above the crease of the elbow.   Tips to Measure your Blood Pressure Correctly  Here's what you can do to ensure a correct reading:  Don't drink a caffeinated beverage or smoke during the 30 minutes before the test.  Sit quietly for five minutes before the test begins.  During the measurement, sit in a chair with your feet on the floor and your arm supported so your elbow is at about heart level.  The inflatable part of the cuff should completely cover at least 80% of your upper arm, and the cuff should be placed on bare skin, not over a shirt.  Don't talk during the measurement.  Blood Pressure Log   Date   Time  Blood Pressure  Position  Example: Nov 1 9 AM 124/78 sitting  Signed, Emmaline Life, NP  12/19/2021 6:19 PM    [facility]

## 2021-12-19 NOTE — Patient Instructions (Addendum)
Medication Instructions:  Your physician recommends that you continue on your current medications as directed. Please refer to the Current Medication list given to you today.  *If you need a refill on your cardiac medications before your next appointment, please call your pharmacy*   Lab Work: None Ordered  Testing/Procedures: None Ordered   Follow-Up: At [facility], you and your health needs are our priority.  As part of our continuing mission to provide you with exceptional heart care, we have created designated Provider Care Teams.  These Care Teams include your primary Cardiologist (physician) and Advanced Practice Providers (APPs -  Physician Assistants and Nurse Practitioners) who all work together to provide you with the care you need, when you need it.  We recommend signing up for the patient portal called "MyChart".  Sign up information is provided on this After Visit Summary.  MyChart is used to connect with patients for Virtual Visits (Telemedicine).  Patients are able to view lab/test results, encounter notes, upcoming appointments, etc.  Non-urgent messages can be sent to your provider as well.   To learn more about what you can do with MyChart, go to [email or web address].    Your next appointment:   6 month(s)  The format for your next appointment:   In Person  Provider:   Skeet Latch, MD, Laurann Montana, NP, or Coletta Memos, NP    Other Instructions HOW TO TAKE YOUR BLOOD PRESSURE: Rest 5 minutes before taking your blood pressure.  Dont smoke or drink caffeinated beverages for at least 30 minutes before. Take your blood pressure before (not after) you eat. Sit comfortably with your back supported and both feet on the floor (dont cross your legs). Elevate your arm to heart level on a table or a desk. Use the proper sized cuff. It should fit smoothly and snugly around your bare upper arm. There should be enough room to slip a fingertip under the cuff.  The bottom edge of the cuff should be 1 inch above the crease of the elbow.   Tips to Measure your Blood Pressure Correctly  Here's what you can do to ensure a correct reading:  Don't drink a caffeinated beverage or smoke during the 30 minutes before the test.  Sit quietly for five minutes before the test begins.  During the measurement, sit in a chair with your feet on the floor and your arm supported so your elbow is at about heart level.  The inflatable part of the cuff should completely cover at least 80% of your upper arm, and the cuff should be placed on bare skin, not over a shirt.  Don't talk during the measurement.  Blood Pressure Log   Date   Time  Blood Pressure  Position  Example: Nov 1 9 AM 124/78 sitting

## 2021-12-20 ENCOUNTER — Other Ambulatory Visit: Payer: Self-pay

## 2021-12-20 ENCOUNTER — Ambulatory Visit (HOSPITAL_BASED_OUTPATIENT_CLINIC_OR_DEPARTMENT_OTHER): Payer: Medicare Other | Admitting: Physical Therapy

## 2021-12-20 ENCOUNTER — Encounter (HOSPITAL_BASED_OUTPATIENT_CLINIC_OR_DEPARTMENT_OTHER): Payer: Self-pay | Admitting: Physical Therapy

## 2021-12-20 DIAGNOSIS — R2689 Other abnormalities of gait and mobility: Secondary | ICD-10-CM

## 2021-12-20 DIAGNOSIS — R296 Repeated falls: Secondary | ICD-10-CM

## 2021-12-20 DIAGNOSIS — M6281 Muscle weakness (generalized): Secondary | ICD-10-CM | POA: Diagnosis not present

## 2021-12-20 NOTE — Therapy (Signed)
OUTPATIENT PHYSICAL THERAPY TREATMENT NOTE   Patient Name: [name] MRN: [id number] DOB:[birth year], 76 y.o., male Today's Date: 12/22/2021  PCP: [name], [name] G, MD REFERRING PROVIDER: [name], [name] G, MD   PT End of Session - 12/22/21 0940     Visit Number 11    Number of Visits 22    Date for PT Re-Evaluation 01/30/22    Authorization Type [employer]: progress note next visit    PT Start Time 1145    PT Stop Time 1224    PT Time Calculation (min) 39 min    Activity Tolerance Patient tolerated treatment well    Behavior During Therapy [facility] for tasks assessed/performed               Past Medical History:  Diagnosis Date   A-fib ([place]) 03/04/2021   Anemia    Arthritis    Cancer ([place])    prostate   Chronic kidney disease    blood in urine    Constipation    Coronary artery disease    Depression    Diabetes mellitus without complication ([place])    Difficult intubation    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 it was hard to get the tube down his throat   Dyspnea    Family history of breast cancer    Fatty liver    Fatty liver    Frequent headaches    GERD (gastroesophageal reflux disease)    History of chicken pox    History of fainting spells of unknown cause    History of prostate cancer    Hyperlipidemia    Hypertension    Myocardial infarction [facility])    Peripheral neuropathy    Pneumonia    Prostate cancer ([place])    PTSD (post-traumatic stress disorder)    Sleep apnea    uses Cpap   Past Surgical History:  Procedure Laterality Date   APPENDECTOMY     BIOPSY  01/15/2021   Procedure: BIOPSY;  Surgeon: Irving Copas., MD;  Location: [place];  Service: Gastroenterology;;   CARDIAC CATHETERIZATION  06/26/2017   CARDIAC SURGERY     Triple Bypass   CHOLECYSTECTOMY  2010   COLONOSCOPY     CORONARY ARTERY BYPASS GRAFT  2004   ESOPHAGOGASTRODUODENOSCOPY (EGD) WITH PROPOFOL N/A 01/15/2021   Procedure: ESOPHAGOGASTRODUODENOSCOPY (EGD) WITH PROPOFOL;   Surgeon: Irving Copas., MD;  Location: [facility] ENDOSCOPY;  Service: Gastroenterology;  Laterality: N/A;   EUS N/A 01/15/2021   Procedure: UPPER ENDOSCOPIC ULTRASOUND (EUS) RADIAL;  Surgeon: Irving Copas., MD;  Location: [place];  Service: Gastroenterology;  Laterality: N/A;   EYE SURGERY Bilateral    cataract removal   FINE NEEDLE ASPIRATION  01/15/2021   Procedure: FINE NEEDLE ASPIRATION (FNA) LINEAR;  Surgeon: Irving Copas., MD;  Location: [facility] ENDOSCOPY;  Service: Gastroenterology;;   LAPAROSCOPY N/A 07/01/2021   Procedure: STAGING LAPAROSCOPY;  Surgeon: Dwan Bolt, MD;  Location: [place];  Service: General;  Laterality: N/A;   PORTACATH PLACEMENT Right 02/21/2021   Procedure: INSERTION PORT-A-CATH;  Surgeon: Dwan Bolt, MD;  Location: [place];  Service: General;  Laterality: Right;   SPLENECTOMY, 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   Procedure: SPLENECTOMY;  Surgeon: Dwan Bolt, MD;  Location: [place];  Service: General;  Laterality: N/A;   TONSILLECTOMY  1958   Patient Active Problem List   Diagnosis Date Noted   Foot callus 10/21/2021   Cellulitis 09/22/2021   Gastritis, 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   Malnutrition of moderate degree 07/02/2021  Pancreatic cancer ([place]) 07/01/2021   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   Coronary artery disease of bypass graft of native heart with stable angina pectoris ([place]) 06/04/2021   Genetic testing 04/04/2021   History of prostate cancer    Family history of breast cancer    Port-A-Cath in place 03/14/2021   Atrial fibrillation ([place])    Syncope and collapse 03/04/2021   Primary cancer of body of pancreas ([place]) 01/31/2021   Chest pain of uncertain etiology 95/62/1308   Elevated lipase 10/17/2020   H/O agent Orange exposure 10/17/2020   Dyslipidemia, goal LDL below 70 10/17/2020   Hx of CABG 02/28/2020   DM (diabetes mellitus), type 2 with renal complications ([place]) 65/78/4696   Hypertension with heart disease 04/01/2019    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[place]) 04/01/2019   CKD (chronic kidney disease), stage III ([place]) 04/01/2019   Peripheral neuropathy 04/01/2019   Insomnia 11/30/2018  PCP: [name], [name] G, MD   REFERRING PROVIDER: [name], [name] G, MD   REFERRING DIAG: Frequent falls    THERAPY DIAG:  Frequent falls    ONSET DATE: Cancer diagnosis February   SUBJECTIVE:    SUBJECTIVE STATEMENT: Patient feels better today. He has no significant soreness from his fall. He has no other falls.  PERTINENT HISTORY: Pancreatic cancer; arthritis and debility in both shoulders; dyspnea with exertion; heart attack; blood pressure fluctuation; spleen removal      PAIN:  Left knee is sore    PRECAUTIONS: Fall   WEIGHT BEARING RESTRICTIONS No   FALLS:  Has patient fallen in last 6 months? Yes, Number of falls: 3 major falls several small losses of balance The last time he fell he slid off the edge of the bed and couldn't hold himself; A few months ago he fell in the bathroom    LIVING ENVIRONMENT: Grab bars in the house; has steps in th house, but the patient dosen;'t have to go up them    OCCUPATION: retried   Works on computers   PLOF: Independent with household mobility with device   PATIENT GOALS    To improve strength and balance     Objective   TODAY'S TREATMENT: 1/6 Seated march 2x10 2lb  Laq 2x10 2lb  Ball squeeze 20x  Clamshell x20   Row red x20  Shoulder extneison x20 red   Narrow base of support 20 sec eyes open CGA  Narrow base of support 20 sec eyes closed Min a   Tandem left and right 20 sec hold mod a   Standing 2x10 heel raise  Standing slow march 2x10     1/4 Assessed mobility and reviewed results of objective measures   Narrow base of support 20 sec eyes open CGA  Narrow base of support 20 sec eyes closed Min a   Tandem left and right 20 sec hold mod a      Standing 2x10 heel raise  Standing slow march 2x10     12/21 Nu-step L3 5 min   Supine:  Supine march 3x10  Supine clamshell 3x15 green  Supine ball squeeze 3x15    Seated LAQ 3x10   Standing 2x10 heel raise  Standing slow march 2x10                12/18 Nu-step L4 5 min  Bilateral er 3x10 red  Row 2x10 red  Shoulder extension 2x10 red    Knee extension 3x10 15lbs  Hip abduction 3x10 55 lbs    Narrow base: eyes  open CGA 2x30 sec hold  Eyes closed 3x30 sec hold min to mod A first 2 trials: last max a for balance          12/15 Nu-step: 5 min L3  Cable Shoulder extension 5 lbs with guarding 310  Shoulder extension 5 lbs with guarding 3x10  Leg press 3x10 50 lbs    Narrow base eyes open min a 3x30  Narrow base eyes clsed mod to max assist eyes open    Heel raise to rock back 3x10 with min a for posterior balance        PATIENT EDUCATION:  Education details: reviewed test and mesureas as well as POC going forward  Person educated: Patient Education method: Explanation, Demonstration, Tactile cues, and Verbal cues Education comprehension: verbalized understanding, returned demonstration, verbal cues required, tactile cues required, and needs further education     HOME EXERCISE PROGRAM: Access Code: [email or web address] URL: [email or web address] Date: 10/25/2021 Prepared by: Carolyne Littles   Exercises Seated Knee Extension with Resistance - 1 x daily - 7 x weekly - 3 sets - 10 reps Seated Hip Abduction with Resistance - 1 x daily - 7 x weekly - 3 sets - 10 reps Seated March - 1 x daily - 7 x weekly - 3 sets - 10 reps     ASSESSMENT:   CLINICAL IMPRESSION: Therapy reviewed seated series of exercises today. He would like to progress offf the walker but continues to have falls even with the walker. He was advised if that is his goal he will likely need to do his exercises at home as well as the clinic. We reviewed standing exercises today. Therapy will continue to progress as tolerated..    Objective impairments include Abnormal  gait, decreased activity tolerance, decreased balance, decreased endurance, decreased mobility, difficulty walking, decreased ROM, decreased strength, impaired UE functional use, and pain. These impairments are limiting patient from cleaning, driving, meal prep, yard work, and shopping. Personal factors including Age, Fitness, and 1-2 comorbidities: significant arthritis in his shoulders and peripheral neuropathy  are also affecting patient's functional outcome. Patient will benefit from skilled PT to address above impairments and improve overall function.   REHAB POTENTIAL: Good   CLINICAL DECISION MAKING: Evolving/moderate complexity more falls and declining mobility    EVALUATION COMPLEXITY: Moderate     GOALS: Goals reviewed with patient? Yes   SHORT TERM GOALS:   STG Name Target Date Goal status  1 Patient will increase gross LE strength by 5 lbs  Baseline:  11/22/2021 INITIAL  2 Patient will require min a with tandem stance with eyes closed Baseline:  11/22/2021 INITIAL  3 Patient will ambulate 300' with improved posture and hip flexion  Baseline: 11/22/2021 INITIAL  LONG TERM GOALS:    LTG Name Target Date Goal status  1 BERG balance goal to be established  Baseline: 12/20/2021 INITIAL  2 Patient wil have no falls for a 2 week time period in order to improve safety  Baseline: 12/20/2021 INITIAL  3 Patient will report improved ability to perfrom ADLs without assistance  Baseline: 12/20/2021 INITIAL  PLAN: PT FREQUENCY: 2x/week   PT DURATION: 8 weeks   PLANNED INTERVENTIONS: Therapeutic exercises, Therapeutic activity, Neuro Muscular re-education, Balance training, Gait training, Patient/Family education, Joint mobilization, Stair training, DME instructions, Aquatic Therapy, Electrical stimulation, Cryotherapy, Moist heat, Taping, and Manual therapy   PLAN FOR NEXT SESSION: Patient may need lift to get into the pool. In the pool work on gait, balance, and hip strengthening. On  land  develop his HEP for home including seated UE and LE exercises; assess tolerance to exercises         Carney Living PT DPT  12/22/2021, 9:41 AM

## 2021-12-22 ENCOUNTER — Encounter (HOSPITAL_BASED_OUTPATIENT_CLINIC_OR_DEPARTMENT_OTHER): Payer: Self-pay | Admitting: Physical Therapy

## 2021-12-23 ENCOUNTER — Other Ambulatory Visit: Payer: Self-pay

## 2021-12-23 ENCOUNTER — Ambulatory Visit (HOSPITAL_BASED_OUTPATIENT_CLINIC_OR_DEPARTMENT_OTHER): Payer: Medicare Other | Admitting: Physical Therapy

## 2021-12-23 ENCOUNTER — Encounter (HOSPITAL_BASED_OUTPATIENT_CLINIC_OR_DEPARTMENT_OTHER): Payer: Self-pay | Admitting: Physical Therapy

## 2021-12-23 DIAGNOSIS — R2689 Other abnormalities of gait and mobility: Secondary | ICD-10-CM | POA: Diagnosis not present

## 2021-12-23 DIAGNOSIS — R296 Repeated falls: Secondary | ICD-10-CM

## 2021-12-23 DIAGNOSIS — M6281 Muscle weakness (generalized): Secondary | ICD-10-CM

## 2021-12-23 NOTE — Therapy (Signed)
OUTPATIENT PHYSICAL THERAPY TREATMENT NOTE   Patient Name: [name] MRN: [id number] DOB:1946-05-10, 76 y.o., male Today's Date: 12/23/2021  PCP: [name], [name] G, MD REFERRING PROVIDER: [name], [name] G, MD   PT End of Session - 12/23/21 1112     Visit Number 12    Number of Visits 22    Date for PT Re-Evaluation 01/30/22    Authorization Type [employer]: progress note next visit    PT Start Time 1100    PT Stop Time 1140    PT Time Calculation (min) 40 min    Activity Tolerance Patient tolerated treatment well    Behavior During Therapy [facility] for tasks assessed/performed               Past Medical History:  Diagnosis Date   A-fib ([place]) 03/04/2021   Anemia    Arthritis    Cancer ([place])    prostate   Chronic kidney disease    blood in urine    Constipation    Coronary artery disease    Depression    Diabetes mellitus without complication ([place])    Difficult intubation    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 it was hard to get the tube down his throat   Dyspnea    Family history of breast cancer    Fatty liver    Fatty liver    Frequent headaches    GERD (gastroesophageal reflux disease)    History of chicken pox    History of fainting spells of unknown cause    History of prostate cancer    Hyperlipidemia    Hypertension    Myocardial infarction [facility])    Peripheral neuropathy    Pneumonia    Prostate cancer ([place])    PTSD (post-traumatic stress disorder)    Sleep apnea    uses Cpap   Past Surgical History:  Procedure Laterality Date   APPENDECTOMY     BIOPSY  01/15/2021   Procedure: BIOPSY;  Surgeon: Irving Copas., MD;  Location: [place];  Service: Gastroenterology;;   CARDIAC CATHETERIZATION  06/26/2017   CARDIAC SURGERY     Triple Bypass   CHOLECYSTECTOMY  2010   COLONOSCOPY     CORONARY ARTERY BYPASS GRAFT  2004   ESOPHAGOGASTRODUODENOSCOPY (EGD) WITH PROPOFOL N/A 01/15/2021   Procedure: ESOPHAGOGASTRODUODENOSCOPY (EGD) WITH PROPOFOL;   Surgeon: Irving Copas., MD;  Location: [facility] ENDOSCOPY;  Service: Gastroenterology;  Laterality: N/A;   EUS N/A 01/15/2021   Procedure: UPPER ENDOSCOPIC ULTRASOUND (EUS) RADIAL;  Surgeon: Irving Copas., MD;  Location: [place];  Service: Gastroenterology;  Laterality: N/A;   EYE SURGERY Bilateral    cataract removal   FINE NEEDLE ASPIRATION  01/15/2021   Procedure: FINE NEEDLE ASPIRATION (FNA) LINEAR;  Surgeon: Irving Copas., MD;  Location: [facility] ENDOSCOPY;  Service: Gastroenterology;;   LAPAROSCOPY N/A 07/01/2021   Procedure: STAGING LAPAROSCOPY;  Surgeon: Dwan Bolt, MD;  Location: [place];  Service: General;  Laterality: N/A;   PORTACATH PLACEMENT Right 02/21/2021   Procedure: INSERTION PORT-A-CATH;  Surgeon: Dwan Bolt, MD;  Location: [place];  Service: General;  Laterality: Right;   SPLENECTOMY, 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   Procedure: SPLENECTOMY;  Surgeon: Dwan Bolt, MD;  Location: [place];  Service: General;  Laterality: N/A;   TONSILLECTOMY  1958   Patient Active Problem List   Diagnosis Date Noted   Foot callus 10/21/2021   Cellulitis 09/22/2021   Gastritis, 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   Malnutrition of moderate degree 07/02/2021  Pancreatic cancer ([place]) 07/01/2021   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   Coronary artery disease of bypass graft of native heart with stable angina pectoris ([place]) 06/04/2021   Genetic testing 04/04/2021   History of prostate cancer    Family history of breast cancer    Port-A-Cath in place 03/14/2021   Atrial fibrillation ([place])    Syncope and collapse 03/04/2021   Primary cancer of body of pancreas ([place]) 01/31/2021   Chest pain of uncertain etiology 31/54/0086   Elevated lipase 10/17/2020   H/O agent Orange exposure 10/17/2020   Dyslipidemia, goal LDL below 70 10/17/2020   Hx of CABG 02/28/2020   DM (diabetes mellitus), type 2 with renal complications ([place]) 76/19/5093   Hypertension with heart disease 04/01/2019    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[place]) 04/01/2019   CKD (chronic kidney disease), stage III ([place]) 04/01/2019   Peripheral neuropathy 04/01/2019   Insomnia 11/30/2018  PCP: [name], [name] G, MD   REFERRING PROVIDER: [name], [name] G, MD   REFERRING DIAG: Frequent falls    THERAPY DIAG:  Frequent falls    ONSET DATE: Cancer diagnosis February   SUBJECTIVE:    SUBJECTIVE STATEMENT: Patient feels better today. He has no significant soreness from his fall. He has no other falls.  PERTINENT HISTORY: Pancreatic cancer; arthritis and debility in both shoulders; dyspnea with exertion; heart attack; blood pressure fluctuation; spleen removal      PAIN:  Left knee is sore    PRECAUTIONS: Fall   WEIGHT BEARING RESTRICTIONS No   FALLS:  Has patient fallen in last 6 months? Yes, Number of falls: 3 major falls several small losses of balance The last time he fell he slid off the edge of the bed and couldn't hold himself; A few months ago he fell in the bathroom    LIVING ENVIRONMENT: Grab bars in the house; has steps in th house, but the patient dosen;'t have to go up them    OCCUPATION: retried   Works on computers   PLOF: Independent with household mobility with device   PATIENT GOALS    To improve strength and balance     Objective   TODAY'S TREATMENT: 1/9 reviewed use of gym equipment   Knee extension 20 lbs 3x10  Row 3x10 25 lbs  Hip abduction 3x10 30 lbs   Seated bilateral er yellow 2x10  Horizontal abduction 2x10 yellow Biceps curls 3x10 1lb 2lbs was too muc   1/6 Seated march 2x10 2lb  Laq 2x10 2lb  Ball squeeze 20x  Clamshell x20   Row red x20  Shoulder extneison x20 red   Narrow base of support 20 sec eyes open CGA  Narrow base of support 20 sec eyes closed Min a   Tandem left and right 20 sec hold mod a   Standing 2x10 heel raise  Standing slow march 2x10     1/4 Assessed mobility and reviewed results of objective measures   Narrow  base of support 20 sec eyes open CGA  Narrow base of support 20 sec eyes closed Min a   Tandem left and right 20 sec hold mod a      Standing 2x10 heel raise  Standing slow march 2x10     12/21 Nu-step L3 5 min  Supine:  Supine march 3x10  Supine clamshell 3x15 green  Supine ball squeeze 3x15    Seated LAQ 3x10   Standing 2x10 heel raise  Standing slow march 2x10  12/18 Nu-step L4 5 min  Bilateral er 3x10 red  Row 2x10 red  Shoulder extension 2x10 red    Knee extension 3x10 15lbs  Hip abduction 3x10 55 lbs    Narrow base: eyes open CGA 2x30 sec hold  Eyes closed 3x30 sec hold min to mod A first 2 trials: last max a for balance          12/15 Nu-step: 5 min L3  Cable Shoulder extension 5 lbs with guarding 310  Shoulder extension 5 lbs with guarding 3x10  Leg press 3x10 50 lbs    Narrow base eyes open min a 3x30  Narrow base eyes clsed mod to max assist eyes open    Heel raise to rock back 3x10 with min a for posterior balance        PATIENT EDUCATION:  Education details: reviewed test and mesureas as well as POC going forward  Person educated: Patient Education method: Explanation, Demonstration, Tactile cues, and Verbal cues Education comprehension: verbalized understanding, returned demonstration, verbal cues required, tactile cues required, and needs further education     HOME EXERCISE PROGRAM: Access Code: [email or web address] URL: [email or web address] Date: 10/25/2021 Prepared by: Carolyne Littles   Exercises Seated Knee Extension with Resistance - 1 x daily - 7 x weekly - 3 sets - 10 reps Seated Hip Abduction with Resistance - 1 x daily - 7 x weekly - 3 sets - 10 reps Seated March - 1 x daily - 7 x weekly - 3 sets - 10 reps     ASSESSMENT:   CLINICAL IMPRESSION: Te patient was very fatigued after his gym activity. He reported feeling weakness in his legs. He recovered after a few minutes. Therapy reviewed UE exercises  after that. We will continue to progress balance and gym activity, but we will be careful not to over-exert him with his gym activity. He did well with his UE exercises despite baseline issues with his left shoulder.  Next visit we may try his balance exercises before his gym exercises depending on how he did the first time.   Objective impairments include Abnormal gait, decreased activity tolerance, decreased balance, decreased endurance, decreased mobility, difficulty walking, decreased ROM, decreased strength, impaired UE functional use, and pain. These impairments are limiting patient from cleaning, driving, meal prep, yard work, and shopping. Personal factors including Age, Fitness, and 1-2 comorbidities: significant arthritis in his shoulders and peripheral neuropathy  are also affecting patient's functional outcome. Patient will benefit from skilled PT to address above impairments and improve overall function.   REHAB POTENTIAL: Good   CLINICAL DECISION MAKING: Evolving/moderate complexity more falls and declining mobility    EVALUATION COMPLEXITY: Moderate     GOALS: Goals reviewed with patient? Yes   SHORT TERM GOALS:   STG Name Target Date Goal status  1 Patient will increase gross LE strength by 5 lbs  Baseline:  11/22/2021 INITIAL  2 Patient will require min a with tandem stance with eyes closed Baseline:  11/22/2021 INITIAL  3 Patient will ambulate 300' with improved posture and hip flexion  Baseline: 11/22/2021 INITIAL  LONG TERM GOALS:    LTG Name Target Date Goal status  1 BERG balance goal to be established  Baseline: 12/20/2021 INITIAL  2 Patient wil have no falls for a 2 week time period in order to improve safety  Baseline: 12/20/2021 INITIAL  3 Patient will report improved ability to perfrom ADLs without assistance  Baseline: 12/20/2021 INITIAL  PLAN: PT FREQUENCY: 2x/week   PT  DURATION: 8 weeks   PLANNED INTERVENTIONS: Therapeutic exercises, Therapeutic activity,  Neuro Muscular re-education, Balance training, Gait training, Patient/Family education, Joint mobilization, Stair training, DME instructions, Aquatic Therapy, Electrical stimulation, Cryotherapy, Moist heat, Taping, and Manual therapy   PLAN FOR NEXT SESSION: Patient may need lift to get into the pool. In the pool work on gait, balance, and hip strengthening. On land develop his HEP for home including seated UE and LE exercises; assess tolerance to exercises         Carney Living PT DPT  12/23/2021, 11:17 AM

## 2021-12-24 ENCOUNTER — Encounter (HOSPITAL_BASED_OUTPATIENT_CLINIC_OR_DEPARTMENT_OTHER): Payer: Self-pay | Admitting: Physical Therapy

## 2021-12-25 ENCOUNTER — Other Ambulatory Visit: Payer: Self-pay

## 2021-12-25 ENCOUNTER — Ambulatory Visit (HOSPITAL_BASED_OUTPATIENT_CLINIC_OR_DEPARTMENT_OTHER): Payer: Medicare Other | Admitting: Physical Therapy

## 2021-12-25 DIAGNOSIS — M6281 Muscle weakness (generalized): Secondary | ICD-10-CM

## 2021-12-25 DIAGNOSIS — R2689 Other abnormalities of gait and mobility: Secondary | ICD-10-CM

## 2021-12-25 DIAGNOSIS — R296 Repeated falls: Secondary | ICD-10-CM

## 2021-12-26 ENCOUNTER — Encounter (HOSPITAL_BASED_OUTPATIENT_CLINIC_OR_DEPARTMENT_OTHER): Payer: Self-pay | Admitting: Physical Therapy

## 2021-12-26 NOTE — Therapy (Signed)
OUTPATIENT PHYSICAL THERAPY TREATMENT NOTE   Patient Name: [name] MRN: [id number] DOB:[birth year], 76 y.o., male Today's Date: 12/26/2021  PCP: [name], [name] G, MD REFERRING PROVIDER: [name], [name] G, MD   PT End of Session - 12/26/21 1108     Visit Number 13    Number of Visits 22    Date for PT Re-Evaluation 01/30/22    Authorization Type [employer]: progress note next visit    PT Start Time 1145    PT Stop Time 1228    PT Time Calculation (min) 43 min    Activity Tolerance Patient tolerated treatment well    Behavior During Therapy [facility] for tasks assessed/performed               Past Medical History:  Diagnosis Date   A-fib ([place]) 03/04/2021   Anemia    Arthritis    Cancer ([place])    prostate   Chronic kidney disease    blood in urine    Constipation    Coronary artery disease    Depression    Diabetes mellitus without complication ([place])    Difficult intubation    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 it was hard to get the tube down his throat   Dyspnea    Family history of breast cancer    Fatty liver    Fatty liver    Frequent headaches    GERD (gastroesophageal reflux disease)    History of chicken pox    History of fainting spells of unknown cause    History of prostate cancer    Hyperlipidemia    Hypertension    Myocardial infarction [facility])    Peripheral neuropathy    Pneumonia    Prostate cancer ([place])    PTSD (post-traumatic stress disorder)    Sleep apnea    uses Cpap   Past Surgical History:  Procedure Laterality Date   APPENDECTOMY     BIOPSY  01/15/2021   Procedure: BIOPSY;  Surgeon: Irving Copas., MD;  Location: [place];  Service: Gastroenterology;;   CARDIAC CATHETERIZATION  06/26/2017   CARDIAC SURGERY     Triple Bypass   CHOLECYSTECTOMY  2010   COLONOSCOPY     CORONARY ARTERY BYPASS GRAFT  2004   ESOPHAGOGASTRODUODENOSCOPY (EGD) WITH PROPOFOL N/A 01/15/2021   Procedure: ESOPHAGOGASTRODUODENOSCOPY (EGD) WITH PROPOFOL;   Surgeon: Irving Copas., MD;  Location[facility] ENDOSCOPY;  Service: Gastroenterology;  Laterality: N/A;   EUS N/A 01/15/2021   Procedure: UPPER ENDOSCOPIC ULTRASOUND (EUS) RADIAL;  Surgeon: Irving Copas., MD;  Location: [place];  Service: Gastroenterology;  Laterality: N/A;   EYE SURGERY Bilateral    cataract removal   FINE NEEDLE ASPIRATION  01/15/2021   Procedure: FINE NEEDLE ASPIRATION (FNA) LINEAR;  Surgeon: Irving Copas., MD;  Location: [facility] ENDOSCOPY;  Service: Gastroenterology;;   LAPAROSCOPY N/A 07/01/2021   Procedure: STAGING LAPAROSCOPY;  Surgeon: Dwan Bolt, MD;  Location: [place];  Service: General;  Laterality: N/A;   PORTACATH PLACEMENT Right 02/21/2021   Procedure: INSERTION PORT-A-CATH;  Surgeon: Dwan Bolt, MD;  Location: [place];  Service: General;  Laterality: Right;   SPLENECTOMY, 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   Procedure: SPLENECTOMY;  Surgeon: Dwan Bolt, MD;  Location: [place];  Service: General;  Laterality: N/A;   TONSILLECTOMY  1958   Patient Active Problem List   Diagnosis Date Noted   Foot callus 10/21/2021   Cellulitis 09/22/2021   Gastritis, 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   Malnutrition of moderate degree 07/02/2021  Pancreatic cancer ([place]) 07/01/2021   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   Coronary artery disease of bypass graft of native heart with stable angina pectoris ([place]) 06/04/2021   Genetic testing 04/04/2021   History of prostate cancer    Family history of breast cancer    Port-A-Cath in place 03/14/2021   Atrial fibrillation ([place])    Syncope and collapse 03/04/2021   Primary cancer of body of pancreas ([place]) 01/31/2021   Chest pain of uncertain etiology 56/38/7564   Elevated lipase 10/17/2020   H/O agent Orange exposure 10/17/2020   Dyslipidemia, goal LDL below 70 10/17/2020   Hx of CABG 02/28/2020   DM (diabetes mellitus), type 2 with renal complications ([place]) 33/29/5188   Hypertension with heart disease 04/01/2019    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[place]) 04/01/2019   CKD (chronic kidney disease), stage III ([place]) 04/01/2019   Peripheral neuropathy 04/01/2019   Insomnia 11/30/2018  PCP: [name], [name] G, MD   REFERRING PROVIDER: [name], [name] G, MD   REFERRING DIAG: Frequent falls    THERAPY DIAG:  Frequent falls    ONSET DATE: Cancer diagnosis February   SUBJECTIVE:    SUBJECTIVE STATEMENT: Patient feels like he worked out last time but he had no isgnificant soreness. He has not had any falls over the past few days.  PERTINENT HISTORY: Pancreatic cancer; arthritis and debility in both shoulders; dyspnea with exertion; heart attack; blood pressure fluctuation; spleen removal      PAIN:  Left knee is sore    PRECAUTIONS: Fall   WEIGHT BEARING RESTRICTIONS No   FALLS:  Has patient fallen in last 6 months? Yes, Number of falls: 3 major falls several small losses of balance The last time he fell he slid off the edge of the bed and couldn't hold himself; A few months ago he fell in the bathroom    LIVING ENVIRONMENT: Grab bars in the house; has steps in th house, but the patient dosen;'t have to go up them    OCCUPATION: retried   Works on computers   PLOF: Independent with household mobility with device   PATIENT GOALS    To improve strength and balance     Objective   TODAY'S TREATMENT: 1/11 Nu-step 6 min L3  Row 3x10 10 lbs first set next 2 15 lbs  Shoulder extension 3x10 10 lbs   Supine ball squeeze x30  Supine hip abduction x30  Supine march 2x15 each each leg.     1/9 reviewed use of gym equipment   Knee extension 20 lbs 3x10  Row 3x10 25 lbs  Hip abduction 3x10 30 lbs   Seated bilateral er yellow 2x10  Horizontal abduction 2x10 yellow Biceps curls 3x10 1lb 2lbs was too muc   1/6 Seated march 2x10 2lb  Laq 2x10 2lb  Ball squeeze 20x  Clamshell x20   Row red x20  Shoulder extneison x20 red   Narrow base of support 20 sec eyes open CGA  Narrow  base of support 20 sec eyes closed Min a   Tandem left and right 20 sec hold mod a   Standing 2x10 heel raise  Standing slow march 2x10     1/4 Assessed mobility and reviewed results of objective measures   Narrow base of support 20 sec eyes open CGA  Narrow base of support 20 sec eyes closed Min a   Tandem left and right 20 sec hold mod a      Standing 2x10 heel raise  Standing  slow march 2x10     12/21 Nu-step L3 5 min  Supine:  Supine march 3x10  Supine clamshell 3x15 green  Supine ball squeeze 3x15    Seated LAQ 3x10   Standing 2x10 heel raise  Standing slow march 2x10                12/18 Nu-step L4 5 min  Bilateral er 3x10 red  Row 2x10 red  Shoulder extension 2x10 red    Knee extension 3x10 15lbs  Hip abduction 3x10 55 lbs    Narrow base: eyes open CGA 2x30 sec hold  Eyes closed 3x30 sec hold min to mod A first 2 trials: last max a for balance          12/15 Nu-step: 5 min L3  Cable Shoulder extension 5 lbs with guarding 310  Shoulder extension 5 lbs with guarding 3x10  Leg press 3x10 50 lbs    Narrow base eyes open min a 3x30  Narrow base eyes clsed mod to max assist eyes open    Heel raise to rock back 3x10 with min a for posterior balance        PATIENT EDUCATION:  Education details: reviewed test and mesureas as well as POC going forward  Person educated: Patient Education method: Explanation, Demonstration, Tactile cues, and Verbal cues Education comprehension: verbalized understanding, returned demonstration, verbal cues required, tactile cues required, and needs further education     HOME EXERCISE PROGRAM: Access Code: [email or web address] URL: [email or web address] Date: 10/25/2021 Prepared by: Carolyne Littles   Exercises Seated Knee Extension with Resistance - 1 x daily - 7 x weekly - 3 sets - 10 reps Seated Hip Abduction with Resistance - 1 x daily - 7 x weekly - 3 sets - 10 reps Seated March - 1 x daily - 7 x  weekly - 3 sets - 10 reps     ASSESSMENT:   CLINICAL IMPRESSION: Patient continues to tolerate treatment well. He worked UE exercises on the cable today. Again he was fatigued but tolerated well. His daughter is joining the club. He plans on doing the same as his balance and comfortability improves with his gym exercises. We also reviewed a supine position today.   Objective impairments include Abnormal gait, decreased activity tolerance, decreased balance, decreased endurance, decreased mobility, difficulty walking, decreased ROM, decreased strength, impaired UE functional use, and pain. These impairments are limiting patient from cleaning, driving, meal prep, yard work, and shopping. Personal factors including Age, Fitness, and 1-2 comorbidities: significant arthritis in his shoulders and peripheral neuropathy  are also affecting patient's functional outcome. Patient will benefit from skilled PT to address above impairments and improve overall function.   REHAB POTENTIAL: Good   CLINICAL DECISION MAKING: Evolving/moderate complexity more falls and declining mobility    EVALUATION COMPLEXITY: Moderate     GOALS: Goals reviewed with patient? Yes   SHORT TERM GOALS:   STG Name Target Date Goal status  1 Patient will increase gross LE strength by 5 lbs  Baseline:  11/22/2021 INITIAL  2 Patient will require min a with tandem stance with eyes closed Baseline:  11/22/2021 INITIAL  3 Patient will ambulate 300' with improved posture and hip flexion  Baseline: 11/22/2021 INITIAL  LONG TERM GOALS:    LTG Name Target Date Goal status  1 BERG balance goal to be established  Baseline: 12/20/2021 INITIAL  2 Patient wil have no falls for a 2 week time period in order to improve safety  Baseline: 12/20/2021  INITIAL  3 Patient will report improved ability to perfrom ADLs without assistance  Baseline: 12/20/2021 INITIAL  PLAN: PT FREQUENCY: 2x/week   PT DURATION: 8 weeks   PLANNED INTERVENTIONS:  Therapeutic exercises, Therapeutic activity, Neuro Muscular re-education, Balance training, Gait training, Patient/Family education, Joint mobilization, Stair training, DME instructions, Aquatic Therapy, Electrical stimulation, Cryotherapy, Moist heat, Taping, and Manual therapy   PLAN FOR NEXT SESSION: Patient may need lift to get into the pool. In the pool work on gait, balance, and hip strengthening. On land develop his HEP for home including seated UE and LE exercises; assess tolerance to exercises         Carney Living PT DPT  12/26/2021, 11:26 AM

## 2021-12-30 ENCOUNTER — Other Ambulatory Visit: Payer: Self-pay

## 2021-12-30 ENCOUNTER — Telehealth (HOSPITAL_BASED_OUTPATIENT_CLINIC_OR_DEPARTMENT_OTHER): Payer: Self-pay | Admitting: Family

## 2021-12-30 ENCOUNTER — Ambulatory Visit (HOSPITAL_BASED_OUTPATIENT_CLINIC_OR_DEPARTMENT_OTHER): Payer: Medicare Other | Admitting: Physical Therapy

## 2021-12-30 ENCOUNTER — Encounter (HOSPITAL_BASED_OUTPATIENT_CLINIC_OR_DEPARTMENT_OTHER): Payer: Self-pay | Admitting: Physical Therapy

## 2021-12-30 DIAGNOSIS — I10 Essential (primary) hypertension: Secondary | ICD-10-CM

## 2021-12-30 DIAGNOSIS — R296 Repeated falls: Secondary | ICD-10-CM

## 2021-12-30 DIAGNOSIS — R2689 Other abnormalities of gait and mobility: Secondary | ICD-10-CM

## 2021-12-30 DIAGNOSIS — M6281 Muscle weakness (generalized): Secondary | ICD-10-CM | POA: Diagnosis not present

## 2021-12-30 MED ORDER — VALSARTAN 160 MG PO TABS: 160.0000 mg | ORAL_TABLET | Freq: Every day | ORAL | 6 refills | Status: DC

## 2021-12-30 NOTE — Telephone Encounter (Signed)
Patient brought in blood pressure log from his recent office visit as detailed below:  Date Time BP  12/20/21 11 AM  143/67  12/21/21 11 AM  171/72  12/22/21 11 AM  158/70  12/23/21 11 AM  154/69  12/24/21 11 AM  157/72  12/25/21 11 AM  162/76  12/26/21 11 AM  170/79  12/27/21 11 AM  164/74  12/28/21 11 AM  177/83   Will route to Christen Bame, NP for her input.   Loel Dubonnet, NP

## 2021-12-30 NOTE — Telephone Encounter (Signed)
Called patient to discuss medication changes with the patient and his wife. Patient is agreeable to medication change. 30 day supply sent to local pharmacy, but if going to stay on medication long term patient would like it sent to the [facility] on file. He is going to check his pressure for 5 days on new medications and send to the office. He is also agreeable to repeat labs in 7-10 days. Lab slips to be placed in the mail!     "I would like for him to stop losartan and start valsartan 160 mg daily. Continue to monitor BP starting 5 days after making the change and call us to report. He will need to have a bmet  7-10 days after making the change. Continue to work on decreasing sodium in the diet. "

## 2021-12-30 NOTE — Therapy (Signed)
OUTPATIENT PHYSICAL THERAPY TREATMENT NOTE   Patient Name: [name] MRN: [id number] DOB:1946-09-24, 76 y.o., male Today's Date: 12/30/2021  PCP: [name], [name] G, MD REFERRING PROVIDER: [name], [name] G, MD   PT End of Session - 12/30/21 1305     Visit Number 14    Number of Visits 22    Date for PT Re-Evaluation 01/30/22    Authorization Type [employer]: progress note on visit 92    PT Start Time 1300    PT Stop Time 1340    PT Time Calculation (min) 40 min    Activity Tolerance Patient tolerated treatment well    Behavior During Therapy WFL for tasks assessed/performed               Past Medical History:  Diagnosis Date   A-fib ([place]) 03/04/2021   Anemia    Arthritis    Cancer ([place])    prostate   Chronic kidney disease    blood in urine    Constipation    Coronary artery disease    Depression    Diabetes mellitus without complication ([place])    Difficult intubation    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 it was hard to get the tube down his throat   Dyspnea    Family history of breast cancer    Fatty liver    Fatty liver    Frequent headaches    GERD (gastroesophageal reflux disease)    History of chicken pox    History of fainting spells of unknown cause    History of prostate cancer    Hyperlipidemia    Hypertension    Myocardial infarction [facility])    Peripheral neuropathy    Pneumonia    Prostate cancer ([place])    PTSD (post-traumatic stress disorder)    Sleep apnea    uses Cpap   Past Surgical History:  Procedure Laterality Date   APPENDECTOMY     BIOPSY  01/15/2021   Procedure: BIOPSY;  Surgeon: Irving Copas., MD;  Location: [place];  Service: Gastroenterology;;   CARDIAC CATHETERIZATION  06/26/2017   CARDIAC SURGERY     Triple Bypass   CHOLECYSTECTOMY  2010   COLONOSCOPY     CORONARY ARTERY BYPASS GRAFT  2004   ESOPHAGOGASTRODUODENOSCOPY (EGD) WITH PROPOFOL N/A 01/15/2021   Procedure: ESOPHAGOGASTRODUODENOSCOPY (EGD) WITH PROPOFOL;   Surgeon: Irving Copas., MD;  Location: [facility] ENDOSCOPY;  Service: Gastroenterology;  Laterality: N/A;   EUS N/A 01/15/2021   Procedure: UPPER ENDOSCOPIC ULTRASOUND (EUS) RADIAL;  Surgeon: Irving Copas., MD;  Location: [place];  Service: Gastroenterology;  Laterality: N/A;   EYE SURGERY Bilateral    cataract removal   FINE NEEDLE ASPIRATION  01/15/2021   Procedure: FINE NEEDLE ASPIRATION (FNA) LINEAR;  Surgeon: Irving Copas., MD;  Location: [facility] ENDOSCOPY;  Service: Gastroenterology;;   LAPAROSCOPY N/A 07/01/2021   Procedure: STAGING LAPAROSCOPY;  Surgeon: Dwan Bolt, MD;  Location: [place];  Service: General;  Laterality: N/A;   PORTACATH PLACEMENT Right 02/21/2021   Procedure: INSERTION PORT-A-CATH;  Surgeon: Dwan Bolt, MD;  Location: [place];  Service: General;  Laterality: Right;   SPLENECTOMY, 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   Procedure: SPLENECTOMY;  Surgeon: Dwan Bolt, MD;  Location: [place];  Service: General;  Laterality: N/A;   TONSILLECTOMY  1958   Patient Active Problem List   Diagnosis Date Noted   Foot callus 10/21/2021   Cellulitis 09/22/2021   Gastritis, 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   Malnutrition of moderate degree  07/02/2021   Pancreatic cancer ([place]) 07/01/2021   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   Coronary artery disease of bypass graft of native heart with stable angina pectoris ([place]) 06/04/2021   Genetic testing 04/04/2021   History of prostate cancer    Family history of breast cancer    Port-A-Cath in place 03/14/2021   Atrial fibrillation ([place])    Syncope and collapse 03/04/2021   Primary cancer of body of pancreas ([place]) 01/31/2021   Chest pain of uncertain etiology 78/24/2353   Elevated lipase 10/17/2020   H/O agent Orange exposure 10/17/2020   Dyslipidemia, goal LDL below 70 10/17/2020   Hx of CABG 02/28/2020   DM (diabetes mellitus), type 2 with renal complications ([place]) 61/44/3154   Hypertension with heart disease 04/01/2019    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[place]) 04/01/2019   CKD (chronic kidney disease), stage III ([place]) 04/01/2019   Peripheral neuropathy 04/01/2019   Insomnia 11/30/2018  PCP: [name], [name] G, MD   REFERRING PROVIDER: [name], [name] G, MD   REFERRING DIAG: Frequent falls    THERAPY DIAG:  Frequent falls    ONSET DATE: Cancer diagnosis February   SUBJECTIVE:    SUBJECTIVE STATEMENT: Patient feels like he worked out last time but he had no isgnificant soreness. He has not had any falls over the past few days.  PERTINENT HISTORY: Pancreatic cancer; arthritis and debility in both shoulders; dyspnea with exertion; heart attack; blood pressure fluctuation; spleen removal      PAIN:  Left knee is sore    PRECAUTIONS: Fall   WEIGHT BEARING RESTRICTIONS No   FALLS:  Has patient fallen in last 6 months? Yes, Number of falls: 3 major falls several small losses of balance The last time he fell he slid off the edge of the bed and couldn't hold himself; A few months ago he fell in the bathroom    LIVING ENVIRONMENT: Grab bars in the house; has steps in th house, but the patient dosen;'t have to go up them    OCCUPATION: retried   Works on computers   PLOF: Independent with household mobility with device   PATIENT GOALS    To improve strength and balance     Objective   TODAY'S TREATMENT: 1/16 Nu-step 6 min L3  Narrow base of support 2x20 sec eyes open CGA  Narrow base of support 2x20 sec eyes closed CGA  Tandem Stance: 2x20 sec hold each leg   Hip abduction machine 35 x10 45x10; 50lbs x10   1/11 Nu-step 6 min L3  Row 3x10 10 lbs first set next 2 15 lbs  Shoulder extension 3x10 10 lbs   Supine ball squeeze x30  Supine hip abduction x30  Supine march 2x15 each each leg.     1/9 reviewed use of gym equipment   Knee extension 20 lbs 3x10  Row 3x10 25 lbs  Hip abduction 3x10 30 lbs   Seated bilateral er yellow 2x10  Horizontal abduction 2x10  yellow Biceps curls 3x10 1lb 2lbs was too muc   1/6 Seated march 2x10 2lb  Laq 2x10 2lb  Ball squeeze 20x  Clamshell x20   Row red x20  Shoulder extneison x20 red   Narrow base of support 20 sec eyes open CGA  Narrow base of support 20 sec eyes closed Min a   Tandem left and right 20 sec hold mod a   Standing 2x10 heel raise  Standing slow march 2x10  PATIENT EDUCATION:  Education details: reviewed test and mesureas as well as POC going forward  Person educated: Patient Education method: Explanation, Demonstration, Tactile cues, and Verbal cues Education comprehension: verbalized understanding, returned demonstration, verbal cues required, tactile cues required, and needs further education     HOME EXERCISE PROGRAM: Access Code: [email or web address] URL: [email or web address] Date: 10/25/2021 Prepared by: Carolyne Littles   Exercises Seated Knee Extension with Resistance - 1 x daily - 7 x weekly - 3 sets - 10 reps Seated Hip Abduction with Resistance - 1 x daily - 7 x weekly - 3 sets - 10 reps Seated March - 1 x daily - 7 x weekly - 3 sets - 10 reps     ASSESSMENT:   CLINICAL IMPRESSION: Patient performed his balance exercises first today. In previous visit's he was getting work out before his balance exercises. He did better on his second and third trials of his balance exercises. He tolerated his gym exercises well. We were able to advance his hip abduction exercises without much difficulty.   Objective impairments include Abnormal gait, decreased activity tolerance, decreased balance, decreased endurance, decreased mobility, difficulty walking, decreased ROM, decreased strength, impaired UE functional use, and pain. These impairments are limiting patient from cleaning, driving, meal prep, yard work, and shopping. Personal factors including Age, Fitness, and 1-2 comorbidities: significant arthritis in his shoulders and peripheral neuropathy  are also affecting  patient's functional outcome. Patient will benefit from skilled PT to address above impairments and improve overall function.   REHAB POTENTIAL: Good   CLINICAL DECISION MAKING: Evolving/moderate complexity more falls and declining mobility    EVALUATION COMPLEXITY: Moderate     GOALS: Goals reviewed with patient? Yes   SHORT TERM GOALS:   STG Name Target Date Goal status  1 Patient will increase gross LE strength by 5 lbs  Baseline:  11/22/2021 INITIAL  2 Patient will require min a with tandem stance with eyes closed Baseline:  11/22/2021 INITIAL  3 Patient will ambulate 300' with improved posture and hip flexion  Baseline: 11/22/2021 INITIAL  LONG TERM GOALS:    LTG Name Target Date Goal status  1 BERG balance goal to be established  Baseline: 12/20/2021 INITIAL  2 Patient wil have no falls for a 2 week time period in order to improve safety  Baseline: 12/20/2021 INITIAL  3 Patient will report improved ability to perfrom ADLs without assistance  Baseline: 12/20/2021 INITIAL  PLAN: PT FREQUENCY: 2x/week   PT DURATION: 8 weeks   PLANNED INTERVENTIONS: Therapeutic exercises, Therapeutic activity, Neuro Muscular re-education, Balance training, Gait training, Patient/Family education, Joint mobilization, Stair training, DME instructions, Aquatic Therapy, Electrical stimulation, Cryotherapy, Moist heat, Taping, and Manual therapy   PLAN FOR NEXT SESSION: Patient may need lift to get into the pool. In the pool work on gait, balance, and hip strengthening. On land develop his HEP for home including seated UE and LE exercises; assess tolerance to exercises         Carney Living PT DPT  12/30/2021, 1:43 PM

## 2021-12-30 NOTE — Telephone Encounter (Signed)
Thank you! I would like for him to stop losartan and start valsartan 160 mg daily. Continue to monitor BP starting 5 days after making the change and call us to report. He will need to have a bmet  7-10 days after making the change. Continue to work on decreasing sodium in the diet.

## 2022-01-03 NOTE — Progress Notes (Signed)
HPI: [name] is a 76 y.o. male, who is here with his wife today for follow up. He was last seen on 10/04/21.  Last HgA1C was 8.2 on 09/15/21. He is on Lantus 50-70 U daily according to BS, if < 80 he skips it. He has had some 60-70's. A few times he needed to drink orange juice to bring glucose up. BS's < 80 usually happens when he does not drinks milk with 2 small donuts around 9 pm.  He snacks on donuts,cookies,and chocolate.  Last HgA1C 8.2 in 09/2021.  Negative for polydipsia,polyuria, or polyphagia. + Peripheral neuropathy LE's.  Pancreatic cancer, since completed chemo he has glove like pattern numbness of hands. Negative for associated weakness.  Hypertension:  Medications:He is on Diovan 160 mg daily and Amlodipine 2.5 mg daily. HypoK+ on KLOR 20 meq daily. BP readings at home:Not checking. Side effects:None.  Negative for unusual or severe headache, visual changes, exertional chest pain, dyspnea, or worsening edema.  Lab Results  Component Value Date   CREATININE 1.14 10/29/2021   BUN 17 10/29/2021   NA 137 10/29/2021   K 4.3 10/29/2021   CL 107 10/29/2021   CO2 22 10/29/2021   Anemia: He is on iron supplementation. Lab Results  Component Value Date   WBC 8.8 11/26/2021   HGB 9.7 (L) 11/26/2021   HCT 30.8 (L) 11/26/2021   MCV 95.4 11/26/2021   PLT 409 (H) 11/26/2021   Unstable gait, he uses a walker. He has fallen x 2 since his last visit, at home. One when trying to ger in bed and the other one in the kitchen, tripped with rug. No serious injury.  Having PT 2 times per week. HLD on Atorvastatin 80 mg daily.  Lab Results  Component Value Date   CHOL 113 10/10/2020   HDL 34 (L) 10/10/2020   LDLCALC 55 10/10/2020   TRIG 166 (H) 10/10/2020   CHOLHDL 3.3 10/10/2020   Review of Systems  Constitutional:  Negative for activity change, appetite change and fever.  HENT:  Negative for nosebleeds and sore throat.   Respiratory:  Negative  for cough and wheezing.   Gastrointestinal:  Negative for abdominal pain, blood in stool, nausea and vomiting.  Genitourinary:  Negative for decreased urine volume, dysuria and hematuria.  Musculoskeletal:  Positive for arthralgias and gait problem.  Neurological:  Negative for syncope and facial asymmetry.  Rest see pertinent positives and negatives per HPI.  Current Outpatient Medications on File Prior to Visit  Medication Sig Dispense Refill   acetaminophen (TYLENOL) 325 MG tablet Take 2 tablets (650 mg total) by mouth every 6 (six) hours as needed for mild pain. (Patient taking differently: Take 650 mg by mouth every 6 (six) hours as needed for mild pain or fever.)     amLODipine (NORVASC) 2.5 MG tablet Take 1 tablet (2.5 mg total) by mouth daily. 90 tablet 3   apixaban (ELIQUIS) 5 MG TABS tablet Take 5 mg by mouth 2 (two) times daily.     atorvastatin (LIPITOR) 80 MG tablet Take 40 mg by mouth at bedtime.     calcium-vitamin D (OSCAL WITH D) 500-200 MG-UNIT tablet Take 1 tablet by mouth 2 (two) times daily.     clotrimazole-betamethasone (LOTRISONE) cream Apply 1 application topically daily as needed. 45 g 2   cycloSPORINE (RESTASIS) 0.05 % ophthalmic emulsion Place 1 drop into both eyes 2 (two) times daily as needed (dry eyes).     doxycycline (VIBRA-TABS) 100 MG tablet  Take 1 tablet (100 mg total) by mouth 2 (two) times daily. 20 tablet 0   ferrous sulfate 325 (65 FE) MG tablet Take 1 tablet (325 mg total) by mouth daily with breakfast. 90 tablet 2   FLUoxetine (PROZAC) 20 MG capsule Take 40 mg by mouth daily.     glucose blood (ONETOUCH VERIO) test strip Use to test blood sugars 1-2 times daily. 200 each 12   insulin glargine (LANTUS) 100 UNIT/ML injection Inject 0.3 mLs (30 Units total) into the skin daily. (Patient taking differently: Inject 50 Units into the skin every morning.) 10 mL 3   Multiple Vitamin (MULTI-VITAMINS) TABS Take 1 tablet by mouth daily.     nystatin powder Apply 1  application topically 3 (three) times daily. 15 g 2   pantoprazole (PROTONIX) 40 MG tablet Take 1 tablet (40 mg total) by mouth daily. 90 tablet 3   potassium chloride SA (KLOR-CON M) 20 MEQ tablet TAKE 1 TABLET BY MOUTH EVERY DAY 30 tablet 1   tamsulosin (FLOMAX) 0.4 MG CAPS capsule Take 0.8 mg by mouth at bedtime.     traZODone (DESYREL) 100 MG tablet Take 100 mg by mouth at bedtime.     vitamin B-12 (CYANOCOBALAMIN) 1000 MCG tablet Take 1,000 mcg by mouth daily.     No current facility-administered medications on file prior to visit.    Past Medical History:  Diagnosis Date   A-fib ([place]) 03/04/2021   Anemia    Arthritis    Cancer (HCC)    prostate   Chronic kidney disease    blood in urine    Constipation    Coronary artery disease    Depression    Diabetes mellitus without complication ([place])    Difficult intubation    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 it was hard to get the tube down his throat   Dyspnea    Family history of breast cancer    Fatty liver    Fatty liver    Frequent headaches    GERD (gastroesophageal reflux disease)    History of chicken pox    History of fainting spells of unknown cause    History of prostate cancer    Hyperlipidemia    Hypertension    Myocardial infarction ([place])    Peripheral neuropathy    Pneumonia    Prostate cancer (HCC)    PTSD (post-traumatic stress disorder)    Sleep apnea    uses Cpap   Allergies  Allergen Reactions   Furosemide Other (See Comments)    Unknown   Keflex [Cephalexin] Other (See Comments)    Hallucinations?   Lisinopril Other (See Comments)    Unknown   Terazosin Other (See Comments)    Unknown    Social History   Socioeconomic History   Marital status: Married    Spouse name: Not on file   Number of children: 2   Years of education: Not on file   Highest education level: Not on file  Occupational History   Occupation: retired  Tobacco Use   Smoking status: Former    Types: Cigarettes    Quit date:  12/14/1975    Years since quitting: 46.1   Smokeless tobacco: Never  Vaping Use   Vaping Use: Not on file  Substance and Sexual Activity   Alcohol use: Not Currently   Drug use: Never   Sexual activity: Not Currently  Other Topics Concern   Not on file  Social History Narrative   ** Merged  History Encounter **       Social Determinants of Health   Financial Resource Strain: Low Risk    Difficulty of Paying Living Expenses: Not hard at all  Food Insecurity: No Food Insecurity   Worried About Charity fundraiser in the Last Year: Never true   [place] in the Last Year: Never true  Transportation Needs: No Transportation Needs   Lack of Transportation (Medical): No   Lack of Transportation (Non-Medical): No  Physical Activity: Insufficiently Active   Days of Exercise per Week: 2 days   Minutes of Exercise per Session: 60 min  Stress: No Stress Concern Present   Feeling of Stress : Not at all  Social Connections: Moderately Isolated   Frequency of Communication with Friends and Family: More than three times a week   Frequency of Social Gatherings with Friends and Family: More than three times a week   Attends Religious Services: Never   Marine scientist or Organizations: No   Attends Archivist Meetings: Never   Marital Status: Married   Vitals:   01/06/22 1050  BP: 130/70  Pulse: 93  Resp: 16  SpO2: 97%   Body mass index is 33.63 kg/m.  Physical Exam Vitals and nursing note reviewed.  Constitutional:      General: He is not in acute distress.    Appearance: He is well-developed.  HENT:     Head: Normocephalic and atraumatic.     Mouth/Throat:     Mouth: Mucous membranes are moist.  Eyes:     Conjunctiva/sclera: Conjunctivae normal.  Cardiovascular:     Rate and Rhythm: Normal rate and regular rhythm.     Heart sounds: No murmur heard.    Comments: DP pulses present. Pulmonary:     Effort: Pulmonary effort is normal. No respiratory  distress.     Breath sounds: Normal breath sounds.  Abdominal:     Palpations: Abdomen is soft. There is no hepatomegaly or mass.     Tenderness: There is no abdominal tenderness.  Musculoskeletal:     Right lower leg: 1+ Pitting Edema present.     Left lower leg: 1+ Pitting Edema present.       Feet:  Lymphadenopathy:     Cervical: No cervical adenopathy.  Skin:    General: Skin is warm.     Findings: No erythema or rash.  Neurological:     Mental Status: He is alert and oriented to person, place, and time.     Cranial Nerves: No cranial nerve deficit.     Comments: Unstable gait assisted with a walker.  Psychiatric:     Comments: Well groomed, good eye contact.   Diabetic Foot Exam - Simple   Simple Foot Form Diabetic Foot exam was performed with the following findings: Yes 01/06/2022 11:54 AM  Visual Inspection See comments: Yes Sensation Testing See comments: Yes Pulse Check See comments: Yes Comments DP pulses present. Plantar callus right. Absent monofilament, bilateral.    ASSESSMENT AND PLAN:  [name] was seen today for follow-up.  Diagnoses and all orders for this visit: Orders Placed This Encounter  Procedures   Lipid panel   POC HgB A1c   Lab Results  Component Value Date   HGBA1C 6.9 01/06/2022   Type 2 diabetes mellitus with stage 3 chronic kidney disease, with long-term current use of insulin, unspecified whether stage 3a or 3b CKD ([place]) HgA1C at goal, went from 8.2 to 6.9. Recommend sticking  with same dose of Lantus daily, 50 U. He can increase dose by 5 U in a week if having 200's. He agrees with decreasing unhealthy snacks, he is willing to give up donuts at night. Annual eye exam, periodic dental and foot care recommended. Following with podiatrists. F/U in 5-6 months  Hypertension with heart disease BP adequately controlled. Continue Diovan 160 mg daily and Amlodipine 2.5 mg daily. Continue low salt diet.  Dyslipidemia, goal LDL below  70 He is not fasting today, having labs schedule and his oncologist's office, lipid panel order placed. Continue   Frequent falls Fall precautions discussed. Continue PT.  Chemotherapy-induced neuropathy (HCC) New numbness/tingling in both hands. Appropriate skin care discussed. Continue monitoring, may get better within the next few months after stopping chemo.  Anemia, unspecified type Iron def and chronic disease, recently completed chemotherapy. He has an appt tomorrow with oncologist and planning on having labs done. Continue iron supplementation.  I spent a total of 44 minutes in both face to face and non face to face activities for this visit on the date of this encounter. During this time history was obtained and documented, examination was performed, prior labs reviewed, and assessment/plan discussed.  Return in about 6 months (around 07/06/2022).  Carime Dinkel G. [name], MD  [facility]. [place] office.

## 2022-01-06 ENCOUNTER — Ambulatory Visit (INDEPENDENT_AMBULATORY_CARE_PROVIDER_SITE_OTHER): Payer: Medicare Other | Admitting: Family Medicine

## 2022-01-06 ENCOUNTER — Encounter: Payer: Self-pay | Admitting: Family Medicine

## 2022-01-06 VITALS — BP 130/70 | HR 93 | Resp 16 | Ht 71.0 in | Wt 241.1 lb

## 2022-01-06 DIAGNOSIS — E1122 Type 2 diabetes mellitus with diabetic chronic kidney disease: Secondary | ICD-10-CM

## 2022-01-06 DIAGNOSIS — G620 Drug-induced polyneuropathy: Secondary | ICD-10-CM

## 2022-01-06 DIAGNOSIS — I119 Hypertensive heart disease without heart failure: Secondary | ICD-10-CM | POA: Diagnosis not present

## 2022-01-06 DIAGNOSIS — Z794 Long term (current) use of insulin: Secondary | ICD-10-CM | POA: Diagnosis not present

## 2022-01-06 DIAGNOSIS — I2510 Atherosclerotic heart disease of native coronary artery without angina pectoris: Secondary | ICD-10-CM

## 2022-01-06 DIAGNOSIS — D649 Anemia, unspecified: Secondary | ICD-10-CM | POA: Diagnosis not present

## 2022-01-06 DIAGNOSIS — E785 Hyperlipidemia, unspecified: Secondary | ICD-10-CM

## 2022-01-06 DIAGNOSIS — N183 Chronic kidney disease, stage 3 unspecified: Secondary | ICD-10-CM | POA: Diagnosis not present

## 2022-01-06 DIAGNOSIS — R296 Repeated falls: Secondary | ICD-10-CM | POA: Diagnosis not present

## 2022-01-06 DIAGNOSIS — T451X5A Adverse effect of antineoplastic and immunosuppressive drugs, initial encounter: Secondary | ICD-10-CM

## 2022-01-06 LAB — POCT GLYCOSYLATED HEMOGLOBIN (HGB A1C): HbA1c, POC (controlled diabetic range): 6.9 % (ref 0.0–7.0)

## 2022-01-06 NOTE — Patient Instructions (Addendum)
A few things to remember from today's visit:   Type 2 diabetes mellitus with stage 3 chronic kidney disease, with long-term current use of insulin, unspecified whether stage 3a or 3b CKD ([place]) - Plan: POC HgB A1c  Hypertension with heart disease  Dyslipidemia, goal LDL below 70 - Plan: Lipid panel  Fall in home, initial encounter  If you need refills please call your pharmacy. Do not use My Chart to request refills or for acute issues that need immediate attention. Lipid panel to be added to  labs tomorrow. Lantus try to keep it 50 U.  Fall precautions. Continue PT.   Please be sure medication list is accurate. If a new problem present, please set up appointment sooner than planned today.

## 2022-01-07 ENCOUNTER — Inpatient Hospital Stay: Payer: Medicare Other | Attending: Nurse Practitioner

## 2022-01-07 ENCOUNTER — Other Ambulatory Visit: Payer: Self-pay

## 2022-01-07 ENCOUNTER — Inpatient Hospital Stay: Payer: Medicare Other

## 2022-01-07 ENCOUNTER — Inpatient Hospital Stay (HOSPITAL_BASED_OUTPATIENT_CLINIC_OR_DEPARTMENT_OTHER): Payer: Medicare Other | Admitting: Oncology

## 2022-01-07 VITALS — BP 164/80 | HR 71 | Temp 97.8°F | Resp 20 | Ht 71.0 in | Wt 239.2 lb

## 2022-01-07 DIAGNOSIS — K2960 Other gastritis without bleeding: Secondary | ICD-10-CM

## 2022-01-07 DIAGNOSIS — C251 Malignant neoplasm of body of pancreas: Secondary | ICD-10-CM

## 2022-01-07 DIAGNOSIS — D649 Anemia, unspecified: Secondary | ICD-10-CM | POA: Diagnosis not present

## 2022-01-07 DIAGNOSIS — E1140 Type 2 diabetes mellitus with diabetic neuropathy, unspecified: Secondary | ICD-10-CM | POA: Diagnosis not present

## 2022-01-07 DIAGNOSIS — E785 Hyperlipidemia, unspecified: Secondary | ICD-10-CM

## 2022-01-07 DIAGNOSIS — Z95828 Presence of other vascular implants and grafts: Secondary | ICD-10-CM

## 2022-01-07 LAB — CBC WITH DIFFERENTIAL (CANCER CENTER ONLY)
Abs Immature Granulocytes: 0.02 10*3/uL (ref 0.00–0.07)
Basophils Absolute: 0 10*3/uL (ref 0.0–0.1)
Basophils Relative: 0 %
Eosinophils Absolute: 0.1 10*3/uL (ref 0.0–0.5)
Eosinophils Relative: 1 %
HCT: 36.3 % — ABNORMAL LOW (ref 39.0–52.0)
Hemoglobin: 11.7 g/dL — ABNORMAL LOW (ref 13.0–17.0)
Immature Granulocytes: 0 %
Lymphocytes Relative: 34 %
Lymphs Abs: 3 10*3/uL (ref 0.7–4.0)
MCH: 31.4 pg (ref 26.0–34.0)
MCHC: 32.2 g/dL (ref 30.0–36.0)
MCV: 97.3 fL (ref 80.0–100.0)
Monocytes Absolute: 0.7 10*3/uL (ref 0.1–1.0)
Monocytes Relative: 8 %
Neutro Abs: 4.9 10*3/uL (ref 1.7–7.7)
Neutrophils Relative %: 57 %
Platelet Count: 272 10*3/uL (ref 150–400)
RBC: 3.73 MIL/uL — ABNORMAL LOW (ref 4.22–5.81)
RDW: 16.1 % — ABNORMAL HIGH (ref 11.5–15.5)
WBC Count: 8.7 10*3/uL (ref 4.0–10.5)
nRBC: 0 % (ref 0.0–0.2)

## 2022-01-07 LAB — CMP (CANCER CENTER ONLY)
ALT: 13 U/L (ref 0–44)
AST: 21 U/L (ref 15–41)
Albumin: 3.4 g/dL — ABNORMAL LOW (ref 3.5–5.0)
Alkaline Phosphatase: 99 U/L (ref 38–126)
Anion gap: 8 (ref 5–15)
BUN: 32 mg/dL — ABNORMAL HIGH (ref 8–23)
CO2: 27 mmol/L (ref 22–32)
Calcium: 9.5 mg/dL (ref 8.9–10.3)
Chloride: 103 mmol/L (ref 98–111)
Creatinine: 1.15 mg/dL (ref 0.61–1.24)
GFR, Estimated: >60 mL/min (ref >60)
Glucose, Bld: 100 mg/dL — ABNORMAL HIGH (ref 70–99)
Potassium: 4 mmol/L (ref 3.5–5.1)
Sodium: 138 mmol/L (ref 135–145)
Total Bilirubin: 0.6 mg/dL (ref 0.3–1.2)
Total Protein: 6.7 g/dL (ref 6.5–8.1)

## 2022-01-07 MED ORDER — SODIUM CHLORIDE 0.9% FLUSH
10.0000 mL | Freq: Once | INTRAVENOUS | Status: AC
  Administered 2022-01-07: 10:00:00 10 mL

## 2022-01-07 MED ORDER — HEPARIN SOD (PORK) LOCK FLUSH 100 UNIT/ML IV SOLN
500.0000 [IU] | Freq: Once | INTRAVENOUS | Status: AC
  Administered 2022-01-07: 10:00:00 500 [IU]

## 2022-01-07 NOTE — Patient Instructions (Signed)

## 2022-01-07 NOTE — Progress Notes (Signed)
[facility] OFFICE PROGRESS NOTE   Diagnosis: Pancreas cancer  INTERVAL HISTORY:   [name] returns as scheduled.  He reports increased neuropathy symptoms in the hands and feet.  The hands are numb.  This appears with fine motor activities.  Foot numbness interferes with walking.  He is participating in physical therapy.  No other complaint.  Objective:  Vital signs in last 24 hours:  Blood pressure (!) 164/80, pulse 71, temperature 97.8 F (36.6 C), temperature source Oral, resp. rate 20, height 5\' 11"  (1.803 m), weight 239 lb 3.2 oz (108.5 kg), SpO2 98 %.    Lymphatics: No cervical, supraclavicular, axillary, or inguinal nodes Resp: Lungs clear bilaterally Cardio: Regular rate and rhythm GI: No hepatosplenomegaly, nontender, no mass Vascular: No leg edema Neuro: Severe loss of vibratory sense at several of the right fingertips, moderate to mild loss of vibratory sense at left fingertips, diminished texture discrimination at the right greater than left hand    Portacath/PICC-without erythema  Lab Results:  Lab Results  Component Value Date   WBC 8.7 01/07/2022   HGB 11.7 (L) 01/07/2022   HCT 36.3 (L) 01/07/2022   MCV 97.3 01/07/2022   PLT 272 01/07/2022   NEUTROABS 4.9 01/07/2022    CMP  Lab Results  Component Value Date   NA 138 01/07/2022   K 4.0 01/07/2022   CL 103 01/07/2022   CO2 27 01/07/2022   GLUCOSE 100 (H) 01/07/2022   BUN 32 (H) 01/07/2022   CREATININE 1.15 01/07/2022   CALCIUM 9.5 01/07/2022   PROT 6.7 01/07/2022   ALBUMIN 3.4 (L) 01/07/2022   AST 21 01/07/2022   ALT 13 01/07/2022   ALKPHOS 99 01/07/2022   BILITOT 0.6 01/07/2022   GFRNONAA >60 01/07/2022   GFRAA 49 (L) 10/10/2020    Medications: I have reviewed the patient's current medications.   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  No pancreatic ductal dilatation identified.  No definite signs of metastatic disease in the chest, abdomen or pelvis. Cycle 1 gemcitabine/Abraxane 03/01/2021 Cycle 2 gemcitabine/Abraxane 03/14/2021 Cycle 3 gemcitabine/Abraxane 03/28/2021 Cycle 4 gemcitabine/Abraxane 04/17/2021 Cycle 5 gemcitabine/Abraxane 05/01/2021, Zofran/Decadron added Cycle 6 gemcitabine/Abraxane 05/15/2021 CT pancreas protocol 05/27/2021-unchanged 8 mm exophytic low-attenuation lesion at the posterior body of the pancreas 07/01/2021-distal pancreatectomy/splenectomy, 0.8 cm poorly differentiated adenocarcinoma, treatment response score-2, largest single foci of remaining tumor 0.2 cm, negative resection margins, 0/6 lymph nodes,[id number], PanIN-1b Cycle 7 gemcitabine/Abraxane 08/14/2021 Cycle 8 gemcitabine/Abraxane 09/04/2021 Cycle 9 gemcitabine/Abraxane 09/18/2021 Cycle 10 gemcitabine/Abraxane 10/01/2021 Cycle 11 gemcitabine/Abraxane 10/16/2021 Cycle 12 gemcitabine/Abraxane 10/29/2021, Abraxane held secondary to neuropathy Diabetes Coronary artery disease Prostate cancer 2013-[facility] with radiation at [facility] Hypertension Sleep apnea 7.  Coronary artery bypass surgery 2004 8.  Hospital admission 03/04/2021-syncope, A. Fib-started on Eliquis 9.  Mild thrombocytopenia following cycle 1 gemcitabine/Abraxane-resolved 10.  Anemia 11.  Admission 09/22/2021 with a right metatarsal ulcer and right leg cellulitis 12.  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  He completed the final cycle of adjuvant chemotherapy 10/29/2021.  Anemia has improved.  He has developed progressive neuropathy symptoms.  This is likely secondary to Abraxane.  Hopefully the neuropathy will improve over the next few months.  He will keep the Port-A-Cath in place for now.  He will return for an office visit and Port-A-Cath flush in 6 weeks. He will continue physical therapy.  I cautioned him to be careful and have something to hold onto when ambulating and going up/down stairs.  Betsy Coder, MD  01/07/2022  10:59 AM

## 2022-01-08 ENCOUNTER — Encounter (HOSPITAL_BASED_OUTPATIENT_CLINIC_OR_DEPARTMENT_OTHER): Payer: Self-pay | Admitting: Physical Therapy

## 2022-01-08 ENCOUNTER — Ambulatory Visit (HOSPITAL_BASED_OUTPATIENT_CLINIC_OR_DEPARTMENT_OTHER): Payer: Medicare Other | Admitting: Physical Therapy

## 2022-01-08 DIAGNOSIS — R296 Repeated falls: Secondary | ICD-10-CM

## 2022-01-08 DIAGNOSIS — R2689 Other abnormalities of gait and mobility: Secondary | ICD-10-CM | POA: Diagnosis not present

## 2022-01-08 DIAGNOSIS — M6281 Muscle weakness (generalized): Secondary | ICD-10-CM | POA: Diagnosis not present

## 2022-01-08 LAB — CANCER ANTIGEN 19-9: CA 19-9: 31 U/mL (ref 0–35)

## 2022-01-08 NOTE — Therapy (Signed)
OUTPATIENT PHYSICAL THERAPY TREATMENT NOTE   Patient Name: [name] MRN: [id number] DOB:[birth year], 76 y.o., male Today's Date: 01/08/2022  PCP: [name], [name] G, MD REFERRING PROVIDER: [name], [name] G, MD   PT End of Session - 01/08/22 1028     Visit Number 15    Number of Visits 22    Date for PT Re-Evaluation 01/30/22    Authorization Type [employer]: progress note on visit 43    PT Start Time 1015    PT Stop Time 1058    PT Time Calculation (min) 43 min    Activity Tolerance Patient tolerated treatment well    Behavior During Therapy WFL for tasks assessed/performed               Past Medical History:  Diagnosis Date   A-fib ([place]) 03/04/2021   Anemia    Arthritis    Cancer ([place])    prostate   Chronic kidney disease    blood in urine    Constipation    Coronary artery disease    Depression    Diabetes mellitus without complication ([place])    Difficult intubation    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 it was hard to get the tube down his throat   Dyspnea    Family history of breast cancer    Fatty liver    Fatty liver    Frequent headaches    GERD (gastroesophageal reflux disease)    History of chicken pox    History of fainting spells of unknown cause    History of prostate cancer    Hyperlipidemia    Hypertension    Myocardial infarction [facility])    Peripheral neuropathy    Pneumonia    Prostate cancer ([place])    PTSD (post-traumatic stress disorder)    Sleep apnea    uses Cpap   Past Surgical History:  Procedure Laterality Date   APPENDECTOMY     BIOPSY  01/15/2021   Procedure: BIOPSY;  Surgeon: Irving Copas., MD;  Location: [place];  Service: Gastroenterology;;   CARDIAC CATHETERIZATION  06/26/2017   CARDIAC SURGERY     Triple Bypass   CHOLECYSTECTOMY  2010   COLONOSCOPY     CORONARY ARTERY BYPASS GRAFT  2004   ESOPHAGOGASTRODUODENOSCOPY (EGD) WITH PROPOFOL N/A 01/15/2021   Procedure: ESOPHAGOGASTRODUODENOSCOPY (EGD) WITH PROPOFOL;   Surgeon: Irving Copas., MD;  Location: [facility] ENDOSCOPY;  Service: Gastroenterology;  Laterality: N/A;   EUS N/A 01/15/2021   Procedure: UPPER ENDOSCOPIC ULTRASOUND (EUS) RADIAL;  Surgeon: Irving Copas., MD;  Location: [place];  Service: Gastroenterology;  Laterality: N/A;   EYE SURGERY Bilateral    cataract removal   FINE NEEDLE ASPIRATION  01/15/2021   Procedure: FINE NEEDLE ASPIRATION (FNA) LINEAR;  Surgeon: Irving Copas., MD;  [facility] ENDOSCOPY;  Service: Gastroenterology;;   LAPAROSCOPY N/A 07/01/2021   Procedure: STAGING LAPAROSCOPY;  Surgeon: Dwan Bolt, MD;  Location: [place];  Service: General;  Laterality: N/A;   PORTACATH PLACEMENT Right 02/21/2021   Procedure: INSERTION PORT-A-CATH;  Surgeon: Dwan Bolt, MD;  Location: [place];  Service: General;  Laterality: Right;   SPLENECTOMY, 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   Procedure: SPLENECTOMY;  Surgeon: Dwan Bolt, MD;  Location: [place];  Service: General;  Laterality: N/A;   TONSILLECTOMY  1958   Patient Active Problem List   Diagnosis Date Noted   Foot callus 10/21/2021   Cellulitis 09/22/2021   Gastritis, 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   Malnutrition of moderate degree  07/02/2021   Pancreatic cancer ([place]) 07/01/2021   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   Coronary artery disease of bypass graft of native heart with stable angina pectoris ([place]) 06/04/2021   Genetic testing 04/04/2021   History of prostate cancer    Family history of breast cancer    Port-A-Cath in place 03/14/2021   Atrial fibrillation ([place])    Syncope and collapse 03/04/2021   Primary cancer of body of pancreas ([place]) 01/31/2021   Chest pain of uncertain etiology 30/86/5784   Elevated lipase 10/17/2020   H/O agent Orange exposure 10/17/2020   Dyslipidemia, goal LDL below 70 10/17/2020   Hx of CABG 02/28/2020   DM (diabetes mellitus), type 2 with renal complications ([place]) 69/62/9528   Hypertension with heart disease 04/01/2019    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[place]) 04/01/2019   CKD (chronic kidney disease), stage III ([place]) 04/01/2019   Peripheral neuropathy 04/01/2019   Insomnia 11/30/2018  PCP: [name], [name] G, MD   REFERRING PROVIDER: [name], [name] G, MD   REFERRING DIAG: Frequent falls    THERAPY DIAG:  Frequent falls    ONSET DATE: Cancer diagnosis February   SUBJECTIVE:    SUBJECTIVE STATEMENT: Patient reports his blood sugar was 59 this morning . He ate a lot of sugary foods but did not re-check. He reports no symptoms.  PERTINENT HISTORY: Pancreatic cancer; arthritis and debility in both shoulders; dyspnea with exertion; heart attack; blood pressure fluctuation; spleen removal      PAIN:  Left knee is sore    PRECAUTIONS: Fall   WEIGHT BEARING RESTRICTIONS No   FALLS:  Has patient fallen in last 6 months? Yes, Number of falls: 3 major falls several small losses of balance The last time he fell he slid off the edge of the bed and couldn't hold himself; A few months ago he fell in the bathroom    LIVING ENVIRONMENT: Grab bars in the house; has steps in th house, but the patient dosen;'t have to go up them    OCCUPATION: retried   Works on computers   PLOF: Independent with household mobility with device   PATIENT GOALS    To improve strength and balance     Objective   TODAY'S TREATMENT: 1/25 Supine ball squeeze x30  Supine hip abduction x30 green  Supine march 2x15 each each leg.   Seated LAQ 3x15 green band   Narrow base of support 2x20 sec eyes open CGA  Narrow base of support 2x20 sec eyes closed CGA  Tandem Stance: 2x20 sec hold each leg   Rocking back and fourth min a 2x10  Marching with min UE support 2x10 with min a of the therapist   Nu-step 6 min L3   1/16 Nu-step 6 min L3  Narrow base of support 2x20 sec eyes open CGA  Narrow base of support 2x20 sec eyes closed CGA  Tandem Stance: 2x20 sec hold each leg   Hip abduction machine 35 x10 45x10;  50lbs x10   1/11 Nu-step 6 min L3  Row 3x10 10 lbs first set next 2 15 lbs  Shoulder extension 3x10 10 lbs   Supine ball squeeze x30  Supine hip abduction x30  Supine march 2x15 each each leg.     1/9 reviewed use of gym equipment   Knee extension 20 lbs 3x10  Row 3x10 25 lbs  Hip abduction 3x10 30 lbs   Seated bilateral er yellow 2x10  Horizontal abduction 2x10 yellow Biceps curls 3x10 1lb 2lbs  was too much   1/6 Seated march 2x10 2lb  Laq 2x10 2lb  Ball squeeze 20x  Clamshell x20   Row red x20  Shoulder extneison x20 red   Narrow base of support 20 sec eyes open CGA  Narrow base of support 20 sec eyes closed Min a   Tandem left and right 20 sec hold mod a   Standing 2x10 heel raise  Standing slow march 2x10          PATIENT EDUCATION:  Education details: reviewed test and mesureas as well as POC going forward  Person educated: Patient Education method: Explanation, Demonstration, Tactile cues, and Verbal cues Education comprehension: verbalized understanding, returned demonstration, verbal cues required, tactile cues required, and needs further education     HOME EXERCISE PROGRAM: Access Code: [email or web address] URL: [email or web address] Date: 10/25/2021 Prepared by: Carolyne Littles   Exercises Seated Knee Extension with Resistance - 1 x daily - 7 x weekly - 3 sets - 10 reps Seated Hip Abduction with Resistance - 1 x daily - 7 x weekly - 3 sets - 10 reps Seated March - 1 x daily - 7 x weekly - 3 sets - 10 reps     ASSESSMENT:   CLINICAL IMPRESSION: The patient has joined the gym. He is interested in gym exercises. He was kept limited because of his blood sugar. Therapy kept light resistance and close monitoring of symptoms. He had no syncope or signs of low blood sugar. Te patient was advised if it is low to re-check if he is going to the gym or to therapy. He was advised of the dangers of low blood sugar. Overall he did well with his balance  exercises. He did require some assist with rocking and marching   Objective impairments include Abnormal gait, decreased activity tolerance, decreased balance, decreased endurance, decreased mobility, difficulty walking, decreased ROM, decreased strength, impaired UE functional use, and pain. These impairments are limiting patient from cleaning, driving, meal prep, yard work, and shopping. Personal factors including Age, Fitness, and 1-2 comorbidities: significant arthritis in his shoulders and peripheral neuropathy  are also affecting patient's functional outcome. Patient will benefit from skilled PT to address above impairments and improve overall function.   REHAB POTENTIAL: Good   CLINICAL DECISION MAKING: Evolving/moderate complexity more falls and declining mobility    EVALUATION COMPLEXITY: Moderate     GOALS: Goals reviewed with patient? Yes   SHORT TERM GOALS:   STG Name Target Date Goal status  1 Patient will increase gross LE strength by 5 lbs  Baseline:  11/22/2021 INITIAL  2 Patient will require min a with tandem stance with eyes closed Baseline:  11/22/2021 INITIAL  3 Patient will ambulate 300' with improved posture and hip flexion  Baseline: 11/22/2021 INITIAL  LONG TERM GOALS:    LTG Name Target Date Goal status  1 BERG balance goal to be established  Baseline: 12/20/2021 INITIAL  2 Patient wil have no falls for a 2 week time period in order to improve safety  Baseline: 12/20/2021 INITIAL  3 Patient will report improved ability to perfrom ADLs without assistance  Baseline: 12/20/2021 INITIAL  PLAN: PT FREQUENCY: 2x/week   PT DURATION: 8 weeks   PLANNED INTERVENTIONS: Therapeutic exercises, Therapeutic activity, Neuro Muscular re-education, Balance training, Gait training, Patient/Family education, Joint mobilization, Stair training, DME instructions, Aquatic Therapy, Electrical stimulation, Cryotherapy, Moist heat, Taping, and Manual therapy   PLAN FOR NEXT SESSION:  Patient may need lift to get into the pool. In the  pool work on gait, balance, and hip strengthening. On land develop his HEP for home including seated UE and LE exercises; assess tolerance to exercises         Carney Living PT DPT  01/08/2022, 10:31 AM

## 2022-01-10 ENCOUNTER — Encounter (HOSPITAL_BASED_OUTPATIENT_CLINIC_OR_DEPARTMENT_OTHER): Payer: Self-pay | Admitting: Physical Therapy

## 2022-01-10 ENCOUNTER — Ambulatory Visit (HOSPITAL_BASED_OUTPATIENT_CLINIC_OR_DEPARTMENT_OTHER): Payer: Medicare Other | Admitting: Physical Therapy

## 2022-01-10 ENCOUNTER — Telehealth (HOSPITAL_BASED_OUTPATIENT_CLINIC_OR_DEPARTMENT_OTHER): Payer: Self-pay | Admitting: Nurse Practitioner

## 2022-01-10 ENCOUNTER — Other Ambulatory Visit: Payer: Self-pay

## 2022-01-10 DIAGNOSIS — M6281 Muscle weakness (generalized): Secondary | ICD-10-CM | POA: Diagnosis not present

## 2022-01-10 DIAGNOSIS — R296 Repeated falls: Secondary | ICD-10-CM

## 2022-01-10 DIAGNOSIS — R2689 Other abnormalities of gait and mobility: Secondary | ICD-10-CM | POA: Diagnosis not present

## 2022-01-10 DIAGNOSIS — I10 Essential (primary) hypertension: Secondary | ICD-10-CM

## 2022-01-10 MED ORDER — VALSARTAN 320 MG PO TABS: 320.0000 mg | ORAL_TABLET | Freq: Every day | ORAL | 11 refills | Status: DC

## 2022-01-10 NOTE — Telephone Encounter (Signed)
Received BP readings that patient dropped off at the office. The readings are consistently taken between 1000 and 1300 each day and are consistently > 160/68 mmHg. HR is 68-83. I called and left him a voice mail to increase his valsartan to 320 mg daily and continue to monitor BP on this increased dose. I asked him to come in for a basic metabolic panel in 1 week to closely monitor kidney function. I advised that he could call me at the office on Monday if he has questions or concerns.

## 2022-01-10 NOTE — Therapy (Signed)
OUTPATIENT PHYSICAL THERAPY TREATMENT NOTE   Patient Name: Court Gracia MRN: [id number] DOB:06-Mar-1946, 76 y.o., male Today's Date: 01/10/2022  PCP: [name], [name] G, MD REFERRING PROVIDER: [name], [name] G, MD   PT End of Session - 01/10/22 1304     Visit Number 16    Number of Visits 22    Date for PT Re-Evaluation 01/30/22    Authorization Type [employer]: progress note on visit 73    PT Start Time 1300    PT Stop Time 1341    PT Time Calculation (min) 41 min    Activity Tolerance Patient tolerated treatment well    Behavior During Therapy [facility] for tasks assessed/performed               Past Medical History:  Diagnosis Date   A-fib ([place]) 03/04/2021   Anemia    Arthritis    Cancer ([place])    prostate   Chronic kidney disease    blood in urine    Constipation    Coronary artery disease    Depression    Diabetes mellitus without complication ([place])    Difficult intubation    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 it was hard to get the tube down his throat   Dyspnea    Family history of breast cancer    Fatty liver    Fatty liver    Frequent headaches    GERD (gastroesophageal reflux disease)    History of chicken pox    History of fainting spells of unknown cause    History of prostate cancer    Hyperlipidemia    Hypertension    Myocardial infarction [facility])    Peripheral neuropathy    Pneumonia    Prostate cancer ([place])    PTSD (post-traumatic stress disorder)    Sleep apnea    uses Cpap   Past Surgical History:  Procedure Laterality Date   APPENDECTOMY     BIOPSY  01/15/2021   Procedure: BIOPSY;  Surgeon: Irving Copas., MD;  Location: [place];  Service: Gastroenterology;;   CARDIAC CATHETERIZATION  06/26/2017   CARDIAC SURGERY     Triple Bypass   CHOLECYSTECTOMY  2010   COLONOSCOPY     CORONARY ARTERY BYPASS GRAFT  2004   ESOPHAGOGASTRODUODENOSCOPY (EGD) WITH PROPOFOL N/A 01/15/2021   Procedure: ESOPHAGOGASTRODUODENOSCOPY (EGD) WITH PROPOFOL;   Surgeon: Irving Copas., MD;  Location: [facility] ENDOSCOPY;  Service: Gastroenterology;  Laterality: N/A;   EUS N/A 01/15/2021   Procedure: UPPER ENDOSCOPIC ULTRASOUND (EUS) RADIAL;  Surgeon: Irving Copas., MD;  Location: [place];  Service: Gastroenterology;  Laterality: N/A;   EYE SURGERY Bilateral    cataract removal   FINE NEEDLE ASPIRATION  01/15/2021   Procedure: FINE NEEDLE ASPIRATION (FNA) LINEAR;  Surgeon: Irving Copas., MD;  Location: [facility] ENDOSCOPY;  Service: Gastroenterology;;   LAPAROSCOPY N/A 07/01/2021   Procedure: STAGING LAPAROSCOPY;  Surgeon: Dwan Bolt, MD;  Location: [place];  Service: General;  Laterality: N/A;   PORTACATH PLACEMENT Right 02/21/2021   Procedure: INSERTION PORT-A-CATH;  Surgeon: Dwan Bolt, MD;  Location: [place];  Service: General;  Laterality: Right;   SPLENECTOMY, 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   Procedure: SPLENECTOMY;  Surgeon: Dwan Bolt, MD;  Location: [place];  Service: General;  Laterality: N/A;   TONSILLECTOMY  1958   Patient Active Problem List   Diagnosis Date Noted   Foot callus 10/21/2021   Cellulitis 09/22/2021   Gastritis, 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   Malnutrition of moderate degree  07/02/2021   Pancreatic cancer ([place]) 07/01/2021   Pancreatic adenocarcinoma () 07/01/2021   Coronary artery disease of bypass graft of native heart with stable angina pectoris ([place]) 06/04/2021   Genetic testing 04/04/2021   History of prostate cancer    Family history of breast cancer    Port-A-Cath in place 03/14/2021   Atrial fibrillation ([place])    Syncope and collapse 03/04/2021   Primary cancer of body of pancreas ([place]) 01/31/2021   Chest pain of uncertain etiology 32/67/1245   Elevated lipase 10/17/2020   H/O agent Orange exposure 10/17/2020   Dyslipidemia, goal LDL below 70 10/17/2020   Hx of CABG 02/28/2020   DM (diabetes mellitus), type 2 with renal complications ([place]) 80/99/8338   Hypertension with heart disease 04/01/2019    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[place]) 04/01/2019   CKD (chronic kidney disease), stage III ([place]) 04/01/2019   Peripheral neuropathy 04/01/2019   Insomnia 11/30/2018  PCP: [name], [name] G, MD   REFERRING PROVIDER: [name], [name] G, MD   REFERRING DIAG: Frequent falls    THERAPY DIAG:  Frequent falls    ONSET DATE: Cancer diagnosis February   SUBJECTIVE:    SUBJECTIVE STATEMENT: Patient's blood sugar has been better. it was 111 this morning. He has been feeling pretty good.  PERTINENT HISTORY: Pancreatic cancer; arthritis and debility in both shoulders; dyspnea with exertion; heart attack; blood pressure fluctuation; spleen removal      PAIN:  No pain 1/27   PRECAUTIONS: Fall   WEIGHT BEARING RESTRICTIONS No   FALLS:  Has patient fallen in last 6 months? Yes, Number of falls: 3 major falls several small losses of balance The last time he fell he slid off the edge of the bed and couldn't hold himself; A few months ago he fell in the bathroom    LIVING ENVIRONMENT: Grab bars in the house; has steps in th house, but the patient dosen;'t have to go up them    OCCUPATION: retried   Works on computers   PLOF: Independent with household mobility with device   PATIENT GOALS    To improve strength and balance     Objective   TODAY'S TREATMENT: 1/27 Nu-step L4 5 min  Hip abduction 3x15 55 lbs  Knee extension 25 lbs 3x15   Row machine cybex 30 lbs  Leg press 3x15 55 lbs   1/25 Supine ball squeeze x30  Supine hip abduction x30 green  Supine march 2x15 each each leg.   Seated LAQ 3x15 green band   Narrow base of support 2x20 sec eyes open CGA  Narrow base of support 2x20 sec eyes closed CGA  Tandem Stance: 2x20 sec hold each leg   Rocking back and fourth min a 2x10  Marching with min UE support 2x10 with min a of the therapist   Nu-step 6 min L3   1/16 Nu-step 6 min L3  Narrow base of support 2x20 sec eyes open CGA  Narrow base of support 2x20  sec eyes closed CGA  Tandem Stance: 2x20 sec hold each leg   Hip abduction machine 35 x10 45x10; 50lbs x10   1/11 Nu-step 6 min L3  Row 3x10 10 lbs first set next 2 15 lbs  Shoulder extension 3x10 10 lbs   Supine ball squeeze x30  Supine hip abduction x30  Supine march 2x15 each each leg.           PATIENT EDUCATION:  Education details: reviewed test and mesureas as well as POC  going forward  Person educated: Patient Education method: Explanation, Demonstration, Tactile cues, and Verbal cues Education comprehension: verbalized understanding, returned demonstration, verbal cues required, tactile cues required, and needs further education     HOME EXERCISE PROGRAM: Access Code: [email or web address] URL: [email or web address] Date: 10/25/2021 Prepared by: Carolyne Littles   Exercises Seated Knee Extension with Resistance - 1 x daily - 7 x weekly - 3 sets - 10 reps Seated Hip Abduction with Resistance - 1 x daily - 7 x weekly - 3 sets - 10 reps Seated March - 1 x daily - 7 x weekly - 3 sets - 10 reps     ASSESSMENT:   CLINICAL IMPRESSION: The patient continues to tolerate treatment well. He was able to use the gym machines today. Therapy educated the patient on proper fit. We began discussing his transition to his home program which will likley be at the gym. He was advised to let us know when he is set up with his health and wellness's specialist for his intake. We will contnue over the next few visits to make him comfortable with the different equipment.   Objective impairments include Abnormal gait, decreased activity tolerance, decreased balance, decreased endurance, decreased mobility, difficulty walking, decreased ROM, decreased strength, impaired UE functional use, and pain. These impairments are limiting patient from cleaning, driving, meal prep, yard work, and shopping. Personal factors including Age, Fitness, and 1-2 comorbidities: significant arthritis in his  shoulders and peripheral neuropathy  are also affecting patient's functional outcome. Patient will benefit from skilled PT to address above impairments and improve overall function.   REHAB POTENTIAL: Good   CLINICAL DECISION MAKING: Evolving/moderate complexity more falls and declining mobility    EVALUATION COMPLEXITY: Moderate     GOALS: Goals reviewed with patient? Yes   SHORT TERM GOALS:   STG Name Target Date Goal status  1 Patient will increase gross LE strength by 5 lbs  Baseline:  11/22/2021 Patients strength has improved 1/27  2 Patient will require min a with tandem stance with eyes closed Baseline:  11/22/2021 Min a or less at this time  Achieved   3 Patient will ambulate 300' with improved posture and hip flexion  Baseline: 11/22/2021 Ambulating more straight up  Improved   LONG TERM GOALS:    LTG Name Target Date Goal status  1 BERG balance goal to be established  Baseline: 12/20/2021 INITIAL  2 Patient wil have no falls for a 2 week time period in order to improve safety  Baseline: 12/20/2021 INITIAL  3 Patient will report improved ability to perfrom ADLs without assistance  Baseline: 12/20/2021 INITIAL  PLAN: PT FREQUENCY: 2x/week   PT DURATION: 8 weeks   PLANNED INTERVENTIONS: Therapeutic exercises, Therapeutic activity, Neuro Muscular re-education, Balance training, Gait training, Patient/Family education, Joint mobilization, Stair training, DME instructions, Aquatic Therapy, Electrical stimulation, Cryotherapy, Moist heat, Taping, and Manual therapy   PLAN FOR NEXT SESSION: Patient may need lift to get into the pool. In the pool work on gait, balance, and hip strengthening. On land develop his HEP for home including seated UE and LE exercises; assess tolerance to exercises         Carney Living PT DPT  01/10/2022, 1:50 PM

## 2022-01-12 ENCOUNTER — Other Ambulatory Visit: Payer: Self-pay | Admitting: Oncology

## 2022-01-12 DIAGNOSIS — C250 Malignant neoplasm of head of pancreas: Secondary | ICD-10-CM

## 2022-01-13 ENCOUNTER — Encounter: Payer: Self-pay | Admitting: Oncology

## 2022-01-14 ENCOUNTER — Ambulatory Visit (HOSPITAL_BASED_OUTPATIENT_CLINIC_OR_DEPARTMENT_OTHER): Payer: Medicare Other | Admitting: Physical Therapy

## 2022-01-14 ENCOUNTER — Other Ambulatory Visit: Payer: Self-pay

## 2022-01-14 ENCOUNTER — Encounter (HOSPITAL_BASED_OUTPATIENT_CLINIC_OR_DEPARTMENT_OTHER): Payer: Self-pay | Admitting: Physical Therapy

## 2022-01-14 DIAGNOSIS — R296 Repeated falls: Secondary | ICD-10-CM | POA: Diagnosis not present

## 2022-01-14 DIAGNOSIS — M6281 Muscle weakness (generalized): Secondary | ICD-10-CM | POA: Diagnosis not present

## 2022-01-14 DIAGNOSIS — R2689 Other abnormalities of gait and mobility: Secondary | ICD-10-CM | POA: Diagnosis not present

## 2022-01-14 NOTE — Therapy (Signed)
OUTPATIENT PHYSICAL THERAPY TREATMENT NOTE   Patient Name: [name] MRN: [id number] DOB:Mar 19, 1946, 76 y.o., male Today's Date: 01/14/2022  PCP: [name], [name] G, MD REFERRING PROVIDER: [name], [name] G, MD   PT End of Session - 01/14/22 1108     Visit Number 17    Number of Visits 22    Date for PT Re-Evaluation 01/30/22    Authorization Type [employer]: progress note on visit 4    PT Start Time 1100    PT Stop Time 1140    PT Time Calculation (min) 40 min    Activity Tolerance Patient tolerated treatment well    Behavior During Therapy WFL for tasks assessed/performed               Past Medical History:  Diagnosis Date   A-fib ([place]) 03/04/2021   Anemia    Arthritis    Cancer ([place])    prostate   Chronic kidney disease    blood in urine    Constipation    Coronary artery disease    Depression    Diabetes mellitus without complication ([place])    Difficult intubation    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 it was hard to get the tube down his throat   Dyspnea    Family history of breast cancer    Fatty liver    Fatty liver    Frequent headaches    GERD (gastroesophageal reflux disease)    History of chicken pox    History of fainting spells of unknown cause    History of prostate cancer    Hyperlipidemia    Hypertension    Myocardial infarction [facility])    Peripheral neuropathy    Pneumonia    Prostate cancer ([place])    PTSD (post-traumatic stress disorder)    Sleep apnea    uses Cpap   Past Surgical History:  Procedure Laterality Date   APPENDECTOMY     BIOPSY  01/15/2021   Procedure: BIOPSY;  Surgeon: Irving Copas., MD;  Location: [place];  Service: Gastroenterology;;   CARDIAC CATHETERIZATION  06/26/2017   CARDIAC SURGERY     Triple Bypass   CHOLECYSTECTOMY  2010   COLONOSCOPY     CORONARY ARTERY BYPASS GRAFT  2004   ESOPHAGOGASTRODUODENOSCOPY (EGD) WITH PROPOFOL N/A 01/15/2021   Procedure: ESOPHAGOGASTRODUODENOSCOPY (EGD) WITH PROPOFOL;   Surgeon: Irving Copas., MD;  Location: [facility] ENDOSCOPY;  Service: Gastroenterology;  Laterality: N/A;   EUS N/A 01/15/2021   Procedure: UPPER ENDOSCOPIC ULTRASOUND (EUS) RADIAL;  Surgeon: Irving Copas., MD;  Location: [place];  Service: Gastroenterology;  Laterality: N/A;   EYE SURGERY Bilateral    cataract removal   FINE NEEDLE ASPIRATION  01/15/2021   Procedure: FINE NEEDLE ASPIRATION (FNA) LINEAR;  Surgeon: Irving Copas., MD;  Location: [facility] ENDOSCOPY;  Service: Gastroenterology;;   LAPAROSCOPY N/A 07/01/2021   Procedure: STAGING LAPAROSCOPY;  Surgeon: Dwan Bolt, MD;  Location: [place];  Service: General;  Laterality: N/A;   PORTACATH PLACEMENT Right 02/21/2021   Procedure: INSERTION PORT-A-CATH;  Surgeon: Dwan Bolt, MD;  Location: [place];  Service: General;  Laterality: Right;   SPLENECTOMY, 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   Procedure: SPLENECTOMY;  Surgeon: Dwan Bolt, MD;  Location: [place];  Service: General;  Laterality: N/A;   TONSILLECTOMY  1958   Patient Active Problem List   Diagnosis Date Noted   Foot callus 10/21/2021   Cellulitis 09/22/2021   Gastritis, 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   Malnutrition of moderate degree  07/02/2021   Pancreatic cancer ([place]) 07/01/2021   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   Coronary artery disease of bypass graft of native heart with stable angina pectoris ([place]) 06/04/2021   Genetic testing 04/04/2021   History of prostate cancer    Family history of breast cancer    Port-A-Cath in place 03/14/2021   Atrial fibrillation ([place])    Syncope and collapse 03/04/2021   Primary cancer of body of pancreas ([place]) 01/31/2021   Chest pain of uncertain etiology 20/25/4270   Elevated lipase 10/17/2020   H/O agent Orange exposure 10/17/2020   Dyslipidemia, goal LDL below 70 10/17/2020   Hx of CABG 02/28/2020   DM (diabetes mellitus), type 2 with renal complications ([place]) 62/37/6283   Hypertension with heart disease 04/01/2019    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[place]) 04/01/2019   CKD (chronic kidney disease), stage III ([place]) 04/01/2019   Peripheral neuropathy 04/01/2019   Insomnia 11/30/2018  PCP: [name], [name] G, MD   REFERRING PROVIDER: [name], [name] G, MD   REFERRING DIAG: Frequent falls    THERAPY DIAG:  Frequent falls    ONSET DATE: Cancer diagnosis February   SUBJECTIVE:    SUBJECTIVE STATEMENT: Patient's blood sugar has been better. it was 111 this morning. He has been feeling pretty good.  PERTINENT HISTORY: Pancreatic cancer; arthritis and debility in both shoulders; dyspnea with exertion; heart attack; blood pressure fluctuation; spleen removal      PAIN:  No pain 1/27   PRECAUTIONS: Fall   WEIGHT BEARING RESTRICTIONS No   FALLS:  Has patient fallen in last 6 months? Yes, Number of falls: 3 major falls several small losses of balance The last time he fell he slid off the edge of the bed and couldn't hold himself; A few months ago he fell in the bathroom    LIVING ENVIRONMENT: Grab bars in the house; has steps in th house, but the patient dosen;'t have to go up them    OCCUPATION: retried   Works on computers   PLOF: Independent with household mobility with device   PATIENT GOALS    To improve strength and balance     Objective   TODAY'S TREATMENT: 1/31 Nu step L4 6 min  Bilateral er 2x10 yellow  Shoulder flexion with abduction 3x10 yellow   LS triceps pushdown 3x10 25 lbs  Row 3x10 39 lbs    1/27 Nu-step L4 5 min  Hip abduction 3x15 55 lbs  Knee extension 25 lbs 3x15   Row machine cybex 30 lbs  Leg press 3x15 55 lbs   1/25 Supine ball squeeze x30  Supine hip abduction x30 green  Supine march 2x15 each each leg.   Seated LAQ 3x15 green band   Narrow base of support 2x20 sec eyes open CGA  Narrow base of support 2x20 sec eyes closed CGA  Tandem Stance: 2x20 sec hold each leg   Rocking back and fourth min a 2x10  Marching with min UE support 2x10  with min a of the therapist   Nu-step 6 min L3   1/16 Nu-step 6 min L3  Narrow base of support 2x20 sec eyes open CGA  Narrow base of support 2x20 sec eyes closed CGA  Tandem Stance: 2x20 sec hold each leg   Hip abduction machine 35 x10 45x10; 50lbs x10   1/11 Nu-step 6 min L3  Row 3x10 10 lbs first set next 2 15 lbs  Shoulder extension 3x10 10 lbs   Supine ball squeeze x30  Supine hip abduction x30  Supine march 2x15 each each leg.           PATIENT EDUCATION:  Education details: reviewed test and mesureas as well as POC going forward  Person educated: Patient Education method: Explanation, Demonstration, Tactile cues, and Verbal cues Education comprehension: verbalized understanding, returned demonstration, verbal cues required, tactile cues required, and needs further education     HOME EXERCISE PROGRAM: Access Code: [email or web address] URL: [email or web address] Date: 10/25/2021 Prepared by: Carolyne Littles   Exercises Seated Knee Extension with Resistance - 1 x daily - 7 x weekly - 3 sets - 10 reps Seated Hip Abduction with Resistance - 1 x daily - 7 x weekly - 3 sets - 10 reps Seated March - 1 x daily - 7 x weekly - 3 sets - 10 reps     ASSESSMENT:   CLINICAL IMPRESSION: The patient continues to tolerate treatment well. We focused more on upper body machines today. He tolerated well. He was fatigued with exercises but had no significant pain. He only has 1 more visit scheduled. He was advised to schedule out 1x a week for 4 more weeks. He will transition into the gy at that time. MIld balance deficits noted with his standing shoulder exercises   Objective impairments include Abnormal gait, decreased activity tolerance, decreased balance, decreased endurance, decreased mobility, difficulty walking, decreased ROM, decreased strength, impaired UE functional use, and pain. These impairments are limiting patient from cleaning, driving, meal prep, yard work, and  shopping. Personal factors including Age, Fitness, and 1-2 comorbidities: significant arthritis in his shoulders and peripheral neuropathy  are also affecting patient's functional outcome. Patient will benefit from skilled PT to address above impairments and improve overall function.   REHAB POTENTIAL: Good   CLINICAL DECISION MAKING: Evolving/moderate complexity more falls and declining mobility    EVALUATION COMPLEXITY: Moderate     GOALS: Goals reviewed with patient? Yes   SHORT TERM GOALS:   STG Name Target Date Goal status  1 Patient will increase gross LE strength by 5 lbs  Baseline:  11/22/2021 Patients strength has improved 1/27  2 Patient will require min a with tandem stance with eyes closed Baseline:  11/22/2021 Min a or less at this time  Achieved   3 Patient will ambulate 300' with improved posture and hip flexion  Baseline: 11/22/2021 Ambulating more straight up  Improved   LONG TERM GOALS:    LTG Name Target Date Goal status  1 BERG balance goal to be established  Baseline: 12/20/2021 INITIAL  2 Patient wil have no falls for a 2 week time period in order to improve safety  Baseline: 12/20/2021 INITIAL  3 Patient will report improved ability to perfrom ADLs without assistance  Baseline: 12/20/2021 INITIAL  PLAN: PT FREQUENCY: 2x/week   PT DURATION: 8 weeks   PLANNED INTERVENTIONS: Therapeutic exercises, Therapeutic activity, Neuro Muscular re-education, Balance training, Gait training, Patient/Family education, Joint mobilization, Stair training, DME instructions, Aquatic Therapy, Electrical stimulation, Cryotherapy, Moist heat, Taping, and Manual therapy   PLAN FOR NEXT SESSION: Patient may need lift to get into the pool. In the pool work on gait, balance, and hip strengthening. On land develop his HEP for home including seated UE and LE exercises; assess tolerance to exercises         Carney Living PT DPT  01/14/2022, 11:42 AM

## 2022-01-16 ENCOUNTER — Telehealth (HOSPITAL_BASED_OUTPATIENT_CLINIC_OR_DEPARTMENT_OTHER): Payer: Self-pay | Admitting: Family

## 2022-01-16 DIAGNOSIS — I10 Essential (primary) hypertension: Secondary | ICD-10-CM | POA: Diagnosis not present

## 2022-01-16 MED ORDER — AMLODIPINE BESYLATE 5 MG PO TABS: 5.0000 mg | ORAL_TABLET | Freq: Every day | ORAL | 1 refills | Status: DC

## 2022-01-16 NOTE — Telephone Encounter (Signed)
Patient dropped off his BP log from 1/28-2/2 with BP ranging 172/82-182/80. Checking around 1pm. Takes his BP medications between 9am and 11am.   Pulled patient back to do a manual BP which was 158/80. He will bring his BP cuff to next visit. Discussed with Christen Bame, NP. Amlodipine increased to 5mg  QD. Rx sent to pharmacy. Follow up scheduled for 01/29/22 to reassess BP.   Loel Dubonnet, NP

## 2022-01-17 ENCOUNTER — Ambulatory Visit (HOSPITAL_BASED_OUTPATIENT_CLINIC_OR_DEPARTMENT_OTHER): Payer: Medicare Other | Attending: Family Medicine | Admitting: Physical Therapy

## 2022-01-17 ENCOUNTER — Encounter: Payer: Self-pay | Admitting: Oncology

## 2022-01-17 ENCOUNTER — Telehealth (HOSPITAL_BASED_OUTPATIENT_CLINIC_OR_DEPARTMENT_OTHER): Payer: Self-pay

## 2022-01-17 ENCOUNTER — Other Ambulatory Visit: Payer: Self-pay

## 2022-01-17 DIAGNOSIS — R2689 Other abnormalities of gait and mobility: Secondary | ICD-10-CM

## 2022-01-17 DIAGNOSIS — R296 Repeated falls: Secondary | ICD-10-CM | POA: Diagnosis not present

## 2022-01-17 DIAGNOSIS — M6281 Muscle weakness (generalized): Secondary | ICD-10-CM | POA: Diagnosis not present

## 2022-01-17 LAB — BASIC METABOLIC PANEL
BUN/Creatinine Ratio: 27 — ABNORMAL HIGH (ref 10–24)
BUN: 31 mg/dL — ABNORMAL HIGH (ref 8–27)
CO2: 24 mmol/L (ref 20–29)
Calcium: 9.7 mg/dL (ref 8.6–10.2)
Chloride: 103 mmol/L (ref 96–106)
Creatinine, Ser: 1.15 mg/dL (ref 0.76–1.27)
Glucose: 208 mg/dL — ABNORMAL HIGH (ref 70–99)
Potassium: 4.4 mmol/L (ref 3.5–5.2)
Sodium: 139 mmol/L (ref 134–144)
eGFR: 66 mL/min/{1.73_m2} (ref >59)

## 2022-01-17 NOTE — Telephone Encounter (Addendum)
Results called to patient who endorses understanding!    ----- Message from Emmaline Life, NP sent at 01/17/2022 12:45 PM EST ----- Kidney function is stable on valsartan. We increased amlodipine yesterday when he walked into the office. Continue current plan of care.

## 2022-01-17 NOTE — Therapy (Signed)
OUTPATIENT PHYSICAL THERAPY TREATMENT NOTE   Patient Name: [name] MRN: [id number] DOB:Aug 20, 1946, 76 y.o., male Today's Date: 01/17/2022  PCP: [name], [name] G, MD REFERRING PROVIDER: [name], [name] G, MD       Past Medical History:  Diagnosis Date   A-fib [facility]) 03/04/2021   Anemia    Arthritis    Cancer ([place])    prostate   Chronic kidney disease    blood in urine    Constipation    Coronary artery disease    Depression    Diabetes mellitus without complication ([place])    Difficult intubation    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 it was hard to get the tube down his throat   Dyspnea    Family history of breast cancer    Fatty liver    Fatty liver    Frequent headaches    GERD (gastroesophageal reflux disease)    History of chicken pox    History of fainting spells of unknown cause    History of prostate cancer    Hyperlipidemia    Hypertension    Myocardial infarction [facility])    Peripheral neuropathy    Pneumonia    Prostate cancer ([place])    PTSD (post-traumatic stress disorder)    Sleep apnea    uses Cpap   Past Surgical History:  Procedure Laterality Date   APPENDECTOMY     BIOPSY  01/15/2021   Procedure: BIOPSY;  Surgeon: Irving Copas., MD;  Location: [place];  Service: Gastroenterology;;   CARDIAC CATHETERIZATION  06/26/2017   CARDIAC SURGERY     Triple Bypass   CHOLECYSTECTOMY  2010   COLONOSCOPY     CORONARY ARTERY BYPASS GRAFT  2004   ESOPHAGOGASTRODUODENOSCOPY (EGD) WITH PROPOFOL N/A 01/15/2021   Procedure: ESOPHAGOGASTRODUODENOSCOPY (EGD) WITH PROPOFOL;  Surgeon: Irving Copas., MD;  Location: [facility] ENDOSCOPY;  Service: Gastroenterology;  Laterality: N/A;   EUS N/A 01/15/2021   Procedure: UPPER ENDOSCOPIC ULTRASOUND (EUS) RADIAL;  Surgeon: Irving Copas., MD;  Location: [place];  Service: Gastroenterology;  Laterality: N/A;   EYE SURGERY Bilateral    cataract removal   FINE NEEDLE ASPIRATION  01/15/2021    Procedure: FINE NEEDLE ASPIRATION (FNA) LINEAR;  Surgeon: Irving Copas., MD;  Location: [place];  Service: Gastroenterology;;   LAPAROSCOPY N/A 07/01/2021   Procedure: STAGING LAPAROSCOPY;  Surgeon: Dwan Bolt, MD;  Location: [place];  Service: General;  Laterality: N/A;   PORTACATH PLACEMENT Right 02/21/2021   Procedure: INSERTION PORT-A-CATH;  Surgeon: Dwan Bolt, MD;  Location: [place];  Service: General;  Laterality: Right;   SPLENECTOMY, 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   Procedure: SPLENECTOMY;  Surgeon: Dwan Bolt, MD;  Location: [place];  Service: General;  Laterality: N/A;   TONSILLECTOMY  1958   Patient Active Problem List   Diagnosis Date Noted   Foot callus 10/21/2021   Cellulitis 09/22/2021   Gastritis, 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   Malnutrition of moderate degree 07/02/2021   Pancreatic cancer ([place]) 07/01/2021   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   Coronary artery disease of bypass graft of native heart with stable angina pectoris ([place]) 06/04/2021   Genetic testing 04/04/2021   History of prostate cancer    Family history of breast cancer    Port-A-Cath in place 03/14/2021   Atrial fibrillation ([place])    Syncope and collapse 03/04/2021   Primary cancer of body of pancreas ([place]) 01/31/2021   Chest pain of uncertain etiology 47/65/4650   Elevated lipase 10/17/2020  H/O agent Orange exposure 10/17/2020   Dyslipidemia, goal LDL below 70 10/17/2020   Hx of CABG 02/28/2020   DM (diabetes mellitus), type 2 with renal complications ([place]) 93/79/0240   Hypertension with heart disease 04/01/2019   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[place]) 04/01/2019   CKD (chronic kidney disease), stage III ([place]) 04/01/2019   Peripheral neuropathy 04/01/2019   Insomnia 11/30/2018  PCP: [name], [name] G, MD   REFERRING PROVIDER: [name], [name] G, MD   REFERRING DIAG: Frequent falls    THERAPY DIAG:  Frequent falls    ONSET DATE: Cancer diagnosis February   SUBJECTIVE:     SUBJECTIVE STATEMENT: Patient has started at the gym on his own. He reports he is feeling pretty good. He continues to have some fluctuations in his blood sugar. He was advised to continue to monitor.   PERTINENT HISTORY: Pancreatic cancer; arthritis and debility in both shoulders; dyspnea with exertion; heart attack; blood pressure fluctuation; spleen removal      PAIN:  No pain 1/27   PRECAUTIONS: Fall   WEIGHT BEARING RESTRICTIONS No   FALLS:  Has patient fallen in last 6 months? Yes, Number of falls: 3 major falls several small losses of balance The last time he fell he slid off the edge of the bed and couldn't hold himself; A few months ago he fell in the bathroom    LIVING ENVIRONMENT: Grab bars in the house; has steps in th house, but the patient dosen;'t have to go up them    OCCUPATION: retried   Works on computers   PLOF: Independent with household mobility with device   PATIENT GOALS    To improve strength and balance     Objective   TODAY'S TREATMENT: 2/3 Nu step L4 6 min  Leg press cybex 70 lbs reviewed set up 3x15  Lifestyles leg press 25 lbs reviewed the differences and benefits to each x15   Reviewed proper set up of the chest press   Blanace Narrow base eyes open CGA 2x30 se hold  Narrow base eyes closed min a 2x30 sec hold   Air-ex  : narrow base eyes open min a x30 sec  Narrow base eyes closed 2x30 sec hold mod a   Tandem both ways 2x30 sec hold mod a   Hurdle step over 2x10     1/31   Nu step L4 6 min  Bilateral er 2x10 yellow  Shoulder flexion with abduction 3x10 yellow   LS triceps pushdown 3x10 25 lbs  Row 3x10 39 lbs    1/27 Nu-step L4 5 min  Hip abduction 3x15 55 lbs  Knee extension 25 lbs 3x15   Row machine cybex 30 lbs  Leg press 3x15 55 lbs   1/25 Supine ball squeeze x30  Supine hip abduction x30 green  Supine march 2x15 each each leg.   Seated LAQ 3x15 green band   Narrow base of support 2x20 sec eyes open  CGA  Narrow base of support 2x20 sec eyes closed CGA  Tandem Stance: 2x20 sec hold each leg   Rocking back and fourth min a 2x10  Marching with min UE support 2x10 with min a of the therapist   Nu-step 6 min L3   1/16 Nu-step 6 min L3  Narrow base of support 2x20 sec eyes open CGA  Narrow base of support 2x20 sec eyes closed CGA  Tandem Stance: 2x20 sec hold each leg   Hip abduction machine 35 x10 45x10; 50lbs x10  1/11 Nu-step 6 min L3  Row 3x10 10 lbs first set next 2 15 lbs  Shoulder extension 3x10 10 lbs   Supine ball squeeze x30  Supine hip abduction x30  Supine march 2x15 each each leg.           PATIENT EDUCATION:  Education details: reviewed test and mesureas as well as POC going forward  Person educated: Patient Education method: Explanation, Demonstration, Tactile cues, and Verbal cues Education comprehension: verbalized understanding, returned demonstration, verbal cues required, tactile cues required, and needs further education     HOME EXERCISE PROGRAM: Access Code: [email or web address] URL: [email or web address] Date: 10/25/2021 Prepared by: Carolyne Littles   Exercises Seated Knee Extension with Resistance - 1 x daily - 7 x weekly - 3 sets - 10 reps Seated Hip Abduction with Resistance - 1 x daily - 7 x weekly - 3 sets - 10 reps Seated March - 1 x daily - 7 x weekly - 3 sets - 10 reps     ASSESSMENT:   CLINICAL IMPRESSION: Patient continues to make great progress. He was able to do exercises on the air-ex, which he wasn't able to do a few weeks ago. Therapy will continue to review gym equipment set up and use. He was shown the two different leg press's today. He tolerated well. Therapy also worked on higher level balance with him. We will continue to work over the next few visits on balance exercises as he progresses into a home program on his own.    Objective impairments include Abnormal gait, decreased activity tolerance, decreased balance,  decreased endurance, decreased mobility, difficulty walking, decreased ROM, decreased strength, impaired UE functional use, and pain. These impairments are limiting patient from cleaning, driving, meal prep, yard work, and shopping. Personal factors including Age, Fitness, and 1-2 comorbidities: significant arthritis in his shoulders and peripheral neuropathy  are also affecting patient's functional outcome. Patient will benefit from skilled PT to address above impairments and improve overall function.   REHAB POTENTIAL: Good   CLINICAL DECISION MAKING: Evolving/moderate complexity more falls and declining mobility    EVALUATION COMPLEXITY: Moderate     GOALS: Goals reviewed with patient? Yes   SHORT TERM GOALS:   STG Name Target Date Goal status  1 Patient will increase gross LE strength by 5 lbs  Baseline:  11/22/2021 Patients strength has improved 1/27  2 Patient will require min a with tandem stance with eyes closed Baseline:  11/22/2021 Min a or less at this time  Achieved   3 Patient will ambulate 300' with improved posture and hip flexion  Baseline: 11/22/2021 Ambulating more straight up  Improved   LONG TERM GOALS:    LTG Name Target Date Goal status  1 BERG balance goal to be established  Baseline: 12/20/2021 INITIAL  2 Patient wil have no falls for a 2 week time period in order to improve safety  Baseline: 12/20/2021 INITIAL  3 Patient will report improved ability to perfrom ADLs without assistance  Baseline: 12/20/2021 INITIAL  PLAN: PT FREQUENCY: 2x/week   PT DURATION: 8 weeks   PLANNED INTERVENTIONS: Therapeutic exercises, Therapeutic activity, Neuro Muscular re-education, Balance training, Gait training, Patient/Family education, Joint mobilization, Stair training, DME instructions, Aquatic Therapy, Electrical stimulation, Cryotherapy, Moist heat, Taping, and Manual therapy   PLAN FOR NEXT SESSION: Patient may need lift to get into the pool. In the pool work on gait,  balance, and hip strengthening. On land develop his HEP for home including seated UE and LE exercises;  assess tolerance to exercises         Carney Living PT DPT  01/17/2022, 4:18 PM

## 2022-01-28 DIAGNOSIS — Z8546 Personal history of malignant neoplasm of prostate: Secondary | ICD-10-CM | POA: Diagnosis not present

## 2022-01-28 NOTE — Progress Notes (Signed)
Cardiology Office Note:    Date:  01/29/2022   ID:  [name], [name] [birth year], MRN [id number]  PCP:  [name], [name] G, MD   [facility] Providers Cardiologist:  Peter [name], MD     Referring MD: [name], [name] G, MD   Chief Complaint: elevated BP  History of Present Illness:    [name] is a very pleasant 76 y.o. male with a hx of orthostatic hypotension, CAD s/p CABG 2004, diabetes mellitus, hyperlipidemia, atrial fibrillation, CKD, and pancreatic cancer.   Previously followed by Dr. Lucia Gaskins, [facility] in [place]. Relocated to [place] in 2019 from [place], [place] to be close to daughter and only grandchild.  Last cardiac catheterization and July 2019 showed 90% proximal LAD, occluded nondominant left circumflex and occluded RCA.  The grafts were patent including LIMA to the LAD, SVG to RCA and SVG to ramus with 40 to 50% proximal stenosis.  Normal EF and EDP.  He is a [place] veteran and has a history of PTSD and agent orange exposure. He had previously taken midodrine for orthostatic hypotension which eventually resolved and midodrine was discontinued. His wife states that the [place] had him on too much medicine. Baseline creatinine is 1.4-1.5. He had pancreatectomy and splenectomy for pancreatic cancer in July 2022.  He called our office on 10/11/21 with concerns for elevated BP.  He was advised to increase losartan to 100 mg daily.  On 11/26/2021 he again called to report elevated BP. He was advised to start amlodipine 2.5 mg daily.  Dr. [name] also recommended that he come in for an office visit. He reported his blood pressure has been better since he increased the losartan and started amlodipine but remains high at times. Since last office visit with me on 12/30/21, we changed his losartan to valsartan due to continued high BP readings and he is now on highest dose. Amlodipine was increased to 5 mg daily on 01/16/22.    Today, he is here with his wife for follow-up  of hypertension. He reports he feels great other than concern about elevated BP readings. He denies chest pain, shortness of breath, fatigue, palpitations, melena, hematuria, hemoptysis, diaphoresis, weakness, presyncope, syncope, orthopnea, and PND. Has leg edema, slightly worse in RLE. He reports right leg has been swollen for several years but has recently worsened and now left leg is swollen as well. He has reduced use of salt on his food but states he is consuming more calories because he has a good appetite for the first time since completing chemo. Has joined [employer] and is working out with a Transport planner. He is not very active at home but occasionally walks around home with his grandson. He needs paper prescriptions to take to the [place].   Recheck lipid with bmet Past Medical History:  Diagnosis Date   A-fib ([facility]) 03/04/2021   Anemia    Arthritis    Cancer (HCC)    prostate   Chronic kidney disease    blood in urine    Constipation    Coronary artery disease    Depression    Diabetes mellitus without complication ([place])    Difficult intubation    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 it was hard to get the tube down his throat   Dyspnea    Family history of breast cancer    Fatty liver    Fatty liver    Frequent headaches    GERD (gastroesophageal reflux disease)    History of chicken pox  History of fainting spells of unknown cause    History of prostate cancer    Hyperlipidemia    Hypertension    Myocardial infarction [facility])    Peripheral neuropathy    Pneumonia    Prostate cancer ([place])    PTSD (post-traumatic stress disorder)    Sleep apnea    uses Cpap    Past Surgical History:  Procedure Laterality Date   APPENDECTOMY     BIOPSY  01/15/2021   Procedure: BIOPSY;  Surgeon: Irving Copas., MD;  Location: [facility];  Service: Gastroenterology;;   CARDIAC CATHETERIZATION  06/26/2017   CARDIAC SURGERY     Triple Bypass   CHOLECYSTECTOMY  2010   COLONOSCOPY     CORONARY  ARTERY BYPASS GRAFT  2004   ESOPHAGOGASTRODUODENOSCOPY (EGD) WITH PROPOFOL N/A 01/15/2021   Procedure: ESOPHAGOGASTRODUODENOSCOPY (EGD) WITH PROPOFOL;  Surgeon: Irving Copas., MD;  Location: [facility];  Service: Gastroenterology;  Laterality: N/A;   EUS N/A 01/15/2021   Procedure: UPPER ENDOSCOPIC ULTRASOUND (EUS) RADIAL;  Surgeon: Irving Copas., MD;  Location: [facility];  Service: Gastroenterology;  Laterality: N/A;   EYE SURGERY Bilateral    cataract removal   FINE NEEDLE ASPIRATION  01/15/2021   Procedure: FINE NEEDLE ASPIRATION (FNA) LINEAR;  Surgeon: Irving Copas., MD;  Location: [facility] ENDOSCOPY;  Service: Gastroenterology;;   LAPAROSCOPY N/A 07/01/2021   Procedure: STAGING LAPAROSCOPY;  Surgeon: Dwan Bolt, MD;  Location: [facility];  Service: General;  Laterality: N/A;   PORTACATH PLACEMENT Right 02/21/2021   Procedure: INSERTION PORT-A-CATH;  Surgeon: Dwan Bolt, MD;  Location: [facility];  Service: General;  Laterality: Right;   SPLENECTOMY, 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   Procedure: SPLENECTOMY;  Surgeon: Dwan Bolt, MD;  Location: [facility];  Service: General;  Laterality: N/A;   TONSILLECTOMY  1958    Current Medications: Current Meds  Medication Sig   acetaminophen (TYLENOL) 325 MG tablet Take 2 tablets (650 mg total) by mouth every 6 (six) hours as needed for mild pain. (Patient taking differently: Take 650 mg by mouth every 6 (six) hours as needed for mild pain or fever.)   amLODipine (NORVASC) 5 MG tablet Take 1 tablet (5 mg total) by mouth daily.   apixaban (ELIQUIS) 5 MG TABS tablet Take 5 mg by mouth 2 (two) times daily.   atorvastatin (LIPITOR) 80 MG tablet Take 40 mg by mouth at bedtime.   calcium-vitamin D (OSCAL WITH D) 500-200 MG-UNIT tablet Take 1 tablet by mouth 2 (two) times daily.   clotrimazole-betamethasone (LOTRISONE) cream Apply 1 application topically daily as needed.   cycloSPORINE (RESTASIS) 0.05 % ophthalmic emulsion Place 1 drop  into both eyes 2 (two) times daily as needed (dry eyes).   ferrous sulfate 325 (65 FE) MG tablet Take 1 tablet (325 mg total) by mouth daily with breakfast.   FLUoxetine (PROZAC) 20 MG capsule Take 40 mg by mouth daily.   glucose blood (ONETOUCH VERIO) test strip Use to test blood sugars 1-2 times daily.   insulin glargine (LANTUS) 100 UNIT/ML injection Inject 0.3 mLs (30 Units total) into the skin daily. (Patient taking differently: Inject 50 Units into the skin every morning.)   Multiple Vitamin (MULTI-VITAMINS) TABS Take 1 tablet by mouth daily.   nystatin powder Apply 1 application topically 3 (three) times daily.   pantoprazole (PROTONIX) 40 MG tablet Take 1 tablet (40 mg total) by mouth daily.   potassium chloride SA (KLOR-CON M) 20 MEQ tablet TAKE 1 TABLET BY  MOUTH EVERY DAY   tamsulosin (FLOMAX) 0.4 MG CAPS capsule Take 0.8 mg by mouth at bedtime.   traZODone (DESYREL) 100 MG tablet Take 100 mg by mouth at bedtime.   valsartan (DIOVAN) 320 MG tablet Take 1 tablet (320 mg total) by mouth daily.   vitamin B-12 (CYANOCOBALAMIN) 1000 MCG tablet Take 1,000 mcg by mouth daily.     Allergies:   Furosemide, Keflex [cephalexin], Lisinopril, and Terazosin   Social History   Socioeconomic History   Marital status: Married    Spouse name: Not on file   Number of children: 2   Years of education: Not on file   Highest education level: Not on file  Occupational History   Occupation: retired  Tobacco Use   Smoking status: Former    Types: Cigarettes    Quit date: 12/14/1975    Years since quitting: 46.1   Smokeless tobacco: Never  Vaping Use   Vaping Use: Not on file  Substance and Sexual Activity   Alcohol use: Not Currently   Drug use: Never   Sexual activity: Not Currently  Other Topics Concern   Not on file  Social History Narrative   ** Merged History Encounter **       Social Determinants of Health   Financial Resource Strain: Low Risk    Difficulty of Paying Living  Expenses: Not hard at all  Food Insecurity: No Food Insecurity   Worried About Charity fundraiser in the Last Year: Never true   [place] in the Last Year: Never true  Transportation Needs: No Transportation Needs   Lack of Transportation (Medical): No   Lack of Transportation (Non-Medical): No  Physical Activity: Insufficiently Active   Days of Exercise per Week: 2 days   Minutes of Exercise per Session: 60 min  Stress: No Stress Concern Present   Feeling of Stress : Not at all  Social Connections: Moderately Isolated   Frequency of Communication with Friends and Family: More than three times a week   Frequency of Social Gatherings with Friends and Family: More than three times a week   Attends Religious Services: Never   Marine scientist or Organizations: No   Attends Music therapist: Never   Marital Status: Married     Family History: The patient's family history includes Alcohol abuse in his brother; Arthritis in his mother and sister; Breast cancer in his sister; COPD in his brother; Cancer in his brother and sister; Depression in his daughter; Heart attack in his brother, brother, and mother; Heart disease in his brother, brother, and mother; Hyperlipidemia in his mother; Hypertension in his mother; Learning disabilities in his brother.  ROS:   Please see the history of present illness.  All other systems reviewed and are negative.  Labs/Other Studies Reviewed:    The following studies were reviewed today:  Echo 03/04/21  Left Ventricle: Left ventricular ejection fraction, by estimation, is 60  to 65%. The left ventricle has normal function. The left ventricle has no  regional wall motion abnormalities. The left ventricular internal cavity  size was normal in size. There is  no left ventricular hypertrophy. Left ventricular diastolic parameters are indeterminate.  Right Ventricle: The right ventricular size is normal. Right vetricular  wall  thickness was not assessed. Right ventricular systolic function is  normal. There is normal pulmonary artery systolic pressure. The tricuspid  regurgitant velocity is 2.28 m/s, and with an assumed right atrial pressure of 3  mmHg, the estimated right ventricular systolic pressure is 14.7 mmHg.  Left Atrium: Left atrial size was normal in size.  Right Atrium: Right atrial size was normal in size.  Pericardium: There is no evidence of pericardial effusion.  Mitral Valve: The mitral valve is normal in structure. No evidence of  mitral valve regurgitation.  Tricuspid Valve: The tricuspid valve is normal in structure. Tricuspid  valve regurgitation is trivial.  Aortic Valve: The aortic valve is normal in structure. Aortic valve  regurgitation is not visualized.  Pulmonic Valve: The pulmonic valve was normal in structure. Pulmonic valve  regurgitation is not visualized.  Aorta: The aortic root and ascending aorta are structurally normal, with  no evidence of dilitation.  Venous: The inferior vena cava is normal in size with greater than 50%  respiratory variability, suggesting right atrial pressure of 3 mmHg.   IAS/Shunts: No atrial level shunt detected by color flow Doppler.   Recent Labs: 03/05/2021: TSH 1.389 09/27/2021: Magnesium 1.8 01/07/2022: ALT 13; Hemoglobin 11.7; Platelet Count 272 01/16/2022: BUN 31; Creatinine, Ser 1.15; Potassium 4.4; Sodium 139  Recent Lipid Panel    Component Value Date/Time   CHOL 113 10/10/2020 0940   CHOL 101 12/16/2018 1003   TRIG 166 (H) 10/10/2020 0940   HDL 34 (L) 10/10/2020 0940   HDL 38 (L) 12/16/2018 1003   CHOLHDL 3.3 10/10/2020 0940   LDLCALC 55 10/10/2020 0940    Risk Assessment/Calculations:    CHA2DS2-VASc Score = 5  This indicates a 7.2% annual risk of stroke. The patient's score is based upon: CHF History: 0 HTN History: 1 Diabetes History: 1 Stroke History: 0 Vascular Disease History: 1 Age Score: 2 Gender Score: 0        Physical Exam:    VS:  BP 130/80 Comment: home BP 144/79   Pulse 79 Comment: home  pulse 80   Ht 5\' 11"  (1.803 m)    Wt 244 lb 3.2 oz (110.8 kg)    SpO2 99%    BMI 34.06 kg/m     Wt Readings from Last 3 Encounters:  01/29/22 244 lb 3.2 oz (110.8 kg)  01/07/22 239 lb 3.2 oz (108.5 kg)  01/06/22 241 lb 2 oz (109.4 kg)     GEN:  Well nourished, well developed in no acute distress HEENT: Normal NECK: No JVD; No carotid bruits CARDIAC: RRR, no murmurs, rubs, gallops RESPIRATORY:  Clear to auscultation without rales, wheezing or rhonchi  ABDOMEN: Soft, non-tender, non-distended MUSCULOSKELETAL:  2+ pitting edema bilateral lower extremities R > L. No deformity  SKIN: Warm and dry NEUROLOGIC:  Alert and oriented x 3 PSYCHIATRIC:  Normal affect   EKG:  EKG is ordered today. EKG reveals NSR at 69 bpm, sinus arrhythmia, no ST/T wave changes  Diagnoses:    1. Essential hypertension   2. S/P CABG (coronary artery bypass graft)   3. Coronary artery disease involving native coronary artery of native heart without angina pectoris   4. Paroxysmal atrial fibrillation (HCC)   5. Diabetes mellitus with cardiac complication ([place])   6. Hyperlipidemia LDL goal <70     Assessment and Plan:     Essential hypertension: BP is well-controlled today. Home BP readings are elevated over the last 4 days, with SBP 143 - 174 and DBP 65-83 since increasing amlodipine to 5 mg on 2/2. In comparison with our cuff, his home cuff is higher by 14 mmHg. BP remains above goal for the majority of home readings and with development of  leg edema as noted below, will start furosemide 20 mg daily. He already takes a potassium supplement due to hypokalemia during chemotherapy. Continue to monitor home BP readings. Will check basic metabolic panel in 1 week. Continue amlodipine, valsartan.   Leg edema: History of RLE, worse recently and new onset LLE edema. He denies dyspnea, orthopnea, PND. Consider echo if leg edema does  not improve on furosemide.   CAD without angina history of CABG: He denies chest pain, dyspnea, or other symptoms concerning for angina. No indication for further ischemia evaluation at this time. Not on aspirin in the setting of DOAC for PAF. Continue atorvastatin.   Hyperlipidemia LDL goal < 70: LDL 55 on 10/10/2020. Will check when he returns for bmet in 1 week.  Continue atorvastatin.   T2DM: A1C 6.9 on 01/06/22. Continue insulin, atorvastatin, losartan. Management per PCP.   PAF:  EKG done today for concerns for worsening leg edema. EKG reveals NSR @ 69 bpm, sinus arrhythmia. He denies palpitations, chest discomfort, or increased heart rate. No bleeding problems on Eliquis. Continue Eliquis at 5 mg twice daily.   Disposition: 3 months with Laurann Montana, NP      Medication Adjustments/Labs and Tests Ordered: Current medicines are reviewed at length with the patient today.  Concerns regarding medicines are outlined above.  No orders of the defined types were placed in this encounter.  No orders of the defined types were placed in this encounter.   Patient Instructions  Medication Instructions:  Your physician has recommended you make the following change in your medication:    *If you need a refill on your cardiac medications before your next appointment, please call your pharmacy*   Lab Work: Your physician recommends that you return for lab work in One Week for a BMET   If you have labs (blood work) drawn today and your tests are completely normal, you will receive your results only by: MyChart Message (if you have [facility]) OR A paper copy in the mail If you have any lab test that is abnormal or we need to change your treatment, we will call you to review the results.   Testing/Procedures: None ordered today    Follow-Up: At [facility], you and your health needs are our priority.  As part of our continuing mission to provide you with exceptional heart care, we  have created designated Provider Care Teams.  These Care Teams include your primary Cardiologist (physician) and Advanced Practice Providers (APPs -  Physician Assistants and Nurse Practitioners) who all work together to provide you with the care you need, when you need it.  We recommend signing up for the patient portal called "MyChart".  Sign up information is provided on this After Visit Summary.  MyChart is used to connect with patients for Virtual Visits (Telemedicine).  Patients are able to view lab/test results, encounter notes, upcoming appointments, etc.  Non-urgent messages can be sent to your provider as well.   To learn more about what you can do with MyChart, go to [email or web address].    Your next appointment:   3 month(s)  The format for your next appointment:   In Person  Provider:   Laurann Montana, NP{     Signed, Emmaline Life, NP  01/29/2022 3:12 PM    [place]

## 2022-01-29 ENCOUNTER — Encounter (HOSPITAL_BASED_OUTPATIENT_CLINIC_OR_DEPARTMENT_OTHER): Payer: Self-pay | Admitting: Physical Therapy

## 2022-01-29 ENCOUNTER — Encounter (HOSPITAL_BASED_OUTPATIENT_CLINIC_OR_DEPARTMENT_OTHER): Payer: Self-pay | Admitting: Nurse Practitioner

## 2022-01-29 ENCOUNTER — Other Ambulatory Visit: Payer: Self-pay

## 2022-01-29 ENCOUNTER — Ambulatory Visit (INDEPENDENT_AMBULATORY_CARE_PROVIDER_SITE_OTHER): Payer: Medicare Other | Admitting: Nurse Practitioner

## 2022-01-29 ENCOUNTER — Ambulatory Visit (HOSPITAL_BASED_OUTPATIENT_CLINIC_OR_DEPARTMENT_OTHER): Payer: Medicare Other | Admitting: Physical Therapy

## 2022-01-29 VITALS — BP 130/80 | HR 79 | Ht 71.0 in | Wt 244.2 lb

## 2022-01-29 DIAGNOSIS — E1159 Type 2 diabetes mellitus with other circulatory complications: Secondary | ICD-10-CM

## 2022-01-29 DIAGNOSIS — Z951 Presence of aortocoronary bypass graft: Secondary | ICD-10-CM | POA: Diagnosis not present

## 2022-01-29 DIAGNOSIS — M6281 Muscle weakness (generalized): Secondary | ICD-10-CM | POA: Diagnosis not present

## 2022-01-29 DIAGNOSIS — E785 Hyperlipidemia, unspecified: Secondary | ICD-10-CM

## 2022-01-29 DIAGNOSIS — I2510 Atherosclerotic heart disease of native coronary artery without angina pectoris: Secondary | ICD-10-CM | POA: Diagnosis not present

## 2022-01-29 DIAGNOSIS — I480 Paroxysmal atrial fibrillation: Secondary | ICD-10-CM | POA: Diagnosis not present

## 2022-01-29 DIAGNOSIS — R296 Repeated falls: Secondary | ICD-10-CM | POA: Diagnosis not present

## 2022-01-29 DIAGNOSIS — R2689 Other abnormalities of gait and mobility: Secondary | ICD-10-CM

## 2022-01-29 DIAGNOSIS — I10 Essential (primary) hypertension: Secondary | ICD-10-CM

## 2022-01-29 MED ORDER — AMLODIPINE BESYLATE 5 MG PO TABS: 5.0000 mg | ORAL_TABLET | Freq: Every day | ORAL | 3 refills | Status: DC

## 2022-01-29 MED ORDER — FUROSEMIDE 20 MG PO TABS
20.0000 mg | ORAL_TABLET | Freq: Every day | ORAL | 3 refills | Status: DC
Start: 2022-01-29 — End: 2023-06-10

## 2022-01-29 MED ORDER — FUROSEMIDE 20 MG PO TABS: 20.0000 mg | ORAL_TABLET | Freq: Every day | ORAL | 3 refills | Status: DC

## 2022-01-29 MED ORDER — VALSARTAN 320 MG PO TABS: 320.0000 mg | ORAL_TABLET | Freq: Every day | ORAL | 3 refills | Status: DC

## 2022-01-29 NOTE — Patient Instructions (Addendum)
Medication Instructions:  Your physician has recommended you make the following change in your medication:   Start: Lasix 20mg  Daily   *If you need a refill on your cardiac medications before your next appointment, please call your pharmacy*   Lab Work: Your physician recommends that you return for lab work in One Week for a BMET and Fasting Lipid Panel   If you have labs (blood work) drawn today and your tests are completely normal, you will receive your results only by: MyChart Message (if you have [facility]) OR A paper copy in the mail If you have any lab test that is abnormal or we need to change your treatment, we will call you to review the results.   Testing/Procedures: None ordered today    Follow-Up: At [facility], you and your health needs are our priority.  As part of our continuing mission to provide you with exceptional heart care, we have created designated Provider Care Teams.  These Care Teams include your primary Cardiologist (physician) and Advanced Practice Providers (APPs -  Physician Assistants and Nurse Practitioners) who all work together to provide you with the care you need, when you need it.  We recommend signing up for the patient portal called "MyChart".  Sign up information is provided on this After Visit Summary.  MyChart is used to connect with patients for Virtual Visits (Telemedicine).  Patients are able to view lab/test results, encounter notes, upcoming appointments, etc.  Non-urgent messages can be sent to your provider as well.   To learn more about what you can do with MyChart, go to [email or web address].    Your next appointment:   3 month(s)  The format for your next appointment:   In Person  Provider:   Laurann Montana, NP{

## 2022-01-29 NOTE — Therapy (Signed)
OUTPATIENT PHYSICAL THERAPY TREATMENT NOTE/progress/discharge      Patient Name: [name] MRN: [id number] DOB:1946/08/26, 76 y.o., male Today's Date: 01/29/2022 Progress Note Reporting Period 12/18/2021 to 01/29/2022  See note below for Objective Data and Assessment of Progress/Goals.       PCP: [name], [name] G, MD REFERRING PROVIDER: [name], [name] G, MD   PT End of Session - 01/29/22 1306     Visit Number 19    Number of Visits 22    Date for PT Re-Evaluation 01/30/22    Authorization Type [employer]: progress note on visit 26    PT Start Time 1300    PT Stop Time 1342    PT Time Calculation (min) 42 min    Activity Tolerance Patient tolerated treatment well    Behavior During Therapy WFL for tasks assessed/performed                Past Medical History:  Diagnosis Date   A-fib ([place]) 03/04/2021   Anemia    Arthritis    Cancer (HCC)    prostate   Chronic kidney disease    blood in urine    Constipation    Coronary artery disease    Depression    Diabetes mellitus without complication ([place])    Difficult intubation    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 it was hard to get the tube down his throat   Dyspnea    Family history of breast cancer    Fatty liver    Fatty liver    Frequent headaches    GERD (gastroesophageal reflux disease)    History of chicken pox    History of fainting spells of unknown cause    History of prostate cancer    Hyperlipidemia    Hypertension    Myocardial infarction [facility])    Peripheral neuropathy    Pneumonia    Prostate cancer ([place])    PTSD (post-traumatic stress disorder)    Sleep apnea    uses Cpap   Past Surgical History:  Procedure Laterality Date   APPENDECTOMY     BIOPSY  01/15/2021   Procedure: BIOPSY;  Surgeon: Irving Copas., MD;  Location: [place];  Service: Gastroenterology;;   CARDIAC CATHETERIZATION  06/26/2017   CARDIAC SURGERY     Triple Bypass   CHOLECYSTECTOMY  2010   COLONOSCOPY      CORONARY ARTERY BYPASS GRAFT  2004   ESOPHAGOGASTRODUODENOSCOPY (EGD) WITH PROPOFOL N/A 01/15/2021   Procedure: ESOPHAGOGASTRODUODENOSCOPY (EGD) WITH PROPOFOL;  Surgeon: Irving Copas., MD;  Location: [facility] ENDOSCOPY;  Service: Gastroenterology;  Laterality: N/A;   EUS N/A 01/15/2021   Procedure: UPPER ENDOSCOPIC ULTRASOUND (EUS) RADIAL;  Surgeon: Irving Copas., MD;  Location: [place];  Service: Gastroenterology;  Laterality: N/A;   EYE SURGERY Bilateral    cataract removal   FINE NEEDLE ASPIRATION  01/15/2021   Procedure: FINE NEEDLE ASPIRATION (FNA) LINEAR;  Surgeon: Irving Copas., MD;  Location: [facility] ENDOSCOPY;  Service: Gastroenterology;;   LAPAROSCOPY N/A 07/01/2021   Procedure: STAGING LAPAROSCOPY;  Surgeon: Dwan Bolt, MD;  Location: [place];  Service: General;  Laterality: N/A;   PORTACATH PLACEMENT Right 02/21/2021   Procedure: INSERTION PORT-A-CATH;  Surgeon: Dwan Bolt, MD;  Location: [place];  Service: General;  Laterality: Right;   SPLENECTOMY, 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   Procedure: SPLENECTOMY;  Surgeon: Dwan Bolt, MD;  Location: [place];  Service: General;  Laterality: N/A;   [place]   Patient Active  Problem List   Diagnosis Date Noted   Foot callus 10/21/2021   Cellulitis 09/22/2021   Gastritis, 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   Malnutrition of moderate degree 07/02/2021   Pancreatic cancer ([place]) 07/01/2021   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/2021   Coronary artery disease of bypass graft of native heart with stable angina pectoris ([place]) 06/04/2021   Genetic testing 04/04/2021   History of prostate cancer    Family history of breast cancer    Port-A-Cath in place 03/14/2021   Atrial fibrillation ([place])    Syncope and collapse 03/04/2021   Primary cancer of body of pancreas ([place]) 01/31/2021   Chest pain of uncertain etiology 16/60/6301   Elevated lipase 10/17/2020   H/O agent Orange exposure 10/17/2020   Dyslipidemia, goal LDL below  70 10/17/2020   Hx of CABG 02/28/2020   DM (diabetes mellitus), type 2 with renal complications ([place]) 60/09/9322   Hypertension with heart disease 04/01/2019   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[place]) 04/01/2019   CKD (chronic kidney disease), stage III ([place]) 04/01/2019   Peripheral neuropathy 04/01/2019   Insomnia 11/30/2018  PCP: [name], [name] G, MD   REFERRING PROVIDER: [name], [name] G, MD   REFERRING DIAG: Frequent falls    THERAPY DIAG:  Frequent falls    ONSET DATE: Cancer diagnosis February   SUBJECTIVE:    SUBJECTIVE STATEMENT: Patient has started at the gym on his own. He reports he is feeling pretty good. He continues to have some fluctuations in his blood sugar. He was advised to continue to monitor.   PERTINENT HISTORY: Pancreatic cancer; arthritis and debility in both shoulders; dyspnea with exertion; heart attack; blood pressure fluctuation; spleen removal      PAIN:  No pain 1/27   PRECAUTIONS: Fall   WEIGHT BEARING RESTRICTIONS No   FALLS:  Has patient fallen in last 6 months? Yes, Number of falls: 3 major falls several small losses of balance The last time he fell he slid off the edge of the bed and couldn't hold himself; A few months ago he fell in the bathroom    LIVING ENVIRONMENT: Grab bars in the house; has steps in th house, but the patient dosen;'t have to go up them    OCCUPATION: retried   Works on computers   PLOF: Independent with household mobility with device   PATIENT GOALS    To improve strength and balance     Objective   Grip :  right 20 lbs left 30 lbs         Grip:              45           Left 35  LE MMT:   MMT Right 10/25/2021 Left 10/25/2021  Hip flexion 16.5   1/4   33.4  2/15  50.8  15.6   1/4   34.1  2/15  36.9    Hip extension      Hip abduction 17.8   1/4   47.5   2/15  50.7 18.8   1/4  46.0  2/15  79.5  Hip adduction      Hip internal rotation      Hip external  rotation      Knee flexion      Knee extension 29.4   46.1  2/15   62.8   23.9   39.3  2/15  62.7  Ankle dorsiflexion      Ankle plantarflexion      Ankle  inversion      Ankle eversion       (Blank rows = not tested)      TODAY'S TREATMENT: 2/16 Reviewed testing; reviewed plan going forward  Reviewed strength gains and areas to work on. Talked to patient about set up of machines and what to work on.   Row machine 3x10 30 lbs   2/3 Nu step L4 6 min  Leg press cybex 70 lbs reviewed set up 3x15  Lifestyles leg press 25 lbs reviewed the differences and benefits to each x15   Reviewed proper set up of the chest press   Blanace Narrow base eyes open CGA 2x30 se hold  Narrow base eyes closed min a 2x30 sec hold   Air-ex  : narrow base eyes open min a x30 sec  Narrow base eyes closed 2x30 sec hold mod a   Tandem both ways 2x30 sec hold mod a   Hurdle step over 2x10     1/31   Nu step L4 6 min  Bilateral er 2x10 yellow  Shoulder flexion with abduction 3x10 yellow   LS triceps pushdown 3x10 25 lbs  Row 3x10 39 lbs    1/27 Nu-step L4 5 min  Hip abduction 3x15 55 lbs  Knee extension 25 lbs 3x15   Row machine cybex 30 lbs  Leg press 3x15 55 lbs   1/25 Supine ball squeeze x30  Supine hip abduction x30 green  Supine march 2x15 each each leg.   Seated LAQ 3x15 green band   Narrow base of support 2x20 sec eyes open CGA  Narrow base of support 2x20 sec eyes closed CGA  Tandem Stance: 2x20 sec hold each leg   Rocking back and fourth min a 2x10  Marching with min UE support 2x10 with min a of the therapist   Nu-step 6 min L3   1/16 Nu-step 6 min L3  Narrow base of support 2x20 sec eyes open CGA  Narrow base of support 2x20 sec eyes closed CGA  Tandem Stance: 2x20 sec hold each leg   Hip abduction machine 35 x10 45x10; 50lbs x10   1/11 Nu-step 6 min L3  Row 3x10 10 lbs first set next 2 15 lbs  Shoulder extension 3x10 10 lbs   Supine  ball squeeze x30  Supine hip abduction x30  Supine march 2x15 each each leg.           PATIENT EDUCATION:  Education details: reviewed test and mesureas as well as POC going forward  Person educated: Patient Education method: Explanation, Demonstration, Tactile cues, and Verbal cues Education comprehension: verbalized understanding, returned demonstration, verbal cues required, tactile cues required, and needs further education     HOME EXERCISE PROGRAM: Access Code: [email or web address] URL: [email or web address] Date: 10/25/2021 Prepared by: Carolyne Littles   Exercises Seated Knee Extension with Resistance - 1 x daily - 7 x weekly - 3 sets - 10 reps Seated Hip Abduction with Resistance - 1 x daily - 7 x weekly - 3 sets - 10 reps Seated March - 1 x daily - 7 x weekly - 3 sets - 10 reps     ASSESSMENT:   CLINICAL IMPRESSION: Patient has made excellent progress. His strength numbers have all improved significantly. He has joined the gym and is going at least 2-3 days a week. He is supplementing the gym with some band work at home. He continues to have mild balance deficits, but that has improved as well. He is using his cane on  days where his blood sugar is good. He has a full program that he can continue with on his own at this point. He will continue working at the gym on his own. He was strongly advised to use the walker on days when he feels unsteady. D/c to HEP at this time.    Objective impairments include Abnormal gait, decreased activity tolerance, decreased balance, decreased endurance, decreased mobility, difficulty walking, decreased ROM, decreased strength, impaired UE functional use, and pain. These impairments are limiting patient from cleaning, driving, meal prep, yard work, and shopping. Personal factors including Age, Fitness, and 1-2 comorbidities: significant arthritis in his shoulders and peripheral neuropathy  are also affecting patient's functional outcome.  Patient will benefit from skilled PT to address above impairments and improve overall function.   REHAB POTENTIAL: Good   CLINICAL DECISION MAKING: Evolving/moderate complexity more falls and declining mobility    EVALUATION COMPLEXITY: Moderate     GOALS: Goals reviewed with patient? Yes   SHORT TERM GOALS:   STG Name Target Date Goal status  1 Patient will increase gross LE strength by 5 lbs  Baseline:  11/22/2021 Patients strength has improved 1/27 Achived  2 Patient will require min a with tandem stance with eyes closed Baseline:  11/22/2021 Min a or less at this time  Achieved   3 Patient will ambulate 300' with improved posture and hip flexion  Baseline: 11/22/2021 Ambulating more straight up and for well over300'  Achieved   LONG TERM GOALS:    LTG Name Target Date Goal status  1 BERG balance goal to be established  Baseline: 12/20/2021 Achieved   2 Patient wil have no falls for a 2 week time period in order to improve safety  Baseline: 12/20/2021 Has not fallen in over a month  Achieved   3 Patient will report improved ability to perfrom ADLs without assistance  Baseline: 12/20/2021 Performing without  assistance  Achieved  PLAN: PT FREQUENCY: 2x/week   PT DURATION: 8 weeks   PLANNED INTERVENTIONS: Therapeutic exercises, Therapeutic activity, Neuro Muscular re-education, Balance training, Gait training, Patient/Family education, Joint mobilization, Stair training, DME instructions, Aquatic Therapy, Electrical stimulation, Cryotherapy, Moist heat, Taping, and Manual therapy   PLAN FOR NEXT SESSION: Patient may need lift to get into the pool. In the pool work on gait, balance, and hip strengthening. On land develop his HEP for home including seated UE and LE exercises; assess tolerance to exercises     PHYSICAL THERAPY DISCHARGE SUMMARY  Visits from Start of Care: 19  Current functional level related to goals / functional outcomes: Significant improvement in strength  and mobility    Remaining deficits: Balance at times    Education / Equipment: HEP   Patient agrees to discharge. Patient goals were met. Patient is being discharged due to meeting the stated rehab goals.     Carney Living PT DPT  01/29/2022, 1:13 PM

## 2022-01-30 ENCOUNTER — Encounter (HOSPITAL_BASED_OUTPATIENT_CLINIC_OR_DEPARTMENT_OTHER): Payer: Self-pay | Admitting: Physical Therapy

## 2022-02-04 DIAGNOSIS — R3121 Asymptomatic microscopic hematuria: Secondary | ICD-10-CM | POA: Diagnosis not present

## 2022-02-04 DIAGNOSIS — R3915 Urgency of urination: Secondary | ICD-10-CM | POA: Diagnosis not present

## 2022-02-04 DIAGNOSIS — Z8546 Personal history of malignant neoplasm of prostate: Secondary | ICD-10-CM | POA: Diagnosis not present

## 2022-02-05 ENCOUNTER — Encounter (HOSPITAL_BASED_OUTPATIENT_CLINIC_OR_DEPARTMENT_OTHER): Payer: Self-pay

## 2022-02-06 ENCOUNTER — Telehealth (HOSPITAL_BASED_OUTPATIENT_CLINIC_OR_DEPARTMENT_OTHER): Payer: Self-pay | Admitting: Nurse Practitioner

## 2022-02-06 ENCOUNTER — Encounter (HOSPITAL_BASED_OUTPATIENT_CLINIC_OR_DEPARTMENT_OTHER): Payer: Self-pay

## 2022-02-06 ENCOUNTER — Other Ambulatory Visit: Payer: Self-pay

## 2022-02-06 ENCOUNTER — Inpatient Hospital Stay (HOSPITAL_BASED_OUTPATIENT_CLINIC_OR_DEPARTMENT_OTHER)
Admission: EM | Admit: 2022-02-06 | Discharge: 2022-02-07 | DRG: 439 | Disposition: A | Discharge status: Home / Self Care | Payer: Medicare Other | Attending: Internal Medicine | Admitting: Internal Medicine

## 2022-02-06 ENCOUNTER — Emergency Department (HOSPITAL_BASED_OUTPATIENT_CLINIC_OR_DEPARTMENT_OTHER): Payer: Medicare Other

## 2022-02-06 DIAGNOSIS — Z951 Presence of aortocoronary bypass graft: Secondary | ICD-10-CM | POA: Diagnosis not present

## 2022-02-06 DIAGNOSIS — I119 Hypertensive heart disease without heart failure: Secondary | ICD-10-CM | POA: Diagnosis present

## 2022-02-06 DIAGNOSIS — R7401 Elevation of levels of liver transaminase levels: Secondary | ICD-10-CM | POA: Diagnosis present

## 2022-02-06 DIAGNOSIS — Z90411 Acquired partial absence of pancreas: Secondary | ICD-10-CM

## 2022-02-06 DIAGNOSIS — E860 Dehydration: Secondary | ICD-10-CM | POA: Diagnosis present

## 2022-02-06 DIAGNOSIS — G4733 Obstructive sleep apnea (adult) (pediatric): Secondary | ICD-10-CM | POA: Diagnosis not present

## 2022-02-06 DIAGNOSIS — Z79891 Long term (current) use of opiate analgesic: Secondary | ICD-10-CM

## 2022-02-06 DIAGNOSIS — F4310 Post-traumatic stress disorder, unspecified: Secondary | ICD-10-CM | POA: Diagnosis present

## 2022-02-06 DIAGNOSIS — Z9049 Acquired absence of other specified parts of digestive tract: Secondary | ICD-10-CM

## 2022-02-06 DIAGNOSIS — Z888 Allergy status to other drugs, medicaments and biological substances status: Secondary | ICD-10-CM

## 2022-02-06 DIAGNOSIS — I700 Atherosclerosis of aorta: Secondary | ICD-10-CM | POA: Diagnosis not present

## 2022-02-06 DIAGNOSIS — E1129 Type 2 diabetes mellitus with other diabetic kidney complication: Secondary | ICD-10-CM | POA: Diagnosis present

## 2022-02-06 DIAGNOSIS — Z79899 Other long term (current) drug therapy: Secondary | ICD-10-CM

## 2022-02-06 DIAGNOSIS — I1310 Hypertensive heart and chronic kidney disease without heart failure, with stage 1 through stage 4 chronic kidney disease, or unspecified chronic kidney disease: Secondary | ICD-10-CM | POA: Insufficient documentation

## 2022-02-06 DIAGNOSIS — Z9081 Acquired absence of spleen: Secondary | ICD-10-CM

## 2022-02-06 DIAGNOSIS — N183 Chronic kidney disease, stage 3 unspecified: Secondary | ICD-10-CM | POA: Diagnosis present

## 2022-02-06 DIAGNOSIS — I252 Old myocardial infarction: Secondary | ICD-10-CM

## 2022-02-06 DIAGNOSIS — Z794 Long term (current) use of insulin: Secondary | ICD-10-CM

## 2022-02-06 DIAGNOSIS — M1990 Unspecified osteoarthritis, unspecified site: Secondary | ICD-10-CM | POA: Diagnosis present

## 2022-02-06 DIAGNOSIS — N179 Acute kidney failure, unspecified: Secondary | ICD-10-CM | POA: Diagnosis present

## 2022-02-06 DIAGNOSIS — I482 Chronic atrial fibrillation, unspecified: Secondary | ICD-10-CM

## 2022-02-06 DIAGNOSIS — F32A Depression, unspecified: Secondary | ICD-10-CM | POA: Diagnosis not present

## 2022-02-06 DIAGNOSIS — I2510 Atherosclerotic heart disease of native coronary artery without angina pectoris: Secondary | ICD-10-CM | POA: Diagnosis not present

## 2022-02-06 DIAGNOSIS — E785 Hyperlipidemia, unspecified: Secondary | ICD-10-CM | POA: Diagnosis not present

## 2022-02-06 DIAGNOSIS — Z7901 Long term (current) use of anticoagulants: Secondary | ICD-10-CM

## 2022-02-06 DIAGNOSIS — K760 Fatty (change of) liver, not elsewhere classified: Secondary | ICD-10-CM | POA: Diagnosis not present

## 2022-02-06 DIAGNOSIS — Z87891 Personal history of nicotine dependence: Secondary | ICD-10-CM | POA: Diagnosis not present

## 2022-02-06 DIAGNOSIS — R109 Unspecified abdominal pain: Secondary | ICD-10-CM | POA: Diagnosis not present

## 2022-02-06 DIAGNOSIS — Z9221 Personal history of antineoplastic chemotherapy: Secondary | ICD-10-CM

## 2022-02-06 DIAGNOSIS — K859 Acute pancreatitis without necrosis or infection, unspecified: Principal | ICD-10-CM | POA: Diagnosis present

## 2022-02-06 DIAGNOSIS — Z8507 Personal history of malignant neoplasm of pancreas: Secondary | ICD-10-CM

## 2022-02-06 DIAGNOSIS — R079 Chest pain, unspecified: Secondary | ICD-10-CM | POA: Diagnosis not present

## 2022-02-06 DIAGNOSIS — Z8546 Personal history of malignant neoplasm of prostate: Secondary | ICD-10-CM | POA: Diagnosis not present

## 2022-02-06 DIAGNOSIS — Z20822 Contact with and (suspected) exposure to covid-19: Secondary | ICD-10-CM | POA: Diagnosis not present

## 2022-02-06 DIAGNOSIS — I4891 Unspecified atrial fibrillation: Secondary | ICD-10-CM | POA: Diagnosis present

## 2022-02-06 DIAGNOSIS — R0789 Other chest pain: Secondary | ICD-10-CM | POA: Diagnosis not present

## 2022-02-06 DIAGNOSIS — E1140 Type 2 diabetes mellitus with diabetic neuropathy, unspecified: Secondary | ICD-10-CM | POA: Diagnosis not present

## 2022-02-06 DIAGNOSIS — E1122 Type 2 diabetes mellitus with diabetic chronic kidney disease: Secondary | ICD-10-CM | POA: Diagnosis not present

## 2022-02-06 DIAGNOSIS — K858 Other acute pancreatitis without necrosis or infection: Secondary | ICD-10-CM

## 2022-02-06 DIAGNOSIS — Z83438 Family history of other disorder of lipoprotein metabolism and other lipidemia: Secondary | ICD-10-CM

## 2022-02-06 DIAGNOSIS — I129 Hypertensive chronic kidney disease with stage 1 through stage 4 chronic kidney disease, or unspecified chronic kidney disease: Secondary | ICD-10-CM | POA: Diagnosis not present

## 2022-02-06 DIAGNOSIS — N189 Chronic kidney disease, unspecified: Secondary | ICD-10-CM

## 2022-02-06 DIAGNOSIS — K219 Gastro-esophageal reflux disease without esophagitis: Secondary | ICD-10-CM | POA: Diagnosis present

## 2022-02-06 DIAGNOSIS — R1013 Epigastric pain: Secondary | ICD-10-CM | POA: Diagnosis not present

## 2022-02-06 DIAGNOSIS — I480 Paroxysmal atrial fibrillation: Secondary | ICD-10-CM | POA: Diagnosis not present

## 2022-02-06 DIAGNOSIS — I10 Essential (primary) hypertension: Secondary | ICD-10-CM | POA: Diagnosis not present

## 2022-02-06 DIAGNOSIS — Z8249 Family history of ischemic heart disease and other diseases of the circulatory system: Secondary | ICD-10-CM

## 2022-02-06 LAB — TROPONIN I (HIGH SENSITIVITY)
Troponin I (High Sensitivity): 18 ng/L — ABNORMAL HIGH (ref <18)
Troponin I (High Sensitivity): 18 ng/L — ABNORMAL HIGH (ref <18)

## 2022-02-06 LAB — COMPREHENSIVE METABOLIC PANEL
ALT: 34 U/L (ref 0–44)
AST: 67 U/L — ABNORMAL HIGH (ref 15–41)
Albumin: 3.3 g/dL — ABNORMAL LOW (ref 3.5–5.0)
Alkaline Phosphatase: 224 U/L — ABNORMAL HIGH (ref 38–126)
Anion gap: 14 (ref 5–15)
BUN: 35 mg/dL — ABNORMAL HIGH (ref 8–23)
CO2: 23 mmol/L (ref 22–32)
Calcium: 9.4 mg/dL (ref 8.9–10.3)
Chloride: 105 mmol/L (ref 98–111)
Creatinine, Ser: 1.37 mg/dL — ABNORMAL HIGH (ref 0.61–1.24)
GFR, Estimated: 53 mL/min — ABNORMAL LOW (ref >60)
Glucose, Bld: 95 mg/dL (ref 70–99)
Potassium: 3.6 mmol/L (ref 3.5–5.1)
Sodium: 142 mmol/L (ref 135–145)
Total Bilirubin: 0.8 mg/dL (ref 0.3–1.2)
Total Protein: 6.8 g/dL (ref 6.5–8.1)

## 2022-02-06 LAB — CBC
HCT: 35.6 % — ABNORMAL LOW (ref 39.0–52.0)
Hemoglobin: 11.8 g/dL — ABNORMAL LOW (ref 13.0–17.0)
MCH: 30.8 pg (ref 26.0–34.0)
MCHC: 33.1 g/dL (ref 30.0–36.0)
MCV: 93 fL (ref 80.0–100.0)
Platelets: 268 10*3/uL (ref 150–400)
RBC: 3.83 MIL/uL — ABNORMAL LOW (ref 4.22–5.81)
RDW: 14.9 % (ref 11.5–15.5)
WBC: 11.5 10*3/uL — ABNORMAL HIGH (ref 4.0–10.5)
nRBC: 0 % (ref 0.0–0.2)

## 2022-02-06 LAB — GLUCOSE, CAPILLARY: Glucose-Capillary: 200 mg/dL — ABNORMAL HIGH (ref 70–99)

## 2022-02-06 LAB — RESP PANEL BY RT-PCR (FLU A&B, COVID) ARPGX2
Influenza A by PCR: NEGATIVE
Influenza B by PCR: NEGATIVE
SARS Coronavirus 2 by RT PCR: NEGATIVE

## 2022-02-06 LAB — LIPASE, BLOOD: Lipase: 3189 U/L — ABNORMAL HIGH (ref 11–51)

## 2022-02-06 IMAGING — DX DG CHEST 1V PORT
1 series · 1 of 1 positions shown · non-contrast
Comparison: [DATE]

CLINICAL DATA: Chest pain beginning today. Coronary artery disease.
Prostate carcinoma.

EXAM:
PORTABLE CHEST 1 VIEW

[chest ap]
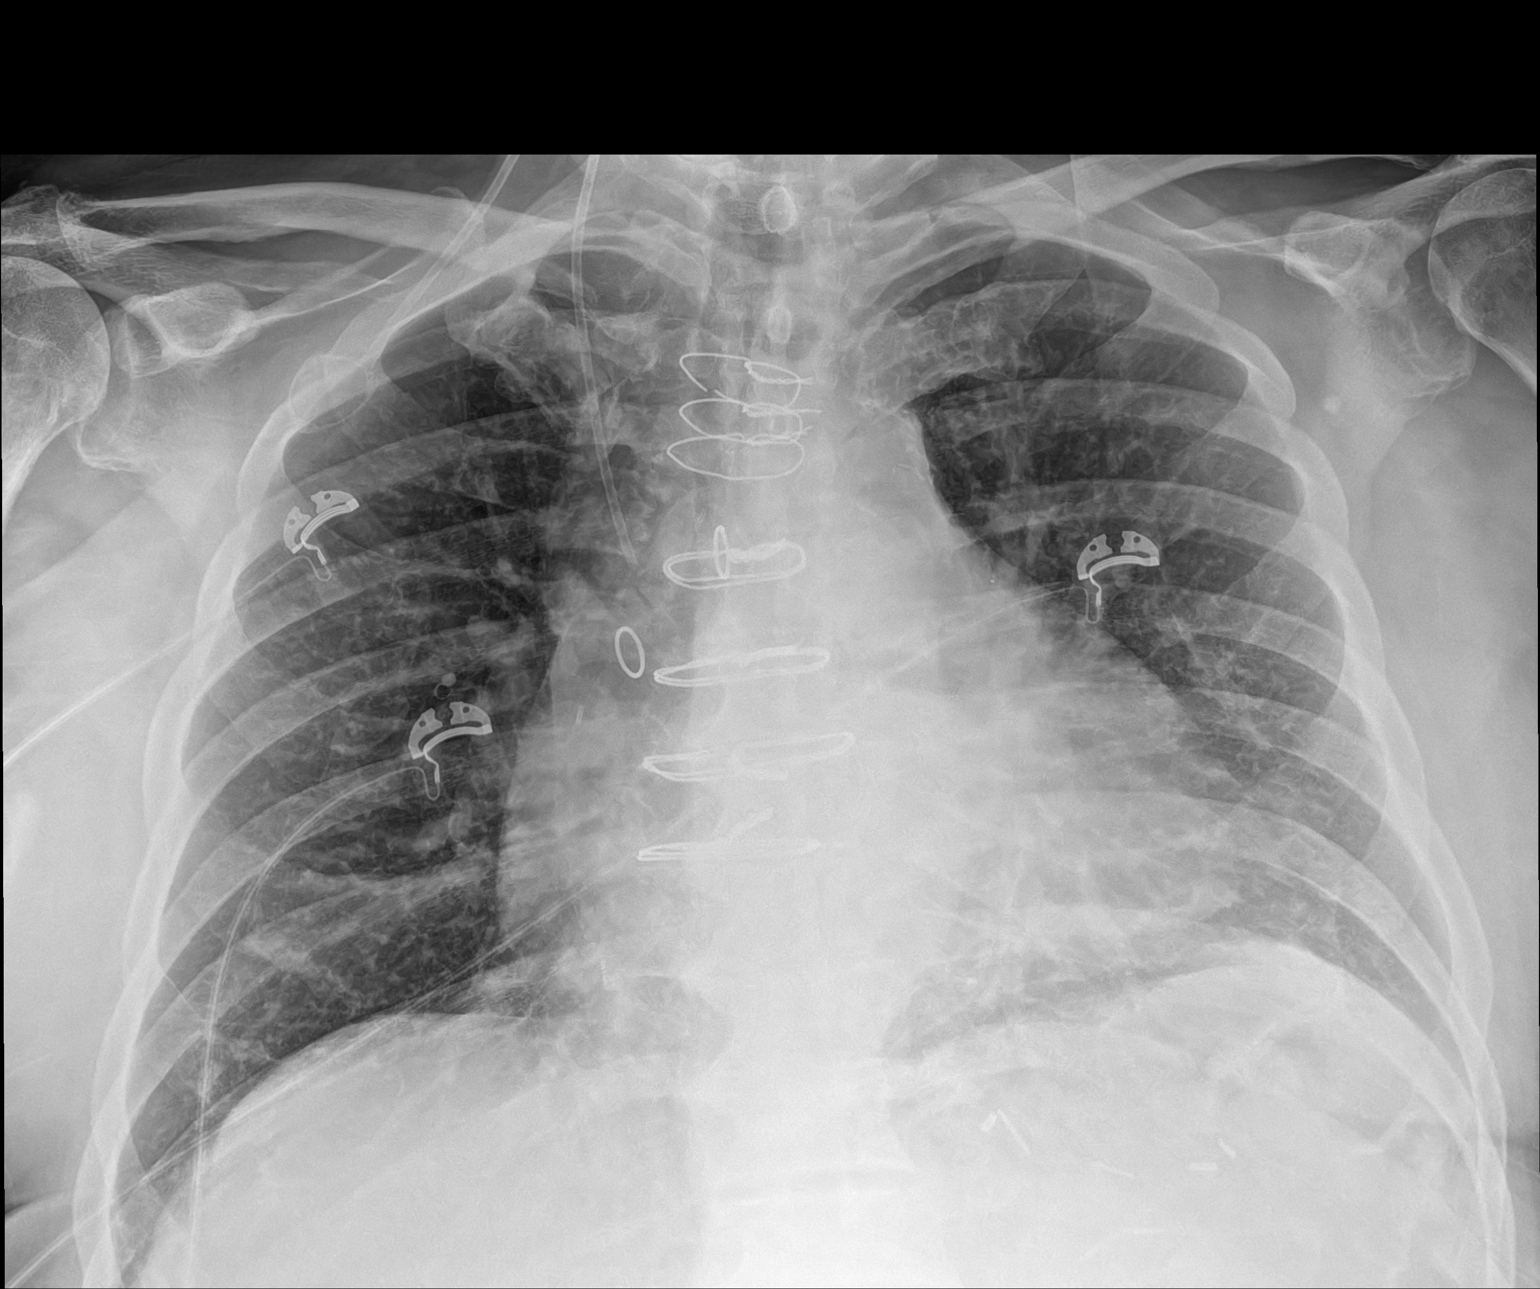

[1 of 1 positions shown; findings below may reference images not displayed]

FINDINGS: The heart size and mediastinal contours are within normal limits.
Right-sided Port-A-Cath remains in appropriate position. Prior CABG
again noted. Both lungs are clear. The visualized skeletal
structures are unremarkable.
IMPRESSION: No active disease.

## 2022-02-06 MED ORDER — SODIUM CHLORIDE 0.9% FLUSH
10.0000 mL | Freq: Two times a day (BID) | INTRAVENOUS | Status: DC
  Administered 2022-02-07: 10 mL

## 2022-02-06 MED ORDER — IOHEXOL 300 MG/ML  SOLN
100.0000 mL | Freq: Once | INTRAMUSCULAR | Status: AC | PRN
  Administered 2022-02-06: 85 mL via INTRAVENOUS

## 2022-02-06 MED ORDER — CHLORHEXIDINE GLUCONATE CLOTH 2 % EX PADS
6.0000 | MEDICATED_PAD | Freq: Every day | CUTANEOUS | Status: DC
  Administered 2022-02-07: 6 via TOPICAL

## 2022-02-06 MED ORDER — ALUM & MAG HYDROXIDE-SIMETH 200-200-20 MG/5ML PO SUSP
30.0000 mL | Freq: Once | ORAL | Status: AC
Start: 2022-02-06 — End: 2022-02-06
  Administered 2022-02-06: 30 mL via ORAL
  Filled 2022-02-06: qty 30

## 2022-02-06 MED ORDER — SODIUM CHLORIDE 0.9% FLUSH: 10.0000 mL | INTRAVENOUS | Status: DC | PRN

## 2022-02-06 MED ORDER — LIDOCAINE VISCOUS HCL 2 % MT SOLN
15.0000 mL | Freq: Once | OROMUCOSAL | Status: AC
  Administered 2022-02-06: 15 mL via ORAL
  Filled 2022-02-06: qty 15

## 2022-02-06 MED ORDER — LACTATED RINGERS IV SOLN: INTRAVENOUS | Status: DC

## 2022-02-06 MED ORDER — FAMOTIDINE 20 MG PO TABS
40.0000 mg | ORAL_TABLET | Freq: Once | ORAL | Status: AC
  Administered 2022-02-06: 40 mg via ORAL
  Filled 2022-02-06: qty 2

## 2022-02-06 MED ORDER — LACTATED RINGERS IV BOLUS
1000.0000 mL | Freq: Once | INTRAVENOUS | Status: AC
  Administered 2022-02-06: 1000 mL via INTRAVENOUS

## 2022-02-06 NOTE — ED Notes (Signed)
Patient transported to CT 

## 2022-02-06 NOTE — ED Notes (Signed)
[facility] arrived to pick up pt. Pt stable at time of departure. Gave report to [facility].

## 2022-02-06 NOTE — ED Notes (Signed)
Report called to [facility]

## 2022-02-06 NOTE — H&P (Addendum)
History and Physical    Patient: [name] OVZ:[id number] DOB: March 08, 1946 DOA: 02/06/2022 DOS: the patient was seen and examined on 02/06/2022 PCP: [name], [name] G, MD  Patient coming from: [place]  Chief Complaint:  Chief Complaint  Patient presents with   Chest Pain    HPI: [name] is a 76 y.o. 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  Pt went for fasting labs this morning and had breakfast with bacon and eggs afterwards. Developed acute epigastric pain with numbness up both arms and presented to ED. Has associated nausea but no vomiting. No fever. No alcohol use.   In the ED, afebrile and hypertensive up to 170/90.  Had leukocytosis of 11.5.  Elevated creatinine of 1.37.  Lipase of greater than 3000.  Elevated AST of 67, normal ALT, elevated alkaline phosphatase. CT of the abdomen showing acute interstitial edematous pancreatitis with peripancreatic fluid collection.  Review of Systems: As mentioned in the history of present illness. All other systems reviewed and are negative. Past Medical History:  Diagnosis Date   A-fib ([place]) 03/04/2021   Anemia    Arthritis    Cancer (HCC)    prostate   Chronic kidney disease    blood in urine    Constipation    Coronary artery disease    Depression    Diabetes mellitus without complication ([place])    Difficult intubation    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 it was hard to get the tube down his throat   Dyspnea    Family history of breast cancer    Fatty liver    Fatty liver    Frequent headaches    GERD (gastroesophageal reflux disease)    History of chicken pox     History of fainting spells of unknown cause    History of prostate cancer    Hyperlipidemia    Hypertension    Myocardial infarction [facility])    Peripheral neuropathy    Pneumonia    Prostate cancer ([place])    PTSD (post-traumatic stress disorder)    Sleep apnea    uses Cpap   Past Surgical History:  Procedure Laterality Date   APPENDECTOMY     BIOPSY  01/15/2021   Procedure: BIOPSY;  Surgeon: Irving Copas., MD;  Location: [place];  Service: Gastroenterology;;   CARDIAC CATHETERIZATION  06/26/2017   CARDIAC SURGERY     Triple Bypass   CHOLECYSTECTOMY  2010   COLONOSCOPY     CORONARY ARTERY BYPASS GRAFT  2004   ESOPHAGOGASTRODUODENOSCOPY (EGD) WITH PROPOFOL N/A 01/15/2021   Procedure: ESOPHAGOGASTRODUODENOSCOPY (EGD) WITH PROPOFOL;  Surgeon: Irving Copas., MD;  Location: [facility] ENDOSCOPY;  Service: Gastroenterology;  Laterality: N/A;   EUS N/A 01/15/2021   Procedure: UPPER ENDOSCOPIC ULTRASOUND (EUS) RADIAL;  Surgeon: Irving Copas., MD;  Location: [place];  Service: Gastroenterology;  Laterality: N/A;   EYE SURGERY Bilateral    cataract removal   FINE NEEDLE ASPIRATION  01/15/2021   Procedure: FINE NEEDLE ASPIRATION (FNA) LINEAR;  Surgeon: Irving Copas., MD;  Location: [place];  Service: Gastroenterology;;   LAPAROSCOPY N/A 07/01/2021   Procedure: STAGING LAPAROSCOPY;  Surgeon: Dwan Bolt, MD;  Location: [place];  Service: General;  Laterality: N/A;   PORTACATH PLACEMENT Right 02/21/2021   Procedure: INSERTION PORT-A-CATH;  Surgeon: Dwan Bolt, MD;  Location: [place];  Service: General;  Laterality: Right;   SPLENECTOMY, 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   Procedure: SPLENECTOMY;  Surgeon: Dwan Bolt, MD;  Location: [place];  Service: General;  Laterality: N/A;   TONSILLECTOMY  1958   Social History:  reports that he quit smoking about 46 years ago. His smoking use included cigarettes. He has never used smokeless tobacco. He reports  that he does not currently use alcohol. He reports that he does not use drugs.  Allergies  Allergen Reactions   Keflex [Cephalexin] Other (See Comments)    Hallucinations?   Lisinopril Other (See Comments)    Unknown   Terazosin Other (See Comments)    Unknown    Family History  Problem Relation Age of Onset   Arthritis Mother    Heart disease Mother    Hyperlipidemia Mother    Hypertension Mother    Heart attack Mother    Breast cancer Sister        dx 53s   Heart attack Brother    Heart disease Brother    Depression Daughter    Arthritis Sister    Cancer Sister        unknown type, dx 56s   Cancer Brother    Learning disabilities Brother    Alcohol abuse Brother    COPD Brother    Heart attack Brother    Heart disease Brother     Prior to Admission medications   Medication Sig Start Date End Date Taking? Authorizing Provider  acetaminophen (TYLENOL) 325 MG tablet Take 2 tablets (650 mg total) by mouth every 6 (six) hours as needed for mild pain. Patient taking differently: Take 650 mg by mouth every 6 (six) hours as needed for mild pain or fever. 07/09/21  Yes Dwan Bolt, MD  amLODipine (NORVASC) 5 MG tablet Take 1 tablet (5 mg total) by mouth daily. 01/29/22 07/28/22 Yes Swinyer, Lanice Schwab, NP  apixaban (ELIQUIS) 5 MG TABS tablet Take 5 mg by mouth 2 (two) times daily.   Yes [provider]  atorvastatin (LIPITOR) 80 MG tablet Take 40 mg by mouth at bedtime. 09/22/19  Yes [provider]  calcium-vitamin D (OSCAL WITH D) 500-200 MG-UNIT tablet Take 1 tablet by mouth 2 (two) times daily.   Yes [provider]  clotrimazole-betamethasone (LOTRISONE) cream Apply 1 application topically daily as needed. 11/13/21  Yes [name], [name] G, MD  cycloSPORINE (RESTASIS) 0.05 % ophthalmic emulsion Place 1 drop into both eyes 2 (two) times daily as needed (dry eyes).   Yes [provider]  ferrous sulfate 325 (65 FE) MG tablet Take 1 tablet (325  mg total) by mouth daily with breakfast. 11/13/21  Yes [name], [name] G, MD  FLUoxetine (PROZAC) 20 MG capsule Take 40 mg by mouth daily.   Yes [provider]  furosemide (LASIX) 20 MG tablet Take 1 tablet (20 mg total) by mouth daily. 01/29/22 04/29/22 Yes Swinyer, Lanice Schwab, NP  insulin glargine (LANTUS) 100 UNIT/ML injection Inject 0.3 mLs (30 Units total) into the skin daily. Patient taking differently: Inject 50 Units into the skin every morning. 07/09/21  Yes Dwan Bolt, MD  Multiple Vitamin (MULTI-VITAMINS) TABS Take 1 tablet by mouth daily.   Yes [provider]  nystatin powder Apply 1 application topically 3 (three) times daily. 11/13/21  Yes [name], [name] G,  MD  pantoprazole (PROTONIX) 40 MG tablet Take 1 tablet (40 mg total) by mouth daily. 07/15/21  Yes [name], [name] G, MD  potassium chloride SA (KLOR-CON M) 20 MEQ tablet TAKE 1 TABLET BY MOUTH EVERY DAY 01/13/22  Yes Ladell Pier, MD  tamsulosin (FLOMAX) 0.4 MG CAPS capsule Take 0.8 mg by mouth at bedtime. 03/27/21  Yes [provider]  traZODone (DESYREL) 100 MG tablet Take 100 mg by mouth at bedtime.   Yes [provider]  valsartan (DIOVAN) 320 MG tablet Take 1 tablet (320 mg total) by mouth daily. 01/29/22  Yes Swinyer, Lanice Schwab, NP  vitamin B-12 (CYANOCOBALAMIN) 1000 MCG tablet Take 1,000 mcg by mouth daily.   Yes [provider]  doxycycline (VIBRA-TABS) 100 MG tablet Take 1 tablet (100 mg total) by mouth 2 (two) times daily. Patient not taking: Reported on 01/29/2022 10/22/21   Marybelle Killings, MD  glucose blood [facility] VERIO) test strip Use to test blood sugars 1-2 times daily. 07/26/21   [name], [name] G, MD    Physical Exam: Vitals:   02/06/22 1900 02/06/22 2000 02/06/22 2100 02/06/22 2223  BP: (!) 177/88 (!) 179/91 (!) 162/79 (!) 170/110  Pulse: 93 92 91 88  Resp: _0 Temp:    100.2 F (37.9 C)  TempSrc:    Oral  SpO2: 95% 92% 93% 96%  Weight:    107.9 kg   Height:    _1  (1.753 m)  Constitutional: NAD, calm, comfortable, elderly male laying flat in bed Eyes: lids and conjunctivae normal ENMT: Mucous membranes are moist.   Neck: normal, supple Respiratory: clear to auscultation bilaterally, no wheezing, no crackles. Normal respiratory effort.  Cardiovascular: Regular rate and rhythm, no murmurs / rubs / gallops. No extremity edema.  +3 pitting edema bilateral lower extremity up to knee. Abdomen: no tenderness, no masses palpated. Bowel sounds positive.  Musculoskeletal: no clubbing / cyanosis. No joint deformity upper and lower extremities. Good ROM, no contractures. Normal muscle tone.  Skin: no rashes, lesions, ulcers. No induration Neurologic: CN 2-12 grossly intact. Strength 5/5 in all 4.  Psychiatric: Normal judgment and insight. Alert and oriented x 3. Normal mood.  Data Reviewed:  See HPI  Assessment and Plan: Acute pancreatitis  -febrile with lipase of 3189. CT abdomen and pelvis showing acute interstitial edematous pancreatitis with small 1.6 cm acute peripancreatic fluid collection. -unclear etiology but pt does have hx of pancreatic cancer in remission. Recommend follow up imaging outpatient. Will check triglycerides .  -Continues aggressive IV fluid hydration- monitor volume status closely as he has significant LE edema on exam and was recently started on Lasix 44m by cardiology last week. No other symptoms of CHF.  -advance diet as tolerated   AKI on CKD stage 3 -Creatinine elevated to 1.37 from 1.15 -Monitor repeat creatinine with IV fluid.  Avoid nephrotoxic agent.  HTN Elevated and recently had Lasix added by cardiology. Will need to hold while getting fluids. Hold ACE-I for AKI Continue amlopidine PRN IV labetolol 552mfor systolic greater than 18356nd/or diastolic greater than 11861Abnormal LFTs AST mildly elevated 67, normal ALT of 34, alk phos elevated at 224.  Likely secondary to pancreatitis.  Continue to  follow with repeat CMP.  CAD s/p bypass Continue Eliquis, statin  poorly differentiated carcinoma diagnosed on 01/2021 with final chemotherapy on 10/29/2021 in remission -follows with Dr. ShBenay Spice Paroxysmal atrial fibrillation Continue Eliquis  Type 2 diabetes Last hemoglobin  A1c of 6.9 in January Home regimen of 50 units Lantus each morning. Start with moderate SSI while he has low appetite  OSA Continue nightly CPAP   Advance Care Planning:   Code Status: Prior Full  Consults: none  Family Communication: no family at bedside  Severity of Illness: The appropriate patient status for this patient is INPATIENT. Inpatient status is judged to be reasonable and necessary in order to provide the required intensity of service to ensure the patient's safety. The patient's presenting symptoms, physical exam findings, and initial radiographic and laboratory data in the context of their chronic comorbidities is felt to place them at high risk for further clinical deterioration. Furthermore, it is not anticipated that the patient will be medically stable for discharge from the hospital within 2 midnights of admission.   * I certify that at the point of admission it is my clinical judgment that the patient will require inpatient hospital care spanning beyond 2 midnights from the point of admission due to high intensity of service, high risk for further deterioration and high frequency of surveillance required.*  Author: Orene Desanctis, DO 02/06/2022 10:32 PM  For on call review [email or web address].

## 2022-02-06 NOTE — ED Notes (Signed)
Called [facility] to transport patient to [facility] [phone]

## 2022-02-06 NOTE — ED Provider Notes (Addendum)
[facility] EMERGENCY DEPT Provider Note   CSN: [id number] Arrival date & time: 02/06/22  1143     History  Chief Complaint  Patient presents with   Chest Pain    [name] is a 76 y.o. male.  76 year old male presents to cute onset of epigastric pain that began after he had a very large meal.  Patient has a history of pancreatic surgery.  He also has history of CAD.  Patient states that shortly after eating he developed epigastric discomfort feeling like indigestion.  Denied any diaphoresis or dyspnea.  Felt like he wanted to burp.  Did take 2 nitroglycerin without relief.  Did have some numbness in both his arms but was hyperventilating.      Home Medications Prior to Admission medications   Medication Sig Start Date End Date Taking? Authorizing Provider  acetaminophen (TYLENOL) 325 MG tablet Take 2 tablets (650 mg total) by mouth every 6 (six) hours as needed for mild pain. Patient taking differently: Take 650 mg by mouth every 6 (six) hours as needed for mild pain or fever. 07/09/21   Dwan Bolt, MD  amLODipine (NORVASC) 5 MG tablet Take 1 tablet (5 mg total) by mouth daily. 01/29/22 07/28/22  Swinyer, Lanice Schwab, NP  apixaban (ELIQUIS) 5 MG TABS tablet Take 5 mg by mouth 2 (two) times daily.    [provider]  atorvastatin (LIPITOR) 80 MG tablet Take 40 mg by mouth at bedtime. 09/22/19   [provider]  calcium-vitamin D (OSCAL WITH D) 500-200 MG-UNIT tablet Take 1 tablet by mouth 2 (two) times daily.    [provider]  clotrimazole-betamethasone (LOTRISONE) cream Apply 1 application topically daily as needed. 11/13/21   [name], [name] G, MD  cycloSPORINE (RESTASIS) 0.05 % ophthalmic emulsion Place 1 drop into both eyes 2 (two) times daily as needed (dry eyes).    [provider]  doxycycline (VIBRA-TABS) 100 MG tablet Take 1 tablet (100 mg total) by mouth 2 (two) times daily. Patient not taking: Reported on  01/29/2022 10/22/21   Marybelle Killings, MD  ferrous sulfate 325 (65 FE) MG tablet Take 1 tablet (325 mg total) by mouth daily with breakfast. 11/13/21   [name], [name] G, MD  FLUoxetine (PROZAC) 20 MG capsule Take 40 mg by mouth daily.    [provider]  furosemide (LASIX) 20 MG tablet Take 1 tablet (20 mg total) by mouth daily. 01/29/22 04/29/22  Swinyer, Lanice Schwab, NP  glucose blood (ONETOUCH VERIO) test strip Use to test blood sugars 1-2 times daily. 07/26/21   [name], [name] G, MD  insulin glargine (LANTUS) 100 UNIT/ML injection Inject 0.3 mLs (30 Units total) into the skin daily. Patient taking differently: Inject 50 Units into the skin every morning. 07/09/21   Dwan Bolt, MD  Multiple Vitamin (MULTI-VITAMINS) TABS Take 1 tablet by mouth daily.    [provider]  nystatin powder Apply 1 application topically 3 (three) times daily. 11/13/21   [name], [name] G, MD  pantoprazole (PROTONIX) 40 MG tablet Take 1 tablet (40 mg total) by mouth daily. 07/15/21   [name], [name] G, MD  potassium chloride SA (KLOR-CON M) 20 MEQ tablet TAKE 1 TABLET BY MOUTH EVERY DAY 01/13/22   Ladell Pier, MD  tamsulosin (FLOMAX) 0.4 MG CAPS capsule Take 0.8 mg by mouth at bedtime. 03/27/21   [provider]  traZODone (DESYREL) 100 MG tablet Take 100 mg by mouth at bedtime.    [provider]  valsartan (DIOVAN) 320 MG tablet Take 1 tablet (320 mg total) by mouth daily. 01/29/22   Swinyer, Lanice Schwab, NP  vitamin B-12 (CYANOCOBALAMIN) 1000 MCG tablet Take 1,000 mcg by mouth daily.    [provider]      Allergies    Furosemide, Keflex [cephalexin], Lisinopril, and Terazosin    Review of Systems   Review of Systems  All other systems reviewed and are negative.  Physical Exam Updated Vital Signs Temp 97.6 F (36.4 C) (Oral)    Ht 1.753 m (5\' 9" )    Wt 108.9 kg    BMI 35.44 kg/m  Physical Exam Vitals and nursing note reviewed.  Constitutional:      General: He is not  in acute distress.    Appearance: Normal appearance. He is well-developed. He is not toxic-appearing.  HENT:     Head: Normocephalic and atraumatic.  Eyes:     General: Lids are normal.     Conjunctiva/sclera: Conjunctivae normal.     Pupils: Pupils are equal, round, and reactive to light.  Neck:     Thyroid: No thyroid mass.     Trachea: No tracheal deviation.  Cardiovascular:     Rate and Rhythm: Normal rate and regular rhythm.     Heart sounds: Normal heart sounds. No murmur heard.   No gallop.  Pulmonary:     Effort: Pulmonary effort is normal. No respiratory distress.     Breath sounds: Normal breath sounds. No stridor. No decreased breath sounds, wheezing, rhonchi or rales.  Abdominal:     General: There is no distension.     Palpations: Abdomen is soft.     Tenderness: There is abdominal tenderness in the epigastric area. There is no rebound.    Musculoskeletal:        General: No tenderness. Normal range of motion.     Cervical back: Normal range of motion and neck supple.  Skin:    General: Skin is warm and dry.     Findings: No abrasion or rash.  Neurological:     Mental Status: He is alert and oriented to person, place, and time. Mental status is at baseline.     GCS: GCS eye subscore is 4. GCS verbal subscore is 5. GCS motor subscore is 6.     Cranial Nerves: No cranial nerve deficit.     Sensory: No sensory deficit.     Motor: Motor function is intact.  Psychiatric:        Attention and Perception: Attention normal.        Speech: Speech normal.        Behavior: Behavior normal.    ED Results / Procedures / Treatments   Labs (all labs ordered are listed, but only abnormal results are displayed) Labs Reviewed  CBC  LIPASE, BLOOD  COMPREHENSIVE METABOLIC PANEL  TROPONIN I (HIGH SENSITIVITY)    EKG EKG Interpretation  Date/Time:  Thursday February 06 2022 11:48:05 EST Ventricular Rate:  61 PR Interval:  188 QRS Duration: 68 QT Interval:  410 QTC  Calculation: 412 R Axis:   144 Text Interpretation: Normal sinus rhythm Right axis deviation Low voltage QRS Cannot rule out Anterior infarct , age undetermined Abnormal ECG When compared with ECG of 22-Sep-2021 12:51, PREVIOUS ECG IS PRESENT No significant change since last tracing Confirmed by Lacretia Leigh ([id number]) on 02/06/2022 11:52:56 AM  Radiology No results found.  Procedures Procedures    Medications Ordered in ED Medications  famotidine (PEPCID)  tablet 40 mg (has no administration in time range)  alum & mag hydroxide-simeth (MAALOX/MYLANTA) 200-200-20 MG/5ML suspension 30 mL (has no administration in time range)    And  lidocaine (XYLOCAINE) 2 % viscous mouth solution 15 mL (has no administration in time range)    ED Course/ Medical Decision Making/ A&P                           Medical Decision Making Amount and/or Complexity of Data Reviewed Labs: ordered. Radiology: ordered.  Risk OTC drugs. Prescription drug management.  Visit done with wife in the room.  Old chart review from outside sources. Patient presented with severe abdominal discomfort and treated with GI cocktail and Pepcid and does feel better.  Considered ACS as well and EKG per my interpretation shows no signs of coronary ischemia.  Patient negative troponins x2.  Renal function shows elevation likely due to some element of dehydration.  We will give IV hydration for that.  Patient given IV hydration here.  Laboratory studies reviewed and patient with elevated lipase consistent with pancreatitis.  This goes in line with the fatty meal that he had.  Patient has history of pancreatic cancer and has had 2 doses pancreas removed.  Patient was given IV fluids here and will be admitted to the hospitalist service        Final Clinical Impression(s) / ED Diagnoses Final diagnoses:  None    Rx / DC Orders ED Discharge Orders     None         Lacretia Leigh, MD 02/06/22 1531    Lacretia Leigh,  MD 02/06/22 [phone]

## 2022-02-06 NOTE — ED Notes (Signed)
Pt up to restroom via wheelchair, wife present.

## 2022-02-06 NOTE — ED Notes (Signed)
Attempted to call report to [facility], nurse states bed has not been approved from [facility]

## 2022-02-06 NOTE — ED Notes (Signed)
Back from CT, tech assisted pt to BR.

## 2022-02-06 NOTE — Telephone Encounter (Signed)
BP readings: 2/16 165/78, HR 67 2/17 165/79, 69 2/18 173/82, 69 2/19 176/76, 72 2/20 173/81, 75 2/21 167/77, 75 2/22 168/75, 74  Patient brought him home BP and HR readings today. BP remains elevated, however he is currently admitted to ED. Will call patient at a later time to check on him. Started Lasix 20 mg daily at last ov. Will likely recommend addition of hydralazine 25 mg 3 times daily.

## 2022-02-06 NOTE — Progress Notes (Signed)
Plan of Care Note for accepted transfer  Patient: [name]    IRC:[id number]  DOA: 02/06/2022     Facility requesting transfer: [facility] Requesting Provider: Dr. Langston Masker Reason for transfer: Admission for acute pancreatitis Facility course:  76 year old male with past medical history of CAD SP CABG, PAF, type II DM, HLD, poorly differentiated pancreatic cancer follows up with Dr. 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  Work-up for ACS was unremarkable including elevated but stable troponin x2 and EKG which was nonischemic. Lipase level was 3000. CT abdomen performed shows evidence of pancreatitis without any necrosis.  Plan of care: The patient is accepted for admission to Telemetry unit, at [facility]. ED provider will continue to manage patient while they are at [facility].  Author: Berle Mull, MD  02/06/2022  Check [email or web address] for on-call coverage.  Nursing staff, Please call [place] number on Amion as soon as patient's arrival, so appropriate admitting provider can evaluate the pt.

## 2022-02-06 NOTE — ED Notes (Signed)
Pt has no complaints of pain, BP elevated last few cycles, ED provider messaged

## 2022-02-06 NOTE — ED Notes (Signed)
No c/o CP at this time, wife at bedside, lab called requesting results of CMP and Lipase

## 2022-02-06 NOTE — ED Triage Notes (Signed)
Pt came in [place] with wife c/o of CP about 60min ago. Pt has had 2-nitro PTA with no relief. Pt complaining of numbness in his arms bilaterally. Pt complains of the pain being pressure.

## 2022-02-07 ENCOUNTER — Encounter (HOSPITAL_COMMUNITY): Payer: Self-pay | Admitting: Internal Medicine

## 2022-02-07 ENCOUNTER — Encounter: Payer: Self-pay | Admitting: Oncology

## 2022-02-07 DIAGNOSIS — Z8507 Personal history of malignant neoplasm of pancreas: Secondary | ICD-10-CM

## 2022-02-07 DIAGNOSIS — K859 Acute pancreatitis without necrosis or infection, unspecified: Secondary | ICD-10-CM | POA: Diagnosis not present

## 2022-02-07 DIAGNOSIS — G4733 Obstructive sleep apnea (adult) (pediatric): Secondary | ICD-10-CM

## 2022-02-07 DIAGNOSIS — R1013 Epigastric pain: Secondary | ICD-10-CM | POA: Diagnosis not present

## 2022-02-07 DIAGNOSIS — N179 Acute kidney failure, unspecified: Secondary | ICD-10-CM

## 2022-02-07 DIAGNOSIS — N189 Chronic kidney disease, unspecified: Secondary | ICD-10-CM

## 2022-02-07 LAB — COMPREHENSIVE METABOLIC PANEL
ALT: 103 U/L — ABNORMAL HIGH (ref 0–44)
AST: 143 U/L — ABNORMAL HIGH (ref 15–41)
Albumin: 2.8 g/dL — ABNORMAL LOW (ref 3.5–5.0)
Alkaline Phosphatase: 289 U/L — ABNORMAL HIGH (ref 38–126)
Anion gap: 4 — ABNORMAL LOW (ref 5–15)
BUN: 27 mg/dL — ABNORMAL HIGH (ref 8–23)
CO2: 28 mmol/L (ref 22–32)
Calcium: 8.6 mg/dL — ABNORMAL LOW (ref 8.9–10.3)
Chloride: 105 mmol/L (ref 98–111)
Creatinine, Ser: 1.29 mg/dL — ABNORMAL HIGH (ref 0.61–1.24)
GFR, Estimated: 57 mL/min — ABNORMAL LOW (ref >60)
Glucose, Bld: 125 mg/dL — ABNORMAL HIGH (ref 70–99)
Potassium: 3.6 mmol/L (ref 3.5–5.1)
Sodium: 137 mmol/L (ref 135–145)
Total Bilirubin: 0.6 mg/dL (ref 0.3–1.2)
Total Protein: 5.9 g/dL — ABNORMAL LOW (ref 6.5–8.1)

## 2022-02-07 LAB — GLUCOSE, CAPILLARY
Glucose-Capillary: 81 mg/dL (ref 70–99)
Glucose-Capillary: 115 mg/dL — ABNORMAL HIGH (ref 70–99)
Glucose-Capillary: 212 mg/dL — ABNORMAL HIGH (ref 70–99)

## 2022-02-07 LAB — BASIC METABOLIC PANEL
BUN/Creatinine Ratio: 24 (ref 10–24)
BUN: 33 mg/dL — ABNORMAL HIGH (ref 8–27)
CO2: 24 mmol/L (ref 20–29)
Calcium: 9.6 mg/dL (ref 8.6–10.2)
Chloride: 102 mmol/L (ref 96–106)
Creatinine, Ser: 1.36 mg/dL — ABNORMAL HIGH (ref 0.76–1.27)
Glucose: 114 mg/dL — ABNORMAL HIGH (ref 70–99)
Potassium: 4.3 mmol/L (ref 3.5–5.2)
Sodium: 140 mmol/L (ref 134–144)
eGFR: 54 mL/min/{1.73_m2} — ABNORMAL LOW (ref >59)

## 2022-02-07 LAB — CBC
HCT: 34.9 % — ABNORMAL LOW (ref 39.0–52.0)
Hemoglobin: 11.8 g/dL — ABNORMAL LOW (ref 13.0–17.0)
MCH: 32.2 pg (ref 26.0–34.0)
MCHC: 33.8 g/dL (ref 30.0–36.0)
MCV: 95.1 fL (ref 80.0–100.0)
Platelets: 238 10*3/uL (ref 150–400)
RBC: 3.67 MIL/uL — ABNORMAL LOW (ref 4.22–5.81)
RDW: 15.4 % (ref 11.5–15.5)
WBC: 7.7 10*3/uL (ref 4.0–10.5)
nRBC: 0 % (ref 0.0–0.2)

## 2022-02-07 LAB — LIPASE, BLOOD: Lipase: 170 U/L — ABNORMAL HIGH (ref 11–51)

## 2022-02-07 LAB — TRIGLYCERIDES: Triglycerides: 53 mg/dL (ref <150)

## 2022-02-07 MED ORDER — HEPARIN SOD (PORK) LOCK FLUSH 100 UNIT/ML IV SOLN
500.0000 [IU] | INTRAVENOUS | Status: AC | PRN
  Administered 2022-02-07: 500 [IU]
  Filled 2022-02-07: qty 5

## 2022-02-07 MED ORDER — LABETALOL HCL 5 MG/ML IV SOLN
5.0000 mg | INTRAVENOUS | Status: DC | PRN
  Administered 2022-02-07: 5 mg via INTRAVENOUS
  Filled 2022-02-07: qty 4

## 2022-02-07 MED ORDER — ACETAMINOPHEN 325 MG PO TABS
650.0000 mg | ORAL_TABLET | Freq: Four times a day (QID) | ORAL | Status: DC | PRN
  Administered 2022-02-07: 650 mg via ORAL
  Filled 2022-02-07: qty 2

## 2022-02-07 MED ORDER — AMLODIPINE BESYLATE 5 MG PO TABS
5.0000 mg | ORAL_TABLET | Freq: Every day | ORAL | Status: DC
  Administered 2022-02-07: 5 mg via ORAL
  Filled 2022-02-07: qty 1

## 2022-02-07 MED ORDER — APIXABAN 5 MG PO TABS
5.0000 mg | ORAL_TABLET | Freq: Two times a day (BID) | ORAL | Status: DC
Start: 2022-02-07 — End: 2022-02-08
  Administered 2022-02-07 (×2): 5 mg via ORAL
  Filled 2022-02-07 (×2): qty 1

## 2022-02-07 MED ORDER — POTASSIUM CHLORIDE CRYS ER 20 MEQ PO TBCR
20.0000 meq | EXTENDED_RELEASE_TABLET | Freq: Every day | ORAL | Status: DC
  Administered 2022-02-07: 20 meq via ORAL
  Filled 2022-02-07: qty 1

## 2022-02-07 MED ORDER — INSULIN ASPART 100 UNIT/ML IJ SOLN
0.0000 [IU] | Freq: Three times a day (TID) | INTRAMUSCULAR | Status: DC
  Administered 2022-02-07: 5 [IU] via SUBCUTANEOUS

## 2022-02-07 MED ORDER — ATORVASTATIN CALCIUM 40 MG PO TABS
40.0000 mg | ORAL_TABLET | Freq: Every day | ORAL | Status: DC
  Administered 2022-02-07: 40 mg via ORAL
  Filled 2022-02-07: qty 1

## 2022-02-07 MED ORDER — PANTOPRAZOLE SODIUM 40 MG PO TBEC
40.0000 mg | DELAYED_RELEASE_TABLET | Freq: Every day | ORAL | Status: DC
  Administered 2022-02-07: 40 mg via ORAL
  Filled 2022-02-07: qty 1

## 2022-02-07 MED ORDER — TAMSULOSIN HCL 0.4 MG PO CAPS
0.8000 mg | ORAL_CAPSULE | Freq: Every day | ORAL | Status: DC
  Administered 2022-02-07: 0.8 mg via ORAL
  Filled 2022-02-07: qty 2

## 2022-02-07 MED ORDER — FLUOXETINE HCL 20 MG PO CAPS
40.0000 mg | ORAL_CAPSULE | Freq: Every day | ORAL | Status: DC
  Administered 2022-02-07: 40 mg via ORAL
  Filled 2022-02-07: qty 2

## 2022-02-07 MED ORDER — TRAZODONE HCL 100 MG PO TABS
100.0000 mg | ORAL_TABLET | Freq: Every day | ORAL | Status: DC
  Administered 2022-02-07: 100 mg via ORAL
  Filled 2022-02-07: qty 1

## 2022-02-07 MED ORDER — FERROUS SULFATE 325 (65 FE) MG PO TABS
325.0000 mg | ORAL_TABLET | Freq: Every day | ORAL | Status: DC
  Administered 2022-02-07: 325 mg via ORAL
  Filled 2022-02-07: qty 1

## 2022-02-07 MED ORDER — VITAMIN B-12 1000 MCG PO TABS
1000.0000 ug | ORAL_TABLET | Freq: Every day | ORAL | Status: DC
  Administered 2022-02-07: 1000 ug via ORAL
  Filled 2022-02-07: qty 1

## 2022-02-07 NOTE — Progress Notes (Signed)
°  Transition of Care [facility]) Screening Note   Patient Details  Name: [name] Date of Birth: 05-08-1946   Transition of Care [facility]) CM/SW Contact:    Dessa Phi, RN Phone Number: 02/07/2022, 9:58 AM    Transition of Care Department [facility]) has reviewed patient and no TOC needs have been identified at this time. We will continue to monitor patient advancement through interdisciplinary progression rounds. If new patient transition needs arise, please place a TOC consult.

## 2022-02-07 NOTE — Plan of Care (Signed)
Problem: Education: Goal: Knowledge of General Education information will improve Description: Including pain rating scale, medication(s)/side effects and non-pharmacologic comfort measures Outcome: Completed/Met   Problem: Health Behavior/Discharge Planning: Goal: Ability to manage health-related needs will improve Outcome: Completed/Met   Problem: Clinical Measurements: Goal: Ability to maintain clinical measurements within normal limits will improve Outcome: Completed/Met Goal: Will remain free from infection Outcome: Completed/Met Goal: Diagnostic test results will improve Outcome: Completed/Met Goal: Respiratory complications will improve Outcome: Completed/Met Goal: Cardiovascular complication will be avoided Outcome: Completed/Met   Problem: Activity: Goal: Risk for activity intolerance will decrease Outcome: Completed/Met   Problem: Nutrition: Goal: Adequate nutrition will be maintained Outcome: Completed/Met   Problem: Coping: Goal: Level of anxiety will decrease Outcome: Completed/Met   Problem: Elimination: Goal: Will not experience complications related to bowel motility Outcome: Completed/Met Goal: Will not experience complications related to urinary retention Outcome: Completed/Met   Problem: Pain Managment: Goal: General experience of comfort will improve Outcome: Completed/Met   Problem: Safety: Goal: Ability to remain free from injury will improve Outcome: Completed/Met   Problem: Skin Integrity: Goal: Risk for impaired skin integrity will decrease Outcome: Completed/Met   Problem: Education: Goal: Knowledge of Pancreatitis treatment and prevention will improve Outcome: Completed/Met   Problem: Nutritional: Goal: Ability to achieve adequate nutritional intake will improve Outcome: Completed/Met   Problem: Clinical Measurements: Goal: Complications related to the disease process, condition or treatment will be avoided or  minimized Outcome: Completed/Met

## 2022-02-07 NOTE — Discharge Summary (Signed)
Physician Discharge Summary   Patient: [name] MRN: [id number] DOB: 10-23-1946  Admit date:     02/06/2022  Discharge date: 02/07/22  Discharge Physician: Marylu Lund   PCP: [name], [name] G, MD   Recommendations at discharge:    Follow up with PCP in 1-2 weeks Follow up with Dr. Benay Spice as scheduled Recommend repeat LFT's in 1 week  Discharge Diagnoses: Principal Problem:   Acute pancreatitis Active Problems:   DM (diabetes mellitus), type 2 with renal complications ([place])   Hypertension with heart disease   Hx of CABG   Atrial fibrillation (HCC)   Acute-on-chronic kidney injury ([place])   OSA (obstructive sleep apnea)   History of pancreatic cancer  Resolved Problems:   * No resolved hospital problems. *   Hospital Course: 76 y.o. 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  Assessment and Plan: No notes have been filed under this hospital service. Service: Hospitalist  Acute pancreatitis  -presented febrile with lipase of 3189. CT abdomen and pelvis showing acute interstitial edematous pancreatitis with small 1.6 cm acute peripancreatic fluid collection. -unclear etiology but pt does have hx of pancreatic cancer in remission.  -Much improved the following morning with lipase down to 170 -Pt tolerated an advanced diet without issues -Recommend close follow up with repeat LFT's in one week   AKI on CKD stage 3 -Creatinine elevated to 1.37 from 1.15 -Cr improved with IVF hydration   HTN Initially held ACE-I for AKI Continued amlopidine Resume home meds on d/c   Abnormal LFTs AST mildly elevated 67, normal ALT of  34, alk phos elevated at 224.  Likely secondary to pancreatitis.  Clinically much improved. No abd pain CT without evidence of biliary obstruction noted Recommend repeat LFT's in one week   CAD s/p bypass Continue Eliquis, statin   poorly differentiated carcinoma diagnosed on 01/2021 with final chemotherapy on 10/29/2021 in remission -follows with Dr. Benay Spice    Paroxysmal atrial fibrillation Continue Eliquis   Type 2 diabetes Last hemoglobin A1c of 6.9 in January Cont home regimen on d/c   OSA Continue nightly CPAP           Consultants: Procedures performed:   Disposition: Home Diet recommendation:  Carb modified diet  DISCHARGE MEDICATION: Allergies as of 02/07/2022       Reactions   Keflex [cephalexin] Other (See Comments)   Hallucinations?   Lisinopril Other (See Comments)   Unknown   Terazosin Other (See Comments)   Unknown        Medication List     STOP taking these medications    doxycycline 100 MG tablet Commonly known as: VIBRA-TABS       TAKE these medications    acetaminophen 325 MG tablet Commonly known as: TYLENOL Take 2 tablets (650 mg total) by mouth every 6 (six) hours as needed for mild pain. What changed: reasons to take this   amLODipine 5 MG tablet Commonly known as: NORVASC Take 1 tablet (5 mg total) by mouth daily.   atorvastatin 80 MG tablet Commonly known as: LIPITOR Take 40 mg by mouth at bedtime.   calcium-vitamin D 500-200 MG-UNIT tablet Commonly known as: OSCAL WITH D Take 1 tablet by  mouth 2 (two) times daily.   clotrimazole-betamethasone cream Commonly known as: LOTRISONE Apply 1 application topically daily as needed.   cycloSPORINE 0.05 % ophthalmic emulsion Commonly known as: RESTASIS Place 1 drop into both eyes 2 (two) times daily as needed (dry eyes).   Eliquis 5 MG Tabs tablet Generic drug: apixaban Take 5 mg by mouth 2 (two) times daily.   ferrous sulfate 325 (65 FE) MG tablet Take 1 tablet  (325 mg total) by mouth daily with breakfast.   FLUoxetine 20 MG capsule Commonly known as: PROZAC Take 40 mg by mouth daily.   furosemide 20 MG tablet Commonly known as: LASIX Take 1 tablet (20 mg total) by mouth daily.   insulin glargine 100 UNIT/ML injection Commonly known as: LANTUS Inject 0.3 mLs (30 Units total) into the skin daily. What changed:  how much to take when to take this   Multi-Vitamins Tabs Take 1 tablet by mouth daily.   nystatin powder Commonly known as: nystatin Apply 1 application topically 3 (three) times daily.   OneTouch Verio test strip Generic drug: glucose blood Use to test blood sugars 1-2 times daily.   pantoprazole 40 MG tablet Commonly known as: Protonix Take 1 tablet (40 mg total) by mouth daily.   potassium chloride SA 20 MEQ tablet Commonly known as: KLOR-CON M TAKE 1 TABLET BY MOUTH EVERY DAY   tamsulosin 0.4 MG Caps capsule Commonly known as: FLOMAX Take 0.8 mg by mouth at bedtime.   traZODone 100 MG tablet Commonly known as: DESYREL Take 100 mg by mouth at bedtime.   valsartan 320 MG tablet Commonly known as: DIOVAN Take 1 tablet (320 mg total) by mouth daily.   vitamin B-12 1000 MCG tablet Commonly known as: CYANOCOBALAMIN Take 1,000 mcg by mouth daily.        Follow-up Information     [name], [name] G, MD Follow up in 2 week(s).   Specialty: Family Medicine Why: Hospital follow up Contact information: [place] [address] [phone]         [name], Peter M, MD .   Specialty: Cardiology Contact information: [address] [place] [address] [phone]         Ladell Pier, MD Follow up.   Specialty: Oncology Why: as scheduled Contact information: [place] [address] [phone]                 Discharge Exam: Danley Danker Weights   02/06/22 1148 02/06/22 2223  Weight: 108.9 kg 107.9 kg   General exam: Awake,  laying in bed, in nad Respiratory system: Normal respiratory effort, no wheezing Cardiovascular system: regular rate, s1, s2 Gastrointestinal system: Soft, nondistended, positive BS Central nervous system: CN2-12 grossly intact, strength intact Extremities: Perfused, no clubbing Skin: Normal skin turgor, no notable skin lesions seen Psychiatry: Mood normal // no visual hallucinations   Condition at discharge: good  The results of significant diagnostics from this hospitalization (including imaging, microbiology, ancillary and laboratory) are listed below for reference.   Imaging Studies: CT ABDOMEN PELVIS W CONTRAST  Result Date: 02/06/2022 CLINICAL DATA:  Chest pain. Evaluate for pancreatitis. History of open distal pancreatectomy and splenectomy July 2022 for pancreatic adenocarcinoma. EXAM: CT ABDOMEN AND PELVIS WITH CONTRAST TECHNIQUE: Multidetector CT imaging of the abdomen and pelvis was performed using the standard protocol following bolus administration of intravenous contrast. RADIATION DOSE REDUCTION: This exam was performed according to the departmental dose-optimization program which includes automated exposure  control, adjustment of the mA and/or kV according to patient size and/or use of iterative reconstruction technique. CONTRAST:  81m OMNIPAQUE IOHEXOL 300 MG/ML  SOLN COMPARISON:  None. FINDINGS: Lower chest: No acute abnormality. Partially visualized coronary artery stent. Hepatobiliary: No focal liver abnormality is seen. Status post cholecystectomy. No biliary dilatation. Pancreas: Interval postoperative changes of distal pancreatectomy. The pancreatic head is edematous with mild peripancreatic fat stranding. A 1.2 x 1.6 x 1.2 cm oval hypodensity is noted within the superior aspect of the head of the pancreas (series 2, image 28; series 6, image 67). Spleen: Surgically absent. Adrenals/Urinary Tract: Unchanged 2.4 cm left adrenal adenoma (series 2, image 26). Right adrenal gland  is normal. No hydronephrosis. Subcentimeter cortical simple cyst at the upper pole of the left kidney (series 2, image 34). Mildly distended urinary bladder is unremarkable. Stomach/Bowel: Stomach is within normal limits. Mild wall thickening of the second portion duodenum secondary to inflammatory changes at the head of the pancreas. The appendix is surgically absent. No bowel obstruction. Vascular/Lymphatic: Aortic atherosclerosis. No enlarged abdominal or pelvic lymph nodes. Reproductive: Prostatomegaly with mass effect on the inferior aspect of the urinary bladder. Other: Interval development of fat necrosis within the anterior aspect of the upper to mid abdomen directly underlying surgical scar at the midline abdomen, as well as within the left upper quadrant. No free intraperitoneal fluid. Musculoskeletal: No acute or suspicious osseous findings. Multilevel degenerative disc disease throughout the thoracic and lumbar spine, most severe at L4-L5 and L5-S1. Bilateral pars defect at L5-S1 with grade 1 anterolisthesis. IMPRESSION: 1. Acute interstitial edematous pancreatitis with small 1.6 cm acute peripancreatic fluid collection within the superior aspect of the pancreatic head. 2. 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 3. Stable benign left adrenal adenoma. 4. Prostatomegaly. 5.  Aortic Atherosclerosis (ICD10-I70.0). Electronically Signed   By: LIleana RoupM.D.   On: 02/06/2022 16:59   DG Chest Portable 1 View  Result Date: 02/06/2022 CLINICAL DATA:  Chest pain beginning today. Coronary artery disease. Prostate carcinoma. EXAM: PORTABLE CHEST 1 VIEW COMPARISON:  09/22/2021 FINDINGS: The heart size and mediastinal contours are within normal limits. Right-sided Port-A-Cath remains in appropriate position. Prior CABG again noted. Both lungs are clear. The visualized skeletal structures are unremarkable. IMPRESSION: No active disease. Electronically Signed   By: JMarlaine Hind M.D.   On: 02/06/2022 12:21    Microbiology: Results for orders placed or performed during the hospital encounter of 02/06/22  Resp Panel by RT-PCR (Flu A&B, Covid) Nasopharyngeal Swab     Status: None   Collection Time: 02/06/22  6:44 PM   Specimen: Nasopharyngeal Swab; Nasopharyngeal(NP) swabs in vial transport medium  Result Value Ref Range Status   SARS Coronavirus 2 by RT PCR NEGATIVE NEGATIVE Final    Comment: (NOTE) SARS-CoV-2 target nucleic acids are NOT DETECTED.  The SARS-CoV-2 RNA is generally detectable in upper respiratory specimens during the acute phase of infection. The lowest concentration of SARS-CoV-2 viral copies this assay can detect is 138 copies/mL. A negative result does not preclude SARS-Cov-2 infection and should not be used as the sole basis for treatment or other patient management decisions. A negative result may occur with  improper specimen collection/handling, submission of specimen other than nasopharyngeal swab, presence of viral mutation(s) within the areas targeted by this assay, and inadequate number of viral copies(<138 copies/mL). A negative result must be combined with clinical observations, patient history, and epidemiological information. The expected result is Negative.  Fact Sheet for Patients:  EntrepreneurPulse.com.au  Fact Sheet for Healthcare Providers:  IncredibleEmployment.be  This test is no t yet approved or cleared by the [place] FDA and  has been authorized for detection and/or diagnosis of SARS-CoV-2 by FDA under an Emergency Use Authorization (EUA). This EUA will remain  in effect (meaning this test can be used) for the duration of the COVID-19 declaration under Section 564(b)(1) of the Act, 21 U.S.C.section 360bbb-3(b)(1), unless the authorization is terminated  or revoked sooner.       Influenza A by PCR NEGATIVE NEGATIVE Final   Influenza B by PCR NEGATIVE NEGATIVE Final     Comment: (NOTE) The Xpert Xpress SARS-CoV-2/FLU/RSV plus assay is intended as an aid in the diagnosis of influenza from Nasopharyngeal swab specimens and should not be used as a sole basis for treatment. Nasal washings and aspirates are unacceptable for Xpert Xpress SARS-CoV-2/FLU/RSV testing.  Fact Sheet for Patients: EntrepreneurPulse.com.au  Fact Sheet for Healthcare Providers: IncredibleEmployment.be  This test is not yet approved or cleared by the [place] FDA and has been authorized for detection and/or diagnosis of SARS-CoV-2 by FDA under an Emergency Use Authorization (EUA). This EUA will remain in effect (meaning this test can be used) for the duration of the COVID-19 declaration under Section 564(b)(1) of the Act, 21 U.S.C. section 360bbb-3(b)(1), unless the authorization is terminated or revoked.  Performed at [facility], [address], [place], [place] [address]     Labs: CBC: Recent Labs  Lab 02/06/22 1151 02/07/22 0429  WBC 11.5* 7.7  HGB 11.8* 11.8*  HCT 35.6* 34.9*  MCV 93.0 95.1  PLT 268 761   Basic Metabolic Panel: Recent Labs  Lab 02/06/22 1011 02/06/22 1151 02/07/22 0429  NA 140 142 137  K 4.3 3.6 3.6  CL 102 105 105  CO2 _0 GLUCOSE 114* 95 125*  BUN 33* 35* 27*  CREATININE 1.36* 1.37* 1.29*  CALCIUM 9.6 9.4 8.6*   Liver Function Tests: Recent Labs  Lab 02/06/22 1151 02/07/22 0429  AST 67* 143*  ALT 34 103*  ALKPHOS 224* 289*  BILITOT 0.8 0.6  PROT 6.8 5.9*  ALBUMIN 3.3* 2.8*   CBG: Recent Labs  Lab 02/06/22 2305 02/07/22 0738 02/07/22 1159 02/07/22 1610  GLUCAP 200* 81 115* 212*    Discharge time spent: less than 30 minutes.  Signed: Marylu Lund, MD [facility] 02/07/2022

## 2022-02-07 NOTE — Progress Notes (Signed)
D/C instructions reviewed w/ pt. Pt verbalizes understanding and all questions answered. Pt d/c in w/c to wife's car in stable condition. Pt in possession of d/c packet and all personal belongings.

## 2022-02-10 ENCOUNTER — Telehealth: Payer: Self-pay | Admitting: Family

## 2022-02-10 ENCOUNTER — Telehealth: Payer: Self-pay | Admitting: Cardiology

## 2022-02-10 MED ORDER — AMLODIPINE BESYLATE 5 MG PO TABS: 5.0000 mg | ORAL_TABLET | Freq: Every day | ORAL | 6 refills | Status: DC

## 2022-02-10 MED ORDER — AMLODIPINE BESYLATE 5 MG PO TABS: 5.0000 mg | ORAL_TABLET | Freq: Every day | ORAL | 3 refills | Status: DC

## 2022-02-10 NOTE — Telephone Encounter (Signed)
Appreciate him making [place] aware. Please ensure he has follow up with primary care which was recommended at hospital discharge. Via separate phone note he requested refill of Amlodipine, please send Rx. BP in hospital was labile which is expected given pain from pancreatitis. Recommend he check BP once per day two hours after medication and call us Friday with report of BP. We will decide at that time whether any medication changes are made. He did have some good BP readings while in the hospital!  [facility] [place].  Loel Dubonnet, NP

## 2022-02-10 NOTE — Telephone Encounter (Signed)
RN called patient with updated and left on VM, refill sent in to pharmacy on file! RN to follow up on Friday for blood pressure log!     "Appreciate him making [place] aware. Please ensure he has follow up with primary care which was recommended at hospital discharge. Via separate phone note he requested refill of Amlodipine, please send Rx. BP in hospital was labile which is expected given pain from pancreatitis. Recommend he check BP once per day two hours after medication and call us Friday with report of BP. We will decide at that time whether any medication changes are made. He did have some good BP readings while in the hospital!   [facility] as [place].   Loel Dubonnet, NP"

## 2022-02-10 NOTE — Telephone Encounter (Signed)
Please advise if any changes to be made. He last saw Sharyn Lull in the office, but looks like he is following up with you next. This is the little gentleman that brings his blood pressures by each week!

## 2022-02-10 NOTE — Telephone Encounter (Signed)
Pts wife is wanting to advise that pt had a pancreatic attack over the weekend and wanted to make [place] aware... please advise

## 2022-02-10 NOTE — Telephone Encounter (Signed)
°*  STAT* If patient is at the pharmacy, call can be transferred to refill team.   1. Which medications need to be refilled? (please list name of each medication and dose if known) amLODipine (NORVASC) 10 MG tablet  2. Which pharmacy/location (including street and city if local pharmacy) is medication to be sent to?[id number] [address], [place] [address] AT [address] [place]  Phone:  [phone] Fax:  [phone]  3. Do they need a 30 day or 90 day supply? 30 ds

## 2022-02-10 NOTE — Telephone Encounter (Signed)
*  STAT* If patient is at the pharmacy, call can be transferred to refill team.     1. Which medications need to be refilled? (please list name of each medication and dose if known) amLODipine (NORVASC) 10 MG tablet   2. Which pharmacy/location (including street and city if local pharmacy) is medication to be sent to? [facility] [place], [address] [place]            ZOXWR:[phone] BJY:[phone]   3. Do they need a 30 day or 90 day supply? 30 ds    Pt is out of medication

## 2022-02-14 ENCOUNTER — Encounter (HOSPITAL_BASED_OUTPATIENT_CLINIC_OR_DEPARTMENT_OTHER): Payer: Self-pay

## 2022-02-14 NOTE — Telephone Encounter (Signed)
BP well controlled at clinic visit 01/29/22. He was started on Lasix 20mg  QD due to LE edema. BP cuff previously found to be running about 14 pounds higher than panual reading. Sees hematology upcoming 02/18/22 and PCP 02/21/22. ? ?Recommend continue current medications. Check BP 2-3 times per week after sitting and resting for 5-10 minutes. We will reassess for any medication changes based on BP at upcoming appointments. We will reach out to him with an update 02/24/22.  ? ?Bill Dubonnet, NP  ?

## 2022-02-14 NOTE — Telephone Encounter (Signed)
BP log:

## 2022-02-18 ENCOUNTER — Other Ambulatory Visit: Payer: Self-pay

## 2022-02-18 ENCOUNTER — Inpatient Hospital Stay: Payer: Medicare Other | Attending: Nurse Practitioner | Admitting: Nurse Practitioner

## 2022-02-18 ENCOUNTER — Inpatient Hospital Stay: Payer: Medicare Other

## 2022-02-18 ENCOUNTER — Encounter: Payer: Self-pay | Admitting: Nurse Practitioner

## 2022-02-18 VITALS — BP 156/75 | HR 76 | Temp 98.3°F | Resp 18 | Ht 69.0 in | Wt 237.4 lb

## 2022-02-18 DIAGNOSIS — E119 Type 2 diabetes mellitus without complications: Secondary | ICD-10-CM | POA: Diagnosis not present

## 2022-02-18 DIAGNOSIS — I10 Essential (primary) hypertension: Secondary | ICD-10-CM | POA: Insufficient documentation

## 2022-02-18 DIAGNOSIS — C251 Malignant neoplasm of body of pancreas: Secondary | ICD-10-CM | POA: Insufficient documentation

## 2022-02-18 DIAGNOSIS — K2960 Other gastritis without bleeding: Secondary | ICD-10-CM

## 2022-02-18 DIAGNOSIS — Z95828 Presence of other vascular implants and grafts: Secondary | ICD-10-CM

## 2022-02-18 MED ORDER — SODIUM CHLORIDE 0.9% FLUSH: 10.0000 mL | Freq: Once | INTRAVENOUS | Status: DC

## 2022-02-18 MED ORDER — HEPARIN SOD (PORK) LOCK FLUSH 100 UNIT/ML IV SOLN: 500.0000 [IU] | Freq: Once | INTRAVENOUS | Status: DC

## 2022-02-18 NOTE — Progress Notes (Signed)
?Bill ?OFFICE PROGRESS NOTE ? ? ?Diagnosis:  Pancreas cancer ? ?INTERVAL HISTORY:  ? ?[name] returns as scheduled.  He was hospitalized with acute pancreatitis 02/06/2022 through 02/07/2022.  He reports developing abdominal pain the day of admission after eating a large breakfast. ? ?He feels he is recovering from the recent hospitalization.  No nausea or vomiting.  No diarrhea.  No abdominal pain.  Appetite is good.  He thinks there may be some improvement in bilateral foot numbness.  He walks with a cane or walker.  Stable numbness in the hands. ? ?Objective: ? ?Vital signs in last 24 hours: ? ?Blood pressure (!) 156/75, pulse 76, temperature 98.3 ?F (36.8 ?C), temperature source Oral, resp. rate 18, height '5\' 9"'$  (1.753 m), weight 237 lb 6.4 oz (107.7 kg), SpO2 99 %. ?  ? ?HEENT: No thrush or ulcers. ?Lymphatics: No palpable cervical, supraclavicular, axillary or inguinal lymph nodes. ?Resp: Lungs clear bilaterally. ?Cardio: Regular rate and rhythm. ?GI: No hepatosplenomegaly. ?Vascular: Trace lower leg edema bilaterally. ? ?Port-A-Cath without erythema. ? ?Lab Results: ? ?Lab Results  ?Component Value Date  ? WBC 7.7 02/07/2022  ? HGB 11.8 (L) 02/07/2022  ? HCT 34.9 (L) 02/07/2022  ? MCV 95.1 02/07/2022  ? PLT 238 02/07/2022  ? NEUTROABS 4.9 01/07/2022  ? ? ?Imaging: ? ?No results found. ? ?Medications: I have reviewed the patient's current medications. ? 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 ?EUS 01/15/2021-18 x 23 mm mass in the genu of the pancreas, T2N0, abutment of the splenoportal confluence, changes of chronic pancreatitis, cystic lesion in the pancreas body consistent with a  branch intraductal papillary mucinous neoplasm ?Normal CA 19-9 01/24/2021 ?CTs 01/29/2021-no pancreatic mass.  No pancreatic ductal dilatation identified.  No definite signs of metastatic disease in the chest, abdomen or pelvis. ?Cycle 1 gemcitabine/Abraxane 03/01/2021 ?Cycle 2 gemcitabine/Abraxane 03/14/2021 ?Cycle 3 gemcitabine/Abraxane 03/28/2021 ?Cycle 4 gemcitabine/Abraxane 04/17/2021 ?Cycle 5 gemcitabine/Abraxane 05/01/2021, Zofran/Decadron added ?Cycle 6 gemcitabine/Abraxane 05/15/2021 ?CT pancreas protocol 05/27/2021-unchanged 8 mm exophytic low-attenuation lesion at the posterior body of the pancreas ?07/01/2021-distal pancreatectomy/splenectomy, 0.8 cm poorly differentiated adenocarcinoma, treatment response score-2, largest single foci of remaining tumor 0.2 cm, negative resection margins, 0/6 lymph nodes,ypT1bypNo, PanIN-1b ?Cycle 7 gemcitabine/Abraxane 08/14/2021 ?Cycle 8 gemcitabine/Abraxane 09/04/2021 ?Cycle 9 gemcitabine/Abraxane 09/18/2021 ?Cycle 10 gemcitabine/Abraxane 10/01/2021 ?Cycle 11 gemcitabine/Abraxane 10/16/2021 ?Cycle 12 gemcitabine/Abraxane 10/29/2021, Abraxane held secondary to neuropathy ?CT abdomen/pelvis 02/06/2022-acute interstitial edematous pancreatitis with small 1.6 cm acute peripancreatic fluid collection within the superior aspect of the pancreatic head; interval postoperative changes of distal pancreatectomy and splenectomy; stable benign left adrenal adenoma ?Diabetes ?Coronary artery disease ?Prostate cancer 2013-[facility] with [facility] at [facility] ?Hypertension ?Sleep apnea ?7.  Coronary artery bypass surgery 2004 ?8.  Hospital admission 03/04/2021-syncope, A. Fib-started on Eliquis ?9.  Mild thrombocytopenia following cycle 1 gemcitabine/Abraxane-resolved ?10.  Anemia ?11.  Admission 09/22/2021 with a right metatarsal ulcer and right leg cellulitis ?12.  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  ? ?Disposition: [name] appears stable.  He continues to recover from the  recent hospitalization with acute pancreatitis.  He remains in clinical remission from pancreas cancer.  We will obtain a CA 19-9 when he returns for follow-up in 6 weeks. ? ? ? ?Ned Card ANP/GNP-BC  ? ?02/18/2022  ?10:34  AM ? ? ? ? ? ? ? ?

## 2022-02-18 NOTE — Progress Notes (Signed)
Patient stated he had port flushed on 02/14/22 while in the hospital.  No need to flush port.  ?

## 2022-02-18 NOTE — Patient Instructions (Signed)

## 2022-02-19 NOTE — Progress Notes (Signed)
HPI: [name] is a 76 y.o. male, who is here today with his wife to follow on recent hospital visit.  Hospitalized on 02/06/2022 and discharged home on 02/07/2022.  He presented to the ED complaining of severe epigastric abdominal pain after eating breakfast (bacon and eggs) , he was transferred to the ER and admitted with Dx of acute pancreatitis. BP elevated at 170/90. He had leukocytosis of 11.5.  Cr 1.37.  ARB was held during hospitalization, already resumed. Lipase of greater than 3000.   Elevated AST of 67, normal ALT, elevated alkaline phosphatase. CT of the abdomen showing acute interstitial edematous pancreatitis with peripancreatic fluid collection.  HLD on Atorvastatin 80 mg daily. Aortic atherosclerosis seen on recent abdominal CT.  Lab Results  Component Value Date   CHOL 113 10/10/2020   HDL 34 (L) 10/10/2020   LDLCALC 55 10/10/2020   TRIG 53 02/07/2022   CHOLHDL 3.3 10/10/2020   Lab Results  Component Value Date   LIPASE 170 (H) 02/07/2022   Denies abdominal pain, nausea, vomiting, or changes in bowel habits.  HTN and CAD: He is monitoring BP at home and sending readings to his cardiologist. Currently he is on Amlodipine 5 mg daily,Hydralazine 25 mg tid (recently added),and valsartan 320 mg daily. BP readings have improved. Negative for CP,SOB,diaphoresis,palpitations,orthopnea,PND,or worsening edema. Atrial fib on Eliquis 5 mg bid.  CKD III: He has not noted foam in urine,gross hematuria,or decreased urine output.  Lab Results  Component Value Date   CREATININE 1.29 (H) 02/07/2022   BUN 27 (H) 02/07/2022   NA 137 02/07/2022   K 3.6 02/07/2022   CL 105 02/07/2022   CO2 28 02/07/2022   Anemia: He is on Fe Sulfate 325 mg daily. He has not noted blood in stool or melena. Following with hematologist.  Lab Results  Component Value Date   WBC 7.7 02/07/2022   HGB 11.8 (L) 02/07/2022   HCT 34.9 (L) 02/07/2022   MCV 95.1 02/07/2022   PLT  238 02/07/2022   DM II: Having some BS's 70-80's, max 130's, and a few 60's. He is on Lantus 50 U daily. Negative for polydipsia,polyuria, or polyphagia. Lab Results  Component Value Date   HGBA1C 6.9 01/06/2022   Depression on Prozac 20 mg 2 caps daily and Trazodone 100 mg daily. Follows with psychiatrist.   Review of Systems  Constitutional:  Negative for appetite change, chills and fever.  HENT:  Negative for sore throat and trouble swallowing.   Eyes:  Negative for redness and visual disturbance.  Respiratory:  Negative for cough and wheezing.   Endocrine: Negative for polydipsia, polyphagia and polyuria.  Musculoskeletal:  Positive for gait problem.  Skin:  Negative for rash.  Neurological:  Negative for syncope and headaches.  Rest see pertinent positives and negatives per HPI.  Current Outpatient Medications on File Prior to Visit  Medication Sig Dispense Refill   acetaminophen (TYLENOL) 325 MG tablet Take 2 tablets (650 mg total) by mouth every 6 (six) hours as needed for mild pain. (Patient taking differently: Take 650 mg by mouth every 6 (six) hours as needed for mild pain or fever.)     amLODipine (NORVASC) 5 MG tablet Take 1 tablet (5 mg total) by mouth daily. 90 tablet 3   apixaban (ELIQUIS) 5 MG TABS tablet Take 5 mg by mouth 2 (two) times daily.     atorvastatin (LIPITOR) 80 MG tablet Take 40 mg by mouth at bedtime.     calcium-vitamin D (OSCAL  WITH D) 500-200 MG-UNIT tablet Take 1 tablet by mouth 2 (two) times daily.     clotrimazole-betamethasone (LOTRISONE) cream Apply 1 application topically daily as needed. 45 g 2   cycloSPORINE (RESTASIS) 0.05 % ophthalmic emulsion Place 1 drop into both eyes 2 (two) times daily as needed (dry eyes).     ferrous sulfate 325 (65 FE) MG tablet Take 1 tablet (325 mg total) by mouth daily with breakfast. 90 tablet 2   FLUoxetine (PROZAC) 20 MG capsule Take 40 mg by mouth daily.     fluticasone (FLONASE) 50 MCG/ACT nasal spray Place  1 spray into both nostrils daily.     furosemide (LASIX) 20 MG tablet Take 1 tablet (20 mg total) by mouth daily. 90 tablet 3   glucose blood (ONETOUCH VERIO) test strip Use to test blood sugars 1-2 times daily. 200 each 12   insulin glargine (LANTUS) 100 UNIT/ML injection Inject 0.3 mLs (30 Units total) into the skin daily. (Patient taking differently: Inject 50 Units into the skin every morning.) 10 mL 3   lidocaine-prilocaine (EMLA) cream Apply 1 application. topically as needed.     Multiple Vitamin (MULTI-VITAMINS) TABS Take 1 tablet by mouth daily.     nitroGLYCERIN (NITROSTAT) 0.3 MG SL tablet Place 0.3 mg under the tongue every 5 (five) minutes as needed for chest pain.     nystatin powder Apply 1 application topically 3 (three) times daily. 15 g 2   pantoprazole (PROTONIX) 40 MG tablet Take 1 tablet (40 mg total) by mouth daily. 90 tablet 3   potassium chloride SA (KLOR-CON M) 20 MEQ tablet TAKE 1 TABLET BY MOUTH EVERY DAY 30 tablet 1   tamsulosin (FLOMAX) 0.4 MG CAPS capsule Take 0.8 mg by mouth at bedtime.     traZODone (DESYREL) 100 MG tablet Take 100 mg by mouth at bedtime.     valsartan (DIOVAN) 320 MG tablet Take 1 tablet (320 mg total) by mouth daily. 90 tablet 3   vitamin B-12 (CYANOCOBALAMIN) 1000 MCG tablet Take 1,000 mcg by mouth daily.     Wheat Dextrin (BENEFIBER DRINK MIX PO) Take by mouth.     No current facility-administered medications on file prior to visit.    Past Medical History:  Diagnosis Date   A-fib ([place]) 03/04/2021   Anemia    Arthritis    Cancer (HCC)    prostate   Chronic kidney disease    blood in urine    Constipation    Coronary artery disease    Depression    Diabetes mellitus without complication ([place])    Difficult intubation    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 it was hard to get the tube down his throat   Dyspnea    Family history of breast cancer    Fatty liver    Fatty liver    Frequent headaches    GERD (gastroesophageal reflux disease)     History of chicken pox    History of fainting spells of unknown cause    History of prostate cancer    Hyperlipidemia    Hypertension    Myocardial infarction ([place])    Peripheral neuropathy    Pneumonia    Prostate cancer (HCC)    PTSD (post-traumatic stress disorder)    Sleep apnea    uses Cpap   Allergies  Allergen Reactions   Keflex [Cephalexin] Other (See Comments)    Hallucinations?   Lisinopril Other (See Comments)    Unknown   Terazosin Other (See  Comments)    Unknown    Social History   Socioeconomic History   Marital status: Married    Spouse name: Not on file   Number of children: 2   Years of education: Not on file   Highest education level: Not on file  Occupational History   Occupation: retired  Tobacco Use   Smoking status: Former    Types: Cigarettes    Quit date: 12/14/1975    Years since quitting: 46.2   Smokeless tobacco: Never  Vaping Use   Vaping Use: Not on file  Substance and Sexual Activity   Alcohol use: Not Currently   Drug use: Never   Sexual activity: Not Currently  Other Topics Concern   Not on file  Social History Narrative   ** Merged History Encounter **       Social Determinants of Health   Financial Resource Strain: Low Risk    Difficulty of Paying Living Expenses: Not hard at all  Food Insecurity: No Food Insecurity   Worried About Charity fundraiser in the Last Year: Never true   [place] in the Last Year: Never true  Transportation Needs: No Transportation Needs   Lack of Transportation (Medical): No   Lack of Transportation (Non-Medical): No  Physical Activity: Insufficiently Active   Days of Exercise per Week: 2 days   Minutes of Exercise per Session: 60 min  Stress: No Stress Concern Present   Feeling of Stress : Not at all  Social Connections: Moderately Isolated   Frequency of Communication with Friends and Family: More than three times a week   Frequency of Social Gatherings with Friends and Family:  More than three times a week   Attends Religious Services: Never   Marine scientist or Organizations: No   Attends Archivist Meetings: Never   Marital Status: Married   Vitals:   02/21/22 1118  BP: 138/70  Pulse: 92  Resp: 16  SpO2: 99%   Body mass index is 35 kg/m.  Physical Exam Vitals and nursing note reviewed.  Constitutional:      General: He is not in acute distress.    Appearance: He is well-developed.  HENT:     Head: Normocephalic and atraumatic.  Eyes:     Conjunctiva/sclera: Conjunctivae normal.  Cardiovascular:     Rate and Rhythm: Normal rate. Rhythm irregular. Extrasystoles are present.    Pulses:          Dorsalis pedis pulses are 2+ on the right side and 2+ on the left side.     Heart sounds: No murmur heard. Pulmonary:     Effort: Pulmonary effort is normal. No respiratory distress.     Breath sounds: Normal breath sounds.  Abdominal:     Palpations: Abdomen is soft. There is no hepatomegaly or mass.     Tenderness: There is no abdominal tenderness.  Musculoskeletal:     Right lower leg: 2+ Pitting Edema present.     Left lower leg: 2+ Pitting Edema present.  Lymphadenopathy:     Cervical: No cervical adenopathy.  Skin:    General: Skin is warm.     Findings: No erythema or rash.  Neurological:     Mental Status: He is alert and oriented to person, place, and time.     Cranial Nerves: No cranial nerve deficit.     Comments: Unstable gait assisted with a walker.  Psychiatric:     Comments: Well groomed, good  eye contact.   ASSESSMENT AND PLAN:  [name] was seen today for hospitalization follow-up.  Diagnoses and all orders for this visit: Orders Placed This Encounter  Procedures   Comprehensive metabolic panel   Lipase   Lipid panel   Lab Results  Component Value Date   LIPASE 31.0 02/21/2022   Lab Results  Component Value Date   CREATININE 1.56 (H) 02/21/2022   BUN 43 (H) 02/21/2022   NA 134 (L) 02/21/2022   K  4.5 02/21/2022   CL 100 02/21/2022   CO2 28 02/21/2022   Lab Results  Component Value Date   ALT 17 02/21/2022   AST 24 02/21/2022   ALKPHOS 173 (H) 02/21/2022   BILITOT 0.4 02/21/2022   Lab Results  Component Value Date   CHOL 141 02/21/2022   HDL 46.80 02/21/2022   LDLCALC 66 02/21/2022   TRIG 140.0 02/21/2022   CHOLHDL 3 02/21/2022   Hypertension with heart disease BP adequately controled today. Continue monitoring BP at home. No changes in Amlodipine,hydralazine or Valsartan dose. Continue low salt diet.  Atherosclerosis of aorta ([place]) We discussed vascular findings on recent abdominal CT. Continue Atorvastatin 40 mg daily.   Dyslipidemia, goal LDL below 70 Continue Atorvastatin 80 mg 1/2 tab daily and low fat diet. Further recommendations according to lipid panel result.  DM (diabetes mellitus), type 2 with renal complications ([place]) WNU2V at goal in 12/2021. For now continue Lantus 50 U daily. Instructed to decrease lantus dose by 5 U every 1-2 weeks if frequent BS's 70-80's, before if hypoglycemic events. Continue appropriate foot care and annual eye exam.  Stage 3a chronic kidney disease ([place]) AKI with Cr 1.36 (1.15), improved at the time of hospital discharge, 1.29. Continue Adequate hydration and low salt diet. Adequate BP and glucose controlled. No changes in Valsartan dose.  Anemia, unspecified type Normocytic,normochromic. Iron def and chronic disease. Improved after completing pancreatic cancer chemotherapy. Continue Fe sulfate 325 mg daily. Following with hematologist.  Major depressive disorder in partial remission, unspecified whether recurrent ([place]) Follows with psychiatrist.  Coronary artery disease of bypass graft of native heart with stable angina pectoris ([place]) He is on Atorvastatin 80 mg 1/2 tab daily and Amlodipine. Following with cardiologist.  I spent a total of 42 minutes in both face to face and non face to face activities for this  visit on the date of this encounter. During this time history was obtained and documented, examination was performed, prior labs reviewed, and assessment/plan discussed.  Return if symptoms worsen or fail to improve, for Keep next appt..  Minna Dumire G. [name], MD  [facility]. [place] office.

## 2022-02-21 ENCOUNTER — Encounter: Payer: Self-pay | Admitting: Family Medicine

## 2022-02-21 ENCOUNTER — Other Ambulatory Visit: Payer: Self-pay

## 2022-02-21 ENCOUNTER — Ambulatory Visit (INDEPENDENT_AMBULATORY_CARE_PROVIDER_SITE_OTHER): Payer: Medicare Other | Admitting: Family Medicine

## 2022-02-21 VITALS — BP 138/70 | HR 92 | Resp 16 | Ht 69.0 in | Wt 237.0 lb

## 2022-02-21 DIAGNOSIS — N1831 Chronic kidney disease, stage 3a: Secondary | ICD-10-CM

## 2022-02-21 DIAGNOSIS — E785 Hyperlipidemia, unspecified: Secondary | ICD-10-CM

## 2022-02-21 DIAGNOSIS — E1122 Type 2 diabetes mellitus with diabetic chronic kidney disease: Secondary | ICD-10-CM

## 2022-02-21 DIAGNOSIS — D649 Anemia, unspecified: Secondary | ICD-10-CM

## 2022-02-21 DIAGNOSIS — K859 Acute pancreatitis without necrosis or infection, unspecified: Secondary | ICD-10-CM | POA: Diagnosis not present

## 2022-02-21 DIAGNOSIS — I119 Hypertensive heart disease without heart failure: Secondary | ICD-10-CM | POA: Diagnosis not present

## 2022-02-21 DIAGNOSIS — I25708 Atherosclerosis of coronary artery bypass graft(s), unspecified, with other forms of angina pectoris: Secondary | ICD-10-CM

## 2022-02-21 DIAGNOSIS — N183 Chronic kidney disease, stage 3 unspecified: Secondary | ICD-10-CM

## 2022-02-21 DIAGNOSIS — I2510 Atherosclerotic heart disease of native coronary artery without angina pectoris: Secondary | ICD-10-CM

## 2022-02-21 DIAGNOSIS — Z794 Long term (current) use of insulin: Secondary | ICD-10-CM

## 2022-02-21 DIAGNOSIS — F324 Major depressive disorder, single episode, in partial remission: Secondary | ICD-10-CM | POA: Diagnosis not present

## 2022-02-21 DIAGNOSIS — I700 Atherosclerosis of aorta: Secondary | ICD-10-CM | POA: Diagnosis not present

## 2022-02-21 LAB — COMPREHENSIVE METABOLIC PANEL
ALT: 17 U/L (ref 0–53)
AST: 24 U/L (ref 0–37)
Albumin: 3.4 g/dL — ABNORMAL LOW (ref 3.5–5.2)
Alkaline Phosphatase: 173 U/L — ABNORMAL HIGH (ref 39–117)
BUN: 43 mg/dL — ABNORMAL HIGH (ref 6–23)
CO2: 28 mEq/L (ref 19–32)
Calcium: 9.7 mg/dL (ref 8.4–10.5)
Chloride: 100 mEq/L (ref 96–112)
Creatinine, Ser: 1.56 mg/dL — ABNORMAL HIGH (ref 0.40–1.50)
GFR: 42.63 mL/min — ABNORMAL LOW (ref >60.00)
Glucose, Bld: 381 mg/dL — ABNORMAL HIGH (ref 70–99)
Potassium: 4.5 mEq/L (ref 3.5–5.1)
Sodium: 134 mEq/L — ABNORMAL LOW (ref 135–145)
Total Bilirubin: 0.4 mg/dL (ref 0.2–1.2)
Total Protein: 6.8 g/dL (ref 6.0–8.3)

## 2022-02-21 LAB — LIPASE: Lipase: 31 U/L (ref 11.0–59.0)

## 2022-02-21 LAB — LIPID PANEL
Cholesterol: 141 mg/dL (ref 0–200)
HDL: 46.8 mg/dL (ref >39.00)
LDL Cholesterol: 66 mg/dL (ref 0–99)
NonHDL: 94.31
Total CHOL/HDL Ratio: 3
Triglycerides: 140 mg/dL (ref 0.0–149.0)
VLDL: 28 mg/dL (ref 0.0–40.0)

## 2022-02-21 MED ORDER — HYDRALAZINE HCL 25 MG PO TABS: 25.0000 mg | ORAL_TABLET | Freq: Three times a day (TID) | ORAL | 3 refills | Status: DC

## 2022-02-21 NOTE — Assessment & Plan Note (Signed)
We discussed vascular findings on recent abdominal CT. ?Continue Atorvastatin 40 mg daily.  ?

## 2022-02-21 NOTE — Assessment & Plan Note (Addendum)
BP adequately controled today. ?Continue monitoring BP at home. ?No changes in Amlodipine,hydralazine or Valsartan dose. ?Continue low salt diet. ?

## 2022-02-21 NOTE — Telephone Encounter (Signed)
He is maxed out on Valsartan and I wouldn't increase amlodipine since he has LE edema. I would add hydralazine 25 mg tid for BP ? ?Elim Economou [name] MD, [facility] ? ?

## 2022-02-21 NOTE — Assessment & Plan Note (Addendum)
HgA1C at goal in 12/2021. ?For now continue Lantus 50 U daily. ?Instructed to decrease lantus dose by 5 U every 1-2 weeks if frequent BS's 70-80's, before if hypoglycemic events. ?Continue appropriate foot care and annual eye exam. ? ?

## 2022-02-21 NOTE — Patient Instructions (Addendum)
A few things to remember from today's visit: ? ?Hypertension with heart disease ? ?Acute pancreatitis without infection or necrosis, unspecified pancreatitis type - Plan: Comprehensive metabolic panel, Lipase ? ?Atherosclerosis of aorta ([place]) ? ?Type 2 diabetes mellitus with hyperglycemia, unspecified whether long term insulin use ([place]) ? ?If you need refills please call your pharmacy. ?Do not use My Chart to request refills or for acute issues that need immediate attention. ?  ?You can decrease Lantus dose by 5 U every 10=-2 weeks if you have several blood sugars 70-80's, sooner if less than 70. ?No changes in rest. ? ?Please be sure medication list is accurate. ?If a new problem present, please set up appointment sooner than planned today. ? ? ? ? ? ? ? ?

## 2022-02-21 NOTE — Assessment & Plan Note (Signed)
Continue Atorvastatin 80 mg 1/2 tab daily and low fat diet. ?Further recommendations according to lipid panel result. ?

## 2022-02-24 ENCOUNTER — Telehealth: Payer: Self-pay

## 2022-02-24 NOTE — Telephone Encounter (Signed)
Spoke to patient about email he sent on Fri 3/10.I was calling to make sure you understood to continue all medications.Dr.Jordan advised to start Hydralazine 25 mg three times a day.Continue to monitor B/P and call back if B/P remains elevated. ? ?Patient stated he wants to change providers.Stated he wants to change to Dr.Christopher at [facility].Stated [facility] is closer to his home.Advised I will send message to [place] and Dr.Christopher. ? ?

## 2022-02-26 ENCOUNTER — Telehealth: Payer: Self-pay | Admitting: *Deleted

## 2022-02-26 NOTE — Chronic Care Management (AMB) (Signed)
?  Chronic Care Management  ? ?Note ? ?02/26/2022 ?Name: [name] MRN: [id number] DOB: 1946/04/16 ? ?[name] is a 76 y.o. year old male who is a primary care patient of [name], Malka So, MD. I reached out to [name] by phone today in response to a referral sent by [name]'s PCP. ? ?[name] was given information about Chronic Care Management services today including:  ?CCM service includes personalized support from designated clinical staff supervised by his physician, including individualized plan of care and coordination with other care providers ?24/7 contact phone numbers for assistance for urgent and routine care needs. ?Service will only be billed when office clinical staff spend 20 minutes or more in a month to coordinate care. ?Only one practitioner may furnish and bill the service in a calendar month. ?The patient may stop CCM services at any time (effective at the end of the month) by phone call to the office staff. ?The patient is responsible for co-pay (up to 20% after annual deductible is met) if co-pay is required by the individual health plan.  ? ?Patient did not agree to enrollment in care management services and does not wish to consider at this time. ? ?Follow up plan: ? The care management team is available to follow up with the patient after provider conversation with the patient regarding recommendation for care management engagement and subsequent re-referral to the care management team.  ? ?Laverda Sorenson  ?Care Guide, Embedded Care Coordination ?  Care Management  ?Direct Dial: [phone] ? ?

## 2022-03-03 NOTE — Telephone Encounter (Signed)
Ok by me

## 2022-03-04 DIAGNOSIS — R3121 Asymptomatic microscopic hematuria: Secondary | ICD-10-CM | POA: Diagnosis not present

## 2022-03-04 DIAGNOSIS — Z8546 Personal history of malignant neoplasm of prostate: Secondary | ICD-10-CM | POA: Diagnosis not present

## 2022-03-04 DIAGNOSIS — D3500 Benign neoplasm of unspecified adrenal gland: Secondary | ICD-10-CM | POA: Diagnosis not present

## 2022-03-04 DIAGNOSIS — R3915 Urgency of urination: Secondary | ICD-10-CM | POA: Diagnosis not present

## 2022-03-19 ENCOUNTER — Encounter (HOSPITAL_BASED_OUTPATIENT_CLINIC_OR_DEPARTMENT_OTHER): Payer: Self-pay

## 2022-03-19 MED ORDER — HYDRALAZINE HCL 25 MG PO TABS: 25.0000 mg | ORAL_TABLET | Freq: Three times a day (TID) | ORAL | 1 refills | Status: DC

## 2022-03-19 NOTE — Telephone Encounter (Signed)
BP logs as requested ?

## 2022-03-20 NOTE — Telephone Encounter (Signed)
BP readings are good ? ?Bill Gwaltney [name] MD, [facility] ? ?

## 2022-03-20 NOTE — Telephone Encounter (Signed)
Spoke to patient Dr.Jordan advised blood pressure readings look good.Advised to continue same medications. ?

## 2022-03-28 ENCOUNTER — Other Ambulatory Visit: Payer: Self-pay | Admitting: Oncology

## 2022-03-28 DIAGNOSIS — C250 Malignant neoplasm of head of pancreas: Secondary | ICD-10-CM

## 2022-04-01 ENCOUNTER — Inpatient Hospital Stay: Payer: Medicare Other | Attending: Nurse Practitioner

## 2022-04-01 ENCOUNTER — Telehealth: Payer: Self-pay

## 2022-04-01 ENCOUNTER — Inpatient Hospital Stay: Payer: Medicare Other

## 2022-04-01 ENCOUNTER — Inpatient Hospital Stay (HOSPITAL_BASED_OUTPATIENT_CLINIC_OR_DEPARTMENT_OTHER): Payer: Medicare Other | Admitting: Oncology

## 2022-04-01 VITALS — BP 157/74 | HR 81 | Temp 97.8°F | Resp 20 | Ht 69.0 in | Wt 256.0 lb

## 2022-04-01 DIAGNOSIS — D649 Anemia, unspecified: Secondary | ICD-10-CM | POA: Insufficient documentation

## 2022-04-01 DIAGNOSIS — E1140 Type 2 diabetes mellitus with diabetic neuropathy, unspecified: Secondary | ICD-10-CM | POA: Insufficient documentation

## 2022-04-01 DIAGNOSIS — C251 Malignant neoplasm of body of pancreas: Secondary | ICD-10-CM | POA: Insufficient documentation

## 2022-04-01 DIAGNOSIS — I10 Essential (primary) hypertension: Secondary | ICD-10-CM | POA: Insufficient documentation

## 2022-04-01 DIAGNOSIS — I4891 Unspecified atrial fibrillation: Secondary | ICD-10-CM | POA: Insufficient documentation

## 2022-04-01 DIAGNOSIS — G4730 Sleep apnea, unspecified: Secondary | ICD-10-CM | POA: Diagnosis not present

## 2022-04-01 DIAGNOSIS — R944 Abnormal results of kidney function studies: Secondary | ICD-10-CM | POA: Diagnosis not present

## 2022-04-01 DIAGNOSIS — K2960 Other gastritis without bleeding: Secondary | ICD-10-CM

## 2022-04-01 DIAGNOSIS — Z7901 Long term (current) use of anticoagulants: Secondary | ICD-10-CM | POA: Insufficient documentation

## 2022-04-01 DIAGNOSIS — Z95828 Presence of other vascular implants and grafts: Secondary | ICD-10-CM

## 2022-04-01 LAB — CBC WITH DIFFERENTIAL (CANCER CENTER ONLY)
Abs Immature Granulocytes: 0.02 10*3/uL (ref 0.00–0.07)
Basophils Absolute: 0 10*3/uL (ref 0.0–0.1)
Basophils Relative: 0 %
Eosinophils Absolute: 0.2 10*3/uL (ref 0.0–0.5)
Eosinophils Relative: 2 %
HCT: 35.3 % — ABNORMAL LOW (ref 39.0–52.0)
Hemoglobin: 11.5 g/dL — ABNORMAL LOW (ref 13.0–17.0)
Immature Granulocytes: 0 %
Lymphocytes Relative: 34 %
Lymphs Abs: 3.1 10*3/uL (ref 0.7–4.0)
MCH: 30.3 pg (ref 26.0–34.0)
MCHC: 32.6 g/dL (ref 30.0–36.0)
MCV: 93.1 fL (ref 80.0–100.0)
Monocytes Absolute: 1 10*3/uL (ref 0.1–1.0)
Monocytes Relative: 10 %
Neutro Abs: 4.8 10*3/uL (ref 1.7–7.7)
Neutrophils Relative %: 54 %
Platelet Count: 267 10*3/uL (ref 150–400)
RBC: 3.79 MIL/uL — ABNORMAL LOW (ref 4.22–5.81)
RDW: 15 % (ref 11.5–15.5)
WBC Count: 9.1 10*3/uL (ref 4.0–10.5)
nRBC: 0 % (ref 0.0–0.2)

## 2022-04-01 LAB — CMP (CANCER CENTER ONLY)
ALT: 13 U/L (ref 0–44)
AST: 19 U/L (ref 15–41)
Albumin: 3.5 g/dL (ref 3.5–5.0)
Alkaline Phosphatase: 123 U/L (ref 38–126)
Anion gap: 7 (ref 5–15)
BUN: 40 mg/dL — ABNORMAL HIGH (ref 8–23)
CO2: 24 mmol/L (ref 22–32)
Calcium: 10.1 mg/dL (ref 8.9–10.3)
Chloride: 102 mmol/L (ref 98–111)
Creatinine: 1.65 mg/dL — ABNORMAL HIGH (ref 0.61–1.24)
GFR, Estimated: 43 mL/min — ABNORMAL LOW (ref >60)
Glucose, Bld: 313 mg/dL — ABNORMAL HIGH (ref 70–99)
Potassium: 4.5 mmol/L (ref 3.5–5.1)
Sodium: 133 mmol/L — ABNORMAL LOW (ref 135–145)
Total Bilirubin: 0.5 mg/dL (ref 0.3–1.2)
Total Protein: 7 g/dL (ref 6.5–8.1)

## 2022-04-01 MED ORDER — HEPARIN SOD (PORK) LOCK FLUSH 100 UNIT/ML IV SOLN
500.0000 [IU] | Freq: Once | INTRAVENOUS | Status: AC
  Administered 2022-04-01: 500 [IU]

## 2022-04-01 MED ORDER — SODIUM CHLORIDE 0.9% FLUSH
10.0000 mL | Freq: Once | INTRAVENOUS | Status: AC
  Administered 2022-04-01: 10 mL

## 2022-04-01 NOTE — Patient Instructions (Signed)

## 2022-04-01 NOTE — Telephone Encounter (Signed)
Per Dr Benay Spice pt informed to discontinue potassium ?

## 2022-04-01 NOTE — Progress Notes (Signed)
?[name] ?OFFICE PROGRESS NOTE ? ? ?Diagnosis: Pancreas cancer  ? ?INTERVAL HISTORY:  ? ?[name] returns as scheduled.  He continues to have neuropathy symptoms in the hands.  Good appetite.  He is exercising at the gym.  He has pleuritic discomfort at the right upper anterior chest.  No cough.  No dyspnea. ? ?Objective: ? ?Vital signs in last 24 hours: ? ?Blood pressure (!) 157/74, pulse 81, temperature 97.8 ?F (36.6 ?C), temperature source Oral, resp. rate 20, height '5\' 9"'$  (1.753 m), weight 256 lb (116.1 kg), SpO2 100 %. ?  ? ?Lymphatics: No cervical, supraclavicular, axillary, or inguinal nodes ?Resp: Lungs clear bilaterally in the anterior and posterior lung fields, no respiratory distress ?Cardio: Regular rate and rhythm ?GI: No mass, no hepatosplenomegaly ?Vascular: Stasis change at the right lower leg. ? ?Portacath/PICC-without erythema ? ?Lab Results: ? ?Lab Results  ?Component Value Date  ? WBC 9.1 04/01/2022  ? HGB 11.5 (L) 04/01/2022  ? HCT 35.3 (L) 04/01/2022  ? MCV 93.1 04/01/2022  ? PLT 267 04/01/2022  ? NEUTROABS 4.8 04/01/2022  ? ? ?CMP  ?Lab Results  ?Component Value Date  ? NA 133 (L) 04/01/2022  ? K 4.5 04/01/2022  ? CL 102 04/01/2022  ? CO2 24 04/01/2022  ? GLUCOSE 313 (H) 04/01/2022  ? BUN 40 (H) 04/01/2022  ? CREATININE 1.65 (H) 04/01/2022  ? CALCIUM 10.1 04/01/2022  ? PROT 7.0 04/01/2022  ? ALBUMIN 3.5 04/01/2022  ? AST 19 04/01/2022  ? ALT 13 04/01/2022  ? ALKPHOS 123 04/01/2022  ? BILITOT 0.5 04/01/2022  ? GFRNONAA 43 (L) 04/01/2022  ? GFRAA 49 (L) 10/10/2020  ? ? ?Lab Results  ?Component Value Date  ? DQQ229 31 01/07/2022  ? ? ? ? ?Medications: I have reviewed the patient's current medications. ? ? 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 ?EUS 01/15/2021-18 x 23 mm mass in the genu of the pancreas, T2N0, abutment of the splenoportal confluence, changes of chronic pancreatitis, cystic lesion in the pancreas body consistent with a branch intraductal papillary mucinous neoplasm ?Normal CA 19-9 01/24/2021 ?CTs 01/29/2021-no pancreatic mass.  No pancreatic ductal dilatation identified.  No definite signs of metastatic disease in the chest, abdomen or pelvis. ?Cycle 1 gemcitabine/Abraxane 03/01/2021 ?Cycle 2 gemcitabine/Abraxane 03/14/2021 ?Cycle 3 gemcitabine/Abraxane 03/28/2021 ?Cycle 4 gemcitabine/Abraxane 04/17/2021 ?Cycle 5 gemcitabine/Abraxane 05/01/2021, Zofran/Decadron added ?Cycle 6 gemcitabine/Abraxane 05/15/2021 ?CT pancreas protocol 05/27/2021-unchanged 8 mm exophytic low-attenuation lesion at the posterior body of the pancreas ?07/01/2021-distal pancreatectomy/splenectomy, 0.8 cm poorly differentiated adenocarcinoma, treatment response score-2, largest single foci of remaining tumor 0.2 cm, negative resection margins, 0/6 lymph nodes,ypT1bypNo, PanIN-1b ?Cycle 7 gemcitabine/Abraxane 08/14/2021 ?Cycle 8 gemcitabine/Abraxane 09/04/2021 ?Cycle 9 gemcitabine/Abraxane 09/18/2021 ?Cycle 10 gemcitabine/Abraxane 10/01/2021 ?Cycle 11 gemcitabine/Abraxane 10/16/2021 ?Cycle 12 gemcitabine/Abraxane 10/29/2021, Abraxane held secondary to neuropathy ?CT abdomen/pelvis 02/06/2022-acute interstitial edematous pancreatitis with small 1.6 cm acute peripancreatic fluid collection within the superior aspect of the pancreatic head; interval postoperative changes of distal pancreatectomy and splenectomy; stable benign left adrenal adenoma ?Diabetes ?Coronary artery disease ?Prostate cancer 2013-[facility] ?Hypertension ?Sleep apnea ?7.  Coronary artery bypass surgery 2004 ?8.  Hospital admission 03/04/2021-syncope, A. Fib-started on Eliquis ?9.  Mild thrombocytopenia following cycle 1  gemcitabine/Abraxane-resolved ?10.  Anemia ?11.  Admission 09/22/2021  with a right metatarsal ulcer and right leg cellulitis ?12.  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  ? ?Disposition: ?[name] is in clinical remission from pancreas cancer.  He would like to keep the Port-A-Cath in place.  He will return for a Port-A-Cath flush in 6 weeks and an office visit in 12 weeks. ?The creatinine is higher today.  We will forward this result to Dr. [name].  He will discontinue potassium.  I recommended he follow-up with Dr. [name] to evaluate the elevated creatinine.  We will check a chemistry panel when he is here in 6 weeks. ? ?Betsy Coder, MD ? ?04/01/2022  ?11:22 AM ? ? ?

## 2022-04-01 NOTE — Telephone Encounter (Signed)
Faxed over lab # [phone]. ?

## 2022-04-01 NOTE — Telephone Encounter (Signed)
-----   Message from Owens Shark, NP sent at 04/01/2022 11:44 AM EDT ----- ?Please forward a copy of today's labs to PCP. ? ?

## 2022-04-02 LAB — CANCER ANTIGEN 19-9: CA 19-9: 25 U/mL (ref 0–35)

## 2022-04-04 ENCOUNTER — Other Ambulatory Visit: Payer: Self-pay | Admitting: *Deleted

## 2022-04-08 ENCOUNTER — Encounter: Payer: Self-pay | Admitting: Oncology

## 2022-04-08 ENCOUNTER — Ambulatory Visit (INDEPENDENT_AMBULATORY_CARE_PROVIDER_SITE_OTHER): Payer: Medicare Other | Admitting: Family Medicine

## 2022-04-08 ENCOUNTER — Encounter: Payer: Self-pay | Admitting: Family Medicine

## 2022-04-08 VITALS — BP 120/70 | HR 100 | Resp 16 | Ht 69.0 in | Wt 246.2 lb

## 2022-04-08 DIAGNOSIS — Z794 Long term (current) use of insulin: Secondary | ICD-10-CM

## 2022-04-08 DIAGNOSIS — E278 Other specified disorders of adrenal gland: Secondary | ICD-10-CM | POA: Diagnosis not present

## 2022-04-08 DIAGNOSIS — N183 Chronic kidney disease, stage 3 unspecified: Secondary | ICD-10-CM

## 2022-04-08 DIAGNOSIS — I2510 Atherosclerotic heart disease of native coronary artery without angina pectoris: Secondary | ICD-10-CM | POA: Diagnosis not present

## 2022-04-08 DIAGNOSIS — E1122 Type 2 diabetes mellitus with diabetic chronic kidney disease: Secondary | ICD-10-CM | POA: Diagnosis not present

## 2022-04-08 DIAGNOSIS — I119 Hypertensive heart disease without heart failure: Secondary | ICD-10-CM | POA: Diagnosis not present

## 2022-04-08 DIAGNOSIS — Z9081 Acquired absence of spleen: Secondary | ICD-10-CM | POA: Diagnosis not present

## 2022-04-08 DIAGNOSIS — N1831 Chronic kidney disease, stage 3a: Secondary | ICD-10-CM | POA: Diagnosis not present

## 2022-04-08 LAB — POCT GLYCOSYLATED HEMOGLOBIN (HGB A1C): HbA1c, POC (controlled diabetic range): 9.9 % — AB (ref 0.0–7.0)

## 2022-04-08 MED ORDER — INSULIN LISPRO 100 UNIT/ML IJ SOLN: INTRAMUSCULAR | 11 refills | Status: DC

## 2022-04-08 MED ORDER — INSULIN GLARGINE 100 UNIT/ML ~~LOC~~ SOLN: 45.0000 [IU] | Freq: Every day | SUBCUTANEOUS | 3 refills | Status: DC

## 2022-04-08 MED ORDER — DEXCOM G6 SENSOR MISC: 1 refills | Status: DC

## 2022-04-08 MED ORDER — DEXCOM G6 TRANSMITTER MISC: 1.0000 | 3 refills | Status: DC

## 2022-04-08 NOTE — Patient Instructions (Addendum)
A few things to remember from today's visit: ? ?Stage 3a chronic kidney disease ([place]) ? ?Hypertension with heart disease ? ?Type 2 diabetes mellitus with stage 3 chronic kidney disease, with long-term current use of insulin, unspecified whether stage 3a or 3b CKD ([place]) - Plan: POC HgB A1c ? ?If you need refills please call your pharmacy. ?Do not use My Chart to request refills or for acute issues that need immediate attention. ? Let's start with sort acting insulin before lunch. Give 5 U about 10 min before or with lunch if blood sugar above 120. Bring blood sugars when you have lab appt, so I can make recommendations about continuing insulin. ?Lantus decreased from 50 U to 45 U. ?It is important to be consistent with meals. ? ?Please be sure medication list is accurate. ?If a new problem present, please set up appointment sooner than planned today. ? ? ? ? ? ? ? ?

## 2022-04-08 NOTE — Assessment & Plan Note (Signed)
Pneumovax and Hib completed. ?Menactra every 5 years and meningococcal serotype B vaccine q 2-3 years. ? ?

## 2022-04-08 NOTE — Assessment & Plan Note (Signed)
Cr went from 1.2-1.3 to 1.5-1.6.  ?Stressed the importance of adequate BP and glucose control. ?Low salt diet and adequate hydration. ?Continue avoidance of NSAID's. ?For now no changes in Diovan. ?BMP in 10 days. ?

## 2022-04-08 NOTE — Assessment & Plan Note (Addendum)
Problem is not well controlled. ?HgA1C went from 6.9 to 9.9. ?We discussed the importance of following dietary recommendations and been consistent with meal time. ?He has had some FBS's in the 60's , decrease Lantus from 50 U to 45 U. ?Insulin Lispro 5 U before lunch, which is his biggest meal. He received BS logs, instructed to bring BS's (fasting, pre prandial,and post prandial) then we can consider adding Humalog before dinner and/or breakfast. ?Dexcon (continues glucose monitor) sent. ?Annual eye exam, periodic dental and foot care to continue. ?F/U in 3-4 months ? ?

## 2022-04-08 NOTE — Assessment & Plan Note (Signed)
BP adequately controlled. ?No changes in Amlodipine and Diovan same dose. ?Low salt diet. ?Continue monitoring BP regularly. ?

## 2022-04-08 NOTE — Progress Notes (Signed)
? ? ?ACUTE VISIT ?Chief Complaint  ?Patient presents with  ? Follow-up  ?  Creatinine level high  ? ?HPI: ?[name] is a 76 y.o. male, who is here today with his wife. He is concerned about worsening Cr and e GFR. ?He has labs done regularly at his oncologist's office. ?He has not noted gross hematuria,foam in urine,or decreased urine output. ?LE edema stable. ?No new medications. ?HTN on Valsartan 320 mg daily, Amlodipine 5 mg daily,and Hydralazine 25 mg tid. ?BP's at home 130's/70-80's. ? ?Lab Results  ?Component Value Date  ? CREATININE 1.65 (H) 04/01/2022  ? BUN 40 (H) 04/01/2022  ? NA 133 (L) 04/01/2022  ? K 4.5 04/01/2022  ? CL 102 04/01/2022  ? CO2 24 04/01/2022  ? ?Cr usually around 1.2-1.3 and e GFR 50's. ?02/21/22: Cr 1.56 and e GFR 42.6. ?04/01/22: Cr 1.65 and e GFR 43. ? ?Pancreatic cancer,poorly differentiated carcinoma, s/p distal pancreatomy and splenectomy. 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 ? ?CT abdomen/pelvis 02/06/2022-acute interstitial edematous pancreatitis with small 1.6 cm acute peripancreatic fluid collection within the superior aspect of the pancreatic head; interval postoperative changes of distal pancreatectomy and splenectomy; stable benign left adrenal adenoma ? ?Glucose has been in the 300's (lab). Home BS's have been in the 60's. Some 200's, later during the day. ?He is on Lantus 50 U, he decreases or skips dose depending on BS's. ?He has not been consistent with his diet. ?Sugar coated cereal for breakfast and sometimes at night, skips meals sometimes because he is not hungry. ?Negative for abdominal pain, nausea,vomiting, polydipsia,polyuria, or polyphagia. ? ?Lab Results  ?Component Value Date  ? HGBA1C 6.9 01/06/2022  ? ?Review of Systems  ?Constitutional:  Negative for activity change, appetite change, fatigue and fever.  ?HENT:  Negative for nosebleeds and sore throat.   ?Eyes:  Negative for redness and visual disturbance.   ?Respiratory:  Negative for cough, shortness of breath and wheezing.   ?Cardiovascular:  Positive for leg swelling (No more than usual). Negative for chest pain.  ?Neurological:  Negative for syncope, weakness and headaches.  ?Rest see pertinent positives and negatives per HPI. ? ?Current Outpatient Medications on File Prior to Visit  ?Medication Sig Dispense Refill  ? acetaminophen (TYLENOL) 325 MG tablet Take 2 tablets (650 mg total) by mouth every 6 (six) hours as needed for mild pain. (Patient taking differently: Take 650 mg by mouth every 6 (six) hours as needed for mild pain or fever.)    ? amLODipine (NORVASC) 5 MG tablet Take 1 tablet (5 mg total) by mouth daily. 90 tablet 3  ? apixaban (ELIQUIS) 5 MG TABS tablet Take 5 mg by mouth 2 (two) times daily.    ? atorvastatin (LIPITOR) 80 MG tablet Take 40 mg by mouth at bedtime.    ? calcium-vitamin D (OSCAL WITH D) 500-200 MG-UNIT tablet Take 1 tablet by mouth 2 (two) times daily.    ? clotrimazole-betamethasone (LOTRISONE) cream Apply 1 application topically daily as needed. 45 g 2  ? cycloSPORINE (RESTASIS) 0.05 % ophthalmic emulsion Place 1 drop into both eyes 2 (two) times daily as needed (dry eyes).    ? ferrous sulfate 325 (65 FE) MG tablet Take 1 tablet (325 mg total) by mouth daily with breakfast. 90 tablet 2  ? FLUoxetine (PROZAC) 20 MG capsule Take 40 mg by mouth daily.    ? fluticasone (FLONASE) 50 MCG/ACT nasal spray Place 1 spray into both nostrils daily.    ? furosemide (  LASIX) 20 MG tablet Take 1 tablet (20 mg total) by mouth daily. 90 tablet 3  ? glucose blood (ONETOUCH VERIO) test strip Use to test blood sugars 1-2 times daily. 200 each 12  ? hydrALAZINE (APRESOLINE) 25 MG tablet Take 1 tablet (25 mg total) by mouth 3 (three) times daily. 270 tablet 1  ? lidocaine-prilocaine (EMLA) cream Apply 1 application. topically as needed.    ? loratadine (CLARITIN) 10 MG tablet Take 10 mg by mouth daily.    ? Multiple Vitamin (MULTI-VITAMINS) TABS Take 1  tablet by mouth daily.    ? nitroGLYCERIN (NITROSTAT) 0.3 MG SL tablet Place 0.3 mg under the tongue every 5 (five) minutes as needed for chest pain.    ? nystatin powder Apply 1 application topically 3 (three) times daily. 15 g 2  ? pantoprazole (PROTONIX) 40 MG tablet Take 1 tablet (40 mg total) by mouth daily. 90 tablet 3  ? tamsulosin (FLOMAX) 0.4 MG CAPS capsule Take 0.8 mg by mouth at bedtime.    ? traZODone (DESYREL) 100 MG tablet Take 100 mg by mouth at bedtime.    ? valsartan (DIOVAN) 320 MG tablet Take 1 tablet (320 mg total) by mouth daily. 90 tablet 3  ? vitamin B-12 (CYANOCOBALAMIN) 1000 MCG tablet Take 1,000 mcg by mouth daily.    ? Wheat Dextrin (BENEFIBER DRINK MIX PO) Take by mouth.    ? ?No current facility-administered medications on file prior to visit.  ? ?Past Medical History:  ?Diagnosis Date  ? A-fib ([place]) 03/04/2021  ? Anemia   ? Arthritis   ? Cancer [facility])   ? prostate  ? Chronic kidney disease   ? blood in urine   ? Constipation   ? Coronary artery disease   ? Depression   ? Diabetes mellitus without complication ([place])   ? Difficult intubation   ? 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 it was hard to get the tube down his throat  ? Dyspnea   ? Family history of breast cancer   ? Fatty liver   ? Fatty liver   ? Frequent headaches   ? GERD (gastroesophageal reflux disease)   ? History of chicken pox   ? History of fainting spells of unknown cause   ? History of prostate cancer   ? Hyperlipidemia   ? Hypertension   ? Myocardial infarction [facility])   ? Peripheral neuropathy   ? Pneumonia   ? Prostate cancer ([place])   ? PTSD (post-traumatic stress disorder)   ? Sleep apnea   ? uses Cpap  ? ?Allergies  ?Allergen Reactions  ? Keflex [Cephalexin] Other (See Comments)  ?  Hallucinations?  ? Lisinopril Other (See Comments)  ?  Unknown  ? Terazosin Other (See Comments)  ?  Unknown  ? ? ?Social History  ? ?Socioeconomic History  ? Marital status: Married  ?  Spouse name: Not on file  ? Number of children: 2  ? Years of  education: Not on file  ? Highest education level: Not on file  ?Occupational History  ? Occupation: retired  ?Tobacco Use  ? Smoking status: Former  ?  Types: Cigarettes  ?  Quit date: 12/14/1975  ?  Years since quitting: 46.3  ? Smokeless tobacco: Never  ?Vaping Use  ? Vaping Use: Not on file  ?Substance and Sexual Activity  ? Alcohol use: Not Currently  ? Drug use: Never  ? Sexual activity: Not Currently  ?Other Topics Concern  ? Not on file  ?  Social History Narrative  ? ** Merged History Encounter **  ?    ? ?Social Determinants of Health  ? ?Financial Resource Strain: Low Risk   ? Difficulty of Paying Living Expenses: Not hard at all  ?Food Insecurity: No Food Insecurity  ? Worried About Charity fundraiser in the Last Year: Never true  ? Ran Out of Food in the Last Year: Never true  ?Transportation Needs: No Transportation Needs  ? Lack of Transportation (Medical): No  ? Lack of Transportation (Non-Medical): No  ?Physical Activity: Insufficiently Active  ? Days of Exercise per Week: 2 days  ? Minutes of Exercise per Session: 60 min  ?Stress: No Stress Concern Present  ? Feeling of Stress : Not at all  ?Social Connections: Moderately Isolated  ? Frequency of Communication with Friends and Family: More than three times a week  ? Frequency of Social Gatherings with Friends and Family: More than three times a week  ? Attends Religious Services: Never  ? Active Member of Clubs or Organizations: No  ? Attends Archivist Meetings: Never  ? Marital Status: Married  ? ?Vitals:  ? 04/08/22 1410  ?BP: 120/70  ?Pulse: 100  ?Resp: 16  ?SpO2: 97%  ? ?Wt Readings from Last 3 Encounters:  ?04/08/22 246 lb 4 oz (111.7 kg)  ?04/01/22 256 lb (116.1 kg)  ?02/21/22 237 lb (107.5 kg)  ? ?Body mass index is 36.36 kg/m?. ? ?Physical Exam ?Vitals and nursing note reviewed.  ?Constitutional:   ?   General: He is not in acute distress. ?   Appearance: He is well-developed.  ?HENT:  ?   Head: Normocephalic and atraumatic.   ?Eyes:  ?   Conjunctiva/sclera: Conjunctivae normal.  ?Cardiovascular:  ?   Rate and Rhythm: Normal rate and regular rhythm.  ?   Heart sounds: No murmur heard. ?Pulmonary:  ?   Effort: Pulmonary effort is normal. No r

## 2022-04-10 ENCOUNTER — Telehealth: Payer: Self-pay | Admitting: Family Medicine

## 2022-04-10 NOTE — Telephone Encounter (Signed)
Pt's prescription for insulin lispro (HUMALOG) 100 UNIT/ML injection has no directions, for insurance to fill this they need the frequency ?

## 2022-04-12 DIAGNOSIS — E278 Other specified disorders of adrenal gland: Secondary | ICD-10-CM | POA: Insufficient documentation

## 2022-04-15 ENCOUNTER — Encounter: Payer: Self-pay | Admitting: Oncology

## 2022-04-15 ENCOUNTER — Telehealth: Payer: Self-pay | Admitting: Family Medicine

## 2022-04-15 DIAGNOSIS — E1122 Type 2 diabetes mellitus with diabetic chronic kidney disease: Secondary | ICD-10-CM

## 2022-04-15 MED ORDER — INSULIN LISPRO 100 UNIT/ML IJ SOLN: INTRAMUSCULAR | 11 refills | Status: DC

## 2022-04-15 NOTE — Telephone Encounter (Signed)
Pt's prescription for insulin lispro (HUMALOG) 100 UNIT/ML injection has no directions, for insurance to fill this they need the frequency ?

## 2022-04-15 NOTE — Telephone Encounter (Signed)
Prescription fixed

## 2022-04-16 DIAGNOSIS — C259 Malignant neoplasm of pancreas, unspecified: Secondary | ICD-10-CM | POA: Diagnosis not present

## 2022-04-16 DIAGNOSIS — K859 Acute pancreatitis without necrosis or infection, unspecified: Secondary | ICD-10-CM | POA: Diagnosis not present

## 2022-04-18 ENCOUNTER — Other Ambulatory Visit (INDEPENDENT_AMBULATORY_CARE_PROVIDER_SITE_OTHER): Payer: Medicare Other

## 2022-04-18 DIAGNOSIS — N1831 Chronic kidney disease, stage 3a: Secondary | ICD-10-CM

## 2022-04-18 LAB — BASIC METABOLIC PANEL
BUN: 43 mg/dL — ABNORMAL HIGH (ref 6–23)
CO2: 24 mEq/L (ref 19–32)
Calcium: 9.5 mg/dL (ref 8.4–10.5)
Chloride: 104 mEq/L (ref 96–112)
Creatinine, Ser: 1.64 mg/dL — ABNORMAL HIGH (ref 0.40–1.50)
GFR: 40.1 mL/min — ABNORMAL LOW (ref >60.00)
Glucose, Bld: 137 mg/dL — ABNORMAL HIGH (ref 70–99)
Potassium: 4.7 mEq/L (ref 3.5–5.1)
Sodium: 137 mEq/L (ref 135–145)

## 2022-04-22 ENCOUNTER — Other Ambulatory Visit: Payer: Self-pay

## 2022-04-22 DIAGNOSIS — Z794 Long term (current) use of insulin: Secondary | ICD-10-CM

## 2022-04-22 MED ORDER — INSULIN LISPRO 100 UNIT/ML IJ SOLN: INTRAMUSCULAR | 11 refills | Status: DC

## 2022-04-22 NOTE — Progress Notes (Signed)
Correction: Amlodipine 10 mg not 20. ?Idalia Allbritton [name], MD ? ?

## 2022-04-23 ENCOUNTER — Telehealth: Payer: Self-pay | Admitting: Family Medicine

## 2022-04-23 NOTE — Telephone Encounter (Signed)
PT wife is calling and would like to speak with sarah concerning the change in her husband bp med ?

## 2022-04-23 NOTE — Telephone Encounter (Signed)
I called and spoke with patient's wife. We went over her questions and all were answered.  ?

## 2022-04-24 ENCOUNTER — Other Ambulatory Visit: Payer: Self-pay | Admitting: Surgery

## 2022-04-24 DIAGNOSIS — K859 Acute pancreatitis without necrosis or infection, unspecified: Secondary | ICD-10-CM

## 2022-04-24 DIAGNOSIS — C259 Malignant neoplasm of pancreas, unspecified: Secondary | ICD-10-CM

## 2022-05-04 NOTE — Progress Notes (Signed)
Office Visit    Patient Name: [name] Date of Encounter: 05/05/2022  PCP:  [name], [name] G, MD   [facility]  Cardiologist:  Buford Dresser, MD  Advanced Practice Provider:  No care team member to display Electrophysiologist:  None      Chief Complaint    [name] is a 76 y.o. male with a hx of orthostatic hypotension, CAD s/p CABG 2004, DM 2, hyperlipidemia, atrial fibrillation on anticoagulation, CKD, pancreatic cancer, pancreatitis presents today for hypertension follow up.   Past Medical History    Past Medical History:  Diagnosis Date   A-fib ([place]) 03/04/2021   Anemia    Arthritis    Cancer ([facility])    prostate   Chronic kidney disease    blood in urine    Constipation    Coronary artery disease    Depression    Diabetes mellitus without complication ([place])    Difficult intubation    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 it was hard to get the tube down his throat   Dyspnea    Family history of breast cancer    Fatty liver    Fatty liver    Frequent headaches    GERD (gastroesophageal reflux disease)    History of chicken pox    History of fainting spells of unknown cause    History of prostate cancer    Hyperlipidemia    Hypertension    Myocardial infarction [facility])    Peripheral neuropathy    Pneumonia    Prostate cancer ([place])    PTSD (post-traumatic stress disorder)    Sleep apnea    uses Cpap   Past Surgical History:  Procedure Laterality Date   APPENDECTOMY     BIOPSY  01/15/2021   Procedure: BIOPSY;  Surgeon: Irving Copas., MD;  Location: [place];  Service: Gastroenterology;;   CARDIAC CATHETERIZATION  06/26/2017   CARDIAC SURGERY     Triple Bypass   CHOLECYSTECTOMY  2010   COLONOSCOPY     CORONARY ARTERY BYPASS GRAFT  2004   ESOPHAGOGASTRODUODENOSCOPY (EGD) WITH PROPOFOL N/A 01/15/2021   Procedure: ESOPHAGOGASTRODUODENOSCOPY (EGD) WITH PROPOFOL;  Surgeon: Irving Copas., MD;   Location[facility] ENDOSCOPY;  Service: Gastroenterology;  Laterality: N/A;   EUS N/A 01/15/2021   Procedure: UPPER ENDOSCOPIC ULTRASOUND (EUS) RADIAL;  Surgeon: Irving Copas., MD;  Location: [place];  Service: Gastroenterology;  Laterality: N/A;   EYE SURGERY Bilateral    cataract removal   FINE NEEDLE ASPIRATION  01/15/2021   Procedure: FINE NEEDLE ASPIRATION (FNA) LINEAR;  Surgeon: Irving Copas., MD;  Location[facility] ENDOSCOPY;  Service: Gastroenterology;;   LAPAROSCOPY N/A 07/01/2021   Procedure: STAGING LAPAROSCOPY;  Surgeon: Dwan Bolt, MD;  Location: [place];  Service: General;  Laterality: N/A;   PORTACATH PLACEMENT Right 02/21/2021   Procedure: INSERTION PORT-A-CATH;  Surgeon: Dwan Bolt, MD;  Location: [place];  Service: General;  Laterality: Right;   SPLENECTOMY, 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   Procedure: SPLENECTOMY;  Surgeon: Dwan Bolt, MD;  Location: [place];  Service: General;  Laterality: N/A;   TONSILLECTOMY  1958    Allergies  Allergies  Allergen Reactions   Keflex [Cephalexin] Other (See Comments)    Hallucinations?   Lisinopril Other (See Comments)    Unknown   Terazosin Other (See Comments)    Unknown    History of Present Illness    [name] is a 76 y.o. male with a  hx of rthostatic hypotension, CAD s/p CABG 2004, DM 2, hyperlipidemia, atrial fibrillation on anticoagulation, CKD, pancreatic cancer, pancreatitis last seen 01/29/22 by Christen Bame, NP.  Previously followed by Dr. Lucia Gaskins at [facility] in [place].  Relocated to [place] in 2019 from [place], [place] to be closer to his daughter and grandchild.  Cardiac catheterization July 2019 with 90% proximal LAD, occluded nondominant left circumflex, occluded RCA.  He had patent grafts including LIMA-LAD, SVG-RCA and SVG to ramus with 40 to 50% proximal stenosis.  Normal LVEF and LVEDP.  He is a [place] veteran with history of PTSD and agent orange exposure.  Previously on  midodrine for orthostatic hypotension which eventually resolved and midodrine was discontinued.  Baseline creatinine 1.4-1.5.  He had pancreatectomy and splenectomy for pancreatic cancer July 2022.  October 2022 concern for elevated blood pressure and losartan was increased to 100 mg daily.  11/2021 BP remained elevated and amlodipine 2.5 mg daily was started.  Clinic visit 12/30/2021 transition to valsartan due to elevated blood pressure.  01/16/2022 amlodipine increased to 5 mg daily.  At follow-up 01/29/2022 noted he had started working out [employer] which he enjoyed. BP elevated at home but well controlled in clinic with reading 130/80.  Due to lower extremity edema furosemide 20 mg daily was initiated with repeat BMP in 1 week. On 02/24/22 Dr. [name] added Hydralazine '25mg'$  TID.  04/18/22 labwork by PCP revealed creatinine 1.64, GFR 40. Valsartan was reduced to '160mg'$  and recommended to increase Amlodipine to '10mg'$  if BP elevated.    He presents today for follow up with his wife.  Endorses feeling overall well since last seen. Has reduced Valsartan to '160mg'$  QD but not had to increase Amlodipine.  He brings a log of blood pressures.  When averaging his previous 20 readings as well as accounting for the fact that his home blood pressure cuff is known to be  +25mHg his average systolic blood pressure is 124. His diastolic is routinely [id number] Reports no shortness of breath nor dyspnea on exertion. Reports no chest pain, pressure, or tightness. No edema, orthopnea, PND. Reports no palpitations.    EKGs/Labs/Other Studies Reviewed:   The following studies were reviewed today:  EKG:  No EKG today.  Recent Labs: 09/27/2021: Magnesium 1.8 04/01/2022: ALT 13; Hemoglobin 11.5; Platelet Count 267 04/18/2022: BUN 43; Creatinine, Ser 1.64; Potassium 4.7; Sodium 137  Recent Lipid Panel    Component Value Date/Time   CHOL 141 02/21/2022 1156   CHOL 101 12/16/2018 1003   TRIG 140.0 02/21/2022 1156   HDL 46.80  02/21/2022 1156   HDL 38 (L) 12/16/2018 1003   CHOLHDL 3 02/21/2022 1156   VLDL 28.0 02/21/2022 1156   LDLCALC 66 02/21/2022 1156   LDLCALC 55 10/10/2020 0940     Home Medications   Current Meds  Medication Sig   acetaminophen (TYLENOL) 500 MG tablet Take 1,000 mg by mouth 2 (two) times daily.   amLODipine (NORVASC) 5 MG tablet Take 1 tablet (5 mg total) by mouth daily.   apixaban (ELIQUIS) 5 MG TABS tablet Take 5 mg by mouth 2 (two) times daily.   atorvastatin (LIPITOR) 80 MG tablet Take 40 mg by mouth at bedtime.   calcium-vitamin D (OSCAL WITH D) 500-200 MG-UNIT tablet Take 1 tablet by mouth 2 (two) times daily.   clotrimazole-betamethasone (LOTRISONE) cream Apply 1 application topically daily as needed.   Continuous Blood Gluc Sensor (DEXCOM G6 SENSOR) MISC Change sensor q 10 days.   Continuous Blood Gluc Transmit (  DEXCOM G6 TRANSMITTER) MISC 1 Device by Does not apply route every 3 (three) months.   cycloSPORINE (RESTASIS) 0.05 % ophthalmic emulsion Place 1 drop into both eyes 2 (two) times daily as needed (dry eyes).   ferrous sulfate 325 (65 FE) MG tablet Take 1 tablet (325 mg total) by mouth daily with breakfast.   FLUoxetine (PROZAC) 20 MG capsule Take 40 mg by mouth daily.   fluticasone (FLONASE) 50 MCG/ACT nasal spray Place 1 spray into both nostrils daily.   glucose blood (ONETOUCH VERIO) test strip Use to test blood sugars 1-2 times daily.   hydrALAZINE (APRESOLINE) 25 MG tablet Take 1 tablet (25 mg total) by mouth 3 (three) times daily.   insulin glargine (LANTUS) 100 UNIT/ML injection Inject 0.45 mLs (45 Units total) into the skin daily.   insulin lispro (HUMALOG) 100 UNIT/ML injection 5 Units before lunch and 3-5 units before dinner.   lidocaine-prilocaine (EMLA) cream Apply 1 application. topically as needed.   loratadine (CLARITIN) 10 MG tablet Take 10 mg by mouth daily.   Multiple Vitamin (MULTI-VITAMINS) TABS Take 1 tablet by mouth daily.   nitroGLYCERIN  (NITROSTAT) 0.3 MG SL tablet Place 0.3 mg under the tongue every 5 (five) minutes as needed for chest pain.   nystatin powder Apply 1 application topically 3 (three) times daily.   pantoprazole (PROTONIX) 40 MG tablet Take 1 tablet (40 mg total) by mouth daily.   tamsulosin (FLOMAX) 0.4 MG CAPS capsule Take 0.8 mg by mouth at bedtime.   traZODone (DESYREL) 100 MG tablet Take 100 mg by mouth at bedtime.   vitamin B-12 (CYANOCOBALAMIN) 1000 MCG tablet Take 1,000 mcg by mouth daily.   Wheat Dextrin (BENEFIBER DRINK MIX PO) Take by mouth daily.   [DISCONTINUED] acetaminophen (TYLENOL) 325 MG tablet Take 2 tablets (650 mg total) by mouth every 6 (six) hours as needed for mild pain. (Patient taking differently: Take 650 mg by mouth 2 (two) times daily.)   [DISCONTINUED] valsartan (DIOVAN) 320 MG tablet Take 1 tablet (320 mg total) by mouth daily. (Patient taking differently: Take 160 mg by mouth daily.)     Review of Systems     All other systems reviewed and are otherwise negative except as noted above.  Physical Exam    VS:  BP 120/64   Pulse 70   Ht '5\' 11"'$  (1.803 m)   Wt 248 lb (112.5 kg)   SpO2 97%   BMI 34.59 kg/m  , BMI Body mass index is 34.59 kg/m.  Wt Readings from Last 3 Encounters:  05/05/22 248 lb (112.5 kg)  04/08/22 246 lb 4 oz (111.7 kg)  04/01/22 256 lb (116.1 kg)     GEN: Well nourished, overweight, well developed, in no acute distress. HEENT: normal. Neck: Supple, no JVD, carotid bruits, or masses. Cardiac: RRR, no murmurs, rubs, or gallops. No clubbing, cyanosis, edema.  Radials/PT 2+ and equal bilaterally.  Respiratory:  Respirations regular and unlabored, clear to auscultation bilaterally. GI: Soft, nontender, nondistended. MS: No deformity or atrophy. Skin: Warm and dry, no rash. Neuro:  Strength and sensation are intact. Psych: Normal affect.  Assessment & Plan    HTN - BP well controlled. Continue current antihypertensive regimen including valsartan 160  mg daily (Rx provided), Lasix 20 mg daily, hydralazine 25 mg 3 times daily, amlodipine 5 mg daily.  Future considerations include consolidation of hydralazine/amlodipine however given his blood pressure is well controlled will defer changes today.  CAD  s/p CABG - Stable with no  anginal symptoms. No indication for ischemic evaluation.  GDMT includes atorvastatin, amlodipine.  No aspirin due to chronic anticoagulation. Heart healthy diet and regular cardiovascular exercise encouraged.    PAF / Hypercoagulable state -maintaining sinus rhythm by auscultation today.  Not requiring AV nodal blocking agent.  Continue Eliquis 5 mg twice daily.  Does not meet dose reduction criteria. CHA2DS2-VASc Score = 5 [CHF History: 0, HTN History: 1, Diabetes History: 1, Stroke History: 0, Vascular Disease History: 1, Age Score: 2, Gender Score: 0].  Therefore, the patient's annual risk of stroke is 7.2 %.      CKD - Careful titration of diuretic and antihypertensive. Previous creatinine baseline 1.2-1.4 now up to 1.6. PCP has reduced Valsartan to '160mg'$  QD. Upcoming BMP 05/13/22 with oncology.   DM2 - Continue to follow with PCP.   LE edema -well-controlled on Lasix 20 mg daily.  Encouraged to elevate legs when sitting.  He follows a low-sodium diet.  HLD, LDL goal <70 -continue atorvastatin 80 mg daily.  Denies myalgias.  Ins[facility] -[facility].  Notes on occasion he will have difficulty falling asleep as just feels uncomfortable in his legs.  He does take Tylenol before bedtime.  Encouraged good sleep habits and [facility]-handout provided on tips for better sleep.  Disposition: Follow up in 4 month(s) with Buford Dresser, MD or APP.  Signed, Loel Dubonnet, NP 05/05/2022, 12:40 PM [place]

## 2022-05-05 ENCOUNTER — Encounter (HOSPITAL_BASED_OUTPATIENT_CLINIC_OR_DEPARTMENT_OTHER): Payer: Self-pay | Admitting: Family

## 2022-05-05 ENCOUNTER — Ambulatory Visit (INDEPENDENT_AMBULATORY_CARE_PROVIDER_SITE_OTHER): Payer: Medicare Other | Admitting: Family

## 2022-05-05 VITALS — BP 120/64 | HR 70 | Ht 71.0 in | Wt 248.0 lb

## 2022-05-05 DIAGNOSIS — I10 Essential (primary) hypertension: Secondary | ICD-10-CM

## 2022-05-05 DIAGNOSIS — N183 Chronic kidney disease, stage 3 unspecified: Secondary | ICD-10-CM

## 2022-05-05 DIAGNOSIS — E1159 Type 2 diabetes mellitus with other circulatory complications: Secondary | ICD-10-CM

## 2022-05-05 DIAGNOSIS — Z951 Presence of aortocoronary bypass graft: Secondary | ICD-10-CM

## 2022-05-05 MED ORDER — VALSARTAN 160 MG PO TABS: 160.0000 mg | ORAL_TABLET | Freq: Every day | ORAL | 3 refills | Status: DC

## 2022-05-05 NOTE — Patient Instructions (Addendum)
Medication Instructions:  Continue your current medications.   *If you need a refill on your cardiac medications before your next appointment, please call your pharmacy*   Lab Work: None ordered today.   Testing/Procedures: None ordered today.   Follow-Up: At [facility], you and your health needs are our priority.  As part of our continuing mission to provide you with exceptional heart care, we have created designated Provider Care Teams.  These Care Teams include your primary Cardiologist (physician) and Advanced Practice Providers (APPs -  Physician Assistants and Nurse Practitioners) who all work together to provide you with the care you need, when you need it.  We recommend signing up for the patient portal called "MyChart".  Sign up information is provided on this After Visit Summary.  MyChart is used to connect with patients for Virtual Visits (Telemedicine).  Patients are able to view lab/test results, encounter notes, upcoming appointments, etc.  Non-urgent messages can be sent to your provider as well.   To learn more about what you can do with MyChart, go to [email or web address].    Your next appointment:   4 month(s)  The format for your next appointment:   In Person  Provider:   Buford Dresser, MD    Other Instructions Heart Healthy Diet Recommendations: A low-salt diet is recommended. Meats should be grilled, baked, or boiled. Avoid fried foods. Focus on lean protein sources like fish or chicken with vegetables and fruits. The American Heart Association is a Microbiologist!  American Heart Association Diet and Lifeystyle Recommendations    Exercise recommendations: The American Heart Association recommends 150 minutes of moderate intensity exercise weekly. Try 30 minutes of moderate intensity exercise 4-5 times per week. This could include walking, jogging, or swimming.   Important Information About Sugar

## 2022-05-06 ENCOUNTER — Other Ambulatory Visit: Payer: Self-pay

## 2022-05-06 ENCOUNTER — Telehealth: Payer: Self-pay

## 2022-05-06 DIAGNOSIS — Z95828 Presence of other vascular implants and grafts: Secondary | ICD-10-CM

## 2022-05-06 NOTE — Telephone Encounter (Signed)
Orders are in for Portacath access

## 2022-05-07 ENCOUNTER — Encounter: Payer: Self-pay | Admitting: Oncology

## 2022-05-13 ENCOUNTER — Inpatient Hospital Stay: Payer: Medicare Other | Attending: Nurse Practitioner

## 2022-05-13 ENCOUNTER — Inpatient Hospital Stay: Payer: Medicare Other

## 2022-05-13 VITALS — BP 137/66 | HR 69 | Temp 98.3°F | Resp 20

## 2022-05-13 DIAGNOSIS — Z95828 Presence of other vascular implants and grafts: Secondary | ICD-10-CM

## 2022-05-13 DIAGNOSIS — R7989 Other specified abnormal findings of blood chemistry: Secondary | ICD-10-CM | POA: Diagnosis not present

## 2022-05-13 DIAGNOSIS — C251 Malignant neoplasm of body of pancreas: Secondary | ICD-10-CM | POA: Diagnosis not present

## 2022-05-13 DIAGNOSIS — Z9081 Acquired absence of spleen: Secondary | ICD-10-CM

## 2022-05-13 DIAGNOSIS — K2960 Other gastritis without bleeding: Secondary | ICD-10-CM

## 2022-05-13 LAB — BASIC METABOLIC PANEL - CANCER CENTER ONLY
Anion gap: 9 (ref 5–15)
BUN: 36 mg/dL — ABNORMAL HIGH (ref 8–23)
CO2: 24 mmol/L (ref 22–32)
Calcium: 9.5 mg/dL (ref 8.9–10.3)
Chloride: 105 mmol/L (ref 98–111)
Creatinine: 1.65 mg/dL — ABNORMAL HIGH (ref 0.61–1.24)
GFR, Estimated: 43 mL/min — ABNORMAL LOW (ref >60)
Glucose, Bld: 175 mg/dL — ABNORMAL HIGH (ref 70–99)
Potassium: 4.2 mmol/L (ref 3.5–5.1)
Sodium: 138 mmol/L (ref 135–145)

## 2022-05-13 MED ORDER — SODIUM CHLORIDE 0.9% FLUSH: 10.0000 mL | Freq: Once | INTRAVENOUS | Status: DC

## 2022-05-13 MED ORDER — HEPARIN SOD (PORK) LOCK FLUSH 100 UNIT/ML IV SOLN: 500.0000 [IU] | Freq: Once | INTRAVENOUS | Status: DC

## 2022-05-13 NOTE — Patient Instructions (Signed)
Heparin injection ?What is this medication? ?HEPARIN (HEP a rin) is an anticoagulant. It is used to treat or prevent clots in the veins, arteries, lungs, or heart. It stops clots from forming or getting bigger. This medicine prevents clotting during open-heart surgery, dialysis, or in patients who are confined to bed. ?This medicine may be used for other purposes; ask your health care provider or pharmacist if you have questions. ?COMMON BRAND NAME(S): Hep-Lock, Hep-Lock U/P, Hepflush-10, Monoject Prefill Advanced Heparin Lock Flush, SASH Normal Saline and Heparin ?What should I tell my care team before I take this medication? ?They need to know if you have any of these conditions: ?bleeding disorders, such as hemophilia or low blood platelets ?bowel disease or diverticulitis ?endocarditis ?high blood pressure ?liver disease ?recent surgery or delivery of a baby ?stomach ulcers ?an unusual or allergic reaction to heparin, benzyl alcohol, sulfites, other medicines, foods, dyes, or preservatives ?pregnant or trying to get pregnant ?breast-feeding ?How should I use this medication? ?This medicine is given by injection or infusion into a vein. It can also be given by injection of small amounts under the skin. It is usually given by a health care professional in a hospital or clinic setting. ?If you get this medicine at home, you will be taught how to prepare and give this medicine. Use exactly as directed. Take your medicine at regular intervals. Do not take it more often than directed. Do not stop taking except on your doctor's advice. Stopping this medicine may increase your risk of a blot clot. Be sure to refill your prescription before you run out of medicine. ?It is important that you put your used needles and syringes in a special sharps container. Do not put them in a trash can. If you do not have a sharps container, call your pharmacist or healthcare provider to get one. ?Talk to your pediatrician regarding the  use of this medicine in children. While this medicine may be prescribed for children for selected conditions, precautions do apply. ?Overdosage: If you think you have taken too much of this medicine contact a poison control center or emergency room at once. ?NOTE: This medicine is only for you. Do not share this medicine with others. ?What if I miss a dose? ?If you miss a dose, take it as soon as you can. If it is almost time for your next dose, take only that dose. Do not take double or extra doses. ?What may interact with this medication? ?Do not take this medicine with any of the following medications: ?aspirin and aspirin-like drugs ?mifepristone ?medicines that treat or prevent blood clots like warfarin, enoxaparin, and dalteparin ?palifermin ?protamine ?This medicine may also interact with the following medications: ?dextran ?digoxin ?hydroxychloroquine ?medicines for treating colds or allergies ?nicotine ?NSAIDs, medicines for pain and inflammation, like ibuprofen or naproxen ?phenylbutazone ?tetracycline antibiotics ?This list may not describe all possible interactions. Give your health care provider a list of all the medicines, herbs, non-prescription drugs, or dietary supplements you use. Also tell them if you smoke, drink alcohol, or use illegal drugs. Some items may interact with your medicine. ?What should I watch for while using this medication? ?Visit your healthcare professional for regular checks on your progress. You may need blood work done while you are taking this medicine. Your condition will be monitored carefully while you are receiving this medicine. It is important not to miss any appointments. ?Wear a medical ID bracelet or chain, and carry a card that describes your disease and details   of your medicine and dosage times. ?Notify your doctor or healthcare professional at once if you have cold, blue hands or feet. ?If you are going to need surgery or other procedure, tell your healthcare  professional that you are using this medicine. ?Avoid sports and activities that might cause injury while you are using this medicine. Severe falls or injuries can cause unseen bleeding. Be careful when using sharp tools or knives. Consider using an electric razor. Take special care brushing or flossing your teeth. Report any injuries, bruising, or red spots on the skin to your healthcare professional. ?Using this medicine for a long time may weaken your bones and increase the risk of bone fractures. ?You should make sure that you get enough calcium and vitamin D while you are taking this medicine. Discuss the foods you eat and the vitamins you take with your healthcare professional. ?Wear a medical ID bracelet or chain. Carry a card that describes your disease and details of your medicine and dosage times. ?What side effects may I notice from receiving this medication? ?Side effects that you should report to your doctor or health care professional as soon as possible: ?allergic reactions like skin rash, itching or hives, swelling of the face, lips, or tongue ?bone pain ?fever, chills ?nausea, vomiting ?signs and symptoms of bleeding such as bloody or black, tarry stools; red or dark-brown urine; spitting up blood or brown material that looks like coffee grounds; red spots on the skin; unusual bruising or bleeding from the eye, gums, or nose ?signs and symptoms of a blood clot such as chest pain; shortness of breath; pain, swelling, or warmth in the leg ?signs and symptoms of a stroke such as changes in vision; confusion; trouble speaking or understanding; severe headaches; sudden numbness or weakness of the face, arm or leg; trouble walking; dizziness; loss of coordination ?Side effects that usually do not require medical attention (report to your doctor or health care professional if they continue or are bothersome): ?hair loss ?pain, redness, or irritation at site where injected ?This list may not describe all  possible side effects. Call your doctor for medical advice about side effects. You may report side effects to FDA at 1-800-FDA-1088. ?Where should I keep my medication? ?Keep out of the reach of children. ?Store unopened vials at room temperature between 15 and 30 degrees C (59 and 86 degrees F). Do not freeze. Do not use if solution is discolored or particulate matter is present. Throw away any unused medicine after the expiration date. ?NOTE: This sheet is a summary. It may not cover all possible information. If you have questions about this medicine, talk to your doctor, pharmacist, or health care provider. ?? 2023 Elsevier/Gold Standard (2021-01-10 00:00:00) ? ?

## 2022-05-14 ENCOUNTER — Ambulatory Visit
Admission: RE | Admit: 2022-05-14 | Discharge: 2022-05-14 | Disposition: A | Discharge status: Home / Self Care | Payer: Medicare Other | Source: Ambulatory Visit | Attending: Surgery | Admitting: Surgery

## 2022-05-14 DIAGNOSIS — I700 Atherosclerosis of aorta: Secondary | ICD-10-CM | POA: Diagnosis not present

## 2022-05-14 DIAGNOSIS — C259 Malignant neoplasm of pancreas, unspecified: Secondary | ICD-10-CM | POA: Diagnosis not present

## 2022-05-14 DIAGNOSIS — Z95828 Presence of other vascular implants and grafts: Secondary | ICD-10-CM

## 2022-05-14 DIAGNOSIS — R935 Abnormal findings on diagnostic imaging of other abdominal regions, including retroperitoneum: Secondary | ICD-10-CM | POA: Diagnosis not present

## 2022-05-14 DIAGNOSIS — Z8719 Personal history of other diseases of the digestive system: Secondary | ICD-10-CM | POA: Diagnosis not present

## 2022-05-14 DIAGNOSIS — K859 Acute pancreatitis without necrosis or infection, unspecified: Secondary | ICD-10-CM

## 2022-05-14 IMAGING — MR MR ABDOMEN WO/W CM MRCP
18 of 20 series · 42 of 48 positions shown · IV contrast (multihance)
Comparison: CT abdomen pelvis, [DATE]

CLINICAL DATA: Pancreatic adenocarcinoma, status post distal
pancreatectomy and splenectomy, recent pancreatitis complicated by
acute pancreatic fluid collection

EXAM:
MRI ABDOMEN WITHOUT AND WITH CONTRAST (INCLUDING MRCP)
TECHNIQUE: Multiplanar multisequence MR imaging of the abdomen was performed
both before and after the administration of intravenous contrast.
Heavily T2-weighted images of the biliary and pancreatic ducts were
obtained, and three-dimensional MRCP images were rendered by post
processing.
CONTRAST:  20mL MULTIHANCE GADOBENATE DIMEGLUMINE 529 MG/ML IV SOLN

[Series 3: T2 · coronal · 5.0mm · 1.56mm/px · 1 of 41 slices shown (1 of 4)]
[im 1/41]
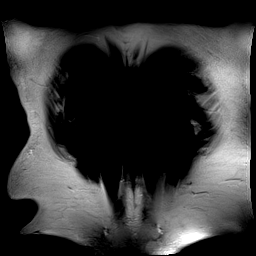

[Series 4: T1 · axial · 3.0mm · 1.19mm/px · z∈[-201,+35]mm · 4 of 160 slices shown]
[im 1/160]
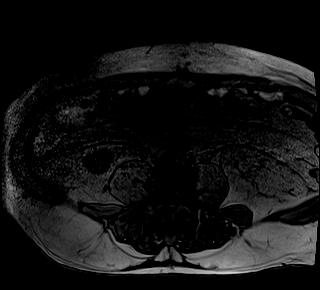
[im 54/160]
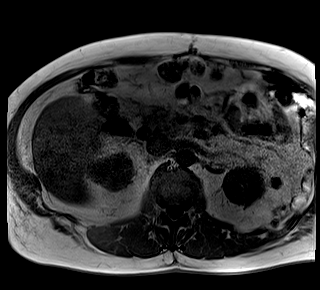
[im 107/160]
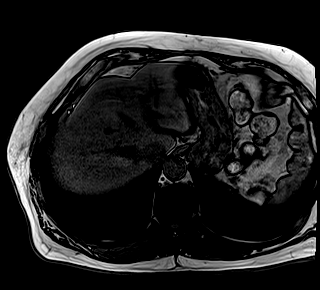
[im 160/160]
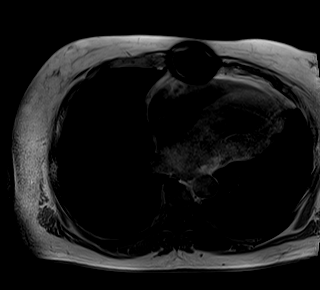

[Series 5: T2 · axial · 5.0mm · 1.56mm/px · 1 of 38 slices shown (2 of 4)]
[im 1/38]
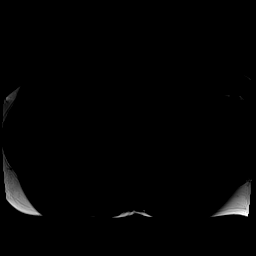

[Series 6: DWI · axial · 5.0mm · 1.42mm/px · z∈[-157,+76]mm · 3 of 120 slices shown (1 of 2)]
[im 1/120]
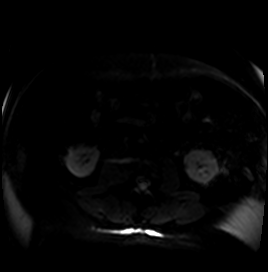
[im 60/120]
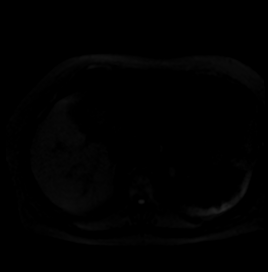
[im 120/120]
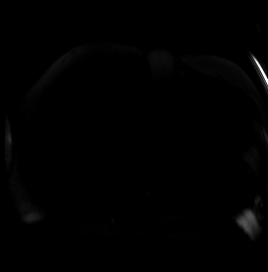

[Series 7: DWI · axial · 5.0mm · 1.42mm/px · 1 of 40 slices shown (2 of 2)]
[im 1/40]
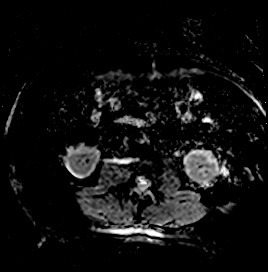

[Series 8: T2 · axial · 6.0mm · 1.25mm/px · 1 of 33 slices shown (3 of 4)]
[im 1/33]
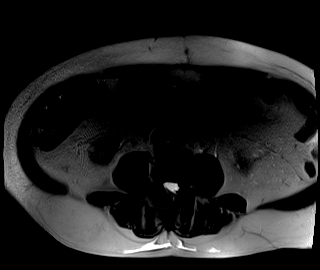

[Series 9: bSSFP · axial · 5.0mm · 1.25mm/px · 1 of 41 slices shown]
[im 1/41]
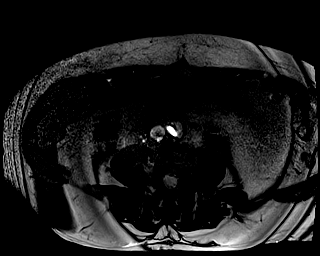

[Series 12: T2 · coronal · 3.0mm · 1.19mm/px · 1 of 21 slices shown (4 of 4)]
[im 1/21]
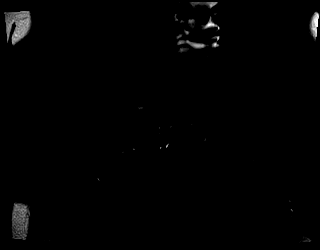

[Series 13: MRCP · coronal · 1.0mm · 0.49mm/px · 2 of 64 slices shown]
[im 1/64]
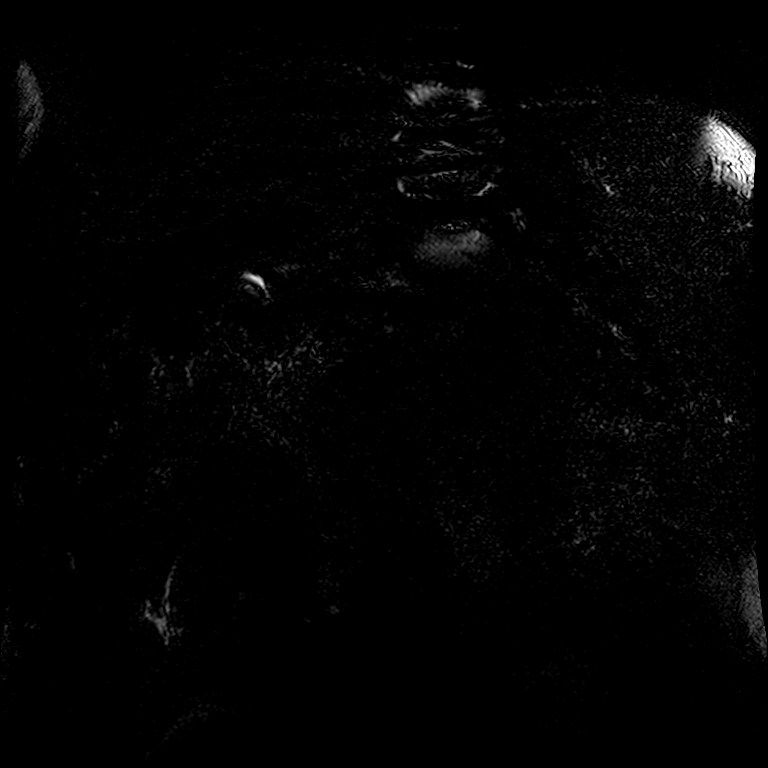
[im 64/64]
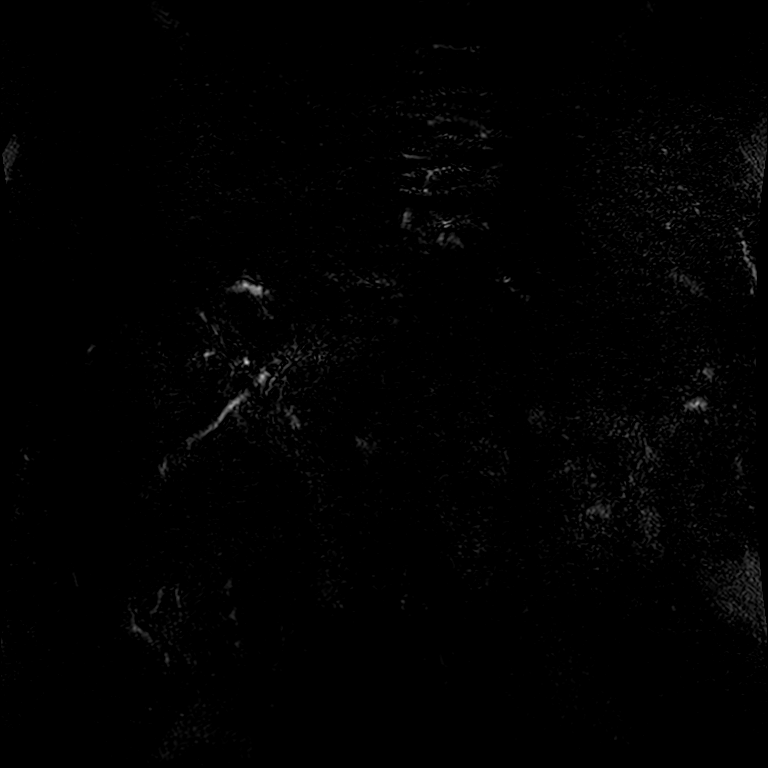

[Series 15: T1 dynamic · axial · non-contrast · 3.0mm · 1.25mm/px · z∈[-219,+41]mm · 3 of 88 slices shown (1 of 2)]
[im 1/88]
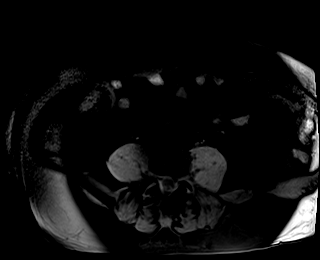
[im 44/88]
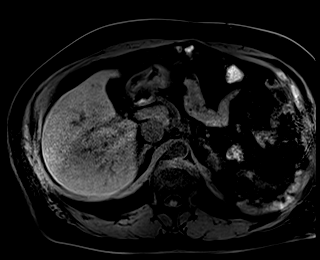
[im 88/88]
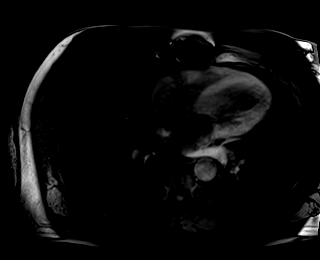

[Series 16: T1 dynamic · axial · non-contrast · 3.0mm · 1.28mm/px · z∈[-217,+42]mm · 3 of 88 slices shown (2 of 2)]
[im 1/88]
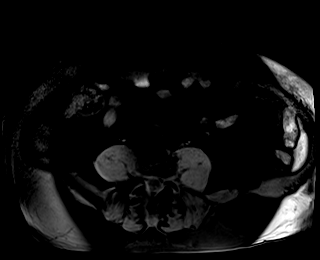
[im 44/88]
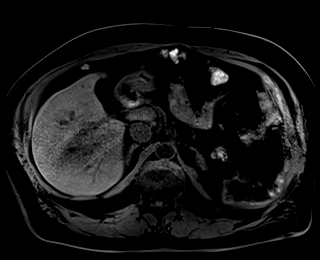
[im 88/88]
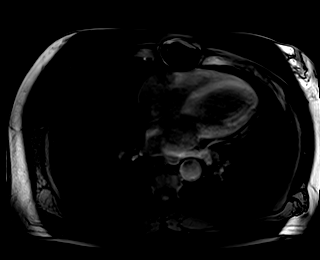

[Series 17: T1 dynamic post-contrast · axial · 3.0mm · 1.28mm/px · z∈[-217,+42]mm · 3 of 88 slices shown (1 of 7)]
[im 1/88]
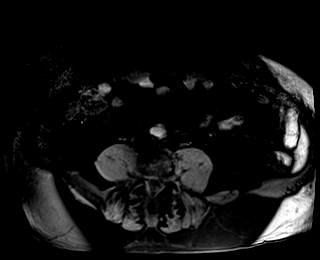
[im 44/88]
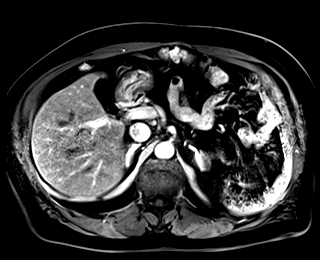
[im 88/88]
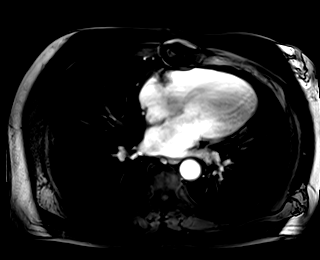

[Series 18: T1 dynamic post-contrast · axial · 3.0mm · 1.28mm/px · z∈[-217,+42]mm · 3 of 88 slices shown (2 of 7)]
[im 1/88]
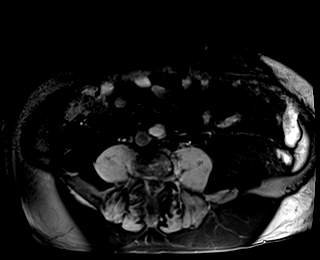
[im 44/88]
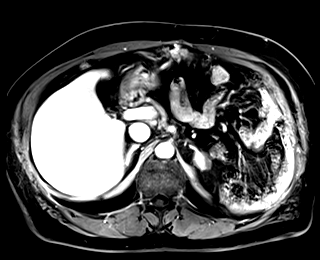
[im 88/88]
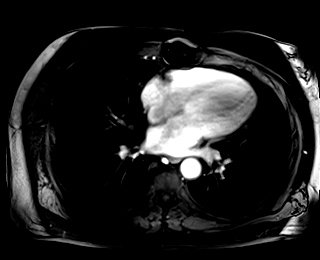

[Series 19: T1 dynamic post-contrast · axial · 3.0mm · 1.28mm/px · z∈[-217,+42]mm · 3 of 88 slices shown (3 of 7)]
[im 1/88]
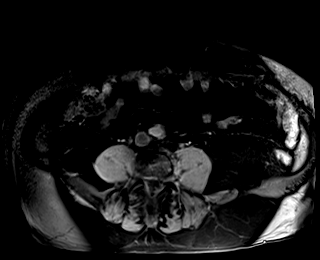
[im 44/88]
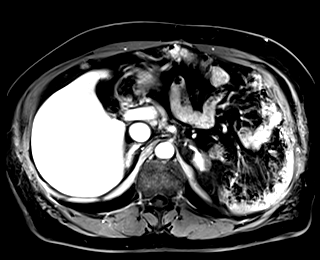
[im 88/88]
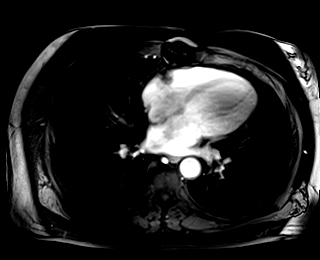

[Series 20: T1 dynamic post-contrast · coronal · 3.0mm · 1.25mm/px · 3 of 80 slices shown (4 of 7)]
[im 1/80]
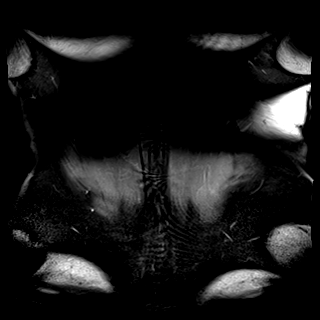
[im 40/80]
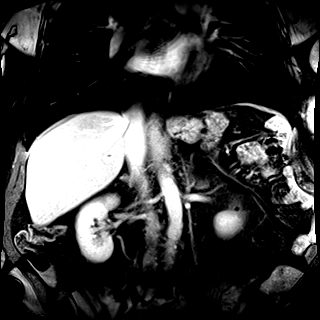
[im 80/80]
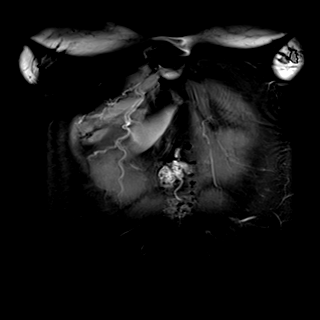

[Series 21: T1 dynamic post-contrast · axial · 3.0mm · 1.28mm/px · z∈[-217,+42]mm · 3 of 88 slices shown (5 of 7)]
[im 1/88]
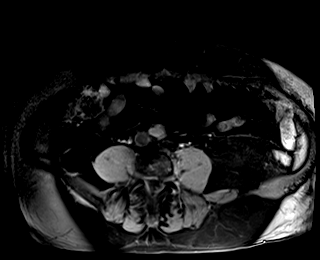
[im 44/88]
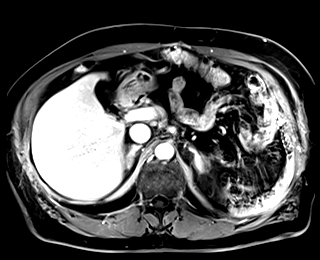
[im 88/88]
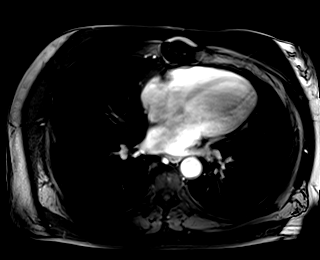

[Series 102: T1 dynamic post-contrast · axial · 3.0mm · 1.28mm/px · z∈[-217,+42]mm · 3 of 88 slices shown (6 of 7)]
[im 1/88]
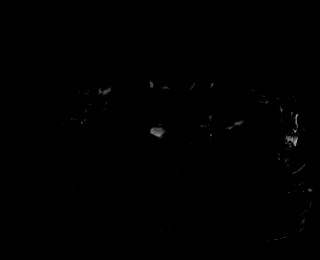
[im 44/88]
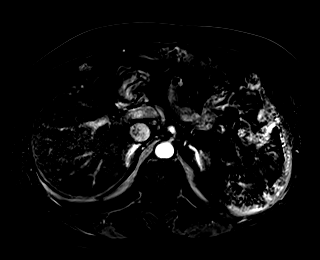
[im 88/88]
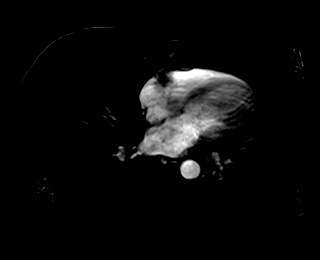

[Series 103: T1 dynamic post-contrast · axial · 3.0mm · 1.28mm/px · z∈[-217,+42]mm · 3 of 88 slices shown (7 of 7)]
[im 1/88]
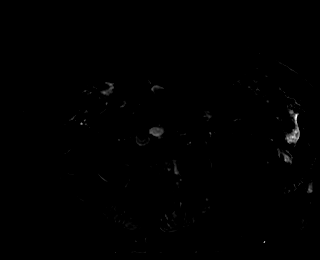
[im 44/88]
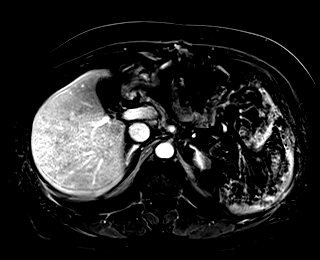
[im 88/88]
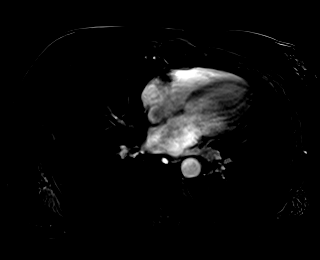

[42 of 48 positions shown; findings below may reference images not displayed]

FINDINGS: Lower chest: No acute findings.

Hepatobiliary: No mass or other parenchymal abnormality identified.
Status post cholecystectomy. No biliary ductal dilatation.

Pancreas: Status post distal pancreatectomy and splenectomy. A
previously noted acute pancreatic fluid collection within the
ventral pancreatic head is resolved (series 5, image 28). No acute
inflammatory findings.No pancreatic ductal dilatation.

Spleen:  Status post splenectomy.

Adrenals/Urinary Tract: Unchanged left adrenal nodule measuring
x 1.7 cm (series 4, image 39). No renal masses or suspicious
contrast enhancement identified. No evidence of hydronephrosis.

Stomach/Bowel: Visualized portions within the abdomen are
unremarkable.

Vascular/Lymphatic: No pathologically enlarged lymph nodes
identified. No abdominal aortic aneurysm demonstrated. Aortic
atherosclerosis.

Other:  None.

Musculoskeletal: No suspicious osseous lesions identified.
IMPRESSION: 1. Status post distal pancreatectomy and splenectomy. A previously
noted acute pancreatic fluid collection within the ventral
pancreatic head is resolved. No acute inflammatory findings. No
pancreatic ductal dilatation.
2. Unchanged left adrenal nodule measuring 2.1 x 1.7 cm. This was
characterize as a fat containing benign adenoma by prior MR, however
is not clearly fat containing on today's examination. Stability is
however reassuring for benign nature. Attention on follow-up.
3. Status post cholecystectomy.

## 2022-05-14 MED ORDER — GADOBENATE DIMEGLUMINE 529 MG/ML IV SOLN
20.0000 mL | Freq: Once | INTRAVENOUS | Status: AC | PRN
  Administered 2022-05-14: 20 mL via INTRAVENOUS

## 2022-05-14 MED ORDER — SODIUM CHLORIDE 0.9% FLUSH
10.0000 mL | INTRAVENOUS | Status: DC | PRN
  Administered 2022-05-14: 10 mL via INTRAVENOUS

## 2022-05-14 MED ORDER — HEPARIN SOD (PORK) LOCK FLUSH 100 UNIT/ML IV SOLN
500.0000 [IU] | Freq: Once | INTRAVENOUS | Status: AC
  Administered 2022-05-14: 500 [IU] via INTRAVENOUS

## 2022-06-24 ENCOUNTER — Inpatient Hospital Stay: Payer: Medicare Other

## 2022-06-24 ENCOUNTER — Inpatient Hospital Stay: Payer: Medicare Other | Attending: Nurse Practitioner | Admitting: Oncology

## 2022-06-24 ENCOUNTER — Encounter: Payer: Self-pay | Admitting: *Deleted

## 2022-06-24 DIAGNOSIS — M25561 Pain in right knee: Secondary | ICD-10-CM | POA: Insufficient documentation

## 2022-06-24 DIAGNOSIS — I10 Essential (primary) hypertension: Secondary | ICD-10-CM | POA: Insufficient documentation

## 2022-06-24 DIAGNOSIS — C251 Malignant neoplasm of body of pancreas: Secondary | ICD-10-CM | POA: Insufficient documentation

## 2022-06-24 DIAGNOSIS — E119 Type 2 diabetes mellitus without complications: Secondary | ICD-10-CM | POA: Insufficient documentation

## 2022-06-24 DIAGNOSIS — I2510 Atherosclerotic heart disease of native coronary artery without angina pectoris: Secondary | ICD-10-CM | POA: Insufficient documentation

## 2022-06-24 NOTE — Progress Notes (Signed)
"  No show" for flush/OV today. Scheduling message sent to reschedule

## 2022-06-25 ENCOUNTER — Telehealth: Payer: Self-pay | Admitting: Oncology

## 2022-06-25 NOTE — Telephone Encounter (Signed)
Attempted to contact patient and wife in regards to reschedule missed appointments 7/11. No answer so voicemail was left for patient or wife to call back

## 2022-07-02 ENCOUNTER — Ambulatory Visit (INDEPENDENT_AMBULATORY_CARE_PROVIDER_SITE_OTHER): Payer: Medicare Other | Admitting: Orthopaedic Surgery

## 2022-07-02 DIAGNOSIS — E1122 Type 2 diabetes mellitus with diabetic chronic kidney disease: Secondary | ICD-10-CM

## 2022-07-02 DIAGNOSIS — N183 Chronic kidney disease, stage 3 unspecified: Secondary | ICD-10-CM

## 2022-07-02 DIAGNOSIS — G629 Polyneuropathy, unspecified: Secondary | ICD-10-CM

## 2022-07-02 DIAGNOSIS — Z794 Long term (current) use of insulin: Secondary | ICD-10-CM

## 2022-07-02 DIAGNOSIS — L84 Corns and callosities: Secondary | ICD-10-CM

## 2022-07-02 NOTE — Progress Notes (Addendum)
Office Visit Note   Patient: [name]           Date of Birth: [birth year]           MRN: [id number] Visit Date: 07/02/2022              Requested by: [name], [name] G, MD [address] [place],  [place] [address] PCP: [name], [name] G, MD   Assessment & Plan: Visit Diagnoses:  1. Peripheral polyneuropathy   2. Type 2 diabetes mellitus with stage 3 chronic kidney disease, with long-term current use of insulin, unspecified whether stage 3a or 3b CKD ([place])   3. Foot callus     Plan: Debrided right foot single corn/callus rating down to soft tissue.  Foot care callus care again discussed. Sterile 10 blade was used for debridement.  Small trace area of bleeding clean Band-Aid applied.  Wife has been continuing to file this down and needs to file it more to keep it softer.  They will return if they have ongoing problems. Follow-Up Instructions: No follow-ups on file.   Orders:  No orders of the defined types were placed in this encounter.  No orders of the defined types were placed in this encounter.     Procedures: No procedures performed   Clinical Data: No additional findings.   Subjective: Chief Complaint  Patient presents with   Right Foot - Wound Check    Wound Check   patient returns working recurrent problems right foot callus.  His wife's been filing it daily but he has a central area with blood blister all bleeding.  No cellulitis.  Review of Systems positive for stage III kidney disease ,diabetes, pancreatic cancer.   Objective: Vital Signs: There were no vitals taken for this visit.  Physical Exam Constitutional:      Appearance: He is well-developed.  HENT:     Head: Normocephalic and atraumatic.     Right Ear: External ear normal.     Left Ear: External ear normal.  Eyes:     Pupils: Pupils are equal, round, and reactive to light.  Neck:     Thyroid: No thyromegaly.     Trachea: No tracheal deviation.  Cardiovascular:      Rate and Rhythm: Normal rate.  Pulmonary:     Effort: Pulmonary effort is normal.     Breath sounds: No wheezing.  Abdominal:     General: Bowel sounds are normal.     Palpations: Abdomen is soft.  Musculoskeletal:     Cervical back: Neck supple.  Skin:    General: Skin is warm and dry.     Capillary Refill: Capillary refill takes less than 2 seconds.  Neurological:     Mental Status: He is alert and oriented to person, place, and time.  Psychiatric:        Behavior: Behavior normal.        Thought Content: Thought content normal.        Judgment: Judgment normal.     Ortho Exam scar formation over the fifth metatarsal head plantar surface with central old blood blister.  Specialty Comments:  No specialty comments available.  Imaging: No results found.   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/08/2022   Atherosclerosis of aorta ([place]) 02/21/2022   Acute-on-chronic kidney injury ([place]) 02/07/2022   OSA (obstructive sleep apnea) 02/07/2022   History of pancreatic  cancer 02/07/2022   Pancreatitis 02/07/2022   Acute pancreatitis 02/06/2022   Foot callus 10/21/2021   Cellulitis 09/22/2021   Gastritis, 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   Pancreatic cancer ([place]) 07/01/2021   Pancreatic adenocarcinoma ([place]) 07/01/2021   Coronary artery disease of bypass graft of native heart with stable angina pectoris ([place]) 06/04/2021   Genetic testing 04/04/2021   History of prostate cancer    Family history of breast cancer    Port-A-Cath in place 03/14/2021   Atrial fibrillation ([place])    Syncope and collapse 03/04/2021   Primary cancer of body of pancreas ([place]) 01/31/2021   Chest pain of uncertain etiology 70/35/0093   Elevated lipase 10/17/2020   H/O agent Orange exposure 10/17/2020   Dyslipidemia, goal LDL below 70 10/17/2020   Hx of CABG 02/28/2020   DM (diabetes mellitus), type 2 with renal  complications ([place]) 81/82/9937   Hypertension with heart disease 04/01/2019   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   CKD (chronic kidney disease), stage III ([place]) 04/01/2019   Peripheral neuropathy 04/01/2019   Insomnia 11/30/2018   Past Medical History:  Diagnosis Date   A-fib ([place]) 03/04/2021   Anemia    Arthritis    Cancer (H[facility])    prostate   Chronic kidney disease    blood in urine    Constipation    Coronary artery disease    Depression    Diabetes mellitus without complication ([place])    Difficult intubation    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 it was hard to get the tube down his throat   Dyspnea    Family history of breast cancer    Fatty liver    Fatty liver    Frequent headaches    GERD (gastroesophageal reflux disease)    History of chicken pox    History of fainting spells of unknown cause    History of prostate cancer    Hyperlipidemia    Hypertension    Myocardial infarction ([place])    Peripheral neuropathy    Pneumonia    Prostate cancer (HCC)    PTSD (post-traumatic stress disorder)    Sleep apnea    uses Cpap    Family History  Problem Relation Age of Onset   Arthritis Mother    Heart disease Mother    Hyperlipidemia Mother    Hypertension Mother    Heart attack Mother    Breast cancer Sister        dx 36s   Heart attack Brother    Heart disease Brother    Depression Daughter    Arthritis Sister    Cancer Sister        unknown type, dx 25s   Cancer Brother    Learning disabilities Brother    Alcohol abuse Brother    COPD Brother    Heart attack Brother    Heart disease Brother     Past Surgical History:  Procedure Laterality Date   APPENDECTOMY     BIOPSY  01/15/2021   Procedure: BIOPSY;  Surgeon: Mansouraty, Telford Nab., MD;  [facility] END[facility]COPY;  Service: Gastroenterology;;   CARDIAC CATHETERIZATION  06/26/2017   CARDIAC SURGERY     Triple Bypass   CHOLECYSTECTOMY  2010   COLONOSCOPY     CORONARY ARTERY BYPASS GRAFT  2004    ESOPHAGOGASTRODUODENOSCOPY (EGD) WITH PROPOFOL N/A 01/15/2021   Procedure: ESOPHAGOGASTRODUODENOSCOPY (EGD) WITH PROPOFOL;  Surgeon: Irving Copas., MD;  Location[facility] ENDOSCOPY;  Service: Gastroenterology;  Laterality: N/A;   EUS  N/A 01/15/2021   Procedure: UPPER ENDOSCOPIC ULTRASOUND (EUS) RADIAL;  Surgeon: Irving Copas., MD;  Location: [place];  Service: Gastroenterology;  Laterality: N/A;   EYE SURGERY Bilateral    cataract removal   FINE NEEDLE ASPIRATION  01/15/2021   Procedure: FINE NEEDLE ASPIRATION (FNA) LINEAR;  Surgeon: Irving Copas., MD;  Location: [place];  Service: Gastroenterology;;   LAPAROSCOPY N/A 07/01/2021   Procedure: STAGING LAPAROSCOPY;  Surgeon: Dwan Bolt, MD;  Location: [place];  Service: General;  Laterality: N/A;   PORTACATH PLACEMENT Right 02/21/2021   Procedure: INSERTION PORT-A-CATH;  Surgeon: Dwan Bolt, MD;  Location: [place];  Service: General;  Laterality: Right;   SPLENECTOMY, 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   Procedure: SPLENECTOMY;  Surgeon: Dwan Bolt, MD;  Location: [place];  Service: General;  Laterality: N/A;   TONSILLECTOMY  1958   Social History   Occupational History   Occupation: retired  Tobacco Use   Smoking status: Former    Types: Cigarettes    Quit date: 12/14/1975    Years since quitting: 46.5   Smokeless tobacco: Never  Vaping Use   Vaping Use: Not on file  Substance and Sexual Activity   Alcohol use: Not Currently   Drug use: Never   Sexual activity: Not Currently

## 2022-07-04 NOTE — Progress Notes (Unsigned)
HPI: [name] is a 76 y.o. male, who is here today with his wife for chronic disease management.  Last seen on 04/08/22 Since his last visit he has seen his orthopedist, 07/02/2022; and his surgeon. Diabetes Mellitus II:  - Checking BG at home: *** - Medications: Lantus 45 units daily, Humalog 5 units before lunch, 3-5 units before dinner - Diet: No significant changes. - Exercise: Not regularly, unstable gait and hx of chronic pain. - eye exam: due - foot exam: 01/06/22  - Negative for symptoms of hypoglycemia, polyuria, polydipsia, numbness extremities, foot ulcers/trauma  Lab Results  Component Value Date   HGBA1C 9.9 (A) 04/08/2022   Lab Results  Component Value Date   MICROALBUR 7.8 10/10/2020   Hypertension:  Medications: Amlodipine 5 mg daily and Valsartan 160 mg daily 1/2 tab daily. BP readings at home:Most 120-130's/70's. Side effects: None Negative for unusual or severe headache, visual changes, exertional chest pain, dyspnea,  new focal weakness, or worsening edema.  Lab Results  Component Value Date   CREATININE 1.65 (H) 05/13/2022   BUN 36 (H) 05/13/2022   NA 138 05/13/2022   K 4.2 05/13/2022   CL 105 05/13/2022   CO2 24 05/13/2022   He would like to see ortho for left knee pain. He has seen Dr [name] for *** PAD, ABI in 09/2021 ***   Review of Systems  Constitutional:  Negative for activity change, appetite change and fever.  HENT:  Negative for nosebleeds, sore throat and trouble swallowing.   Respiratory:  Negative for cough and wheezing.   Cardiovascular:  Negative for palpitations.  Gastrointestinal:  Negative for abdominal pain, nausea and vomiting.  Genitourinary:  Negative for decreased urine volume, dysuria and hematuria.  Musculoskeletal:  Positive for arthralgias and gait problem.  Neurological:  Negative for dizziness, seizures, weakness, numbness and headaches.  Psychiatric/Behavioral: Negative.     Rest of ROS see  pertinent positives and negatives in HPI.  Current Outpatient Medications on File Prior to Visit  Medication Sig Dispense Refill   acetaminophen (TYLENOL) 500 MG tablet Take 1,000 mg by mouth 2 (two) times daily.     amLODipine (NORVASC) 5 MG tablet Take 1 tablet (5 mg total) by mouth daily. 90 tablet 3   apixaban (ELIQUIS) 5 MG TABS tablet Take 5 mg by mouth 2 (two) times daily.     atorvastatin (LIPITOR) 80 MG tablet Take 40 mg by mouth at bedtime.     calcium-vitamin D (OSCAL WITH D) 500-200 MG-UNIT tablet Take 1 tablet by mouth 2 (two) times daily.     clotrimazole-betamethasone (LOTRISONE) cream Apply 1 application topically daily as needed. 45 g 2   Continuous Blood Gluc Sensor (DEXCOM G6 SENSOR) MISC Change sensor q 10 days. 3 each 1   Continuous Blood Gluc Transmit (DEXCOM G6 TRANSMITTER) MISC 1 Device by Does not apply route every 3 (three) months. 1 each 3   cycloSPORINE (RESTASIS) 0.05 % ophthalmic emulsion Place 1 drop into both eyes 2 (two) times daily as needed (dry eyes).     ferrous sulfate 325 (65 FE) MG tablet Take 1 tablet (325 mg total) by mouth daily with breakfast. 90 tablet 2   FLUoxetine (PROZAC) 20 MG capsule Take 40 mg by mouth daily.     fluticasone (FLONASE) 50 MCG/ACT nasal spray Place 1 spray into both nostrils daily.     glucose blood (ONETOUCH VERIO) test strip Use to test blood sugars 1-2 times daily. 200 each 12  hydrALAZINE (APRESOLINE) 25 MG tablet Take 1 tablet (25 mg total) by mouth 3 (three) times daily. 270 tablet 1   insulin glargine (LANTUS) 100 UNIT/ML injection Inject 0.45 mLs (45 Units total) into the skin daily. 10 mL 3   lidocaine-prilocaine (EMLA) cream Apply 1 application. topically as needed.     loratadine (CLARITIN) 10 MG tablet Take 10 mg by mouth daily.     Multiple Vitamin (MULTI-VITAMINS) TABS Take 1 tablet by mouth daily.     nitroGLYCERIN (NITROSTAT) 0.3 MG SL tablet Place 0.3 mg under the tongue every 5 (five) minutes as needed for  chest pain.     nystatin powder Apply 1 application topically 3 (three) times daily. 15 g 2   pantoprazole (PROTONIX) 40 MG tablet Take 1 tablet (40 mg total) by mouth daily. 90 tablet 3   tamsulosin (FLOMAX) 0.4 MG CAPS capsule Take 0.8 mg by mouth at bedtime.     traZODone (DESYREL) 100 MG tablet Take 100 mg by mouth at bedtime.     vitamin B-12 (CYANOCOBALAMIN) 1000 MCG tablet Take 1,000 mcg by mouth daily.     Wheat Dextrin (BENEFIBER DRINK MIX PO) Take by mouth daily.     furosemide (LASIX) 20 MG tablet Take 1 tablet (20 mg total) by mouth daily. 90 tablet 3   No current facility-administered medications on file prior to visit.    Past Medical History:  Diagnosis Date   A-fib ([place]) 03/04/2021   Anemia    Arthritis    Cancer (HCC)    prostate   Chronic kidney disease    blood in urine    Constipation    Coronary artery disease    Depression    Diabetes mellitus without complication ([place])    Difficult intubation    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 it was hard to get the tube down his throat   Dyspnea    Family history of breast cancer    Fatty liver    Fatty liver    Frequent headaches    GERD (gastroesophageal reflux disease)    History of chicken pox    History of fainting spells of unknown cause    History of prostate cancer    Hyperlipidemia    Hypertension    Myocardial infarction ([place])    Peripheral neuropathy    Pneumonia    Prostate cancer (HCC)    PTSD (post-traumatic stress disorder)    Sleep apnea    uses Cpap   Allergies  Allergen Reactions   Keflex [Cephalexin] Other (See Comments)    Hallucinations?   Lisinopril Other (See Comments)    Unknown   Terazosin Other (See Comments)    Unknown    Social History   Socioeconomic History   Marital status: Married    Spouse name: Not on file   Number of children: 2   Years of education: Not on file   Highest education level: Not on file  Occupational History   Occupation: retired  Tobacco Use   Smoking  status: Former    Types: Cigarettes    Quit date: 12/14/1975    Years since quitting: 46.5   Smokeless tobacco: Never  Vaping Use   Vaping Use: Not on file  Substance and Sexual Activity   Alcohol use: Not Currently   Drug use: Never   Sexual activity: Not Currently  Other Topics Concern   Not on file  Social History Narrative   ** Merged History Encounter **  Social Determinants of Health   Financial Resource Strain: Low Risk  (12/06/2021)   Overall Financial Resource Strain (CARDIA)    Difficulty of Paying Living Expenses: Not hard at all  Food Insecurity: No Food Insecurity (12/06/2021)   Hunger Vital Sign    Worried About Running Out of Food in the Last Year: Never true    Ran Out of Food in the Last Year: Never true  Transportation Needs: No Transportation Needs (12/06/2021)   PRAPARE - Hydrologist (Medical): No    Lack of Transportation (Non-Medical): No  Physical Activity: Insufficiently Active (12/06/2021)   Exercise Vital Sign    Days of Exercise per Week: 2 days    Minutes of Exercise per Session: 60 min  Stress: No Stress Concern Present (12/06/2021)   [place]    Feeling of Stress : Not at all  Social Connections: Moderately Isolated (12/06/2021)   Social Connection and Isolation Panel [NHANES]    Frequency of Communication with Friends and Family: More than three times a week    Frequency of Social Gatherings with Friends and Family: More than three times a week    Attends Religious Services: Never    Marine scientist or Organizations: No    Attends Archivist Meetings: Never    Marital Status: Married    Vitals:   07/07/22 1014  BP: 118/70  Pulse: 90  Resp: 16  Temp: 98.3 F (36.8 C)  SpO2: 97%   Body mass index is 35.15 kg/m.  Physical Exam Nursing note reviewed.  Constitutional:      General: He is not in acute distress.     Appearance: He is well-developed.  HENT:     Head: Normocephalic and atraumatic.     Mouth/Throat:     Mouth: Mucous membranes are moist.  Eyes:     Conjunctiva/sclera: Conjunctivae normal.  Cardiovascular:     Rate and Rhythm: Normal rate and regular rhythm.     Heart sounds: No murmur heard.    Comments: DP pulses present. Pulmonary:     Effort: Pulmonary effort is normal. No respiratory distress.     Breath sounds: Normal breath sounds.  Abdominal:     Palpations: Abdomen is soft. There is no mass.     Tenderness: There is no abdominal tenderness.  Musculoskeletal:     Left knee: No erythema. Normal range of motion.     Right lower leg: 1+ Pitting Edema present.     Left lower leg: 1+ Pitting Edema present.  Skin:    General: Skin is warm.     Findings: No erythema or rash.  Neurological:     Mental Status: He is alert and oriented to person, place, and time.     Cranial Nerves: No cranial nerve deficit.     Comments: Unstable gait, assisted by a cane.  Psychiatric:     Comments: Well groomed, good eye contact.     ASSESSMENT AND PLAN:  [name] was seen today for follow-up.  Diagnoses and all orders for this visit:  Type 2 diabetes mellitus with stage 3 chronic kidney disease, with long-term current use of insulin, unspecified whether stage 3a or 3b CKD (HCC) -     POC HgB A1c -     insulin lispro (HUMALOG) 100 UNIT/ML injection; Per sliding scale.  Hypertension with heart disease  Chronic pain of left knee  PAD (peripheral artery disease) ([place])  Essential hypertension -     valsartan (DIOVAN) 160 MG tablet; Take 0.5 tablets (80 mg total) by mouth daily.  Peripheral polyneuropathy  Stage 3a chronic kidney disease ([place])    Orders Placed This Encounter  Procedures   POC HgB A1c    Peripheral neuropathy Most likely contributing to lower extremity pain. Continue adequate foot care and trauma prevention.  CKD (chronic kidney disease), stage III  ([place]) Since 02/2022 Cr has been 1.5-1.6 and e GFR in the low 40's. Continue adequate hydration, BP and glucose control, low-salt diet, and avoidance of NSAIDs. He is having blood work at his oncologist office, usually he has CMP and CBC.  DM (diabetes mellitus), type 2 with renal complications ([place]) MIW8E has improved and now it is at goal.  Hemoglobin A1c went from 9.9 to 6.8. We discussed side effects of medications. He has had some episodes of hypoglycemia, usually in the morning before breakfast which could be caused by taking Humalog at bedtime. Explained that Humalog is to be given before meals, we will change to sliding scale. For now no changes in Lantus but if in 2 weeks she still has frequent BS under 100, he was instructed to decrease dose from 45 units to 42 U. Annual eye exam, periodic dental and foot care recommended. F/U in 4-5 months.  I spent a total of 45 minutes in both face to face and non face to face activities for this visit on the date of this encounter. During this time history was obtained and documented, examination was performed, prior labs/imaging reviewed***, and assessment/plan discussed.  Return in about 5 months (around 12/07/2022).  Corinthia Helmers G. [name], MD  [facility]. [place] office.

## 2022-07-07 ENCOUNTER — Encounter: Payer: Self-pay | Admitting: Family Medicine

## 2022-07-07 ENCOUNTER — Ambulatory Visit (INDEPENDENT_AMBULATORY_CARE_PROVIDER_SITE_OTHER): Payer: Medicare Other | Admitting: Family Medicine

## 2022-07-07 ENCOUNTER — Encounter: Payer: Self-pay | Admitting: Oncology

## 2022-07-07 ENCOUNTER — Other Ambulatory Visit: Payer: Self-pay

## 2022-07-07 VITALS — BP 118/70 | HR 90 | Temp 98.3°F | Resp 16 | Ht 71.0 in | Wt 252.0 lb

## 2022-07-07 DIAGNOSIS — M25562 Pain in left knee: Secondary | ICD-10-CM | POA: Diagnosis not present

## 2022-07-07 DIAGNOSIS — G8929 Other chronic pain: Secondary | ICD-10-CM | POA: Diagnosis not present

## 2022-07-07 DIAGNOSIS — Z794 Long term (current) use of insulin: Secondary | ICD-10-CM | POA: Diagnosis not present

## 2022-07-07 DIAGNOSIS — E1122 Type 2 diabetes mellitus with diabetic chronic kidney disease: Secondary | ICD-10-CM | POA: Diagnosis not present

## 2022-07-07 DIAGNOSIS — N183 Chronic kidney disease, stage 3 unspecified: Secondary | ICD-10-CM

## 2022-07-07 DIAGNOSIS — I739 Peripheral vascular disease, unspecified: Secondary | ICD-10-CM | POA: Diagnosis not present

## 2022-07-07 DIAGNOSIS — I2510 Atherosclerotic heart disease of native coronary artery without angina pectoris: Secondary | ICD-10-CM

## 2022-07-07 DIAGNOSIS — I119 Hypertensive heart disease without heart failure: Secondary | ICD-10-CM

## 2022-07-07 DIAGNOSIS — G629 Polyneuropathy, unspecified: Secondary | ICD-10-CM

## 2022-07-07 DIAGNOSIS — N1831 Chronic kidney disease, stage 3a: Secondary | ICD-10-CM | POA: Diagnosis not present

## 2022-07-07 DIAGNOSIS — I10 Essential (primary) hypertension: Secondary | ICD-10-CM | POA: Diagnosis not present

## 2022-07-07 DIAGNOSIS — E785 Hyperlipidemia, unspecified: Secondary | ICD-10-CM

## 2022-07-07 LAB — POCT GLYCOSYLATED HEMOGLOBIN (HGB A1C): HbA1c, POC (controlled diabetic range): 6.8 % (ref 0.0–7.0)

## 2022-07-07 MED ORDER — INSULIN LISPRO 100 UNIT/ML IJ SOLN: INTRAMUSCULAR | 11 refills | Status: DC

## 2022-07-07 MED ORDER — VALSARTAN 160 MG PO TABS: 80.0000 mg | ORAL_TABLET | Freq: Every day | ORAL | 3 refills | Status: DC

## 2022-07-07 NOTE — Assessment & Plan Note (Signed)
Most likely contributing to lower extremity pain. Continue adequate foot care and trauma prevention.

## 2022-07-07 NOTE — Assessment & Plan Note (Signed)
BP adequately controlled. Continue hydralazine 25 mg 3 times daily, amlodipine 5 mg daily, and valsartan 160 mg 1/2 tablet daily. Continue monitoring BP regularly. Low-salt diet to continue.

## 2022-07-07 NOTE — Assessment & Plan Note (Signed)
Continue Lipitor 80 mg daily. On chronic anticoagulation, he is on Eliquis 5 mg twice daily.

## 2022-07-07 NOTE — Assessment & Plan Note (Signed)
HgA1C has improved and now it is at goal.  Hemoglobin A1c went from 9.9 to 6.8. We discussed side effects of medications. He has had some episodes of hypoglycemia, usually in the morning before breakfast which could be caused by taking Humalog at bedtime. Explained that Humalog is to be given before meals, we will change to sliding scale. For now no changes in Lantus but if in 2 weeks she still has frequent BS under 100, he was instructed to decrease dose from 45 units to 42 U. Annual eye exam, periodic dental and foot care recommended. F/U in 4-5 months.

## 2022-07-07 NOTE — Patient Instructions (Addendum)
A few things to remember from today's visit:  Type 2 diabetes mellitus with stage 3 chronic kidney disease, with long-term current use of insulin, unspecified whether stage 3a or 3b CKD ([place]) - Plan: POC HgB A1c, insulin lispro (HUMALOG) 100 UNIT/ML injection  Hypertension with heart disease  Chronic pain of left knee  PAD (peripheral artery disease) ([place])  If you need refills please call your pharmacy. Do not use My Chart to request refills or for acute issues that need immediate attention.    Please be sure medication list is accurate. If a new problem present, please set up appointment sooner than planned today.  We will continue Humalog 2 times per day BEFORE lunch and supper according to blood sugar as follows:  Diabetic Sliding Scale (I)  IF BLOOD SUGAR IS:  LESS THAN 100 ( NO INSULIN)  100-140 (2 UNITS)  141-180 (4 UNITS)  181-220 (6 UNITS)  221-260 (8 UNITS)  261-300 (10 UNITS)  301-340 (12 UNITS)  MORE THAN 341 UNITS (14 UNITS)  No changes in Lantus but in 2 weeks if still having blood sugars under 100 frequently, you can decrease it from 45 to 42 Units. No changes in rest. It seems like you are having labs in a few days, so no labs today.  I think you can see Dr Lorin Mercy for your knee pain.

## 2022-07-07 NOTE — Assessment & Plan Note (Signed)
Since 02/2022 Cr has been 1.5-1.6 and e GFR in the low 40's. Continue adequate hydration, BP and glucose control, low-salt diet, and avoidance of NSAIDs. He is having blood work at his oncologist office, usually he has CMP and CBC.

## 2022-07-08 ENCOUNTER — Other Ambulatory Visit: Payer: Self-pay

## 2022-07-08 ENCOUNTER — Ambulatory Visit: Payer: Medicare Other | Admitting: Family Medicine

## 2022-07-11 ENCOUNTER — Inpatient Hospital Stay (HOSPITAL_BASED_OUTPATIENT_CLINIC_OR_DEPARTMENT_OTHER): Payer: Medicare Other | Admitting: Oncology

## 2022-07-11 ENCOUNTER — Inpatient Hospital Stay: Payer: Medicare Other

## 2022-07-11 ENCOUNTER — Other Ambulatory Visit: Payer: Self-pay

## 2022-07-11 VITALS — BP 147/65 | HR 79 | Temp 98.1°F | Resp 20 | Ht 71.0 in | Wt 254.8 lb

## 2022-07-11 DIAGNOSIS — Z9081 Acquired absence of spleen: Secondary | ICD-10-CM

## 2022-07-11 DIAGNOSIS — C251 Malignant neoplasm of body of pancreas: Secondary | ICD-10-CM | POA: Diagnosis not present

## 2022-07-11 DIAGNOSIS — M25561 Pain in right knee: Secondary | ICD-10-CM | POA: Diagnosis not present

## 2022-07-11 DIAGNOSIS — N1831 Chronic kidney disease, stage 3a: Secondary | ICD-10-CM

## 2022-07-11 DIAGNOSIS — I2510 Atherosclerotic heart disease of native coronary artery without angina pectoris: Secondary | ICD-10-CM | POA: Diagnosis not present

## 2022-07-11 DIAGNOSIS — Z95828 Presence of other vascular implants and grafts: Secondary | ICD-10-CM

## 2022-07-11 DIAGNOSIS — E119 Type 2 diabetes mellitus without complications: Secondary | ICD-10-CM | POA: Diagnosis not present

## 2022-07-11 DIAGNOSIS — I10 Essential (primary) hypertension: Secondary | ICD-10-CM | POA: Diagnosis not present

## 2022-07-11 DIAGNOSIS — K2960 Other gastritis without bleeding: Secondary | ICD-10-CM

## 2022-07-11 LAB — CMP (CANCER CENTER ONLY)
ALT: 12 U/L (ref 0–44)
AST: 21 U/L (ref 15–41)
Albumin: 3.8 g/dL (ref 3.5–5.0)
Alkaline Phosphatase: 107 U/L (ref 38–126)
Anion gap: 9 (ref 5–15)
BUN: 29 mg/dL — ABNORMAL HIGH (ref 8–23)
CO2: 24 mmol/L (ref 22–32)
Calcium: 9 mg/dL (ref 8.9–10.3)
Chloride: 101 mmol/L (ref 98–111)
Creatinine: 1.45 mg/dL — ABNORMAL HIGH (ref 0.61–1.24)
GFR, Estimated: 50 mL/min — ABNORMAL LOW (ref >60)
Glucose, Bld: 235 mg/dL — ABNORMAL HIGH (ref 70–99)
Potassium: 4.2 mmol/L (ref 3.5–5.1)
Sodium: 134 mmol/L — ABNORMAL LOW (ref 135–145)
Total Bilirubin: 0.5 mg/dL (ref 0.3–1.2)
Total Protein: 6.9 g/dL (ref 6.5–8.1)

## 2022-07-11 LAB — CBC WITH DIFFERENTIAL (CANCER CENTER ONLY)
Abs Immature Granulocytes: 0.01 10*3/uL (ref 0.00–0.07)
Basophils Absolute: 0 10*3/uL (ref 0.0–0.1)
Basophils Relative: 0 %
Eosinophils Absolute: 0.1 10*3/uL (ref 0.0–0.5)
Eosinophils Relative: 2 %
HCT: 34.5 % — ABNORMAL LOW (ref 39.0–52.0)
Hemoglobin: 11.7 g/dL — ABNORMAL LOW (ref 13.0–17.0)
Immature Granulocytes: 0 %
Lymphocytes Relative: 23 %
Lymphs Abs: 1.6 10*3/uL (ref 0.7–4.0)
MCH: 32.6 pg (ref 26.0–34.0)
MCHC: 33.9 g/dL (ref 30.0–36.0)
MCV: 96.1 fL (ref 80.0–100.0)
Monocytes Absolute: 0.7 10*3/uL (ref 0.1–1.0)
Monocytes Relative: 10 %
Neutro Abs: 4.7 10*3/uL (ref 1.7–7.7)
Neutrophils Relative %: 65 %
Platelet Count: 239 10*3/uL (ref 150–400)
RBC: 3.59 MIL/uL — ABNORMAL LOW (ref 4.22–5.81)
RDW: 13.2 % (ref 11.5–15.5)
WBC Count: 7.2 10*3/uL (ref 4.0–10.5)
nRBC: 0 % (ref 0.0–0.2)

## 2022-07-11 MED ORDER — SODIUM CHLORIDE 0.9% FLUSH
10.0000 mL | Freq: Once | INTRAVENOUS | Status: AC
  Administered 2022-07-11: 10 mL

## 2022-07-11 MED ORDER — HEPARIN SOD (PORK) LOCK FLUSH 100 UNIT/ML IV SOLN
500.0000 [IU] | Freq: Once | INTRAVENOUS | Status: AC
  Administered 2022-07-11: 500 [IU]

## 2022-07-11 NOTE — Progress Notes (Signed)
[facility] OFFICE PROGRESS NOTE   Diagnosis: Pancreas cancer  INTERVAL HISTORY:   [name] returns as scheduled.  He is no longer participating in physical therapy secondary to knee pain.  He is scheduled for orthopedic follow-up to evaluate the right knee and a callus at the left foot.  He reports a recent flare of erythema in the right lower leg.  This has improved.  No nausea or abdominal pain.  Objective:  Vital signs in last 24 hours:  Blood pressure (!) 147/65, pulse 79, temperature 98.1 F (36.7 C), temperature source Oral, resp. rate 20, height '5\' 11"'$  (1.803 m), weight 254 lb 12.8 oz (115.6 kg), SpO2 98 %.    HEENT: Oropharynx without visible mass, neck without mass Lymphatics: 1 cm soft mobile left posterior cervical node.  No other cervical, supraclavicular, axillary, or inguinal nodes Resp: Lungs clear bilaterally Cardio: Regular rate and rhythm GI: No hepatosplenomegaly, no apparent ascites, nontender Vascular: Chronic stasis change at the right greater than left lower leg.  Mild erythema at the right lower leg.   Portacath/PICC-without erythema  Lab Results:  Lab Results  Component Value Date   WBC 9.1 04/01/2022   HGB 11.5 (L) 04/01/2022   HCT 35.3 (L) 04/01/2022   MCV 93.1 04/01/2022   PLT 267 04/01/2022   NEUTROABS 4.8 04/01/2022    CMP  Lab Results  Component Value Date   NA 138 05/13/2022   K 4.2 05/13/2022   CL 105 05/13/2022   CO2 24 05/13/2022   GLUCOSE 175 (H) 05/13/2022   BUN 36 (H) 05/13/2022   CREATININE 1.65 (H) 05/13/2022   CALCIUM 9.5 05/13/2022   PROT 7.0 04/01/2022   ALBUMIN 3.5 04/01/2022   AST 19 04/01/2022   ALT 13 04/01/2022   ALKPHOS 123 04/01/2022   BILITOT 0.5 04/01/2022   GFRNONAA 43 (L) 05/13/2022   GFRAA 49 (L) 10/10/2020    Lab Results  Component Value Date   ZOX096 25 04/01/2022    Medications: I have reviewed the patient's current medications.   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  No pancreatic ductal dilatation identified.  No definite signs of metastatic disease in the chest, abdomen or pelvis. Cycle 1 gemcitabine/Abraxane 03/01/2021 Cycle 2 gemcitabine/Abraxane 03/14/2021 Cycle 3 gemcitabine/Abraxane 03/28/2021 Cycle 4 gemcitabine/Abraxane 04/17/2021 Cycle 5 gemcitabine/Abraxane 05/01/2021, Zofran/Decadron added Cycle 6 gemcitabine/Abraxane 05/15/2021 CT pancreas protocol 05/27/2021-unchanged 8 mm exophytic low-attenuation lesion at the posterior body of the pancreas 07/01/2021-distal pancreatectomy/splenectomy, 0.8 cm poorly differentiated adenocarcinoma, treatment response score-2, largest single foci of remaining tumor 0.2 cm, negative resection margins, 0/6 lymph nodes,ypT1bypNo, PanIN-1b Cycle 7 gemcitabine/Abraxane 08/14/2021 Cycle 8 gemcitabine/Abraxane 09/04/2021 Cycle 9 gemcitabine/Abraxane 09/18/2021 Cycle 10 gemcitabine/Abraxane 10/01/2021 Cycle 11 gemcitabine/Abraxane 10/16/2021 Cycle 12 gemcitabine/Abraxane 10/29/2021, Abraxane held secondary to neuropathy CT abdomen/pelvis 02/06/2022-acute interstitial edematous pancreatitis with small 1.6 cm acute peripancreatic fluid collection within the superior aspect of the pancreatic head; interval postoperative changes of distal pancreatectomy and splenectomy; stable benign left adrenal adenoma Diabetes Coronary artery disease Prostate  cancer  2013-treated with radiation at [facility] Hypertension Sleep apnea 7.  Coronary artery bypass surgery 2004 8.  Hospital admission 03/04/2021-syncope, A. Fib-started on Eliquis 9.  Mild thrombocytopenia following cycle 1 gemcitabine/Abraxane-resolved 10.  Anemia 11.  Admission 09/22/2021 with a right metatarsal ulcer and right leg cellulitis 12.  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 Lipsky remains in clinical remission from pancreas cancer.  The small left posterior cervical node is likely benign finding.  He will return for an office visit in 8 weeks.  He would like to keep the Port-A-Cath in place.  He will see orthopedics to evaluate the right knee pain and for management of a left foot callus.    Betsy Coder, MD  07/11/2022  9:25 AM

## 2022-07-11 NOTE — Patient Instructions (Signed)

## 2022-07-15 ENCOUNTER — Other Ambulatory Visit: Payer: Self-pay

## 2022-07-15 DIAGNOSIS — C259 Malignant neoplasm of pancreas, unspecified: Secondary | ICD-10-CM | POA: Diagnosis not present

## 2022-07-19 ENCOUNTER — Other Ambulatory Visit: Payer: Self-pay

## 2022-07-21 ENCOUNTER — Ambulatory Visit: Payer: Medicare Other | Admitting: Family Medicine

## 2022-08-05 ENCOUNTER — Ambulatory Visit: Payer: Self-pay

## 2022-08-05 ENCOUNTER — Ambulatory Visit (INDEPENDENT_AMBULATORY_CARE_PROVIDER_SITE_OTHER): Payer: Medicare Other

## 2022-08-05 ENCOUNTER — Encounter: Payer: Self-pay | Admitting: Orthopaedic Surgery

## 2022-08-05 ENCOUNTER — Ambulatory Visit (INDEPENDENT_AMBULATORY_CARE_PROVIDER_SITE_OTHER): Payer: Medicare Other | Admitting: Orthopaedic Surgery

## 2022-08-05 VITALS — BP 123/66 | HR 73 | Ht 71.0 in | Wt 250.0 lb

## 2022-08-05 DIAGNOSIS — G8929 Other chronic pain: Secondary | ICD-10-CM | POA: Diagnosis not present

## 2022-08-05 DIAGNOSIS — M170 Bilateral primary osteoarthritis of knee: Secondary | ICD-10-CM

## 2022-08-05 DIAGNOSIS — M25561 Pain in right knee: Secondary | ICD-10-CM | POA: Diagnosis not present

## 2022-08-05 DIAGNOSIS — M25562 Pain in left knee: Secondary | ICD-10-CM

## 2022-08-05 DIAGNOSIS — I2510 Atherosclerotic heart disease of native coronary artery without angina pectoris: Secondary | ICD-10-CM

## 2022-08-05 NOTE — Progress Notes (Unsigned)
Office Visit Note   Patient: [name]           Date of Birth: 02-11-1946           MRN: [id number] Visit Date: 08/05/2022              Requested by: [name], [name] G, MD [address] [place],  [name] [id number] PCP: [name], [name] G, MD   Assessment & Plan: Visit Diagnoses:  1. Chronic pain of both knees   2. Bilateral primary osteoarthritis of knee     Plan: TED hose applied.  He needs to plan for sitting in the morning tickly on the right leg.  Knee injection performed which she tolerated well.  He can follow-up in 3 months.  Follow-Up Instructions: Return in about 3 months (around 11/05/2022).   Orders:  Orders Placed This Encounter  Procedures   Large Joint Inj: L knee   XR KNEE 3 VIEW LEFT   XR KNEE 3 VIEW RIGHT   No orders of the defined types were placed in this encounter.     Procedures: Large Joint Inj: L knee on 08/06/2022 5:04 PM Indications: joint swelling and pain Details: 22 G 1.5 in needle, anterolateral approach  Arthrogram: No  Medications: 0.5 mL lidocaine 1 %; 3 mL bupivacaine 0.5 %; 40 mg methylPREDNISolone acetate 40 MG/ML Outcome: tolerated well, no immediate complications Procedure, treatment alternatives, risks and benefits explained, specific risks discussed. Consent was given by the patient. Immediately prior to procedure a time out was called to verify the correct patient, procedure, equipment, support staff and site/side marked as required. Patient was prepped and draped in the usual sterile fashion.       Clinical Data: No additional findings.   Subjective: Chief Complaint  Patient presents with   Right Knee - Pain   Left Knee - Pain    HPI 76 year old male with type 2 diabetes previously seen by me for training with callus on his foot without ulceration or osteomyelitis.  He is here with bilateral knee pain worse on the left and right knee.  His wife has been trimming his callus carefully.  He has had some  pain anteriorly over the knee.  He also has some right lower extremity edema and mild erythema with more pitting edema on the right than left foot.  He has had support stockings but states that he seemed tight to him and he does not like to wear them.  He does not know where they currently are  Review of Systems all other systems are updated.  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  Port-A-Cath, chemo.  All other systems noncontributory.   Objective: Vital Signs: BP 123/66   Pulse 73   Ht '5\' 11"'$  (1.803 m)   Wt 250 lb (113.4 kg)   BMI 34.87 kg/m   Physical Exam Constitutional:      Appearance: He is well-developed.  HENT:     Head: Normocephalic and atraumatic.     Right Ear: External ear normal.     Left Ear: External ear normal.  Eyes:     Pupils: Pupils are equal, round, and reactive to light.  Neck:     Thyroid: No thyromegaly.     Trachea: No tracheal deviation.  Cardiovascular:     Rate and Rhythm: Normal rate.  Pulmonary:     Effort: Pulmonary effort is normal.     Breath sounds: No  wheezing.  Abdominal:     General: Bowel sounds are normal.     Palpations: Abdomen is soft.  Musculoskeletal:     Cervical back: Neck supple.  Skin:    General: Skin is warm and dry.     Capillary Refill: Capillary refill takes less than 2 seconds.  Neurological:     Mental Status: He is alert and oriented to person, place, and time.  Psychiatric:        Behavior: Behavior normal.        Thought Content: Thought content normal.        Judgment: Judgment normal.     Ortho Exam bilateral knee of varus.  Left knee is more varus than right medial crepitus patellofemoral crepitus.  Negative logroll hips.  Specialty Comments:  No specialty comments available.  Imaging: Standing x-ray both knees lateral left knee sunrise patella view  demonstrates tricompartmental degenerative arthritis left knee not quite  as severe as the right  knee.   Impression: Moderate right knee osteoarthritis.   Three-view x-rays right knee including standing x-ray shows medial  bone-on-bone varus deformity right knee tricompartmental degenerative  arthritis.  No evidence of bony metastasis.   Impression moderate to severe right knee osteoarthritis with varus  bone-on-bone medial compartment.  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   Coronary artery disease of bypass graft of native heart with stable angina pectoris ([place]) 06/04/2021   Genetic testing 04/04/2021   History of prostate cancer    Family history of breast cancer    Port-A-Cath in place 03/14/2021   Atrial fibrillation ([place])    Syncope and collapse 03/04/2021   Primary cancer of body of pancreas ([place]) 01/31/2021   Chest pain of uncertain etiology 43/15/4008   Elevated lipase 10/17/2020   H/O agent Orange exposure 10/17/2020   Dyslipidemia, goal LDL below 70 10/17/2020   Hx of CABG 02/28/2020   DM (diabetes mellitus), type 2 with renal complications ([place]) 67/61/9509   Hypertension with heart disease 04/01/2019   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[place]) 04/01/2019   CKD (chronic kidney disease), stage III ([place]) 04/01/2019   Peripheral neuropathy 04/01/2019   Insomnia 11/30/2018   Past Medical History:  Diagnosis Date   A-fib ([place]) 03/04/2021   Anemia    Arthritis    Cancer ([facility])    prostate   Chronic  kidney disease    blood in urine    Constipation    Coronary artery disease    Depression    Diabetes mellitus without complication ([place])    Difficult intubation    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 it was hard to get the tube down his throat   Dyspnea    Family history of breast cancer    Fatty liver    Fatty liver    Frequent headaches    GERD (gastroesophageal reflux disease)    History of chicken pox    History of fainting spells of unknown cause    History of  prostate cancer    Hyperlipidemia    Hypertension    Myocardial infarction [facility])    Peripheral neuropathy    Pneumonia    Prostate cancer (H[facility])    PTSD (post-traumatic stress disorder)    Sleep apnea    uses Cpap    Family History  Problem Relation Age of Onset   Arthritis Mother    Heart disease Mother    Hyperlipidemia Mother    Hypertension Mother    Heart attack Mother    Breast cancer Sister        dx 43s   Heart attack Brother    Heart disease Brother    Depression Daughter    Arthritis Sister    Cancer Sister        unknown type, dx 15s   Cancer Brother    Learning disabilities Brother    Alcohol abuse Brother    COPD Brother    Heart attack Brother    Heart disease Brother     Past Surgical History:  Procedure Laterality Date   APPENDECTOMY     BIOPSY  01/15/2021   Procedure: BIOPSY;  Surgeon: Mansouraty, Telford Nab., MD;  Location: [place];  Service: Gastroenterology;;   CARDIAC CATHETERIZATION  06/26/2017   CARDIAC SURGERY     Triple Bypass   CHOLECYSTECTOMY  2010   COLONOSCOPY     CORONARY ARTERY BYPASS GRAFT  2004   ESOPHAGOGASTRODUODENOSCOPY (EGD) WITH PROPOFOL N/A 01/15/2021   Procedure: ESOPHAGOGASTRODUODENOSCOPY (EGD) WITH PROPOFOL;  Surgeon: Irving Copas., MD;  Location: [place];  Service: Gastroenterology;  Laterality: N/A;   EUS N/A 01/15/2021   Procedure: UPPER ENDOSCOPIC ULTRASOUND (EUS) RADIAL;  Surgeon: Irving Copas., MD;  Location: [place];   Service: Gastroenterology;  Laterality: N/A;   EYE SURGERY Bilateral    cataract removal   FINE NEEDLE ASPIRATION  01/15/2021   Procedure: FINE NEEDLE ASPIRATION (FNA) LINEAR;  Surgeon: Irving Copas., MD;  Location: [place];  Service: Gastroenterology;;   LAPAROSCOPY N/A 07/01/2021   Procedure: STAGING LAPAROSCOPY;  Surgeon: Dwan Bolt, MD;  Location: [place];  Service: General;  Laterality: N/A;   PORTACATH PLACEMENT Right 02/21/2021   Procedure: INSERTION PORT-A-CATH;  Surgeon: Dwan Bolt, MD;  Location: [place];  Service: General;  Laterality: Right;   SPLENECTOMY, 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   Procedure: SPLENECTOMY;  Surgeon: Dwan Bolt, MD;  Location: [place];  Service: General;  Laterality: N/A;   TONSILLECTOMY  1958   Social History   Occupational History   Occupation: retired  Tobacco Use   Smoking status: Former    Types: Cigarettes    Quit date: 12/14/1975    Years since quitting: 46.6   Smokeless tobacco: Never  Vaping Use   Vaping Use: Not on file  Substance and Sexual Activity   Alcohol use: Not Currently   Drug use: Never   Sexual activity: Not Currently

## 2022-08-06 DIAGNOSIS — M25562 Pain in left knee: Secondary | ICD-10-CM | POA: Diagnosis not present

## 2022-08-06 MED ORDER — LIDOCAINE HCL 1 % IJ SOLN
0.5000 mL | INTRAMUSCULAR | Status: AC | PRN
  Administered 2022-08-06: .5 mL

## 2022-08-06 MED ORDER — METHYLPREDNISOLONE ACETATE 40 MG/ML IJ SUSP
40.0000 mg | INTRAMUSCULAR | Status: AC | PRN
  Administered 2022-08-06: 40 mg via INTRA_ARTICULAR

## 2022-08-06 MED ORDER — BUPIVACAINE HCL 0.5 % IJ SOLN
3.0000 mL | INTRAMUSCULAR | Status: AC | PRN
  Administered 2022-08-06: 3 mL via INTRA_ARTICULAR

## 2022-09-03 DIAGNOSIS — R310 Gross hematuria: Secondary | ICD-10-CM | POA: Diagnosis not present

## 2022-09-03 DIAGNOSIS — N3000 Acute cystitis without hematuria: Secondary | ICD-10-CM | POA: Diagnosis not present

## 2022-09-03 DIAGNOSIS — R3915 Urgency of urination: Secondary | ICD-10-CM | POA: Diagnosis not present

## 2022-09-05 ENCOUNTER — Inpatient Hospital Stay: Payer: Medicare Other

## 2022-09-05 ENCOUNTER — Encounter: Payer: Self-pay | Admitting: Nurse Practitioner

## 2022-09-05 ENCOUNTER — Inpatient Hospital Stay (HOSPITAL_BASED_OUTPATIENT_CLINIC_OR_DEPARTMENT_OTHER): Payer: Medicare Other | Admitting: Nurse Practitioner

## 2022-09-05 ENCOUNTER — Encounter: Payer: Self-pay | Admitting: *Deleted

## 2022-09-05 ENCOUNTER — Encounter: Payer: Self-pay | Admitting: Family Medicine

## 2022-09-05 ENCOUNTER — Inpatient Hospital Stay: Payer: Medicare Other | Attending: Nurse Practitioner

## 2022-09-05 VITALS — BP 145/70 | HR 64 | Temp 98.1°F | Resp 20 | Ht 71.0 in | Wt 253.4 lb

## 2022-09-05 DIAGNOSIS — R599 Enlarged lymph nodes, unspecified: Secondary | ICD-10-CM | POA: Insufficient documentation

## 2022-09-05 DIAGNOSIS — C251 Malignant neoplasm of body of pancreas: Secondary | ICD-10-CM | POA: Diagnosis not present

## 2022-09-05 DIAGNOSIS — G4730 Sleep apnea, unspecified: Secondary | ICD-10-CM | POA: Diagnosis not present

## 2022-09-05 DIAGNOSIS — M25561 Pain in right knee: Secondary | ICD-10-CM | POA: Insufficient documentation

## 2022-09-05 DIAGNOSIS — M25562 Pain in left knee: Secondary | ICD-10-CM | POA: Insufficient documentation

## 2022-09-05 LAB — CBC WITH DIFFERENTIAL (CANCER CENTER ONLY)
Abs Immature Granulocytes: 0.02 10*3/uL (ref 0.00–0.07)
Basophils Absolute: 0 10*3/uL (ref 0.0–0.1)
Basophils Relative: 0 %
Eosinophils Absolute: 0.2 10*3/uL (ref 0.0–0.5)
Eosinophils Relative: 3 %
HCT: 34.4 % — ABNORMAL LOW (ref 39.0–52.0)
Hemoglobin: 12 g/dL — ABNORMAL LOW (ref 13.0–17.0)
Immature Granulocytes: 0 %
Lymphocytes Relative: 33 %
Lymphs Abs: 2.5 10*3/uL (ref 0.7–4.0)
MCH: 33.1 pg (ref 26.0–34.0)
MCHC: 34.9 g/dL (ref 30.0–36.0)
MCV: 94.8 fL (ref 80.0–100.0)
Monocytes Absolute: 0.7 10*3/uL (ref 0.1–1.0)
Monocytes Relative: 9 %
Neutro Abs: 4.1 10*3/uL (ref 1.7–7.7)
Neutrophils Relative %: 55 %
Platelet Count: 278 10*3/uL (ref 150–400)
RBC: 3.63 MIL/uL — ABNORMAL LOW (ref 4.22–5.81)
RDW: 14.5 % (ref 11.5–15.5)
WBC Count: 7.5 10*3/uL (ref 4.0–10.5)
nRBC: 0 % (ref 0.0–0.2)

## 2022-09-05 NOTE — Progress Notes (Signed)
Routed referral order and office note to Dr. Kara Mead.

## 2022-09-05 NOTE — Progress Notes (Signed)
[name] OFFICE PROGRESS NOTE   Diagnosis: Pancreas cancer  INTERVAL HISTORY:   [name] returns as scheduled.  Overall feels well.  He has a good appetite.  Weight is stable.  No abdominal pain.  Continues to have bilateral knee pain.  Had a recent injection to the left knee with no significant improvement.  Objective:  Vital signs in last 24 hours:  Blood pressure (!) 145/70, pulse 64, temperature 98.1 F (36.7 C), temperature source Oral, resp. rate 20, height '5\' 11"'$  (1.803 m), weight 253 lb 6.4 oz (114.9 kg), SpO2 98 %.    HEENT: No thrush or ulcers. Lymphatics: Less than 1 cm soft mobile left posterior cervical node.  No other palpable cervical, supraclavicular, axillary or inguinal lymph nodes. Resp: Lungs clear bilaterally. Cardio: Regular rate and rhythm. GI: Abdomen soft and nontender.  No hepatosplenomegaly. Vascular: Chronic stasis change right greater than left lower leg.  Erythema right lower leg, superficial abrasion that does not appear infected.  Port-A-Cath without erythema.  Lab Results:  Lab Results  Component Value Date   WBC 7.5 09/05/2022   HGB 12.0 (L) 09/05/2022   HCT 34.4 (L) 09/05/2022   MCV 94.8 09/05/2022   PLT 278 09/05/2022   NEUTROABS 4.1 09/05/2022    Imaging:  No results found.  Medications: I have reviewed the patient's current medications.  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  No pancreatic ductal dilatation identified.  No definite signs of metastatic disease in the chest, abdomen or pelvis. Cycle 1 gemcitabine/Abraxane 03/01/2021 Cycle 2 gemcitabine/Abraxane 03/14/2021 Cycle 3 gemcitabine/Abraxane 03/28/2021 Cycle 4 gemcitabine/Abraxane 04/17/2021 Cycle 5 gemcitabine/Abraxane 05/01/2021, Zofran/Decadron added Cycle 6 gemcitabine/Abraxane 05/15/2021 CT pancreas protocol 05/27/2021-unchanged 8 mm exophytic low-attenuation lesion at the posterior body of the pancreas 07/01/2021-distal pancreatectomy/splenectomy, 0.8 cm poorly differentiated adenocarcinoma, treatment response score-2, largest single foci of remaining tumor 0.2 cm, negative resection margins, 0/6 lymph nodes,ypT1bypNo, PanIN-1b Cycle 7 gemcitabine/Abraxane 08/14/2021 Cycle 8 gemcitabine/Abraxane 09/04/2021 Cycle 9 gemcitabine/Abraxane 09/18/2021 Cycle 10 gemcitabine/Abraxane 10/01/2021 Cycle 11 gemcitabine/Abraxane 10/16/2021 Cycle 12 gemcitabine/Abraxane 10/29/2021, Abraxane held secondary to neuropathy CT abdomen/pelvis 02/06/2022-acute interstitial edematous pancreatitis with small 1.6 cm acute peripancreatic fluid collection within the superior aspect of the pancreatic head; interval postoperative changes of distal pancreatectomy and splenectomy; stable benign left adrenal adenoma Diabetes Coronary artery disease Prostate cancer 2013-[facility] with [facility] at [facility] Hypertension Sleep apnea 7.  Coronary artery bypass surgery 2004 8.  Hospital admission 03/04/2021-syncope, A. Fib-started on Eliquis 9.  Mild thrombocytopenia following cycle 1 gemcitabine/Abraxane-resolved 10.  Anemia 11.  Admission 09/22/2021 with a right metatarsal ulcer and right leg cellulitis 12.  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 remains in clinical remission from pancreas cancer we will follow-up on the CA 19-9 from  today.  The left posterior cervical lymph node is smaller, likely benign.  He will return for lab, port flush, follow-up in 8 weeks.  He requested a referral to Dr. Elsworth Soho with pulmonary for follow-up/management of sleep apnea, CPAP.  Ned Card ANP/GNP-BC   09/05/2022  11:03 AM

## 2022-09-06 LAB — CANCER ANTIGEN 19-9: CA 19-9: 22 U/mL (ref 0–35)

## 2022-09-08 ENCOUNTER — Encounter (HOSPITAL_BASED_OUTPATIENT_CLINIC_OR_DEPARTMENT_OTHER): Payer: Self-pay | Admitting: Cardiology

## 2022-09-08 ENCOUNTER — Ambulatory Visit (INDEPENDENT_AMBULATORY_CARE_PROVIDER_SITE_OTHER): Payer: Medicare Other | Admitting: Cardiology

## 2022-09-08 ENCOUNTER — Telehealth: Payer: Self-pay

## 2022-09-08 VITALS — BP 122/60 | HR 81 | Ht 71.0 in | Wt 253.5 lb

## 2022-09-08 DIAGNOSIS — I10 Essential (primary) hypertension: Secondary | ICD-10-CM | POA: Diagnosis not present

## 2022-09-08 DIAGNOSIS — I2510 Atherosclerotic heart disease of native coronary artery without angina pectoris: Secondary | ICD-10-CM

## 2022-09-08 DIAGNOSIS — I480 Paroxysmal atrial fibrillation: Secondary | ICD-10-CM

## 2022-09-08 DIAGNOSIS — E118 Type 2 diabetes mellitus with unspecified complications: Secondary | ICD-10-CM | POA: Diagnosis not present

## 2022-09-08 DIAGNOSIS — Z951 Presence of aortocoronary bypass graft: Secondary | ICD-10-CM | POA: Diagnosis not present

## 2022-09-08 DIAGNOSIS — E785 Hyperlipidemia, unspecified: Secondary | ICD-10-CM | POA: Diagnosis not present

## 2022-09-08 DIAGNOSIS — Z794 Long term (current) use of insulin: Secondary | ICD-10-CM | POA: Diagnosis not present

## 2022-09-08 NOTE — Patient Instructions (Addendum)
Medication Instructions:  Based on our discussion today, here is what you should be taking: Amlodipine 5 mg daily Furosemide 20 mg daily Hydralazine 25 mg three times daily Valsartan--based on what I can see, this should be 160 mg daily. If your kidney doctor has changed this for you, go by the kidney doctor's recommendation. If not, it appears this is the last recommended dose.  Apixaban 5 mg twice daily as a blood thinner  Atorvastatin 40 mg (half of 80 mg tab) daily for cholesterol  Your blood pressure today is perfect, but this is on the 320 mg of valsartan. With the decrease back to 160 mg valsartan, I would watch your blood pressure. If it is consistently >130/80, then we may need to increase the amlodipine or the hydralazine. If the blood pressure is high like this, let us know.  *If you need a refill on your cardiac medications before your next appointment, please call your pharmacy*   Lab Work: None ordered today   Testing/Procedures: None ordered today   Follow-Up: At [facility], you and your health needs are our priority.  As part of our continuing mission to provide you with exceptional heart care, we have created designated Provider Care Teams.  These Care Teams include your primary Cardiologist (physician) and Advanced Practice Providers (APPs -  Physician Assistants and Nurse Practitioners) who all work together to provide you with the care you need, when you need it.  We recommend signing up for the patient portal called "MyChart".  Sign up information is provided on this After Visit Summary.  MyChart is used to connect with patients for Virtual Visits (Telemedicine).  Patients are able to view lab/test results, encounter notes, upcoming appointments, etc.  Non-urgent messages can be sent to your provider as well.   To learn more about what you can do with MyChart, go to [email or web address].    Your next appointment:   6 month(s)  The format for your  next appointment:   In Person  Provider:   Buford Dresser, MD

## 2022-09-08 NOTE — Telephone Encounter (Signed)
Nothing further needed 

## 2022-09-08 NOTE — Progress Notes (Signed)
Cardiology Office Note:    Date:  09/08/2022   ID:  Jorryn, Casagrande 05-24-1946, MRN [id number]  PCP:  [name], [name] G, MD  Cardiologist:  Buford Dresser, MD  Referring MD: [name], [name] G, MD   CC: follow up  History of Present Illness:    [name] is a 76 y.o. male with a hx of atrial fibrillation, myocardial infarction, coronary artery disease, hypertension, hyperlipidemia, dyspnea, sleep apnea, chronic kidney disease, diabetes mellitus, cancer, who is seen  for follow up today. I have not seen him previously  He was seen on 05/05/2022 by Laurann Montana, NP, for a follow up hypertension appointment. He had been feeling well and tolerating his medications well. His medication regimen was continued. Follow up was recommended 4 months.  Today:   He is accompanied by his wife. He appears to be doing well. They brought in his current medications and the dosages were discussed at length.  He states that his biggest problem is shortness of breath, and that it is getting worse. He reports that just doing the laundry will make him need to stop and rest.  Additionally, he mentions that his legs swell up every day. When he wakes up in the morning he has no swelling, but throughout the day they swell more and more.  His most strenuous usual activity is going outside to water his flowers each day. His wife states that since he's been doing this activity, it has improved his mobility. He and his wife have not been exercising as much since she had a fall back around May 2023.  He denies any palpitations or chest pain. No lightheadedness, headaches, syncope, orthopnea, or PND.  Past Medical History:  Diagnosis Date   A-fib ([place]) 03/04/2021   Anemia    Arthritis    Cancer (HCC)    prostate   Chronic kidney disease    blood in urine    Constipation    Coronary artery disease    Depression    Diabetes mellitus without complication ([place])    Difficult intubation     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 it was hard to get the tube down his throat   Dyspnea    Family history of breast cancer    Fatty liver    Fatty liver    Frequent headaches    GERD (gastroesophageal reflux disease)    History of chicken pox    History of fainting spells of unknown cause    History of prostate cancer    Hyperlipidemia    Hypertension    Myocardial infarction [facility])    Peripheral neuropathy    Pneumonia    Prostate cancer ([place])    PTSD (post-traumatic stress disorder)    Sleep apnea    uses Cpap    Past Surgical History:  Procedure Laterality Date   APPENDECTOMY     BIOPSY  01/15/2021   Procedure: BIOPSY;  Surgeon: Irving Copas., MD;  Location: [place];  Service: Gastroenterology;;   CARDIAC CATHETERIZATION  06/26/2017   CARDIAC SURGERY     Triple Bypass   CHOLECYSTECTOMY  2010   COLONOSCOPY     CORONARY ARTERY BYPASS GRAFT  2004   ESOPHAGOGASTRODUODENOSCOPY (EGD) WITH PROPOFOL N/A 01/15/2021   Procedure: ESOPHAGOGASTRODUODENOSCOPY (EGD) WITH PROPOFOL;  Surgeon: Irving Copas., MD;  Location: [facility] ENDOSCOPY;  Service: Gastroenterology;  Laterality: N/A;   EUS N/A 01/15/2021   Procedure: UPPER ENDOSCOPIC ULTRASOUND (EUS) RADIAL;  Surgeon: Irving Copas., MD;  Location:  [place] ENDOSCOPY;  Service: Gastroenterology;  Laterality: N/A;   EYE SURGERY Bilateral    cataract removal   FINE NEEDLE ASPIRATION  01/15/2021   Procedure: FINE NEEDLE ASPIRATION (FNA) LINEAR;  Surgeon: Irving Copas., MD;  Location: [facility] ENDOSCOPY;  Service: Gastroenterology;;   LAPAROSCOPY N/A 07/01/2021   Procedure: STAGING LAPAROSCOPY;  Surgeon: Dwan Bolt, MD;  Location: [place];  Service: General;  Laterality: N/A;   PORTACATH PLACEMENT Right 02/21/2021   Procedure: INSERTION PORT-A-CATH;  Surgeon: Dwan Bolt, MD;  Location: [place];  Service: General;  Laterality: Right;   SPLENECTOMY, 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   Procedure: SPLENECTOMY;  Surgeon: Dwan Bolt, MD;  Location: [place];  Service: General;  Laterality: N/A;   TONSILLECTOMY  1958    Current Medications: Current Outpatient Medications on File Prior to Visit  Medication Sig   acetaminophen (TYLENOL) 500 MG tablet Take 1,000 mg by mouth 2 (two) times daily.   amLODipine (NORVASC) 5 MG tablet Take 1 tablet (5 mg total) by mouth daily.   apixaban (ELIQUIS) 5 MG TABS tablet Take 5 mg by mouth 2 (two) times daily.   atorvastatin (LIPITOR) 80 MG tablet Take 40 mg by mouth at bedtime.   calcium-vitamin D (OSCAL WITH D) 500-200 MG-UNIT tablet Take 1 tablet by mouth 2 (two) times daily.   clotrimazole-betamethasone (LOTRISONE) cream Apply 1 application topically daily as needed.   Continuous Blood Gluc Sensor (DEXCOM G6 SENSOR) MISC Change sensor q 10 days.   Continuous Blood Gluc Transmit (DEXCOM G6 TRANSMITTER) MISC 1 Device by Does not apply route every 3 (three) months.   cycloSPORINE (RESTASIS) 0.05 % ophthalmic emulsion Place 1 drop into both eyes 2 (two) times daily as needed (dry eyes).   ferrous sulfate 325 (65 FE) MG tablet Take 1 tablet (325 mg total) by mouth daily with breakfast.   FLUoxetine (PROZAC) 20 MG capsule Take 40 mg by mouth daily.   fluticasone (FLONASE) 50 MCG/ACT nasal spray Place 1 spray into both nostrils daily.   furosemide (LASIX) 20 MG tablet Take 1 tablet (20 mg total) by mouth daily.   glucose blood (ONETOUCH VERIO) test strip Use to test blood sugars 1-2 times daily.   hydrALAZINE (APRESOLINE) 25 MG tablet Take 1 tablet (25 mg total) by mouth 3 (three) times daily.   insulin glargine (LANTUS) 100 UNIT/ML injection Inject 0.45 mLs (45 Units total) into the skin daily.   insulin lispro (HUMALOG) 100 UNIT/ML injection Per sliding scale.   lidocaine-prilocaine (EMLA) cream Apply 1 application. topically as needed.   loratadine (CLARITIN) 10 MG tablet Take 10 mg by mouth daily.   Multiple Vitamin (MULTI-VITAMINS) TABS Take 1 tablet by mouth daily.   nitroGLYCERIN  (NITROSTAT) 0.3 MG SL tablet Place 0.3 mg under the tongue every 5 (five) minutes as needed for chest pain.   nystatin powder Apply 1 application topically 3 (three) times daily.   pantoprazole (PROTONIX) 40 MG tablet Take 1 tablet (40 mg total) by mouth daily.   tamsulosin (FLOMAX) 0.4 MG CAPS capsule Take 0.8 mg by mouth at bedtime.   traZODone (DESYREL) 100 MG tablet Take 100 mg by mouth at bedtime.   valsartan (DIOVAN) 160 MG tablet Take 0.5 tablets (80 mg total) by mouth daily.   vitamin B-12 (CYANOCOBALAMIN) 1000 MCG tablet Take 1,000 mcg by mouth daily.   Wheat Dextrin (BENEFIBER DRINK MIX PO) Take by mouth daily.   No current facility-administered medications on file prior to visit.  Allergies:   Keflex [cephalexin], Lisinopril, and Terazosin   Social History   Tobacco Use   Smoking status: Former    Types: Cigarettes    Quit date: 12/14/1975    Years since quitting: 46.7   Smokeless tobacco: Never  Substance Use Topics   Alcohol use: Not Currently   Drug use: Never    Family History: family history includes Alcohol abuse in his brother; Arthritis in his mother and sister; Breast cancer in his sister; COPD in his brother; Cancer in his brother and sister; Depression in his daughter; Heart attack in his brother, brother, and mother; Heart disease in his brother, brother, and mother; Hyperlipidemia in his mother; Hypertension in his mother; Learning disabilities in his brother.  ROS:   Please see the history of present illness.   (+) Shortness of breath (+) Bilateral LE edema  Additional pertinent ROS otherwise negative.   EKGs/Labs/Other Studies Reviewed:    The following studies were reviewed today:  CT Abdomen Pelvis 02/06/2022: IMPRESSION: 1. Acute interstitial edematous pancreatitis with small 1.6 cm acute peripancreatic fluid collection within the superior aspect of the pancreatic head. 2. Interval postoperative changes of distal pancreatectomy  and splenectomy. 3. Stable benign left adrenal adenoma. 4. Prostatomegaly. 5.  Aortic Atherosclerosis (ICD10-I70.0).   RLE Venous Doppler 09/24/2021: Summary:  RIGHT:  - There is no evidence of deep vein thrombosis in the lower extremity.     - No cystic structure found in the popliteal fossa.     LEFT:  - No evidence of common femoral vein obstruction.    Echo 03/04/2021: 1. Left ventricular ejection fraction, by estimation, is 60 to 65%. The  left ventricle has normal function. The left ventricle has no regional  wall motion abnormalities. Left ventricular diastolic parameters are  indeterminate.   2. Right ventricular systolic function is normal. The right ventricular  size is normal. There is normal pulmonary artery systolic pressure.   3. The mitral valve is normal in structure. No evidence of mitral valve  regurgitation.   4. The aortic valve is normal in structure. Aortic valve regurgitation is  not visualized.   5. The inferior vena cava is normal in size with greater than 50%  respiratory variability, suggesting right atrial pressure of 3 mmHg.    EKG:  EKG is personally reviewed.   09/08/22: not ordered today  Recent Labs: 09/27/2021: Magnesium 1.8 07/11/2022: ALT 12; BUN 29; Creatinine 1.45; Potassium 4.2; Sodium 134 09/05/2022: Hemoglobin 12.0; Platelet Count 278  Recent Lipid Panel    Component Value Date/Time   CHOL 141 02/21/2022 1156   CHOL 101 12/16/2018 1003   TRIG 140.0 02/21/2022 1156   HDL 46.80 02/21/2022 1156   HDL 38 (L) 12/16/2018 1003   CHOLHDL 3 02/21/2022 1156   VLDL 28.0 02/21/2022 1156   LDLCALC 66 02/21/2022 1156   LDLCALC 55 10/10/2020 0940    Physical Exam:    VS:  BP 122/60 (BP Location: Right Arm, Patient Position: Sitting, Cuff Size: Large)   Pulse 81   Ht '5\' 11"'$  (1.803 m)   Wt 253 lb 8 oz (115 kg)   SpO2 96%   BMI 35.36 kg/m     Wt Readings from Last 3 Encounters:  09/08/22 253 lb 8 oz (115 kg)  09/05/22 253 lb 6.4 oz  (114.9 kg)  08/05/22 250 lb (113.4 kg)    GEN: Well nourished, well developed in no acute distress HEENT: Normal, moist mucous membranes NECK: No JVD CARDIAC: regular rhythm, normal  S1 and S2, no rubs or gallops. No murmur. VASCULAR: Radial and DP pulses 2+ bilaterally. No carotid bruits RESPIRATORY:  Clear to auscultation without rales, wheezing or rhonchi  ABDOMEN: Soft, non-tender, non-distended MUSCULOSKELETAL:  Ambulates independently SKIN: Warm and dry, no edema NEUROLOGIC:  Alert and oriented x 3. No focal neuro deficits noted. PSYCHIATRIC:  Normal affect    ASSESSMENT:    1. Essential hypertension   2. Coronary artery disease involving native coronary artery of native heart without angina pectoris   3. S/P CABG (coronary artery bypass graft)   4. Paroxysmal atrial fibrillation (HCC)   5. Hyperlipidemia, unspecified hyperlipidemia type   6. Type 2 diabetes mellitus with complication, with long-term current use of insulin (HCC)    PLAN:    Hypertension: -at goal today -continue amlodipine 5 mg daily, furosemide 20 mg daily, hydralazine 25 mg three times daily -he is unclear as to what dose of valsartan he is taking. This was previously decreased due to worsening kidney function. He will check his bottles at home, given instructions on this  CAD s/p prior CABG -no angina -continue atorvastatin 80 mg daily -no aspirin as he is on apixaban  Paroxysmal atrial fibrillation -continue apixaban -chadsvasc=5  Type II diabetes, on insulin -would consider SLGT2i or GLP1RA. Unclear history of chronic kidney disease, would consider SGLT2i first if renal function stable and no other contraindications  Cardiac risk counseling and prevention recommendations: -recommend heart healthy/Mediterranean diet, with whole grains, fruits, vegetable, fish, lean meats, nuts, and olive oil. Limit salt. -recommend moderate walking, 3-5 times/week for 30-50 minutes each session. Aim for at least  150 minutes.week. Goal should be pace of 3 miles/hours, or walking 1.5 miles in 30 minutes -recommend avoidance of tobacco products. Avoid excess alcohol.  Plan for follow up: 6 months.  Buford Dresser, MD, PhD, [facility]    Medication Adjustments/Labs and Tests Ordered: Current medicines are reviewed at length with the patient today.  Concerns regarding medicines are outlined above.  No orders of the defined types were placed in this encounter.  No orders of the defined types were placed in this encounter.  Patient Instructions  Medication Instructions:  Based on our discussion today, here is what you should be taking: Amlodipine 5 mg daily Furosemide 20 mg daily Hydralazine 25 mg three times daily Valsartan--based on what I can see, this should be 160 mg daily. If your kidney doctor has changed this for you, go by the kidney doctor's recommendation. If not, it appears this is the last recommended dose.  Apixaban 5 mg twice daily as a blood thinner  Atorvastatin 40 mg (half of 80 mg tab) daily for cholesterol  Your blood pressure today is perfect, but this is on the 320 mg of valsartan. With the decrease back to 160 mg valsartan, I would watch your blood pressure. If it is consistently >130/80, then we may need to increase the amlodipine or the hydralazine. If the blood pressure is high like this, let us know.  *If you need a refill on your cardiac medications before your next appointment, please call your pharmacy*   Lab Work: None ordered today   Testing/Procedures: None ordered today   Follow-Up: At [facility], you and your health needs are our priority.  As part of our continuing mission to provide you with exceptional heart care, we have created designated Provider Care Teams.  These Care Teams include your primary Cardiologist (physician) and Advanced Practice Providers (APPs -  Physician Assistants and Nurse Practitioners) who  all work together to provide you with the care you need, when you need it.  We recommend signing up for the patient portal called "MyChart".  Sign up information is provided on this After Visit Summary.  MyChart is used to connect with patients for Virtual Visits (Telemedicine).  Patients are able to view lab/test results, encounter notes, upcoming appointments, etc.  Non-urgent messages can be sent to your provider as well.   To learn more about what you can do with MyChart, go to [email or web address].    Your next appointment:   6 month(s)  The format for your next appointment:   In Person  Provider:   Buford Dresser, MD         I,Breanna Adamick,acting as a scribe for Buford Dresser, MD.,have documented all relevant documentation on the behalf of Buford Dresser, MD,as directed by  Buford Dresser, MD while in the presence of Buford Dresser, MD.  I, Buford Dresser, MD, have reviewed all documentation for this visit. The documentation on 09/28/22 for the exam, diagnosis, procedures, and orders are all accurate and complete.   Signed, Buford Dresser, MD PhD 09/08/2022     [place]

## 2022-09-23 ENCOUNTER — Encounter: Payer: Self-pay | Admitting: *Deleted

## 2022-09-23 NOTE — Progress Notes (Signed)
Confirmed with Mr. Amalfitano that he has not heard from Dr. Bari Mantis office yet. Faxed referral order, demograhics and note to office at [phone].

## 2022-09-26 ENCOUNTER — Ambulatory Visit: Payer: Medicare Other | Admitting: Orthopaedic Surgery

## 2022-09-28 ENCOUNTER — Encounter (HOSPITAL_BASED_OUTPATIENT_CLINIC_OR_DEPARTMENT_OTHER): Payer: Self-pay | Admitting: Cardiology

## 2022-10-14 ENCOUNTER — Ambulatory Visit (INDEPENDENT_AMBULATORY_CARE_PROVIDER_SITE_OTHER): Payer: Medicare Other | Admitting: Orthopaedic Surgery

## 2022-10-14 ENCOUNTER — Encounter: Payer: Self-pay | Admitting: Orthopaedic Surgery

## 2022-10-14 VITALS — BP 118/57 | HR 78 | Ht 71.0 in | Wt 250.0 lb

## 2022-10-14 DIAGNOSIS — M1712 Unilateral primary osteoarthritis, left knee: Secondary | ICD-10-CM

## 2022-10-14 DIAGNOSIS — M170 Bilateral primary osteoarthritis of knee: Secondary | ICD-10-CM

## 2022-10-14 DIAGNOSIS — I2510 Atherosclerotic heart disease of native coronary artery without angina pectoris: Secondary | ICD-10-CM | POA: Diagnosis not present

## 2022-10-14 NOTE — Progress Notes (Unsigned)
Office Visit Note   Patient: [name]           Date of Birth: 21-Jun-1946           MRN: [id number] Visit Date: 10/14/2022              Requested by: [name], [name] G, MD [address] [place],  [place] [address] PCP: [name], [name] G, MD   Assessment & Plan: Visit Diagnoses: No diagnosis found.  Plan: ***  Follow-Up Instructions: No follow-ups on file.   Orders:  No orders of the defined types were placed in this encounter.  No orders of the defined types were placed in this encounter.     Procedures: Large Joint Inj: L knee on 10/14/2022 3:19 PM Indications: joint swelling and pain Details: 22 G 1.5 in needle, anterolateral approach  Arthrogram: No  Medications: 0.5 mL lidocaine 1 %; 3 mL bupivacaine 0.5 %; 40 mg methylPREDNISolone acetate 40 MG/ML Outcome: tolerated well, no immediate complications Procedure, treatment alternatives, risks and benefits explained, specific risks discussed. Consent was given by the patient. Immediately prior to procedure a time out was called to verify the correct patient, procedure, equipment, support staff and site/side marked as required. Patient was prepped and draped in the usual sterile fashion.       Clinical Data: No additional findings.   Subjective: Chief Complaint  Patient presents with   Left Knee - Follow-up, Pain   Right Foot - Follow-up, Pain    HPI  Review of Systems   Objective: Vital Signs: BP (!) 118/57   Pulse 78   Ht '5\' 11"'$  (1.803 m)   Wt 250 lb (113.4 kg)   BMI 34.87 kg/m   Physical Exam  Ortho Exam  Specialty Comments:  No specialty comments available.  Imaging: No results found.   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) 06/04/2021   Genetic testing 04/04/2021   History of prostate cancer    Family history of breast cancer    Port-A-Cath in place 03/14/2021   Atrial fibrillation ([place])    Syncope and collapse 03/04/2021   Primary cancer of body of pancreas ([place]) 01/31/2021   Chest pain of uncertain etiology 62/95/2841   Elevated lipase 10/17/2020   H/O agent [place] exposure 10/17/2020   Dyslipidemia, goal LDL below 70 10/17/2020   Hx of CABG 02/28/2020   DM (diabetes mellitus), type 2 with renal complications ([place]) 32/44/0102   Hypertension with heart disease 04/01/2019   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[place]) 04/01/2019   CKD (chronic kidney disease), stage III ([place]) 04/01/2019   Peripheral neuropathy 04/01/2019   Insomnia 11/30/2018   Past Medical History:  Diagnosis Date   A-fib ([place]) 03/04/2021   Anemia    Arthritis    Cancer (HCC)    prostate   Chronic kidney disease    blood in urine  Constipation    Coronary artery disease    Depression    Diabetes mellitus without complication ([place])    Difficult intubation    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 it was hard to get the tube down his throat   Dyspnea    Family history of breast cancer    Fatty liver    Fatty liver    Frequent headaches    GERD (gastroesophageal reflux disease)    History of chicken pox    History of fainting spells of unknown cause    History of prostate cancer    Hyperlipidemia    Hypertension    Myocardial infarction ([place])    Peripheral neuropathy    Pneumonia    Prostate cancer (HCC)    PTSD (post-traumatic  stress disorder)    Sleep apnea    uses Cpap    Family History  Problem Relation Age of Onset   Arthritis Mother    Heart disease Mother    Hyperlipidemia Mother    Hypertension Mother    Heart attack Mother    Breast cancer Sister        dx 48s   Heart attack Brother    Heart disease Brother    Depression Daughter    Arthritis Sister    Cancer Sister        unknown type, dx 16s   Cancer Brother    Learning disabilities Brother    Alcohol abuse Brother    COPD Brother    Heart attack Brother    Heart disease Brother     Past Surgical History:  Procedure Laterality Date   APPENDECTOMY     BIOPSY  01/15/2021   Procedure: BIOPSY;  Surgeon: Mansouraty, Telford Nab., MD;  Location[facility] ENDOSCOPY;  Service: Gastroenterology;;   CARDIAC CATHETERIZATION  06/26/2017   CARDIAC SURGERY     Triple Bypass   CHOLECYSTECTOMY  2010   COLONOSCOPY     CORONARY ARTERY BYPASS GRAFT  2004   ESOPHAGOGASTRODUODENOSCOPY (EGD) WITH PROPOFOL N/A 01/15/2021   Procedure: ESOPHAGOGASTRODUODENOSCOPY (EGD) WITH PROPOFOL;  Surgeon: Irving Copas., MD;  Location: [facility] ENDOSCOPY;  Service: Gastroenterology;  Laterality: N/A;   EUS N/A 01/15/2021   Procedure: UPPER ENDOSCOPIC ULTRASOUND (EUS) RADIAL;  Surgeon: Irving Copas., MD;  Location: [place];  Service: Gastroenterology;  Laterality: N/A;   EYE SURGERY Bilateral    cataract removal   FINE NEEDLE ASPIRATION  01/15/2021   Procedure: FINE NEEDLE ASPIRATION (FNA) LINEAR;  Surgeon: Irving Copas., MD;  Location: [place];  Service: Gastroenterology;;   LAPAROSCOPY N/A 07/01/2021   Procedure: STAGING LAPAROSCOPY;  Surgeon: Dwan Bolt, MD;  Location: [place];  Service: General;  Laterality: N/A;   PORTACATH PLACEMENT Right 02/21/2021   Procedure: INSERTION PORT-A-CATH;  Surgeon: Dwan Bolt, MD;  Location: [place];  Service: General;  Laterality: Right;   SPLENECTOMY, 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   Procedure: SPLENECTOMY;   Surgeon: Dwan Bolt, MD;  Location: [place];  Service: General;  Laterality: N/A;   TONSILLECTOMY  1958   Social History   Occupational History   Occupation: retired  Tobacco Use   Smoking status: Former    Types: Cigarettes    Quit date: 12/14/1975    Years since quitting: 46.8   Smokeless tobacco: Never  Vaping Use   Vaping Use: Not on file  Substance and Sexual Activity   Alcohol use: Not Currently   Drug use: Never   Sexual activity: Not Currently

## 2022-10-15 DIAGNOSIS — M170 Bilateral primary osteoarthritis of knee: Secondary | ICD-10-CM | POA: Insufficient documentation

## 2022-10-15 MED ORDER — METHYLPREDNISOLONE ACETATE 40 MG/ML IJ SUSP
40.0000 mg | INTRAMUSCULAR | Status: AC | PRN
  Administered 2022-10-14: 40 mg via INTRA_ARTICULAR

## 2022-10-15 MED ORDER — LIDOCAINE HCL 1 % IJ SOLN
0.5000 mL | INTRAMUSCULAR | Status: AC | PRN
  Administered 2022-10-14: .5 mL

## 2022-10-15 MED ORDER — BUPIVACAINE HCL 0.5 % IJ SOLN
3.0000 mL | INTRAMUSCULAR | Status: AC | PRN
  Administered 2022-10-14: 3 mL via INTRA_ARTICULAR

## 2022-10-20 DIAGNOSIS — N3000 Acute cystitis without hematuria: Secondary | ICD-10-CM | POA: Diagnosis not present

## 2022-10-31 ENCOUNTER — Inpatient Hospital Stay: Payer: Medicare Other | Attending: Nurse Practitioner

## 2022-10-31 ENCOUNTER — Inpatient Hospital Stay (HOSPITAL_BASED_OUTPATIENT_CLINIC_OR_DEPARTMENT_OTHER): Payer: Medicare Other | Admitting: Oncology

## 2022-10-31 ENCOUNTER — Inpatient Hospital Stay: Payer: Medicare Other

## 2022-10-31 VITALS — BP 147/70 | HR 80 | Temp 98.1°F | Resp 18 | Ht 71.0 in | Wt 260.0 lb

## 2022-10-31 DIAGNOSIS — I10 Essential (primary) hypertension: Secondary | ICD-10-CM | POA: Diagnosis not present

## 2022-10-31 DIAGNOSIS — E1140 Type 2 diabetes mellitus with diabetic neuropathy, unspecified: Secondary | ICD-10-CM | POA: Diagnosis not present

## 2022-10-31 DIAGNOSIS — Z8546 Personal history of malignant neoplasm of prostate: Secondary | ICD-10-CM | POA: Insufficient documentation

## 2022-10-31 DIAGNOSIS — C251 Malignant neoplasm of body of pancreas: Secondary | ICD-10-CM

## 2022-10-31 DIAGNOSIS — Z7901 Long term (current) use of anticoagulants: Secondary | ICD-10-CM | POA: Insufficient documentation

## 2022-10-31 DIAGNOSIS — I4891 Unspecified atrial fibrillation: Secondary | ICD-10-CM | POA: Insufficient documentation

## 2022-10-31 DIAGNOSIS — N289 Disorder of kidney and ureter, unspecified: Secondary | ICD-10-CM | POA: Insufficient documentation

## 2022-10-31 DIAGNOSIS — D649 Anemia, unspecified: Secondary | ICD-10-CM | POA: Insufficient documentation

## 2022-10-31 LAB — CBC WITH DIFFERENTIAL (CANCER CENTER ONLY)
Abs Immature Granulocytes: 0.01 10*3/uL (ref 0.00–0.07)
Basophils Absolute: 0 10*3/uL (ref 0.0–0.1)
Basophils Relative: 0 %
Eosinophils Absolute: 0.1 10*3/uL (ref 0.0–0.5)
Eosinophils Relative: 2 %
HCT: 34.6 % — ABNORMAL LOW (ref 39.0–52.0)
Hemoglobin: 11.4 g/dL — ABNORMAL LOW (ref 13.0–17.0)
Immature Granulocytes: 0 %
Lymphocytes Relative: 41 %
Lymphs Abs: 3.3 10*3/uL (ref 0.7–4.0)
MCH: 31.9 pg (ref 26.0–34.0)
MCHC: 32.9 g/dL (ref 30.0–36.0)
MCV: 96.9 fL (ref 80.0–100.0)
Monocytes Absolute: 0.8 10*3/uL (ref 0.1–1.0)
Monocytes Relative: 10 %
Neutro Abs: 3.9 10*3/uL (ref 1.7–7.7)
Neutrophils Relative %: 47 %
Platelet Count: 237 10*3/uL (ref 150–400)
RBC: 3.57 MIL/uL — ABNORMAL LOW (ref 4.22–5.81)
RDW: 15.1 % (ref 11.5–15.5)
WBC Count: 8.2 10*3/uL (ref 4.0–10.5)
nRBC: 0 % (ref 0.0–0.2)

## 2022-10-31 LAB — CMP (CANCER CENTER ONLY)
ALT: 11 U/L (ref 0–44)
AST: 21 U/L (ref 15–41)
Albumin: 3.7 g/dL (ref 3.5–5.0)
Alkaline Phosphatase: 124 U/L (ref 38–126)
Anion gap: 9 (ref 5–15)
BUN: 29 mg/dL — ABNORMAL HIGH (ref 8–23)
CO2: 25 mmol/L (ref 22–32)
Calcium: 9.3 mg/dL (ref 8.9–10.3)
Chloride: 103 mmol/L (ref 98–111)
Creatinine: 1.49 mg/dL — ABNORMAL HIGH (ref 0.61–1.24)
GFR, Estimated: 48 mL/min — ABNORMAL LOW (ref >60)
Glucose, Bld: 202 mg/dL — ABNORMAL HIGH (ref 70–99)
Potassium: 4 mmol/L (ref 3.5–5.1)
Sodium: 137 mmol/L (ref 135–145)
Total Bilirubin: 0.6 mg/dL (ref 0.3–1.2)
Total Protein: 7.1 g/dL (ref 6.5–8.1)

## 2022-10-31 NOTE — Progress Notes (Signed)
[facility] OFFICE PROGRESS NOTE   Diagnosis: Pancreas Cancer  INTERVAL HISTORY: [name] returns as scheduled.  He feels well.  No abdominal pain.  He stays in the house most of the time.  He uses a Teaching laboratory technician. He has chronic erythema of the right lower leg.  He has persistent neuropathy symptoms in the right hand and feet.  The feet are cold at night.   Objective:  Vital signs in last 24 hours:  Blood pressure (!) 147/70, pulse 80, temperature 98.1 F (36.7 C), temperature source Oral, resp. rate 18, height '5\' 11"'$  (1.803 m), weight 260 lb (117.9 kg), SpO2 100 %.    Lymphatics: No cervical, supraclavicular, axillary, or inguinal nodes Resp: Lungs clear bilaterally Cardio: Regular rate and rhythm GI: No apparent ascites, nontender, no hepatosplenomegaly, no mass Vascular: Mild edema and chronic stasis change at the right lower leg    Portacath/PICC-without erythema  Lab Results:  Lab Results  Component Value Date   WBC 8.2 10/31/2022   HGB 11.4 (L) 10/31/2022   HCT 34.6 (L) 10/31/2022   MCV 96.9 10/31/2022   PLT 237 10/31/2022   NEUTROABS 3.9 10/31/2022    CMP  Lab Results  Component Value Date   NA 137 10/31/2022   K 4.0 10/31/2022   CL 103 10/31/2022   CO2 25 10/31/2022   GLUCOSE 202 (H) 10/31/2022   BUN 29 (H) 10/31/2022   CREATININE 1.49 (H) 10/31/2022   CALCIUM 9.3 10/31/2022   PROT 7.1 10/31/2022   ALBUMIN 3.7 10/31/2022   AST 21 10/31/2022   ALT 11 10/31/2022   ALKPHOS 124 10/31/2022   BILITOT 0.6 10/31/2022   GFRNONAA 48 (L) 10/31/2022   GFRAA 49 (L) 10/10/2020    Lab Results  Component Value Date   [id number] 22 09/05/2022    Medications: I have reviewed the patient's current medications.   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  No pancreatic ductal dilatation identified.  No definite signs of metastatic disease in the chest, abdomen or pelvis. Cycle 1 gemcitabine/Abraxane 03/01/2021 Cycle 2 gemcitabine/Abraxane 03/14/2021 Cycle 3 gemcitabine/Abraxane 03/28/2021 Cycle 4 gemcitabine/Abraxane 04/17/2021 Cycle 5 gemcitabine/Abraxane 05/01/2021, Zofran/Decadron added Cycle 6 gemcitabine/Abraxane 05/15/2021 CT pancreas protocol 05/27/2021-unchanged 8 mm exophytic low-attenuation lesion at the posterior body of the pancreas 07/01/2021-distal pancreatectomy/splenectomy, 0.8 cm poorly differentiated adenocarcinoma, treatment response score-2, largest single foci of remaining tumor 0.2 cm, negative resection margins, 0/6 lymph nodes,ypT1bypNo, PanIN-1b Cycle 7 gemcitabine/Abraxane 08/14/2021 Cycle 8 gemcitabine/Abraxane 09/04/2021 Cycle 9 gemcitabine/Abraxane 09/18/2021 Cycle 10 gemcitabine/Abraxane 10/01/2021 Cycle 11 gemcitabine/Abraxane 10/16/2021 Cycle 12 gemcitabine/Abraxane 10/29/2021, Abraxane held secondary to neuropathy CT abdomen/pelvis 02/06/2022-acute interstitial edematous pancreatitis with small 1.6 cm acute peripancreatic fluid collection within the superior aspect of the pancreatic head; interval postoperative changes of distal pancreatectomy and splenectomy; stable benign left adrenal adenoma Diabetes Coronary artery disease Prostate cancer 2013-treated with radiation at [facility] Hypertension Sleep apnea 7.  Coronary artery bypass surgery 2004 8.  Hospital admission 03/04/2021-syncope, A. Fib-started on Eliquis 9.  Mild  thrombocytopenia following cycle  1 gemcitabine/Abraxane-resolved 10.  Anemia 11.  Admission 09/22/2021 with a right metatarsal ulcer and right leg cellulitis 12.  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  Renal insufficiency    Disposition: [name] is in clinical remission from pancreas cancer.  The CA 19-9 was normal in September.  He would like to keep the Port-A-Cath in place.  He will return for a Port-A-Cath flush in 6 weeks and an office visit with a CA 19-9 in 3 months.  I encouraged him to increase his activity level.  Betsy Coder, MD  10/31/2022  11:16 AM

## 2022-11-05 ENCOUNTER — Ambulatory Visit: Payer: Medicare Other | Admitting: Orthopaedic Surgery

## 2022-11-18 DIAGNOSIS — N3000 Acute cystitis without hematuria: Secondary | ICD-10-CM | POA: Diagnosis not present

## 2022-11-21 DIAGNOSIS — N3000 Acute cystitis without hematuria: Secondary | ICD-10-CM | POA: Diagnosis not present

## 2022-11-21 DIAGNOSIS — R3915 Urgency of urination: Secondary | ICD-10-CM | POA: Diagnosis not present

## 2022-11-21 DIAGNOSIS — R310 Gross hematuria: Secondary | ICD-10-CM | POA: Diagnosis not present

## 2022-11-21 DIAGNOSIS — R300 Dysuria: Secondary | ICD-10-CM | POA: Diagnosis not present

## 2022-12-04 ENCOUNTER — Other Ambulatory Visit: Payer: Self-pay

## 2022-12-04 DIAGNOSIS — C251 Malignant neoplasm of body of pancreas: Secondary | ICD-10-CM

## 2022-12-09 NOTE — Progress Notes (Unsigned)
HPI: BillBrode Isauro [name] is a 76 y.o. male, who is here today for chronic disease management.  Last seen on 07/07/2022.  Hypertension:  Atrial fib on Eliquis 5 mg bid. Medications: Amlodipine 5 mg daily and Valsartan 160 mg daily 1/2 tab daily. BP readings at home:Most 120-130's/70's. Side effects: None Negative for unusual or severe headache, visual changes, exertional chest pain, dyspnea,  new focal weakness, or worsening edema. CKD III: He has not noted gross hematuria, decreased urine output,or foam in urine.  Lab Results  Component Value Date   CREATININE 1.49 (H) 10/31/2022   BUN 29 (H) 10/31/2022   NA 137 10/31/2022   K 4.0 10/31/2022   CL 103 10/31/2022   CO2 25 10/31/2022    Diabetes Mellitus II: Dx'ed in 2015. - Checking BG at home: < 200's most, a few fasting BS's 60-50's, most < 100. - Medications: Lantus 45 units daily, Humalog 5-8 units before lunch and at bedtime. - Diet: No significant changes. - Exercise: Not regularly, unstable gait and hx of chronic pain. - eye exam: due - foot exam: 01/06/22 - Negative for symptoms of hypoglycemia, polyuria, polydipsia,foot ulcers/trauma LE's numbness and tingling, stable for years.  Lab Results  Component Value Date   HGBA1C 6.8 12/10/2022   Lab Results  Component Value Date   MICROALBUR 7.8 10/10/2020    Review of Systems See other pertinent positives and negatives in HPI.  Current Outpatient Medications on File Prior to Visit  Medication Sig Dispense Refill   acetaminophen (TYLENOL) 500 MG tablet Take 1,000 mg by mouth 2 (two) times daily.     apixaban (ELIQUIS) 5 MG TABS tablet Take 5 mg by mouth 2 (two) times daily.     atorvastatin (LIPITOR) 80 MG tablet Take 40 mg by mouth at bedtime.     calcium-vitamin D (OSCAL WITH D) 500-200 MG-UNIT tablet Take 1 tablet by mouth 2 (two) times daily.     clotrimazole-betamethasone (LOTRISONE) cream Apply 1 application topically daily as needed. 45 g 2    cycloSPORINE (RESTASIS) 0.05 % ophthalmic emulsion Place 1 drop into both eyes 2 (two) times daily as needed (dry eyes).     ferrous sulfate 325 (65 FE) MG tablet Take 1 tablet (325 mg total) by mouth daily with breakfast. 90 tablet 2   FLUoxetine (PROZAC) 20 MG capsule Take 40 mg by mouth daily.     fluticasone (FLONASE) 50 MCG/ACT nasal spray Place 1 spray into both nostrils daily.     glucose blood (ONETOUCH VERIO) test strip Use to test blood sugars 1-2 times daily. 200 each 12   hydrALAZINE (APRESOLINE) 25 MG tablet Take 1 tablet (25 mg total) by mouth 3 (three) times daily. 270 tablet 1   insulin glargine (LANTUS) 100 UNIT/ML injection Inject 0.45 mLs (45 Units total) into the skin daily. 10 mL 3   insulin lispro (HUMALOG) 100 UNIT/ML injection Per sliding scale. 10 mL 11   lidocaine-prilocaine (EMLA) cream Apply 1 application. topically as needed.     loratadine (CLARITIN) 10 MG tablet Take 10 mg by mouth daily as needed.     Multiple Vitamin (MULTI-VITAMINS) TABS Take 1 tablet by mouth daily.     nitroGLYCERIN (NITROSTAT) 0.3 MG SL tablet Place 0.3 mg under the tongue every 5 (five) minutes as needed for chest pain.     nystatin powder Apply 1 application topically 3 (three) times daily. 15 g 2   pantoprazole (PROTONIX) 40 MG tablet Take 1 tablet (40  mg total) by mouth daily. 90 tablet 3   Probiotic Product (PROBIOTIC ADVANCED PO) Take 1 capsule by mouth at bedtime.     tamsulosin (FLOMAX) 0.4 MG CAPS capsule Take 0.8 mg by mouth at bedtime.     traZODone (DESYREL) 100 MG tablet Take 100 mg by mouth at bedtime.     valsartan (DIOVAN) 160 MG tablet Take 0.5 tablets (80 mg total) by mouth daily. 90 tablet 3   vitamin B-12 (CYANOCOBALAMIN) 1000 MCG tablet Take 1,000 mcg by mouth daily.     Wheat Dextrin (BENEFIBER DRINK MIX PO) Take by mouth daily.     amLODipine (NORVASC) 5 MG tablet Take 1 tablet (5 mg total) by mouth daily. 90 tablet 3   furosemide (LASIX) 20 MG tablet Take 1 tablet (20  mg total) by mouth daily. 90 tablet 3   No current facility-administered medications on file prior to visit.    Past Medical History:  Diagnosis Date   A-fib ([place]) 03/04/2021   Anemia    Arthritis    Cancer (HCC)    prostate   Chronic kidney disease    blood in urine    Constipation    Coronary artery disease    Depression    Diabetes mellitus without complication ([place])    Difficult intubation    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 it was hard to get the tube down his throat   Dyspnea    Family history of breast cancer    Fatty liver    Fatty liver    Frequent headaches    GERD (gastroesophageal reflux disease)    History of chicken pox    History of fainting spells of unknown cause    History of prostate cancer    Hyperlipidemia    Hypertension    Myocardial infarction ([place])    Peripheral neuropathy    Pneumonia    Prostate cancer (HCC)    PTSD (post-traumatic stress disorder)    Sleep apnea    uses Cpap   Allergies  Allergen Reactions   Keflex [Cephalexin] Other (See Comments)    Hallucinations?   Lisinopril Other (See Comments)    Unknown   Terazosin Other (See Comments)    Unknown    Social History   Socioeconomic History   Marital status: Married    Spouse name: Not on file   Number of children: 2   Years of education: Not on file   Highest education level: Not on file  Occupational History   Occupation: retired  Tobacco Use   Smoking status: Former    Types: Cigarettes    Quit date: 12/14/1975    Years since quitting: 47.0   Smokeless tobacco: Never  Vaping Use   Vaping Use: Not on file  Substance and Sexual Activity   Alcohol use: Not Currently   Drug use: Never   Sexual activity: Not Currently  Other Topics Concern   Not on file  Social History Narrative   ** Merged History Encounter **       Social Determinants of Health   Financial Resource Strain: Low Risk  (12/06/2021)   Overall Financial Resource Strain (CARDIA)    Difficulty of Paying  Living Expenses: Not hard at all  Food Insecurity: No [place] (12/06/2021)   Hunger Vital Sign    Worried About Running Out of Food in the Last Year: Never true    Ran Out of Food in the Last Year: Never true  Transportation Needs: No Transportation Needs (  12/06/2021)   PRAPARE - Hydrologist (Medical): No    Lack of Transportation (Non-Medical): No  Physical Activity: Insufficiently Active (12/06/2021)   Exercise Vital Sign    Days of Exercise per Week: 2 days    Minutes of Exercise per Session: 60 min  Stress: No Stress Concern Present (12/06/2021)   [place]    Feeling of Stress : Not at all  Social Connections: Moderately Isolated (12/06/2021)   Social Connection and Isolation Panel [NHANES]    Frequency of Communication with Friends and Family: More than three times a week    Frequency of Social Gatherings with Friends and Family: More than three times a week    Attends Religious Services: Never    Marine scientist or Organizations: No    Attends Archivist Meetings: Never    Marital Status: Married    Vitals:   12/10/22 0954  BP: 124/72  Pulse: 99  Temp: 99 F (37.2 C)  SpO2: 97%   Body mass index is 36.4 kg/m.  Physical Exam Vitals and nursing note reviewed.  Constitutional:      General: He is not in acute distress.    Appearance: He is well-developed.  HENT:     Head: Normocephalic and atraumatic.  Eyes:     Conjunctiva/sclera: Conjunctivae normal.  Cardiovascular:     Rate and Rhythm: Normal rate and regular rhythm.     Heart sounds: No murmur heard. Pulmonary:     Effort: Pulmonary effort is normal. No respiratory distress.     Breath sounds: Normal breath sounds.  Abdominal:     Palpations: Abdomen is soft. There is no mass.     Tenderness: There is no abdominal tenderness.  Musculoskeletal:     Right lower leg: 1+ Pitting Edema  present.     Left lower leg: 1+ Pitting Edema present.       Legs:     Comments: Hypertrophic, long toenails.  Lymphadenopathy:     Cervical: No cervical adenopathy.  Skin:    General: Skin is warm.     Findings: No erythema or rash.       Neurological:     Mental Status: He is alert and oriented to person, place, and time.     Cranial Nerves: No cranial nerve deficit.     Comments: Unstable gait, assisted by a walker.  Psychiatric:     Comments: Well groomed, good eye contact.    ASSESSMENT AND PLAN:  [name] was seen today for follow-up.  Diagnoses and all orders for this visit:  Type 2 diabetes mellitus with stage 3 chronic kidney disease, with long-term current use of insulin, unspecified whether stage 3a or 3b CKD ([place]) -     POC HgB A1c  Essential hypertension    Orders Placed This Encounter  Procedures   POC HgB A1c    No problem-specific Assessment & Plan notes found for this encounter.   No follow-ups on file.  Tanyah Debruyne G. [name], MD  [facility]. [place] office.

## 2022-12-10 ENCOUNTER — Ambulatory Visit (INDEPENDENT_AMBULATORY_CARE_PROVIDER_SITE_OTHER): Payer: Medicare Other | Admitting: Family Medicine

## 2022-12-10 ENCOUNTER — Encounter: Payer: Self-pay | Admitting: Family Medicine

## 2022-12-10 VITALS — BP 124/72 | HR 99 | Temp 99.0°F | Resp 16 | Ht 71.0 in | Wt 261.0 lb

## 2022-12-10 DIAGNOSIS — I119 Hypertensive heart disease without heart failure: Secondary | ICD-10-CM

## 2022-12-10 DIAGNOSIS — N1831 Chronic kidney disease, stage 3a: Secondary | ICD-10-CM | POA: Diagnosis not present

## 2022-12-10 DIAGNOSIS — Z794 Long term (current) use of insulin: Secondary | ICD-10-CM | POA: Diagnosis not present

## 2022-12-10 DIAGNOSIS — I2510 Atherosclerotic heart disease of native coronary artery without angina pectoris: Secondary | ICD-10-CM

## 2022-12-10 DIAGNOSIS — N183 Chronic kidney disease, stage 3 unspecified: Secondary | ICD-10-CM

## 2022-12-10 DIAGNOSIS — E1122 Type 2 diabetes mellitus with diabetic chronic kidney disease: Secondary | ICD-10-CM

## 2022-12-10 DIAGNOSIS — I10 Essential (primary) hypertension: Secondary | ICD-10-CM

## 2022-12-10 DIAGNOSIS — M79671 Pain in right foot: Secondary | ICD-10-CM | POA: Diagnosis not present

## 2022-12-10 LAB — POCT GLYCOSYLATED HEMOGLOBIN (HGB A1C): HbA1c, POC (controlled diabetic range): 6.8 % (ref 0.0–7.0)

## 2022-12-10 NOTE — Assessment & Plan Note (Signed)
Problem is well controlled, A1C stable at 6.8. He has had a few hypoglycemic events, he agrees with trying decreasing Lantus to 40 U daily from 45 U. Continue Humalog per sliding scale. Annual eye exam, periodic dental and foot care to continue. F/U in 5-6 months.

## 2022-12-10 NOTE — Assessment & Plan Note (Signed)
Problem has been otherwise stable. Referral to nephrologist placed.

## 2022-12-10 NOTE — Patient Instructions (Signed)
A few things to remember from today's visit:  Type 2 diabetes mellitus with stage 3 chronic kidney disease, with long-term current use of insulin, unspecified whether stage 3a or 3b CKD ([place]) - Plan: POC HgB A1c  Essential hypertension  Stage 3a chronic kidney disease ([place]) - Plan: Ambulatory referral to Nephrology  Foot pain, right - Plan: Ambulatory referral to Podiatry  No changes today. If you continue having fasting sugars in the 60's we will need to adjust Lantus. Instead grapes and crackers at night try yogurt.  If you need refills for medications you take chronically, please call your pharmacy. Do not use My Chart to request refills or for acute issues that need immediate attention. If you send a my chart message, it may take a few days to be addressed, specially if I am not in the office.  Please be sure medication list is accurate. If a new problem present, please set up appointment sooner than planned today.

## 2022-12-11 ENCOUNTER — Encounter: Payer: Self-pay | Admitting: Oncology

## 2022-12-11 NOTE — Assessment & Plan Note (Signed)
BP adequately controlled. Continue hydralazine 25 mg 3 times daily, amlodipine 5 mg daily, and valsartan 160 mg 1/2 tablet daily as well as low salt diet. Monitoring BP regularly.

## 2022-12-11 NOTE — Assessment & Plan Note (Signed)
We discussed possible causes, OA and neuropathy most likely contributing factors. Podiatry referral placed. Continue appropriate foot care.

## 2022-12-12 ENCOUNTER — Inpatient Hospital Stay: Payer: Medicare Other

## 2022-12-12 ENCOUNTER — Inpatient Hospital Stay: Payer: Medicare Other | Attending: Nurse Practitioner

## 2022-12-12 DIAGNOSIS — C251 Malignant neoplasm of body of pancreas: Secondary | ICD-10-CM | POA: Insufficient documentation

## 2022-12-12 LAB — CBC WITH DIFFERENTIAL (CANCER CENTER ONLY)
Abs Immature Granulocytes: 0.07 10*3/uL (ref 0.00–0.07)
Basophils Absolute: 0.1 10*3/uL (ref 0.0–0.1)
Basophils Relative: 1 %
Eosinophils Absolute: 0.1 10*3/uL (ref 0.0–0.5)
Eosinophils Relative: 1 %
HCT: 36.5 % — ABNORMAL LOW (ref 39.0–52.0)
Hemoglobin: 12 g/dL — ABNORMAL LOW (ref 13.0–17.0)
Immature Granulocytes: 1 %
Lymphocytes Relative: 28 %
Lymphs Abs: 2.5 10*3/uL (ref 0.7–4.0)
MCH: 31.5 pg (ref 26.0–34.0)
MCHC: 32.9 g/dL (ref 30.0–36.0)
MCV: 95.8 fL (ref 80.0–100.0)
Monocytes Absolute: 0.7 10*3/uL (ref 0.1–1.0)
Monocytes Relative: 8 %
Neutro Abs: 5.5 10*3/uL (ref 1.7–7.7)
Neutrophils Relative %: 61 %
Platelet Count: 248 10*3/uL (ref 150–400)
RBC: 3.81 MIL/uL — ABNORMAL LOW (ref 4.22–5.81)
RDW: 14.4 % (ref 11.5–15.5)
WBC Count: 8.9 10*3/uL (ref 4.0–10.5)
nRBC: 0 % (ref 0.0–0.2)

## 2022-12-12 LAB — CMP (CANCER CENTER ONLY)
ALT: 11 U/L (ref 0–44)
AST: 22 U/L (ref 15–41)
Albumin: 3.7 g/dL (ref 3.5–5.0)
Alkaline Phosphatase: 113 U/L (ref 38–126)
Anion gap: 10 (ref 5–15)
BUN: 29 mg/dL — ABNORMAL HIGH (ref 8–23)
CO2: 26 mmol/L (ref 22–32)
Calcium: 9.1 mg/dL (ref 8.9–10.3)
Chloride: 102 mmol/L (ref 98–111)
Creatinine: 1.41 mg/dL — ABNORMAL HIGH (ref 0.61–1.24)
GFR, Estimated: 51 mL/min — ABNORMAL LOW (ref >60)
Glucose, Bld: 250 mg/dL — ABNORMAL HIGH (ref 70–99)
Potassium: 4.1 mmol/L (ref 3.5–5.1)
Sodium: 138 mmol/L (ref 135–145)
Total Bilirubin: 0.7 mg/dL (ref 0.3–1.2)
Total Protein: 7.1 g/dL (ref 6.5–8.1)

## 2022-12-16 ENCOUNTER — Ambulatory Visit (INDEPENDENT_AMBULATORY_CARE_PROVIDER_SITE_OTHER): Payer: Medicare Other

## 2022-12-16 VITALS — Ht 71.0 in | Wt 252.0 lb

## 2022-12-16 DIAGNOSIS — Z0000 Encounter for general adult medical examination without abnormal findings: Secondary | ICD-10-CM | POA: Diagnosis not present

## 2022-12-16 NOTE — Progress Notes (Signed)
Subjective:   [name] is a 77 y.o. male who presents for Medicare Annual/Subsequent preventive examination.  Review of Systems    Virtual Visit via Telephone Note  I connected with  [name] on 12/16/22 at  1:30 PM EST by telephone and verified that I am speaking with the correct person using two identifiers.  Location: Patient: Home Provider: Office Persons participating in the virtual visit: patient/Nurse Health Advisor   I discussed the limitations, risks, security and privacy concerns of performing an evaluation and management service by telephone and the availability of in person appointments. The patient expressed understanding and agreed to proceed.  Interactive audio and video telecommunications were attempted between this nurse and patient, however failed, due to patient having technical difficulties OR patient did not have access to video capability.  We continued and completed visit with audio only.  Some vital signs may be absent or patient reported.   Criselda Peaches, LPN  Cardiac Risk Factors include: advanced age (>57mn, >>69women);diabetes mellitus;male gender;hypertension     Objective:    Today's Vitals   12/16/22 1339  Weight: 252 lb (114.3 kg)  Height: 5' 11" (1.803 m)   Body mass index is 35.15 kg/m.     12/16/2022    1:50 PM 10/31/2022   11:02 AM 09/05/2022   10:49 AM 02/18/2022   10:33 AM 02/06/2022   11:48 AM 12/06/2021   10:03 AM 11/26/2021   10:36 AM  Advanced Directives  Does Patient Have a Medical Advance Directive? Yes Yes Yes Yes No Yes Yes  Type of AParamed[place]of [place] will [place]  Living will;Healthcare Power of [place] will Living will;Healthcare Power of [place] of facility DNR (pink MOST or yellow form)  Does patient want to make changes to medical advance directive? No - Patient declined No - Patient  declined No - Patient declined No - Patient declined  No - Patient declined No - Patient declined  Copy of [place] Chart? Yes - validated most recent copy scanned in chart (See row information) Yes - validated most recent copy scanned in chart (See row information)    No - copy requested Yes - validated most recent copy scanned in chart (See row information)  Would patient like information on creating a medical advance directive?   No - Patient declined No - Patient declined No - Patient declined  No - Patient declined    Current Medications (verified) Outpatient Encounter Medications as of 12/16/2022  Medication Sig   acetaminophen (TYLENOL) 500 MG tablet Take 1,000 mg by mouth 2 (two) times daily.   amLODipine (NORVASC) 5 MG tablet Take 1 tablet (5 mg total) by mouth daily.   apixaban (ELIQUIS) 5 MG TABS tablet Take 5 mg by mouth 2 (two) times daily.   atorvastatin (LIPITOR) 80 MG tablet Take 40 mg by mouth at bedtime.   calcium-vitamin D (OSCAL WITH D) 500-200 MG-UNIT tablet Take 1 tablet by mouth 2 (two) times daily.   clotrimazole-betamethasone (LOTRISONE) cream Apply 1 application topically daily as needed.   cycloSPORINE (RESTASIS) 0.05 % ophthalmic emulsion Place 1 drop into both eyes 2 (two) times daily as needed (dry eyes).   ferrous sulfate 325 (65 FE) MG tablet Take 1 tablet (325 mg total) by mouth daily with breakfast.   FLUoxetine (PROZAC) 20 MG capsule Take 40 mg by mouth daily.   fluticasone (FLONASE) 50 MCG/ACT nasal  spray Place 1 spray into both nostrils daily.   furosemide (LASIX) 20 MG tablet Take 1 tablet (20 mg total) by mouth daily.   glucose blood (ONETOUCH VERIO) test strip Use to test blood sugars 1-2 times daily.   hydrALAZINE (APRESOLINE) 25 MG tablet Take 1 tablet (25 mg total) by mouth 3 (three) times daily.   insulin glargine (LANTUS) 100 UNIT/ML injection Inject 0.45 mLs (45 Units total) into the skin daily.   insulin lispro (HUMALOG) 100  UNIT/ML injection Per sliding scale.   lidocaine-prilocaine (EMLA) cream Apply 1 application. topically as needed.   loratadine (CLARITIN) 10 MG tablet Take 10 mg by mouth daily as needed.   Multiple Vitamin (MULTI-VITAMINS) TABS Take 1 tablet by mouth daily.   nitroGLYCERIN (NITROSTAT) 0.3 MG SL tablet Place 0.3 mg under the tongue every 5 (five) minutes as needed for chest pain.   nystatin powder Apply 1 application topically 3 (three) times daily.   pantoprazole (PROTONIX) 40 MG tablet Take 1 tablet (40 mg total) by mouth daily.   Probiotic Product (PROBIOTIC ADVANCED PO) Take 1 capsule by mouth at bedtime.   tamsulosin (FLOMAX) 0.4 MG CAPS capsule Take 0.8 mg by mouth at bedtime.   traZODone (DESYREL) 100 MG tablet Take 100 mg by mouth at bedtime.   valsartan (DIOVAN) 160 MG tablet Take 0.5 tablets (80 mg total) by mouth daily.   vitamin B-12 (CYANOCOBALAMIN) 1000 MCG tablet Take 1,000 mcg by mouth daily.   Wheat Dextrin (BENEFIBER DRINK MIX PO) Take by mouth daily.   No facility-administered encounter medications on file as of 12/16/2022.    Allergies (verified) Keflex [cephalexin], Lisinopril, and Terazosin   History: Past Medical History:  Diagnosis Date   A-fib ([place]) 03/04/2021   Anemia    Arthritis    Cancer (HCC)    prostate   Chronic kidney disease    blood in urine    Constipation    Coronary artery disease    Depression    Diabetes mellitus without complication ([place])    Difficult intubation    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 it was hard to get the tube down his throat   Dyspnea    Family history of breast cancer    Fatty liver    Fatty liver    Frequent headaches    GERD (gastroesophageal reflux disease)    History of chicken pox    History of fainting spells of unknown cause    History of prostate cancer    Hyperlipidemia    Hypertension    Myocardial infarction [facility])    Peripheral neuropathy    Pneumonia    Prostate cancer ([place])    PTSD (post-traumatic stress  disorder)    Sleep apnea    uses Cpap   Past Surgical History:  Procedure Laterality Date   APPENDECTOMY     BIOPSY  01/15/2021   Procedure: BIOPSY;  Surgeon: Irving Copas., MD;  Location: [place];  Service: Gastroenterology;;   CARDIAC CATHETERIZATION  06/26/2017   CARDIAC SURGERY     Triple Bypass   CHOLECYSTECTOMY  2010   COLONOSCOPY     CORONARY ARTERY BYPASS GRAFT  2004   ESOPHAGOGASTRODUODENOSCOPY (EGD) WITH PROPOFOL N/A 01/15/2021   Procedure: ESOPHAGOGASTRODUODENOSCOPY (EGD) WITH PROPOFOL;  Surgeon: Irving Copas., MD;  [facility] ENDOSCOPY;  Service: Gastroenterology;  Laterality: N/A;   EUS N/A 01/15/2021   Procedure: UPPER ENDOSCOPIC ULTRASOUND (EUS) RADIAL;  Surgeon: Irving Copas., MD;  Location: [place];  Service: Gastroenterology;  Laterality: N/A;   EYE SURGERY Bilateral    cataract removal   FINE NEEDLE ASPIRATION  01/15/2021   Procedure: FINE NEEDLE ASPIRATION (FNA) LINEAR;  Surgeon: Irving Copas., MD;  Location: [place];  Service: Gastroenterology;;   LAPAROSCOPY N/A 07/01/2021   Procedure: STAGING LAPAROSCOPY;  Surgeon: Dwan Bolt, MD;  Location: [place];  Service: General;  Laterality: N/A;   PORTACATH PLACEMENT Right 02/21/2021   Procedure: INSERTION PORT-A-CATH;  Surgeon: Dwan Bolt, MD;  Location: [place];  Service: General;  Laterality: Right;   SPLENECTOMY, 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   Procedure: SPLENECTOMY;  Surgeon: Dwan Bolt, MD;  Location: [place];  Service: General;  Laterality: N/A;   TONSILLECTOMY  1958   Family History  Problem Relation Age of Onset   Arthritis Mother    Heart disease Mother    Hyperlipidemia Mother    Hypertension Mother    Heart attack Mother    Breast cancer Sister        dx 17s   Heart attack Brother    Heart disease Brother    Depression Daughter    Arthritis Sister    Cancer Sister        unknown type, dx 41s   Cancer Brother    Learning disabilities  Brother    Alcohol abuse Brother    COPD Brother    Heart attack Brother    Heart disease Brother    Social History   Socioeconomic History   Marital status: Married    Spouse name: Not on file   Number of children: 2   Years of education: Not on file   Highest education level: Not on file  Occupational History   Occupation: retired  Tobacco Use   Smoking status: Former    Types: Cigarettes    Quit date: 12/14/1975    Years since quitting: 47.0   Smokeless tobacco: Never  Vaping Use   Vaping Use: Not on file  Substance and Sexual Activity   Alcohol use: Not Currently   Drug use: Never   Sexual activity: Not Currently  Other Topics Concern   Not on file  Social History Narrative   ** Merged History Encounter **       Social Determinants of Health   Financial Resource Strain: Low Risk  (12/16/2022)   Overall Financial Resource Strain (CARDIA)    Difficulty of Paying Living Expenses: Not hard at all  Food Insecurity: No Food Insecurity (12/16/2022)   Hunger Vital Sign    Worried About Running Out of Food in the Last Year: Never true    [place] in the Last Year: Never true  Transportation Needs: No Transportation Needs (12/16/2022)   PRAPARE - Hydrologist (Medical): No    Lack of Transportation (Non-Medical): No  Physical Activity: Insufficiently Active (12/16/2022)   Exercise Vital Sign    Days of Exercise per Week: 2 days    Minutes of Exercise per Session: 60 min  Stress: No Stress Concern Present (12/16/2022)   [place]    Feeling of Stress : Not at all  Social Connections: [place] (12/16/2022)   Social Connection and Isolation Panel [NHANES]    Frequency of Communication with Friends and Family: More than three times a week    Frequency of Social Gatherings with Friends and Family: More than three times a week    Attends Religious Services: More than  4  times per year    Active Member of Clubs or Organizations: Yes    Attends Music therapist: More than 4 times per year    Marital Status: Married    Tobacco Counseling Counseling given: Not Answered   Clinical Intake:  Pre-visit preparation completed: No  Pain : No/denies pain Nutrition Risk Assessment:  Has the patient had any N/V/D within the last 2 months?  No  Does the patient have any non-healing wounds?  No  Has the patient had any unintentional weight loss or weight gain?  No   Diabetes:  Is the patient diabetic?  Yes  If diabetic, was a CBG obtained today?  Yes CBG 117 Taken by patient Did the patient bring in their glucometer from home?  No  How often do you monitor your CBG's? 3 X Daily.   Financial Strains and Diabetes Management:  Are you having any financial strains with the device, your supplies or your medication? No .  Does the patient want to be seen by Chronic Care Management for management of their diabetes?  No  Would the patient like to be referred to a Nutritionist or for Diabetic Management?  No   Diabetic Exams:  Diabetic Eye Exam: Completed No. Overdue for diabetic eye exam. Pt has been advised about the importance in completing this exam. A referral has been placed today. Message sent to referral coordinator for scheduling purposes. Advised pt to expect a call from office referred to regarding appt.  Diabetic Foot Exam: Completed No. Pt has been advised about the importance in completing this exam. Pt is scheduled for diabetic foot exam on Followed by PCP.      BMI - recorded: 35.15 Nutritional Status: BMI > 30  Obese Nutritional Risks: None Diabetes: Yes CBG done?: Yes CBG resulted in Enter/ Edit results?: Yes (CBG 117 Taken by patient)  How often do you need to have someone help you when you read instructions, pamphlets, or other written materials from your doctor or pharmacy?: 1 - Never  Diabetic?  Yes  Interpreter Needed?:  No  Information entered by :: Rolene Arbour LPN   Activities of Daily Living    12/16/2022    1:47 PM 02/07/2022    3:00 AM  In your present state of health, do you have any difficulty performing the following activities:  Hearing? 0 0  Vision? 0 0  Difficulty concentrating or making decisions? 0 0  Walking or climbing stairs? 1 0  Comment Uses walker cane   Dressing or bathing? 0 0  Doing errands, shopping? 0 0  Preparing Food and eating ? N   Using the Toilet? N   In the past six months, have you accidently leaked urine? Y   Comment Followed by Urologist   Do you have problems with loss of bowel control? N   Managing your Medications? N   Managing your Finances? N   Housekeeping or managing your Housekeeping? N     Patient Care Team: [name], [name] G, MD as PCP - General (Family Medicine) Buford Dresser, MD as PCP - Cardiology (Cardiology) Ladell Pier, MD as Consulting Physician (Oncology)  Indicate any recent Medical Services you may have received from other than [facility] providers in the past year (date may be approximate).     Assessment:   This is a routine wellness examination for Nivek.  Hearing/Vision screen Hearing Screening - Comments:: Denies hearing difficulties   Vision Screening - Comments:: Wears rx glasses -  up to date [facility] exams with  [facility]  Dietary issues and exercise activities discussed: Current Exercise Habits: Home exercise routine, Type of exercise: walking, Time (Minutes): 60, Frequency (Times/Week): 2, Weekly Exercise (Minutes/Week): 120, Intensity: Moderate, Exercise limited by: None identified   Goals Addressed               This Visit's Progress     Stay Healthy (pt-stated)         Depression Screen    12/16/2022    1:46 PM 12/10/2022   10:02 AM 07/07/2022   10:32 AM 04/08/2022    2:20 PM 01/06/2022   11:07 AM 12/06/2021    9:55 AM 06/04/2021   11:49 AM  PHQ 2/9 Scores  PHQ - 2 Score 0 0 0 2 3  0 2  PHQ- 9 Score 0 5 0 _0 Fall Risk    12/16/2022    1:49 PM 12/10/2022   10:02 AM 07/07/2022   10:32 AM 12/06/2021    9:59 AM 10/11/2020    6:35 PM  Fall Risk   Falls in the past year? 1 1 0 1 0  Number falls in past yr: 1 1 0 0 0  Injury with Fall? 0 0 0 0 0  Risk for fall due to : Orthopedic patient History of fall(s) History of fall(s);Impaired balance/gait  Impaired balance/gait;Orthopedic patient  Follow up  Falls evaluation completed Falls evaluation completed  Education provided    [place]:  Any stairs in or around the home? Yes  If so, are there any without handrails? No  Home free of loose throw rugs in walkways, pet beds, electrical cords, etc? Yes  Adequate lighting in your home to reduce risk of falls? Yes   ASSISTIVE DEVICES UTILIZED TO PREVENT FALLS:  Life alert? No  Use of a cane, walker or w/c? Yes  Grab bars in the bathroom? Yes  Shower chair or bench in shower? Yes  Elevated toilet seat or a handicapped toilet? Yes   TIMED UP AND GO:  Was the test performed? No . Audio Visit  Cognitive Function:        12/16/2022    1:50 PM 12/06/2021   10:01 AM  6CIT Screen  What Year? 0 points 0 points  What month? 0 points 0 points  What time? 0 points 0 points  Count back from 20 0 points 0 points  Months in reverse 0 points 0 points  Repeat phrase 0 points 2 points  Total Score 0 points 2 points    Immunizations Immunization History  Administered Date(s) Administered   COVID-19, mRNA, vaccine(Comirnaty)12 years and older 10/07/2022   Fluad Quad(high Dose 65+) 08/31/2019, 10/10/2020, 09/26/2021   HIB (PRP-OMP) 02/08/2021   Influenza, High Dose Seasonal PF 10/07/2022   Influenza-Unspecified 10/17/1997, 09/14/2002, 11/24/2002, 09/29/2003, 10/15/2004, 10/15/2005, 09/28/2006, 11/09/2007, 10/06/2008, 10/17/2010, 10/16/2011, 10/15/2012, 09/13/2013, 09/14/2014, 10/09/2015, 10/29/2016, 10/08/2017, 07/15/2018,  08/31/2019   Meningococcal B, OMV 07/09/2021, 11/09/2021   Meningococcal Mcv4o 02/08/2021, 07/09/2021   PFIZER(Purple Top)SARS-COV-2 Vaccination 01/19/2020, 02/14/2020, 09/25/2020   Pfizer Covid-19 Vaccine Bivalent Booster 24yr & up 09/24/2021   Pneumococcal Conjugate-13 11/09/2013   Pneumococcal Polysaccharide-23 02/13/2005, 08/12/2012, 09/21/2019   Respiratory Syncytial Virus Vaccine,Recomb Aduvanted(Arexvy) 10/07/2022   Tdap 09/18/2011, 06/09/2018, 09/22/2021   Zoster Recombinat (Shingrix) 06/09/2018, 09/01/2018   Zoster, Live 01/12/2012    TDAP status: Up to date  Flu Vaccine status: Up to date  Pneumococcal vaccine status:  Up to date  Covid-19 vaccine status: Completed vaccines  Qualifies for Shingles Vaccine? Yes   Zostavax completed Yes   Shingrix Completed?: Yes  Screening Tests Health Maintenance  Topic Date Due   Meningococcal B Vaccine (1 of 4 - Increased Risk) 10/25/1956   Diabetic kidney evaluation - Urine ACR  10/10/2021   OPHTHALMOLOGY EXAM  12/18/2021   COVID-19 Vaccine (6 - 2023-24 season) 01/01/2023 (Originally 12/02/2022)   FOOT EXAM  01/06/2023   HEMOGLOBIN A1C  06/11/2023   Diabetic kidney evaluation - eGFR measurement  12/13/2023   Medicare Annual Wellness (AWV)  12/17/2023   DTaP/Tdap/Td (4 - Td or Tdap) 09/23/2031   Pneumonia Vaccine 66+ Years old  Completed   INFLUENZA VACCINE  Completed   Zoster Vaccines- Shingrix  Completed   HPV VACCINES  Aged Out   COLONOSCOPY (Pts 45-28yr Insurance coverage will need to be confirmed)  Discontinued   Hepatitis C Screening  Discontinued    Health Maintenance  Health Maintenance Due  Topic Date Due   Meningococcal B Vaccine (1 of 4 - Increased Risk) 10/25/1956   Diabetic kidney evaluation - Urine ACR  10/10/2021   OPHTHALMOLOGY EXAM  12/18/2021    Colorectal cancer screening: No longer required.   Lung Cancer Screening: (Low Dose CT Chest recommended if Age 79-80years, 30 pack-year currently  smoking OR have quit w/in 15years.) does not qualify.     Additional Screening:  Hepatitis C Screening: does not qualify; Completed   Vision Screening: Recommended annual ophthalmology exams for early detection of glaucoma and other disorders of the eye. Is the patient up to date with their annual eye exam?  Yes  Who is the provider or what is the name of the [facility] in which the patient attends annual [facility] exams[facility] provider, would they like to be referred to a provider to establish care? No .   Dental Screening: Recommended annual dental exams for proper oral hygiene  Community Resource Referral / Chronic Care Management:  CRR required this visit?  No   CCM required this visit?  No      Plan:     I have personally reviewed and noted the following in the patient's chart:   Medical and social history Use of alcohol, tobacco or illicit drugs  Current medications and supplements including opioid prescriptions. Patient is not currently taking opioid prescriptions. Functional ability and status Nutritional status Physical activity Advanced directives List of other physicians Hospitalizations, surgeries, and ER visits in previous 12 months Vitals Screenings to include cognitive, depression, and falls Referrals and appointments  In addition, I have reviewed and discussed with patient certain preventive protocols, quality metrics, and best practice recommendations. A written personalized care plan for preventive services as well as general preventive health recommendations were provided to patient.     BCriselda Peaches LPN   14/03/8184  Nurse Notes: Patient due Diabetic kidney evaluation- Urine ACR and Meningococcal B Vaccine (1 of 4-Increased Risk)

## 2022-12-16 NOTE — Patient Instructions (Addendum)
[name] , Thank you for taking time to come for your Medicare Wellness Visit. I appreciate your ongoing commitment to your health goals. Please review the following plan we discussed and let me know if I can assist you in the future.   These are the goals we discussed:  Goals       Stay Healthy (pt-stated)        This is a list of the screening recommended for you and due dates:  Health Maintenance  Topic Date Due   Meningococcal B Vaccine (1 of 4 - Increased Risk) 10/25/1956   Yearly kidney health urinalysis for diabetes  10/10/2021   Eye exam for diabetics  12/18/2021   COVID-19 Vaccine (6 - 2023-24 season) 01/01/2023*   Complete foot exam   01/06/2023   Hemoglobin A1C  06/11/2023   Yearly kidney function blood test for diabetes  12/13/2023   Medicare Annual Wellness Visit  12/17/2023   DTaP/Tdap/Td vaccine (4 - Td or Tdap) 09/23/2031   Pneumonia Vaccine  Completed   Flu Shot  Completed   Zoster (Shingles) Vaccine  Completed   HPV Vaccine  Aged Out   Colon Cancer Screening  Discontinued   Hepatitis C Screening: USPSTF Recommendation to screen - Ages 76-79 yo.  Discontinued  *Topic was postponed. The date shown is not the original due date.    Advanced directives: In Chart  Conditions/risks identified: None  Next appointment: Follow up in one year for your annual wellness visit.    Preventive Care 79 Years and Older, Male  Preventive care refers to lifestyle choices and visits with your health care provider that can promote health and wellness. What does preventive care include? A yearly physical exam. This is also called an annual well check. Dental exams once or twice a year. Routine eye exams. Ask your health care provider how often you should have your eyes checked. Personal lifestyle choices, including: Daily care of your teeth and gums. Regular physical activity. Eating a healthy diet. Avoiding tobacco and drug use. Limiting alcohol use. Practicing safe  sex. Taking low doses of aspirin every day. Taking vitamin and mineral supplements as recommended by your health care provider. What happens during an annual well check? The services and screenings done by your health care provider during your annual well check will depend on your age, overall health, lifestyle risk factors, and family history of disease. Counseling  Your health care provider may ask you questions about your: Alcohol use. Tobacco use. Drug use. Emotional well-being. Home and relationship well-being. Sexual activity. Eating habits. History of falls. Memory and ability to understand (cognition). Work and work Statistician. Screening  You may have the following tests or measurements: Height, weight, and BMI. Blood pressure. Lipid and cholesterol levels. These may be checked every 5 years, or more frequently if you are over 78 years old. Skin check. Lung cancer screening. You may have this screening every year starting at age 70 if you have a 30-pack-year history of smoking and currently smoke or have quit within the past 15 years. Fecal occult blood test (FOBT) of the stool. You may have this test every year starting at age 32. Flexible sigmoidoscopy or colonoscopy. You may have a sigmoidoscopy every 5 years or a colonoscopy every 10 years starting at age 16. Prostate cancer screening. Recommendations will vary depending on your family history and other risks. Hepatitis C blood test. Hepatitis B blood test. Sexually transmitted disease (STD) testing. Diabetes screening. This is done by checking  your blood sugar (glucose) after you have not eaten for a while (fasting). You may have this done every 1-3 years. Abdominal aortic aneurysm (AAA) screening. You may need this if you are a current or former smoker. Osteoporosis. You may be screened starting at age 53 if you are at high risk. Talk with your health care provider about your test results, treatment options, and if  necessary, the need for more tests. Vaccines  Your health care provider may recommend certain vaccines, such as: Influenza vaccine. This is recommended every year. Tetanus, diphtheria, and acellular pertussis (Tdap, Td) vaccine. You may need a Td booster every 10 years. Zoster vaccine. You may need this after age 88. Pneumococcal 13-valent conjugate (PCV13) vaccine. One dose is recommended after age 73. Pneumococcal polysaccharide (PPSV23) vaccine. One dose is recommended after age 37. Talk to your health care provider about which screenings and vaccines you need and how often you need them. This information is not intended to replace advice given to you by your health care provider. Make sure you discuss any questions you have with your health care provider. Document Released: 12/28/2015 Document Revised: 08/20/2016 Document Reviewed: 10/02/2015 Elsevier Interactive Patient Education  2017 [place] Prevention in the Home Falls can cause injuries. They can happen to people of all ages. There are many things you can do to make your home safe and to help prevent falls. What can I do on the outside of my home? Regularly fix the edges of walkways and driveways and fix any cracks. Remove anything that might make you trip as you walk through a door, such as a raised step or threshold. Trim any bushes or trees on the path to your home. Use bright outdoor lighting. Clear any walking paths of anything that might make someone trip, such as rocks or tools. Regularly check to see if handrails are loose or broken. Make sure that both sides of any steps have handrails. Any raised decks and porches should have guardrails on the edges. Have any leaves, snow, or ice cleared regularly. Use sand or salt on walking paths during winter. Clean up any spills in your garage right away. This includes oil or grease spills. What can I do in the bathroom? Use night lights. Install grab bars by the toilet  and in the tub and shower. Do not use towel bars as grab bars. Use non-skid mats or decals in the tub or shower. If you need to sit down in the shower, use a plastic, non-slip stool. Keep the floor dry. Clean up any water that spills on the floor as soon as it happens. Remove soap buildup in the tub or shower regularly. Attach bath mats securely with double-sided non-slip rug tape. Do not have throw rugs and other things on the floor that can make you trip. What can I do in the bedroom? Use night lights. Make sure that you have a light by your bed that is easy to reach. Do not use any sheets or blankets that are too big for your bed. They should not hang down onto the floor. Have a firm chair that has side arms. You can use this for support while you get dressed. Do not have throw rugs and other things on the floor that can make you trip. What can I do in the kitchen? Clean up any spills right away. Avoid walking on wet floors. Keep items that you use a lot in easy-to-reach places. If you need to reach something  above you, use a strong step stool that has a grab bar. Keep electrical cords out of the way. Do not use floor polish or wax that makes floors slippery. If you must use wax, use non-skid floor wax. Do not have throw rugs and other things on the floor that can make you trip. What can I do with my stairs? Do not leave any items on the stairs. Make sure that there are handrails on both sides of the stairs and use them. Fix handrails that are broken or loose. Make sure that handrails are as long as the stairways. Check any carpeting to make sure that it is firmly attached to the stairs. Fix any carpet that is loose or worn. Avoid having throw rugs at the top or bottom of the stairs. If you do have throw rugs, attach them to the floor with carpet tape. Make sure that you have a light switch at the top of the stairs and the bottom of the stairs. If you do not have them, ask someone to add  them for you. What else can I do to help prevent falls? Wear shoes that: Do not have high heels. Have rubber bottoms. Are comfortable and fit you well. Are closed at the toe. Do not wear sandals. If you use a stepladder: Make sure that it is fully opened. Do not climb a closed stepladder. Make sure that both sides of the stepladder are locked into place. Ask someone to hold it for you, if possible. Clearly mark and make sure that you can see: Any grab bars or handrails. First and last steps. Where the edge of each step is. Use tools that help you move around (mobility aids) if they are needed. These include: Canes. Walkers. Scooters. Crutches. Turn on the lights when you go into a dark area. Replace any light bulbs as soon as they burn out. Set up your furniture so you have a clear path. Avoid moving your furniture around. If any of your floors are uneven, fix them. If there are any pets around you, be aware of where they are. Review your medicines with your doctor. Some medicines can make you feel dizzy. This can increase your chance of falling. Ask your doctor what other things that you can do to help prevent falls. This information is not intended to replace advice given to you by your health care provider. Make sure you discuss any questions you have with your health care provider. Document Released: 09/27/2009 Document Revised: 05/08/2016 Document Reviewed: 01/05/2015 Elsevier Interactive Patient Education  2017 [employer].

## 2022-12-22 ENCOUNTER — Encounter (HOSPITAL_BASED_OUTPATIENT_CLINIC_OR_DEPARTMENT_OTHER): Payer: Self-pay

## 2022-12-22 ENCOUNTER — Encounter (HOSPITAL_BASED_OUTPATIENT_CLINIC_OR_DEPARTMENT_OTHER): Payer: Medicare Other | Attending: Internal Medicine | Admitting: Internal Medicine

## 2022-12-22 DIAGNOSIS — I10 Essential (primary) hypertension: Secondary | ICD-10-CM | POA: Diagnosis not present

## 2022-12-22 DIAGNOSIS — Y842 Radiological procedure and radiotherapy as the cause of abnormal reaction of the patient, or of later complication, without mention of misadventure at the time of the procedure: Secondary | ICD-10-CM | POA: Insufficient documentation

## 2022-12-22 DIAGNOSIS — N189 Chronic kidney disease, unspecified: Secondary | ICD-10-CM | POA: Insufficient documentation

## 2022-12-22 DIAGNOSIS — C61 Malignant neoplasm of prostate: Secondary | ICD-10-CM

## 2022-12-22 DIAGNOSIS — E1121 Type 2 diabetes mellitus with diabetic nephropathy: Secondary | ICD-10-CM

## 2022-12-22 DIAGNOSIS — N3041 Irradiation cystitis with hematuria: Secondary | ICD-10-CM | POA: Insufficient documentation

## 2022-12-22 DIAGNOSIS — Z8546 Personal history of malignant neoplasm of prostate: Secondary | ICD-10-CM | POA: Insufficient documentation

## 2022-12-22 DIAGNOSIS — I129 Hypertensive chronic kidney disease with stage 1 through stage 4 chronic kidney disease, or unspecified chronic kidney disease: Secondary | ICD-10-CM | POA: Diagnosis not present

## 2022-12-22 DIAGNOSIS — E1122 Type 2 diabetes mellitus with diabetic chronic kidney disease: Secondary | ICD-10-CM | POA: Insufficient documentation

## 2022-12-23 ENCOUNTER — Ambulatory Visit: Payer: Medicare Other | Admitting: Podiatry

## 2022-12-23 NOTE — Progress Notes (Addendum)
HORTON, ELLITHORPE ([id number]) [id number] Page 1 of 9 Visit Report for 12/22/2022 Chief Complaint Document Details Patient Name: Date of Service: [name] [name] [name] 12/22/2022 9:00 [name] [name] Medical Record Number: [id number] Patient Account Number: [id number] Date of Birth/Sex: Treating RN: 1946-06-29 (77 y.o. 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 12/22/2022 9:00 [name] [name] Medical Record Number: [id number] Patient Account Number: [id number] Date of Birth/Sex: Treating RN: 07-26-1946 (77 y.o. [name]) Primary Care Provider: [name], [name] Other Clinician: Referring Provider: Treating Provider/Extender: [name] [name] [name] [name] [name], [name] Weeks in Treatment: 0 History of Present Illness HPI Description: 12/22/2022 [name] [name] [name] is [name] 77 year old male with [name] past medical history of insulin-dependent controlled type 2 diabetes with last hemoglobin A1c of 6.8 on 12/11/2019, primary cancer of body of pancreas, prostate cancer status post radiation, essential hypertension and CABG that presents to the clinic for discussion of HBO therapy in the setting of radiation cystitis. For his prostate cancer patient was started on [name] ADT on 09/17/2012, and radiation therapy to [name] dose of 78 GY completed on 01/21/2013. Lupron #8/8 completed on 06/23/2014. He has described urinary urgency and frequency over the past several years. He follows with urology last seen on 11/21/2022  and was referred to [place] for HBO therapy. During that visit he described symptoms of urinary urgency/frequency/weak stream along with gross hematuria. He had [name] cystoscopy on 03/04/2022 that showed evidence of radiation cystitis changes with some increased neovascularity along the posterior bladder and trigone with no suspicious area concerning for CIS or papillary lesions. He reports 2 episodes of gross hematuria over the past year. He currently denies signs of infection. Electronic Signature(s) Signed: 12/22/2022 12:33:43 PM By: [name] [name] [name] Entered By: [name] [name] [name] on 12/22/2022 12:14:34 -------------------------------------------------------------------------------- Physical Exam Details Patient Name: Date of Service: McCormick, [name] [name] [name] [name]. 12/22/2022 9:00 [name] [name] Medical Record Number: [id number] Patient Account Number: [id number] [name] [name] [name], [name] [name] [name] ([id number]) [id number] Page 2 of 9 Date of Birth/Sex: Treating RN: Sep 22, 1946 (77 y.o. [name]) Primary Care Provider: [name], [name] Other Clinician: Referring Provider: Treating Provider/Extender: [name] [name] [name] [name] [name], [name] Weeks in Treatment: 0 Constitutional respirations regular, non-labored and within target range for patient.Marland Kitchen Respiratory clear to auscultation bilaterally. Cardiovascular regular rate and rhythm with normal S1, S2. Psychiatric pleasant and cooperative. Electronic Signature(s) Signed: 12/22/2022 12:33:43 PM By: [name] [name] [name] Entered By: [name] [name] [name] on 12/22/2022 12:14:42 -------------------------------------------------------------------------------- Physician Orders Details Patient Name: Date of Service: [name] [name] [name], [name] [name] [name] [name]. 12/22/2022 9:00 [name] [name] Medical Record Number: [id number] Patient Account Number: [id number] Date of Birth/Sex: Treating RN: 01/29/1946 (77 y.o. 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 Return [name] Hyperbaric Oxygen Therapy Evaluate for HBO Therapy Indication: - Radiation Cystitis If appropriate for treatment, begin  HBOT per protocol: 2.5 ATA for 90 Minutes with 2 Five (5) Minute [name] [name] [name] ir Total Number of Treatments: - 40 One treatments per day (delivered Monday through Friday unless otherwise specified in Special Instructions below): Finger stick [name] [name] Pre- and Post- HBOT Treatment. Follow Hyperbaric Oxygen Glycemia Protocol [name] frin (Oxymetazoline HCL) 0.05% nasal spray - 1 spray in both nostrils daily as needed prior to HBO treatment for difficulty clearing ears Consults Cardiology- [name] [name] [name] - Need [name] note from Cardiologist [name] [name] [name] needing clearance for hyberbaric oxygen therapy due to extensive heart disease. - (ICD10 I10 - Essential (primary) hypertension) GLYCEMIA INTERVENTIONS PROTOCOL PRE-HBO GLYCEMIA INTERVENTIONS ACTION INTERVENTION Obtain pre-HBO capillary [name] [name] (ensure 1 physician order is in chart). [name]. Notify HBO physician and await physician orders. 2 If result is 70 mg/dl or below: B. If the result meets the hospital definition of [name] critical result, follow hospital policy. [name] [name] [name] ([id number]) [id number] Page 3 of 9 [name]. Give patient an 8 ounce Glucerna Shake, an 8 ounce Ensure, or 8 ounces of [name] Glucerna/Ensure equivalent dietary supplement*. B. Wait 30 minutes. If result is 71 mg/dl to 130 mg/dl: C. Retest patients  capillary [name] [name] (CBG). D. If result greater than or equal to 110 mg/dl, proceed with HBO. If result less than 110 mg/dl, notify HBO physician and consider holding HBO. If result is 131 mg/dl to 249 mg/dl: [name]. Proceed with HBO. [name]. Notify HBO physician and await physician orders. B. It is recommended to hold HBO and [name] If result is 250 mg/dl or greater: [name] [name]/urine ketone testing. C. If the result meets the hospital definition of [name] critical result, follow hospital policy. POST-HBO GLYCEMIA INTERVENTIONS ACTION INTERVENTION Obtain post HBO capillary [name] [name] (ensure 1 physician order is in chart). [name]. Notify HBO physician and await physician orders. 2 If result is 70 mg/dl or below: B. If the result meets the hospital definition of [name] critical result, follow hospital policy. [name]. Give patient an 8 ounce Glucerna Shake, an 8 ounce Ensure, or 8 ounces of [name] Glucerna/Ensure equivalent dietary supplement*. B. Wait 15 minutes for symptoms of If result is 71 mg/dl to 100 mg/dl: hypoglycemia (i.e. nervousness, anxiety, sweating, chills, clamminess, irritability, confusion, tachycardia or dizziness). C. If patient asymptomatic, discharge patient. If patient symptomatic, repeat capillary [name] [name] (CBG) and notify HBO physician. If result is 101 mg/dl to 249 mg/dl: [name]. Discharge patient. [name]. Notify HBO physician and await physician orders. B. It is recommended to [name] [name] [name]/urine ketone If result is 250 mg/dl or greater: testing. C. If the result meets the hospital definition of [name] critical result, follow hospital policy. *Juice or candies are NOT equivalent products. If patient refuses the Glucerna or Ensure, please consult the hospital dietitian for an appropriate substitute. Electronic Signature(s) Signed: 12/22/2022 12:33:43 PM By: [name] [name] [name] Entered By: [name] [name] [name] on 12/22/2022 12:14:51 Prescription  12/22/2022 -------------------------------------------------------------------------------- [name] [name] [name], [name] [name] [name]. [name] [name] [name] Patient Name: Provider: 01-07-1946 [id number] Date of Birth: NPI#: Jerilynn Mages [id number] Sex: DEA #: [phone] Phone #: License #: [place] Patient Address: [address] [place], [place] [address] [place], [place] [address] [phone] Allergies Keflex; lisinopril; terazosin Provider's Orders Cardiology- [name] [name] [name] - ICD10: I10 - Need [name] note from Cardiologist [name] [name] [name] needing clearance for hyberbaric oxygen therapy due to extensive heart disease. Hand Signature: Date(s): TOD, ABRAHAMSEN ([id number]) [id number] Page 4 of 9 Electronic Signature(s) Signed: 12/22/2022 12:33:43 PM By: [name] Shan  [name] Entered By: [name] [name] [name] on 12/22/2022 12:14:51 -------------------------------------------------------------------------------- Problem List Details Patient Name: Date of Service: [name] [name] [name]. 12/22/2022 9:00 [name] [name] Medical Record Number: [id number] Patient Account Number: [id number] Date of Birth/Sex: Treating RN: 05-31-1946 (77 y.o. 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 12/22/2022 9:00 [name] [name] Medical Record Number: [id number] Patient Account Number: [id number] Date of Birth/Sex: Treating RN: [birth year] (77 y.o. [name]) Primary Care Provider: [name], [name] Other Clinician: Referring Provider: Treating Provider/Extender: [name] Maragh [name] [name], [name] Weeks in Treatment: 0 Subjective Chief Complaint CELESTE, CANDELAS ([id number]) [id number] Page 5 of 9 Information obtained from Patient 12/22/2022; referral for hyperbaric oxygen therapy in the setting of radiation cystitis History of Present Illness (HPI) 12/22/2022 Mr. Jammie Clink is [name] 77 year old male with [name] past medical history of insulin-dependent controlled type 2 diabetes with last hemoglobin A1c of 6.8 on 12/11/2019, primary cancer of body of pancreas, prostate cancer status post radiation, essential hypertension and CABG that presents to the clinic for discussion of HBO therapy in the setting of radiation cystitis. For his prostate cancer patient was started on [name] ADT on 09/17/2012, and radiation therapy to [name] dose of 78 GY completed on 01/21/2013. Lupron #8/8 completed on 06/23/2014. He has described urinary urgency and frequency over the past several years. He follows with urology last seen on 11/21/2022 and was referred to [place] for HBO therapy. During that visit he described symptoms of urinary urgency/frequency/weak stream along with gross hematuria. He had [name] cystoscopy on 03/04/2022 that showed evidence of radiation cystitis changes with some increased neovascularity along the posterior bladder and trigone with no suspicious area concerning for CIS or papillary lesions. He reports 2 episodes of gross hematuria over the past year. He currently denies signs of infection. Patient History Information obtained from Patient,  Chart. Allergies Keflex, lisinopril, terazosin Family History Unknown History. Social History Former smoker - quit in 1976, Marital Status - Married, Alcohol Use - Never, Drug Use - No History, Caffeine Use - Rarely. Medical History Hematologic/Lymphatic Patient has history of Anemia Respiratory Patient has history of Sleep Apnea - uses CPAP Cardiovascular Patient has history of Arrhythmia - [name] fibb, Coronary Artery Disease, Hypertension, Myocardial Infarction Endocrine Patient has history of Type II Diabetes Musculoskeletal Patient has history of Osteoarthritis Neurologic Patient has history of Neuropathy Oncologic Patient has history of Received Chemotherapy - 2022- Pancreatic Ca with chemo, Received Radiation - 10-12 years ago for prostate Ca Psychiatric Denies history of Confinement Anxiety Patient is treated with Insulin. [name] [name] sugar is tested. Hospitalization/Surgery History - EUS. - Biopsy. - CA bypass graft. - cholecystectomy. - tonsillectomy. - appendectomy. - triple bypass. Medical [name] Surgical History Notes nd Cardiovascular hyperlipidemia Gastrointestinal fatty liver; GERD Genitourinary radiation cystitis, CKD, [name] [name] in urine Oncologic hx prostate ca Psychiatric PTSD Review of Systems (ROS) Constitutional Symptoms (General Health) Denies complaints or symptoms of Fatigue, Fever, Chills, Marked Weight Change. Eyes Denies complaints or symptoms of Dry Eyes, Vision Changes,  Glasses / Contacts. Ear/Nose/Mouth/Throat Denies complaints or symptoms of Chronic sinus problems or rhinitis. Integumentary (Skin) Denies complaints or symptoms of Wounds. Psychiatric Denies complaints or symptoms of Claustrophobia. Objective Constitutional respirations regular, non-labored and within target range for patient.. Vitals Time Taken: 9:20 AM, Height: 71 in, Source: Stated, Weight: 252 lbs, Source: Stated, BMI: 35.1, Temperature: 97.7 F, Pulse: 83 bpm, Respiratory Rate: 20  breaths/min, [name] [name] Pressure: 155/65 mmHg, Capillary [name] [name]: 102 mg/dl. [name] [name] [name], [name] [name] [name] ([id number]) [id number] Page 6 of 9 Respiratory clear to auscultation bilaterally. Cardiovascular regular rate and rhythm with normal S1, S2. Psychiatric pleasant and cooperative. Assessment Active Problems ICD-10 Irradiation cystitis with hematuria Essential (primary) hypertension Type 2 diabetes mellitus with diabetic nephropathy Malignant neoplasm of prostate HBO Evaluation Hx of prior radiation Started ADT 09/17/12 and radiation therapy on 11/17/2012 to [name] dose of 23 GY completed on 01/21/2013 Lupron #8/8 completed 06/23/2014 Location of STRN Prostate + SV + pelvic + involved LN Description of symptoms Patient follows with urology and at last clinic visit on 11/21/2022 he reports weak stream, urinary urgency and frequency. He has had nocturia 2 times. He has reported the symptoms for several years. He has failed anticholinergic medication in the past. Other tests He had [name] cystoscopy on 03/04/2022 showed evidence of radiation cystitis changes with some increased neovascularity along the posterior bladder and trigone Plan of care/Summary Due to his urinary symptoms and cystoscopy findings of radiation cystitis he would benefit from hyperbaric oxygen therapy. In conjunction with continued standard of care I am ordering 40 HBO treatments at 2.5 ATA with two 5-minute air [name] 5 times per week for 90 minutes at depth with goal to decrease urinary frequency and urgency. The patient will be monitored for ongoing symptoms. We will collaborate with urology as needed. Mr. Tallman is [name] 77 year old male with [name] history of prostate cancer diagnosed in 2013 and treated with radiation to [name] dose of 79 GY completed on 01/21/2013. He had [name] cystoscopy on 03/04/22 that revealed evidence of radiation cystitis changes with some increased neovascularity along posterior bladder and  trigone. For several years patient has described increased urinary urgency and frequency noted by his urologist Dr. Abner Greenspan during office visits. He has failed anticholinergic medication in the past. He has [name] history of recurrent UTIs. Due to his symptoms and cystoscopy findings he would benefit from hyperbaric oxygen therapy. In conjunction with continued standard of care I am ordering 40 HBO treatments at 2.5 ATA with two 5-minute air [name] 5 times per week for 90 minutes at depth with goal to decrease urinary frequency and urgency. The patient will be monitored for ongoing symptoms. We will collaborate with urology as needed. Patient has an extensive heart history including CABG and follows with [name] [name] [name] for follow-up. We will request clearance for HBO therapy. Chest x-ray done on 02/06/2022 shows lungs are clear bilaterally. Plan Follow-up Appointments: Return Appointment in: - Will call you once insurance approves for hyberbaric oxygen treatment to get you scheduled to come in. Hyperbaric Oxygen Therapy: Evaluate for HBO Therapy Indication: - Radiation Cystitis If appropriate for treatment, begin HBOT per protocol: 2.5 ATA for 90 Minutes with 2 Five (5) Minute Air [name] T Number of Treatments: - 40 otal One treatments per day (delivered Monday through Friday unless otherwise specified in Special Instructions below): Finger stick [name] [name] Pre- and Post- HBOT Treatment. Follow Hyperbaric Oxygen Glycemia Protocol [name] [name] [name], [name] [name] [name] ([id number]) [id number] Page 7 of 9 Afrin (Oxymetazoline HCL) 0.05% nasal spray - 1 spray  in both nostrils daily as needed prior to HBO treatment for difficulty clearing ears Consults ordered were: Cardiology- [name] [name] [name] - Need [name] note from Cardiologist [name] [name] [name] needing clearance for hyberbaric oxygen therapy due to extensive heart disease. 1. Insurance approval for HBO therapy of radiation cystitis for 40 HBO  treatments at 2.5 ATA with two 5-minute air [name], 5 times per week for 90 minutes at depth with goal to decrease urinary frequency and urgency 2. Cardiac clearnace Electronic Signature(s) Signed: 01/07/2023 9:30:16 AM By: [name] [name] [name] Previous Signature: 01/07/2023 9:26:44 AM Version By: [name] [name] [name] Previous Signature: 12/22/2022 12:33:43 PM Version By: [name] [name] [name] Entered By: [name] [name] [name] on 01/07/2023 09:29:04 -------------------------------------------------------------------------------- HxROS Details Patient Name: Date of Service: Vass, [name] [name] [name] [name]. 12/22/2022 9:00 [name] [name] Medical Record Number: [id number] Patient Account Number: [id number] Date of Birth/Sex: Treating RN: [birth year] (77 y.o. Erie Noe Primary Care Provider: [name], [name] Other Clinician: Referring Provider: Treating Provider/Extender: Maryln Eastham [name] [name], [name] Weeks in Treatment: 0 Information Obtained From Patient Chart Constitutional Symptoms (General Health) Complaints and Symptoms: Negative for: Fatigue; Fever; Chills; Marked Weight Change Eyes Complaints and Symptoms: Negative for: Dry Eyes; Vision Changes; Glasses / Contacts Ear/Nose/Mouth/Throat Complaints and Symptoms: Negative for: Chronic sinus problems or rhinitis Integumentary (Skin) Complaints and Symptoms: Negative for: Wounds Psychiatric Complaints and Symptoms: Negative for: Claustrophobia Medical History: Negative for: Confinement Anxiety Past Medical History Notes: PTSD Hematologic/Lymphatic Medical History: Positive for: Anemia Respiratory Medical History: Positive for: Sleep Apnea - uses CPAP [name] [name] [name], [name] [name] [name] ([id number]) [id number] Page 8 of 9 Cardiovascular Medical History: Positive for: Arrhythmia - [name] fibb; Coronary Artery Disease; Hypertension; Myocardial Infarction Past Medical History Notes: hyperlipidemia Gastrointestinal Medical History: Past  Medical History Notes: fatty liver; GERD Endocrine Medical History: Positive for: Type II Diabetes Time with diabetes: 10 years Treated with: Insulin [name] [name] sugar tested every day: Yes Tested : TID Genitourinary Medical History: Past Medical History Notes: radiation cystitis, CKD, [name] [name] in urine Immunological Musculoskeletal Medical History: Positive for: Osteoarthritis Neurologic Medical History: Positive for: Neuropathy Oncologic Medical History: Positive for: Received Chemotherapy - 2022- Pancreatic Ca with chemo; Received Radiation - 10-12 years ago for prostate Ca Past Medical History Notes: hx prostate ca Immunizations Pneumococcal Vaccine: Received Pneumococcal Vaccination: Yes Received Pneumococcal Vaccination On or After 60th Birthday: Yes Implantable Devices None Hospitalization / Surgery History Type of Hospitalization/Surgery EUS Biopsy CA bypass graft cholecystectomy tonsillectomy appendectomy triple bypass Family and Social History Unknown History: Yes; Former smoker - quit in 1976; Marital Status - Married; Alcohol Use: Never; Drug Use: No History; Caffeine Use: Rarely; Financial Concerns: No; Food, Clothing or Shelter Needs: No; Support System Lacking: No; Transportation Concerns: No Electronic Signature(s) Signed: 12/22/2022 12:33:43 PM By: [name] [name] [name] Signed: 12/22/2022 4:22:55 PM By: Rhae Hammock RN Signed: 12/22/2022 6:29:44 PM By: Deon Pilling RN, BSN Entered By: Deon Pilling on 12/22/2022 09:28:33 [name] [name] [name] ([id number]) [id number] Page 9 of 9 -------------------------------------------------------------------------------- [name] Details Patient Name: Date of Service: [name] [name] [name] 12/22/2022 Medical Record Number: [id number] Patient Account Number: [id number] Date of Birth/Sex: Treating RN: [name] [name] 31, 1947 (77 y.o. 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 C61 Malignant neoplasm of prostate Facility Procedures : CPT4 Code: 53614431 Description: 54008 - WOUND CARE VISIT-LEV 3 EST PT Modifier:  Quantity: 1 Physician Procedures : CPT4 Code Description Modifier [id number] - WC PHYS LEVEL 4 - NEW PT ICD-10 Diagnosis Description 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

## 2022-12-23 NOTE — Progress Notes (Signed)
Bill, [name] ([id number]) [id number] Page 1 of 6 Visit Report for 12/22/2022 Allergy List Details Patient Name: Date of Service: [name]. 12/22/2022 9:00 [name] Medical Record Number: [id number] Patient Account Number: [id number] Date of Birth/Sex: Treating RN: August 28, 1946 (77 y.o. [name] Primary Care Maze Corniel: [name] Other Clinician: Referring [name]: Treating [name]/Extender: [name], Jessica [name] Weeks in Treatment: 0 Allergies Active Allergies Keflex lisinopril terazosin Allergy Notes Electronic Signature(s) Signed: 12/22/2022 4:22:55 PM By: Rhae Hammock RN Entered By: Rhae Hammock on 12/19/2022 11:55:45 -------------------------------------------------------------------------------- Arrival Information Details Patient Name: Date of Service: [name], [name]. 12/22/2022 9:00 [name] Medical Record Number: [id number] Patient Account Number: [id number] Date of Birth/Sex: Treating RN: 1946/09/05 (77 y.o. [name] Primary Care Analayah Brooke: [name] Other Clinician: Referring Steaven Wholey: Treating [name]/Extender: [name], Jessica [name] Weeks in Treatment: 0 Visit Information Patient Arrived: [name] Time: 09:20 Accompanied By: wife Transfer Assistance: None Patient Identification Verified: Yes Secondary Verification Process Completed: Yes Patient Requires Transmission-Based Precautions: No Patient Has Alerts: Yes Patient Alerts: Patient on [name] Electronic Signature(s) Signed: 12/22/2022 6:29:44 PM By: Bill Pilling RN, [name] Entered By: [name] on 12/22/2022 09:21:44 [name] ([id number]) [id number] Page 2 of 6 -------------------------------------------------------------------------------- Clinic Level of Care Assessment Details Patient Name: Date of Service: [name] 12/22/2022 9:00 [name] Medical Record  Number: [id number] Patient Account Number: [id number] Date of Birth/Sex: Treating RN: Nov 06, 1946 (77 y.o. [name] Primary Care Cristino Degroff: [name] Other Clinician: Referring [name]: Treating [name]/Extender: [name], Jessica [name] Weeks in Treatment: 0 Clinic Level of Care Assessment Items TOOL 2 Quantity Score X- 1 0 Use when only an EandM is performed on the INITIAL visit ASSESSMENTS - Nursing Assessment / Reassessment X- 1 20 General Physical Exam (combine w/ comprehensive assessment (listed just below) when performed on new pt. evals) X- 1 25 Comprehensive Assessment (HX, ROS, Risk Assessments, Wounds Hx, etc.) ASSESSMENTS - Wound and Skin Bill ssessment / Reassessment '[]'$  - 0 Simple Wound Assessment / Reassessment - one wound '[]'$  - 0 Complex Wound Assessment / Reassessment - multiple wounds '[]'$  - 0 Dermatologic / Skin Assessment (not related to wound area) ASSESSMENTS - Ostomy and/or Continence Assessment and Care '[]'$  - 0 Incontinence Assessment and Management '[]'$  - 0 Ostomy Care Assessment and Management (repouching, etc.) PROCESS - Coordination of Care X - Simple Patient / Family Education for ongoing care 1 15 '[]'$  - 0 Complex (extensive) Patient / Family Education for ongoing care X- 1 10 Staff obtains Programmer, systems, Records, T Results / Process Orders est '[]'$  - 0 Staff telephones HHA, Nursing Homes / Clarify orders / etc '[]'$  - 0 Routine Transfer to another Facility (non-emergent condition) '[]'$  - 0 Routine Hospital Admission (non-emergent condition) X- 1 15 New Admissions / Biomedical engineer / Ordering NPWT Apligraf, etc. , '[]'$  - 0 Emergency Hospital Admission (emergent condition) X- 1 10 Simple Discharge Coordination '[]'$  - 0 Complex (extensive) Discharge Coordination PROCESS - Special Needs '[]'$  - 0 Pediatric / Minor Patient Management '[]'$  - 0 Isolation Patient Management '[]'$  - 0 Hearing / Language / Visual special needs '[]'$  - 0 Assessment of  Community assistance (transportation, D/C planning, etc.) '[]'$  - 0 Additional assistance / Altered mentation '[]'$  - 0 Support Surface(s) Assessment (bed, cushion, seat, etc.) INTERVENTIONS - Wound Cleansing / Measurement '[]'$  - 0 Wound Imaging (photographs - any number of wounds) '[]'$  - 0 Wound Tracing (instead of photographs) '[]'$  - 0  Simple Wound Measurement - one wound '[]'$  - 0 Complex Wound Measurement - multiple wounds '[]'$  - 0 Simple Wound Cleansing - one wound [name], [name] ([id number]) [id number] Page 3 of 6 '[]'$  - 0 Complex Wound Cleansing - multiple wounds INTERVENTIONS - Wound Dressings '[]'$  - 0 Small Wound Dressing one or multiple wounds '[]'$  - 0 Medium Wound Dressing one or multiple wounds '[]'$  - 0 Large Wound Dressing one or multiple wounds '[]'$  - 0 Application of Medications - injection INTERVENTIONS - Miscellaneous '[]'$  - 0 External ear exam '[]'$  - 0 Specimen Collection (cultures, biopsies, [name], body fluids, etc.) '[]'$  - 0 Specimen(s) / Culture(s) sent or taken to Lab for analysis '[]'$  - 0 Patient Transfer (multiple staff / [name] / Similar devices) '[]'$  - 0 Simple Staple / Suture removal (25 or less) '[]'$  - 0 Complex Staple / Suture removal (26 or more) '[]'$  - 0 Hypo / Hyperglycemic Management (close monitor of [name]) '[]'$  - 0 Ankle / Brachial Index (ABI) - do not check if billed separately Has the patient been seen at the hospital within the last three years: Yes Total Score: 95 Level Of Care: New/Established - Level 3 Electronic Signature(s) Signed: 12/22/2022 6:29:44 PM By: Bill Pilling RN, [name] Entered By: [name] on 12/22/2022 09:50:40 -------------------------------------------------------------------------------- Encounter Discharge Information Details Patient Name: Date of Service: [name], [name]. 12/22/2022 9:00 [place] Record Number: [id number] Patient Account Number: [id number] Date of Birth/Sex: Treating  RN: 07-02-1946 (77 y.o. [name] Primary Care Feliberto Stockley: [name] Other Clinician: Referring Savior Himebaugh: Treating Eva Griffo/Extender: [name], Jessica [name] Weeks in Treatment: 0 Encounter Discharge Information Items Discharge Condition: Stable Ambulatory Status: Cane Discharge Destination: Home Transportation: Private Auto Accompanied By: wife Schedule Follow-up Appointment: Yes Clinical Summary of Care: Electronic Signature(s) Signed: 12/22/2022 6:29:44 PM By: Bill Pilling RN, [name] Entered By: [name] on 12/22/2022 09:57:32 -------------------------------------------------------------------------------- Multi Wound Chart Details Patient Name: Date of Service: [name], [name]. 12/22/2022 9:00 Bill Cheryln Manly ([id number]) [id number] Page 4 of 6 Medical Record Number: [id number] Patient Account Number: [id number] Date of Birth/Sex: Treating RN: 1946-04-02 (77 y.o. [name]) Primary Care Yaretsi Humphres: [name] Other Clinician: Referring Benjerman Molinelli: Treating Rashana Andrew/Extender: [name], Jessica [name] Weeks in Treatment: 0 Vital Signs Height(in): 71 Capillary [name](mg/dl): 102 Weight(lbs): 252 Pulse(bpm): 83 Body Mass Index(BMI): 35.1 [name] Pressure(mmHg): 155/65 Temperature(F): 97.7 Respiratory Rate(breaths/min): 20 [Treatment Notes:Wound Assessments Treatment Notes] Electronic Signature(s) Signed: 12/22/2022 12:33:43 PM By: [name] DO Entered By: [name] on 12/22/2022 10:18:38 -------------------------------------------------------------------------------- Multi-Disciplinary Care Plan Details Patient Name: Date of Service: [facility], [name]. 12/22/2022 9:00 [name] Medical Record Number: [id number] Patient Account Number: [id number] Date of Birth/Sex: Treating RN: [birth year] (77 y.o. [name] Primary Care Treasure Ingrum: [name] Other Clinician: Referring Evella Kasal: Treating  Masa Lubin/Extender: [name], Jessica [name] Weeks in Treatment: 0 Active Inactive HBO Nursing Diagnoses: Anxiety related to feelings of confinement associated with the hyperbaric oxygen chamber Potential for barotraumas to ears, sinuses, teeth, and lungs or cerebral gas embolism related to changes in atmospheric pressure inside hyperbaric oxygen chamber Goals: Patient will tolerate the hyperbaric oxygen therapy treatment Date Initiated: 12/22/2022 Target Resolution Date: 01/23/2023 Goal Status: Active Patient/caregiver will verbalize understanding of HBO goals, rationale, procedures and potential hazards Date Initiated: 12/22/2022 Target Resolution Date: 01/23/2023 Goal Status: Active Interventions: Assess and provide for patients comfort related to the hyperbaric environment and equalization of middle ear Assess patient for any history of confinement  anxiety Notes: Orientation to the Wound Care Program Nursing Diagnoses: Knowledge deficit related to the wound healing center program Goals: Patient/caregiver will verbalize understanding of the [place] Date Initiated: 12/22/2022 Target Resolution Date: 01/23/2023 Goal Status: Active Interventions: Provide education on orientation to the wound center [facility] [place] ([id number]) [id number] Page 5 of 6 Notes: Electronic Signature(s) Signed: 12/22/2022 6:29:44 PM By: Bill Pilling RN, [name] Entered By: [name] on 12/22/2022 09:49:50 -------------------------------------------------------------------------------- Pain Assessment Details Patient Name: Date of Service: [place], [name]. 12/22/2022 9:00 [name] Medical Record Number: [id number] Patient Account Number: [id number] Date of Birth/Sex: Treating RN: Nov 07, 1946 (77 y.o. [name] Primary Care Pavlos Yon: [name] Other Clinician: Referring Buffi Ewton: Treating Kassius Battiste/Extender: [name], Jessica [name] Weeks  in Treatment: 0 Active Problems Location of Pain Severity and Description of Pain Patient Has Paino No Site Locations Rate the pain. Current Pain Level: 0 Pain Management and Medication Current Pain Management: Medication: No Cold Application: No Rest: No Massage: No Activity: No T.E.N.S.: No Heat Application: No Leg drop or elevation: No Is the Current Pain Management Adequate: Adequate How does your wound impact your activities of daily livingo Sleep: No Bathing: No Appetite: No Relationship With Others: No Bladder Continence: No Emotions: No Bowel Continence: No Work: No Toileting: No Drive: No Dressing: No Hobbies: No Electronic Signature(s) Signed: 12/22/2022 6:29:44 PM By: Bill Pilling RN, [name] Entered By: [name] on 12/22/2022 09:24:45 [name] ([id number]) [id number] Page 6 of 6 -------------------------------------------------------------------------------- Patient/Caregiver Education Details Patient Name: Date of Service: [name] 1/8/2024andnbsp9:00 [name] Medical Record Number: [id number] Patient Account Number: [id number] Date of Birth/Gender: Treating RN: 1946-06-19 (77 y.o. [name] Primary Care Physician: [name] Other Clinician: Referring Physician: Treating Physician/Extender: [name], Jessica [name] Weeks in Treatment: 0 Education Assessment Education Provided To: Patient Education Topics Provided Hyperbaric Oxygenation: Handouts: Hyperbaric Oxygen Methods: Explain/Verbal, Printed Responses: Reinforcements needed Electronic Signature(s) Signed: 12/22/2022 6:29:44 PM By: Bill Pilling RN, [name] Entered By: [name] on 12/22/2022 09:50:12 -------------------------------------------------------------------------------- Vitals Details Patient Name: Date of Service: [place], [name]. 12/22/2022 9:00 [place] Record Number: [id number] Patient Account Number:  [id number] Date of Birth/Sex: Treating RN: 10-08-1946 (77 y.o. [name] Primary Care Kirt Chew: [name] Other Clinician: Referring Keyshun Elpers: Treating [place] Huntsman/Extender: [name], Jessica [name] Weeks in Treatment: 0 Vital Signs Time Taken: 09:20 Temperature (F): 97.7 Height (in): 71 Pulse (bpm): 83 Source: Stated Respiratory Rate (breaths/min): 20 Weight (lbs): 252 [name]: 155/65 Source: Stated Capillary [name]: 102 Body Mass Index (BMI): 35.1 Reference Range: 80 - 120 mg / dl Electronic Signature(s) Signed: 12/22/2022 6:29:44 PM By: Bill Pilling RN, [name] Entered By: [name] on 12/22/2022 09:22:33

## 2022-12-23 NOTE — Progress Notes (Signed)
[name], [name] ([id number]) [id number] Page 1 of 3 Visit Report for 12/22/2022 Abuse Risk Screen Details Patient Name: Date of Service: [name] 12/22/2022 9:00 A M Medical Record Number: [id number] Patient Account Number: [id number] Date of Birth/Sex: Treating RN: March 13, 1946 (77 y.o. [name] Primary Care Issabela Lesko: [name], [name] Other Clinician: Referring Lilyannah Zuelke: Treating Dent Plantz/Extender: [name], [name], [name] in Treatment: 0 Abuse Risk Screen Items Answer ABUSE RISK SCREEN: Has anyone close to you tried to hurt or harm you recentlyo No Do you feel uncomfortable with anyone in your familyo No Has anyone forced you do things that you didnt want to doo No Electronic Signature(s) Signed: 12/22/2022 6:29:44 PM By: Deon Pilling RN, BSN Entered By: Deon Pilling on 12/22/2022 09:22:56 -------------------------------------------------------------------------------- Activities of Daily Living Details Patient Name: Date of Service: [place], [name] 12/22/2022 9:00 [place] Record Number: [id number] Patient Account Number: [id number] Date of Birth/Sex: Treating RN: September 14, 1946 (77 y.o. [name] Primary Care Saranya Harlin: [name], [name] Other Clinician: Referring Skyann Ganim: Treating Eion Timbrook/Extender: [name], [name], [name] in Treatment: 0 Activities of Daily Living Items Answer Activities of Daily Living (Please select one for each item) Drive Automobile Not Able T Medications ake Completely Able Use T elephone Completely Able Care for Appearance Completely Able Use T oilet Completely Able Bath / Shower Completely Able Dress Self Completely Able Feed Self Completely Able Walk Need Assistance Get In / Out Bed Completely Able Housework Need Assistance Prepare Meals Completely [place] for Self Completely Able Electronic Signature(s) Signed: 12/22/2022  6:29:44 PM By: Deon Pilling RN, BSN Entered By: Deon Pilling on 12/22/2022 09:23:53 [place], [name] ([id number]) [id number] Page 2 of 3 -------------------------------------------------------------------------------- Education Screening Details Patient Name: Date of Service: Bill [place]. 12/22/2022 9:00 A M Medical Record Number: [id number] Patient Account Number: [id number] Date of Birth/Sex: Treating RN: 1945/12/26 (77 y.o. [name] Primary Care Oakley Kossman: [name], [name] Other Clinician: Referring Dann Galicia: Treating Anais Koenen/Extender: [name], [name], [name] in Treatment: 0 Primary Learner Assessed: Patient Learning Preferences/Education Level/Primary Language Learning Preference: Explanation, Demonstration Highest Education Level: High School Preferred Language: English Cognitive Barrier Language Barrier: No Translator Needed: No Memory Deficit: No Emotional Barrier: No Cultural/Religious Beliefs Affecting Medical Care: No Physical Barrier Impaired Vision: No Impaired Hearing: No Decreased Hand dexterity: No Knowledge/Comprehension Knowledge Level: High Comprehension Level: High Ability to understand written instructions: High Ability to understand verbal instructions: High Motivation Anxiety Level: Calm Cooperation: Cooperative Education Importance: Acknowledges Need Interest in Health Problems: Asks Questions Perception: Coherent Willingness to Engage in Self-Management High Activities: Readiness to Engage in Self-Management High Activities: Electronic Signature(s) Signed: 12/22/2022 6:29:44 PM By: Deon Pilling RN, BSN Entered By: Deon Pilling on 12/22/2022 09:24:12 -------------------------------------------------------------------------------- Fall Risk Assessment Details Patient Name: Date of Service: [place], [name] 12/22/2022 9:00 A M Medical Record Number: [id number] Patient Account  Number: [id number] Date of Birth/Sex: Treating RN: 07-20-1946 (77 y.o. [name] Primary Care Kapil Petropoulos: [name], [name] Other Clinician: Referring Abbas Beyene: Treating Nohemy Koop/Extender: [name], [name], [name] in Treatment: 0 Fall Risk Assessment Items Have you had 2 or more falls in the last 12 monthso 0 Yes [place], [place] ([id number]) [id number] Page 3 of 3 Have you had any fall that resulted in injury in the last 12 monthso 0 No FALLS RISK SCREEN History of falling - immediate or within 3 months 25 Yes Secondary diagnosis (Do you have  2 or more medical diagnoseso) 0 No Ambulatory aid None/bed rest/wheelchair/nurse 0 No Crutches/cane/walker 15 Yes Furniture 0 No Intravenous therapy Access/Saline/Heparin Lock 0 No Gait/Transferring Normal/ bed rest/ wheelchair 0 Yes Weak (short steps with or without shuffle, stooped but able to lift head while walking, may seek 0 No support from furniture) Impaired (short steps with shuffle, may have difficulty arising from chair, head down, impaired 0 No balance) Mental Status Oriented to own ability 0 Yes Electronic Signature(s) Signed: 12/22/2022 6:29:44 PM By: Deon Pilling RN, BSN Entered By: Deon Pilling on 12/22/2022 09:24:28 -------------------------------------------------------------------------------- Nutrition Risk Screening Details Patient Name: Date of Service: [name], [name] 12/22/2022 9:00 [place] Record Number: [id number] Patient Account Number: [id number] Date of Birth/Sex: Treating RN: 02/26/1946 (77 y.o. [name] Primary Care Camauri Fleece: [name], [name] Other Clinician: Referring Charly Hunton: Treating Gemma Ruan/Extender: [name], [name], [name] in Treatment: 0 Height (in): 71 Weight (lbs): 252 Body Mass Index (BMI): 35.1 Nutrition Risk Screening Items Score Screening NUTRITION RISK SCREEN: I have an illness or condition that made me change  the kind and/or amount of food I eat 2 Yes I eat fewer than two meals per day 0 No I eat few fruits and vegetables, or milk products 0 No I have three or more drinks of beer, liquor or wine almost every day 0 No I have tooth or mouth problems that make it hard for me to eat 0 No I don't always have enough money to buy the food I need 0 No I eat alone most of the time 0 No I take three or more different prescribed or over-the-counter drugs a day 1 Yes Without wanting to, I have lost or gained 10 pounds in the last six months 0 No I am not always physically able to shop, cook and/or feed myself 0 No Nutrition Protocols Good Risk Protocol Provide education on elevated blood Moderate Risk Protocol 0 sugars and impact on wound healing, as applicable High Risk Proctocol Risk Level: Moderate Risk Score: 3 Electronic Signature(s) Signed: 12/22/2022 6:29:44 PM By: Deon Pilling RN, BSN Entered By: Deon Pilling on 12/22/2022 09:24:35

## 2022-12-30 ENCOUNTER — Telehealth (HOSPITAL_BASED_OUTPATIENT_CLINIC_OR_DEPARTMENT_OTHER): Payer: Self-pay | Admitting: Cardiology

## 2022-12-30 NOTE — Telephone Encounter (Signed)
   Name: [name]  DOB: 26-May-1946  MRN: [id number]  Primary Cardiologist: Buford Dresser, MD  Chart reviewed as part of pre-operative protocol coverage. Because of [name]'s past medical history and time since last visit, he will require a follow-up in-office visit in order to better assess preoperative cardiovascular risk.  Pre-op covering staff: - Please schedule appointment and call patient to inform them. If patient already had an upcoming appointment within acceptable timeframe, please add "pre-op clearance" to the appointment notes so provider is aware. - Please contact requesting surgeon's office via preferred method (i.e, phone, fax) to inform them of need for appointment prior to surgery.  Patient is on Eliquis however no request made for holding medication.  Mable Fill, [name] Nestle, NP  12/30/2022, 5:06 PM

## 2022-12-30 NOTE — Telephone Encounter (Signed)
   Pre-operative Risk Assessment    Patient Name: [name]  DOB: 11/18/1946 MRN: [id number]      Request for Surgical Clearance    Procedure:   Patient will be in hyperbaric chamber  Date of Surgery:  Clearance TBD                                 Surgeon:  Dr. [name] Surgeon's Group or Practice Name:  [facility]  Phone number:  [phone]  Fax number:  [phone]   Type of Clearance Requested:   - Medical    Type of Anesthesia:  None    Additional requests/questions:   Caller wants medical clearance for the patient to do this procedure.  Caller stated the patient may need to be scheduled for a stress test.  Signed, Heloise Beecham   12/30/2022, 4:51 PM

## 2022-12-31 ENCOUNTER — Telehealth (HOSPITAL_BASED_OUTPATIENT_CLINIC_OR_DEPARTMENT_OTHER): Payer: Self-pay | Admitting: *Deleted

## 2022-12-31 ENCOUNTER — Encounter: Payer: Self-pay | Admitting: Podiatry

## 2022-12-31 ENCOUNTER — Ambulatory Visit: Payer: Medicare Other | Attending: General Practice | Admitting: General Practice

## 2022-12-31 ENCOUNTER — Ambulatory Visit (INDEPENDENT_AMBULATORY_CARE_PROVIDER_SITE_OTHER): Payer: Medicare Other | Admitting: Podiatry

## 2022-12-31 ENCOUNTER — Encounter: Payer: Self-pay | Admitting: General Practice

## 2022-12-31 VITALS — BP 128/72 | HR 85 | Ht 71.0 in | Wt 257.4 lb

## 2022-12-31 DIAGNOSIS — E1122 Type 2 diabetes mellitus with diabetic chronic kidney disease: Secondary | ICD-10-CM

## 2022-12-31 DIAGNOSIS — M79675 Pain in left toe(s): Secondary | ICD-10-CM

## 2022-12-31 DIAGNOSIS — Z951 Presence of aortocoronary bypass graft: Secondary | ICD-10-CM | POA: Diagnosis not present

## 2022-12-31 DIAGNOSIS — N183 Chronic kidney disease, stage 3 unspecified: Secondary | ICD-10-CM | POA: Diagnosis not present

## 2022-12-31 DIAGNOSIS — I480 Paroxysmal atrial fibrillation: Secondary | ICD-10-CM

## 2022-12-31 DIAGNOSIS — M21621 Bunionette of right foot: Secondary | ICD-10-CM

## 2022-12-31 DIAGNOSIS — Z01810 Encounter for preprocedural cardiovascular examination: Secondary | ICD-10-CM

## 2022-12-31 DIAGNOSIS — M216X9 Other acquired deformities of unspecified foot: Secondary | ICD-10-CM

## 2022-12-31 DIAGNOSIS — M79674 Pain in right toe(s): Secondary | ICD-10-CM

## 2022-12-31 DIAGNOSIS — I2510 Atherosclerotic heart disease of native coronary artery without angina pectoris: Secondary | ICD-10-CM | POA: Insufficient documentation

## 2022-12-31 DIAGNOSIS — I10 Essential (primary) hypertension: Secondary | ICD-10-CM | POA: Insufficient documentation

## 2022-12-31 DIAGNOSIS — Z794 Long term (current) use of insulin: Secondary | ICD-10-CM | POA: Diagnosis not present

## 2022-12-31 DIAGNOSIS — B351 Tinea unguium: Secondary | ICD-10-CM

## 2022-12-31 DIAGNOSIS — M21622 Bunionette of left foot: Secondary | ICD-10-CM | POA: Diagnosis not present

## 2022-12-31 MED ORDER — CICLOPIROX 8 % EX SOLN: Freq: Every day | CUTANEOUS | 0 refills | Status: DC

## 2022-12-31 NOTE — Patient Instructions (Signed)
Medication Instructions:  The current medical regimen is effective;  continue present plan and medications as directed. Please refer to the Current Medication list given to you today.  *If you need a refill on your cardiac medications before your next appointment, please call your pharmacy*  Lab Work: NONE If you have labs (blood work) drawn today and your tests are completely normal, you will receive your results only by: [place] (if you have MyChart) OR A paper copy in the mail If you have any lab test that is abnormal or we need to change your treatment, we will call you to review the results.  Testing/Procedures: NONE  Follow-Up: At [facility], you and your health needs are our priority.  As part of our continuing mission to provide you with exceptional heart care, we have created designated Provider Care Teams.  These Care Teams include your primary Cardiologist (physician) and Advanced Practice Providers (APPs -  Physician Assistants and Nurse Practitioners) who all work together to provide you with the care you need, when you need it.  Your next appointment:   SEPTEMBER   Provider:   Buford Dresser, MD     Other Instructions PLEASE READ AND FOLLOW ATTACHED  SALTY 6   INCREASE PHYSICAL ACTIVITY AS TOLERATED

## 2022-12-31 NOTE — Addendum Note (Signed)
Addended by: Waylan Rocher on: 12/31/2022 11:46 AM   Modules accepted: Orders

## 2022-12-31 NOTE — Telephone Encounter (Addendum)
Patient has already seen Coletta Memos, PA-C on 1/17 and been cleared for his procedure.

## 2022-12-31 NOTE — Progress Notes (Signed)
Cardiology Clinic Note   Patient Name: [name] Date of Encounter: 12/31/2022  Primary Care Provider:  [name], [name] G, MD Primary Cardiologist:  Buford Dresser, MD  Patient Profile    [name] presents to the clinic today for follow-up evaluation of his coronary artery disease, paroxysmal somal fibrillation, and preoperative cardiac evaluation.  Past Medical History    Past Medical History:  Diagnosis Date   A-fib ([place]) 03/04/2021   Anemia    Arthritis    Cancer (HCC)    prostate   Chronic kidney disease    blood in urine    Constipation    Coronary artery disease    Depression    Diabetes mellitus without complication ([place])    Difficult intubation    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 it was hard to get the tube down his throat   Dyspnea    Family history of breast cancer    Fatty liver    Fatty liver    Frequent headaches    GERD (gastroesophageal reflux disease)    History of chicken pox    History of fainting spells of unknown cause    History of prostate cancer    Hyperlipidemia    Hypertension    Myocardial infarction [facility])    Peripheral neuropathy    Pneumonia    Prostate cancer ([place])    PTSD (post-traumatic stress disorder)    Sleep apnea    uses Cpap   Past Surgical History:  Procedure Laterality Date   APPENDECTOMY     BIOPSY  01/15/2021   Procedure: BIOPSY;  Surgeon: Irving Copas., MD;  Location: [place];  Service: Gastroenterology;;   CARDIAC CATHETERIZATION  06/26/2017   CARDIAC SURGERY     Triple Bypass   CHOLECYSTECTOMY  2010   COLONOSCOPY     CORONARY ARTERY BYPASS GRAFT  2004   ESOPHAGOGASTRODUODENOSCOPY (EGD) WITH PROPOFOL N/A 01/15/2021   Procedure: ESOPHAGOGASTRODUODENOSCOPY (EGD) WITH PROPOFOL;  Surgeon: Irving Copas., MD;  Location: [facility] ENDOSCOPY;  Service: Gastroenterology;  Laterality: N/A;   EUS N/A 01/15/2021   Procedure: UPPER ENDOSCOPIC ULTRASOUND (EUS) RADIAL;  Surgeon:  Irving Copas., MD;  Location: [place];  Service: Gastroenterology;  Laterality: N/A;   EYE SURGERY Bilateral    cataract removal   FINE NEEDLE ASPIRATION  01/15/2021   Procedure: FINE NEEDLE ASPIRATION (FNA) LINEAR;  Surgeon: Irving Copas., MD;  [facility] ENDOSCOPY;  Service: Gastroenterology;;   LAPAROSCOPY N/A 07/01/2021   Procedure: STAGING LAPAROSCOPY;  Surgeon: Dwan Bolt, MD;  Location: [place];  Service: General;  Laterality: N/A;   PORTACATH PLACEMENT Right 02/21/2021   Procedure: INSERTION PORT-A-CATH;  Surgeon: Dwan Bolt, MD;  Location: [place];  Service: General;  Laterality: Right;   SPLENECTOMY, 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   Procedure: SPLENECTOMY;  Surgeon: Dwan Bolt, MD;  Location: [place];  Service: General;  Laterality: N/A;   TONSILLECTOMY  1958    Allergies  Allergies  Allergen Reactions   Keflex [Cephalexin] Other (See Comments)    Hallucinations?   Lisinopril Other (See Comments)    Unknown   Terazosin Other (See Comments)    Unknown    History of Present Illness    [name] has a PMH of paroxysmal atrial fibrillation, peripheral arterial disease, hyperlipidemia, OSA, coronary artery disease status post CABG x 4 in [place], cardiac catheterization 2016 and 2018 with patent grafts L-LAD, S-RCA, S-RI, cellulitis, pancreatic cancer, foot pain, and hyperlipidemia.  His  echocardiogram 3/22 showed an LVEF of 60 to 65%, intermediate diastolic parameters and no significant valvular abnormalities.  He was seen by Laurann Montana, NP 05/05/2022 for a HTN appointment.  He had been feeling well and tolerating his medications well.  Follow-up was planned for 4 months.  He was seen in follow-up by Dr. Harrell Gave on 09/08/2022.  He was accompanied by his wife.  He was doing well.  His medications were reviewed.  He did notice lower extremity swelling.  He reported that the swelling was gone in the morning.  He was staying active watering  his plants outside.  He had noted an improvement in his mobility since doing the door.  They were not doing any formal exercise since his wife had a fall back in 5/23.  He denied palpitations and chest pain.  He denied lightheadedness syncope orthopnea and PND.  He presents to the clinic today for follow-up evaluation and preoperative cardiac evaluation.  He states he will be seeing the podiatrist later today for his right foot.  He is also recovering from prostate cancer and has developed some cystitis.  He hopes that the hyperbaric chamber treatments will help with his healing.  He is not very physically active at home.  He checks his blood pressure regularly and notes that his blood pressures are running in the 332 systolic on his machine however, he has been told that his machine produces elevated results.  Initially in the clinic today his blood pressure is 142/72 and on recheck is 128/72.  I will have him continue to monitor his blood pressure, increase physical activity as tolerated, give the salty 6 diet sheet, have him continue to use lower extremity support stockings, and plan follow-up in September with Dr. Harrell Gave.  Today he denies chest pain, increased shortness of breath, lower extremity edema, fatigue, palpitations, melena, hematuria, hemoptysis, diaphoresis, weakness, presyncope, syncope, orthopnea, and PND.   Home Medications    Prior to Admission medications   Medication Sig Start Date End Date Taking? Authorizing Provider  acetaminophen (TYLENOL) 500 MG tablet Take 1,000 mg by mouth 2 (two) times daily.    [provider]  amLODipine (NORVASC) 5 MG tablet Take 1 tablet (5 mg total) by mouth daily. 02/10/22 10/31/22  Loel Dubonnet, NP  apixaban (ELIQUIS) 5 MG TABS tablet Take 5 mg by mouth 2 (two) times daily.    [provider]  atorvastatin (LIPITOR) 80 MG tablet Take 40 mg by mouth at bedtime. 09/22/19   [provider]  calcium-vitamin D (OSCAL  WITH D) 500-200 MG-UNIT tablet Take 1 tablet by mouth 2 (two) times daily.    [provider]  clotrimazole-betamethasone (LOTRISONE) cream Apply 1 application topically daily as needed. 11/13/21   [name], [name] G, MD  cycloSPORINE (RESTASIS) 0.05 % ophthalmic emulsion Place 1 drop into both eyes 2 (two) times daily as needed (dry eyes).    [provider]  ferrous sulfate 325 (65 FE) MG tablet Take 1 tablet (325 mg total) by mouth daily with breakfast. 11/13/21   [name], [name] G, MD  FLUoxetine (PROZAC) 20 MG capsule Take 40 mg by mouth daily.    [provider]  fluticasone (FLONASE) 50 MCG/ACT nasal spray Place 1 spray into both nostrils daily.    [provider]  furosemide (LASIX) 20 MG tablet Take 1 tablet (20 mg total) by mouth daily. 01/29/22 10/31/22  Swinyer, Lanice Schwab, NP  glucose blood (ONETOUCH VERIO) test strip Use to test blood sugars  1-2 times daily. 07/26/21   [name], [name] G, MD  hydrALAZINE (APRESOLINE) 25 MG tablet Take 1 tablet (25 mg total) by mouth 3 (three) times daily. 03/19/22   [name], Peter M, MD  insulin glargine (LANTUS) 100 UNIT/ML injection Inject 0.45 mLs (45 Units total) into the skin daily. 04/08/22   [name], [name] G, MD  insulin lispro (HUMALOG) 100 UNIT/ML injection Per sliding scale. 07/07/22   [name], [name] G, MD  lidocaine-prilocaine (EMLA) cream Apply 1 application. topically as needed.    [provider]  loratadine (CLARITIN) 10 MG tablet Take 10 mg by mouth daily as needed.    [provider]  Multiple Vitamin (MULTI-VITAMINS) TABS Take 1 tablet by mouth daily.    [provider]  nitroGLYCERIN (NITROSTAT) 0.3 MG SL tablet Place 0.3 mg under the tongue every 5 (five) minutes as needed for chest pain.    [provider]  nystatin powder Apply 1 application topically 3 (three) times daily. 11/13/21   [name], [name] G, MD  pantoprazole (PROTONIX) 40 MG tablet Take 1 tablet (40 mg total) by mouth  daily. 07/15/21   [name], [name] G, MD  Probiotic Product (PROBIOTIC ADVANCED PO) Take 1 capsule by mouth at bedtime.    [provider]  tamsulosin (FLOMAX) 0.4 MG CAPS capsule Take 0.8 mg by mouth at bedtime. 03/27/21   [provider]  traZODone (DESYREL) 100 MG tablet Take 100 mg by mouth at bedtime.    [provider]  valsartan (DIOVAN) 160 MG tablet Take 0.5 tablets (80 mg total) by mouth daily. 07/07/22   [name], [name] G, MD  vitamin B-12 (CYANOCOBALAMIN) 1000 MCG tablet Take 1,000 mcg by mouth daily.    [provider]  Wheat Dextrin (BENEFIBER DRINK MIX PO) Take by mouth daily.    [provider]    Family History    Family History  Problem Relation Age of Onset   Arthritis Mother    Heart disease Mother    Hyperlipidemia Mother    Hypertension Mother    Heart attack Mother    Breast cancer Sister        dx 21s   Heart attack Brother    Heart disease Brother    Depression Daughter    Arthritis Sister    Cancer Sister        unknown type, dx 32s   Cancer Brother    Learning disabilities Brother    Alcohol abuse Brother    COPD Brother    Heart attack Brother    Heart disease Brother    He indicated that his mother is deceased. He indicated that his father is deceased. He indicated that two of his three sisters are alive. He indicated that all of his three brothers are deceased. He indicated that his maternal grandmother is deceased. He indicated that his maternal grandfather is deceased. He indicated that his paternal grandmother is deceased. He indicated that his paternal grandfather is deceased. He indicated that his daughter is alive. He indicated that his son is alive.  Social History    Social History   Socioeconomic History   Marital status: Married    Spouse name: Not on file   Number of children: 2   Years of education: Not on file   Highest education level: Not on file  Occupational History   Occupation: retired   Tobacco Use   Smoking status: Former    Types: Cigarettes    Quit date: 12/14/1975  Years since quitting: 47.0   Smokeless tobacco: Never  Vaping Use   Vaping Use: Not on file  Substance and Sexual Activity   Alcohol use: Not Currently   Drug use: Never   Sexual activity: Not Currently  Other Topics Concern   Not on file  Social History Narrative   ** Merged History Encounter **       Social Determinants of Health   Financial Resource Strain: Low Risk  (12/16/2022)   Overall Financial Resource Strain (CARDIA)    Difficulty of Paying Living Expenses: Not hard at all  Food Insecurity: No Food Insecurity (12/16/2022)   Hunger Vital Sign    Worried About Running Out of Food in the Last Year: Never true    Ran Out of Food in the Last Year: Never true  Transportation Needs: No Transportation Needs (12/16/2022)   PRAPARE - Hydrologist (Medical): No    Lack of Transportation (Non-Medical): No  Physical Activity: Insufficiently Active (12/16/2022)   Exercise Vital Sign    Days of Exercise per Week: 2 days    Minutes of Exercise per Session: 60 min  Stress: No Stress Concern Present (12/16/2022)   [place]    Feeling of Stress : Not at all  Social Connections: [place] (12/16/2022)   Social Connection and Isolation Panel [NHANES]    Frequency of Communication with Friends and Family: More than three times a week    Frequency of Social Gatherings with Friends and Family: More than three times a week    Attends Religious Services: More than 4 times per year    Active Member of [employer] or Organizations: Yes    Attends Music therapist: More than 4 times per year    Marital Status: Married  Human resources officer Violence: Not At Risk (12/16/2022)   Humiliation, Afraid, Rape, and Kick questionnaire    Fear of Current or Ex-Partner: No    Emotionally Abused: No    Physically  Abused: No    Sexually Abused: No     Review of Systems    General:  No chills, fever, night sweats or weight changes.  Cardiovascular:  No chest pain, dyspnea on exertion, edema, orthopnea, palpitations, paroxysmal nocturnal dyspnea. Dermatological: No rash, lesions/masses Respiratory: No cough, dyspnea Urologic: No hematuria, dysuria Abdominal:   No nausea, vomiting, diarrhea, bright red blood per rectum, melena, or hematemesis Neurologic:  No visual changes, wkns, changes in mental status. All other systems reviewed and are otherwise negative except as noted above.  Physical Exam    VS:  BP 128/72   Pulse 85   Ht '5\' 11"'$  (1.803 m)   Wt 257 lb 6.4 oz (116.8 kg)   BMI 35.90 kg/m  , BMI Body mass index is 35.9 kg/m. GEN: Well nourished, well developed, in no acute distress. HEENT: normal. Neck: Supple, no JVD, carotid bruits, or masses. Cardiac: RRR, no murmurs, rubs, or gallops. No clubbing, cyanosis, generalized bilateral lower extremity nonpitting edema.  Radials/DP/PT 2+ and equal bilaterally.  Respiratory:  Respirations regular and unlabored, clear to auscultation bilaterally. GI: Soft, nontender, nondistended, BS + x 4. MS: no deformity or atrophy. Skin: warm and dry, no rash.  Chronic erythema right mid shin down Neuro:  Strength and sensation are intact. Psych: Normal affect.  Accessory Clinical Findings    Recent Labs: 12/12/2022: ALT 11; BUN 29; Creatinine 1.41; Hemoglobin 12.0; Platelet Count 248; Potassium 4.1; Sodium 138  Recent Lipid Panel    Component Value Date/Time   CHOL 141 02/21/2022 1156   CHOL 101 12/16/2018 1003   TRIG 140.0 02/21/2022 1156   HDL 46.80 02/21/2022 1156   HDL 38 (L) 12/16/2018 1003   CHOLHDL 3 02/21/2022 1156   VLDL 28.0 02/21/2022 1156   LDLCALC 66 02/21/2022 1156   LDLCALC 55 10/10/2020 0940         ECG personally reviewed by me today-normal sinus rhythm anterior septal infarct undetermined age 41 bpm- No acute  changes  Echocardiogram 03/04/2021  IMPRESSIONS     1. Left ventricular ejection fraction, by estimation, is 60 to 65%. The  left ventricle has normal function. The left ventricle has no regional  wall motion abnormalities. Left ventricular diastolic parameters are  indeterminate.   2. Right ventricular systolic function is normal. The right ventricular  size is normal. There is normal pulmonary artery systolic pressure.   3. The mitral valve is normal in structure. No evidence of mitral valve  regurgitation.   4. The aortic valve is normal in structure. Aortic valve regurgitation is  not visualized.   5. The inferior vena cava is normal in size with greater than 50%  respiratory variability, suggesting right atrial pressure of 3 mmHg.   FINDINGS   Left Ventricle: Left ventricular ejection fraction, by estimation, is 60  to 65%. The left ventricle has normal function. The left ventricle has no  regional wall motion abnormalities. The left ventricular internal cavity  size was normal in size. There is   no left ventricular hypertrophy. Left ventricular diastolic parameters  are indeterminate.   Right Ventricle: The right ventricular size is normal. Right vetricular  wall thickness was not assessed. Right ventricular systolic function is  normal. There is normal pulmonary artery systolic pressure. The tricuspid  regurgitant velocity is 2.28 m/s,  and with an assumed right atrial pressure of 3 mmHg, the estimated right  ventricular systolic pressure is 38.7 mmHg.   Left Atrium: Left atrial size was normal in size.   Right Atrium: Right atrial size was normal in size.   Pericardium: There is no evidence of pericardial effusion.   Mitral Valve: The mitral valve is normal in structure. No evidence of  mitral valve regurgitation.   Tricuspid Valve: The tricuspid valve is normal in structure. Tricuspid  valve regurgitation is trivial.   Aortic Valve: The aortic valve is normal  in structure. Aortic valve  regurgitation is not visualized.   Pulmonic Valve: The pulmonic valve was normal in structure. Pulmonic valve  regurgitation is not visualized.   Aorta: The aortic root and ascending aorta are structurally normal, with  no evidence of dilitation.   Venous: The inferior vena cava is normal in size with greater than 50%  respiratory variability, suggesting right atrial pressure of 3 mmHg.   IAS/Shunts: No atrial level shunt detected by color flow Doppler.    Assessment & Plan   1.  Coronary artery disease-no chest pain today.  Status post CABG 2004.  Echocardiogram 3/22 showed an EF of 60-65%, intermediate diastolic parameters and no significant valvular abnormalities. Continue atorvastatin, hydralazine, amlodipine Heart healthy low-sodium diet-salty 6 given Increase physical activity as tolerated  Essential hypertension-BP today 128/72 Continue amlodipine, valsartan, furosemide, hydralazine Heart healthy low-sodium diet-salty 6 given Increase physical activity as tolerated   Paroxysmal atrial fibrillation-EKG today shows normal sinus rhythm anteroseptal infarct undetermined age 80 bpm.  Reports compliance with apixaban and denies bleeding issues.  No recent episodes of  irregular or accelerated heartbeat. Continue apixaban Avoid triggers caffeine, chocolate, EtOH, dehydration etc.  Hyperlipidemia-LDL 66 on 02/21/22 Continue atorvastatin Heart healthy low-sodium high-fiber diet Increase physical activity as tolerated    Preoperative cardiac evaluation-[facility], Dr. [name]  Primary Cardiologist: Buford Dresser, MD  Chart reviewed as part of pre-operative protocol coverage. Given past medical history and time since last visit, based on ACC/AHA guidelines, Talvin Christianson would be at acceptable risk for the planned procedure without further cardiovascular testing.   Patient was advised that if  he develops new symptoms prior to surgery to contact our office to arrange a follow-up appointment.  He verbalized understanding.  His RCRI is class IV risk, 11% risk of major cardiac event.  He is able to complete greater than 4 METS of physical activity.  I will route this recommendation to the requesting party via Epic fax function and remove from pre-op pool.    Disposition: Follow-up with Dr. Harrell Gave in September.   Jossie Ng. Keeana Pieratt NP-C     12/31/2022, 9:26 AM [place] [address] Office [phone] Fax [phone]    I spent 14 minutes examining this patient, reviewing medications, and using patient centered shared decision making involving her cardiac care.  Prior to her visit I spent greater than 20 minutes reviewing her past medical history,  medications, and prior cardiac tests.

## 2022-12-31 NOTE — Progress Notes (Signed)
  Subjective:  Patient ID: [name], male    DOB: Jul 30, 1946,  MRN: [id number]  Chief Complaint  Patient presents with   Diabetes    NP  diabetic foot care   Bunions    Tailors' bunion right with painful callus    77 y.o. male presents with the above complaint. History confirmed with patient.  He has had a spot under the fifth toe on the right side that has been painful and always developed a callus and had a sore that is healed over mostly.  He has the same on his left foot but it is not painful.  His nails are thickened elongated and causing discomfort.  His A1c is well-controlled always in the mid 6%.  Objective:  Physical Exam: warm, good capillary refill, no trophic changes or ulcerative lesions, normal DP and PT pulses, normal monofilament exam, and normal sensory exam. Left Foot: bunionette deformity noted and dystrophic yellowed discolored nail plates with subungual debris Right Foot: bunionette deformity noted, dystrophic yellowed discolored nail plates with subungual debris, and preulcerative callus fifth metatarsal sub-  Assessment:   1. Tailor's bunion of both feet   2. Plantar flexed metatarsal, unspecified laterality   3. Type 2 diabetes mellitus with stage 3 chronic kidney disease, with long-term current use of insulin, unspecified whether stage 3a or 3b CKD (HCC)   4. Pain due to onychomycosis of toenails of both feet      Plan:  Patient was evaluated and treated and all questions answered.  Patient educated on diabetes. Discussed proper diabetic foot care and discussed risks and complications of disease. Educated patient in depth on reasons to return to the office immediately should he/she discover anything concerning or new on the feet. All questions answered. Discussed proper shoes as well.  I think he would benefit greatly from a custom molded foot orthosis and extra-depth diabetic shoe to offload and alleviate the preulcerative lesion.  He will be  fitted for these.  Discussed the etiology and treatment options for the condition in detail with the patient. Educated patient on the topical and oral treatment options for mycotic nails. Recommended debridement of the nails today. Sharp and mechanical debridement performed of all painful and mycotic nails today. Nails debrided in length and thickness using a nail nipper to level of comfort. Discussed treatment options including appropriate shoe gear. Follow up as needed for painful nails.  Rx [place] sent to [facility]     Return in about 3 months (around 04/01/2023) for at risk diabetic foot care.

## 2022-12-31 NOTE — Telephone Encounter (Signed)
Dr Harrell Gave received clearance for hyperbaric oxygen therapy from [facility]  Per Dr Harrell Gave there is no specific cardiac contraindication to hyperbaric oxygen therapy based on guidelines  Faxed note she wrote out and signed to [phone], confirmation received

## 2023-01-05 ENCOUNTER — Encounter (HOSPITAL_BASED_OUTPATIENT_CLINIC_OR_DEPARTMENT_OTHER): Payer: Medicare Other | Admitting: Internal Medicine

## 2023-01-05 DIAGNOSIS — Z8546 Personal history of malignant neoplasm of prostate: Secondary | ICD-10-CM | POA: Diagnosis not present

## 2023-01-05 DIAGNOSIS — E1122 Type 2 diabetes mellitus with diabetic chronic kidney disease: Secondary | ICD-10-CM | POA: Diagnosis not present

## 2023-01-05 DIAGNOSIS — N189 Chronic kidney disease, unspecified: Secondary | ICD-10-CM | POA: Diagnosis not present

## 2023-01-05 DIAGNOSIS — N3041 Irradiation cystitis with hematuria: Secondary | ICD-10-CM | POA: Diagnosis not present

## 2023-01-05 DIAGNOSIS — C61 Malignant neoplasm of prostate: Secondary | ICD-10-CM | POA: Diagnosis not present

## 2023-01-05 DIAGNOSIS — I10 Essential (primary) hypertension: Secondary | ICD-10-CM

## 2023-01-05 DIAGNOSIS — I129 Hypertensive chronic kidney disease with stage 1 through stage 4 chronic kidney disease, or unspecified chronic kidney disease: Secondary | ICD-10-CM | POA: Diagnosis not present

## 2023-01-05 DIAGNOSIS — E1121 Type 2 diabetes mellitus with diabetic nephropathy: Secondary | ICD-10-CM

## 2023-01-05 LAB — GLUCOSE, CAPILLARY
Glucose-Capillary: 179 mg/dL — ABNORMAL HIGH (ref 70–99)
Glucose-Capillary: 205 mg/dL — ABNORMAL HIGH (ref 70–99)

## 2023-01-05 NOTE — Progress Notes (Addendum)
[name], [name] ([id number]) [id number] Page 1 of 2 Visit Report for 01/05/2023 HBO Details Patient Name: Date of Service: [name] 01/05/2023 8:00 A M Medical Record Number: [id number] Patient Account Number: [id number] Date of Birth/Sex: Treating RN: 1946-08-01 (77 y.o. [name], [name] Primary Care [name]: [name], [name] Other Clinician: Donavan [name] Referring [name]: Treating [name]/Extender: [name], [name], [name] [name] in Treatment: 2 HBO Treatment Course Details Treatment Course Number: 1 Ordering [name]: [name] T Treatments Ordered: otal 40 HBO Treatment Start Date: 01/05/2023 HBO Indication: Late Effect of Radiation HBO Treatment Details Treatment Number: 1 Patient Type: Outpatient Chamber Type: Monoplace Chamber Serial #: [id number] Treatment Protocol: 2.5 ATA with 90 minutes oxygen, with two 5 minute air breaks Treatment Details Compression Rate Down: 1.0 psi / minute De-Compression Rate Up: 1.5 psi / minute A breaks and breathing ir Compress Tx Pressure periods Decompress Decompress Begins Reached (leave unused spaces Begins Ends blank) Chamber Pressure (ATA 1 2.5 2.5 2.5 2.5 2.5 - - 2.5 1 ) Clock Time (24 hr) 08:07 08:31 09:01 09:06 09:36 09:41 - - 10:11 10:25 Treatment Length: 138 (minutes) Treatment Segments: 5 Vital Signs Capillary [name] Reference Range: 80 - 120 mg / dl HBO Diabetic [name] Intervention Range: <131 mg/dl or >249 mg/dl Type: Time Vitals [name] Respiratory Capillary [name] Pulse Action Pulse: Temperature: Taken: Pressure: Rate: Glucose (mg/dl): Meter #: Oximetry (%) Taken: Pre 07:58 124/66 85 18 98.1 179 1 none per protocol Post 10:28 164/82 79 18 97.6 205 1 none per protocol Treatment Response Treatment Toleration: Well Treatment Completion Status: Treatment Completed without Adverse Event Treatment Notes Patient safely placed in chamber after  performing safety check. Chamber compressed at 1 psi/min until reaching 17psig at which point patient confirmed no issues with ear equalization and rate set was increased to 1.5 psi/min. Patient tolerated treatment well. Chamber decompressed at 1.5 psi/min. Patient denied any issues with ear equalization and/or pain. Patient was stable upon discharge. Physician HBO Attestation: I certify that I supervised this HBO treatment in accordance with Medicare guidelines. A trained emergency response team is readily available per Yes hospital policies and procedures. Continue HBOT as ordered. Yes Electronic Signature(s) Signed: 01/06/2023 10:49:17 AM By: [name] Shan DO Previous Signature: 01/05/2023 4:09:25 PM Version By: [name] CHT EMT BS , , Previous Signature: 01/05/2023 3:56:23 PM Version By: [name] CHT EMT BS , , Entered By: [name] on 01/05/2023 [name], [name] ([id number]) [id number] Page 2 of 2 -------------------------------------------------------------------------------- HBO Safety Checklist Details Patient Name: Date of Service: [name] 01/05/2023 8:00 A M Medical Record Number: [id number] Patient Account Number: [id number] Date of Birth/Sex: Treating RN: February 05, 1946 (77 y.o. [name], [name] Primary Care [name]: [name], [name] Other Clinician: Donavan [name] Referring [name]: Treating [name]/Extender: [name], [name], [name] [name] in Treatment: 2 HBO Safety Checklist Items Safety Checklist Consent Form Signed Patient voided / foley secured and emptied When did you last eato 0630 Last dose of injectable or oral agent Yesterday Ostomy pouch emptied and vented if applicable NA All implantable devices assessed, documented and approved Bard PowerPort ClearVUE Intravenous access site secured and place NA Valuables secured Linens and cotton and cotton/polyester blend (less than 51%  polyester) Personal oil-based products / skin lotions / body lotions removed Wigs or hairpieces removed NA Smoking or tobacco materials removed NA Books / newspapers / magazines / loose paper removed Cologne, aftershave, perfume and deodorant removed Jewelry removed (may wrap wedding band) Make-up  removed NA Hair care products removed Battery operated devices (external) removed Heating patches and chemical warmers removed Titanium eyewear removed Nail polish cured greater than 10 hours NA Casting material cured greater than 10 hours NA Hearing aids removed NA Loose dentures or partials removed NA Prosthetics have been removed NA Patient demonstrates correct use of air break device (if applicable) Patient concerns have been addressed Patient grounding bracelet on and cord attached to chamber Specifics for Inpatients (complete in addition to above) Medication sheet sent with patient NA Intravenous medications needed or due during therapy sent with patient NA Drainage tubes (e.g. nasogastric tube or chest tube secured and vented) NA Endotracheal or Tracheotomy tube secured NA Cuff deflated of air and inflated with saline NA Airway suctioned NA Notes Paper version used prior to treatment. Electronic Signature(s) Signed: 01/05/2023 3:50:31 PM By: [name] CHT EMT BS , , Entered By: [name] on 01/05/2023 15:50:30

## 2023-01-05 NOTE — Progress Notes (Signed)
[name], BYRNS ([id number]) [id number] Page 1 of 2 Visit Report for 01/05/2023 Arrival Information Details Patient Name: Date of Service: [name] 01/05/2023 8:00 A M Medical Record Number: [id number] Patient Account Number: [id number] Date of Birth/Sex: Treating RN: Mar 31, 1946 (77 y.o. [name] Primary Care Weaver Tweed: [name], [name] Other Clinician: [name] Referring Dede Dobesh: Treating Can Lucci/Extender: [name], [name], [name] in Treatment: 2 Visit Information History Since Last Visit All ordered tests and consults were completed: Yes Patient Arrived: Cane Added or deleted any medications: No Arrival Time: 07:32 Any new allergies or adverse reactions: No Accompanied By: self Had a fall or experienced change in No Transfer Assistance: None activities of daily living that may affect Patient Identification Verified: Yes risk of falls: Secondary Verification Process Completed: Yes Signs or symptoms of abuse/neglect since last visito No Patient Requires Transmission-Based Precautions: No Hospitalized since last visit: No Patient Has Alerts: Yes Implantable device outside of the clinic excluding No Patient Alerts: Patient on [name] cellular tissue based products placed in the center since last visit: Pain Present Now: No Electronic Signature(s) Signed: 01/05/2023 3:48:22 PM By: [name] , , Entered By: [name] on 01/05/2023 15:48:21 -------------------------------------------------------------------------------- Encounter Discharge Information Details Patient Name: Date of Service: [name], [name] 01/05/2023 8:00 A M Medical Record Number: [id number] Patient Account Number: [id number] Date of Birth/Sex: Treating RN: [birth year] (77 y.o. [name] Primary Care Cynai Skeens: [name], [name] Other Clinician: [name] Referring Darneshia Demary: Treating  Lakia Gritton/Extender: [name], [name], [name] in Treatment: 2 Encounter Discharge Information Items Discharge Condition: Stable Ambulatory Status: Cane Discharge Destination: Home Transportation: Private Auto Accompanied By: self Schedule Follow-up Appointment: No Clinical Summary of Care: Electronic Signature(s) Signed: 01/05/2023 3:57:40 PM By: [name] , , Entered By: [name] on 01/05/2023 15:57:40 [place], [name] ([id number]) [id number] Page 2 of 2 -------------------------------------------------------------------------------- Vitals Details Patient Name: Date of Service: [name] 01/05/2023 8:00 A M Medical Record Number: [id number] Patient Account Number: [id number] Date of Birth/Sex: Treating RN: 1946/08/28 (77 y.o. [name] Primary Care [name]: [name], [name] Other Clinician: [name] Referring Valencia Kassa: Treating Verlyn Lambert/Extender: [name], [name], [name] in Treatment: 2 Vital Signs Time Taken: 07:58 Temperature (F): 98.1 Height (in): 71 Pulse (bpm): 85 Weight (lbs): 252 Respiratory Rate (breaths/min): 18 Body Mass Index (BMI): 35.1 [name]: 124/66 Capillary [name]: 179 Reference Range: 80 - 120 mg / dl Electronic Signature(s) Signed: 01/05/2023 3:48:47 PM By: [name] , , Entered By: [name] on 01/05/2023 15:48:47

## 2023-01-06 ENCOUNTER — Encounter (HOSPITAL_BASED_OUTPATIENT_CLINIC_OR_DEPARTMENT_OTHER): Payer: Medicare Other | Admitting: Internal Medicine

## 2023-01-06 DIAGNOSIS — N189 Chronic kidney disease, unspecified: Secondary | ICD-10-CM | POA: Diagnosis not present

## 2023-01-06 DIAGNOSIS — N3041 Irradiation cystitis with hematuria: Secondary | ICD-10-CM | POA: Diagnosis not present

## 2023-01-06 DIAGNOSIS — C61 Malignant neoplasm of prostate: Secondary | ICD-10-CM | POA: Diagnosis not present

## 2023-01-06 DIAGNOSIS — E1121 Type 2 diabetes mellitus with diabetic nephropathy: Secondary | ICD-10-CM | POA: Diagnosis not present

## 2023-01-06 DIAGNOSIS — I129 Hypertensive chronic kidney disease with stage 1 through stage 4 chronic kidney disease, or unspecified chronic kidney disease: Secondary | ICD-10-CM | POA: Diagnosis not present

## 2023-01-06 DIAGNOSIS — E1122 Type 2 diabetes mellitus with diabetic chronic kidney disease: Secondary | ICD-10-CM | POA: Diagnosis not present

## 2023-01-06 DIAGNOSIS — Z8546 Personal history of malignant neoplasm of prostate: Secondary | ICD-10-CM | POA: Diagnosis not present

## 2023-01-06 LAB — GLUCOSE, CAPILLARY
Glucose-Capillary: 180 mg/dL — ABNORMAL HIGH (ref 70–99)
Glucose-Capillary: 228 mg/dL — ABNORMAL HIGH (ref 70–99)

## 2023-01-06 NOTE — Progress Notes (Signed)
ADRIENNE, DELAY ([id number]) [id number] Page 1 of 1 Visit Report for 01/06/2023 [name] Details Patient Name: Date of Service: [name]. 01/06/2023 Medical Record Number: [id number] Patient Account Number: [id number] Date of Birth/Sex: Treating RN: [birth year] (77 y.o. 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

## 2023-01-06 NOTE — Progress Notes (Signed)
[name], [name] ([id number]) [id number] Page 1 of 2 Visit Report for 01/06/2023 Arrival Information Details Patient Name: Date of Service: [name] 01/06/2023 8:00 A M Medical Record Number: [id number] Patient Account Number: [id number] Date of Birth/Sex: Treating RN: 06/17/1946 (77 y.o. [name] Primary Care [name]: [name] Other Clinician: [name] Referring Sherlyn Ebbert: Treating [name]/Extender: [name], Jessica [name] Weeks in Treatment: 2 Visit Information History Since Last Visit All ordered tests and consults were completed: Yes Patient Arrived: [name] Added or deleted any medications: No Arrival Time: 07:32 Any new allergies or adverse reactions: No Accompanied By: self Had a fall or experienced change in No Transfer Assistance: None activities of daily living that may affect Patient Identification Verified: Yes risk of falls: Secondary Verification Process Completed: Yes Signs or symptoms of abuse/neglect since last visito No Patient Requires Transmission-Based Precautions: No Hospitalized since last visit: No Patient Has Alerts: Yes Implantable device outside of the clinic excluding No Patient Alerts: Patient on [name] cellular tissue based products placed in the center since last visit: Pain Present Now: No Electronic Signature(s) Signed: 01/06/2023 2:14:10 PM By: [name] , , Entered By: [name] on 01/06/2023 14:14:10 -------------------------------------------------------------------------------- Encounter Discharge Information Details Patient Name: Date of Service: Bill Paganini, [name] 01/06/2023 8:00 A M Medical Record Number: [id number] Patient Account Number: [id number] Date of Birth/Sex: Treating RN: August 20, 1946 (77 y.o. [name] Primary Care Mendy Lapinsky: [name] Other Clinician: [name] Referring Chestina Komatsu: Treating  [name]/Extender: [name], Jessica [name] Weeks in Treatment: 2 Encounter Discharge Information Items Discharge Condition: Stable Ambulatory Status: Cane Discharge Destination: Home Transportation: Private Auto Accompanied By: self Schedule Follow-up Appointment: No Clinical Summary of Care: Electronic Signature(s) Signed: 01/06/2023 3:01:17 PM By: [name] , , Entered By: [name] on 01/06/2023 15:01:17 [name] ([id number]) [id number] Page 2 of 2 -------------------------------------------------------------------------------- Vitals Details Patient Name: Date of Service: [name] 01/06/2023 8:00 A M Medical Record Number: [id number] Patient Account Number: [id number] Date of Birth/Sex: Treating RN: 12-Apr-1946 (77 y.o. [name] Primary Care Kirsi Hugh: [name] Other Clinician: [name] Referring Ayannah Faddis: Treating [name]/Extender: [name], Jessica [name] Weeks in Treatment: 2 Vital Signs Time Taken: 07:42 Temperature (F): 97.7 Height (in): 71 Pulse (bpm): 83 Weight (lbs): 252 Respiratory Rate (breaths/min): 18 Body Mass Index (BMI): 35.1 [name]: 125/62 Capillary [name]: 180 Reference Range: 80 - 120 mg / dl Electronic Signature(s) Signed: 01/06/2023 2:14:38 PM By: [name] , , Entered By: [name] on 01/06/2023 14:14:37

## 2023-01-06 NOTE — Progress Notes (Signed)
DABID, GODOWN ([id number]) [id number] Page 1 of 1 Visit Report for 01/05/2023 [name] Details Patient Name: Date of Service: [name]. 01/05/2023 Medical Record Number: [id number] Patient Account Number: [id number] Date of Birth/Sex: Treating RN: February 11, 1946 (77 y.o. 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 neoplasm of prostate 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

## 2023-01-06 NOTE — Progress Notes (Addendum)
[name], [name] ([id number]) [id number] Page 1 of 2 Visit Report for 01/06/2023 HBO Details Patient Name: Date of Service: [name] 01/06/2023 8:00 A M Medical Record Number: [id number] Patient Account Number: [id number] Date of Birth/Sex: Treating RN: 1946-11-18 (77 y.o. [name] Primary Care Eduar Kumpf: [name], [name] Other Clinician: [name] Referring Ekaterini Capitano: Treating Denetra Formoso/Extender: [name], [name], [name] in Treatment: 2 HBO Treatment Course Details Treatment Course Number: 1 Ordering Trystian Crisanto: [name] T Treatments Ordered: otal 40 HBO Treatment Start Date: 01/05/2023 HBO Indication: Late Effect of Radiation HBO Treatment Details Treatment Number: 2 Patient Type: Outpatient Chamber Type: Monoplace Chamber Serial #: [id number] Treatment Protocol: 2.5 ATA with 90 minutes oxygen, with two 5 minute air breaks Treatment Details Compression Rate Down: 1.5 psi / minute De-Compression Rate Up: 1.5 psi / minute A breaks and breathing ir Compress Tx Pressure periods Decompress Decompress Begins Reached (leave unused spaces Begins Ends blank) Chamber Pressure (ATA 1 2.5 2.5 2.5 2.5 2.5 - - 2.5 1 ) Clock Time (24 hr) 08:00 08:17 08:47 08:52 09:22 09:27 - - 09:57 10:11 Treatment Length: 131 (minutes) Treatment Segments: 4 Vital Signs Capillary [name] Reference Range: 80 - 120 mg / dl HBO Diabetic [name] Intervention Range: <131 mg/dl or >249 mg/dl Type: Time Vitals [name] Respiratory Capillary [name] Pulse Action Pulse: Temperature: Taken: Pressure: Rate: Glucose (mg/dl): Meter #: Oximetry (%) Taken: Pre 07:42 125/62 83 18 97.7 180 1 none per protocol Post 10:15 160/80 83 18 98.7 228 1 none per protocol Treatment Response Treatment Toleration: Well Treatment Completion Status: Treatment Completed without Adverse Event Treatment Notes Patient safely placed in chamber after  performing safety check. Chamber compressed at 1 psi/min until reaching 8 psig at which point patient confirmed no issues with ear equalization and rate set was increased to 1.5 psi/min. Patient tolerated treatment well. Chamber decompressed at 1.5 psi/min. Patient denied any issues with ear equalization and/or pain. Patient was stable upon discharge. Physician HBO Attestation: I certify that I supervised this HBO treatment in accordance with Medicare guidelines. A trained emergency response team is readily available per Yes hospital policies and procedures. Continue HBOT as ordered. Yes Electronic Signature(s) Signed: 01/06/2023 4:21:39 PM By: [name] DO Previous Signature: 01/06/2023 3:00:19 PM Version By: [name] , , Entered By: [name] on 01/06/2023 15:55:30 [name] ([id number]) [id number] Page 2 of 2 -------------------------------------------------------------------------------- HBO Safety Checklist Details Patient Name: Date of Service: [name] 01/06/2023 8:00 A M Medical Record Number: [id number] Patient Account Number: [id number] Date of Birth/Sex: Treating RN: 01/11/1946 (77 y.o. [name] Primary Care Pao Haffey: [name], [name] Other Clinician: [name] Referring Dlisa Barnwell: Treating Lamon Rotundo/Extender: [name], [name], [name] in Treatment: 2 HBO Safety Checklist Items Safety Checklist Consent Form Signed Patient voided / foley secured and emptied When did you last eato 0645 Last dose of injectable or oral agent 0645 Ostomy pouch emptied and vented if applicable NA All implantable devices assessed, documented and approved Bard PowerPort ClearVUE Intravenous access site secured and place NA Valuables secured Linens and cotton and cotton/polyester blend (less than 51% polyester) Personal oil-based products / skin lotions / body lotions removed Wigs or  hairpieces removed NA Smoking or tobacco materials removed NA Books / newspapers / magazines / loose paper removed Cologne, aftershave, perfume and deodorant removed Jewelry removed (may wrap wedding band) Make-up removed NA Hair care products removed Battery operated devices (external) removed Heating patches  and chemical warmers removed Titanium eyewear removed Nail polish cured greater than 10 hours NA Casting material cured greater than 10 hours NA Hearing aids removed NA Loose dentures or partials removed NA Prosthetics have been removed NA Patient demonstrates correct use of air break device (if applicable) Patient concerns have been addressed Patient grounding bracelet on and cord attached to chamber Specifics for Inpatients (complete in addition to above) Medication sheet sent with patient NA Intravenous medications needed or due during therapy sent with patient NA Drainage tubes (e.g. nasogastric tube or chest tube secured and vented) NA Endotracheal or Tracheotomy tube secured NA Cuff deflated of air and inflated with saline NA Airway suctioned NA Notes Paper version used prior to treatment. Electronic Signature(s) Signed: 01/06/2023 2:16:15 PM By: [name] , , Entered By: [name] on 01/06/2023 14:16:15

## 2023-01-07 ENCOUNTER — Encounter (HOSPITAL_BASED_OUTPATIENT_CLINIC_OR_DEPARTMENT_OTHER): Payer: Medicare Other | Admitting: Physician Assistant

## 2023-01-07 ENCOUNTER — Ambulatory Visit (INDEPENDENT_AMBULATORY_CARE_PROVIDER_SITE_OTHER): Payer: Medicare Other

## 2023-01-07 DIAGNOSIS — N189 Chronic kidney disease, unspecified: Secondary | ICD-10-CM | POA: Diagnosis not present

## 2023-01-07 DIAGNOSIS — E1122 Type 2 diabetes mellitus with diabetic chronic kidney disease: Secondary | ICD-10-CM

## 2023-01-07 DIAGNOSIS — M21622 Bunionette of left foot: Secondary | ICD-10-CM

## 2023-01-07 DIAGNOSIS — I129 Hypertensive chronic kidney disease with stage 1 through stage 4 chronic kidney disease, or unspecified chronic kidney disease: Secondary | ICD-10-CM | POA: Diagnosis not present

## 2023-01-07 DIAGNOSIS — N3041 Irradiation cystitis with hematuria: Secondary | ICD-10-CM | POA: Diagnosis not present

## 2023-01-07 DIAGNOSIS — L598 Other specified disorders of the skin and subcutaneous tissue related to radiation: Secondary | ICD-10-CM | POA: Diagnosis not present

## 2023-01-07 DIAGNOSIS — M21621 Bunionette of right foot: Secondary | ICD-10-CM

## 2023-01-07 DIAGNOSIS — Z8546 Personal history of malignant neoplasm of prostate: Secondary | ICD-10-CM | POA: Diagnosis not present

## 2023-01-07 DIAGNOSIS — M216X9 Other acquired deformities of unspecified foot: Secondary | ICD-10-CM

## 2023-01-07 DIAGNOSIS — N183 Chronic kidney disease, stage 3 unspecified: Secondary | ICD-10-CM

## 2023-01-07 DIAGNOSIS — Z794 Long term (current) use of insulin: Secondary | ICD-10-CM

## 2023-01-07 LAB — GLUCOSE, CAPILLARY
Glucose-Capillary: 204 mg/dL — ABNORMAL HIGH (ref 70–99)
Glucose-Capillary: 203 mg/dL — ABNORMAL HIGH (ref 70–99)

## 2023-01-07 NOTE — Progress Notes (Signed)
Patient presents to the office today for diabetic shoe and insole measuring.  Patient was measured with brannock device to determine size and width for 1 pair of extra depth shoes and foam casted for 3 pair of insoles.   ABN signed.   Documentation of medical necessity will be sent to patient's treating diabetic doctor to verify and sign.   Patient's diabetic provider: [name], [name] G, MD   Shoes and insoles will be ordered at that time and patient will be notified for an appointment for fitting when they arrive.   Brannock measurement: 10.5 W  Patient shoe selection-   1st   Shoe choice:   [id number] BROOKS  2nd  Shoe choice:   [id number] NEW BALANCE  Shoe size ordered: 10.5 W

## 2023-01-07 NOTE — Progress Notes (Addendum)
ABHIJAY, MORRISS ([id number]) [id number] Page 1 of 2 Visit Report for 01/07/2023 Arrival Information Details Patient Name: Date of Service: [name] 01/07/2023 8:00 A M Medical Record Number: [id number] Patient Account Number: [id number] Date of Birth/Sex: Treating RN: 03-03-1946 (77 y.o. [name] Primary Care Juanitta Earnhardt: [name], [name] Other Clinician: [name] Referring Shellby Schlink: Treating Kady Toothaker/Extender: [name], [name] [name], [name] in Treatment: 2 Visit Information History Since Last Visit All ordered tests and consults were completed: Yes Patient Arrived: [name] Added or deleted any medications: No Arrival Time: 07:40 Any new allergies or adverse reactions: No Accompanied By: self Had a fall or experienced change in No Transfer Assistance: None activities of daily living that may affect Patient Identification Verified: Yes risk of falls: Secondary Verification Process Completed: Yes Signs or symptoms of abuse/neglect since last visito No Patient Requires Transmission-Based Precautions: No Hospitalized since last visit: No Patient Has Alerts: Yes Implantable device outside of the clinic excluding No Patient Alerts: Patient on [name] cellular tissue based products placed in the center since last visit: Pain Present Now: No Electronic Signature(s) Signed: 01/07/2023 9:35:06 AM By: Bill Burnet CHT EMT BS , , Entered By: Bill Burnet on 01/07/2023 09:35:06 -------------------------------------------------------------------------------- Encounter Discharge Information Details Patient Name: Date of Service: [facility], [name] 01/07/2023 8:00 Ramos Record Number: [id number] Patient Account Number: [id number] Date of Birth/Sex: Treating RN: [birth year] (77 y.o. [name] Primary Care Kathlean Cinco: [name], [name] Other Clinician: [name] Referring Youlanda Tomassetti: Treating  Donnavan [name]/Extender: [name], [name] [name], [name] in Treatment: 2 Encounter Discharge Information Items Discharge Condition: Stable Ambulatory Status: Cane Discharge Destination: Home Transportation: Private Auto Accompanied By: self Schedule Follow-up Appointment: No Clinical Summary of Care: Electronic Signature(s) Signed: 01/07/2023 10:49:04 AM By: Bill Burnet CHT EMT BS , , Entered By: Bill Burnet on 01/07/2023 10:49:04 [name] ([id number]) [id number] Page 2 of 2 -------------------------------------------------------------------------------- Vitals Details Patient Name: Date of Service: [name] 01/07/2023 8:00 A M Medical Record Number: [id number] Patient Account Number: [id number] Date of Birth/Sex: Treating RN: [birth year] (77 y.o. [name] Primary Care Charlii Yost: [name], [name] Other Clinician: [name] Referring Doss Cybulski: Treating Ekaterina Denise/Extender: [name], [name] [name], [name] in Treatment: 2 Vital Signs Time Taken: 07:40 Temperature (F): 97.6 Height (in): 71 Pulse (bpm): 84 Weight (lbs): 252 Respiratory Rate (breaths/min): 18 Body Mass Index (BMI): 35.1 [name]: 142/59 Capillary [name]: 204 Reference Range: 80 - 120 mg / dl Electronic Signature(s) Signed: 01/07/2023 9:35:37 AM By: Bill Burnet CHT EMT BS , , Entered By: Bill Burnet on 01/07/2023 09:35:37

## 2023-01-07 NOTE — Progress Notes (Signed)
[name], [name] ([id number]) [id number] Page 1 of 2 Visit Report for 01/07/2023 HBO Details Patient Name: Date of Service: [name] 01/07/2023 8:00 [name] Medical Record Number: [id number] Patient Account Number: [id number] Date of Birth/Sex: Treating RN: [birth year] (77 y.o. [name] Primary Care [name]: [name] Other Clinician: Donavan [name] Referring [name]: Treating [name]/Extender: [name] [name] Weeks in Treatment: 2 HBO Treatment Course Details Treatment Course Number: 1 Ordering [name]: [name] T Treatments Ordered: otal 40 HBO Treatment Start Date: 01/05/2023 HBO Indication: Late Effect of Radiation HBO Treatment Details Treatment Number: 3 Patient Type: Outpatient Chamber Type: Monoplace Chamber Serial #: [id number] Treatment Protocol: 2.5 ATA with 90 minutes oxygen, with two 5 minute air breaks Treatment Details Compression Rate Down: 1.5 psi / minute De-Compression Rate Up: 2.0 psi / minute Bill breaks and breathing ir Compress Tx Pressure periods Decompress Decompress Begins Reached (leave unused spaces Begins Ends blank) Chamber Pressure (ATA 1 2.5 2.5 2.5 2.5 2.5 - - 2.5 1 ) Clock Time (24 hr) 08:05 08:21 08:51 08:56 09:26 09:31 - - 10:01 10:12 Treatment Length: 127 (minutes) Treatment Segments: 4 Vital Signs Capillary [name] Reference Range: 80 - 120 mg / dl HBO Diabetic [name] Intervention Range: <131 mg/dl or >249 mg/dl Type: Time Vitals [name] Respiratory Capillary [name] Pulse Action Pulse: Temperature: Taken: Pressure: Rate: Glucose (mg/dl): Meter #: Oximetry (%) Taken: Pre 07:40 142/59 84 18 97.6 204 1 none per protocol Post 10:17 162/82 84 18 97.7 203 1 manual BP Treatment Response Treatment Toleration: Well Treatment Completion Status: Treatment Completed without Adverse Event Electronic Signature(s) Signed: 01/07/2023 10:47:24 AM By: [name] CHT EMT BS , , Signed: 01/07/2023 5:48:31 PM By: Worthy Keeler [name] Entered By: [name] on 01/07/2023 10:47:24 -------------------------------------------------------------------------------- HBO Safety Checklist Details Patient Name: Date of Service: [name], Bill Bill L. 01/07/2023 8:00 [name] Medical Record Number: [id number] Patient Account Number: [id number] Date of Birth/Sex: Treating RN: Apr 24, 1946 (77 y.o. [name] Primary Care [name]: [name] Other Clinician: Donavan [name] Referring [name]: Treating [name]/Extender: [name] [name] Weeks in Treatment: [name] ([id number]) [id number] Page 2 of 2 HBO Safety Checklist Items Safety Checklist Consent Form Signed Patient voided / foley secured and emptied When did you last eato 0615 Last dose of injectable or oral agent 0700 Ostomy pouch emptied and vented if applicable NA All implantable devices assessed, documented and approved Bard PowerPort ClearVUE Intravenous access site secured and place NA Valuables secured Linens and cotton and cotton/polyester blend (less than 51% polyester) Personal oil-based products / skin lotions / body lotions removed Wigs or hairpieces removed NA Smoking or tobacco materials removed NA Books / newspapers / magazines / loose paper removed Cologne, aftershave, perfume and deodorant removed Jewelry removed (may wrap wedding band) Make-up removed NA Hair care products removed Battery operated devices (external) removed Heating patches and chemical warmers removed Titanium eyewear removed Nail polish cured greater than 10 hours NA Casting material cured greater than 10 hours NA Hearing aids removed NA Loose dentures or partials removed NA Prosthetics have been removed NA Patient demonstrates correct use of air break device (if applicable) Patient concerns have been addressed Patient  grounding bracelet on and cord attached to chamber Specifics for Inpatients (complete in addition to above) Medication sheet sent with patient NA Intravenous medications needed or due during therapy sent with patient NA Drainage tubes (e.g. nasogastric tube or chest tube secured  and vented) NA Endotracheal or Tracheotomy tube secured NA Cuff deflated of air and inflated with saline NA Airway suctioned NA Notes Paper version used prior to treatment. Electronic Signature(s) Signed: 01/07/2023 10:41:44 AM By: [name] CHT EMT BS , , Entered By: [name] on 01/07/2023 10:41:44

## 2023-01-08 ENCOUNTER — Encounter (HOSPITAL_BASED_OUTPATIENT_CLINIC_OR_DEPARTMENT_OTHER): Payer: Medicare Other | Admitting: Internal Medicine

## 2023-01-08 ENCOUNTER — Telehealth: Payer: Self-pay | Admitting: Family Medicine

## 2023-01-08 DIAGNOSIS — Z8546 Personal history of malignant neoplasm of prostate: Secondary | ICD-10-CM | POA: Diagnosis not present

## 2023-01-08 DIAGNOSIS — C61 Malignant neoplasm of prostate: Secondary | ICD-10-CM

## 2023-01-08 DIAGNOSIS — N189 Chronic kidney disease, unspecified: Secondary | ICD-10-CM | POA: Diagnosis not present

## 2023-01-08 DIAGNOSIS — I129 Hypertensive chronic kidney disease with stage 1 through stage 4 chronic kidney disease, or unspecified chronic kidney disease: Secondary | ICD-10-CM | POA: Diagnosis not present

## 2023-01-08 DIAGNOSIS — N3041 Irradiation cystitis with hematuria: Secondary | ICD-10-CM | POA: Diagnosis not present

## 2023-01-08 DIAGNOSIS — E1122 Type 2 diabetes mellitus with diabetic chronic kidney disease: Secondary | ICD-10-CM | POA: Diagnosis not present

## 2023-01-08 DIAGNOSIS — E1121 Type 2 diabetes mellitus with diabetic nephropathy: Secondary | ICD-10-CM

## 2023-01-08 LAB — GLUCOSE, CAPILLARY
Glucose-Capillary: 257 mg/dL — ABNORMAL HIGH (ref 70–99)
Glucose-Capillary: 285 mg/dL — ABNORMAL HIGH (ref 70–99)

## 2023-01-08 NOTE — Progress Notes (Addendum)
[name], ROBINSON ([id number]) [id number] Page 1 of 2 Visit Report for 01/08/2023 Arrival Information Details Patient Name: Date of Service: [name] 01/08/2023 8:00 Bill M Medical Record Number: [id number] Patient Account Number: [id number] Date of Birth/Sex: Treating RN: [birth year] (77 y.o. [name] Primary Care Daviel Allegretto: [name] Other Clinician: [name] Referring Delayne Sanzo: Treating Moria Brophy/Extender: [name], Jessica [name] Weeks in Treatment: 2 Visit Information History Since Last Visit All ordered tests and consults were completed: Yes Patient Arrived: [name] Added or deleted any medications: No Arrival Time: 07:30 Any new allergies or adverse reactions: No Accompanied By: self Had Bill fall or experienced change in No Transfer Assistance: None activities of daily living that may affect Patient Identification Verified: Yes risk of falls: Patient Requires Transmission-Based Precautions: No Signs or symptoms of abuse/neglect since last visito No Patient Has Alerts: Yes Hospitalized since last visit: No Patient Alerts: Patient on [name] Implantable device outside of the clinic excluding No cellular tissue based products placed in the center since last visit: Pain Present Now: No Electronic Signature(s) Signed: 01/08/2023 9:19:36 AM By: [name] , , Entered By: [name] on 01/08/2023 09:19:36 -------------------------------------------------------------------------------- Encounter Discharge Information Details Patient Name: Date of Service: [name], [name] 01/08/2023 8:00 Bill M Medical Record Number: [id number] Patient Account Number: [id number] Date of Birth/Sex: Treating RN: 11-04-1946 (77 y.o. [name] Primary Care Charlsey Moragne: [name] Other Clinician: [name] Referring Ladonna Vanorder: Treating Starsha Morning/Extender: [name], Jessica [name] Weeks  in Treatment: 2 Encounter Discharge Information Items Discharge Condition: Stable Ambulatory Status: Cane Discharge Destination: Home Transportation: Private Auto Accompanied By: self Schedule Follow-up Appointment: No Clinical Summary of Care: Electronic Signature(s) Signed: 01/08/2023 3:25:11 PM By: [name] , , Entered By: [name] on 01/08/2023 15:25:11 [name] ([id number]) [id number] Page 2 of 2 -------------------------------------------------------------------------------- Vitals Details Patient Name: Date of Service: [name] 01/08/2023 8:00 Bill M Medical Record Number: [id number] Patient Account Number: [id number] Date of Birth/Sex: Treating RN: Apr 29, 1946 (77 y.o. [name] Primary Care Dericka Ostenson: [name] Other Clinician: [name] Referring Tawny Raspberry: Treating Audreyanna Butkiewicz/Extender: [name], Jessica [name] Weeks in Treatment: 2 Vital Signs Time Taken: 08:02 Temperature (F): 98.1 Height (in): 71 Pulse (bpm): 89 Weight (lbs): 252 Respiratory Rate (breaths/min): 18 Body Mass Index (BMI): 35.1 [name]: 119/66 Capillary [name]: 257 Reference Range: 80 - 120 mg / dl Electronic Signature(s) Signed: 01/08/2023 9:20:04 AM By: [name] , , Entered By: [name] on 01/08/2023 09:20:04

## 2023-01-08 NOTE — Progress Notes (Addendum)
DALTON, MOLESWORTH ([id number]) [id number] Page 1 of 2 Visit Report for 01/08/2023 HBO Details Patient Name: Date of Service: [name] 01/08/2023 8:00 A M Medical Record Number: [id number] Patient Account Number: [id number] Date of Birth/Sex: Treating RN: May 22, 1946 (77 y.o. [name] Primary Care Janasia Coverdale: [name], [name] Other Clinician: [name] Referring Jep Dyas: Treating Tysheka Fanguy/Extender: [name], [name], [name] in Treatment: 2 HBO Treatment Course Details Treatment Course Number: 1 Ordering Mareon Robinette: [name] T Treatments Ordered: otal 40 HBO Treatment Start Date: 01/05/2023 HBO Indication: Late Effect of Radiation HBO Treatment Details Treatment Number: 4 Patient Type: Outpatient Chamber Type: Monoplace Chamber Serial #: [id number] Treatment Protocol: 2.5 ATA with 90 minutes oxygen, with two 5 minute air breaks Treatment Details Compression Rate Down: 1.5 psi / minute De-Compression Rate Up: 2.0 psi / minute A breaks and breathing ir Compress Tx Pressure periods Decompress Decompress Begins Reached (leave unused spaces Begins Ends blank) Chamber Pressure (ATA 1 2.5 2.5 2.5 2.5 2.5 - - 2.5 1 ) Clock Time (24 hr) 08:09 08:21 08:51 08:56 09:26 09:31 - - 10:01 10:13 Treatment Length: 124 (minutes) Treatment Segments: 4 Vital Signs Capillary Blood Glucose Reference Range: 80 - 120 mg / dl HBO Diabetic Blood Glucose Intervention Range: <131 mg/dl or >249 mg/dl Type: Time Vitals Blood Pulse: Respiratory Temperature: Capillary Blood Glucose Pulse Action Taken: Pressure: Rate: Glucose (mg/dl): Meter #: Oximetry (%) Taken: Pre 08:02 119/66 89 18 98.1 257 1 Asymptomatic for hyperglycemia Post 10:19 142/81 87 18 97.9 285 1 patient asymptomatic for hyperglycemia Treatment Response Treatment Toleration: Well Treatment Completion Status: Treatment Completed without Adverse Event Treatment Notes Patient  arrived, glucose level measured at 257 mg/dL. Patient was asymptomatic for hyperglycemia, stated that he felt fine. Patient was safely placed in the chamber after safety check. Chamber was compressed at 1 psi/min until reaching 3 psig at which time patient verified that he was not having issues with ear equalization. Travel rate was increased to approximately 1.8 psi/min rate set until reaching treatment depth. Patient tolerated treatment and decompression of the chamber at approximately 2 psi/min. Patient denied any issues with ear equalization and/or pain. Patient's post-treatment blood glucose was 285 mg/dL and was asymptomatic for hyperglycemia. Patient was stable upon discharge and stated that he would go home (15 min away) and take his insulin. Electronic Signature(s) Signed: 01/08/2023 3:23:41 PM By: [name] , , Signed: 01/09/2023 11:11:08 AM By: [name] DO Entered By: [name] on 01/08/2023 15:23:40 [name] ([id number]) [id number] Page 2 of 2 -------------------------------------------------------------------------------- HBO Safety Checklist Details Patient Name: Date of Service: [name] 01/08/2023 8:00 A M Medical Record Number: [id number] Patient Account Number: [id number] Date of Birth/Sex: Treating RN: Apr 07, 1946 (77 y.o. [name] Primary Care Pierra Skora: [name], [name] Other Clinician: [name] Referring Rajan Burgard: Treating Jakevious Hollister/Extender: [name], [name], [name] in Treatment: 2 HBO Safety Checklist Items Safety Checklist Consent Form Signed Patient voided / foley secured and emptied When did you last eato 0615 Last dose of injectable or oral agent 0645 Ostomy pouch emptied and vented if applicable NA All implantable devices assessed, documented and approved Bard PowerPort ClearVUE Intravenous access site secured and place NA Valuables secured Linens and  cotton and cotton/polyester blend (less than 51% polyester) Personal oil-based products / skin lotions / body lotions removed Wigs or hairpieces removed NA Smoking or tobacco materials removed NA Books / newspapers / magazines / loose paper removed Cologne, aftershave, perfume  and deodorant removed Jewelry removed (may wrap wedding band) Make-up removed NA Hair care products removed Battery operated devices (external) removed Heating patches and chemical warmers removed Titanium eyewear removed Nail polish cured greater than 10 hours NA Casting material cured greater than 10 hours NA Hearing aids removed NA Loose dentures or partials removed NA Prosthetics have been removed NA Patient demonstrates correct use of air break device (if applicable) Patient concerns have been addressed Patient grounding bracelet on and cord attached to chamber Specifics for Inpatients (complete in addition to above) Medication sheet sent with patient NA Intravenous medications needed or due during therapy sent with patient NA Drainage tubes (e.g. nasogastric tube or chest tube secured and vented) NA Endotracheal or Tracheotomy tube secured NA Cuff deflated of air and inflated with saline NA Airway suctioned NA Notes Paper version used prior to treatment. Electronic Signature(s) Signed: 01/08/2023 3:14:40 PM By: [name] , , Previous Signature: 01/08/2023 9:21:10 AM Version By: [name] , , Entered By: [name] on 01/08/2023 15:14:40

## 2023-01-08 NOTE — Telephone Encounter (Signed)
Patient [facility] Dr Sherryle Lis. Ordered shoes and inserts. Said pcp needs to approve and they will send the info.

## 2023-01-08 NOTE — Progress Notes (Signed)
BREION, NOVACEK ([id number]) [id number] Page 1 of 1 Visit Report for 01/07/2023 [name] Details Patient Name: Date of Service: [name] 01/07/2023 Medical Record Number: [id number] Patient Account Number: [id number] Date of Birth/Sex: Treating RN: 07-Oct-1946 (77 y.o. 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 C61 Malignant neoplasm of prostate Facility Procedures CPT4 Code Description Modifier Quantity [id number] G0277-(Facility Use Only) HBOT full body chamber, 41mn , 4 ICD-10 Diagnosis Description N30.41 Irradiation cystitis with hematuria C61 Malignant neoplasm of prostate 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

## 2023-01-09 ENCOUNTER — Encounter (HOSPITAL_BASED_OUTPATIENT_CLINIC_OR_DEPARTMENT_OTHER): Payer: Medicare Other | Admitting: Internal Medicine

## 2023-01-09 DIAGNOSIS — C61 Malignant neoplasm of prostate: Secondary | ICD-10-CM

## 2023-01-09 DIAGNOSIS — E1122 Type 2 diabetes mellitus with diabetic chronic kidney disease: Secondary | ICD-10-CM | POA: Diagnosis not present

## 2023-01-09 DIAGNOSIS — Z8546 Personal history of malignant neoplasm of prostate: Secondary | ICD-10-CM | POA: Diagnosis not present

## 2023-01-09 DIAGNOSIS — N189 Chronic kidney disease, unspecified: Secondary | ICD-10-CM | POA: Diagnosis not present

## 2023-01-09 DIAGNOSIS — N3041 Irradiation cystitis with hematuria: Secondary | ICD-10-CM | POA: Diagnosis not present

## 2023-01-09 DIAGNOSIS — E1121 Type 2 diabetes mellitus with diabetic nephropathy: Secondary | ICD-10-CM

## 2023-01-09 DIAGNOSIS — I129 Hypertensive chronic kidney disease with stage 1 through stage 4 chronic kidney disease, or unspecified chronic kidney disease: Secondary | ICD-10-CM | POA: Diagnosis not present

## 2023-01-09 LAB — GLUCOSE, CAPILLARY: Glucose-Capillary: 280 mg/dL — ABNORMAL HIGH (ref 70–99)

## 2023-01-09 NOTE — Progress Notes (Addendum)
[name], [name] ([id number]) [id number] Page 1 of 2 Visit Report for 01/09/2023 Arrival Information Details Patient Name: Date of Service: [name] 01/09/2023 8:00 A M Medical Record Number: [id number] Patient Account Number: [id number] Date of Birth/Sex: Treating RN: January 23, 1946 (77 y.o. [name] Primary Care Fryda Molenda: [name], [name] Other Clinician: [name] Referring Keisean Skowron: Treating Belmont Valli/Extender: [name], [name], [name] in Treatment: 2 Visit Information History Since Last Visit All ordered tests and consults were completed: Yes Patient Arrived: [name] Added or deleted any medications: No Arrival Time: 07:30 Any new allergies or adverse reactions: No Accompanied By: self Had a fall or experienced change in No Transfer Assistance: None activities of daily living that may affect Patient Identification Verified: Yes risk of falls: Secondary Verification Process Completed: Yes Signs or symptoms of abuse/neglect since last visito No Patient Requires Transmission-Based Precautions: No Hospitalized since last visit: No Patient Has Alerts: Yes Implantable device outside of the clinic excluding No Patient Alerts: Patient on Blood Thinner cellular tissue based products placed in the center since last visit: Pain Present Now: No Electronic Signature(s) Signed: 01/09/2023 11:39:22 AM By: [name] , , Entered By: [name] on 01/09/2023 11:39:22 -------------------------------------------------------------------------------- Encounter Discharge Information Details Patient Name: Date of Service: [name], [name] 01/09/2023 8:00 A M Medical Record Number: [id number] Patient Account Number: [id number] Date of Birth/Sex: Treating RN: 1946-03-02 (77 y.o. [name] Primary Care Charde Macfarlane: [name], [name] Other Clinician: [name] Referring Lemar Bakos: Treating  Dayven Linsley/Extender: [name], [name], [name] in Treatment: 2 Encounter Discharge Information Items Discharge Condition: Stable Ambulatory Status: Cane Discharge Destination: Home Transportation: Private Auto Accompanied By: self Schedule Follow-up Appointment: No Clinical Summary of Care: Electronic Signature(s) Signed: 01/09/2023 12:00:51 PM By: [name] , , Entered By: [name] on 01/09/2023 12:00:51 [name] ([id number]) [id number] Page 2 of 2 -------------------------------------------------------------------------------- Vitals Details Patient Name: Date of Service: [name] 01/09/2023 8:00 A M Medical Record Number: [id number] Patient Account Number: [id number] Date of Birth/Sex: Treating RN: [birth year] (77 y.o. [name] Primary Care [name]: [name], [name] Other Clinician: [name] Referring Maricela Kawahara: Treating Norberta Stobaugh/Extender: [name], [name], [name] in Treatment: 2 Vital Signs Time Taken: 08:03 Temperature (F): 98.6 Height (in): 71 Pulse (bpm): 86 Weight (lbs): 252 Respiratory Rate (breaths/min): 20 Body Mass Index (BMI): 35.1 Blood Pressure (mmHg): 136/73 Capillary Blood Glucose (mg/dl): 208 Reference Range: 80 - 120 mg / dl Electronic Signature(s) Signed: 01/09/2023 11:39:59 AM By: [name] , , Entered By: [name] on 01/09/2023 11:39:59

## 2023-01-09 NOTE — Progress Notes (Signed)
LITTLETON, HAUB ([id number]) [id number] Page 1 of 1 Visit Report for 01/08/2023 [name] Details Patient Name: Date of Service: [place]. 01/08/2023 Medical Record Number: [id number] Patient Account Number: [id number] Date of Birth/Sex: Treating RN: [birth year] (77 y.o. 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 C61 Malignant neoplasm of prostate Facility Procedures CPT4 Code Description Modifier Quantity [id number] G0277-(Facility Use Only) HBOT full body chamber, 77mn , 4 ICD-10 Diagnosis Description N30.41 Irradiation cystitis with hematuria C61 Malignant neoplasm of prostate 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

## 2023-01-09 NOTE — Progress Notes (Signed)
[name] ([id number]) [id number] Page 1 of 2 Visit Report for 01/09/2023 HBO Details Patient Name: Date of Service: [name] 01/09/2023 8:00 A M Medical Record Number: [id number] Patient Account Number: [id number] Date of Birth/Sex: Treating RN: [birth year] (77 y.o. [name] Primary Care Rockie Vawter: [name], Bill Other Clinician: [name] Referring Daisean Brodhead: Treating Rawlin Reaume/Extender: [name], [name], [name]: 2 HBO Treatment Course Details Treatment Course Number: 1 Ordering Rajvi Armentor: [name] T Treatments Ordered: otal 40 HBO Treatment Start Date: 01/05/2023 HBO Indication: Late Effect of Radiation HBO Treatment Details Treatment Number: 5 Patient Type: Outpatient Chamber Type: Monoplace Chamber Serial #: [id number] Treatment Protocol: 2.5 ATA with 90 minutes oxygen, with two 5 minute air breaks Treatment Details Compression Rate Down: 1.5 psi / minute De-Compression Rate Up: 2.0 psi / minute A breaks and breathing ir Compress Tx Pressure periods Decompress Decompress Begins Reached (leave unused spaces Begins Ends blank) Chamber Pressure (ATA 1 2.5 2.5 2.5 2.5 2.5 - - 2.5 1 ) Clock Time (24 hr) 08:13 08:29 08:59 09:04 09:34 09:39 - - 10:09 10:21 Treatment Length: 128 (minutes) Treatment Segments: 4 Vital Signs Capillary [name] Reference Range: 80 - 120 mg / dl HBO Diabetic [name] Intervention Range: <131 mg/dl or >249 mg/dl Type: Time Vitals [name] Pulse: Respiratory Temperature: Capillary [name] Pulse Action Taken: Pressure: Rate: Glucose (mg/dl): Meter #: Oximetry (%) Taken: Pre 08:03 136/73 86 20 98.6 208 none per protocol Post 10:26 140/75 83 18 98.5 280 patient asymptomatic for hyperglycemia Treatment Response Treatment Toleration: Well Treatment Completion Status: Treatment Completed without Adverse Event Treatment Notes Patient arrived, [name]  glucose measured at 208 mg/dL. Patient prepared for treatment, and was placed in the chamber after performing safety check. Chamber was compressed at 1 psi/min until reaching 4 psig at which time patient confirmed that his ears were equalizing and rate set was increased to 2 psi/min. Patient tolerated treatment and chamber decompression at the rate of 2 psi/min. Post treatment [name] level was 280 mg/dL. Informed Dr. Heber [place] of this and patient stated he would go home and take his insulin. Patient was stable upon discharge. Physician HBO Attestation: I certify that I supervised this HBO treatment in accordance with Medicare guidelines. A trained emergency response team is readily available per Yes hospital policies and procedures. Continue HBOT as ordered. Yes Electronic Signature(s) Signed: 01/12/2023 11:52:55 AM By: [name] DO Previous Signature: 01/09/2023 12:00:05 PM Version By: [name] , , Previous Signature: 01/12/2023 11:13:35 AM Version By: [name] DO Entered By: [name] on 01/12/2023 11:14:13 [place], [name] ([id number]) [id number] Page 2 of 2 -------------------------------------------------------------------------------- HBO Safety Checklist Details Patient Name: Date of Service: [name] 01/09/2023 8:00 A M Medical Record Number: [id number] Patient Account Number: [id number] Date of Birth/Sex: Treating RN: 10-Jul-1946 (77 y.o. [name] Primary Care Mikell Camp: [name], Bill Other Clinician: [name] Referring [name]: Treating Shaine Newmark/Extender: [name], [name], [name]: 2 HBO Safety Checklist Items Safety Checklist Consent Form Signed Patient voided / foley secured and emptied When did you last eato 0615 Last dose of injectable or oral agent 0700 Ostomy pouch emptied and vented if applicable NA All implantable devices assessed,  documented and approved Bard PowerPort ClearVUE Intravenous access site secured and place NA Valuables secured Linens and cotton and cotton/polyester blend (less than 51% polyester) Personal oil-based products / skin lotions / body lotions removed Wigs or hairpieces removed NA  Smoking or tobacco materials removed NA Books / newspapers / magazines / loose paper removed Cologne, aftershave, perfume and deodorant removed Jewelry removed (may wrap wedding band) Make-up removed NA Hair care products removed Battery operated devices (external) removed Heating patches and chemical warmers removed Titanium eyewear removed Nail polish cured greater than 10 hours NA Casting material cured greater than 10 hours NA Hearing aids removed NA Loose dentures or partials removed NA Prosthetics have been removed NA Patient demonstrates correct use of air break device (if applicable) Patient concerns have been addressed Patient grounding bracelet on and cord attached to chamber Specifics for Inpatients (complete in addition to above) Medication sheet sent with patient NA Intravenous medications needed or due during therapy sent with patient NA Drainage tubes (e.g. nasogastric tube or chest tube secured and vented) NA Endotracheal or Tracheotomy tube secured NA Cuff deflated of air and inflated with saline NA Airway suctioned NA Notes Paper version used prior to treatment. Electronic Signature(s) Signed: 01/09/2023 11:41:01 AM By: [name] , , Entered By: [name] on 01/09/2023 11:41:01

## 2023-01-12 ENCOUNTER — Encounter (HOSPITAL_BASED_OUTPATIENT_CLINIC_OR_DEPARTMENT_OTHER): Payer: Medicare Other | Admitting: Internal Medicine

## 2023-01-12 DIAGNOSIS — N3041 Irradiation cystitis with hematuria: Secondary | ICD-10-CM | POA: Diagnosis not present

## 2023-01-12 DIAGNOSIS — Z8546 Personal history of malignant neoplasm of prostate: Secondary | ICD-10-CM | POA: Diagnosis not present

## 2023-01-12 DIAGNOSIS — E1121 Type 2 diabetes mellitus with diabetic nephropathy: Secondary | ICD-10-CM | POA: Diagnosis not present

## 2023-01-12 DIAGNOSIS — E1122 Type 2 diabetes mellitus with diabetic chronic kidney disease: Secondary | ICD-10-CM | POA: Diagnosis not present

## 2023-01-12 DIAGNOSIS — C61 Malignant neoplasm of prostate: Secondary | ICD-10-CM

## 2023-01-12 DIAGNOSIS — I129 Hypertensive chronic kidney disease with stage 1 through stage 4 chronic kidney disease, or unspecified chronic kidney disease: Secondary | ICD-10-CM | POA: Diagnosis not present

## 2023-01-12 DIAGNOSIS — N189 Chronic kidney disease, unspecified: Secondary | ICD-10-CM | POA: Diagnosis not present

## 2023-01-12 LAB — GLUCOSE, CAPILLARY
Glucose-Capillary: 208 mg/dL — ABNORMAL HIGH (ref 70–99)
Glucose-Capillary: 194 mg/dL — ABNORMAL HIGH (ref 70–99)
Glucose-Capillary: 289 mg/dL — ABNORMAL HIGH (ref 70–99)

## 2023-01-12 NOTE — Progress Notes (Signed)
EVERARD, INTERRANTE ([id number]) [id number] Page 1 of 2 Visit Report for 01/12/2023 Problem List Details Patient Name: Date of Service: [name] 01/12/2023 8:00 A M Medical Record Number: [id number] Patient Account Number: [id number] Date of Birth/Sex: Treating RN: July 02, 1946 (77 y.o. 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 01/12/2023 Medical Record Number: [id number] Patient Account Number: [id number] Date of Birth/Sex: Treating RN: November 16, 1946 (77 y.o. 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 C61 Malignant neoplasm of prostate Facility Procedures : CPT4 Code Description: [id number] G0277-(Facility Use Only) HBOT  full body chamber, 28mn , ICD-10 Diagnosis Description N30.41 Irradiation cystitis with hematuria C61 Malignant neoplasm of prostate E11.21 Type 2 diabetes mellitus with diabetic nep Modifier: hropathy Quantity: 4 Physician Procedures : CPT4 Code Description Modifier [id number] 48889- WC PHYS HYPERBARIC OXYGEN THERAPY ICD-10 Diagnosis Description N30.41 Irradiation cystitis with hematuria C61 Malignant neoplasm of prostate 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

## 2023-01-12 NOTE — Progress Notes (Signed)
ATA, PECHA ([id number]) [id number] Page 1 of 2 Visit Report for 01/12/2023 Arrival Information Details Patient Name: Date of Service: [name] 01/12/2023 8:00 [name] Medical Record Number: [id number] Patient Account Number: [id number] Date of Birth/Sex: Treating RN: 22-Apr-1946 (77 y.o. [name], [name] Primary Care Aiyannah Fayad: [name], Bill Other Clinician: Valeria Batman Referring Armondo Cech: Treating Bayler Nehring/Extender: [name], [name], [name]: 3 Visit Information History Since Last Visit All ordered tests and consults were completed: Yes Patient Arrived: [name] Added or deleted any medications: No Arrival Time: 07:43 Any new allergies or adverse reactions: No Accompanied By: None Had Bill fall or experienced change in No Transfer Assistance: None activities of daily living that may affect Patient Identification Verified: Yes risk of falls: Secondary Verification Process Completed: Yes Signs or symptoms of abuse/neglect since last visito No Patient Requires Transmission-Based Precautions: No Hospitalized since last visit: No Patient Has Alerts: Yes Implantable device outside of the clinic excluding No Patient Alerts: Patient on [name] cellular tissue based products placed in the center since last visit: Pain Present Now: No Electronic Signature(s) Signed: 01/12/2023 2:30:06 PM By: Valeria Batman EMT Previous Signature: 01/12/2023 2:27:11 PM Version By: Valeria Batman EMT Entered By: Valeria Batman on 01/12/2023 14:30:06 -------------------------------------------------------------------------------- Encounter Discharge Information Details Patient Name: Date of Service: [name], [name] 01/12/2023 8:00 [name] Medical Record Number: [id number] Patient Account Number: [id number] Date of Birth/Sex: Treating RN: 1946-12-09 (77 y.o. [name] Primary Care Bianna Haran: [name], Bill Other Clinician: Valeria Batman Referring Anthonee Gelin: Treating Keary Waterson/Extender: [name], [name], [name]: 3 Encounter Discharge Information Items Discharge Condition: Stable Ambulatory Status: Cane Discharge Destination: Home Transportation: Private Auto Accompanied By: None Schedule Follow-up Appointment: Yes Clinical Summary of Care: Electronic Signature(s) Signed: 01/12/2023 2:35:48 PM By: Valeria Batman EMT Entered By: Valeria Batman on 01/12/2023 14:35:48 [name] ([id number]) [id number] Page 2 of 2 -------------------------------------------------------------------------------- Vitals Details Patient Name: Date of Service: [name] 01/12/2023 8:00 [name] Medical Record Number: [id number] Patient Account Number: [id number] Date of Birth/Sex: Treating RN: 1946-07-14 (77 y.o. [name] Primary Care Maryl Blalock: [name], Bill Other Clinician: Valeria Batman Referring Adrin Julian: Treating Quintel Mccalla/Extender: [name], [name], [name]: 3 Vital Signs Time Taken: 07:48 Temperature (F): 98.2 Height (in): 71 Pulse (bpm): 72 Weight (lbs): 252 Respiratory Rate (breaths/min): 18 Body Mass Index (BMI): 35.1 [name]: 152/54 Capillary [name]: 194 Reference Range: 80 - 120 mg / dl Electronic Signature(s) Signed: 01/12/2023 2:30:31 PM By: Valeria Batman EMT Entered By: Valeria Batman on 01/12/2023 14:30:31

## 2023-01-12 NOTE — Progress Notes (Addendum)
Bill, DWORKIN [name] ([id number]) [id number] Page 1 of 2 Visit Report for 01/12/2023 HBO Details Patient Name: Date of Service: [name] 01/12/2023 8:00 A M Medical Record Number: [id number] Patient Account Number: [id number] Date of Birth/Sex: Treating RN: Jan 01, 1946 (77 y.o. [name], [name] Primary Care [name] Other Clinician: Valeria Batman Referring Daylee Delahoz: Treating [name]/Extender: [name], Jessica [name] Weeks in Treatment: 3 HBO Treatment Course Details Treatment Course Number: 1 Ordering Trell Secrist: [name] T Treatments Ordered: otal 40 HBO Treatment Start Date: 01/05/2023 HBO Indication: Late Effect of Radiation HBO Treatment Details Treatment Number: 6 Patient Type: Outpatient Chamber Type: Monoplace Chamber Serial #: [id number] Treatment Protocol: 2.5 ATA with 90 minutes oxygen, with two 5 minute air breaks Treatment Details Compression Rate Down: 1.5 psi / minute De-Compression Rate Up: 2.0 psi / minute A breaks and breathing ir Compress Tx Pressure periods Decompress Decompress Begins Reached (leave unused spaces Begins Ends blank) Chamber Pressure (ATA 1 2.5 2.5 2.5 2.5 2.5 - - 2.5 1 ) Clock Time (24 hr) 08:31 08:45 19:15 19:20 09:50 09:55 - - 10:25 10:36 Treatment Length: 125 (minutes) Treatment Segments: 4 Vital Signs Capillary [name] Reference Range: 80 - 120 mg / dl HBO Diabetic [name] Intervention Range: <131 mg/dl or >249 mg/dl Time Vitals [name] Respiratory Capillary [name] Pulse Action Type: Pulse: Temperature: Taken: Pressure: Rate: Glucose (mg/dl): Meter #: Oximetry (%) Taken: Pre 07:48 152/54 72 18 98.2 194 Post 10:42 161/85 75 18 98 289 Treatment Response Treatment Toleration: Well Treatment Completion Status: Treatment Completed without Adverse Event Physician HBO Attestation: I certify that I supervised this HBO treatment in accordance with  Medicare guidelines. A trained emergency response team is readily available per Yes hospital policies and procedures. Continue HBOT as ordered. Yes Electronic Signature(s) Signed: 01/12/2023 3:54:02 PM By: [name] DO Previous Signature: 01/12/2023 2:34:42 PM Version By: Valeria Batman EMT Previous Signature: 01/12/2023 3:52:58 PM Version By: [name] DO Entered By: [name] on 01/12/2023 15:53:16 [name] ([id number]) [id number] Page 2 of 2 -------------------------------------------------------------------------------- HBO Safety Checklist Details Patient Name: Date of Service: [name] 01/12/2023 8:00 A M Medical Record Number: [id number] Patient Account Number: [id number] Date of Birth/Sex: Treating RN: 1946/08/26 (77 y.o. [name], [name] Primary Care [name]: [name] Other Clinician: Valeria Batman Referring [name]: Treating [name]/Extender: [name], Jessica [name] Weeks in Treatment: 3 HBO Safety Checklist Items Safety Checklist Consent Form Signed Patient voided / foley secured and emptied When did you last eato 0630 Last dose of injectable or oral agent NA Ostomy pouch emptied and vented if applicable NA All implantable devices assessed, documented and approved NA Intravenous access site secured and place NA Valuables secured Linens and cotton and cotton/polyester blend (less than 51% polyester) Personal oil-based products / skin lotions / body lotions removed Wigs or hairpieces removed NA Smoking or tobacco materials removed Books / newspapers / magazines / loose paper removed Cologne, aftershave, perfume and deodorant removed Jewelry removed (may wrap wedding band) Make-up removed NA Hair care products removed Battery operated devices (external) removed Heating patches and chemical warmers removed Titanium eyewear removed NA Nail polish cured greater than 10  hours NA Casting material cured greater than 10 hours NA Hearing aids removed NA Loose dentures or partials removed NA Prosthetics have been removed NA Patient demonstrates correct use of air break device (if applicable) Patient concerns have been addressed Patient grounding bracelet on and cord attached to chamber Specifics for Inpatients (  complete in addition to above) Medication sheet sent with patient NA Intravenous medications needed or due during therapy sent with patient NA Drainage tubes (e.g. nasogastric tube or chest tube secured and vented) NA Endotracheal or Tracheotomy tube secured NA Cuff deflated of air and inflated with saline NA Airway suctioned NA Notes The safety checklist was done before the treatment was started. Electronic Signature(s) Signed: 01/12/2023 2:32:12 PM By: Valeria Batman EMT Entered By: Valeria Batman on 01/12/2023 14:32:11

## 2023-01-12 NOTE — Progress Notes (Signed)
BERTON, BUTRICK ([id number]) [id number] Page 1 of 1 Visit Report for 01/09/2023 [name] Details Patient Name: Date of Service: [name]. 01/09/2023 Medical Record Number: [id number] Patient Account Number: [id number] Date of Birth/Sex: Treating RN: 07-05-1946 (77 y.o. 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 C61 Malignant neoplasm of prostate Facility Procedures CPT4 Code Description Modifier Quantity [id number] G0277-(Facility Use Only) HBOT full body chamber, 93mn , 4 ICD-10 Diagnosis Description N30.41 Irradiation cystitis with hematuria C61 Malignant neoplasm of prostate 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

## 2023-01-13 ENCOUNTER — Encounter (HOSPITAL_BASED_OUTPATIENT_CLINIC_OR_DEPARTMENT_OTHER): Payer: Medicare Other | Admitting: Internal Medicine

## 2023-01-13 DIAGNOSIS — I129 Hypertensive chronic kidney disease with stage 1 through stage 4 chronic kidney disease, or unspecified chronic kidney disease: Secondary | ICD-10-CM | POA: Diagnosis not present

## 2023-01-13 DIAGNOSIS — Z8546 Personal history of malignant neoplasm of prostate: Secondary | ICD-10-CM | POA: Diagnosis not present

## 2023-01-13 DIAGNOSIS — C61 Malignant neoplasm of prostate: Secondary | ICD-10-CM

## 2023-01-13 DIAGNOSIS — N189 Chronic kidney disease, unspecified: Secondary | ICD-10-CM | POA: Diagnosis not present

## 2023-01-13 DIAGNOSIS — N3041 Irradiation cystitis with hematuria: Secondary | ICD-10-CM | POA: Diagnosis not present

## 2023-01-13 DIAGNOSIS — E1122 Type 2 diabetes mellitus with diabetic chronic kidney disease: Secondary | ICD-10-CM | POA: Diagnosis not present

## 2023-01-13 DIAGNOSIS — E1121 Type 2 diabetes mellitus with diabetic nephropathy: Secondary | ICD-10-CM

## 2023-01-13 DIAGNOSIS — C259 Malignant neoplasm of pancreas, unspecified: Secondary | ICD-10-CM | POA: Diagnosis not present

## 2023-01-13 LAB — GLUCOSE, CAPILLARY
Glucose-Capillary: 240 mg/dL — ABNORMAL HIGH (ref 70–99)
Glucose-Capillary: 289 mg/dL — ABNORMAL HIGH (ref 70–99)

## 2023-01-13 NOTE — Progress Notes (Signed)
ADONAY, SCHEIER ([id number]) [id number] Page 1 of 2 Visit Report for 01/13/2023 Arrival Information Details Patient Name: Date of Service: [name] 01/13/2023 8:00 A M Medical Record Number: [id number] Patient Account Number: [id number] Date of Birth/Sex: Treating RN: [birth year] (77 y.o. [name] Primary Care Bill Mcvey: [name], [name] Other Clinician: Valeria Batman Referring Jervon Ream: Treating Allena Pietila/Extender: [name], [name], [name] in Treatment: 3 Visit Information History Since Last Visit All ordered tests and consults were completed: Yes Patient Arrived: [name] Added or deleted any medications: No Arrival Time: 07:31 Any new allergies or adverse reactions: No Accompanied By: None Had a fall or experienced change in No Transfer Assistance: None activities of daily living that may affect Patient Identification Verified: Yes risk of falls: Secondary Verification Process Completed: Yes Signs or symptoms of abuse/neglect since last visito No Patient Requires Transmission-Based Precautions: No Hospitalized since last visit: No Patient Has Alerts: Yes Implantable device outside of the clinic excluding No Patient Alerts: Patient on Blood Thinner cellular tissue based products placed in the center since last visit: Pain Present Now: No Electronic Signature(s) Signed: 01/13/2023 5:00:19 PM By: Valeria Batman EMT Entered By: Valeria Batman on 01/13/2023 17:00:19 -------------------------------------------------------------------------------- Encounter Discharge Information Details Patient Name: Date of Service: [name], [name] 01/13/2023 8:00 A M Medical Record Number: [id number] Patient Account Number: [id number] Date of Birth/Sex: Treating RN: 03/22/1946 (77 y.o. [name] Primary Care Sadia Belfiore: [name], [name] Other Clinician: Valeria Batman Referring Hunter Pinkard: Treating Ashlin Hidalgo/Extender: [name],  [name], [name] in Treatment: 3 Encounter Discharge Information Items Discharge Condition: Stable Ambulatory Status: Cane Discharge Destination: Home Transportation: Private Auto Accompanied By: self Schedule Follow-up Appointment: No Clinical Summary of Care: Electronic Signature(s) Signed: 01/13/2023 5:52:02 PM By: [name] , , Entered By: [name] on 01/13/2023 17:52:02 [place], [name] ([id number]) [id number] Page 2 of 2 -------------------------------------------------------------------------------- Vitals Details Patient Name: Date of Service: [name] 01/13/2023 8:00 A M Medical Record Number: [id number] Patient Account Number: [id number] Date of Birth/Sex: Treating RN: October 16, 1946 (77 y.o. [name] Primary Care Verania Salberg: [name], [name] Other Clinician: Valeria Batman Referring Yumi Insalaco: Treating Jessye Imhoff/Extender: [name], [name], [name] in Treatment: 3 Vital Signs Time Taken: 07:41 Temperature (F): 98.9 Height (in): 71 Pulse (bpm): 87 Weight (lbs): 252 Respiratory Rate (breaths/min): 16 Body Mass Index (BMI): 35.1 Blood Pressure (mmHg): 146/73 Capillary Blood Glucose (mg/dl): 240 Reference Range: 80 - 120 mg / dl Electronic Signature(s) Signed: 01/13/2023 5:01:57 PM By: Valeria Batman EMT Entered By: Valeria Batman on 01/13/2023 17:01:57

## 2023-01-13 NOTE — Progress Notes (Signed)
TRAYE, BATES ([id number]) [id number] Page 1 of 2 Visit Report for 01/13/2023 HBO Details Patient Name: Date of Service: [name] 01/13/2023 8:00 A M Medical Record Number: [id number] Patient Account Number: [id number] Date of Birth/Sex: Treating RN: [birth year] (77 y.o. [name] Primary Care Khyle Goodell: [name] Other Clinician: [name] Referring [name]: Treating [name]/Extender: [name] [name] Weeks in Treatment: 3 HBO Treatment Course Details Treatment Course Number: 1 Ordering [name]: [name] T Treatments Ordered: otal 40 HBO Treatment Start Date: 01/05/2023 HBO Indication: Late Effect of Radiation HBO Treatment Details Treatment Number: 7 Patient Type: Outpatient Chamber Type: Monoplace Chamber Serial #: [id number] Treatment Protocol: 2.5 ATA with 90 minutes oxygen, with two 5 minute air breaks Treatment Details Compression Rate Down: 1.5 psi / minute De-Compression Rate Up: 2.0 psi / minute A breaks and breathing ir Compress Tx Pressure periods Decompress Decompress Begins Reached (leave unused spaces Begins Ends blank) Chamber Pressure (ATA 1 2.5 2.5 2.5 2.5 2.5 - - 2.5 1 ) Clock Time (24 hr) 07:57 08:10 08:40 08:45 09:15 09:20 - - 09:50 09:59 Treatment Length: 122 (minutes) Treatment Segments: 4 Vital Signs Capillary [name] Reference Range: 80 - 120 mg / dl HBO Diabetic [name] Intervention Range: <131 mg/dl or >249 mg/dl Type: Time Vitals [name] Pulse: Respiratory Temperature: Capillary [name] Pulse Action Taken: Pressure: Rate: Glucose (mg/dl): Meter #: Oximetry (%) Taken: Pre 07:41 146/73 87 16 98.9 240 none per protocol Post 10:07 162/84 77 16 98.4 289 1 patient asymptomatic for hyperglycemia Treatment Response Treatment Toleration: Well Treatment Completion Status: Treatment Completed without Adverse Event Treatment Notes Patient arrived, [name]  level measured at 240 mg/dL. Patient prepared for treatment. Chamber compressed at 1 psi/min until reaching 4 psig at which point patient verified that his ears were equalizing normally and rate set was increased to 2 psi/min. Patient tolerated treatment and decompression at a rate of 2 psi/min. Post treatment [name] level was 289 mg/dL. Patient was asymptomatic for hyperglycemia and stated he was going home to take his insulin. Patient was stable upon discharge. Physician HBO Attestation: I certify that I supervised this HBO treatment in accordance with Medicare guidelines. A trained emergency response team is readily available per Yes hospital policies and procedures. Continue HBOT as ordered. Yes Electronic Signature(s) Signed: 01/14/2023 8:44:28 AM By: Bill Shan DO Previous Signature: 01/13/2023 5:46:25 PM Version By: [name] CHT EMT BS , , Entered By: [name] on 01/14/2023 08:42:05 [name] ([id number]) [id number] Page 2 of 2 -------------------------------------------------------------------------------- HBO Safety Checklist Details Patient Name: Date of Service: [name] 01/13/2023 8:00 A M Medical Record Number: [id number] Patient Account Number: [id number] Date of Birth/Sex: Treating RN: [birth year] (77 y.o. [name] Primary Care [name]: [name] Other Clinician: [name] Referring [name]: Treating [name]/Extender: [name] [name] Weeks in Treatment: 3 HBO Safety Checklist Items Safety Checklist Consent Form Signed Patient voided / foley secured and emptied When did you last eato 0645 Last dose of injectable or oral agent 0945 Ostomy pouch emptied and vented if applicable NA All implantable devices assessed, documented and approved NA Intravenous access site secured and place NA Valuables secured Linens and cotton and cotton/polyester blend (less than  51% polyester) Personal oil-based products / skin lotions / body lotions removed Wigs or hairpieces removed NA Smoking or tobacco materials removed Books / newspapers / magazines / loose paper removed Cologne, aftershave, perfume and deodorant removed Jewelry removed (  may wrap wedding band) Make-up removed Hair care products removed Battery operated devices (external) removed Heating patches and chemical warmers removed Titanium eyewear removed NA Nail polish cured greater than 10 hours NA Casting material cured greater than 10 hours NA Hearing aids removed NA Loose dentures or partials removed NA Prosthetics have been removed NA Patient demonstrates correct use of air break device (if applicable) Patient concerns have been addressed Patient grounding bracelet on and cord attached to chamber Specifics for Inpatients (complete in addition to above) Medication sheet sent with patient NA Intravenous medications needed or due during therapy sent with patient NA Drainage tubes (e.g. nasogastric tube or chest tube secured and vented) NA Endotracheal or Tracheotomy tube secured NA Cuff deflated of air and inflated with saline NA Airway suctioned NA Notes The safety checklist was done before the treatment was started. Electronic Signature(s) Signed: 01/13/2023 5:03:07 PM By: [name] EMT Entered By: [name] on 01/13/2023 17:03:07

## 2023-01-14 ENCOUNTER — Other Ambulatory Visit: Payer: Self-pay | Admitting: Surgery

## 2023-01-14 ENCOUNTER — Encounter (HOSPITAL_BASED_OUTPATIENT_CLINIC_OR_DEPARTMENT_OTHER): Payer: Medicare Other | Admitting: Physician Assistant

## 2023-01-14 DIAGNOSIS — C259 Malignant neoplasm of pancreas, unspecified: Secondary | ICD-10-CM

## 2023-01-14 DIAGNOSIS — L598 Other specified disorders of the skin and subcutaneous tissue related to radiation: Secondary | ICD-10-CM | POA: Diagnosis not present

## 2023-01-14 DIAGNOSIS — Z8546 Personal history of malignant neoplasm of prostate: Secondary | ICD-10-CM | POA: Diagnosis not present

## 2023-01-14 DIAGNOSIS — N189 Chronic kidney disease, unspecified: Secondary | ICD-10-CM | POA: Diagnosis not present

## 2023-01-14 DIAGNOSIS — N3041 Irradiation cystitis with hematuria: Secondary | ICD-10-CM | POA: Diagnosis not present

## 2023-01-14 DIAGNOSIS — I129 Hypertensive chronic kidney disease with stage 1 through stage 4 chronic kidney disease, or unspecified chronic kidney disease: Secondary | ICD-10-CM | POA: Diagnosis not present

## 2023-01-14 DIAGNOSIS — E1122 Type 2 diabetes mellitus with diabetic chronic kidney disease: Secondary | ICD-10-CM | POA: Diagnosis not present

## 2023-01-14 LAB — GLUCOSE, CAPILLARY
Glucose-Capillary: 207 mg/dL — ABNORMAL HIGH (ref 70–99)
Glucose-Capillary: 296 mg/dL — ABNORMAL HIGH (ref 70–99)

## 2023-01-14 NOTE — Progress Notes (Signed)
KEVIS, QU ([id number]) [id number] Page 1 of 1 Visit Report for 01/14/2023 [name] Details Patient Name: Date of Service: [place]. 01/14/2023 Medical Record Number: [id number] Patient Account Number: [id number] Date of Birth/Sex: Treating RN: 29-Feb-1946 (77 y.o. 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 C61 Malignant neoplasm of prostate Facility Procedures CPT4 Code Description Modifier Quantity [id number] G0277-(Facility Use Only) HBOT full body chamber, 81mn , 4 ICD-10 Diagnosis Description N30.41 Irradiation cystitis with hematuria C61 Malignant neoplasm of prostate 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

## 2023-01-14 NOTE — Progress Notes (Addendum)
[name], [name] ([id number]) [id number] Page 1 of 2 Visit Report for 01/14/2023 HBO Details Patient Name: Date of Service: [name] 01/14/2023 8:00 A M Medical Record Number: [id number] Patient Account Number: [id number] Date of Birth/Sex: Treating RN: March 26, 1946 (77 y.o. [name] Primary Care Deedee Lybarger: [name], [name] Other Clinician: [name] Referring Zunairah Devers: Treating Telecia Larocque/Extender: [name], [name] [name], [name] in Treatment: 3 HBO Treatment Course Details Treatment Course Number: 1 Ordering Nyia Tsao: [name] T Treatments Ordered: otal 40 HBO Treatment Start Date: 01/05/2023 HBO Indication: Late Effect of Radiation HBO Treatment Details Treatment Number: 8 Patient Type: Outpatient Chamber Type: Monoplace Chamber Serial #: [id number] Treatment Protocol: 2.5 ATA with 90 minutes oxygen, with two 5 minute air breaks Treatment Details Compression Rate Down: 1.5 psi / minute De-Compression Rate Up: 2.0 psi / minute A breaks and breathing ir Compress Tx Pressure periods Decompress Decompress Begins Reached (leave unused spaces Begins Ends blank) Chamber Pressure (ATA 1 2.5 2.5 2.5 2.5 2.5 - - 2.5 1 ) Clock Time (24 hr) 08:02 08:13 08:43 08:48 09:18 09:23 - - 09:53 10:04 Treatment Length: 122 (minutes) Treatment Segments: 4 Vital Signs Capillary [name] Reference Range: 80 - 120 mg / dl HBO Diabetic [name] Intervention Range: <131 mg/dl or >249 mg/dl Type: Time Vitals [name] Pulse: Respiratory Temperature: Capillary [name] Pulse Action Taken: Pressure: Rate: Glucose (mg/dl): Meter #: Oximetry (%) Taken: Pre 07:54 177/76 91 18 98.4 207 1 Post 10:09 169/85 87 18 98.4 296 1 patient asymptomatic for hyperglycemia Pre 07:55 162/82 Treatment Response Treatment Completion Status: Treatment Completed without Adverse Event Treatment Notes Patient arrived, [name] level measured 207  mg/dL. Patient prepared for treatment. Chamber compressed at 1 psi/min until reaching 3 psig at which point patient verified that his ears were equalizing normally and rate set was increased to 2 psi/min. Patient tolerated treatment and decompression at a rate of 2 psi/min. Post treatment [name] level was 296 mg/dL. Patient was asymptomatic for hyperglycemia and stated he was going home to take his insulin. Patient was stable upon discharge. Electronic Signature(s) Signed: 01/14/2023 1:11:06 PM By: [name] , , Signed: 01/14/2023 4:50:42 PM By: Worthy Keeler PA-C Previous Signature: 01/14/2023 12:11:33 PM Version By: [name] , , Entered By: [name] on 01/14/2023 13:11:06 HBO Safety Checklist Details -------------------------------------------------------------------------------- [name] ([id number]) [id number] Page 2 of 2 Patient Name: Date of Service: [name]. 01/14/2023 8:00 A M Medical Record Number: [id number] Patient Account Number: [id number] Date of Birth/Sex: Treating RN: Jul 28, 1946 (77 y.o. [name] Primary Care [name]: [name], [name] Other Clinician: [name] Referring [name]: Treating [name]/Extender: [name], [name] [name], [name] in Treatment: 3 HBO Safety Checklist Items Safety Checklist Consent Form Signed Patient voided / foley secured and emptied When did you last eato 0645 Last dose of injectable or oral agent 0645 Ostomy pouch emptied and vented if applicable NA All implantable devices assessed, documented and approved [employer]Port ClearVUE Intravenous access site secured and place NA Valuables secured Linens and cotton and cotton/polyester blend (less than 51% polyester) Personal oil-based products / skin lotions / body lotions removed Wigs or hairpieces removed NA Smoking or tobacco materials removed NA Books /  newspapers / magazines / loose paper removed Cologne, aftershave, perfume and deodorant removed Jewelry removed (may wrap wedding band) Make-up removed NA Hair care products removed Battery operated devices (external) removed Heating patches and chemical  warmers removed Titanium eyewear removed Nail polish cured greater than 10 hours NA Casting material cured greater than 10 hours NA Hearing aids removed NA Loose dentures or partials removed NA Prosthetics have been removed NA Patient demonstrates correct use of air break device (if applicable) Patient concerns have been addressed Patient grounding bracelet on and cord attached to chamber Specifics for Inpatients (complete in addition to above) Medication sheet sent with patient NA Intravenous medications needed or due during therapy sent with patient NA Drainage tubes (e.g. nasogastric tube or chest tube secured and vented) NA Endotracheal or Tracheotomy tube secured NA Cuff deflated of air and inflated with saline NA Airway suctioned NA Notes Paper version used prior to treatment. Electronic Signature(s) Signed: 01/14/2023 12:09:28 PM By: [name] , , Entered By: [name] on 01/14/2023 12:09:27

## 2023-01-14 NOTE — Progress Notes (Addendum)
Bill, [name] ([id number]) [id number] Page 1 of 2 Visit Report for 01/14/2023 Arrival Information Details Patient Name: Date of Service: [name] 01/14/2023 8:00 [name] Medical Record Number: [id number] Patient Account Number: [id number] Date of Birth/Sex: Treating RN: January 28, 1946 (77 y.o. [name] Primary Care [name]: [name], Bill Other Clinician: [name] Referring [name]: Treating [name]/Extender: [name], [name], [name] in Treatment: 3 Visit Information History Since Last Visit All ordered tests and consults were completed: Yes Patient Arrived: [name] Added or deleted any medications: No Arrival Time: 07:30 Any new allergies or adverse reactions: No Accompanied By: self Had Bill fall or experienced change in No Transfer Assistance: None activities of daily living that may affect Patient Identification Verified: Yes risk of falls: Secondary Verification Process Completed: Yes Signs or symptoms of abuse/neglect since last visito No Patient Requires Transmission-Based Precautions: No Hospitalized since last visit: No Patient Has Alerts: Yes Implantable device outside of the clinic excluding No Patient Alerts: Patient on [name] cellular tissue based products placed in the center since last visit: Pain Present Now: No Electronic Signature(s) Signed: 01/14/2023 12:07:44 PM By: [name] , , Entered By: [name] on 01/14/2023 12:07:44 -------------------------------------------------------------------------------- Encounter Discharge Information Details Patient Name: Date of Service: [name], [name] 01/14/2023 8:00 [name] Medical Record Number: [id number] Patient Account Number: [id number] Date of Birth/Sex: Treating RN: 1946-10-17 (77 y.o. [name] Primary Care [name]: [name], Bill Other Clinician: [name] Referring Bristol Osentoski: Treating  Sasan Wilkie/Extender: [name], [name], [name] in Treatment: 3 Encounter Discharge Information Items Discharge Condition: Stable Ambulatory Status: Cane Discharge Destination: Home Transportation: Private Auto Accompanied By: self Schedule Follow-up Appointment: No Clinical Summary of Care: Electronic Signature(s) Signed: 01/14/2023 12:12:45 PM By: [name] , , Entered By: [name] on 01/14/2023 12:12:44 [name] ([id number]) [id number] Page 2 of 2 -------------------------------------------------------------------------------- Vitals Details Patient Name: Date of Service: [name] 01/14/2023 8:00 [name] Medical Record Number: [id number] Patient Account Number: [id number] Date of Birth/Sex: Treating RN: 01-17-1946 (77 y.o. [name] Primary Care Kahlan Engebretson: [name], Bill Other Clinician: [name] Referring Julius Matus: Treating Amadi Yoshino/Extender: [name], [name], [name] in Treatment: 3 Vital Signs Time Taken: 07:54 Temperature (F): 98.4 Height (in): 71 Pulse (bpm): 91 Weight (lbs): 252 Respiratory Rate (breaths/min): 18 Body Mass Index (BMI): 35.1 [name]: 177/76 Capillary [name]: 207 Reference Range: 80 - 120 mg / dl Electronic Signature(s) Signed: 01/14/2023 12:08:22 PM By: [name] , , Entered By: [name] on 01/14/2023 12:08:22

## 2023-01-14 NOTE — Progress Notes (Signed)
RAIF, CHACHERE ([id number]) [id number] Page 1 of 1 Visit Report for 01/13/2023 [name] Details Patient Name: Date of Service: [name]. 01/13/2023 Medical Record Number: [id number] Patient Account Number: [id number] Date of Birth/Sex: Treating RN: 1946/01/04 (77 y.o. 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 C61 Malignant neoplasm of prostate Facility Procedures CPT4 Code Description Modifier Quantity [id number] G0277-(Facility Use Only) HBOT full body chamber, 61mn , 4 ICD-10 Diagnosis Description N30.41 Irradiation cystitis with hematuria 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

## 2023-01-15 ENCOUNTER — Encounter (HOSPITAL_BASED_OUTPATIENT_CLINIC_OR_DEPARTMENT_OTHER): Payer: Medicare Other | Attending: General Surgery | Admitting: Internal Medicine

## 2023-01-15 DIAGNOSIS — E785 Hyperlipidemia, unspecified: Secondary | ICD-10-CM | POA: Diagnosis not present

## 2023-01-15 DIAGNOSIS — I129 Hypertensive chronic kidney disease with stage 1 through stage 4 chronic kidney disease, or unspecified chronic kidney disease: Secondary | ICD-10-CM | POA: Diagnosis not present

## 2023-01-15 DIAGNOSIS — E1140 Type 2 diabetes mellitus with diabetic neuropathy, unspecified: Secondary | ICD-10-CM | POA: Diagnosis not present

## 2023-01-15 DIAGNOSIS — E11621 Type 2 diabetes mellitus with foot ulcer: Secondary | ICD-10-CM | POA: Diagnosis not present

## 2023-01-15 DIAGNOSIS — N189 Chronic kidney disease, unspecified: Secondary | ICD-10-CM | POA: Insufficient documentation

## 2023-01-15 DIAGNOSIS — L97512 Non-pressure chronic ulcer of other part of right foot with fat layer exposed: Secondary | ICD-10-CM | POA: Insufficient documentation

## 2023-01-15 DIAGNOSIS — K760 Fatty (change of) liver, not elsewhere classified: Secondary | ICD-10-CM | POA: Diagnosis not present

## 2023-01-15 DIAGNOSIS — E1121 Type 2 diabetes mellitus with diabetic nephropathy: Secondary | ICD-10-CM

## 2023-01-15 DIAGNOSIS — C61 Malignant neoplasm of prostate: Secondary | ICD-10-CM | POA: Diagnosis not present

## 2023-01-15 DIAGNOSIS — E1122 Type 2 diabetes mellitus with diabetic chronic kidney disease: Secondary | ICD-10-CM | POA: Insufficient documentation

## 2023-01-15 DIAGNOSIS — K219 Gastro-esophageal reflux disease without esophagitis: Secondary | ICD-10-CM | POA: Insufficient documentation

## 2023-01-15 DIAGNOSIS — Z8546 Personal history of malignant neoplasm of prostate: Secondary | ICD-10-CM | POA: Diagnosis not present

## 2023-01-15 DIAGNOSIS — N3041 Irradiation cystitis with hematuria: Secondary | ICD-10-CM

## 2023-01-15 LAB — GLUCOSE, CAPILLARY
Glucose-Capillary: 225 mg/dL — ABNORMAL HIGH (ref 70–99)
Glucose-Capillary: 317 mg/dL — ABNORMAL HIGH (ref 70–99)

## 2023-01-15 NOTE — Progress Notes (Addendum)
GLEEN, RIPBERGER ([id number]) [id number] Page 1 of 2 Visit Report for 01/15/2023 Arrival Information Details Patient Name: Date of Service: [name] 01/15/2023 8:00 A M Medical Record Number: [id number] Patient Account Number: [id number] Date of Birth/Sex: Treating RN: 02-26-1946 (77 y.o. [name] Primary Care Kaily Wragg: [name], [name] Other Clinician: [name] Referring Eliette Drumwright: Treating Kanani Mowbray/Extender: [name], [name], [name] in Treatment: 3 Visit Information History Since Last Visit All ordered tests and consults were completed: Yes Patient Arrived: [name] Added or deleted any medications: No Arrival Time: 07:30 Any new allergies or adverse reactions: No Accompanied By: self Had a fall or experienced change in No Transfer Assistance: None activities of daily living that may affect Patient Identification Verified: Yes risk of falls: Secondary Verification Process Completed: Yes Signs or symptoms of abuse/neglect since last visito No Patient Requires Transmission-Based Precautions: No Hospitalized since last visit: No Patient Has Alerts: Yes Implantable device outside of the clinic excluding No Patient Alerts: Patient on [name] cellular tissue based products placed in the center since last visit: Pain Present Now: No Electronic Signature(s) Signed: 01/15/2023 12:03:33 PM By: [name] , , Previous Signature: 01/15/2023 9:00:04 AM Version By: [name] , , Entered By: [name] on 01/15/2023 12:03:32 -------------------------------------------------------------------------------- Encounter Discharge Information Details Patient Name: Date of Service: [name], [name] 01/15/2023 8:00 A M Medical Record Number: [id number] Patient Account Number: [id number] Date of Birth/Sex: Treating RN: [birth year] (77 y.o. [name] Primary Care Damesha Lawler:  [name], [name] Other Clinician: [name] Referring Canisha Issac: Treating Kwali Wrinkle/Extender: [name], [name], [name] in Treatment: 3 Encounter Discharge Information Items Discharge Condition: Stable Ambulatory Status: Cane Discharge Destination: Home Transportation: Private Auto Accompanied By: self Schedule Follow-up Appointment: No Clinical Summary of Care: Electronic Signature(s) Signed: 01/15/2023 12:27:07 PM By: [name] , , Entered By: [name] on 01/15/2023 12:27:07 [name] ([id number]) [id number] Page 2 of 2 -------------------------------------------------------------------------------- Vitals Details Patient Name: Date of Service: [name] 01/15/2023 8:00 A M Medical Record Number: [id number] Patient Account Number: [id number] Date of Birth/Sex: Treating RN: 1946-09-07 (77 y.o. [name] Primary Care Yanelis Osika: [name], [name] Other Clinician: [name] Referring Ashaun Gaughan: Treating Ahkeem Goede/Extender: [name], [name], [name] in Treatment: 3 Vital Signs Time Taken: 07:38 Temperature (F): 98.6 Height (in): 71 Pulse (bpm): 89 Weight (lbs): 252 Respiratory Rate (breaths/min): 18 Body Mass Index (BMI): 35.1 [name]: 144/69 Capillary [name]: 225 Reference Range: 80 - 120 mg / dl Electronic Signature(s) Signed: 01/15/2023 12:03:42 PM By: [name] , , Previous Signature: 01/15/2023 9:00:38 AM Version By: [name] , , Entered By: [name] on 01/15/2023 12:03:42

## 2023-01-15 NOTE — Progress Notes (Signed)
SANTANNA, OLENIK ([id number]) [id number] Page 1 of 1 Visit Report for 01/15/2023 [name] Details Patient Name: Date of Service: [name]. 01/15/2023 Medical Record Number: [id number] Patient Account Number: [id number] Date of Birth/Sex: Treating RN: September 05, 1946 (77 y.o. 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

## 2023-01-15 NOTE — Progress Notes (Addendum)
[name], [name] ([id number]) [id number] Page 1 of 2 Visit Report for 01/15/2023 HBO Details Patient Name: Date of Service: [name] 01/15/2023 8:00 A M Medical Record Number: [id number] Patient Account Number: [id number] Date of Birth/Sex: Treating RN: [birth year] (77 y.o. [name] Primary Care Myiah Petkus: [name] Other Clinician: Donavan [name] Referring [name]: Treating [name]/Extender: [name], [name], [name] Weeks in Treatment: 3 HBO Treatment Course Details Treatment Course Number: 1 Ordering [name]: [name] T Treatments Ordered: otal 40 HBO Treatment Start Date: 01/05/2023 HBO Indication: Late Effect of Radiation HBO Treatment Details Treatment Number: 9 Patient Type: Outpatient Chamber Type: Monoplace Chamber Serial #: [id number] Treatment Protocol: 2.5 ATA with 90 minutes oxygen, with two 5 minute air breaks Treatment Details Compression Rate Down: 1.5 psi / minute De-Compression Rate Up: 2.0 psi / minute A breaks and breathing ir Compress Tx Pressure periods Decompress Decompress Begins Reached (leave unused spaces Begins Ends blank) Chamber Pressure (ATA 1 2.5 2.5 2.5 2.5 2.5 - - 2.5 1 ) Clock Time (24 hr) 07:59 08:12 08:42 08:47 09:17 09:22 - - 09:52 10:03 Treatment Length: 124 (minutes) Treatment Segments: 4 Vital Signs Capillary [name] Reference Range: 80 - 120 mg / dl HBO Diabetic [name] Intervention Range: <131 mg/dl or >249 mg/dl Type: Time Vitals [name] Respiratory Capillary [name] Pulse Action Pulse: Temperature: Taken: Pressure: Rate: Glucose (mg/dl): Meter #: Oximetry (%) Taken: Pre 07:38 144/69 89 18 98.6 225 1 none per protocol Post 10:06 150/79 75 18 98.6 317 1 Informed [name] Treatment Response Treatment Toleration: Well Treatment Completion Status: Treatment Completed without Adverse Event Treatment Notes Patient arrived, [name] level  measured. 225 mg/dL Patient was placed in the chamber after performing safety check. Chamber was compressed at 1 psi/min until reaching 4.5 psig and rate set was increased to 2 psi/min after patient confirmed proper ear equalization. Patient tolerated treatment, chamber was decompressed at the same 2 psi/min. Patient's post treatment [name] was 317 mg/dL. Dr. Heber [name] was informed. Patient was stable upon discharge. Physician HBO Attestation: I certify that I supervised this HBO treatment in accordance with Medicare guidelines. A trained emergency response team is readily available per Yes hospital policies and procedures. Continue HBOT as ordered. Yes Electronic Signature(s) Signed: 01/15/2023 4:06:31 PM By: Bill Shan DO Previous Signature: 01/15/2023 12:21:30 PM Version By: [name] CHT EMT BS , , Previous Signature: 01/15/2023 11:57:27 AM Version By: [name] CHT EMT BS , , Previous Signature: 01/15/2023 9:02:22 AM Version By: [name] CHT EMT BS , , Entered By: [name] on 01/15/2023 16:04:04 [name] ([id number]) [id number] Page 2 of 2 -------------------------------------------------------------------------------- HBO Safety Checklist Details Patient Name: Date of Service: [name] 01/15/2023 8:00 A M Medical Record Number: [id number] Patient Account Number: [id number] Date of Birth/Sex: Treating RN: 1946-10-03 (77 y.o. [name] Primary Care [name]: [name] Other Clinician: Donavan [name] Referring [name]: Treating [name]/Extender: [name], [name], [name] Weeks in Treatment: 3 HBO Safety Checklist Items Safety Checklist Consent Form Signed Patient voided / foley secured and emptied When did you last eato 0745 Last dose of injectable or oral agent 0645 Ostomy pouch emptied and vented if applicable NA All implantable devices assessed, documented and approved  Bard PowerPort ClearVUE Intravenous access site secured and place NA Valuables secured Linens and cotton and cotton/polyester blend (less than 51% polyester) Personal oil-based products / skin lotions / body lotions removed Wigs or hairpieces removed NA Smoking or tobacco  materials removed NA Books / newspapers / magazines / loose paper removed Cologne, aftershave, perfume and deodorant removed Jewelry removed (may wrap wedding band) Make-up removed NA Hair care products removed Battery operated devices (external) removed Heating patches and chemical warmers removed Titanium eyewear removed Nail polish cured greater than 10 hours NA Casting material cured greater than 10 hours Hearing aids removed NA Loose dentures or partials removed NA Prosthetics have been removed NA Patient demonstrates correct use of air break device (if applicable) Patient concerns have been addressed Patient grounding bracelet on and cord attached to chamber Specifics for Inpatients (complete in addition to above) Medication sheet sent with patient NA Intravenous medications needed or due during therapy sent with patient NA Drainage tubes (e.g. nasogastric tube or chest tube secured and vented) NA Endotracheal or Tracheotomy tube secured NA Cuff deflated of air and inflated with saline NA Airway suctioned NA Notes Paper version used prior to treatment. Electronic Signature(s) Signed: 01/15/2023 12:03:58 PM By: [name] CHT EMT BS , , Previous Signature: 01/15/2023 9:01:47 AM Version By: [name] CHT EMT BS , , Entered By: [name] on 01/15/2023 12:03:58

## 2023-01-16 ENCOUNTER — Encounter (HOSPITAL_BASED_OUTPATIENT_CLINIC_OR_DEPARTMENT_OTHER): Payer: Medicare Other | Admitting: Internal Medicine

## 2023-01-16 DIAGNOSIS — C61 Malignant neoplasm of prostate: Secondary | ICD-10-CM | POA: Diagnosis not present

## 2023-01-16 DIAGNOSIS — E785 Hyperlipidemia, unspecified: Secondary | ICD-10-CM | POA: Diagnosis not present

## 2023-01-16 DIAGNOSIS — E11621 Type 2 diabetes mellitus with foot ulcer: Secondary | ICD-10-CM | POA: Diagnosis not present

## 2023-01-16 DIAGNOSIS — E1140 Type 2 diabetes mellitus with diabetic neuropathy, unspecified: Secondary | ICD-10-CM | POA: Diagnosis not present

## 2023-01-16 DIAGNOSIS — N3041 Irradiation cystitis with hematuria: Secondary | ICD-10-CM

## 2023-01-16 DIAGNOSIS — L97512 Non-pressure chronic ulcer of other part of right foot with fat layer exposed: Secondary | ICD-10-CM | POA: Diagnosis not present

## 2023-01-16 DIAGNOSIS — E1122 Type 2 diabetes mellitus with diabetic chronic kidney disease: Secondary | ICD-10-CM | POA: Diagnosis not present

## 2023-01-16 DIAGNOSIS — E1121 Type 2 diabetes mellitus with diabetic nephropathy: Secondary | ICD-10-CM

## 2023-01-16 DIAGNOSIS — I129 Hypertensive chronic kidney disease with stage 1 through stage 4 chronic kidney disease, or unspecified chronic kidney disease: Secondary | ICD-10-CM | POA: Diagnosis not present

## 2023-01-16 LAB — GLUCOSE, CAPILLARY
Glucose-Capillary: 164 mg/dL — ABNORMAL HIGH (ref 70–99)
Glucose-Capillary: 224 mg/dL — ABNORMAL HIGH (ref 70–99)

## 2023-01-16 NOTE — Progress Notes (Signed)
EVERADO, PILLSBURY ([id number]) [id number] Page 1 of 2 Visit Report for 01/16/2023 Problem List Details Patient Name: Date of Service: [name]. 01/16/2023 8:00 A M Medical Record Number: [id number] Patient Account Number: [id number] Date of Birth/Sex: Treating RN: [birth year] (78 y.o. 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 01/16/2023 Medical Record Number: [id number] Patient Account Number: [id number] Date of Birth/Sex: Treating RN: [birth year] (77 y.o. 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 C61 Malignant neoplasm of prostate Facility Procedures : CPT4 Code Description: [id number] G0277-(Facility Use Only) HBOT  full body chamber, 23mn , ICD-10 Diagnosis Description N30.41 Irradiation cystitis with hematuria C61 Malignant neoplasm of prostate E11.21 Type 2 diabetes mellitus with diabetic n Modifier: ephropathy Quantity: 4 Physician Procedures : CPT4 Code Description Modifier [id number]- [id number] PHYS HYPERBARIC OXYGEN THERAPY ICD-10 Diagnosis Description N30.41 Irradiation cystitis with hematuria C61 Malignant neoplasm of prostate 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

## 2023-01-16 NOTE — Progress Notes (Addendum)
[name] ([id number]) [id number] Page 1 of 2 Visit Report for 01/16/2023 Arrival Information Details Patient Name: Date of Service: [name] 01/16/2023 8:00 A M Medical Record Number: [id number] Patient Account Number: [id number] Date of Birth/Sex: Treating RN: [birth year] (77 y.o. [name] Primary Care Jakeya Gherardi: [name], [name] Other Clinician: Valeria Batman Referring Abie Cheek: Treating Vernecia Umble/Extender: [name], [name], [name] in Treatment: 3 Visit Information History Since Last Visit All ordered tests and consults were completed: Yes Patient Arrived: [name] Added or deleted any medications: No Arrival Time: 07:32 Any new allergies or adverse reactions: No Accompanied By: None Had a fall or experienced change in No Transfer Assistance: None activities of daily living that may affect Patient Identification Verified: Yes risk of falls: Secondary Verification Process Completed: Yes Signs or symptoms of abuse/neglect since last visito No Patient Requires Transmission-Based Precautions: No Hospitalized since last visit: No Patient Has Alerts: Yes Implantable device outside of the clinic excluding No Patient Alerts: Patient on [name] cellular tissue based products placed in the center since last visit: Pain Present Now: No Electronic Signature(s) Signed: 01/16/2023 10:41:03 AM By: Valeria Batman EMT Entered By: Valeria Batman on 01/16/2023 10:41:03 -------------------------------------------------------------------------------- Encounter Discharge Information Details Patient Name: Date of Service: Bill Paganini, [name] 01/16/2023 8:00 A M Medical Record Number: [id number] Patient Account Number: [id number] Date of Birth/Sex: Treating RN: [birth year] (77 y.o. [name] Primary Care Anirudh Baiz: [name], [name] Other Clinician: Valeria Batman Referring Nai Borromeo: Treating Elana Jian/Extender: [name],  [name], [name] in Treatment: 3 Encounter Discharge Information Items Discharge Condition: Stable Ambulatory Status: Cane Discharge Destination: Home Transportation: Private Auto Accompanied By: None Schedule Follow-up Appointment: Yes Clinical Summary of Care: Electronic Signature(s) Signed: 01/16/2023 10:49:14 AM By: Valeria Batman EMT Entered By: Valeria Batman on 01/16/2023 10:49:14 [name] ([id number]) [id number] Page 2 of 2 -------------------------------------------------------------------------------- Vitals Details Patient Name: Date of Service: [name] 01/16/2023 8:00 A M Medical Record Number: [id number] Patient Account Number: [id number] Date of Birth/Sex: Treating RN: Jan 13, 1946 (77 y.o. [name] Primary Care Derica Leiber: [name], [name] Other Clinician: Valeria Batman Referring Elliannah Wayment: Treating Deserai Cansler/Extender: [name], [name], [name] in Treatment: 3 Vital Signs Time Taken: 07:43 Temperature (F): 98.6 Height (in): 71 Pulse (bpm): 87 Weight (lbs): 252 Respiratory Rate (breaths/min): 16 Body Mass Index (BMI): 35.1 [name]: 147/53 Capillary [name]: 164 Reference Range: 80 - 120 mg / dl Airway Pulse Oximetry (%): 99 Electronic Signature(s) Signed: 01/16/2023 10:41:41 AM By: Valeria Batman EMT Entered By: Valeria Batman on 01/16/2023 10:41:40

## 2023-01-16 NOTE — Progress Notes (Addendum)
ATHENS, LEBEAU L ([id number]) [id number] Page 1 of 2 Visit Report for 01/16/2023 HBO Details Patient Name: Date of Service: [name] 01/16/2023 8:00 A M Medical Record Number: [id number] Patient Account Number: [id number] Date of Birth/Sex: Treating RN: 08-29-1946 (77 y.o. [name], [name] Primary Care [name]: [name] Other Clinician: [name] Referring [name]: Treating [name]/Extender: [name] [name] Weeks in Treatment: 3 HBO Treatment Course Details Treatment Course Number: 1 Ordering [name]: [name] T Treatments Ordered: otal 40 HBO Treatment Start Date: 01/05/2023 HBO Indication: Late Effect of Radiation HBO Treatment Details Treatment Number: 10 Patient Type: Outpatient Chamber Type: Monoplace Chamber Serial #: [id number] Treatment Protocol: 2.5 ATA with 90 minutes oxygen, with two 5 minute air breaks Treatment Details Compression Rate Down: 2.0 psi / minute De-Compression Rate Up: 2.0 psi / minute A breaks and breathing ir Compress Tx Pressure periods Decompress Decompress Begins Reached (leave unused spaces Begins Ends blank) Chamber Pressure (ATA 1 2.5 2.5 2.5 2.5 2.5 - - 2.5 1 ) Clock Time (24 hr) 08:02 08:19 09:49 08:54 09:24 09:29 - - 09:59 10:07 Treatment Length: 125 (minutes) Treatment Segments: 4 Vital Signs Capillary [name] Reference Range: 80 - 120 mg / dl HBO Diabetic [name] Intervention Range: <131 mg/dl or >249 mg/dl Time Vitals [name] Respiratory Capillary [name] Pulse Action Type: Pulse: Temperature: Taken: Pressure: Rate: Glucose (mg/dl): Meter #: Oximetry (%) Taken: Pre 07:43 147/53 87 16 98.6 164 99 Post 10:13 150/78 75 18 98.1 224 Treatment Response Treatment Toleration: Well Treatment Completion Status: Treatment Completed without Adverse Event Physician HBO Attestation: I certify that I supervised this HBO treatment in accordance with  Medicare guidelines. A trained emergency response team is readily available per Yes hospital policies and procedures. Continue HBOT as ordered. Yes Electronic Signature(s) Signed: 01/19/2023 9:12:52 AM By: Bill Shan DO Previous Signature: 01/16/2023 10:48:01 AM Version By: [name] EMT Previous Signature: 01/16/2023 11:56:15 AM Version By: Bill Shan DO Entered By: [name] on 01/19/2023 09:08:56 [name] ([id number]) [id number] Page 2 of 2 -------------------------------------------------------------------------------- HBO Safety Checklist Details Patient Name: Date of Service: [name] 01/16/2023 8:00 A M Medical Record Number: [id number] Patient Account Number: [id number] Date of Birth/Sex: Treating RN: 12-05-1946 (77 y.o. [name], [name] Primary Care [name]: [name] Other Clinician: [name] Referring [name]: Treating [name]/Extender: [name] [name] Weeks in Treatment: 3 HBO Safety Checklist Items Safety Checklist Consent Form Signed Patient voided / foley secured and emptied When did you last eato 0630 Last dose of injectable or oral agent 0645 Ostomy pouch emptied and vented if applicable NA All implantable devices assessed, documented and approved Bard PowerPort ClearVUE Intravenous access site secured and place NA Valuables secured Linens and cotton and cotton/polyester blend (less than 51% polyester) Personal oil-based products / skin lotions / body lotions removed Wigs or hairpieces removed NA Smoking or tobacco materials removed Books / newspapers / magazines / loose paper removed Cologne, aftershave, perfume and deodorant removed Jewelry removed (may wrap wedding band) Make-up removed NA Hair care products removed Battery operated devices (external) removed Heating patches and chemical warmers removed Titanium eyewear removed NA Nail polish cured greater  than 10 hours NA Casting material cured greater than 10 hours NA Hearing aids removed NA Loose dentures or partials removed NA Prosthetics have been removed NA Patient demonstrates correct use of air break device (if applicable) Patient concerns have been addressed Patient grounding bracelet on and cord attached to chamber  Specifics for Inpatients (complete in addition to above) Medication sheet sent with patient NA Intravenous medications needed or due during therapy sent with patient NA Drainage tubes (e.g. nasogastric tube or chest tube secured and vented) NA Endotracheal or Tracheotomy tube secured NA Cuff deflated of air and inflated with saline NA Airway suctioned NA Notes The safety checklist was done before the treatment was started. Electronic Signature(s) Signed: 01/16/2023 10:45:06 AM By: [name] EMT Entered By: [name] on 01/16/2023 10:45:06

## 2023-01-19 ENCOUNTER — Encounter (HOSPITAL_BASED_OUTPATIENT_CLINIC_OR_DEPARTMENT_OTHER): Payer: Medicare Other | Admitting: Internal Medicine

## 2023-01-19 DIAGNOSIS — E1121 Type 2 diabetes mellitus with diabetic nephropathy: Secondary | ICD-10-CM | POA: Diagnosis not present

## 2023-01-19 DIAGNOSIS — E1140 Type 2 diabetes mellitus with diabetic neuropathy, unspecified: Secondary | ICD-10-CM | POA: Diagnosis not present

## 2023-01-19 DIAGNOSIS — N3041 Irradiation cystitis with hematuria: Secondary | ICD-10-CM

## 2023-01-19 DIAGNOSIS — L97512 Non-pressure chronic ulcer of other part of right foot with fat layer exposed: Secondary | ICD-10-CM | POA: Diagnosis not present

## 2023-01-19 DIAGNOSIS — E785 Hyperlipidemia, unspecified: Secondary | ICD-10-CM | POA: Diagnosis not present

## 2023-01-19 DIAGNOSIS — I129 Hypertensive chronic kidney disease with stage 1 through stage 4 chronic kidney disease, or unspecified chronic kidney disease: Secondary | ICD-10-CM | POA: Diagnosis not present

## 2023-01-19 DIAGNOSIS — E11621 Type 2 diabetes mellitus with foot ulcer: Secondary | ICD-10-CM | POA: Diagnosis not present

## 2023-01-19 DIAGNOSIS — C61 Malignant neoplasm of prostate: Secondary | ICD-10-CM

## 2023-01-19 DIAGNOSIS — E1122 Type 2 diabetes mellitus with diabetic chronic kidney disease: Secondary | ICD-10-CM | POA: Diagnosis not present

## 2023-01-19 LAB — GLUCOSE, CAPILLARY
Glucose-Capillary: 181 mg/dL — ABNORMAL HIGH (ref 70–99)
Glucose-Capillary: 221 mg/dL — ABNORMAL HIGH (ref 70–99)

## 2023-01-19 NOTE — Progress Notes (Addendum)
MUSTAFA, POTTS ([id number]) [id number] Page 1 of 2 Visit Report for 01/19/2023 Arrival Information Details Patient Name: Date of Service: [name] 01/19/2023 8:00 A M Medical Record Number: [id number] Patient Account Number: [id number] Date of Birth/Sex: Treating RN: September 15, 1946 (77 y.o. [name] Primary Care Manley Fason: [name], [name] Other Clinician: Valeria Batman Referring Niranjan Rufener: Treating Masin Shatto/Extender: [name], [name], [name] in Treatment: 4 Visit Information History Since Last Visit All ordered tests and consults were completed: Yes Patient Arrived: [name] Added or deleted any medications: No Arrival Time: 07:35 Any new allergies or adverse reactions: No Accompanied By: None Had a fall or experienced change in No Transfer Assistance: None activities of daily living that may affect Patient Identification Verified: Yes risk of falls: Secondary Verification Process Completed: Yes Signs or symptoms of abuse/neglect since last visito No Patient Requires Transmission-Based Precautions: No Hospitalized since last visit: No Patient Has Alerts: Yes Implantable device outside of the clinic excluding No Patient Alerts: Patient on Blood Thinner cellular tissue based products placed in the center since last visit: Pain Present Now: No Electronic Signature(s) Signed: 01/19/2023 10:39:52 AM By: Valeria Batman EMT Entered By: Valeria Batman on 01/19/2023 10:39:51 -------------------------------------------------------------------------------- Encounter Discharge Information Details Patient Name: Date of Service: [name], [name] 01/19/2023 8:00 A M Medical Record Number: [id number] Patient Account Number: [id number] Date of Birth/Sex: Treating RN: 1946-06-11 (77 y.o. [name] Primary Care Zaccary Creech: [name], [name] Other Clinician: Valeria Batman Referring Ridhima Golberg: Treating Karon Heckendorn/Extender: [name],  [name], [name] in Treatment: 4 Encounter Discharge Information Items Discharge Condition: Stable Ambulatory Status: Cane Discharge Destination: Home Transportation: Private Auto Accompanied By: None Schedule Follow-up Appointment: Yes Clinical Summary of Care: Electronic Signature(s) Signed: 01/19/2023 2:13:00 PM By: Valeria Batman EMT Entered By: Valeria Batman on 01/19/2023 14:12:59 [name] ([id number]) [id number] Page 2 of 2 -------------------------------------------------------------------------------- Vitals Details Patient Name: Date of Service: [name] 01/19/2023 8:00 A M Medical Record Number: [id number] Patient Account Number: [id number] Date of Birth/Sex: Treating RN: Aug 12, 1946 (77 y.o. [name] Primary Care Valencia Kassa: [name], [name] Other Clinician: Valeria Batman Referring Antonis Lor: Treating Xareni Kelch/Extender: [name], [name], [name] in Treatment: 4 Vital Signs Time Taken: 07:58 Temperature (F): 98.4 Height (in): 71 Pulse (bpm): 82 Weight (lbs): 252 Respiratory Rate (breaths/min): 16 Body Mass Index (BMI): 35.1 Blood Pressure (mmHg): 149/84 Capillary Blood Glucose (mg/dl): 181 Reference Range: 80 - 120 mg / dl Electronic Signature(s) Signed: 01/19/2023 10:40:24 AM By: Valeria Batman EMT Entered By: Valeria Batman on 01/19/2023 10:40:24

## 2023-01-19 NOTE — Progress Notes (Signed)
[name], [name] ([id number]) [id number] Page 1 of 2 Visit Report for 01/19/2023 HBO Details Patient Name: Date of Service: [name] 01/19/2023 8:00 A M Medical Record Number: [id number] Patient Account Number: [id number] Date of Birth/Sex: Treating RN: 04/28/1946 (77 y.o. [name] Primary Care Jillien Yakel: [name], Bill Other Clinician: Valeria Batman Referring Sonnie Bias: Treating Firas Guardado/Extender: [name], [name], [name] in Treatment: 4 HBO Treatment Course Details Treatment Course Number: 1 Ordering Jnaya Butrick: [name] T Treatments Ordered: otal 40 HBO Treatment Start Date: 01/05/2023 HBO Indication: Late Effect of Radiation HBO Treatment Details Treatment Number: 11 Patient Type: Outpatient Chamber Type: Monoplace Chamber Serial #: [id number] Treatment Protocol: 2.5 ATA with 90 minutes oxygen, with two 5 minute air breaks Treatment Details Compression Rate Down: 1.0 psi / minute De-Compression Rate Up: A breaks and breathing ir Compress Tx Pressure periods Decompress Decompress Begins Reached (leave unused spaces Begins Ends blank) Chamber Pressure (ATA 1 2.5 2.5 2.5 2.5 2.5 - - 2.5 1 ) Clock Time (24 hr) 08:05 08:24 08:55 09:00 09:30 09:35 - - 10:05 10:16 Treatment Length: 131 (minutes) Treatment Segments: 4 Vital Signs Capillary Blood Glucose Reference Range: 80 - 120 mg / dl HBO Diabetic Blood Glucose Intervention Range: <131 mg/dl or >249 mg/dl Time Vitals Blood Respiratory Capillary Blood Glucose Pulse Action Type: Pulse: Temperature: Taken: Pressure: Rate: Glucose (mg/dl): Meter #: Oximetry (%) Taken: Pre 07:58 149/84 82 16 98.4 181 Post 10:18 160/79 75 18 98.9 221 Treatment Response Treatment Toleration: Well Treatment Completion Status: Treatment Completed without Adverse Event Physician HBO Attestation: I certify that I supervised this HBO treatment in accordance with Medicare guidelines. A  trained emergency response team is readily available per Yes hospital policies and procedures. Continue HBOT as ordered. Yes Electronic Signature(s) Signed: 01/20/2023 9:21:56 AM By: [name] DO Previous Signature: 01/19/2023 2:10:54 PM Version By: Valeria Batman EMT Entered By: [name] on 01/20/2023 09:06:06 HBO Safety Checklist Details -------------------------------------------------------------------------------- [name] ([id number]) [id number] Page 2 of 2 Patient Name: Date of Service: [name] 01/19/2023 8:00 A M Medical Record Number: [id number] Patient Account Number: [id number] Date of Birth/Sex: Treating RN: 07/23/1946 (77 y.o. [name] Primary Care [name]: [name], Bill Other Clinician: Valeria Batman Referring [name]: Treating [name]/Extender: [name], [name], [name] in Treatment: 4 HBO Safety Checklist Items Safety Checklist Consent Form Signed Patient voided / foley secured and emptied When did you last eato 0630 Last dose of injectable or oral agent 0630 Ostomy pouch emptied and vented if applicable NA All implantable devices assessed, documented and approved Bard PowerPort ClearVUE Intravenous access site secured and place NA Valuables secured Linens and cotton and cotton/polyester blend (less than 51% polyester) Personal oil-based products / skin lotions / body lotions removed Wigs or hairpieces removed NA Smoking or tobacco materials removed Books / newspapers / magazines / loose paper removed Cologne, aftershave, perfume and deodorant removed Jewelry removed (may wrap wedding band) Make-up removed NA Hair care products removed Battery operated devices (external) removed Heating patches and chemical warmers removed Titanium eyewear removed NA Nail polish cured greater than 10 hours NA Casting material cured greater than 10 hours NA Hearing aids removed NA Loose  dentures or partials removed NA Prosthetics have been removed NA Patient demonstrates correct use of air break device (if applicable) Patient concerns have been addressed Patient grounding bracelet on and cord attached to chamber Specifics for Inpatients (complete in addition to above) Medication sheet sent with patient NA Intravenous  medications needed or due during therapy sent with patient NA Drainage tubes (e.g. nasogastric tube or chest tube secured and vented) NA Endotracheal or Tracheotomy tube secured NA Cuff deflated of air and inflated with saline NA Airway suctioned NA Notes The safety checklist was done before the treatment was started. Electronic Signature(s) Signed: 01/19/2023 2:08:32 PM By: Valeria Batman EMT Entered By: Valeria Batman on 01/19/2023 14:08:31

## 2023-01-20 ENCOUNTER — Encounter (HOSPITAL_BASED_OUTPATIENT_CLINIC_OR_DEPARTMENT_OTHER): Payer: Medicare Other | Admitting: Internal Medicine

## 2023-01-20 DIAGNOSIS — E1122 Type 2 diabetes mellitus with diabetic chronic kidney disease: Secondary | ICD-10-CM | POA: Diagnosis not present

## 2023-01-20 DIAGNOSIS — E1121 Type 2 diabetes mellitus with diabetic nephropathy: Secondary | ICD-10-CM

## 2023-01-20 DIAGNOSIS — N3041 Irradiation cystitis with hematuria: Secondary | ICD-10-CM | POA: Diagnosis not present

## 2023-01-20 DIAGNOSIS — E1140 Type 2 diabetes mellitus with diabetic neuropathy, unspecified: Secondary | ICD-10-CM | POA: Diagnosis not present

## 2023-01-20 DIAGNOSIS — E11621 Type 2 diabetes mellitus with foot ulcer: Secondary | ICD-10-CM | POA: Diagnosis not present

## 2023-01-20 DIAGNOSIS — L97512 Non-pressure chronic ulcer of other part of right foot with fat layer exposed: Secondary | ICD-10-CM | POA: Diagnosis not present

## 2023-01-20 DIAGNOSIS — C61 Malignant neoplasm of prostate: Secondary | ICD-10-CM

## 2023-01-20 DIAGNOSIS — I129 Hypertensive chronic kidney disease with stage 1 through stage 4 chronic kidney disease, or unspecified chronic kidney disease: Secondary | ICD-10-CM | POA: Diagnosis not present

## 2023-01-20 DIAGNOSIS — E785 Hyperlipidemia, unspecified: Secondary | ICD-10-CM | POA: Diagnosis not present

## 2023-01-20 LAB — GLUCOSE, CAPILLARY
Glucose-Capillary: 204 mg/dL — ABNORMAL HIGH (ref 70–99)
Glucose-Capillary: 225 mg/dL — ABNORMAL HIGH (ref 70–99)

## 2023-01-20 NOTE — Progress Notes (Addendum)
GALVIN, AVERSA ([id number]) [id number] Page 1 of 2 Visit Report for 01/20/2023 HBO Details Patient Name: Date of Service: [name] 01/20/2023 8:00 A M Medical Record Number: [id number] Patient Account Number: [id number] Date of Birth/Sex: Treating RN: September 02, 1946 (77 y.o. [name] Primary Care [name]: [name], [name] Other Clinician: [name] Referring [name]: Treating [name]/Extender: [name], [name], [name]: 4 HBO Treatment Course Details Treatment Course Number: 1 Ordering [name]: [name] T Treatments Ordered: otal 40 HBO Treatment Start Date: 01/05/2023 HBO Indication: Late Effect of Radiation HBO Treatment Details Treatment Number: 12 Patient Type: Outpatient Chamber Type: Monoplace Chamber Serial #: [id number] Treatment Protocol: 2.5 ATA with 90 minutes oxygen, with two 5 minute air breaks Treatment Details Compression Rate Down: 2.0 psi / minute De-Compression Rate Up: 2.0 psi / minute A breaks and breathing ir Compress Tx Pressure periods Decompress Decompress Begins Reached (leave unused spaces Begins Ends blank) Chamber Pressure (ATA 1 2.5 2.5 2.5 2.5 2.5 - - 2.5 1 ) Clock Time (24 hr) 08:04 08:15 08:45 08:50 09:23 09:28 - - 09:58 10:11 Treatment Length: 127 (minutes) Treatment Segments: 4 Vital Signs Capillary [name] Reference Range: 80 - 120 mg / dl HBO Diabetic [name] Intervention Range: <131 mg/dl or >249 mg/dl Time Vitals [name] Respiratory Capillary [name] Pulse Action Type: Pulse: Temperature: Taken: Pressure: Rate: Glucose (mg/dl): Meter #: Oximetry (%) Taken: Pre 07:45 149/75 84 16 98.6 204 Post 10:18 156/78 82 16 97.9 225 Treatment Response Treatment Toleration: Well Treatment Completion Status: Treatment Completed without Adverse Event Physician HBO Attestation: I certify that I supervised this HBO treatment in accordance with  Medicare guidelines. A trained emergency response team is readily available per Yes hospital policies and procedures. Continue HBOT as ordered. Yes Electronic Signature(s) Signed: 01/20/2023 3:36:48 PM By: [name] Shan DO Previous Signature: 01/20/2023 1:33:30 PM Version By: [name] EMT Entered By: [name] on 01/20/2023 15:23:37 HBO Safety Checklist Details -------------------------------------------------------------------------------- [name] ([id number]) [id number] Page 2 of 2 Patient Name: Date of Service: SPA [name] Priest L. 01/20/2023 8:00 A M Medical Record Number: [id number] Patient Account Number: [id number] Date of Birth/Sex: Treating RN: July 30, 1946 (77 y.o. [name] Primary Care [name]: [name], [name] Other Clinician: [name] Referring [name]: Treating [name]/Extender: [name], [name], [name]: 4 HBO Safety Checklist Items Safety Checklist Consent Form Signed Patient voided / foley secured and emptied When did you last eato 0630 Last dose of injectable or oral agent 0640 Ostomy pouch emptied and vented if applicable NA All implantable devices assessed, documented and approved Bard PowerPort ClearVUE Intravenous access site secured and place NA Valuables secured Linens and cotton and cotton/polyester blend (less than 51% polyester) Personal oil-based products / skin lotions / body lotions removed Wigs or hairpieces removed NA Smoking or tobacco materials removed Books / newspapers / magazines / loose paper removed Cologne, aftershave, perfume and deodorant removed Jewelry removed (may wrap wedding band) Make-up removed NA Hair care products removed Battery operated devices (external) removed Heating patches and chemical warmers removed Titanium eyewear removed NA Nail polish cured greater than 10 hours NA Casting material cured greater than 10 hours NA Hearing  aids removed NA Loose dentures or partials removed NA Prosthetics have been removed NA Patient demonstrates correct use of air break device (if applicable) Patient concerns have been addressed Patient grounding bracelet on and cord attached to chamber Specifics for Inpatients (complete in addition to above) Medication sheet sent  with patient NA Intravenous medications needed or due during therapy sent with patient NA Drainage tubes (e.g. nasogastric tube or chest tube secured and vented) NA Endotracheal or Tracheotomy tube secured NA Cuff deflated of air and inflated with saline NA Airway suctioned NA Notes The safety checklist was done before the treatment was started. Electronic Signature(s) Signed: 01/20/2023 1:31:40 PM By: [name] EMT Entered By: [name] on 01/20/2023 13:31:39

## 2023-01-20 NOTE — Progress Notes (Signed)
[name], [name] ([id number]) [id number] Page 1 of 2 Visit Report for 01/19/2023 Problem List Details Patient Name: Date of Service: [name] 01/19/2023 8:00 A M Medical Record Number: [id number] Patient Account Number: [id number] Date of Birth/Sex: Treating RN: 11/25/1946 (77 y.o. 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 01/19/2023 Medical Record Number: [id number] Patient Account Number: [id number] Date of Birth/Sex: Treating RN: 10/24/1946 (77 y.o. 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 C61 Malignant neoplasm of prostate Facility Procedures : CPT4 Code Description: [id number] G0277-(Facility Use Only) HBOT  full body chamber, 2mn , ICD-10 Diagnosis Description N30.41 Irradiation cystitis with hematuria C61 Malignant neoplasm of prostate E11.21 Type 2 diabetes mellitus with diabetic n Modifier: ephropathy Quantity: 4 Physician Procedures : CPT4 Code Description Modifier [id number] 35701- WC PHYS HYPERBARIC OXYGEN THERAPY ICD-10 Diagnosis Description N30.41 Irradiation cystitis with hematuria C61 Malignant neoplasm of prostate 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

## 2023-01-20 NOTE — Progress Notes (Signed)
DEADRIAN, TOYA ([id number]) [id number].pdf Page 1 of 2 Visit Report for 01/20/2023 Arrival Information Details Patient Name: Date of Service: [name] 01/20/2023 8:00 [name] M Medical Record Number: [id number] Patient Account Number: [id number] Date of Birth/Sex: Treating RN: 05/06/1946 (77 y.o. [name] Primary Care [name]: [name] Other Clinician: Valeria Batman Referring [name]: Treating [name] Weeks in Treatment: 4 Visit Information History Since Last Visit Added or deleted any medications: No Patient Arrived: [name] Any new allergies or adverse reactions: No Arrival Time: 07:29 Had [name] fall or experienced change in No Accompanied By: None activities of daily living that may affect Transfer Assistance: None risk of falls: Patient Identification Verified: Yes Signs or symptoms of abuse/neglect since last visito No Secondary Verification Process Completed: Yes Hospitalized since last visit: No Patient Requires Transmission-Based Precautions: No Implantable device outside of the clinic excluding No Patient Has Alerts: Yes cellular tissue based products placed in the center Patient Alerts: Patient on [name] since last visit: Pain Present Now: No Electronic Signature(s) Signed: 01/20/2023 1:27:43 PM By: Valeria Batman EMT Entered By: Valeria Batman on 01/20/2023 13:27:42 -------------------------------------------------------------------------------- Encounter Discharge Information Details Patient Name: Date of Service: [name], [name] 01/20/2023 8:00 [name] M Medical Record Number: [id number] Patient Account Number: [id number] Date of Birth/Sex: Treating RN: 24-Jul-1946 (77 y.o. [name] Primary Care Arnet Hofferber: [name] Other Clinician: Valeria Batman Referring Annalicia Renfrew: Treating Annalia Metzger/Extender: [name], Jessica [name] Weeks in Treatment: 4 Encounter  Discharge Information Items Discharge Condition: Stable Ambulatory Status: Cane Discharge Destination: Home Transportation: Private Auto Accompanied By: None Schedule Follow-up Appointment: Yes Clinical Summary of Care: Electronic Signature(s) Signed: 01/20/2023 1:34:30 PM By: Valeria Batman EMT Entered By: Valeria Batman on 01/20/2023 13:34:30 [name] ([id number]) [id number] Page 2 of 2 -------------------------------------------------------------------------------- Vitals Details Patient Name: Date of Service: [name] 01/20/2023 8:00 [name] M Medical Record Number: [id number] Patient Account Number: [id number] Date of Birth/Sex: Treating RN: 1946-03-30 (77 y.o. [name] Primary Care Bama Hanselman: [name] Other Clinician: Valeria Batman Referring Parminder Cupples: Treating Kenan Moodie/Extender: [name], Jessica [name] Weeks in Treatment: 4 Vital Signs Time Taken: 07:45 Temperature (F): 98.6 Height (in): 71 Pulse (bpm): 84 Weight (lbs): 252 Respiratory Rate (breaths/min): 16 Body Mass Index (BMI): 35.1 [name]: 149/75 Capillary [name]: 204 Reference Range: 80 - 120 mg / dl Electronic Signature(s) Signed: 01/20/2023 1:28:43 PM By: Valeria Batman EMT Entered By: Valeria Batman on 01/20/2023 13:28:43

## 2023-01-20 NOTE — Progress Notes (Signed)
LASON, EVELAND ([id number]) [id number] Page 1 of 2 Visit Report for 01/20/2023 Problem List Details Patient Name: Date of Service: [name] 01/20/2023 8:00 A M Medical Record Number: [id number] Patient Account Number: [id number] Date of Birth/Sex: Treating RN: 1946/06/24 (77 y.o. 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 01/20/2023 Medical Record Number: [id number] Patient Account Number: [id number] Date of Birth/Sex: Treating RN: 01-26-1946 (77 y.o. 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 C61 Malignant neoplasm of prostate Facility Procedures : CPT4 Code Description: [id number] G0277-(Facility Use Only) HBOT  full body chamber, 61mn , ICD-10 Diagnosis Description N30.41 Irradiation cystitis with hematuria C61 Malignant neoplasm of prostate E11.21 Type 2 diabetes mellitus with diabetic n Modifier: ephropathy Quantity: 4 Physician Procedures : CPT4 Code Description Modifier [id number] 35329- WC PHYS HYPERBARIC OXYGEN THERAPY ICD-10 Diagnosis Description N30.41 Irradiation cystitis with hematuria C61 Malignant neoplasm of prostate 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

## 2023-01-21 ENCOUNTER — Encounter (HOSPITAL_BASED_OUTPATIENT_CLINIC_OR_DEPARTMENT_OTHER): Payer: Medicare Other | Admitting: Internal Medicine

## 2023-01-21 DIAGNOSIS — L598 Other specified disorders of the skin and subcutaneous tissue related to radiation: Secondary | ICD-10-CM | POA: Diagnosis not present

## 2023-01-21 DIAGNOSIS — E1140 Type 2 diabetes mellitus with diabetic neuropathy, unspecified: Secondary | ICD-10-CM | POA: Diagnosis not present

## 2023-01-21 DIAGNOSIS — L97512 Non-pressure chronic ulcer of other part of right foot with fat layer exposed: Secondary | ICD-10-CM | POA: Diagnosis not present

## 2023-01-21 DIAGNOSIS — E11621 Type 2 diabetes mellitus with foot ulcer: Secondary | ICD-10-CM | POA: Diagnosis not present

## 2023-01-21 DIAGNOSIS — I129 Hypertensive chronic kidney disease with stage 1 through stage 4 chronic kidney disease, or unspecified chronic kidney disease: Secondary | ICD-10-CM | POA: Diagnosis not present

## 2023-01-21 DIAGNOSIS — E1122 Type 2 diabetes mellitus with diabetic chronic kidney disease: Secondary | ICD-10-CM | POA: Diagnosis not present

## 2023-01-21 DIAGNOSIS — E785 Hyperlipidemia, unspecified: Secondary | ICD-10-CM | POA: Diagnosis not present

## 2023-01-21 LAB — GLUCOSE, CAPILLARY
Glucose-Capillary: 231 mg/dL — ABNORMAL HIGH (ref 70–99)
Glucose-Capillary: 221 mg/dL — ABNORMAL HIGH (ref 70–99)

## 2023-01-21 NOTE — Progress Notes (Signed)
SAHEED, CARRINGTON ([id number]) [id number] Page 1 of 2 Visit Report for 01/21/2023 Arrival Information Details Patient Name: Date of Service: [name] 01/21/2023 8:00 A M Medical Record Number: [id number] Patient Account Number: [id number] Date of Birth/Sex: Treating RN: 1946-07-20 (77 y.o. [name] Primary Care [name]: [name] Other Clinician: Donavan [name] Referring Vianne Grieshop: Treating [name]/Extender: [name] [name] [name] in Treatment: 4 Visit Information History Since Last Visit All ordered tests and consults were completed: Yes Patient Arrived: [name] Added or deleted any medications: No Arrival Time: 07:32 Any new allergies or adverse reactions: No Accompanied By: None Had a fall or experienced change in No Transfer Assistance: None activities of daily living that may affect Patient Identification Verified: Yes risk of falls: Secondary Verification Process Completed: Yes Signs or symptoms of abuse/neglect since last visito No Patient Requires Transmission-Based Precautions: No Hospitalized since last visit: No Patient Has Alerts: Yes Implantable device outside of the clinic excluding No Patient Alerts: Patient on [name] cellular tissue based products placed in the center since last visit: Pain Present Now: No Electronic Signature(s) Signed: 01/21/2023 1:34:12 PM By: Valeria Batman EMT Entered By: Valeria Batman on 01/21/2023 13:34:12 -------------------------------------------------------------------------------- Encounter Discharge Information Details Patient Name: Date of Service: [name], [name] 01/21/2023 8:00 A M Medical Record Number: [id number] Patient Account Number: [id number] Date of Birth/Sex: Treating RN: 12-Jul-1946 (77 y.o. [name] Primary Care Mckynlee Luse: [name] Other Clinician: Valeria Batman Referring Cristabel Bicknell: Treating Gared Gillie/Extender: Robson,  [name] [name] [name] in Treatment: 4 Encounter Discharge Information Items Discharge Condition: Stable Ambulatory Status: Cane Discharge Destination: Home Transportation: Private Auto Accompanied By: None Schedule Follow-up Appointment: Yes Clinical Summary of Care: Electronic Signature(s) Signed: 01/21/2023 1:40:06 PM By: Valeria Batman EMT Entered By: Valeria Batman on 01/21/2023 13:40:06 [name] ([id number]) [id number] Page 2 of 2 -------------------------------------------------------------------------------- Vitals Details Patient Name: Date of Service: [name] 01/21/2023 8:00 A M Medical Record Number: [id number] Patient Account Number: [id number] Date of Birth/Sex: Treating RN: [birth year] (77 y.o. [name] Primary Care Aldena Worm: [name] Other Clinician: Valeria Batman Referring Taila Basinski: Treating [name]/Extender: [name] [name] [name] in Treatment: 4 Vital Signs Time Taken: 07:46 Capillary [name]: 231 Height (in): 71 Reference Range: 80 - 120 mg / dl Weight (lbs): 252 Body Mass Index (BMI): 35.1 Electronic Signature(s) Signed: 01/21/2023 1:36:28 PM By: Valeria Batman EMT Entered By: Valeria Batman on 01/21/2023 13:36:28

## 2023-01-21 NOTE — Progress Notes (Signed)
JAWARA, LATORRE ([id number]) [phone].pdf Page 1 of 2 Visit Report for 01/21/2023 Problem List Details Patient Name: Date of Service: [name]. 01/21/2023 8:00 A M Medical Record Number: [id number] Patient Account Number: [id number] Date of Birth/Sex: Treating RN: 03/17/1946 (77 y.o. 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 01/21/2023 Medical Record Number: [id number] Patient Account Number: [id number] Date of Birth/Sex: Treating RN: 1946/09/17 (77 y.o. 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 C61 Malignant neoplasm of prostate Facility Procedures : CPT4 Code Description: [id number] G0277-(Facility Use Only) HBOT full  body chamber, 70mn , ICD-10 Diagnosis Description N30.41 Irradiation cystitis with hematuria C61 Malignant neoplasm of prostate Modifier: Quantity: 4 Physician Procedures : CPT4 Code Description Modifier [id number] 50354- WC PHYS HYPERBARIC OXYGEN THERAPY ICD-10 Diagnosis Description N30.41 Irradiation cystitis with hematuria C61 Malignant neoplasm of prostate Quantity: 1 Electronic Signature(s) Signed: 01/21/2023 1:39:29 PM By: GValeria BatmanEMT Signed: 01/21/2023 3:38:34 PM By: RLinton HamMD Entered By: GValeria Batmanon 01/21/2023 13:39:29

## 2023-01-21 NOTE — Progress Notes (Addendum)
[name], [name] ([id number]) [id number] Page 1 of 2 Visit Report for 01/21/2023 HBO Details Patient Name: Date of Service: [name] 01/21/2023 8:00 A M Medical Record Number: [id number] Patient Account Number: [id number] Date of Birth/Sex: Treating RN: August 23, 1946 (77 y.o. [name] Primary Care Leane Loring: [name], [name] Other Clinician: Valeria Batman Referring Dashae Wilcher: Treating Lydiann Bonifas/Extender: Robson, Michael [name], [name] in Treatment: 4 HBO Treatment Course Details Treatment Course Number: 1 Ordering Caige Almeda: [name] T Treatments Ordered: otal 40 HBO Treatment Start Date: 01/05/2023 HBO Indication: Late Effect of Radiation HBO Treatment Details Treatment Number: 13 Patient Type: Outpatient Chamber Type: Monoplace Chamber Serial #: [id number] Treatment Protocol: 2.5 ATA with 90 minutes oxygen, with two 5 minute air breaks Treatment Details Compression Rate Down: 2.0 psi / minute De-Compression Rate Up: 2.0 psi / minute A breaks and breathing ir Compress Tx Pressure periods Decompress Decompress Begins Reached (leave unused spaces Begins Ends blank) Chamber Pressure (ATA 1 2.5 2.5 2.5 2.5 2.5 - - 2.5 1 ) Clock Time (24 hr) 08:02 08:14 08:44 08:49 09:19 09:24 - - 09:54 10:05 Treatment Length: 123 (minutes) Treatment Segments: 4 Vital Signs Capillary Blood Glucose Reference Range: 80 - 120 mg / dl HBO Diabetic Blood Glucose Intervention Range: <131 mg/dl or >249 mg/dl Time Vitals Blood Respiratory Capillary Blood Glucose Pulse Action Type: Pulse: Temperature: Taken: Pressure: Rate: Glucose (mg/dl): Meter #: Oximetry (%) Taken: Pre 07:46 231 Post 10:12 173/75 20 98.1 221 Treatment Response Treatment Toleration: Well Treatment Completion Status: Treatment Completed without Adverse Event Treatment Notes The patient used Afrin before treatment today. Corbett Moulder Notes No concerns with treatment given Electronic  Signature(s) Signed: 01/21/2023 3:38:34 PM By: Linton Ham MD Previous Signature: 01/21/2023 1:39:06 PM Version By: Valeria Batman EMT Entered By: Linton Ham on 01/21/2023 15:36:25 HBO Safety Checklist Details -------------------------------------------------------------------------------- [name] ([id number]) [id number] Page 2 of 2 Patient Name: Date of Service: [name] 01/21/2023 8:00 A M Medical Record Number: [id number] Patient Account Number: [id number] Date of Birth/Sex: Treating RN: 02/13/1946 (77 y.o. [name] Primary Care Aislinn Feliz: [name], [name] Other Clinician: Valeria Batman Referring Myrtice Lowdermilk: Treating [name]/Extender: Robson, Michael [name], [name] in Treatment: 4 HBO Safety Checklist Items Safety Checklist Consent Form Signed Patient voided / foley secured and emptied When did you last eato 0630 Last dose of injectable or oral agent 0630 Ostomy pouch emptied and vented if applicable NA All implantable devices assessed, documented and approved Bard PowerPort ClearVUE Intravenous access site secured and place NA Valuables secured Linens and cotton and cotton/polyester blend (less than 51% polyester) Personal oil-based products / skin lotions / body lotions removed Wigs or hairpieces removed NA Smoking or tobacco materials removed Books / newspapers / magazines / loose paper removed Cologne, aftershave, perfume and deodorant removed Jewelry removed (may wrap wedding band) Make-up removed NA Hair care products removed Battery operated devices (external) removed Heating patches and chemical warmers removed Titanium eyewear removed NA Nail polish cured greater than 10 hours NA Casting material cured greater than 10 hours NA Hearing aids removed NA Loose dentures or partials removed NA Prosthetics have been removed NA Patient demonstrates correct use of air break device (if  applicable) Patient concerns have been addressed Patient grounding bracelet on and cord attached to chamber Specifics for Inpatients (complete in addition to above) Medication sheet sent with patient NA Intravenous medications needed or due during therapy sent with patient NA Drainage tubes (e.g. nasogastric tube or chest tube secured and  vented) NA Endotracheal or Tracheotomy tube secured NA Cuff deflated of air and inflated with saline NA Airway suctioned NA Notes The safety checklist was done before the treatment was started. Electronic Signature(s) Signed: 01/21/2023 1:37:35 PM By: Valeria Batman EMT Entered By: Valeria Batman on 01/21/2023 13:37:35

## 2023-01-22 ENCOUNTER — Encounter (HOSPITAL_BASED_OUTPATIENT_CLINIC_OR_DEPARTMENT_OTHER): Payer: Medicare Other | Admitting: Internal Medicine

## 2023-01-22 DIAGNOSIS — N3041 Irradiation cystitis with hematuria: Secondary | ICD-10-CM | POA: Diagnosis not present

## 2023-01-22 DIAGNOSIS — R310 Gross hematuria: Secondary | ICD-10-CM | POA: Diagnosis not present

## 2023-01-22 DIAGNOSIS — E1122 Type 2 diabetes mellitus with diabetic chronic kidney disease: Secondary | ICD-10-CM | POA: Diagnosis not present

## 2023-01-22 DIAGNOSIS — I129 Hypertensive chronic kidney disease with stage 1 through stage 4 chronic kidney disease, or unspecified chronic kidney disease: Secondary | ICD-10-CM | POA: Diagnosis not present

## 2023-01-22 DIAGNOSIS — C61 Malignant neoplasm of prostate: Secondary | ICD-10-CM | POA: Diagnosis not present

## 2023-01-22 DIAGNOSIS — L97512 Non-pressure chronic ulcer of other part of right foot with fat layer exposed: Secondary | ICD-10-CM

## 2023-01-22 DIAGNOSIS — E1140 Type 2 diabetes mellitus with diabetic neuropathy, unspecified: Secondary | ICD-10-CM | POA: Diagnosis not present

## 2023-01-22 DIAGNOSIS — E11621 Type 2 diabetes mellitus with foot ulcer: Secondary | ICD-10-CM

## 2023-01-22 DIAGNOSIS — Z8546 Personal history of malignant neoplasm of prostate: Secondary | ICD-10-CM | POA: Diagnosis not present

## 2023-01-22 DIAGNOSIS — E785 Hyperlipidemia, unspecified: Secondary | ICD-10-CM | POA: Diagnosis not present

## 2023-01-22 DIAGNOSIS — R3915 Urgency of urination: Secondary | ICD-10-CM | POA: Diagnosis not present

## 2023-01-22 LAB — GLUCOSE, CAPILLARY
Glucose-Capillary: 214 mg/dL — ABNORMAL HIGH (ref 70–99)
Glucose-Capillary: 253 mg/dL — ABNORMAL HIGH (ref 70–99)

## 2023-01-22 NOTE — Progress Notes (Signed)
DEQUON, SCHNEBLY ([id number]) [id number] Page 1 of 1 Visit Report for 01/22/2023 [name] Details Patient Name: Date of Service: [place]. 01/22/2023 Medical Record Number: [id number] Patient Account Number: [id number] Date of Birth/Sex: Treating RN: Aug 21, 1946 (77 y.o. 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 C61 Malignant neoplasm of prostate E11.621 Type 2 diabetes mellitus with foot ulcer L97.512 Non-pressure chronic ulcer of other part of right foot with fat layer exposed Facility Procedures CPT4 Code Description Modifier Quantity [id number] G0277-(Facility Use Only) HBOT full body chamber, 64mn , 4 ICD-10 Diagnosis Description N30.41 Irradiation cystitis with hematuria C61 Malignant neoplasm of prostate E11.621 Type 2 diabetes mellitus with foot ulcer Physician Procedures Quantity CPT4 Code Description Modifier [id number] 85027- WC PHYS HYPERBARIC OXYGEN THERAPY 1 ICD-10 Diagnosis Description N30.41 Irradiation cystitis with hematuria C61 Malignant neoplasm of prostate E11.621 Type 2 diabetes mellitus with foot ulcer Electronic Signature(s) Signed: 01/22/2023 1:34:24 PM By: SDonavan BurnetCHT EMT BS , , Signed: 01/22/2023 3:04:17 PM By: HKalman ShanDO Entered By: SDonavan Burneton 01/22/2023 13:34:24

## 2023-01-22 NOTE — Progress Notes (Addendum)
SHAYMUS, EVELETH ([id number]) [id number] Page 1 of 2 Visit Report for 01/22/2023 HBO Details Patient Name: Date of Service: [name]. 01/22/2023 8:00 A M Medical Record Number: [id number] Patient Account Number: [id number] Date of Birth/Sex: Treating RN: July 02, 1946 (77 y.o. [name], [name] Primary Care Phelix Fudala: [name], [name] Other Clinician: [name] Referring Shahira Fiske: Treating Liliani Bobo/Extender: [name], [name], [name] in Treatment: 4 HBO Treatment Course Details Treatment Course Number: 1 Ordering Tylie Golonka: [name] T Treatments Ordered: otal 40 HBO Treatment Start Date: 01/05/2023 HBO Indication: Late Effect of Radiation HBO Treatment Details Treatment Number: 14 Patient Type: Outpatient Chamber Type: Monoplace Chamber Serial #: [id number] Treatment Protocol: 2.5 ATA with 90 minutes oxygen, with two 5 minute air breaks Treatment Details Compression Rate Down: 1.5 psi / minute De-Compression Rate Up: A breaks and breathing ir Compress Tx Pressure periods Decompress Decompress Begins Reached (leave unused spaces Begins Ends blank) Chamber Pressure (ATA 1 2.5 2.5 2.5 2.5 2.5 - - 2.5 1 ) Clock Time (24 hr) 07:56 08:14 08:44 08:49 09:19 09:24 - - 09:54 10:03 Treatment Length: 127 (minutes) Treatment Segments: 4 Vital Signs Capillary [name] Reference Range: 80 - 120 mg / dl HBO Diabetic [name] Intervention Range: <131 mg/dl or >249 mg/dl Type: Time Vitals [name] Pulse: Respiratory Temperature: Capillary [name] Pulse Action Taken: Pressure: Rate: Glucose (mg/dl): Meter #: Oximetry (%) Taken: Pre 07:47 157/77 86 18 97.7 214 1 none per protocol Post 10:13 147/84 84 18 98.1 253 1 patient asymptomatic for hyperglycemia Treatment Response Treatment Toleration: Well Treatment Completion Status: Treatment Completed without Adverse Event Treatment Notes Patient arrived, prepared for treatment, [name]  glucose level was 214 mg/dL. Safety check performed and patient placed in the chamber travelling at 1 psi/min until he verified proper ear equalization at 3 psig. Rate set was increased to 2 psi/min travelling at that speed until he reached treatment depth. [facility] 01/22/2023 0847) Patient tolerated treatment and decompression of the chamber at 2 psi/min. Post treatment glucose level was 253 mg/dL. Patient denied symptoms of hyperglycemia and states he will take insulin afterward. Patient denied any issues related to ear equalization and/or pain. Patient was stable upon discharge. Physician HBO Attestation: I certify that I supervised this HBO treatment in accordance with Medicare guidelines. A trained emergency response team is readily available per Yes hospital policies and procedures. Continue HBOT as ordered. Yes Electronic Signature(s) Signed: 01/22/2023 3:04:17 PM By: [name] DO Previous Signature: 01/22/2023 1:33:55 PM Version By: [name] , , Previous Signature: 01/22/2023 8:48:03 AM Version By: [name] , , Previous Signature: 01/22/2023 8:45:20 AM Version By: [name] , , Entered By: [name] on 01/22/2023 15:03:28 [name] ([id number]) [id number] Page 2 of 2 -------------------------------------------------------------------------------- HBO Safety Checklist Details Patient Name: Date of Service: [name]. 01/22/2023 8:00 A M Medical Record Number: [id number] Patient Account Number: [id number] Date of Birth/Sex: Treating RN: April 13, 1946 (77 y.o. [name] Primary Care Dayonna Selbe: [name], [name] Other Clinician: [name] Referring Naelle Diegel: Treating Leo Fray/Extender: [name], [name], [name] in Treatment: 4 HBO Safety Checklist Items Safety Checklist Consent Form Signed Patient voided / foley secured and emptied When did you last eato  0630 Last dose of injectable or oral agent 0640 Ostomy pouch emptied and vented if applicable NA All implantable devices assessed, documented and approved Bard PowerPort ClearVUE Intravenous access site secured and place NA Valuables secured Linens and cotton  and cotton/polyester blend (less than 51% polyester) Personal oil-based products / skin lotions / body lotions removed Wigs or hairpieces removed NA Smoking or tobacco materials removed NA Books / newspapers / magazines / loose paper removed Cologne, aftershave, perfume and deodorant removed Jewelry removed (may wrap wedding band) Make-up removed NA Hair care products removed Battery operated devices (external) removed Heating patches and chemical warmers removed Titanium eyewear removed NA Nail polish cured greater than 10 hours NA Casting material cured greater than 10 hours NA Hearing aids removed NA Loose dentures or partials removed NA Prosthetics have been removed NA Patient demonstrates correct use of air break device (if applicable) Patient concerns have been addressed Patient grounding bracelet on and cord attached to chamber Specifics for Inpatients (complete in addition to above) Medication sheet sent with patient NA Intravenous medications needed or due during therapy sent with patient NA Drainage tubes (e.g. nasogastric tube or chest tube secured and vented) NA Endotracheal or Tracheotomy tube secured NA Cuff deflated of air and inflated with saline NA Airway suctioned NA Notes Paper version used prior to treatment Electronic Signature(s) Signed: 01/22/2023 1:30:43 PM By: [name] , , Previous Signature: 01/22/2023 8:44:33 AM Version By: [name] , , Entered By: [name] on 01/22/2023 13:30:43

## 2023-01-22 NOTE — Progress Notes (Addendum)
ALISTAIR, SENFT ([id number]) [id number].pdf Page 1 of 2 Visit Report for 01/22/2023 Arrival Information Details Patient Name: Date of Service: [name] 01/22/2023 8:00 A M Medical Record Number: [id number] Patient Account Number: [id number] Date of Birth/Sex: Treating RN: 04-03-1946 (77 y.o. [name] Primary Care Salisa Broz: [name], [name] Other Clinician: [name] Referring Hyun Marsalis: Treating Diyan Dave/Extender: [name], [name], [name] in Treatment: 4 Visit Information History Since Last Visit All ordered tests and consults were completed: Yes Patient Arrived: [name] Added or deleted any medications: No Arrival Time: 07:30 Any new allergies or adverse reactions: No Accompanied By: self Had a fall or experienced change in No Transfer Assistance: None activities of daily living that may affect Patient Identification Verified: Yes risk of falls: Secondary Verification Process Completed: Yes Signs or symptoms of abuse/neglect since last visito No Patient Requires Transmission-Based Precautions: No Hospitalized since last visit: No Patient Has Alerts: Yes Implantable device outside of the clinic excluding No Patient Alerts: Patient on Blood Thinner cellular tissue based products placed in the center since last visit: Pain Present Now: No Electronic Signature(s) Signed: 01/22/2023 8:42:21 AM By: [name] , , Entered By: [name] on 01/22/2023 08:42:21 -------------------------------------------------------------------------------- Encounter Discharge Information Details Patient Name: Date of Service: [facility], [name] 01/22/2023 8:00 A M Medical Record Number: [id number] Patient Account Number: [id number] Date of Birth/Sex: Treating RN: 09/21/1946 (77 y.o. [name] Primary Care Aidan Moten: [name], [name] Other Clinician: Referring Othmar Ringer: Treating Ileta Ofarrell/Extender: [name],  [name], [name] in Treatment: 4 Encounter Discharge Information Items Discharge Condition: Stable Ambulatory Status: Cane Discharge Destination: Home Transportation: Private Auto Accompanied By: self Schedule Follow-up Appointment: No Clinical Summary of Care: Electronic Signature(s) Signed: 01/22/2023 1:35:21 PM By: [name] , , Entered By: [name] on 01/22/2023 13:35:21 Ruz, [name] ([id number]) [id number] Page 2 of 2 -------------------------------------------------------------------------------- Vitals Details Patient Name: Date of Service: [name] 01/22/2023 8:00 A M Medical Record Number: [id number] Patient Account Number: [id number] Date of Birth/Sex: Treating RN: 15-Sep-1946 (77 y.o. [name] Primary Care Stella Bortle: [name], [name] Other Clinician: [name] Referring Loki Wuthrich: Treating Dakoda Bassette/Extender: [name], [name], [name] in Treatment: 4 Vital Signs Time Taken: 07:47 Temperature (F): 97.7 Height (in): 71 Pulse (bpm): 86 Weight (lbs): 252 Respiratory Rate (breaths/min): 18 Body Mass Index (BMI): 35.1 Blood Pressure (mmHg): 157/77 Capillary Blood Glucose (mg/dl): 214 Reference Range: 80 - 120 mg / dl Electronic Signature(s) Signed: 01/22/2023 8:42:59 AM By: [name] , , Entered By: [name] on 01/22/2023 08:42:58

## 2023-01-23 ENCOUNTER — Encounter: Payer: Self-pay | Admitting: Nurse Practitioner

## 2023-01-23 ENCOUNTER — Inpatient Hospital Stay: Payer: Medicare Other | Admitting: Nurse Practitioner

## 2023-01-23 ENCOUNTER — Inpatient Hospital Stay: Payer: Medicare Other | Attending: Nurse Practitioner

## 2023-01-23 ENCOUNTER — Inpatient Hospital Stay: Payer: Medicare Other

## 2023-01-23 ENCOUNTER — Encounter (HOSPITAL_BASED_OUTPATIENT_CLINIC_OR_DEPARTMENT_OTHER): Payer: Medicare Other | Admitting: Internal Medicine

## 2023-01-23 ENCOUNTER — Inpatient Hospital Stay (HOSPITAL_BASED_OUTPATIENT_CLINIC_OR_DEPARTMENT_OTHER): Payer: Medicare Other | Admitting: Nurse Practitioner

## 2023-01-23 VITALS — BP 142/71 | HR 82 | Temp 98.2°F | Resp 18 | Ht 71.0 in | Wt 262.0 lb

## 2023-01-23 DIAGNOSIS — L97512 Non-pressure chronic ulcer of other part of right foot with fat layer exposed: Secondary | ICD-10-CM | POA: Diagnosis not present

## 2023-01-23 DIAGNOSIS — I10 Essential (primary) hypertension: Secondary | ICD-10-CM | POA: Insufficient documentation

## 2023-01-23 DIAGNOSIS — Z923 Personal history of irradiation: Secondary | ICD-10-CM | POA: Diagnosis not present

## 2023-01-23 DIAGNOSIS — D649 Anemia, unspecified: Secondary | ICD-10-CM | POA: Insufficient documentation

## 2023-01-23 DIAGNOSIS — Z8546 Personal history of malignant neoplasm of prostate: Secondary | ICD-10-CM | POA: Diagnosis not present

## 2023-01-23 DIAGNOSIS — L97519 Non-pressure chronic ulcer of other part of right foot with unspecified severity: Secondary | ICD-10-CM | POA: Diagnosis not present

## 2023-01-23 DIAGNOSIS — I129 Hypertensive chronic kidney disease with stage 1 through stage 4 chronic kidney disease, or unspecified chronic kidney disease: Secondary | ICD-10-CM | POA: Diagnosis not present

## 2023-01-23 DIAGNOSIS — N3041 Irradiation cystitis with hematuria: Secondary | ICD-10-CM | POA: Diagnosis not present

## 2023-01-23 DIAGNOSIS — E1159 Type 2 diabetes mellitus with other circulatory complications: Secondary | ICD-10-CM | POA: Insufficient documentation

## 2023-01-23 DIAGNOSIS — C61 Malignant neoplasm of prostate: Secondary | ICD-10-CM | POA: Diagnosis not present

## 2023-01-23 DIAGNOSIS — C251 Malignant neoplasm of body of pancreas: Secondary | ICD-10-CM

## 2023-01-23 DIAGNOSIS — E11621 Type 2 diabetes mellitus with foot ulcer: Secondary | ICD-10-CM | POA: Diagnosis not present

## 2023-01-23 DIAGNOSIS — N289 Disorder of kidney and ureter, unspecified: Secondary | ICD-10-CM | POA: Insufficient documentation

## 2023-01-23 DIAGNOSIS — E785 Hyperlipidemia, unspecified: Secondary | ICD-10-CM | POA: Diagnosis not present

## 2023-01-23 DIAGNOSIS — E1122 Type 2 diabetes mellitus with diabetic chronic kidney disease: Secondary | ICD-10-CM | POA: Diagnosis not present

## 2023-01-23 DIAGNOSIS — E1140 Type 2 diabetes mellitus with diabetic neuropathy, unspecified: Secondary | ICD-10-CM | POA: Diagnosis not present

## 2023-01-23 LAB — CMP (CANCER CENTER ONLY)
ALT: 13 U/L (ref 0–44)
AST: 25 U/L (ref 15–41)
Albumin: 3.7 g/dL (ref 3.5–5.0)
Alkaline Phosphatase: 124 U/L (ref 38–126)
Anion gap: 9 (ref 5–15)
BUN: 27 mg/dL — ABNORMAL HIGH (ref 8–23)
CO2: 25 mmol/L (ref 22–32)
Calcium: 9.4 mg/dL (ref 8.9–10.3)
Chloride: 102 mmol/L (ref 98–111)
Creatinine: 1.36 mg/dL — ABNORMAL HIGH (ref 0.61–1.24)
GFR, Estimated: 53 mL/min — ABNORMAL LOW (ref >60)
Glucose, Bld: 195 mg/dL — ABNORMAL HIGH (ref 70–99)
Potassium: 4 mmol/L (ref 3.5–5.1)
Sodium: 136 mmol/L (ref 135–145)
Total Bilirubin: 0.6 mg/dL (ref 0.3–1.2)
Total Protein: 7.2 g/dL (ref 6.5–8.1)

## 2023-01-23 LAB — CBC WITH DIFFERENTIAL (CANCER CENTER ONLY)
Abs Immature Granulocytes: 0.02 10*3/uL (ref 0.00–0.07)
Basophils Absolute: 0 10*3/uL (ref 0.0–0.1)
Basophils Relative: 0 %
Eosinophils Absolute: 0.2 10*3/uL (ref 0.0–0.5)
Eosinophils Relative: 2 %
HCT: 35.3 % — ABNORMAL LOW (ref 39.0–52.0)
Hemoglobin: 12 g/dL — ABNORMAL LOW (ref 13.0–17.0)
Immature Granulocytes: 0 %
Lymphocytes Relative: 38 %
Lymphs Abs: 3.8 10*3/uL (ref 0.7–4.0)
MCH: 32.1 pg (ref 26.0–34.0)
MCHC: 34 g/dL (ref 30.0–36.0)
MCV: 94.4 fL (ref 80.0–100.0)
Monocytes Absolute: 1 10*3/uL (ref 0.1–1.0)
Monocytes Relative: 10 %
Neutro Abs: 4.8 10*3/uL (ref 1.7–7.7)
Neutrophils Relative %: 50 %
Platelet Count: 247 10*3/uL (ref 150–400)
RBC: 3.74 MIL/uL — ABNORMAL LOW (ref 4.22–5.81)
RDW: 13.8 % (ref 11.5–15.5)
WBC Count: 10 10*3/uL (ref 4.0–10.5)
nRBC: 0 % (ref 0.0–0.2)

## 2023-01-23 LAB — GLUCOSE, CAPILLARY
Glucose-Capillary: 170 mg/dL — ABNORMAL HIGH (ref 70–99)
Glucose-Capillary: 209 mg/dL — ABNORMAL HIGH (ref 70–99)

## 2023-01-23 NOTE — Progress Notes (Addendum)
[name], [name] ([id number] Page 1 of 2 Visit Report for 01/23/2023 HBO Details Patient Name: Date of Service: [name] 01/23/2023 8:00 A M Medical Record Number: [id number] Patient Account Number: [id number] Date of Birth/Sex: Treating RN: 28-Jun-1946 (77 y.o. [name] Primary Care Sheelah Ritacco: [name], [name] Other Clinician: Valeria Batman Referring [name]: Treating [name]/Extender: [name], [name], [name] in Treatment: 4 HBO Treatment Course Details Treatment Course Number: 1 Ordering [name]: [name] T Treatments Ordered: otal 40 HBO Treatment Start Date: 01/05/2023 HBO Indication: Late Effect of Radiation HBO Treatment Details Treatment Number: 15 Patient Type: Outpatient Chamber Type: Monoplace Chamber Serial #: [id number] Treatment Protocol: 2.5 ATA with 90 minutes oxygen, with two 5 minute air breaks Treatment Details Compression Rate Down: 1.5 psi / minute De-Compression Rate Up: A breaks and breathing ir Compress Tx Pressure periods Decompress Decompress Begins Reached (leave unused spaces Begins Ends blank) Chamber Pressure (ATA 1 2.5 2.5 2.5 2.5 2.5 - - 2.5 1 ) Clock Time (24 hr) 07:55 08:08 08:38 08:43 09:13 09:18 - - 09:48 09:59 Treatment Length: 124 (minutes) Treatment Segments: 4 Vital Signs Capillary [name] Reference Range: 80 - 120 mg / dl HBO Diabetic [name] Intervention Range: <131 mg/dl or >249 mg/dl Type: Time Vitals [name] Pulse: Respiratory Temperature: Capillary [name] Pulse Action Taken: Pressure: Rate: Glucose (mg/dl): Meter #: Oximetry (%) Taken: Pre 07:44 130/55 77 18 97.9 170 patient asymptomatic for hypotension Post 10:03 154/81 74 18 98.1 209 Treatment Response Treatment Toleration: Well Treatment Completion Status: Treatment Completed without Adverse Event Treatment Notes Patient arrived, prepared for treatment. [name] level was 170  mg/dL and patient stated he ate breakfast. He self-administered Afrin. After performing safety check, patient placed in the chamber. Chamber compressed at 1 psi/min until reaching 3 psig and patient confirmed normal ear equalization. Set rate was then chanbed to 2 psi/min. ([place] 01/23/23 [id number] Physician HBO Attestation: I certify that I supervised this HBO treatment in accordance with Medicare guidelines. A trained emergency response team is readily available per Yes hospital policies and procedures. Continue HBOT as ordered. Yes Electronic Signature(s) Signed: 01/26/2023 3:45:51 PM By: [name] DO Previous Signature: 01/23/2023 12:14:31 PM Version By: Valeria Batman EMT Previous Signature: 01/23/2023 12:26:02 PM Version By: [name] DO Previous Signature: 01/23/2023 9:39:10 AM Version By: [name] , , Entered By: [name] on 01/26/2023 13:17:22 [name] ([id number] Page 2 of 2 -------------------------------------------------------------------------------- HBO Safety Checklist Details Patient Name: Date of Service: [name] 01/23/2023 8:00 A M Medical Record Number: [id number] Patient Account Number: [id number] Date of Birth/Sex: Treating RN: 1946-07-11 (77 y.o. [name] Primary Care Satoru Milich: [name], [name] Other Clinician: [name] Referring [name]: Treating [name]/Extender: [name], [name], [name] in Treatment: 4 HBO Safety Checklist Items Safety Checklist Consent Form Signed Patient voided / foley secured and emptied When did you last eato 0630 Last dose of injectable or oral agent 0630 Ostomy pouch emptied and vented if applicable NA All implantable devices assessed, documented and approved Bard PowerPort ClearVUE Intravenous access site secured and place NA Valuables secured Linens and cotton and cotton/polyester blend (less than 51%  polyester) Personal oil-based products / skin lotions / body lotions removed Wigs or hairpieces removed NA Smoking or tobacco materials removed NA Books / newspapers / magazines / loose paper removed Cologne, aftershave, perfume and deodorant removed Jewelry removed (may wrap wedding band) Make-up removed NA Hair care products  removed Battery operated devices (external) removed Heating patches and chemical warmers removed Titanium eyewear removed Nail polish cured greater than 10 hours NA Casting material cured greater than 10 hours NA Hearing aids removed NA Loose dentures or partials removed NA Prosthetics have been removed NA Patient demonstrates correct use of air break device (if applicable) Patient concerns have been addressed Patient grounding bracelet on and cord attached to chamber Specifics for Inpatients (complete in addition to above) Medication sheet sent with patient NA Intravenous medications needed or due during therapy sent with patient NA Drainage tubes (e.g. nasogastric tube or chest tube secured and vented) NA Endotracheal or Tracheotomy tube secured NA Cuff deflated of air and inflated with saline NA Airway suctioned NA Notes Paper version used prior to treatment. Electronic Signature(s) Signed: 01/23/2023 9:36:08 AM By: [name] , , Entered By: [name] on 01/23/2023 09:36:08

## 2023-01-23 NOTE — Progress Notes (Signed)
BURWELL, HOAGE ([id number]) [id number] Page 1 of 2 Visit Report for 01/23/2023 Problem List Details Patient Name: Date of Service: [name] 01/23/2023 8:00 A M Medical Record Number: [id number] Patient Account Number: [id number] Date of Birth/Sex: Treating RN: 12-Feb-1946 (77 y.o. 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 12/22/2022 No Yes C61 Malignant neoplasm of prostate 12/22/2022 No Yes E11.621 Type 2 diabetes mellitus with foot ulcer 01/22/2023 No Yes L97.512 Non-pressure chronic ulcer of other part of right foot with fat 01/22/2023 No Yes layer exposed Inactive Problems Resolved Problems Electronic Signature(s) Signed: 01/23/2023 12:16:06 PM By: Valeria Batman EMT Signed: 01/23/2023 12:26:02 PM By: [name] DO Entered By: Valeria Batman on 01/23/2023 12:16:06 -------------------------------------------------------------------------------- [name] Details Patient Name: Date of Service: [facility] 01/23/2023 Medical Record Number: [id number] Patient Account Number: [id number] Date of Birth/Sex: Treating RN: 05-Oct-1946 (76 y.o. 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 Facility Procedures : CPT4 Code: [id number] Description: G0277-(Facility Use Only) HBOT full body chamber, 10mn , ICD-10 Diagnosis Description 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

## 2023-01-23 NOTE — Progress Notes (Addendum)
COTY, KACZMARCZYK ([id number].pdf Page 1 of 2 Visit Report for 01/23/2023 Arrival Information Details Patient Name: Date of Service: [name] 01/23/2023 8:00 [name] Medical Record Number: [id number] Patient Account Number: [id number] Date of Birth/Sex: Treating RN: 1946/07/26 (77 y.o. [name] Primary Care [name]: [name] Other Clinician: Donavan [name] Referring [name]: Treating [name]/Extender: [name], [name], [name] Weeks in Treatment: 4 Visit Information History Since Last Visit All ordered tests and consults were completed: Yes Patient Arrived: [name] Added or deleted any medications: No Arrival Time: 07:30 Any new allergies or adverse reactions: No Accompanied By: self Had [name] fall or experienced change in No Transfer Assistance: None activities of daily living that may affect Patient Identification Verified: Yes risk of falls: Secondary Verification Process Completed: Yes Signs or symptoms of abuse/neglect since last visito No Patient Requires Transmission-Based No Hospitalized since last visit: No Precautions: Implantable device outside of the clinic excluding No Patient Has Alerts: Yes cellular tissue based products placed in the center Patient Alerts: Patient on [name] since last visit: LTBI: 0.88 09/25/2021 Pain Present Now: No [facility] Electronic Signature(s) Signed: 01/23/2023 9:33:47 AM By: [name] CHT EMT BS , , Entered By: [name] on 01/23/2023 09:33:47 -------------------------------------------------------------------------------- Encounter Discharge Information Details Patient Name: Date of Service: [name], [name] 01/23/2023 8:00 [name] Medical Record Number: [id number] Patient Account Number: [id number] Date of Birth/Sex: Treating RN: Apr 02, 1946 (77 y.o. [name] Primary Care Mallarie Voorhies: [name] Other Clinician: Donavan [name] Referring  Tanaja Ganger: Treating [name]/Extender: [name], [name], [name] Weeks in Treatment: 4 Encounter Discharge Information Items Discharge Condition: Stable Ambulatory Status: Cane Discharge Destination: Home Transportation: Private Auto Accompanied By: None Schedule Follow-up Appointment: Yes Clinical Summary of Care: Electronic Signature(s) Signed: 01/23/2023 12:17:04 PM By: [name] EMT Entered By: [name] on 01/23/2023 12:17:04 [name] ([id number] Page 2 of 2 -------------------------------------------------------------------------------- Vitals Details Patient Name: Date of Service: [name] 01/23/2023 8:00 [name] Medical Record Number: [id number] Patient Account Number: [id number] Date of Birth/Sex: Treating RN: 04-Aug-1946 (77 y.o. [name] Primary Care Rogena Deupree: [name] Other Clinician: Donavan [name] Referring [name]: Treating [name]/Extender: [name], [name], [name] Weeks in Treatment: 4 Vital Signs Time Taken: 07:44 Temperature (F): 97.9 Height (in): 71 Pulse (bpm): 77 Weight (lbs): 252 Respiratory Rate (breaths/min): 18 Body Mass Index (BMI): 35.1 [name]: 130/55 Capillary [name]: 170 Reference Range: 80 - 120 mg / dl Electronic Signature(s) Signed: 01/23/2023 9:34:47 AM By: [name] CHT EMT BS , , Entered By: [name] on 01/23/2023 09:34:47

## 2023-01-23 NOTE — Progress Notes (Signed)
NEEDHAM, MACGOWAN ([id number]) [phone].pdf Page 1 of 8 Visit Report for 01/22/2023 Arrival Information Details Patient Name: Date of Service: [name] 01/22/2023 12:45 PM Medical Record Number: [id number] Patient Account Number: [id number] Date of Birth/Sex: Treating RN: 1945-12-30 (77 y.o. M) Primary Care Rion Schnitzer: [name], [name] Other Clinician: Referring Lamarius Dirr: Treating Morgen Ritacco/Extender: [name], [name], [name] in Treatment: 4 Visit Information History Since Last Visit Added or deleted any medications: No Patient Arrived: Ambulatory Any new allergies or adverse reactions: No Arrival Time: 12:46 Had [name] fall or experienced change in No Accompanied By: wife activities of daily living that may affect Transfer Assistance: None risk of falls: Patient Identification Verified: Yes Signs or symptoms of abuse/neglect since last visito No Secondary Verification Process Completed: Yes Hospitalized since last visit: No Patient Requires Transmission-Based No Implantable device outside of the clinic excluding No Precautions: cellular tissue based products placed in the center Patient Has Alerts: Yes since last visit: Patient Alerts: Patient on [name] Pain Present Now: Yes LTBI: 0.88 09/25/2021 [facility] Electronic Signature(s) Signed: 01/22/2023 5:24:12 PM By: Deon Pilling RN, BSN Entered By: Deon Pilling on 01/22/2023 16:36:20 -------------------------------------------------------------------------------- Encounter Discharge Information Details Patient Name: Date of Service: [facility], [name] 01/22/2023 12:45 PM Medical Record Number: [id number] Patient Account Number: [id number] Date of Birth/Sex: Treating RN: [birth year] (77 y.o. [name] Diener Primary Care Aleria Maheu: [name], [name] Other Clinician: Referring Riah Kehoe: Treating Coco Sharpnack/Extender: [name], [name], [name] in Treatment: 4 Encounter Discharge  Information Items Post Procedure Vitals Discharge Condition: Stable Unable to obtain vitals Reason: see post hyperbaric oxygen vital signs. Ambulatory Status: Cane Discharge Destination: Home Transportation: Private Auto Accompanied By: wife Schedule Follow-up Appointment: Yes Clinical Summary of Care: Electronic Signature(s) Signed: 01/22/2023 5:24:12 PM By: Deon Pilling RN, BSN Entered By: Deon Pilling on 01/22/2023 13:25:57 [name] ([id number].pdf Page 2 of 8 -------------------------------------------------------------------------------- Lower Extremity Assessment Details Patient Name: Date of Service: [name] 01/22/2023 12:45 PM Medical Record Number: [id number] Patient Account Number: [id number] Date of Birth/Sex: Treating RN: 1946/04/19 (77 y.o. M) Primary Care Keni Wafer: [name], [name] Other Clinician: Referring Devine Klingel: Treating Anabelen Kaminsky/Extender: [name], [name], [name] in Treatment: 4 Edema Assessment Assessed: [Left: No] [Right: No] [Left: Edema] [Right: :] Calf Left: Right: Point of Measurement: 38 cm From Medial Instep 44.6 cm Ankle Left: Right: Point of Measurement: 13 cm From Medial Instep 27 cm Knee To Floor Left: Right: From Medial Instep 47 cm Vascular Assessment Pulses: Dorsalis Pedis Palpable: [Right:Yes] [name] Pressure: Brachial: [Right:166] Ankle: [Right:Dorsalis Pedis: 250 1.51] Notes ABIs perform at [facility] 09/25/2021 Dr. Heber Dandridge would like to use these results. Electronic Signature(s) Signed: 01/22/2023 5:24:12 PM By: Deon Pilling RN, BSN Entered By: Deon Pilling on 01/22/2023 16:35:54 -------------------------------------------------------------------------------- Multi Wound Chart Details Patient Name: Date of Service: [name] 01/22/2023 12:45 PM Medical Record Number: [id number] Patient Account Number: [id number] Date of Birth/Sex: Treating RN: Apr 13, 1946  (76 y.o. M) Primary Care Anoushka Divito: [name], [name] Other Clinician: Referring Nizhoni Parlow: Treating Khairi Garman/Extender: [name], [name], [name] in Treatment: 4 [1:Photos: No Photos Right Metatarsal head fifth Wound Location: Gradually Appeared Wounding Event: Diabetic Wound/Ulcer of the Lower Primary Etiology: Extremity] [[name]:[name] [name] [name] [name]] [name], [name] ([id number]) [1:Anemia, Sleep Apnea, Arrhythmia, Comorbid History: Coronary Artery Disease, Hypertension, Myocardial Infarction, Type II Diabetes, Osteoarthritis, Neuropathy, Received Chemotherapy, Received Radiation 01/22/2022 Date Acquired: 0 Weeks of Treatment: Open  Wound Status: No Wound Recurrence: 0.2x0.2x0.3 Measurements L x W x  D (cm) 0.031 [name] (cm) : rea 0.009 Volume (cm) : Grade 1 Classification: Medium Exudate [name] mount: Serosanguineous Exudate Type: red, brown Exudate Color: Distinct, outline attached Wound  Margin: Large (67-100%) Granulation [name] mount: Red, Pink, Pale Granulation Quality: None Present (0%) Necrotic [name] mount: Fat Layer (Subcutaneous Tissue): Yes [name] Exposed Structures: Fascia: No Tendon: No Muscle: No Joint: No Bone: No None Epithelialization:  Debridement - Excisional Debridement: Pre-procedure Verification/Time Out 13:00 Taken: Lidocaine 4% Topical Solution Pain Control: Callus, Subcutaneous Tissue Debrided: Skin/Subcutaneous Tissue Level: 4 Debridement [name] (sq cm): rea Curette Instrument:  Minimum Bleeding: Pressure Hemostasis [name] chieved: 0 Procedural Pain: 0 Post Procedural Pain: Procedure was tolerated well Debridement Treatment Response: 0.2x0.2x0.3 Post Debridement Measurements L x W x D (cm) 0.009 Post Debridement Volume: (cm) Callus:  Yes Periwound Skin Texture: Excoriation: No Induration: No Crepitus: No Rash: No Scarring: No Dry/Scaly: Yes Periwound Skin Moisture: Maceration: No Atrophie Blanche: No Periwound Skin Color: Cyanosis: No Ecchymosis: No Erythema: No Hemosiderin  Staining: No Mottled: No Pallor:  No Rubor: No Debridement Procedures Performed:] [[name]:[name] [name] [name] [name] [name] [name] [name] [name] [name] [name] [name] [name] [name] [name] [name] [name] [name] [name] [name] [name] [name] [name] [name] [name] [name] [name] [name] [name] [name] [name] [name] [name] [name] [name] [name]] Treatment Notes Wound #1 (Metatarsal head fifth) Wound Laterality: Right Cleanser Wound Cleanser Discharge Instruction: Cleanse the wound with wound cleanser prior to applying [name] clean dressing using gauze sponges, not tissue or cotton balls. Peri-Wound Care Skin Prep Discharge Instruction: Use skin prep as directed Topical Primary Dressing MediHoney Gel, tube 1.5 (oz) Discharge Instruction: Apply to wound bed as instructed Sorbalgon AG Dressing 2x2 (in/in) Discharge Instruction: Apply to wound bed as instructed Secondary Dressing GAYLIN, HASMAN L ([id number] Page 4 of 8 Optifoam Non-Adhesive Dressing, 4x4 in Discharge Instruction: foam donut Woven Gauze Sponges 2x2 in Discharge Instruction: Apply over primary dressing as directed. Secured With Conforming Stretch Gauze Bandage, Sterile 2x75 (in/in) Discharge Instruction: Secure with stretch gauze as directed. Compression Wrap Compression Stockings Add-Ons Electronic Signature(s) Signed: 01/22/2023 3:04:17 PM By: [name] DO Entered By: [name] on 01/22/2023 13:51:24 -------------------------------------------------------------------------------- Multi-Disciplinary Care Plan Details Patient Name: Date of Service: [facility] [name] 01/22/2023 12:45 PM Medical Record Number: [id number] Patient Account Number: [id number] Date of Birth/Sex: Treating RN: November 04, 1946 (77 y.o. [name], [name] Primary Care Ruston Fedora: [name], [name] Other Clinician: Referring Brianna Bennett: Treating Shaletha Humble/Extender: [name], [name], [name] in Treatment: 4 Active Inactive HBO Nursing Diagnoses: Anxiety related to feelings of confinement associated with the hyperbaric oxygen chamber Potential for  barotraumas to ears, sinuses, teeth, and lungs or cerebral gas embolism related to changes in atmospheric pressure inside hyperbaric oxygen chamber Goals: Patient will tolerate the hyperbaric oxygen therapy treatment Date Initiated: 12/22/2022 Target Resolution Date: 02/13/2023 Goal Status: Active Patient/caregiver will verbalize understanding of HBO goals, rationale, procedures and potential hazards Date Initiated: 12/22/2022 Target Resolution Date: 02/13/2023 Goal Status: Active Interventions: Assess and provide for patients comfort related to the hyperbaric environment and equalization of middle ear Assess patient for any history of confinement anxiety Notes: Orientation to the Wound Care Program Nursing Diagnoses: Knowledge deficit related to the wound healing center program Goals: Patient/caregiver will verbalize understanding of the [place] Date Initiated: 12/22/2022 Target Resolution Date: 02/13/2023 Goal Status: Active Interventions: Provide education on orientation to the wound center Notes: MELFORD, SAWHNEY L ([id number]) [phone].pdf Page 5 of 8 Pain, Acute or Chronic Nursing Diagnoses: Pain, acute or chronic: actual or potential  Potential alteration in comfort, pain Goals: Patient will verbalize adequate pain control and receive pain control interventions during procedures as needed Date Initiated: 01/22/2023 Target Resolution Date: 02/18/2023 Goal Status: Active Interventions: Encourage patient to take pain medications as prescribed Provide education on pain management Treatment Activities: Administer pain control measures as ordered : 01/22/2023 Notes: Wound/Skin Impairment Nursing Diagnoses: Knowledge deficit related to ulceration/compromised skin integrity Goals: Patient/caregiver will verbalize understanding of skin care regimen Date Initiated: 01/22/2023 Target Resolution Date: 02/20/2023 Goal Status:  Active Interventions: Assess patient/caregiver ability to perform ulcer/skin care regimen upon admission and as needed Assess ulceration(s) every visit Provide education on ulcer and skin care Treatment Activities: Skin care regimen initiated : 01/22/2023 Topical wound management initiated : 01/22/2023 Notes: Electronic Signature(s) Signed: 01/22/2023 5:24:12 PM By: Deon Pilling RN, BSN Entered By: Deon Pilling on 01/22/2023 13:06:10 -------------------------------------------------------------------------------- Pain Assessment Details Patient Name: Date of Service: [place], [name] 01/22/2023 12:45 PM Medical Record Number: [id number] Patient Account Number: [id number] Date of Birth/Sex: Treating RN: Mar 13, 1946 (77 y.o. M) Primary Care Erianna Jolly: [name], [name] Other Clinician: Referring Grete Bosko: Treating Dejaun Vidrio/Extender: [name], [name], [name] in Treatment: 4 Active Problems Location of Pain Severity and Description of Pain Patient Has Paino Yes Site Locations Pain Location: GENO, DEBROUX ([id number]) [phone].pdf Page 6 of 8 Pain Location: Pain in Ulcers Rate the pain. Current Pain Level: 4 Pain Management and Medication Current Pain Management: Electronic Signature(s) Signed: 01/23/2023 1:13:01 PM By: Erenest Blank Entered By: Erenest Blank on 01/22/2023 12:47:04 -------------------------------------------------------------------------------- Patient/Caregiver Education Details Patient Name: Date of Service: [name] 2/8/2024andnbsp12:45 PM Medical Record Number: [id number] Patient Account Number: [id number] Date of Birth/Gender: Treating RN: [birth year] (77 y.o. [name] Diener Primary Care Physician: [name], [name] Other Clinician: Referring Physician: Treating Physician/Extender: [name], [name], [name] in Treatment: 4 Education Assessment Education Provided To: Patient Education Topics  Provided Wound/Skin Impairment: Handouts: Caring for Your Ulcer Methods: Explain/Verbal Responses: Reinforcements needed Electronic Signature(s) Signed: 01/22/2023 5:24:12 PM By: Deon Pilling RN, BSN Entered By: Deon Pilling on 01/22/2023 13:06:25 -------------------------------------------------------------------------------- Wound Assessment Details Patient Name: Date of Service: [place], [name] 01/22/2023 12:45 PM Medical Record Number: [id number] Patient Account Number: [id number] Date of Birth/Sex: Treating RN: Apr 30, 1946 (77 y.o. [name] Diener Primary Care Maikayla Beggs: [name], [name] Other Clinician: [name] ([id number]) [id number] Page 7 of 8 Referring Sotero Brinkmeyer: Treating Canesha Tesfaye/Extender: [name], [name], [name] in Treatment: 4 Wound Status Wound Number: 1 Primary Diabetic Wound/Ulcer of the Lower Extremity Etiology: Wound Location: Right Metatarsal head fifth Wound Open Wounding Event: Gradually Appeared Status: Date Acquired: 01/22/2022 Comorbid Anemia, Sleep Apnea, Arrhythmia, Coronary Artery Disease, Weeks Of Treatment: 0 History: Hypertension, Myocardial Infarction, Type II Diabetes, Clustered Wound: No Osteoarthritis, Neuropathy, Received Chemotherapy, Received Radiation Wound Measurements Length: (cm) 0.2 Width: (cm) 0.2 Depth: (cm) 0.3 Area: (cm) 0.031 Volume: (cm) 0.009 % Reduction in Area: % Reduction in Volume: Epithelialization: None Tunneling: No Undermining: No Wound Description Classification: Grade 1 Wound Margin: Distinct, outline attached Exudate Amount: Medium Exudate Type: Serosanguineous Exudate Color: red, brown Foul Odor After Cleansing: No Slough/Fibrino No Wound Bed Granulation Amount: Large (67-100%) Exposed Structure Granulation Quality: Red, Pink, Pale Fascia Exposed: No Necrotic Amount: None Present (0%) Fat Layer (Subcutaneous Tissue) Exposed: Yes Tendon Exposed:  No Muscle Exposed: No Joint Exposed: No Bone Exposed: No Periwound Skin Texture Texture Color No Abnormalities Noted: No No Abnormalities Noted: No Callus: Yes Atrophie Blanche: No Crepitus: No Cyanosis: No Excoriation: No  Ecchymosis: No Induration: No Erythema: No Rash: No Hemosiderin Staining: No Scarring: No Mottled: No Pallor: No Moisture Rubor: No No Abnormalities Noted: No Dry / Scaly: Yes Maceration: No Treatment Notes Wound #1 (Metatarsal head fifth) Wound Laterality: Right Cleanser Wound Cleanser Discharge Instruction: Cleanse the wound with wound cleanser prior to applying [name] clean dressing using gauze sponges, not tissue or cotton balls. Peri-Wound Care Skin Prep Discharge Instruction: Use skin prep as directed Topical Primary Dressing MediHoney Gel, tube 1.5 (oz) Discharge Instruction: Apply to wound bed as instructed Sorbalgon AG Dressing 2x2 (in/in) Discharge Instruction: Apply to wound bed as instructed Secondary Dressing Optifoam Non-Adhesive Dressing, 4x4 in Discharge Instruction: foam donut [name], Ioane L ([id number].pdf Page 8 of 8 Woven Gauze Sponges 2x2 in Discharge Instruction: Apply over primary dressing as directed. Secured With Conforming Stretch Gauze Bandage, Sterile 2x75 (in/in) Discharge Instruction: Secure with stretch gauze as directed. Compression Wrap Compression Stockings Add-Ons Electronic Signature(s) Signed: 01/22/2023 5:24:12 PM By: Deon Pilling RN, BSN Entered By: Deon Pilling on 01/22/2023 13:04:10 -------------------------------------------------------------------------------- Vitals Details Patient Name: Date of Service: [name], [name] 01/22/2023 12:45 PM Medical Record Number: [id number] Patient Account Number: [id number] Date of Birth/Sex: Treating RN: 12-Jan-1946 (76 y.o. M) Primary Care Tauheed Mcfayden: [name], [name] Other Clinician: Referring Wlliam Grosso: Treating  Bonni Neuser/Extender: [name], [name], [name] in Treatment: 4 Vital Signs Time Taken: 12:46 Reference Range: 80 - 120 mg / dl Height (in): 71 Weight (lbs): 252 Body Mass Index (BMI): 35.1 Electronic Signature(s) Signed: 01/23/2023 1:13:01 PM By: Erenest Blank Entered By: Erenest Blank on 01/22/2023 12:46:52

## 2023-01-23 NOTE — Progress Notes (Signed)
[name]   Diagnosis: Pancreas cancer  INTERVAL HISTORY:   [name] returns as scheduled.  He reports he is being followed at the wound clinic for a nonhealing right foot ulcer.  Other than pain associated with the foot ulcer he feels well.  He has a good appetite.  Weight overall stable.  No abdominal pain.  No constipation or diarrhea.  No nausea or vomiting.  Objective:  Vital signs in last 24 hours:  Blood pressure (!) 142/71, pulse 82, temperature 98.2 F (36.8 C), temperature source Oral, resp. rate 18, height 5' 11"$  (1.803 m), weight 262 lb (118.8 kg), SpO2 98 %.     Lymphatics: No palpable cervical, supraclavicular, axillary or inguinal lymph nodes. Resp: Lungs clear bilaterally. Cardio: Regular rate and rhythm. GI: Abdomen soft and nontender.  No hepatosplenomegaly.  No mass. Vascular: Trace edema lower leg bilaterally.  Chronic stasis change right lower leg. Port-A-Cath without erythema.  Lab Results:  Lab Results  Component Value Date   WBC 10.0 01/23/2023   HGB 12.0 (L) 01/23/2023   HCT 35.3 (L) 01/23/2023   MCV 94.4 01/23/2023   PLT 247 01/23/2023   NEUTROABS 4.8 01/23/2023    Imaging:  No results found.  Medications: I have reviewed the patient's current medications.  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  No  pancreatic ductal dilatation identified.  No definite signs of metastatic disease in the chest, abdomen or pelvis. Cycle 1 gemcitabine/Abraxane 03/01/2021 Cycle 2 gemcitabine/Abraxane 03/14/2021 Cycle 3 gemcitabine/Abraxane 03/28/2021 Cycle 4 gemcitabine/Abraxane 04/17/2021 Cycle 5 gemcitabine/Abraxane 05/01/2021, Zofran/Decadron added Cycle 6 gemcitabine/Abraxane 05/15/2021 CT pancreas protocol 05/27/2021-unchanged 8 mm exophytic low-attenuation lesion at the posterior body of the pancreas 07/01/2021-distal pancreatectomy/splenectomy, 0.8 cm poorly differentiated adenocarcinoma, treatment response score-2, largest single foci of remaining tumor 0.2 cm, negative resection margins, 0/6 lymph nodes,ypT1bypNo, PanIN-1b Cycle 7 gemcitabine/Abraxane 08/14/2021 Cycle 8 gemcitabine/Abraxane 09/04/2021 Cycle 9 gemcitabine/Abraxane 09/18/2021 Cycle 10 gemcitabine/Abraxane 10/01/2021 Cycle 11 gemcitabine/Abraxane 10/16/2021 Cycle 12 gemcitabine/Abraxane 10/29/2021, Abraxane held secondary to neuropathy CT abdomen/pelvis 02/06/2022-acute interstitial edematous pancreatitis with small 1.6 cm acute peripancreatic fluid collection within the superior aspect of the pancreatic head; interval postoperative changes of distal pancreatectomy and splenectomy; stable benign left adrenal adenoma Diabetes Coronary artery disease Prostate cancer 2013-treated with radiation at [facility] Hypertension Sleep apnea 7.  Coronary artery bypass surgery 2004 8.  Hospital admission 03/04/2021-syncope, A. Fib-started on Eliquis 9.  Mild thrombocytopenia following cycle 1 gemcitabine/Abraxane-resolved 10.  Anemia 11.  Admission 09/22/2021 with a right metatarsal ulcer and right leg cellulitis 12.  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  Renal insufficiency  Disposition: [name] remains in clinical remission from pancreas cancer.  We will follow-up on the CA 19-9 from today.  He would like to keep the Port-A-Cath  in place.  He will return for Port-A-Cath flush in 6 weeks.  We will see him in follow-up with a CA 19 in 3 months.    Ned Card ANP/GNP-BC   01/23/2023  2:12 PM

## 2023-01-23 NOTE — Progress Notes (Signed)
[name], [name] ([id number]) [id number] Page 1 of 10 Visit Report for 01/22/2023 Chief Complaint Document Details Patient Name: Date of Service: [name] [name] 01/22/2023 12:45 PM Medical Record Number: [id number] Patient Account Number: [id number] Date of Birth/Sex: Treating RN: [name] (77 y.o. M) Primary Care Provider: [name], [name] Other Clinician: Referring Provider: Treating Provider/Extender: [name]: 4 Information Obtained from: Patient Chief Complaint 12/22/2022; referral for hyperbaric oxygen therapy in the setting of radiation cystitis, 2/8; right fifth met head plantar foot wound Electronic Signature(s) Signed: 01/22/2023 3:04:17 PM By: [name] Shan DO Entered By: [name] on 01/22/2023 13:51:51 -------------------------------------------------------------------------------- Debridement Details Patient Name: Date of Service: [name], [name] [name] L. 01/22/2023 12:45 PM Medical Record Number: [id number] Patient Account Number: [id number] Date of Birth/Sex: Treating RN: [birth year][name] (77 y.o. [name] Primary Care Provider: [name], [name] Other Clinician: Referring Provider: Treating Provider/Extender: [name] [name] Weeks in [name]: 4 Debridement Performed for Assessment: Wound #1 Right Metatarsal head fifth Performed By: Physician [name] Shan, DO Debridement Type: Debridement Severity of Tissue Pre Debridement: Fat layer exposed Level of Consciousness (Pre-procedure): Awake and Alert Pre-procedure Verification/Time Out Yes - 13:00 Taken: Start Time: 13:01 Pain Control: Lidocaine 4% T opical Solution T Area Debrided (L x W): otal 2 (cm) x 2 (cm) = 4 (cm) Tissue and other material debrided: Viable, Non-Viable, Callus, Subcutaneous, Skin: Dermis , Skin: Epidermis Level: Skin/Subcutaneous Tissue Debridement Description: Excisional Instrument:  Curette Bleeding: Minimum Hemostasis Achieved: Pressure End Time: 13:04 Procedural Pain: 0 Post Procedural Pain: 0 Response to [name]: Procedure was tolerated well Level of Consciousness (Post- Awake and Alert procedure): Post Debridement Measurements of Total Wound Length: (cm) 0.2 Width: (cm) 0.2 Depth: (cm) 0.3 Volume: (cm) 0.009 Character of Wound/Ulcer Post Debridement: Improved Severity of Tissue Post Debridement: Fat layer exposed [name], [name] ([id number] Page 2 of 10 Post Procedure Diagnosis Same as Pre-procedure Electronic Signature(s) Signed: 01/22/2023 3:04:17 PM By: [name] Shan DO Signed: 01/22/2023 5:24:12 PM By: [name] Pilling RN, BSN Entered By: [name] on 01/22/2023 13:05:19 -------------------------------------------------------------------------------- HPI Details Patient Name: Date of Service: [name], [name] [name] L. 01/22/2023 12:45 PM Medical Record Number: [id number] Patient Account Number: [id number] Date of Birth/Sex: Treating RN: [birth year] (77 y.o. M) Primary Care Provider: [name], [name] Other Clinician: Referring Provider: Treating Provider/Extender: [name] [name] Weeks in [name]: 4 History of Present Illness HPI Description: 12/22/2022 [name] is [name] 77 year old male with [name] past medical history of insulin-dependent controlled type 2 diabetes with last hemoglobin A1c of 6.8 on 12/11/2019, primary cancer of body of pancreas, prostate cancer status post radiation, essential hypertension and CABG that presents to the clinic for discussion of HBO therapy in the setting of radiation cystitis. For his prostate cancer patient was started on [name] ADT on 09/17/2012, and radiation therapy to [name] dose of 78 GY completed on 01/21/2013. Lupron #8/8 completed on 06/23/2014. He has described urinary urgency and frequency over the past several years. He follows with urology last seen on  11/21/2022 and was referred to [place] for HBO therapy. During that visit he described symptoms of urinary urgency/frequency/weak stream along with gross hematuria. He had [name] cystoscopy on 03/04/2022 that showed evidence of radiation cystitis changes with some increased neovascularity along the posterior bladder and trigone with no suspicious area concerning for CIS or papillary lesions. He reports 2 episodes of gross hematuria over the past year. He currently denies signs of infection. 01/22/2023; patient presents for  [name] right foot wound. He states the wound started out as [name] callus then opened and has remained this way for [name] year. He has been using antibiotic ointment to the area. He is not aggressively offloading this. He currently denies signs of infection. He is currently in HBO therapy for radiation cystitis and has been tolerating this well. Electronic Signature(s) Signed: 01/22/2023 3:04:17 PM By: [name] Shan DO Entered By: [name] on 01/22/2023 14:04:35 -------------------------------------------------------------------------------- Physical Exam Details Patient Name: Date of Service: [name] 01/22/2023 12:45 PM Medical Record Number: [id number] Patient Account Number: [id number] Date of Birth/Sex: Treating RN: [birth year] (77 y.o. M) Primary Care Provider: [name], [name] Other Clinician: Referring Provider: Treating Provider/Extender: [name] [name] Weeks in [name]: 4 Constitutional respirations regular, non-labored and within target range for patient.. Cardiovascular 2+ dorsalis pedis/posterior tibialis pulses. Psychiatric pleasant and cooperative. Notes Right foot: Small open wound with surrounding callus. Post-debridement there is healthy granulation tissue present. [name] ([id number]) [id number] Page 3 of 10 Electronic Signature(s) Signed: 01/22/2023 3:04:17 PM By: [name] Shan DO Entered By:  [name] on 01/22/2023 13:55:44 -------------------------------------------------------------------------------- Physician Orders Details Patient Name: Date of Service: [name], [name] [name] L. 01/22/2023 12:45 PM Medical Record Number: [id number] Patient Account Number: [id number] Date of Birth/Sex: Treating RN: [birth year][name] (77 y.o. 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 - Dr. Heber Sheyenne 01/26/2023 900 ROOM 6 CAST Return [name] ppointment in 2 weeks. Margarita Grizzle to see- 01/28/2023 0915 [facility] CAST TCC CHANGE ONLY Return [name] Bathing/ Shower/ Hygiene May shower with protection but do not get wound dressing(s) wet. Protect dressing(s) with water repellant cover (for example, large plastic bag) or [name] cast cover and may then take shower. Off-Loading Open toe surgical shoe to: - peg assist in shoe. Other: Hyperbaric Oxygen Therapy Evaluate for HBO Therapy Indication: - Radiation Cystitis If appropriate for [name], begin HBOT per protocol: 2.5 ATA for 90 Minutes with 2 Five (5) Minute [name] Breaks ir Total Number of Treatments: - 40 One treatments per day (delivered Monday through Friday unless otherwise specified in Special Instructions below): Finger stick [name] Pre- and Post- HBOT [name]. Follow Hyperbaric Oxygen Glycemia Protocol [name] frin (Oxymetazoline HCL) 0.05% nasal spray - 1 spray in both nostrils daily as needed prior to HBO  [name] for difficulty clearing ears Wound [name] Wound #1 - Metatarsal head fifth Wound Laterality: Right Cleanser: Wound Cleanser 1 x Per Day/30 Days Discharge Instructions: Cleanse the wound with wound cleanser prior to applying [name] clean dressing using gauze sponges, not tissue or cotton balls. Peri-Wound Care: Skin Prep 1 x Per Day/30 Days Discharge Instructions: Use skin prep as directed Prim Dressing: MediHoney Gel, tube 1.5 (oz) 1 x Per Day/30 Days ary Discharge Instructions: Apply to wound bed as instructed Prim Dressing: Sorbalgon AG Dressing 2x2 (in/in) 1 x Per Day/30 Days ary Discharge Instructions: Apply to wound bed as instructed Secondary Dressing: Optifoam Non-Adhesive Dressing, 4x4 in 1 x Per Day/30 Days Discharge Instructions: foam donut Secondary Dressing: Woven Gauze Sponges 2x2 in 1 x Per  Day/30 Days Discharge Instructions: Apply over primary dressing as directed. [name], [name] ([id number]) [id number] Page 4 of 10 Secured With: Child psychotherapist, Sterile 2x75 (in/in) 1 x Per Day/30 Days Discharge Instructions: Secure with stretch gauze as directed. Patient Medications llergies: Keflex, lisinopril, terazosin [name] Notifications Medication Indication Start End for debridements 01/22/2023 lidocaine DOSE topical 4 % cream - cream topical once daily GLYCEMIA INTERVENTIONS PROTOCOL PRE-HBO GLYCEMIA INTERVENTIONS ACTION INTERVENTION Obtain pre-HBO capillary [name] (ensure 1 physician order is in chart). [name]. Notify HBO physician and await physician orders. 2 If result is 70 mg/dl or below: B. If the result meets the hospital definition of [name] critical result, follow hospital policy. [name]. Give patient an 8 ounce Glucerna Shake, an 8 ounce Ensure, or 8 ounces of [name] Glucerna/Ensure equivalent dietary supplement*. B. Wait 30 minutes. If result is 71 mg/dl to 130 mg/dl: C. Retest patients capillary [name]  glucose (CBG). D. If result greater than or equal to 110 mg/dl, proceed with HBO. If result less than 110 mg/dl, notify HBO physician and consider holding HBO. If result is 131 mg/dl to 249 mg/dl: [name]. Proceed with HBO. [name]. Notify HBO physician and await physician orders. B. It is recommended to hold HBO and do If result is 250 mg/dl or greater: [name]/urine ketone testing. C. If the result meets the hospital definition of [name] critical result, follow hospital policy. POST-HBO GLYCEMIA INTERVENTIONS ACTION INTERVENTION Obtain post HBO capillary [name] (ensure 1 physician order is in chart). [name]. Notify HBO physician and await physician orders. 2 If result is 70 mg/dl or below: B. If the result meets the hospital definition of [name] critical result, follow hospital policy. [name]. Give patient an 8 ounce Glucerna Shake, an 8 ounce Ensure, or 8 ounces of [name] Glucerna/Ensure equivalent dietary supplement*. B. Wait 15 minutes for symptoms of If result is 71 mg/dl to 100 mg/dl: hypoglycemia (i.e. nervousness, anxiety, sweating, chills, clamminess, irritability, confusion, tachycardia or dizziness). C. If patient asymptomatic, discharge patient. If patient symptomatic, repeat capillary [name] (CBG) and notify HBO physician. If result is 101 mg/dl to 249 mg/dl: [name]. Discharge patient. [name]. Notify HBO physician and await physician orders. B. It is recommended to do [name]/urine ketone If result is 250 mg/dl or greater: testing. C. If the result meets the hospital definition of [name] critical result, follow hospital policy. *Juice or candies are NOT equivalent products. If patient refuses the Glucerna or Ensure, please consult the hospital dietitian for an appropriate substitute. Electronic Signature(s) Signed: 01/22/2023 3:04:17 PM By: [name] Shan DO Entered By: [name] on 01/22/2023 13:55:53 [name], [name] ([id number] Page 5 of  10 -------------------------------------------------------------------------------- Problem List Details Patient Name: Date of Service: [name] 01/22/2023 12:45 PM Medical Record Number: [id number] Patient Account Number: [id number] Date of Birth/Sex: Treating RN: [birth year][name] (77 y.o. 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 12/22/2022 No Yes C61 Malignant neoplasm of prostate 12/22/2022 No Yes E11.621 Type 2 diabetes mellitus with foot ulcer 01/22/2023 No Yes L97.512 Non-pressure chronic ulcer of other part of right foot with fat layer exposed 01/22/2023 No Yes Inactive Problems Resolved Problems Electronic Signature(s) Signed: 01/22/2023 3:04:17 PM By: [name] Shan DO Entered By: [name] on 01/22/2023  13:51:21 -------------------------------------------------------------------------------- Progress Note Details Patient Name: Date of Service: [name] 01/22/2023 12:45 PM Medical Record Number: [id number] Patient Account Number: [id number] Date of Birth/Sex: Treating RN: [birth year][name] (77 y.o. M) Primary Care Provider: [name], [name] Other Clinician: Referring Provider: Treating Provider/Extender: Rudi Bunyard [name] Weeks in [name]: 4 Subjective Chief Complaint Information obtained from Patient 12/22/2022; referral for hyperbaric oxygen therapy in the setting of radiation cystitis, 2/8; right fifth met head plantar foot wound History of Present Illness (HPI) 12/22/2022 Mr. Nicoles Montesino is [name] 77 year old male with [name] past medical history of insulin-dependent controlled type 2  diabetes with last hemoglobin A1c of 6.8 on 12/11/2019, primary cancer of body of pancreas, prostate cancer status post radiation, essential hypertension and CABG that presents to the clinic for [facility] ([id number]) [id number] Page 6 of 10 discussion of HBO therapy in the setting of radiation cystitis. For his prostate cancer patient was started on [name] ADT on 09/17/2012, and radiation therapy to [name] dose of 78 GY completed on 01/21/2013. Lupron #8/8 completed on 06/23/2014. He has described urinary urgency and frequency over the past several years. He follows with urology last seen on 11/21/2022 and was referred to [place] for HBO therapy. During that visit he described symptoms of urinary urgency/frequency/weak stream along with gross hematuria. He had [name] cystoscopy on 03/04/2022 that showed evidence of radiation cystitis changes with some increased neovascularity along the posterior bladder and trigone with no suspicious area concerning for CIS or papillary lesions. He reports 2 episodes of gross hematuria over the past year. He currently denies signs of infection. 01/22/2023; patient presents for [name] right foot wound. He states the wound started out as [name] callus then opened and has remained this way for [name] year. He has been using antibiotic ointment to the area. He is not aggressively offloading this. He currently denies signs of infection. He is currently in HBO therapy for radiation cystitis and has been tolerating this well. Patient History Information obtained from Patient, Chart. Family History Unknown History. Social History Former smoker - quit in 1976, Marital Status - Married, Alcohol Use - Never, Drug Use - No History, Caffeine Use - Rarely. Medical History Hematologic/Lymphatic Patient has history of Anemia Respiratory Patient has history of Sleep Apnea - uses CPAP Cardiovascular Patient has history of Arrhythmia - [name] fibb, Coronary Artery Disease, Hypertension,  Myocardial Infarction Endocrine Patient has history of Type II Diabetes Musculoskeletal Patient has history of Osteoarthritis Neurologic Patient has history of Neuropathy Oncologic Patient has history of Received Chemotherapy - 2022- Pancreatic Ca with chemo, Received Radiation - 10-12 years ago for prostate Ca Psychiatric Denies history of Confinement Anxiety Hospitalization/Surgery History - EUS. - Biopsy. - CA bypass graft. - cholecystectomy. - tonsillectomy. - appendectomy. - triple bypass. Medical [name] Surgical History Notes nd Cardiovascular hyperlipidemia Gastrointestinal fatty liver; GERD Genitourinary radiation cystitis, CKD, [name] in urine Oncologic hx prostate ca Psychiatric PTSD Objective Constitutional respirations regular, non-labored and within target range for patient.. Vitals Time Taken: 12:46 PM, Height: 71 in, Weight: 252 lbs, BMI: 35.1. Cardiovascular 2+ dorsalis pedis/posterior tibialis pulses. Psychiatric pleasant and cooperative. General Notes: Right foot: Small open wound with surrounding callus. Post-debridement there is healthy granulation tissue present. Integumentary (Hair, Skin) Wound #1 status is Open. Original cause of wound was Gradually Appeared. The date acquired was: 01/22/2022. The wound is located on the Right Metatarsal head fifth. The wound measures 0.2cm length x 0.2cm width x 0.3cm depth; 0.031cm^2 area and 0.009cm^3 volume. There is  Fat Layer (Subcutaneous Tissue) exposed. There is no tunneling or undermining noted. There is [name] medium amount of serosanguineous drainage noted. The wound margin is distinct with the outline attached to the wound base. There is large (67-100%) red, pink, pale granulation within the wound bed. There is no necrotic tissue within the wound bed. The periwound skin appearance exhibited: Callus, Dry/Scaly. The periwound skin appearance did not exhibit: Crepitus, Excoriation, Induration, Rash, Scarring, Maceration,  Atrophie Blanche, Cyanosis, Ecchymosis, Hemosiderin Staining, Mottled, Pallor, Rubor, Erythema. [name], [name] ([id number]) [id number] Page 7 of 10 Assessment Active Problems ICD-10 Irradiation cystitis with hematuria Essential (primary) hypertension Type 2 diabetes mellitus with diabetic nephropathy Malignant neoplasm of prostate Type 2 diabetes mellitus with foot ulcer Non-pressure chronic ulcer of other part of right foot with fat layer exposed Patient presents for initial evaluation today for right foot wound Secondary to type 2 diabetes. I debrided nonviable tissue. I recommended aggressive offloading with [name] peg assist and [name] surgical shoe along with silver alginate and Medihoney for the dressing. He would do well with [name] total contact cast and plan is to do this next week. He is strong pedal pulses with an ABI of 1.3 on the right and [name] TBI of 0.69. He should have adequate [name] flow for wound healing. Procedures Wound #1 Pre-procedure diagnosis of Wound #1 is [name] Diabetic Wound/Ulcer of the Lower Extremity located on the Right Metatarsal head fifth .Severity of Tissue Pre Debridement is: Fat layer exposed. There was [name] Excisional Skin/Subcutaneous Tissue Debridement with [name] total area of 4 sq cm performed by [name] Shan, DO. With the following instrument(s): Curette to remove Viable and Non-Viable tissue/material. Material removed includes Callus, Subcutaneous Tissue, Skin: Dermis, and Skin: Epidermis after achieving pain control using Lidocaine 4% Topical Solution. [name] time out was conducted at 13:00, prior to the start of the procedure. [name] Minimum amount of bleeding was controlled with Pressure. The procedure was tolerated well with [name] pain level of 0 throughout and [name] pain level of 0 following the procedure. Post Debridement Measurements: 0.2cm length x 0.2cm width x 0.3cm depth; 0.009cm^3 volume. Character of Wound/Ulcer Post Debridement is improved. Severity  of Tissue Post Debridement is: Fat layer exposed. Post procedure Diagnosis Wound #1: Same as Pre-Procedure Plan Follow-up Appointments: Return Appointment in 1 week. - Dr. Heber Datto 01/26/2023 900 ROOM 6 CAST Return Appointment in 2 weeks. Margarita Grizzle to see- 01/28/2023 0915 [facility] CAST TCC CHANGE ONLY Bathing/ Shower/ Hygiene: May shower with protection but do not get wound dressing(s) wet. Protect dressing(s) with water repellant cover (for example, large plastic bag) or [name] cast cover and may then take shower. Off-Loading: Open toe surgical shoe to: - peg assist in shoe. Other: Hyperbaric Oxygen Therapy: Evaluate for HBO Therapy Indication: - Radiation Cystitis If appropriate for [name], begin HBOT per protocol: 2.5 ATA for 90 Minutes with 2 Five (5) Minute Air Breaks T Number of Treatments: - 40 otal One treatments per day (delivered Monday through Friday unless otherwise specified in Special Instructions below): Finger stick [name] Pre- and Post- HBOT [name]. Follow Hyperbaric Oxygen Glycemia Protocol Afrin (Oxymetazoline HCL) 0.05% nasal spray - 1 spray in both nostrils daily as needed prior to HBO [name] for difficulty clearing ears The following medication(s) was prescribed: lidocaine topical 4 % cream cream topical once daily for for debridements was prescribed at facility WOUND #1: - Metatarsal head fifth Wound Laterality: Right Cleanser: Wound Cleanser 1 x Per Day/30 Days Discharge Instructions: Cleanse the wound with wound cleanser  prior to applying [name] clean dressing using gauze sponges, not tissue or cotton balls. Peri-Wound Care: Skin Prep 1 x Per Day/30 Days Discharge Instructions: Use skin prep as directed Prim Dressing: MediHoney Gel, tube 1.5 (oz) 1 x Per Day/30 Days ary Discharge Instructions: Apply to wound bed as instructed Prim Dressing: Sorbalgon AG Dressing 2x2 (in/in) 1 x Per Day/30 Days ary Discharge Instructions: Apply to wound bed as  instructed Secondary Dressing: Optifoam Non-Adhesive Dressing, 4x4 in 1 x Per Day/30 Days Discharge Instructions: foam donut Secondary Dressing: Woven Gauze Sponges 2x2 in 1 x Per Day/30 Days Discharge Instructions: Apply over primary dressing as directed. Secured With: Child psychotherapist, Sterile 2x75 (in/in) 1 x Per Day/30 Days Discharge Instructions: Secure with stretch gauze as directed. 1. In office sharp debridement 2. Medihoney and silver alginate 3. Aggressive offloadingoopeg assist with surgical shoe 4. Plan for total contact cast next week Electronic Signature(s) [name], [name] ([id number]) [id number] Page 8 of 10 Signed: 01/22/2023 3:04:17 PM By: [name] Shan DO Entered By: [name] on 01/22/2023 14:05:44 -------------------------------------------------------------------------------- HxROS Details Patient Name: Date of Service: [place], [name] [name] L. 01/22/2023 12:45 PM Medical Record Number: [id number] Patient Account Number: [id number] Date of Birth/Sex: Treating RN: [name][birth year] (77 y.o. M) Primary Care Provider: [name], [name] Other Clinician: Referring Provider: Treating Provider/Extender: [name] [name] Weeks in [name]: 4 Information Obtained From Patient Chart Hematologic/Lymphatic Medical History: Positive for: Anemia Respiratory Medical History: Positive for: Sleep Apnea - uses CPAP Cardiovascular Medical History: Positive for: Arrhythmia - [name] fibb; Coronary Artery Disease; Hypertension; Myocardial Infarction Past Medical History Notes: hyperlipidemia Gastrointestinal Medical History: Past Medical History Notes: fatty liver; GERD Endocrine Medical History: Positive for: Type II Diabetes Time with diabetes: 10 years Treated with: Insulin [name] sugar tested every day: Yes Tested : TID Genitourinary Medical History: Past Medical History Notes: radiation cystitis, CKD, [name] in  urine Musculoskeletal Medical History: Positive for: Osteoarthritis Neurologic Medical History: Positive for: Neuropathy Oncologic Medical History: Positive for: Received Chemotherapy - 2022- Pancreatic Ca with chemo; Received Radiation - 10-12 years ago for prostate Ca Past Medical History Notes: hx prostate ca [name], [name] ([id number]) [phone].pdf Page 9 of 10 Psychiatric Medical History: Negative for: Confinement Anxiety Past Medical History Notes: PTSD Immunizations Pneumococcal Vaccine: Received Pneumococcal Vaccination: Yes Received Pneumococcal Vaccination On or After 60th Birthday: Yes Implantable Devices None Hospitalization / Surgery History Type of Hospitalization/Surgery EUS Biopsy CA bypass graft cholecystectomy tonsillectomy appendectomy triple bypass Family and Social History Unknown History: Yes; Former smoker - quit in 1976; Marital Status - Married; Alcohol Use: Never; Drug Use: No History; Caffeine Use: Rarely; Financial Concerns: No; Food, Clothing or Shelter Needs: No; Support System Lacking: No; Transportation Concerns: No Electronic Signature(s) Signed: 01/22/2023 3:04:17 PM By: [name] Shan DO Entered By: [name] on 01/22/2023 13:54:29 -------------------------------------------------------------------------------- [name] Details Patient Name: Date of Service: [name] [name] Dilday Priest L. 01/22/2023 Medical Record Number: [id number] Patient Account Number: [id number] Date of Birth/Sex: Treating RN: [birth year] (77 y.o. 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621 Type 2 diabetes mellitus with foot ulcer L97.512 Non-pressure chronic  ulcer of other part of right foot with fat layer exposed Quantity: .pdf Page 10 of 10 : [id number] - WC PHYS SUBQ TISS 20 SQ CM 1 ICD-10 Diagnosis Description 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

## 2023-01-24 LAB — CANCER ANTIGEN 19-9: CA 19-9: 24 U/mL (ref 0–35)

## 2023-01-26 ENCOUNTER — Encounter (HOSPITAL_BASED_OUTPATIENT_CLINIC_OR_DEPARTMENT_OTHER): Payer: Medicare Other | Admitting: Internal Medicine

## 2023-01-26 DIAGNOSIS — E1122 Type 2 diabetes mellitus with diabetic chronic kidney disease: Secondary | ICD-10-CM | POA: Diagnosis not present

## 2023-01-26 DIAGNOSIS — L97512 Non-pressure chronic ulcer of other part of right foot with fat layer exposed: Secondary | ICD-10-CM

## 2023-01-26 DIAGNOSIS — E1140 Type 2 diabetes mellitus with diabetic neuropathy, unspecified: Secondary | ICD-10-CM | POA: Diagnosis not present

## 2023-01-26 DIAGNOSIS — E785 Hyperlipidemia, unspecified: Secondary | ICD-10-CM | POA: Diagnosis not present

## 2023-01-26 DIAGNOSIS — I129 Hypertensive chronic kidney disease with stage 1 through stage 4 chronic kidney disease, or unspecified chronic kidney disease: Secondary | ICD-10-CM | POA: Diagnosis not present

## 2023-01-26 DIAGNOSIS — E11621 Type 2 diabetes mellitus with foot ulcer: Secondary | ICD-10-CM | POA: Diagnosis not present

## 2023-01-26 NOTE — Progress Notes (Signed)
GAYLARD, HITT ([id number]) [id number] Page 1 of 8 Visit Report for 01/26/2023 Arrival Information Details Patient Name: Date of Service: [name] 01/26/2023 2:00 PM Medical Record Number: [id number] Patient Account Number: [id number] Date of Birth/Sex: Treating RN: Mar 26, 1946 (77 y.o. M) Primary Care Rylynn Schoneman: [name], Bill Other Clinician: Referring Calyn Rubi: Treating Derell Bruun/Extender: [name], [name], [name]: 5 Visit Information History Since Last Visit Added or deleted any medications: No Patient Arrived: [name] Any new allergies or adverse reactions: No Arrival Time: 14:11 Had Bill fall or experienced change in No Accompanied By: self activities of daily living that may affect Transfer Assistance: None risk of falls: Patient Identification Verified: Yes Signs or symptoms of abuse/neglect since last visito No Secondary Verification Process Completed: Yes Hospitalized since last visit: No Patient Requires Transmission-Based No Implantable device outside of the clinic excluding No Precautions: cellular tissue based products placed in the center Patient Has Alerts: Yes since last visit: Patient Alerts: Patient on Blood Thinner Has Dressing in Place as Prescribed: Yes LTBI: 0.88 09/25/2021 Pain Present Now: No VVS Electronic Signature(s) Signed: 01/26/2023 4:13:33 PM By: Erenest Blank Entered By: Erenest Blank on 01/26/2023 14:12:40 -------------------------------------------------------------------------------- Encounter Discharge Information Details Patient Name: Date of Service: [name], [name] 01/26/2023 2:00 PM Medical Record Number: [id number] Patient Account Number: [id number] Date of Birth/Sex: Treating RN: November 25, 1946 (77 y.o. Mare Ferrari Primary Care Valmai Vandenberghe: [name], Bill Other Clinician: Referring Inigo Lantigua: Treating Bunny Kleist/Extender: [name], [name], Bill Weeks in  Treatment: 5 Encounter Discharge Information Items Post Procedure Vitals Discharge Condition: Stable Temperature (F): 97.9 Ambulatory Status: Ambulatory Pulse (bpm): 65 Discharge Destination: Home Respiratory Rate (breaths/min): 18 Transportation: Private Auto Blood Pressure (mmHg): 143/68 Accompanied By: self Schedule Follow-up Appointment: Yes Clinical Summary of Care: Patient Declined Electronic Signature(s) Signed: 01/26/2023 4:13:25 PM By: Sharyn Creamer RN, BSN Entered By: Sharyn Creamer on 01/26/2023 15:09:57 [name], [name] ([id number].pdf Page 2 of 8 -------------------------------------------------------------------------------- Lower Extremity Assessment Details Patient Name: Date of Service: [name]. 01/26/2023 2:00 PM Medical Record Number: [id number] Patient Account Number: [id number] Date of Birth/Sex: Treating RN: [birth year] (78 y.o. M) Primary Care Jadarrius Maselli: [name], Bill Other Clinician: Referring Ariana Juul: Treating Colman Birdwell/Extender: [name], [name], [name]: 5 Edema Assessment Assessed: [Left: No] [Right: No] [Left: Edema] [Right: :] Calf Left: Right: Point of Measurement: 38 cm From Medial Instep 45 cm Ankle Left: Right: Point of Measurement: 13 cm From Medial Instep 27.5 cm Electronic Signature(s) Signed: 01/26/2023 4:13:33 PM By: Erenest Blank Entered By: Erenest Blank on 01/26/2023 14:19:53 -------------------------------------------------------------------------------- Multi Wound Chart Details Patient Name: Date of Service: [name], [name] 01/26/2023 2:00 PM Medical Record Number: [id number] Patient Account Number: [id number] Date of Birth/Sex: Treating RN: August 07, 1946 (77 y.o. M) Primary Care Pari Lombard: [name], Bill Other Clinician: Referring Aariya Ferrick: Treating Velita Quirk/Extender: [name], [name], [name]: 5 Vital Signs Height(in):  71 Capillary Blood Glucose(mg/dl): 126 Weight(lbs): 252 Pulse(bpm): 65 Body Mass Index(BMI): 35.1 Blood Pressure(mmHg): 143/68 Temperature(F): 97.9 Respiratory Rate(breaths/min): 18 [1:Photos:] [[name]:[name]] Right Metatarsal head fifth [name] [name] Wound Location: Gradually Appeared [name] [name] Wounding Event: Diabetic Wound/Ulcer of the Lower [name] [name] Primary Etiology: Extremity Anemia, Sleep Apnea, Arrhythmia, [name] [name] Comorbid History: Coronary Artery Disease, Meixner, [name] ([id number].pdf Page 3 of 8 Hypertension, Myocardial Infarction, Type II Diabetes, Osteoarthritis, Neuropathy, Received Chemotherapy, Received Radiation 01/22/2022 [name] [name] Date Acquired: 0 [name] [name] Weeks of Treatment: Open [name] [name] Wound Status: No [name] [name] Wound  Recurrence: 0.1x0.1x0.1 [name] [name] Measurements [name] x W x D (cm) 0.008 [name] [name] Bill (cm) : rea 0.001 [name] [name] Volume (cm) : 74.20% [name] [name] % Reduction in Bill rea: 88.90% [name] [name] % Reduction in Volume: Grade 1 [name] [name] Classification: Medium [name] [name] Exudate Bill mount: Serosanguineous [name] [name] Exudate Type: red, brown [name] [name] Exudate Color: Distinct, outline attached [name] [name] Wound Margin: Large (67-100%) [name] [name] Granulation Bill mount: Red, Pink, Pale [name] [name] Granulation Quality: None Present (0%) [name] [name] Necrotic Bill mount: Fat Layer (Subcutaneous Tissue): Yes [name] [name] Exposed Structures: Fascia: No Tendon: No Muscle: No Joint: No Bone: No None [name] [name] Epithelialization: Debridement - Excisional [name] [name] Debridement: Pre-procedure Verification/Time Out 14:31 [name] [name] Taken: Lidocaine 4% Topical Solution [name] [name] Pain Control: Callus, Subcutaneous, Slough [name] [name] Tissue Debrided: Skin/Subcutaneous Tissue [name] [name] Level: 0.25 [name] [name] Debridement Bill (sq cm): rea Curette [name] [name] Instrument: Minimum [name] [name] Bleeding: Pressure [name] [name] Hemostasis Bill chieved: 0 [name] [name] Procedural Pain: 0 [name] [name] Post  Procedural Pain: Procedure was tolerated well [name] [name] Debridement Treatment Response: 0.2x0.2x0.2 [name] [name] Post Debridement Measurements [name] x W x D (cm) 0.006 [name] [name] Post Debridement Volume: (cm) Callus: Yes [name] [name] Periwound Skin Texture: Excoriation: No Induration: No Crepitus: No Rash: No Scarring: No Dry/Scaly: Yes [name] [name] Periwound Skin Moisture: Maceration: No Atrophie Blanche: No [name] [name] Periwound Skin Color: Cyanosis: No Ecchymosis: No Erythema: No Hemosiderin Staining: No Mottled: No Pallor: No Rubor: No Debridement [name] [name] Procedures Performed: Treatment Notes Electronic Signature(s) Signed: 01/26/2023 3:45:51 PM By: [name] DO Entered By: [name] on 01/26/2023 14:35:12 -------------------------------------------------------------------------------- Multi-Disciplinary Care Plan Details Patient Name: Date of Service: [facility], [name] 01/26/2023 2:00 PM Medical Record Number: [id number] Patient Account Number: [id number] Date of Birth/Sex: Treating RN: 1946/03/31 (77 y.o. Mare Ferrari Primary Care Damonie Furney: [name], Bill Other Clinician: Betti [name] ([id number]) [id number] Page 4 of 8 Referring Alila Sotero: Treating Liliani Bobo/Extender: [name], [name], [name]: 5 Active Inactive HBO Nursing Diagnoses: Anxiety related to feelings of confinement associated with the hyperbaric oxygen chamber Potential for barotraumas to ears, sinuses, teeth, and lungs or cerebral gas embolism related to changes in atmospheric pressure inside hyperbaric oxygen chamber Goals: Patient will tolerate the hyperbaric oxygen therapy treatment Date Initiated: 12/22/2022 Target Resolution Date: 02/13/2023 Goal Status: Active Patient/caregiver will verbalize understanding of HBO goals, rationale, procedures and potential hazards Date Initiated: 12/22/2022 Target Resolution Date: 02/13/2023 Goal Status:  Active Interventions: Assess and provide for patients comfort related to the hyperbaric environment and equalization of middle ear Assess patient for any history of confinement anxiety Notes: Orientation to the Wound Care Program Nursing Diagnoses: Knowledge deficit related to the wound healing center program Goals: Patient/caregiver will verbalize understanding of the [place] Date Initiated: 12/22/2022 Target Resolution Date: 02/13/2023 Goal Status: Active Interventions: Provide education on orientation to the wound center Notes: Pain, Acute or Chronic Nursing Diagnoses: Pain, acute or chronic: actual or potential Potential alteration in comfort, pain Goals: Patient will verbalize adequate pain control and receive pain control interventions during procedures as needed Date Initiated: 01/22/2023 Target Resolution Date: 02/18/2023 Goal Status: Active Interventions: Encourage patient to take pain medications as prescribed Provide education on pain management Treatment Activities: Administer pain control measures as ordered : 01/22/2023 Notes: Wound/Skin Impairment Nursing Diagnoses: Knowledge deficit related to ulceration/compromised skin integrity Goals: Patient/caregiver will verbalize understanding of skin care regimen Date Initiated: 01/22/2023 Target Resolution Date: 02/20/2023 Goal  Status: Active Interventions: Assess patient/caregiver ability to perform ulcer/skin care regimen upon admission and as needed Assess ulceration(s) every visit Provide education on ulcer and skin care [name], [name] ([id number]) [phone].pdf Page 5 of 8 Treatment Activities: Skin care regimen initiated : 01/22/2023 Topical wound management initiated : 01/22/2023 Notes: Electronic Signature(s) Signed: 01/26/2023 4:13:25 PM By: Sharyn Creamer RN, BSN Entered By: Sharyn Creamer on 01/26/2023  14:36:39 -------------------------------------------------------------------------------- Pain Assessment Details Patient Name: Date of Service: [name], [name] 01/26/2023 2:00 PM Medical Record Number: [id number] Patient Account Number: [id number] Date of Birth/Sex: Treating RN: 1946-11-10 (77 y.o. M) Primary Care Valora Norell: [name], Bill Other Clinician: Referring Ryane Konieczny: Treating Maud Rubendall/Extender: [name], [name], [name]: 5 Active Problems Location of Pain Severity and Description of Pain Patient Has Paino No Site Locations Pain Management and Medication Current Pain Management: Electronic Signature(s) Signed: 01/26/2023 4:13:33 PM By: Erenest Blank Entered By: Erenest Blank on 01/26/2023 14:14:31 -------------------------------------------------------------------------------- Patient/Caregiver Education Details Patient Name: Date of Service: [place] 2/12/2024andnbsp2:00 PM Medical Record Number: [id number] Patient Account Number: [id number] Date of Birth/Gender: Treating RN: 06-29-1946 (77 y.o. Mare Ferrari Primary Care Physician: [name], Bill Other Clinician: Referring Physician: Treating Physician/Extender: [name], [name], [name]: [address], [name] ([id number]) [id number] Page 6 of 8 Education Assessment Education Provided To: Patient Education Topics Provided Wound/Skin Impairment: Methods: Explain/Verbal Responses: State content correctly Electronic Signature(s) Signed: 01/26/2023 4:13:25 PM By: Sharyn Creamer RN, BSN Entered By: Sharyn Creamer on 01/26/2023 14:36:54 -------------------------------------------------------------------------------- Wound Assessment Details Patient Name: Date of Service: [name], [name] 01/26/2023 2:00 PM Medical Record Number: [id number] Patient Account Number: [id number] Date of Birth/Sex: Treating RN: [birth year]  (77 y.o. M) Primary Care Jamison Soward: [name], Bill Other Clinician: Referring Inesha Sow: Treating Shandell Giovanni/Extender: [name], [name], [name]: 5 Wound Status Wound Number: 1 Primary Diabetic Wound/Ulcer of the Lower Extremity Etiology: Wound Location: Right Metatarsal head fifth Wound Open Wounding Event: Gradually Appeared Status: Date Acquired: 01/22/2022 Comorbid Anemia, Sleep Apnea, Arrhythmia, Coronary Artery Disease, Weeks Of Treatment: 0 History: Hypertension, Myocardial Infarction, Type II Diabetes, Clustered Wound: No Osteoarthritis, Neuropathy, Received Chemotherapy, Received Radiation Photos Wound Measurements Length: (cm) 0.1 Width: (cm) 0.1 Depth: (cm) 0.1 Area: (cm) 0.008 Volume: (cm) 0.001 % Reduction in Area: 74.2% % Reduction in Volume: 88.9% Epithelialization: None Wound Description Classification: Grade 1 Wound Margin: Distinct, outline attached Exudate Amount: Medium Exudate Type: Serosanguineous Exudate Color: red, brown Foul Odor After Cleansing: No Slough/Fibrino No Wound Bed Granulation Amount: Large (67-100%) Exposed Structure [name], [name] ([id number].pdf Page 7 of 8 Granulation Quality: Red, Pink, Pale Fascia Exposed: No Necrotic Amount: None Present (0%) Fat Layer (Subcutaneous Tissue) Exposed: Yes Tendon Exposed: No Muscle Exposed: No Joint Exposed: No Bone Exposed: No Periwound Skin Texture Texture Color No Abnormalities Noted: No No Abnormalities Noted: No Callus: Yes Atrophie Blanche: No Crepitus: No Cyanosis: No Excoriation: No Ecchymosis: No Induration: No Erythema: No Rash: No Hemosiderin Staining: No Scarring: No Mottled: No Pallor: No Moisture Rubor: No No Abnormalities Noted: No Dry / Scaly: Yes Maceration: No Treatment Notes Wound #1 (Metatarsal head fifth) Wound Laterality: Right Cleanser Wound Cleanser Discharge Instruction: Cleanse the wound  with wound cleanser prior to applying Bill clean dressing using gauze sponges, not tissue or cotton balls. Peri-Wound Care Skin Prep Discharge Instruction: Use skin prep as directed Topical Gentamicin Discharge Instruction: As directed by physician Mupirocin Ointment Discharge Instruction: Apply Mupirocin (Bactroban) as instructed Primary Dressing Endoform  2x2 in Discharge Instruction: Moisten with saline Secondary Dressing Optifoam Non-Adhesive Dressing, 4x4 in Discharge Instruction: foam donut Woven Gauze Sponges 2x2 in Discharge Instruction: Apply over primary dressing as directed. Secured With Conforming Stretch Gauze Bandage, Sterile 2x75 (in/in) Discharge Instruction: Secure with stretch gauze as directed. Compression Wrap Compression Stockings Add-Ons Electronic Signature(s) Signed: 01/26/2023 4:13:33 PM By: Erenest Blank Entered By: Erenest Blank on 01/26/2023 14:18:49 -------------------------------------------------------------------------------- Vitals Details Patient Name: Date of Service: [name], [name] 01/26/2023 2:00 PM [name] ([id number].pdf Page 8 of 8 Medical Record Number: [id number] Patient Account Number: [id number] Date of Birth/Sex: Treating RN: [birth year] (77 y.o. M) Primary Care Lawanna Cecere: [name], Bill Other Clinician: Referring Iliyah Bui: Treating Meryle Pugmire/Extender: [name], [name], [name]: 5 Vital Signs Time Taken: 14:12 Temperature (F): 97.9 Height (in): 71 Pulse (bpm): 65 Weight (lbs): 252 Respiratory Rate (breaths/min): 18 Body Mass Index (BMI): 35.1 Blood Pressure (mmHg): 143/68 Capillary Blood Glucose (mg/dl): 126 Reference Range: 80 - 120 mg / dl Electronic Signature(s) Signed: 01/26/2023 4:13:33 PM By: Erenest Blank Entered By: Erenest Blank on 01/26/2023 14:14:12

## 2023-01-26 NOTE — Progress Notes (Addendum)
[name], [name] ([id number]) [id number] Page 1 of 10 Visit Report for 01/26/2023 Chief Complaint Document Details Patient Name: Date of Service: [name] 01/26/2023 2:00 PM Medical Record Number: [id number] Patient Account Number: [id number] Date of Birth/Sex: Treating RN: 1946/08/08 (77 y.o. M) Primary Care Provider: [name], [name] Other Clinician: Referring Provider: Treating Provider/Extender: Dmya Long [name] Weeks in Treatment: 5 Information Obtained from: Patient Chief Complaint 12/22/2022; referral for hyperbaric oxygen therapy in the setting of radiation cystitis, 2/8; right fifth met head plantar foot wound Electronic Signature(s) Signed: 01/26/2023 3:45:51 PM By: Bill Shan DO Entered By: [name] on 01/26/2023 14:35:20 -------------------------------------------------------------------------------- Debridement Details Patient Name: Date of Service: [name], [name]. 01/26/2023 2:00 PM Medical Record Number: [id number] Patient Account Number: [id number] Date of Birth/Sex: Treating RN: [birth year] (77 y.o. Mare Ferrari Primary Care Provider: [name], [name] Other Clinician: Referring Provider: Treating Provider/Extender: [name] [name] Weeks in Treatment: 5 Debridement Performed for Assessment: Wound #1 Right Metatarsal head fifth Performed By: Physician Bill Shan, DO Debridement Type: Debridement Severity of Tissue Pre Debridement: Fat layer exposed Level of Consciousness (Pre-procedure): Awake and Alert Pre-procedure Verification/Time Out Yes - 14:31 Taken: Start Time: 14:31 Pain Control: Lidocaine 4% T opical Solution T Area Debrided ([name] x W): otal 0.5 (cm) x 0.5 (cm) = 0.25 (cm) Tissue and other material debrided: Non-Viable, Callus, Slough, Subcutaneous, Slough Level: Skin/Subcutaneous Tissue Debridement Description: Excisional Instrument: Curette Bleeding:  Minimum Hemostasis Achieved: Pressure Procedural Pain: 0 Post Procedural Pain: 0 Response to Treatment: Procedure was tolerated well Level of Consciousness (Post- Awake and Alert procedure): Post Debridement Measurements of Total Wound Length: (cm) 0.2 Width: (cm) 0.2 Depth: (cm) 0.2 Volume: (cm) 0.006 Character of Wound/Ulcer Post Debridement: Improved Severity of Tissue Post Debridement: Fat layer exposed [name], [name] ([id number] Page 2 of 10 Post Procedure Diagnosis Same as Pre-procedure Notes Scribed for Dr Heber Allisonia by Bill Creamer, RN Electronic Signature(s) Signed: 01/26/2023 3:45:51 PM By: Bill Shan DO Signed: 01/26/2023 4:13:25 PM By: Bill Creamer RN, BSN Entered By: [name] on 01/26/2023 14:33:31 -------------------------------------------------------------------------------- HPI Details Patient Name: Date of Service: [name], [name]. 01/26/2023 2:00 PM Medical Record Number: [id number] Patient Account Number: [id number] Date of Birth/Sex: Treating RN: 1946-12-15 (77 y.o. M) Primary Care Provider: [name], [name] Other Clinician: Referring Provider: Treating Provider/Extender: Navy Belay [name] Weeks in Treatment: 5 History of Present Illness HPI Description: 12/22/2022 [name] is Bill 77 year old male with Bill past medical history of insulin-dependent controlled type 2 diabetes with last hemoglobin A1c of 6.8 on 12/11/2019, primary cancer of body of pancreas, prostate cancer status post radiation, essential hypertension and CABG that presents to the clinic for discussion of HBO therapy in the setting of radiation cystitis. For his prostate cancer patient was started on Bill ADT on 09/17/2012, and radiation therapy to Bill dose of 78 GY completed on 01/21/2013. Lupron #8/8 completed on 06/23/2014. He has described urinary urgency and frequency over the past several years. He follows with urology  last seen on 11/21/2022 and was referred to [place] for HBO therapy. During that visit he described symptoms of urinary urgency/frequency/weak stream along with gross hematuria. He had Bill cystoscopy on 03/04/2022 that showed evidence of radiation cystitis changes with some increased neovascularity along the posterior bladder and trigone with no suspicious area concerning for CIS or papillary lesions. He reports 2 episodes of gross hematuria over the past year. He currently denies signs of infection. 01/22/2023; patient  presents for Bill right foot wound. He states the wound started out as Bill callus then opened and has remained this way for Bill year. He has been using antibiotic ointment to the area. He is not aggressively offloading this. He currently denies signs of infection. He is currently in HBO therapy for radiation cystitis and has been tolerating this well. 2/12; patient presents for follow-up at the right foot wound. He is agreeable with the cast placement today. Electronic Signature(s) Signed: 01/26/2023 3:45:51 PM By: Bill Shan DO Entered By: [name] on 01/26/2023 14:35:41 -------------------------------------------------------------------------------- Physical Exam Details Patient Name: Date of Service: [name], [name]. 01/26/2023 2:00 PM Medical Record Number: [id number] Patient Account Number: [id number] Date of Birth/Sex: Treating RN: [birth year] (77 y.o. M) Primary Care Provider: [name], [name] Other Clinician: Referring Provider: Treating Provider/Extender: [name] [name] Weeks in Treatment: 5 Constitutional respirations regular, non-labored and within target range for patient.. Cardiovascular 2+ dorsalis pedis/posterior tibialis pulses. Psychiatric ELWAY, MCCULLUM ([id number]) [id number] Page 3 of 10 pleasant and cooperative. Notes Right foot: Small open wound with surrounding callus. Post-debridement there is healthy  granulation tissue present. Electronic Signature(s) Signed: 01/26/2023 3:45:51 PM By: Bill Shan DO Entered By: [name] on 01/26/2023 14:36:07 -------------------------------------------------------------------------------- Physician Orders Details Patient Name: Date of Service: [place], [name]. 01/26/2023 2:00 PM Medical Record Number: [id number] Patient Account Number: [id number] Date of Birth/Sex: Treating RN: May 03, 1946 (77 y.o. Mare Ferrari Primary Care Provider: [name], [name] Other Clinician: Referring Provider: Treating Provider/Extender: Johni Narine [name] Weeks in Treatment: 5 Verbal / Phone Orders: No Diagnosis Coding Follow-up Appointments ppointment in 1 week. - Dr. Heber  01/28/2023 0915 WEDNESDAY CAST TCC CHANGE ONLY Margarita Grizzle to see) Return Bill Return Appointment in 2 weeks. Bathing/ Shower/ Hygiene May shower with protection but do not get wound dressing(s) wet. Protect dressing(s) with water repellant cover (for example, large plastic bag) or Bill cast cover and may then take shower. Off-Loading Total Contact Cast to Right Lower Extremity Removable cast walker boot to: Hyperbaric Oxygen Therapy Evaluate for HBO Therapy Indication: - Radiation Cystitis If appropriate for treatment, begin HBOT per protocol: 2.5 ATA for 90 Minutes with 2 Five (5) Minute Bill Breaks ir Total Number of Treatments: - 40 One treatments per day (delivered Monday through Friday unless otherwise specified in Special Instructions below): Finger stick [name] Pre- and Post- HBOT Treatment. Follow Hyperbaric Oxygen Glycemia Protocol Bill frin (Oxymetazoline HCL) 0.05% nasal spray - 1 spray in both nostrils daily as needed prior to HBO treatment for difficulty clearing ears Wound Treatment Wound #1 - Metatarsal head fifth Wound Laterality: Right Cleanser: Wound Cleanser 1 x Per Day/30 Days Discharge Instructions: Cleanse the wound with wound cleanser prior to  applying Bill clean dressing using gauze sponges, not tissue or cotton balls. Peri-Wound Care: Skin Prep 1 x Per Day/30 Days Discharge Instructions: Use skin prep as directed Topical: Gentamicin 1 x Per Day/30 Days Discharge Instructions: As directed by physician Topical: Mupirocin Ointment 1 x Per Day/30 Days Discharge Instructions: Apply Mupirocin (Bactroban) as instructed Prim Dressing: Endoform 2x2 in 1 x Per Day/30 Days ary Discharge Instructions: Moisten with saline Secondary Dressing: Optifoam Non-Adhesive Dressing, 4x4 in 1 x Per Day/30 Days Discharge Instructions: foam donut Secondary Dressing: Woven Gauze Sponges 2x2 in 1 x Per Day/30 Days Discharge Instructions: Apply over primary dressing as directed. Secured With: Child psychotherapist, Sterile 2x75 (in/in) 1 x Per Day/30 Days Discharge Instructions: Secure with stretch gauze as directed.  FRANCES, BLEE ([id number]) [id number] Page 4 of 10 Patient Medications llergies: Keflex, lisinopril, terazosin Bill Notifications Medication Indication Start End prior to debridement 01/26/2023 lidocaine DOSE topical 4 % cream - cream topical once daily GLYCEMIA INTERVENTIONS PROTOCOL PRE-HBO GLYCEMIA INTERVENTIONS ACTION INTERVENTION Obtain pre-HBO capillary [name] (ensure 1 physician order is in chart). Bill. Notify HBO physician and await physician orders. 2 If result is 70 mg/dl or below: B. If the result meets the hospital definition of Bill critical result, follow hospital policy. Bill. Give patient an 8 ounce Glucerna Shake, an 8 ounce Ensure, or 8 ounces of Bill Glucerna/Ensure equivalent dietary supplement*. B. Wait 30 minutes. If result is 71 mg/dl to 130 mg/dl: C. Retest patients capillary [name] (CBG). D. If result greater than or equal to 110 mg/dl, proceed with HBO. If result less than 110 mg/dl, notify HBO physician and consider holding HBO. If result is 131 mg/dl to  249 mg/dl: Bill. Proceed with HBO. Bill. Notify HBO physician and await physician orders. B. It is recommended to hold HBO and do If result is 250 mg/dl or greater: [name]/urine ketone testing. C. If the result meets the hospital definition of Bill critical result, follow hospital policy. POST-HBO GLYCEMIA INTERVENTIONS ACTION INTERVENTION Obtain post HBO capillary [name] (ensure 1 physician order is in chart). Bill. Notify HBO physician and await physician orders. 2 If result is 70 mg/dl or below: B. If the result meets the hospital definition of Bill critical result, follow hospital policy. Bill. Give patient an 8 ounce Glucerna Shake, an 8 ounce Ensure, or 8 ounces of Bill Glucerna/Ensure equivalent dietary supplement*. B. Wait 15 minutes for symptoms of If result is 71 mg/dl to 100 mg/dl: hypoglycemia (i.e. nervousness, anxiety, sweating, chills, clamminess, irritability, confusion, tachycardia or dizziness). C. If patient asymptomatic, discharge patient. If patient symptomatic, repeat capillary [name] (CBG) and notify HBO physician. If result is 101 mg/dl to 249 mg/dl: Bill. Discharge patient. Bill. Notify HBO physician and await physician orders. B. It is recommended to do [name]/urine ketone If result is 250 mg/dl or greater: testing. C. If the result meets the hospital definition of Bill critical result, follow hospital policy. *Juice or candies are NOT equivalent products. If patient refuses the Glucerna or Ensure, please consult the hospital dietitian for an appropriate substitute. Electronic Signature(s) Signed: 01/26/2023 3:45:51 PM By: Bill Shan DO Signed: 01/26/2023 4:13:25 PM By: Bill Creamer RN, BSN Entered By: [name] on 01/26/2023 14:38:12 -------------------------------------------------------------------------------- Problem List Details Patient Name: Date of Service: [name], [name]. 01/26/2023 2:00 PM [name] ([id number].pdf Page 5 of 10 Medical Record Number: [id number] Patient Account Number: [id number] Date of Birth/Sex: Treating RN: 11-06-1946 (77 y.o. 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 01/26/2023 2:00 PM Medical Record Number:  [id number] Patient Account Number: [id number] Date of Birth/Sex: Treating RN: February 04, 1946 (77 y.o. M) Primary Care Provider: [name], [name] Other Clinician: Referring Provider: Treating Provider/Extender: Lakoda Mcanany [name] Weeks in Treatment: 5 Subjective Chief Complaint Information obtained from Patient 12/22/2022; referral for hyperbaric oxygen therapy in the setting of radiation cystitis, 2/8; right fifth met head plantar foot wound History of Present Illness (HPI) 12/22/2022 Mr. Fennec Diffenderfer is Bill 77 year old male with Bill past medical history of insulin-dependent controlled type 2 diabetes with last hemoglobin A1c of 6.8 on 12/11/2019, primary cancer of body of pancreas, prostate cancer status post radiation,  essential hypertension and CABG that presents to the clinic for discussion of HBO therapy in the setting of radiation cystitis. For his prostate cancer patient was started on Bill ADT on 09/17/2012, and radiation therapy to Bill dose of 78 GY completed on 01/21/2013. Lupron #8/8 completed on 06/23/2014. He has described urinary urgency and frequency over the past several years. He follows with urology last seen on 11/21/2022 and was referred to [place] for HBO therapy. During that visit he described symptoms of urinary urgency/frequency/weak stream along with gross hematuria. He had Bill cystoscopy on 03/04/2022 that showed evidence of radiation cystitis changes with some YARNELL, NEWBY [name] ([id number]) [phone].pdf Page 6 of 10 increased neovascularity along the posterior bladder and trigone with no suspicious area concerning for CIS or papillary lesions. He reports 2 episodes of gross hematuria over the past year. He currently denies signs of infection. 01/22/2023; patient presents for Bill right foot wound. He states the wound started out as Bill callus then opened and has remained this way for Bill year. He has been using antibiotic ointment to the area. He is not aggressively offloading this. He currently denies signs of infection. He is currently in HBO therapy for radiation cystitis and has been tolerating this well. 2/12; patient presents for follow-up at the right foot wound. He is agreeable with the cast placement today. Patient History Information obtained from Patient, Chart. Family History Unknown History. Social History Former smoker - quit in 1976, Marital Status - Married, Alcohol Use - Never, Drug Use - No History, Caffeine Use - Rarely. Medical History Hematologic/Lymphatic Patient has history of Anemia Respiratory Patient has history of Sleep Apnea - uses CPAP Cardiovascular Patient has history of Arrhythmia - Bill fibb, Coronary Artery Disease, Hypertension, Myocardial  Infarction Endocrine Patient has history of Type II Diabetes Musculoskeletal Patient has history of Osteoarthritis Neurologic Patient has history of Neuropathy Oncologic Patient has history of Received Chemotherapy - 2022- Pancreatic Ca with chemo, Received Radiation - 10-12 years ago for prostate Ca Psychiatric Denies history of Confinement Anxiety Hospitalization/Surgery History - EUS. - Biopsy. - CA bypass graft. - cholecystectomy. - tonsillectomy. - appendectomy. - triple bypass. Medical Bill Surgical History Notes nd Cardiovascular hyperlipidemia Gastrointestinal fatty liver; GERD Genitourinary radiation cystitis, CKD, [name] in urine Oncologic hx prostate ca Psychiatric PTSD Objective Constitutional respirations regular, non-labored and within target range for patient.. Vitals Time Taken: 2:12 PM, Height: 71 in, Weight: 252 lbs, BMI: 35.1, Temperature: 97.9 F, Pulse: 65 bpm, Respiratory Rate: 18 breaths/min, [name] Pressure: 143/68 mmHg, Capillary [name]: 126 mg/dl. Cardiovascular 2+ dorsalis pedis/posterior tibialis pulses. Psychiatric pleasant and cooperative. General Notes: Right foot: Small open wound with surrounding callus. Post-debridement there is healthy granulation tissue present. Integumentary (Hair, Skin) Wound #1 status is Open. Original cause of wound was Gradually Appeared. The date acquired was: 01/22/2022. The wound is located on the Right Metatarsal head fifth. The wound measures  0.1cm length x 0.1cm width x 0.1cm depth; 0.008cm^2 area and 0.001cm^3 volume. There is Fat Layer (Subcutaneous Tissue) exposed. There is Bill medium amount of serosanguineous drainage noted. The wound margin is distinct with the outline attached to the wound base. There is large (67-100%) red, pink, pale granulation within the wound bed. There is no necrotic tissue within the wound bed. The periwound skin appearance exhibited: Callus, Dry/Scaly. The periwound skin appearance did  not exhibit: Crepitus, Excoriation, Induration, Rash, Scarring, Maceration, Atrophie Blanche, Cyanosis, Ecchymosis, Hemosiderin Staining, Mottled, Pallor, Rubor, Erythema. Assessment [name], [name] ([id number]) [id number] Page 7 of 10 Active Problems ICD-10 Irradiation cystitis with hematuria Essential (primary) hypertension Type 2 diabetes mellitus with diabetic nephropathy Malignant neoplasm of prostate Type 2 diabetes mellitus with foot ulcer Non-pressure chronic ulcer of other part of right foot with fat layer exposed Patient's wound is stable. I debrided nonviable tissue. I recommended endoform with antibiotic ointment under the total contact cast. He will follow-up later this week for obligatory cast change. Procedures Wound #1 Pre-procedure diagnosis of Wound #1 is Bill Diabetic Wound/Ulcer of the Lower Extremity located on the Right Metatarsal head fifth .Severity of Tissue Pre Debridement is: Fat layer exposed. There was Bill Excisional Skin/Subcutaneous Tissue Debridement with Bill total area of 0.25 sq cm performed by Bill Shan, DO. With the following instrument(s): Curette to remove Non-Viable tissue/material. Material removed includes Callus, Subcutaneous Tissue, and Slough after achieving pain control using Lidocaine 4% Topical Solution. No specimens were taken. Bill time out was conducted at 14:31, prior to the start of the procedure. Bill Minimum amount of bleeding was controlled with Pressure. The procedure was tolerated well with Bill pain level of 0 throughout and Bill pain level of 0 following the procedure. Post Debridement Measurements: 0.2cm length x 0.2cm width x 0.2cm depth; 0.006cm^3 volume. Character of Wound/Ulcer Post Debridement is improved. Severity of Tissue Post Debridement is: Fat layer exposed. Post procedure Diagnosis Wound #1: Same as Pre-Procedure General Notes: Scribed for Dr Heber McCook by Bill Creamer, RN. Pre-procedure diagnosis of  Wound #1 is Bill Diabetic Wound/Ulcer of the Lower Extremity located on the Right Metatarsal head fifth . There was Bill T Interior and spatial designer Procedure by Bill Shan, DO. Post procedure Diagnosis Wound #1: Same as Pre-Procedure Plan Follow-up Appointments: Return Appointment in 1 week. - Dr. Heber Bonifay 01/28/2023 0915 WEDNESDAY CAST TCC CHANGE ONLY Margarita Grizzle to see) Return Appointment in 2 weeks. Bathing/ Shower/ Hygiene: May shower with protection but do not get wound dressing(s) wet. Protect dressing(s) with water repellant cover (for example, large plastic bag) or Bill cast cover and may then take shower. Off-Loading: T Contact Cast to Right Lower Extremity otal Removable cast walker boot to: Hyperbaric Oxygen Therapy: Evaluate for HBO Therapy Indication: - Radiation Cystitis If appropriate for treatment, begin HBOT per protocol: 2.5 ATA for 90 Minutes with 2 Five (5) Minute Air Breaks T Number of Treatments: - 40 otal One treatments per day (delivered Monday through Friday unless otherwise specified in Special Instructions below): Finger stick [name] Pre- and Post- HBOT Treatment. Follow Hyperbaric Oxygen Glycemia Protocol Afrin (Oxymetazoline HCL) 0.05% nasal spray - 1 spray in both nostrils daily as needed prior to HBO treatment for difficulty clearing ears The following medication(s) was prescribed: lidocaine topical 4 % cream cream topical once daily for prior to debridement starting 01/26/2023 WOUND #1: - Metatarsal head fifth Wound Laterality: Right Cleanser: Wound Cleanser 1 x Per Day/30 Days Discharge Instructions: Cleanse the wound with wound cleanser prior to  applying Bill clean dressing using gauze sponges, not tissue or cotton balls. Peri-Wound Care: Skin Prep 1 x Per Day/30 Days Discharge Instructions: Use skin prep as directed Topical: Gentamicin 1 x Per Day/30 Days Discharge Instructions: As directed by physician Topical: Mupirocin Ointment 1 x Per Day/30 Days Discharge  Instructions: Apply Mupirocin (Bactroban) as instructed Prim Dressing: Endoform 2x2 in 1 x Per Day/30 Days ary Discharge Instructions: Moisten with saline Secondary Dressing: Optifoam Non-Adhesive Dressing, 4x4 in 1 x Per Day/30 Days Discharge Instructions: foam donut Secondary Dressing: Woven Gauze Sponges 2x2 in 1 x Per Day/30 Days Discharge Instructions: Apply over primary dressing as directed. Secured With: Child psychotherapist, Sterile 2x75 (in/in) 1 x Per Day/30 Days Discharge Instructions: Secure with stretch gauze as directed. 1. In office sharp debridement 2. T contact cast placed in standard fashion otal 3. Endoform with antibiotic ointment 4. Follow-up in 2 days for obligatory cast change [name], [name] ([id number]) [phone].pdf Page 8 of 10 Electronic Signature(s) Signed: 02/01/2023 2:19:36 PM By: Deon Pilling RN, BSN Signed: 02/02/2023 4:03:53 PM By: Bill Shan DO Previous Signature: 01/26/2023 3:45:51 PM Version By: Bill Shan DO Entered By: Deon Pilling on 02/01/2023 14:18:31 -------------------------------------------------------------------------------- HxROS Details Patient Name: Date of Service: [place], [name]. 01/26/2023 2:00 PM Medical Record Number: [id number] Patient Account Number: [id number] Date of Birth/Sex: Treating RN: 14-Jun-1946 (77 y.o. M) Primary Care Provider: [name], [name] Other Clinician: Referring Provider: Treating Provider/Extender: [name] [name] Weeks in Treatment: 5 Information Obtained From Patient Chart Hematologic/Lymphatic Medical History: Positive for: Anemia Respiratory Medical History: Positive for: Sleep Apnea - uses CPAP Cardiovascular Medical History: Positive for: Arrhythmia - Bill fibb; Coronary Artery Disease; Hypertension; Myocardial Infarction Past Medical History Notes: hyperlipidemia Gastrointestinal Medical History: Past Medical History  Notes: fatty liver; GERD Endocrine Medical History: Positive for: Type II Diabetes Time with diabetes: 10 years Treated with: Insulin [name] sugar tested every day: Yes Tested : TID Genitourinary Medical History: Past Medical History Notes: radiation cystitis, CKD, [name] in urine Musculoskeletal Medical History: Positive for: Osteoarthritis Neurologic Medical History: Positive for: Neuropathy Oncologic Medical HistoryCARRON, NEE ([id number]) [id number] Page 9 of 10 Positive for: Received Chemotherapy - 2022- Pancreatic Ca with chemo; Received Radiation - 10-12 years ago for prostate Ca Past Medical History Notes: hx prostate ca Psychiatric Medical History: Negative for: Confinement Anxiety Past Medical History Notes: PTSD Immunizations Pneumococcal Vaccine: Received Pneumococcal Vaccination: Yes Received Pneumococcal Vaccination On or After 60th Birthday: Yes Implantable Devices None Hospitalization / Surgery History Type of Hospitalization/Surgery EUS Biopsy CA bypass graft cholecystectomy tonsillectomy appendectomy triple bypass Family and Social History Unknown History: Yes; Former smoker - quit in 1976; Marital Status - Married; Alcohol Use: Never; Drug Use: No History; Caffeine Use: Rarely; Financial Concerns: No; Food, Clothing or Shelter Needs: No; Support System Lacking: No; Transportation Concerns: No Electronic Signature(s) Signed: 01/26/2023 3:45:51 PM By: Bill Shan DO Entered By: [name] on 01/26/2023 14:35:47 -------------------------------------------------------------------------------- Total Contact Cast Details Patient Name: Date of Service: [name] [name]. 01/26/2023 2:00 PM Medical Record Number: [id number] Patient Account Number: [id number] Date of Birth/Sex: Treating RN: 1946/03/24 (77 y.o. Mare Ferrari Primary Care Provider: [name], [name] Other Clinician: Referring  Provider: Treating Provider/Extender: Landyn Lorincz [name] Weeks in Treatment: 5 T Contact Cast Applied for Wound Assessment: otal Wound #1 Right Metatarsal head fifth Performed By: Physician Bill Shan, DO Post Procedure Diagnosis Same as Pre-procedure Electronic Signature(s) Signed: 01/26/2023 3:45:51 PM By: Bill Shan DO Signed:  01/26/2023 4:13:25 PM By: Bill Creamer RN, BSN Entered By: [name] on 01/26/2023 14:39:10 [name] ([id number].pdf Page 10 of 10 -------------------------------------------------------------------------------- [name] Details Patient Name: Date of Service: [name] 01/26/2023 Medical Record Number: [id number] Patient Account Number: [id number] Date of Birth/Sex: Treating RN: 1946/09/29 (77 y.o. 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 Facility Procedures : CPT4 Code: [id number] Description: [place] - DEB SUBQ TISSUE 20 SQ CM/< ICD-10 Diagnosis Description 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

## 2023-01-27 ENCOUNTER — Encounter (HOSPITAL_BASED_OUTPATIENT_CLINIC_OR_DEPARTMENT_OTHER): Payer: Medicare Other | Admitting: Internal Medicine

## 2023-01-27 DIAGNOSIS — E1122 Type 2 diabetes mellitus with diabetic chronic kidney disease: Secondary | ICD-10-CM | POA: Diagnosis not present

## 2023-01-27 DIAGNOSIS — E1140 Type 2 diabetes mellitus with diabetic neuropathy, unspecified: Secondary | ICD-10-CM | POA: Diagnosis not present

## 2023-01-27 DIAGNOSIS — E785 Hyperlipidemia, unspecified: Secondary | ICD-10-CM | POA: Diagnosis not present

## 2023-01-27 DIAGNOSIS — I129 Hypertensive chronic kidney disease with stage 1 through stage 4 chronic kidney disease, or unspecified chronic kidney disease: Secondary | ICD-10-CM | POA: Diagnosis not present

## 2023-01-27 DIAGNOSIS — E11621 Type 2 diabetes mellitus with foot ulcer: Secondary | ICD-10-CM | POA: Diagnosis not present

## 2023-01-27 DIAGNOSIS — N3041 Irradiation cystitis with hematuria: Secondary | ICD-10-CM | POA: Diagnosis not present

## 2023-01-27 DIAGNOSIS — C61 Malignant neoplasm of prostate: Secondary | ICD-10-CM | POA: Diagnosis not present

## 2023-01-27 DIAGNOSIS — L97512 Non-pressure chronic ulcer of other part of right foot with fat layer exposed: Secondary | ICD-10-CM | POA: Diagnosis not present

## 2023-01-27 LAB — GLUCOSE, CAPILLARY
Glucose-Capillary: 239 mg/dL — ABNORMAL HIGH (ref 70–99)
Glucose-Capillary: 206 mg/dL — ABNORMAL HIGH (ref 70–99)

## 2023-01-27 NOTE — Progress Notes (Signed)
ARDON, DUGGAL ([id number]) [id number].pdf Page 1 of 2 Visit Report for 01/27/2023 Arrival Information Details Patient Name: Date of Service: [name] 01/27/2023 8:00 A M Medical Record Number: [id number] Patient Account Number: [id number] Date of Birth/Sex: Treating RN: 05/18/1946 (77 y.o. [name] Primary Care Jaliah Foody: [name], [name] Other Clinician: [name] Referring Kelia Gibbon: Treating Elner Seifert/Extender: [name], [name], [name] in Treatment: 5 Visit Information History Since Last Visit All ordered tests and consults were completed: Yes Patient Arrived: [name] Added or deleted any medications: No Arrival Time: 07:30 Any new allergies or adverse reactions: No Accompanied By: self Had a fall or experienced change in No Transfer Assistance: None activities of daily living that may affect Patient Identification Verified: Yes risk of falls: Secondary Verification Process Completed: Yes Signs or symptoms of abuse/neglect since last visito No Patient Requires Transmission-Based No Hospitalized since last visit: No Precautions: Implantable device outside of the clinic excluding No Patient Has Alerts: Yes cellular tissue based products placed in the center Patient Alerts: Patient on Blood Thinner since last visit: LTBI: 0.88 09/25/2021 Pain Present Now: No VVS Electronic Signature(s) Signed: 01/27/2023 1:39:30 PM By: [name] , , Entered By: [name] on 01/27/2023 13:39:30 -------------------------------------------------------------------------------- Encounter Discharge Information Details Patient Name: Date of Service: [name], [name] 01/27/2023 8:00 A M Medical Record Number: [id number] Patient Account Number: [id number] Date of Birth/Sex: Treating RN: [birth year] (77 y.o. [name] Primary Care Lucca Ballo: [name], [name] Other Clinician: [name] Referring Dotti Busey: Treating Jaycie Kregel/Extender: [name], [name], [name] in Treatment: 5 Encounter Discharge Information Items Discharge Condition: Stable Ambulatory Status: Cane Discharge Destination: Home Transportation: Private Auto Accompanied By: self Schedule Follow-up Appointment: No Clinical Summary of Care: Electronic Signature(s) Signed: 01/27/2023 2:06:13 PM By: [name] , , Entered By: [name] on 01/27/2023 [place], [name] ([id number].pdf Page 2 of 2 -------------------------------------------------------------------------------- Vitals Details Patient Name: Date of Service: [name] 01/27/2023 8:00 A M Medical Record Number: [id number] Patient Account Number: [id number] Date of Birth/Sex: Treating RN: 1946/01/29 (77 y.o. [name] Primary Care Lyriq Jarchow: [name], [name] Other Clinician: [name] Referring Lizett Chowning: Treating Kashay Cavenaugh/Extender: [name], [name], [name] in Treatment: 5 Vital Signs Time Taken: 07:40 Temperature (F): 99.1 Height (in): 71 Pulse (bpm): 86 Weight (lbs): 252 Respiratory Rate (breaths/min): 18 Body Mass Index (BMI): 35.1 Blood Pressure (mmHg): 144/73 Capillary Blood Glucose (mg/dl): 239 Reference Range: 80 - 120 mg / dl Electronic Signature(s) Signed: 01/27/2023 1:56:25 PM By: [name] , , Entered By: [name] on 01/27/2023 13:56:24

## 2023-01-27 NOTE — Progress Notes (Signed)
[name] ([id number].pdf Page 1 of 2 Visit Report for 01/27/2023 HBO Details Patient Name: Date of Service: [name]. 01/27/2023 8:00 A M Medical Record Number: [id number] Patient Account Number: [id number] Date of Birth/Sex: Treating RN: May 18, 1946 (77 y.o. [name] Primary Care Bertrum Helmstetter: [name] Other Clinician: Donavan [name] Referring [name]: Treating [name]/Extender: [name] [name] Weeks in Treatment: 5 HBO Treatment Course Details Treatment Course Number: 1 Ordering [name]: [name] T Treatments Ordered: otal 40 HBO Treatment Start Date: 01/05/2023 HBO Indication: Late Effect of Radiation HBO Treatment Details Treatment Number: 16 Patient Type: Outpatient Chamber Type: Monoplace Chamber Serial #: [id number] Treatment Protocol: 2.5 ATA with 90 minutes oxygen, with two 5 minute air breaks Treatment Details Compression Rate Down: 1.5 psi / minute De-Compression Rate Up: 2.0 psi / minute A breaks and breathing ir Compress Tx Pressure periods Decompress Decompress Begins Reached (leave unused spaces Begins Ends blank) Chamber Pressure (ATA 1 2.5 2.5 2.5 2.5 2.5 - - 2.5 1 ) Clock Time (24 hr) 08:13 08:30 09:00 09:05 09:35 09:40 - - 10:10 10:21 Treatment Length: 128 (minutes) Treatment Segments: 4 Vital Signs Capillary [name] Reference Range: 80 - 120 mg / dl HBO Diabetic [name] Intervention Range: <131 mg/dl or >249 mg/dl Type: Time Vitals [name] Respiratory Capillary [name] Pulse Action Pulse: Temperature: Taken: Pressure: Rate: Glucose (mg/dl): Meter #: Oximetry (%) Taken: Pre 07:40 144/73 86 18 99.1 239 1 none per protocol Post 10:25 166/82 79 18 98 206 1 none per protocol Treatment Response Treatment Toleration: Well Treatment Completion Status: Treatment Completed without Adverse Event Treatment Notes Patient arrived, prepared for treatment.  [name] level was 239 mg/dL and patient stated he ate breakfast. He self-administered Afrin. After performing safety check, patient placed in the chamber. Chamber compressed at 1 psi/min until reaching 4 psig and patient confirmed normal ear equalization. Set rate was then changed to 2 psi/min. Patient tolerated treatment and decompression of the chamber at 2 psi/min. Post treatment glucose level was 206 mg/dL. Patient was stable upon discharge. Physician HBO Attestation: I certify that I supervised this HBO treatment in accordance with Medicare guidelines. A trained emergency response team is readily available per Yes hospital policies and procedures. Continue HBOT as ordered. Yes Electronic Signature(s) Signed: 01/27/2023 3:51:25 PM By: Bill Shan DO Previous Signature: 01/27/2023 2:04:38 PM Version By: [name] CHT EMT BS , , Entered By: [name] on 01/27/2023 15:50:19 [name] ([id number].pdf Page 2 of 2 -------------------------------------------------------------------------------- HBO Safety Checklist Details Patient Name: Date of Service: [name]. 01/27/2023 8:00 A M Medical Record Number: [id number] Patient Account Number: [id number] Date of Birth/Sex: Treating RN: [birth year] (77 y.o. [name] Primary Care [name]: [name] Other Clinician: Donavan [name] Referring [name]: Treating [name]/Extender: [name] [name] Weeks in Treatment: 5 HBO Safety Checklist Items Safety Checklist Consent Form Signed Patient voided / foley secured and emptied When did you last eato 0600 Last dose of injectable or oral agent Yesterday Ostomy pouch emptied and vented if applicable NA All implantable devices assessed, documented and approved Bard PowerPort ClearVUE Intravenous access site secured and place NA Valuables secured Linens and cotton and cotton/polyester blend  (less than 51% polyester) Personal oil-based products / skin lotions / body lotions removed Wigs or hairpieces removed NA Smoking or tobacco materials removed NA Books / newspapers / magazines / loose paper removed Cologne, aftershave, perfume and deodorant removed Jewelry removed (may wrap  wedding band) Make-up removed NA Hair care products removed Battery operated devices (external) removed Heating patches and chemical warmers removed Titanium eyewear removed Nail polish cured greater than 10 hours NA Casting material cured greater than 10 hours Cast applied yesterday in clinic Hearing aids removed NA Loose dentures or partials removed NA Prosthetics have been removed NA Patient demonstrates correct use of air break device (if applicable) Patient concerns have been addressed Patient grounding bracelet on and cord attached to chamber Specifics for Inpatients (complete in addition to above) Medication sheet sent with patient NA Intravenous medications needed or due during therapy sent with patient NA Drainage tubes (e.g. nasogastric tube or chest tube secured and vented) NA Endotracheal or Tracheotomy tube secured NA Cuff deflated of air and inflated with saline NA Airway suctioned NA Notes Paper version used prior to treatment start. Electronic Signature(s) Signed: 01/27/2023 1:57:38 PM By: [name] CHT EMT BS , , Entered By: [name] on 01/27/2023 13:57:38

## 2023-01-28 ENCOUNTER — Encounter (HOSPITAL_BASED_OUTPATIENT_CLINIC_OR_DEPARTMENT_OTHER): Payer: Medicare Other | Admitting: Physician Assistant

## 2023-01-28 DIAGNOSIS — L97512 Non-pressure chronic ulcer of other part of right foot with fat layer exposed: Secondary | ICD-10-CM | POA: Diagnosis not present

## 2023-01-28 DIAGNOSIS — E11621 Type 2 diabetes mellitus with foot ulcer: Secondary | ICD-10-CM | POA: Diagnosis not present

## 2023-01-28 DIAGNOSIS — E1140 Type 2 diabetes mellitus with diabetic neuropathy, unspecified: Secondary | ICD-10-CM | POA: Diagnosis not present

## 2023-01-28 DIAGNOSIS — E1122 Type 2 diabetes mellitus with diabetic chronic kidney disease: Secondary | ICD-10-CM | POA: Diagnosis not present

## 2023-01-28 DIAGNOSIS — L598 Other specified disorders of the skin and subcutaneous tissue related to radiation: Secondary | ICD-10-CM | POA: Diagnosis not present

## 2023-01-28 DIAGNOSIS — I129 Hypertensive chronic kidney disease with stage 1 through stage 4 chronic kidney disease, or unspecified chronic kidney disease: Secondary | ICD-10-CM | POA: Diagnosis not present

## 2023-01-28 DIAGNOSIS — E785 Hyperlipidemia, unspecified: Secondary | ICD-10-CM | POA: Diagnosis not present

## 2023-01-28 LAB — GLUCOSE, CAPILLARY
Glucose-Capillary: 209 mg/dL — ABNORMAL HIGH (ref 70–99)
Glucose-Capillary: 208 mg/dL — ABNORMAL HIGH (ref 70–99)

## 2023-01-28 NOTE — Progress Notes (Signed)
[name], [name] ([id number]) [id number].pdf Page 1 of 1 Visit Report for 01/27/2023 [name] Details Patient Name: Date of Service: [name]. 01/27/2023 Medical Record Number: [id number] Patient Account Number: [id number] Date of Birth/Sex: Treating RN: July 09, 1946 (77 y.o. 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 foot with fat layer exposed Facility Procedures CPT4 Code Description Modifier Quantity [id number]-(Facility Use Only) HBOT full body chamber, 22mn , 4 ICD-10 Diagnosis Description 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

## 2023-01-29 ENCOUNTER — Encounter (HOSPITAL_BASED_OUTPATIENT_CLINIC_OR_DEPARTMENT_OTHER): Payer: Medicare Other | Admitting: Internal Medicine

## 2023-01-29 DIAGNOSIS — E11621 Type 2 diabetes mellitus with foot ulcer: Secondary | ICD-10-CM | POA: Diagnosis not present

## 2023-01-29 DIAGNOSIS — N3041 Irradiation cystitis with hematuria: Secondary | ICD-10-CM

## 2023-01-29 DIAGNOSIS — E1122 Type 2 diabetes mellitus with diabetic chronic kidney disease: Secondary | ICD-10-CM | POA: Diagnosis not present

## 2023-01-29 DIAGNOSIS — C61 Malignant neoplasm of prostate: Secondary | ICD-10-CM

## 2023-01-29 DIAGNOSIS — E1140 Type 2 diabetes mellitus with diabetic neuropathy, unspecified: Secondary | ICD-10-CM | POA: Diagnosis not present

## 2023-01-29 DIAGNOSIS — I129 Hypertensive chronic kidney disease with stage 1 through stage 4 chronic kidney disease, or unspecified chronic kidney disease: Secondary | ICD-10-CM | POA: Diagnosis not present

## 2023-01-29 DIAGNOSIS — L97512 Non-pressure chronic ulcer of other part of right foot with fat layer exposed: Secondary | ICD-10-CM | POA: Diagnosis not present

## 2023-01-29 DIAGNOSIS — E785 Hyperlipidemia, unspecified: Secondary | ICD-10-CM | POA: Diagnosis not present

## 2023-01-29 LAB — GLUCOSE, CAPILLARY
Glucose-Capillary: 173 mg/dL — ABNORMAL HIGH (ref 70–99)
Glucose-Capillary: 150 mg/dL — ABNORMAL HIGH (ref 70–99)

## 2023-01-29 NOTE — Progress Notes (Signed)
[name], Bill ([id number]) [phone].pdf Page 1 of 6 Visit Report for 01/28/2023 Arrival Information Details Patient Name: Date of Service: [name] 01/28/2023 10:15 Bill M Medical Record Number: [id number] Patient Account Number: [id number] Date of Birth/Sex: Treating RN: [birth year] (77 y.o. [name] Primary Care [name]: [name], [name] Other Clinician: Referring [name]: Treating [name]/Extender: [name] [name], [name] Weeks in Treatment: 5 Visit Information History Since Last Visit Added or deleted any medications: No Patient Arrived: [name] Any new allergies or adverse reactions: No Arrival Time: 10:30 Had Bill fall or experienced change in No Accompanied By: self activities of daily living that may affect Transfer Assistance: None risk of falls: Patient Identification Verified: Yes Signs or symptoms of abuse/neglect since last visito No Secondary Verification Process Completed: Yes Hospitalized since last visit: No Patient Requires Transmission-Based No Implantable device outside of the clinic excluding No Precautions: cellular tissue based products placed in the center Patient Has Alerts: Yes since last visit: Patient Alerts: Patient on [name] Has Dressing in Place as Prescribed: Yes LTBI: 0.88 09/25/2021 Has Footwear/Offloading in Place as Prescribed: Yes VVS Right: T Contact Cast otal Pain Present Now: No Electronic Signature(s) Signed: 01/28/2023 3:48:00 PM By: Sharyn Creamer RN, BSN Entered By: Sharyn Creamer on 01/28/2023 14:38:25 -------------------------------------------------------------------------------- Encounter Discharge Information Details Patient Name: Date of Service: [name], [name] 01/28/2023 10:15 Bill M Medical Record Number: [id number] Patient Account Number: [id number] Date of Birth/Sex: Treating RN: 1946/02/19 (77 y.o. [name] Primary Care [name], [name] Other  Clinician: Referring [name]: Treating [name]/Extender: [name] [name], [name] Weeks in Treatment: 5 Encounter Discharge Information Items Discharge Condition: Stable Ambulatory Status: Ambulatory Discharge Destination: Home Transportation: Private Auto Accompanied By: self Schedule Follow-up Appointment: Yes Clinical Summary of Care: Patient Declined Electronic Signature(s) Signed: 01/28/2023 3:48:00 PM By: Sharyn Creamer RN, BSN Entered By: Sharyn Creamer on 01/28/2023 14:44:19 [name] ([id number].pdf Page 2 of 6 -------------------------------------------------------------------------------- Lower Extremity Assessment Details Patient Name: Date of Service: [name] 01/28/2023 10:15 Bill M Medical Record Number: [id number] Patient Account Number: [id number] Date of Birth/Sex: Treating RN: 1946/03/22 (77 y.o. [name] Primary Care [name]: [name], [name] Other Clinician: Referring [name]: Treating [name]/Extender: [name] [name], [name] Weeks in Treatment: 5 Edema Assessment Assessed: [Left: No] [Right: No] [Left: Edema] [Right: :] Calf Left: Right: Point of Measurement: 38 cm From Medial Instep Ankle Left: Right: Point of Measurement: 13 cm From Medial Instep Vascular Assessment Pulses: Dorsalis Pedis Palpable: [Right:Yes] Electronic Signature(s) Signed: 01/28/2023 3:48:00 PM By: Sharyn Creamer RN, BSN Entered By: Sharyn Creamer on 01/28/2023 14:38:59 -------------------------------------------------------------------------------- [place] Details Patient Name: Date of Service: [name], [name] 01/28/2023 10:15 Bill M Medical Record Number: [id number] Patient Account Number: [id number] Date of Birth/Sex: Treating RN: [birth year] (77 y.o. [name] Primary Care [name]: [name], [name] Other Clinician: Referring Kritika Stukes: Treating [name]/Extender: Bill  III, [name] [name], [name] Weeks in Treatment: 5 Active Inactive HBO Nursing Diagnoses: Anxiety related to feelings of confinement associated with the hyperbaric oxygen chamber Potential for barotraumas to ears, sinuses, teeth, and lungs or cerebral gas embolism related to changes in atmospheric pressure inside hyperbaric oxygen chamber Goals: Patient will tolerate the hyperbaric oxygen therapy treatment Date Initiated: 12/22/2022 Target Resolution Date: 02/13/2023 Goal Status: Active Patient/caregiver will verbalize understanding of HBO goals, rationale, procedures and potential hazards Date Initiated: 12/22/2022 Target Resolution Date: 02/13/2023 Goal Status: Active [name], [name] ([id number]) [phone].pdf  Page 3 of 6 Interventions: Assess and provide for patients comfort related to the hyperbaric environment and equalization of middle ear Assess patient for any history of confinement anxiety Notes: Pain, Acute or Chronic Nursing Diagnoses: Pain, acute or chronic: actual or potential Potential alteration in comfort, pain Goals: Patient will verbalize adequate pain control and receive pain control interventions during procedures as needed Date Initiated: 01/22/2023 Target Resolution Date: 02/18/2023 Goal Status: Active Interventions: Encourage patient to take pain medications as prescribed Provide education on pain management Treatment Activities: Administer pain control measures as ordered : 01/22/2023 Notes: Wound/Skin Impairment Nursing Diagnoses: Knowledge deficit related to ulceration/compromised skin integrity Goals: Patient/caregiver will verbalize understanding of skin care regimen Date Initiated: 01/22/2023 Target Resolution Date: 02/20/2023 Goal Status: Active Interventions: Assess patient/caregiver ability to perform ulcer/skin care regimen upon admission and as needed Assess ulceration(s) every visit Provide education on ulcer and skin  care Treatment Activities: Skin care regimen initiated : 01/22/2023 Topical wound management initiated : 01/22/2023 Notes: Electronic Signature(s) Signed: 01/28/2023 3:48:00 PM By: Sharyn Creamer RN, BSN Entered By: Sharyn Creamer on 01/28/2023 14:40:58 -------------------------------------------------------------------------------- Pain Assessment Details Patient Name: Date of Service: [name], [name] 01/28/2023 10:15 Bill M Medical Record Number: [id number] Patient Account Number: [id number] Date of Birth/Sex: Treating RN: 24-May-1946 (77 y.o. [name] Primary Care Kody Brandl: [name], [name] Other Clinician: Referring Alfio Loescher: Treating Larz Mark/Extender: [name] [name], [name] Weeks in Treatment: 5 Active Problems Location of Pain Severity and Description of Pain Patient Has Paino No Site Locations [name], Bill L ([id number]) [phone].pdf Page 4 of 6 Pain Management and Medication Current Pain Management: Electronic Signature(s) Signed: 01/28/2023 3:48:00 PM By: Sharyn Creamer RN, BSN Entered By: Sharyn Creamer on 01/28/2023 14:38:40 -------------------------------------------------------------------------------- Patient/Caregiver Education Details Patient Name: Date of Service: [name] 2/14/2024andnbsp10:15 [name] Record Number: [id number] Patient Account Number: [id number] Date of Birth/Gender: Treating RN: [birth year] (77 y.o. [name] Primary Care Physician: [name], [name] Other Clinician: Referring Physician: Treating Physician/Extender: [name] [name], [name] Weeks in Treatment: 5 Education Assessment Education Provided To: Patient Education Topics Provided Offloading: Methods: Explain/Verbal Responses: State content correctly Wound/Skin Impairment: Methods: Explain/Verbal Responses: State content correctly Electronic Signature(s) Signed: 01/28/2023 3:48:00 PM By: Sharyn Creamer RN,  BSN Entered By: Sharyn Creamer on 01/28/2023 14:41:47 -------------------------------------------------------------------------------- Wound Assessment Details Patient Name: Date of Service: [name], [name] 01/28/2023 10:15 Bill [name], [name] ([id number].pdf Page 5 of 6 Medical Record Number: [id number] Patient Account Number: [id number] Date of Birth/Sex: Treating RN: [birth year] (77 y.o. [name] Primary Care Scarlett Portlock: [name], [name] Other Clinician: Referring Eduardo Honor: Treating Keil Pickering/Extender: [name] [name], [name] Weeks in Treatment: 5 Wound Status Wound Number: 1 Primary Diabetic Wound/Ulcer of the Lower Extremity Etiology: Wound Location: Right Metatarsal head fifth Wound Open Wounding Event: Gradually Appeared Status: Date Acquired: 01/22/2022 Comorbid Anemia, Sleep Apnea, Arrhythmia, Coronary Artery Disease, Weeks Of Treatment: 0 History: Hypertension, Myocardial Infarction, Type II Diabetes, Clustered Wound: No Osteoarthritis, Neuropathy, Received Chemotherapy, Received Radiation Wound Measurements Length: (cm) 0.1 Width: (cm) 0.1 Depth: (cm) 0.1 Area: (cm) 0.008 Volume: (cm) 0.001 % Reduction in Area: 74.2% % Reduction in Volume: 88.9% Epithelialization: None Tunneling: No Undermining: No Wound Description Classification: Grade 1 Wound Margin: Distinct, outline attached Exudate Amount: Medium Exudate Type: Serosanguineous Exudate Color: [name], brown Foul Odor After Cleansing: No Slough/Fibrino No Wound Bed Granulation Amount: Large (67-100%) Exposed Structure Granulation Quality: [name], Pink, Pale Fascia Exposed: No Necrotic Amount: None Present (  0%) Fat Layer (Subcutaneous Tissue) Exposed: Yes Tendon Exposed: No Muscle Exposed: No Joint Exposed: No Bone Exposed: No Periwound Skin Texture Texture Color No Abnormalities Noted: No No Abnormalities Noted: No Callus: Yes Atrophie Blanche:  No Crepitus: No Cyanosis: No Excoriation: No Ecchymosis: No Induration: No Erythema: No Rash: No Hemosiderin Staining: No Scarring: No Mottled: No Pallor: No Moisture Rubor: No No Abnormalities Noted: No Dry / Scaly: Yes Maceration: No Treatment Notes Wound #1 (Metatarsal head fifth) Wound Laterality: Right Cleanser Wound Cleanser Discharge Instruction: Cleanse the wound with wound cleanser prior to applying Bill clean dressing using gauze sponges, not tissue or cotton balls. Peri-Wound Care Skin Prep Discharge Instruction: Use skin prep as directed Topical Gentamicin Discharge Instruction: As directed by physician Mupirocin Ointment Discharge Instruction: Apply Mupirocin (Bactroban) as instructed Primary Dressing Endoform 2x2 in [facility] [place] ([id number]) [phone].pdf Page 6 of 6 Discharge Instruction: Moisten with saline Secondary Dressing Optifoam Non-Adhesive Dressing, 4x4 in Discharge Instruction: foam donut Woven Gauze Sponges 2x2 in Discharge Instruction: Apply over primary dressing as directed. Secured With Conforming Stretch Gauze Bandage, Sterile 2x75 (in/in) Discharge Instruction: Secure with stretch gauze as directed. Compression Wrap Compression Stockings Add-Ons Electronic Signature(s) Signed: 01/28/2023 3:48:00 PM By: Sharyn Creamer RN, BSN Entered By: Sharyn Creamer on 01/28/2023 14:39:37

## 2023-01-29 NOTE — Progress Notes (Signed)
AUSTINN, VANWIEREN ([id number].pdf Page 1 of 1 Visit Report for 01/28/2023 Arrival Information Details Patient Name: Date of Service: [name]. 01/28/2023 8:00 [name] Medical Record Number: [id number] Patient Account Number: [id number] Date of Birth/Sex: Treating RN: Apr 30, 1946 (77 y.o. [name] Primary Care Adalena Abdulla: [name], [name] Other Clinician: [name] Referring Gaston Dase: Treating Anet Logsdon/Extender: [name], [name] [name], [name] in Treatment: 5 Visit Information History Since Last Visit All ordered tests and consults were completed: Yes Patient Arrived: [name] Added or deleted any medications: No Arrival Time: 07:30 Any new allergies or adverse reactions: No Accompanied By: self Had Bill fall or experienced change in No Transfer Assistance: None activities of daily living that may affect Patient Identification Verified: Yes risk of falls: Secondary Verification Process Completed: Yes Signs or symptoms of abuse/neglect since last visito No Patient Requires Transmission-Based No Hospitalized since last visit: No Precautions: Implantable device outside of the clinic excluding No Patient Has Alerts: Yes cellular tissue based products placed in the center Patient Alerts: Patient on [name] since last visit: LTBI: 0.88 09/25/2021 Pain Present Now: No VVS Electronic Signature(s) Signed: 01/28/2023 2:01:45 PM By: [name] , , Entered By: [name] on 01/28/2023 14:01:45 -------------------------------------------------------------------------------- Vitals Details Patient Name: Date of Service: [name], Bill Bill L. 01/28/2023 8:00 [name] Medical Record Number: [id number] Patient Account Number: [id number] Date of Birth/Sex: Treating RN: 1946/04/02 (77 y.o. [name] Primary Care Stachia Slutsky: [name], [name] Other Clinician: [name] Referring Kayia Billinger: Treating  Saraya Tirey/Extender: [name], [name] [name], [name] in Treatment: 5 Vital Signs Time Taken: 07:50 Temperature (F): 98.0 Height (in): 71 Pulse (bpm): 84 Weight (lbs): 252 Respiratory Rate (breaths/min): 18 Body Mass Index (BMI): 35.1 [name]: 140/76 Capillary [name]: 209 Reference Range: 80 - 120 mg / dl Electronic Signature(s) Signed: 01/28/2023 2:03:27 PM By: [name] , , Entered By: [name] on 01/28/2023 14:03:27

## 2023-01-29 NOTE — Progress Notes (Addendum)
[name], [name] ([id number].pdf Page 1 of 2 Visit Report for 01/28/2023 HBO Details Patient Name: Date of Service: [name] 01/28/2023 8:00 A M Medical Record Number: [id number] Patient Account Number: [id number] Date of Birth/Sex: Treating RN: 05-10-1946 (77 y.o. [name] Primary Care Renise Gillies: [name], [name] Other Clinician: [name] Referring Moishe Schellenberg: Treating Ganesh Deeg/Extender: [name], [name] [name], [name] in Treatment: 5 HBO Treatment Course Details Treatment Course Number: 1 Ordering Idell Hissong: [name] T Treatments Ordered: otal 40 HBO Treatment Start Date: 01/05/2023 HBO Indication: Late Effect of Radiation HBO Treatment Details Treatment Number: 17 Patient Type: Outpatient Chamber Type: Monoplace Chamber Serial #: [id number] Treatment Protocol: 2.5 ATA with 90 minutes oxygen, with two 5 minute air breaks Treatment Details Compression Rate Down: 1.5 psi / minute De-Compression Rate Up: 2.0 psi / minute A breaks and breathing ir Compress Tx Pressure periods Decompress Decompress Begins Reached (leave unused spaces Begins Ends blank) Chamber Pressure (ATA 1 2.5 2.5 2.5 2.5 2.5 - - 2.5 1 ) Clock Time (24 hr) 07:57 08:14 08:44 08:49 09:19 09:24 - - 09:54 10:05 Treatment Length: 128 (minutes) Treatment Segments: 4 Vital Signs Capillary Blood Glucose Reference Range: 80 - 120 mg / dl HBO Diabetic Blood Glucose Intervention Range: <131 mg/dl or >249 mg/dl Type: Time Vitals Blood Respiratory Capillary Blood Glucose Pulse Action Pulse: Temperature: Taken: Pressure: Rate: Glucose (mg/dl): Meter #: Oximetry (%) Taken: Pre 07:50 140/76 84 18 98 209 1 none per protocol Post 10:12 155/72 87 18 98.4 208 1 none per protocol Treatment Response Treatment Toleration: Well Treatment Completion Status: Treatment Completed without Adverse Event Treatment Notes Patient arrived, prepped for treatment.  Blood glucose was 209 mg/dL. Patient went through safety check and then was placed in the chamber. Chamber compressed at 1 psi/min until patient confirmed proper ear equalization and then rate set was increased to 2 psi/min. Patient tolerated treatment and decompression of the chamber at 2 psi/min. Post treatment blood glucose was 208 mg/dL. Patient was stable upon discharge. Electronic Signature(s) Signed: 01/28/2023 2:15:12 PM By: [name] , , Signed: 01/28/2023 5:47:19 PM By: Worthy Keeler PA-C Previous Signature: 01/28/2023 2:10:33 PM Version By: [name] , , Previous Signature: 01/28/2023 2:10:13 PM Version By: [name] , , Entered By: [name] on 01/28/2023 14:15:12 HBO Safety Checklist Details -------------------------------------------------------------------------------- [name] ([id number].pdf Page 2 of 2 Patient Name: Date of Service: [place], [name] 01/28/2023 8:00 A M Medical Record Number: [id number] Patient Account Number: [id number] Date of Birth/Sex: Treating RN: Sep 24, 1946 (77 y.o. [name] Primary Care Mozell Hardacre: [name], [name] Other Clinician: [name] Referring Cherry Wittwer: Treating Shiane Wenberg/Extender: [name], [name] [name], [name] in Treatment: 5 HBO Safety Checklist Items Safety Checklist Consent Form Signed Patient voided / foley secured and emptied When did you last eato 0630 Last dose of injectable or oral agent 0645 Ostomy pouch emptied and vented if applicable NA All implantable devices assessed, documented and approved Bard PowerPort ClearVUE Intravenous access site secured and place NA Valuables secured Linens and cotton and cotton/polyester blend (less than 51% polyester) Personal oil-based products / skin lotions / body lotions removed Wigs or hairpieces removed NA Smoking or tobacco materials removed NA Books /  newspapers / magazines / loose paper removed Cologne, aftershave, perfume and deodorant removed Jewelry removed (may wrap wedding band) Make-up removed NA Hair care products removed Battery operated devices (external) removed Heating patches  and chemical warmers removed Titanium eyewear removed Nail polish cured greater than 10 hours NA Casting material cured greater than 10 hours Hearing aids removed NA Loose dentures or partials removed NA Prosthetics have been removed NA Patient demonstrates correct use of air break device (if applicable) Patient concerns have been addressed Patient grounding bracelet on and cord attached to chamber Specifics for Inpatients (complete in addition to above) Medication sheet sent with patient NA Intravenous medications needed or due during therapy sent with patient NA Drainage tubes (e.g. nasogastric tube or chest tube secured and vented) NA Endotracheal or Tracheotomy tube secured NA Cuff deflated of air and inflated with saline NA Airway suctioned NA Notes Paper version used prior to treatment start. Electronic Signature(s) Signed: 01/28/2023 2:04:28 PM By: [name] , , Entered By: [name] on 01/28/2023 14:04:28

## 2023-01-29 NOTE — Progress Notes (Addendum)
EDY, KRANER ([id number]) [id number] Page 1 of 7 Visit Report for 01/28/2023 Chief Complaint Document Details Patient Name: Date of Service: [name] 01/28/2023 10:15 Bill M Medical Record Number: [id number] Patient Account Number: [id number] Date of Birth/Sex: Treating RN: [birth year] (77 y.o. 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 01/28/2023 10:15 Bill M Medical Record Number: [id number] Patient Account Number: [id number] Date of Birth/Sex: Treating RN: November 27, 1946 (77 y.o. M) Primary Care Provider: [name], [name] Other Clinician: Referring Provider: Treating Provider/Extender: [name] [name] Weeks in Treatment: 5 History of Present Illness HPI Description: 12/22/2022 Mr. 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 For his prostate cancer patient was started on Bill ADT on 09/17/2012, and radiation therapy to Bill dose of 78 GY completed on 01/21/2013. Lupron #8/8 completed on 06/23/2014. He has described urinary urgency and frequency over the past several  years. He follows with urology last seen on 11/21/2022 and was referred to [place] for HBO therapy. During that visit he described symptoms of urinary urgency/frequency/weak stream along with gross hematuria. He had Bill cystoscopy on 03/04/2022 that showed evidence of radiation cystitis changes with some increased neovascularity along the posterior bladder and trigone with no suspicious area concerning for CIS or papillary lesions. He reports 2 episodes of gross hematuria over the past year. He currently denies signs of infection. 01/22/2023; patient presents for Bill right foot wound. He states the wound started out as Bill callus then opened and has remained this way for Bill year. He has been using antibiotic ointment to the area. He is not aggressively offloading this. He currently denies signs of infection. He is currently in HBO therapy for radiation cystitis and has been tolerating this well. 2/12; patient presents for follow-up at the right foot wound. He is agreeable with the cast placement today. 01-28-2023 upon evaluation today patient appears today for reevaluation here in the clinic. This is Bill wound currently on his foot which is being managed by Dr. Heber . With that being said it appears to be doing decently well today he had his first cast placed on Monday today he is here for his obligatory first cast change. Good news is nothing appears to be rubbing or causing any detriment anywhere on the leg at this point. Electronic Signature(s) Signed: 01/28/2023 11:43:21 AM By: Bill Keeler PA-C Entered By: [name] on 01/28/2023 11:43:21 [name] ([id number] Page 2 of 7 -------------------------------------------------------------------------------- Physical Exam Details Patient Name: Date of Service: [name] 01/28/2023 10:15 Bill M Medical Record Number: [id number] Patient Account Number: [id number] Date of Birth/Sex: Treating  RN: 02/01/1946 (77 y.o. M) Primary Care Provider: [name], [name] Other Clinician: Referring Provider: Treating Provider/Extender: [name] [name] Weeks in Treatment: 5 Constitutional Well-nourished  and well-hydrated in no acute distress. Respiratory normal breathing without difficulty. Psychiatric this patient is able to make decisions and demonstrates good insight into disease process. Alert and Oriented x 3. pleasant and cooperative. Notes Upon inspection patient's wound bed actually showed signs of again doing quite well I feel like the casting should do well for him and I am very pleased in that regard. I did go ahead and reapply the total contact cast today. Will see how things appear at follow-up when he sees Dr. Heber [address] next week. Electronic Signature(s) Signed: 01/28/2023 11:43:43 AM By: Bill Keeler PA-C Entered By: [name] on 01/28/2023 11:43:43 -------------------------------------------------------------------------------- Physician Orders Details Patient Name: Date of Service: [facility], [name] 01/28/2023 10:15 Bill M Medical Record Number: [id number] Patient Account Number: [id number] Date of Birth/Sex: Treating RN: [birth year] (77 y.o. 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 Bathing/ Shower/ Hygiene May shower with protection but do not get wound dressing(s) wet. Protect dressing(s) with water  repellant cover (for example, large plastic bag) or Bill cast cover and may then take shower. Off-Loading Total Contact Cast to Right Lower Extremity Removable cast walker boot to: Hyperbaric Oxygen Therapy DONDRELL, BAYLES ([id number]) [id number].pdf Page 3 of 7 Evaluate for HBO Therapy Indication: - Radiation Cystitis If appropriate for treatment, begin HBOT per protocol: 2.5 ATA for 90 Minutes with 2 Five (5) Minute Bill Breaks ir Total Number of Treatments: - 40 One treatments per day (delivered Monday through Friday unless otherwise specified in Special Instructions below): Finger stick [name] Pre- and Post- HBOT Treatment. Follow Hyperbaric Oxygen Glycemia Protocol Bill frin (Oxymetazoline HCL) 0.05% nasal spray - 1 spray in both nostrils daily as needed prior to HBO treatment for difficulty clearing ears Wound Treatment Wound #1 - Metatarsal head fifth Wound Laterality: Right Cleanser: Wound Cleanser 1 x Per Day/30 Days Discharge Instructions: Cleanse the wound with wound cleanser prior to applying Bill clean dressing using gauze sponges, not tissue or cotton balls. Peri-Wound Care: Skin Prep 1 x Per Day/30 Days Discharge Instructions: Use skin prep as directed Topical: Gentamicin 1 x Per Day/30 Days Discharge Instructions: As directed by physician Topical: Mupirocin Ointment 1 x Per Day/30 Days Discharge Instructions: Apply Mupirocin (Bactroban) as instructed Prim Dressing: Endoform 2x2 in 1 x Per Day/30 Days ary Discharge Instructions: Moisten with saline Secondary Dressing: Optifoam Non-Adhesive Dressing, 4x4 in 1 x Per Day/30 Days Discharge Instructions: foam donut Secondary Dressing: Woven Gauze Sponges 2x2 in 1 x Per Day/30 Days Discharge Instructions: Apply over primary dressing as directed. Secured With: Child psychotherapist, Sterile 2x75 (in/in) 1 x Per Day/30 Days Discharge Instructions: Secure with stretch gauze as  directed. GLYCEMIA INTERVENTIONS PROTOCOL PRE-HBO GLYCEMIA INTERVENTIONS ACTION INTERVENTION Obtain pre-HBO capillary [name] (ensure 1 physician order is in chart). Bill. Notify HBO physician and await physician orders. 2 If result is 70 mg/dl or below: B. If the result meets the hospital definition of Bill critical result, follow hospital policy. Bill. Give patient an 8 ounce Glucerna Shake, an 8 ounce Ensure, or  8 ounces of Bill Glucerna/Ensure equivalent dietary supplement*. B. Wait 30 minutes. If result is 71 mg/dl to 130 mg/dl: C. Retest patients capillary [name] (CBG). D. If result greater than or equal to 110 mg/dl, proceed with HBO. If result less than 110 mg/dl, notify HBO physician and consider holding HBO. If result is 131 mg/dl to 249 mg/dl: Bill. Proceed with HBO. Bill. Notify HBO physician and await physician orders. B. It is recommended to hold HBO and do If result is 250 mg/dl or greater: [name]/urine ketone testing. C. If the result meets the hospital definition of Bill critical result, follow hospital policy. POST-HBO GLYCEMIA INTERVENTIONS ACTION INTERVENTION Obtain post HBO capillary [name] (ensure 1 physician order is in chart). Bill. Notify HBO physician and await physician orders. 2 If result is 70 mg/dl or below: B. If the result meets the hospital definition of Bill critical result, follow hospital policy. Bill. Give patient an 8 ounce Glucerna Shake, an 8 ounce Ensure, or 8 ounces of Bill Glucerna/Ensure equivalent dietary supplement*. B. Wait 15 minutes for symptoms of If result is 71 mg/dl to 100 mg/dl: hypoglycemia (i.e. nervousness, anxiety, sweating, chills, clamminess, irritability, confusion, tachycardia or dizziness). C. If patient asymptomatic, discharge patient. If patient symptomatic, repeat capillary [name] (CBG) and notify HBO physician. If result is 101 mg/dl to 249 mg/dl: Bill. Discharge patient. AMRAM, PLYMIRE ([id number])  [id number] Page 4 of 7 Bill. Notify HBO physician and await physician orders. B. It is recommended to do [name]/urine ketone If result is 250 mg/dl or greater: testing. C. If the result meets the hospital definition of Bill critical result, follow hospital policy. *Juice or candies are NOT equivalent products. If patient refuses the Glucerna or Ensure, please consult the hospital dietitian for an appropriate substitute. Electronic Signature(s) Signed: 01/28/2023 3:48:00 PM By: Sharyn Creamer RN, BSN Signed: 01/28/2023 5:47:19 PM By: Bill Keeler PA-C Previous Signature: 01/28/2023 11:45:08 AM Version By: Bill Keeler PA-C Entered By: Sharyn Creamer on 01/28/2023 14:40:27 -------------------------------------------------------------------------------- Problem List Details Patient Name: Date of Service: [name], [name] 01/28/2023 10:15 Bill M Medical Record Number: [id number] Patient Account Number: [id number] Date of Birth/Sex: Treating RN: 24-Mar-1946 (77 y.o. 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 12/22/2022 No Yes C61 Malignant neoplasm of prostate 12/22/2022 No Yes E11.621 Type 2 diabetes mellitus with foot ulcer 01/22/2023 No Yes L97.512 Non-pressure chronic ulcer of other part of right foot with fat layer exposed 01/22/2023 No Yes Inactive Problems Resolved Problems Electronic Signature(s) Signed: 01/28/2023 10:12:16 AM By: Bill Keeler PA-C Entered By: [name] on 01/28/2023 10:12:16 [name] ([id number] Page 5 of  7 -------------------------------------------------------------------------------- Progress Note Details Patient Name: Date of Service: [name] 01/28/2023 10:15 Bill M Medical Record Number: [id number] Patient Account Number: [id number] Date of Birth/Sex: Treating RN: October 06, 1946 (77 y.o. 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 For his prostate cancer patient was started on Bill ADT on 09/17/2012, and radiation therapy to Bill dose of 78 GY completed on 01/21/2013. Lupron #8/8 completed on 06/23/2014. He has described urinary urgency and frequency over the past several years. He follows with urology last seen on 11/21/2022 and was referred to [place] for HBO therapy. During that visit he described symptoms of urinary urgency/frequency/weak stream along with gross hematuria. He had Bill cystoscopy on 03/04/2022 that showed evidence of radiation cystitis changes with some increased neovascularity along the posterior bladder and trigone with no suspicious area concerning for CIS or papillary lesions. He reports 2 episodes of gross hematuria over the past year. He currently denies signs of infection. 01/22/2023; patient presents for Bill right foot wound. He states the wound started out as Bill callus then  opened and has remained this way for Bill year. He has been using antibiotic ointment to the area. He is not aggressively offloading this. He currently denies signs of infection. He is currently in HBO therapy for radiation cystitis and has been tolerating this well. 2/12; patient presents for follow-up at the right foot wound. He is agreeable with the cast placement today. 01-28-2023 upon evaluation today patient appears today for reevaluation here in the clinic. This is Bill wound currently on his foot which is being managed by Dr. Heber Wink. With that being said it appears to be doing decently well today he had his first cast placed on Monday today he is here for his obligatory first cast change. Good news is nothing appears to be rubbing or causing any detriment anywhere on the leg at this point. Objective Constitutional Well-nourished and well-hydrated in no acute distress. Respiratory normal breathing without difficulty. Psychiatric this patient is able to make decisions and demonstrates good insight into disease process. Alert and Oriented x 3. pleasant and cooperative. General Notes: Upon inspection patient's wound bed actually showed signs of again doing quite well I feel like the casting should do well for him and I am very pleased in that regard. I did go ahead and reapply the total contact cast today. Will see how things appear at follow-up when he sees Dr. Heber [place] next week. Integumentary (Hair, Skin) Wound #1 status is Open. Original cause of wound was Gradually Appeared. The date acquired was: 01/22/2022. The wound is located on the Right Metatarsal head fifth. The wound measures 0.1cm length x 0.1cm width x 0.1cm depth; 0.008cm^2 area and 0.001cm^3 volume. There is Fat Layer (Subcutaneous Tissue) exposed. There is no tunneling or undermining noted. There is Bill medium amount of serosanguineous drainage noted. The wound margin is distinct with the outline attached to the wound base. There is  large (67-100%) red, pink, pale granulation within the wound bed. There is no necrotic tissue within the wound bed. The periwound skin appearance exhibited: Callus, Dry/Scaly. The periwound skin appearance did not exhibit: Crepitus, Excoriation, Induration, Rash, Scarring, Maceration, Atrophie Blanche, Cyanosis, Ecchymosis, Hemosiderin Staining, Mottled, Pallor, Rubor, Erythema. Assessment Active Problems ICD-10 Irradiation cystitis with hematuria Essential (primary) hypertension Type 2 diabetes mellitus with diabetic nephropathy [name], [name] ([id number]) [phone].pdf Page 6 of 7 Malignant neoplasm of prostate Type 2 diabetes mellitus with foot ulcer Non-pressure chronic ulcer  of other part of right foot with fat layer exposed Procedures Wound #1 Pre-procedure diagnosis of Wound #1 is Bill Diabetic Wound/Ulcer of the Lower Extremity located on the Right Metatarsal head fifth . There was Bill T Contact otal Cast Procedure by Bill Keeler, PA. Post procedure Diagnosis Wound #1: Same as Pre-Procedure Plan Follow-up Appointments: Return Appointment in 1 week. Bathing/ Shower/ Hygiene: May shower with protection but do not get wound dressing(s) wet. Protect dressing(s) with water repellant cover (for example, large plastic bag) or Bill cast cover and may then take shower. Off-Loading: T Contact Cast to Right Lower Extremity otal Removable cast walker boot to: Hyperbaric Oxygen Therapy: Evaluate for HBO Therapy Indication: - Radiation Cystitis If appropriate for treatment, begin HBOT per protocol: 2.5 ATA for 90 Minutes with 2 Five (5) Minute Air Breaks T Number of Treatments: - 40 otal One treatments per day (delivered Monday through Friday unless otherwise specified in Special Instructions below): Finger stick [name] Pre- and Post- HBOT Treatment. Follow Hyperbaric Oxygen Glycemia Protocol Afrin (Oxymetazoline HCL) 0.05% nasal spray - 1 spray in  both nostrils daily as needed prior to HBO treatment for difficulty clearing ears WOUND #1: - Metatarsal head fifth Wound Laterality: Right Cleanser: Wound Cleanser 1 x Per Day/30 Days Discharge Instructions: Cleanse the wound with wound cleanser prior to applying Bill clean dressing using gauze sponges, not tissue or cotton balls. Peri-Wound Care: Skin Prep 1 x Per Day/30 Days Discharge Instructions: Use skin prep as directed Topical: Gentamicin 1 x Per Day/30 Days Discharge Instructions: As directed by physician Topical: Mupirocin Ointment 1 x Per Day/30 Days Discharge Instructions: Apply Mupirocin (Bactroban) as instructed Prim Dressing: Endoform 2x2 in 1 x Per Day/30 Days ary Discharge Instructions: Moisten with saline Secondary Dressing: Optifoam Non-Adhesive Dressing, 4x4 in 1 x Per Day/30 Days Discharge Instructions: foam donut Secondary Dressing: Woven Gauze Sponges 2x2 in 1 x Per Day/30 Days Discharge Instructions: Apply over primary dressing as directed. Secured With: Child psychotherapist, Sterile 2x75 (in/in) 1 x Per Day/30 Days Discharge Instructions: Secure with stretch gauze as directed. 1. I am going to suggest that we have the patient continue to monitor for any signs of infection or worsening. Based on what I am seeing I do believe that he is really doing quite well. I am hopeful the cast will continue to benefit him. 2. I am going to recommend as well that the patient continue with the total contact casting along with the endoform and gentamicin which seems to be doing well. We will see patient back for reevaluation in 1 week here in the clinic. If anything worsens or changes patient will contact our office for additional recommendations. Electronic Signature(s) Signed: 02/03/2023 8:33:17 AM By: Bill Keeler PA-C Signed: 02/03/2023 4:50:49 PM By: Deon Pilling RN, BSN Previous Signature: 01/28/2023 11:46:25 AM Version By: Bill Keeler PA-C Entered By:  Deon Pilling on 02/03/2023 08:01:41 [name] ([id number] Page 7 of 7 -------------------------------------------------------------------------------- Total Contact Cast Details Patient Name: Date of Service: [name] 01/28/2023 10:15 Bill M Medical Record Number: [id number] Patient Account Number: [id number] Date of Birth/Sex: Treating RN: December 28, 1945 (77 y.o. Mare Ferrari Primary Care Provider: [name], [name] Other Clinician: Referring Provider: Treating Provider/Extender: Bill III, Ronette Hank [name] Weeks in Treatment: 5 T Contact Cast Applied for Wound Assessment: otal Wound #1 Right Metatarsal head fifth Performed By: Physician Bill Keeler, PA Post Procedure Diagnosis Same as Pre-procedure Electronic Signature(s) Signed: 01/28/2023  3:48:00 PM By: Sharyn Creamer RN, BSN Signed: 01/28/2023 5:47:19 PM By: Bill Keeler PA-C Entered By: Sharyn Creamer on 01/28/2023 14:41:17 -------------------------------------------------------------------------------- [place] Details Patient Name: Date of Service: [name], [name] 01/28/2023 Medical Record Number: [id number] Patient Account Number: [id number] Date of Birth/Sex: Treating RN: [birth year] (76 y.o. 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 C61 Malignant neoplasm of prostate E11.621 Type 2 diabetes mellitus with foot ulcer L97.512 Non-pressure chronic ulcer of other part of right foot with fat layer exposed Facility Procedures : CPT4 Code: [id number] Description: [id number] - APPLY TOTAL CONTACT LEG CAST ICD-10 Diagnosis Description L97.512 Non-pressure chronic  ulcer of other part of right foot with fat layer exposed Modifier: Quantity: 1 Physician Procedures : CPT4 Code Description Modifier [id number] - WC PHYS APPLY TOTAL CONTACT CAST ICD-10 Diagnosis Description L97.512 Non-pressure chronic ulcer of other part of right foot with fat layer exposed Quantity: 1 Electronic Signature(s) Signed: 01/28/2023 11:47:08 AM By: Bill Keeler PA-C Entered By: [name] on 01/28/2023 11:47:08

## 2023-01-30 ENCOUNTER — Encounter (HOSPITAL_BASED_OUTPATIENT_CLINIC_OR_DEPARTMENT_OTHER): Payer: Medicare Other | Admitting: Internal Medicine

## 2023-01-30 DIAGNOSIS — C61 Malignant neoplasm of prostate: Secondary | ICD-10-CM | POA: Diagnosis not present

## 2023-01-30 DIAGNOSIS — E11621 Type 2 diabetes mellitus with foot ulcer: Secondary | ICD-10-CM

## 2023-01-30 DIAGNOSIS — L97512 Non-pressure chronic ulcer of other part of right foot with fat layer exposed: Secondary | ICD-10-CM | POA: Diagnosis not present

## 2023-01-30 DIAGNOSIS — N3041 Irradiation cystitis with hematuria: Secondary | ICD-10-CM

## 2023-01-30 DIAGNOSIS — E785 Hyperlipidemia, unspecified: Secondary | ICD-10-CM | POA: Diagnosis not present

## 2023-01-30 DIAGNOSIS — E1122 Type 2 diabetes mellitus with diabetic chronic kidney disease: Secondary | ICD-10-CM | POA: Diagnosis not present

## 2023-01-30 DIAGNOSIS — E1140 Type 2 diabetes mellitus with diabetic neuropathy, unspecified: Secondary | ICD-10-CM | POA: Diagnosis not present

## 2023-01-30 DIAGNOSIS — I129 Hypertensive chronic kidney disease with stage 1 through stage 4 chronic kidney disease, or unspecified chronic kidney disease: Secondary | ICD-10-CM | POA: Diagnosis not present

## 2023-01-30 LAB — GLUCOSE, CAPILLARY
Glucose-Capillary: 198 mg/dL — ABNORMAL HIGH (ref 70–99)
Glucose-Capillary: 180 mg/dL — ABNORMAL HIGH (ref 70–99)

## 2023-01-30 NOTE — Progress Notes (Signed)
[name], [name] ([id number]) [id number].pdf Page 1 of 1 Visit Report for 01/28/2023 [name] Details Patient Name: Date of Service: [name] 01/28/2023 Medical Record Number: [id number] Patient Account Number: [id number] Date of Birth/Sex: Treating RN: [birth year] (77 y.o. 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 C61 Malignant neoplasm of prostate E11.621 Type 2 diabetes mellitus with foot ulcer L97.512 Non-pressure chronic ulcer of other part of right foot with fat layer exposed Facility Procedures CPT4 Code Description Modifier Quantity [id number]-(Facility Use Only) HBOT full body chamber, 35mn , 4 ICD-10 Diagnosis Description N30.41 Irradiation cystitis with hematuria C61 Malignant neoplasm of prostate E11.621 Type 2 diabetes mellitus with foot ulcer Physician Procedures Quantity CPT4 Code Description Modifier [id number] PHYS HYPERBARIC OXYGEN THERAPY 1 ICD-10 Diagnosis Description N30.41 Irradiation cystitis with hematuria C61 Malignant neoplasm of prostate E11.621 Type 2 diabetes mellitus with foot ulcer Electronic Signature(s) Signed: 01/28/2023 2:12:36 PM By: SDonavan BurnetCHT EMT BS , , Signed: 01/28/2023 5:47:19 PM By: SWorthy KeelerPA-C Entered By: SDonavan Burneton 01/28/2023 14:12:35

## 2023-01-30 NOTE — Progress Notes (Signed)
[name], [name] ([id number].pdf Page 1 of 2 Visit Report for 01/29/2023 HBO Details Patient Name: Date of Service: [name]. 01/29/2023 8:00 A M Medical Record Number: [id number] Patient Account Number: [id number] Date of Birth/Sex: Treating RN: 11-17-1946 (77 y.o. [name] Primary Care Makailah Slavick: [name], [name] Other Clinician: [name] Referring Theoren Palka: Treating Xayne Brumbaugh/Extender: [name], [name], [name] in Treatment: 5 HBO Treatment Course Details Treatment Course Number: 1 Ordering Farley Crooker: [name] T Treatments Ordered: otal 40 HBO Treatment Start Date: 01/05/2023 HBO Indication: Late Effect of Radiation HBO Treatment Details Treatment Number: 18 Patient Type: Outpatient Chamber Type: Monoplace Chamber Serial #: [id number] Treatment Protocol: 2.5 ATA with 90 minutes oxygen, with two 5 minute air breaks Treatment Details Compression Rate Down: 1.5 psi / minute De-Compression Rate Up: 2.0 psi / minute A breaks and breathing ir Compress Tx Pressure periods Decompress Decompress Begins Reached (leave unused spaces Begins Ends blank) Chamber Pressure (ATA 1 2.5 2.5 2.5 2.5 2.5 - - 2.5 1 ) Clock Time (24 hr) 07:57 08:12 08:42 08:47 09:17 09:22 - - 09:52 10:03 Treatment Length: 126 (minutes) Treatment Segments: 4 Vital Signs Capillary [name] Reference Range: 80 - 120 mg / dl HBO Diabetic [name] Intervention Range: <131 mg/dl or >249 mg/dl Type: Time Vitals [name] Respiratory Capillary [name] Pulse Action Pulse: Temperature: Taken: Pressure: Rate: Glucose (mg/dl): Meter #: Oximetry (%) Taken: Pre 07:52 149/78 77 18 98.8 173 1 none per protocol Post 10:09 158/86 79 18 98.8 150 1 none per protocol Treatment Response Treatment Toleration: Well Treatment Completion Status: Treatment Completed without Adverse Event Treatment Notes Patient arrived and prepared for treatment.  Vitals were taken chamber-side, all within normal limits. After safety check patient was placed in the chamber and chamber compressed at a rate of 1 psi/min until confirming normal ear equalization and then rate set was increased to 2 psi/min. Patient tolerated treatment and decompression of the chamber at 2 psi/min. Patient was stable upon discharge. Physician HBO Attestation: I certify that I supervised this HBO treatment in accordance with Medicare guidelines. A trained emergency response team is readily available per Yes hospital policies and procedures. Continue HBOT as ordered. Yes Electronic Signature(s) Signed: 01/30/2023 11:14:04 AM By: [name] DO Previous Signature: 01/29/2023 3:11:12 PM Version By: [name] , , Previous Signature: 01/30/2023 10:13:15 AM Version By: [name] DO Previous Signature: 01/29/2023 11:57:51 AM Version By: [name] , , Previous Signature: 01/29/2023 12:26:40 PM Version By: [name] DO Entered By: [name] on 01/30/2023 10:14:27 [name] ([id number].pdf Page 2 of 2 -------------------------------------------------------------------------------- HBO Safety Checklist Details Patient Name: Date of Service: [name]. 01/29/2023 8:00 A M Medical Record Number: [id number] Patient Account Number: [id number] Date of Birth/Sex: Treating RN: 01-Jul-1946 (77 y.o. [name] Primary Care Chesley Valls: [name], [name] Other Clinician: [name] Referring Ozil Stettler: Treating Alem Fahl/Extender: [name], [name], [name] in Treatment: 5 HBO Safety Checklist Items Safety Checklist Consent Form Signed Patient voided / foley secured and emptied When did you last eato 0630 Last dose of injectable or oral agent 0640 Ostomy pouch emptied and vented if applicable NA All implantable devices assessed, documented and approved Bard  PowerPort ClearVUE Intravenous access site secured and place NA Valuables secured Linens and cotton and cotton/polyester blend (less than 51% polyester) Personal oil-based products / skin lotions / body lotions removed Wigs or hairpieces removed NA Smoking or tobacco materials  removed NA Books / newspapers / magazines / loose paper removed Cologne, aftershave, perfume and deodorant removed Jewelry removed (may wrap wedding band) Make-up removed Hair care products removed Battery operated devices (external) removed Heating patches and chemical warmers removed Titanium eyewear removed Nail polish cured greater than 10 hours NA Casting material cured greater than 10 hours NA Hearing aids removed NA Loose dentures or partials removed NA Prosthetics have been removed NA Patient demonstrates correct use of air break device (if applicable) Patient concerns have been addressed Patient grounding bracelet on and cord attached to chamber Specifics for Inpatients (complete in addition to above) Medication sheet sent with patient NA Intravenous medications needed or due during therapy sent with patient NA Drainage tubes (e.g. nasogastric tube or chest tube secured and vented) NA Endotracheal or Tracheotomy tube secured NA Cuff deflated of air and inflated with saline NA Airway suctioned NA Notes Paper version used prior to treatment start. Electronic Signature(s) Signed: 01/29/2023 11:45:13 AM By: [name] , , Entered By: [name] on 01/29/2023 11:45:13

## 2023-01-30 NOTE — Progress Notes (Signed)
EVA, MOODIE ([id number]) [phone].pdf Page 1 of 1 Visit Report for 01/29/2023 [name] Details Patient Name: Date of Service: [name] 01/29/2023 Medical Record Number: [id number] Patient Account Number: [id number] Date of Birth/Sex: Treating RN: 1946/02/27 (77 y.o. 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 C61 Malignant neoplasm of prostate E11.621 Type 2 diabetes mellitus with foot ulcer L97.512 Non-pressure chronic ulcer of other part of right foot with fat layer exposed Facility Procedures CPT4 Code Description Modifier Quantity [id number] G0277-(Facility Use Only) HBOT full body chamber, 72mn , 4 ICD-10 Diagnosis Description N30.41 Irradiation cystitis with hematuria 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

## 2023-01-30 NOTE — Progress Notes (Signed)
HOWE, CHARLESWORTH ([id number].pdf Page 1 of 2 Visit Report for 01/29/2023 Arrival Information Details Patient Name: Date of Service: [name] 01/29/2023 8:00 A M Medical Record Number: [id number] Patient Account Number: [id number] Date of Birth/Sex: Treating RN: 10-02-1946 (77 y.o. [name] Primary Care Lariyah Shetterly: [name], [name] Other Clinician: [name] Referring Bralin Garry: Treating Ripley Bogosian/Extender: [name], [name], [name] in Treatment: 5 Visit Information History Since Last Visit All ordered tests and consults were completed: Yes Patient Arrived: [name] Added or deleted any medications: No Arrival Time: 07:30 Any new allergies or adverse reactions: No Accompanied By: self Had a fall or experienced change in No Transfer Assistance: None activities of daily living that may affect Patient Identification Verified: Yes risk of falls: Secondary Verification Process Completed: Yes Signs or symptoms of abuse/neglect since last visito No Patient Requires Transmission-Based No Hospitalized since last visit: No Precautions: Implantable device outside of the clinic excluding No Patient Has Alerts: Yes cellular tissue based products placed in the center Patient Alerts: Patient on Blood Thinner since last visit: LTBI: 0.88 09/25/2021 Pain Present Now: No VVS Electronic Signature(s) Signed: 01/29/2023 11:38:09 AM By: [name] , , Entered By: [name] on 01/29/2023 11:38:09 -------------------------------------------------------------------------------- Encounter Discharge Information Details Patient Name: Date of Service: [name], [name] 01/29/2023 8:00 A M Medical Record Number: [id number] Patient Account Number: [id number] Date of Birth/Sex: Treating RN: 06/29/1946 (77 y.o. [name] Primary Care Caresse Sedivy: [name], [name] Other Clinician: [name] Referring  Jimia Gentles: Treating Matas Burrows/Extender: [name], [name], [name] in Treatment: 5 Encounter Discharge Information Items Discharge Condition: Stable Ambulatory Status: Wheelchair Discharge Destination: Home Transportation: Private Auto Accompanied By: self Schedule Follow-up Appointment: No Clinical Summary of Care: Electronic Signature(s) Signed: 01/29/2023 11:59:24 AM By: [name] , , Entered By: [name] on 01/29/2023 11:59:24 Charna Busman, [name] ([id number].pdf Page 2 of 2 -------------------------------------------------------------------------------- Vitals Details Patient Name: Date of Service: [name] 01/29/2023 8:00 A M Medical Record Number: [id number] Patient Account Number: [id number] Date of Birth/Sex: Treating RN: 06/08/1946 (77 y.o. [name] Primary Care Brando Taves: [name], [name] Other Clinician: [name] Referring Ayden Hardwick: Treating Jarryd Gratz/Extender: [name], [name], [name] in Treatment: 5 Vital Signs Time Taken: 07:52 Temperature (F): 98.8 Height (in): 71 Pulse (bpm): 77 Weight (lbs): 252 Respiratory Rate (breaths/min): 18 Body Mass Index (BMI): 35.1 Blood Pressure (mmHg): 149/78 Capillary Blood Glucose (mg/dl): 173 Reference Range: 80 - 120 mg / dl Electronic Signature(s) Signed: 01/29/2023 11:38:41 AM By: [name] , , Entered By: [name] on 01/29/2023 11:38:41

## 2023-01-31 NOTE — Progress Notes (Signed)
SAW, PERELLI ([id number].pdf Page 1 of 2 Visit Report for 01/30/2023 HBO Details Patient Name: Date of Service: [name]. 01/30/2023 8:00 A M Medical Record Number: [id number] Patient Account Number: [id number] Date of Birth/Sex: Treating RN: 10-31-1946 (77 y.o. [name] Primary Care Moncia Annas: [name], [name] Other Clinician: [name] Referring Isham Smitherman: Treating Chavez Rosol/Extender: [name], [name], [name] in Treatment: 5 HBO Treatment Course Details Treatment Course Number: 1 Ordering Jenissa Tyrell: [name] T Treatments Ordered: otal 40 HBO Treatment Start Date: 01/05/2023 HBO Indication: Late Effect of Radiation HBO Treatment Details Treatment Number: 19 Patient Type: Outpatient Chamber Type: Monoplace Chamber Serial #: [id number] Treatment Protocol: 2.5 ATA with 90 minutes oxygen, with two 5 minute air breaks Treatment Details Compression Rate Down: 1.5 psi / minute De-Compression Rate Up: 2.0 psi / minute A breaks and breathing ir Compress Tx Pressure periods Decompress Decompress Begins Reached (leave unused spaces Begins Ends blank) Chamber Pressure (ATA 1 2.5 2.5 2.5 2.5 2.5 - - 2.5 1 ) Clock Time (24 hr) 07:55 08:11 08:41 08:46 09:16 09:21 - - 09:51 10:03 Treatment Length: 128 (minutes) Treatment Segments: 4 Vital Signs Capillary [name] Reference Range: 80 - 120 mg / dl HBO Diabetic [name] Intervention Range: <131 mg/dl or >249 mg/dl Type: Time Vitals [name] Respiratory Capillary [name] Pulse Action Pulse: Temperature: Taken: Pressure: Rate: Glucose (mg/dl): Meter #: Oximetry (%) Taken: Pre 07:48 138/72 75 18 98 198 1 none per protocol Post 10:10 159/86 77 18 98.6 180 1 none per protocol Treatment Response Treatment Toleration: Well Treatment Completion Status: Treatment Completed without Adverse Event Treatment Notes Patient arrived, dressed for treatment,  glucose measured 198 mg/dL. After safety check patient was placed in the chamber travelling at 1 psi/min until confirming normal ear equalization at which point rate set was increased to 2 psi/min. Patient tolerated treatment and subsequent decompression of the chamber at 2 psi/min. Patient was stable upon discharge. Physician HBO Attestation: I certify that I supervised this HBO treatment in accordance with Medicare guidelines. A trained emergency response team is readily available per Yes hospital policies and procedures. Continue HBOT as ordered. Yes Electronic Signature(s) Signed: 02/02/2023 10:41:51 AM By: [name] DO Previous Signature: 01/30/2023 2:03:15 PM Version By: [name] , , Previous Signature: 01/30/2023 1:50:31 PM Version By: [name] , , Entered By: [name] on 02/02/2023 10:41:38 [name] ([id number].pdf Page 2 of 2 -------------------------------------------------------------------------------- HBO Safety Checklist Details Patient Name: Date of Service: [name]. 01/30/2023 8:00 A M Medical Record Number: [id number] Patient Account Number: [id number] Date of Birth/Sex: Treating RN: 01-07-1946 (77 y.o. [name] Primary Care Vale Peraza: [name], [name] Other Clinician: [name] Referring Rona Tomson: Treating Trevione Wert/Extender: [name], [name], [name] in Treatment: 5 HBO Safety Checklist Items Safety Checklist Consent Form Signed Patient voided / foley secured and emptied When did you last eato 0630 Last dose of injectable or oral agent 0640 Ostomy pouch emptied and vented if applicable NA All implantable devices assessed, documented and approved Bard PowerPort ClearVUE Intravenous access site secured and place NA Valuables secured Linens and cotton and cotton/polyester blend (less than 51% polyester) Personal oil-based products /  skin lotions / body lotions removed Wigs or hairpieces removed NA Smoking or tobacco materials removed NA Books / newspapers / magazines / loose paper removed Cologne, aftershave, perfume and deodorant removed Jewelry removed (may wrap wedding band) Make-up removed NA Hair care  products removed Battery operated devices (external) removed Heating patches and chemical warmers removed Titanium eyewear removed Nail polish cured greater than 10 hours NA Casting material cured greater than 10 hours NA Hearing aids removed NA Loose dentures or partials removed NA Prosthetics have been removed NA Patient demonstrates correct use of air break device (if applicable) Patient concerns have been addressed Patient grounding bracelet on and cord attached to chamber Specifics for Inpatients (complete in addition to above) Medication sheet sent with patient NA Intravenous medications needed or due during therapy sent with patient NA Drainage tubes (e.g. nasogastric tube or chest tube secured and vented) NA Endotracheal or Tracheotomy tube secured NA Cuff deflated of air and inflated with saline NA Airway suctioned NA Notes Paper version used prior to treatment start. Electronic Signature(s) Signed: 01/30/2023 1:49:12 PM By: [name] , , Entered By: [name] on 01/30/2023 13:49:11

## 2023-01-31 NOTE — Progress Notes (Signed)
SEYMOUR, BENNETTS ([id number].pdf Page 1 of 2 Visit Report for 01/30/2023 Arrival Information Details Patient Name: Date of Service: [name] 01/30/2023 8:00 A M Medical Record Number: [id number] Patient Account Number: [id number] Date of Birth/Sex: Treating RN: Jun 29, 1946 (77 y.o. [name] Primary Care Nohlan Burdin: [name], [name] Other Clinician: [name] Referring Ziah Turvey: Treating Berdena Cisek/Extender: [name], [name], [name] in Treatment: 5 Visit Information History Since Last Visit All ordered tests and consults were completed: Yes Patient Arrived: [name] Added or deleted any medications: No Arrival Time: 07:30 Any new allergies or adverse reactions: No Accompanied By: self Had a fall or experienced change in No Transfer Assistance: None activities of daily living that may affect Patient Identification Verified: Yes risk of falls: Secondary Verification Process Completed: Yes Signs or symptoms of abuse/neglect since last visito No Patient Requires Transmission-Based No Hospitalized since last visit: No Precautions: Implantable device outside of the clinic excluding No Patient Has Alerts: Yes cellular tissue based products placed in the center Patient Alerts: Patient on Blood Thinner since last visit: LTBI: 0.88 09/25/2021 Pain Present Now: No VVS Electronic Signature(s) Signed: 01/30/2023 1:41:34 PM By: [name] , , Entered By: [name] on 01/30/2023 13:41:34 -------------------------------------------------------------------------------- Encounter Discharge Information Details Patient Name: Date of Service: [name], [name] 01/30/2023 8:00 A M Medical Record Number: [id number] Patient Account Number: [id number] Date of Birth/Sex: Treating RN: [birth year] (77 y.o. [name] Primary Care Maly Lemarr: [name], [name] Other Clinician: [name] Referring Benetta Maclaren: Treating Harry Shuck/Extender: [name], [name], [name] in Treatment: 5 Encounter Discharge Information Items Discharge Condition: Stable Ambulatory Status: Walker Discharge Destination: Home Transportation: Private Auto Accompanied By: self Schedule Follow-up Appointment: No Clinical Summary of Care: Electronic Signature(s) Signed: 01/30/2023 2:03:53 PM By: [name] , , Entered By: [name] on 01/30/2023 14:03:53 Charna Busman, [name] ([id number].pdf Page 2 of 2 -------------------------------------------------------------------------------- Vitals Details Patient Name: Date of Service: [name] 01/30/2023 8:00 A M Medical Record Number: [id number] Patient Account Number: [id number] Date of Birth/Sex: Treating RN: 01/16/1946 (77 y.o. [name] Primary Care Hollye Pritt: [name], [name] Other Clinician: [name] Referring Tayden Nichelson: Treating Marsden Zaino/Extender: [name], [name], [name] in Treatment: 5 Vital Signs Time Taken: 07:48 Temperature (F): 98.0 Height (in): 71 Pulse (bpm): 75 Weight (lbs): 252 Respiratory Rate (breaths/min): 18 Body Mass Index (BMI): 35.1 Blood Pressure (mmHg): 138/72 Capillary Blood Glucose (mg/dl): 198 Reference Range: 80 - 120 mg / dl Electronic Signature(s) Signed: 01/30/2023 1:47:59 PM By: [name] , , Entered By: [name] on 01/30/2023 13:47:59

## 2023-02-02 ENCOUNTER — Encounter (HOSPITAL_BASED_OUTPATIENT_CLINIC_OR_DEPARTMENT_OTHER): Payer: Medicare Other | Admitting: Internal Medicine

## 2023-02-02 DIAGNOSIS — E1121 Type 2 diabetes mellitus with diabetic nephropathy: Secondary | ICD-10-CM | POA: Diagnosis not present

## 2023-02-02 DIAGNOSIS — L97512 Non-pressure chronic ulcer of other part of right foot with fat layer exposed: Secondary | ICD-10-CM | POA: Diagnosis not present

## 2023-02-02 DIAGNOSIS — C61 Malignant neoplasm of prostate: Secondary | ICD-10-CM | POA: Diagnosis not present

## 2023-02-02 DIAGNOSIS — N3041 Irradiation cystitis with hematuria: Secondary | ICD-10-CM

## 2023-02-02 DIAGNOSIS — E11621 Type 2 diabetes mellitus with foot ulcer: Secondary | ICD-10-CM | POA: Diagnosis not present

## 2023-02-02 DIAGNOSIS — E785 Hyperlipidemia, unspecified: Secondary | ICD-10-CM | POA: Diagnosis not present

## 2023-02-02 DIAGNOSIS — I129 Hypertensive chronic kidney disease with stage 1 through stage 4 chronic kidney disease, or unspecified chronic kidney disease: Secondary | ICD-10-CM | POA: Diagnosis not present

## 2023-02-02 DIAGNOSIS — E1122 Type 2 diabetes mellitus with diabetic chronic kidney disease: Secondary | ICD-10-CM | POA: Diagnosis not present

## 2023-02-02 DIAGNOSIS — E1140 Type 2 diabetes mellitus with diabetic neuropathy, unspecified: Secondary | ICD-10-CM | POA: Diagnosis not present

## 2023-02-02 LAB — GLUCOSE, CAPILLARY
Glucose-Capillary: 183 mg/dL — ABNORMAL HIGH (ref 70–99)
Glucose-Capillary: 138 mg/dL — ABNORMAL HIGH (ref 70–99)

## 2023-02-02 NOTE — Progress Notes (Signed)
[name], [name] ([id number]) [phone].pdf Page 1 of 1 Visit Report for 01/30/2023 [name] Details Patient Name: Date of Service: [name]. 01/30/2023 Medical Record Number: [id number] Patient Account Number: [id number] Date of Birth/Sex: Treating RN: 06/25/1946 (77 y.o. 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 Malignant neoplasm of prostate E11.621 Type 2 diabetes mellitus with foot ulcer L97.512 Non-pressure chronic ulcer of other part of right foot with fat layer exposed Facility Procedures CPT4 Code Description Modifier Quantity [id number] G0277-(Facility Use Only) HBOT full body chamber, 61mn , 4 ICD-10 Diagnosis Description N30.41 Irradiation cystitis with hematuria 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

## 2023-02-02 NOTE — Progress Notes (Addendum)
HAKOP, KIRT ([id number].pdf Page 1 of 2 Visit Report for 02/02/2023 Arrival Information Details Patient Name: Date of Service: SPA Bill [name] 02/02/2023 8:00 A M Medical Record Number: [id number] Patient Account Number: [id number] Date of Birth/Sex: Treating RN: [birth year] (77 y.o. Bill [name], Bill [name] Primary Care Bill [name]: Bill [name] Other Clinician: [name] Referring Raymie Trani: Treating Aziah Kaiser/Extender: [name], Jessica Bill [name] Weeks in Treatment: 6 Visit Information History Since Last Visit All ordered tests and consults were completed: Yes Patient Arrived: Bill [name] Added or deleted any medications: No Arrival Time: 07:23 Any new allergies or adverse reactions: No Accompanied By: self Had a fall or experienced change in No Transfer Assistance: None activities of daily living that may affect Patient Identification Verified: Yes risk of falls: Secondary Verification Process Completed: Yes Signs or symptoms of abuse/neglect since last visito No Patient Requires Transmission-Based No Hospitalized since last visit: No Precautions: Implantable device outside of the clinic excluding No Patient Has Alerts: Yes cellular tissue based products placed in the center Patient Alerts: Patient on [name] since last visit: LTBI: 0.88 09/25/2021 Pain Present Now: No VVS Electronic Signature(s) Signed: 02/02/2023 9:00:10 AM By: [name] , , Entered By: [name] on 02/02/2023 09:00:10 -------------------------------------------------------------------------------- Encounter Discharge Information Details Patient Name: Date of Service: [facility], [name] 02/02/2023 8:00 A M Medical Record Number: [id number] Patient Account Number: [id number] Date of Birth/Sex: Treating RN: 09-Apr-1946 (77 y.o. Bill [name] Primary Care Bill [name]: Bill [name] Other Clinician: [name] Referring  Grae Leathers: Treating Nivea Wojdyla/Extender: [name], Jessica Bill [name] Weeks in Treatment: 6 Encounter Discharge Information Items Discharge Condition: Stable Ambulatory Status: Walker Discharge Destination: Home Transportation: Private Auto Accompanied By: self Schedule Follow-up Appointment: No Clinical Summary of Care: Electronic Signature(s) Signed: 02/02/2023 12:52:26 PM By: [name] , , Entered By: [name] on 02/02/2023 12:52:25 Bill [name] ([id number].pdf Page 2 of 2 -------------------------------------------------------------------------------- Vitals Details Patient Name: Date of Service: SPA Bill [name]. 02/02/2023 8:00 A M Medical Record Number: [id number] Patient Account Number: [id number] Date of Birth/Sex: Treating RN: 04-02-1946 (77 y.o. Bill [name] Primary Care Lilinoe Acklin: Bill [name] Other Clinician: [name] Referring Manoj Enriquez: Treating Jaquez Farrington/Extender: [name], Jessica Bill [name] Weeks in Treatment: 6 Vital Signs Time Taken: 07:37 Temperature (F): 97.9 Height (in): 71 Pulse (bpm): 83 Weight (lbs): 252 Respiratory Rate (breaths/min): 18 Body Mass Index (BMI): 35.1 [name]: 142/67 Capillary [name]: 183 Reference Range: 80 - 120 mg / dl Electronic Signature(s) Signed: 02/02/2023 9:01:03 AM By: [name] , , Entered By: [name] on 02/02/2023 09:01:03

## 2023-02-02 NOTE — Progress Notes (Addendum)
YUGAN, FRANTOM ([id number].pdf Page 1 of 2 Visit Report for 02/02/2023 HBO Details Patient Name: Date of Service: [name]. 02/02/2023 8:00 A M Medical Record Number: [id number] Patient Account Number: [id number] Date of Birth/Sex: Treating RN: [birth year] (77 y.o. [name], [name] Primary Care Rozella Servello: [name], [name] Other Clinician: [name] Referring Sarahgrace Broman: Treating Rosemaria Inabinet/Extender: [name], [name], [name] in Treatment: 6 HBO Treatment Course Details Treatment Course Number: 1 Ordering Kentravious Lipford: [name] T Treatments Ordered: otal 40 HBO Treatment Start Date: 01/05/2023 HBO Indication: Late Effect of Radiation HBO Treatment Details Treatment Number: 20 Patient Type: Outpatient Chamber Type: Monoplace Chamber Serial #: [id number] Treatment Protocol: 2.5 ATA with 90 minutes oxygen, with two 5 minute air breaks Treatment Details Compression Rate Down: 1.5 psi / minute De-Compression Rate Up: 2.0 psi / minute A breaks and breathing ir Compress Tx Pressure periods Decompress Decompress Begins Reached (leave unused spaces Begins Ends blank) Chamber Pressure (ATA 1 2.5 2.5 2.5 2.5 2.5 - - 2.5 1 ) Clock Time (24 hr) 07:48 08:06 08:36 08:41 09:11 09:16 - - 09:46 09:57 Treatment Length: 129 (minutes) Treatment Segments: 4 Vital Signs Capillary [name] Reference Range: 80 - 120 mg / dl HBO Diabetic [name] Intervention Range: <131 mg/dl or >249 mg/dl Time Vitals [name] Respiratory Capillary [name] Pulse Action Type: Pulse: Temperature: Taken: Pressure: Rate: Glucose (mg/dl): Meter #: Oximetry (%) Taken: Pre 07:37 142/67 83 18 97.9 183 Treatment Response Treatment Toleration: Well Treatment Completion Status: Treatment Completed without Adverse Event Treatment Notes Patient arrived, prepared for treatment, glucose measured 183 mg/dL. After safety check patient was placed in the  chamber travelling at 1 psi/min until confirming normal ear equalization at which point rate set was increased to 2 psi/min and patient tolerated travel until reaching treatment depth of 2.5 ATA. Patient's vital signs were within normal limits and he was stable upon discharge. Physician HBO Attestation: I certify that I supervised this HBO treatment in accordance with Medicare guidelines. A trained emergency response team is readily available per Yes hospital policies and procedures. Continue HBOT as ordered. Yes Electronic Signature(s) Signed: 02/03/2023 12:46:05 PM By: [name] , , Signed: 02/03/2023 1:21:56 PM By: [name] DO Previous Signature: 02/02/2023 3:41:51 PM Version By: [name] DO Previous Signature: 02/02/2023 12:51:15 PM Version By: [name] , , Previous Signature: 02/02/2023 3:39:58 PM Version By: [name] DO Previous Signature: 02/02/2023 12:50:49 PM Version By: [name] , , Previous Signature: 02/02/2023 9:03:23 AM Version By: [name] , , Previous Signature: 02/02/2023 10:35:38 AM Version By: [name] DO Entered By: [name] on 02/03/2023 12:46:05 [name] ([id number] Page 2 of 2 -------------------------------------------------------------------------------- HBO Safety Checklist Details Patient Name: Date of Service: [name]. 02/02/2023 8:00 A M Medical Record Number: [id number] Patient Account Number: [id number] Date of Birth/Sex: Treating RN: 25-Sep-1946 (77 y.o. [name] Primary Care Shanyiah Conde: [name], [name] Other Clinician: [name] Referring Jossalyn Forgione: Treating Romario Tith/Extender: [name], [name], [name] in Treatment: 6 HBO Safety Checklist Items Safety Checklist Consent Form Signed Patient voided / foley secured and emptied When did you last eato 0630 Last  dose of injectable or oral agent 0635 Ostomy pouch emptied and vented if applicable NA All implantable devices assessed, documented and approved Bard PowerPort ClearVUE Intravenous access site secured and place NA Valuables secured Linens and cotton and cotton/polyester blend (less than 51%  polyester) Personal oil-based products / skin lotions / body lotions removed Wigs or hairpieces removed NA Smoking or tobacco materials removed NA Books / newspapers / magazines / loose paper removed Cologne, aftershave, perfume and deodorant removed Jewelry removed (may wrap wedding band) Make-up removed NA Hair care products removed Battery operated devices (external) removed Heating patches and chemical warmers removed Titanium eyewear removed Nail polish cured greater than 10 hours NA Casting material cured greater than 10 hours NA Hearing aids removed NA Loose dentures or partials removed NA Prosthetics have been removed NA Patient demonstrates correct use of air break device (if applicable) Patient concerns have been addressed Patient grounding bracelet on and cord attached to chamber Specifics for Inpatients (complete in addition to above) Medication sheet sent with patient NA Intravenous medications needed or due during therapy sent with patient NA Drainage tubes (e.g. nasogastric tube or chest tube secured and vented) NA Endotracheal or Tracheotomy tube secured NA Cuff deflated of air and inflated with saline NA Airway suctioned NA Notes Paper version used prior to treatment start. Electronic Signature(s) Signed: 02/02/2023 9:02:10 AM By: [name] , , Entered By: [name] on 02/02/2023 09:02:10

## 2023-02-03 ENCOUNTER — Encounter (HOSPITAL_BASED_OUTPATIENT_CLINIC_OR_DEPARTMENT_OTHER): Payer: Medicare Other | Admitting: Internal Medicine

## 2023-02-03 DIAGNOSIS — N3041 Irradiation cystitis with hematuria: Secondary | ICD-10-CM

## 2023-02-03 DIAGNOSIS — C61 Malignant neoplasm of prostate: Secondary | ICD-10-CM | POA: Diagnosis not present

## 2023-02-03 DIAGNOSIS — I129 Hypertensive chronic kidney disease with stage 1 through stage 4 chronic kidney disease, or unspecified chronic kidney disease: Secondary | ICD-10-CM | POA: Diagnosis not present

## 2023-02-03 DIAGNOSIS — E1122 Type 2 diabetes mellitus with diabetic chronic kidney disease: Secondary | ICD-10-CM | POA: Diagnosis not present

## 2023-02-03 DIAGNOSIS — L97512 Non-pressure chronic ulcer of other part of right foot with fat layer exposed: Secondary | ICD-10-CM | POA: Diagnosis not present

## 2023-02-03 DIAGNOSIS — E1140 Type 2 diabetes mellitus with diabetic neuropathy, unspecified: Secondary | ICD-10-CM | POA: Diagnosis not present

## 2023-02-03 DIAGNOSIS — E11621 Type 2 diabetes mellitus with foot ulcer: Secondary | ICD-10-CM

## 2023-02-03 DIAGNOSIS — E785 Hyperlipidemia, unspecified: Secondary | ICD-10-CM | POA: Diagnosis not present

## 2023-02-03 LAB — GLUCOSE, CAPILLARY
Glucose-Capillary: 192 mg/dL — ABNORMAL HIGH (ref 70–99)
Glucose-Capillary: 109 mg/dL — ABNORMAL HIGH (ref 70–99)

## 2023-02-03 NOTE — Progress Notes (Signed)
PARAM, HURD ([id number].pdf Page 1 of 1 Visit Report for 02/02/2023 [name] Details Patient Name: Date of Service: [place]. 02/02/2023 Medical Record Number: [id number] Patient Account Number: [id number] Date of Birth/Sex: Treating RN: 04/26/1946 (77 y.o. 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 Malignant neoplasm of prostate E11.621 Type 2 diabetes mellitus with foot ulcer L97.512 Non-pressure chronic ulcer of other part of right foot with fat layer exposed Facility Procedures CPT4 Code Description Modifier Quantity [id number] G0277-(Facility Use Only) HBOT full body chamber, 41mn , 4 ICD-10 Diagnosis Description 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

## 2023-02-04 ENCOUNTER — Encounter (HOSPITAL_BASED_OUTPATIENT_CLINIC_OR_DEPARTMENT_OTHER): Payer: Medicare Other | Admitting: Physician Assistant

## 2023-02-04 DIAGNOSIS — L97512 Non-pressure chronic ulcer of other part of right foot with fat layer exposed: Secondary | ICD-10-CM | POA: Diagnosis not present

## 2023-02-04 DIAGNOSIS — L598 Other specified disorders of the skin and subcutaneous tissue related to radiation: Secondary | ICD-10-CM | POA: Diagnosis not present

## 2023-02-04 DIAGNOSIS — E785 Hyperlipidemia, unspecified: Secondary | ICD-10-CM | POA: Diagnosis not present

## 2023-02-04 DIAGNOSIS — E1140 Type 2 diabetes mellitus with diabetic neuropathy, unspecified: Secondary | ICD-10-CM | POA: Diagnosis not present

## 2023-02-04 DIAGNOSIS — E1122 Type 2 diabetes mellitus with diabetic chronic kidney disease: Secondary | ICD-10-CM | POA: Diagnosis not present

## 2023-02-04 DIAGNOSIS — E11621 Type 2 diabetes mellitus with foot ulcer: Secondary | ICD-10-CM | POA: Diagnosis not present

## 2023-02-04 DIAGNOSIS — I129 Hypertensive chronic kidney disease with stage 1 through stage 4 chronic kidney disease, or unspecified chronic kidney disease: Secondary | ICD-10-CM | POA: Diagnosis not present

## 2023-02-04 LAB — GLUCOSE, CAPILLARY
Glucose-Capillary: 244 mg/dL — ABNORMAL HIGH (ref 70–99)
Glucose-Capillary: 228 mg/dL — ABNORMAL HIGH (ref 70–99)

## 2023-02-04 NOTE — Progress Notes (Signed)
ROJELIO, VULTAGGIO ([id number]) [phone].pdf Page 1 of 2 Visit Report for 02/03/2023 Arrival Information Details Patient Name: Date of Service: [name] [name] 02/03/2023 8:00 [name] M Medical Record Number: [id number] Patient Account Number: [id number] Date of Birth/Sex: Treating RN: 04/20/1946 (77 y.o. [name] [name], [name] Primary Care Lennyn Bellanca: [name] [name] Other Clinician: [name] Referring Kaoir Loree: Treating Dayzha Pogosyan/Extender: [name], [name] [name] Weeks in Treatment: 6 Visit Information History Since Last Visit All ordered tests and consults were completed: Yes Patient Arrived: [name] [name] Added or deleted any medications: No Arrival Time: 07:23 Any new allergies or adverse reactions: No Accompanied By: self Had [name] fall or experienced change in No Transfer Assistance: None activities of daily living that may affect Patient Identification Verified: Yes risk of falls: Secondary Verification Process Completed: Yes Signs or symptoms of abuse/neglect since last visito No Patient Requires Transmission-Based No Hospitalized since last visit: No Precautions: Implantable device outside of the clinic excluding No Patient Has Alerts: Yes cellular tissue based products placed in the [name] Patient Alerts: Patient on [name] since last visit: LTBI: 0.88 09/25/2021 Pain Present Now: No VVS Electronic Signature(s) Signed: 02/03/2023 8:53:25 AM By: [name] , , Entered By: [name] on 02/03/2023 08:53:25 -------------------------------------------------------------------------------- Encounter Discharge Information Details Patient Name: Date of Service: [name] [name], [name] [name] 02/03/2023 8:00 [name] M Medical Record Number: [id number] Patient Account Number: [id number] Date of Birth/Sex: Treating RN: [birth year] (78 y.o. [name] [name], [name] Primary Care Kahmya Pinkham: [name] [name] Other Clinician: [name] Referring Francely Craw: Treating Teirra Carapia/Extender: [name], [name] [name] Weeks in Treatment: 6 Encounter Discharge Information Items Discharge Condition: Stable Ambulatory Status: Walker Discharge Destination: Home Transportation: Private Auto Accompanied By: self Schedule Follow-up Appointment: No Clinical Summary of Care: Electronic Signature(s) Signed: 02/03/2023 11:37:19 AM By: [name] , , Entered By: [name] on 02/03/2023 11:37:19 [name] [name] ([id number] Page 2 of 2 -------------------------------------------------------------------------------- Vitals Details Patient Name: Date of Service: [name] [name] 02/03/2023 8:00 [name] M Medical Record Number: [id number] Patient Account Number: [id number] Date of Birth/Sex: Treating RN: [birth year] (77 y.o. [name] [name], [name] Primary Care Neo Yepiz: [name] [name] Other Clinician: [name] Referring Son Barkan: Treating Dejay Kronk/Extender: [name], [name] [name] Weeks in Treatment: 6 Vital Signs Time Taken: 07:46 Temperature (F): 98.2 Height (in): 71 Pulse (bpm): 75 Weight (lbs): 252 Respiratory Rate (breaths/min): 18 Body Mass Index (BMI): 35.1 [name]: 121/56 Capillary [name]: 192 Reference Range: 80 - 120 mg / dl Electronic Signature(s) Signed: 02/03/2023 8:56:22 AM By: [name] , , Entered By: [name] on 02/03/2023 08:56:22

## 2023-02-04 NOTE — Progress Notes (Signed)
RAINE, ALWARDT ([id number]) [id number] Page 1 of 1 Visit Report for 02/03/2023 [name] Details Patient Name: Date of Service: [place]. 02/03/2023 Medical Record Number: [id number] Patient Account Number: [id number] Date of Birth/Sex: Treating RN: 17-Mar-1946 (77 y.o. 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 Malignant neoplasm of prostate E11.621 Type 2 diabetes mellitus with foot ulcer L97.512 Non-pressure chronic ulcer of other part of right foot with fat layer exposed Facility Procedures CPT4 Code Description Modifier Quantity [id number] G0277-(Facility Use Only) HBOT full body chamber, 19mn , 4 ICD-10 Diagnosis Description N30.41 Irradiation cystitis with hematuria 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

## 2023-02-04 NOTE — Progress Notes (Addendum)
YUJI, GISMONDI ([id number]) [id number] [id number].pdf Page 1 of 2 Visit Report for 02/03/2023 HBO Details Patient Name: Date of Service: [name] 02/03/2023 8:00 A M Medical Record Number: [id number] Patient Account Number: [id number] Date of Birth/Sex: Treating RN: May 05, 1946 (77 y.o. [name], [name] Primary Care Brentney Goldbach: [name], [name] Other Clinician: Donavan [name] Referring [name]: Treating [name]/Extender: [name] [name], [name] Weeks in Treatment: 6 HBO Treatment Course Details Treatment Course Number: 1 Ordering [name]: [name] T Treatments Ordered: otal 40 HBO Treatment Start Date: 01/05/2023 HBO Indication: Late Effect of Radiation HBO Treatment Details Treatment Number: 21 Patient Type: Outpatient Chamber Type: Monoplace Chamber Serial #: [id number] Treatment Protocol: 2.5 ATA with 90 minutes oxygen, with two 5 minute air breaks Treatment Details Compression Rate Down: 1.5 psi / minute De-Compression Rate Up: 2.0 psi / minute A breaks and breathing ir Compress Tx Pressure periods Decompress Decompress Begins Reached (leave unused spaces Begins Ends blank) Chamber Pressure (ATA 1 2.5 2.5 2.5 2.5 2.5 - - 2.5 1 ) Clock Time (24 hr) 07:51 08:06 08:36 08:41 09:11 09:16 - - 09:46 09:57 Treatment Length: 126 (minutes) Treatment Segments: 4 Vital Signs Capillary [name] Reference Range: 80 - 120 mg / dl HBO Diabetic [name] Intervention Range: <131 mg/dl or >249 mg/dl Type: Time Vitals [name] Respiratory Capillary [name] Pulse Action Pulse: Temperature: Taken: Pressure: Rate: Glucose (mg/dl): Meter #: Oximetry (%) Taken: Pre 07:46 121/56 75 18 98.2 192 1 none per protocol Post 10:02 154/82 75 18 98.2 109 1 none per protocol Treatment Response Treatment Toleration: Well Treatment Completion Status: Treatment Completed without Adverse Event Treatment Notes Patient arrived, prepared for  treatment, glucose measured 192 mg/dL. Patient self-administered Afrin today. After safety check, patient was placed in the chamber and chamber was compressed at 1 psi/min until confirmation of normal ear equalization and then rate set was increased to 2 psi/min. Patient tolerated treatment and decompression of the chamber at 2 psi/min. Patient was stable upon discharge. Physician HBO Attestation: I certify that I supervised this HBO treatment in accordance with Medicare guidelines. A trained emergency response team is readily available per Yes hospital policies and procedures. Continue HBOT as ordered. Yes Electronic Signature(s) Signed: 02/03/2023 3:41:53 PM By: Bill Shan DO Previous Signature: 02/03/2023 11:35:59 AM Version By: [name] CHT EMT BS , , Previous Signature: 02/03/2023 11:35:22 AM Version By: [name] CHT EMT BS , , Previous Signature: 02/03/2023 9:06:52 AM Version By: [name] CHT EMT BS , , Entered By: [name] on 02/03/2023 14:49:03 Betti [name] ([id number] Page 2 of 2 -------------------------------------------------------------------------------- HBO Safety Checklist Details Patient Name: Date of Service: [name] 02/03/2023 8:00 A M Medical Record Number: [id number] Patient Account Number: [id number] Date of Birth/Sex: Treating RN: 1946/08/24 (77 y.o. [name], [name] Primary Care [name]: [name], [name] Other Clinician: Donavan [name] Referring [name]: Treating [name]/Extender: [name] [name], [name] Weeks in Treatment: 6 HBO Safety Checklist Items Safety Checklist Consent Form Signed Patient voided / foley secured and emptied When did you last eato 0630 Last dose of injectable or oral agent Yesterday Ostomy pouch emptied and vented if applicable NA All implantable devices assessed, documented and approved Bard PowerPort ClearVUE Intravenous  access site secured and place NA Valuables secured Linens and cotton and cotton/polyester blend (less than 51% polyester) Personal oil-based products / skin lotions / body lotions removed Wigs or hairpieces removed NA Smoking or tobacco materials removed NA Books / newspapers / magazines /  loose paper removed Cologne, aftershave, perfume and deodorant removed Jewelry removed (may wrap wedding band) Make-up removed NA Hair care products removed Battery operated devices (external) removed Heating patches and chemical warmers removed Titanium eyewear removed Nail polish cured greater than 10 hours NA Casting material cured greater than 10 hours Cast applied more than 10 hrs ago Hearing aids removed NA Loose dentures or partials removed NA Prosthetics have been removed NA Patient demonstrates correct use of air break device (if applicable) Patient concerns have been addressed Patient grounding bracelet on and cord attached to chamber Specifics for Inpatients (complete in addition to above) Medication sheet sent with patient NA Intravenous medications needed or due during therapy sent with patient NA Drainage tubes (e.g. nasogastric tube or chest tube secured and vented) NA Endotracheal or Tracheotomy tube secured NA Cuff deflated of air and inflated with saline NA Airway suctioned NA Notes Paper version used prior to treatment start. Electronic Signature(s) Signed: 02/03/2023 8:57:49 AM By: [name] CHT EMT BS , , Entered By: [name] on 02/03/2023 08:57:48

## 2023-02-04 NOTE — Progress Notes (Signed)
ZABDI, KASAL ([id number]) [id number] Page 1 of 8 Visit Report for 02/02/2023 Arrival Information Details Patient Name: Date of Service: [name] 02/02/2023 10:15 A M Medical Record Number: [id number] Patient Account Number: [id number] Date of Birth/Sex: Treating RN: 1946-04-19 (77 y.o. [name], [name] Primary Care Jaydalynn Olivero: [name], [name] Other Clinician: Referring Aleigh Grunden: Treating Lavanya Roa/Extender: [name], [name], [name] in Treatment: 6 Visit Information History Since Last Visit Added or deleted any medications: No Patient Arrived: [name] Any new allergies or adverse reactions: No Arrival Time: 10:25 Had a fall or experienced change in No Accompanied By: self activities of daily living that may affect Transfer Assistance: None risk of falls: Patient Identification Verified: Yes Signs or symptoms of abuse/neglect since last visito No Secondary Verification Process Completed: Yes Hospitalized since last visit: No Patient Requires Transmission-Based No Implantable device outside of the clinic excluding No Precautions: cellular tissue based products placed in the center Patient Has Alerts: Yes since last visit: Patient Alerts: Patient on Blood Thinner Has Dressing in Place as Prescribed: Yes LTBI: 0.88 09/25/2021 Has Footwear/Offloading in Place as Prescribed: Yes VVS Right: T Contact Cast otal Pain Present Now: No Electronic Signature(s) Signed: 02/03/2023 4:50:49 PM By: Deon Pilling RN, BSN Entered By: Deon Pilling on 02/02/2023 10:32:31 -------------------------------------------------------------------------------- Encounter Discharge Information Details Patient Name: Date of Service: [facility], [name] 02/02/2023 10:15 A M Medical Record Number: [id number] Patient Account Number: [id number] Date of Birth/Sex: Treating RN: September 12, 1946 (77 y.o. Mare Ferrari Primary Care Fia Hebert: [name], [name] Other  Clinician: Referring Tunis Gentle: Treating Mansur Patti/Extender: [name], [name], [name] in Treatment: 6 Encounter Discharge Information Items Discharge Condition: Stable Ambulatory Status: Walker Discharge Destination: Home Transportation: Private Auto Accompanied By: self Schedule Follow-up Appointment: Yes Clinical Summary of Care: Patient Declined Electronic Signature(s) Signed: 02/02/2023 4:17:27 PM By: Sharyn Creamer RN, BSN Entered By: Sharyn Creamer on 02/02/2023 11:12:06 [name] ([id number] Page 2 of 8 -------------------------------------------------------------------------------- Lower Extremity Assessment Details Patient Name: Date of Service: [name] 02/02/2023 10:15 A M Medical Record Number: [id number] Patient Account Number: [id number] Date of Birth/Sex: Treating RN: [birth year] (77 y.o. [name] Primary Care Joann Jorge: [name], [name] Other Clinician: Referring Jshon Ibe: Treating Britlee Skolnik/Extender: [name], [name], [name] in Treatment: 6 Edema Assessment Assessed: [Left: No] [Right: Yes] Edema: [Left: N] [Right: o] Calf Left: Right: Point of Measurement: 38 cm From Medial Instep 37 cm Ankle Left: Right: Point of Measurement: 13 cm From Medial Instep 26 cm Vascular Assessment Pulses: Dorsalis Pedis Palpable: [Right:Yes] Electronic Signature(s) Signed: 02/03/2023 4:50:49 PM By: Deon Pilling RN, BSN Entered By: Deon Pilling on 02/02/2023 10:33:17 -------------------------------------------------------------------------------- Multi Wound Chart Details Patient Name: Date of Service: [name], [name] 02/02/2023 10:15 A M Medical Record Number: [id number] Patient Account Number: [id number] Date of Birth/Sex: Treating RN: 1946-01-15 (77 y.o. M) Primary Care Derrisha Foos: [name], [name] Other Clinician: Referring Yazlynn Birkeland: Treating Kimo Bancroft/Extender: [name],  [name], [name] in Treatment: 6 [1:Photos:] [N/A:N/A] Right Metatarsal head fifth N/A N/A Wound Location: Gradually Appeared N/A N/A Wounding Event: Diabetic Wound/Ulcer of the Lower N/A N/A Primary Etiology: Extremity Anemia, Sleep Apnea, Arrhythmia, N/A N/A Comorbid History: Coronary Artery Disease, Hypertension, Myocardial Infarction, Type II Diabetes, Osteoarthritis, Neuropathy, Received Chemotherapy, Received Radiation ELRICK, BENHAM L ([id number]) [id number] Page 3 of 8 01/22/2022 N/A N/A Date Acquired: 1 N/A N/A Weeks of Treatment: Open N/A N/A Wound Status: No N/A N/A Wound Recurrence: 0.1x0.1x0.1 N/A N/A Measurements L x W x  D (cm) 0.008 N/A N/A A (cm) : rea 0.001 N/A N/A Volume (cm) : 74.20% N/A N/A % Reduction in A rea: 88.90% N/A N/A % Reduction in Volume: Grade 1 N/A N/A Classification: Medium N/A N/A Exudate A mount: Serosanguineous N/A N/A Exudate Type: red, brown N/A N/A Exudate Color: Distinct, outline attached N/A N/A Wound Margin: Large (67-100%) N/A N/A Granulation A mount: Red, Pink, Pale N/A N/A Granulation Quality: None Present (0%) N/A N/A Necrotic A mount: Fat Layer (Subcutaneous Tissue): Yes N/A N/A Exposed Structures: Fascia: No Tendon: No Muscle: No Joint: No Bone: No Large (67-100%) N/A N/A Epithelialization: Callus: Yes N/A N/A Periwound Skin Texture: Excoriation: No Induration: No Crepitus: No Rash: No Scarring: No Dry/Scaly: Yes N/A N/A Periwound Skin Moisture: Maceration: No Atrophie Blanche: No N/A N/A Periwound Skin Color: Cyanosis: No Ecchymosis: No Erythema: No Hemosiderin Staining: No Mottled: No Pallor: No Rubor: No Treatment Notes Electronic Signature(s) Signed: 02/02/2023 3:39:58 PM By: [name] DO Entered By: [name] on 02/02/2023  10:54:22 -------------------------------------------------------------------------------- Multi-Disciplinary Care Plan Details Patient Name: Date of Service: [facility], [name] 02/02/2023 10:15 A M Medical Record Number: [id number] Patient Account Number: [id number] Date of Birth/Sex: Treating RN: 12/07/1946 (77 y.o. [name] Primary Care Sanjana Folz: [name], [name] Other Clinician: Referring Calandra Madura: Treating Eleshia Wooley/Extender: [name], [name], [name] in Treatment: 6 Active Inactive HBO Nursing Diagnoses: Anxiety related to feelings of confinement associated with the hyperbaric oxygen chamber Potential for barotraumas to ears, sinuses, teeth, and lungs or cerebral gas embolism related to changes in atmospheric pressure inside hyperbaric oxygen chamber Goals: Patient will tolerate the hyperbaric oxygen therapy treatment Date Initiated: 12/22/2022 Target Resolution Date: 02/13/2023 Goal Status: Active Patient/caregiver will verbalize understanding of HBO goals, rationale, procedures and potential hazards Brocker, Hence L ([id number]) [phone].pdf Page 4 of 8 Date Initiated: 12/22/2022 Target Resolution Date: 02/13/2023 Goal Status: Active Interventions: Assess and provide for patients comfort related to the hyperbaric environment and equalization of middle ear Assess patient for any history of confinement anxiety Notes: Pain, Acute or Chronic Nursing Diagnoses: Pain, acute or chronic: actual or potential Potential alteration in comfort, pain Goals: Patient will verbalize adequate pain control and receive pain control interventions during procedures as needed Date Initiated: 01/22/2023 Target Resolution Date: 02/18/2023 Goal Status: Active Interventions: Encourage patient to take pain medications as prescribed Provide education on pain management Treatment Activities: Administer pain control measures as ordered :  01/22/2023 Notes: Wound/Skin Impairment Nursing Diagnoses: Knowledge deficit related to ulceration/compromised skin integrity Goals: Patient/caregiver will verbalize understanding of skin care regimen Date Initiated: 01/22/2023 Target Resolution Date: 02/20/2023 Goal Status: Active Interventions: Assess patient/caregiver ability to perform ulcer/skin care regimen upon admission and as needed Assess ulceration(s) every visit Provide education on ulcer and skin care Treatment Activities: Skin care regimen initiated : 01/22/2023 Topical wound management initiated : 01/22/2023 Notes: Electronic Signature(s) Signed: 02/03/2023 4:50:49 PM By: Deon Pilling RN, BSN Entered By: Deon Pilling on 02/02/2023 10:35:17 -------------------------------------------------------------------------------- Pain Assessment Details Patient Name: Date of Service: [place], [name] 02/02/2023 10:15 A M Medical Record Number: [id number] Patient Account Number: [id number] Date of Birth/Sex: Treating RN: 1946-09-23 (77 y.o. [name] Primary Care Muhanad Torosyan: [name], [name] Other Clinician: Referring Rabiah Goeser: Treating Evelene Roussin/Extender: [name], [name], [name] in Treatment: 6 Active Problems Location of Pain Severity and Description of Pain Patient Has Paino No Site Locations Rate the pain. IZAI, NADEN ([id number]) [id number] Page 5 of 8 Rate the pain. Current Pain Level: 0 Pain Management  and Medication Current Pain Management: Medication: No Cold Application: No Rest: No Massage: No Activity: No T.E.N.S.: No Heat Application: No Leg drop or elevation: No Is the Current Pain Management Adequate: Adequate How does your wound impact your activities of daily livingo Sleep: No Bathing: No Appetite: No Relationship With Others: No Bladder Continence: No Emotions: No Bowel Continence: No Work: No Toileting: No Drive: No Dressing: No Hobbies:  No Engineer, maintenance) Signed: 02/03/2023 4:50:49 PM By: Deon Pilling RN, BSN Entered By: Deon Pilling on 02/02/2023 10:32:53 -------------------------------------------------------------------------------- Patient/Caregiver Education Details Patient Name: Date of Service: [name] 2/19/2024andnbsp10:15 [place] Record Number: [id number] Patient Account Number: [id number] Date of Birth/Gender: Treating RN: [birth year] (77 y.o. [name] Primary Care Physician: [name], [name] Other Clinician: Referring Physician: Treating Physician/Extender: [name], [name], [name] in Treatment: 6 Education Assessment Education Provided To: Patient Education Topics Provided Wound/Skin Impairment: Methods: Explain/Verbal Responses: State content correctly Electronic Signature(s) Signed: 02/03/2023 4:50:49 PM By: Deon Pilling RN, BSN Entered By: Deon Pilling on 02/02/2023 10:35:41 [name] ([id number] Page 6 of 8 -------------------------------------------------------------------------------- Wound Assessment Details Patient Name: Date of Service: [name] 02/02/2023 10:15 A M Medical Record Number: [id number] Patient Account Number: [id number] Date of Birth/Sex: Treating RN: 04/27/1946 (77 y.o. [name] Primary Care Chazlyn Cude: [name], [name] Other Clinician: Referring Azaan Leask: Treating Emmer Lillibridge/Extender: [name], [name], [name] in Treatment: 6 Wound Status Wound Number: 1 Primary Diabetic Wound/Ulcer of the Lower Extremity Etiology: Wound Location: Right Metatarsal head fifth Wound Open Wounding Event: Gradually Appeared Status: Date Acquired: 01/22/2022 Comorbid Anemia, Sleep Apnea, Arrhythmia, Coronary Artery Disease, Weeks Of Treatment: 1 History: Hypertension, Myocardial Infarction, Type II Diabetes, Clustered Wound: No Osteoarthritis, Neuropathy, Received  Chemotherapy, Received Radiation Photos Wound Measurements Length: (cm) Width: (cm) Depth: (cm) Area: (cm) Volume: (cm) 0.1 % Reduction in Area: 74.2% 0.1 % Reduction in Volume: 88.9% 0.1 Epithelialization: Large (67-100%) 0.008 Tunneling: No 0.001 Undermining: No Wound Description Classification: Grade 1 Wound Margin: Distinct, outline attached Exudate Amount: Medium Exudate Type: Serosanguineous Exudate Color: red, brown Foul Odor After Cleansing: No Slough/Fibrino No Wound Bed Granulation Amount: Large (67-100%) Exposed Structure Granulation Quality: Red, Pink, Pale Fascia Exposed: No Necrotic Amount: None Present (0%) Fat Layer (Subcutaneous Tissue) Exposed: Yes Tendon Exposed: No Muscle Exposed: No Joint Exposed: No Bone Exposed: No Periwound Skin Texture Texture Color No Abnormalities Noted: No No Abnormalities Noted: No Callus: Yes Atrophie Blanche: No Crepitus: No Cyanosis: No Excoriation: No Ecchymosis: No Induration: No Erythema: No Rash: No Hemosiderin Staining: No Scarring: No Mottled: No Pallor: No Moisture Rubor: No No Abnormalities Noted: No Dry / Scaly: Yes Maceration: No Amble, Nakia L ([id number].pdf Page 7 of 8 Treatment Notes Wound #1 (Metatarsal head fifth) Wound Laterality: Right Cleanser Wound Cleanser Discharge Instruction: Cleanse the wound with wound cleanser prior to applying a clean dressing using gauze sponges, not tissue or cotton balls. Peri-Wound Care Skin Prep Discharge Instruction: Use skin prep as directed Topical Gentamicin Discharge Instruction: As directed by physician Mupirocin Ointment Discharge Instruction: Apply Mupirocin (Bactroban) as instructed Primary Dressing Endoform 2x2 in Discharge Instruction: Moisten with saline Secondary Dressing Optifoam Non-Adhesive Dressing, 4x4 in Discharge Instruction: foam donut Woven Gauze Sponges 2x2 in Discharge Instruction:  Apply over primary dressing as directed. Secured With Conforming Stretch Gauze Bandage, Sterile 2x75 (in/in) Discharge Instruction: Secure with stretch gauze as directed. Compression Wrap Compression Stockings Add-Ons Electronic Signature(s) Signed: 02/02/2023 4:17:27 PM By: Sharyn Creamer RN, BSN  Signed: 02/03/2023 4:50:49 PM By: Deon Pilling RN, BSN Entered By: Sharyn Creamer on 02/02/2023 10:39:23 -------------------------------------------------------------------------------- Vitals Details Patient Name: Date of Service: [place], [name] 02/02/2023 10:15 A M Medical Record Number: [id number] Patient Account Number: [id number] Date of Birth/Sex: Treating RN: 1946-01-06 (77 y.o. [name] Primary Care Roneka Gilpin: [name], [name] Other Clinician: Referring Damin Salido: Treating Foster Sonnier/Extender: [name], [name], [name] in Treatment: 6 Vital Signs Time Taken: 01:25 Reference Range: 80 - 120 mg / dl Height (in): 71 Weight (lbs): 252 Body Mass Index (BMI): 35.1 Notes see post VS from HBO. Electronic Signature(s) Signed: 02/03/2023 4:50:49 PM By: Deon Pilling RN, BSN [name] ([id number]) [id number] Page 8 of 8 Entered By: Deon Pilling on 02/02/2023 10:32:45

## 2023-02-05 ENCOUNTER — Encounter (HOSPITAL_BASED_OUTPATIENT_CLINIC_OR_DEPARTMENT_OTHER): Payer: Medicare Other | Admitting: Internal Medicine

## 2023-02-05 DIAGNOSIS — E1140 Type 2 diabetes mellitus with diabetic neuropathy, unspecified: Secondary | ICD-10-CM | POA: Diagnosis not present

## 2023-02-05 DIAGNOSIS — E11621 Type 2 diabetes mellitus with foot ulcer: Secondary | ICD-10-CM

## 2023-02-05 DIAGNOSIS — C61 Malignant neoplasm of prostate: Secondary | ICD-10-CM

## 2023-02-05 DIAGNOSIS — N3041 Irradiation cystitis with hematuria: Secondary | ICD-10-CM | POA: Diagnosis not present

## 2023-02-05 DIAGNOSIS — L97512 Non-pressure chronic ulcer of other part of right foot with fat layer exposed: Secondary | ICD-10-CM | POA: Diagnosis not present

## 2023-02-05 DIAGNOSIS — I129 Hypertensive chronic kidney disease with stage 1 through stage 4 chronic kidney disease, or unspecified chronic kidney disease: Secondary | ICD-10-CM | POA: Diagnosis not present

## 2023-02-05 DIAGNOSIS — E785 Hyperlipidemia, unspecified: Secondary | ICD-10-CM | POA: Diagnosis not present

## 2023-02-05 DIAGNOSIS — E1122 Type 2 diabetes mellitus with diabetic chronic kidney disease: Secondary | ICD-10-CM | POA: Diagnosis not present

## 2023-02-05 LAB — GLUCOSE, CAPILLARY
Glucose-Capillary: 200 mg/dL — ABNORMAL HIGH (ref 70–99)
Glucose-Capillary: 201 mg/dL — ABNORMAL HIGH (ref 70–99)

## 2023-02-05 NOTE — Progress Notes (Signed)
NECALLI, STOUP ([id number]) [id number] Page 1 of 1 Visit Report for 02/04/2023 [name] Details Patient Name: Date of Service: [place]. 02/04/2023 Medical Record Number: [id number] Patient Account Number: [id number] Date of Birth/Sex: Treating RN: [birth year] (77 y.o. 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 C61 Malignant neoplasm of prostate E11.621 Type 2 diabetes mellitus with foot ulcer L97.512 Non-pressure chronic ulcer of other part of right foot with fat layer exposed Facility Procedures CPT4 Code Description Modifier Quantity [id number]-(Facility Use Only) HBOT full body chamber, 61mn , 4 ICD-10 Diagnosis Description N30.41 Irradiation cystitis with hematuria 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

## 2023-02-05 NOTE — Progress Notes (Signed)
DEKLAN, ROZELL ([id number].pdf Page 1 of 2 Visit Report for 02/04/2023 Arrival Information Details Patient Name: Date of Service: [name] 02/04/2023 8:00 A M Medical Record Number: [id number] Patient Account Number: [id number] Date of Birth/Sex: Treating RN: 06/14/1946 (78 y.o. [name] Primary Care Jaylenne Hamelin: [name], [name] Other Clinician: [name] Referring Verlon Pischke: Treating Tanda Morrissey/Extender: [name], [name] [name], [name] in Treatment: 6 Visit Information History Since Last Visit All ordered tests and consults were completed: Yes Patient Arrived: [name] Added or deleted any medications: No Arrival Time: 07:20 Any new allergies or adverse reactions: No Accompanied By: self Had a fall or experienced change in No Transfer Assistance: None activities of daily living that may affect Patient Identification Verified: Yes risk of falls: Secondary Verification Process Completed: Yes Signs or symptoms of abuse/neglect since last visito No Patient Requires Transmission-Based No Hospitalized since last visit: No Precautions: Implantable device outside of the clinic excluding No Patient Has Alerts: Yes cellular tissue based products placed in the center Patient Alerts: Patient on Blood Thinner since last visit: LTBI: 0.88 09/25/2021 Pain Present Now: No VVS Electronic Signature(s) Signed: 02/04/2023 12:46:30 PM By: [name] , , Entered By: [name] on 02/04/2023 12:46:29 -------------------------------------------------------------------------------- Encounter Discharge Information Details Patient Name: Date of Service: [name], [name] 02/04/2023 8:00 A M Medical Record Number: [id number] Patient Account Number: [id number] Date of Birth/Sex: Treating RN: June 14, 1946 (77 y.o. [name] Primary Care Kamillah Didonato: [name], [name] Other Clinician: [name] Referring Jerremy Maione: Treating Rosea Dory/Extender: [name], [name] [name], [name] in Treatment: 6 Encounter Discharge Information Items Discharge Condition: Stable Ambulatory Status: Walker Discharge Destination: Home Transportation: Private Auto Accompanied By: self Schedule Follow-up Appointment: No Clinical Summary of Care: Electronic Signature(s) Signed: 02/04/2023 12:54:33 PM By: [name] , , Entered By: [name] on 02/04/2023 12:54:33 Charna Busman, [name] ([id number].pdf Page 2 of 2 -------------------------------------------------------------------------------- Vitals Details Patient Name: Date of Service: [name] 02/04/2023 8:00 A M Medical Record Number: [id number] Patient Account Number: [id number] Date of Birth/Sex: Treating RN: Apr 08, 1946 (77 y.o. [name] Primary Care Serafino Burciaga: [name], [name] Other Clinician: [name] Referring Taya Ashbaugh: Treating Jennene Downie/Extender: [name], [name] [name], [name] in Treatment: 6 Vital Signs Time Taken: 07:42 Temperature (F): 97.6 Height (in): 71 Pulse (bpm): 83 Weight (lbs): 252 Respiratory Rate (breaths/min): 18 Body Mass Index (BMI): 35.1 Blood Pressure (mmHg): 138/68 Capillary Blood Glucose (mg/dl): 244 Reference Range: 80 - 120 mg / dl Electronic Signature(s) Signed: 02/04/2023 12:46:58 PM By: [name] , , Entered By: [name] on 02/04/2023 12:46:58

## 2023-02-05 NOTE — Progress Notes (Addendum)
IHSAAN, DANEHY ([id number]) [phone].pdf Page 1 of 2 Visit Report for 02/04/2023 HBO Details Patient Name: Date of Service: [name] 02/04/2023 8:00 A M Medical Record Number: [id number] Patient Account Number: [id number] Date of Birth/Sex: Treating RN: October 22, 1946 (77 y.o. [name] Primary Care Adaijah Endres: [name], [name] Other Clinician: [name] Referring Madeliene Tejera: Treating Shakelia Scrivner/Extender: [name], [name] [name], [name] in Treatment: 6 HBO Treatment Course Details Treatment Course Number: 1 Ordering Halah Whiteside: [name] T Treatments Ordered: otal 40 HBO Treatment Start Date: 01/05/2023 HBO Indication: Late Effect of Radiation HBO Treatment Details Treatment Number: 22 Patient Type: Outpatient Chamber Type: Monoplace Chamber Serial #: [id number] Treatment Protocol: 2.5 ATA with 90 minutes oxygen, with two 5 minute air breaks Treatment Details Compression Rate Down: 1.5 psi / minute De-Compression Rate Up: 2.0 psi / minute A breaks and breathing ir Compress Tx Pressure periods Decompress Decompress Begins Reached (leave unused spaces Begins Ends blank) Chamber Pressure (ATA 1 2.5 2.5 2.5 2.5 2.5 - - 2.5 1 ) Clock Time (24 hr) 07:52 08:05 08:35 08:40 09:10 09:15 - - 09:45 09:57 Treatment Length: 125 (minutes) Treatment Segments: 4 Vital Signs Capillary Blood Glucose Reference Range: 80 - 120 mg / dl HBO Diabetic Blood Glucose Intervention Range: <131 mg/dl or >249 mg/dl Type: Time Vitals Blood Respiratory Capillary Blood Glucose Pulse Action Pulse: Temperature: Taken: Pressure: Rate: Glucose (mg/dl): Meter #: Oximetry (%) Taken: Pre 07:42 138/68 83 18 97.6 244 1 none per protocol Post 10:00 163/93 83 18 98.2 282 1 none per protocol Treatment Response Treatment Toleration: Well Treatment Completion Status: Treatment Completed without Adverse Event Treatment Notes Patient arrived, dressed for treatment.  Blood glucose measured 244 mg/dL. Patient self-administered Afrin today. After safety check, patient was placed in the chamber and chamber was compressed at 1 psi/min until confirmation of normal ear equalization and then rate set was increased to 2 psi/min. Patient tolerated treatment and decompression of the chamber at 2 psi/min. Post-treatment blood glucose was greater than 249 mg/dL. Patient denied any symptoms of hyperglycemia and stated he felt fine. Patient was stable upon discharge. Electronic Signature(s) Signed: 02/04/2023 12:53:39 PM By: [name] , , Signed: 02/04/2023 5:24:38 PM By: Worthy Keeler PA-C Entered By: [name] on 02/04/2023 12:53:39 HBO Safety Checklist Details -------------------------------------------------------------------------------- [name] ([id number].pdf Page 2 of 2 Patient Name: Date of Service: [name] 02/04/2023 8:00 A M Medical Record Number: [id number] Patient Account Number: [id number] Date of Birth/Sex: Treating RN: 1946/06/29 (77 y.o. [name] Primary Care Warren Lindahl: [name], [name] Other Clinician: [name] Referring Dillion Stowers: Treating Lavante Toso/Extender: [name], [name] [name], [name] in Treatment: 6 HBO Safety Checklist Items Safety Checklist Consent Form Signed Patient voided / foley secured and emptied When did you last eato 0630 Last dose of injectable or oral agent 0640 Ostomy pouch emptied and vented if applicable NA All implantable devices assessed, documented and approved Bard PowerPort ClearVUE Intravenous access site secured and place NA Valuables secured Linens and cotton and cotton/polyester blend (less than 51% polyester) Personal oil-based products / skin lotions / body lotions removed NA Wigs or hairpieces removed NA Smoking or tobacco materials removed Books / newspapers / magazines / loose paper  removed Cologne, aftershave, perfume and deodorant removed Jewelry removed (may wrap wedding band) Make-up removed NA Hair care products removed Battery operated devices (external) removed Heating patches and chemical warmers removed Titanium eyewear removed Nail polish cured greater than 10  hours NA Casting material cured greater than 10 hours Cast applied more than 10 hrs ago Hearing aids removed NA Loose dentures or partials removed NA Prosthetics have been removed NA Patient demonstrates correct use of air break device (if applicable) Patient concerns have been addressed Patient grounding bracelet on and cord attached to chamber Specifics for Inpatients (complete in addition to above) Medication sheet sent with patient NA Intravenous medications needed or due during therapy sent with patient NA Drainage tubes (e.g. nasogastric tube or chest tube secured and vented) NA Endotracheal or Tracheotomy tube secured NA Cuff deflated of air and inflated with saline NA Airway suctioned NA Notes Paper version used prior to treatment start. Electronic Signature(s) Signed: 02/04/2023 12:49:25 PM By: [name] , , Entered By: [name] on 02/04/2023 12:49:24

## 2023-02-06 ENCOUNTER — Encounter (HOSPITAL_BASED_OUTPATIENT_CLINIC_OR_DEPARTMENT_OTHER): Payer: Medicare Other | Admitting: Internal Medicine

## 2023-02-06 DIAGNOSIS — E11621 Type 2 diabetes mellitus with foot ulcer: Secondary | ICD-10-CM

## 2023-02-06 DIAGNOSIS — E1122 Type 2 diabetes mellitus with diabetic chronic kidney disease: Secondary | ICD-10-CM | POA: Diagnosis not present

## 2023-02-06 DIAGNOSIS — N3041 Irradiation cystitis with hematuria: Secondary | ICD-10-CM

## 2023-02-06 DIAGNOSIS — I129 Hypertensive chronic kidney disease with stage 1 through stage 4 chronic kidney disease, or unspecified chronic kidney disease: Secondary | ICD-10-CM | POA: Diagnosis not present

## 2023-02-06 DIAGNOSIS — E1140 Type 2 diabetes mellitus with diabetic neuropathy, unspecified: Secondary | ICD-10-CM | POA: Diagnosis not present

## 2023-02-06 DIAGNOSIS — C61 Malignant neoplasm of prostate: Secondary | ICD-10-CM | POA: Diagnosis not present

## 2023-02-06 DIAGNOSIS — L97512 Non-pressure chronic ulcer of other part of right foot with fat layer exposed: Secondary | ICD-10-CM | POA: Diagnosis not present

## 2023-02-06 DIAGNOSIS — E785 Hyperlipidemia, unspecified: Secondary | ICD-10-CM | POA: Diagnosis not present

## 2023-02-06 LAB — GLUCOSE, CAPILLARY
Glucose-Capillary: 217 mg/dL — ABNORMAL HIGH (ref 70–99)
Glucose-Capillary: 218 mg/dL — ABNORMAL HIGH (ref 70–99)

## 2023-02-06 NOTE — Progress Notes (Signed)
[name], [name] ([id number].pdf Page 1 of 2 Visit Report for 02/05/2023 Arrival Information Details Patient Name: Date of Service: [name] 02/05/2023 8:00 [name] Medical Record Number: [id number] Patient Account Number: [id number] Date of Birth/Sex: Treating RN: Apr 30, 1946 (77 y.o. [name] Primary Care Catie Chiao: [name] Other Clinician: [name] Referring Asianna Brundage: Treating Harue Pribble/Extender: [name], Jessica [name] Weeks in Treatment: 6 Visit Information History Since Last Visit All ordered tests and consults were completed: Yes Patient Arrived: [name] Added or deleted any medications: No Arrival Time: 07:30 Any new allergies or adverse reactions: No Accompanied By: self Had Bill fall or experienced change in No Transfer Assistance: None activities of daily living that may affect Patient Identification Verified: Yes risk of falls: Secondary Verification Process Completed: Yes Signs or symptoms of abuse/neglect since last visito No Patient Requires Transmission-Based No Hospitalized since last visit: No Precautions: Implantable device outside of the clinic excluding No Patient Has Alerts: Yes cellular tissue based products placed in the center Patient Alerts: Patient on [name] since last visit: LTBI: 0.88 09/25/2021 Pain Present Now: No VVS Electronic Signature(s) Signed: 02/05/2023 11:08:19 AM By: [name] , , Entered By: [name] on 02/05/2023 11:08:19 -------------------------------------------------------------------------------- Encounter Discharge Information Details Patient Name: Date of Service: [name], [name] 02/05/2023 8:00 [name] Medical Record Number: [id number] Patient Account Number: [id number] Date of Birth/Sex: Treating RN: 05-17-1946 (77 y.o. [name] Primary Care Jenner Rosier: [name] Other Clinician: [name] Referring Garvis Downum: Treating Nekia Maxham/Extender: [name], Jessica [name] Weeks in Treatment: 6 Encounter Discharge Information Items Discharge Condition: Stable Ambulatory Status: Walker Discharge Destination: Home Transportation: Private Auto Accompanied By: self Schedule Follow-up Appointment: No Clinical Summary of Care: Electronic Signature(s) Signed: 02/05/2023 11:36:08 AM By: [name] , , Entered By: [name] on 02/05/2023 11:36:07 [name] ([id number].pdf Page 2 of 2 -------------------------------------------------------------------------------- Vitals Details Patient Name: Date of Service: [name] 02/05/2023 8:00 [name] Medical Record Number: [id number] Patient Account Number: [id number] Date of Birth/Sex: Treating RN: 1946/06/14 (77 y.o. [name] Primary Care Joshlynn Alfonzo: [name] Other Clinician: [name] Referring Maricruz Lucero: Treating Judyann Casasola/Extender: [name], Jessica [name] Weeks in Treatment: 6 Vital Signs Time Taken: 07:39 Temperature (F): 98.2 Height (in): 71 Pulse (bpm): 81 Weight (lbs): 252 Respiratory Rate (breaths/min): 18 Body Mass Index (BMI): 35.1 [name]: 136/59 Capillary [name]: 200 Reference Range: 80 - 120 mg / dl Electronic Signature(s) Signed: 02/05/2023 11:31:33 AM By: [name] , , Entered By: [name] on 02/05/2023 11:31:32

## 2023-02-06 NOTE — Progress Notes (Signed)
[name] ([id number] Page 1 of 2 Visit Report for 02/05/2023 HBO Details Patient Name: Date of Service: [name] 02/05/2023 8:00 A M Medical Record Number: [id number] Patient Account Number: [id number] Date of Birth/Sex: Treating RN: 07-Feb-1946 (77 y.o. [name] Primary Care Srah Ake: [name], [name] Other Clinician: [name] Referring Tresea Heine: Treating Jaqueline Uber/Extender: [name], [name], [name] in Treatment: 6 HBO Treatment Course Details Treatment Course Number: 1 Ordering Anesha Hackert: [name] T Treatments Ordered: otal 40 HBO Treatment Start Date: 01/05/2023 HBO Indication: Late Effect of Radiation HBO Treatment Details Treatment Number: 23 Patient Type: Outpatient Chamber Type: Monoplace Chamber Serial #: [id number] Treatment Protocol: 2.5 ATA with 90 minutes oxygen, with two 5 minute air breaks Treatment Details Compression Rate Down: 1.5 psi / minute De-Compression Rate Up: 2.0 psi / minute A breaks and breathing ir Compress Tx Pressure periods Decompress Decompress Begins Reached (leave unused spaces Begins Ends blank) Chamber Pressure (ATA 1 2.5 2.5 2.5 2.5 2.5 - - 2.5 1 ) Clock Time (24 hr) 07:48 08:05 08:35 08:40 09:10 09:15 - - 09:45 09:55 Treatment Length: 127 (minutes) Treatment Segments: 4 Vital Signs Capillary [name] Reference Range: 80 - 120 mg / dl HBO Diabetic [name] Intervention Range: <131 mg/dl or >249 mg/dl Type: Time Vitals [name] Pulse: Respiratory Temperature: Capillary [name] Pulse Action Taken: Pressure: Rate: Glucose (mg/dl): Meter #: Oximetry (%) Taken: Pre 07:39 136/59 81 18 98.2 200 1 asymptomatic for hypotension Post 10:01 145/69 89 18 98 201 1 none per protocol Treatment Response Treatment Toleration: Well Treatment Completion Status: Treatment Completed without Adverse Event Treatment Notes Patient arrived, dressed for  treatment. [name] measured 200 mg/dL. Patient's diastolic pressure was 59 and patient denied any symptoms of hypotension. After safety check, patient was placed in the chamber and chamber was compressed at 1 psi/min until confirmation of normal ear equalization and then rate set was increased to 2 psi/min. Patient tolerated treatment and decompression of the chamber at 2 psi/min. Post-treatment [name] was greater than 201 mg/dL. Patient denied any symptoms of hyperglycemia and stated he felt fine. Patient was stable upon discharge. Electronic Signature(s) Signed: 02/05/2023 11:35:24 AM By: [name] , , Entered By: [name] on 02/05/2023 11:35:24 -------------------------------------------------------------------------------- HBO Safety Checklist Details Patient Name: Date of Service: Bill [place], [name] 02/05/2023 8:00 A [name] ([id number]) [phone].pdf Page 2 of 2 Medical Record Number: [id number] Patient Account Number: [id number] Date of Birth/Sex: Treating RN: [birth year] (77 y.o. [name] Primary Care Santresa Levett: [name], [name] Other Clinician: [name] Referring Juanelle Trueheart: Treating Briya Lookabaugh/Extender: [name], [name], [name] in Treatment: 6 HBO Safety Checklist Items Safety Checklist Consent Form Signed Patient voided / foley secured and emptied When did you last eato 0630 Last dose of injectable or oral agent 0640 Ostomy pouch emptied and vented if applicable NA All implantable devices assessed, documented and approved Bard PowerPort ClearVUE Intravenous access site secured and place NA Valuables secured Linens and cotton and cotton/polyester blend (less than 51% polyester) Personal oil-based products / skin lotions / body lotions removed Wigs or hairpieces removed NA Smoking or tobacco materials removed NA Books / newspapers / magazines / loose paper removed Cologne,  aftershave, perfume and deodorant removed Jewelry removed (may wrap wedding band) Make-up removed NA Hair care products removed Battery operated devices (external) removed Heating patches and chemical warmers removed Titanium eyewear removed Nail polish cured greater than 10 hours NA Casting  material cured greater than 10 hours Cast applied more than 10 hrs ago Hearing aids removed NA Loose dentures or partials removed NA Prosthetics have been removed NA Patient demonstrates correct use of air break device (if applicable) Patient concerns have been addressed Patient grounding bracelet on and cord attached to chamber Specifics for Inpatients (complete in addition to above) Medication sheet sent with patient NA Intravenous medications needed or due during therapy sent with patient NA Drainage tubes (e.g. nasogastric tube or chest tube secured and vented) NA Endotracheal or Tracheotomy tube secured NA Cuff deflated of air and inflated with saline NA Airway suctioned NA Notes Paper version used prior to treatment. Electronic Signature(s) Signed: 02/05/2023 11:33:08 AM By: [name] , , Entered By: [name] on 02/05/2023 11:33:08

## 2023-02-07 NOTE — Progress Notes (Signed)
NASEEM, STEPHAN ([id number].pdf Page 1 of 1 Visit Report for 02/05/2023 [name] Details Patient Name: Date of Service: [place]. 02/05/2023 Medical Record Number: [id number] Patient Account Number: [id number] Date of Birth/Sex: Treating RN: [birth year] (77 y.o. 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 C61 Malignant neoplasm of prostate E11.621 Type 2 diabetes mellitus with foot ulcer L97.512 Non-pressure chronic ulcer of other part of right foot with fat layer exposed Facility Procedures CPT4 Code Description Modifier Quantity [id number] G0277-(Facility Use Only) HBOT full body chamber, 44mn , 4 ICD-10 Diagnosis Description 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

## 2023-02-07 NOTE — Progress Notes (Signed)
KELSO, SCHNACK ([id number].pdf Page 1 of 2 Visit Report for 02/06/2023 Arrival Information Details Patient Name: Date of Service: Bill Bill 02/06/2023 8:00 Bill M Medical Record Number: [id number] Patient Account Number: [id number] Date of Birth/Sex: Treating RN: 11/04/1946 (76 y.o. Bill Bill, [name] Other Clinician: [name] Referring Bill Bill: Treating Bill Bill/Extender: [name], Jessica Bill [name]: 6 Visit Information History Since Last Visit All ordered tests and consults were completed: Yes Patient Arrived: Bill Bill Added or deleted any medications: No Arrival Time: 07:15 Any new allergies or adverse reactions: No Accompanied By: self Had Bill fall or experienced change in No Transfer Assistance: None activities of daily living that may affect Patient Identification Verified: Yes risk of falls: Secondary Verification Process Completed: Yes Signs or symptoms of abuse/neglect since last visito No Patient Requires Transmission-Based No Hospitalized since last visit: No Precautions: Implantable device outside of the clinic excluding No Patient Has Alerts: Yes cellular tissue based products placed in the center Patient Alerts: Patient on Bill Thinner since last visit: LTBI: 0.88 09/25/2021 Pain Present Now: No VVS Electronic Signature(s) Signed: 02/06/2023 10:50:21 AM By: [name] , , Entered By: [name] on 02/06/2023 10:50:20 -------------------------------------------------------------------------------- Encounter Discharge Information Details Patient Name: Date of Service: Bill Bill, [name] 02/06/2023 8:00 Bill M Medical Record Number: [id number] Patient Account Number: [id number] Date of Birth/Sex: Treating RN: [birth year] (77 y.o. Bill Session Primary Care Krisalyn Yankowski: Bill Bill Other Clinician: [name] Referring Antaeus Karel: Treating Bill Bill/Extender: [name], Jessica Bill [name]: 6 Encounter Discharge Information Items Discharge Condition: Stable Ambulatory Status: Walker Discharge Destination: Home Transportation: Private Auto Accompanied By: self Schedule Follow-up Appointment: No Clinical Summary of Care: Electronic Signature(s) Signed: 02/06/2023 12:43:02 PM By: [name] , , Entered By: [name] on 02/06/2023 12:43:02 Bill Bill ([id number].pdf Page 2 of 2 -------------------------------------------------------------------------------- Vitals Details Patient Name: Date of Service: Bill Bill 02/06/2023 8:00 Bill M Medical Record Number: [id number] Patient Account Number: [id number] Date of Birth/Sex: Treating RN: 05-02-1946 (77 y.o. Bill Session Primary Care Shantika Bermea: Bill Bill Other Clinician: [name] Referring Tanasia Budzinski: Treating Bill Bill/Extender: [name], Jessica Bill [name]: 6 Vital Signs Time Taken: 07:41 Temperature (F): 98.1 Height (in): 71 Pulse (bpm): 83 Weight (lbs): 252 Respiratory Rate (breaths/min): 18 Body Mass Index (BMI): 35.1 Bill Bill (mmHg): 153/75 Capillary Bill Glucose (mg/dl): 217 Reference Range: 80 - 120 mg / dl Electronic Signature(s) Signed: 02/06/2023 10:50:45 AM By: [name] , , Entered By: [name] on 02/06/2023 10:50:45

## 2023-02-07 NOTE — Progress Notes (Signed)
[name], [name] ([id number]) [id number] Page 1 of 1 Visit Report for 02/06/2023 [name] Details Patient Name: Date of Service: [place]. 02/06/2023 Medical Record Number: [id number] Patient Account Number: [id number] Date of Birth/Sex: Treating RN: Dec 22, 1945 (77 y.o. 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 C61 Malignant neoplasm of prostate E11.621 Type 2 diabetes mellitus with foot ulcer L97.512 Non-pressure chronic ulcer of other part of right foot with fat layer exposed Facility Procedures CPT4 Code Description Modifier Quantity [id number] G0277-(Facility Use Only) HBOT full body chamber, 37mn , 4 ICD-10 Diagnosis Description N30.41 Irradiation cystitis with hematuria C61 Malignant neoplasm of prostate E11.621 Type 2 diabetes mellitus with foot ulcer Physician Procedures Quantity CPT4 Code Description Modifier [id number] PHYS HYPERBARIC OXYGEN THERAPY 1 ICD-10 Diagnosis Description N30.41 Irradiation cystitis with hematuria C61 Malignant neoplasm of prostate E11.621 Type 2 diabetes mellitus with foot ulcer Electronic Signature(s) Signed: 02/06/2023 10:58:54 AM By: SDonavan BurnetCHT EMT BS , , Signed: 02/06/2023 12:04:24 PM By: HKalman ShanDO Entered By: SDonavan Burneton 02/06/2023 10:58:53

## 2023-02-07 NOTE — Progress Notes (Addendum)
LOT, WILES ([id number] Page 1 of 2 Visit Report for 02/06/2023 HBO Details Patient Name: Date of Service: [name] 02/06/2023 8:00 A M Medical Record Number: [id number] Patient Account Number: [id number] Date of Birth/Sex: Treating RN: 1946/06/13 (77 y.o. [name] Session Primary Care Mikya Don: [name], [name] Other Clinician: [name] Referring Quinta Eimer: Treating Kinlee Garrison/Extender: [name], [name], [name] in Treatment: 6 HBO Treatment Course Details Treatment Course Number: 1 Ordering Christofer Shen: [name] T Treatments Ordered: otal 40 HBO Treatment Start Date: 01/05/2023 HBO Indication: Late Effect of Radiation HBO Treatment Details Treatment Number: 24 Patient Type: Outpatient Chamber Type: Monoplace Chamber Serial #: [id number] Treatment Protocol: 2.5 ATA with 90 minutes oxygen, with two 5 minute air breaks Treatment Details Compression Rate Down: 1.5 psi / minute De-Compression Rate Up: 2.0 psi / minute A breaks and breathing ir Compress Tx Pressure periods Decompress Decompress Begins Reached (leave unused spaces Begins Ends blank) Chamber Pressure (ATA 1 2.5 2.5 2.5 2.5 2.5 - - 2.5 1 ) Clock Time (24 hr) 07:47 08:06 08:36 08:41 09:11 09:16 - - 09:46 09:58 Treatment Length: 131 (minutes) Treatment Segments: 4 Vital Signs Capillary Blood Glucose Reference Range: 80 - 120 mg / dl HBO Diabetic Blood Glucose Intervention Range: <131 mg/dl or >249 mg/dl Type: Time Vitals Blood Respiratory Capillary Blood Glucose Pulse Action Pulse: Temperature: Taken: Pressure: Rate: Glucose (mg/dl): Meter #: Oximetry (%) Taken: Pre 07:41 153/75 83 18 98.1 217 1 none per protocol Post 10:02 128/75 80 18 98.9 218 1 none per protocol Treatment Response Treatment Toleration: Well Treatment Completion Status: Treatment Completed without Adverse Event Treatment Notes Patient arrived, dressed for treatment.  Blood glucose measured at 217 mg/dL. After safety check, patient was placed in the chamber and chamber was compressed at 1 psi/min until confirmation of normal ear equalization and then rate set was increased to 2 psi/min. Patient tolerated treatment and decompression of the chamber at 2 psi/min. Post-treatment blood glucose was 218 mg/dL. Patient was stable upon discharge. Physician HBO Attestation: I certify that I supervised this HBO treatment in accordance with Medicare guidelines. A trained emergency response team is readily available per Yes hospital policies and procedures. Continue HBOT as ordered. Yes Electronic Signature(s) Signed: 02/10/2023 3:46:53 PM By: [name] DO Previous Signature: 02/06/2023 10:58:13 AM Version By: [name] , , Previous Signature: 02/06/2023 12:04:24 PM Version By: [name] DO Entered By: [name] on 02/10/2023 13:26:51 -------------------------------------------------------------------------------- HBO Safety Checklist Details Patient Name: Date of Service: [name], [name] 02/06/2023 8:00 A M Medical Record Number: [id number] Patient Account Number: [id number] Date of Birth/Sex: Treating RN: 08-Dec-1946 (77 y.o. [name] Session Primary Care [name]: [name], [name] Other Clinician: [name], [name], [name] [id number] Page 2 of 2 Referring [name]: Treating [name]/Extender: [name], [name], [name] in Treatment: 6 HBO Safety Checklist Items Safety Checklist Consent Form Signed Patient voided / foley secured and emptied When did you last eato 0630 Last dose of injectable or oral agent 0640 Ostomy pouch emptied and vented if applicable NA All implantable devices assessed, documented and approved NA Intravenous access site secured and place NA Valuables secured Linens and cotton and cotton/polyester blend (less than 51%  polyester) Personal oil-based products / skin lotions / body lotions removed Wigs or hairpieces removed NA Smoking or tobacco materials removed NA Books / newspapers / magazines / loose paper removed Cologne, aftershave, perfume and deodorant removed Jewelry removed (may wrap wedding band) Make-up  removed Hair care products removed Battery operated devices (external) removed Heating patches and chemical warmers removed Titanium eyewear removed Nail polish cured greater than 10 hours NA Casting material cured greater than 10 hours Cast applied more than 10 hrs ago Hearing aids removed NA Loose dentures or partials removed NA Prosthetics have been removed NA Patient demonstrates correct use of air break device (if applicable) Patient concerns have been addressed Patient grounding bracelet on and cord attached to chamber Specifics for Inpatients (complete in addition to above) Medication sheet sent with patient NA Intravenous medications needed or due during therapy sent with patient NA Drainage tubes (e.g. nasogastric tube or chest tube secured and vented) NA Endotracheal or Tracheotomy tube secured NA Cuff deflated of air and inflated with saline NA Airway suctioned NA Notes Paper version used prior to treatment. Electronic Signature(s) Signed: 02/06/2023 10:53:57 AM By: [name] , , Entered By: [name] on 02/06/2023 10:53:57

## 2023-02-09 ENCOUNTER — Encounter (HOSPITAL_BASED_OUTPATIENT_CLINIC_OR_DEPARTMENT_OTHER): Payer: Medicare Other | Admitting: General Surgery

## 2023-02-09 DIAGNOSIS — I129 Hypertensive chronic kidney disease with stage 1 through stage 4 chronic kidney disease, or unspecified chronic kidney disease: Secondary | ICD-10-CM | POA: Diagnosis not present

## 2023-02-09 DIAGNOSIS — E11621 Type 2 diabetes mellitus with foot ulcer: Secondary | ICD-10-CM | POA: Diagnosis not present

## 2023-02-09 DIAGNOSIS — L97512 Non-pressure chronic ulcer of other part of right foot with fat layer exposed: Secondary | ICD-10-CM | POA: Diagnosis not present

## 2023-02-09 DIAGNOSIS — L598 Other specified disorders of the skin and subcutaneous tissue related to radiation: Secondary | ICD-10-CM | POA: Diagnosis not present

## 2023-02-09 DIAGNOSIS — E1140 Type 2 diabetes mellitus with diabetic neuropathy, unspecified: Secondary | ICD-10-CM | POA: Diagnosis not present

## 2023-02-09 DIAGNOSIS — E785 Hyperlipidemia, unspecified: Secondary | ICD-10-CM | POA: Diagnosis not present

## 2023-02-09 DIAGNOSIS — E1122 Type 2 diabetes mellitus with diabetic chronic kidney disease: Secondary | ICD-10-CM | POA: Diagnosis not present

## 2023-02-09 LAB — GLUCOSE, CAPILLARY
Glucose-Capillary: 221 mg/dL — ABNORMAL HIGH (ref 70–99)
Glucose-Capillary: 214 mg/dL — ABNORMAL HIGH (ref 70–99)

## 2023-02-09 NOTE — Progress Notes (Signed)
DEIDRICK, FORTES ([id number]) [id number] Page 1 of 2 Visit Report for 02/09/2023 Arrival Information Details Patient Name: Date of Service: [name] 02/09/2023 8:00 [name] Medical Record Number: [id number] Patient Account Number: [id number] Date of Birth/Sex: Treating RN: 02/03/1946 (77 y.o. [name], [name] Primary Care [name]: [name], [name] Other Clinician: Valeria Batman Referring Aniya Jolicoeur: Treating Jeff Frieden/Extender: Cannon, Jennifer [name], [name] in Treatment: 7 Visit Information History Since Last Visit All ordered tests and consults were completed: Yes Patient Arrived: [name] Added or deleted any medications: No Arrival Time: 07:15 Any new allergies or adverse reactions: No Accompanied By: None Had [name] fall or experienced change in No Transfer Assistance: None activities of daily living that may affect Patient Identification Verified: Yes risk of falls: Secondary Verification Process Completed: Yes Signs or symptoms of abuse/neglect since last visito No Patient Requires Transmission-Based No Hospitalized since last visit: No Precautions: Implantable device outside of the clinic excluding No Patient Has Alerts: Yes cellular tissue based products placed in the center Patient Alerts: Patient on [name] since last visit: LTBI: 0.88 09/25/2021 Pain Present Now: No VVS Electronic Signature(s) Signed: 02/09/2023 1:14:55 PM By: Valeria Batman EMT Previous Signature: 02/09/2023 1:09:26 PM Version By: Valeria Batman EMT Entered By: Valeria Batman on 02/09/2023 13:14:55 -------------------------------------------------------------------------------- Encounter Discharge Information Details Patient Name: Date of Service: [name] [name] L. 02/09/2023 8:00 [name] Medical Record Number: [id number] Patient Account Number: [id number] Date of Birth/Sex: Treating RN: 09/15/1946 (77 y.o. [name] Primary Care Kordelia Severin:  [name], [name] Other Clinician: Valeria Batman Referring Viera Okonski: Treating Dashae Wilcher/Extender: Cannon, Jennifer [name], [name] in Treatment: 7 Encounter Discharge Information Items Discharge Condition: Stable Ambulatory Status: Walker Discharge Destination: Home Transportation: Private Auto Accompanied By: None Schedule Follow-up Appointment: Yes Clinical Summary of Care: Electronic Signature(s) Signed: 02/09/2023 1:14:41 PM By: Valeria Batman EMT Entered By: Valeria Batman on 02/09/2023 13:14:40 -------------------------------------------------------------------------------- Vitals Details Patient Name: Date of Service: [name] [name] L. 02/09/2023 8:00 [name] Medical Record Number: [id number] Patient Account Number: [id number] Date of Birth/Sex: Treating RN: [birth year] (77 y.o. [name] Primary Care Jamile Sivils: [name], [name] Other Clinician: Valeria Batman Referring Lavonta Tillis: Treating Joshual Terrio/Extender: Cannon, Jennifer [name], [name] in Treatment: 7 Vital Signs Time Taken: 07:37 Temperature (F): 98.1 [name], [name] ([id number]) [phone].pdf Page 2 of 2 Height (in): 71 Pulse (bpm): 83 Weight (lbs): 252 Respiratory Rate (breaths/min): 20 Body Mass Index (BMI): 35.1 [name]: 148/68 Capillary [name]: 221 Reference Range: 80 - 120 mg / dl Electronic Signature(s) Signed: 02/09/2023 1:10:06 PM By: Valeria Batman EMT Entered By: Valeria Batman on 02/09/2023 13:10:06

## 2023-02-09 NOTE — Progress Notes (Signed)
YEDIDYA, MCCOMMON ([id number]) [id number] Page 1 of 2 Visit Report for 02/09/2023 Problem List Details Patient Name: Date of Service: [name]. 02/09/2023 8:00 A M Medical Record Number: [id number] Patient Account Number: [id number] Date of Birth/Sex: Treating RN: 03/15/1946 (77 y.o. 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 02/09/2023 Medical Record Number: [id number] Patient Account Number: [id number] Date of Birth/Sex: Treating RN: [birth year] (77 y.o. 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 C61 Malignant neoplasm of prostate E11.621 Type 2 diabetes mellitus with foot ulcer L97.512 Non-pressure chronic ulcer of other part of right foot with fat layer exposed Facility Procedures : CPT4 Code: [id number] Description: G0277-(Facility Use Only) HBOT full body chamber, 30mn , ICD-10 Diagnosis Description N30.41 Irradiation cystitis with hematuria C61 Malignant neoplasm of prostate E11.621 Type 2 diabetes mellitus with foot ulcer Modifier: Quantity: 4 Physician Procedures : CPT4 Code Description Modifier [id number] PHYS HYPERBARIC OXYGEN THERAPY ICD-10 Diagnosis Description N30.41 Irradiation cystitis with hematuria C61 Malignant neoplasm of prostate E11.621 Type 2 diabetes mellitus with foot ulcer Quantity: 1 Electronic Signature(s) Signed: 02/09/2023 1:13:57 PM By: GValeria BatmanEMT Signed: 02/09/2023 1:26:09 PM By: CFredirick MaudlinMD FACS Entered By: GValeria Batmanon 02/09/2023 13:13:57

## 2023-02-09 NOTE — Progress Notes (Signed)
[name], [name] ([id number] Page 1 of 2 Visit Report for 02/09/2023 HBO Details Patient Name: Date of Service: [name] 02/09/2023 8:00 [name] M Medical Record Number: [id number] Patient Account Number: [id number] Date of Birth/Sex: Treating RN: [birth year] (77 y.o. [name] Primary Care [name]: [name] Other Clinician: [name] Referring [name]: Treating [name]/Extender: [name] [name] Weeks in Treatment: 7 HBO Treatment Course Details Treatment Course Number: 1 Ordering [name]: [name] T Treatments Ordered: otal 40 HBO Treatment Start Date: 01/05/2023 HBO Indication: Late Effect of Radiation HBO Treatment Details Treatment Number: 25 Patient Type: Outpatient Chamber Type: Monoplace Chamber Serial #: [id number] Treatment Protocol: 2.5 ATA with 90 minutes oxygen, with two 5 minute air breaks Treatment Details Compression Rate Down: 2.0 psi / minute De-Compression Rate Up: 2.0 psi / minute [name] breaks and breathing ir Compress Tx Pressure periods Decompress Decompress Begins Reached (leave unused spaces Begins Ends blank) Chamber Pressure (ATA 1 2.5 2.5 2.5 2.5 2.5 - - 2.5 1 ) Clock Time (24 hr) 07:49 08:02 08:32 08:37 09:07 09:12 - - 09:42 09:51 Treatment Length: 122 (minutes) Treatment Segments: 4 Vital Signs Capillary [name] Reference Range: 80 - 120 mg / dl HBO Diabetic [name] Intervention Range: <131 mg/dl or >249 mg/dl Time Vitals [name] Respiratory Capillary [name] Pulse Action Type: Pulse: Temperature: Taken: Pressure: Rate: Glucose (mg/dl): Meter #: Oximetry (%) Taken: Pre 07:37 148/68 83 20 98.1 221 Post 09:58 169/93 83 20 97.7 214 Treatment Response Treatment Toleration: Well Treatment Completion Status: Treatment Completed without Adverse Event Physician HBO Attestation: I certify that I supervised this HBO treatment in accordance with  Medicare guidelines. [name] trained emergency response team is readily available per Yes hospital policies and procedures. Continue HBOT as ordered. Yes Electronic Signature(s) Signed: 02/09/2023 1:43:33 PM By: [name] Maudlin MD FACS Previous Signature: 02/09/2023 1:13:07 PM Version By: [name] [name] Entered By: [name] on 02/09/2023 13:43:32 -------------------------------------------------------------------------------- HBO Safety Checklist Details Patient Name: Date of Service: [name], [name] 02/09/2023 8:00 [name] M Medical Record Number: [id number] Patient Account Number: [id number] Date of Birth/Sex: Treating RN: [birth year] (77 y.o. [name] Primary Care Bemnet Trovato: [name] Other Clinician: [name] Referring [name]: Treating Niyam Bisping/Extender: [name] [name] Weeks in Treatment: 7 HBO Safety Checklist Items Safety Checklist [name], [name] ([id number]) [id number] Page 2 of 2 Consent Form Signed Patient voided / foley secured and emptied When did you last eato 0630 Last dose of injectable or oral agent 0640 Ostomy pouch emptied and vented if applicable NA All implantable devices assessed, documented and approved NA Intravenous access site secured and place NA Valuables secured Linens and cotton and cotton/polyester blend (less than 51% polyester) Personal oil-based products / skin lotions / body lotions removed Wigs or hairpieces removed NA Smoking or tobacco materials removed Books / newspapers / magazines / loose paper removed Cologne, aftershave, perfume and deodorant removed Jewelry removed (may wrap wedding band) Make-up removed NA Hair care products removed Battery operated devices (external) removed Heating patches and chemical warmers removed Titanium eyewear removed NA Nail polish cured greater than 10 hours NA Casting material cured greater than 10 hours Hearing aids  removed NA Loose dentures or partials removed NA Prosthetics have been removed NA Patient demonstrates correct use of air break device (if applicable) Patient concerns have been addressed Patient grounding bracelet on and cord attached to chamber Specifics for Inpatients (complete in addition to above) Medication sheet sent with patient  NA Intravenous medications needed or due during therapy sent with patient NA Drainage tubes (e.g. nasogastric tube or chest tube secured and vented) NA Endotracheal or Tracheotomy tube secured NA Cuff deflated of air and inflated with saline NA Airway suctioned NA Notes The safety checklist was done before the treatment was started. Electronic Signature(s) Signed: 02/09/2023 1:11:52 PM By: [name] [name] Entered By: [name] on 02/09/2023 13:11:52

## 2023-02-10 ENCOUNTER — Ambulatory Visit
Admission: RE | Admit: 2023-02-10 | Discharge: 2023-02-10 | Disposition: A | Discharge status: Home / Self Care | Payer: Medicare Other | Source: Ambulatory Visit | Attending: Surgery | Admitting: Surgery

## 2023-02-10 ENCOUNTER — Encounter (HOSPITAL_BASED_OUTPATIENT_CLINIC_OR_DEPARTMENT_OTHER): Payer: Medicare Other | Admitting: Internal Medicine

## 2023-02-10 DIAGNOSIS — M4317 Spondylolisthesis, lumbosacral region: Secondary | ICD-10-CM | POA: Diagnosis not present

## 2023-02-10 DIAGNOSIS — C61 Malignant neoplasm of prostate: Secondary | ICD-10-CM

## 2023-02-10 DIAGNOSIS — E785 Hyperlipidemia, unspecified: Secondary | ICD-10-CM | POA: Diagnosis not present

## 2023-02-10 DIAGNOSIS — I129 Hypertensive chronic kidney disease with stage 1 through stage 4 chronic kidney disease, or unspecified chronic kidney disease: Secondary | ICD-10-CM | POA: Diagnosis not present

## 2023-02-10 DIAGNOSIS — N3041 Irradiation cystitis with hematuria: Secondary | ICD-10-CM

## 2023-02-10 DIAGNOSIS — C259 Malignant neoplasm of pancreas, unspecified: Secondary | ICD-10-CM

## 2023-02-10 DIAGNOSIS — E1122 Type 2 diabetes mellitus with diabetic chronic kidney disease: Secondary | ICD-10-CM | POA: Diagnosis not present

## 2023-02-10 DIAGNOSIS — E11621 Type 2 diabetes mellitus with foot ulcer: Secondary | ICD-10-CM | POA: Diagnosis not present

## 2023-02-10 DIAGNOSIS — L97512 Non-pressure chronic ulcer of other part of right foot with fat layer exposed: Secondary | ICD-10-CM | POA: Diagnosis not present

## 2023-02-10 DIAGNOSIS — K55069 Acute infarction of intestine, part and extent unspecified: Secondary | ICD-10-CM | POA: Diagnosis not present

## 2023-02-10 DIAGNOSIS — E1140 Type 2 diabetes mellitus with diabetic neuropathy, unspecified: Secondary | ICD-10-CM | POA: Diagnosis not present

## 2023-02-10 DIAGNOSIS — N400 Enlarged prostate without lower urinary tract symptoms: Secondary | ICD-10-CM | POA: Diagnosis not present

## 2023-02-10 DIAGNOSIS — Z8507 Personal history of malignant neoplasm of pancreas: Secondary | ICD-10-CM | POA: Diagnosis not present

## 2023-02-10 LAB — GLUCOSE, CAPILLARY
Glucose-Capillary: 205 mg/dL — ABNORMAL HIGH (ref 70–99)
Glucose-Capillary: 210 mg/dL — ABNORMAL HIGH (ref 70–99)

## 2023-02-10 MED ORDER — IOPAMIDOL (ISOVUE-300) INJECTION 61%
100.0000 mL | Freq: Once | INTRAVENOUS | Status: AC | PRN
  Administered 2023-02-10: 100 mL via INTRAVENOUS

## 2023-02-10 MED ORDER — SODIUM CHLORIDE 0.9% FLUSH
10.0000 mL | INTRAVENOUS | Status: DC | PRN
  Administered 2023-02-10: 10 mL via INTRAVENOUS

## 2023-02-10 MED ORDER — HEPARIN SOD (PORK) LOCK FLUSH 100 UNIT/ML IV SOLN
500.0000 [IU] | Freq: Once | INTRAVENOUS | Status: AC
  Administered 2023-02-10: 500 [IU] via INTRAVENOUS

## 2023-02-11 ENCOUNTER — Encounter (HOSPITAL_BASED_OUTPATIENT_CLINIC_OR_DEPARTMENT_OTHER): Payer: Medicare Other | Admitting: Physician Assistant

## 2023-02-11 DIAGNOSIS — E785 Hyperlipidemia, unspecified: Secondary | ICD-10-CM | POA: Diagnosis not present

## 2023-02-11 DIAGNOSIS — E11621 Type 2 diabetes mellitus with foot ulcer: Secondary | ICD-10-CM | POA: Diagnosis not present

## 2023-02-11 DIAGNOSIS — E1140 Type 2 diabetes mellitus with diabetic neuropathy, unspecified: Secondary | ICD-10-CM | POA: Diagnosis not present

## 2023-02-11 DIAGNOSIS — L97512 Non-pressure chronic ulcer of other part of right foot with fat layer exposed: Secondary | ICD-10-CM | POA: Diagnosis not present

## 2023-02-11 DIAGNOSIS — E1122 Type 2 diabetes mellitus with diabetic chronic kidney disease: Secondary | ICD-10-CM | POA: Diagnosis not present

## 2023-02-11 DIAGNOSIS — L598 Other specified disorders of the skin and subcutaneous tissue related to radiation: Secondary | ICD-10-CM | POA: Diagnosis not present

## 2023-02-11 DIAGNOSIS — I129 Hypertensive chronic kidney disease with stage 1 through stage 4 chronic kidney disease, or unspecified chronic kidney disease: Secondary | ICD-10-CM | POA: Diagnosis not present

## 2023-02-11 LAB — GLUCOSE, CAPILLARY
Glucose-Capillary: 211 mg/dL — ABNORMAL HIGH (ref 70–99)
Glucose-Capillary: 175 mg/dL — ABNORMAL HIGH (ref 70–99)

## 2023-02-11 NOTE — Progress Notes (Addendum)
Bill, [name] ([id number]) [id number] Page 1 of 2 Visit Report for 02/10/2023 Arrival Information Details Patient Name: Date of Service: [name] 02/10/2023 8:00 Bill M Medical Record Number: [id number] Patient Account Number: [id number] Date of Birth/Sex: Treating RN: 01-29-1946 (77 y.o. [name], [name] Primary Care Alexei Ey: [name], [name] Other Clinician: [name] Referring Obdulia Steier: Treating Serenitee Fuertes/Extender: [name], [name], [name] in Treatment: 7 Visit Information History Since Last Visit All ordered tests and consults were completed: Yes Patient Arrived: [name] Added or deleted any medications: No Arrival Time: 07:15 Any new allergies or adverse reactions: No Accompanied By: self Had Bill fall or experienced change in No Transfer Assistance: None activities of daily living that may affect Patient Identification Verified: Yes risk of falls: Secondary Verification Process Completed: Yes Signs or symptoms of abuse/neglect since last visito No Patient Requires Transmission-Based No Hospitalized since last visit: No Precautions: Implantable device outside of the clinic excluding No Patient Has Alerts: Yes cellular tissue based products placed in the center Patient Alerts: Patient on [name] since last visit: LTBI: 0.88 09/25/2021 Pain Present Now: No VVS Electronic Signature(s) Signed: 02/10/2023 9:00:09 AM By: [name] , , Entered By: [name] on 02/10/2023 09:00:08 -------------------------------------------------------------------------------- Encounter Discharge Information Details Patient Name: Date of Service: [facility], [name] 02/10/2023 8:00 Bill M Medical Record Number: [id number] Patient Account Number: [id number] Date of Birth/Sex: Treating RN: 1946/09/03 (77 y.o. [name] Primary Care Tiffane Sheldon: [name], [name] Other Clinician: [name] Referring  Naly Schwanz: Treating Edson Deridder/Extender: [name], [name], [name] in Treatment: 7 Encounter Discharge Information Items Discharge Condition: Stable Ambulatory Status: Walker Discharge Destination: Other (Note Required) Transportation: Other Accompanied By: staff Schedule Follow-up Appointment: No Clinical Summary of Care: Notes Wound Care Encounter after treatment today. Electronic Signature(s) Signed: 02/10/2023 1:25:13 PM By: [name] , , Entered By: [name] on 02/10/2023 13:25:13 -------------------------------------------------------------------------------- Vitals Details Patient Name: Date of Service: [name], [name] 02/10/2023 8:00 Bill M Medical Record Number: [id number] Patient Account Number: [id number] Date of Birth/Sex: Treating RN: [birth year] (77 y.o. [name] Primary Care Currie Dennin: [name], [name] Other Clinician: [name] Referring Primitivo Merkey: Treating Oveta Idris/Extender: [name], [name], [name] in Treatment: [address], [place] ([id number]) [phone].pdf Page 2 of 2 Vital Signs Time Taken: 07:35 Temperature (F): 98.0 Height (in): 71 Pulse (bpm): 82 Weight (lbs): 252 Respiratory Rate (breaths/min): 18 Body Mass Index (BMI): 35.1 [name]: 115/65 Capillary [name]: 205 Reference Range: 80 - 120 mg / dl Electronic Signature(s) Signed: 02/10/2023 9:00:40 AM By: [name] , , Entered By: [name] on 02/10/2023 09:00:40

## 2023-02-11 NOTE — Progress Notes (Signed)
WYLLIAM, HANEK ([id number]) [id number] Page 1 of 1 Visit Report for 02/10/2023 [name] Details Patient Name: Date of Service: [name]. 02/10/2023 Medical Record Number: [id number] Patient Account Number: [id number] Date of Birth/Sex: Treating RN: June 26, 1946 (77 y.o. 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 C61 Malignant neoplasm of prostate E11.621 Type 2 diabetes mellitus with foot ulcer L97.512 Non-pressure chronic ulcer of other part of right foot with fat layer exposed Facility Procedures CPT4 Code Description Modifier Quantity [id number] G0277-(Facility Use Only) HBOT full body chamber, 17mn , 4 ICD-10 Diagnosis Description N30.41 Irradiation cystitis with hematuria C61 Malignant neoplasm of prostate E11.621 Type 2 diabetes mellitus with foot ulcer Physician Procedures Quantity CPT4 Code Description Modifier [id number] PHYS HYPERBARIC OXYGEN THERAPY 1 ICD-10 Diagnosis Description N30.41 Irradiation cystitis with hematuria C61 Malignant neoplasm of prostate E11.621 Type 2 diabetes mellitus with foot ulcer Electronic Signature(s) Signed: 02/10/2023 11:28:44 AM By: SDonavan BurnetCHT EMT BS , , Signed: 02/10/2023 12:29:31 PM By: HKalman ShanDO Entered By: SDonavan Burneton 02/10/2023 11:28:44

## 2023-02-11 NOTE — Progress Notes (Addendum)
Bill, [name] ([id number].pdf Page 1 of 2 Visit Report for 02/10/2023 HBO Details Patient Name: Date of Service: [name]. 02/10/2023 8:00 A M Medical Record Number: [id number] Patient Account Number: [id number] Date of Birth/Sex: Treating RN: 1946/05/05 (77 y.o. [name], [name] Primary Care Rosenda Geffrard: [name], [name] Other Clinician: Valeria Batman Referring Saajan Willmon: Treating Giah Fickett/Extender: [name], [name], [name] in Treatment: 7 HBO Treatment Course Details Treatment Course Number: 1 Ordering Katie Faraone: [name] T Treatments Ordered: otal 40 HBO Treatment Start Date: 01/05/2023 HBO Indication: Late Effect of Radiation HBO Treatment Details Treatment Number: 26 Patient Type: Outpatient Chamber Type: Monoplace Chamber Serial #: [id number] Treatment Protocol: 2.5 ATA with 90 minutes oxygen, with two 5 minute air breaks Treatment Details Compression Rate Down: 1.5 psi / minute De-Compression Rate Up: A breaks and breathing ir Compress Tx Pressure periods Decompress Decompress Begins Reached (leave unused spaces Begins Ends blank) Chamber Pressure (ATA 1 2.5 2.5 2.5 2.5 2.5 - - 2.5 1 ) Clock Time (24 hr) 07:44 07:53 08:25 08:30 09:00 09:05 - - 09:35 09:47 Treatment Length: 123 (minutes) Treatment Segments: 4 Vital Signs Capillary [name] Reference Range: 80 - 120 mg / dl HBO Diabetic [name] Intervention Range: <131 mg/dl or >249 mg/dl Type: Time Vitals [name] Respiratory Capillary [name] Pulse Action Pulse: Temperature: Taken: Pressure: Rate: Glucose (mg/dl): Meter #: Oximetry (%) Taken: Pre 07:35 115/65 82 18 98 205 1 none per protocol Post 09:55 143/76 67 18 97.7 210 1 none per protocol Treatment Response Treatment Toleration: Well Treatment Completion Status: Treatment Completed without Adverse Event Treatment Notes Patient arrived, prepared for treatment. Vital signs were taken  chamber-side; all within normal limits. After performing safety check, patient was placed in the chamber which was compressed at a rate of 1 psi/min until he confirmed that ear equalization was normal today. Rate set was then increased to 2 psi/min until reaching treatment depth of 2.5 ATA. Patient tolerated treatment and decompression of the chamber at 2 psi/min. Post-treatment vital signs were normal. Patient was stable upon discharge. Patient has wound care encounter after treatment today. Physician HBO Attestation: I certify that I supervised this HBO treatment in accordance with Medicare guidelines. A trained emergency response team is readily available per Yes hospital policies and procedures. Continue HBOT as ordered. Yes Electronic Signature(s) Signed: 02/11/2023 1:54:46 PM By: [name] DO Previous Signature: 02/10/2023 11:14:58 AM Version By: [name] , , Previous Signature: 02/10/2023 12:29:31 PM Version By: [name] DO Previous Signature: 02/10/2023 9:34:58 AM Version By: [name] , , Previous Signature: 02/10/2023 9:03:19 AM Version By: [name] , , Previous Signature: 02/10/2023 9:02:18 AM Version By: [name] , , Entered By: [name] on 02/11/2023 13:54:05 -------------------------------------------------------------------------------- HBO Safety Checklist Details Patient Name: Date of Service: [name], [name] 02/10/2023 8:00 A [name], Bill ([id number] Page 2 of 2 Medical Record Number: [id number] Patient Account Number: [id number] Date of Birth/Sex: Treating RN: [birth year] (77 y.o. [name] Primary Care Ronne Stefanski: [name], [name] Other Clinician: [name] Referring Ival Pacer: Treating Candi Profit/Extender: [name], [name], [name] in Treatment: 7 HBO Safety Checklist Items Safety Checklist Consent  Form Signed Patient voided / foley secured and emptied When did you last eato 0630 Last dose of injectable or oral agent 0640 Ostomy pouch emptied and vented if applicable NA All implantable devices assessed, documented and approved Bard PowerPort ClearVUE  Intravenous access site secured and place NA Valuables secured Linens and cotton and cotton/polyester blend (less than 51% polyester) Personal oil-based products / skin lotions / body lotions removed Wigs or hairpieces removed NA Smoking or tobacco materials removed NA Books / newspapers / magazines / loose paper removed Cologne, aftershave, perfume and deodorant removed Jewelry removed (may wrap wedding band) Make-up removed NA Hair care products removed Battery operated devices (external) removed Heating patches and chemical warmers removed Titanium eyewear removed Nail polish cured greater than 10 hours NA Casting material cured greater than 10 hours Cast applied more than 10 hrs ago Hearing aids removed NA Loose dentures or partials removed NA Prosthetics have been removed NA Patient demonstrates correct use of air break device (if applicable) Patient concerns have been addressed Patient grounding bracelet on and cord attached to chamber Specifics for Inpatients (complete in addition to above) Medication sheet sent with patient NA Intravenous medications needed or due during therapy sent with patient NA Drainage tubes (e.g. nasogastric tube or chest tube secured and vented) NA Endotracheal or Tracheotomy tube secured NA Cuff deflated of air and inflated with saline NA Airway suctioned NA Notes Paper version used prior to treatment start. Electronic Signature(s) Signed: 02/10/2023 9:01:46 AM By: [name] , , Entered By: [name] on 02/10/2023 09:01:46

## 2023-02-12 ENCOUNTER — Encounter (HOSPITAL_BASED_OUTPATIENT_CLINIC_OR_DEPARTMENT_OTHER): Payer: Medicare Other | Admitting: Internal Medicine

## 2023-02-12 DIAGNOSIS — R300 Dysuria: Secondary | ICD-10-CM | POA: Diagnosis not present

## 2023-02-12 DIAGNOSIS — R3915 Urgency of urination: Secondary | ICD-10-CM | POA: Diagnosis not present

## 2023-02-12 DIAGNOSIS — R310 Gross hematuria: Secondary | ICD-10-CM | POA: Diagnosis not present

## 2023-02-12 DIAGNOSIS — Z8546 Personal history of malignant neoplasm of prostate: Secondary | ICD-10-CM | POA: Diagnosis not present

## 2023-02-12 NOTE — Progress Notes (Signed)
TAVEN, ORBIN ([id number]) [id number] Page 1 of 7 Visit Report for 02/10/2023 Arrival Information Details Patient Name: Date of Service: [name] Medical Record Number: [id number] Patient Account Number: [id number] Date of Birth/Sex: Treating RN: August 29, 1946 (77 y.o. [name], [name] Primary Care [name]: [name] Other Clinician: Referring [name]: Treating [name]/Extender: [name] [name] Weeks in Treatment: 7 Visit Information History Since Last Visit Added or deleted any medications: No Patient Arrived: [name] Any new allergies or adverse reactions: No Arrival Time: 10:52 Had [name] fall or experienced change in No Accompanied By: self activities of daily living that may affect Transfer Assistance: None risk of falls: Patient Identification Verified: Yes Signs or symptoms of abuse/neglect since last No Secondary Verification Process Completed: Yes visito Patient Requires Transmission-Based No Hospitalized since last visit: No Precautions: Implantable device outside of the clinic No Patient Has Alerts: Yes excluding Patient Alerts: Patient on [name] cellular tissue based products placed in the LTBI: 0.88 09/25/2021 [name] VVS since last visit: Has Dressing in Place as Prescribed: Yes Has Footwear/Offloading in Place as Yes Prescribed: Right: Removable Cast Walker/Walking Boot Pain Present Now: No Electronic Signature(s) Signed: 02/11/2023 6:07:49 PM By: Deon Pilling RN, BSN Entered By: Deon Pilling on 02/10/2023 10:54:04 -------------------------------------------------------------------------------- Encounter Discharge Information Details Patient Name: Date of Service: [name]. 02/10/2023 11:00 [name] Medical Record Number: [id number] Patient Account Number: [id number] Date of Birth/Sex: Treating RN: Jan 22, 1946 (77 y.o. [name] Primary Care Idabell Picking: [name],  [name] Other Clinician: Referring Akili Cuda: Treating [name]/Extender: [name] [name] Weeks in Treatment: 7 Encounter Discharge Information Items Post Procedure Vitals Discharge Condition: Stable Unable to obtain vitals Reason: see post HBO VS. Ambulatory Status: Wheelchair Discharge Destination: Home Transportation: Private Auto Accompanied By: self Schedule Follow-up Appointment: Yes Clinical Summary of Care: Electronic Signature(s) Signed: 02/11/2023 6:07:49 PM By: Deon Pilling RN, BSN Entered By: Deon Pilling on 02/10/2023 11:14:27 -------------------------------------------------------------------------------- Lower Extremity Assessment Details Patient Name: Date of Service: [name], [name] 02/10/2023 11:00 [name] Medical Record Number: [id number] Patient Account Number: [id number] Date of Birth/Sex: Treating RN: March 19, 1946 (77 y.o. [name] Primary Care Ibraham Levi: [name] Other Clinician: Referring Urho Rio: Treating [name]/Extender: [name] [name] [facility], [name] ([id number]) [id number] Page 2 of 7 Weeks in Treatment: 7 Edema Assessment Assessed: [Left: No] [Right: No] Edema: [Left: [name]] [Right: o] Calf Left: Right: Point of Measurement: 38 cm From Medial Instep 38 cm Ankle Left: Right: Point of Measurement: 13 cm From Medial Instep 26 cm Vascular Assessment Pulses: Dorsalis Pedis Palpable: [Right:Yes] Electronic Signature(s) Signed: 02/11/2023 6:07:49 PM By: Deon Pilling RN, BSN Entered By: Deon Pilling on 02/10/2023 10:55:38 -------------------------------------------------------------------------------- Multi Wound Chart Details Patient Name: Date of Service: [name], [name] 02/10/2023 11:00 [name] Medical Record Number: [id number] Patient Account Number: [id number] Date of Birth/Sex: Treating RN: 09/13/1946 (77 y.o. [name]) Primary Care Nyala Kirchner: [name] Other  Clinician: Referring [name]: Treating [name]/Extender: [name] [name] Weeks in Treatment: 7 [1:Photos:] [[name]:[name]] Right Metatarsal head fifth [name] Wound Location: Gradually Appeared [name] Wounding Event: Diabetic Wound/Ulcer of the Lower [name] Primary Etiology: Extremity Anemia, Sleep Apnea, Arrhythmia, [name] Comorbid History: Coronary Artery Disease, Hypertension, Myocardial Infarction, Type II Diabetes, Osteoarthritis, Neuropathy, Received Chemotherapy, Received Radiation 01/22/2022 [name] Date Acquired: 2 [name] Weeks of Treatment: Open [name] Wound Status: No [name] Wound Recurrence: 0.1x0.1x0.1 [name] Measurements [name] x W  x D (cm) 0.008 [name] [name] (cm) : rea 0.001 [name] Volume (cm) : 74.20% [name] % Reduction in [name] rea: 88.90% [name] % Reduction in Volume: Grade 1 [name] Classification: Medium [name] Exudate [name] mount: Serosanguineous [name] Exudate Type: red, brown [name] Exudate Color: Distinct, outline attached [name] Wound Margin: Large (67-100%) [name] Granulation [name] mount: None Present (0%) [name] Necrotic [name] mount: Fat Layer (Subcutaneous Tissue): Yes [name] Exposed Structures: [name], [name] ([id number]) [id number] Page 3 of 7 Fascia: No Tendon: No Muscle: No Joint: No Bone: No Large (67-100%) [name] Epithelialization: Debridement - Selective/Open Wound [name] Debridement: Pre-procedure Verification/Time Out 11:00 [name] Taken: Lidocaine 4% Topical Solution [name] Pain Control: Callus [name] Tissue Debrided: Skin/Epidermis [name] Level: 1 [name] Debridement [name] (sq cm): rea Curette [name] Instrument: None [name] Bleeding: 0 [name] Procedural Pain: 0 [name] Post Procedural Pain: Procedure was tolerated well [name] Debridement Treatment Response: 0.1x0.1x0.1 [name] Post Debridement Measurements [name] x W x D (cm) 0.001 [name] Post Debridement  Volume: (cm) Callus: Yes [name] Periwound Skin Texture: Excoriation: No Induration: No Crepitus: No Rash: No Scarring: No Dry/Scaly: Yes [name] Periwound Skin Moisture: Maceration: No Atrophie Blanche: No [name] Periwound Skin Color: Cyanosis: No Ecchymosis: No Erythema: No Hemosiderin Staining: No Mottled: No Pallor: No Rubor: No Debridement [name] Procedures Performed: T Contact Cast otal Treatment Notes Wound #1 (Metatarsal head fifth) Wound Laterality: Right Cleanser Wound Cleanser Discharge Instruction: Cleanse the wound with wound cleanser prior to applying [name] clean dressing using gauze sponges, not tissue or cotton balls. Peri-Wound Care Skin Prep Discharge Instruction: Use skin prep as directed Topical Primary Dressing Xeroform Occlusive Gauze Dressing, 4x4 in Discharge Instruction: Apply to wound bed as instructed Secondary Dressing Woven Gauze Sponges 2x2 in Discharge Instruction: Apply over primary dressing as directed. Secured With Compression Wrap Compression Stockings Add-Ons Electronic Signature(s) Signed: 02/10/2023 3:46:53 PM By: [name] DO Entered By: [name] on 02/10/2023 12:45:47 -------------------------------------------------------------------------------- Multi-Disciplinary Care Plan Details Patient Name: Date of Service: [facility] [name], [name] 02/10/2023 11:00 [name] [name], [name] ([id number]) [id number] Page 4 of 7 Medical Record Number: [id number] Patient Account Number: [id number] Date of Birth/Sex: Treating RN: [birth year] (77 y.o. [name], [name] Primary Care Tyjay Galindo: [name] Other Clinician: Referring Ryatt Corsino: Treating Ioanna Colquhoun/Extender: [name] [name] Weeks in Treatment: 7 Active Inactive HBO Nursing Diagnoses: Anxiety related to feelings of confinement associated with the hyperbaric oxygen chamber Potential for barotraumas to ears, sinuses, teeth, and  lungs or cerebral gas embolism related to changes in atmospheric pressure inside hyperbaric oxygen chamber Goals: Patient will tolerate the hyperbaric oxygen therapy treatment Date Initiated: 12/22/2022 Target Resolution Date: 02/13/2023 Goal Status: Active Patient/caregiver will verbalize understanding of HBO goals, rationale, procedures and potential hazards Date Initiated: 12/22/2022 Target Resolution Date: 02/13/2023 Goal Status: Active Interventions: Assess and provide for patients comfort related to the hyperbaric environment and equalization of middle ear Assess patient for any history of confinement anxiety Notes: Pain, Acute or Chronic Nursing Diagnoses: Pain, acute or chronic: actual or potential Potential alteration in comfort, pain Goals: Patient will verbalize adequate pain control and receive pain control interventions during procedures as needed Date Initiated: 01/22/2023 Target Resolution Date: 02/18/2023 Goal Status: Active Interventions: Encourage patient to take pain medications as prescribed Provide education on pain management Treatment Activities: Administer pain control measures as ordered : 01/22/2023 Notes: Wound/Skin  Impairment Nursing Diagnoses: Knowledge deficit related to ulceration/compromised skin integrity Goals: Patient/caregiver will verbalize understanding of skin care regimen Date Initiated: 01/22/2023 Target Resolution Date: 02/20/2023 Goal Status: Active Interventions: Assess patient/caregiver ability to perform ulcer/skin care regimen upon admission and as needed Assess ulceration(s) every visit Provide education on ulcer and skin care Treatment Activities: Skin care regimen initiated : 01/22/2023 Topical wound management initiated : 01/22/2023 Notes: Electronic Signature(s) Signed: 02/11/2023 6:07:49 PM By: Deon Pilling RN, BSN Entered By: Deon Pilling on 02/10/2023 11:12:47 [name], [name] ([id number]) [id number] Page 5  of 7 -------------------------------------------------------------------------------- Pain Assessment Details Patient Name: Date of Service: [name] Medical Record Number: [id number] Patient Account Number: [id number] Date of Birth/Sex: Treating RN: July 23, 1946 (77 y.o. [name] Primary Care Collier Bohnet: [name] Other Clinician: Referring Woodruff Skirvin: Treating Arlander Gillen/Extender: [name] [name] Weeks in Treatment: 7 Active Problems Location of Pain Severity and Description of Pain Patient Has Paino No Site Locations Pain Management and Medication Current Pain Management: Electronic Signature(s) Signed: 02/11/2023 6:07:49 PM By: Deon Pilling RN, BSN Entered By: Deon Pilling on 02/10/2023 10:54:31 -------------------------------------------------------------------------------- Patient/Caregiver Education Details Patient Name: Date of Service: [name], [name] 2/27/2024andnbsp11:00 [place] Record Number: [id number] Patient Account Number: [id number] Date of Birth/Gender: Treating RN: 07/01/1946 (77 y.o. [name] Primary Care Physician: [name] Other Clinician: Referring Physician: Treating Physician/Extender: [name] [name] Weeks in Treatment: 7 Education Assessment Education Provided To: Patient Education Topics Provided Wound/Skin Impairment: Handouts: Caring for Your Ulcer Methods: Explain/Verbal Responses: Reinforcements needed Electronic Signature(s) Signed: 02/11/2023 6:07:49 PM By: Deon Pilling RN, BSN Entered By: Deon Pilling on 02/10/2023 [place] ([id number]) [id number] Page 6 of 7 -------------------------------------------------------------------------------- Wound Assessment Details Patient Name: Date of Service: [name] Medical Record Number: [id number] Patient Account Number:  [id number] Date of Birth/Sex: Treating RN: 04-19-1946 (77 y.o. [name] Primary Care Kellin Fifer: [name] Other Clinician: Referring Marria Mathison: Treating Esco Joslyn/Extender: [name] [name] Weeks in Treatment: 7 Wound Status Wound Number: 1 Primary Diabetic Wound/Ulcer of the Lower Extremity Etiology: Wound Location: Right Metatarsal head fifth Wound Open Wounding Event: Gradually Appeared Status: Date Acquired: 01/22/2022 Comorbid Anemia, Sleep Apnea, Arrhythmia, Coronary Artery Disease, Weeks Of Treatment: 2 History: Hypertension, Myocardial Infarction, Type II Diabetes, Clustered Wound: No Osteoarthritis, Neuropathy, Received Chemotherapy, Received Radiation Photos Wound Measurements Length: (cm) Width: (cm) Depth: (cm) Area: (cm) Volume: (cm) 0.1 % Reduction in Area: 74.2% 0.1 % Reduction in Volume: 88.9% 0.1 Epithelialization: Large (67-100%) 0.008 Tunneling: No 0.001 Undermining: No Wound Description Classification: Grade 1 Wound Margin: Distinct, outline attached Exudate Amount: Medium Exudate Type: Serosanguineous Exudate Color: red, brown Foul Odor After Cleansing: No Slough/Fibrino No Wound Bed Granulation Amount: Large (67-100%) Exposed Structure Necrotic Amount: None Present (0%) Fascia Exposed: No Fat Layer (Subcutaneous Tissue) Exposed: Yes Tendon Exposed: No Muscle Exposed: No Joint Exposed: No Bone Exposed: No Periwound Skin Texture Texture Color No Abnormalities Noted: No No Abnormalities Noted: No Callus: Yes Atrophie Blanche: No Crepitus: No Cyanosis: No Excoriation: No Ecchymosis: No Induration: No Erythema: No Rash: No Hemosiderin Staining: No Scarring: No Mottled: No Pallor: No Moisture Rubor: No No Abnormalities Noted: No Dry / Scaly: Yes Maceration: No Treatment Notes [name], [name] ([id number]) [id number] Page 7 of 7 Wound #1 (Metatarsal head fifth) Wound Laterality:  Right Cleanser Wound Cleanser Discharge Instruction: Cleanse the wound with wound cleanser prior to applying [name] clean  dressing using gauze sponges, not tissue or cotton balls. Peri-Wound Care Skin Prep Discharge Instruction: Use skin prep as directed Topical Primary Dressing Xeroform Occlusive Gauze Dressing, 4x4 in Discharge Instruction: Apply to wound bed as instructed Secondary Dressing Woven Gauze Sponges 2x2 in Discharge Instruction: Apply over primary dressing as directed. Secured With Compression Wrap Compression Stockings Environmental education officer) Signed: 02/11/2023 3:49:00 PM By: Erenest Blank Signed: 02/11/2023 6:07:49 PM By: Deon Pilling RN, BSN Entered By: Erenest Blank on 02/10/2023 11:00:17 -------------------------------------------------------------------------------- Vitals Details Patient Name: Date of Service: [place], [name] 02/10/2023 11:00 [name] Medical Record Number: [id number] Patient Account Number: [id number] Date of Birth/Sex: Treating RN: [birth year] (77 y.o. [name] Primary Care Wellington Winegarden: [name] Other Clinician: Referring Nastassia Bazaldua: Treating Carime Dinkel/Extender: [name] [name] Weeks in Treatment: 7 Vital Signs Time Taken: 10:54 Reference Range: 80 - 120 mg / dl Height (in): 71 Weight (lbs): 252 Body Mass Index (BMI): 35.1 Notes See HBO vital signs Electronic Signature(s) Signed: 02/11/2023 6:07:49 PM By: Deon Pilling RN, BSN Entered By: Deon Pilling on 02/10/2023 10:54:24

## 2023-02-12 NOTE — Progress Notes (Signed)
[name], [name] ([id number].pdf Page 1 of 2 Visit Report for 02/11/2023 Problem List Details Patient Name: Date of Service: [name] 02/11/2023 8:00 A M Medical Record Number: [id number] Patient Account Number: [id number] Date of Birth/Sex: Treating RN: 09/23/1946 (77 y.o. 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 02/11/2023 Medical Record Number: [id number] Patient Account Number: [id number] Date of Birth/Sex: Treating RN: 10/05/1946 (77 y.o. 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 C61 Malignant neoplasm of prostate E11.621 Type 2 diabetes mellitus with foot ulcer L97.512 Non-pressure chronic ulcer of other part of right foot with fat layer exposed Facility Procedures : CPT4 Code: [id number] Description: G0277-(Facility Use Only) HBOT full body chamber, 14mn , ICD-10 Diagnosis Description N30.41 Irradiation cystitis with hematuria C61 Malignant neoplasm of prostate E11.621 Type 2 diabetes mellitus with foot ulcer Modifier: Quantity: 4 Physician Procedures : CPT4 Code Description Modifier [id number] PHYS HYPERBARIC OXYGEN THERAPY ICD-10 Diagnosis Description N30.41 Irradiation cystitis with hematuria C61 Malignant neoplasm of prostate E11.621 Type 2 diabetes mellitus with foot ulcer Quantity: 1 Electronic Signature(s) Signed: 02/11/2023 11:34:25 AM By: GValeria BatmanEMT Signed: 02/11/2023 3:56:54 PM By: SWorthy KeelerPA-C Entered By: GValeria Batmanon 02/11/2023 11:34:24

## 2023-02-12 NOTE — Progress Notes (Signed)
[name] [name] ([id number]) [id number] Page 1 of 9 Visit Report for 02/10/2023 Chief Complaint Document Details Patient Name: Date of Service: SPA [name] [name] 02/10/2023 11:00 [name] Medical Record Number: [id number] Patient Account Number: [id number] Date of Birth/Sex: Treating RN: 1946/10/16 (77 y.o. 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 02/10/2023 11:00 [name] Medical Record Number: [id number] Patient Account Number: [id number] Date of Birth/Sex: Treating RN: 1946/05/23 (77 y.o. [name] [name] Primary Care Provider: [name], [name] Other Clinician: Referring Provider: Treating Provider/Extender: [name] [name], [name]: 7 Debridement Performed for Assessment: Wound #1 Right Metatarsal head fifth Performed By: Physician [name] Shan, DO Debridement Type: Debridement Severity of Tissue Pre Debridement: Fat layer exposed Level of Consciousness (Pre-procedure): Awake and Alert Pre-procedure Verification/Time Out Yes - 11:00 Taken: Start Time: 11:01 Pain Control: Lidocaine 4% T opical Solution T Area Debrided ([name] x W): otal 1 (cm) x 1 (cm) = 1 (cm) Tissue and other material debrided: Viable, Non-Viable, Callus, Skin: Dermis , Skin: Epidermis Level: Skin/Epidermis Debridement Description: Selective/Open Wound Instrument: Curette Bleeding:  None End Time: 11:10 Procedural Pain: 0 Post Procedural Pain: 0 Response to Treatment: Procedure was tolerated well Level of Consciousness (Post- Awake and Alert procedure): Post Debridement Measurements of Total Wound Length: (cm) 0.1 Width: (cm) 0.1 Depth: (cm) 0.1 Volume: (cm) 0.001 Character of Wound/Ulcer Post Debridement: Improved Severity of Tissue Post Debridement: Fat layer exposed Post Procedure Diagnosis Same as Pre-procedure Electronic Signature(s) Signed: 02/10/2023 12:29:31 PM By: [name] Shan DO Signed: 02/11/2023 6:07:49 PM By: [name] Pilling RN, BSN Entered By: [name] [name] on 02/10/2023 11:11:38 [name] [name] ([id number]) [id number] Page 2 of 9 -------------------------------------------------------------------------------- HPI Details Patient Name: Date of Service: SPA [name] [name] 02/10/2023 11:00 [name] Medical Record Number: [id number] Patient Account Number: [id number] Date of Birth/Sex: Treating RN: 11-11-1946 (77 y.o. [name]) Primary Care Provider: [name], [name] Other Clinician: Referring Provider: Treating Provider/Extender: [name] [name] [name], [name]: 7 History of Present Illness HPI Description: 12/22/2022 Mr. [name] [name] is [name] 77 year old male with [name] past medical history of insulin-dependent controlled type 2 diabetes with last hemoglobin A1c of 6.8 on 12/11/2019, primary cancer of body of pancreas, prostate cancer status post radiation, essential hypertension and CABG that presents to the clinic for discussion of HBO therapy in the setting of radiation cystitis. For his prostate cancer patient was started on [name] ADT on 09/17/2012, and radiation therapy to [name] dose of 78 GY completed on 01/21/2013. Lupron #8/8 completed on 06/23/2014. He has described urinary urgency and frequency over the past several years. He follows with urology last seen on 11/21/2022 and was referred to [place] for HBO therapy. During  that visit he described symptoms of urinary urgency/frequency/weak stream along with gross hematuria. He had [name] cystoscopy on 03/04/2022 that showed evidence of radiation cystitis changes with some increased neovascularity along the posterior bladder and trigone with no suspicious area concerning for CIS or papillary lesions. He reports 2 episodes of gross hematuria over the past year. He currently denies signs of infection. 01/22/2023; patient presents for [name]  right foot wound. He states the wound started out as [name] callus then opened and has remained this way for [name] year. He has been using antibiotic ointment to the area. He is not aggressively offloading this. He currently denies signs of infection. He is currently in HBO therapy for radiation cystitis and has been tolerating this well. 2/12; patient presents for follow-up at the right foot wound. He is agreeable with the cast placement today. 01-28-2023 upon evaluation today patient appears today for reevaluation here in the clinic. This is [name] wound currently on his foot which is being managed by Dr. Heber [name]. With that being said it appears to be doing decently well today he had his first cast placed on Monday today he is here for his obligatory first cast change. Good news is nothing appears to be rubbing or causing any detriment anywhere on the leg at this point. 2/19; patient presents for follow-up. We have been doing [name] total contact cast to the right lower extremity with endoform. He has tolerated this well. 2/27; patient presents for follow-up. He has tolerated the total contact cast well. His wound is almost healed. Patient has completed 26 out of 40 HBO treatment sessions. He reports [name] decrease in urinary frequency and urgency. He feels like the HBO treatments are helping him. He has not mentioned any further UTIs. Electronic Signature(s) Signed: 02/10/2023 3:46:53 PM By: [name] Shan DO Entered By: [name] [name] on 02/10/2023  12:47:23 -------------------------------------------------------------------------------- Physical Exam Details Patient Name: Date of Service: [place], [name] LBERT [name]. 02/10/2023 11:00 [name] Medical Record Number: [id number] Patient Account Number: [id number] Date of Birth/Sex: Treating RN: November 10, 1946 (77 y.o. [name]) Primary Care Provider: [name], [name] Other Clinician: Referring Provider: Treating Provider/Extender: Willodene Stallings [name], [name]: 7 Constitutional respirations regular, non-labored and within target range for patient.. Cardiovascular 2+ dorsalis pedis/posterior tibialis pulses. Psychiatric pleasant and cooperative. Notes Right foot: T the fifth met head there is [name] small open wound with granulation tissue present. No signs of infection. o Electronic Signature(s) Signed: 02/10/2023 3:46:53 PM By: [name] Shan DO Entered By: [name] [name] on 02/10/2023 12:48:19 Physician Orders Details -------------------------------------------------------------------------------- [name] [name] ([id number]) [id number] Page 3 of 9 Patient Name: Date of Service: SPA [name] [name] 02/10/2023 11:00 [name] Medical Record Number: [id number] Patient Account Number: [id number] Date of Birth/Sex: Treating RN: Nov 25, 1946 (77 y.o. 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 - Dr [name] [place]  Tuesday 02/17/23 after HBO Return [name] Anesthetic (In clinic) Topical Lidocaine 4% applied to wound bed Bathing/ Shower/ Hygiene May shower with protection but do not get wound dressing(s) wet. Protect dressing(s) with water repellant cover (for example, large plastic bag) or [name] cast cover and may then take shower. Off-Loading Total Contact Cast to Right Lower Extremity - size 3 Removable cast walker boot to: Hyperbaric Oxygen Therapy Evaluate for HBO Therapy Indication: - Radiation Cystitis If appropriate for treatment, begin HBOT per protocol: 2.5 ATA for 90 Minutes with 2 Five (5) Minute [name] Breaks ir Total Number of Treatments: - 40 One  treatments per day (delivered Monday through Friday unless otherwise specified in Special Instructions below): Finger stick [name] Pre- and Post- HBOT Treatment. Follow Hyperbaric Oxygen Glycemia Protocol [name] frin (Oxymetazoline HCL) 0.05% nasal spray - 1 spray in both nostrils daily as needed prior to HBO treatment for difficulty clearing ears Wound Treatment Wound #1 - Metatarsal head fifth Wound Laterality: Right Cleanser: Wound Cleanser 1 x Per Week/30 Days Discharge Instructions: Cleanse the wound with wound cleanser prior to applying [name] clean dressing using gauze sponges, not tissue or cotton balls. Peri-Wound Care: Skin Prep 1 x Per Week/30 Days Discharge Instructions: Use skin prep as directed Prim Dressing: Xeroform Occlusive Gauze Dressing, 4x4 in 1 x Per Week/30 Days ary Discharge Instructions: Apply to wound bed as instructed Secondary Dressing: Woven Gauze Sponges 2x2 in 1 x Per Week/30 Days Discharge Instructions: Apply over primary dressing as directed. GLYCEMIA INTERVENTIONS PROTOCOL PRE-HBO GLYCEMIA INTERVENTIONS ACTION INTERVENTION Obtain pre-HBO capillary [name] (ensure 1 physician order is in chart). [name]. Notify HBO physician and await physician orders. 2 If result is 70 mg/dl or below: B. If the result meets the  hospital definition of [name] critical result, follow hospital policy. [name]. Give patient an 8 ounce Glucerna Shake, an 8 ounce Ensure, or 8 ounces of [name] Glucerna/Ensure equivalent dietary supplement*. B. Wait 30 minutes. If result is 71 mg/dl to 130 mg/dl: C. Retest patients capillary [name] (CBG). D. If result greater than or equal to 110 mg/dl, proceed with HBO. If result less than 110 mg/dl, notify HBO Gauthier, Alphonse [name] ([id number]) [phone].pdf Page 4 of 9 physician and consider holding HBO. If result is 131 mg/dl to 249 mg/dl: [name]. Proceed with HBO. [name]. Notify HBO physician and await physician orders. B. It is recommended to hold HBO and do If result is 250 mg/dl or greater: [name]/urine ketone testing. C. If the result meets the hospital definition of [name] critical result, follow hospital policy. POST-HBO GLYCEMIA INTERVENTIONS ACTION INTERVENTION Obtain post HBO capillary [name] (ensure 1 physician order is in chart). [name]. Notify HBO physician and await physician orders. 2 If result is 70 mg/dl or below: B. If the result meets the hospital definition of [name] critical result, follow hospital policy. [name]. Give patient an 8 ounce Glucerna Shake, an 8 ounce Ensure, or 8 ounces of [name] Glucerna/Ensure equivalent dietary supplement*. B. Wait 15 minutes for symptoms of If result is 71 mg/dl to 100 mg/dl: hypoglycemia (i.e. nervousness, anxiety, sweating, chills, clamminess, irritability, confusion, tachycardia or dizziness). C. If patient asymptomatic, discharge patient. If patient symptomatic, repeat capillary [name] (CBG) and notify HBO physician. If result is 101 mg/dl to 249 mg/dl: [name]. Discharge patient. [name]. Notify HBO physician and await physician orders. B. It is recommended to do [name]/urine ketone If result is 250 mg/dl or greater: testing. C. If the result meets the hospital definition of [name] critical result, follow hospital  policy. *Juice or candies are NOT equivalent products. If patient refuses the Glucerna or Ensure, please consult the hospital dietitian for an appropriate substitute. Electronic Signature(s) Signed: 02/10/2023 3:46:53 PM By: [name] Shan DO Previous Signature: 02/10/2023 12:29:31 PM Version By: [name] Shan DO Entered By: [name] [name] on 02/10/2023 12:48:31 -------------------------------------------------------------------------------- Problem List Details Patient Name: Date of Service: [name] [name], [name] LBERT [name]. 02/10/2023 11:00 [name] Medical Record Number: [id number] Patient Account Number: [id number] Date of Birth/Sex: Treating RN: [birth year] (77 y.o. 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 12/22/2022 No Yes C61 Malignant neoplasm of prostate 12/22/2022 No Yes E11.621 Type 2 diabetes mellitus with foot ulcer 01/22/2023 No Yes Bressi, Vitor [name] ([id number]) [id number] Page 5 of 9 L97.512 Non-pressure chronic ulcer of other part of right foot with fat layer exposed 01/22/2023 No Yes Inactive Problems Resolved Problems Electronic Signature(s) Signed: 02/10/2023 3:46:53 PM By: [name] Shan DO Previous Signature: 02/10/2023 12:29:31 PM Version By: [name] Shan DO Entered By: [name] [name] on 02/10/2023 12:45:42 -------------------------------------------------------------------------------- Progress Note Details Patient Name: Date of Service: [name] [name], [name] LBERT [name]. 02/10/2023 11:00 [name] Medical Record Number: [id number] Patient Account Number: [id number] Date of Birth/Sex:  Treating RN: 10-10-1946 (77 y.o. [name]) Primary Care Provider: [name], [name] Other Clinician: Referring Provider: Treating Provider/Extender: Jossalyn Forgione [name], [name]: 7 Subjective Chief Complaint Information obtained from Patient 12/22/2022; referral for hyperbaric oxygen therapy in the setting of radiation cystitis, 2/8; right fifth met head plantar foot wound History of Present Illness (HPI) 12/22/2022 Mr. [name] [name] is [name] 77 year old male with [name] past medical history of insulin-dependent controlled type 2 diabetes with last hemoglobin A1c of 6.8 on 12/11/2019, primary cancer of body of pancreas, prostate cancer status post radiation, essential hypertension and CABG that presents to the clinic for discussion of HBO therapy in the setting of radiation cystitis. For his prostate cancer patient was started on [name] ADT on 09/17/2012, and radiation therapy to [name] dose of 78 GY completed on 01/21/2013. Lupron #8/8 completed on 06/23/2014. He has described urinary urgency and frequency over the past several years. He follows with urology last seen on 11/21/2022 and was referred to [place] for HBO therapy. During that visit he described symptoms of urinary urgency/frequency/weak stream along with gross hematuria. He had [name] cystoscopy on 03/04/2022 that showed evidence of radiation cystitis changes with some increased neovascularity along the posterior bladder and trigone with no suspicious area concerning for CIS or papillary lesions. He reports 2 episodes of gross hematuria over the past year. He currently denies signs of infection. 01/22/2023; patient presents for [name] right foot wound. He states the wound started out as [name] callus then opened and has remained this way for [name] year. He has been using antibiotic ointment to the area. He is not aggressively offloading this. He currently denies signs of infection. He is currently in HBO therapy for radiation cystitis and has been tolerating this  well. 2/12; patient presents for follow-up at the right foot wound. He is agreeable with the cast placement today. 01-28-2023 upon evaluation today patient appears today for reevaluation here in the clinic. This is [name] wound currently on his foot which is being managed by Dr. Heber [place]. With that being said it appears to be doing decently well today he had his first cast placed on Monday today he is here for his obligatory first cast change. Good news is nothing appears to be rubbing or causing any detriment anywhere on the leg at this point. 2/19; patient presents for follow-up. We have been doing [name] total contact cast to the right lower extremity with endoform. He has tolerated this well. 2/27; patient presents for follow-up. He has tolerated the total contact cast well. His wound is almost healed. Patient has completed 26 out of 40 HBO treatment sessions. He reports [name] decrease  in urinary frequency and urgency. He feels like the HBO treatments are helping him. He has not mentioned any further UTIs. Patient History Information obtained from Patient, Chart. Family History Unknown History. Social History Former smoker - quit in 1976, Marital Status - Married, Alcohol Use - Never, Drug Use - No History, Caffeine Use - Rarely. Medical History Hematologic/Lymphatic Patient has history of Anemia Respiratory Patient has history of Sleep Apnea - uses CPAP Cardiovascular Patient has history of Arrhythmia - [name] fibb, Coronary Artery Disease, Hypertension, Myocardial Infarction Endocrine Patient has history of Type II Diabetes Musculoskeletal Patient has history of Osteoarthritis Neurologic Patient has history of Neuropathy Oncologic [name] [name], [name] [name] ([id number]) [id number] Page 6 of 9 Patient has history of Received Chemotherapy - 2022- Pancreatic Ca with chemo, Received Radiation - 10-12 years ago for prostate Ca Psychiatric Denies history of Confinement  Anxiety Hospitalization/Surgery History - EUS. - Biopsy. - CA bypass graft. - cholecystectomy. - tonsillectomy. - appendectomy. - triple bypass. Medical [name] Surgical History Notes nd Cardiovascular hyperlipidemia Gastrointestinal fatty liver; GERD Genitourinary radiation cystitis, CKD, 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 Oncologic hx prostate ca Psychiatric PTSD Objective Constitutional respirations regular, non-labored and within target range for patient.. Vitals Time Taken: 10:54 AM, Height: 71 in, Weight: 252 lbs, BMI: 35.1. General Notes: See HBO vital signs Cardiovascular 2+ dorsalis pedis/posterior tibialis pulses. Psychiatric pleasant and cooperative. General Notes: Right foot: T the fifth met head there is [name] small open wound with granulation tissue present. No signs of infection. o Integumentary (Hair, Skin) Wound #1 status is Open. Original cause of wound was Gradually Appeared. The date acquired was: 01/22/2022. The wound has been in treatment 2 weeks. The wound is located on the Right Metatarsal head fifth. The wound measures 0.1cm length x 0.1cm width x 0.1cm depth; 0.008cm^2 area and 0.001cm^3 volume. There is Fat Layer (Subcutaneous Tissue) exposed. There is no tunneling or undermining noted. There is [name] medium amount of serosanguineous drainage noted. The wound margin is distinct with the outline attached to the wound base. There is large (67-100%) granulation within the wound bed. There is no necrotic tissue within the wound bed. The periwound skin appearance exhibited: Callus, Dry/Scaly. The periwound skin appearance did not exhibit: Crepitus, Excoriation, Induration, Rash, Scarring, Maceration, Atrophie Blanche, Cyanosis, Ecchymosis, Hemosiderin Staining, Mottled, Pallor, Rubor, Erythema. Assessment Active Problems ICD-10 Irradiation cystitis with hematuria Essential (primary) hypertension Type 2 diabetes mellitus with diabetic nephropathy Malignant neoplasm of prostate Type 2 diabetes  mellitus with foot ulcer Non-pressure chronic ulcer of other part of right foot with fat layer exposed Patient's left foot wound appears well-healing. I recommended Xeroform and the total contact cast. He is doing well with HBO therapy. This is his 30 day evaluation. He reports improvement in his symptoms of urinary frequency and urgency. I recommended continuing HBO. Procedures Wound #1 Pre-procedure diagnosis of Wound #1 is [name] Diabetic Wound/Ulcer of the Lower Extremity located on the Right Metatarsal head fifth .Severity of Tissue Pre Debridement is: Fat layer exposed. There was [name] Selective/Open Wound Skin/Epidermis Debridement with [name] total area of 1 sq cm performed by [name] Shan, DO. With the following instrument(s): Curette to remove Viable and Non-Viable tissue/material. Material removed includes Callus, Skin: Dermis, and Skin: Epidermis after achieving pain control using Lidocaine 4% Topical Solution. [name] time out was conducted at 11:00, prior to the start of the procedure. There was no bleeding. The procedure was tolerated well with [name] pain level of 0 throughout and [name] pain level of 0 following the procedure. Post  Debridement Measurements: 0.1cm length x 0.1cm width x 0.1cm depth; 0.001cm^3 volume. Character of Wound/Ulcer Post Debridement is improved. Severity of Tissue Post Debridement is: Fat layer exposed. Post procedure Diagnosis Wound #1: Same as Pre-Procedure [name] [name], [name] [name] ([id number]) [id number] Page 7 of 9 Pre-procedure diagnosis of Wound #1 is [name] Diabetic Wound/Ulcer of the Lower Extremity located on the Right Metatarsal head fifth . There was [name] T Interior and spatial designer Procedure by [name] Shan, DO. Post procedure Diagnosis Wound #1: Same as Pre-Procedure Plan Follow-up Appointments: Return Appointment in 1 week. - Dr [name] [place] Tuesday 02/17/23 after HBO Anesthetic: (In clinic) Topical Lidocaine 4% applied to wound bed Bathing/ Shower/  Hygiene: May shower with protection but do not get wound dressing(s) wet. Protect dressing(s) with water repellant cover (for example, large plastic bag) or [name] cast cover and may then take shower. Off-Loading: T Contact Cast to Right Lower Extremity - size 3 otal Removable cast walker boot to: Hyperbaric Oxygen Therapy: Evaluate for HBO Therapy Indication: - Radiation Cystitis If appropriate for treatment, begin HBOT per protocol: 2.5 ATA for 90 Minutes with 2 Five (5) Minute Air Breaks T Number of Treatments: - 40 otal One treatments per day (delivered Monday through Friday unless otherwise specified in Special Instructions below): Finger stick [name] Pre- and Post- HBOT Treatment. Follow Hyperbaric Oxygen Glycemia Protocol Afrin (Oxymetazoline HCL) 0.05% nasal spray - 1 spray in both nostrils daily as needed prior to HBO treatment for difficulty clearing ears WOUND #1: - Metatarsal head fifth Wound Laterality: Right Cleanser: Wound Cleanser 1 x Per Week/30 Days Discharge Instructions: Cleanse the wound with wound cleanser prior to applying [name] clean dressing using gauze sponges, not tissue or cotton balls. Peri-Wound Care: Skin Prep 1 x Per Week/30 Days Discharge Instructions: Use skin prep as directed Prim Dressing: Xeroform Occlusive Gauze Dressing, 4x4 in 1 x Per Week/30 Days ary Discharge Instructions: Apply to wound bed as instructed Secondary Dressing: Woven Gauze Sponges 2x2 in 1 x Per Week/30 Days Discharge Instructions: Apply over primary dressing as directed. 1. Xeroform 2. T contact cast placed in standard fashion otal 3. Continue HBO therapy Electronic Signature(s) Signed: 02/10/2023 3:46:53 PM By: [name] Shan DO Entered By: [name] [name] on 02/10/2023 12:51:13 -------------------------------------------------------------------------------- HxROS Details Patient Name: Date of Service: [name] [name], [name] LBERT [name]. 02/10/2023 11:00 [name] Medical Record Number:  [id number] Patient Account Number: [id number] Date of Birth/Sex: Treating RN: 03/15/1946 (77 y.o. [name]) Primary Care Provider: [name], [name] Other Clinician: Referring Provider: Treating Provider/Extender: Kaston Faughn [name], [name]: 7 Information Obtained From Patient Chart Hematologic/Lymphatic Medical History: Positive for: Anemia Respiratory Medical History: Positive for: Sleep Apnea - uses CPAP Cardiovascular Medical History[place], [name] [name] ([id number]) [id number] Page 8 of 9 Positive for: Arrhythmia - [name] fibb; Coronary Artery Disease; Hypertension; Myocardial Infarction Past Medical History Notes: hyperlipidemia Gastrointestinal Medical History: Past Medical History Notes: fatty liver; GERD Endocrine Medical History: Positive for: Type II Diabetes Time with diabetes: 10 years Treated with: Insulin [name] sugar tested every day: Yes Tested : TID Genitourinary Medical History: Past Medical History Notes: radiation cystitis, CKD, 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 Musculoskeletal Medical History: Positive for: Osteoarthritis Neurologic Medical History: Positive for: Neuropathy Oncologic Medical History: Positive for: Received Chemotherapy - 2022- Pancreatic Ca with chemo; Received Radiation - 10-12 years ago for prostate Ca Past Medical History Notes: hx prostate ca Psychiatric Medical History: Negative for: Confinement Anxiety Past Medical History Notes: PTSD Immunizations Pneumococcal Vaccine: Received Pneumococcal Vaccination: Yes Received Pneumococcal Vaccination On or  After 60th Birthday: Yes Implantable Devices None Hospitalization / Surgery History Type of Hospitalization/Surgery EUS Biopsy CA bypass graft cholecystectomy tonsillectomy appendectomy triple bypass Family and Social History Unknown History: Yes; Former smoker - quit in 1976; Marital Status - Married; Alcohol Use: Never; Drug Use: No History;  Caffeine Use: Rarely; Financial Concerns: No; Food, Clothing or Shelter Needs: No; Support System Lacking: No; Transportation Concerns: No Electronic Signature(s) Signed: 02/10/2023 3:46:53 PM By: [name] Shan DO Entered By: [name] [name] on 02/10/2023 12:47:29 [name] [name] ([id number]) [id number] Page 9 of 9 -------------------------------------------------------------------------------- Total Contact Cast Details Patient Name: Date of Service: [name] Mater [name]. 02/10/2023 11:00 [name] Medical Record Number: [id number] Patient Account Number: [id number] Date of Birth/Sex: Treating RN: January 07, 1946 (77 y.o. [name] [name] Primary Care Provider: [name], [name] Other Clinician: Referring Provider: Treating Provider/Extender: [name] [name] [name], [name]: 7 T Contact Cast Applied for Wound Assessment: otal Wound #1 Right Metatarsal head fifth Performed By: Physician [name] Shan, DO Post Procedure Diagnosis Same as Pre-procedure Electronic Signature(s) Signed: 02/10/2023 12:29:31 PM By: [name] Shan DO Signed: 02/11/2023 6:07:49 PM By: [name] Pilling RN, BSN Entered By: [name] [name] on 02/10/2023 11:11:47 -------------------------------------------------------------------------------- [name] Details Patient Name: Date of Service: SPA Kameren Pargas Priest [name]. 02/10/2023 Medical Record Number: [id number] Patient Account Number: [id number] Date of Birth/Sex: Treating RN: November 10, 1946 (76 y.o. 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 C61 Malignant neoplasm of prostate E11.621 Type 2 diabetes mellitus with foot  ulcer L97.512 Non-pressure chronic ulcer of other part of right foot with fat layer exposed Facility Procedures : CPT4 Code: [id number] Description: [id number] - DEBRIDE WOUND 1ST 20 SQ CM OR < ICD-10 Diagnosis Description 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 Modifier: Quantity: 1 Physician Procedures : CPT4 Code Description Modifier [id number] - WC PHYS DEBR WO ANESTH 20 SQ CM ICD-10 Diagnosis Description 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 Quantity: 1 Electronic Signature(s) Signed: 02/10/2023 3:46:53 PM By: [name] Shan DO Previous Signature: 02/10/2023 12:29:31 PM Version By: [name] Shan DO Entered By: [name] [name] on 02/10/2023 12:51:22

## 2023-02-12 NOTE — Progress Notes (Addendum)
[name], [name] ([id number] Page 1 of 2 Visit Report for 02/11/2023 HBO Details Patient Name: Date of Service: [name]. 02/11/2023 8:00 A M Medical Record Number: [id number] Patient Account Number: [id number] Date of Birth/Sex: Treating RN: December 28, 1945 (77 y.o. [name] Primary Care [name]: [name], [name]: Treating [name]/Extender: [name], [name] [name], [name] Weeks in Treatment: 7 HBO Treatment Course Details Treatment Course Number: 1 Ordering [name]: [name] T Treatments Ordered: otal 40 HBO Treatment Start Date: 01/05/2023 HBO Indication: Late Effect of Radiation HBO Treatment Details Treatment Number: 27 Patient Type: Outpatient Chamber Type: Monoplace Chamber Serial #: [id number] Treatment Protocol: 2.5 ATA with 90 minutes oxygen, with two 5 minute air breaks Treatment Details Compression Rate Down: 1.5 psi / minute De-Compression Rate Up: 2.0 psi / minute A breaks and breathing ir Compress Tx Pressure periods Decompress Decompress Begins Reached (leave unused spaces Begins Ends blank) Chamber Pressure (ATA 1 2.5 2.5 2.5 2.5 2.5 - - 2.5 1 ) Clock Time (24 hr) 07:53 08:07 08:37 08:42 09:12 09:17 - - 09:47 09:56 Treatment Length: 123 (minutes) Treatment Segments: 4 Vital Signs Capillary [name] Reference Range: 80 - 120 mg / dl HBO Diabetic [name] Intervention Range: <131 mg/dl or >249 mg/dl Time Vitals [name] Respiratory Capillary [name] Pulse Action Type: Pulse: Temperature: Taken: Pressure: Rate: Glucose (mg/dl): Meter #: Oximetry (%) Taken: Pre 07:44 122/68 74 18 98.2 211 Post 10:05 148/86 68 18 98.6 175 Treatment Response Treatment Toleration: Well Treatment Completion Status: Treatment Completed without Adverse Event Electronic Signature(s) Signed: 02/11/2023 11:33:56 AM By: [name] EMT Signed: 02/11/2023  3:56:54 PM By: [name] Keeler PA-C Previous Signature: 02/11/2023 10:51:45 AM Version By: [name] , , Previous Signature: 02/11/2023 10:46:44 AM Version By: [name] , , Entered By: [name] on 02/11/2023 11:33:56 -------------------------------------------------------------------------------- HBO Safety Checklist Details Patient Name: Date of Service: Lavon [name], [name] 02/11/2023 8:00 A M Medical Record Number: [id number] Patient Account Number: [id number] Date of Birth/Sex: Treating RN: 1946/02/19 (77 y.o. [name] Primary Care [name]: [name], [name] Other Clinician: [name] Referring [name]: Treating [name]/Extender: [name], [name] [name], [name] Weeks in Treatment: 7 HBO Safety Checklist Items Safety Checklist Consent Form Signed Patient voided / foley secured and emptied When did you last eato 0630 Last dose of injectable or oral agent [name], [name] L ([id number] Page 2 of 2 Last dose of injectable or oral agent 0640 Ostomy pouch emptied and vented if applicable NA All implantable devices assessed, documented and approved Bard PowerPort ClearVUE Intravenous access site secured and place NA Valuables secured Linens and cotton and cotton/polyester blend (less than 51% polyester) Personal oil-based products / skin lotions / body lotions removed Wigs or hairpieces removed NA Smoking or tobacco materials removed NA Books / newspapers / magazines / loose paper removed Cologne, aftershave, perfume and deodorant removed Jewelry removed (may wrap wedding band) Make-up removed NA Hair care products removed Battery operated devices (external) removed Heating patches and chemical warmers removed Titanium eyewear removed Nail polish cured greater than 10 hours NA Casting material cured greater than 10 hours Cast applied more than 10 hrs ago Hearing aids  removed NA Loose dentures or partials removed NA Prosthetics have been removed NA Patient demonstrates correct use of air break device (if applicable) Patient concerns have been addressed Patient grounding bracelet on and cord attached to chamber Specifics for Inpatients (complete  in addition to above) Medication sheet sent with patient NA Intravenous medications needed or due during therapy sent with patient NA Drainage tubes (e.g. nasogastric tube or chest tube secured and vented) NA Endotracheal or Tracheotomy tube secured NA Cuff deflated of air and inflated with saline NA Airway suctioned NA Notes Paper version used prior to treatment start. Electronic Signature(s) Signed: 02/11/2023 10:45:25 AM By: [name] , , Entered By: [name] on 02/11/2023 10:45:24

## 2023-02-12 NOTE — Progress Notes (Signed)
JAMIESON, CONCHA ([id number].pdf Page 1 of 2 Visit Report for 02/11/2023 Arrival Information Details Patient Name: Date of Service: [name] 02/11/2023 8:00 A M Medical Record Number: [id number] Patient Account Number: [id number] Date of Birth/Sex: Treating RN: 06-May-1946 (77 y.o. [name] Primary Care Edell Mesenbrink: [name], [name] Other Clinician: [name] Referring Dois Juarbe: Treating Jontae Sonier/Extender: [name], [name] [name], [name] in Treatment: 7 Visit Information History Since Last Visit All ordered tests and consults were completed: Yes Patient Arrived: [name] Added or deleted any medications: No Arrival Time: 07:20 Any new allergies or adverse reactions: No Accompanied By: self Had a fall or experienced change in No Transfer Assistance: None activities of daily living that may affect Patient Identification Verified: Yes risk of falls: Secondary Verification Process Completed: Yes Signs or symptoms of abuse/neglect since last visito No Patient Requires Transmission-Based No Hospitalized since last visit: No Precautions: Implantable device outside of the clinic excluding No Patient Has Alerts: Yes cellular tissue based products placed in the center Patient Alerts: Patient on Blood Thinner since last visit: LTBI: 0.88 09/25/2021 Has Dressing in Place as Prescribed: Yes VVS Has Footwear/Offloading in Place as Prescribed: Yes Left: T Contact Cast otal Pain Present Now: No Electronic Signature(s) Signed: 02/11/2023 10:41:57 AM By: [name] , , Entered By: [name] on 02/11/2023 10:41:57 -------------------------------------------------------------------------------- Encounter Discharge Information Details Patient Name: Date of Service: [name], [name] 02/11/2023 8:00 A M Medical Record Number: [id number] Patient Account Number: [id number] Date of Birth/Sex:  Treating RN: [birth year] (77 y.o. [name] Primary Care Awab Abebe: [name], [name] Other Clinician: Valeria Batman Referring Cavan Bearden: Treating Alexarae Oliva/Extender: [name], [name] [name], [name] in Treatment: 7 Encounter Discharge Information Items Discharge Condition: Stable Ambulatory Status: Walker Discharge Destination: Home Transportation: Private Auto Accompanied By: None Schedule Follow-up Appointment: Yes Clinical Summary of Care: Electronic Signature(s) Signed: 02/11/2023 11:35:16 AM By: Valeria Batman EMT Entered By: Valeria Batman on 02/11/2023 11:35:15 -------------------------------------------------------------------------------- Vitals Details Patient Name: Date of Service: [name], [name] 02/11/2023 8:00 A M Medical Record Number: [id number] Patient Account Number: [id number] Date of Birth/Sex: Treating RN: November 13, 1946 (77 y.o. [name] Primary Care Tarence Searcy: [name], [name] Other Clinician: [name] Referring Aliannah Holstrom: Treating Osmara Drummonds/Extender: [name], [name] [name], [name] in Treatment: [address] [place] ([id number]) [phone].pdf Page 2 of 2 Vital Signs Time Taken: 07:44 Temperature (F): 98.2 Height (in): 71 Pulse (bpm): 74 Weight (lbs): 252 Respiratory Rate (breaths/min): 18 Body Mass Index (BMI): 35.1 Blood Pressure (mmHg): 122/68 Capillary Blood Glucose (mg/dl): 211 Reference Range: 80 - 120 mg / dl Electronic Signature(s) Signed: 02/11/2023 10:43:28 AM By: [name] , , Entered By: [name] on 02/11/2023 10:43:27

## 2023-02-13 ENCOUNTER — Encounter (HOSPITAL_BASED_OUTPATIENT_CLINIC_OR_DEPARTMENT_OTHER): Payer: Medicare Other | Admitting: Internal Medicine

## 2023-02-16 ENCOUNTER — Encounter (HOSPITAL_BASED_OUTPATIENT_CLINIC_OR_DEPARTMENT_OTHER): Payer: Medicare Other | Admitting: Internal Medicine

## 2023-02-16 ENCOUNTER — Encounter (HOSPITAL_BASED_OUTPATIENT_CLINIC_OR_DEPARTMENT_OTHER): Payer: Medicare Other | Attending: Internal Medicine | Admitting: Internal Medicine

## 2023-02-16 DIAGNOSIS — Z794 Long term (current) use of insulin: Secondary | ICD-10-CM | POA: Insufficient documentation

## 2023-02-16 DIAGNOSIS — E11621 Type 2 diabetes mellitus with foot ulcer: Secondary | ICD-10-CM | POA: Diagnosis not present

## 2023-02-16 DIAGNOSIS — Z87891 Personal history of nicotine dependence: Secondary | ICD-10-CM | POA: Insufficient documentation

## 2023-02-16 DIAGNOSIS — Z923 Personal history of irradiation: Secondary | ICD-10-CM | POA: Insufficient documentation

## 2023-02-16 DIAGNOSIS — N3041 Irradiation cystitis with hematuria: Secondary | ICD-10-CM

## 2023-02-16 DIAGNOSIS — N189 Chronic kidney disease, unspecified: Secondary | ICD-10-CM | POA: Insufficient documentation

## 2023-02-16 DIAGNOSIS — E1121 Type 2 diabetes mellitus with diabetic nephropathy: Secondary | ICD-10-CM | POA: Diagnosis not present

## 2023-02-16 DIAGNOSIS — L97512 Non-pressure chronic ulcer of other part of right foot with fat layer exposed: Secondary | ICD-10-CM | POA: Diagnosis not present

## 2023-02-16 DIAGNOSIS — E1122 Type 2 diabetes mellitus with diabetic chronic kidney disease: Secondary | ICD-10-CM | POA: Diagnosis not present

## 2023-02-16 DIAGNOSIS — Z8546 Personal history of malignant neoplasm of prostate: Secondary | ICD-10-CM | POA: Diagnosis not present

## 2023-02-16 DIAGNOSIS — I129 Hypertensive chronic kidney disease with stage 1 through stage 4 chronic kidney disease, or unspecified chronic kidney disease: Secondary | ICD-10-CM | POA: Insufficient documentation

## 2023-02-16 DIAGNOSIS — Z8507 Personal history of malignant neoplasm of pancreas: Secondary | ICD-10-CM | POA: Insufficient documentation

## 2023-02-17 ENCOUNTER — Encounter (HOSPITAL_BASED_OUTPATIENT_CLINIC_OR_DEPARTMENT_OTHER): Payer: Medicare Other | Admitting: Internal Medicine

## 2023-02-17 ENCOUNTER — Ambulatory Visit (INDEPENDENT_AMBULATORY_CARE_PROVIDER_SITE_OTHER): Payer: Medicare Other | Admitting: Cardiology

## 2023-02-17 ENCOUNTER — Encounter (HOSPITAL_BASED_OUTPATIENT_CLINIC_OR_DEPARTMENT_OTHER): Payer: Self-pay | Admitting: Cardiology

## 2023-02-17 VITALS — BP 130/60 | HR 86 | Ht 71.0 in | Wt 258.0 lb

## 2023-02-17 DIAGNOSIS — L97512 Non-pressure chronic ulcer of other part of right foot with fat layer exposed: Secondary | ICD-10-CM | POA: Diagnosis not present

## 2023-02-17 DIAGNOSIS — I480 Paroxysmal atrial fibrillation: Secondary | ICD-10-CM

## 2023-02-17 DIAGNOSIS — E118 Type 2 diabetes mellitus with unspecified complications: Secondary | ICD-10-CM

## 2023-02-17 DIAGNOSIS — E11621 Type 2 diabetes mellitus with foot ulcer: Secondary | ICD-10-CM

## 2023-02-17 DIAGNOSIS — Z794 Long term (current) use of insulin: Secondary | ICD-10-CM | POA: Diagnosis not present

## 2023-02-17 DIAGNOSIS — I10 Essential (primary) hypertension: Secondary | ICD-10-CM | POA: Diagnosis not present

## 2023-02-17 DIAGNOSIS — N1831 Chronic kidney disease, stage 3a: Secondary | ICD-10-CM | POA: Diagnosis not present

## 2023-02-17 DIAGNOSIS — I129 Hypertensive chronic kidney disease with stage 1 through stage 4 chronic kidney disease, or unspecified chronic kidney disease: Secondary | ICD-10-CM | POA: Diagnosis not present

## 2023-02-17 DIAGNOSIS — I2510 Atherosclerotic heart disease of native coronary artery without angina pectoris: Secondary | ICD-10-CM | POA: Diagnosis not present

## 2023-02-17 DIAGNOSIS — E1121 Type 2 diabetes mellitus with diabetic nephropathy: Secondary | ICD-10-CM | POA: Diagnosis not present

## 2023-02-17 DIAGNOSIS — N3041 Irradiation cystitis with hematuria: Secondary | ICD-10-CM | POA: Diagnosis not present

## 2023-02-17 DIAGNOSIS — E1122 Type 2 diabetes mellitus with diabetic chronic kidney disease: Secondary | ICD-10-CM | POA: Diagnosis not present

## 2023-02-17 DIAGNOSIS — Z951 Presence of aortocoronary bypass graft: Secondary | ICD-10-CM | POA: Diagnosis not present

## 2023-02-17 DIAGNOSIS — C61 Malignant neoplasm of prostate: Secondary | ICD-10-CM | POA: Diagnosis not present

## 2023-02-17 DIAGNOSIS — N189 Chronic kidney disease, unspecified: Secondary | ICD-10-CM | POA: Diagnosis not present

## 2023-02-17 LAB — GLUCOSE, CAPILLARY
Glucose-Capillary: 166 mg/dL — ABNORMAL HIGH (ref 70–99)
Glucose-Capillary: 237 mg/dL — ABNORMAL HIGH (ref 70–99)

## 2023-02-17 NOTE — Progress Notes (Signed)
[name], [name] ([id number]) [id number] Page 1 of 7 Visit Report for 02/16/2023 Chief Complaint Document Details Patient Name: Date of Service: [name] 02/16/2023 1:15 PM Medical Record Number: [id number] Patient Account Number: [id number] Date of Birth/Sex: Treating RN: 11-27-1946 (77 y.o. 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 02/16/2023 1:15 PM Medical Record Number: [id number] Patient Account Number: [id number] Date of Birth/Sex: Treating RN: 03/09/1946 (77 y.o. M) Primary Care Provider: [name], [name] Other Clinician: Referring Provider: Treating Provider/Extender: Yanai Hobson [name], [name] in Treatment: 8 History of Present Illness HPI Description: 12/22/2022 [name] is Bill 77 year old male with Bill past medical history of insulin-dependent controlled type 2 diabetes with last hemoglobin A1c of 6.8 on 12/11/2019, primary cancer of body of pancreas, prostate cancer status post radiation, essential hypertension and CABG that presents to the clinic for discussion of HBO therapy in the setting of radiation cystitis. For his prostate cancer patient was started on Bill ADT on 09/17/2012, and radiation therapy to Bill dose of 78 GY completed on 01/21/2013. Lupron #8/8 completed on 06/23/2014. He has described urinary urgency and frequency over the past several years.  He follows with urology last seen on 11/21/2022 and was referred to [place] for HBO therapy. During that visit he described symptoms of urinary urgency/frequency/weak stream along with gross hematuria. He had Bill cystoscopy on 03/04/2022 that showed evidence of radiation cystitis changes with some increased neovascularity along the posterior bladder and trigone with no suspicious area concerning for CIS or papillary lesions. He reports 2 episodes of gross hematuria over the past year. He currently denies signs of infection. 01/22/2023; patient presents for Bill right foot wound. He states the wound started out as Bill callus then opened and has remained this way for Bill year. He has been using antibiotic ointment to the area. He is not aggressively offloading this. He currently denies signs of infection. He is currently in HBO therapy for radiation cystitis and has been tolerating this well. 2/12; patient presents for follow-up at the right foot wound. He is agreeable with the cast placement today. 01-28-2023 upon evaluation today patient appears today for reevaluation here in the clinic. This is Bill wound currently on his foot which is being managed by Dr. Heber [place]. With that being said it appears to be doing decently well today he had his first cast placed on Monday today he is here for his obligatory first cast change. Good news is nothing appears to be rubbing or causing any detriment anywhere on the leg at this point. 2/19; patient presents for follow-up. We have been doing Bill total contact cast to the right lower extremity with endoform. He has tolerated this well. 2/27; patient presents for follow-up. He has tolerated the total contact cast well. His wound is almost healed. Patient has completed 26 out of 40 HBO treatment sessions. He reports Bill decrease in urinary frequency and urgency. He feels like the HBO treatments are helping him. He has not mentioned any further UTIs. 3/4; patient presents for follow-up. He had  the total contact cast in place over  the past week. He states that the bottom is cracked. This was taken off today and wound is healed. Electronic Signature(s) Signed: 02/16/2023 3:19:32 PM By: [name] DO Entered By: [name] on 02/16/2023 14:10:59 Physical Exam Details -------------------------------------------------------------------------------- [name] ([id number] Page 2 of 7 Patient Name: Date of Service: [name] 02/16/2023 1:15 PM Medical Record Number: [id number] Patient Account Number: [id number] Date of Birth/Sex: Treating RN: 1946-09-25 (77 y.o. M) Primary Care Provider: [name], [name] Other Clinician: Referring Provider: Treating Provider/Extender: Rheanna Sergent [name], [name] in Treatment: 8 Constitutional respirations regular, non-labored and within target range for patient.. Cardiovascular 2+ dorsalis pedis/posterior tibialis pulses. Psychiatric pleasant and cooperative. Notes Right foot: T the fifth met head there is epithelialization. No signs of infection. o Electronic Signature(s) Signed: 02/16/2023 3:19:32 PM By: [name] DO Entered By: [name] on 02/16/2023 14:13:18 -------------------------------------------------------------------------------- Physician Orders Details Patient Name: Date of Service: SPA Dominica Severin, [name] 02/16/2023 1:15 PM Medical Record Number: [id number] Patient Account Number: [id number] Date of Birth/Sex: Treating RN: [birth year] (77 y.o. 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 continue to closely monitor the feet daily. PegAssist in surgical shoe, wear x1 week. Electronic Signature(s) Signed: 02/16/2023 3:19:32 PM By: [name] DO Entered By: [name] on 02/16/2023 14:13:52 -------------------------------------------------------------------------------- Problem List Details Patient Name: Date of Service: [facility] [name]. 02/16/2023 1:15 PM Medical Record Number: [id number] Patient Account Number: [id number] Date of Birth/Sex: Treating RN: 03/05/1946 (77 y.o. 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 02/16/2023 1:15 PM Medical Record Number: [id number] Patient Account Number: [id number] Date of Birth/Sex: Treating  RN: 08/26/1946 (77 y.o. M) Primary Care Provider: [name], [name] Other Clinician: Referring Provider: Treating Provider/Extender: Shavona Gunderman [name], [name] in Treatment: 8 Subjective Chief Complaint Information obtained from Patient 12/22/2022; referral for hyperbaric oxygen therapy in the setting of radiation cystitis, 2/8; right fifth met head plantar foot wound History of Present Illness (HPI) 12/22/2022 Mr. [place] Arace is Bill 77 year old male with Bill past medical history of insulin-dependent controlled type 2 diabetes with last hemoglobin A1c of 6.8 on 12/11/2019, primary cancer of body of pancreas, prostate cancer status post radiation, essential hypertension and CABG that presents to the clinic for discussion of HBO therapy  in the setting of radiation cystitis. For his prostate cancer patient was started on Bill ADT on 09/17/2012, and radiation therapy to Bill dose of 78 GY completed on 01/21/2013. Lupron #8/8 completed on 06/23/2014. He has described urinary urgency and frequency over the past several years. He follows with urology last seen on 11/21/2022 and was referred to [place] for HBO therapy. During that visit he described symptoms of urinary urgency/frequency/weak stream along with gross hematuria. He had Bill cystoscopy on 03/04/2022 that showed evidence of radiation cystitis changes with some increased neovascularity along the posterior bladder and trigone with no suspicious area concerning for CIS or papillary lesions. He reports 2 episodes of gross hematuria over the past year. He currently denies signs of infection. 01/22/2023; patient presents for Bill right foot wound. He states the wound started out as Bill callus then opened and has remained this way for Bill year. He has been using antibiotic ointment to the area. He is not aggressively offloading this. He currently denies signs of infection. He is currently in HBO therapy for radiation cystitis and has been tolerating this well. 2/12;  patient presents for follow-up at the right foot wound. He is agreeable with the cast placement today. 01-28-2023 upon evaluation today patient appears today for reevaluation here in the clinic. This is Bill wound currently on his foot which is being managed by Dr. Heber Santa Claus. With that being said it appears to be doing decently well today he had his first cast placed on Monday today he is here for his obligatory first cast change. Good news is nothing appears to be rubbing or causing any detriment anywhere on the leg at this point. 2/19; patient presents for follow-up. We have been doing Bill total contact cast to the right lower extremity with endoform. He has tolerated this well. 2/27; patient presents for follow-up. He has tolerated the total contact cast well. His wound is almost healed. Patient has completed 26 out of [place] Nuckles, Kairen [name] ([id number]) [phone].pdf Page 4 of 7 treatment sessions. He reports Bill decrease in urinary frequency and urgency. He feels like the HBO treatments are helping him. He has not mentioned any further UTIs. 3/4; patient presents for follow-up. He had the total contact cast in place over the past week. He states that the bottom is cracked. This was taken off today and wound is healed. Patient History Information obtained from Patient, Chart. Family History Unknown History. Social History Former smoker - quit in 1976, Marital Status - Married, Alcohol Use - Never, Drug Use - No History, Caffeine Use - Rarely. Medical History Hematologic/Lymphatic Patient has history of Anemia Respiratory Patient has history of Sleep Apnea - uses CPAP Cardiovascular Patient has history of Arrhythmia - Bill fibb, Coronary Artery Disease, Hypertension, Myocardial Infarction Endocrine Patient has history of Type II Diabetes Musculoskeletal Patient has history of Osteoarthritis Neurologic Patient has history of Neuropathy Oncologic Patient has history of  Received Chemotherapy - 2022- Pancreatic Ca with chemo, Received Radiation - 10-12 years ago for prostate Ca Psychiatric Denies history of Confinement Anxiety Hospitalization/Surgery History - EUS. - Biopsy. - CA bypass graft. - cholecystectomy. - tonsillectomy. - appendectomy. - triple bypass. Medical Bill Surgical History Notes [name] Cardiovascular hyperlipidemia Gastrointestinal fatty liver; GERD Genitourinary radiation cystitis, CKD, blood in urine Oncologic hx prostate ca Psychiatric PTSD Objective Constitutional respirations regular, non-labored and within target range for patient.. Vitals Time Taken: 1:15 PM, Height: 71 in, Weight: 252 lbs, BMI: 35.1, Temperature: 98.1 F, Pulse: 64 bpm, Respiratory Rate:  18 breaths/min, Blood Pressure: 106/68 mmHg, Capillary Blood Glucose: 97 mg/dl. Cardiovascular 2+ dorsalis pedis/posterior tibialis pulses. Psychiatric pleasant and cooperative. General Notes: Right foot: T the fifth met head there is epithelialization. No signs of infection. o Integumentary (Hair, Skin) Wound #1 status is Healed - Epithelialized. Original cause of wound was Gradually Appeared. The date acquired was: 01/22/2022. The wound has been in treatment 3 weeks. The wound is located on the Right Metatarsal head fifth. The wound measures 0cm length x 0cm width x 0cm depth; 0cm^2 area and 0cm^3 volume. There is Fat Layer (Subcutaneous Tissue) exposed. There is no tunneling or undermining noted. There is Bill medium amount of serosanguineous drainage noted. The wound margin is distinct with the outline attached to the wound base. There is large (67-100%) granulation within the wound bed. There is no necrotic tissue within the wound bed. The periwound skin appearance exhibited: Callus, Dry/Scaly. The periwound skin appearance did not exhibit: Crepitus, Excoriation, Induration, Rash, Scarring, Maceration, Atrophie Blanche, Cyanosis, Ecchymosis, Hemosiderin Staining, Mottled, Pallor,  Rubor, Erythema. Assessment ARKEL, CANCELLIERE ([id number]) [id number] Page 5 of 7 Active Problems ICD-10 Irradiation cystitis with hematuria Essential (primary) hypertension Type 2 diabetes mellitus with diabetic nephropathy Malignant neoplasm of prostate Type 2 diabetes mellitus with foot ulcer Non-pressure chronic ulcer of other part of right foot with fat layer exposed Patient has done well with Bill total contact cast. His wound is healed. I recommended using Bill surgical shoe with peg assist insert for the next week to help with offloading. He has not done HBO therapy this week for his radiation cystitis as he has developed Bill UTI. He states he is going to follow with urology for this issue. Plan Follow-up Appointments: Other: - Continue hyperbarics tomorrow 02/16/2023. continue to closely monitor the feet daily. PegAssist in surgical shoe, wear x1 week. 1. Offloadingoosurgical shoe with peg assist 2. Continue hyperbaric oxygen therapy Electronic Signature(s) Signed: 02/16/2023 3:19:32 PM By: [name] DO Entered By: [name] on 02/16/2023 14:14:50 -------------------------------------------------------------------------------- [name] Details Patient Name: Date of Service: SPA Dominica Severin, [name] 02/16/2023 1:15 PM Medical Record Number: [id number] Patient Account Number: [id number] Date of Birth/Sex: Treating RN: [birth year] (77 y.o. M) Primary Care Provider: [name], [name] Other Clinician: Referring Provider: Treating Provider/Extender: Catalyna Reilly [name], [name] in Treatment: 8 Information Obtained From Patient Chart Hematologic/Lymphatic Medical History: Positive for: Anemia Respiratory Medical History: Positive for: Sleep Apnea - uses CPAP Cardiovascular Medical History: Positive for: Arrhythmia - Bill fibb; Coronary Artery Disease; Hypertension; Myocardial Infarction Past Medical History  Notes: hyperlipidemia Gastrointestinal Medical History: Past Medical History Notes: fatty liver; GERD Endocrine Medical History: Positive for: Type II Diabetes Time with diabetes: 10 years Treated with: Insulin Blood sugar tested every day: Yes Tested : TID [name], [name] ([id number]) [phone].pdf Page 6 of 7 Genitourinary Medical History: Past Medical History Notes: radiation cystitis, CKD, blood in urine Musculoskeletal Medical History: Positive for: Osteoarthritis Neurologic Medical History: Positive for: Neuropathy Oncologic Medical History: Positive for: Received Chemotherapy - 2022- Pancreatic Ca with chemo; Received Radiation - 10-12 years ago for prostate Ca Past Medical History Notes: hx prostate ca Psychiatric Medical History: Negative for: Confinement Anxiety Past Medical History Notes: PTSD Immunizations Pneumococcal Vaccine: Received Pneumococcal Vaccination: Yes Received Pneumococcal Vaccination On or After 60th Birthday: Yes Implantable Devices None Hospitalization / Surgery History Type of Hospitalization/Surgery EUS Biopsy CA bypass graft cholecystectomy tonsillectomy appendectomy triple bypass Family and Social History Unknown History: Yes; Former smoker - quit in 1976; Marital Status - Married;  Alcohol Use: Never; Drug Use: No History; Caffeine Use: Rarely; Financial Concerns: No; Food, Clothing or Shelter Needs: No; Support System Lacking: No; Transportation Concerns: No Electronic Signature(s) Signed: 02/16/2023 3:19:32 PM By: [name] DO Entered By: [name] on 02/16/2023 14:11:05 -------------------------------------------------------------------------------- [name] Details Patient Name: Date of Service: SPA Dominica Severin, [name] 02/16/2023 Medical Record Number: [id number] Patient Account Number: [id number] Date of Birth/Sex: Treating RN: 1946/06/26 (77 y.o. 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 C61 Malignant neoplasm of prostate E11.621 Type 2 diabetes mellitus with foot ulcer L97.512 Non-pressure chronic ulcer of other part of right foot with fat layer exposed Facility Procedures : CPT4 Code: [id number] Description: 99213 - WOUND CARE VISIT-LEV 3 EST PT Modifier: Quantity: 1 Physician Procedures : CPT4 Code Description Modifier [id number] - WC PHYS LEVEL 3 - EST PT ICD-10 Diagnosis Description 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 N30.41  Irradiation cystitis with hematuria Quantity: 1 Electronic Signature(s) Signed: 02/16/2023 3:19:32 PM By: [name] DO Entered By: [name] on 02/16/2023 14:15:10

## 2023-02-17 NOTE — Patient Instructions (Signed)
Medication Instructions:  The current medical regimen is effective;  continue present plan and medications.  *If you need a refill on your cardiac medications before your next appointment, please call your pharmacy*   Lab Work: None  Testing/Procedures: None   Follow-Up: At [facility], you and your health needs are our priority.  As part of our continuing mission to provide you with exceptional heart care, we have created designated Provider Care Teams.  These Care Teams include your primary Cardiologist (physician) and Advanced Practice Providers (APPs -  Physician Assistants and Nurse Practitioners) who all work together to provide you with the care you need, when you need it.  We recommend signing up for the patient portal called "MyChart".  Sign up information is provided on this After Visit Summary.  MyChart is used to connect with patients for Virtual Visits (Telemedicine).  Patients are able to view lab/test results, encounter notes, upcoming appointments, etc.  Non-urgent messages can be sent to your provider as well.   To learn more about what you can do with MyChart, go to [email or web address].    Your next appointment:   6 month(s)  Provider:   Buford Dresser, MD    Other Instructions None

## 2023-02-17 NOTE — Progress Notes (Signed)
Cardiology Office Note:    Date:  02/17/2023   ID:  Muntasir, Bethune 07-Dec-1946, MRN [id number]  PCP:  [name], [name] G, MD  Cardiologist:  Buford Dresser, MD  Referring MD: [name], [name] G, MD   CC: follow up  History of Present Illness:    [name] is a 77 y.o. male with a hx of atrial fibrillation, myocardial infarction, coronary artery disease, hypertension, hyperlipidemia, dyspnea, sleep apnea, chronic kidney disease, diabetes mellitus, cancer, who is seen  for follow up today. I have not seen him previously  Today: Notes occasional low BP, about 4-5 times/month. Usually when he hasn't done well with drinking fluids. Has been lightheaded, no syncope. Very brief episodes.  Had a cast on his R foot until yesterday, now in a shoe. This limited his activity. Working now to gradually increase activity. Swelling in LE has improved. Using hyperbaric O2 chamber.  Denies chest pain, shortness of breath at rest. No PND, orthopnea, or unexpected weight gain. No syncope or palpitations.   Past Medical History:  Diagnosis Date   A-fib ([place]) 03/04/2021   Anemia    Arthritis    Cancer (HCC)    prostate   Chronic kidney disease    blood in urine    Constipation    Coronary artery disease    Depression    Diabetes mellitus without complication ([place])    Difficult intubation    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 it was hard to get the tube down his throat   Dyspnea    Family history of breast cancer    Fatty liver    Fatty liver    Frequent headaches    GERD (gastroesophageal reflux disease)    History of chicken pox    History of fainting spells of unknown cause    History of prostate cancer    Hyperlipidemia    Hypertension    Myocardial infarction [facility])    Peripheral neuropathy    Pneumonia    Prostate cancer ([place])    PTSD (post-traumatic stress disorder)    Sleep apnea    uses Cpap    Past Surgical History:  Procedure Laterality Date   APPENDECTOMY      BIOPSY  01/15/2021   Procedure: BIOPSY;  Surgeon: Irving Copas., MD;  Location: [place];  Service: Gastroenterology;;   CARDIAC CATHETERIZATION  06/26/2017   CARDIAC SURGERY     Triple Bypass   CHOLECYSTECTOMY  2010   COLONOSCOPY     CORONARY ARTERY BYPASS GRAFT  2004   ESOPHAGOGASTRODUODENOSCOPY (EGD) WITH PROPOFOL N/A 01/15/2021   Procedure: ESOPHAGOGASTRODUODENOSCOPY (EGD) WITH PROPOFOL;  Surgeon: Irving Copas., MD;  Location: [facility] ENDOSCOPY;  Service: Gastroenterology;  Laterality: N/A;   EUS N/A 01/15/2021   Procedure: UPPER ENDOSCOPIC ULTRASOUND (EUS) RADIAL;  Surgeon: Irving Copas., MD;  Location: [place];  Service: Gastroenterology;  Laterality: N/A;   EYE SURGERY Bilateral    cataract removal   FINE NEEDLE ASPIRATION  01/15/2021   Procedure: FINE NEEDLE ASPIRATION (FNA) LINEAR;  Surgeon: Irving Copas., MD;  Location: [facility] ENDOSCOPY;  Service: Gastroenterology;;   LAPAROSCOPY N/A 07/01/2021   Procedure: STAGING LAPAROSCOPY;  Surgeon: Dwan Bolt, MD;  Location: [place];  Service: General;  Laterality: N/A;   PORTACATH PLACEMENT Right 02/21/2021   Procedure: INSERTION PORT-A-CATH;  Surgeon: Dwan Bolt, MD;  Location: [place];  Service: General;  Laterality: Right;   SPLENECTOMY, 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   Procedure: SPLENECTOMY;  Surgeon: Michaelle Birks  L, MD;  Location: [place];  Service: General;  Laterality: N/A;   TONSILLECTOMY  1958    Current Medications: Current Outpatient Medications on File Prior to Visit  Medication Sig   amLODipine (NORVASC) 5 MG tablet Take 1 tablet (5 mg total) by mouth daily.   apixaban (ELIQUIS) 5 MG TABS tablet Take 5 mg by mouth 2 (two) times daily.   atorvastatin (LIPITOR) 80 MG tablet Take 40 mg by mouth at bedtime.   calcium-vitamin D (OSCAL WITH D) 500-200 MG-UNIT tablet Take 1 tablet by mouth 2 (two) times daily.   ciclopirox (PENLAC) 8 % solution Apply topically at bedtime. Apply over nail and  surrounding skin. Apply daily over previous coat. After seven (7) days, may remove with alcohol and continue cycle.   clotrimazole-betamethasone (LOTRISONE) cream Apply 1 application topically daily as needed.   CRANBERRY PO Take by mouth.   cycloSPORINE (RESTASIS) 0.05 % ophthalmic emulsion Place 1 drop into both eyes 2 (two) times daily as needed (dry eyes).   ferrous sulfate 325 (65 FE) MG tablet Take 1 tablet (325 mg total) by mouth daily with breakfast.   FLUoxetine (PROZAC) 20 MG capsule Take 40 mg by mouth daily.   fluticasone (FLONASE) 50 MCG/ACT nasal spray Place 1 spray into both nostrils daily.   furosemide (LASIX) 20 MG tablet Take 1 tablet (20 mg total) by mouth daily.   glucose blood (ONETOUCH VERIO) test strip Use to test blood sugars 1-2 times daily.   hydrALAZINE (APRESOLINE) 25 MG tablet Take 1 tablet (25 mg total) by mouth 3 (three) times daily.   insulin glargine (LANTUS) 100 UNIT/ML injection Inject 0.45 mLs (45 Units total) into the skin daily.   insulin lispro (HUMALOG) 100 UNIT/ML injection Per sliding scale.   lidocaine-prilocaine (EMLA) cream Apply 1 application  topically as needed.   loratadine (CLARITIN) 10 MG tablet Take 10 mg by mouth daily as needed.   Multiple Vitamin (MULTI-VITAMINS) TABS Take 1 tablet by mouth daily.   nitroGLYCERIN (NITROSTAT) 0.3 MG SL tablet Place 0.3 mg under the tongue every 5 (five) minutes as needed for chest pain.   nystatin powder Apply 1 application topically 3 (three) times daily.   pantoprazole (PROTONIX) 40 MG tablet Take 1 tablet (40 mg total) by mouth daily.   Probiotic Product (PROBIOTIC ADVANCED PO) Take 1 capsule by mouth at bedtime.   tamsulosin (FLOMAX) 0.4 MG CAPS capsule Take 0.8 mg by mouth at bedtime.   traZODone (DESYREL) 100 MG tablet Take 100 mg by mouth at bedtime.   valsartan (DIOVAN) 160 MG tablet Take 0.5 tablets (80 mg total) by mouth daily.   vitamin B-12 (CYANOCOBALAMIN) 1000 MCG tablet Take 1,000 mcg by mouth  daily.   Wheat Dextrin (BENEFIBER DRINK MIX PO) Take by mouth daily.   acetaminophen (TYLENOL) 500 MG tablet Take 1,000 mg by mouth 2 (two) times daily.   No current facility-administered medications on file prior to visit.     Allergies:   Keflex [cephalexin], Lisinopril, and Terazosin   Social History   Tobacco Use   Smoking status: Former    Types: Cigarettes    Quit date: 12/14/1975    Years since quitting: 47.2   Smokeless tobacco: Never  Substance Use Topics   Alcohol use: Not Currently   Drug use: Never    Family History: family history includes Alcohol abuse in his brother; Arthritis in his mother and sister; Breast cancer in his sister; COPD in his brother; Cancer in his brother  and sister; Depression in his daughter; Heart attack in his brother, brother, and mother; Heart disease in his brother, brother, and mother; Hyperlipidemia in his mother; Hypertension in his mother; Learning disabilities in his brother.  ROS:   Please see the history of present illness.   Additional pertinent ROS otherwise negative.   EKGs/Labs/Other Studies Reviewed:    The following studies were reviewed today:  CT Abdomen Pelvis 02/06/2022: IMPRESSION: 1. Acute interstitial edematous pancreatitis with small 1.6 cm acute peripancreatic fluid collection within the superior aspect of the pancreatic head. 2. 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 3. Stable benign left adrenal adenoma. 4. Prostatomegaly. 5.  Aortic Atherosclerosis (ICD10-I70.0).   RLE Venous Doppler 09/24/2021: Summary:  RIGHT:  - There is no evidence of deep vein thrombosis in the lower extremity.     - No cystic structure found in the popliteal fossa.     LEFT:  - No evidence of common femoral vein obstruction.    Echo 03/04/2021: 1. Left ventricular ejection fraction, by estimation, is 60 to 65%. The  left ventricle has normal function. The left ventricle has no regional  wall  motion abnormalities. Left ventricular diastolic parameters are  indeterminate.   2. Right ventricular systolic function is normal. The right ventricular  size is normal. There is normal pulmonary artery systolic pressure.   3. The mitral valve is normal in structure. No evidence of mitral valve  regurgitation.   4. The aortic valve is normal in structure. Aortic valve regurgitation is  not visualized.   5. The inferior vena cava is normal in size with greater than 50%  respiratory variability, suggesting right atrial pressure of 3 mmHg.    EKG:  EKG is personally reviewed.   02/17/23: not ordered today 09/08/22: not ordered today  Recent Labs: 01/23/2023: ALT 13; BUN 27; Creatinine 1.36; Hemoglobin 12.0; Platelet Count 247; Potassium 4.0; Sodium 136  Recent Lipid Panel    Component Value Date/Time   CHOL 141 02/21/2022 1156   CHOL 101 12/16/2018 1003   TRIG 140.0 02/21/2022 1156   HDL 46.80 02/21/2022 1156   HDL 38 (L) 12/16/2018 1003   CHOLHDL 3 02/21/2022 1156   VLDL 28.0 02/21/2022 1156   LDLCALC 66 02/21/2022 1156   LDLCALC 55 10/10/2020 0940    Physical Exam:    VS:  BP 130/60   Pulse 86   Ht '5\' 11"'$  (1.803 m)   Wt 258 lb (117 kg)   SpO2 95%   BMI 35.98 kg/m     Wt Readings from Last 3 Encounters:  02/17/23 258 lb (117 kg)  01/23/23 262 lb (118.8 kg)  12/31/22 257 lb 6.4 oz (116.8 kg)    GEN: Well nourished, well developed in no acute distress HEENT: Normal, moist mucous membranes NECK: No JVD CARDIAC: regular rhythm, normal S1 and S2, no rubs or gallops. No murmur. VASCULAR: Radial and DP pulses 2+ bilaterally. No carotid bruits RESPIRATORY:  Clear to auscultation without rales, wheezing or rhonchi  ABDOMEN: Soft, non-tender, non-distended MUSCULOSKELETAL:  Ambulates independently, R foot in orthopedic shoe SKIN: Warm and dry, trivial LE edema NEUROLOGIC:  Alert and oriented x 3. No focal neuro deficits noted. PSYCHIATRIC:  Normal affect    ASSESSMENT:     1. Essential hypertension   2. Coronary artery disease involving native coronary artery of native heart without angina pectoris   3. S/P CABG (coronary artery bypass graft)   4. Paroxysmal atrial fibrillation (HCC)   5. Type 2 diabetes  mellitus with complication, with long-term current use of insulin (HCC)   6. Stage 3a chronic kidney disease (HCC)     PLAN:    Hypertension Chronic kidney disease stage 3a -at goal today -continue amlodipine 5 mg daily, furosemide 20 mg daily, hydralazine 25 mg three times daily -continue valsartan 160 mg daily -pending nephrology evaluation -GFR 53  CAD s/p prior CABG -no angina -continue atorvastatin 80 mg daily -no aspirin as he is on apixaban  Paroxysmal atrial fibrillation -continue apixaban -chadsvasc=5  Type II diabetes, on insulin -would consider SLGT2i or GLP1RA -A1c has been well controlled, but glucose on BMET runs high  Cardiac risk counseling and prevention recommendations: -recommend heart healthy/Mediterranean diet, with whole grains, fruits, vegetable, fish, lean meats, nuts, and olive oil. Limit salt. -recommend moderate walking, 3-5 times/week for 30-50 minutes each session. Aim for at least 150 minutes.week. Goal should be pace of 3 miles/hours, or walking 1.5 miles in 30 minutes -recommend avoidance of tobacco products. Avoid excess alcohol.  Plan for follow up: 6 months.  Buford Dresser, MD, PhD, [facility]    Medication Adjustments/Labs and Tests Ordered: Current medicines are reviewed at length with the patient today.  Concerns regarding medicines are outlined above.  No orders of the defined types were placed in this encounter.  No orders of the defined types were placed in this encounter.  Patient Instructions  Medication Instructions:  The current medical regimen is effective;  continue present plan and medications.  *If you need a refill on your cardiac medications before  your next appointment, please call your pharmacy*   Lab Work: None  Testing/Procedures: None   Follow-Up: At [facility], you and your health needs are our priority.  As part of our continuing mission to provide you with exceptional heart care, we have created designated Provider Care Teams.  These Care Teams include your primary Cardiologist (physician) and Advanced Practice Providers (APPs -  Physician Assistants and Nurse Practitioners) who all work together to provide you with the care you need, when you need it.  We recommend signing up for the patient portal called "MyChart".  Sign up information is provided on this After Visit Summary.  MyChart is used to connect with patients for Virtual Visits (Telemedicine).  Patients are able to view lab/test results, encounter notes, upcoming appointments, etc.  Non-urgent messages can be sent to your provider as well.   To learn more about what you can do with MyChart, go to [email or web address].    Your next appointment:   6 month(s)  Provider:   Buford Dresser, MD    Other Instructions None    Signed, Buford Dresser, MD PhD 02/17/2023     [place]

## 2023-02-18 ENCOUNTER — Encounter (HOSPITAL_BASED_OUTPATIENT_CLINIC_OR_DEPARTMENT_OTHER): Payer: Medicare Other | Admitting: Internal Medicine

## 2023-02-18 DIAGNOSIS — L97512 Non-pressure chronic ulcer of other part of right foot with fat layer exposed: Secondary | ICD-10-CM | POA: Diagnosis not present

## 2023-02-18 DIAGNOSIS — E1121 Type 2 diabetes mellitus with diabetic nephropathy: Secondary | ICD-10-CM | POA: Diagnosis not present

## 2023-02-18 DIAGNOSIS — E1122 Type 2 diabetes mellitus with diabetic chronic kidney disease: Secondary | ICD-10-CM | POA: Diagnosis not present

## 2023-02-18 DIAGNOSIS — L598 Other specified disorders of the skin and subcutaneous tissue related to radiation: Secondary | ICD-10-CM | POA: Diagnosis not present

## 2023-02-18 DIAGNOSIS — N189 Chronic kidney disease, unspecified: Secondary | ICD-10-CM | POA: Diagnosis not present

## 2023-02-18 DIAGNOSIS — I129 Hypertensive chronic kidney disease with stage 1 through stage 4 chronic kidney disease, or unspecified chronic kidney disease: Secondary | ICD-10-CM | POA: Diagnosis not present

## 2023-02-18 DIAGNOSIS — E11621 Type 2 diabetes mellitus with foot ulcer: Secondary | ICD-10-CM | POA: Diagnosis not present

## 2023-02-18 LAB — GLUCOSE, CAPILLARY
Glucose-Capillary: 205 mg/dL — ABNORMAL HIGH (ref 70–99)
Glucose-Capillary: 246 mg/dL — ABNORMAL HIGH (ref 70–99)

## 2023-02-18 NOTE — Progress Notes (Signed)
ANN, KINDSCHI ([id number]) [id number] Page 1 of 9 Visit Report for 02/02/2023 Chief Complaint Document Details Patient Name: Date of Service: [name] 02/02/2023 10:15 [name] Medical Record Number: [id number] Patient Account Number: [id number] Date of Birth/Sex: Treating RN: 19-Oct-1946 (77 y.o. [name]) Primary Care Provider: [name], [name] Other Clinician: Referring Provider: Treating Provider/Extender: [name] [name] Weeks in Treatment: 6 Information Obtained from: Patient Chief Complaint 12/22/2022; referral for hyperbaric oxygen therapy in the setting of radiation cystitis, 2/8; right fifth met head plantar foot wound Electronic Signature(s) Signed: 02/02/2023 3:39:58 PM By: [name] DO Entered By: [name] on 02/02/2023 10:54:32 -------------------------------------------------------------------------------- HPI Details Patient Name: Date of Service: [name] [name]. 02/02/2023 10:15 [name] Medical Record Number: [id number] Patient Account Number: [id number] Date of Birth/Sex: Treating RN: 03/13/1946 (76 y.o. [name]) Primary Care Provider: [name], [name] Other Clinician: Referring Provider: Treating Provider/Extender: [name] [name] Weeks in Treatment: 6 History of Present Illness HPI Description: 12/22/2022 [name] is Bill 77 year old male with Bill past medical history of insulin-dependent controlled type 2 diabetes with last hemoglobin A1c of 6.8 on 12/11/2019, primary cancer of body of pancreas, prostate cancer status post radiation, essential hypertension and CABG that presents to the clinic for discussion of HBO therapy in the setting of radiation cystitis. For his prostate cancer patient was started on Bill ADT on 09/17/2012, and radiation therapy to Bill dose of 78 GY completed on 01/21/2013. Lupron #8/8 completed on 06/23/2014. He has described urinary urgency and frequency over the past several  years. He follows with urology last seen on 11/21/2022 and was referred to [place] for HBO therapy. During that visit he described symptoms of urinary urgency/frequency/weak stream along with gross hematuria. He had Bill cystoscopy on 03/04/2022 that showed evidence of radiation cystitis changes with some increased neovascularity along the posterior bladder and trigone with no suspicious area concerning for CIS or papillary lesions. He reports 2 episodes of gross hematuria over the past year. He currently denies signs of infection. 01/22/2023; patient presents for Bill right foot wound. He states the wound started out as Bill callus then opened and has remained this way for Bill year. He has been using antibiotic ointment to the area. He is not aggressively offloading this. He currently denies signs of infection. He is currently in HBO therapy for radiation cystitis and has been tolerating this well. 2/12; patient presents for follow-up at the right foot wound. He is agreeable with the cast placement today. 01-28-2023 upon evaluation today patient appears today for reevaluation here in the clinic. This is Bill wound currently on his foot which is being managed by Dr. Heber Dania [name]. With that being said it appears to be doing decently well today he had his first cast placed on Monday today he is here for his obligatory first cast change. Good news is nothing appears to be rubbing or causing any detriment anywhere on the leg at this point. 2/19; patient presents for follow-up. We have been doing Bill total contact cast to the right lower extremity with endoform. He has tolerated this well. Electronic Signature(s) Signed: 02/02/2023 3:39:58 PM By: [name] DO Entered By: [name] on 02/02/2023 11:00:22 -------------------------------------------------------------------------------- Physical Exam Details Patient Name: Date of Service: [name] [name]. 02/02/2023 10:15 [name] Medical Record Number: [id number] Patient  Account Number: [id number] Date of Birth/Sex: Treating RN: 1946-09-06 (77 y.o. [name]) Primary Care Provider: [name], [name] Other Clinician: Referring Provider: Treating Provider/Extender: [name]  [name] [name], [name] Weeks in Treatment: 6 [name] ([id number]) [id number] Page 2 of 9 Constitutional respirations regular, non-labored and within target range for patient.. Cardiovascular 2+ dorsalis pedis/posterior tibialis pulses. Psychiatric pleasant and cooperative. Notes Right foot: Bill the fifth met head there is Bill small open wound with granulation tissue present. No signs of infection. o Electronic Signature(s) Signed: 02/02/2023 3:39:58 PM By: [name] DO Entered By: [name] on 02/02/2023 11:01:22 -------------------------------------------------------------------------------- Physician Orders Details Patient Name: Date of Service: [name], [name]. 02/02/2023 10:15 [name] Medical Record Number: [id number] Patient Account Number: [id number] Date of Birth/Sex: Treating RN: September 06, 1946 (77 y.o. 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 - Dr [name] Tuesday 02/10/23 after HBO Return Bill Bathing/ Shower/ Hygiene May shower with protection but do not get wound dressing(s) wet. Protect dressing(s) with water repellant cover (for example, large plastic bag) or Bill cast cover and may  then take shower. Off-Loading Total Contact Cast to Right Lower Extremity Removable cast walker boot to: Hyperbaric Oxygen Therapy Evaluate for HBO Therapy Indication: - Radiation Cystitis If appropriate for treatment, begin HBOT per protocol: 2.5 ATA for 90 Minutes with 2 Five (5) Minute Bill Breaks ir Total Number of Treatments: - 40 One treatments per day (delivered Monday through Friday unless otherwise specified in Special Instructions below): Finger stick [name] Pre- and Post- HBOT Treatment. Follow Hyperbaric Oxygen Glycemia Protocol Bill frin (Oxymetazoline HCL) 0.05% nasal spray - 1 spray in both nostrils daily as needed prior to HBO treatment for difficulty clearing ears Wound Treatment Wound #1 - Metatarsal head fifth Wound Laterality: Right Cleanser: Wound Cleanser 1 x Per Day/30 Days Discharge Instructions: Cleanse the wound with wound cleanser prior to applying Bill clean dressing using gauze sponges, not tissue or cotton balls. Peri-Wound Care: Skin Prep 1 x Per Day/30 Days Discharge Instructions: Use skin prep as directed Topical: Gentamicin 1 x Per Day/30 Days Discharge Instructions: As directed by physician Topical: Mupirocin Ointment 1 x Per Day/30 Days Servello, Maxtyn [name] ([id number]) [id number] Page 3 of 9 Discharge Instructions: Apply Mupirocin (Bactroban) as instructed Prim Dressing: Endoform 2x2 in 1 x Per Day/30 Days ary Discharge Instructions: Moisten with saline Secondary Dressing: Optifoam Non-Adhesive Dressing, 4x4 in 1 x Per Day/30 Days Discharge Instructions: foam donut Secondary Dressing: Woven Gauze Sponges 2x2 in 1 x Per Day/30 Days Discharge Instructions: Apply over primary dressing as directed. Secured With: Child psychotherapist, Sterile 2x75 (in/in) 1 x Per Day/30 Days Discharge Instructions: Secure with stretch gauze as directed. Patient Medications llergies: Keflex, lisinopril, terazosin Bill Notifications  Medication Indication Start End prior to debridement 02/02/2023 lidocaine DOSE topical 4 % cream - cream topical once daily GLYCEMIA INTERVENTIONS PROTOCOL PRE-HBO GLYCEMIA INTERVENTIONS ACTION INTERVENTION Obtain pre-HBO capillary [name] (ensure 1 physician order is in chart). Bill. Notify HBO physician and await physician orders. 2 If result is 70 mg/dl or below: B. If the result meets the hospital definition of Bill critical result, follow hospital policy. Bill. Give patient an 8 ounce Glucerna Shake, an 8 ounce  Ensure, or 8 ounces of Bill Glucerna/Ensure equivalent dietary supplement*. B. Wait 30 minutes. If result is 71 mg/dl to 130 mg/dl: C. Retest patients capillary [name] (CBG). D. If result greater than or equal to 110 mg/dl, proceed with HBO. If result less than 110 mg/dl, notify HBO physician and consider holding HBO. If result is 131 mg/dl to 249 mg/dl: Bill. Proceed with HBO. Bill. Notify HBO physician and await physician orders. B. It is recommended to hold HBO and do If result is 250 mg/dl or greater: [name]/urine ketone testing. C. If the result meets the hospital definition of Bill critical result, follow hospital policy. POST-HBO GLYCEMIA INTERVENTIONS ACTION INTERVENTION Obtain post HBO capillary [name] (ensure 1 physician order is in chart). Bill. Notify HBO physician and await physician orders. 2 If result is 70 mg/dl or below: B. If the result meets the hospital definition of Bill critical result, follow hospital policy. Bill. Give patient an 8 ounce Glucerna Shake, an 8 ounce Ensure, or 8 ounces of Bill Glucerna/Ensure equivalent dietary supplement*. B. Wait 15 minutes for symptoms of If result is 71 mg/dl to 100 mg/dl: hypoglycemia (i.e. nervousness, anxiety, sweating, chills, clamminess, irritability, confusion, tachycardia or dizziness). C. If patient asymptomatic, discharge patient. If patient symptomatic, repeat capillary [name] (CBG)  and notify HBO physician. If result is 101 mg/dl to 249 mg/dl: Bill. Discharge patient. Bill. Notify HBO physician and await physician orders. B. It is recommended to do [name]/urine ketone If result is 250 mg/dl or greater: testing. C. If the result meets the hospital definition of Bill critical result, follow hospital policy. *Juice or candies are NOT equivalent products. If patient refuses the Glucerna or Ensure, please consult the hospital dietitian for an appropriate substitute. Electronic Signature(s) Signed: 02/02/2023 3:39:58 PM By: [name] DO Signed: 02/02/2023 4:17:27 PM By: Sharyn Creamer RN, BSN [name], [name] ([id number]) By: Sharyn Creamer RN, BSN [phone].pdf Page 4 of 9 Signed: 02/02/2023 4:17:27 PM Entered By: Sharyn Creamer on 02/02/2023 11:19:53 -------------------------------------------------------------------------------- Problem List Details Patient Name: Date of Service: [name] 02/02/2023 10:15 [name] Medical Record Number: [id number] Patient Account Number: [id number] Date of Birth/Sex: Treating RN: 1946-03-20 (77 y.o. 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 02/02/2023 10:15 [name] Medical Record Number: [id number] Patient Account Number: [id number] Date of Birth/Sex: Treating RN: 06-Aug-1946 (77 y.o. [name]) Primary Care Provider: [name], [name] Other Clinician: Referring Provider: Treating Provider/Extender: Monish Haliburton [name] Weeks in Treatment: 6 Subjective Chief Complaint Information obtained from Patient 12/22/2022; referral for hyperbaric oxygen therapy in the setting of radiation cystitis, 2/8; right fifth met head plantar foot wound History of Present Illness (HPI) 12/22/2022 [name] is Bill 77 year old male with Bill past medical history of insulin-dependent controlled type 2 diabetes with last hemoglobin  A1c of 6.8 on [facility], Jamonta [name] ([id number]) [id number].pdf Page 5 of 9 12/11/2019, primary cancer of body of pancreas, prostate cancer status post radiation, essential hypertension and CABG that presents to the clinic for discussion of HBO therapy in the setting of radiation cystitis. For his prostate cancer patient was started on Bill ADT on 09/17/2012, and radiation therapy to Bill dose of 78 GY completed on 01/21/2013. Lupron #8/8 completed on 06/23/2014. He has described urinary urgency and frequency over the past several years. He follows with urology last seen on 11/21/2022 and was referred to [place] for HBO therapy. During that visit he described symptoms of urinary urgency/frequency/weak stream along with gross hematuria. He had Bill cystoscopy on 03/04/2022 that showed evidence of radiation cystitis changes with some increased neovascularity along the posterior bladder and trigone with no suspicious area concerning for CIS or papillary lesions. He reports 2 episodes of gross hematuria over the past year. He currently denies signs of  infection. 01/22/2023; patient presents for Bill right foot wound. He states the wound started out as Bill callus then opened and has remained this way for Bill year. He has been using antibiotic ointment to the area. He is not aggressively offloading this. He currently denies signs of infection. He is currently in HBO therapy for radiation cystitis and has been tolerating this well. 2/12; patient presents for follow-up at the right foot wound. He is agreeable with the cast placement today. 01-28-2023 upon evaluation today patient appears today for reevaluation here in the clinic. This is Bill wound currently on his foot which is being managed by Dr. Heber Tickfaw. With that being said it appears to be doing decently well today he had his first cast placed on Monday today he is here for his obligatory first cast change. Good news is nothing appears to be rubbing or causing any detriment anywhere on the leg at this point. 2/19; patient presents for follow-up. We have been doing Bill total contact cast to the right lower extremity with endoform. He has tolerated this well. Patient History Information obtained from Patient, Chart. Family History Unknown History. Social History Former smoker - quit in 1976, Marital Status - Married, Alcohol Use - Never, Drug Use - No History, Caffeine Use - Rarely. Medical History Hematologic/Lymphatic Patient has history of Anemia Respiratory Patient has history of Sleep Apnea - uses CPAP Cardiovascular Patient has history of Arrhythmia - Bill fibb, Coronary Artery Disease, Hypertension, Myocardial Infarction Endocrine Patient has history of Type II Diabetes Musculoskeletal Patient has history of Osteoarthritis Neurologic Patient has history of Neuropathy Oncologic Patient has history of Received Chemotherapy - 2022- Pancreatic Ca with chemo, Received Radiation - 10-12 years ago for prostate Ca Psychiatric Denies history of Confinement Anxiety Hospitalization/Surgery History -  EUS. - Biopsy. - CA bypass graft. - cholecystectomy. - tonsillectomy. - appendectomy. - triple bypass. Medical Bill Surgical History Notes nd Cardiovascular hyperlipidemia Gastrointestinal fatty liver; GERD Genitourinary radiation cystitis, CKD, [name] in urine Oncologic hx prostate ca Psychiatric PTSD Objective Constitutional respirations regular, non-labored and within target range for patient.. Vitals Time Taken: 1:25 AM, Height: 71 in, Weight: 252 lbs, BMI: 35.1. General Notes: see post VS from HBO. Cardiovascular 2+ dorsalis pedis/posterior tibialis pulses. Psychiatric pleasant and cooperative. General Notes: Right foot: Bill the fifth met head there is Bill small open wound with granulation tissue present. No signs of infection. o Integumentary (Hair, Skin) Wound #1 status is Open. Original cause of wound was Gradually Appeared. The date acquired was: 01/22/2022. The wound has  been in treatment 1 weeks. The [name], [name] ([id number]) [id number] Page 6 of 9 wound is located on the Right Metatarsal head fifth. The wound measures 0.1cm length x 0.1cm width x 0.1cm depth; 0.008cm^2 area and 0.001cm^3 volume. There is Fat Layer (Subcutaneous Tissue) exposed. There is no tunneling or undermining noted. There is Bill medium amount of serosanguineous drainage noted. The wound margin is distinct with the outline attached to the wound base. There is large (67-100%) red, pink, pale granulation within the wound bed. There is no necrotic tissue within the wound bed. The periwound skin appearance exhibited: Callus, Dry/Scaly. The periwound skin appearance did not exhibit: Crepitus, Excoriation, Induration, Rash, Scarring, Maceration, Atrophie Blanche, Cyanosis, Ecchymosis, Hemosiderin Staining, Mottled, Pallor, Rubor, Erythema. Assessment Active Problems ICD-10 Irradiation cystitis with hematuria Essential (primary) hypertension Type 2 diabetes mellitus with diabetic  nephropathy Malignant neoplasm of prostate Type 2 diabetes mellitus with foot ulcer Non-pressure chronic ulcer of other part of right foot with fat layer exposed Patient's wound appears well-healing. I recommended continuing the course with endoform and the total contact cast. Follow-up in 1 week. Procedures Wound #1 Pre-procedure diagnosis of Wound #1 is Bill Diabetic Wound/Ulcer of the Lower Extremity located on the Right Metatarsal head fifth . There was Bill Bill Interior and spatial designer Procedure by [name], DO. Post procedure Diagnosis Wound #1: Same as Pre-Procedure Plan Follow-up Appointments: Return Appointment in 1 week. - Dr Bill Chacra Tuesday 02/10/23 after HBO Bathing/ Shower/ Hygiene: May shower with protection but do not get wound dressing(s) wet. Protect dressing(s) with water repellant cover (for example, large plastic bag) or Bill cast cover and may then take shower. Off-Loading: Bill Contact Cast to Right Lower Extremity otal Removable cast walker boot to: Hyperbaric Oxygen Therapy: Evaluate for HBO Therapy Indication: - Radiation Cystitis If appropriate for treatment, begin HBOT per protocol: 2.5 ATA for 90 Minutes with 2 Five (5) Minute Air Breaks Bill Number of Treatments: - 40 otal One treatments per day (delivered Monday through Friday unless otherwise specified in Special Instructions below): Finger stick [name] Pre- and Post- HBOT Treatment. Follow Hyperbaric Oxygen Glycemia Protocol Afrin (Oxymetazoline HCL) 0.05% nasal spray - 1 spray in both nostrils daily as needed prior to HBO treatment for difficulty clearing ears The following medication(s) was prescribed: lidocaine topical 4 % cream cream topical once daily for prior to debridement was prescribed at facility WOUND #1: - Metatarsal head fifth Wound Laterality: Right Cleanser: Wound Cleanser 1 x Per Day/30 Days Discharge Instructions: Cleanse the wound with wound cleanser prior to applying Bill clean dressing using  gauze sponges, not tissue or cotton balls. Peri-Wound Care: Skin Prep 1 x Per Day/30 Days Discharge Instructions: Use skin prep as directed Topical: Gentamicin 1 x Per Day/30 Days Discharge Instructions: As directed by physician Topical: Mupirocin Ointment 1 x Per Day/30 Days Discharge Instructions: Apply Mupirocin (Bactroban) as instructed Prim Dressing: Endoform 2x2 in 1 x Per Day/30 Days ary Discharge Instructions: Moisten with saline Secondary Dressing: Optifoam Non-Adhesive Dressing, 4x4 in 1 x Per Day/30 Days Discharge Instructions: foam donut Secondary Dressing: Woven Gauze Sponges 2x2 in 1 x Per Day/30 Days Discharge Instructions: Apply over primary dressing as directed. Secured With: Child psychotherapist, Sterile 2x75 (in/in) 1 x Per Day/30 Days Discharge Instructions: Secure with stretch gauze as directed. 1. Endoform under the total contact cast 2. Follow-up in 1 week [name], [name] ([id number]) [phone].pdf Page 7 of 9 Electronic Signature(s) Signed: 02/13/2023 3:08:51 PM By: Deon Pilling RN, BSN Signed:  02/17/2023 10:54:50 AM By: [name] DO Previous Signature: 02/02/2023 3:39:58 PM Version By: [name] DO Entered By: Deon Pilling on 02/09/2023 14:30:30 -------------------------------------------------------------------------------- HxROS Details Patient Name: Date of Service: [name], [name]. 02/02/2023 10:15 [name] Medical Record Number: [id number] Patient Account Number: [id number] Date of Birth/Sex: Treating RN: [birth year] (77 y.o. [name]) Primary Care Provider: [name], [name] Other Clinician: Referring Provider: Treating Provider/Extender: [name] Wann [name] Weeks in Treatment: 6 Information Obtained From Patient Chart Hematologic/Lymphatic Medical History: Positive for: Anemia Respiratory Medical History: Positive for: Sleep Apnea - uses CPAP Cardiovascular Medical History: Positive for:  Arrhythmia - Bill fibb; Coronary Artery Disease; Hypertension; Myocardial Infarction Past Medical History Notes: hyperlipidemia Gastrointestinal Medical History: Past Medical History Notes: fatty liver; GERD Endocrine Medical History: Positive for: Type II Diabetes Time with diabetes: 10 years Treated with: Insulin [name] sugar tested every day: Yes Tested : TID Genitourinary Medical History: Past Medical History Notes: radiation cystitis, CKD, [name] in urine Musculoskeletal Medical History: Positive for: Osteoarthritis Neurologic Medical History: Positive for: Neuropathy Oncologic Medical History: Positive for: Received Chemotherapy - 2022- Pancreatic Ca with chemo; Received Radiation - 10-12 years ago for prostate Ca Past Medical History Notes: hx prostate ca Psychiatric Medical HistoryCECILE, [name] ([id number]) [id number] Page 8 of 9 Negative for: Confinement Anxiety Past Medical History Notes: PTSD Immunizations Pneumococcal Vaccine: Received Pneumococcal Vaccination: Yes Received Pneumococcal Vaccination On or After 60th Birthday: Yes Implantable Devices None Hospitalization / Surgery History Type of Hospitalization/Surgery EUS Biopsy CA bypass graft cholecystectomy tonsillectomy appendectomy triple bypass Family and Social History Unknown History: Yes; Former smoker - quit in 1976; Marital Status - Married; Alcohol Use: Never; Drug Use: No History; Caffeine Use: Rarely; Financial Concerns: No; Food, Clothing or Shelter Needs: No; Support System Lacking: No; Transportation Concerns: No Electronic Signature(s) Signed: 02/02/2023 3:39:58 PM By: [name] DO Entered By: [name] on 02/02/2023 11:00:27 -------------------------------------------------------------------------------- Total Contact Cast Details Patient Name: Date of Service: Bill Mater [name]. 02/02/2023 10:15 [name] Medical Record Number:  [id number] Patient Account Number: [id number] Date of Birth/Sex: Treating RN: 01/15/1946 (77 y.o. Mare Ferrari Primary Care Provider: [name], [name] Other Clinician: Referring Provider: Treating Provider/Extender: [name] [name] Weeks in Treatment: 6 Bill Contact Cast Applied for Wound Assessment: otal Wound #1 Right Metatarsal head fifth Performed By: Physician [name], DO Post Procedure Diagnosis Same as Pre-procedure Electronic Signature(s) Signed: 02/02/2023 3:39:58 PM By: [name] DO Signed: 02/02/2023 4:17:27 PM By: Sharyn Creamer RN, BSN Entered By: Sharyn Creamer on 02/02/2023 11:07:01 -------------------------------------------------------------------------------- [name] Details Patient Name: Date of Service: [name], [name]. 02/02/2023 Medical Record Number: [id number] Patient Account Number: [id number] Date of Birth/Sex: Treating RN: September 19, 1946 (77 y.o. 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 foot with fat layer exposed Facility Procedures : CPT4 Code: [id number] Description: [phone] - APPLY TOTAL CONTACT LEG CAST ICD-10 Diagnosis Description E11.621 Type 2 diabetes mellitus with foot ulcer L97.512 Non-pressure chronic ulcer of other part of right foot with fat layer exposed Modifier: Quantity: 1 Physician Procedures : CPT4 Code Description Modifier [id number] - WC  PHYS LEVEL 3 - EST PT ICD-10 Diagnosis  Description E11.621 Type 2 diabetes mellitus with foot ulcer L97.512 Non-pressure chronic ulcer of other part of right foot with fat layer exposed E11.21 Type 2  diabetes mellitus with diabetic nephropathy Quantity: 1 : [id number] - WC PHYS APPLY TOTAL CONTACT CAST ICD-10 Diagnosis Description E11.621 Type 2 diabetes mellitus with foot ulcer L97.512 Non-pressure chronic ulcer of other part of right foot with fat layer exposed Quantity: 1 Electronic Signature(s) Signed: 02/02/2023 3:39:58 PM By: [name] DO Signed: 02/02/2023 4:17:27 PM By: Sharyn Creamer RN, BSN Entered By: Sharyn Creamer on 02/02/2023 11:10:33

## 2023-02-18 NOTE — Progress Notes (Signed)
[name], [name] ([id number]) [id number] Page 1 of 2 Visit Report for 02/17/2023 Problem List Details Patient Name: Date of Service: [name]. 02/17/2023 8:00 A M Medical Record Number: [id number] Patient Account Number: [id number] Date of Birth/Sex: Treating RN: 1946-01-05 (77 y.o. 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 02/17/2023 Medical Record Number: [id number] Patient Account Number: [id number] Date of Birth/Sex: Treating RN: 07/26/1946 (77 y.o. 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 Facility Procedures : CPT4 Code: [id number] Description: G0277-(Facility Use Only) HBOT full body chamber, 23mn , ICD-10 Diagnosis Description 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

## 2023-02-18 NOTE — Progress Notes (Addendum)
HYRUM, CANTER ([id number].pdf Page 1 of 2 Visit Report for 02/17/2023 HBO Details Patient Name: Date of Service: [name] 02/17/2023 8:00 Bill M Medical Record Number: [id number] Patient Account Number: [id number] Date of Birth/Sex: Treating RN: 1946/09/12 (77 y.o. [name] Primary Care Coulton Schlink: [name] Other Clinician: Valeria Batman Referring Ardenia Stiner: Treating Tonyetta Berko/Extender: [name], Jessica [name] Weeks in Treatment: 8 HBO Treatment Course Details Treatment Course Number: 1 Ordering Dreanna Kyllo: [name] T Treatments Ordered: otal 40 HBO Treatment Start Date: 01/05/2023 HBO Indication: Late Effect of Radiation HBO Treatment Details Treatment Number: 28 Patient Type: Outpatient Chamber Type: Monoplace Chamber Serial #: [id number] Treatment Protocol: 2.5 ATA with 90 minutes oxygen, with two 5 minute air breaks Treatment Details Compression Rate Down: 2.0 psi / minute De-Compression Rate Up: 2.0 psi / minute Bill breaks and breathing ir Compress Tx Pressure periods Decompress Decompress Begins Reached (leave unused spaces Begins Ends blank) Chamber Pressure (ATA 1 2.5 2.5 2.5 2.5 2.5 - - 2.5 1 ) Clock Time (24 hr) 08:03 08:17 08:48 08:53 08:23 08:28 - - 09:58 10:06 Treatment Length: 123 (minutes) Treatment Segments: 4 Vital Signs Capillary [name] Reference Range: 80 - 120 mg / dl HBO Diabetic [name] Intervention Range: <131 mg/dl or >249 mg/dl Time Vitals [name] Respiratory Capillary [name] Pulse Action Type: Pulse: Temperature: Taken: Pressure: Rate: Glucose (mg/dl): Meter #: Oximetry (%) Taken: Pre 07:58 147/64 85 16 97.3 166 Post 10:14 147/77 75 18 97.1 237 Treatment Response Treatment Toleration: Well Treatment Completion Status: Treatment Completed without Adverse Event Physician HBO Attestation: I certify that I supervised this HBO treatment in accordance with  Medicare guidelines. Bill trained emergency response team is readily available per Yes hospital policies and procedures. Continue HBOT as ordered. Yes Electronic Signature(s) Signed: 02/17/2023 2:52:18 PM By: [name] DO Previous Signature: 02/17/2023 11:07:49 AM Version By: Valeria Batman EMT Previous Signature: 02/17/2023 12:10:40 PM Version By: [name] DO Entered By: [name] on 02/17/2023 14:49:36 -------------------------------------------------------------------------------- HBO Safety Checklist Details Patient Name: Date of Service: [name], [name] 02/17/2023 8:00 Bill M Medical Record Number: [id number] Patient Account Number: [id number] Date of Birth/Sex: Treating RN: 03-24-1946 (77 y.o. [name] Primary Care Sade Mehlhoff: [name] Other Clinician: Valeria Batman Referring Leler Brion: Treating Texie Tupou/Extender: [name], Jessica [name] Weeks in Treatment: [place] Items [name], [name] ([id number]) [id number] Page 2 of 2 Safety Checklist Consent Form Signed Patient voided / foley secured and emptied When did you last eato 0630 Last dose of injectable or oral agent Yesterday Ostomy pouch emptied and vented if applicable NA All implantable devices assessed, documented and approved NA Intravenous access site secured and place NA Valuables secured Linens and cotton and cotton/polyester blend (less than 51% polyester) Personal oil-based products / skin lotions / body lotions removed Wigs or hairpieces removed NA Smoking or tobacco materials removed Books / newspapers / magazines / loose paper removed Cologne, aftershave, perfume and deodorant removed Jewelry removed (may wrap wedding band) Make-up removed NA Hair care products removed Battery operated devices (external) removed Heating patches and chemical warmers removed Titanium eyewear removed NA Nail polish cured greater than 10  hours NA Casting material cured greater than 10 hours NA Hearing aids removed NA Loose dentures or partials removed NA Prosthetics have been removed NA Patient demonstrates correct use of air break device (if applicable) Patient concerns have been addressed Patient grounding bracelet on and cord attached to chamber Specifics for Inpatients (  complete in addition to above) Medication sheet sent with patient NA Intravenous medications needed or due during therapy sent with patient NA Drainage tubes (e.g. nasogastric tube or chest tube secured and vented) NA Endotracheal or Tracheotomy tube secured NA Cuff deflated of air and inflated with saline NA Airway suctioned NA Notes The safety checklist was done before the treatment was started. Electronic Signature(s) Signed: 02/17/2023 11:06:06 AM By: Valeria Batman EMT Entered By: Valeria Batman on 02/17/2023 11:06:05

## 2023-02-18 NOTE — Progress Notes (Signed)
LADARREN, ESTIME ([id number] Page 1 of 2 Visit Report for 02/17/2023 Arrival Information Details Patient Name: Date of Service: [name] 02/17/2023 8:00 A M Medical Record Number: [id number] Patient Account Number: [id number] Date of Birth/Sex: Treating RN: 1946-02-16 (77 y.o. [name] Primary Care Aeron Lheureux: [name], [name] Other Clinician: [name] Referring John Williamsen: Treating Marlina Cataldi/Extender: [name], [name], [name] in Treatment: 8 Visit Information History Since Last Visit All ordered tests and consults were completed: Yes Patient Arrived: [name] Added or deleted any medications: No Arrival Time: 07:20 Any new allergies or adverse reactions: No Accompanied By: self Had a fall or experienced change in No Transfer Assistance: None activities of daily living that may affect Patient Identification Verified: Yes risk of falls: Secondary Verification Process Completed: Yes Signs or symptoms of abuse/neglect since last visito No Patient Requires Transmission-Based No Hospitalized since last visit: No Precautions: Implantable device outside of the clinic excluding No Patient Has Alerts: Yes cellular tissue based products placed in the center Patient Alerts: Patient on Blood Thinner since last visit: LTBI: 0.88 09/25/2021 Pain Present Now: No VVS Electronic Signature(s) Signed: 02/17/2023 10:32:56 AM By: [name] , , Entered By: [name] on 02/17/2023 10:32:56 -------------------------------------------------------------------------------- Encounter Discharge Information Details Patient Name: Date of Service: [name], [name] 02/17/2023 8:00 A M Medical Record Number: [id number] Patient Account Number: [id number] Date of Birth/Sex: Treating RN: 1946-06-07 (77 y.o. [name] Primary Care Deanza Upperman: [name], [name] Other Clinician: Valeria Batman Referring  Dorothye Berni: Treating Keltin Baird/Extender: [name], [name], [name] in Treatment: 8 Encounter Discharge Information Items Discharge Condition: Stable Ambulatory Status: Walker Discharge Destination: Home Transportation: Private Auto Accompanied By: None Schedule Follow-up Appointment: Yes Clinical Summary of Care: Electronic Signature(s) Signed: 02/17/2023 11:08:58 AM By: Valeria Batman EMT Entered By: Valeria Batman on 02/17/2023 11:08:58 -------------------------------------------------------------------------------- Vitals Details Patient Name: Date of Service: [place], [name] 02/17/2023 8:00 A M Medical Record Number: [id number] Patient Account Number: [id number] Date of Birth/Sex: Treating RN: March 08, 1946 (77 y.o. [name] Primary Care Mendi Constable: [name], [name] Other Clinician: Valeria Batman Referring Heide Brossart: Treating Sho Salguero/Extender: [name], [name], [name] in Treatment: 8 Vital Signs Time Taken: 07:58 Temperature (F): 97.3 Rhodes, Sebastian ([id number]) [phone].pdf Page 2 of 2 Height (in): 71 Pulse (bpm): 85 Weight (lbs): 252 Respiratory Rate (breaths/min): 16 Body Mass Index (BMI): 35.1 Blood Pressure (mmHg): 147/64 Capillary Blood Glucose (mg/dl): 166 Reference Range: 80 - 120 mg / dl Electronic Signature(s) Signed: 02/17/2023 11:04:20 AM By: Valeria Batman EMT Entered By: Valeria Batman on 02/17/2023 11:04:20

## 2023-02-19 ENCOUNTER — Encounter (HOSPITAL_BASED_OUTPATIENT_CLINIC_OR_DEPARTMENT_OTHER): Payer: Medicare Other | Admitting: Internal Medicine

## 2023-02-19 DIAGNOSIS — N189 Chronic kidney disease, unspecified: Secondary | ICD-10-CM | POA: Diagnosis not present

## 2023-02-19 DIAGNOSIS — E1121 Type 2 diabetes mellitus with diabetic nephropathy: Secondary | ICD-10-CM | POA: Diagnosis not present

## 2023-02-19 DIAGNOSIS — N3041 Irradiation cystitis with hematuria: Secondary | ICD-10-CM

## 2023-02-19 DIAGNOSIS — L97512 Non-pressure chronic ulcer of other part of right foot with fat layer exposed: Secondary | ICD-10-CM | POA: Diagnosis not present

## 2023-02-19 DIAGNOSIS — C61 Malignant neoplasm of prostate: Secondary | ICD-10-CM

## 2023-02-19 DIAGNOSIS — E1122 Type 2 diabetes mellitus with diabetic chronic kidney disease: Secondary | ICD-10-CM | POA: Diagnosis not present

## 2023-02-19 DIAGNOSIS — I129 Hypertensive chronic kidney disease with stage 1 through stage 4 chronic kidney disease, or unspecified chronic kidney disease: Secondary | ICD-10-CM | POA: Diagnosis not present

## 2023-02-19 DIAGNOSIS — E11621 Type 2 diabetes mellitus with foot ulcer: Secondary | ICD-10-CM

## 2023-02-19 LAB — GLUCOSE, CAPILLARY
Glucose-Capillary: 221 mg/dL — ABNORMAL HIGH (ref 70–99)
Glucose-Capillary: 220 mg/dL — ABNORMAL HIGH (ref 70–99)

## 2023-02-19 NOTE — Progress Notes (Addendum)
[name], CORNETT ([id number] Page 1 of 2 Visit Report for 02/18/2023 HBO Details Patient Name: Date of Service: [name] 02/18/2023 8:00 A M Medical Record Number: [id number] Patient Account Number: [id number] Date of Birth/Sex: Treating RN: Nov 26, 1946 (77 y.o. [name] Primary Care Yael Coppess: [name], [name] Other Clinician: [name] Referring Brilyn Tuller: Treating Kylah Maresh/Extender: Robson, Michael [name], [name] in Treatment: 8 HBO Treatment Course Details Treatment Course Number: 1 Ordering Jonothan Heberle: [name] T Treatments Ordered: otal 40 HBO Treatment Start Date: 01/05/2023 HBO Indication: Late Effect of Radiation HBO Treatment Details Treatment Number: 29 Patient Type: Outpatient Chamber Type: Monoplace Chamber Serial #: [id number] Treatment Protocol: 2.5 ATA with 90 minutes oxygen, with two 5 minute air breaks Treatment Details Compression Rate Down: 1.5 psi / minute De-Compression Rate Up: 2.0 psi / minute A breaks and breathing ir Compress Tx Pressure periods Decompress Decompress Begins Reached (leave unused spaces Begins Ends blank) Chamber Pressure (ATA 1 2.5 2.5 2.5 2.5 2.5 - - 2.5 1 ) Clock Time (24 hr) 07:51 08:03 08:33 08:38 09:08 09:13 - - 09:43 09:54 Treatment Length: 123 (minutes) Treatment Segments: 4 Vital Signs Capillary Blood Glucose Reference Range: 80 - 120 mg / dl HBO Diabetic Blood Glucose Intervention Range: <131 mg/dl or >249 mg/dl Type: Time Vitals Blood Respiratory Capillary Blood Glucose Pulse Action Pulse: Temperature: Taken: Pressure: Rate: Glucose (mg/dl): Meter #: Oximetry (%) Taken: Pre 07:47 154/69 86 18 99 205 1 none per protocol Post 09:59 151/83 79 18 98.4 246 1 none per protocol Treatment Response Treatment Toleration: Well Treatment Completion Status: Treatment Completed without Adverse Event Treatment Notes Patient arrived, vital signs were normal.  Patient was safely placed in the chamber after performing safety check. Compression of the chamber proceeded at 1 psi/min until verifying normal ear equalization at which time rate set was increased to 2 psi/min. Patient tolerated treatment and subsequent decompression of the chamber at 2 psi/min. Mr. Shanafelt denied any issues with ear equalization and/or pain related to barotrauma. Patient was stable upon discharge. Electronic Signature(s) Signed: 02/18/2023 4:27:45 PM By: [name] , , Signed: 02/19/2023 7:44:12 AM By: Linton Ham MD Entered By: [name] on 02/18/2023 16:27:45 -------------------------------------------------------------------------------- HBO Safety Checklist Details Patient Name: Date of Service: [name], [name] 02/18/2023 8:00 A M Medical Record Number: [id number] Patient Account Number: [id number] Date of Birth/Sex: Treating RN: 1945-12-28 (77 y.o. [name] Primary Care Vrinda Heckstall: [name], [name] Other Clinician: [name] Referring Prabhleen Montemayor: Treating Sarthak Rubenstein/Extender: Robson, Michael [name], [name] in Treatment: 8 HBO Safety Checklist Items Safety Checklist Consent Form Signed OSHANE, BORUNDA ([id number]) [id number] Page 2 of 2 Patient voided / foley secured and emptied When did you last eato 0630 Last dose of injectable or oral agent 0640 Ostomy pouch emptied and vented if applicable NA All implantable devices assessed, documented and approved NA Intravenous access site secured and place NA Valuables secured Linens and cotton and cotton/polyester blend (less than 51% polyester) Personal oil-based products / skin lotions / body lotions removed NA Wigs or hairpieces removed NA Smoking or tobacco materials removed Books / newspapers / magazines / loose paper removed Cologne, aftershave, perfume and deodorant removed Jewelry removed (may wrap wedding band) Make-up  removed Hair care products removed Battery operated devices (external) removed Heating patches and chemical warmers removed Titanium eyewear removed Nail polish cured greater than 10 hours NA Casting material cured greater than 10 hours NA Hearing aids removed NA Loose dentures  or partials removed NA Prosthetics have been removed NA Patient demonstrates correct use of air break device (if applicable) Patient concerns have been addressed Patient grounding bracelet on and cord attached to chamber Specifics for Inpatients (complete in addition to above) Medication sheet sent with patient NA Intravenous medications needed or due during therapy sent with patient NA Drainage tubes (e.g. nasogastric tube or chest tube secured and vented) NA Endotracheal or Tracheotomy tube secured NA Cuff deflated of air and inflated with saline NA Airway suctioned NA Notes Paper version used prior to treatment start. Electronic Signature(s) Signed: 02/18/2023 1:56:46 PM By: [name] , , Previous Signature: 02/18/2023 10:22:36 AM Version By: [name] , , Entered By: [name] on 02/18/2023 13:56:46

## 2023-02-19 NOTE — Progress Notes (Signed)
DEVLEN, STURKIE ([id number]) [id number] Page 1 of 8 Visit Report for 02/16/2023 Arrival Information Details Patient Name: Date of Service: [name] 02/16/2023 1:15 PM Medical Record Number: [id number] Patient Account Number: [id number] Date of Birth/Sex: Treating RN: 12-Apr-1946 (77 y.o. M) Primary Care Madie Cahn: [name] Other Clinician: Referring Analiz Tvedt: Treating Niketa Turner/Extender: [name], Jessica [name] Weeks in Treatment: 8 Visit Information History Since Last Visit Added or deleted any medications: No Patient Arrived: [name] Any new allergies or adverse reactions: No Arrival Time: 13:14 Had Bill fall or experienced change in No Accompanied By: self activities of daily living that may affect Transfer Assistance: None risk of falls: Patient Identification Verified: Yes Signs or symptoms of abuse/neglect since last No Secondary Verification Process Completed: Yes visito Patient Requires Transmission-Based No Hospitalized since last visit: No Precautions: Implantable device outside of the clinic No Patient Has Alerts: Yes excluding Patient Alerts: Patient on [name] cellular tissue based products placed in the LTBI: 0.88 09/25/2021 center VVS since last visit: Has Dressing in Place as Prescribed: Yes Has Footwear/Offloading in Place as Yes Prescribed: Right: Removable Cast Walker/Walking Boot Pain Present Now: No Electronic Signature(s) Signed: 02/17/2023 3:59:08 PM By: Erenest Blank Entered By: Erenest Blank on 02/16/2023 13:15:06 -------------------------------------------------------------------------------- Clinic Level of Care Assessment Details Patient Name: Date of Service: [name] 02/16/2023 1:15 PM Medical Record Number: [id number] Patient Account Number: [id number] Date of Birth/Sex: Treating RN: 1946-05-23 (77 y.o. [name] Primary Care [name]: [name] Other  Clinician: Referring Cambri Plourde: Treating Nioma Mccubbins/Extender: [name], Jessica [name] Weeks in Treatment: 8 Clinic Level of Care Assessment Items TOOL 4 Quantity Score X- 1 0 Use when only an EandM is performed on FOLLOW-UP visit ASSESSMENTS - Nursing Assessment / Reassessment X- 1 10 Reassessment of Co-morbidities (includes updates in patient status) X- 1 5 Reassessment of Adherence to Treatment Plan ASSESSMENTS - Wound and Skin Bill ssessment / Reassessment X - Simple Wound Assessment / Reassessment - one wound 1 5 '[]'$  - 0 Complex Wound Assessment / Reassessment - multiple wounds X- 1 10 Dermatologic / Skin Assessment (not related to wound area) ASSESSMENTS - Focused Assessment X- 1 5 Circumferential Edema Measurements - multi extremities '[]'$  - 0 Nutritional Assessment / Counseling / Intervention '[]'$  - 0 Lower Extremity Assessment (monofilament, tuning fork, pulses) '[]'$  - 0 Peripheral Arterial Disease Assessment (using hand held doppler) ASSESSMENTS - Ostomy and/or Continence Assessment and Care Coolidge, [place] ([id number] Page 2 of 8 '[]'$  - 0 Incontinence Assessment and Management '[]'$  - 0 Ostomy Care Assessment and Management (repouching, etc.) PROCESS - Coordination of Care X - Simple Patient / Family Education for ongoing care 1 15 '[]'$  - 0 Complex (extensive) Patient / Family Education for ongoing care X- 1 10 Staff obtains Programmer, systems, Records, T Results / Process Orders est '[]'$  - 0 Staff telephones HHA, Nursing Homes / Clarify orders / etc '[]'$  - 0 Routine Transfer to another Facility (non-emergent condition) '[]'$  - 0 Routine Hospital Admission (non-emergent condition) '[]'$  - 0 New Admissions / Biomedical engineer / Ordering NPWT Apligraf, etc. , '[]'$  - 0 Emergency Hospital Admission (emergent condition) X- 1 10 Simple Discharge Coordination '[]'$  - 0 Complex (extensive) Discharge Coordination PROCESS - Special Needs '[]'$  -  0 Pediatric / Minor Patient Management '[]'$  - 0 Isolation Patient Management '[]'$  - 0 Hearing / Language / Visual special needs '[]'$  - 0 Assessment of Community assistance (transportation, D/C planning, etc.) '[]'$  - 0 Additional assistance / Altered  mentation '[]'$  - 0 Support Surface(s) Assessment (bed, cushion, seat, etc.) INTERVENTIONS - Wound Cleansing / Measurement X - Simple Wound Cleansing - one wound 1 5 '[]'$  - 0 Complex Wound Cleansing - multiple wounds X- 1 5 Wound Imaging (photographs - any number of wounds) '[]'$  - 0 Wound Tracing (instead of photographs) X- 1 5 Simple Wound Measurement - one wound '[]'$  - 0 Complex Wound Measurement - multiple wounds INTERVENTIONS - Wound Dressings '[]'$  - 0 Small Wound Dressing one or multiple wounds '[]'$  - 0 Medium Wound Dressing one or multiple wounds '[]'$  - 0 Large Wound Dressing one or multiple wounds '[]'$  - 0 Application of Medications - topical '[]'$  - 0 Application of Medications - injection INTERVENTIONS - Miscellaneous '[]'$  - 0 External ear exam '[]'$  - 0 Specimen Collection (cultures, biopsies, [name], body fluids, etc.) '[]'$  - 0 Specimen(s) / Culture(s) sent or taken to Lab for analysis '[]'$  - 0 Patient Transfer (multiple staff / Civil Service fast streamer / Similar devices) '[]'$  - 0 Simple Staple / Suture removal (25 or less) '[]'$  - 0 Complex Staple / Suture removal (26 or more) '[]'$  - 0 Hypo / Hyperglycemic Management (close monitor of [name]) '[]'$  - 0 Ankle / Brachial Index (ABI) - do not check if billed separately X- 1 5 Vital Signs Has the patient been seen at the hospital within the last three years: Yes Total Score: 90 Level Of Care: New/Established - Level 3 Moritz, Russell [name] ([id number] Page 3 of 8 Electronic Signature(s) Signed: 02/16/2023 5:32:25 PM By: Deon Pilling RN, BSN Entered By: Deon Pilling on 02/16/2023  13:50:07 -------------------------------------------------------------------------------- Encounter Discharge Information Details Patient Name: Date of Service: Bill [facility] Durward Parcel [name]. 02/16/2023 1:15 PM Medical Record Number: [id number] Patient Account Number: [id number] Date of Birth/Sex: Treating RN: Dec 23, 1945 (77 y.o. [name] Primary Care Khushi Zupko: [name] Other Clinician: Referring Mineola Duan: Treating Jailey Booton/Extender: [name], Jessica [name] Weeks in Treatment: 8 Encounter Discharge Information Items Discharge Condition: Stable Ambulatory Status: Walker Discharge Destination: Home Transportation: Private Auto Accompanied By: self Schedule Follow-up Appointment: Yes Clinical Summary of Care: Notes pegassist surgical shoe to right foot. Electronic Signature(s) Signed: 02/16/2023 5:32:25 PM By: Deon Pilling RN, BSN Entered By: Deon Pilling on 02/16/2023 13:51:36 -------------------------------------------------------------------------------- Lower Extremity Assessment Details Patient Name: Date of Service: [facility] 02/16/2023 1:15 PM Medical Record Number: [id number] Patient Account Number: [id number] Date of Birth/Sex: Treating RN: 04-03-1946 (77 y.o. M) Primary Care Stellan Vick: [name] Other Clinician: Referring Wray Goehring: Treating Makila Colombe/Extender: [name], Jessica [name] Weeks in Treatment: 8 Edema Assessment Assessed: [Left: No] [Right: No] Edema: [Left: N] [Right: o] Calf Left: Right: Point of Measurement: 38 cm From Medial Instep 39.1 cm Ankle Left: Right: Point of Measurement: 13 cm From Medial Instep 24 cm Electronic Signature(s) Signed: 02/17/2023 3:59:08 PM By: Erenest Blank Entered By: Erenest Blank on 02/16/2023 13:28:05 -------------------------------------------------------------------------------- Multi Wound Chart Details Patient Name: Date of Service: [name], [name] 02/16/2023 1:15 PM Medical  Record Number: [id number] Patient Account Number: [id number] Date of Birth/Sex: Treating RN: Mar 24, 1946 (77 y.o. 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 Primary Etiology: Extremity Anemia, Sleep Apnea, Arrhythmia, [name] [name] Comorbid History: Coronary Artery Disease, Hypertension, Myocardial Infarction, Type II Diabetes, Osteoarthritis, Neuropathy, Received Chemotherapy, Received Radiation 01/22/2022 [name] [name] Date Acquired: 3 [name] [name] Weeks of Treatment: Healed - Epithelialized [name] [name] Wound Status: No [name] [name] Wound Recurrence: 0x0x0 [name] [name] Measurements [name] x W x D (cm) 0 [name] [name] Bill (cm) : rea 0 [name] [name] Volume (cm) : 100.00% [name] [name] % Reduction in Bill rea: 100.00% [name] [name] % Reduction in Volume: Grade 1 [name] [name] Classification: Medium [name] [name] Exudate Bill mount: Serosanguineous [name] [name] Exudate Type: red, brown [name] [name] Exudate Color: Distinct, outline attached [name] [name] Wound Margin: Large (67-100%) [name] [name] Granulation Bill mount: None Present (0%) [name] [name] Necrotic Bill mount: Fat Layer (Subcutaneous Tissue): Yes [name] [name] Exposed Structures: Fascia: No Tendon: No Muscle: No Joint: No Bone: No Large (67-100%) [name] [name] Epithelialization: Callus: Yes [name] [name] Periwound Skin Texture: Excoriation: No Induration: No Crepitus: No Rash: No Scarring: No Dry/Scaly: Yes [name] [name] Periwound Skin Moisture: Maceration:  No Atrophie Blanche: No [name] [name] Periwound Skin Color: Cyanosis: No Ecchymosis: No Erythema: No Hemosiderin Staining: No Mottled: No Pallor: No Rubor: No Treatment Notes Electronic Signature(s) Signed: 02/16/2023 3:19:32 PM By: [name] DO Entered By: [name] on 02/16/2023 14:09:48 [name], [name] ([id number] Page 5 of 8 -------------------------------------------------------------------------------- Multi-Disciplinary Care Plan Details Patient Name: Date of Service: [name] 02/16/2023 1:15 PM Medical Record Number: [id number] Patient Account Number: [id number] Date of Birth/Sex: Treating RN: Apr 18, 1946 (77 y.o. [name], [name] Primary Care Taurus Alamo: [name] Other Clinician: Referring Nalini Alcaraz: Treating Brok Stocking/Extender: [name], Jessica [name] Weeks in Treatment: 8 Active Inactive HBO Nursing Diagnoses: Anxiety related to feelings of confinement associated with the hyperbaric oxygen chamber Potential for barotraumas to ears, sinuses, teeth, and lungs or cerebral gas embolism related to changes in atmospheric pressure inside hyperbaric oxygen chamber Goals: Patient will tolerate the hyperbaric oxygen therapy treatment Date Initiated: 12/22/2022 Target Resolution Date: 04/03/2023 Goal Status: Active Patient/caregiver will verbalize understanding of HBO goals, rationale, procedures and potential hazards Date Initiated: 12/22/2022 Date Inactivated: 02/16/2023 Target Resolution Date: 02/16/2023 Goal Status: Met Interventions: Assess and provide for patients comfort related to the hyperbaric environment and equalization of middle ear Assess patient for any history of confinement anxiety Notes: Wound/Skin Impairment Nursing Diagnoses: Knowledge deficit related to ulceration/compromised skin integrity Goals: Patient/caregiver will verbalize understanding of skin care regimen Date Initiated:  01/22/2023 Target Resolution Date: 02/20/2023 Goal Status: Active Interventions: Assess patient/caregiver ability to perform ulcer/skin care regimen upon admission and as needed Assess ulceration(s) every visit Provide education on ulcer and skin care Treatment Activities: Skin care regimen initiated : 01/22/2023 Topical wound management initiated : 01/22/2023 Notes: Electronic Signature(s) Signed: 02/16/2023 5:32:25 PM By: Deon Pilling RN, BSN Entered By: Deon Pilling on 02/16/2023 13:16:21 -------------------------------------------------------------------------------- Pain Assessment Details Patient Name: Date of Service: [name], [name] 02/16/2023 1:15 PM Medical Record Number: [id number] Patient Account Number: [id number] Date of Birth/Sex: Treating RN: Nov 27, 1946 (77 y.o. M) Primary Care Hoyle Barkdull: [name] Other Clinician: Referring Eber Ferrufino: Treating Hannan Hutmacher/Extender: [name], Jessica [name] Weeks in Treatment: 8 Active Problems Location of Pain Severity and Description of Pain Patient Has Paino No [name], [name] ([id number]) [id number] Page 6 of 8 Patient Has Paino No Site Locations Pain Management and Medication Current Pain Management: Electronic Signature(s) Signed: 02/17/2023 3:59:08 PM By: Erenest Blank Entered By: Erenest Blank on 02/16/2023 13:27:49 --------------------------------------------------------------------------------  Patient/Caregiver Education Details Patient Name: Date of Service: [place] 3/4/2024andnbsp1:15 PM Medical Record Number: [id number] Patient Account Number: [id number] Date of Birth/Gender: Treating RN: 1946-02-02 (77 y.o. [name] Primary Care Physician: [name] Other Clinician: Referring Physician: Treating Physician/Extender: [name], Jessica [name] Weeks in Treatment: 8 Education Assessment Education Provided To: Patient Education Topics  Provided Hyperbaric Oxygenation: Handouts: Hyperbaric Oxygen Methods: Explain/Verbal Responses: Reinforcements needed Electronic Signature(s) Signed: 02/16/2023 5:32:25 PM By: Deon Pilling RN, BSN Entered By: Deon Pilling on 02/16/2023 13:16:49 -------------------------------------------------------------------------------- Wound Assessment Details Patient Name: Date of Service: [name], [name] 02/16/2023 1:15 PM Medical Record Number: [id number] Patient Account Number: [id number] Date of Birth/Sex: Treating RN: [birth year] (77 y.o. [name] Primary Care Treysen Sudbeck: [name] Other Clinician: Referring Lathaniel Legate: Treating Venissa Nappi/Extender: [name], Jessica [name] Weeks in Treatment: 8 Wound Status Wound Number: 1 Primary Diabetic Wound/Ulcer of the Lower Extremity Etiology: Wound Location: Right Metatarsal head fifth Wound Healed - Epithelialized Wounding Event: Gradually Appeared StatusJISAIAH, [name] ([id number]) [id number] Page 7 of 8 Status: Date Acquired: 01/22/2022 Comorbid Anemia, Sleep Apnea, Arrhythmia, Coronary Artery Disease, Weeks Of Treatment: 3 History: Hypertension, Myocardial Infarction, Type II Diabetes, Clustered Wound: No Osteoarthritis, Neuropathy, Received Chemotherapy, Received Radiation Photos Wound Measurements Length: (cm) Width: (cm) Depth: (cm) Area: (cm) Volume: (cm) 0 % Reduction in Area: 100% 0 % Reduction in Volume: 100% 0 Epithelialization: Large (67-100%) 0 Tunneling: No 0 Undermining: No Wound Description Classification: Grade 1 Wound Margin: Distinct, outline attached Exudate Amount: Medium Exudate Type: Serosanguineous Exudate Color: red, brown Foul Odor After Cleansing: No Slough/Fibrino No Wound Bed Granulation Amount: Large (67-100%) Exposed Structure Necrotic Amount: None Present (0%) Fascia Exposed: No Fat Layer (Subcutaneous Tissue) Exposed: Yes Tendon Exposed:  No Muscle Exposed: No Joint Exposed: No Bone Exposed: No Periwound Skin Texture Texture Color No Abnormalities Noted: No No Abnormalities Noted: No Callus: Yes Atrophie Blanche: No Crepitus: No Cyanosis: No Excoriation: No Ecchymosis: No Induration: No Erythema: No Rash: No Hemosiderin Staining: No Scarring: No Mottled: No Pallor: No Moisture Rubor: No No Abnormalities Noted: No Dry / Scaly: Yes Maceration: No Electronic Signature(s) Signed: 02/16/2023 5:32:25 PM By: Deon Pilling RN, BSN Entered By: Deon Pilling on 02/16/2023 13:48:40 -------------------------------------------------------------------------------- Vitals Details Patient Name: Date of Service: [name], [name] 02/16/2023 1:15 PM Medical Record Number: [id number] Patient Account Number: [id number] Date of Birth/Sex: Treating RN: Feb 11, 1946 (77 y.o. M) Primary Care Mizraim Harmening: [name] Other Clinician: Referring Seydou Hearns: Treating Brenson Hartman/Extender: [name], Jessica [name] Weeks in Treatment: [place] Signs [name], [name] ([id number]) [id number] Page 8 of 8 Time Taken: 13:15 Temperature (F): 98.1 Height (in): 71 Pulse (bpm): 64 Weight (lbs): 252 Respiratory Rate (breaths/min): 18 Body Mass Index (BMI): 35.1 [name]: 106/68 Capillary [name]: 97 Reference Range: 80 - 120 mg / dl Electronic Signature(s) Signed: 02/17/2023 3:59:08 PM By: Erenest Blank Entered By: Erenest Blank on 02/16/2023 13:15:40

## 2023-02-20 ENCOUNTER — Encounter (HOSPITAL_BASED_OUTPATIENT_CLINIC_OR_DEPARTMENT_OTHER): Payer: Medicare Other | Admitting: Internal Medicine

## 2023-02-20 DIAGNOSIS — N3041 Irradiation cystitis with hematuria: Secondary | ICD-10-CM

## 2023-02-20 DIAGNOSIS — I129 Hypertensive chronic kidney disease with stage 1 through stage 4 chronic kidney disease, or unspecified chronic kidney disease: Secondary | ICD-10-CM | POA: Diagnosis not present

## 2023-02-20 DIAGNOSIS — L97512 Non-pressure chronic ulcer of other part of right foot with fat layer exposed: Secondary | ICD-10-CM | POA: Diagnosis not present

## 2023-02-20 DIAGNOSIS — E11621 Type 2 diabetes mellitus with foot ulcer: Secondary | ICD-10-CM

## 2023-02-20 DIAGNOSIS — E1121 Type 2 diabetes mellitus with diabetic nephropathy: Secondary | ICD-10-CM | POA: Diagnosis not present

## 2023-02-20 DIAGNOSIS — E1122 Type 2 diabetes mellitus with diabetic chronic kidney disease: Secondary | ICD-10-CM | POA: Diagnosis not present

## 2023-02-20 DIAGNOSIS — N189 Chronic kidney disease, unspecified: Secondary | ICD-10-CM | POA: Diagnosis not present

## 2023-02-20 DIAGNOSIS — C61 Malignant neoplasm of prostate: Secondary | ICD-10-CM

## 2023-02-20 LAB — GLUCOSE, CAPILLARY
Glucose-Capillary: 194 mg/dL — ABNORMAL HIGH (ref 70–99)
Glucose-Capillary: 254 mg/dL — ABNORMAL HIGH (ref 70–99)

## 2023-02-20 NOTE — Progress Notes (Signed)
LINELL, RUDDOCK ([id number] Page 1 of 2 Visit Report for 02/18/2023 Arrival Information Details Patient Name: Date of Service: [name] 02/18/2023 8:00 A M Medical Record Number: [id number] Patient Account Number: [id number] Date of Birth/Sex: Treating RN: 1946/09/14 (77 y.o. [name] Primary Care Robbin Escher: [name], [name] Other Clinician: [name] Referring Lawson Mahone: Treating Fowler Antos/Extender: Robson, Michael [name], [name] in Treatment: 8 Visit Information History Since Last Visit All ordered tests and consults were completed: Yes Patient Arrived: [name] Added or deleted any medications: No Arrival Time: 07:30 Any new allergies or adverse reactions: No Accompanied By: self Had a fall or experienced change in No Transfer Assistance: None activities of daily living that may affect Patient Identification Verified: Yes risk of falls: Secondary Verification Process Completed: Yes Signs or symptoms of abuse/neglect since last visito No Patient Requires Transmission-Based Precautions: No Hospitalized since last visit: No Patient Has Alerts: Yes Implantable device outside of the clinic excluding No Patient Alerts: Patient on Blood Thinner cellular tissue based products placed in the center since last visit: Pain Present Now: No Electronic Signature(s) Signed: 02/18/2023 2:00:39 PM By: [name] , , Previous Signature: 02/18/2023 1:56:29 PM Version By: [name] , , Previous Signature: 02/18/2023 10:20:38 AM Version By: [name] , , Entered By: [name] on 02/18/2023 14:00:39 -------------------------------------------------------------------------------- Encounter Discharge Information Details Patient Name: Date of Service: [facility], [name] 02/18/2023 8:00 A M Medical Record Number: [id number] Patient Account Number: [id number] Date of  Birth/Sex: Treating RN: 1945-12-26 (77 y.o. [name] Primary Care Cervando Durnin: [name], [name] Other Clinician: [name] Referring Tarry Fountain: Treating Daci Stubbe/Extender: Robson, Michael [name], [name] in Treatment: 8 Encounter Discharge Information Items Discharge Condition: Stable Ambulatory Status: Walker Discharge Destination: Home Transportation: Private Auto Accompanied By: self Schedule Follow-up Appointment: No Clinical Summary of Care: Electronic Signature(s) Signed: 02/18/2023 2:00:10 PM By: [name] , , Previous Signature: 02/18/2023 10:25:49 AM Version By: [name] , , Entered By: [name] on 02/18/2023 14:00:10 -------------------------------------------------------------------------------- Vitals Details Patient Name: Date of Service: [name], [name] 02/18/2023 8:00 A M Medical Record Number: [id number] Patient Account Number: [id number] Date of Birth/Sex: Treating RN: 03/29/1946 (77 y.o. [name] Primary Care Yarel Kilcrease: [name], [name] Other Clinician: [name] Referring Laria Grimmett: Treating Terryn Redner/Extender: Robson, Michael [name], [name] in Treatment: [place] Signs IRAN, WALKENHORST ([id number] Page 2 of 2 Time Taken: 07:47 Temperature (F): 99 Height (in): 71 Pulse (bpm): 86 Weight (lbs): 252 Respiratory Rate (breaths/min): 18 Body Mass Index (BMI): 35.1 Blood Pressure (mmHg): 154/69 Capillary Blood Glucose (mg/dl): 205 Reference Range: 80 - 120 mg / dl Electronic Signature(s) Signed: 02/18/2023 1:56:36 PM By: [name] , , Previous Signature: 02/18/2023 10:21:09 AM Version By: [name] , , Entered By: [name] on 02/18/2023 13:56:36

## 2023-02-20 NOTE — Progress Notes (Signed)
MATHIEU, ZEHM ([id number].pdf Page 1 of 2 Visit Report for 02/19/2023 HBO Details Patient Name: Date of Service: [name]. 02/19/2023 8:00 Bill M Medical Record Number: [id number] Patient Account Number: [id number] Date of Birth/Sex: Treating RN: 09/06/1946 (77 y.o. [name] Primary Care Ramani Riva: [name], [name] Other Clinician: [name] Referring Sharnice Bosler: Treating Colena Ketterman/Extender: [name], [name], [name] in Treatment: 8 HBO Treatment Course Details Treatment Course Number: 1 Ordering Zara Wendt: [name] T Treatments Ordered: otal 40 HBO Treatment Start Date: 01/05/2023 HBO Indication: Late Effect of Radiation HBO Treatment Details Treatment Number: 30 Patient Type: Outpatient Chamber Type: Monoplace Chamber Serial #: [id number] Treatment Protocol: 2.5 ATA with 90 minutes oxygen, with two 5 minute air breaks Treatment Details Compression Rate Down: 2.0 psi / minute De-Compression Rate Up: Bill breaks and breathing ir Compress Tx Pressure periods Decompress Decompress Begins Reached (leave unused spaces Begins Ends blank) Chamber Pressure (ATA 1 2.5 2.5 2.5 2.5 2.5 - - 2.5 1 ) Clock Time (24 hr) 07:53 08:02 08:32 08:37 09:07 09:12 - - 09:42 09:51 Treatment Length: 118 (minutes) Treatment Segments: 4 Vital Signs Capillary [name] Reference Range: 80 - 120 mg / dl HBO Diabetic [name] Intervention Range: <131 mg/dl or >249 mg/dl Type: Time Vitals [name] Respiratory Capillary [name] Pulse Action Pulse: Temperature: Taken: Pressure: Rate: Glucose (mg/dl): Meter #: Oximetry (%) Taken: Pre 07:39 119/68 88 18 98.4 221 none per protocol Post 09:59 152/76 81 18 98.2 220 Treatment Response Treatment Toleration: Well Treatment Completion Status: Treatment Completed without Adverse Event Treatment Notes Patient arrived, [name] level was 221 mg/dL, other vital signs were normal.  Patient prepared for treatment. After performing safety check patient was placed in the chamber which was compressed with 100% oxygen at Bill rate of 2 psi/min after confirming normal ear equalization until reaching treatment depth of 2.5 ATA. [facility]) Physician HBO Attestation: I certify that I supervised this HBO treatment in accordance with Medicare guidelines. Bill trained emergency response team is readily available per Yes hospital policies and procedures. Continue HBOT as ordered. Yes Electronic Signature(s) Signed: 02/19/2023 4:58:12 PM By: [name] DO Previous Signature: 02/19/2023 11:45:53 AM Version By: Valeria Batman EMT Previous Signature: 02/19/2023 8:50:45 AM Version By: [name] , , Entered By: [name] on 02/19/2023 16:56:49 -------------------------------------------------------------------------------- HBO Safety Checklist Details Patient Name: Date of Service: [name], [name] 02/19/2023 8:00 Bill M Medical Record Number: [id number] Patient Account Number: [id number] Date of Birth/Sex: Treating RN: 01/09/1946 (77 y.o. [name] Primary Care [name]: [name], [name] Other Clinician: Dantavius, Botta, [name] [id number] Page 2 of 2 Referring [name]: Treating Glendon Fiser/Extender: [name], [name], [name] in Treatment: 8 HBO Safety Checklist Items Safety Checklist Consent Form Signed Patient voided / foley secured and emptied When did you last eato 0630 Last dose of injectable or oral agent 0640 Ostomy pouch emptied and vented if applicable NA All implantable devices assessed, documented and approved NA Intravenous access site secured and place NA Valuables secured Linens and cotton and cotton/polyester blend (less than 51% polyester) Personal oil-based products / skin lotions / body lotions removed Wigs or hairpieces removed NA Smoking or tobacco materials  removed NA Books / newspapers / magazines / loose paper removed Cologne, aftershave, perfume and deodorant removed Jewelry removed (may wrap wedding band) Make-up removed Hair care products removed Battery operated devices (external) removed Heating patches and chemical warmers removed Titanium eyewear removed Nail polish  cured greater than 10 hours NA Casting material cured greater than 10 hours NA Hearing aids removed NA Loose dentures or partials removed NA Prosthetics have been removed NA Patient demonstrates correct use of air break device (if applicable) Patient concerns have been addressed Patient grounding bracelet on and cord attached to chamber Specifics for Inpatients (complete in addition to above) Medication sheet sent with patient NA Intravenous medications needed or due during therapy sent with patient NA Drainage tubes (e.g. nasogastric tube or chest tube secured and vented) NA Endotracheal or Tracheotomy tube secured NA Cuff deflated of air and inflated with saline NA Airway suctioned NA Notes Paper version used prior to treatment start. Electronic Signature(s) Signed: 02/19/2023 8:47:13 AM By: [name] , , Entered By: [name] on 02/19/2023 08:47:13

## 2023-02-20 NOTE — Progress Notes (Signed)
ARMARI, POINT ([id number] Page 1 of 2 Visit Report for 02/19/2023 Arrival Information Details Patient Name: Date of Service: [name] 02/19/2023 8:00 A M Medical Record Number: [id number] Patient Account Number: [id number] Date of Birth/Sex: Treating RN: 09-09-1946 (77 y.o. [name] Primary [name] Bill Dewitt: [name], [name] Other Clinician: [name] Referring [name]: Treating [name]/Extender: [name], [name], [name] Weeks in Treatment: 8 Visit Information History Since Last Visit All ordered tests and consults were completed: Yes Patient Arrived: [name] Added or deleted any medications: No Arrival Time: 07:30 Any new allergies or adverse reactions: No Accompanied By: self Had a fall or experienced change in No Transfer Assistance: None activities of daily living that may affect Patient Identification Verified: Yes risk of falls: Secondary Verification Process Completed: Yes Signs or symptoms of abuse/neglect since last visito No Patient Requires Transmission-Based Precautions: No Hospitalized since last visit: No Patient Has Alerts: Yes Implantable device outside of the clinic excluding No Patient Alerts: Patient on [name] cellular tissue based products placed in the center since last visit: Pain Present Now: No Electronic Signature(s) Signed: 02/19/2023 8:45:48 AM By: [name] , , Entered By: [name] on 02/19/2023 08:45:48 -------------------------------------------------------------------------------- Encounter Discharge Information Details Patient Name: Date of Service: Bill [facility], [name] 02/19/2023 8:00 A M Medical Record Number: [id number] Patient Account Number: [id number] Date of Birth/Sex: Treating RN: [name] Bill Lockridge: [name], [name] Other Clinician: [name] Referring Metha Kolasa: Treating  [name]/Extender: [name], [name], [name] Weeks in Treatment: 8 Encounter Discharge Information Items Discharge Condition: Stable Ambulatory Status: Walker Discharge Destination: Home Transportation: Private Auto Accompanied By: None Schedule Follow-up Appointment: Yes Clinical Summary of [name]: Electronic Signature(s) Signed: 02/19/2023 11:47:12 AM By: [name] EMT Entered By: [name] on 02/19/2023 11:47:12 -------------------------------------------------------------------------------- Vitals Details Patient Name: Date of Service: Bill [place], [name] 02/19/2023 8:00 A M Medical Record Number: [id number] Patient Account Number: [id number] Date of Birth/Sex: Treating RN: Bill 08, 1947 (77 y.o. [name] Primary [name] Maryana Pittmon: [name], [name] Other Clinician: [name] Referring Calyn Sivils: Treating Randel Hargens/Extender: [name], [name], [name] Weeks in Treatment: 8 Vital Signs Time Taken: 07:39 Temperature (F): 98.4 [name] ([id number]) [id number] Page 2 of 2 Height (in): 71 Pulse (bpm): 88 Weight (lbs): 252 Respiratory Rate (breaths/min): 18 Body Mass Index (BMI): 35.1 [name]: 119/68 Capillary [name]: 221 Reference Range: 80 - 120 mg / dl Electronic Signature(s) Signed: 02/19/2023 8:46:10 AM By: [name] , , Entered By: [name] on 02/19/2023 08:46:10

## 2023-02-20 NOTE — Progress Notes (Signed)
ANGELIS, TOMICH ([id number]) [id number] Page 1 of 1 Visit Report for 02/18/2023 [name] Details Patient Name: Date of Service: [name] 02/18/2023 Medical Record Number: [id number] Patient Account Number: [id number] Date of Birth/Sex: Treating RN: 04/25/1946 (77 y.o. 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 C61 Malignant neoplasm of prostate E11.621 Type 2 diabetes mellitus with foot ulcer L97.512 Non-pressure chronic ulcer of other part of right foot with fat layer exposed Facility Procedures CPT4 Code Description Modifier Quantity [id number]-(Facility Use Only) HBOT full body chamber, 45mn , 4 ICD-10 Diagnosis Description N30.41 Irradiation cystitis with hematuria C61 Malignant neoplasm of prostate E11.621 Type 2 diabetes mellitus with foot ulcer Physician Procedures Quantity CPT4 Code Description Modifier [id number] PHYS HYPERBARIC OXYGEN THERAPY 1 ICD-10 Diagnosis Description N30.41 Irradiation cystitis with hematuria C61 Malignant neoplasm of prostate E11.621 Type 2 diabetes mellitus with foot ulcer Electronic Signature(s) Signed: 02/18/2023 4:28:18 PM By: SDonavan BurnetCHT EMT BS , , Signed: 02/19/2023 7:44:12 AM By: RLinton HamMD Entered By: SDonavan Burneton 02/18/2023 16:28:18

## 2023-02-21 NOTE — Progress Notes (Signed)
ACEN, MCVEIGH ([id number] Page 1 of 2 Visit Report for 02/20/2023 Arrival Information Details Patient Name: Date of Service: [name] 02/20/2023 8:00 A M Medical Record Number: [id number] Patient Account Number: [id number] Date of Birth/Sex: Treating RN: January 25, 1946 (77 y.o. [name] Primary Care Lynden Flemmer: [name], [name] Other Clinician: [name] Referring Leibish Mcgregor: Treating Cope Marte/Extender: [name], [name], [name] in Treatment: 8 Visit Information History Since Last Visit All ordered tests and consults were completed: Yes Patient Arrived: [name] Added or deleted any medications: No Arrival Time: 07:30 Any new allergies or adverse reactions: No Accompanied By: self Had a fall or experienced change in No Transfer Assistance: Manual activities of daily living that may affect Patient Identification Verified: Yes risk of falls: Secondary Verification Process Completed: Yes Signs or symptoms of abuse/neglect since last visito No Patient Requires Transmission-Based Precautions: No Hospitalized since last visit: No Patient Has Alerts: Yes Implantable device outside of the clinic excluding No Patient Alerts: Patient on Blood Thinner cellular tissue based products placed in the center since last visit: Pain Present Now: No Electronic Signature(s) Signed: 02/20/2023 9:02:27 AM By: [name] , , Previous Signature: 02/20/2023 8:59:38 AM Version By: [name] , , Entered By: [name] on 02/20/2023 09:02:27 -------------------------------------------------------------------------------- Encounter Discharge Information Details Patient Name: Date of Service: [facility], [name] 02/20/2023 8:00 A M Medical Record Number: [id number] Patient Account Number: [id number] Date of Birth/Sex: Treating RN: Jun 30, 1946 (77 y.o. [name] Primary Care Danille Oppedisano:  [name], [name] Other Clinician: Valeria Batman Referring Lorimer Tiberio: Treating Mellany Dinsmore/Extender: [name], [name], [name] in Treatment: 8 Encounter Discharge Information Items Discharge Condition: Stable Ambulatory Status: Walker Discharge Destination: Home Transportation: Private Auto Accompanied By: None Schedule Follow-up Appointment: Yes Clinical Summary of Care: Electronic Signature(s) Signed: 02/20/2023 2:21:37 PM By: Valeria Batman EMT Entered By: Valeria Batman on 02/20/2023 14:21:36 -------------------------------------------------------------------------------- Vitals Details Patient Name: Date of Service: [place], [name] 02/20/2023 8:00 A M Medical Record Number: [id number] Patient Account Number: [id number] Date of Birth/Sex: Treating RN: 02-29-1946 (77 y.o. [name] Primary Care Inmer Nix: [name], [name] Other Clinician: Valeria Batman Referring Lashawnta Burgert: Treating Netanya Yazdani/Extender: [name], [name], [name] in Treatment: 8 Vital Signs Time Taken: 07:43 Temperature (F): 97.9 KAMAR, STEINBACHER L ([id number]) [id number].pdf Page 2 of 2 Height (in): 71 Pulse (bpm): 80 Weight (lbs): 252 Respiratory Rate (breaths/min): 18 Body Mass Index (BMI): 35.1 Blood Pressure (mmHg): 124/68 Capillary Blood Glucose (mg/dl): 194 Reference Range: 80 - 120 mg / dl Electronic Signature(s) Signed: 02/20/2023 9:02:32 AM By: [name] , , Previous Signature: 02/20/2023 9:02:11 AM Version By: [name] , , Entered By: [name] on 02/20/2023 09:02:32

## 2023-02-21 NOTE — Progress Notes (Signed)
[name], [name] ([id number].pdf Page 1 of 2 Visit Report for 02/20/2023 HBO Details Patient Name: Date of Service: [name]. 02/20/2023 8:00 A M Medical Record Number: [id number] Patient Account Number: [id number] Date of Birth/Sex: Treating RN: Jul 06, 1946 (77 y.o. [name] Primary Care Yacoub Diltz: [name], [name] Other Clinician: Valeria Batman Referring Margery Szostak: Treating Deston Bilyeu/Extender: [name], [name], [name] in Treatment: 8 HBO Treatment Course Details Treatment Course Number: 1 Ordering Diavian Furgason: [name] T Treatments Ordered: otal 40 HBO Treatment Start Date: 01/05/2023 HBO Indication: Late Effect of Radiation HBO Treatment Details Treatment Number: 31 Patient Type: Outpatient Chamber Type: Monoplace Chamber Serial #: [id number] Treatment Protocol: 2.5 ATA with 90 minutes oxygen, with two 5 minute air breaks Treatment Details Compression Rate Down: 1.5 psi / minute De-Compression Rate Up: A breaks and breathing ir Compress Tx Pressure periods Decompress Decompress Begins Reached (leave unused spaces Begins Ends blank) Chamber Pressure (ATA 1 2.5 2.5 2.5 2.5 2.5 - - 2.5 1 ) Clock Time (24 hr) 07:53 08:06 08:36 08:41 09:11 09:16 - - 09:46 09:57 Treatment Length: 124 (minutes) Treatment Segments: 4 Vital Signs Capillary [name] Reference Range: 80 - 120 mg / dl HBO Diabetic [name] Intervention Range: <131 mg/dl or >249 mg/dl Time Vitals [name] Respiratory Capillary [name] Pulse Action Type: Pulse: Temperature: Taken: Pressure: Rate: Glucose (mg/dl): Meter #: Oximetry (%) Taken: Pre 07:43 124/68 80 18 97.9 194 Post 10:04 148/75 77 18 97.5 254 Treatment Response Treatment Toleration: Well Treatment Completion Status: Treatment Completed without Adverse Event Treatment Notes Patient arrived, [name] level was 194 mg/dL. His other vital signs were normal. Patient prepared  for treatment. After performing safety check patient was placed in the chamber which was compressed with 100% oxygen at a rate of 2 psi/min after confirming normal ear equalization until reaching treatment depth of 2.5 ATA. [facility]) Physician HBO Attestation: I certify that I supervised this HBO treatment in accordance with Medicare guidelines. A trained emergency response team is readily available per Yes hospital policies and procedures. Continue HBOT as ordered. Yes Electronic Signature(s) Signed: 02/24/2023 3:47:22 PM By: [name] DO Previous Signature: 02/20/2023 2:20:34 PM Version By: Valeria Batman EMT Previous Signature: 02/20/2023 9:06:38 AM Version By: [name] , , Entered By: [name] on 02/24/2023 09:10:49 -------------------------------------------------------------------------------- HBO Safety Checklist Details Patient Name: Date of Service: [name], [name] 02/20/2023 8:00 A M Medical Record Number: [id number] Patient Account Number: [id number] Date of Birth/Sex: Treating RN: 09/21/1946 (77 y.o. [name] Primary Care [name]: [name], [name] Other Clinician: Rune, [name], [name] ([id number]) [id number] Page 2 of 2 Referring Washington Whedbee: Treating Alyan Hartline/Extender: [name], [name], [name] in Treatment: 8 HBO Safety Checklist Items Safety Checklist Consent Form Signed Patient voided / foley secured and emptied When did you last eato 0630 Last dose of injectable or oral agent 0640 Ostomy pouch emptied and vented if applicable NA All implantable devices assessed, documented and approved NA Intravenous access site secured and place NA Valuables secured Linens and cotton and cotton/polyester blend (less than 51% polyester) Personal oil-based products / skin lotions / body lotions removed Wigs or hairpieces removed NA Smoking or tobacco materials removed NA Books /  newspapers / magazines / loose paper removed Cologne, aftershave, perfume and deodorant removed Jewelry removed (may wrap wedding band) Make-up removed NA Hair care products removed Battery operated devices (external) removed Heating patches and chemical warmers removed Titanium eyewear removed Nail polish cured  greater than 10 hours NA Casting material cured greater than 10 hours NA Hearing aids removed NA Loose dentures or partials removed NA Prosthetics have been removed NA Patient demonstrates correct use of air break device (if applicable) Patient concerns have been addressed Patient grounding bracelet on and cord attached to chamber Specifics for Inpatients (complete in addition to above) Medication sheet sent with patient NA Intravenous medications needed or due during therapy sent with patient NA Drainage tubes (e.g. nasogastric tube or chest tube secured and vented) NA Endotracheal or Tracheotomy tube secured NA Cuff deflated of air and inflated with saline NA Airway suctioned NA Notes Paper version used prior to treatment start. Electronic Signature(s) Signed: 02/20/2023 9:05:32 AM By: [name] , , Entered By: [name] on 02/20/2023 09:05:32

## 2023-02-21 NOTE — Progress Notes (Signed)
QUINTARUS, CRAVENS ([id number] Page 1 of 2 Visit Report for 02/19/2023 Problem List Details Patient Name: Date of Service: [name] 02/19/2023 8:00 A M Medical Record Number: [id number] Patient Account Number: [id number] Date of Birth/Sex: Treating RN: 1946/09/13 (77 y.o. 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 12/22/2022 No Yes C61 Malignant neoplasm of prostate 12/22/2022 No Yes E11.621 Type 2 diabetes mellitus with foot ulcer 01/22/2023 No Yes L97.512 Non-pressure chronic ulcer of other part of right foot with fat 01/22/2023 No Yes layer exposed Inactive Problems Resolved Problems Electronic Signature(s) Signed: 02/19/2023 11:46:40 AM By: Valeria Batman EMT Signed: 02/19/2023 4:58:12 PM By: [name] DO Entered By: Valeria Batman on 02/19/2023 11:46:39 -------------------------------------------------------------------------------- [name] Details Patient Name: Date of Service: SPA Dominica Severin, [name] 02/19/2023 Medical Record Number: [id number] Patient Account Number: [id number] Date of Birth/Sex: Treating RN: 1946/11/28 (77 y.o. [name] Primary Care Provider: [name], [name] Other Clinician: [name] Referring Provider: Treating Provider/Extender: Green Quincy [name], [name] in Treatment: 8 Diagnosis Coding ICD-10 Codes Code Description N30.41 Irradiation cystitis with hematuria I10 Essential (primary) hypertension Ellerson, Natalio L ([id number]) [id number].pdf Page 2 of 2 E11.21  Type 2 diabetes mellitus with diabetic nephropathy C61 Malignant neoplasm of prostate E11.621 Type 2 diabetes mellitus with foot ulcer L97.512 Non-pressure chronic ulcer of other part of right foot with fat layer exposed Facility Procedures : CPT4 Code: [id number] Description: G0277-(Facility Use Only) HBOT full body chamber, 75mn , ICD-10 Diagnosis Description N30.41 Irradiation cystitis with hematuria C61 Malignant 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

## 2023-02-23 ENCOUNTER — Encounter (HOSPITAL_BASED_OUTPATIENT_CLINIC_OR_DEPARTMENT_OTHER): Payer: Medicare Other | Admitting: General Surgery

## 2023-02-23 DIAGNOSIS — E11621 Type 2 diabetes mellitus with foot ulcer: Secondary | ICD-10-CM | POA: Diagnosis not present

## 2023-02-23 DIAGNOSIS — L97512 Non-pressure chronic ulcer of other part of right foot with fat layer exposed: Secondary | ICD-10-CM | POA: Diagnosis not present

## 2023-02-23 DIAGNOSIS — E1122 Type 2 diabetes mellitus with diabetic chronic kidney disease: Secondary | ICD-10-CM | POA: Diagnosis not present

## 2023-02-23 DIAGNOSIS — I129 Hypertensive chronic kidney disease with stage 1 through stage 4 chronic kidney disease, or unspecified chronic kidney disease: Secondary | ICD-10-CM | POA: Diagnosis not present

## 2023-02-23 DIAGNOSIS — L598 Other specified disorders of the skin and subcutaneous tissue related to radiation: Secondary | ICD-10-CM | POA: Diagnosis not present

## 2023-02-23 DIAGNOSIS — E1121 Type 2 diabetes mellitus with diabetic nephropathy: Secondary | ICD-10-CM | POA: Diagnosis not present

## 2023-02-23 DIAGNOSIS — N189 Chronic kidney disease, unspecified: Secondary | ICD-10-CM | POA: Diagnosis not present

## 2023-02-23 LAB — GLUCOSE, CAPILLARY
Glucose-Capillary: 211 mg/dL — ABNORMAL HIGH (ref 70–99)
Glucose-Capillary: 215 mg/dL — ABNORMAL HIGH (ref 70–99)

## 2023-02-23 NOTE — Progress Notes (Signed)
[name], [name] ([id number] Page 1 of 2 Visit Report for 02/23/2023 HBO Details Patient Name: Date of Service: [name] 02/23/2023 8:00 A M Medical Record Number: [id number] Patient Account Number: [id number] Date of Birth/Sex: Treating RN: [birth year] (77 y.o. [name], [name] Primary Care Tirza Senteno: Bill, [name] Other Clinician: [name] Referring Cartrell Bentsen: Treating Jessikah Dicker/Extender: Cannon, Jennifer Bill, [name] Weeks in Treatment: 9 HBO Treatment Course Details Treatment Course Number: 1 Ordering Johnathan Tortorelli: [name] T Treatments Ordered: otal 40 HBO Treatment Start Date: 01/05/2023 HBO Indication: Late Effect of Radiation HBO Treatment Details Treatment Number: 32 Patient Type: Outpatient Chamber Type: Monoplace Chamber Serial #: [id number] Treatment Protocol: 2.5 ATA with 90 minutes oxygen, with two 5 minute air breaks Treatment Details Compression Rate Down: 2.0 psi / minute De-Compression Rate Up: 2.0 psi / minute A breaks and breathing ir Compress Tx Pressure periods Decompress Decompress Begins Reached (leave unused spaces Begins Ends blank) Chamber Pressure (ATA 1 2.5 2.5 2.5 2.5 2.5 - - 2.5 1 ) Clock Time (24 hr) 07:54 08:08 08:38 08:41 09:11 09:16 - - 09:46 09:57 Treatment Length: 123 (minutes) Treatment Segments: 4 Vital Signs Capillary Blood Glucose Reference Range: 80 - 120 mg / dl HBO Diabetic Blood Glucose Intervention Range: <131 mg/dl or >249 mg/dl Type: Time Vitals Blood Respiratory Capillary Blood Glucose Pulse Action Pulse: Temperature: Taken: Pressure: Rate: Glucose (mg/dl): Meter #: Oximetry (%) Taken: Pre 07:47 135/69 84 18 98.9 211 1 none per protocol Post 09:59 151/84 79 18 98.4 215 1 none per protocol Treatment Response Treatment Toleration: Well Treatment Completion Status: Treatment Completed without Adverse Event Treatment Notes Patient arrived, prepared for treatment.  Vital signs were within normal range. After performing safety check, patient was placed in the chamber which was compressed at 2 psi/min after confirming normal ear equalization. Mr. Eisenberger tolerated treatment and the subsequent decompression of the chamber at a rate of 2 psi/min. Patient denied any symptoms of barotrauma stating that he felt fine. Patient was stable upon discharge. Physician HBO Attestation: I certify that I supervised this HBO treatment in accordance with Medicare guidelines. A trained emergency response team is readily available per Yes hospital policies and procedures. Continue HBOT as ordered. Yes Electronic Signature(s) Signed: 02/24/2023 7:42:01 AM By: Fredirick Maudlin MD FACS Previous Signature: 02/23/2023 10:31:43 AM Version By: [name] , , Previous Signature: 02/23/2023 9:12:30 AM Version By: [name] , , Entered By: Fredirick Maudlin on 02/24/2023 07:42:01 -------------------------------------------------------------------------------- HBO Safety Checklist Details Patient Name: Date of Service: [name], [name] 02/23/2023 8:00 [place] Record Number: [id number] Patient Account Number: [id number] Date of Birth/Sex: Treating RN: 1946-11-01 (77 y.o. [name] Primary Care Cristal Qadir: Bill, [name] Other Clinician: Zakar, Kan[place], [name] ([id number]) [id number] Page 2 of 2 Referring Chelby Salata: Treating [name]/Extender: Cannon, Jennifer Bill, [name] Weeks in Treatment: 9 HBO Safety Checklist Items Safety Checklist Consent Form Signed Patient voided / foley secured and emptied When did you last eato 0630 Last dose of injectable or oral agent 0640 Ostomy pouch emptied and vented if applicable NA All implantable devices assessed, documented and approved NA Intravenous access site secured and place NA Valuables secured Linens and cotton and cotton/polyester blend (less  than 51% polyester) Personal oil-based products / skin lotions / body lotions removed Wigs or hairpieces removed NA Smoking or tobacco materials removed NA Books / newspapers / magazines / loose paper removed Cologne, aftershave, perfume and deodorant removed Jewelry  removed (may wrap wedding band) Make-up removed NA Hair care products removed Battery operated devices (external) removed Heating patches and chemical warmers removed Titanium eyewear removed Nail polish cured greater than 10 hours NA Casting material cured greater than 10 hours NA Hearing aids removed NA Loose dentures or partials removed NA Prosthetics have been removed NA Patient demonstrates correct use of air break device (if applicable) Patient concerns have been addressed Patient grounding bracelet on and cord attached to chamber Specifics for Inpatients (complete in addition to above) Medication sheet sent with patient NA Intravenous medications needed or due during therapy sent with patient NA Drainage tubes (e.g. nasogastric tube or chest tube secured and vented) NA Endotracheal or Tracheotomy tube secured NA Cuff deflated of air and inflated with saline NA Airway suctioned NA Notes Paper version used prior to treatment start. Electronic Signature(s) Signed: 02/23/2023 9:11:22 AM By: [name] , , Entered By: [name] on 02/23/2023 [id number]

## 2023-02-23 NOTE — Progress Notes (Addendum)
ELLINGTON, KANEKO ([id number]) [id number] Page 1 of 2 Visit Report for 02/23/2023 Arrival Information Details Patient Name: Date of Service: [name]. 02/23/2023 8:00 [name] Medical Record Number: [id number] Patient Account Number: [id number] Date of Birth/Sex: Treating RN: 11-22-1946 (77 y.o. [name] Primary Care Kytzia Gienger: [name], [name] Other Clinician: [name] Referring Deshonda Cryderman: Treating Benny Henrie/Extender: Cannon, Jennifer [name], [name] in Treatment: 9 Visit Information History Since Last Visit All ordered tests and consults were completed: Yes Patient Arrived: [name] Added or deleted any medications: No Arrival Time: 07:30 Any new allergies or adverse reactions: No Accompanied By: self Had Bill fall or experienced change in No Transfer Assistance: None activities of daily living that may affect Patient Identification Verified: Yes risk of falls: Secondary Verification Process Completed: Yes Signs or symptoms of abuse/neglect since last visito No Patient Requires Transmission-Based Precautions: No Hospitalized since last visit: No Patient Has Alerts: Yes Implantable device outside of the clinic excluding No Patient Alerts: Patient on [name] cellular tissue based products placed in the center since last visit: Pain Present Now: No Electronic Signature(s) Signed: 02/23/2023 9:09:11 AM By: [name] , , Entered By: [name] on 02/23/2023 09:09:10 -------------------------------------------------------------------------------- Encounter Discharge Information Details Patient Name: Date of Service: [facility], [name] 02/23/2023 8:00 [name] Medical Record Number: [id number] Patient Account Number: [id number] Date of Birth/Sex: Treating RN: 06/22/1946 (77 y.o. [name] Primary Care Harly Pipkins: [name], [name] Other Clinician: [name] Referring Pami Wool: Treating  [name]/Extender: Cannon, Jennifer [name], [name] in Treatment: 9 Encounter Discharge Information Items Discharge Condition: Stable Ambulatory Status: Walker Discharge Destination: Home Transportation: Private Auto Accompanied By: self Schedule Follow-up Appointment: No Clinical Summary of Care: Electronic Signature(s) Signed: 02/23/2023 10:32:34 AM By: [name] , , Entered By: [name] on 02/23/2023 10:32:33 -------------------------------------------------------------------------------- Vitals Details Patient Name: Date of Service: [name], [name] 02/23/2023 8:00 [name] Medical Record Number: [id number] Patient Account Number: [id number] Date of Birth/Sex: Treating RN: Jan 20, 1946 (77 y.o. [name] Primary Care Jurnie Garritano: [name], [name] Other Clinician: [name] Referring Gavinn Collard: Treating [name]/Extender: Cannon, Jennifer [name], [name] in Treatment: 9 Vital Signs Time Taken: 07:47 Temperature (F): 98.9 Hrivnak, [name] ([id number]) [id number] Page 2 of 2 Height (in): 71 Pulse (bpm): 84 Weight (lbs): 252 Respiratory Rate (breaths/min): 18 Body Mass Index (BMI): 35.1 [name]: 135/69 Capillary [name]: 211 Reference Range: 80 - 120 mg / dl Electronic Signature(s) Signed: 02/23/2023 10:32:49 AM By: [name] , , Previous Signature: 02/23/2023 9:10:20 AM Version By: [name] , , Entered By: [name] on 02/23/2023 10:32:49

## 2023-02-24 ENCOUNTER — Encounter (HOSPITAL_BASED_OUTPATIENT_CLINIC_OR_DEPARTMENT_OTHER): Payer: Medicare Other | Admitting: General Surgery

## 2023-02-24 DIAGNOSIS — E11621 Type 2 diabetes mellitus with foot ulcer: Secondary | ICD-10-CM | POA: Diagnosis not present

## 2023-02-24 DIAGNOSIS — L97512 Non-pressure chronic ulcer of other part of right foot with fat layer exposed: Secondary | ICD-10-CM | POA: Diagnosis not present

## 2023-02-24 DIAGNOSIS — L598 Other specified disorders of the skin and subcutaneous tissue related to radiation: Secondary | ICD-10-CM | POA: Diagnosis not present

## 2023-02-24 DIAGNOSIS — E1122 Type 2 diabetes mellitus with diabetic chronic kidney disease: Secondary | ICD-10-CM | POA: Diagnosis not present

## 2023-02-24 DIAGNOSIS — N189 Chronic kidney disease, unspecified: Secondary | ICD-10-CM | POA: Diagnosis not present

## 2023-02-24 DIAGNOSIS — E1121 Type 2 diabetes mellitus with diabetic nephropathy: Secondary | ICD-10-CM | POA: Diagnosis not present

## 2023-02-24 DIAGNOSIS — I129 Hypertensive chronic kidney disease with stage 1 through stage 4 chronic kidney disease, or unspecified chronic kidney disease: Secondary | ICD-10-CM | POA: Diagnosis not present

## 2023-02-24 LAB — GLUCOSE, CAPILLARY
Glucose-Capillary: 174 mg/dL — ABNORMAL HIGH (ref 70–99)
Glucose-Capillary: 157 mg/dL — ABNORMAL HIGH (ref 70–99)

## 2023-02-24 NOTE — Progress Notes (Signed)
ALGIRD, RIETZ ([id number]) [id number] Page 1 of 2 Visit Report for 02/24/2023 Arrival Information Details Patient Name: Date of Service: [name] 02/24/2023 8:00 [name] Medical Record Number: [id number] Patient Account Number: [id number] Date of Birth/Sex: Treating RN: 1946-06-01 (77 y.o. [name], [name] Primary Care Ramar Nobrega: [name] Other Clinician: [name] Referring Avian Greenawalt: Treating Mylisa Brunson/Extender: Cannon, Jennifer [name] Weeks in Treatment: 9 Visit Information History Since Last Visit All ordered tests and consults were completed: Yes Patient Arrived: [name] Added or deleted any medications: No Arrival Time: 07:30 Any new allergies or adverse reactions: No Accompanied By: self Had Bill fall or experienced change in No Transfer Assistance: None activities of daily living that may affect Patient Identification Verified: Yes risk of falls: Secondary Verification Process Completed: Yes Signs or symptoms of abuse/neglect since last visito No Patient Requires Transmission-Based Precautions: No Hospitalized since last visit: No Patient Has Alerts: Yes Implantable device outside of the clinic excluding No Patient Alerts: Patient on [name] cellular tissue based products placed in the center since last visit: Pain Present Now: No Electronic Signature(s) Signed: 02/24/2023 12:01:25 PM By: [name] , , Entered By: [name] on 02/24/2023 12:01:25 -------------------------------------------------------------------------------- Encounter Discharge Information Details Patient Name: Date of Service: [name], [name] 02/24/2023 8:00 [name] Medical Record Number: [id number] Patient Account Number: [id number] Date of Birth/Sex: Treating RN: Apr 20, 1946 (77 y.o. [name], [name] Primary Care Alyric Parkin: [name] Other Clinician: [name] Referring Aishwarya Shiplett: Treating  Zenovia Justman/Extender: Cannon, Jennifer [name] Weeks in Treatment: 9 Encounter Discharge Information Items Discharge Condition: Stable Ambulatory Status: Walker Discharge Destination: Home Transportation: Private Auto Accompanied By: self Schedule Follow-up Appointment: No Clinical Summary of Care: Electronic Signature(s) Signed: 02/24/2023 12:05:50 PM By: [name] , , Entered By: [name] on 02/24/2023 12:05:50 -------------------------------------------------------------------------------- Vitals Details Patient Name: Date of Service: [name], [name] 02/24/2023 8:00 [name] Medical Record Number: [id number] Patient Account Number: [id number] Date of Birth/Sex: Treating RN: October 01, 1946 (77 y.o. Erie Noe Primary Care Henning Ehle: [name] Other Clinician: [name] Referring Steadman Prosperi: Treating Townsend Cudworth/Extender: Cannon, Jennifer [name] Weeks in Treatment: 9 Vital Signs Time Taken: 07:58 Temperature (F): 97.2 [name] ([id number]) [id number] Page 2 of 2 Height (in): 71 Pulse (bpm): 78 Weight (lbs): 252 Respiratory Rate (breaths/min): 18 Body Mass Index (BMI): 35.1 [name]: 121/79 Capillary [name]: 174 Reference Range: 80 - 120 mg / dl Electronic Signature(s) Signed: 02/24/2023 12:01:59 PM By: [name] , , Entered By: [name] on 02/24/2023 12:01:58

## 2023-02-24 NOTE — Progress Notes (Signed)
[name], [name] ([id number]) [id number] Page 1 of 2 Visit Report for 02/24/2023 HBO Details Patient Name: Date of Service: [name]. 02/24/2023 8:00 A M Medical Record Number: [id number] Patient Account Number: [id number] Date of Birth/Sex: Treating RN: 04-24-1946 (77 y.o. [name], [place] Primary Care Gradie Ohm: [name], [name] Other Clinician: [name] Referring Corrie Reder: Treating Alexa Blish/Extender: Cannon, Jennifer [name], [name] in Treatment: 9 HBO Treatment Course Details Treatment Course Number: 1 Ordering Megean Fabio: [name] T Treatments Ordered: otal 40 HBO Treatment Start Date: 01/05/2023 HBO Indication: Late Effect of Radiation HBO Treatment Details Treatment Number: 33 Patient Type: Outpatient Chamber Type: Monoplace Chamber Serial #: [id number] Treatment Protocol: 2.5 ATA with 90 minutes oxygen, with two 5 minute air breaks Treatment Details Compression Rate Down: 1.5 psi / minute De-Compression Rate Up: 2.0 psi / minute A breaks and breathing ir Compress Tx Pressure periods Decompress Decompress Begins Reached (leave unused spaces Begins Ends blank) Chamber Pressure (ATA 1 2.5 2.5 2.5 2.5 2.5 - - 2.5 1 ) Clock Time (24 hr) 08:09 08:22 08:53 08:58 09:28 09:33 - - 10:02 10:13 Treatment Length: 124 (minutes) Treatment Segments: 4 Vital Signs Capillary [name] Reference Range: 80 - 120 mg / dl HBO Diabetic [name] Intervention Range: <131 mg/dl or >249 mg/dl Type: Time Vitals [name] Respiratory Capillary [name] Pulse Action Pulse: Temperature: Taken: Pressure: Rate: Glucose (mg/dl): Meter #: Oximetry (%) Taken: Pre 07:58 121/79 78 18 97.2 174 1 none per protocol Post 10:24 144/82 72 18 97.6 157 1 none per protocol Treatment Response Treatment Toleration: Well Treatment Completion Status: Treatment Completed without Adverse Event Treatment Notes Patient arrived, prepared for  treatment. Vital signs were within normal range. After performing safety check, patient was placed in the chamber which was compressed at 2 psi/min after confirming normal ear equalization. Mr. Krech tolerated treatment and the subsequent decompression of the chamber at a rate of 2 psi/min. Patient denied any symptoms of barotrauma stating that he felt fine. Patient was stable upon discharge. Physician HBO Attestation: I certify that I supervised this HBO treatment in accordance with Medicare guidelines. A trained emergency response team is readily available per Yes hospital policies and procedures. Continue HBOT as ordered. Yes Electronic Signature(s) Signed: 02/24/2023 1:19:44 PM By: Fredirick Maudlin MD FACS Previous Signature: 02/24/2023 12:05:03 PM Version By: [name] , , Entered By: Fredirick Maudlin on 02/24/2023 13:19:44 -------------------------------------------------------------------------------- HBO Safety Checklist Details Patient Name: Date of Service: [name], [name] 02/24/2023 8:00 A M Medical Record Number: [id number] Patient Account Number: [id number] Date of Birth/Sex: Treating RN: [birth year] (77 y.o. [name] Primary Care Dalyce Renne: [name], [name] Other Clinician: Antarius, Eklof ([id number]) [id number] Page 2 of 2 Referring Analie Katzman: Treating Terryn Rosenkranz/Extender: Cannon, Jennifer [name], [name] in Treatment: 9 HBO Safety Checklist Items Safety Checklist Consent Form Signed Patient voided / foley secured and emptied When did you last eato 0630 Last dose of injectable or oral agent 0640 Ostomy pouch emptied and vented if applicable NA All implantable devices assessed, documented and approved NA Intravenous access site secured and place NA Valuables secured Linens and cotton and cotton/polyester blend (less than 51% polyester) Personal oil-based products / skin lotions / body  lotions removed Wigs or hairpieces removed NA Smoking or tobacco materials removed NA Books / newspapers / magazines / loose paper removed Cologne, aftershave, perfume and deodorant removed Jewelry removed (may wrap wedding band) Make-up removed Hair care products removed Battery operated devices (  external) removed Heating patches and chemical warmers removed Titanium eyewear removed Nail polish cured greater than 10 hours NA Casting material cured greater than 10 hours NA Hearing aids removed NA Loose dentures or partials removed NA Prosthetics have been removed NA Patient demonstrates correct use of air break device (if applicable) Patient concerns have been addressed Patient grounding bracelet on and cord attached to chamber Specifics for Inpatients (complete in addition to above) Medication sheet sent with patient NA Intravenous medications needed or due during therapy sent with patient NA Drainage tubes (e.g. nasogastric tube or chest tube secured and vented) NA Endotracheal or Tracheotomy tube secured NA Cuff deflated of air and inflated with saline NA Airway suctioned NA Notes Paper version used prior to treatment start. Electronic Signature(s) Signed: 02/24/2023 12:03:17 PM By: [name] , , Entered By: [name] on 02/24/2023 12:03:17

## 2023-02-24 NOTE — Progress Notes (Signed)
KONSTANDINOS, SCULLEY ([id number]) [id number] Page 1 of 1 Visit Report for 02/23/2023 [name] Details Patient Name: Date of Service: [name] 02/23/2023 Medical Record Number: [id number] Patient Account Number: [id number] Date of Birth/Sex: Treating RN: 11-03-1946 (77 y.o. 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 C61 Malignant neoplasm of prostate E11.621 Type 2 diabetes mellitus with foot ulcer L97.512 Non-pressure chronic ulcer of other part of right foot with fat layer exposed Facility Procedures CPT4 Code Description Modifier Quantity [id number]-(Facility Use Only) HBOT full body chamber, 22mn , 4 ICD-10 Diagnosis Description 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

## 2023-02-24 NOTE — Progress Notes (Signed)
GIROLAMO, VEKSLER ([id number]) [id number] Page 1 of 1 Visit Report for 02/24/2023 [name] Details Patient Name: Date of Service: [name] 02/24/2023 Medical Record Number: [id number] Patient Account Number: [id number] Date of Birth/Sex: Treating RN: [birth year] (77 y.o. 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 C61 Malignant neoplasm of prostate E11.621 Type 2 diabetes mellitus with foot ulcer L97.512 Non-pressure chronic ulcer of other part of right foot with fat layer exposed Facility Procedures CPT4 Code Description Modifier Quantity [id number] G0277-(Facility Use Only) HBOT full body chamber, 15mn , 4 ICD-10 Diagnosis Description N30.41 Irradiation cystitis with hematuria C61 Malignant neoplasm of prostate E11.621 Type 2 diabetes mellitus with foot ulcer Physician Procedures Quantity CPT4 Code Description Modifier [id number] PHYS HYPERBARIC OXYGEN THERAPY 1 ICD-10 Diagnosis Description N30.41 Irradiation cystitis with hematuria C61 Malignant neoplasm of prostate E11.621 Type 2 diabetes mellitus with foot ulcer Electronic Signature(s) Signed: 02/24/2023 12:05:25 PM By: SDonavan BurnetCHT EMT BS , , Signed: 02/24/2023 1:18:05 PM By: CFredirick MaudlinMD FACS Entered By: SDonavan Burneton 02/24/2023 12:05:25

## 2023-02-25 ENCOUNTER — Encounter (HOSPITAL_BASED_OUTPATIENT_CLINIC_OR_DEPARTMENT_OTHER): Payer: Medicare Other | Admitting: Internal Medicine

## 2023-02-25 DIAGNOSIS — C61 Malignant neoplasm of prostate: Secondary | ICD-10-CM

## 2023-02-25 DIAGNOSIS — N189 Chronic kidney disease, unspecified: Secondary | ICD-10-CM | POA: Diagnosis not present

## 2023-02-25 DIAGNOSIS — N3041 Irradiation cystitis with hematuria: Secondary | ICD-10-CM

## 2023-02-25 DIAGNOSIS — I129 Hypertensive chronic kidney disease with stage 1 through stage 4 chronic kidney disease, or unspecified chronic kidney disease: Secondary | ICD-10-CM | POA: Diagnosis not present

## 2023-02-25 DIAGNOSIS — E11621 Type 2 diabetes mellitus with foot ulcer: Secondary | ICD-10-CM

## 2023-02-25 DIAGNOSIS — L97512 Non-pressure chronic ulcer of other part of right foot with fat layer exposed: Secondary | ICD-10-CM | POA: Diagnosis not present

## 2023-02-25 DIAGNOSIS — E1121 Type 2 diabetes mellitus with diabetic nephropathy: Secondary | ICD-10-CM | POA: Diagnosis not present

## 2023-02-25 DIAGNOSIS — E1122 Type 2 diabetes mellitus with diabetic chronic kidney disease: Secondary | ICD-10-CM | POA: Diagnosis not present

## 2023-02-25 LAB — GLUCOSE, CAPILLARY
Glucose-Capillary: 149 mg/dL — ABNORMAL HIGH (ref 70–99)
Glucose-Capillary: 171 mg/dL — ABNORMAL HIGH (ref 70–99)

## 2023-02-25 NOTE — Progress Notes (Signed)
KEYO, OPALEWSKI ([id number]) [id number] Page 1 of 2 Visit Report for 02/25/2023 Arrival Information Details Patient Name: Date of Service: [name] 02/25/2023 8:00 [name] Medical Record Number: [id number] Patient Account Number: [id number] Date of Birth/Sex: Treating RN: 08-01-1946 (77 y.o. [name] Primary Care [name]: [name] Other Clinician: Donavan [name] Referring [name]: Treating [name]/Extender: [name], [name], [name] Weeks in Treatment: 9 Visit Information History Since Last Visit All ordered tests and consults were completed: Yes Patient Arrived: [name] Added or deleted any medications: No Arrival Time: 07:30 Any new allergies or adverse reactions: No Accompanied By: self Had Bill fall or experienced change in No Transfer Assistance: None activities of daily living that may affect Patient Identification Verified: Yes risk of falls: Secondary Verification Process Completed: Yes Signs or symptoms of abuse/neglect since last visito No Patient Requires Transmission-Based Precautions: No Hospitalized since last visit: No Patient Has Alerts: Yes Implantable device outside of the clinic excluding No Patient Alerts: Patient on [name] cellular tissue based products placed in the center since last visit: Pain Present Now: No Electronic Signature(s) Signed: 02/25/2023 12:54:34 PM By: [name] CHT EMT BS , , Entered By: [name] on 02/25/2023 12:54:34 -------------------------------------------------------------------------------- Encounter Discharge Information Details Patient Name: Date of Service: [name], [name] 02/25/2023 8:00 [name] Medical Record Number: [id number] Patient Account Number: [id number] Date of Birth/Sex: Treating RN: June 01, 1946 (77 y.o. [name] Primary Care [name]: [name] Other Clinician: Donavan [name] Referring  [name]: Treating [name]/Extender: [name], [name], [name] Weeks in Treatment: 9 Encounter Discharge Information Items Discharge Condition: Stable Ambulatory Status: Ambulatory Discharge Destination: Home Transportation: Private Auto Accompanied By: self Schedule Follow-up Appointment: No Clinical Summary of Care: Electronic Signature(s) Signed: 02/25/2023 1:15:07 PM By: [name] CHT EMT BS , , Entered By: [name] on 02/25/2023 13:15:07 -------------------------------------------------------------------------------- Vitals Details Patient Name: Date of Service: [name], [name] 02/25/2023 8:00 [name] Medical Record Number: [id number] Patient Account Number: [id number] Date of Birth/Sex: Treating RN: [birth year] (77 y.o. [name] Primary Care [name]: [name] Other Clinician: Donavan [name] Referring [name]: Treating [name]/Extender: [name], [name], [name] Weeks in Treatment: 9 Vital Signs Time Taken: 08:05 Temperature (F): 97.7 [name], [name] ([id number]) [id number] Page 2 of 2 Height (in): 71 Pulse (bpm): 76 Weight (lbs): 252 Respiratory Rate (breaths/min): 18 Body Mass Index (BMI): 35.1 [name]: 116/60 Capillary [name]: 149 Reference Range: 80 - 120 mg / dl Electronic Signature(s) Signed: 02/25/2023 12:54:59 PM By: [name] CHT EMT BS , , Entered By: [name] on 02/25/2023 12:54:59

## 2023-02-25 NOTE — Progress Notes (Addendum)
Bill, [name] ([id number] Page 1 of 2 Visit Report for 02/25/2023 HBO Details Patient Name: Date of Service: [name]. 02/25/2023 8:00 Bill M Medical Record Number: [id number] Patient Account Number: [id number] Date of Birth/Sex: Treating RN: Jan 16, 1946 (77 y.o. [name] Primary Care [name]: [name] Other Clinician: [name] Referring Mckayla Mulcahey: Treating Shaquon Gropp/Extender: [name], Jessica [name] Weeks in Treatment: 9 HBO Treatment Course Details Treatment Course Number: 1 Ordering Holden Maniscalco: [name] T Treatments Ordered: otal 40 HBO Treatment Start Date: 01/05/2023 HBO Indication: Late Effect of Radiation HBO Treatment Details Treatment Number: 34 Patient Type: Outpatient Chamber Type: Monoplace Chamber Serial #: [id number] Treatment Protocol: 2.5 ATA with 90 minutes oxygen, with two 5 minute air breaks Treatment Details Compression Rate Down: 1.5 psi / minute De-Compression Rate Up: 2.0 psi / minute Bill breaks and breathing ir Compress Tx Pressure periods Decompress Decompress Begins Reached (leave unused spaces Begins Ends blank) Chamber Pressure (ATA 1 2.5 2.5 2.5 2.5 2.5 - - 2.5 1 ) Clock Time (24 hr) 08:20 08:33 09:03 09:08 09:38 09:43 - - 10:11 10:22 Treatment Length: 122 (minutes) Treatment Segments: 4 Vital Signs Capillary [name] Reference Range: 80 - 120 mg / dl HBO Diabetic [name] Intervention Range: <131 mg/dl or >249 mg/dl Type: Time Vitals [name] Respiratory Capillary [name] Pulse Action Pulse: Temperature: Taken: Pressure: Rate: Glucose (mg/dl): Meter #: Oximetry (%) Taken: Pre 08:05 116/60 76 18 97.7 149 1 none per protocol Post 10:32 152/77 73 18 98.6 171 1 none per protocol Treatment Response Treatment Toleration: Well Treatment Completion Status: Treatment Completed without Adverse Event Treatment Notes [name] arrived and prepared for  treatment. Vital signs were within normal range. After performing safety check, patient was placed in the chamber which was compressed at 2 psi/min after confirming normal ear equalization. Patient tolerated treatment and the subsequent decompression of the chamber at Bill rate of 2 psi/min. He denied any symptoms of barotrauma stating that he felt fine. Post treatment vital signs were within range as well. Patient was stable upon discharge. Physician HBO Attestation: I certify that I supervised this HBO treatment in accordance with Medicare guidelines. Bill trained emergency response team is readily available per Yes hospital policies and procedures. Continue HBOT as ordered. Yes Electronic Signature(s) Signed: 02/26/2023 3:34:03 PM By: [name] DO Previous Signature: 02/25/2023 1:12:35 PM Version By: [name] , , Previous Signature: 02/25/2023 1:11:55 PM Version By: [name] , , Entered By: [name] on 02/26/2023 12:49:55 -------------------------------------------------------------------------------- HBO Safety Checklist Details Patient Name: Date of Service: [name], [name] 02/25/2023 8:00 Bill M Medical Record Number: [id number] Patient Account Number: [id number] Date of Birth/Sex: Treating RN: [birth year] (77 y.o. [name] [name], [name] ([id number]) [id number] Page 2 of 2 Primary Care Dorien Bessent: [name] Other Clinician: [name] Referring Demitri Kucinski: Treating Renne Cornick/Extender: [name], Jessica [name] Weeks in Treatment: 9 HBO Safety Checklist Items Safety Checklist Consent Form Signed Patient voided / foley secured and emptied When did you last eato 0630 Last dose of injectable or oral agent 0640 Ostomy pouch emptied and vented if applicable NA All implantable devices assessed, documented and approved NA Intravenous access site secured and place NA Valuables  secured Linens and cotton and cotton/polyester blend (less than 51% polyester) Personal oil-based products / skin lotions / body lotions removed Wigs or hairpieces removed NA Smoking or tobacco materials removed NA Books / newspapers / magazines / loose  paper removed Cologne, aftershave, perfume and deodorant removed Jewelry removed (may wrap wedding band) Make-up removed Hair care products removed Battery operated devices (external) removed Heating patches and chemical warmers removed Titanium eyewear removed Nail polish cured greater than 10 hours NA Casting material cured greater than 10 hours NA Hearing aids removed NA Loose dentures or partials removed NA Prosthetics have been removed NA Patient demonstrates correct use of air break device (if applicable) Patient concerns have been addressed Patient grounding bracelet on and cord attached to chamber Specifics for Inpatients (complete in addition to above) Medication sheet sent with patient NA Intravenous medications needed or due during therapy sent with patient NA Drainage tubes (e.g. nasogastric tube or chest tube secured and vented) NA Endotracheal or Tracheotomy tube secured NA Cuff deflated of air and inflated with saline NA Airway suctioned NA Notes Paper version used prior to treatment start. Electronic Signature(s) Signed: 02/25/2023 12:55:50 PM By: [name] , , Entered By: [name] on 02/25/2023 12:55:50

## 2023-02-25 NOTE — Progress Notes (Signed)
ENRICO, HONEA ([id number]) [id number] Page 1 of 2 Visit Report for 02/20/2023 Problem List Details Patient Name: Date of Service: [name]. 02/20/2023 8:00 A M Medical Record Number: [id number] Patient Account Number: [id number] Date of Birth/Sex: Treating RN: 24-May-1946 (77 y.o. 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 02/20/2023 Medical Record Number: [id number] Patient Account Number: [id number] Date of Birth/Sex: Treating RN: 09/28/1946 (77 y.o. [name] Primary Care Provider: [name], [name] Other Clinician: Valeria Batman Referring Provider: Treating Provider/Extender: Saryah Loper [name], [name] in Treatment: 8 Diagnosis Coding ICD-10 Codes Code Description N30.41 Irradiation cystitis with hematuria I10 Essential (primary) hypertension [name], [name] ([id number]) [phone].pdf Page 2 of 2 E11.21 Type  2 diabetes mellitus with diabetic nephropathy C61 Malignant neoplasm of prostate E11.621 Type 2 diabetes mellitus with foot ulcer L97.512 Non-pressure chronic ulcer of other part of right foot with fat layer exposed Facility Procedures : CPT4 Code: [id number] Description: G0277-(Facility Use Only) HBOT full body chamber, 71mn , ICD-10 Diagnosis Description N30.41 Irradiation cystitis with hematuria C61 Malignant neoplasm of prostate E11.621 Type 2 diabetes mellitus with foot ulcer Modifier: Quantity: 4 Physician Procedures : CPT4 Code Description Modifier [id number] PHYS HYPERBARIC OXYGEN THERAPY ICD-10 Diagnosis Description N30.41 Irradiation cystitis with hematuria C61 Malignant neoplasm of prostate E11.621 Type 2 diabetes mellitus with foot ulcer Quantity: 1 Electronic Signature(s) Signed: 02/20/2023 2:21:00 PM By: GValeria BatmanEMT Signed: 02/24/2023 3:47:22 PM By: HKalman ShanDO Entered By: GValeria Batmanon 02/20/2023 14:20:59

## 2023-02-26 ENCOUNTER — Encounter (HOSPITAL_BASED_OUTPATIENT_CLINIC_OR_DEPARTMENT_OTHER): Payer: Medicare Other | Admitting: Internal Medicine

## 2023-02-26 DIAGNOSIS — E11621 Type 2 diabetes mellitus with foot ulcer: Secondary | ICD-10-CM | POA: Diagnosis not present

## 2023-02-26 DIAGNOSIS — N3041 Irradiation cystitis with hematuria: Secondary | ICD-10-CM

## 2023-02-26 DIAGNOSIS — C61 Malignant neoplasm of prostate: Secondary | ICD-10-CM | POA: Diagnosis not present

## 2023-02-26 DIAGNOSIS — I129 Hypertensive chronic kidney disease with stage 1 through stage 4 chronic kidney disease, or unspecified chronic kidney disease: Secondary | ICD-10-CM | POA: Diagnosis not present

## 2023-02-26 DIAGNOSIS — E1122 Type 2 diabetes mellitus with diabetic chronic kidney disease: Secondary | ICD-10-CM | POA: Diagnosis not present

## 2023-02-26 DIAGNOSIS — L97512 Non-pressure chronic ulcer of other part of right foot with fat layer exposed: Secondary | ICD-10-CM | POA: Diagnosis not present

## 2023-02-26 DIAGNOSIS — N189 Chronic kidney disease, unspecified: Secondary | ICD-10-CM | POA: Diagnosis not present

## 2023-02-26 DIAGNOSIS — E1121 Type 2 diabetes mellitus with diabetic nephropathy: Secondary | ICD-10-CM | POA: Diagnosis not present

## 2023-02-26 LAB — GLUCOSE, CAPILLARY
Glucose-Capillary: 174 mg/dL — ABNORMAL HIGH (ref 70–99)
Glucose-Capillary: 243 mg/dL — ABNORMAL HIGH (ref 70–99)

## 2023-02-26 NOTE — Progress Notes (Signed)
ALIK, HEINKE ([id number]) [id number] Page 1 of 2 Visit Report for 02/26/2023 Arrival Information Details Patient Name: Date of Service: SPA [name] [name]. 02/26/2023 8:00 Bill M Medical Record Number: [id number] Patient Account Number: [id number] Date of Birth/Sex: Treating RN: [birth year] (77 y.o. [name] Primary Care [name]: [name], Bill Other Clinician: [name] Referring [name]: Treating [name]/Extender: [name], [name], [name]: 9 Visit Information History Since Last Visit All ordered tests and consults were completed: Yes Patient Arrived: [name] Added or deleted any medications: No Arrival Time: 07:35 Any new allergies or adverse reactions: No Accompanied By: None Had Bill fall or experienced change in No Transfer Assistance: None activities of daily living that may affect Patient Identification Verified: Yes risk of falls: Secondary Verification Process Completed: Yes Signs or symptoms of abuse/neglect since last visito No Patient Requires Transmission-Based Precautions: No Hospitalized since last visit: No Patient Has Alerts: Yes Implantable device outside of the clinic excluding No Patient Alerts: Patient on [name] cellular tissue based products placed in the center since last visit: Pain Present Now: No Electronic Signature(s) Signed: 02/26/2023 12:29:42 PM By: [name] EMT Entered By: [name] on 02/26/2023 12:29:42 -------------------------------------------------------------------------------- Encounter Discharge Information Details Patient Name: Date of Service: [name], [name] 02/26/2023 8:00 Bill M Medical Record Number: [id number] Patient Account Number: [id number] Date of Birth/Sex: Treating RN: [birth year] (77 y.o. [name] Primary Care [name]: [name], Bill Other Clinician: [name] Referring [name]: Treating [name]/Extender: [name],  [name], [name]: 9 Encounter Discharge Information Items Discharge Condition: Stable Ambulatory Status: Walker Discharge Destination: Home Transportation: Private Auto Accompanied By: None Schedule Follow-up Appointment: Yes Clinical Summary of Care: Electronic Signature(s) Signed: 02/26/2023 12:34:36 PM By: [name] EMT Entered By: [name] on 02/26/2023 12:34:35 -------------------------------------------------------------------------------- Vitals Details Patient Name: Date of Service: [name], [name] 02/26/2023 8:00 Bill M Medical Record Number: [id number] Patient Account Number: [id number] Date of Birth/Sex: Treating RN: [birth year] (77 y.o. [name] Primary Care [name]: [name], Bill Other Clinician: [name] Referring [name]: Treating [name]/Extender: [name], [name], [name]: 9 Vital Signs Time Taken: 07:45 Temperature (F): 97.5 [name] ([id number]) [id number] Page 2 of 2 Height (in): 71 Pulse (bpm): 75 Weight (lbs): 252 Respiratory Rate (breaths/min): 18 Body Mass Index (BMI): 35.1 [name]: 118/70 Capillary [name]: 174 Reference Range: 80 - 120 mg / dl Electronic Signature(s) Signed: 02/26/2023 12:30:16 PM By: [name] EMT Entered By: [name] on 02/26/2023 12:30:16

## 2023-02-26 NOTE — Progress Notes (Addendum)
ELEX, MONTAGUE ([id number] Page 1 of 2 Visit Report for 02/26/2023 HBO Details Patient Name: Date of Service: [name]. 02/26/2023 8:00 Bill M Medical Record Number: [id number] Patient Account Number: [id number] Date of Birth/Sex: Treating RN: 02/04/1946 (77 y.o. [name] Primary Care [name]: [name] Other Clinician: Valeria Batman Referring [name]: Treating [name]/Extender: [name], Jessica [name] Weeks in Treatment: 9 HBO Treatment Course Details Treatment Course Number: 1 Ordering Shereka Lafortune: [name] T Treatments Ordered: otal 40 HBO Treatment Start Date: 01/05/2023 HBO Indication: Late Effect of Radiation HBO Treatment Details Treatment Number: 35 Patient Type: Outpatient Chamber Type: Monoplace Chamber Serial #: [id number] Treatment Protocol: 2.5 ATA with 90 minutes oxygen, with two 5 minute air breaks Treatment Details Compression Rate Down: 2.0 psi / minute De-Compression Rate Up: 2.0 psi / minute Bill breaks and breathing ir Compress Tx Pressure periods Decompress Decompress Begins Reached (leave unused spaces Begins Ends blank) Chamber Pressure (ATA 1 2.5 2.5 2.5 2.5 2.5 - - 2.5 1 ) Clock Time (24 hr) 08:18 08:33 09:03 09:08 09:38 09:43 - - 10:13 10:24 Treatment Length: 126 (minutes) Treatment Segments: 4 Vital Signs Capillary [name] Reference Range: 80 - 120 mg / dl HBO Diabetic [name] Intervention Range: <131 mg/dl or >249 mg/dl Time Vitals [name] Respiratory Capillary [name] Pulse Action Type: Pulse: Temperature: Taken: Pressure: Rate: Glucose (mg/dl): Meter #: Oximetry (%) Taken: Pre 07:45 118/70 75 18 97.5 174 Post 10:26 170/87 87 18 98.6 243 Treatment Response Treatment Toleration: Well Treatment Completion Status: Treatment Completed without Adverse Event Physician HBO Attestation: I certify that I supervised this HBO treatment in accordance with  Medicare guidelines. Bill trained emergency response team is readily available per Yes hospital policies and procedures. Continue HBOT as ordered. Yes Electronic Signature(s) Signed: 02/27/2023 1:36:39 PM By: [name] DO Previous Signature: 02/26/2023 12:33:24 PM Version By: Valeria Batman EMT Entered By: [name] on 02/27/2023 13:32:12 -------------------------------------------------------------------------------- HBO Safety Checklist Details Patient Name: Date of Service: [name], [name] 02/26/2023 8:00 Bill M Medical Record Number: [id number] Patient Account Number: [id number] Date of Birth/Sex: Treating RN: 01/19/1946 (77 y.o. [name] Primary Care Roper Tolson: [name] Other Clinician: Valeria Batman Referring [name]: Treating [name]/Extender: [name], Jessica [name] Weeks in Treatment: [place] Items Safety Checklist [name], [name] ([id number]) [id number] Page 2 of 2 Consent Form Signed Patient voided / foley secured and emptied When did you last eato 0630 Last dose of injectable or oral agent Yesterday Ostomy pouch emptied and vented if applicable NA All implantable devices assessed, documented and approved NA Intravenous access site secured and place NA Valuables secured Linens and cotton and cotton/polyester blend (less than 51% polyester) Personal oil-based products / skin lotions / body lotions removed Wigs or hairpieces removed NA Smoking or tobacco materials removed Books / newspapers / magazines / loose paper removed Cologne, aftershave, perfume and deodorant removed Jewelry removed (may wrap wedding band) Make-up removed NA Hair care products removed Battery operated devices (external) removed Heating patches and chemical warmers removed Titanium eyewear removed NA Nail polish cured greater than 10 hours NA Casting material cured greater than 10 hours NA Hearing aids  removed NA Loose dentures or partials removed removed by patient Prosthetics have been removed NA Patient demonstrates correct use of air break device (if applicable) Patient concerns have been addressed Patient grounding bracelet on and cord attached to chamber Specifics for Inpatients (complete in addition to above) Medication sheet sent  with patient NA Intravenous medications needed or due during therapy sent with patient NA Drainage tubes (e.g. nasogastric tube or chest tube secured and vented) NA Endotracheal or Tracheotomy tube secured NA Cuff deflated of air and inflated with saline NA Airway suctioned NA Notes The safety checklist was done before the treatment was started. Electronic Signature(s) Signed: 02/26/2023 12:31:53 PM By: Valeria Batman EMT Entered By: Valeria Batman on 02/26/2023 12:31:53

## 2023-02-27 ENCOUNTER — Encounter (HOSPITAL_BASED_OUTPATIENT_CLINIC_OR_DEPARTMENT_OTHER): Payer: Medicare Other | Admitting: Internal Medicine

## 2023-02-27 DIAGNOSIS — I129 Hypertensive chronic kidney disease with stage 1 through stage 4 chronic kidney disease, or unspecified chronic kidney disease: Secondary | ICD-10-CM | POA: Diagnosis not present

## 2023-02-27 DIAGNOSIS — N3041 Irradiation cystitis with hematuria: Secondary | ICD-10-CM

## 2023-02-27 DIAGNOSIS — E11621 Type 2 diabetes mellitus with foot ulcer: Secondary | ICD-10-CM

## 2023-02-27 DIAGNOSIS — N189 Chronic kidney disease, unspecified: Secondary | ICD-10-CM | POA: Diagnosis not present

## 2023-02-27 DIAGNOSIS — C61 Malignant neoplasm of prostate: Secondary | ICD-10-CM | POA: Diagnosis not present

## 2023-02-27 DIAGNOSIS — E1121 Type 2 diabetes mellitus with diabetic nephropathy: Secondary | ICD-10-CM | POA: Diagnosis not present

## 2023-02-27 DIAGNOSIS — E1122 Type 2 diabetes mellitus with diabetic chronic kidney disease: Secondary | ICD-10-CM | POA: Diagnosis not present

## 2023-02-27 DIAGNOSIS — L97512 Non-pressure chronic ulcer of other part of right foot with fat layer exposed: Secondary | ICD-10-CM | POA: Diagnosis not present

## 2023-02-27 LAB — GLUCOSE, CAPILLARY
Glucose-Capillary: 256 mg/dL — ABNORMAL HIGH (ref 70–99)
Glucose-Capillary: 296 mg/dL — ABNORMAL HIGH (ref 70–99)

## 2023-02-27 NOTE — Progress Notes (Signed)
MEIKHI, FLORO ([id number]) [id number] Page 1 of 1 Visit Report for 02/25/2023 [name] Details Patient Name: Date of Service: [name] 02/25/2023 Medical Record Number: [id number] Patient Account Number: [id number] Date of Birth/Sex: Treating RN: Dec 13, 1946 (77 y.o. 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 C61 Malignant neoplasm of prostate E11.621 Type 2 diabetes mellitus with foot ulcer L97.512 Non-pressure chronic ulcer of other part of right foot with fat layer exposed Facility Procedures CPT4 Code Description Modifier Quantity [id number] G0277-(Facility Use Only) HBOT full body chamber, 42min , 4 ICD-10 Diagnosis Description N30.41 Irradiation cystitis with hematuria C61 Malignant neoplasm of prostate E11.621 Type 2 diabetes mellitus with foot ulcer Physician Procedures Quantity CPT4 Code Description Modifier [id number] - WC PHYS HYPERBARIC OXYGEN THERAPY 1 ICD-10 Diagnosis Description N30.41 Irradiation cystitis with hematuria C61 Malignant neoplasm of prostate E11.621 Type 2 diabetes mellitus with foot ulcer Electronic Signature(s) Signed: 02/25/2023 1:14:42 PM By: [name] , , Signed: 02/26/2023 3:34:03 PM By: [name] DO Entered By: [name] on 02/25/2023 13:14:42

## 2023-02-27 NOTE — Progress Notes (Signed)
BONNER, JANI ([id number]) [id number] Page 1 of 2 Visit Report for 02/27/2023 Arrival Information Details Patient Name: Date of Service: [name] 02/27/2023 8:00 A M Medical Record Number: [id number] Patient Account Number: [id number] Date of Birth/Sex: Treating RN: 1946/10/11 (77 y.o. [name] Primary Care Yuritzi Kamp: [name], [name] Other Clinician: Valeria Batman Referring Zen Cedillos: Treating Bannon Giammarco/Extender: [name], [name], [name] in Treatment: 9 Visit Information History Since Last Visit All ordered tests and consults were completed: Yes Patient Arrived: [name] Added or deleted any medications: No Arrival Time: 07:33 Any new allergies or adverse reactions: No Accompanied By: None Had a fall or experienced change in No Transfer Assistance: None activities of daily living that may affect Patient Identification Verified: Yes risk of falls: Secondary Verification Process Completed: Yes Signs or symptoms of abuse/neglect since last visito No Patient Requires Transmission-Based Precautions: No Hospitalized since last visit: No Patient Has Alerts: Yes Implantable device outside of the clinic excluding No Patient Alerts: Patient on Blood Thinner cellular tissue based products placed in the center since last visit: Pain Present Now: No Electronic Signature(s) Signed: 02/27/2023 10:55:41 AM By: Valeria Batman EMT Entered By: Valeria Batman on 02/27/2023 10:55:41 -------------------------------------------------------------------------------- Encounter Discharge Information Details Patient Name: Date of Service: [name], [name] 02/27/2023 8:00 A M Medical Record Number: [id number] Patient Account Number: [id number] Date of Birth/Sex: Treating RN: 1946/11/06 (77 y.o. [name] Primary Care Ramelo Oetken: [name], [name] Other Clinician: Valeria Batman Referring Khoen Genet: Treating Thiago Ragsdale/Extender: [name],  [name], [name] in Treatment: 9 Encounter Discharge Information Items Discharge Condition: Stable Ambulatory Status: Walker Discharge Destination: Home Transportation: Private Auto Accompanied By: None Schedule Follow-up Appointment: Yes Clinical Summary of Care: Electronic Signature(s) Signed: 02/27/2023 11:46:37 AM By: Valeria Batman EMT Entered By: Valeria Batman on 02/27/2023 11:46:37 -------------------------------------------------------------------------------- Vitals Details Patient Name: Date of Service: Lambert, [name] 02/27/2023 8:00 A M Medical Record Number: [id number] Patient Account Number: [id number] Date of Birth/Sex: Treating RN: 09-May-1946 (77 y.o. [name] Primary Care Darrielle Pflieger: [name], [name] Other Clinician: Valeria Batman Referring Earsel Shouse: Treating Angely Dietz/Extender: [name], [name], [name] in Treatment: 9 Vital Signs Time Taken: 07:43 Temperature (F): 127/62 EMER, KAN ([id number]) [id number] Page 2 of 2 Height (in): 71 Pulse (bpm): 79 Weight (lbs): 252 Respiratory Rate (breaths/min): 18 Body Mass Index (BMI): 35.1 Blood Pressure (mmHg): 127/62 Capillary Blood Glucose (mg/dl): 256 Reference Range: 80 - 120 mg / dl Electronic Signature(s) Signed: 02/27/2023 10:56:17 AM By: Valeria Batman EMT Entered By: Valeria Batman on 02/27/2023 10:56:16

## 2023-02-27 NOTE — Progress Notes (Addendum)
[name], [name] ([id number].pdf Page 1 of 2 Visit Report for 02/27/2023 HBO Details Patient Name: Date of Service: [name] 02/27/2023 8:00 A M Medical Record Number: [id number] Patient Account Number: [id number] Date of Birth/Sex: Treating RN: 1946/03/05 (77 y.o. [name] Primary Care Sully Manzi: [name], [name] Other Clinician: Valeria Batman Referring Shavell Nored: Treating Rikayla Demmon/Extender: [name], [name], [name] in Treatment: 9 HBO Treatment Course Details Treatment Course Number: 1 Ordering Christepher Melchior: [name] T Treatments Ordered: otal 40 HBO Treatment Start Date: 01/05/2023 HBO Indication: Late Effect of Radiation HBO Treatment Details Treatment Number: 36 Patient Type: Outpatient Chamber Type: Monoplace Chamber Serial #: [id number] Treatment Protocol: 2.5 ATA with 90 minutes oxygen, with two 5 minute air breaks Treatment Details Compression Rate Down: 2.0 psi / minute De-Compression Rate Up: 2.0 psi / minute A breaks and breathing ir Compress Tx Pressure periods Decompress Decompress Begins Reached (leave unused spaces Begins Ends blank) Chamber Pressure (ATA 1 2.5 2.5 2.5 2.5 2.5 - - 2.5 1 ) Clock Time (24 hr) 08:29 08:39 09:09 09:14 09:44 09:49 - - 10:19 10:29 Treatment Length: 120 (minutes) Treatment Segments: 4 Vital Signs Capillary Blood Glucose Reference Range: 80 - 120 mg / dl HBO Diabetic Blood Glucose Intervention Range: <131 mg/dl or >249 mg/dl Time Vitals Blood Respiratory Capillary Blood Glucose Pulse Action Type: Pulse: Temperature: Taken: Pressure: Rate: Glucose (mg/dl): Meter #: Oximetry (%) Taken: Pre 07:43 127/62 79 18 97.9 256 Post 10:37 146/88 78 18 98 296 Treatment Response Treatment Toleration: Well Treatment Completion Status: Treatment Completed without Adverse Event Physician HBO Attestation: I certify that I supervised this HBO treatment in accordance with  Medicare guidelines. A trained emergency response team is readily available per Yes hospital policies and procedures. Continue HBOT as ordered. Yes Electronic Signature(s) Signed: 03/02/2023 12:02:27 PM By: Valeria Batman EMT Signed: 03/02/2023 12:10:44 PM By: [name] DO Previous Signature: 03/02/2023 11:58:00 AM Version By: Valeria Batman EMT Previous Signature: 02/27/2023 1:36:39 PM Version By: [name] DO Previous Signature: 02/27/2023 10:59:11 AM Version By: Valeria Batman EMT Entered By: Valeria Batman on 03/02/2023 12:02:27 -------------------------------------------------------------------------------- HBO Safety Checklist Details Patient Name: Date of Service: [facility], [name] 02/27/2023 8:00 A M Medical Record Number: [id number] Patient Account Number: [id number] Date of Birth/Sex: Treating RN: 1946-05-25 (76 y.o. [name] Primary Care Preciliano Castell: [name], [name] Other Clinician: Valeria Batman Referring Shivank Pinedo: Treating Jaegar Croft/Extender: [name], [name], [name] in Treatment: [address] [place] ([id number]) [id number] Page 2 of 2 HBO Safety Checklist Items Safety Checklist Consent Form Signed Patient voided / foley secured and emptied When did you last eato 0630 Last dose of injectable or oral agent 0640 Ostomy pouch emptied and vented if applicable NA All implantable devices assessed, documented and approved NA Intravenous access site secured and place NA Valuables secured Linens and cotton and cotton/polyester blend (less than 51% polyester) Personal oil-based products / skin lotions / body lotions removed Wigs or hairpieces removed NA Smoking or tobacco materials removed Books / newspapers / magazines / loose paper removed Cologne, aftershave, perfume and deodorant removed Jewelry removed (may wrap wedding band) Make-up removed NA Hair care products removed Battery operated devices (external)  removed Heating patches and chemical warmers removed Titanium eyewear removed NA Nail polish cured greater than 10 hours NA Casting material cured greater than 10 hours NA Hearing aids removed NA Loose dentures or partials removed removed by patient Prosthetics have been removed NA Patient demonstrates correct use of air break  device (if applicable) Patient concerns have been addressed Patient grounding bracelet on and cord attached to chamber Specifics for Inpatients (complete in addition to above) Medication sheet sent with patient NA Intravenous medications needed or due during therapy sent with patient NA Drainage tubes (e.g. nasogastric tube or chest tube secured and vented) NA Endotracheal or Tracheotomy tube secured NA Cuff deflated of air and inflated with saline NA Airway suctioned NA Notes The safety checklist was done before the treatment was started. Electronic Signature(s) Signed: 02/27/2023 10:57:24 AM By: Valeria Batman EMT Entered By: Valeria Batman on 02/27/2023 10:57:24

## 2023-02-27 NOTE — Progress Notes (Signed)
HAKI, PASSLEY ([id number]) [id number] Page 1 of 2 Visit Report for 02/27/2023 Problem List Details Patient Name: Date of Service: [name]. 02/27/2023 8:00 A M Medical Record Number: [id number] Patient Account Number: [id number] Date of Birth/Sex: Treating RN: November 11, 1946 (77 y.o. 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 02/27/2023 Medical Record Number: [id number] Patient Account Number: [id number] Date of Birth/Sex: Treating RN: 1946-09-11 (77 y.o. 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  2 diabetes mellitus with diabetic nephropathy C61 Malignant neoplasm of prostate E11.621 Type 2 diabetes mellitus with foot ulcer L97.512 Non-pressure chronic ulcer of other part of right foot with fat layer exposed Facility Procedures : CPT4 Code: [id number] Description: G0277-(Facility Use Only) HBOT full body chamber, 45min , ICD-10 Diagnosis Description N30.41 Irradiation cystitis with hematuria C61 Malignant neoplasm of prostate E11.621 Type 2 diabetes mellitus with foot ulcer Modifier: Quantity: 4 Physician Procedures : CPT4 Code Description Modifier [id number] - WC PHYS HYPERBARIC OXYGEN THERAPY ICD-10 Diagnosis Description N30.41 Irradiation cystitis with hematuria C61 Malignant neoplasm of prostate E11.621 Type 2 diabetes mellitus with foot ulcer Quantity: 1 Electronic Signature(s) Signed: 02/27/2023 11:45:54 AM By: Valeria Batman EMT Signed: 02/27/2023 1:36:39 PM By: [name] DO Entered By: Valeria Batman on 02/27/2023 11:45:54

## 2023-02-27 NOTE — Progress Notes (Signed)
DIAZ, MATUSIK ([id number]) [id number] Page 1 of 2 Visit Report for 02/26/2023 Problem List Details Patient Name: Date of Service: [name]. 02/26/2023 8:00 A M Medical Record Number: [id number] Patient Account Number: [id number] Date of Birth/Sex: Treating RN: Jan 09, 1946 (77 y.o. 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 02/26/2023 Medical Record Number: [id number] Patient Account Number: [id number] Date of Birth/Sex: Treating RN: April 05, 1946 (77 y.o. [name] Primary Care Provider: [name], [name] Other Clinician: Valeria Batman Referring Provider: Treating Provider/Extender: Tammera Engert [name], [name] in Treatment: 9 Diagnosis Coding ICD-10 Codes Code Description N30.41 Irradiation cystitis with hematuria I10 Essential (primary) hypertension Fodge, Joeph L ([id number]) [id number] Page 2 of 2 E11.21  Type 2 diabetes mellitus with diabetic nephropathy C61 Malignant neoplasm of prostate E11.621 Type 2 diabetes mellitus with foot ulcer L97.512 Non-pressure chronic ulcer of other part of right foot with fat layer exposed Facility Procedures : CPT4 Code: [id number] Description: G0277-(Facility Use Only) HBOT full body chamber, 57min , ICD-10 Diagnosis Description N30.41 Irradiation cystitis with hematuria C61 Malignant neoplasm of prostate E11.621 Type 2 diabetes mellitus with foot ulcer Modifier: Quantity: 4 Physician Procedures : CPT4 Code Description Modifier [id number] - WC PHYS HYPERBARIC OXYGEN THERAPY ICD-10 Diagnosis Description N30.41 Irradiation cystitis with hematuria C61 Malignant neoplasm of prostate E11.621 Type 2 diabetes mellitus with foot ulcer Quantity: 1 Electronic Signature(s) Signed: 02/26/2023 12:33:50 PM By: Valeria Batman EMT Signed: 02/27/2023 1:36:39 PM By: [name] DO Entered By: Valeria Batman on 02/26/2023 12:33:49

## 2023-03-02 ENCOUNTER — Encounter (HOSPITAL_BASED_OUTPATIENT_CLINIC_OR_DEPARTMENT_OTHER): Payer: Medicare Other | Admitting: Internal Medicine

## 2023-03-02 DIAGNOSIS — E11621 Type 2 diabetes mellitus with foot ulcer: Secondary | ICD-10-CM | POA: Diagnosis not present

## 2023-03-02 DIAGNOSIS — N189 Chronic kidney disease, unspecified: Secondary | ICD-10-CM | POA: Diagnosis not present

## 2023-03-02 DIAGNOSIS — L97512 Non-pressure chronic ulcer of other part of right foot with fat layer exposed: Secondary | ICD-10-CM | POA: Diagnosis not present

## 2023-03-02 DIAGNOSIS — E1121 Type 2 diabetes mellitus with diabetic nephropathy: Secondary | ICD-10-CM | POA: Diagnosis not present

## 2023-03-02 DIAGNOSIS — E1122 Type 2 diabetes mellitus with diabetic chronic kidney disease: Secondary | ICD-10-CM | POA: Diagnosis not present

## 2023-03-02 DIAGNOSIS — N3041 Irradiation cystitis with hematuria: Secondary | ICD-10-CM

## 2023-03-02 DIAGNOSIS — I129 Hypertensive chronic kidney disease with stage 1 through stage 4 chronic kidney disease, or unspecified chronic kidney disease: Secondary | ICD-10-CM | POA: Diagnosis not present

## 2023-03-02 DIAGNOSIS — C61 Malignant neoplasm of prostate: Secondary | ICD-10-CM

## 2023-03-02 LAB — GLUCOSE, CAPILLARY
Glucose-Capillary: 176 mg/dL — ABNORMAL HIGH (ref 70–99)
Glucose-Capillary: 158 mg/dL — ABNORMAL HIGH (ref 70–99)

## 2023-03-02 NOTE — Progress Notes (Signed)
Bill, [name] ([id number]) [id number] Page 1 of 2 Visit Report for 03/02/2023 Arrival Information Details Patient Name: Date of Service: [name] 03/02/2023 8:00 A M Medical Record Number: [id number] Patient Account Number: [id number] Date of Birth/Sex: Treating RN: [birth year] (77 y.o. [name] Primary Care Levi Klaiber: [name], [name] Other Clinician: Valeria Batman Referring Maya Arcand: Treating Veleria Barnhardt/Extender: [name], [name], [name] in Treatment: 10 Visit Information History Since Last Visit All ordered tests and consults were completed: Yes Patient Arrived: [name] Added or deleted any medications: No Arrival Time: 08:05 Any new allergies or adverse reactions: No Accompanied By: None Had a fall or experienced change in No Transfer Assistance: None activities of daily living that may affect Patient Identification Verified: Yes risk of falls: Secondary Verification Process Completed: Yes Signs or symptoms of abuse/neglect since last visito No Patient Requires Transmission-Based Precautions: No Hospitalized since last visit: No Patient Has Alerts: Yes Implantable device outside of the clinic excluding No Patient Alerts: Patient on Blood Thinner cellular tissue based products placed in the center since last visit: Pain Present Now: No Electronic Signature(s) Signed: 03/02/2023 11:54:18 AM By: Valeria Batman EMT Entered By: Valeria Batman on 03/02/2023 11:54:18 -------------------------------------------------------------------------------- Encounter Discharge Information Details Patient Name: Date of Service: [name], [name] 03/02/2023 8:00 A M Medical Record Number: [id number] Patient Account Number: [id number] Date of Birth/Sex: Treating RN: 1946-02-13 (77 y.o. [name] Primary Care Satara Virella: [name], [name] Other Clinician: Valeria Batman Referring Ashlie Mcmenamy: Treating Ardie Mclennan/Extender: [name],  [name], [name] in Treatment: 10 Encounter Discharge Information Items Discharge Condition: Stable Ambulatory Status: Walker Discharge Destination: Home Transportation: Private Auto Accompanied By: None Schedule Follow-up Appointment: Yes Clinical Summary of Care: Electronic Signature(s) Signed: 03/02/2023 12:06:56 PM By: Valeria Batman EMT Entered By: Valeria Batman on 03/02/2023 12:06:56 -------------------------------------------------------------------------------- Vitals Details Patient Name: Date of Service: [name], [name] 03/02/2023 8:00 A M Medical Record Number: [id number] Patient Account Number: [id number] Date of Birth/Sex: Treating RN: 1946-08-22 (77 y.o. [name] Primary Care Gaylin Bulthuis: [name], [name] Other Clinician: Valeria Batman Referring Riyana Biel: Treating Roslynn Holte/Extender: [name], [name], [name] in Treatment: 10 Vital Signs Time Taken: 08:02 Temperature (F): 97.7 Pittmon, Khaleed L ([id number]) [id number].pdf Page 2 of 2 Height (in): 71 Pulse (bpm): 79 Weight (lbs): 252 Respiratory Rate (breaths/min): 18 Body Mass Index (BMI): 35.1 Blood Pressure (mmHg): 144/73 Capillary Blood Glucose (mg/dl): 176 Reference Range: 80 - 120 mg / dl Electronic Signature(s) Signed: 03/02/2023 12:01:06 PM By: Valeria Batman EMT Entered By: Valeria Batman on 03/02/2023 12:01:06

## 2023-03-02 NOTE — Progress Notes (Signed)
COBIN, HIRTZ ([id number]) [id number].pdf Page 1 of 2 Visit Report for 03/02/2023 HBO Details Patient Name: Date of Service: [name]. 03/02/2023 8:00 A M Medical Record Number: [id number] Patient Account Number: [id number] Date of Birth/Sex: Treating RN: Dec 14, 1946 (78 y.o. [name], [name] Primary Care [name]: [name] Other Clinician: Valeria Batman Referring [name]: Treating [name]/Extender: [name], [name], [name]: 10 HBO Treatment Course Details Treatment Course Number: 1 Ordering Edilson Vital: [name] T Treatments Ordered: otal 40 HBO Treatment Start Date: 01/05/2023 HBO Indication: Late Effect of Radiation HBO Treatment Details Treatment Number: 37 Patient Type: Outpatient Chamber Type: Monoplace Chamber Serial #: [id number] Treatment Protocol: 2.5 ATA with 90 minutes oxygen, with two 5 minute air breaks Treatment Details Compression Rate Down: 2.0 psi / minute De-Compression Rate Up: A breaks and breathing ir Compress Tx Pressure periods Decompress Decompress Begins Reached (leave unused spaces Begins Ends blank) Chamber Pressure (ATA 1 2.5 2.5 2.5 2.5 2.5 - - 2.5 1 ) Clock Time (24 hr) 08:21 08:34 09:04 09:09 09:39 09:44 - - 10:14 10:23 Treatment Length: 122 (minutes) Treatment Segments: 4 Vital Signs Capillary [name] Reference Range: 80 - 120 mg / dl HBO Diabetic [name] Intervention Range: <131 mg/dl or >249 mg/dl Time Vitals [name] Respiratory Capillary [name] Pulse Action Type: Pulse: Temperature: Taken: Pressure: Rate: Glucose (mg/dl): Meter #: Oximetry (%) Taken: Pre 08:02 144/73 79 18 97.7 176 Post 10:29 145/79 77 18 97.7 158 Treatment Response Treatment Toleration: Well Treatment Completion Status: Treatment Completed without Adverse Event Physician HBO Attestation: I certify that I supervised this HBO treatment in accordance with Medicare guidelines. A  trained emergency response team is readily available per Yes hospital policies and procedures. Continue HBOT as ordered. Yes Electronic Signature(s) Signed: 03/02/2023 4:26:30 PM By: [name] DO Previous Signature: 03/02/2023 12:05:39 PM Version By: Valeria Batman EMT Previous Signature: 03/02/2023 12:10:44 PM Version By: [name] DO Entered By: [name] on 03/02/2023 16:24:44 -------------------------------------------------------------------------------- HBO Safety Checklist Details Patient Name: Date of Service: Bill Paganini, [name] 03/02/2023 8:00 A M Medical Record Number: [id number] Patient Account Number: [id number] Date of Birth/Sex: Treating RN: 01-17-1946 (77 y.o. [name] Primary Care [name]: [name] Other Clinician: Valeria Batman Referring Ellenora Talton: Treating Dawood Spitler/Extender: [name], [name], [name]: [place] [employer] VENARD, LESCARBEAU ([id number]) [id number] Page 2 of 2 Safety Checklist Consent Form Signed Patient voided / foley secured and emptied When did you last eato 0630 Last dose of injectable or oral agent 0640 Ostomy pouch emptied and vented if applicable NA All implantable devices assessed, documented and approved NA Intravenous access site secured and place NA Valuables secured Linens and cotton and cotton/polyester blend (less than 51% polyester) Personal oil-based products / skin lotions / body lotions removed Wigs or hairpieces removed NA Smoking or tobacco materials removed Books / newspapers / magazines / loose paper removed Cologne, aftershave, perfume and deodorant removed Jewelry removed (may wrap wedding band) Make-up removed NA Hair care products removed Battery operated devices (external) removed Heating patches and chemical warmers removed Titanium eyewear removed NA Nail polish cured greater than 10 hours NA Casting material cured  greater than 10 hours NA Hearing aids removed NA Loose dentures or partials removed removed by patient Prosthetics have been removed NA Patient demonstrates correct use of air break device (if applicable) Patient concerns have been addressed Patient grounding bracelet on and cord attached to chamber Specifics for Inpatients (complete in  addition to above) Medication sheet sent with patient NA Intravenous medications needed or due during therapy sent with patient NA Drainage tubes (e.g. nasogastric tube or chest tube secured and vented) NA Endotracheal or Tracheotomy tube secured NA Cuff deflated of air and inflated with saline NA Airway suctioned NA Notes The safety checklist was done before the treatment was started. Electronic Signature(s) Signed: 03/02/2023 12:02:13 PM By: Valeria Batman EMT Entered By: Valeria Batman on 03/02/2023 12:02:12

## 2023-03-03 ENCOUNTER — Encounter (HOSPITAL_BASED_OUTPATIENT_CLINIC_OR_DEPARTMENT_OTHER): Payer: Medicare Other | Admitting: Internal Medicine

## 2023-03-03 DIAGNOSIS — L97512 Non-pressure chronic ulcer of other part of right foot with fat layer exposed: Secondary | ICD-10-CM | POA: Diagnosis not present

## 2023-03-03 DIAGNOSIS — N189 Chronic kidney disease, unspecified: Secondary | ICD-10-CM | POA: Diagnosis not present

## 2023-03-03 DIAGNOSIS — N3041 Irradiation cystitis with hematuria: Secondary | ICD-10-CM | POA: Diagnosis not present

## 2023-03-03 DIAGNOSIS — I129 Hypertensive chronic kidney disease with stage 1 through stage 4 chronic kidney disease, or unspecified chronic kidney disease: Secondary | ICD-10-CM | POA: Diagnosis not present

## 2023-03-03 DIAGNOSIS — E11621 Type 2 diabetes mellitus with foot ulcer: Secondary | ICD-10-CM | POA: Diagnosis not present

## 2023-03-03 DIAGNOSIS — C61 Malignant neoplasm of prostate: Secondary | ICD-10-CM | POA: Diagnosis not present

## 2023-03-03 DIAGNOSIS — E1121 Type 2 diabetes mellitus with diabetic nephropathy: Secondary | ICD-10-CM | POA: Diagnosis not present

## 2023-03-03 DIAGNOSIS — E1122 Type 2 diabetes mellitus with diabetic chronic kidney disease: Secondary | ICD-10-CM | POA: Diagnosis not present

## 2023-03-03 LAB — GLUCOSE, CAPILLARY
Glucose-Capillary: 192 mg/dL — ABNORMAL HIGH (ref 70–99)
Glucose-Capillary: 194 mg/dL — ABNORMAL HIGH (ref 70–99)

## 2023-03-04 ENCOUNTER — Encounter (HOSPITAL_BASED_OUTPATIENT_CLINIC_OR_DEPARTMENT_OTHER): Payer: Medicare Other | Admitting: Physician Assistant

## 2023-03-04 DIAGNOSIS — E1121 Type 2 diabetes mellitus with diabetic nephropathy: Secondary | ICD-10-CM | POA: Diagnosis not present

## 2023-03-04 DIAGNOSIS — L598 Other specified disorders of the skin and subcutaneous tissue related to radiation: Secondary | ICD-10-CM | POA: Diagnosis not present

## 2023-03-04 DIAGNOSIS — N189 Chronic kidney disease, unspecified: Secondary | ICD-10-CM | POA: Diagnosis not present

## 2023-03-04 DIAGNOSIS — E1122 Type 2 diabetes mellitus with diabetic chronic kidney disease: Secondary | ICD-10-CM | POA: Diagnosis not present

## 2023-03-04 DIAGNOSIS — I129 Hypertensive chronic kidney disease with stage 1 through stage 4 chronic kidney disease, or unspecified chronic kidney disease: Secondary | ICD-10-CM | POA: Diagnosis not present

## 2023-03-04 DIAGNOSIS — L97512 Non-pressure chronic ulcer of other part of right foot with fat layer exposed: Secondary | ICD-10-CM | POA: Diagnosis not present

## 2023-03-04 DIAGNOSIS — E11621 Type 2 diabetes mellitus with foot ulcer: Secondary | ICD-10-CM | POA: Diagnosis not present

## 2023-03-04 LAB — GLUCOSE, CAPILLARY
Glucose-Capillary: 213 mg/dL — ABNORMAL HIGH (ref 70–99)
Glucose-Capillary: 239 mg/dL — ABNORMAL HIGH (ref 70–99)

## 2023-03-04 NOTE — Progress Notes (Signed)
Bill, [name] ([id number].pdf Page 1 of 2 Visit Report for 03/03/2023 Arrival Information Details Patient Name: Date of Service: [name] 03/03/2023 8:00 A M Medical Record Number: [id number] Patient Account Number: [id number] Date of Birth/Sex: Treating RN: Mar 06, 1946 (77 y.o. [name] Primary Care Lajoy Vanamburg: [name], [name] Other Clinician: Valeria Batman Referring Niomi Valent: Treating Hazelynn Mckenny/Extender: [name], [name], [name] in Treatment: 10 Visit Information History Since Last Visit All ordered tests and consults were completed: Yes Patient Arrived: [name] Added or deleted any medications: No Arrival Time: 07:33 Any new allergies or adverse reactions: No Accompanied By: None Had a fall or experienced change in No Transfer Assistance: None activities of daily living that may affect Patient Identification Verified: Yes risk of falls: Secondary Verification Process Completed: Yes Signs or symptoms of abuse/neglect since last visito No Patient Requires Transmission-Based Precautions: No Hospitalized since last visit: No Patient Has Alerts: Yes Implantable device outside of the clinic excluding No Patient Alerts: Patient on [name] cellular tissue based products placed in the center since last visit: Pain Present Now: No Electronic Signature(s) Signed: 03/03/2023 2:39:01 PM By: Valeria Batman EMT Entered By: Valeria Batman on 03/03/2023 14:39:00 -------------------------------------------------------------------------------- Encounter Discharge Information Details Patient Name: Date of Service: [name], [name] 03/03/2023 8:00 A M Medical Record Number: [id number] Patient Account Number: [id number] Date of Birth/Sex: Treating RN: 1946-04-12 (77 y.o. [name] Primary Care Krishna Heuer: [name], [name] Other Clinician: Valeria Batman Referring Oley Lahaie: Treating Malynda Smolinski/Extender: [name],  [name], [name] in Treatment: 10 Encounter Discharge Information Items Discharge Condition: Stable Ambulatory Status: Walker Discharge Destination: Home Transportation: Private Auto Accompanied By: None Schedule Follow-up Appointment: Yes Clinical Summary of Care: Electronic Signature(s) Signed: 03/03/2023 2:50:46 PM By: Valeria Batman EMT Entered By: Valeria Batman on 03/03/2023 14:50:46 -------------------------------------------------------------------------------- Vitals Details Patient Name: Date of Service: [name], [name] 03/03/2023 8:00 A M Medical Record Number: [id number] Patient Account Number: [id number] Date of Birth/Sex: Treating RN: Mar 05, 1946 (77 y.o. [name] Primary Care Cristen Bredeson: [name], [name] Other Clinician: Valeria Batman Referring Hiep Ollis: Treating Tuvia Woodrick/Extender: [name], [name], [name] in Treatment: 10 Vital Signs Time Taken: 08:04 Temperature (F): 97.9 EDHER, GING L ([id number]) [id number] Page 2 of 2 Height (in): 71 Pulse (bpm): 80 Weight (lbs): 252 Respiratory Rate (breaths/min): 16 Body Mass Index (BMI): 35.1 [name]: 138/68 Capillary [name]: 192 Reference Range: 80 - 120 mg / dl Electronic Signature(s) Signed: 03/03/2023 2:43:03 PM By: Valeria Batman EMT Entered By: Valeria Batman on 03/03/2023 14:43:03

## 2023-03-04 NOTE — Progress Notes (Signed)
[name], [name] ([id number].pdf Page 1 of 2 Visit Report for 03/03/2023 Problem List Details Patient Name: Date of Service: [name]. 03/03/2023 8:00 Bill M Medical Record Number: [id number] Patient Account Number: [id number] Date of Birth/Sex: Treating RN: 1946-01-06 (77 y.o. 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 03/03/2023 Medical Record Number: [id number] Patient Account Number: [id number] Date of Birth/Sex: Treating RN: 1946-06-29 (77 y.o. [name] Primary Care Provider: [name], [name] Other Clinician: Valeria Batman Referring Provider: Treating Provider/Extender: Latangela Mccomas [name], [name] in Treatment: 10 Diagnosis Coding ICD-10 Codes Code Description N30.41 Irradiation cystitis with hematuria I10 Essential (primary) hypertension [name], [name] ([id number] Page 2 of 2 E11.21  Type 2 diabetes mellitus with diabetic nephropathy C61 Malignant neoplasm of prostate E11.621 Type 2 diabetes mellitus with foot ulcer L97.512 Non-pressure chronic ulcer of other part of right foot with fat layer exposed Facility Procedures : CPT4 Code: [id number] Description: G0277-(Facility Use Only) HBOT full body chamber, 89min , ICD-10 Diagnosis Description N30.41 Irradiation cystitis with hematuria C61 Malignant neoplasm of prostate E11.621 Type 2 diabetes mellitus with foot ulcer Modifier: Quantity: 4 Physician Procedures : CPT4 Code Description Modifier [id number] - WC PHYS HYPERBARIC OXYGEN THERAPY ICD-10 Diagnosis Description N30.41 Irradiation cystitis with hematuria C61 Malignant neoplasm of prostate E11.621 Type 2 diabetes mellitus with foot ulcer Quantity: 1 Electronic Signature(s) Signed: 03/03/2023 2:50:10 PM By: Valeria Batman EMT Signed: 03/03/2023 5:03:25 PM By: [name] DO Entered By: Valeria Batman on 03/03/2023 14:50:10

## 2023-03-04 NOTE — Progress Notes (Addendum)
[name], [name] ([id number].pdf Page 1 of 2 Visit Report for 03/03/2023 HBO Details Patient Name: Date of Service: [name] 03/03/2023 8:00 A M Medical Record Number: [id number] Patient Account Number: [id number] Date of Birth/Sex: Treating RN: 1946/07/10 (77 y.o. [name] Primary Care Lucah Petta: [name], [name] Other Clinician: Valeria Batman Referring Deneise Getty: Treating Mychael Smock/Extender: [name], [name], [name] in Treatment: 10 HBO Treatment Course Details Treatment Course Number: 1 Ordering Sunnie Odden: [name] T Treatments Ordered: otal 40 HBO Treatment Start Date: 01/05/2023 HBO Indication: Late Effect of Radiation HBO Treatment Details Treatment Number: 38 Patient Type: Outpatient Chamber Type: Monoplace Chamber Serial #: [id number] Treatment Protocol: 2.5 ATA with 90 minutes oxygen, with two 5 minute air breaks Treatment Details Compression Rate Down: 2.0 psi / minute De-Compression Rate Up: 2.0 psi / minute A breaks and breathing ir Compress Tx Pressure periods Decompress Decompress Begins Reached (leave unused spaces Begins Ends blank) Chamber Pressure (ATA 1 2.5 2.5 2.5 2.5 2.5 - - 2.5 1 ) Clock Time (24 hr) 08:38 08:49 09:19 09:24 09:54 09:59 - - 10:29 10:40 Treatment Length: 122 (minutes) Treatment Segments: 4 Vital Signs Capillary Blood Glucose Reference Range: 80 - 120 mg / dl HBO Diabetic Blood Glucose Intervention Range: <131 mg/dl or >249 mg/dl Time Vitals Blood Respiratory Capillary Blood Glucose Pulse Action Type: Pulse: Temperature: Taken: Pressure: Rate: Glucose (mg/dl): Meter #: Oximetry (%) Taken: Pre 08:04 138/68 80 16 97.9 192 Post 10:45 157/75 79 16 97.9 194 Treatment Response Treatment Toleration: Well Treatment Completion Status: Treatment Completed without Adverse Event Physician HBO Attestation: I certify that I supervised this HBO treatment in accordance with  Medicare guidelines. A trained emergency response team is readily available per Yes hospital policies and procedures. Continue HBOT as ordered. Yes Electronic Signature(s) Signed: 03/03/2023 5:03:25 PM By: [name] DO Previous Signature: 03/03/2023 2:49:46 PM Version By: Valeria Batman EMT Entered By: [name] on 03/03/2023 17:00:33 -------------------------------------------------------------------------------- HBO Safety Checklist Details Patient Name: Date of Service: [name], [name] 03/03/2023 8:00 A M Medical Record Number: [id number] Patient Account Number: [id number] Date of Birth/Sex: Treating RN: 06/18/1946 (77 y.o. [name] Primary Care Coron Rossano: [name], [name] Other Clinician: Valeria Batman Referring Rilyn Scroggs: Treating Jadzia Ibsen/Extender: [name], [name], [name] in Treatment: [place]an[place]illity Items Safety Checklist [name], [name] ([id number]) [id number] Page 2 of 2 Consent Form Signed Patient voided / foley secured and emptied When did you last eato 0630 Last dose of injectable or oral agent Yesterday Ostomy pouch emptied and vented if applicable NA All implantable devices assessed, documented and approved NA Intravenous access site secured and place NA Valuables secured Linens and cotton and cotton/polyester blend (less than 51% polyester) Personal oil-based products / skin lotions / body lotions removed Wigs or hairpieces removed NA Smoking or tobacco materials removed Books / newspapers / magazines / loose paper removed Cologne, aftershave, perfume and deodorant removed Jewelry removed (may wrap wedding band) Make-up removed NA Hair care products removed Battery operated devices (external) removed Heating patches and chemical warmers removed Titanium eyewear removed NA Nail polish cured greater than 10 hours NA Casting material cured greater than 10 hours NA Hearing aids  removed NA Loose dentures or partials removed removed by patient Prosthetics have been removed NA Patient demonstrates correct use of air break device (if applicable) Patient concerns have been addressed Patient grounding bracelet on and cord attached to chamber Specifics for Inpatients (complete in addition to above) Medication sheet sent  with patient NA Intravenous medications needed or due during therapy sent with patient NA Drainage tubes (e.g. nasogastric tube or chest tube secured and vented) NA Endotracheal or Tracheotomy tube secured NA Cuff deflated of air and inflated with saline NA Airway suctioned NA Notes The safety checklist was done before the treatment was started. Electronic Signature(s) Signed: 03/03/2023 2:48:16 PM By: Valeria Batman EMT Entered By: Valeria Batman on 03/03/2023 14:48:16

## 2023-03-05 ENCOUNTER — Encounter (HOSPITAL_BASED_OUTPATIENT_CLINIC_OR_DEPARTMENT_OTHER): Payer: Medicare Other | Admitting: Internal Medicine

## 2023-03-05 DIAGNOSIS — I129 Hypertensive chronic kidney disease with stage 1 through stage 4 chronic kidney disease, or unspecified chronic kidney disease: Secondary | ICD-10-CM | POA: Diagnosis not present

## 2023-03-05 DIAGNOSIS — N3041 Irradiation cystitis with hematuria: Secondary | ICD-10-CM

## 2023-03-05 DIAGNOSIS — C61 Malignant neoplasm of prostate: Secondary | ICD-10-CM | POA: Diagnosis not present

## 2023-03-05 DIAGNOSIS — E1122 Type 2 diabetes mellitus with diabetic chronic kidney disease: Secondary | ICD-10-CM | POA: Diagnosis not present

## 2023-03-05 DIAGNOSIS — E11621 Type 2 diabetes mellitus with foot ulcer: Secondary | ICD-10-CM | POA: Diagnosis not present

## 2023-03-05 DIAGNOSIS — E1121 Type 2 diabetes mellitus with diabetic nephropathy: Secondary | ICD-10-CM | POA: Diagnosis not present

## 2023-03-05 DIAGNOSIS — L97512 Non-pressure chronic ulcer of other part of right foot with fat layer exposed: Secondary | ICD-10-CM | POA: Diagnosis not present

## 2023-03-05 DIAGNOSIS — N189 Chronic kidney disease, unspecified: Secondary | ICD-10-CM | POA: Diagnosis not present

## 2023-03-05 LAB — GLUCOSE, CAPILLARY
Glucose-Capillary: 159 mg/dL — ABNORMAL HIGH (ref 70–99)
Glucose-Capillary: 246 mg/dL — ABNORMAL HIGH (ref 70–99)

## 2023-03-05 NOTE — Progress Notes (Signed)
[name], [name] ([id number]) [id number] Page 1 of 1 Visit Report for 03/04/2023 [name] Details Patient Name: Date of Service: [place]. 03/04/2023 Medical Record Number: [id number] Patient Account Number: [id number] Date of Birth/Sex: Treating RN: 01/05/1946 (77 y.o. 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 C61 Malignant neoplasm of prostate E11.621 Type 2 diabetes mellitus with foot ulcer L97.512 Non-pressure chronic ulcer of other part of right foot with fat layer exposed Facility Procedures CPT4 Code Description Modifier Quantity [id number] G0277-(Facility Use Only) HBOT full body chamber, 49min , 4 ICD-10 Diagnosis Description N30.41 Irradiation cystitis with hematuria C61 Malignant neoplasm of prostate E11.621 Type 2 diabetes mellitus with foot ulcer Physician Procedures Quantity CPT4 Code Description Modifier [id number] - WC PHYS HYPERBARIC OXYGEN THERAPY 1 ICD-10 Diagnosis Description N30.41 Irradiation cystitis with hematuria C61 Malignant neoplasm of prostate E11.621 Type 2 diabetes mellitus with foot ulcer Electronic Signature(s) Signed: 03/04/2023 3:53:40 PM By: [name] , , Signed: 03/04/2023 6:05:31 PM By: Worthy Keeler PA-C Entered By: [name] on 03/04/2023 11:26:40

## 2023-03-05 NOTE — Progress Notes (Signed)
ZURICH, [name] ([id number].pdf Page 1 of 2 Visit Report for 03/04/2023 HBO Details Patient Name: Date of Service: [name] 03/04/2023 8:00 A M Medical Record Number: [id number] Patient Account Number: [id number] Date of Birth/Sex: Treating RN: [birth year] (77 y.o. [name] Primary Care Dehlia Kilner: [name], [name] Other Clinician: [name] Referring Eldrige Pitkin: Treating Kenner Lewan/Extender: [name], [name] [name], [name] in Treatment: 10 HBO Treatment Course Details Treatment Course Number: 1 Ordering Dyron Kawano: [name] T Treatments Ordered: otal 40 HBO Treatment Start Date: 01/05/2023 HBO Indication: Late Effect of Radiation HBO Treatment Details Treatment Number: 39 Patient Type: Outpatient Chamber Type: Monoplace Chamber Serial #: [id number] Treatment Protocol: 2.5 ATA with 90 minutes oxygen, with two 5 minute air breaks Treatment Details Compression Rate Down: 1.5 psi / minute De-Compression Rate Up: 2.0 psi / minute A breaks and breathing ir Compress Tx Pressure periods Decompress Decompress Begins Reached (leave unused spaces Begins Ends blank) Chamber Pressure (ATA 1 2.5 2.5 2.5 2.5 2.5 - - 2.5 1 ) Clock Time (24 hr) 07:59 08:12 08:42 08:47 09:17 09:22 - - 09:52 10:03 Treatment Length: 124 (minutes) Treatment Segments: 4 Vital Signs Capillary Blood Glucose Reference Range: 80 - 120 mg / dl HBO Diabetic Blood Glucose Intervention Range: <131 mg/dl or >249 mg/dl Type: Time Vitals Blood Respiratory Capillary Blood Glucose Pulse Action Pulse: Temperature: Taken: Pressure: Rate: Glucose (mg/dl): Meter #: Oximetry (%) Taken: Pre 07:54 131/73 76 18 98.6 213 1 none per protocol Post 10:21 144/70 70 18 98.8 239 1 none per protocol Treatment Response Treatment Toleration: Well Treatment Completion Status: Treatment Completed without Adverse Event Electronic Signature(s) Signed: 03/04/2023 3:53:40 PM  By: [name] , , Signed: 03/04/2023 6:05:31 PM By: Worthy Keeler PA-C Entered By: [name] on 03/04/2023 11:26:09 -------------------------------------------------------------------------------- HBO Safety Checklist Details Patient Name: Date of Service: [name], [name] 03/04/2023 8:00 A M Medical Record Number: [id number] Patient Account Number: [id number] Date of Birth/Sex: Treating RN: [birth year] (77 y.o. [name] Primary Care Evangelyn Crouse: [name], [name] Other Clinician: [name] Referring Latesia Norrington: Treating Sammuel Blick/Extender: [name], [name] [name], [name] in Treatment: 10 HBO Safety Checklist Items Safety Checklist Consent Form Signed Patient voided / foley secured and emptied When did you last eato 0630 Last dose of injectable or oral agent [id number] Ostomy pouch emptied and vented if applicable NA Matas, [name] ([id number] Page 2 of 2 All implantable devices assessed, documented and approved NA Intravenous access site secured and place NA Valuables secured Linens and cotton and cotton/polyester blend (less than 51% polyester) Personal oil-based products / skin lotions / body lotions removed Wigs or hairpieces removed NA Smoking or tobacco materials removed NA Books / newspapers / magazines / loose paper removed Cologne, aftershave, perfume and deodorant removed Jewelry removed (may wrap wedding band) Make-up removed NA Hair care products removed Battery operated devices (external) removed Heating patches and chemical warmers removed Titanium eyewear removed Nail polish cured greater than 10 hours NA Casting material cured greater than 10 hours NA Hearing aids removed NA Loose dentures or partials removed Prosthetics have been removed NA Patient demonstrates correct use of air break device (if applicable) Patient concerns have been addressed Patient grounding  bracelet on and cord attached to chamber Specifics for Inpatients (complete in addition to above) Medication sheet sent with patient NA Intravenous medications needed or due during therapy sent with patient NA Drainage tubes (e.g. nasogastric tube or chest tube secured and vented)  NA Endotracheal or Tracheotomy tube secured NA Cuff deflated of air and inflated with saline NA Airway suctioned NA Notes Paper version used prior to treatment start. Electronic Signature(s) Signed: 03/04/2023 3:53:40 PM By: [name] , , Entered By: [name] on 03/04/2023 11:23:31

## 2023-03-05 NOTE — Progress Notes (Signed)
KYM, DRAWDY ([id number]) [id number] Page 1 of 2 Visit Report for 03/04/2023 Arrival Information Details Patient Name: Date of Service: [name] 03/04/2023 8:00 Bill M Medical Record Number: [id number] Patient Account Number: [id number] Date of Birth/Sex: Treating RN: 1946-07-27 (77 y.o. [name] Primary Care Robinn Overholt: [name], [name] Other Clinician: [name] Referring Efrain Clauson: Treating Laymond Postle/Extender: [name], [name] [name], [name] in Treatment: 10 Visit Information History Since Last Visit All ordered tests and consults were completed: Yes Patient Arrived: [name] Added or deleted any medications: No Arrival Time: 07:20 Any new allergies or adverse reactions: No Accompanied By: self Had Bill fall or experienced change in No Transfer Assistance: None activities of daily living that may affect Patient Identification Verified: Yes risk of falls: Secondary Verification Process Completed: Yes Signs or symptoms of abuse/neglect since last visito No Patient Requires Transmission-Based Precautions: No Hospitalized since last visit: No Patient Has Alerts: Yes Implantable device outside of the clinic excluding No Patient Alerts: Patient on [name] cellular tissue based products placed in the center since last visit: Pain Present Now: No Electronic Signature(s) Signed: 03/04/2023 3:53:40 PM By: [name] , , Entered By: [name] on 03/04/2023 11:21:48 -------------------------------------------------------------------------------- Encounter Discharge Information Details Patient Name: Date of Service: [name], [name] 03/04/2023 8:00 Bill M Medical Record Number: [id number] Patient Account Number: [id number] Date of Birth/Sex: Treating RN: 06-29-1946 (77 y.o. [name] Primary Care Cleda Imel: [name], [name] Other Clinician: [name] Referring Offie Pickron: Treating  Kaylum Shrum/Extender: [name], [name] [name], [name] in Treatment: 10 Encounter Discharge Information Items Discharge Condition: Stable Ambulatory Status: Walker Discharge Destination: Home Transportation: Private Auto Accompanied By: self Schedule Follow-up Appointment: No Clinical Summary of Care: Electronic Signature(s) Signed: 03/04/2023 3:53:40 PM By: [name] , , Entered By: [name] on 03/04/2023 11:27:21 -------------------------------------------------------------------------------- Vitals Details Patient Name: Date of Service: [name], [name] 03/04/2023 8:00 Bill M Medical Record Number: [id number] Patient Account Number: [id number] Date of Birth/Sex: Treating RN: [birth year] (77 y.o. [name] Primary Care Esther Broyles: [name], [name] Other Clinician: [name] Referring Joceline Hinchcliff: Treating Vastie Douty/Extender: [name], [name] [name], [name] in Treatment: 10 Vital Signs Time Taken: 07:54 Temperature (F): 98.6 [name], [name] ([id number]) [phone].pdf Page 2 of 2 Height (in): 71 Pulse (bpm): 76 Weight (lbs): 252 Respiratory Rate (breaths/min): 18 Body Mass Index (BMI): 35.1 [name]: 131/73 Capillary [name]: 213 Reference Range: 80 - 120 mg / dl Electronic Signature(s) Signed: 03/04/2023 3:53:40 PM By: [name] , , Entered By: [name] on 03/04/2023 11:22:17

## 2023-03-06 ENCOUNTER — Inpatient Hospital Stay: Payer: Medicare Other | Attending: Nurse Practitioner

## 2023-03-06 ENCOUNTER — Encounter (HOSPITAL_BASED_OUTPATIENT_CLINIC_OR_DEPARTMENT_OTHER): Payer: Medicare Other | Admitting: Internal Medicine

## 2023-03-06 DIAGNOSIS — I129 Hypertensive chronic kidney disease with stage 1 through stage 4 chronic kidney disease, or unspecified chronic kidney disease: Secondary | ICD-10-CM | POA: Diagnosis not present

## 2023-03-06 DIAGNOSIS — N3041 Irradiation cystitis with hematuria: Secondary | ICD-10-CM

## 2023-03-06 DIAGNOSIS — E1122 Type 2 diabetes mellitus with diabetic chronic kidney disease: Secondary | ICD-10-CM | POA: Diagnosis not present

## 2023-03-06 DIAGNOSIS — K2960 Other gastritis without bleeding: Secondary | ICD-10-CM

## 2023-03-06 DIAGNOSIS — N189 Chronic kidney disease, unspecified: Secondary | ICD-10-CM | POA: Diagnosis not present

## 2023-03-06 DIAGNOSIS — C251 Malignant neoplasm of body of pancreas: Secondary | ICD-10-CM | POA: Insufficient documentation

## 2023-03-06 DIAGNOSIS — E11621 Type 2 diabetes mellitus with foot ulcer: Secondary | ICD-10-CM

## 2023-03-06 DIAGNOSIS — Z95828 Presence of other vascular implants and grafts: Secondary | ICD-10-CM

## 2023-03-06 DIAGNOSIS — Z452 Encounter for adjustment and management of vascular access device: Secondary | ICD-10-CM | POA: Insufficient documentation

## 2023-03-06 DIAGNOSIS — Z9081 Acquired absence of spleen: Secondary | ICD-10-CM

## 2023-03-06 DIAGNOSIS — L97512 Non-pressure chronic ulcer of other part of right foot with fat layer exposed: Secondary | ICD-10-CM

## 2023-03-06 DIAGNOSIS — N1831 Chronic kidney disease, stage 3a: Secondary | ICD-10-CM

## 2023-03-06 DIAGNOSIS — E1121 Type 2 diabetes mellitus with diabetic nephropathy: Secondary | ICD-10-CM

## 2023-03-06 MED ORDER — SODIUM CHLORIDE 0.9% FLUSH: 10.0000 mL | Freq: Once | INTRAVENOUS | Status: DC

## 2023-03-06 MED ORDER — HEPARIN SOD (PORK) LOCK FLUSH 100 UNIT/ML IV SOLN: 500.0000 [IU] | Freq: Once | INTRAVENOUS | Status: DC

## 2023-03-06 NOTE — Progress Notes (Signed)
KAMIN, WAYE ([id number] Page 1 of 2 Visit Report for 03/05/2023 Arrival Information Details Patient Name: Date of Service: [name] 03/05/2023 8:00 [name] Medical Record Number: [id number] Patient Account Number: [id number] Date of Birth/Sex: Treating RN: [birth year] (77 y.o. [name] Primary Care Imir Brumbach: [name] Other Clinician: Valeria Batman Referring Johnatan Baskette: Treating Mehmet Scally/Extender: [name], Jessica [name] Weeks in Treatment: 10 Visit Information History Since Last Visit All ordered tests and consults were completed: Yes Patient Arrived: [name] Added or deleted any medications: No Arrival Time: 08:18 Any new allergies or adverse reactions: No Accompanied By: None Had Bill fall or experienced change in No Transfer Assistance: None activities of daily living that may affect Patient Identification Verified: Yes risk of falls: Secondary Verification Process Completed: Yes Signs or symptoms of abuse/neglect since last visito No Patient Requires Transmission-Based Precautions: No Hospitalized since last visit: No Patient Has Alerts: Yes Implantable device outside of the clinic excluding No Patient Alerts: Patient on [name] cellular tissue based products placed in the center since last visit: Pain Present Now: No Electronic Signature(s) Signed: 03/05/2023 2:26:23 PM By: Valeria Batman EMT Entered By: Valeria Batman on 03/05/2023 14:26:23 -------------------------------------------------------------------------------- Encounter Discharge Information Details Patient Name: Date of Service: [name], [name] 03/05/2023 8:00 [name] Medical Record Number: [id number] Patient Account Number: [id number] Date of Birth/Sex: Treating RN: 05-06-1946 (77 y.o. [name] Primary Care Rajvi Armentor: [name] Other Clinician: Valeria Batman Referring Analya Louissaint: Treating Adeola Dennen/Extender: [name],  Jessica [name] Weeks in Treatment: 10 Encounter Discharge Information Items Discharge Condition: Stable Ambulatory Status: Walker Discharge Destination: Home Transportation: Private Auto Accompanied By: None Schedule Follow-up Appointment: Yes Clinical Summary of Care: Electronic Signature(s) Signed: 03/05/2023 2:31:24 PM By: Valeria Batman EMT Entered By: Valeria Batman on 03/05/2023 14:31:24 -------------------------------------------------------------------------------- Vitals Details Patient Name: Date of Service: [name], [name] 03/05/2023 8:00 [name] Medical Record Number: [id number] Patient Account Number: [id number] Date of Birth/Sex: Treating RN: [birth year] (77 y.o. [name] Primary Care Nicolina Hirt: [name] Other Clinician: Valeria Batman Referring Belita Warsame: Treating Jamy Cleckler/Extender: [name], Jessica [name] Weeks in Treatment: 10 Vital Signs Time Taken: 07:53 Temperature (F): 97.9 [name], [name] ([id number]) [phone].pdf Page 2 of 2 Height (in): 71 Pulse (bpm): 85 Weight (lbs): 252 Respiratory Rate (breaths/min): 18 Body Mass Index (BMI): 35.1 [name]: 127/75 Capillary [name]: 159 Reference Range: 80 - 120 mg / dl Electronic Signature(s) Signed: 03/05/2023 2:27:09 PM By: Valeria Batman EMT Entered By: Valeria Batman on 03/05/2023 14:27:08

## 2023-03-06 NOTE — Progress Notes (Signed)
[name] ([id number]) [id number] Page 1 of 2 Visit Report for 03/05/2023 HBO Details Patient Name: Date of Service: [name] 03/05/2023 8:00 Bill M Medical Record Number: [id number] Patient Account Number: [id number] Date of Birth/Sex: Treating RN: Apr 06, 1946 (77 y.o. [name] Primary Care [name]: [name] Other Clinician: [name] Referring [name]: Treating [name]/Extender: [name] [name] Weeks in Treatment: 10 HBO Treatment Course Details Treatment Course Number: 1 Ordering [name]: [name] T Treatments Ordered: otal 40 HBO Treatment Start 01/05/2023 Date: HBO Indication: Late Effect of Radiation HBO Treatment End 03/05/2023 Date: HBO Discharge Treatment Series Complete; STRN/ORN Protocol Outcome: Goals Achieved HBO Treatment Details Treatment Number: 40 Patient Type: Outpatient Chamber Type: Monoplace Chamber Serial #: [id number] Treatment Protocol: 2.5 ATA with 90 minutes oxygen, with two 5 minute air breaks Treatment Details Compression Rate Down: 2.0 psi / minute De-Compression Rate Up: 2.0 psi / minute Bill breaks and breathing ir Compress Tx Pressure periods Decompress Decompress Begins Reached (leave unused spaces Begins Ends blank) Chamber Pressure (ATA 1 2.5 2.5 2.5 2.5 2.5 - - 2.5 1 ) Clock Time (24 hr) 08:00 08:13 08:43 08:48 09:18 09:23 - - 09:53 10:02 Treatment Length: 122 (minutes) Treatment Segments: 4 Vital Signs Capillary [name] Reference Range: 80 - 120 mg / dl HBO Diabetic [name] Intervention Range: <131 mg/dl or >249 mg/dl Time Vitals [name] Respiratory Capillary [name] Pulse Action Type: Pulse: Temperature: Taken: Pressure: Rate: Glucose (mg/dl): Meter #: Oximetry (%) Taken: Pre 07:53 127/75 85 18 97.9 159 Post 10:04 145/89 82 18 98.6 246 Treatment Response Treatment Toleration: Well Treatment Completion Status: Treatment Completed  without Adverse Event Physician HBO Attestation: I certify that I supervised this HBO treatment in accordance with Medicare guidelines. Bill trained emergency response team is readily available per Yes hospital policies and procedures. Continue HBOT as ordered. Yes Electronic Signature(s) Signed: 03/06/2023 11:11:47 AM By: Bill Shan DO Previous Signature: 03/05/2023 2:30:21 PM Version By: [name] EMT Entered By: [name] on 03/06/2023 09:57:04 -------------------------------------------------------------------------------- HBO Safety Checklist Details Patient Name: Date of Service: [name], [name] 03/05/2023 8:00 Bill M Medical Record Number: [id number] Patient Account Number: [id number] Date of Birth/Sex: Treating RN: 17-Oct-1946 (77 y.o. [name] Primary Care [name]: [name] Other Clinician: [name] Referring [name]: Treating [name]/Extender: [name] [name] Weeks in Treatment: [address] [place] ([id number]) [id number] Page 2 of 2 HBO Safety Checklist Items Safety Checklist Consent Form Signed Patient voided / foley secured and emptied When did you last eato 0630 Last dose of injectable or oral agent 0640 Ostomy pouch emptied and vented if applicable NA All implantable devices assessed, documented and approved NA Intravenous access site secured and place NA Valuables secured Linens and cotton and cotton/polyester blend (less than 51% polyester) Personal oil-based products / skin lotions / body lotions removed Wigs or hairpieces removed NA Smoking or tobacco materials removed Books / newspapers / magazines / loose paper removed Cologne, aftershave, perfume and deodorant removed Jewelry removed (may wrap wedding band) Make-up removed NA Hair care products removed Battery operated devices (external) removed Heating patches and chemical warmers removed Titanium eyewear  removed NA Nail polish cured greater than 10 hours NA Casting material cured greater than 10 hours NA Hearing aids removed NA Loose dentures or partials removed removed by patient Prosthetics have been removed NA Patient demonstrates correct use of air break device (if applicable) Patient concerns have been addressed Patient grounding bracelet on and  cord attached to chamber Specifics for Inpatients (complete in addition to above) Medication sheet sent with patient NA Intravenous medications needed or due during therapy sent with patient NA Drainage tubes (e.g. nasogastric tube or chest tube secured and vented) NA Endotracheal or Tracheotomy tube secured NA Cuff deflated of air and inflated with saline NA Airway suctioned NA Notes The safety checklist was done before the treatment was started. Electronic Signature(s) Signed: 03/05/2023 2:28:37 PM By: [name] EMT Entered By: [name] on 03/05/2023 14:28:37

## 2023-03-07 NOTE — Progress Notes (Signed)
[name], [name] ([id number]) [id number] Page 1 of 7 Visit Report for 03/06/2023 Chief Complaint Document Details Patient Name: Date of Service: [name] 03/06/2023 9:30 A M Medical Record Number: [id number] Patient Account Number: [id number] Date of Birth/Sex: Treating RN: 12/22/1945 (77 y.o. 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 03/06/2023 9:30 A M Medical Record Number: [id number] Patient Account Number: [id number] Date of Birth/Sex: Treating RN: 11-Jun-1946 (77 y.o. 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 For his prostate cancer patient was started on a ADT on 09/17/2012, and radiation therapy to a dose of 78 GY completed on 01/21/2013. Lupron #8/8 completed on 06/23/2014. He has described urinary urgency and frequency over the past several  years. He follows with urology last seen on 11/21/2022 and was referred to [place] for HBO therapy. During that visit he described symptoms of urinary urgency/frequency/weak stream along with gross hematuria. He had a cystoscopy on 03/04/2022 that showed evidence of radiation cystitis changes with some increased neovascularity along the posterior bladder and trigone with no suspicious area concerning for CIS or papillary lesions. He reports 2 episodes of gross hematuria over the past year. He currently denies signs of infection. 01/22/2023; patient presents for a right foot wound. He states the wound started out as a callus then opened and has remained this way for a year. He has been using antibiotic ointment to the area. He is not aggressively offloading this. He currently denies signs of infection. He is currently in HBO therapy for radiation cystitis and has been tolerating this well. 2/12; patient presents for follow-up at the right foot wound. He is agreeable with the cast placement today. 01-28-2023 upon evaluation today patient appears today for reevaluation here in the clinic. This is a wound currently on his foot which is being managed by Dr. Heber Aiken. With that being said it appears to be doing decently well today he had his first cast placed on Monday today he is here for his obligatory first cast change. Good news is nothing appears to be rubbing or causing any detriment anywhere on the leg at this point. 2/19; patient presents for follow-up. We have been doing a total contact cast to the right lower extremity with endoform. He has tolerated this well. 2/27; patient presents for follow-up. He has tolerated the total contact cast well. His wound is almost healed. Patient has completed 26 out of 40 HBO treatment sessions. He reports a decrease in urinary frequency and urgency. He feels like the HBO treatments are helping him. He has not mentioned any further UTIs. 3/4; patient presents for follow-up.  He had the total contact cast in  place over the past week. He states that the bottom is cracked. This was taken off today and wound is healed. 3/22; patient was concerned about chronic pain to the right foot where the previous wound was located to assure that it did not reopen. He is receiving his new orthotics today. He has completed HBO therapy and states that he feels the treatments were beneficial as they decreased his urinary frequency and urgency. Electronic Signature(s) Signed: 03/06/2023 9:56:06 AM By: [name] DO Entered By: [name] on 03/06/2023 09:53:41 Rote, [name] ([id number] Page 2 of 7 -------------------------------------------------------------------------------- Physical Exam Details Patient Name: Date of Service: [name] 03/06/2023 9:30 A M Medical Record Number: [id number] Patient Account Number: [id number] Date of Birth/Sex: Treating RN: [birth year] (77 y.o. M) Primary Care Provider: [name], [name] Other Clinician: Referring Provider: Treating Provider/Extender: Kahli Mayon [name], [name] in Treatment: 10 Constitutional respirations regular, non-labored and within target range for patient.. Cardiovascular 2+ dorsalis pedis/posterior tibialis pulses. Psychiatric pleasant and cooperative. Notes Right foot: T the fifth met head there is epithelialization. No signs of infection. o Electronic Signature(s) Signed: 03/06/2023 9:56:06 AM By: [name] DO Entered By: [name] on 03/06/2023 09:54:03 -------------------------------------------------------------------------------- Physician Orders Details Patient Name: Date of Service: [place], [name] 03/06/2023 9:30 A M Medical Record Number: [id number] Patient Account Number: [id number] Date of Birth/Sex: Treating RN: Sep 20, 1946 (77 y.o. 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 03/06/2023 9:30 A M Medical Record Number: [id number] Patient Account Number: [id number] Date of Birth/Sex: Treating RN: 1946/10/12 (77 y.o. 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 03/06/2023 9:30 A M Medical Record Number: [id number] Patient Account Number: [id number] Date of Birth/Sex: Treating RN: 1946/10/23 (77 y.o. M) Primary Care Provider: [name], [name] Other Clinician: Referring Provider: Treating Provider/Extender: Troy Hartzog [name], [name] in Treatment: 10 Subjective Chief Complaint Information obtained from Patient 12/22/2022; referral for hyperbaric oxygen therapy in the setting of radiation cystitis, 2/8; right fifth met head plantar foot wound History of Present Illness (HPI) 12/22/2022 Mr. 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 For his prostate cancer patient was started on a ADT on 09/17/2012, and radiation therapy to a dose of 78 GY completed on 01/21/2013. Lupron #8/8 completed on 06/23/2014. He has described urinary urgency and frequency over the past several years. He follows with urology last seen on 11/21/2022 and was referred to [place] for HBO therapy. During that visit he described symptoms of urinary urgency/frequency/weak stream along with gross hematuria. He had a cystoscopy on 03/04/2022 that showed evidence of radiation cystitis changes with some increased neovascularity along the posterior bladder and trigone with no suspicious area concerning for CIS or papillary lesions. He reports 2 episodes of gross hematuria over the past year. He currently denies signs of infection. 01/22/2023; patient presents for a right foot wound. He states the wound started out as a  callus then opened and has remained this way for a year. He has been using antibiotic ointment to the area. He is not aggressively offloading this. He currently denies signs of infection. He is currently in HBO therapy for radiation cystitis and has been tolerating this well. 2/12; patient presents for follow-up at the right foot wound. He is agreeable with the cast placement today. 01-28-2023 upon evaluation today patient appears today for reevaluation here in the clinic. This is a wound currently on his foot which is being managed by Dr. Heber Morley. With that being said it appears to be doing decently well today he had his first cast placed on Monday today he is here for his obligatory first cast change. Good news is nothing appears to be rubbing or causing any detriment anywhere on the leg at this point. 2/19; patient presents for follow-up. We have been doing a total contact cast to the right lower extremity with endoform. He has tolerated this well. 2/27; patient presents for follow-up. He has tolerated the total contact cast well. His wound is almost healed. Patient has completed 26 out of Luke Pesci, Jaleel L ([id number]) [phone].pdf Page 4 of 7 treatment sessions. He reports a decrease in urinary frequency and urgency. He feels like the HBO treatments are helping him. He has not mentioned any further UTIs. 3/4; patient presents for follow-up. He had the total contact cast in place over the past week. He states that the bottom is cracked. This was taken off today and wound is healed. 3/22; patient was concerned about chronic pain to the right foot where the previous wound was located to assure that it did not reopen. He is receiving his new orthotics today. He has completed HBO therapy and states that he feels the treatments were beneficial as they decreased his urinary frequency and urgency. Patient History Information obtained from Patient, Chart. Family  History Unknown History. Social History Former smoker - quit in 1976, Marital Status - Married, Alcohol Use - Never, Drug Use - No History, Caffeine Use - Rarely. Medical History Hematologic/Lymphatic Patient has history of Anemia Respiratory Patient has history of Sleep Apnea - uses CPAP Cardiovascular Patient has history of Arrhythmia - A fibb, Coronary Artery Disease, Hypertension, Myocardial Infarction Endocrine Patient has history of Type II Diabetes Musculoskeletal Patient has history of Osteoarthritis Neurologic Patient has history of Neuropathy Oncologic Patient has history of Received Chemotherapy -  2022- Pancreatic Ca with chemo, Received Radiation - 10-12 years ago for prostate Ca Psychiatric Denies history of Confinement Anxiety Hospitalization/Surgery History - EUS. - Biopsy. - CA bypass graft. - cholecystectomy. - tonsillectomy. - appendectomy. - triple bypass. Medical A Surgical History Notes nd Cardiovascular hyperlipidemia Gastrointestinal fatty liver; GERD Genitourinary radiation cystitis, CKD, blood in urine Oncologic hx prostate ca Psychiatric PTSD Objective Constitutional respirations regular, non-labored and within target range for patient.. Vitals Time Taken: 9:23 AM, Height: 71 in, Weight: 252 lbs, BMI: 35.1, Pulse: 83 bpm, Respiratory Rate: 18 breaths/min, Blood Pressure: 132/75 mmHg. Cardiovascular 2+ dorsalis pedis/posterior tibialis pulses. Psychiatric pleasant and cooperative. General Notes: Right foot: T the fifth met head there is epithelialization. No signs of infection. o Assessment Active Problems ICD-10 Irradiation cystitis with hematuria Essential (primary) hypertension Type 2 diabetes mellitus with diabetic nephropathy Malignant neoplasm of prostate [place], Draydon L ([id number] Page 5 of 7 Type 2 diabetes mellitus with foot ulcer Non-pressure chronic ulcer of other part of right foot  with fat layer exposed Patient's right plantar foot wound remains closed. I recommended he follow-up with his podiatrist for any ongoing issues. He is picking up his new orthotics today. He completed HBO therapy with benefit. He may follow-up as needed. Plan Discharge From [facility] Services: Discharge from [place] 1. Discharge from clinic due to closed wound 2. HBO therapy completed 3. Follow-up as needed Electronic Signature(s) Signed: 03/06/2023 9:56:06 AM By: [name] DO Entered By: [name] on 03/06/2023 09:55:25 -------------------------------------------------------------------------------- HxROS Details Patient Name: Date of Service: [place], [name] 03/06/2023 9:30 A M Medical Record Number: [id number] Patient Account Number: [id number] Date of Birth/Sex: Treating RN: 04/24/1946 (77 y.o. M) Primary Care Provider: [name], [name] Other Clinician: Referring Provider: Treating Provider/Extender: Mystery Schrupp [name], [name] in Treatment: 10 Information Obtained From Patient Chart Hematologic/Lymphatic Medical History: Positive for: Anemia Respiratory Medical History: Positive for: Sleep Apnea - uses CPAP Cardiovascular Medical History: Positive for: Arrhythmia - A fibb; Coronary Artery Disease; Hypertension; Myocardial Infarction Past Medical History Notes: hyperlipidemia Gastrointestinal Medical History: Past Medical History Notes: fatty liver; GERD Endocrine Medical History: Positive for: Type II Diabetes Time with diabetes: 10 years Treated with: Insulin Blood sugar tested every day: Yes Tested : TID Genitourinary Medical History: Past Medical History Notes: radiation cystitis, CKD, blood in urine Baskett, Alfonzo L ([id number]) [phone].pdf Page 6 of 7 Musculoskeletal Medical History: Positive for: Osteoarthritis Neurologic Medical History: Positive for: Neuropathy Oncologic Medical  History: Positive for: Received Chemotherapy - 2022- Pancreatic Ca with chemo; Received Radiation - 10-12 years ago for prostate Ca Past Medical History Notes: hx prostate ca Psychiatric Medical History: Negative for: Confinement Anxiety Past Medical History Notes: PTSD Immunizations Pneumococcal Vaccine: Received Pneumococcal Vaccination: Yes Received Pneumococcal Vaccination On or After 60th Birthday: Yes Implantable Devices None Hospitalization / Surgery History Type of Hospitalization/Surgery EUS Biopsy CA bypass graft cholecystectomy tonsillectomy appendectomy triple bypass Family and Social History Unknown History: Yes; Former smoker - quit in 1976; Marital Status - Married; Alcohol Use: Never; Drug Use: No History; Caffeine Use: Rarely; Financial Concerns: No; Food, Clothing or Shelter Needs: No; Support System Lacking: No; Transportation Concerns: No Electronic Signature(s) Signed: 03/06/2023 9:56:06 AM By: [name] DO Entered By: [name] on 03/06/2023 09:53:47 -------------------------------------------------------------------------------- [name] Details Patient Name: Date of Service: SPA Tegh Franek Priest L. 03/06/2023 Medical Record Number: [id number] Patient Account Number: [id number] Date of Birth/Sex: Treating RN: 08-25-1946 (77 y.o. 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 C61 Malignant neoplasm of prostate E11.621 Type 2 diabetes mellitus with foot ulcer Bickert, Chai L ([id number] Page 7 of 7 L97.512 Non-pressure chronic ulcer of other part of right foot with fat layer  exposed Facility Procedures : CPT4 Code: [id number] Description: [phone] - WOUND CARE VISIT-LEV 2 EST PT Modifier: Quantity: 1 Physician Procedures : CPT4 Code Description Modifier [id number] - WC PHYS LEVEL 3 - EST PT ICD-10 Diagnosis Description N30.41 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

## 2023-03-07 NOTE — Progress Notes (Signed)
ANKIT, KOPECKY ([id number]) [id number] Page 1 of 2 Visit Report for 03/05/2023 Problem List Details Patient Name: Date of Service: [name]. 03/05/2023 8:00 A M Medical Record Number: [id number] Patient Account Number: [id number] Date of Birth/Sex: Treating RN: [birth year] (77 y.o. 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 03/05/2023 Medical Record Number: [id number] Patient Account Number: [id number] Date of Birth/Sex: Treating RN: [birth year] (77 y.o. 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 Facility Procedures : CPT4 Code: [id number] Description: G0277-(Facility Use Only) HBOT full body chamber, 2min , ICD-10 Diagnosis Description N30.41 Irradiation cystitis with hematuria C61 Malignant neoplasm of prostate E11.621 Type 2 diabetes mellitus with foot ulcer Modifier: Quantity: 4 Physician Procedures : CPT4 Code Description Modifier [id number] - WC PHYS HYPERBARIC OXYGEN THERAPY ICD-10 Diagnosis Description N30.41 Irradiation cystitis with hematuria C61 Malignant neoplasm of prostate E11.621 Type 2 diabetes mellitus with foot ulcer Quantity: 1 Electronic Signature(s) Signed: 03/05/2023 2:30:47 PM By: Valeria Batman EMT Signed: 03/06/2023 11:11:47 AM By: [name] DO Entered By: Valeria Batman on 03/05/2023 14:30:47

## 2023-03-09 NOTE — Progress Notes (Signed)
JEDREK, FIRPO ([id number]) [id number] Page 1 of 5 Visit Report for 03/06/2023 Arrival Information Details Patient Name: Date of Service: [name] 03/06/2023 9:30 Bill M Medical Record Number: [id number] Patient Account Number: [id number] Date of Birth/Sex: Treating RN: Bill-03-[name] (77 y.o. M) Primary Care [name]: [name] Other Clinician: Referring [name]: Treating [name]/Extender: [name] [name]: 10 Visit Information History Since Last Visit Added or deleted any medications: No Patient Arrived: [name] Any new allergies or adverse reactions: No Arrival Time: 09:[name] Had Bill fall or experienced change in No Accompanied By: self activities of daily living that Bill affect Transfer Assistance: None risk of falls: Patient Identification Verified: Yes Signs or symptoms of abuse/neglect since last visito No Secondary Verification Process Completed: Yes Hospitalized since last visit: No Patient Requires Transmission-Based Precautions: No Implantable device outside of the clinic excluding No Patient Has Alerts: Yes cellular tissue based products placed in the center Patient Alerts: Patient on [name] since last visit: Pain Present Now: Yes Electronic Signature(s) Signed: 03/09/2023 4:16:21 PM By: [name] Entered By: [name] on 03/06/2023 09:[name]:54 -------------------------------------------------------------------------------- Clinic Level of Care Assessment Details Patient Name: Date of Service: [name] 03/06/2023 9:30 Bill M Medical Record Number: [id number] Patient Account Number: [id number] Date of Birth/Sex: Treating RN: [name] (77 y.o. [name], [name] Primary Care [name]: [name] Other Clinician: Referring [name]: Treating [name]/Extender: [name] [name]: 10 Clinic Level of Care Assessment Items TOOL 4 Quantity  Score X- 1 0 Use when only an EandM is performed on FOLLOW-UP visit ASSESSMENTS - Nursing Assessment / Reassessment X- 1 10 Reassessment of Co-morbidities (includes updates in patient status) X- 1 5 Reassessment of Adherence to [name] Plan ASSESSMENTS - Wound and Skin Bill ssessment / Reassessment []  - 0 Simple Wound Assessment / Reassessment - one wound []  - 0 Complex Wound Assessment / Reassessment - multiple wounds []  - 0 Dermatologic / Skin Assessment (not related to wound area) ASSESSMENTS - Focused Assessment []  - 0 Circumferential Edema Measurements - multi extremities []  - 0 Nutritional Assessment / Counseling / Intervention []  - 0 Lower Extremity Assessment (monofilament, tuning fork, pulses) []  - 0 Peripheral Arterial Disease Assessment (using hand held doppler) ASSESSMENTS - Ostomy and/or Continence Assessment and Care []  - 0 Incontinence Assessment and Management []  - 0 Ostomy Care Assessment and Management (repouching, etc.) PROCESS - Coordination of Care X - Simple Patient / Family Education for ongoing care 1 15 [name], [name] ([id number]) [id number] Page 2 of 5 []  - 0 Complex (extensive) Patient / Family Education for ongoing care X- 1 10 Staff obtains Programmer, systems, Records, T Results / Process Orders est []  - 0 Staff telephones HHA, Nursing Homes / Clarify orders / etc []  - 0 Routine Transfer to another Facility (non-emergent condition) []  - 0 Routine [name] Admission (non-emergent condition) []  - 0 New Admissions / Biomedical engineer / Ordering NPWT Apligraf, etc. , []  - 0 Emergency [name] Admission (emergent condition) X- 1 10 Simple Discharge Coordination []  - 0 Complex (extensive) Discharge Coordination PROCESS - Special Needs []  - 0 Pediatric / Minor Patient Management []  - 0 Isolation Patient Management []  - 0 Hearing / Language / Visual special needs []  - 0 Assessment of Community assistance  (transportation, D/C planning, etc.) []  - 0 Additional assistance / Altered mentation []  - 0 Support Surface(s) Assessment (bed, cushion, seat, etc.) INTERVENTIONS - Wound Cleansing / Measurement []  - 0 Simple Wound  Cleansing - one wound []  - 0 Complex Wound Cleansing - multiple wounds []  - 0 Wound Imaging (photographs - any number of wounds) []  - 0 Wound Tracing (instead of photographs) []  - 0 Simple Wound Measurement - one wound []  - 0 Complex Wound Measurement - multiple wounds INTERVENTIONS - Wound Dressings []  - 0 Small Wound Dressing one or multiple wounds []  - 0 Medium Wound Dressing one or multiple wounds []  - 0 Large Wound Dressing one or multiple wounds []  - 0 Application of Medications - topical []  - 0 Application of Medications - injection INTERVENTIONS - Miscellaneous []  - 0 External ear exam []  - 0 Specimen Collection (cultures, biopsies, [name], body fluids, etc.) []  - 0 Specimen(s) / Culture(s) sent or taken to Lab for analysis []  - 0 Patient Transfer (multiple staff / Civil Service fast streamer / Similar devices) []  - 0 Simple Staple / Suture removal (25 or less) []  - 0 Complex Staple / Suture removal (26 or more) []  - 0 Hypo / Hyperglycemic Management (close monitor of [name]) []  - 0 Ankle / Brachial Index (ABI) - do not check if billed separately X- 1 5 Vital Signs Has the patient been seen at the [name] within the last three years: Yes Total Score: 55 Level Of Care: New/Established - Level 2 Electronic Signature(s) Signed: 03/06/2023 11:12:19 AM By: Bill Hammock RN Entered By: [name] on 03/06/2023 09:50:10 [name] ([id number] Page 3 of 5 -------------------------------------------------------------------------------- Encounter Discharge Information Details Patient Name: Date of Service: [name] 03/06/2023 9:30 Bill M Medical Record Number: [id number] Patient Account  Number: [id number] Date of Birth/Sex: Treating RN: [name] (77 y.o. [name], [name] Primary Care Amore Grater: [name] Other Clinician: Referring Mataio Mele: Treating Labarron Durnin/Extender: [name] [name]: 10 Encounter Discharge Information Items Discharge Condition: Stable Ambulatory Status: Walker Discharge Destination: Home Transportation: Private Auto Accompanied By: self Schedule Follow-up Appointment: Yes Clinical Summary of Care: Patient Declined Electronic Signature(s) Signed: 03/06/2023 11:12:19 AM By: Bill Hammock RN Entered By: [name] on 03/06/2023 09:51:03 -------------------------------------------------------------------------------- Lower Extremity Assessment Details Patient Name: Date of Service: [name]. 03/06/2023 9:30 Bill M Medical Record Number: [id number] Patient Account Number: [id number] Date of Birth/Sex: Treating RN: August [name], Bill (77 y.o. M) Primary Care Durante Violett: [name] Other Clinician: Referring Rox Mcgriff: Treating Sawsan Riggio/Extender: [name] [name]: 10 Edema Assessment Assessed: [Left: No] [Right: No] Edema: [Left: N] [Right: o] Calf Left: Right: Point of Measurement: 38 cm From Medial Instep 42 cm Ankle Left: Right: Point of Measurement: 13 cm From Medial Instep 22 cm Electronic Signature(s) Signed: 03/09/2023 4:16:21 PM By: [name] Entered By: [name] on 03/06/2023 09:26:07 -------------------------------------------------------------------------------- Multi Wound Chart Details Patient Name: Date of Service: [name], [name] 03/06/2023 9:30 Bill M Medical Record Number: [id number] Patient Account Number: [id number] Date of Birth/Sex: Treating RN: [birth year]-Sep-Bill (77 y.o. M) Primary Care Amand Lemoine: [name] Other Clinician: Referring Jennife Zaucha: Treating Sopheap Basic/Extender: [name] [name]:  10 Vital Signs Height(in): 71 Pulse(bpm): 83 Weight(lbs): 252 [name] Pressure(mmHg): 132/75 Body Mass Index(BMI): 35.1 Temperature(F): Respiratory Rate(breaths/min): [name], [name] ([id number]) [phone].pdf Page 4 of 5 [[name] Notes:Wound Assessments [name] Notes] Electronic Signature(s) Signed: 03/06/2023 9:56:06 AM By: [name] DO Entered By: [name] on 03/06/2023 09:48:57 -------------------------------------------------------------------------------- Multi-Disciplinary Care Plan Details Patient Name: Date of Service: [name], [name] 03/06/2023 9:30 Bill M Medical Record Number: [id number] Patient Account Number: [id number]  Date of Birth/Sex: Treating RN: [name], [name] Primary Care Meerab Maselli: [name] Other Clinician: Referring Zian Delair: Treating Nannette Zill/Extender: [name] [name]: 10 Active Inactive Electronic Signature(s) Signed: 03/06/2023 11:12:19 AM By: Bill Hammock RN Entered By: [name] on 03/06/2023 09:49:18 -------------------------------------------------------------------------------- Pain Assessment Details Patient Name: Date of Service: [name], [name] 03/06/2023 9:30 Bill M Medical Record Number: [id number] Patient Account Number: [id number] Date of Birth/Sex: Treating RN: [birth year] (77 y.o. M) Primary Care Imagene Boss: [name] Other Clinician: Referring Trevone Prestwood: Treating Akesha Uresti/Extender: [name] [name]: 10 Active Problems Location of Pain Severity and Description of Pain Patient Has Paino Yes Site Locations Pain Location: Pain in Ulcers Rate the pain. Current Pain Level: 2 Pain Management and Medication Current Pain Management: Electronic Signature(s) Signed: 03/09/2023 4:16:21 PM By: [name] Entered By: [name] on 03/06/2023 09:25:05 [name]  ([id number] Page 5 of 5 -------------------------------------------------------------------------------- Patient/Caregiver Education Details Patient Name: Date of Service: [name] 3/22/2024andnbsp9:30 [place] Record Number: [id number] Patient Account Number: [id number] Date of Birth/Gender: Treating RN: 10[birth year]-Bill (77 y.o. [name] Primary Care Physician: [name] Other Clinician: Referring Physician: Treating Physician/Extender: [name] [name]: 10 Education Assessment Education Provided To: Patient Education Topics Provided Wound/Skin Impairment: Methods: Explain/Verbal Responses: Reinforcements needed, State content correctly Electronic Signature(s) Signed: 03/06/2023 11:12:19 AM By: Bill Hammock RN Entered By: [name] on 03/06/2023 09:49:48 -------------------------------------------------------------------------------- Vitals Details Patient Name: Date of Service: [name], [name] 03/06/2023 9:30 Bill M Medical Record Number: [id number] Patient Account Number: [id number] Date of Birth/Sex: Treating RN: [birth year]-Bill (77 y.o. M) Primary Care Aubry Rankin: [name] Other Clinician: Referring Margareta Laureano: Treating Winola Drum/Extender: [name] [name]: 10 Vital Signs Time Taken: 09:[name] Pulse (bpm): 83 Height (in): 71 Respiratory Rate (breaths/min): 18 Weight (lbs): 252 [name]: 132/75 Body Mass Index (BMI): 35.1 Reference Range: 80 - 120 mg / dl Electronic Signature(s) Signed: 03/09/2023 4:16:21 PM By: [name] Entered By: [name] on 03/06/2023 09:24:54

## 2023-03-19 DIAGNOSIS — E119 Type 2 diabetes mellitus without complications: Secondary | ICD-10-CM | POA: Diagnosis not present

## 2023-03-19 DIAGNOSIS — Z961 Presence of intraocular lens: Secondary | ICD-10-CM | POA: Diagnosis not present

## 2023-03-23 ENCOUNTER — Encounter: Payer: Self-pay | Admitting: Podiatry

## 2023-03-23 ENCOUNTER — Ambulatory Visit (INDEPENDENT_AMBULATORY_CARE_PROVIDER_SITE_OTHER): Payer: Medicare Other | Admitting: Podiatry

## 2023-03-23 DIAGNOSIS — M79674 Pain in right toe(s): Secondary | ICD-10-CM

## 2023-03-23 DIAGNOSIS — B351 Tinea unguium: Secondary | ICD-10-CM

## 2023-03-23 DIAGNOSIS — M79675 Pain in left toe(s): Secondary | ICD-10-CM

## 2023-03-23 DIAGNOSIS — E1122 Type 2 diabetes mellitus with diabetic chronic kidney disease: Secondary | ICD-10-CM

## 2023-03-23 DIAGNOSIS — N183 Chronic kidney disease, stage 3 unspecified: Secondary | ICD-10-CM

## 2023-03-23 DIAGNOSIS — Z794 Long term (current) use of insulin: Secondary | ICD-10-CM

## 2023-03-23 NOTE — Progress Notes (Signed)
This patient returns to my office for at risk foot care.  This patient requires this care by a professional since this patient will be at risk due to having diabetes.  This patient is unable to cut nails himself since the patient cannot reach his nails.These nails are painful walking and wearing shoes.  This patient presents for at risk foot care today.  General Appearance  Alert, conversant and in no acute stress.  Vascular  Dorsalis pedis and posterior tibial  pulses are palpable  bilaterally.  Capillary return is within normal limits  bilaterally. Temperature is within normal limits  bilaterally.  Neurologic  Senn-Weinstein monofilament wire test diminished   bilaterally. Muscle power within normal limits bilaterally.  Nails Thick disfigured discolored nails with subungual debris  hallux bilaterally. No evidence of bacterial infection or drainage bilaterally.  Orthopedic  No limitations of motion  feet .  No crepitus or effusions noted.  No bony pathology or digital deformities noted.  Skin  normotropic skin with no porokeratosis noted bilaterally.  No signs of infections or ulcers noted.     Onychomycosis  Pain in right toes  Pain in left toes  Consent was obtained for treatment procedures.   Mechanical debridement of nails 1-5  bilaterally performed with a nail nipper.  Filed with dremel without incident.  Dispense diabetic shoes.  RTC 4 months                       Told patient to return for periodic foot care and evaluation due to potential at risk complications.   Helane Gunther DPM

## 2023-04-01 ENCOUNTER — Ambulatory Visit: Payer: Medicare Other | Admitting: Podiatry

## 2023-04-16 ENCOUNTER — Ambulatory Visit (INDEPENDENT_AMBULATORY_CARE_PROVIDER_SITE_OTHER): Payer: Medicare Other | Admitting: Podiatry

## 2023-04-16 DIAGNOSIS — M216X9 Other acquired deformities of unspecified foot: Secondary | ICD-10-CM

## 2023-04-16 NOTE — Progress Notes (Signed)
  Subjective:  Patient ID: [name], male    DOB: May 14, 1946,  MRN: [id number]  Chief Complaint  Patient presents with   Bunions    est - right foot underneath 5th toe - coldsore - diabetic - very sore & tender - requested [name] /adjust orthotics they were to have a cut out?    77 y.o. male presents with the above complaint. History confirmed with patient.   Objective:  Physical Exam: warm, good capillary refill, no trophic changes or ulcerative lesions, normal DP and PT pulses, and prominent plantar fifth metatarsal right foot with callus formation.  Assessment:   1. Plantar flexed metatarsal, unspecified laterality      Plan:  Patient was evaluated and treated and all questions answered.  Diabetic shoes and insoles inspected, did not have unload under fifth metatarsal.  Inserts will be sent back for adjustment for unloading at area of lesion.  They should continue utilizing moisturizing cream.  Did not require debridement today.  I will see him back as scheduled for his high risk diabetic footcare  No follow-ups on file.

## 2023-04-17 ENCOUNTER — Inpatient Hospital Stay: Payer: Medicare Other

## 2023-04-17 ENCOUNTER — Inpatient Hospital Stay: Payer: Medicare Other | Attending: Nurse Practitioner

## 2023-04-17 ENCOUNTER — Inpatient Hospital Stay (HOSPITAL_BASED_OUTPATIENT_CLINIC_OR_DEPARTMENT_OTHER): Payer: Medicare Other | Admitting: Oncology

## 2023-04-17 VITALS — BP 150/71 | HR 80 | Temp 98.2°F | Resp 18 | Ht 71.0 in | Wt 259.0 lb

## 2023-04-17 DIAGNOSIS — K2960 Other gastritis without bleeding: Secondary | ICD-10-CM

## 2023-04-17 DIAGNOSIS — E1159 Type 2 diabetes mellitus with other circulatory complications: Secondary | ICD-10-CM | POA: Diagnosis not present

## 2023-04-17 DIAGNOSIS — C251 Malignant neoplasm of body of pancreas: Secondary | ICD-10-CM

## 2023-04-17 DIAGNOSIS — Z9081 Acquired absence of spleen: Secondary | ICD-10-CM | POA: Diagnosis not present

## 2023-04-17 DIAGNOSIS — I10 Essential (primary) hypertension: Secondary | ICD-10-CM | POA: Insufficient documentation

## 2023-04-17 DIAGNOSIS — Z95828 Presence of other vascular implants and grafts: Secondary | ICD-10-CM

## 2023-04-17 DIAGNOSIS — E11622 Type 2 diabetes mellitus with other skin ulcer: Secondary | ICD-10-CM | POA: Diagnosis not present

## 2023-04-17 DIAGNOSIS — I4891 Unspecified atrial fibrillation: Secondary | ICD-10-CM | POA: Insufficient documentation

## 2023-04-17 DIAGNOSIS — Z7901 Long term (current) use of anticoagulants: Secondary | ICD-10-CM | POA: Insufficient documentation

## 2023-04-17 DIAGNOSIS — I2510 Atherosclerotic heart disease of native coronary artery without angina pectoris: Secondary | ICD-10-CM | POA: Insufficient documentation

## 2023-04-17 DIAGNOSIS — N1831 Chronic kidney disease, stage 3a: Secondary | ICD-10-CM

## 2023-04-17 DIAGNOSIS — D649 Anemia, unspecified: Secondary | ICD-10-CM | POA: Diagnosis not present

## 2023-04-17 DIAGNOSIS — E1140 Type 2 diabetes mellitus with diabetic neuropathy, unspecified: Secondary | ICD-10-CM | POA: Insufficient documentation

## 2023-04-17 DIAGNOSIS — N289 Disorder of kidney and ureter, unspecified: Secondary | ICD-10-CM | POA: Insufficient documentation

## 2023-04-17 LAB — CBC WITH DIFFERENTIAL (CANCER CENTER ONLY)
Abs Immature Granulocytes: 0.01 10*3/uL (ref 0.00–0.07)
Basophils Absolute: 0 10*3/uL (ref 0.0–0.1)
Basophils Relative: 0 %
Eosinophils Absolute: 0.1 10*3/uL (ref 0.0–0.5)
Eosinophils Relative: 1 %
HCT: 37.3 % — ABNORMAL LOW (ref 39.0–52.0)
Hemoglobin: 12.3 g/dL — ABNORMAL LOW (ref 13.0–17.0)
Immature Granulocytes: 0 %
Lymphocytes Relative: 39 %
Lymphs Abs: 3.8 10*3/uL (ref 0.7–4.0)
MCH: 32.5 pg (ref 26.0–34.0)
MCHC: 33 g/dL (ref 30.0–36.0)
MCV: 98.4 fL (ref 80.0–100.0)
Monocytes Absolute: 1 10*3/uL (ref 0.1–1.0)
Monocytes Relative: 10 %
Neutro Abs: 4.8 10*3/uL (ref 1.7–7.7)
Neutrophils Relative %: 50 %
Platelet Count: 256 10*3/uL (ref 150–400)
RBC: 3.79 MIL/uL — ABNORMAL LOW (ref 4.22–5.81)
RDW: 14 % (ref 11.5–15.5)
WBC Count: 9.7 10*3/uL (ref 4.0–10.5)
nRBC: 0 % (ref 0.0–0.2)

## 2023-04-17 LAB — CMP (CANCER CENTER ONLY)
ALT: 14 U/L (ref 0–44)
AST: 23 U/L (ref 15–41)
Albumin: 3.8 g/dL (ref 3.5–5.0)
Alkaline Phosphatase: 99 U/L (ref 38–126)
Anion gap: 6 (ref 5–15)
BUN: 32 mg/dL — ABNORMAL HIGH (ref 8–23)
CO2: 25 mmol/L (ref 22–32)
Calcium: 9.1 mg/dL (ref 8.9–10.3)
Chloride: 104 mmol/L (ref 98–111)
Creatinine: 1.34 mg/dL — ABNORMAL HIGH (ref 0.61–1.24)
GFR, Estimated: 54 mL/min — ABNORMAL LOW (ref >60)
Glucose, Bld: 217 mg/dL — ABNORMAL HIGH (ref 70–99)
Potassium: 4.2 mmol/L (ref 3.5–5.1)
Sodium: 135 mmol/L (ref 135–145)
Total Bilirubin: 0.7 mg/dL (ref 0.3–1.2)
Total Protein: 7 g/dL (ref 6.5–8.1)

## 2023-04-17 MED ORDER — SODIUM CHLORIDE 0.9% FLUSH
10.0000 mL | Freq: Once | INTRAVENOUS | Status: AC
  Administered 2023-04-17: 10 mL

## 2023-04-17 MED ORDER — HEPARIN SOD (PORK) LOCK FLUSH 100 UNIT/ML IV SOLN
500.0000 [IU] | Freq: Once | INTRAVENOUS | Status: AC
  Administered 2023-04-17: 500 [IU]

## 2023-04-17 NOTE — Progress Notes (Signed)
[facility] OFFICE PROGRESS NOTE   Diagnosis: Pancreas cancer  INTERVAL HISTORY:   [name] returns as scheduled.  He feels well.  Good appetite.  He has a "callus "at the plantar surface of the right foot.  The callus is painful.  He is followed by podiatry and the wound clinic.  He reports stable chronic swelling of the right leg.  Objective:  Vital signs in last 24 hours:  Blood pressure (!) 150/71, pulse 80, temperature 98.2 F (36.8 C), temperature source Oral, resp. rate 18, height 5\' 11"  (1.803 m), weight 259 lb (117.5 kg), SpO2 98 %.    Lymphatics: No cervical, supraclavicular, axillary, or inguinal nodes Resp: Lungs clear bilaterally Cardio: Regular rate and rhythm with premature beats GI: No hepatosplenomegaly Vascular: Edema at the right lower leg  Skin: Callus at the right fifth metatarsal with a central ulcer  Portacath/PICC-without erythema  Lab Results:  Lab Results  Component Value Date   WBC 9.7 04/17/2023   HGB 12.3 (L) 04/17/2023   HCT 37.3 (L) 04/17/2023   MCV 98.4 04/17/2023   PLT 256 04/17/2023   NEUTROABS 4.8 04/17/2023    CMP  Lab Results  Component Value Date   NA 135 04/17/2023   K 4.2 04/17/2023   CL 104 04/17/2023   CO2 25 04/17/2023   GLUCOSE 217 (H) 04/17/2023   BUN 32 (H) 04/17/2023   CREATININE 1.34 (H) 04/17/2023   CALCIUM 9.1 04/17/2023   PROT 7.0 04/17/2023   ALBUMIN 3.8 04/17/2023   AST 23 04/17/2023   ALT 14 04/17/2023   ALKPHOS 99 04/17/2023   BILITOT 0.7 04/17/2023   GFRNONAA 54 (L) 04/17/2023   GFRAA 49 (L) 10/10/2020    Lab Results  Component Value Date   ZOX096 24 01/23/2023     Medications: I have reviewed the patient's current medications.   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  No pancreatic ductal dilatation identified.  No definite signs of metastatic disease in the chest, abdomen or pelvis. Cycle 1 gemcitabine/Abraxane 03/01/2021 Cycle 2 gemcitabine/Abraxane 03/14/2021 Cycle 3 gemcitabine/Abraxane 03/28/2021 Cycle 4 gemcitabine/Abraxane 04/17/2021 Cycle 5 gemcitabine/Abraxane 05/01/2021, Zofran/Decadron added Cycle 6 gemcitabine/Abraxane 05/15/2021 CT pancreas protocol 05/27/2021-unchanged 8 mm exophytic low-attenuation lesion at the posterior body of the pancreas 07/01/2021-distal pancreatectomy/splenectomy, 0.8 cm poorly differentiated adenocarcinoma, treatment response score-2, largest single foci of remaining tumor 0.2 cm, negative resection margins, 0/6 lymph nodes,ypT1bypNo, PanIN-1b Cycle 7 gemcitabine/Abraxane 08/14/2021 Cycle 8 gemcitabine/Abraxane 09/04/2021 Cycle 9 gemcitabine/Abraxane 09/18/2021 Cycle 10 gemcitabine/Abraxane 10/01/2021 Cycle 11 gemcitabine/Abraxane 10/16/2021 Cycle 12 gemcitabine/Abraxane 10/29/2021, Abraxane held secondary to neuropathy CT abdomen/pelvis 02/06/2022-acute interstitial edematous pancreatitis with small 1.6 cm acute peripancreatic fluid collection within the superior aspect of the pancreatic head; interval postoperative changes of distal pancreatectomy and splenectomy; stable benign left adrenal adenoma Diabetes Coronary artery disease Prostate cancer 2013-treated with radiation at [facility] Hypertension Sleep apnea 7.  Coronary artery bypass surgery 2004 8.  Hospital admission 03/04/2021-syncope, A. Fib-started on Eliquis 9.  Mild thrombocytopenia  following cycle 1  gemcitabine/Abraxane-resolved 10.  Anemia 11.  Admission 09/22/2021 with a right metatarsal ulcer and right leg cellulitis 12.  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  Renal insufficiency 14.  Right fifth toe callus/ulcer followed by podiatry and the wound clinic   Disposition: [name] remains in clinical remission from pancreas cancer.  He would like to keep the Port-A-Cath in place.  He will return for a Port-A-Cath flush in 8 weeks.  He will be scheduled for an office visit in 4 months.  He will continue follow-up with podiatry and the wound clinic for management of the right foot callus/ulcer.  Thornton Papas, MD  04/17/2023  11:55 AM

## 2023-04-18 LAB — CANCER ANTIGEN 19-9: CA 19-9: 19 U/mL (ref 0–35)

## 2023-04-19 ENCOUNTER — Encounter (HOSPITAL_BASED_OUTPATIENT_CLINIC_OR_DEPARTMENT_OTHER): Payer: Self-pay

## 2023-04-19 DIAGNOSIS — I10 Essential (primary) hypertension: Secondary | ICD-10-CM

## 2023-04-20 NOTE — Telephone Encounter (Signed)
Please advise 

## 2023-04-22 MED ORDER — VALSARTAN 160 MG PO TABS: 160.0000 mg | ORAL_TABLET | Freq: Every day | ORAL | 3 refills | Status: DC

## 2023-04-22 MED ORDER — HYDRALAZINE HCL 25 MG PO TABS: ORAL_TABLET | ORAL | 1 refills | Status: DC

## 2023-05-08 NOTE — Telephone Encounter (Signed)
Let's increase the amlodipine to 10 mg daily.  If he's done this before and had edema, then leave it at 5 mg and increase hydralazine to 50 mg tid.  Make sure he's monitoring sodium intake

## 2023-05-13 MED ORDER — HYDRALAZINE HCL 25 MG PO TABS: 50.0000 mg | ORAL_TABLET | Freq: Three times a day (TID) | ORAL | 3 refills | Status: DC

## 2023-05-13 NOTE — Telephone Encounter (Signed)
Called patient at his request via mychart message,    Patients wife states she is unsure what he wanted to ask. Reviewed the recommendations sent by [facility]. Prescription sent to the [facility] as requested.

## 2023-06-02 ENCOUNTER — Telehealth: Payer: Self-pay | Admitting: Family Medicine

## 2023-06-02 NOTE — Telephone Encounter (Signed)
Pt states the provider in referral [phone] had the incorrect address and is asking that this referral be resent.

## 2023-06-02 NOTE — Telephone Encounter (Signed)
Can you re-send please

## 2023-06-05 ENCOUNTER — Telehealth (HOSPITAL_BASED_OUTPATIENT_CLINIC_OR_DEPARTMENT_OTHER): Payer: Self-pay | Admitting: Cardiology

## 2023-06-05 NOTE — Telephone Encounter (Signed)
Pt c/o medication issue:  1. Name of Medication:   hydrALAZINE (APRESOLINE) 25 MG tablet   2. How are you currently taking this medication (dosage and times per day)?   Not taking as prescribed  3. Are you having a reaction (difficulty breathing--STAT)?   4. What is your medication issue?    RN Tripp at [employer] at [facility] stated patient is not taking this medication as prescribed.  Caller stated they will be following up with the patient regarding this medication.  Caller stated patient had been taking 2 tablets 3 times a day.

## 2023-06-05 NOTE — Telephone Encounter (Signed)
Blood pressure running upper 140's-lower 150's Readings are prior to medications  Home machine was about 16 points higher than office readings   After further talking with patient he has been having shortness of breath with exertion  This morning when walking he had arm pain and difficulty breathing Has had to use NTG couple of times recently  Scheduled patient visit for Monday with Dr Cristal Deer   If to continue Hydralazine 50 mg three times a day will update Rx at that time

## 2023-06-08 ENCOUNTER — Encounter (HOSPITAL_BASED_OUTPATIENT_CLINIC_OR_DEPARTMENT_OTHER): Payer: Self-pay | Admitting: Emergency Medicine

## 2023-06-08 ENCOUNTER — Other Ambulatory Visit: Payer: Self-pay

## 2023-06-08 ENCOUNTER — Ambulatory Visit (INDEPENDENT_AMBULATORY_CARE_PROVIDER_SITE_OTHER): Payer: Medicare Other | Admitting: Cardiology

## 2023-06-08 ENCOUNTER — Emergency Department (HOSPITAL_BASED_OUTPATIENT_CLINIC_OR_DEPARTMENT_OTHER): Payer: Medicare Other

## 2023-06-08 ENCOUNTER — Encounter (HOSPITAL_BASED_OUTPATIENT_CLINIC_OR_DEPARTMENT_OTHER): Payer: Self-pay | Admitting: Cardiology

## 2023-06-08 ENCOUNTER — Inpatient Hospital Stay (HOSPITAL_BASED_OUTPATIENT_CLINIC_OR_DEPARTMENT_OTHER)
Admission: EM | Admit: 2023-06-08 | Discharge: 2023-06-10 | DRG: 280 | Disposition: A | Discharge status: Home / Self Care | Payer: Medicare Other | Source: Ambulatory Visit | Attending: Cardiovascular Disease | Admitting: Cardiovascular Disease

## 2023-06-08 VITALS — BP 156/74 | HR 93 | Ht 71.0 in | Wt 264.0 lb

## 2023-06-08 DIAGNOSIS — K219 Gastro-esophageal reflux disease without esophagitis: Secondary | ICD-10-CM | POA: Diagnosis present

## 2023-06-08 DIAGNOSIS — I119 Hypertensive heart disease without heart failure: Secondary | ICD-10-CM | POA: Diagnosis present

## 2023-06-08 DIAGNOSIS — Z818 Family history of other mental and behavioral disorders: Secondary | ICD-10-CM | POA: Diagnosis not present

## 2023-06-08 DIAGNOSIS — Z8546 Personal history of malignant neoplasm of prostate: Secondary | ICD-10-CM

## 2023-06-08 DIAGNOSIS — Z951 Presence of aortocoronary bypass graft: Secondary | ICD-10-CM | POA: Diagnosis not present

## 2023-06-08 DIAGNOSIS — R0602 Shortness of breath: Secondary | ICD-10-CM

## 2023-06-08 DIAGNOSIS — N1831 Chronic kidney disease, stage 3a: Secondary | ICD-10-CM | POA: Diagnosis present

## 2023-06-08 DIAGNOSIS — Z794 Long term (current) use of insulin: Secondary | ICD-10-CM

## 2023-06-08 DIAGNOSIS — E118 Type 2 diabetes mellitus with unspecified complications: Secondary | ICD-10-CM

## 2023-06-08 DIAGNOSIS — I2510 Atherosclerotic heart disease of native coronary artery without angina pectoris: Secondary | ICD-10-CM | POA: Diagnosis present

## 2023-06-08 DIAGNOSIS — Z8507 Personal history of malignant neoplasm of pancreas: Secondary | ICD-10-CM

## 2023-06-08 DIAGNOSIS — E785 Hyperlipidemia, unspecified: Secondary | ICD-10-CM | POA: Diagnosis present

## 2023-06-08 DIAGNOSIS — I214 Non-ST elevation (NSTEMI) myocardial infarction: Principal | ICD-10-CM | POA: Diagnosis present

## 2023-06-08 DIAGNOSIS — I200 Unstable angina: Principal | ICD-10-CM | POA: Diagnosis present

## 2023-06-08 DIAGNOSIS — I700 Atherosclerosis of aorta: Secondary | ICD-10-CM | POA: Diagnosis not present

## 2023-06-08 DIAGNOSIS — Z79899 Other long term (current) drug therapy: Secondary | ICD-10-CM | POA: Diagnosis not present

## 2023-06-08 DIAGNOSIS — N183 Chronic kidney disease, stage 3 unspecified: Secondary | ICD-10-CM | POA: Diagnosis present

## 2023-06-08 DIAGNOSIS — Z87891 Personal history of nicotine dependence: Secondary | ICD-10-CM

## 2023-06-08 DIAGNOSIS — I480 Paroxysmal atrial fibrillation: Secondary | ICD-10-CM | POA: Diagnosis not present

## 2023-06-08 DIAGNOSIS — M1990 Unspecified osteoarthritis, unspecified site: Secondary | ICD-10-CM | POA: Diagnosis present

## 2023-06-08 DIAGNOSIS — I10 Essential (primary) hypertension: Secondary | ICD-10-CM | POA: Diagnosis not present

## 2023-06-08 DIAGNOSIS — I130 Hypertensive heart and chronic kidney disease with heart failure and stage 1 through stage 4 chronic kidney disease, or unspecified chronic kidney disease: Secondary | ICD-10-CM | POA: Diagnosis present

## 2023-06-08 DIAGNOSIS — N179 Acute kidney failure, unspecified: Secondary | ICD-10-CM | POA: Diagnosis present

## 2023-06-08 DIAGNOSIS — G4733 Obstructive sleep apnea (adult) (pediatric): Secondary | ICD-10-CM | POA: Diagnosis present

## 2023-06-08 DIAGNOSIS — Z7901 Long term (current) use of anticoagulants: Secondary | ICD-10-CM

## 2023-06-08 DIAGNOSIS — K760 Fatty (change of) liver, not elsewhere classified: Secondary | ICD-10-CM | POA: Diagnosis not present

## 2023-06-08 DIAGNOSIS — I252 Old myocardial infarction: Secondary | ICD-10-CM

## 2023-06-08 DIAGNOSIS — E1122 Type 2 diabetes mellitus with diabetic chronic kidney disease: Secondary | ICD-10-CM | POA: Diagnosis present

## 2023-06-08 DIAGNOSIS — E1151 Type 2 diabetes mellitus with diabetic peripheral angiopathy without gangrene: Secondary | ICD-10-CM | POA: Diagnosis present

## 2023-06-08 DIAGNOSIS — Z8249 Family history of ischemic heart disease and other diseases of the circulatory system: Secondary | ICD-10-CM

## 2023-06-08 DIAGNOSIS — I5033 Acute on chronic diastolic (congestive) heart failure: Secondary | ICD-10-CM | POA: Diagnosis present

## 2023-06-08 DIAGNOSIS — E1142 Type 2 diabetes mellitus with diabetic polyneuropathy: Secondary | ICD-10-CM | POA: Diagnosis present

## 2023-06-08 DIAGNOSIS — I4891 Unspecified atrial fibrillation: Secondary | ICD-10-CM | POA: Diagnosis present

## 2023-06-08 DIAGNOSIS — E876 Hypokalemia: Secondary | ICD-10-CM | POA: Diagnosis not present

## 2023-06-08 DIAGNOSIS — Z9861 Coronary angioplasty status: Secondary | ICD-10-CM

## 2023-06-08 DIAGNOSIS — Z9081 Acquired absence of spleen: Secondary | ICD-10-CM

## 2023-06-08 DIAGNOSIS — F32A Depression, unspecified: Secondary | ICD-10-CM | POA: Diagnosis present

## 2023-06-08 DIAGNOSIS — I739 Peripheral vascular disease, unspecified: Secondary | ICD-10-CM | POA: Diagnosis present

## 2023-06-08 DIAGNOSIS — Z7984 Long term (current) use of oral hypoglycemic drugs: Secondary | ICD-10-CM

## 2023-06-08 DIAGNOSIS — I25810 Atherosclerosis of coronary artery bypass graft(s) without angina pectoris: Secondary | ICD-10-CM | POA: Diagnosis present

## 2023-06-08 DIAGNOSIS — Z9049 Acquired absence of other specified parts of digestive tract: Secondary | ICD-10-CM

## 2023-06-08 DIAGNOSIS — E1129 Type 2 diabetes mellitus with other diabetic kidney complication: Secondary | ICD-10-CM | POA: Diagnosis present

## 2023-06-08 DIAGNOSIS — F4310 Post-traumatic stress disorder, unspecified: Secondary | ICD-10-CM | POA: Diagnosis present

## 2023-06-08 DIAGNOSIS — Z8701 Personal history of pneumonia (recurrent): Secondary | ICD-10-CM

## 2023-06-08 DIAGNOSIS — Z7982 Long term (current) use of aspirin: Secondary | ICD-10-CM

## 2023-06-08 LAB — CBC WITH DIFFERENTIAL/PLATELET
Abs Immature Granulocytes: 0.04 10*3/uL (ref 0.00–0.07)
Basophils Absolute: 0.1 10*3/uL (ref 0.0–0.1)
Basophils Relative: 0 %
Eosinophils Absolute: 0.2 10*3/uL (ref 0.0–0.5)
Eosinophils Relative: 2 %
HCT: 35.8 % — ABNORMAL LOW (ref 39.0–52.0)
Hemoglobin: 12.1 g/dL — ABNORMAL LOW (ref 13.0–17.0)
Immature Granulocytes: 0 %
Lymphocytes Relative: 26 %
Lymphs Abs: 3.4 10*3/uL (ref 0.7–4.0)
MCH: 32.5 pg (ref 26.0–34.0)
MCHC: 33.8 g/dL (ref 30.0–36.0)
MCV: 96.2 fL (ref 80.0–100.0)
Monocytes Absolute: 1.2 10*3/uL — ABNORMAL HIGH (ref 0.1–1.0)
Monocytes Relative: 9 %
Neutro Abs: 8.4 10*3/uL — ABNORMAL HIGH (ref 1.7–7.7)
Neutrophils Relative %: 63 %
Platelets: 258 10*3/uL (ref 150–400)
RBC: 3.72 MIL/uL — ABNORMAL LOW (ref 4.22–5.81)
RDW: 13.4 % (ref 11.5–15.5)
WBC: 13.3 10*3/uL — ABNORMAL HIGH (ref 4.0–10.5)
nRBC: 0 % (ref 0.0–0.2)

## 2023-06-08 LAB — BASIC METABOLIC PANEL
Anion gap: 10 (ref 5–15)
BUN: 32 mg/dL — ABNORMAL HIGH (ref 8–23)
CO2: 23 mmol/L (ref 22–32)
Calcium: 9.4 mg/dL (ref 8.9–10.3)
Chloride: 102 mmol/L (ref 98–111)
Creatinine, Ser: 1.43 mg/dL — ABNORMAL HIGH (ref 0.61–1.24)
GFR, Estimated: 50 mL/min — ABNORMAL LOW (ref >60)
Glucose, Bld: 326 mg/dL — ABNORMAL HIGH (ref 70–99)
Potassium: 3.8 mmol/L (ref 3.5–5.1)
Sodium: 135 mmol/L (ref 135–145)

## 2023-06-08 LAB — COMPREHENSIVE METABOLIC PANEL
ALT: 18 U/L (ref 0–44)
AST: 25 U/L (ref 15–41)
Albumin: 3.1 g/dL — ABNORMAL LOW (ref 3.5–5.0)
Alkaline Phosphatase: 87 U/L (ref 38–126)
Anion gap: 13 (ref 5–15)
BUN: 25 mg/dL — ABNORMAL HIGH (ref 8–23)
CO2: 23 mmol/L (ref 22–32)
Calcium: 8.9 mg/dL (ref 8.9–10.3)
Chloride: 103 mmol/L (ref 98–111)
Creatinine, Ser: 1.29 mg/dL — ABNORMAL HIGH (ref 0.61–1.24)
GFR, Estimated: 57 mL/min — ABNORMAL LOW (ref >60)
Glucose, Bld: 132 mg/dL — ABNORMAL HIGH (ref 70–99)
Potassium: 3.3 mmol/L — ABNORMAL LOW (ref 3.5–5.1)
Sodium: 139 mmol/L (ref 135–145)
Total Bilirubin: 0.5 mg/dL (ref 0.3–1.2)
Total Protein: 6.8 g/dL (ref 6.5–8.1)

## 2023-06-08 LAB — CBC
HCT: 35.1 % — ABNORMAL LOW (ref 39.0–52.0)
Hemoglobin: 11.8 g/dL — ABNORMAL LOW (ref 13.0–17.0)
MCH: 32.6 pg (ref 26.0–34.0)
MCHC: 33.6 g/dL (ref 30.0–36.0)
MCV: 97 fL (ref 80.0–100.0)
Platelets: 264 10*3/uL (ref 150–400)
RBC: 3.62 MIL/uL — ABNORMAL LOW (ref 4.22–5.81)
RDW: 13.4 % (ref 11.5–15.5)
WBC: 11.8 10*3/uL — ABNORMAL HIGH (ref 4.0–10.5)
nRBC: 0 % (ref 0.0–0.2)

## 2023-06-08 LAB — PROTIME-INR
INR: 1.3 — ABNORMAL HIGH (ref 0.8–1.2)
Prothrombin Time: 16.6 seconds — ABNORMAL HIGH (ref 11.4–15.2)

## 2023-06-08 LAB — MAGNESIUM: Magnesium: 1.9 mg/dL (ref 1.7–2.4)

## 2023-06-08 LAB — BRAIN NATRIURETIC PEPTIDE
B Natriuretic Peptide: 157.1 pg/mL — ABNORMAL HIGH (ref 0.0–100.0)
B Natriuretic Peptide: 347.4 pg/mL — ABNORMAL HIGH (ref 0.0–100.0)

## 2023-06-08 LAB — TROPONIN I (HIGH SENSITIVITY)
Troponin I (High Sensitivity): 32 ng/L — ABNORMAL HIGH (ref <18)
Troponin I (High Sensitivity): 43 ng/L — ABNORMAL HIGH (ref <18)
Troponin I (High Sensitivity): 170 ng/L (ref <18)

## 2023-06-08 LAB — TSH: TSH: 2.21 u[IU]/mL (ref 0.350–4.500)

## 2023-06-08 LAB — GLUCOSE, CAPILLARY: Glucose-Capillary: 123 mg/dL — ABNORMAL HIGH (ref 70–99)

## 2023-06-08 MED ORDER — INSULIN ASPART 100 UNIT/ML IJ SOLN: 5.0000 [IU] | Freq: Once | INTRAMUSCULAR | Status: DC

## 2023-06-08 MED ORDER — ATORVASTATIN CALCIUM 40 MG PO TABS
40.0000 mg | ORAL_TABLET | Freq: Every day | ORAL | Status: DC
  Administered 2023-06-08 – 2023-06-09 (×2): 40 mg via ORAL
  Filled 2023-06-08 (×2): qty 1

## 2023-06-08 MED ORDER — HEPARIN (PORCINE) 25000 UT/250ML-% IV SOLN
1500.0000 [IU]/h | INTRAVENOUS | Status: DC
  Administered 2023-06-08: 1500 [IU]/h via INTRAVENOUS
  Filled 2023-06-08 (×2): qty 250

## 2023-06-08 MED ORDER — AMLODIPINE BESYLATE 5 MG PO TABS
5.0000 mg | ORAL_TABLET | Freq: Every day | ORAL | Status: DC
  Administered 2023-06-09 – 2023-06-10 (×2): 5 mg via ORAL
  Filled 2023-06-08 (×2): qty 1

## 2023-06-08 MED ORDER — PANTOPRAZOLE SODIUM 40 MG PO TBEC
40.0000 mg | DELAYED_RELEASE_TABLET | Freq: Every day | ORAL | Status: DC
  Administered 2023-06-09 – 2023-06-10 (×2): 40 mg via ORAL
  Filled 2023-06-08 (×2): qty 1

## 2023-06-08 MED ORDER — CHLORHEXIDINE GLUCONATE CLOTH 2 % EX PADS
6.0000 | MEDICATED_PAD | Freq: Every day | CUTANEOUS | Status: DC
  Administered 2023-06-09 – 2023-06-10 (×2): 6 via TOPICAL

## 2023-06-08 MED ORDER — ASPIRIN 81 MG PO TBEC
81.0000 mg | DELAYED_RELEASE_TABLET | Freq: Every day | ORAL | Status: DC
  Administered 2023-06-09 – 2023-06-10 (×2): 81 mg via ORAL
  Filled 2023-06-08 (×2): qty 1

## 2023-06-08 MED ORDER — BENEFIBER DRINK MIX PO PACK: PACK | Freq: Every day | ORAL | Status: DC

## 2023-06-08 MED ORDER — ASPIRIN 81 MG PO TBEC: 81.0000 mg | DELAYED_RELEASE_TABLET | Freq: Every day | ORAL | Status: DC

## 2023-06-08 MED ORDER — TAMSULOSIN HCL 0.4 MG PO CAPS
0.8000 mg | ORAL_CAPSULE | Freq: Every day | ORAL | Status: DC
  Administered 2023-06-08 – 2023-06-09 (×2): 0.8 mg via ORAL
  Filled 2023-06-08 (×2): qty 2

## 2023-06-08 MED ORDER — INSULIN ASPART 100 UNIT/ML IJ SOLN: 0.0000 [IU] | Freq: Three times a day (TID) | INTRAMUSCULAR | Status: DC

## 2023-06-08 MED ORDER — FLUOXETINE HCL 20 MG PO CAPS
40.0000 mg | ORAL_CAPSULE | Freq: Every day | ORAL | Status: DC
  Administered 2023-06-09 – 2023-06-10 (×2): 40 mg via ORAL
  Filled 2023-06-08 (×2): qty 2

## 2023-06-08 MED ORDER — NITROGLYCERIN 0.4 MG SL SUBL: 0.4000 mg | SUBLINGUAL_TABLET | SUBLINGUAL | Status: DC | PRN

## 2023-06-08 MED ORDER — LORATADINE 10 MG PO TABS: 10.0000 mg | ORAL_TABLET | Freq: Every day | ORAL | Status: DC | PRN

## 2023-06-08 MED ORDER — CYCLOSPORINE 0.05 % OP EMUL: 1.0000 [drp] | Freq: Two times a day (BID) | OPHTHALMIC | Status: DC | PRN

## 2023-06-08 MED ORDER — ASPIRIN 325 MG PO TABS
325.0000 mg | ORAL_TABLET | Freq: Once | ORAL | Status: AC
  Administered 2023-06-08: 325 mg via ORAL
  Filled 2023-06-08: qty 1

## 2023-06-08 MED ORDER — ONDANSETRON HCL 4 MG/2ML IJ SOLN: 4.0000 mg | Freq: Four times a day (QID) | INTRAMUSCULAR | Status: DC | PRN

## 2023-06-08 MED ORDER — FLUTICASONE PROPIONATE 50 MCG/ACT NA SUSP
1.0000 | Freq: Every day | NASAL | Status: DC
  Filled 2023-06-08: qty 16

## 2023-06-08 MED ORDER — TRAZODONE HCL 100 MG PO TABS
100.0000 mg | ORAL_TABLET | Freq: Every day | ORAL | Status: DC
  Administered 2023-06-08 – 2023-06-09 (×2): 100 mg via ORAL
  Filled 2023-06-08 (×2): qty 1

## 2023-06-08 MED ORDER — ACETAMINOPHEN 325 MG PO TABS
650.0000 mg | ORAL_TABLET | ORAL | Status: DC | PRN
  Administered 2023-06-09: 650 mg via ORAL
  Filled 2023-06-08 (×2): qty 2

## 2023-06-08 MED ORDER — METOPROLOL TARTRATE 12.5 MG HALF TABLET
12.5000 mg | ORAL_TABLET | Freq: Two times a day (BID) | ORAL | Status: DC
  Administered 2023-06-08 – 2023-06-10 (×4): 12.5 mg via ORAL
  Filled 2023-06-08 (×4): qty 1

## 2023-06-08 NOTE — Progress Notes (Signed)
Spoke with Amy, RN regarding the use of patient's implanted port.  As 3E is responsible for the central lines on the unit, IV team only does the access and de access of port needle when it is needed otherwise all care, lab draw and flushing is done by unit RN.  Amy verbalized understanding.

## 2023-06-08 NOTE — H&P (Signed)
Cardiology Admission History and Physical   Patient ID: [name] MRN: [id number]; DOB: 1946/03/04   Admission date: 06/08/2023  PCP:  [place], [name] G, MD   [facility] Providers Cardiologist:  Jodelle Red, MD        Chief Complaint:  Angina  Patient Profile:   [name] is a 77 y.o. male with a history of paroxysmal atrial fibrillation, coronary artery disease with previous myocardial infarction and history of CABG, hypertension, hyperlipidemia, CKD 3A, diabetes, obstructive sleep apnea who is being seen 06/08/2023 for the evaluation of angina.  History of Present Illness:   [name] reports a 2 to 3-week history of progressive chest pain treated at home with sublingual nitro.  Today he reports presenting to his cardiology appointment and developing dyspnea and pressure that radiated to the upper arms.  He subsequently used 2 sublingual nitro with cessation of his symptoms.  He was then directed to present to the emergency department for further evaluation.  He reports no orthopnea, PND, or increased lower extremity edema. He does report a 10 pound weight gain.  He does sleep on an incline for perceived benefit to his lower extremity edema but unrelated to difficulty with breathing.   Past Medical History:  Diagnosis Date   A-fib (HCC) 03/04/2021   Anemia    Arthritis    Cancer (HCC)    prostate   Chronic kidney disease    blood in urine    Constipation    Coronary artery disease    Depression    Diabetes mellitus without complication (HCC)    Difficult intubation    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 it was hard to get the tube down his throat   Dyspnea    Family history of breast cancer    Fatty liver    Fatty liver    Frequent headaches    GERD (gastroesophageal reflux disease)    History of chicken pox    History of fainting spells of unknown cause    History of prostate cancer    Hyperlipidemia    Hypertension    Myocardial  infarction [facility])    Peripheral neuropathy    Pneumonia    Prostate cancer (HCC)    PTSD (post-traumatic stress disorder)    Sleep apnea    uses Cpap    Past Surgical History:  Procedure Laterality Date   APPENDECTOMY     BIOPSY  01/15/2021   Procedure: BIOPSY;  Surgeon: Lemar Lofty., MD;  Location: [facility] ENDOSCOPY;  Service: Gastroenterology;;   CARDIAC CATHETERIZATION  06/26/2017   CARDIAC SURGERY     Triple Bypass   CHOLECYSTECTOMY  2010   COLONOSCOPY     CORONARY ARTERY BYPASS GRAFT  2004   ESOPHAGOGASTRODUODENOSCOPY (EGD) WITH PROPOFOL N/A 01/15/2021   Procedure: ESOPHAGOGASTRODUODENOSCOPY (EGD) WITH PROPOFOL;  Surgeon: Lemar Lofty., MD;  Location: [facility] ENDOSCOPY;  Service: Gastroenterology;  Laterality: N/A;   EUS N/A 01/15/2021   Procedure: UPPER ENDOSCOPIC ULTRASOUND (EUS) RADIAL;  Surgeon: Lemar Lofty., MD;  Location: [facility] ENDOSCOPY;  Service: Gastroenterology;  Laterality: N/A;   EYE SURGERY Bilateral    cataract removal   FINE NEEDLE ASPIRATION  01/15/2021   Procedure: FINE NEEDLE ASPIRATION (FNA) LINEAR;  Surgeon: Lemar Lofty., MD;  Location: [facility] ENDOSCOPY;  Service: Gastroenterology;;   LAPAROSCOPY N/A 07/01/2021   Procedure: STAGING LAPAROSCOPY;  Surgeon: Fritzi Mandes, MD;  Location: [facility] OR;  Service: General;  Laterality: N/A;   PORTACATH PLACEMENT Right 02/21/2021  Procedure: INSERTION PORT-A-CATH;  Surgeon: Fritzi Mandes, MD;  Location: [facility] OR;  Service: General;  Laterality: Right;   SPLENECTOMY, 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   Procedure: SPLENECTOMY;  Surgeon: Fritzi Mandes, MD;  Location: [facility] OR;  Service: General;  Laterality: N/A;   TONSILLECTOMY  1958     Medications Prior to Admission: Prior to Admission medications   Medication Sig Start Date End Date Taking? Authorizing Provider  acetaminophen (TYLENOL) 500 MG tablet Take 1,000 mg by mouth 2 (two) times daily.    [provider]  amLODipine (NORVASC) 5 MG tablet  Take 1 tablet (5 mg total) by mouth daily. 02/10/22   Alver Sorrow, NP  apixaban (ELIQUIS) 5 MG TABS tablet Take 5 mg by mouth 2 (two) times daily.    [provider]  atorvastatin (LIPITOR) 80 MG tablet Take 40 mg by mouth at bedtime. 09/22/19   [provider]  calcium-vitamin D (OSCAL WITH D) 500-200 MG-UNIT tablet Take 1 tablet by mouth 2 (two) times daily.    [provider]  ciclopirox (PENLAC) 8 % solution Apply topically at bedtime. Apply over nail and surrounding skin. Apply daily over previous coat. After seven (7) days, may remove with alcohol and continue cycle. 12/31/22   McDonald, Rachelle Hora, DPM  clotrimazole-betamethasone (LOTRISONE) cream Apply 1 application topically daily as needed. 11/13/21   [place], [name] G, MD  CRANBERRY PO Take by mouth.    [provider]  cycloSPORINE (RESTASIS) 0.05 % ophthalmic emulsion Place 1 drop into both eyes 2 (two) times daily as needed (dry eyes).    [provider]  ferrous sulfate 325 (65 FE) MG tablet Take 1 tablet (325 mg total) by mouth daily with breakfast. 11/13/21   [place], [name] G, MD  FLUoxetine (PROZAC) 20 MG capsule Take 40 mg by mouth daily.    [provider]  fluticasone (FLONASE) 50 MCG/ACT nasal spray Place 1 spray into both nostrils daily.    [provider]  furosemide (LASIX) 20 MG tablet Take 1 tablet (20 mg total) by mouth daily. 01/29/22   Swinyer, Zachary George, NP  glucose blood (ONETOUCH VERIO) test strip Use to test blood sugars 1-2 times daily. 07/26/21   [place], [name] G, MD  hydrALAZINE (APRESOLINE) 50 MG tablet Take 50 mg by mouth 3 (three) times daily.    [provider]  insulin glargine (LANTUS) 100 UNIT/ML injection Inject 0.45 mLs (45 Units total) into the skin daily. 04/08/22   [place], [name] G, MD  insulin lispro (HUMALOG) 100 UNIT/ML injection Per sliding scale. 07/07/22   [place], [name] G, MD  lidocaine-prilocaine (EMLA) cream Apply 1 application   topically as needed.    [provider]  loratadine (CLARITIN) 10 MG tablet Take 10 mg by mouth daily as needed.    [provider]  Multiple Vitamin (MULTI-VITAMINS) TABS Take 1 tablet by mouth daily.    [provider]  nitroGLYCERIN (NITROSTAT) 0.3 MG SL tablet Place 0.3 mg under the tongue every 5 (five) minutes as needed for chest pain.    [provider]  nystatin powder Apply 1 application topically 3 (three) times daily. 11/13/21   [place], [name] G, MD  pantoprazole (PROTONIX) 40 MG tablet Take 1 tablet (40 mg total) by mouth daily. 07/15/21   [place], [name] G, MD  Probiotic Product (PROBIOTIC ADVANCED PO) Take 1 capsule by mouth at bedtime.    [provider]  tamsulosin (FLOMAX) 0.4 MG CAPS capsule Take 0.8 mg  by mouth at bedtime. 03/27/21   [provider]  traZODone (DESYREL) 100 MG tablet Take 100 mg by mouth at bedtime.    [provider]  valsartan (DIOVAN) 160 MG tablet Take 1 tablet (160 mg total) by mouth daily. 04/22/23   Jodelle Red, MD  vitamin B-12 (CYANOCOBALAMIN) 1000 MCG tablet Take 1,000 mcg by mouth daily.    [provider]  Wheat Dextrin (BENEFIBER DRINK MIX PO) Take by mouth daily.    [provider]     Allergies:    Allergies  Allergen Reactions   Keflex [Cephalexin] Other (See Comments)    Hallucinations?   Furosemide Other (See Comments)    Unknown   Lisinopril Other (See Comments)    Unknown   Terazosin Other (See Comments)    Unknown    Social History:   Social History   Socioeconomic History   Marital status: Married    Spouse name: Not on file   Number of children: 2   Years of education: Not on file   Highest education level: Not on file  Occupational History   Occupation: retired  Tobacco Use   Smoking status: Former    Types: Cigarettes    Quit date: 12/14/1975    Years since quitting: 47.5   Smokeless tobacco: Never  Vaping Use   Vaping Use: Not  on file  Substance and Sexual Activity   Alcohol use: Not Currently   Drug use: Never   Sexual activity: Not Currently  Other Topics Concern   Not on file  Social History Narrative   ** Merged History Encounter **       Social Determinants of Health   Financial Resource Strain: Low Risk  (12/16/2022)   Overall Financial Resource Strain (CARDIA)    Difficulty of Paying Living Expenses: Not hard at all  Food Insecurity: No Food Insecurity (12/16/2022)   Hunger Vital Sign    Worried About Running Out of Food in the Last Year: Never true    Ran Out of Food in the Last Year: Never true  Transportation Needs: No Transportation Needs (12/16/2022)   PRAPARE - Administrator, Civil Service (Medical): No    Lack of Transportation (Non-Medical): No  Physical Activity: Insufficiently Active (12/16/2022)   Exercise Vital Sign    Days of Exercise per Week: 2 days    Minutes of Exercise per Session: 60 min  Stress: No Stress Concern Present (12/16/2022)   [employer] of Occupational Health - Occupational Stress Questionnaire    Feeling of Stress : Not at all  Social Connections: Socially Integrated (12/16/2022)   Social Connection and Isolation Panel [NHANES]    Frequency of Communication with Friends and Family: More than three times a week    Frequency of Social Gatherings with Friends and Family: More than three times a week    Attends Religious Services: More than 4 times per year    Active Member of [employer] or Organizations: Yes    Attends Engineer, structural: More than 4 times per year    Marital Status: Married  Catering manager Violence: Not At Risk (12/16/2022)   Humiliation, Afraid, Rape, and Kick questionnaire    Fear of Current or Ex-Partner: No    Emotionally Abused: No    Physically Abused: No    Sexually Abused: No    Family History:   The patient's family history includes Alcohol abuse in his brother; Arthritis in his mother and sister; Breast cancer  in his  sister; COPD in his brother; Cancer in his brother and sister; Depression in his daughter; Heart attack in his brother, brother, and mother; Heart disease in his brother, brother, and mother; Hyperlipidemia in his mother; Hypertension in his mother; Learning disabilities in his brother.    ROS:  Please see the history of present illness.  All other ROS reviewed and negative.     Physical Exam/Data:   Vitals:   06/08/23 1724 06/08/23 1756 06/08/23 1800 06/08/23 1950  BP:  (!) 140/73 127/88 (!) 165/65  Pulse: 89 (!) 30 (!) 38   Resp: 16 16  18   Temp:  97.9 F (36.6 C)  97.7 F (36.5 C)  TempSrc:      SpO2: 98% 98% 95% 95%  Weight:      Height:       No intake or output data in the 24 hours ending 06/08/23 2240    06/08/2023   10:25 AM 06/08/2023    9:30 AM 04/17/2023   11:29 AM  Last 3 Weights  Weight (lbs) 264 lb 264 lb 259 lb  Weight (kg) 119.75 kg 119.75 kg 117.482 kg     Body mass index is 36.82 kg/m.  General:  Well nourished, well developed, in no acute distress HEENT: normal Neck: no JVD Vascular: No carotid bruits; Distal pulses 2+ bilaterally   Cardiac:  normal S1, S2; RRR; no murmur  Lungs:  clear to auscultation bilaterally, no wheezing, rhonchi or rales  Abd: soft, nontender, no hepatomegaly  Ext: 1+ B/L LE edema Musculoskeletal:  No deformities, BUE and BLE strength normal and equal Skin: warm and dry  Neuro:  CNs 2-12 intact, no focal abnormalities noted Psych:  Normal affect    EKG:  The ECG that was done and was personally reviewed and demonstrates normal sinus rhythm with evidence of previous anterior infarction  Relevant CV Studies: Echo 03/04/21   1. Left ventricular ejection fraction, by estimation, is 60 to 65%. The  left ventricle has normal function. The left ventricle has no regional  wall motion abnormalities. Left ventricular diastolic parameters are  indeterminate.   2. Right ventricular systolic function is normal. The right ventricular   size is normal. There is normal pulmonary artery systolic pressure.   3. The mitral valve is normal in structure. No evidence of mitral valve  regurgitation.   4. The aortic valve is normal in structure. Aortic valve regurgitation is  not visualized.   5. The inferior vena cava is normal in size with greater than 50%  respiratory variability, suggesting right atrial pressure of 3 mmHg.   Cath 06/26/2017  LMCA: 25-30% occlusion at the bifurcation  LAD: 90% occlusion in the proximal segment.  LCX: Nondominant with total occlusion in the proximal segment. Ramus artery has a 75% occlusion in the proximal segment.  RCA: Dominant and totally occluded in the proximal segment.  GRAFTS: 3 grafts identified. LIMA graft to the LAD is patent and the distal LAD fills the distal circumflex artery. Vein graft to the ramus artery has a 40-50% occlusion in the proximal segment immediately distal to the ostium. Vein graft to the PDA is patent.  Left Ventriculogram: Overall normal left ventricular systolic function but with minimal inferior hypokinesis, ejection fraction 60%. LVEDP 18 mmHg. No evidence of mitral regurgitation or aortic stenosis.   Laboratory Data:  High Sensitivity Troponin:   Recent Labs  Lab 06/08/23 1021 06/08/23 1220  TROPONINIHS 32* 43*      Chemistry Recent Labs  Lab 06/08/23 1021  NA 135  K 3.8  CL 102  CO2 23  GLUCOSE 326*  BUN 32*  CREATININE 1.43*  CALCIUM 9.4  GFRNONAA 50*  ANIONGAP 10    No results for input(s): "PROT", "ALBUMIN", "AST", "ALT", "ALKPHOS", "BILITOT" in the last 168 hours. Lipids No results for input(s): "CHOL", "TRIG", "HDL", "LABVLDL", "LDLCALC", "CHOLHDL" in the last 168 hours. Hematology Recent Labs  Lab 06/08/23 1021  WBC 11.8*  RBC 3.62*  HGB 11.8*  HCT 35.1*  MCV 97.0  MCH 32.6  MCHC 33.6  RDW 13.4  PLT 264   Thyroid No results for input(s): "TSH", "FREET4" in the last 168 hours. BNP Recent Labs  Lab 06/08/23 1021   BNP 157.1*    DDimer No results for input(s): "DDIMER" in the last 168 hours.   Radiology/Studies:  DG Chest Port 1 View  Result Date: 06/08/2023 CLINICAL DATA:  SOB (shortness of breath) EXAM: PORTABLE CHEST 1 VIEW COMPARISON:  Chest x-ray February 06, 2022. FINDINGS: Right IJ approach Port-A-Cath with the tip projecting near the superior cavoatrial junction. Enlarged cardiac silhouette, likely accentuated by technique. Median sternotomy. Pulmonary vascular congestion. No visible pleural effusions or pneumothorax. No consolidation. IMPRESSION: Cardiomegaly and pulmonary vascular congestion without overt pulmonary edema. Electronically Signed   By: Feliberto Harts M.D.   On: 06/08/2023 10:37     Assessment and Plan:   NSTEMI - cardiac chest pain with mildly elevated troponin in a patient with remote history of CABG.  Would benefit from repeat coronary angiography.  If no intervenable lesions, can uptitrate antianginal medical therapy.  Last cath 2018 with 3 patent grafts (see above for full report from 2018). - Heparin - Daily aspirin - Echocardiogram  Acute on chronic HFpEF - patient without overt heart failure symptoms although does report 10 pound weight gain with 1+ lower extremity edema and mildly elevated BNP.  Recommend evaluating LVEDP during coronary angiography. - Furosemide IV 40 mg once in AM (tolerates daily dose per wife at home) - Discharge on SGLT2 if not cost prohibitive  CAD with history of CABG and PCI - Continue daily aspirin - Continue home atorvastatin  Paroxysmal atrial fibrillation - hold apixaban Essential hypertension - continue amlodipine, hold valsartan and hydralazine CKD3a - at baseline of 1.4 T2DM - SSI   Risk Assessment/Risk Scores:    TIMI Risk Score for Unstable Angina or Non-ST Elevation MI:   The patient's TIMI risk score is 5, which indicates a 26% risk of all cause mortality, new or recurrent myocardial infarction or need for urgent  revascularization in the next 14 days.  New York Heart Association (NYHA) Functional Class NYHA Class III  CHA2DS2-VASc Score =   5  This indicates a  % annual risk of stroke. The patient's score is based upon:      Severity of Illness: The appropriate patient status for this patient is INPATIENT. Inpatient status is judged to be reasonable and necessary in order to provide the required intensity of service to ensure the patient's safety. The patient's presenting symptoms, physical exam findings, and initial radiographic and laboratory data in the context of their chronic comorbidities is felt to place them at high risk for further clinical deterioration. Furthermore, it is not anticipated that the patient will be medically stable for discharge from the hospital within 2 midnights of admission.   * I certify that at the point of admission it is my clinical judgment that the patient will require inpatient hospital care spanning beyond  2 midnights from the point of admission due to high intensity of service, high risk for further deterioration and high frequency of surveillance required.*   For questions or updates, please contact [facility] Please consult [email or web address] for contact info under     Signed, Roderic Palau, MD  06/08/2023 10:40 PM

## 2023-06-08 NOTE — Progress Notes (Signed)
ANTICOAGULATION CONSULT NOTE - Initial Consult  Pharmacy Consult for Heparin Indication: chest pain/ACS  Allergies  Allergen Reactions   Keflex [Cephalexin] Other (See Comments)    Hallucinations?   Furosemide Other (See Comments)    Unknown   Lisinopril Other (See Comments)    Unknown   Terazosin Other (See Comments)    Unknown    Patient Measurements: Height: 5\' 11"  (180.3 cm) Weight: 119.7 kg (264 lb) IBW/kg (Calculated) : 75.3 Heparin Dosing Weight: 102 kg  Vital Signs: Temp: 97.7 F (36.5 C) (06/24 1950) Temp Source: Oral (06/24 1359) BP: 165/65 (06/24 1950) Pulse Rate: 38 (06/24 1800)  Labs: Recent Labs    06/08/23 1021 06/08/23 1220  HGB 11.8*  --   HCT 35.1*  --   PLT 264  --   CREATININE 1.43*  --   TROPONINIHS 32* 43*    Estimated Creatinine Clearance: 56.1 mL/min (A) (by C-G formula based on SCr of 1.43 mg/dL (H)).   Medical History: Past Medical History:  Diagnosis Date   A-fib (HCC) 03/04/2021   Anemia    Arthritis    Cancer (HCC)    prostate   Chronic kidney disease    blood in urine    Constipation    Coronary artery disease    Depression    Diabetes mellitus without complication (HCC)    Difficult intubation    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 it was hard to get the tube down his throat   Dyspnea    Family history of breast cancer    Fatty liver    Fatty liver    Frequent headaches    GERD (gastroesophageal reflux disease)    History of chicken pox    History of fainting spells of unknown cause    History of prostate cancer    Hyperlipidemia    Hypertension    Myocardial infarction (HCC)    Peripheral neuropathy    Pneumonia    Prostate cancer (HCC)    PTSD (post-traumatic stress disorder)    Sleep apnea    uses Cpap    Medications:  Medications Prior to Admission  Medication Sig Dispense Refill Last Dose   acetaminophen (TYLENOL) 500 MG tablet Take 1,000 mg by mouth 2 (two) times daily.      amLODipine (NORVASC) 5 MG tablet  Take 1 tablet (5 mg total) by mouth daily. 90 tablet 3    apixaban (ELIQUIS) 5 MG TABS tablet Take 5 mg by mouth 2 (two) times daily.      atorvastatin (LIPITOR) 80 MG tablet Take 40 mg by mouth at bedtime.      calcium-vitamin D (OSCAL WITH D) 500-200 MG-UNIT tablet Take 1 tablet by mouth 2 (two) times daily.      ciclopirox (PENLAC) 8 % solution Apply topically at bedtime. Apply over nail and surrounding skin. Apply daily over previous coat. After seven (7) days, may remove with alcohol and continue cycle. 6.6 mL 0    clotrimazole-betamethasone (LOTRISONE) cream Apply 1 application topically daily as needed. 45 g 2    CRANBERRY PO Take by mouth.      cycloSPORINE (RESTASIS) 0.05 % ophthalmic emulsion Place 1 drop into both eyes 2 (two) times daily as needed (dry eyes).      ferrous sulfate 325 (65 FE) MG tablet Take 1 tablet (325 mg total) by mouth daily with breakfast. 90 tablet 2    FLUoxetine (PROZAC) 20 MG capsule Take 40 mg by mouth daily.  fluticasone (FLONASE) 50 MCG/ACT nasal spray Place 1 spray into both nostrils daily.      furosemide (LASIX) 20 MG tablet Take 1 tablet (20 mg total) by mouth daily. 90 tablet 3    glucose blood (ONETOUCH VERIO) test strip Use to test blood sugars 1-2 times daily. 200 each 12    hydrALAZINE (APRESOLINE) 50 MG tablet Take 50 mg by mouth 3 (three) times daily.      insulin glargine (LANTUS) 100 UNIT/ML injection Inject 0.45 mLs (45 Units total) into the skin daily. 10 mL 3    insulin lispro (HUMALOG) 100 UNIT/ML injection Per sliding scale. 10 mL 11    lidocaine-prilocaine (EMLA) cream Apply 1 application  topically as needed.      loratadine (CLARITIN) 10 MG tablet Take 10 mg by mouth daily as needed.      Multiple Vitamin (MULTI-VITAMINS) TABS Take 1 tablet by mouth daily.      nitroGLYCERIN (NITROSTAT) 0.3 MG SL tablet Place 0.3 mg under the tongue every 5 (five) minutes as needed for chest pain.      nystatin powder Apply 1 application topically 3  (three) times daily. 15 g 2    pantoprazole (PROTONIX) 40 MG tablet Take 1 tablet (40 mg total) by mouth daily. 90 tablet 3    Probiotic Product (PROBIOTIC ADVANCED PO) Take 1 capsule by mouth at bedtime.      tamsulosin (FLOMAX) 0.4 MG CAPS capsule Take 0.8 mg by mouth at bedtime.      traZODone (DESYREL) 100 MG tablet Take 100 mg by mouth at bedtime.      valsartan (DIOVAN) 160 MG tablet Take 1 tablet (160 mg total) by mouth daily. 90 tablet 3    vitamin B-12 (CYANOCOBALAMIN) 1000 MCG tablet Take 1,000 mcg by mouth daily.      Wheat Dextrin (BENEFIBER DRINK MIX PO) Take by mouth daily.       Assessment: 77 y.o. M presents with SOB, CP. To begin heparin for ACS. Pt on apixaban pta for afib. Last dose 6/24 a.m. (pt unsure of exact time but in the morning). CBC ok on admission. Apixaban will be affecting heparin level so will utilize aPTT for monitoring until levels correlate.  Goal of Therapy:  Heparin level 0.3-0.7 units/ml aPTT 66-102 seconds Monitor platelets by anticoagulation protocol: Yes   Plan:  Start heparin gtt at 1500 units/hr. No bolus with recent apixaban use. Will f/u heparin level and aPTT in 8 hours Daily heparin level, aPTT, and CBC  Christoper Fabian, PharmD, BCPS Please see amion for complete clinical pharmacist phone list 06/08/2023,8:50 PM

## 2023-06-08 NOTE — ED Notes (Signed)
Pt assisted to restroom in wheelchair, states he cannot use urinal.

## 2023-06-08 NOTE — ED Notes (Signed)
ED TO INPATIENT HANDOFF REPORT  ED Nurse Name and Phone #:    S Name/Age/Gender [name] 77 y.o. male Room/Bed: DB013/DB013  Code Status   Code Status: Prior  Home/SNF/Other Home Patient oriented to: self, time, situation, place Is this baseline? Yes   Triage Complete: Triage complete  Chief Complaint Unstable angina [facility]) [I20.0]  Triage Note Pt arrives to ED from his Cardiology appt with c/o SOB. He notes SOB over last several weeks that has worsened today. He notes bilateral arm pressure for at least two weeks. He states he has had to take his nitro several times recently for CP. Pt tachypneic in triage. 10lb weight gain last month.    Allergies Allergies  Allergen Reactions   Keflex [Cephalexin] Other (See Comments)    Hallucinations?   Furosemide Other (See Comments)    Unknown   Lisinopril Other (See Comments)    Unknown   Terazosin Other (See Comments)    Unknown    Level of Care/Admitting Diagnosis ED Disposition     ED Disposition  Admit   Condition  --   Comment  Hospital Area: [facility] [[id number]]  Level of Care: Telemetry Cardiac [[id number]]  May admit patient to [facility] or [facility] if equivalent level of care is available:: No  Interfacility transfer: Yes  Covid Evaluation: Asymptomatic - no recent exposure (last 10 days) testing not required  Diagnosis: Unstable angina [facility]) [[id number]]  Admitting Physician: Chrystie Nose [phone]  Attending Physician: Chrystie Nose [phone]  Certification:: I certify this patient will need inpatient services for at least 2 midnights  Estimated Length of Stay: 3          B Medical/Surgery History Past Medical History:  Diagnosis Date   A-fib (HCC) 03/04/2021   Anemia    Arthritis    Cancer (HCC)    prostate   Chronic kidney disease    blood in urine    Constipation    Coronary artery disease    Depression    Diabetes mellitus without complication (HCC)     Difficult intubation    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 it was hard to get the tube down his throat   Dyspnea    Family history of breast cancer    Fatty liver    Fatty liver    Frequent headaches    GERD (gastroesophageal reflux disease)    History of chicken pox    History of fainting spells of unknown cause    History of prostate cancer    Hyperlipidemia    Hypertension    Myocardial infarction [facility])    Peripheral neuropathy    Pneumonia    Prostate cancer (HCC)    PTSD (post-traumatic stress disorder)    Sleep apnea    uses Cpap   Past Surgical History:  Procedure Laterality Date   APPENDECTOMY     BIOPSY  01/15/2021   Procedure: BIOPSY;  Surgeon: Lemar Lofty., MD;  Location: [facility] ENDOSCOPY;  Service: Gastroenterology;;   CARDIAC CATHETERIZATION  06/26/2017   CARDIAC SURGERY     Triple Bypass   CHOLECYSTECTOMY  2010   COLONOSCOPY     CORONARY ARTERY BYPASS GRAFT  2004   ESOPHAGOGASTRODUODENOSCOPY (EGD) WITH PROPOFOL N/A 01/15/2021   Procedure: ESOPHAGOGASTRODUODENOSCOPY (EGD) WITH PROPOFOL;  Surgeon: Lemar Lofty., MD;  Location: [facility] ENDOSCOPY;  Service: Gastroenterology;  Laterality: N/A;   EUS N/A 01/15/2021   Procedure: UPPER ENDOSCOPIC ULTRASOUND (EUS) RADIAL;  Surgeon: Corliss Parish  Montez Hageman., MD;  Location: [facility] ENDOSCOPY;  Service: Gastroenterology;  Laterality: N/A;   EYE SURGERY Bilateral    cataract removal   FINE NEEDLE ASPIRATION  01/15/2021   Procedure: FINE NEEDLE ASPIRATION (FNA) LINEAR;  Surgeon: Lemar Lofty., MD;  Location: [facility] ENDOSCOPY;  Service: Gastroenterology;;   LAPAROSCOPY N/A 07/01/2021   Procedure: STAGING LAPAROSCOPY;  Surgeon: Fritzi Mandes, MD;  Location[facility] OR;  Service: General;  Laterality: N/A;   PORTACATH PLACEMENT Right 02/21/2021   Procedure: INSERTION PORT-A-CATH;  Surgeon: Fritzi Mandes, MD;  Location: [facility] OR;  Service: General;  Laterality: Right;   SPLENECTOMY, 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   Procedure: SPLENECTOMY;   Surgeon: Fritzi Mandes, MD;  Location: [facility] OR;  Service: General;  Laterality: N/A;   TONSILLECTOMY  1958     A IV Location/Drains/Wounds Patient Lines/Drains/Airways Status     Active Line/Drains/Airways     Name Placement date Placement time Site Days   Implanted Port 02/21/21 Right Chest 02/21/21  1350  Chest  837            Intake/Output Last 24 hours No intake or output data in the 24 hours ending 06/08/23 1643  Labs/Imaging Results for orders placed or performed during the hospital encounter of 06/08/23 (from the past 48 hour(s))  Basic metabolic panel     Status: Abnormal   Collection Time: 06/08/23 10:21 AM  Result Value Ref Range   Sodium 135 135 - 145 mmol/L   Potassium 3.8 3.5 - 5.1 mmol/L   Chloride 102 98 - 111 mmol/L   CO2 23 22 - 32 mmol/L   Glucose, Bld 326 (H) 70 - 99 mg/dL    Comment: Glucose reference range applies only to samples taken after fasting for at least 8 hours.   BUN 32 (H) 8 - 23 mg/dL   Creatinine, Ser 1.61 (H) 0.61 - 1.24 mg/dL   Calcium 9.4 8.9 - 09.6 mg/dL   GFR, Estimated 50 (L) >60 mL/min    Comment: (NOTE) Calculated using the CKD-EPI Creatinine Equation (2021)    Anion gap 10 5 - 15    Comment: Performed at [facility], [address], [place], [place] [address]  CBC     Status: Abnormal   Collection Time: 06/08/23 10:21 AM  Result Value Ref Range   WBC 11.8 (H) 4.0 - 10.5 K/uL   RBC 3.62 (L) 4.22 - 5.81 MIL/uL   Hemoglobin 11.8 (L) 13.0 - 17.0 g/dL   HCT 98.1 (L) 19.1 - 47.8 %   MCV 97.0 80.0 - 100.0 fL   MCH 32.6 26.0 - 34.0 pg   MCHC 33.6 30.0 - 36.0 g/dL   RDW 29.5 62.1 - 30.8 %   Platelets 264 150 - 400 K/uL   nRBC 0.0 0.0 - 0.2 %    Comment: Performed at [facility], [address], [place], [place] [address]  Troponin I (High Sensitivity)     Status: Abnormal   Collection Time: 06/08/23 10:21 AM  Result Value Ref Range   Troponin I (High Sensitivity) 32 (H) <18 ng/L     Comment: (NOTE) Elevated high sensitivity troponin I (hsTnI) values and significant  changes across serial measurements may suggest ACS but many other  chronic and acute conditions are known to elevate hsTnI results.  Refer to the "Links" section for chest pain algorithms and additional  guidance. Performed at [facility], [address] [place], [place] [address]   Brain natriuretic peptide  Status: Abnormal   Collection Time: 06/08/23 10:21 AM  Result Value Ref Range   B Natriuretic Peptide 157.1 (H) 0.0 - 100.0 pg/mL    Comment: Performed at [facility], [address] [place], [place] [address]  Troponin I (High Sensitivity)     Status: Abnormal   Collection Time: 06/08/23 12:20 PM  Result Value Ref Range   Troponin I (High Sensitivity) 43 (H) <18 ng/L    Comment: (NOTE) Elevated high sensitivity troponin I (hsTnI) values and significant  changes across serial measurements may suggest ACS but many other  chronic and acute conditions are known to elevate hsTnI results.  Refer to the "Links" section for chest pain algorithms and additional  guidance. Performed at [facility], [address] [place], [place] [address]    DG Chest Lydia 1 View  Result Date: 06/08/2023 CLINICAL DATA:  SOB (shortness of breath) EXAM: PORTABLE CHEST 1 VIEW COMPARISON:  Chest x-ray February 06, 2022. FINDINGS: Right IJ approach Port-A-Cath with the tip projecting near the superior cavoatrial junction. Enlarged cardiac silhouette, likely accentuated by technique. Median sternotomy. Pulmonary vascular congestion. No visible pleural effusions or pneumothorax. No consolidation. IMPRESSION: Cardiomegaly and pulmonary vascular congestion without overt pulmonary edema. Electronically Signed   By: Feliberto Harts M.D.   On: 06/08/2023 10:37    Pending Labs Unresulted Labs (From admission, onward)    None       Vitals/Pain Today's  Vitals   06/08/23 1300 06/08/23 1330 06/08/23 1359 06/08/23 1400  BP: 138/71 (!) 145/68  (!) 154/80  Pulse: 86 87  81  Resp: 14 18  16   Temp:   98 F (36.7 C)   TempSrc:   Oral   SpO2: 95% 92%  95%  Weight:      Height:      PainSc:        Isolation Precautions No active isolations  Medications Medications - No data to display  Mobility walks     Focused Assessments Cardiac Assessment Handoff:  Cardiac Rhythm: Normal sinus rhythm No results found for: "CKTOTAL", "CKMB", "CKMBINDEX", "TROPONINI" Lab Results  Component Value Date   DDIMER 2.20 (H) 03/04/2021   Does the Patient currently have chest pain? No    R Recommendations: See Admitting Provider Note  Report given to:   Additional Notes:

## 2023-06-08 NOTE — ED Triage Notes (Signed)
Pt arrives to ED from his Cardiology appt with c/o SOB. He notes SOB over last several weeks that has worsened today. He notes bilateral arm pressure for at least two weeks. He states he has had to take his nitro several times recently for CP. Pt tachypneic in triage. 10lb weight gain last month.

## 2023-06-08 NOTE — Progress Notes (Signed)
Cardiology Office Note:  .   Date:  06/08/2023  ID:  [name], DOB [birth year], MRN [id number] PCP: [place], [name] G, MD  [facility] Providers Cardiologist:  Jodelle Red, MD {  History of Present Illness: .   [name] is a 77 y.o. male with a hx of atrial fibrillation, myocardial infarction, coronary artery disease, hypertension, hyperlipidemia, dyspnea, sleep apnea, chronic kidney disease, diabetes mellitus, cancer, who is seen for follow up today.   Today: Here for urgent visit today for shortness of breath.   He is extremely tachypneic on arrival, RR in the 30s. O2 sat 97. He notes bilateral arm pressure on and off for a few weeks. Has taken NG multiple times with some relief but not complete resolution. Notes 10 lb weight gain over the last month with mild increase in LE edema. No PND or orthopnea, uses CPAP at night.  Wife was unaware that he has been having bilateral arm pressure, sometimes in chest and sometimes in back.  No fevers/chills, no phlegm. I had him take two of his nitroglycerin while in the room with me. This relieved some of his discomfort but did not completely stop it.  ROS: ROS otherwise negative except as noted.   Studies Reviewed: Marland Kitchen    EKG:  EKG Interpretation  Date/Time:  Monday June 08 2023 09:33:15 EDT Ventricular Rate:  93 PR Interval:  178 QRS Duration: 86 QT Interval:  374 QTC Calculation: 465 R Axis:   -47 Text Interpretation: Sinus rhythm with Premature atrial complexes Left axis deviation Anterior infarct (cited on or before 06-Feb-2022) When compared with ECG of 06-Feb-2022 11:48, Premature atrial complexes are now Present Vent. rate has increased BY  32 BPM Questionable change in QRS axis Nonspecific T wave abnormality no longer evident in Inferior leads T wave inversion less evident in Lateral leads Confirmed by Jodelle Red [phone]) on 06/08/2023 9:36:51 AM    Physical Exam:   VS:  BP (!)  156/74   Pulse 93   Ht 5\' 11"  (1.803 m)   Wt 264 lb (119.7 kg)   SpO2 97%   BMI 36.82 kg/m    Wt Readings from Last 3 Encounters:  06/08/23 264 lb (119.7 kg)  04/17/23 259 lb (117.5 kg)  02/17/23 258 lb (117 kg)    GEN: Appears acutely uncomfortable, tachypneic. HEENT: Normal, moist mucous membranes NECK: No JVD CARDIAC: regular rhythm, normal S1 and S2, no rubs or gallops. No murmur. VASCULAR: Radial and DP pulses 2+ bilaterally. No carotid bruits RESPIRATORY:  Clear to auscultation without rales, wheezing or rhonchi. Tachypneic. ABDOMEN: Soft, non-tender, non-distended MUSCULOSKELETAL:  Ambulates independently SKIN: Warm and dry, bilateral compression stockings in place with trivial pitting edema NEUROLOGIC:  Alert and oriented x 3. No focal neuro deficits noted. PSYCHIATRIC:  Normal affect    ASSESSMENT AND PLAN: .    Bilateral arm pressure, with intermittent chest and back pressure, partially relieved with nitroglycerin Tachypnea Shortness of breath at rest 10 lb weight gain -no STEMI on ECG -partial relief with 2 nitroglycerin in the office, but not complete relief -with history of prior CABG, needs urgent evaluation -discussed with patient and wife, will send to ER. Unless another etiology found, suspect [facility] need [facility]. 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

## 2023-06-08 NOTE — ED Notes (Signed)
While pt was tachypneic returning from the restroom, O2 sats remained at 98% with transfer to wheelchair.

## 2023-06-08 NOTE — ED Provider Notes (Signed)
[place] EMERGENCY DEPARTMENT AT [facility] Provider Note   CSN: [id number] Arrival date & time: 06/08/23  3151     History  Chief Complaint  Patient presents with   Shortness of Breath    [name] is a 77 y.o. male.  Patient is a 77 year old male who presents with chest tightness and shortness of breath.  On chart review, he has a history of coronary artery disease status post prior MI, hypertension, hyperlipidemia, diabetes, chronic kidney disease and atrial fibrillation on Eliquis.  He says he has some baseline shortness of breath and he attributes that to being out of shape.  He says over the last 2 to 3 weeks he has been having some increased shortness of breath, mostly when he ambulates.  He is also had some associated tightness across his shoulders and sometimes his chest that also comes on with exertion and seems to improve with rest.  He occasionally will take a nitroglycerin which helps his symptoms.  He denies any increased leg swelling.  No fevers.  He has a little bit of a dry hacking cough.  No other URI symptoms.  No pleuritic symptoms.  He went to his cardiologist today due to the worsening symptoms and was told to come to the emergency room for further evaluation and likely admission.       Home Medications Prior to Admission medications   Medication Sig Start Date End Date Taking? Authorizing Provider  acetaminophen (TYLENOL) 500 MG tablet Take 1,000 mg by mouth 2 (two) times daily.    [provider]  amLODipine (NORVASC) 5 MG tablet Take 1 tablet (5 mg total) by mouth daily. 02/10/22   Alver Sorrow, NP  apixaban (ELIQUIS) 5 MG TABS tablet Take 5 mg by mouth 2 (two) times daily.    [provider]  atorvastatin (LIPITOR) 80 MG tablet Take 40 mg by mouth at bedtime. 09/22/19   [provider]  calcium-vitamin D (OSCAL WITH D) 500-200 MG-UNIT tablet Take 1 tablet by mouth 2 (two) times daily.    [provider]  ciclopirox (PENLAC) 8 % solution Apply topically at bedtime. Apply over nail and surrounding skin. Apply daily over previous coat. After seven (7) days, may remove with alcohol and continue cycle. 12/31/22   McDonald, Rachelle Hora, DPM  clotrimazole-betamethasone (LOTRISONE) cream Apply 1 application topically daily as needed. 11/13/21   [place], [name] G, MD  CRANBERRY PO Take by mouth.    [provider]  cycloSPORINE (RESTASIS) 0.05 % ophthalmic emulsion Place 1 drop into both eyes 2 (two) times daily as needed (dry eyes).    [provider]  ferrous sulfate 325 (65 FE) MG tablet Take 1 tablet (325 mg total) by mouth daily with breakfast. 11/13/21   [place], [name] G, MD  FLUoxetine (PROZAC) 20 MG capsule Take 40 mg by mouth daily.    [provider]  fluticasone (FLONASE) 50 MCG/ACT nasal spray Place 1 spray into both nostrils daily.    [provider]  furosemide (LASIX) 20 MG tablet Take 1 tablet (20 mg total) by mouth daily. 01/29/22   Swinyer, Zachary George, NP  glucose blood (ONETOUCH VERIO) test strip Use to test blood sugars 1-2 times daily. 07/26/21   [place], [name] G, MD  hydrALAZINE (APRESOLINE) 50 MG tablet Take 50 mg by mouth 3 (three) times daily.    [provider]  insulin glargine (LANTUS) 100 UNIT/ML injection Inject 0.45 mLs (45 Units total) into the skin  daily. 04/08/22   [place], [name] G, MD  insulin lispro (HUMALOG) 100 UNIT/ML injection Per sliding scale. 07/07/22   [place], [name] G, MD  lidocaine-prilocaine (EMLA) cream Apply 1 application  topically as needed.    [provider]  loratadine (CLARITIN) 10 MG tablet Take 10 mg by mouth daily as needed.    [provider]  Multiple Vitamin (MULTI-VITAMINS) TABS Take 1 tablet by mouth daily.    [provider]  nitroGLYCERIN (NITROSTAT) 0.3 MG SL tablet Place 0.3 mg under the tongue every 5 (five) minutes as needed for chest pain.    [provider]   nystatin powder Apply 1 application topically 3 (three) times daily. 11/13/21   [place], [name] G, MD  pantoprazole (PROTONIX) 40 MG tablet Take 1 tablet (40 mg total) by mouth daily. 07/15/21   [place], [name] G, MD  Probiotic Product (PROBIOTIC ADVANCED PO) Take 1 capsule by mouth at bedtime.    [provider]  tamsulosin (FLOMAX) 0.4 MG CAPS capsule Take 0.8 mg by mouth at bedtime. 03/27/21   [provider]  traZODone (DESYREL) 100 MG tablet Take 100 mg by mouth at bedtime.    [provider]  valsartan (DIOVAN) 160 MG tablet Take 1 tablet (160 mg total) by mouth daily. 04/22/23   Jodelle Red, MD  vitamin B-12 (CYANOCOBALAMIN) 1000 MCG tablet Take 1,000 mcg by mouth daily.    [provider]  Wheat Dextrin (BENEFIBER DRINK MIX PO) Take by mouth daily.    [provider]      Allergies    Keflex [cephalexin], Furosemide, Lisinopril, and Terazosin    Review of Systems   Review of Systems  Constitutional:  Positive for fatigue. Negative for chills, diaphoresis and fever.  HENT:  Negative for congestion, rhinorrhea and sneezing.   Eyes: Negative.   Respiratory:  Positive for cough, chest tightness and shortness of breath.   Cardiovascular:  Negative for chest pain and leg swelling.  Gastrointestinal:  Negative for abdominal pain, blood in stool, diarrhea, nausea and vomiting.  Genitourinary:  Negative for difficulty urinating, flank pain, frequency and hematuria.  Musculoskeletal:  Negative for arthralgias and back pain.  Skin:  Negative for rash.  Neurological:  Negative for dizziness, speech difficulty, weakness, numbness and headaches.    Physical Exam Updated Vital Signs BP 135/71   Pulse 80   Temp 97.8 F (36.6 C) (Oral)   Resp 13   Ht 5\' 11"  (1.803 m)   Wt 119.7 kg   SpO2 94%   BMI 36.82 kg/m  Physical Exam Constitutional:      Appearance: He is well-developed. He is obese.  HENT:     Head: Normocephalic and  atraumatic.  Eyes:     Pupils: Pupils are equal, round, and reactive to light.  Cardiovascular:     Rate and Rhythm: Normal rate and regular rhythm.     Heart sounds: Normal heart sounds.  Pulmonary:     Effort: Pulmonary effort is normal. Tachypnea present. No respiratory distress.     Breath sounds: Normal breath sounds. No wheezing or rales.     Comments: Mild tachypnea, no increased work of breathing Chest:     Chest wall: No tenderness.  Abdominal:     General: Bowel sounds are normal.     Palpations: Abdomen is soft.     Tenderness: There is no abdominal tenderness. There is no guarding or rebound.  Musculoskeletal:        General: Normal range  of motion.     Cervical back: Normal range of motion and neck supple.     Right lower leg: Edema present.     Left lower leg: Edema present.  Lymphadenopathy:     Cervical: No cervical adenopathy.  Skin:    General: Skin is warm and dry.     Findings: No rash.  Neurological:     Mental Status: He is alert and oriented to person, place, and time.     ED Results / Procedures / Treatments   Labs (all labs ordered are listed, but only abnormal results are displayed) Labs Reviewed  BASIC METABOLIC PANEL - Abnormal; Notable for the following components:      Result Value   Glucose, Bld 326 (*)    BUN 32 (*)    Creatinine, Ser 1.43 (*)    GFR, Estimated 50 (*)    All other components within normal limits  CBC - Abnormal; Notable for the following components:   WBC 11.8 (*)    RBC 3.62 (*)    Hemoglobin 11.8 (*)    HCT 35.1 (*)    All other components within normal limits  BRAIN NATRIURETIC PEPTIDE - Abnormal; Notable for the following components:   B Natriuretic Peptide 157.1 (*)    All other components within normal limits  TROPONIN I (HIGH SENSITIVITY) - Abnormal; Notable for the following components:   Troponin I (High Sensitivity) 32 (*)    All other components within normal limits  TROPONIN I (HIGH SENSITIVITY) -  Abnormal; Notable for the following components:   Troponin I (High Sensitivity) 43 (*)    All other components within normal limits    EKG EKG Interpretation  Date/Time:  Monday June 08 2023 10:10:02 EDT Ventricular Rate:  77 PR Interval:  188 QRS Duration: 84 QT Interval:  399 QTC Calculation: 452 R Axis:   -37 Text Interpretation: Sinus rhythm Left axis deviation Anterior infarct, old Minimal ST depression, lateral leads similar to EKG from same day Confirmed by Rolan Bucco [phone]) on 06/08/2023 10:43:34 AM  Radiology DG Chest Port 1 View  Result Date: 06/08/2023 CLINICAL DATA:  SOB (shortness of breath) EXAM: PORTABLE CHEST 1 VIEW COMPARISON:  Chest x-ray February 06, 2022. FINDINGS: Right IJ approach Port-A-Cath with the tip projecting near the superior cavoatrial junction. Enlarged cardiac silhouette, likely accentuated by technique. Median sternotomy. Pulmonary vascular congestion. No visible pleural effusions or pneumothorax. No consolidation. IMPRESSION: Cardiomegaly and pulmonary vascular congestion without overt pulmonary edema. Electronically Signed   By: Feliberto Harts M.D.   On: 06/08/2023 10:37    Procedures Procedures    Medications Ordered in ED Medications - No data to display  ED Course/ Medical Decision Making/ A&P                             Medical Decision Making Amount and/or Complexity of Data Reviewed Labs: ordered. Radiology: ordered.  Risk Decision regarding hospitalization.   Patient is a 78 year old man who presents with exertional chest/shoulder tightness associated with shortness of breath.  Chest x-ray shows cardiomegaly with some pulmonary vascular congestion.  This was interpreted by me and confirmed by the cardiologist.  However his BNP is only mildly elevated.  His EKG does not show ischemic changes.  His troponins are mildly elevated but relatively stable.  I spoke with Dr. Rennis Golden with cardiology who will admit the patient for further  evaluation.  He is on Eliquis so I  did not start him at this point on heparin or aspirin.  On chart review, I did not see any any recent cardiac evaluation/catheterization.  Final Clinical Impression(s) / ED Diagnoses Final diagnoses:  Unstable angina [facility])    Rx / DC Orders ED Discharge Orders     None         Rolan Bucco, MD 06/08/23 1507

## 2023-06-08 NOTE — ED Notes (Signed)
[facility] at [facility] will send transport. Bed Ready at [facility] 3E Rm#[id number].-[id number](NS)

## 2023-06-09 ENCOUNTER — Encounter (HOSPITAL_COMMUNITY)
Admission: EM | Disposition: A | Discharge status: Home / Self Care | Payer: Self-pay | Source: Ambulatory Visit | Attending: Internal Medicine

## 2023-06-09 ENCOUNTER — Telehealth: Payer: Self-pay | Admitting: Oncology

## 2023-06-09 ENCOUNTER — Inpatient Hospital Stay (HOSPITAL_COMMUNITY): Payer: Medicare Other

## 2023-06-09 DIAGNOSIS — I2510 Atherosclerotic heart disease of native coronary artery without angina pectoris: Secondary | ICD-10-CM | POA: Diagnosis not present

## 2023-06-09 DIAGNOSIS — I214 Non-ST elevation (NSTEMI) myocardial infarction: Secondary | ICD-10-CM | POA: Diagnosis not present

## 2023-06-09 DIAGNOSIS — E876 Hypokalemia: Secondary | ICD-10-CM

## 2023-06-09 DIAGNOSIS — I25810 Atherosclerosis of coronary artery bypass graft(s) without angina pectoris: Secondary | ICD-10-CM | POA: Diagnosis not present

## 2023-06-09 DIAGNOSIS — I5033 Acute on chronic diastolic (congestive) heart failure: Secondary | ICD-10-CM | POA: Diagnosis not present

## 2023-06-09 HISTORY — PX: LEFT HEART CATH AND CORS/GRAFTS ANGIOGRAPHY: CATH118250

## 2023-06-09 LAB — ECHOCARDIOGRAM COMPLETE
Height: 71 in
S' Lateral: 4 cm
Weight: 4222.25 oz

## 2023-06-09 LAB — HEPARIN LEVEL (UNFRACTIONATED): Heparin Unfractionated: >1.1 IU/mL — ABNORMAL HIGH (ref 0.30–0.70)

## 2023-06-09 LAB — BASIC METABOLIC PANEL
Anion gap: 8 (ref 5–15)
BUN: 26 mg/dL — ABNORMAL HIGH (ref 8–23)
CO2: 26 mmol/L (ref 22–32)
Calcium: 8.9 mg/dL (ref 8.9–10.3)
Chloride: 105 mmol/L (ref 98–111)
Creatinine, Ser: 1.54 mg/dL — ABNORMAL HIGH (ref 0.61–1.24)
GFR, Estimated: 46 mL/min — ABNORMAL LOW (ref >60)
Glucose, Bld: 70 mg/dL (ref 70–99)
Potassium: 3.8 mmol/L (ref 3.5–5.1)
Sodium: 139 mmol/L (ref 135–145)

## 2023-06-09 LAB — TROPONIN I (HIGH SENSITIVITY): Troponin I (High Sensitivity): 201 ng/L (ref <18)

## 2023-06-09 LAB — APTT: aPTT: 95 seconds — ABNORMAL HIGH (ref 24–36)

## 2023-06-09 LAB — GLUCOSE, CAPILLARY
Glucose-Capillary: 61 mg/dL — ABNORMAL LOW (ref 70–99)
Glucose-Capillary: 68 mg/dL — ABNORMAL LOW (ref 70–99)
Glucose-Capillary: 193 mg/dL — ABNORMAL HIGH (ref 70–99)

## 2023-06-09 LAB — LIPID PANEL
Cholesterol: 95 mg/dL (ref 0–200)
HDL: 40 mg/dL — ABNORMAL LOW (ref >40)
LDL Cholesterol: 47 mg/dL (ref 0–99)
Total CHOL/HDL Ratio: 2.4 RATIO
Triglycerides: 42 mg/dL (ref <150)
VLDL: 8 mg/dL (ref 0–40)

## 2023-06-09 LAB — HEMOGLOBIN A1C
Hgb A1c MFr Bld: 7.5 % — ABNORMAL HIGH (ref 4.8–5.6)
Mean Plasma Glucose: 168.55 mg/dL

## 2023-06-09 SURGERY — LEFT HEART CATH AND CORS/GRAFTS ANGIOGRAPHY
Anesthesia: LOCAL

## 2023-06-09 MED ORDER — SODIUM CHLORIDE 0.9% FLUSH: 3.0000 mL | INTRAVENOUS | Status: DC | PRN

## 2023-06-09 MED ORDER — SODIUM CHLORIDE 0.9 % WEIGHT BASED INFUSION: 3.0000 mL/kg/h | INTRAVENOUS | Status: AC

## 2023-06-09 MED ORDER — IOHEXOL 350 MG/ML SOLN
INTRAVENOUS | Status: DC | PRN
  Administered 2023-06-09: 80 mL

## 2023-06-09 MED ORDER — MIDAZOLAM HCL 2 MG/2ML IJ SOLN
INTRAMUSCULAR | Status: DC | PRN
  Administered 2023-06-09: 1 mg via INTRAVENOUS

## 2023-06-09 MED ORDER — HEPARIN SODIUM (PORCINE) 1000 UNIT/ML IJ SOLN
INTRAMUSCULAR | Status: DC | PRN
  Administered 2023-06-09: 5000 [IU] via INTRAVENOUS

## 2023-06-09 MED ORDER — MIDAZOLAM HCL 2 MG/2ML IJ SOLN
INTRAMUSCULAR | Status: AC
  Filled 2023-06-09: qty 2

## 2023-06-09 MED ORDER — LIDOCAINE HCL (PF) 1 % IJ SOLN
INTRAMUSCULAR | Status: AC
  Filled 2023-06-09: qty 30

## 2023-06-09 MED ORDER — SODIUM CHLORIDE 0.9% FLUSH: 3.0000 mL | Freq: Two times a day (BID) | INTRAVENOUS | Status: DC

## 2023-06-09 MED ORDER — SODIUM CHLORIDE 0.9 % IV SOLN: 250.0000 mL | INTRAVENOUS | Status: DC | PRN

## 2023-06-09 MED ORDER — SODIUM CHLORIDE 0.9% FLUSH
3.0000 mL | Freq: Two times a day (BID) | INTRAVENOUS | Status: DC
  Administered 2023-06-09 – 2023-06-10 (×2): 3 mL via INTRAVENOUS

## 2023-06-09 MED ORDER — LABETALOL HCL 5 MG/ML IV SOLN: 10.0000 mg | INTRAVENOUS | Status: AC | PRN

## 2023-06-09 MED ORDER — FENTANYL CITRATE (PF) 100 MCG/2ML IJ SOLN
INTRAMUSCULAR | Status: AC
  Filled 2023-06-09: qty 2

## 2023-06-09 MED ORDER — HYDRALAZINE HCL 20 MG/ML IJ SOLN: 10.0000 mg | INTRAMUSCULAR | Status: AC | PRN

## 2023-06-09 MED ORDER — APIXABAN 5 MG PO TABS: 5.0000 mg | ORAL_TABLET | Freq: Two times a day (BID) | ORAL | Status: DC

## 2023-06-09 MED ORDER — HEPARIN SODIUM (PORCINE) 1000 UNIT/ML IJ SOLN
INTRAMUSCULAR | Status: AC
  Filled 2023-06-09: qty 10

## 2023-06-09 MED ORDER — SODIUM CHLORIDE 0.9 % WEIGHT BASED INFUSION
1.0000 mL/kg/h | INTRAVENOUS | Status: DC
  Administered 2023-06-09: 1 mL/kg/h via INTRAVENOUS

## 2023-06-09 MED ORDER — HEPARIN (PORCINE) IN NACL 1000-0.9 UT/500ML-% IV SOLN
INTRAVENOUS | Status: DC | PRN
  Administered 2023-06-09 (×2): 500 mL

## 2023-06-09 MED ORDER — ASPIRIN 81 MG PO CHEW
81.0000 mg | CHEWABLE_TABLET | ORAL | Status: AC
  Administered 2023-06-09: 81 mg via ORAL
  Filled 2023-06-09: qty 1

## 2023-06-09 MED ORDER — ACETAMINOPHEN 325 MG PO TABS
650.0000 mg | ORAL_TABLET | ORAL | Status: DC | PRN
  Administered 2023-06-09 – 2023-06-10 (×2): 650 mg via ORAL
  Filled 2023-06-09 (×2): qty 2

## 2023-06-09 MED ORDER — VERAPAMIL HCL 2.5 MG/ML IV SOLN
INTRA_ARTERIAL | Status: DC | PRN
  Administered 2023-06-09: 5 mL via INTRA_ARTERIAL

## 2023-06-09 MED ORDER — VERAPAMIL HCL 2.5 MG/ML IV SOLN
INTRAVENOUS | Status: AC
  Filled 2023-06-09: qty 2

## 2023-06-09 MED ORDER — FENTANYL CITRATE (PF) 100 MCG/2ML IJ SOLN
INTRAMUSCULAR | Status: DC | PRN
  Administered 2023-06-09: 25 ug via INTRAVENOUS

## 2023-06-09 MED ORDER — APIXABAN 5 MG PO TABS
5.0000 mg | ORAL_TABLET | Freq: Two times a day (BID) | ORAL | Status: DC
  Administered 2023-06-10: 5 mg via ORAL
  Filled 2023-06-09: qty 1

## 2023-06-09 MED ORDER — INSULIN ASPART 100 UNIT/ML IJ SOLN
0.0000 [IU] | Freq: Three times a day (TID) | INTRAMUSCULAR | Status: DC
  Administered 2023-06-09: 3 [IU] via SUBCUTANEOUS

## 2023-06-09 MED ORDER — ONDANSETRON HCL 4 MG/2ML IJ SOLN: 4.0000 mg | Freq: Four times a day (QID) | INTRAMUSCULAR | Status: DC | PRN

## 2023-06-09 MED ORDER — FUROSEMIDE 10 MG/ML IJ SOLN
40.0000 mg | Freq: Once | INTRAMUSCULAR | Status: AC
  Administered 2023-06-09: 40 mg via INTRAVENOUS
  Filled 2023-06-09: qty 4

## 2023-06-09 MED ORDER — LIDOCAINE HCL (PF) 1 % IJ SOLN
INTRAMUSCULAR | Status: DC | PRN
  Administered 2023-06-09: 2 mL

## 2023-06-09 SURGICAL SUPPLY — 13 items
CATH INFINITI 6F ANG MULTIPACK (CATHETERS) IMPLANT
CATH INFINITI 6F FL3.5 (CATHETERS) IMPLANT
DEVICE RAD COMP TR BAND LRG (VASCULAR PRODUCTS) IMPLANT
ELECT DEFIB PAD ADLT CADENCE (PAD) IMPLANT
GLIDESHEATH SLEND SS 6F .021 (SHEATH) IMPLANT
GUIDEWIRE INQWIRE 1.5J.035X260 (WIRE) IMPLANT
INQWIRE 1.5J .035X260CM (WIRE) ×1 IMPLANT
KIT HEART LEFT (KITS) ×1 IMPLANT
PACK CARDIAC CATHETERIZATION (CUSTOM PROCEDURE TRAY) ×1 IMPLANT
SHEATH PROBE COVER 6X72 (BAG) IMPLANT
TRANSDUCER W/STOPCOCK (MISCELLANEOUS) ×1 IMPLANT
TUBING CIL FLEX 10 FLL-RA (TUBING) ×1 IMPLANT
VALVE MANIFOLD 3 PORT W/RA/ON (MISCELLANEOUS) IMPLANT

## 2023-06-09 NOTE — Progress Notes (Signed)
Echocardiogram 2D Echocardiogram has been performed.  [name] 06/09/2023, 1:53 PM

## 2023-06-09 NOTE — Interval H&P Note (Signed)
History and Physical Interval Note:  06/09/2023 12:29 PM  [name]  has presented today for surgery, with the diagnosis of nstemi.  The various methods of treatment have been discussed with the patient and family. After consideration of risks, benefits and other options for treatment, the patient has consented to  Procedure(s): LEFT HEART CATH AND CORS/GRAFTS ANGIOGRAPHY (N/A) as a surgical intervention.  The patient's history has been reviewed, patient examined, no change in status, stable for surgery.  I have reviewed the patient's chart and labs.  Questions were answered to the patient's satisfaction.    Cath Lab Visit (complete for each Cath Lab visit)  Clinical Evaluation Leading to the Procedure:   ACS: Yes.    Non-ACS:    Anginal Classification: CCS IV  Anti-ischemic medical therapy: Maximal Therapy (2 or more classes of medications)  Non-Invasive Test Results: No non-invasive testing performed  Prior CABG: Previous CABG        [name]

## 2023-06-09 NOTE — Telephone Encounter (Signed)
Patient wife called and cancel appointment 06/12/23 he is in the hospital

## 2023-06-09 NOTE — H&P (View-Only) (Signed)
 Rounding Note    [name] Name: [name] Date of Encounter: 06/09/2023  [facility] Cardiologist: Bridgette Christopher, MD   Subjective   No complaints  Inpatient Medications    Scheduled Meds:  amLODipine  5 mg Oral Daily   aspirin EC  81 mg Oral Daily   atorvastatin  40 mg Oral QHS   Chlorhexidine Gluconate Cloth  6 each Topical Daily   FLUoxetine  40 mg Oral Daily   fluticasone  1 spray Each Nare Daily   insulin aspart  0-15 Units Subcutaneous TID WC & HS   metoprolol tartrate  12.5 mg Oral BID   pantoprazole  40 mg Oral Daily   tamsulosin  0.8 mg Oral QHS   traZODone  100 mg Oral QHS   Continuous Infusions:  heparin 1,500 Units/hr (06/08/23 2251)   PRN Meds: acetaminophen, cycloSPORINE, loratadine, nitroGLYCERIN, ondansetron (ZOFRAN) IV   Vital Signs    Vitals:   06/08/23 1800 06/08/23 1950 06/08/23 2348 06/09/23 0423  BP: 127/88 (!) 165/65 119/72 111/70  Pulse: (!) 38  71 74  Resp:  18 18 19  Temp:  97.7 F (36.5 C) 97.8 F (36.6 C) 97.7 F (36.5 C)  TempSrc:   Oral Oral  SpO2: 95% 95% 97% 99%  Weight:    119.7 kg  Height:        Intake/Output Summary (Last 24 hours) at 06/09/2023 0811 Last data filed at 06/08/2023 2300 Gross per 24 hour  Intake 240 ml  Output --  Net 240 ml      06/09/2023    4:23 AM 06/08/2023   10:25 AM 06/08/2023    9:30 AM  Last 3 Weights  Weight (lbs) 263 lb 14.3 oz 264 lb 264 lb  Weight (kg) 119.7 kg 119.75 kg 119.75 kg      Telemetry    Sinus rhythm with PACs HR  60s-70s- Personally Reviewed  ECG    Sinus Rhythm old anterioseptal infarct. Biphasic t waves, nonspecific changes, HR 60  - Personally Reviewed  Physical Exam   GEN: No acute distress.   Cardiac: RRR, no murmurs, rubs, or gallops.  Respiratory: Clear to auscultation bilaterally. MS: 1+ LE edema, No deformity. Neuro:  Nonfocal  Psych: Normal affect   Labs    High Sensitivity Troponin:   Recent Labs  Lab 06/08/23 1021  06/08/23 1220 06/08/23 2244  TROPONINIHS 32* 43* 170*     Chemistry Recent Labs  Lab 06/08/23 1021 06/08/23 2244  NA 135 139  K 3.8 3.3*  CL 102 103  CO2 23 23  GLUCOSE 326* 132*  BUN 32* 25*  CREATININE 1.43* 1.29*  CALCIUM 9.4 8.9  MG  --  1.9  PROT  --  6.8  ALBUMIN  --  3.1*  AST  --  25  ALT  --  18  ALKPHOS  --  87  BILITOT  --  0.5  GFRNONAA 50* 57*  ANIONGAP 10 13     Hematology Recent Labs  Lab 06/08/23 1021 06/08/23 2244  WBC 11.8* 13.3*  RBC 3.62* 3.72*  HGB 11.8* 12.1*  HCT 35.1* 35.8*  MCV 97.0 96.2  MCH 32.6 32.5  MCHC 33.6 33.8  RDW 13.4 13.4  PLT 264 258   Thyroid  Recent Labs  Lab 06/08/23 2246  TSH 2.210    BNP Recent Labs  Lab 06/08/23 1021 06/08/23 2245  BNP 157.1* 347.4*     Radiology    DG Chest Port 1 View    Result Date: 06/08/2023 CLINICAL DATA:  SOB (shortness of breath) EXAM: PORTABLE CHEST 1 VIEW COMPARISON:  Chest x-ray February 06, 2022. FINDINGS: Right IJ approach Port-A-Cath with Bill tip projecting near Bill superior cavoatrial junction. Enlarged cardiac silhouette, likely accentuated by technique. Median sternotomy. Pulmonary vascular congestion. No visible pleural effusions or pneumothorax. No consolidation. IMPRESSION: Cardiomegaly and pulmonary vascular congestion without overt pulmonary edema. Electronically Signed   By: Frederick S Jones M.D.   On: 06/08/2023 10:37    Cardiac Studies   Chest x-ray 06/08/23 IMPRESSION: Cardiomegaly and pulmonary vascular congestion without overt pulmonary edema.  Echo pending    [name] Profile     77 y.o. 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   Assessment & Plan    NSTEMI CAD s/p CABG (2004) and PCI (2018) --presented with progressive chest pain  radiating to Bill arms and sob relieved with 2 SL nitroglycerin.  -- mildly elevated toponins at 170 -- LHC today or tomorrow, has been NPO. Last dose eliquis Monday at 9am -- continue aspirin 81mg daily, atorvastatin 40mg daily, lopressor 12.5 mg BID  Acute on Chronic HfpEF -- reports 10lb weight gain, BNP 347.4 -- vascular congestion on x-ray, reports sob/orthopnea yesterday -- give one dose IV lasix 40mg,  -- consider jardiance   Paroxymal A-fib -- in sinus rhythm, hold eliquis for now.   HTN -- reasonable control  -- continue amlodipine, lopressor 12.5 mg BID  DM2 -- on SSI  -- A1c 7.5  Hypokalemia -- 3.3 -- recheck now   Informed Consent   Shared Decision Making/Informed Consent{  Bill risks [stroke (1 in 1000), death (1 in 1000), kidney failure [usually temporary] (1 in 500), bleeding (1 in 200), allergic reaction [possibly serious] (1 in 200)], benefits (diagnostic support and management of coronary artery disease) and alternatives of a cardiac catheterization were discussed in detail with [name] and he is willing to proceed.      For questions or updates, please contact  [facility] Please consult [email or web address] for contact info under   Signed, Mary E Neal, RN Student Nurse Practitioner  06/09/2023, 8:11 AM     [name] seen, examined. Available data reviewed. Agree with findings, assessment, and plan as outlined by Mary Neal, RN, NP student.  [name] is independently interviewed and examined.  He is alert, oriented, obese male in no distress.  HEENT is normal, JVP is moderately elevated, carotid upstrokes are normal without bruits, lungs are clear bilaterally, heart is regular rate and rhythm with no murmur or gallop, abdomen is soft and nontender, extremities have 1+ pretibial and ankle edema.  [name] has signs and symptoms of both acute on chronic heart failure with preserved ejection fraction as well as acute coronary syndrome with elevated  high-sensitivity troponin.  I agree that cardiac catheterization is indicated.  He states that he has had cardiac cath attempted from left radial access in Bill past without success.  He will likely need to be done via a femoral approach.  He is status post multivessel CABG.  Will try to get his operative report.  He had his last dose of apixaban yesterday morning, so I would anticipate that we do his procedure sometime after noon today versus tomorrow pending Bill discretion of Bill interventional cardiologist.  Since he requires chronic oral anticoagulation, clopidogrel would be appropriate   for him if he is found to have severe coronary stenosis and/or requires PCI. I have reviewed Bill risks, indications, and alternatives to cardiac catheterization, possible angioplasty, and stenting with [name]. Risks include but are not limited to bleeding, infection, vascular injury, stroke, myocardial infection, arrhythmia, kidney injury, radiation-related injury in Bill case of prolonged fluoroscopy use, emergency cardiac surgery, and death. [name] understands Bill risks of serious complication is 1-2 in 1000 with diagnostic cardiac cath and 1-2% or less with angioplasty/stenting.  [name] also states that he has had a lot of problems with periprocedural urinary retention and requested an indwelling Foley catheter if he is going to undergo cardiac catheterization from femoral access.  Will place this order.  Jasmin Winberry, M.D. 06/09/2023 11:34 AM  

## 2023-06-09 NOTE — Plan of Care (Signed)
  Problem: Education: Goal: Knowledge of General Education information will improve Description: Including pain rating scale, medication(s)/side effects and non-pharmacologic comfort measures Outcome: Progressing   Problem: Elimination: Goal: Will not experience complications related to bowel motility Outcome: Progressing   Problem: Clinical Measurements: Goal: Diagnostic test results will improve Outcome: Not Progressing Goal: Respiratory complications will improve Outcome: Not Progressing   Problem: Activity: Goal: Risk for activity intolerance will decrease Outcome: Not Progressing   Problem: Coping: Goal: Level of anxiety will decrease Outcome: Not Progressing   Problem: Activity: Goal: Ability to tolerate increased activity will improve Outcome: Not Progressing

## 2023-06-09 NOTE — TOC Initial Note (Signed)
Transition of Care [facility]) - Initial/Assessment Note    Patient Details  Name: [name] MRN: [id number] Date of Birth: Oct 29, 1946  Transition of [facility]) CM/SW Contact:    Leone Haven, RN Phone Number: 06/09/2023, 11:35 AM  Clinical Narrative:                 From home with wife, indep, he has PCP and insurance on file, he also is a [facility] [place] Patient where he gets his medications from.  He currently has no HH services in place and does not have any DME.   His wife will transport him home at dc and she is his support system.  Plan is for cath today.   Expected Discharge Plan: Home/Self Care Barriers to Discharge: Continued Medical Work up   Patient Goals and CMS Choice Patient states their goals for this hospitalization and ongoing recovery are:: return home   Choice offered to / list presented to : NA      Expected Discharge Plan and Services In-house Referral: NA Discharge Planning Services: CM Consult Post Acute Care Choice: NA Living arrangements for the past 2 months: Single Family Home                 DME Arranged: N/A DME Agency: NA       HH Arranged: NA          Prior Living Arrangements/Services Living arrangements for the past 2 months: Single Family Home Lives with:: Spouse Patient language and need for interpreter reviewed:: Yes Do you feel safe going back to the place where you live?: Yes      Need for Family Participation in Patient Care: No (Comment) Care giver support system in place?: Yes (comment)   Criminal Activity/Legal Involvement Pertinent to Current Situation/Hospitalization: No - Comment as needed  Activities of Daily Living Home Assistive Devices/Equipment: Walker (specify type) (front wheel) ADL Screening (condition at time of admission) Patient's cognitive ability adequate to safely complete daily activities?: Yes Is the patient deaf or have difficulty hearing?: No Does the patient have difficulty seeing,  even when wearing glasses/contacts?: No Does the patient have difficulty concentrating, remembering, or making decisions?: No Patient able to express need for assistance with ADLs?: Yes Does the patient have difficulty dressing or bathing?: Yes Independently performs ADLs?: No Does the patient have difficulty walking or climbing stairs?: Yes Weakness of Legs: Both Weakness of Arms/Hands: None  Permission Sought/Granted                  Emotional Assessment Appearance:: Appears stated age Attitude/Demeanor/Rapport: Engaged Affect (typically observed): Appropriate Orientation: : Oriented to Self, Oriented to Place, Oriented to  Time, Oriented to Situation Alcohol / Substance Use: Not Applicable Psych Involvement: No (comment)  Admission diagnosis:  Unstable angina (HCC) [I20.0] Patient Active Problem List   Diagnosis Date Noted   Unstable angina (HCC) 06/08/2023   Foot pain, right 12/10/2022   Bilateral primary osteoarthritis of knee 10/15/2022   PAD (peripheral artery disease) (HCC) 07/07/2022   Adrenal mass (HCC) 04/12/2022   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 04/08/2022   Atherosclerosis of aorta (HCC) 02/21/2022   Acute-on-chronic kidney injury (HCC) 02/07/2022   OSA (obstructive sleep apnea) 02/07/2022   History of pancreatic cancer 02/07/2022   Pancreatitis 02/07/2022   Acute pancreatitis 02/06/2022   Foot callus 10/21/2021   Cellulitis 09/22/2021   Gastritis, 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   Pancreatic cancer (HCC) 07/01/2021   Pancreatic adenocarcinoma (HCC) 07/01/2021   Coronary artery disease  of bypass graft of native heart with stable angina pectoris (HCC) 06/04/2021   Genetic testing 04/04/2021   History of prostate cancer    Family history of breast cancer    Port-A-Cath in place 03/14/2021   Atrial fibrillation (HCC)    Syncope and collapse 03/04/2021   Primary cancer of body of pancreas (HCC) 01/31/2021   Chest pain of uncertain etiology 10/17/2020   Elevated  lipase 10/17/2020   H/O agent Orange exposure 10/17/2020   Dyslipidemia, goal LDL below 70 10/17/2020   Hx of CABG 02/28/2020   DM (diabetes mellitus), type 2 with renal complications (HCC) 04/01/2019   Hypertension with heart disease 04/01/2019   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   CKD (chronic kidney disease), stage III (HCC) 04/01/2019   Peripheral neuropathy 04/01/2019   Insomnia 11/30/2018   PCP:  [place], [name] G, MD Pharmacy:   [facility] [phone] [place], [place] - [address] AT [facility] OF [address] & [facility] [address] [place] [address] Phone: [phone] Fax: [phone]     Social Determinants of Health (SDOH) Social History: SDOH Screenings   Food Insecurity: No Food Insecurity (06/09/2023)  Housing: Low Risk  (06/09/2023)  Transportation Needs: No Transportation Needs (06/09/2023)  Utilities: Not At Risk (06/09/2023)  Alcohol Screen: Low Risk  (12/16/2022)  Depression (PHQ2-9): Low Risk  (12/16/2022)  Recent Concern: Depression (PHQ2-9) - Medium Risk (12/10/2022)  Financial Resource Strain: Low Risk  (12/16/2022)  Physical Activity: Insufficiently Active (12/16/2022)  Social Connections: Socially Integrated (12/16/2022)  Stress: No Stress Concern Present (12/16/2022)  Tobacco Use: Medium Risk (06/08/2023)   SDOH Interventions:     Readmission Risk Interventions    02/07/2022    9:58 AM 09/26/2021    2:17 PM  Readmission Risk Prevention Plan  Transportation Screening Complete Complete  PCP or Specialist Appt within 3-5 Days Complete Complete  HRI or Home Care Consult Complete Complete  Social Work Consult for Recovery Care Planning/Counseling Complete Complete  Palliative Care Screening Not Applicable Not Applicable  Medication Review Oceanographer) Complete Complete

## 2023-06-09 NOTE — Plan of Care (Signed)
  Problem: Clinical Measurements: Goal: Diagnostic test results will improve Outcome: Progressing   Problem: Activity: Goal: Risk for activity intolerance will decrease Outcome: Progressing   Problem: Coping: Goal: Level of anxiety will decrease Outcome: Progressing   

## 2023-06-09 NOTE — Consult Note (Signed)
   [facility] Inpatient Consult   06/09/2023  Abb Gobert [facility] [birth year] [id number]  [facility] [[facility]]  Accountable Care Organization [ACO] Patient: Medicare ACO REACH   Primary Care Provider:  [place], [name] G, MD With [place] [facility]  Patient screened for hospitalization with noted medium with rising risk score for unplanned readmission risk to assess for potential [facility]  [[facility]] Care Management service needs for post hospital transition for care coordination.  Review of patient's electronic medical record reveals patient is post procedure with staff in the room on rounds.   Plan:  Continue to follow progress and disposition to assess for post hospital community care coordination/management needs.  Referral request for community care coordination: following for needs.  Of note, [facility] Care Management/Population Health does not replace or interfere with any arrangements made by the Inpatient Transition of Care team.  For questions contact:   Charlesetta Shanks, RN BSN CCM [facility]  [phone] business mobile phone Toll free office [phone]  *Concierge Line  [phone] Fax number: [phone] [email or web address]

## 2023-06-09 NOTE — Progress Notes (Addendum)
Rounding Note    Patient Name: [name] Date of Encounter: 06/09/2023  [facility] Cardiologist: Jodelle Red, MD   Subjective   No complaints  Inpatient Medications    Scheduled Meds:  amLODipine  5 mg Oral Daily   aspirin EC  81 mg Oral Daily   atorvastatin  40 mg Oral QHS   Chlorhexidine Gluconate Cloth  6 each Topical Daily   FLUoxetine  40 mg Oral Daily   fluticasone  1 spray Each Nare Daily   insulin aspart  0-15 Units Subcutaneous TID WC & HS   metoprolol tartrate  12.5 mg Oral BID   pantoprazole  40 mg Oral Daily   tamsulosin  0.8 mg Oral QHS   traZODone  100 mg Oral QHS   Continuous Infusions:  heparin 1,500 Units/hr (06/08/23 2251)   PRN Meds: acetaminophen, cycloSPORINE, loratadine, nitroGLYCERIN, ondansetron (ZOFRAN) IV   Vital Signs    Vitals:   06/08/23 1800 06/08/23 1950 06/08/23 2348 06/09/23 0423  BP: 127/88 (!) 165/65 119/72 111/70  Pulse: (!) 38  71 74  Resp:  18 18 19   Temp:  97.7 F (36.5 C) 97.8 F (36.6 C) 97.7 F (36.5 C)  TempSrc:   Oral Oral  SpO2: 95% 95% 97% 99%  Weight:    119.7 kg  Height:        Intake/Output Summary (Last 24 hours) at 06/09/2023 0811 Last data filed at 06/08/2023 2300 Gross per 24 hour  Intake 240 ml  Output --  Net 240 ml      06/09/2023    4:23 AM 06/08/2023   10:25 AM 06/08/2023    9:30 AM  Last 3 Weights  Weight (lbs) 263 lb 14.3 oz 264 lb 264 lb  Weight (kg) 119.7 kg 119.75 kg 119.75 kg      Telemetry    Sinus rhythm with PACs HR  60s-70s- Personally Reviewed  ECG    Sinus Rhythm old anterioseptal infarct. Biphasic t waves, nonspecific changes, HR 60  - Personally Reviewed  Physical Exam   GEN: No acute distress.   Cardiac: RRR, no murmurs, rubs, or gallops.  Respiratory: Clear to auscultation bilaterally. MS: 1+ LE edema, No deformity. Neuro:  Nonfocal  Psych: Normal affect   Labs    High Sensitivity Troponin:   Recent Labs  Lab 06/08/23 1021  06/08/23 1220 06/08/23 2244  TROPONINIHS 32* 43* 170*     Chemistry Recent Labs  Lab 06/08/23 1021 06/08/23 2244  NA 135 139  K 3.8 3.3*  CL 102 103  CO2 23 23  GLUCOSE 326* 132*  BUN 32* 25*  CREATININE 1.43* 1.29*  CALCIUM 9.4 8.9  MG  --  1.9  PROT  --  6.8  ALBUMIN  --  3.1*  AST  --  25  ALT  --  18  ALKPHOS  --  87  BILITOT  --  0.5  GFRNONAA 50* 57*  ANIONGAP 10 13     Hematology Recent Labs  Lab 06/08/23 1021 06/08/23 2244  WBC 11.8* 13.3*  RBC 3.62* 3.72*  HGB 11.8* 12.1*  HCT 35.1* 35.8*  MCV 97.0 96.2  MCH 32.6 32.5  MCHC 33.6 33.8  RDW 13.4 13.4  PLT 264 258   Thyroid  Recent Labs  Lab 06/08/23 2246  TSH 2.210    BNP Recent Labs  Lab 06/08/23 1021 06/08/23 2245  BNP 157.1* 347.4*     Radiology    DG Chest  [facility] 1 [address]  Result Date: 06/08/2023 CLINICAL DATA:  SOB (shortness of breath) EXAM: PORTABLE CHEST 1 VIEW COMPARISON:  Chest x-ray February 06, 2022. FINDINGS: Right IJ approach Port-A-Cath with the tip projecting near the superior cavoatrial junction. Enlarged cardiac silhouette, likely accentuated by technique. Median sternotomy. Pulmonary vascular congestion. No visible pleural effusions or pneumothorax. No consolidation. IMPRESSION: Cardiomegaly and pulmonary vascular congestion without overt pulmonary edema. Electronically Signed   By: Feliberto Harts M.D.   On: 06/08/2023 10:37    Cardiac Studies   Chest x-ray 06/08/23 IMPRESSION: Cardiomegaly and pulmonary vascular congestion without overt pulmonary edema.  Echo pending    Patient Profile     77 y.o. male with a history of paroxysmal atrial fibrillation, coronary artery disease with previous myocardial infarction and history of CABG, hypertension, hyperlipidemia, CKD 3A, diabetes, obstructive sleep apnea who was seen 06/08/2023 for the evaluation of angina.   Assessment & Plan    NSTEMI CAD s/p CABG (2004) and PCI (2018) --presented with progressive chest pain  radiating to the arms and sob relieved with 2 SL nitroglycerin.  -- mildly elevated toponins at 170 -- LHC today or tomorrow, has been NPO. Last dose eliquis Monday at 9am -- continue aspirin 81mg  daily, atorvastatin 40mg  daily, lopressor 12.5 mg BID  Acute on Chronic HfpEF -- reports 10lb weight gain, BNP 347.4 -- vascular congestion on x-ray, reports sob/orthopnea yesterday -- give one dose IV lasix 40mg ,  -- consider jardiance   Paroxymal A-fib -- in sinus rhythm, hold eliquis for now.   HTN -- reasonable control  -- continue amlodipine, lopressor 12.5 mg BID  DM2 -- on SSI  -- A1c 7.5  Hypokalemia -- 3.3 -- recheck now   Informed Consent   Shared Decision Making/Informed Consent{  The risks [stroke (1 in 1000), death (1 in 1000), kidney failure [usually temporary] (1 in 500), bleeding (1 in 200), allergic reaction [possibly serious] (1 in 200)], benefits (diagnostic support and management of coronary artery disease) and alternatives of a cardiac catheterization were discussed in detail with [name] and he is willing to proceed.      For questions or updates, please contact [facility] Please consult [email or web address] for contact info under   Signed, Osborne Oman, RN Student Nurse Practitioner  06/09/2023, 8:11 AM     Patient seen, examined. Available data reviewed. Agree with findings, assessment, and plan as outlined by [name], RN, NP student.  The patient is independently interviewed and examined.  He is alert, oriented, obese male in no distress.  HEENT is normal, JVP is moderately elevated, carotid upstrokes are normal without bruits, lungs are clear bilaterally, heart is regular rate and rhythm with no murmur or gallop, abdomen is soft and nontender, extremities have 1+ pretibial and ankle edema.  The patient has signs and symptoms of both acute on chronic heart failure with preserved ejection fraction as well as acute coronary syndrome with elevated  high-sensitivity troponin.  I agree that cardiac catheterization is indicated.  He states that he has had cardiac cath attempted from left radial access in the past without success.  He will likely need to be done via a femoral approach.  He is status post multivessel CABG.  Will try to get his operative report.  He had his last dose of apixaban yesterday morning, so I would anticipate that we do his procedure sometime after noon today versus tomorrow pending the discretion of the interventional cardiologist.  Since he requires chronic oral anticoagulation, clopidogrel would be appropriate  for him if he is found to have severe coronary stenosis and/or requires PCI. I have reviewed the risks, indications, and alternatives to cardiac catheterization, possible angioplasty, and stenting with the patient. Risks include but are not limited to bleeding, infection, vascular injury, stroke, myocardial infection, arrhythmia, kidney injury, radiation-related injury in the case of prolonged fluoroscopy use, emergency cardiac surgery, and death. The patient understands the risks of serious complication is 1-2 in 1000 with diagnostic cardiac cath and 1-2% or less with angioplasty/stenting.  The patient also states that he has had a lot of problems with periprocedural urinary retention and requested an indwelling Foley catheter if he is going to undergo cardiac catheterization from femoral access.  Will place this order.  Tonny Bollman, M.D. 06/09/2023 11:34 AM

## 2023-06-09 NOTE — Progress Notes (Signed)
ANTICOAGULATION CONSULT NOTE  Pharmacy Consult for Heparin Indication: chest pain/ACS  Allergies  Allergen Reactions   Keflex [Cephalexin] Other (See Comments)    Hallucinations?   Furosemide Other (See Comments)    Unknown   Lisinopril Other (See Comments)    Unknown   Terazosin Other (See Comments)    Unknown    Patient Measurements: Height: 5\' 11"  (180.3 cm) Weight: 119.7 kg (263 lb 14.3 oz) IBW/kg (Calculated) : 75.3 Heparin Dosing Weight: 102 kg  Vital Signs: Temp: 97.7 F (36.5 C) (06/25 0423) Temp Source: Oral (06/25 0423) BP: 111/70 (06/25 0423) Pulse Rate: 74 (06/25 0423)  Labs: Recent Labs    06/08/23 1021 06/08/23 1220 06/08/23 2244 06/09/23 0636  HGB 11.8*  --  12.1*  --   HCT 35.1*  --  35.8*  --   PLT 264  --  258  --   APTT  --   --   --  95*  LABPROT  --   --  16.6*  --   INR  --   --  1.3*  --   HEPARINUNFRC  --   --   --  >1.10*  CREATININE 1.43*  --  1.29*  --   TROPONINIHS 32* 43* 170*  --      Estimated Creatinine Clearance: 62.1 mL/min (A) (by C-G formula based on SCr of 1.29 mg/dL (H)).   Medical History: Past Medical History:  Diagnosis Date   A-fib (HCC) 03/04/2021   Anemia    Arthritis    Cancer (HCC)    prostate   Chronic kidney disease    blood in urine    Constipation    Coronary artery disease    Depression    Diabetes mellitus without complication (HCC)    Difficult intubation    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 it was hard to get the tube down his throat   Dyspnea    Family history of breast cancer    Fatty liver    Fatty liver    Frequent headaches    GERD (gastroesophageal reflux disease)    History of chicken pox    History of fainting spells of unknown cause    History of prostate cancer    Hyperlipidemia    Hypertension    Myocardial infarction (HCC)    Peripheral neuropathy    Pneumonia    Prostate cancer (HCC)    PTSD (post-traumatic stress disorder)    Sleep apnea    uses Cpap    Medications:   Medications Prior to Admission  Medication Sig Dispense Refill Last Dose   acetaminophen (TYLENOL) 500 MG tablet Take 1,000 mg by mouth 2 (two) times daily.   Unknown   amLODipine (NORVASC) 5 MG tablet Take 1 tablet (5 mg total) by mouth daily. 90 tablet 3 Unknown   apixaban (ELIQUIS) 5 MG TABS tablet Take 5 mg by mouth 2 (two) times daily.   Unknown   atorvastatin (LIPITOR) 80 MG tablet Take 40 mg by mouth at bedtime.   Unknown   calcium-vitamin D (OSCAL WITH D) 500-200 MG-UNIT tablet Take 1 tablet by mouth 2 (two) times daily.   Unknown   ciclopirox (PENLAC) 8 % solution Apply topically at bedtime. Apply over nail and surrounding skin. Apply daily over previous coat. After seven (7) days, may remove with alcohol and continue cycle. 6.6 mL 0 Unknown   clotrimazole-betamethasone (LOTRISONE) cream Apply 1 application topically daily as needed. 45 g 2 Unknown   CRANBERRY PO Take  by mouth.   Unknown   cycloSPORINE (RESTASIS) 0.05 % ophthalmic emulsion Place 1 drop into both eyes 2 (two) times daily as needed (dry eyes).   Unknown   ferrous sulfate 325 (65 FE) MG tablet Take 1 tablet (325 mg total) by mouth daily with breakfast. 90 tablet 2 Unknown   FLUoxetine (PROZAC) 20 MG capsule Take 40 mg by mouth daily.   Unknown   fluticasone (FLONASE) 50 MCG/ACT nasal spray Place 1 spray into both nostrils daily.   Unknown   furosemide (LASIX) 20 MG tablet Take 1 tablet (20 mg total) by mouth daily. 90 tablet 3 Unknown   glucose blood (ONETOUCH VERIO) test strip Use to test blood sugars 1-2 times daily. 200 each 12 Unknown   hydrALAZINE (APRESOLINE) 50 MG tablet Take 50 mg by mouth 3 (three) times daily.   Unknown   insulin glargine (LANTUS) 100 UNIT/ML injection Inject 0.45 mLs (45 Units total) into the skin daily. 10 mL 3 Unknown   insulin lispro (HUMALOG) 100 UNIT/ML injection Per sliding scale. 10 mL 11 Unknown   lidocaine-prilocaine (EMLA) cream Apply 1 application  topically as needed.   Unknown    loratadine (CLARITIN) 10 MG tablet Take 10 mg by mouth daily as needed.   Unknown   Multiple Vitamin (MULTI-VITAMINS) TABS Take 1 tablet by mouth daily.   Unknown   nitroGLYCERIN (NITROSTAT) 0.3 MG SL tablet Place 0.3 mg under the tongue every 5 (five) minutes as needed for chest pain.   Unknown   nystatin powder Apply 1 application topically 3 (three) times daily. 15 g 2 Unknown   pantoprazole (PROTONIX) 40 MG tablet Take 1 tablet (40 mg total) by mouth daily. 90 tablet 3 Unknown   Probiotic Product (PROBIOTIC ADVANCED PO) Take 1 capsule by mouth at bedtime.   Unknown   tamsulosin (FLOMAX) 0.4 MG CAPS capsule Take 0.8 mg by mouth at bedtime.   Unknown   traZODone (DESYREL) 100 MG tablet Take 100 mg by mouth at bedtime.   Unknown   valsartan (DIOVAN) 160 MG tablet Take 1 tablet (160 mg total) by mouth daily. 90 tablet 3 Unknown   vitamin B-12 (CYANOCOBALAMIN) 1000 MCG tablet Take 1,000 mcg by mouth daily.   Unknown   Wheat Dextrin (BENEFIBER DRINK MIX PO) Take by mouth daily.   Unknown    Assessment: 77 y.o. M presents with SOB, CP. To begin heparin for ACS. Pt on apixaban pta for afib, last dose 6/24 a.m. CBC ok on admission. Apixaban will be affecting heparin level so will utilize aPTT for monitoring until levels correlate.  Initial aPTT is therapeutic at 95 seconds, heparin level altered by DOAC.  Goal of Therapy:  Heparin level 0.3-0.7 units/ml aPTT 66-102 seconds Monitor platelets by anticoagulation protocol: Yes   Plan:  Continue heparin 1500 units/h Daily aPTT, heparin level, CBC  Fredonia Highland, PharmD, [place], [facility] Clinical Pharmacist [phone] Please check AMION for all [facility] numbers 06/09/2023

## 2023-06-09 NOTE — Progress Notes (Signed)
   Heart Failure Stewardship Pharmacist Progress Note   PCP: [place], [name] G, MD PCP-Cardiologist: Jodelle Red, MD    HPI:  77 yo M with PMH of afib, CAD s/p CABG, HTN, HLD, OSA, CKD, T2DM, and pancreatic cancer in remission.   Was seen in cardiology office on 6/24 complaining of shortness of breath and chest discomfort. Was partially relieved by nitroglycerin. Reported 10 lb weight gain over the last month. Referred to the ED for hospital admission for NSTEMI and acute on chronic HFpEF. BNP elevated. CXR with cardiomegaly and pulmonary vascular congestion. ECHO 6/25 showed LVEF 50-55% (60-65% in 02/2021), global hypokinesis, mild concentric LVH. Scheduled for LHC today.   Current HF Medications: Diuretic: furosemide 40 mg IV x 1 Beta Blocker: metoprolol tartrate 12.5 mg BID  Prior to admission HF Medications: Diuretic: furosemide 20 mg daily ACE/ARB/ARNI: valsartan 160 mg daily Other: hydralazine 50 mg TID  Pertinent Lab Values: Serum creatinine 1.54, BUN 26, Potassium 3.8, Sodium 139, BNP 347.1, Magnesium 1.9, A1c 7.5   Vital Signs: Weight: 263 lbs (admission weight: 264 lbs) Blood pressure: 130/70s  Heart rate: 50s  I/O: incomplete  Medication Assistance / Insurance Benefits Check: Does the patient have prescription insurance?  Yes Type of insurance plan: Tricare  Outpatient Pharmacy:  Prior to admission outpatient pharmacy: [place], [place] Is the patient willing to use [facility] TOC pharmacy at discharge? Yes Is the patient willing to transition their outpatient pharmacy to utilize a [facility] outpatient pharmacy?   No    Assessment: 1. 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 NYHA class III symptoms. - Furosemide 40 mg IV x 1 today. Holding further doses until cath today. - Continue metoprolol 12.5 mg BID - Holding PTA valsartan with AKI. Monitor renal function for resuming. - Consider adding spironolactone and Jardiance pending renal  function improvement. - Patient reports urinary retention around procedures. May have foley placement for cath. If foley placed, hold off initiating SGLT2i until foley removed.    Plan: 1) Medication changes recommended at this time: - None pending cath today  2) Patient assistance: - Has Tricare for insurance  3)  Education  - To be completed prior to discharge  Sharen Hones, PharmD, BCPS Heart Failure Engineer, building services Phone [phone]

## 2023-06-10 ENCOUNTER — Encounter (HOSPITAL_COMMUNITY): Payer: Self-pay | Admitting: Internal Medicine

## 2023-06-10 ENCOUNTER — Other Ambulatory Visit: Payer: Self-pay | Admitting: Cardiology

## 2023-06-10 ENCOUNTER — Other Ambulatory Visit (HOSPITAL_COMMUNITY): Payer: Self-pay

## 2023-06-10 ENCOUNTER — Encounter: Payer: Self-pay | Admitting: Oncology

## 2023-06-10 DIAGNOSIS — I214 Non-ST elevation (NSTEMI) myocardial infarction: Secondary | ICD-10-CM

## 2023-06-10 DIAGNOSIS — I5033 Acute on chronic diastolic (congestive) heart failure: Secondary | ICD-10-CM | POA: Diagnosis not present

## 2023-06-10 DIAGNOSIS — N179 Acute kidney failure, unspecified: Secondary | ICD-10-CM

## 2023-06-10 LAB — CBC
HCT: 39.1 % (ref 39.0–52.0)
Hemoglobin: 13 g/dL (ref 13.0–17.0)
MCH: 33.2 pg (ref 26.0–34.0)
MCHC: 33.2 g/dL (ref 30.0–36.0)
MCV: 99.7 fL (ref 80.0–100.0)
Platelets: 255 10*3/uL (ref 150–400)
RBC: 3.92 MIL/uL — ABNORMAL LOW (ref 4.22–5.81)
RDW: 13.8 % (ref 11.5–15.5)
WBC: 8.2 10*3/uL (ref 4.0–10.5)
nRBC: 0 % (ref 0.0–0.2)

## 2023-06-10 LAB — BASIC METABOLIC PANEL
Anion gap: 16 — ABNORMAL HIGH (ref 5–15)
BUN: 27 mg/dL — ABNORMAL HIGH (ref 8–23)
CO2: 23 mmol/L (ref 22–32)
Calcium: 9.3 mg/dL (ref 8.9–10.3)
Chloride: 100 mmol/L (ref 98–111)
Creatinine, Ser: 1.78 mg/dL — ABNORMAL HIGH (ref 0.61–1.24)
GFR, Estimated: 39 mL/min — ABNORMAL LOW (ref >60)
Glucose, Bld: 154 mg/dL — ABNORMAL HIGH (ref 70–99)
Potassium: 4.6 mmol/L (ref 3.5–5.1)
Sodium: 139 mmol/L (ref 135–145)

## 2023-06-10 LAB — GLUCOSE, CAPILLARY
Glucose-Capillary: 73 mg/dL (ref 70–99)
Glucose-Capillary: 187 mg/dL — ABNORMAL HIGH (ref 70–99)

## 2023-06-10 LAB — LIPOPROTEIN A (LPA): Lipoprotein (a): 25.6 nmol/L (ref <75.0)

## 2023-06-10 MED ORDER — HEPARIN SOD (PORK) LOCK FLUSH 100 UNIT/ML IV SOLN
500.0000 [IU] | INTRAVENOUS | Status: AC | PRN
  Administered 2023-06-10: 500 [IU]

## 2023-06-10 MED ORDER — EMPAGLIFLOZIN 10 MG PO TABS
10.0000 mg | ORAL_TABLET | Freq: Every day | ORAL | Status: DC
  Administered 2023-06-10: 10 mg via ORAL
  Filled 2023-06-10: qty 1

## 2023-06-10 MED ORDER — EMPAGLIFLOZIN 10 MG PO TABS
10.0000 mg | ORAL_TABLET | Freq: Every day | ORAL | 3 refills | Status: DC
  Filled 2023-06-10: qty 30, 30d supply, fill #0

## 2023-06-10 MED ORDER — METOPROLOL TARTRATE 25 MG PO TABS
12.5000 mg | ORAL_TABLET | Freq: Two times a day (BID) | ORAL | 3 refills | Status: DC
  Filled 2023-06-10: qty 30, 30d supply, fill #0

## 2023-06-10 MED ORDER — ASPIRIN 81 MG PO TBEC
81.0000 mg | DELAYED_RELEASE_TABLET | Freq: Every day | ORAL | 3 refills | Status: DC
  Filled 2023-06-10: qty 120, 120d supply, fill #0

## 2023-06-10 MED ORDER — FUROSEMIDE 20 MG PO TABS: 40.0000 mg | ORAL_TABLET | Freq: Every day | ORAL | 3 refills | Status: DC

## 2023-06-10 MED ORDER — INSULIN ASPART 100 UNIT/ML IJ SOLN: 0.0000 [IU] | Freq: Three times a day (TID) | INTRAMUSCULAR | Status: DC

## 2023-06-10 NOTE — Progress Notes (Signed)
CARDIAC REHAB PHASE I   Pt sitting in chair, feeling well. Pt has complaint of right hip/back pain, states he has reported to MD. Pt ambulated from chair to bathroom and bathroom to bed independently tolerating well with no SOB,CP or dizziness. Pt declined ambulation in hall, due to hip/ back pain. Post MI/cath education including MI booklet, exercise guidelines, NTG use, risk factors, restrictions, site care, heart healthy diabetic low sodium diet and CRP2 reviewed. All questions and concerns addressed. Will refer to [facility] for CRP2. Plan for possible discharge home today.   [id number]  Woodroe Chen, RN BSN 06/10/2023 9:13 AM

## 2023-06-10 NOTE — TOC Transition Note (Addendum)
Transition of Care [facility]) - CM/SW Discharge Note   Patient Details  Name: [name] MRN: [id number] Date of Birth: 04-Mar-1946  [facility] of Care [facility] CM/SW Contact:  Leone Haven, RN Phone Number: 06/10/2023, 10:59 AM   Clinical Narrative:    Patient is for possible dc today, awaiting clearance from Cards. Possible follow up with [facility]. Wife will transport him home. Holding diuresing today, and monitoring kideny function so may not dc today.     Barriers to Discharge: Continued Medical Work up   Patient Goals and CMS Choice   Choice offered to / list presented to : NA  Discharge Placement                         Discharge Plan and Services Additional resources added to the After Visit Summary for   In-house Referral: NA Discharge Planning Services: CM Consult Post Acute Care Choice: NA          DME Arranged: N/A DME Agency: NA       HH Arranged: NA          Social Determinants of Health (SDOH) Interventions SDOH Screenings   Food Insecurity: No Food Insecurity (06/09/2023)  Housing: Low Risk  (06/09/2023)  Transportation Needs: No Transportation Needs (06/09/2023)  Utilities: Not At Risk (06/09/2023)  Alcohol Screen: Low Risk  (06/10/2023)  Depression (PHQ2-9): Low Risk  (12/16/2022)  Recent Concern: Depression (PHQ2-9) - Medium Risk (12/10/2022)  Financial Resource Strain: Low Risk  (06/10/2023)  Physical Activity: Insufficiently Active (12/16/2022)  Social Connections: Socially Integrated (12/16/2022)  Stress: No Stress Concern Present (12/16/2022)  Tobacco Use: Medium Risk (06/10/2023)     Readmission Risk Interventions    02/07/2022    9:58 AM 09/26/2021    2:17 PM  Readmission Risk Prevention Plan  Transportation Screening Complete Complete  PCP or Specialist Appt within 3-5 Days Complete Complete  HRI or Home Care Consult Complete Complete  Social Work Consult for Recovery Care Planning/Counseling Complete Complete   Palliative Care Screening Not Applicable Not Applicable  Medication Review Oceanographer) Complete Complete

## 2023-06-10 NOTE — Progress Notes (Signed)
Checking renal function. To be done on 06/17/2023 before follow up visit.

## 2023-06-10 NOTE — Discharge Summary (Signed)
Discharge Summary    Patient ID: [name] MRN: [id number]; DOB: 26-Sep-1946  Admit date: 06/08/2023 Discharge date: 06/10/2023  PCP:  [place], [name] G, MD    [facility] Providers Cardiologist:  Jodelle Red, MD   {   Discharge Diagnoses    Principal Problem:   Non-ST elevation (NSTEMI) myocardial infarction [facility]) Active Problems:   DM (diabetes mellitus), type 2 with renal complications (HCC)   Hypertension with heart disease   CKD (chronic kidney disease), stage III (HCC)   Hx of CABG   Dyslipidemia, goal LDL below 70   Atrial fibrillation (HCC)   OSA (obstructive sleep apnea)   Atherosclerosis of aorta (HCC)   PAD (peripheral artery disease) (HCC)   Unstable angina (HCC)   Acute on chronic heart failure with preserved ejection fraction [facility])    Diagnostic Studies/Procedures    Left heart catheterization 06/09/2023    Prox Graft to Mid Graft lesion is 40% stenosed.   Ost RCA to Prox RCA lesion is 100% stenosed.   Origin to Prox Graft lesion is 100% stenosed.   Ramus lesion is 80% stenosed.   Dist LM lesion is 80% stenosed.   Ost LAD lesion is 100% stenosed.   Prox Cx to Mid Cx lesion is 100% stenosed.   1.  Patent LIMA to LAD and vein graft to PDA; the vein graft to ramus is now occluded. 2.  Severe native vessel disease with occluded ostial LAD, occluded proximal left circumflex and known occluded ostial right coronary artery (not imaged on this study). 3.  Elevated LVEDP of 28 to 30 mmHg across the respiratory cycle.   Recommendation: Medical therapy.   Echocardiogram 06/09/2023  1. Left ventricular ejection fraction, by estimation, is 50 to 55%. The  left ventricle has low normal function. The left ventricle demonstrates  global hypokinesis. There is mild concentric left ventricular hypertrophy  of the inferior segment. Left  ventricular diastolic function could not be evaluated.   2. Right ventricular systolic function was not  well visualized. The right  ventricular size is not well visualized. Tricuspid regurgitation signal is  inadequate for assessing PA pressure.   3. The mitral valve is normal in structure. No evidence of mitral valve  regurgitation. No evidence of mitral stenosis.   4. The aortic valve was not well visualized. Aortic valve regurgitation  is not visualized. No aortic stenosis is present.   5. The inferior vena cava is dilated in size with <50% respiratory  variability, suggesting right atrial pressure of 15 mmHg.   _____________   History of Present Illness     [name] is a 77 y.o. 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   Patient was seen in the office by Dr. Cristal Deer on 06/08/2023 urgently for new onset complaints of shortness of breath and chest pain.  He was extremely tachypneic in office and had noted bilateral arm pressure intermittently for the past few weeks with some relief with nitroglycerin.  Also noted 10 pound weight gain over the last month with increased peripheral edema.  Given symptoms and previously hx of CABG patient was advised to go to the ER.  On arrival to the emergency room EKG did not note any acute ischemic changes.  Troponins were elevated and eventually peaked at 201.  Given such, patient underwent cardiac catheterization on 06/09/2023 for  NSTEMI  Hospital Course     Consultants:    NSTEMI Patient presenting with chest pain and radiation to the arms and shortness of breath and underwent cardiac catheterization yesterday 06/09/2023 that showed patent LIMA to LAD vein graft to PDA.  Vein graft to ramus occluded.  He had severe disease of the ostial LAD and had an occluded proximal left circumflex and known occluded ostial right coronary artery, with plans to medically manage.   Continue  aggressive secondary management Continue aspirin 81 mg daily, atorvastatin 40 mg daily, Lopressor 12.5 mg twice daily.   Acute on chronic HFpEF BNP 347, reporting 10 pound weight gain on admission.  Echocardiogram shows LVEF of 50 to 55% with global hypokinesis.  Slight reduction from 60- 65% in 2022.  Vascular congestion noted both on chest x-ray, echocardiogram, elevated EDP.  He has been on IV lasix 40mg  daily. Have started Jardiance 10mg  Pressures have been overall soft limiting GDMT.  Currently on amlodipine 5 mg here.  Will discontinue valsartan and hydralazine given AKI and normotensive.  Will check BMET within a week and defer outpatient whether to restart other antihypertensives. On lasix 20mg  at home, will DC on 40mg . BP generally well controlled here.    Paroxysmal atrial fibrillation Maintaining normal sinus rhythm here with heart rates in the 50s and 60s. Continue Eliquis 5 mg twice daily, Lopressor 12.5 mg twice daily   Hyperlipidemia LDL 47, well-controlled on atorvastatin 40 mg daily    Diabetes mellitus 2 on insulin at home On SSI.  A1c 7.5%    Patient has been seen by Dr. Excell Seltzer and myself.  Cath site has has been evaluated and free of acute complications.  Follow-up has been scheduled.  Did the patient have an acute coronary syndrome (MI, NSTEMI, STEMI, etc) this admission?:  Yes                               AHA/ACC Clinical Performance & Quality Measures: Aspirin prescribed? - Yes ADP Receptor Inhibitor (Plavix/Clopidogrel, Brilinta/Ticagrelor or Effient/Prasugrel) prescribed (includes medically managed patients)? - No - no intervention Beta Blocker prescribed? - Yes High Intensity Statin (Lipitor 40-80mg  or Crestor 20-40mg ) prescribed? - Yes EF assessed during THIS hospitalization? - Yes For EF <40%, was ACEI/ARB prescribed? - Not Applicable (EF >/= 40%) For EF <40%, Aldosterone Antagonist (Spironolactone or Eplerenone) prescribed? - Not Applicable (EF >/=  40%) Cardiac Rehab Phase II ordered (including medically managed patients)? - Yes         _____________  Discharge Vitals Blood pressure 132/75, pulse (!) 52, temperature 98.5 F (36.9 C), temperature source Oral, resp. rate 18, height 5\' 11"  (1.803 m), weight 118.3 kg, SpO2 99 %.  Filed Weights   06/08/23 1025 06/09/23 0423 06/10/23 0303  Weight: 119.7 kg 119.7 kg 118.3 kg    Labs & Radiologic Studies    CBC Recent Labs    06/08/23 2244 06/10/23 0837  WBC 13.3* 8.2  NEUTROABS 8.4*  --   HGB 12.1* 13.0  HCT 35.8* 39.1  MCV 96.2 99.7  PLT 258 255   Basic Metabolic Panel Recent Labs    16/10/96 2244 06/09/23 1021 06/10/23 0837  NA 139 139 139  K 3.3* 3.8 4.6  CL 103 105 100  CO2 23 26 23   GLUCOSE 132* 70 154*  BUN 25* 26* 27*  CREATININE 1.29* 1.54* 1.78*  CALCIUM 8.9 8.9 9.3  MG 1.9  --   --  Liver Function Tests Recent Labs    06/08/23 2244  AST 25  ALT 18  ALKPHOS 87  BILITOT 0.5  PROT 6.8  ALBUMIN 3.1*   No results for input(s): "LIPASE", "AMYLASE" in the last 72 hours. High Sensitivity Troponin:   Recent Labs  Lab 06/08/23 1021 06/08/23 1220 06/08/23 2244 06/09/23 1021  TROPONINIHS 32* 43* 170* 201*    BNP Invalid input(s): "POCBNP" D-Dimer No results for input(s): "DDIMER" in the last 72 hours. Hemoglobin A1C Recent Labs    06/09/23 0636  HGBA1C 7.5*   Fasting Lipid Panel Recent Labs    06/09/23 0636  CHOL 95  HDL 40*  LDLCALC 47  TRIG 42  CHOLHDL 2.4   Thyroid Function Tests Recent Labs    06/08/23 2246  TSH 2.210   _____________  CARDIAC CATHETERIZATION  Addendum Date: 06/10/2023     Prox Graft to Mid Graft lesion is 40% stenosed.   Ost RCA to Prox RCA lesion is 100% stenosed.   Origin to Prox Graft lesion is 100% stenosed.   Ramus lesion is 80% stenosed.   Dist LM lesion is 80% stenosed.   Ost LAD lesion is 100% stenosed.   Prox Cx to Mid Cx lesion is 100% stenosed. 1.  Patent LIMA to LAD and vein graft to PDA;  the vein graft to ramus is now occluded. 2.  Severe native vessel disease with occluded ostial LAD, occluded proximal left circumflex and known occluded ostial right coronary artery (not imaged on this study). 3.  Elevated LVEDP of 28 to 30 mmHg across the respiratory cycle. Recommendation: Medical therapy.  Addendum Date: 06/10/2023     Prox Graft to Mid Graft lesion is 40% stenosed.   Ost RCA to Prox RCA lesion is 100% stenosed.   Origin to Prox Graft lesion is 100% stenosed.   Ramus lesion is 80% stenosed.   Dist LM lesion is 80% stenosed.   Ost LAD lesion is 100% stenosed.   Prox Cx to Mid Cx lesion is 100% stenosed. 1.  Patent LIMA to LAD and vein graft to PDA; the vein graft to ramus is now occluded. 2.  Severe native vessel disease with occluded ostial LAD, occluded proximal left circumflex and known occluded ostial right coronary artery (not imaged on this study). 3.  Elevated LVEDP of 28 to 30 mmHg across the respiratory cycle. Recommendation: Medical therapy.  Result Date: 06/10/2023   Prox Graft to Mid Graft lesion is 40% stenosed.   Ost RCA to Prox RCA lesion is 100% stenosed.   Origin to Prox Graft lesion is 100% stenosed.   Ramus lesion is 80% stenosed.   Dist LM lesion is 80% stenosed.   Ost LAD lesion is 100% stenosed.   Prox Cx to Mid Cx lesion is 100% stenosed. 1.  Patent LIMA to LAD and vein graft to PDA; the vein graft to ramus is now occluded. 2.  Severe native vessel disease with occluded ostial LAD, occluded proximal left circumflex and known occluded ostial right coronary artery (not imaged on this study). 3.  Elevated LVEDP of 20 to 30 mmHg across the respiratory cycle. Recommendation: Medical therapy.   ECHOCARDIOGRAM COMPLETE  Result Date: 06/09/2023    ECHOCARDIOGRAM REPORT   Patient Name:   [name] [facility] Date of Exam: 06/09/2023 Medical Rec #:  [id number]             Height:       71.0 in Accession #:    [id number]  Weight:       263.9 lb Date of Birth:   1946/03/11            BSA:          2.372 m Patient Age:    73 years              BP:           111/70 mmHg Patient Gender: M                     HR:           56 bpm. Exam Location:  Inpatient Procedure: 2D Echo, Cardiac Doppler and Color Doppler Indications:    STEMI  History:        Patient has prior history of Echocardiogram examinations, most                 recent 03/04/2021. CAD and Angina, Prior CABG, PAD; Risk                 Factors:Hypertension, Diabetes and Dyslipidemia.  Sonographer:    Lucendia Herrlich Referring Phys: [id number] BELAL A SULEIMAN IMPRESSIONS  1. Left ventricular ejection fraction, by estimation, is 50 to 55%. The left ventricle has low normal function. The left ventricle demonstrates global hypokinesis. There is mild concentric left ventricular hypertrophy of the inferior segment. Left ventricular diastolic function could not be evaluated.  2. Right ventricular systolic function was not well visualized. The right ventricular size is not well visualized. Tricuspid regurgitation signal is inadequate for assessing PA pressure.  3. The mitral valve is normal in structure. No evidence of mitral valve regurgitation. No evidence of mitral stenosis.  4. The aortic valve was not well visualized. Aortic valve regurgitation is not visualized. No aortic stenosis is present.  5. The inferior vena cava is dilated in size with <50% respiratory variability, suggesting right atrial pressure of 15 mmHg. FINDINGS  Left Ventricle: Left ventricular ejection fraction, by estimation, is 50 to 55%. The left ventricle has low normal function. The left ventricle demonstrates global hypokinesis. The left ventricular internal cavity size was normal in size. There is mild concentric left ventricular hypertrophy of the inferior segment. Left ventricular diastolic function could not be evaluated. Right Ventricle: The right ventricular size is not well visualized. Right vetricular wall thickness was not assessed. Right  ventricular systolic function was not well visualized. Tricuspid regurgitation signal is inadequate for assessing PA pressure. Left Atrium: Left atrial size was not assessed. Right Atrium: Right atrial size was not assessed. Pericardium: There is no evidence of pericardial effusion. Mitral Valve: The mitral valve is normal in structure. No evidence of mitral valve regurgitation. No evidence of mitral valve stenosis. Tricuspid Valve: The tricuspid valve is not well visualized. Tricuspid valve regurgitation is not demonstrated. No evidence of tricuspid stenosis. Aortic Valve: The aortic valve was not well visualized. Aortic valve regurgitation is not visualized. No aortic stenosis is present. Pulmonic Valve: The pulmonic valve was not assessed. Pulmonic valve regurgitation is not visualized. No evidence of pulmonic stenosis. Aorta: The aortic root is normal in size and structure. Venous: The inferior vena cava is dilated in size with less than 50% respiratory variability, suggesting right atrial pressure of 15 mmHg. IAS/Shunts: The interatrial septum was not assessed.  LEFT VENTRICLE PLAX 2D LVIDd:         5.50 cm LVIDs:         4.00 cm LV PW:  1.20 cm LV IVS:        0.90 cm LVOT diam:     1.90 cm LVOT Area:     2.84 cm  IVC IVC diam: 2.35 cm LEFT ATRIUM         Index LA diam:    4.20 cm 1.77 cm/m   AORTA Ao Root diam: 3.40 cm Ao Asc diam:  3.10 cm  SHUNTS Systemic Diam: 1.90 cm Kardie Tobb DO Electronically signed by Thomasene Ripple DO Signature Date/Time: 06/09/2023/3:26:17 PM    Final    DG Chest Port 1 View  Result Date: 06/08/2023 CLINICAL DATA:  SOB (shortness of breath) EXAM: PORTABLE CHEST 1 VIEW COMPARISON:  Chest x-ray February 06, 2022. FINDINGS: Right IJ approach Port-A-Cath with the tip projecting near the superior cavoatrial junction. Enlarged cardiac silhouette, likely accentuated by technique. Median sternotomy. Pulmonary vascular congestion. No visible pleural effusions or pneumothorax. No  consolidation. IMPRESSION: Cardiomegaly and pulmonary vascular congestion without overt pulmonary edema. Electronically Signed   By: Feliberto Harts M.D.   On: 06/08/2023 10:37   Disposition   Pt is being discharged home today in good condition.  Follow-up Plans & Appointments     Follow-up Information     [facility]. Go in 12 day(s).   Specialty: Cardiology Why: Hospotal follow up 06/22/2023 @ 10 am PLEASE bring a current medication list to appointment FREE valet parking, Entrance C, off [employer] information: [address] [id number] mc [place] [address] [phone]        Ronney Asters, NP Follow up.   Specialty: Cardiology Why: Friday Jun 19, 2023 Arrive by 9:50 AMAppt at 10:05 AM (25 min) Contact information: [address] [place] [address] [phone]                Discharge Instructions     Amb Referral to Cardiac Rehabilitation   Complete by: As directed    Diagnosis: NSTEMI   After initial evaluation and assessments completed: Virtual Based Care may be provided alone or in conjunction with Phase 2 Cardiac Rehab based on patient barriers.: Yes   Intensive Cardiac Rehabilitation (ICR) [facility] location only OR Traditional Cardiac Rehabilitation (TCR) *If criteria for ICR are not met will enroll in TCR [facility] only): Yes   Diet - low sodium heart healthy   Complete by: As directed    Discharge instructions   Complete by: As directed    Radial Site Care Refer to this sheet in the next few weeks. These instructions provide you with information on caring for yourself after your procedure. Your caregiver may also give you more specific instructions. Your treatment has been planned according to current medical practices, but problems sometimes occur. Call your caregiver if you have any problems or questions after your procedure. HOME CARE INSTRUCTIONS You may shower  the day after the procedure. Remove the bandage (dressing) and gently wash the site with plain soap and water. Gently pat the site dry.  Do not apply powder or lotion to the site.  Do not submerge the affected site in water for 3 to 5 days.  Inspect the site at least twice daily.  Do not flex or bend the affected arm for 24 hours.  No lifting over 5 pounds (2.3 kg) for 5 days after your procedure.  Do not drive home if you are discharged the same day of the procedure. Have someone else drive you.  You may drive  24 hours after the procedure unless otherwise instructed by your caregiver.  What to expect: Any bruising will usually fade within 1 to 2 weeks.  Blood that collects in the tissue (hematoma) may be painful to the touch. It should usually decrease in size and tenderness within 1 to 2 weeks.  SEEK IMMEDIATE MEDICAL CARE IF: You have unusual pain at the radial site.  You have redness, warmth, swelling, or pain at the radial site.  You have drainage (other than a small amount of blood on the dressing).  You have chills.  You have a fever or persistent symptoms for more than 72 hours.  You have a fever and your symptoms suddenly get worse.  Your arm becomes pale, cool, tingly, or numb.  You have heavy bleeding from the site. Hold pressure on the site.   Increase activity slowly   Complete by: As directed         Discharge Medications   Allergies as of 06/10/2023       Reactions   Keflex [cephalexin] Other (See Comments)   Hallucinations?   Furosemide Other (See Comments)   Unknown   Lisinopril Other (See Comments)   Unknown   Terazosin Other (See Comments)   Unknown        Medication List     STOP taking these medications    clotrimazole-betamethasone cream Commonly known as: LOTRISONE   cycloSPORINE 0.05 % ophthalmic emulsion Commonly known as: RESTASIS   fluticasone 50 MCG/ACT nasal spray Commonly known as: FLONASE   hydrALAZINE 50 MG tablet Commonly  known as: APRESOLINE   lidocaine-prilocaine cream Commonly known as: EMLA   loratadine 10 MG tablet Commonly known as: CLARITIN   nystatin powder Commonly known as: nystatin   triamcinolone cream 0.1 % Commonly known as: KENALOG   valsartan 160 MG tablet Commonly known as: DIOVAN       TAKE these medications    amLODipine 10 MG tablet Commonly known as: NORVASC Take 5 mg by mouth daily.   aspirin EC 81 MG tablet Take 1 tablet (81 mg total) by mouth daily then as directed by provider. Swallow whole. Start taking on: June 11, 2023   atorvastatin 80 MG tablet Commonly known as: LIPITOR Take 40 mg by mouth at bedtime.   BENEFIBER DRINK MIX PO Take 1 Scoop by mouth at bedtime.   calcium-vitamin D 500-200 MG-UNIT tablet Commonly known as: OSCAL WITH D Take 1 tablet by mouth 2 (two) times daily.   CRANBERRY PO Take 1 tablet by mouth in the morning and at bedtime.   cyanocobalamin 1000 MCG tablet Commonly known as: VITAMIN B12 Take 1,000 mcg by mouth daily.   Eliquis 5 MG Tabs tablet Generic drug: apixaban Take 5 mg by mouth 2 (two) times daily.   empagliflozin 10 MG Tabs tablet Commonly known as: JARDIANCE Take 1 tablet (10 mg total) by mouth daily.   ferrous sulfate 325 (65 FE) MG tablet Take 1 tablet (325 mg total) by mouth daily with breakfast.   FLUoxetine 20 MG capsule Commonly known as: PROZAC Take 40 mg by mouth daily.   furosemide 20 MG tablet Commonly known as: LASIX Take 2 tablets (40 mg total) by mouth daily. What changed: how much to take   insulin aspart 100 UNIT/ML injection Commonly known as: novoLOG Inject 3-5 Units into the skin See admin instructions. Inject 3 units at lunch time and 5 units at dinner   insulin glargine 100 UNIT/ML injection Commonly known as: LANTUS Inject  0.45 mLs (45 Units total) into the skin daily. What changed: how much to take   metoprolol tartrate 25 MG tablet Commonly known as: LOPRESSOR Take 0.5  tablets (12.5 mg total) by mouth 2 (two) times daily.   Multi-Vitamins Tabs Take 1 tablet by mouth daily.   nitroGLYCERIN 0.3 MG SL tablet Commonly known as: NITROSTAT Place 0.3 mg under the tongue every 5 (five) minutes as needed for chest pain.   OneTouch Verio test strip Generic drug: glucose blood Use to test blood sugars 1-2 times daily.   pantoprazole 40 MG tablet Commonly known as: Protonix Take 1 tablet (40 mg total) by mouth daily.   PROBIOTIC ADVANCED PO Take 1 capsule by mouth at bedtime.   tamsulosin 0.4 MG Caps capsule Commonly known as: FLOMAX Take 0.8 mg by mouth at bedtime.   traZODone 100 MG tablet Commonly known as: DESYREL Take 100 mg by mouth at bedtime.   TUMS PO Take 2 tablets by mouth as needed (reflux).   TYLENOL 500 MG tablet Generic drug: acetaminophen Take 1,000 mg by mouth in the morning, at noon, and at bedtime.           Outstanding Labs/Studies   Will order BMET to be done in one a week on 06/17/2023. Instructions have been discussed and will write in AVS.  Duration of Discharge Encounter   Greater than 30 minutes including physician time.  Signed, Abagail Kitchens, PA-C 06/10/2023, 1:58 PM

## 2023-06-10 NOTE — Progress Notes (Unsigned)
HPI: [name] is a 77 y.o. male with PMHx significant for CAD previous MI 2018 and history of CABG 2004, paroxysmal atrial fibrillation, hypertension, hyperlipidemia, CKD stage IIIa, diabetes mellitus II, OSA on CPAP who  here today for chronic disease management and hospital follow up.  Last seen on 12/10/22 Since his last visit he has been hospitalized. He was admitted on 06/08/23 and discharged home on 06/10/23. Presented to the ED with SOB and CP. Dx'ed with NSTEMI.  Left cardiac cath: 1.  Patent LIMA to LAD and vein graft to PDA; the vein graft to ramus is now occluded. 2.  Severe native vessel disease with occluded ostial LAD, occluded proximal left circumflex and known occluded ostial right coronary artery (not imaged on this study).  Echo LVEF 50-55% and grade I diastolic dysfunction. Negative for orthopnea,PND,or worsening LE edema. Furosemide increased from 20 mg to 40 mg. Hydralazine and Valsartan discontinued. PAF: He is on Eliquis 5 mg bid and Metoprolol tartrate 25 mg 0.5 tab bid. Home BP's most 130-140's/60-70's, occasional SBP 150's.  On Atorvastatin 80 mg daily.  CKD: Negative for gross hematuria,foam in urine, or decreased urine output. Pending appt with nephrologist. Lab Results  Component Value Date   CREATININE 1.78 (H) 06/10/2023   BUN 27 (H) 06/10/2023   NA 139 06/10/2023   K 4.6 06/10/2023   CL 100 06/10/2023   CO2 23 06/10/2023   Started on Jardiance 10 mg daily. He is on Amlodipine 5 mg daily. Negative for severe/frequent headache, visual changes, chest pain, dyspnea, or focal weakness.  Diabetes Mellitus II: Dx'ed in 2015. He reports blood glucose levels in the low 100s, 130s, and 140s in the morning, with higher readings at night.  Occasional BS's 80's and 200's.  He checks his blood sugar right after taking his insulin and consuming a protein shake for supper  He is on Lantus 45 units daily, Humalog 2 units before dinner  instead bid due to being tired of the needle sticks. Eye exam: 12/2021, has an appt 12/2022. Foot exam: 01/06/22 Negative for polyuria, polydipsia,foot ulcers/trauma.   Peripheral neuropathy, chemo related, with LE's numbness and tingling, stable for years.  Lab Results  Component Value Date   HGBA1C 7.5 (H) 06/09/2023   Right-sided lower back pain radiated to right groin, chronic. He is not seeing ortho at this time. Negative for saddle anesthesia or bowel/bladder dysfunction.  HH was not deemed necessary. Planning on starting cardiac rehab in 2 weeks. He is using a walker for transfer.  Review of Systems  Constitutional:  Positive for fatigue. Negative for chills and fever.  HENT:  Negative for mouth sores and sore throat.   Respiratory:  Negative for cough and wheezing.   Gastrointestinal:  Negative for abdominal pain, nausea and vomiting.  Endocrine: Negative for cold intolerance and heat intolerance.  Genitourinary:  Negative for dysuria.  Musculoskeletal:  Positive for arthralgias and gait problem.  Neurological:  Negative for syncope and facial asymmetry.  See other pertinent positives and negatives in HPI.  Current Outpatient Medications on File Prior to Visit  Medication Sig Dispense Refill   acetaminophen (TYLENOL) 500 MG tablet Take 1,000 mg by mouth in the morning, at noon, and at bedtime.     amLODipine (NORVASC) 10 MG tablet Take 5 mg by mouth daily.     apixaban (ELIQUIS) 5 MG TABS tablet Take 5 mg by mouth 2 (two) times daily.     aspirin EC 81 MG tablet Take 1 tablet (  81 mg total) by mouth daily then as directed by provider. Swallow whole. 120 tablet 3   atorvastatin (LIPITOR) 80 MG tablet Take 40 mg by mouth at bedtime.     Calcium Carbonate Antacid (TUMS PO) Take 2 tablets by mouth as needed (reflux).     calcium-vitamin D (OSCAL WITH D) 500-200 MG-UNIT tablet Take 1 tablet by mouth 2 (two) times daily.     CRANBERRY PO Take 1 tablet by mouth in the morning and  at bedtime.     ferrous sulfate 325 (65 FE) MG tablet Take 1 tablet (325 mg total) by mouth daily with breakfast. 90 tablet 2   FLUoxetine (PROZAC) 20 MG capsule Take 40 mg by mouth daily.     furosemide (LASIX) 20 MG tablet Take 2 tablets (40 mg total) by mouth daily. 90 tablet 3   glucose blood (ONETOUCH VERIO) test strip Use to test blood sugars 1-2 times daily. 200 each 12   insulin aspart (NOVOLOG) 100 UNIT/ML injection Inject 3-5 Units into the skin See admin instructions. Inject 3 units at lunch time and 5 units at dinner     insulin glargine (LANTUS) 100 UNIT/ML injection Inject 0.45 mLs (45 Units total) into the skin daily. (Patient taking differently: Inject 50 Units into the skin daily.) 10 mL 3   Multiple Vitamin (MULTI-VITAMINS) TABS Take 1 tablet by mouth daily.     nitroGLYCERIN (NITROSTAT) 0.3 MG SL tablet Place 0.3 mg under the tongue every 5 (five) minutes as needed for chest pain.     pantoprazole (PROTONIX) 40 MG tablet Take 1 tablet (40 mg total) by mouth daily. 90 tablet 3   Probiotic Product (PROBIOTIC ADVANCED PO) Take 1 capsule by mouth at bedtime.     tamsulosin (FLOMAX) 0.4 MG CAPS capsule Take 0.8 mg by mouth at bedtime.     traZODone (DESYREL) 100 MG tablet Take 100 mg by mouth at bedtime.     vitamin B-12 (CYANOCOBALAMIN) 1000 MCG tablet Take 1,000 mcg by mouth daily.     Wheat Dextrin (BENEFIBER DRINK MIX PO) Take 1 Scoop by mouth at bedtime.     No current facility-administered medications on file prior to visit.    Past Medical History:  Diagnosis Date   A-fib (HCC) 03/04/2021   Anemia    Arthritis    Cancer (HCC)    prostate   Chronic kidney disease    blood in urine    Constipation    Coronary artery disease    Depression    Diabetes mellitus without complication (HCC)    Difficult intubation    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 it was hard to get the tube down his throat   Dyspnea    Family history of breast cancer    Fatty liver    Fatty liver    Frequent  headaches    GERD (gastroesophageal reflux disease)    History of chicken pox    History of fainting spells of unknown cause    History of prostate cancer    Hyperlipidemia    Hypertension    Myocardial infarction (HCC)    Peripheral neuropathy    Pneumonia    Prostate cancer (HCC)    PTSD (post-traumatic stress disorder)    Sleep apnea    uses Cpap   Allergies  Allergen Reactions   Keflex [Cephalexin] Other (See Comments)    Hallucinations?   Furosemide Other (See Comments)    Unknown   Lisinopril Other (See Comments)  Unknown   Terazosin Other (See Comments)    Unknown    Social History   Socioeconomic History   Marital status: Married    Spouse name: Glennis   Number of children: 2   Years of education: Not on file   Highest education level: Associate degree: academic program  Occupational History   Occupation: retired  Tobacco Use   Smoking status: Former    Types: Cigarettes    Quit date: 12/14/1975    Years since quitting: 47.5   Smokeless tobacco: Never  Vaping Use   Vaping Use: Not on file  Substance and Sexual Activity   Alcohol use: Not Currently   Drug use: Never   Sexual activity: Not Currently  Other Topics Concern   Not on file  Social History Narrative   ** Merged History Encounter **       Social Determinants of Health   Financial Resource Strain: Low Risk  (06/11/2023)   Overall Financial Resource Strain (CARDIA)    Difficulty of Paying Living Expenses: Not hard at all  Food Insecurity: No Food Insecurity (06/11/2023)   Hunger Vital Sign    Worried About Running Out of Food in the Last Year: Never true    Ran Out of Food in the Last Year: Never true  Transportation Needs: No Transportation Needs (06/11/2023)   PRAPARE - Administrator, Civil Service (Medical): No    Lack of Transportation (Non-Medical): No  Physical Activity: Inactive (06/11/2023)   Exercise Vital Sign    Days of Exercise per Week: 0 days    Minutes of  Exercise per Session: 60 min  Stress: No Stress Concern Present (06/11/2023)   [employer] of Occupational Health - Occupational Stress Questionnaire    Feeling of Stress : Not at all  Social Connections: Socially Integrated (06/11/2023)   Social Connection and Isolation Panel [NHANES]    Frequency of Communication with Friends and Family: Twice a week    Frequency of Social Gatherings with Friends and Family: More than three times a week    Attends Religious Services: More than 4 times per year    Active Member of [employer] or Organizations: Yes    Attends Banker Meetings: More than 4 times per year    Marital Status: Married    Vitals:   06/12/23 0943  BP: 110/70  Pulse: 62  Resp: 16  SpO2: 97%   Body mass index is 35.76 kg/m.  Physical Exam Nursing note reviewed.  Constitutional:      General: He is not in acute distress.    Appearance: He is well-developed.  HENT:     Head: Normocephalic and atraumatic.     Mouth/Throat:     Mouth: Mucous membranes are moist.  Eyes:     Conjunctiva/sclera: Conjunctivae normal.  Cardiovascular:     Rate and Rhythm: Normal rate and regular rhythm.     Heart sounds: No murmur heard.    Comments: DP pulses present. Trace pitting LE edema, bilateral. Pulmonary:     Effort: Pulmonary effort is normal. No respiratory distress.     Breath sounds: Normal breath sounds.  Abdominal:     Palpations: Abdomen is soft. There is no mass.     Tenderness: There is no abdominal tenderness.  Musculoskeletal:     Left knee: No erythema. Normal range of motion.  Skin:    General: Skin is warm.     Findings: No erythema or rash.  Neurological:  General: No focal deficit present.     Mental Status: He is alert and oriented to person, place, and time.     Cranial Nerves: No cranial nerve deficit.     Comments: Unstable gait, assisted by a walker.  Psychiatric:     Comments: Well groomed, good eye contact.     ASSESSMENT AND  PLAN:  Yug was seen today for medical management of chronic issues.  Diagnoses and all orders for this visit:  Hypertension with heart disease Assessment & Plan: Re-checked 120/60 LUE, BP adequately controlled. Continue amlodipine 5 mg daily, and Metoprolol tartrate 25 mg 0.5 tab bid. Continue monitoring BP regularly and low salt diet.  Orders: -     Metoprolol Tartrate; Take 0.5 tablets (12.5 mg total) by mouth 2 (two) times daily.  Dispense: 45 tablet; Refill: 3  Stage 3a chronic kidney disease (HCC) Assessment & Plan: Cr 1.2-1.4 and e GFR high 40's-50's, last Cr 1.78 and e GFR 39. BMP has been scheduled at his cardiologist office. He got the contact information for nephrologist's office. Continue adequate hydration,low salt diet,and avoidance of NSAID's. Recently Jardiance 10 mg was added.   Non-ST elevation (NSTEMI) myocardial infarction [facility]) Assessment & Plan: Currently on Atorvastatin 80 mg daily, aspirin 81 mg daily, Amlodipine 5 mg daily, and Metoprolol tartrate 12.5 mg bid. Last LDL 66 in 02/2022. Following with cardiologist,next  appt 06/19/23.   Chemotherapy-induced neuropathy (HCC) Assessment & Plan: Diabetic and chemo related, stable. Continue appropriate foot care.   Type 2 diabetes mellitus with stage 3b chronic kidney disease, with long-term current use of insulin (HCC) Assessment & Plan: Last HgA1C 7.5. Continue Lantus 45 U daily and Humalog before lunch instead dinner per sliding scale. Dietary recommendations discussed. Annual eye exam, periodic dental and foot care to continue. F/U in 5-6 months.  Orders: -     Empagliflozin; Take 1 tablet (10 mg total) by mouth daily.  Dispense: 90 tablet; Refill: 3  Right low back pain, unspecified chronicity, unspecified whether sciatica present Topical icy hot or asper cream may help. Tylenol 500 mg 3-4 times per day as needed. He prefers to hold on ortho evaluation.  I spent a total of 52 minutes in both  face to face and non face to face activities for this visit on the date of this encounter. During this time history was obtained and documented, examination was performed, prior labs/imaging reviewed, and assessment/plan discussed.  Return in about 5 months (around 11/12/2023) for chronic problems.  Edna Grover G. [place], MD  [facility]. [place] office.

## 2023-06-10 NOTE — Progress Notes (Signed)
Heart Failure Nurse Navigator Progress Note  PCP: [place], [name] G, MD PCP-Cardiologist: Cristal Deer Admission Diagnosis: Unstable angina. Admitted from: Cardiology office  Presentation:   [name] [facility] presented with shortness of breath from cardiologist office, SOB has lasted a few weeks and become worse. Also with bilateral arm pressure for 2 weeks, weight gain of 10 pounds in last month. BNP 347, Troponin 43, CXR shows cardiomegaly with some pulmonary vascular congestion. EKG with no ischemic changes,    Patient was educated on the sign and symptoms of heart failure, daily weights, when to call his doctor or go to the ED, diet/ fluid restrictions, taking all medications as prescribed and attending all medical appointments. Patient verbalized his understanding, a HF TOC appointment was scheduled for 06/22/2023 @ 10 am.   ECHO/ LVEF: 50-55%  Clinical Course:  Past Medical History:  Diagnosis Date   A-fib (HCC) 03/04/2021   Anemia    Arthritis    Cancer (HCC)    prostate   Chronic kidney disease    blood in urine    Constipation    Coronary artery disease    Depression    Diabetes mellitus without complication (HCC)    Difficult intubation    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 it was hard to get the tube down his throat   Dyspnea    Family history of breast cancer    Fatty liver    Fatty liver    Frequent headaches    GERD (gastroesophageal reflux disease)    History of chicken pox    History of fainting spells of unknown cause    History of prostate cancer    Hyperlipidemia    Hypertension    Myocardial infarction (HCC)    Peripheral neuropathy    Pneumonia    Prostate cancer (HCC)    PTSD (post-traumatic stress disorder)    Sleep apnea    uses Cpap     Social History   Socioeconomic History   Marital status: Married    Spouse name: Glennis   Number of children: 2   Years of education: Not on file   Highest education level: Not on file  Occupational History    Occupation: retired  Tobacco Use   Smoking status: Former    Types: Cigarettes    Quit date: 12/14/1975    Years since quitting: 47.5   Smokeless tobacco: Never  Vaping Use   Vaping Use: Not on file  Substance and Sexual Activity   Alcohol use: Not Currently   Drug use: Never   Sexual activity: Not Currently  Other Topics Concern   Not on file  Social History Narrative   ** Merged History Encounter **       Social Determinants of Health   Financial Resource Strain: Low Risk  (06/10/2023)   Overall Financial Resource Strain (CARDIA)    Difficulty of Paying Living Expenses: Not hard at all  Food Insecurity: No Food Insecurity (06/09/2023)   Hunger Vital Sign    Worried About Running Out of Food in the Last Year: Never true    Ran Out of Food in the Last Year: Never true  Transportation Needs: No Transportation Needs (06/09/2023)   PRAPARE - Administrator, Civil Service (Medical): No    Lack of Transportation (Non-Medical): No  Physical Activity: Insufficiently Active (12/16/2022)   Exercise Vital Sign    Days of Exercise per Week: 2 days    Minutes of Exercise per Session: 60 min  Stress:  No Stress Concern Present (12/16/2022)   [employer] of Occupational Health - Occupational Stress Questionnaire    Feeling of Stress : Not at all  Social Connections: Socially Integrated (12/16/2022)   Social Connection and Isolation Panel [NHANES]    Frequency of Communication with Friends and Family: More than three times a week    Frequency of Social Gatherings with Friends and Family: More than three times a week    Attends Religious Services: More than 4 times per year    Active Member of [employer] or Organizations: Yes    Attends Engineer, structural: More than 4 times per year    Marital Status: Married   Water engineer and Provision:  Detailed education and instructions provided on heart failure disease management including the following:  Signs and  symptoms of Heart Failure When to call the physician Importance of daily weights Low sodium diet Fluid restriction Medication management Anticipated future follow-up appointments  Patient education given on each of the above topics.  Patient acknowledges understanding via teach back method and acceptance of all instructions.  Education Materials:  "Living Better With Heart Failure" Booklet, HF zone tool, & Daily Weight Tracker Tool.  Patient has scale at home: yes Patient has pill box at home: NA    High Risk Criteria for Readmission and/or Poor Patient Outcomes: Heart failure hospital admissions (last 6 months): 0  No Show rate: 3 % Difficult social situation: No Demonstrates medication adherence: Yes Primary Language: English Literacy level: reading, writing, and comprehension  Barriers of Care:   Diet/ fluid restrictions Daily weights  Considerations/Referrals:   Referral made to Heart Failure Pharmacist Stewardship: yes Referral made to Heart Failure CSW/NCM TOC: No Referral made to Heart & Vascular TOC clinic: Yes, 06/22/2023 @ 10 am.   Items for Follow-up on DC/TOC: Continued HF education Diet/ fluid restrictions/ daily weights   Rhae Hammock, BSN, RN Heart Failure Teacher, adult education Only

## 2023-06-10 NOTE — Progress Notes (Signed)
   Heart Failure Stewardship Pharmacist Progress Note   PCP: [place], [name] G, MD PCP-Cardiologist: Jodelle Red, MD    HPI:  77 yo M with PMH of afib, CAD s/p CABG, HTN, HLD, OSA, CKD, T2DM, and pancreatic cancer in remission.   Was seen in cardiology office on 6/24 complaining of shortness of breath and chest discomfort. Was partially relieved by nitroglycerin. Reported 10 lb weight gain over the last month. Referred to the ED for hospital admission for NSTEMI and acute on chronic HFpEF. BNP elevated. CXR with cardiomegaly and pulmonary vascular congestion. ECHO 6/25 showed LVEF 50-55% (60-65% in 02/2021), global hypokinesis, mild concentric LVH. LHC 6/25 showed patent LIMA to LAD and vein graft to PDA. Vein graft to ramus is now occluded. LVEDP 28-30. Medical management recommended.   Current HF Medications: Beta Blocker: metoprolol tartrate 12.5 mg BID  Prior to admission HF Medications: Diuretic: furosemide 20 mg daily ACE/ARB/ARNI: valsartan 160 mg daily Other: hydralazine 50 mg TID  Pertinent Lab Values: Serum creatinine 1.78, BUN 27, Potassium 4.6, Sodium 139, BNP 347.1, Magnesium 1.9, A1c 7.5   Vital Signs: Weight: 260 lbs (admission weight: 264 lbs) Blood pressure: 100-130/60s  Heart rate: 50s  I/O: incomplete  Medication Assistance / Insurance Benefits Check: Does the patient have prescription insurance?  Yes Type of insurance plan: Tricare  Outpatient Pharmacy:  Prior to admission outpatient pharmacy: [place], [place] Is the patient willing to use [facility] TOC pharmacy at discharge? Yes Is the patient willing to transition their outpatient pharmacy to utilize a [facility] outpatient pharmacy?   No    Assessment: 1. 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 NYHA class III symptoms. - Furosemide 40 mg IV x 1 6/25. LVEDP elevated on cath and complaining of shortness of breath. Consider giving another dose of IV lasix if remains symptomatic.  -  Continue metoprolol 12.5 mg BID - Holding PTA valsartan with AKI. Monitor renal function for resuming. - Consider adding spironolactone pending renal function improvement. - Agree with starting Jardiance 10 mg daily   Plan: 1) Medication changes recommended at this time: - Agree with changes; may need additional dose of IV lasix pending response after Jardiance.  2) Patient assistance: - Has Tricare for insurance  3)  Education  - Patient has been educated on current HF medications and potential additions to HF medication regimen - Patient verbalizes understanding that over the next few months, these medication doses may change and more medications may be added to optimize HF regimen - Patient has been educated on basic disease state pathophysiology and goals of therapy   Sharen Hones, PharmD, BCPS Heart Failure Stewardship Pharmacist Phone [phone]

## 2023-06-10 NOTE — Progress Notes (Addendum)
Rounding Note    Patient Name: [name] Date of Encounter: 06/10/2023  [facility] Cardiologist: Jodelle Red, MD   Subjective   Has some shortness of breath.  Worse this morning but has improved now.  No chest pain  Inpatient Medications    Scheduled Meds:  amLODipine  5 mg Oral Daily   apixaban  5 mg Oral BID   aspirin EC  81 mg Oral Daily   atorvastatin  40 mg Oral QHS   Chlorhexidine Gluconate Cloth  6 each Topical Daily   FLUoxetine  40 mg Oral Daily   fluticasone  1 spray Each Nare Daily   insulin aspart  0-15 Units Subcutaneous TID WC & HS   metoprolol tartrate  12.5 mg Oral BID   pantoprazole  40 mg Oral Daily   sodium chloride flush  3 mL Intravenous Q12H   tamsulosin  0.8 mg Oral QHS   traZODone  100 mg Oral QHS   Continuous Infusions:  sodium chloride     PRN Meds: sodium chloride, acetaminophen, cycloSPORINE, loratadine, nitroGLYCERIN, ondansetron (ZOFRAN) IV, sodium chloride flush   Vital Signs    Vitals:   06/09/23 1626 06/09/23 2004 06/09/23 2348 06/10/23 0303  BP:  (!) 157/71 (!) 119/56 98/60  Pulse:  71 (!) 59 (!) 54  Resp:  17 17 17   Temp:  (!) 97.5 F (36.4 C) (!) 97.5 F (36.4 C) (!) 97.5 F (36.4 C)  TempSrc:  Oral Oral Oral  SpO2: 100% 96% 99% 97%  Weight:    118.3 kg  Height:        Intake/Output Summary (Last 24 hours) at 06/10/2023 0814 Last data filed at 06/10/2023 0500 Gross per 24 hour  Intake 240 ml  Output --  Net 240 ml      06/10/2023    3:03 AM 06/09/2023    4:23 AM 06/08/2023   10:25 AM  Last 3 Weights  Weight (lbs) 260 lb 12.9 oz 263 lb 14.3 oz 264 lb  Weight (kg) 118.3 kg 119.7 kg 119.75 kg      Telemetry    Sinus bradycardia with heart rates in the 50s and 60s- Personally Reviewed  ECG    No new tracings- Personally Reviewed  Physical Exam   GEN: No acute distress.   Neck: No JVD Cardiac: RRR, no murmurs, rubs, or gallops.  Respiratory: Clear to auscultation  bilaterally. GI: Soft, nontender, non-distended  MS: No edema; No deformity. L radial cath site with no signs pf discharge, redness, tenderness Neuro:  Nonfocal  Psych: Normal affect   Labs    High Sensitivity Troponin:   Recent Labs  Lab 06/08/23 1021 06/08/23 1220 06/08/23 2244 06/09/23 1021  TROPONINIHS 32* 43* 170* 201*     Chemistry Recent Labs  Lab 06/08/23 1021 06/08/23 2244 06/09/23 1021  NA 135 139 139  K 3.8 3.3* 3.8  CL 102 103 105  CO2 23 23 26   GLUCOSE 326* 132* 70  BUN 32* 25* 26*  CREATININE 1.43* 1.29* 1.54*  CALCIUM 9.4 8.9 8.9  MG  --  1.9  --   PROT  --  6.8  --   ALBUMIN  --  3.1*  --   AST  --  25  --   ALT  --  18  --   ALKPHOS  --  87  --   BILITOT  --  0.5  --   GFRNONAA 50* 57* 46*  ANIONGAP 10 13 8  Lipids  Recent Labs  Lab 06/09/23 0636  CHOL 95  TRIG 42  HDL 40*  LDLCALC 47  CHOLHDL 2.4    Hematology Recent Labs  Lab 06/08/23 1021 06/08/23 2244  WBC 11.8* 13.3*  RBC 3.62* 3.72*  HGB 11.8* 12.1*  HCT 35.1* 35.8*  MCV 97.0 96.2  MCH 32.6 32.5  MCHC 33.6 33.8  RDW 13.4 13.4  PLT 264 258   Thyroid  Recent Labs  Lab 06/08/23 2246  TSH 2.210    BNP Recent Labs  Lab 06/08/23 1021 06/08/23 2245  BNP 157.1* 347.4*    DDimer No results for input(s): "DDIMER" in the last 168 hours.   Radiology    CARDIAC CATHETERIZATION  Addendum Date: 06/10/2023     Prox Graft to Mid Graft lesion is 40% stenosed.   Ost RCA to Prox RCA lesion is 100% stenosed.   Origin to Prox Graft lesion is 100% stenosed.   Ramus lesion is 80% stenosed.   Dist LM lesion is 80% stenosed.   Ost LAD lesion is 100% stenosed.   Prox Cx to Mid Cx lesion is 100% stenosed. 1.  Patent LIMA to LAD and vein graft to PDA; the vein graft to ramus is now occluded. 2.  Severe native vessel disease with occluded ostial LAD, occluded proximal left circumflex and known occluded ostial right coronary artery (not imaged on this study). 3.  Elevated LVEDP of 28 to  30 mmHg across the respiratory cycle. Recommendation: Medical therapy.  Result Date: 06/10/2023   Prox Graft to Mid Graft lesion is 40% stenosed.   Ost RCA to Prox RCA lesion is 100% stenosed.   Origin to Prox Graft lesion is 100% stenosed.   Ramus lesion is 80% stenosed.   Dist LM lesion is 80% stenosed.   Ost LAD lesion is 100% stenosed.   Prox Cx to Mid Cx lesion is 100% stenosed. 1.  Patent LIMA to LAD and vein graft to PDA; the vein graft to ramus is now occluded. 2.  Severe native vessel disease with occluded ostial LAD, occluded proximal left circumflex and known occluded ostial right coronary artery (not imaged on this study). 3.  Elevated LVEDP of 20 to 30 mmHg across the respiratory cycle. Recommendation: Medical therapy.   ECHOCARDIOGRAM COMPLETE  Result Date: 06/09/2023    ECHOCARDIOGRAM REPORT   Patient Name:   [name] [facility] Date of Exam: 06/09/2023 Medical Rec #:  [id number]             Height:       71.0 in Accession #:    [id number]            Weight:       263.9 lb Date of Birth:  1946-05-04            BSA:          2.372 m Patient Age:    77 years              BP:           111/70 mmHg Patient Gender: M                     HR:           56 bpm. Exam Location:  Inpatient Procedure: 2D Echo, Cardiac Doppler and Color Doppler Indications:    STEMI  History:        Patient has prior history of Echocardiogram examinations, most  recent 03/04/2021. CAD and Angina, Prior CABG, PAD; Risk                 Factors:Hypertension, Diabetes and Dyslipidemia.  Sonographer:    Lucendia Herrlich Referring Phys: [id number] BELAL A SULEIMAN IMPRESSIONS  1. Left ventricular ejection fraction, by estimation, is 50 to 55%. The left ventricle has low normal function. The left ventricle demonstrates global hypokinesis. There is mild concentric left ventricular hypertrophy of the inferior segment. Left ventricular diastolic function could not be evaluated.  2. Right ventricular systolic function was  not well visualized. The right ventricular size is not well visualized. Tricuspid regurgitation signal is inadequate for assessing PA pressure.  3. The mitral valve is normal in structure. No evidence of mitral valve regurgitation. No evidence of mitral stenosis.  4. The aortic valve was not well visualized. Aortic valve regurgitation is not visualized. No aortic stenosis is present.  5. The inferior vena cava is dilated in size with <50% respiratory variability, suggesting right atrial pressure of 15 mmHg. FINDINGS  Left Ventricle: Left ventricular ejection fraction, by estimation, is 50 to 55%. The left ventricle has low normal function. The left ventricle demonstrates global hypokinesis. The left ventricular internal cavity size was normal in size. There is mild concentric left ventricular hypertrophy of the inferior segment. Left ventricular diastolic function could not be evaluated. Right Ventricle: The right ventricular size is not well visualized. Right vetricular wall thickness was not assessed. Right ventricular systolic function was not well visualized. Tricuspid regurgitation signal is inadequate for assessing PA pressure. Left Atrium: Left atrial size was not assessed. Right Atrium: Right atrial size was not assessed. Pericardium: There is no evidence of pericardial effusion. Mitral Valve: The mitral valve is normal in structure. No evidence of mitral valve regurgitation. No evidence of mitral valve stenosis. Tricuspid Valve: The tricuspid valve is not well visualized. Tricuspid valve regurgitation is not demonstrated. No evidence of tricuspid stenosis. Aortic Valve: The aortic valve was not well visualized. Aortic valve regurgitation is not visualized. No aortic stenosis is present. Pulmonic Valve: The pulmonic valve was not assessed. Pulmonic valve regurgitation is not visualized. No evidence of pulmonic stenosis. Aorta: The aortic root is normal in size and structure. Venous: The inferior vena cava is  dilated in size with less than 50% respiratory variability, suggesting right atrial pressure of 15 mmHg. IAS/Shunts: The interatrial septum was not assessed.  LEFT VENTRICLE PLAX 2D LVIDd:         5.50 cm LVIDs:         4.00 cm LV PW:         1.20 cm LV IVS:        0.90 cm LVOT diam:     1.90 cm LVOT Area:     2.84 cm  IVC IVC diam: 2.35 cm LEFT ATRIUM         Index LA diam:    4.20 cm 1.77 cm/m   AORTA Ao Root diam: 3.40 cm Ao Asc diam:  3.10 cm  SHUNTS Systemic Diam: 1.90 cm Kardie Tobb DO Electronically signed by Thomasene Ripple DO Signature Date/Time: 06/09/2023/3:26:17 PM    Final    DG Chest Port 1 View  Result Date: 06/08/2023 CLINICAL DATA:  SOB (shortness of breath) EXAM: PORTABLE CHEST 1 VIEW COMPARISON:  Chest x-ray February 06, 2022. FINDINGS: Right IJ approach Port-A-Cath with the tip projecting near the superior cavoatrial junction. Enlarged cardiac silhouette, likely accentuated by technique. Median sternotomy. Pulmonary vascular congestion. No visible pleural effusions  or pneumothorax. No consolidation. IMPRESSION: Cardiomegaly and pulmonary vascular congestion without overt pulmonary edema. Electronically Signed   By: Feliberto Harts M.D.   On: 06/08/2023 10:37    Cardiac Studies   Left heart catheterization 06/09/2023    Prox Graft to Mid Graft lesion is 40% stenosed.   Ost RCA to Prox RCA lesion is 100% stenosed.   Origin to Prox Graft lesion is 100% stenosed.   Ramus lesion is 80% stenosed.   Dist LM lesion is 80% stenosed.   Ost LAD lesion is 100% stenosed.   Prox Cx to Mid Cx lesion is 100% stenosed.   1.  Patent LIMA to LAD and vein graft to PDA; the vein graft to ramus is now occluded. 2.  Severe native vessel disease with occluded ostial LAD, occluded proximal left circumflex and known occluded ostial right coronary artery (not imaged on this study). 3.  Elevated LVEDP of 28 to 30 mmHg across the respiratory cycle.   Recommendation: Medical therapy.  Echocardiogram  06/09/2023  1. Left ventricular ejection fraction, by estimation, is 50 to 55%. The  left ventricle has low normal function. The left ventricle demonstrates  global hypokinesis. There is mild concentric left ventricular hypertrophy  of the inferior segment. Left  ventricular diastolic function could not be evaluated.   2. Right ventricular systolic function was not well visualized. The right  ventricular size is not well visualized. Tricuspid regurgitation signal is  inadequate for assessing PA pressure.   3. The mitral valve is normal in structure. No evidence of mitral valve  regurgitation. No evidence of mitral stenosis.   4. The aortic valve was not well visualized. Aortic valve regurgitation  is not visualized. No aortic stenosis is present.   5. The inferior vena cava is dilated in size with <50% respiratory  variability, suggesting right atrial pressure of 15 mmHg.   Patient Profile     77 y.o. 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   Assessment & Plan    NSTEMI Patient presenting with chest pain and radiation to the arms and shortness of breath and underwent cardiac catheterization yesterday 06/09/2023 that showed patent LIMA to LAD vein graft to PDA.  Vein graft to ramus occluded.  He had severe disease of the ostial LAD and had an occluded proximal left circumflex and known occluded ostial right coronary artery, with plans to medically manage.  LVEDP 28-30.  Overall looks euvolemic on exam but based off dilated IVC, shortness of breath this morning, elevated LVEDP will need continued diuresis.  Labs are not back this morning but have been ordered.  Continue aggressive secondary management Continue aspirin 81 mg daily, atorvastatin 40 mg daily, Lopressor 12.5 mg twice daily  Acute on chronic HFpEF BNP 347, reporting 10  pound weight gain on admission.  Echocardiogram shows LVEF of 50 to 55% with global hypokinesis.  Slight reduction from 60- 65% in 2022.  Vascular congestion noted both on chest x-ray, echocardiogram, elevated EDP.   Will start Jardiance 10mg  Pressures have been overall soft limiting GDMT.  Currently on amlodipine 5 mg.  PTA valsartan 160mg . Consider stopping amlodipine and continue PTA valsartan at reduced dose once renal function stabilizes.  I's and O's not accurately documented.  On lasix 20mg  at home, likely needs higher DC dose.  Given contrast use yesterday and declining renal function, will defer further diuresis  until tomorrow given clinically euvolemic state.  Paroxysmal atrial fibrillation Maintaining normal sinus rhythm here with heart rates in the 50s and 60s. Continue Eliquis 5 mg twice daily, Lopressor 12.5 mg twice daily  Hyperlipidemia LDL 47, well-controlled on atorvastatin 40 mg daily   Diabetes mellitus 2 on insulin at home On SSI.  A1c 7.5%   For questions or updates, please contact [facility] Please consult [email or web address] for contact info under        Signed, Abagail Kitchens, PA-C  06/10/2023, 8:14 AM     Patient seen, examined. Available data reviewed. Agree with findings, assessment, and plan as outlined by Yvonna Alanis, PA-C.  The patient is independently interviewed and examined.  He is alert, oriented, no distress.  Lungs are clear bilaterally, heart is regular rate and rhythm no murmur gallop, JVP is mildly elevated, abdomen is soft and nontender with no masses, extremities have trace edema.  I agree with the plan as outlined above.  Would increase his loop diuretic to furosemide 40 mg daily.  Agree with the addition of Jardiance.  I think this might help him significantly with his volume.  The patient appears clinically stable for discharge today.  He will need close follow-up of his renal function and I would make sure he has a metabolic panel early next  week.  Cardiac cath images reviewed and he had no targets for PCI with medical therapy recommended for his residual CAD.  Tonny Bollman, M.D. 06/10/2023 12:53 PM

## 2023-06-12 ENCOUNTER — Encounter: Payer: Self-pay | Admitting: Family Medicine

## 2023-06-12 ENCOUNTER — Ambulatory Visit (INDEPENDENT_AMBULATORY_CARE_PROVIDER_SITE_OTHER): Payer: Medicare Other | Admitting: Family Medicine

## 2023-06-12 ENCOUNTER — Inpatient Hospital Stay: Payer: Medicare Other

## 2023-06-12 VITALS — BP 110/70 | HR 62 | Resp 16 | Ht 71.0 in | Wt 256.4 lb

## 2023-06-12 DIAGNOSIS — N1831 Chronic kidney disease, stage 3a: Secondary | ICD-10-CM | POA: Diagnosis not present

## 2023-06-12 DIAGNOSIS — I214 Non-ST elevation (NSTEMI) myocardial infarction: Secondary | ICD-10-CM

## 2023-06-12 DIAGNOSIS — N1832 Chronic kidney disease, stage 3b: Secondary | ICD-10-CM

## 2023-06-12 DIAGNOSIS — G620 Drug-induced polyneuropathy: Secondary | ICD-10-CM

## 2023-06-12 DIAGNOSIS — Z794 Long term (current) use of insulin: Secondary | ICD-10-CM

## 2023-06-12 DIAGNOSIS — Z7984 Long term (current) use of oral hypoglycemic drugs: Secondary | ICD-10-CM | POA: Diagnosis not present

## 2023-06-12 DIAGNOSIS — T451X5A Adverse effect of antineoplastic and immunosuppressive drugs, initial encounter: Secondary | ICD-10-CM

## 2023-06-12 DIAGNOSIS — I119 Hypertensive heart disease without heart failure: Secondary | ICD-10-CM

## 2023-06-12 DIAGNOSIS — G629 Polyneuropathy, unspecified: Secondary | ICD-10-CM

## 2023-06-12 DIAGNOSIS — M545 Low back pain, unspecified: Secondary | ICD-10-CM

## 2023-06-12 DIAGNOSIS — E1122 Type 2 diabetes mellitus with diabetic chronic kidney disease: Secondary | ICD-10-CM | POA: Diagnosis not present

## 2023-06-12 MED ORDER — METOPROLOL TARTRATE 25 MG PO TABS
12.5000 mg | ORAL_TABLET | Freq: Two times a day (BID) | ORAL | 3 refills | Status: DC
Start: 2023-06-12 — End: 2023-06-29

## 2023-06-12 MED ORDER — EMPAGLIFLOZIN 10 MG PO TABS
10.0000 mg | ORAL_TABLET | Freq: Every day | ORAL | 3 refills | Status: DC
Start: 2023-06-12 — End: 2024-02-24

## 2023-06-12 NOTE — Patient Instructions (Addendum)
A few things to remember from today's visit:  Hypertension with heart disease - Plan: metoprolol tartrate (LOPRESSOR) 25 MG tablet  Type 2 diabetes mellitus with stage 3 chronic kidney disease, with long-term current use of insulin, unspecified whether stage 3a or 3b CKD (HCC) - Plan: empagliflozin (JARDIANCE) 10 MG TABS tablet  Stage 3a chronic kidney disease (HCC)  Peripheral polyneuropathy  Non-ST elevation (NSTEMI) myocardial infarction (HCC)  Novolog before lunch. No changes in rest. Can continue Nystatin powder and Lotrisone cream daily as needed. Please call nephrologist's office.  If you need refills for medications you take chronically, please call your pharmacy. Do not use My Chart to request refills or for acute issues that need immediate attention. If you send a my chart message, it may take a few days to be addressed, specially if I am not in the office.  Please be sure medication list is accurate. If a new problem present, please set up appointment sooner than planned today.

## 2023-06-13 NOTE — Assessment & Plan Note (Signed)
Cr 1.2-1.4 and e GFR high 40's-50's, last Cr 1.78 and e GFR 39. BMP has been scheduled at his cardiologist office. He got the contact information for nephrologist's office. Continue adequate hydration,low salt diet,and avoidance of NSAID's. Recently Jardiance 10 mg was added.

## 2023-06-13 NOTE — Assessment & Plan Note (Addendum)
Currently on Atorvastatin 80 mg daily, aspirin 81 mg daily, Amlodipine 5 mg daily, and Metoprolol tartrate 12.5 mg bid. Last LDL 66 in 02/2022. Following with cardiologist,next  appt 06/19/23.

## 2023-06-13 NOTE — Assessment & Plan Note (Signed)
Last HgA1C 7.5. Continue Lantus 45 U daily and Humalog before lunch instead dinner per sliding scale. Dietary recommendations discussed. Annual eye exam, periodic dental and foot care to continue. F/U in 5-6 months.

## 2023-06-13 NOTE — Assessment & Plan Note (Addendum)
Diabetic and chemo related, stable. Continue appropriate foot care.

## 2023-06-13 NOTE — Assessment & Plan Note (Signed)
Re-checked 120/60 LUE, BP adequately controlled. Continue amlodipine 5 mg daily, and Metoprolol tartrate 25 mg 0.5 tab bid. Continue monitoring BP regularly and low salt diet.

## 2023-06-15 NOTE — Progress Notes (Signed)
HEART & VASCULAR TRANSITION OF CARE CONSULT NOTE     Referring Physician: Dr. Excell Seltzer Primary Care: Dr. [place] Primary Cardiologist: Dr. Cristal Deer  HPI: Referred to clinic by Dr. Excell Seltzer with [facility] for heart failure consultation. 77 y.o. male with history of AF, CAD with prior MI s/p CABG (LIMA to LAD, SVG to RCA and SVG to ramus) in 2004, HTN, HLD, OSA, CKD IIIa, DM, hx prostate cancer s/p radiation and pancreatic cancer s/p distal pancreatectomy/splenectomy and chemotherapy in 2022. 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  He was admitted 06/08/23 after he was seen in Cardiology clinic with dyspnea and chest pain radiating to back and arms. He ruled in for NSTEMI. Cardiac cath demonstrated patent LIMA to LAD and SVG to PDA, vein graft to ramus was occluded. He was managed medically. LVEDP elevated to 28-30 mmHg. Echo with EF 50-55%. He was diuresed and GDMT adjusted but limited by soft BP and AKI. Valsartan was discontinued.  He is here today for hospital follow-up. He is accompanied by his wife. Reports some dyspnea with exertion which is at his baseline. He ambulates with a walker or a cane. Weight has been stable around 253 lb since discharge. No orthopnea or PND. Has chronic LE edema, R > L. Wears compressions stockings most days. Reports he has been eating out more the last few months, his wife had back surgery and hasn't been able to cook as much. BP has been variable, averaging 110s-150s (mostly 120s-140s)/50s-60s. Pulse 40s-50s.   Past Medical History:  Diagnosis Date   A-fib (HCC) 03/04/2021   Anemia    Arthritis    Cancer (HCC)    prostate   Chronic kidney disease    blood in urine    Constipation    Coronary artery disease    Depression    Diabetes mellitus without complication (HCC)    Difficult intubation    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 it was hard to get the tube down his throat   Dyspnea    Family history of breast cancer     Fatty liver    Fatty liver    Frequent headaches    GERD (gastroesophageal reflux disease)    History of chicken pox    History of fainting spells of unknown cause    History of prostate cancer    Hyperlipidemia    Hypertension    Myocardial infarction [facility])    Peripheral neuropathy    Pneumonia    Prostate cancer (HCC)    PTSD (post-traumatic stress disorder)    Sleep apnea    uses Cpap    Current Outpatient Medications  Medication Sig Dispense Refill   acetaminophen (TYLENOL) 500 MG tablet Take 1,000 mg by mouth in the morning, at noon, and at bedtime.     amLODipine (NORVASC) 10 MG tablet Take 5 mg by mouth daily.     apixaban (ELIQUIS) 5 MG TABS tablet Take 5 mg by mouth 2 (two) times daily.     aspirin EC 81 MG tablet Take 1 tablet (81 mg total) by mouth daily then as directed by provider. Swallow whole. 120 tablet 3   atorvastatin (LIPITOR) 80 MG tablet Take 40 mg by mouth at bedtime.     Calcium Carbonate Antacid (TUMS PO) Take 2 tablets by mouth as needed (reflux).     calcium-vitamin D (OSCAL WITH D) 500-200 MG-UNIT tablet Take 1 tablet by mouth 2 (two) times daily.  CRANBERRY PO Take 1 tablet by mouth in the morning and at bedtime.     empagliflozin (JARDIANCE) 10 MG TABS tablet Take 1 tablet (10 mg total) by mouth daily. 90 tablet 3   ferrous sulfate 325 (65 FE) MG tablet Take 1 tablet (325 mg total) by mouth daily with breakfast. 90 tablet 2   FLUoxetine (PROZAC) 20 MG capsule Take 40 mg by mouth daily.     furosemide (LASIX) 20 MG tablet Take 2 tablets (40 mg total) by mouth daily. 90 tablet 3   glucose blood (ONETOUCH VERIO) test strip Use to test blood sugars 1-2 times daily. 200 each 12   insulin aspart (NOVOLOG) 100 UNIT/ML injection Inject 3-5 Units into the skin See admin instructions. Inject 3 units at lunch time and 5 units at dinner     insulin glargine (LANTUS) 100 UNIT/ML injection Inject 0.45 mLs (45 Units total) into the skin daily. (Patient taking  differently: Inject 50 Units into the skin daily.) 10 mL 3   metoprolol tartrate (LOPRESSOR) 25 MG tablet Take 0.5 tablets (12.5 mg total) by mouth 2 (two) times daily. 45 tablet 3   Multiple Vitamin (MULTI-VITAMINS) TABS Take 1 tablet by mouth daily.     nitroGLYCERIN (NITROSTAT) 0.3 MG SL tablet Place 0.3 mg under the tongue every 5 (five) minutes as needed for chest pain.     pantoprazole (PROTONIX) 40 MG tablet Take 1 tablet (40 mg total) by mouth daily. 90 tablet 3   Probiotic Product (PROBIOTIC ADVANCED PO) Take 1 capsule by mouth at bedtime.     tamsulosin (FLOMAX) 0.4 MG CAPS capsule Take 0.8 mg by mouth at bedtime.     traZODone (DESYREL) 100 MG tablet Take 100 mg by mouth at bedtime.     vitamin B-12 (CYANOCOBALAMIN) 1000 MCG tablet Take 1,000 mcg by mouth daily.     Wheat Dextrin (BENEFIBER DRINK MIX PO) Take 1 Scoop by mouth at bedtime.     No current facility-administered medications for this encounter.    Allergies  Allergen Reactions   Keflex [Cephalexin] Other (See Comments)    Hallucinations?   Lisinopril Other (See Comments)    Unknown   Terazosin Other (See Comments)    Unknown      Social History   Socioeconomic History   Marital status: Married    Spouse name: Glennis   Number of children: 2   Years of education: Not on file   Highest education level: Associate degree: academic program  Occupational History   Occupation: retired  Tobacco Use   Smoking status: Former    Types: Cigarettes    Quit date: 12/14/1975    Years since quitting: 47.5   Smokeless tobacco: Never  Vaping Use   Vaping Use: Not on file  Substance and Sexual Activity   Alcohol use: Not Currently   Drug use: Never   Sexual activity: Not Currently  Other Topics Concern   Not on file  Social History Narrative   ** Merged History Encounter **       Social Determinants of Health   Financial Resource Strain: Low Risk  (06/11/2023)   Overall Financial Resource Strain (CARDIA)     Difficulty of Paying Living Expenses: Not hard at all  Food Insecurity: No Food Insecurity (06/11/2023)   Hunger Vital Sign    Worried About Running Out of Food in the Last Year: Never true    Ran Out of Food in the Last Year: Never true  Transportation Needs:  No Transportation Needs (06/11/2023)   PRAPARE - Administrator, Civil Service (Medical): No    Lack of Transportation (Non-Medical): No  Physical Activity: Inactive (06/11/2023)   Exercise Vital Sign    Days of Exercise per Week: 0 days    Minutes of Exercise per Session: 60 min  Stress: No Stress Concern Present (06/11/2023)   [employer] of Occupational Health - Occupational Stress Questionnaire    Feeling of Stress : Not at all  Social Connections: Socially Integrated (06/11/2023)   Social Connection and Isolation Panel [NHANES]    Frequency of Communication with Friends and Family: Twice a week    Frequency of Social Gatherings with Friends and Family: More than three times a week    Attends Religious Services: More than 4 times per year    Active Member of [employer] or Organizations: Yes    Attends Engineer, structural: More than 4 times per year    Marital Status: Married  Catering manager Violence: Not At Risk (06/09/2023)   Humiliation, Afraid, Rape, and Kick questionnaire    Fear of Current or Ex-Partner: No    Emotionally Abused: No    Physically Abused: No    Sexually Abused: No      Family History  Problem Relation Age of Onset   Arthritis Mother    Heart disease Mother    Hyperlipidemia Mother    Hypertension Mother    Heart attack Mother    Breast cancer Sister        dx 30s   Heart attack Brother    Heart disease Brother    Depression Daughter    Arthritis Sister    Cancer Sister        unknown type, dx 29s   Cancer Brother    Learning disabilities Brother    Alcohol abuse Brother    COPD Brother    Heart attack Brother    Heart disease Brother     Vitals:   06/22/23 1031   BP: (!) 140/62  Pulse: (!) 58  SpO2: 95%  Weight: 117.2 kg (258 lb 6.4 oz)    PHYSICAL EXAM: General:  Well appearing elderly male. Arrived in wheelchair. HEENT: normal Neck: supple. JVP difficult d/t thick neck. Carotids 2+ bilat; no bruits.  Cor: PMI nondisplaced. Regular rhythm, brady. No rubs, gallops or murmurs. Lungs: clear Abdomen: obese, soft, nontender, nondistended.  Extremities: no cyanosis, clubbing, rash, 1-2 + edema Neuro: alert & oriented x 3. Affect pleasant.  ECG: Sinus bradycardia 47 bpm, anterior Qs   ASSESSMENT & PLAN: HFpEF -Echo 03/22: EF 60-65%, RV okay -Echo 06/24: EF 50-55%, RV not well visualized -NYHA early III. Volume okay on exam. Continue lasix 40 mg daily -Continue Jardiance 10 mg daily -On metoprolol tartrate. No indication for HFpEF, continue for hx of atrial fibrillation/recent NSTEMI. -Would eventually consider Enresto but would be hesitant to do this initially d/t history of orthostatic hypotension. Add back valsartan if Cr improving on labs, then can try to switch to [facility] if ARB is tolerated. -BMET today  2. CAD/recent NSTEMI -S/p CABG in 2004 -Recent NSTEMI 06/24. LHC with patent LIMA to LAD and SVG to PDA, vein graft to ramus was occluded. He was managed medically. -On aspirin, statin and beta blocker -Denies angina  3. PAF -Sinus brady today. Prescribed metoprolol tartrate 12.5 mg BID but has been taking 25 mg BID. Decrease dose to 12.5 mg BID -Continue Eliquis 5 mg BID.  4. AKI on CKD  IIIa -Baseline Scr 1.3-1.4, AKI during recent admit with Scr up to 1.8. Scr 1.86 on f/u labs last week.  -Recheck labs today to see if Scr improving  -W/ history of DM and CKD, would ideally like back on ARB or ARNi  5. HTN -BP variable as above -Med adjustments depending on follow-up labs.  6. DM II -A1c 7.5% -Insulin dependent -Continue jardiance  7. OSA  - Using CPAP nightly   Referred to HFSW (PCP, Medications, Transportation,  ETOH Abuse, Drug Abuse, Insurance, Financial ): No Refer to Pharmacy: No Refer to Home Health: No Refer to Advanced Heart Failure Clinic: No Refer to General Cardiology: No, already established  Follow up  PRN, follow-up with Dr. Cristal Deer as scheduled 08/26/23

## 2023-06-15 NOTE — Progress Notes (Signed)
Cardiology Clinic Note   Patient Name: [name] Date of Encounter: 06/19/2023  Primary Care Provider:  [place], [name] G, MD Primary Cardiologist:  Jodelle Red, MD  Patient Profile    [name] presents to the clinic today for follow-up evaluation of his coronary artery disease, paroxysmal somal fibrillation, and PAD.  Past Medical History    Past Medical History:  Diagnosis Date   A-fib (HCC) 03/04/2021   Anemia    Arthritis    Cancer (HCC)    prostate   Chronic kidney disease    blood in urine    Constipation    Coronary artery disease    Depression    Diabetes mellitus without complication (HCC)    Difficult intubation    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 it was hard to get the tube down his throat   Dyspnea    Family history of breast cancer    Fatty liver    Fatty liver    Frequent headaches    GERD (gastroesophageal reflux disease)    History of chicken pox    History of fainting spells of unknown cause    History of prostate cancer    Hyperlipidemia    Hypertension    Myocardial infarction [facility])    Peripheral neuropathy    Pneumonia    Prostate cancer (HCC)    PTSD (post-traumatic stress disorder)    Sleep apnea    uses Cpap   Past Surgical History:  Procedure Laterality Date   APPENDECTOMY     BIOPSY  01/15/2021   Procedure: BIOPSY;  Surgeon: Lemar Lofty., MD;  Location: [facility] ENDOSCOPY;  Service: Gastroenterology;;   CARDIAC CATHETERIZATION  06/26/2017   CARDIAC SURGERY     Triple Bypass   CHOLECYSTECTOMY  2010   COLONOSCOPY     CORONARY ARTERY BYPASS GRAFT  2004   ESOPHAGOGASTRODUODENOSCOPY (EGD) WITH PROPOFOL N/A 01/15/2021   Procedure: ESOPHAGOGASTRODUODENOSCOPY (EGD) WITH PROPOFOL;  Surgeon: Lemar Lofty., MD;  Location: [facility] ENDOSCOPY;  Service: Gastroenterology;  Laterality: N/A;   EUS N/A 01/15/2021   Procedure: UPPER ENDOSCOPIC ULTRASOUND (EUS) RADIAL;  Surgeon: Lemar Lofty., MD;   Location: [facility] ENDOSCOPY;  Service: Gastroenterology;  Laterality: N/A;   EYE SURGERY Bilateral    cataract removal   FINE NEEDLE ASPIRATION  01/15/2021   Procedure: FINE NEEDLE ASPIRATION (FNA) LINEAR;  Surgeon: Lemar Lofty., MD;  Location: [facility] ENDOSCOPY;  Service: Gastroenterology;;   LAPAROSCOPY N/A 07/01/2021   Procedure: STAGING LAPAROSCOPY;  Surgeon: Fritzi Mandes, MD;  Location: [facility] OR;  Service: General;  Laterality: N/A;   LEFT HEART CATH AND CORS/GRAFTS ANGIOGRAPHY N/A 06/09/2023   Procedure: LEFT HEART CATH AND CORS/GRAFTS ANGIOGRAPHY;  Surgeon: [name], MD;  Location: [facility] INVASIVE CV LAB;  Service: Cardiovascular;  Laterality: N/A;   PORTACATH PLACEMENT Right 02/21/2021   Procedure: INSERTION PORT-A-CATH;  Surgeon: Fritzi Mandes, MD;  Location: [facility] OR;  Service: General;  Laterality: Right;   SPLENECTOMY, 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   Procedure: SPLENECTOMY;  Surgeon: Fritzi Mandes, MD;  Location: [facility] OR;  Service: General;  Laterality: N/A;   TONSILLECTOMY  1958    Allergies  Allergies  Allergen Reactions   Keflex [Cephalexin] Other (See Comments)    Hallucinations?   Lisinopril Other (See Comments)    Unknown   Terazosin Other (See Comments)    Unknown    History of Present Illness    [name] has a PMH of paroxysmal atrial fibrillation,  peripheral arterial disease, hyperlipidemia, OSA, coronary artery disease status post CABG x 4 in [place], cardiac catheterization 2016 and 2018 with patent grafts L-LAD, S-RCA, S-RI, cellulitis, pancreatic cancer, foot pain, and hyperlipidemia.  His echocardiogram 3/22 showed an LVEF of 60 to 65%, intermediate diastolic parameters and no significant valvular abnormalities.  He was seen by Gillian Shields, NP 05/05/2022 for a HTN appointment.  He had been feeling well and tolerating his medications well.  Follow-up was planned for 4 months.  He was seen in follow-up by Dr. Cristal Deer on 09/08/2022.  He was  accompanied by his wife.  He was doing well.  His medications were reviewed.  He did notice lower extremity swelling.  He reported that the swelling was gone in the morning.  He was staying active watering his plants outside.  He had noted an improvement in his mobility since doing the door.  They were not doing any formal exercise since his wife had a fall back in 5/23.  He denied palpitations and chest pain.  He denied lightheadedness syncope orthopnea and PND.  He presented to the clinic 12/31/22 for follow-up evaluation and preoperative cardiac evaluation.  He stated he would be seeing the podiatrist later that day for his right foot.  He was also recovering from prostate cancer and had developed some cystitis.  He hoped that the hyperbaric chamber treatments would help with his healing.  He was not very physically active at home.  He checked his blood pressure regularly and noted that his blood pressures were running in the 140 systolic on his machine however, he had been told that his machine produced elevated results.  Initially in the clinic  his blood pressure was 142/72 and on recheck was 128/72.  I asked him to continue to monitor his blood pressure, continue to use lower extremity support stockings, and plannned follow-up in September with Dr. Cristal Deer.  He was seen by Dr. Cristal Deer 06/08/2023.  During that time he reported shortness of breath.  He was tachypneic with respiratory rates in the 30s.  His oxygen saturation was 97%.  He noted bilateral arm pressure that have been on and off for weeks.  He had taken sublingual nitroglycerin several times and noted some relief but not complete resolution.  He noted a 10 pound weight gain over the last month and noted a mild increase in his lower extremity swelling.  His EKG did not show ST elevation.  He was sent to the emergency department for admission and further evaluation.  He was admitted on 06/08/2023 and discharged on 06/09/2025.  He underwent  cardiac catheterization on 06/09/2023 and was noted to have patent LIMA-LAD and SVG-PDA.  His SVG-ramus was occluded.  He was noted to have severe native vessel disease with occluded ostial LAD, occluded proximal left circumflex and known occluded ostial right coronary artery.  His LVEDP was elevated at 28-30 mmHg.  Medical management was recommended.  His echocardiogram at that time showed an LVEF of 50-55% left ventricular global hypokinesis, mild concentric left ventricular hypertrophy and no evidence of mitral stenosis.  He was started on Jardiance and received diuresis.  He presents to the clinic today for follow-up evaluation and reports that he continues to have some fatigue.  He has been monitoring his blood pressure and heart rate.  His blood pressures have been well-controlled.  His heart rates have been in the 40s-50s.  He has also been monitoring his weight which has been stable.  We reviewed his catheterization and he  expressed understanding.  His cholesterol is well-controlled.  I encouraged him to be more physically active, continue to lose weight and keep a weight log.  Will plan follow-up as scheduled with Dr. Cristal Deer.  Today he denies chest pain, increased shortness of breath, lower extremity edema, fatigue, palpitations, melena, hematuria, hemoptysis, diaphoresis, weakness, presyncope, syncope, orthopnea, and PND.   Home Medications    Prior to Admission medications   Medication Sig Start Date End Date Taking? Authorizing Provider  acetaminophen (TYLENOL) 500 MG tablet Take 1,000 mg by mouth 2 (two) times daily.    [provider]  amLODipine (NORVASC) 5 MG tablet Take 1 tablet (5 mg total) by mouth daily. 02/10/22 10/31/22  Alver Sorrow, NP  apixaban (ELIQUIS) 5 MG TABS tablet Take 5 mg by mouth 2 (two) times daily.    [provider]  atorvastatin (LIPITOR) 80 MG tablet Take 40 mg by mouth at bedtime. 09/22/19   [provider]  calcium-vitamin D  (OSCAL WITH D) 500-200 MG-UNIT tablet Take 1 tablet by mouth 2 (two) times daily.    [provider]  clotrimazole-betamethasone (LOTRISONE) cream Apply 1 application topically daily as needed. 11/13/21   [place], [name] G, MD  cycloSPORINE (RESTASIS) 0.05 % ophthalmic emulsion Place 1 drop into both eyes 2 (two) times daily as needed (dry eyes).    [provider]  ferrous sulfate 325 (65 FE) MG tablet Take 1 tablet (325 mg total) by mouth daily with breakfast. 11/13/21   [place], [name] G, MD  FLUoxetine (PROZAC) 20 MG capsule Take 40 mg by mouth daily.    [provider]  fluticasone (FLONASE) 50 MCG/ACT nasal spray Place 1 spray into both nostrils daily.    [provider]  furosemide (LASIX) 20 MG tablet Take 1 tablet (20 mg total) by mouth daily. 01/29/22 10/31/22  Swinyer, Zachary George, NP  glucose blood (ONETOUCH VERIO) test strip Use to test blood sugars 1-2 times daily. 07/26/21   [place], [name] G, MD  hydrALAZINE (APRESOLINE) 25 MG tablet Take 1 tablet (25 mg total) by mouth 3 (three) times daily. 03/19/22   [place], Peter M, MD  insulin glargine (LANTUS) 100 UNIT/ML injection Inject 0.45 mLs (45 Units total) into the skin daily. 04/08/22   [place], [name] G, MD  insulin lispro (HUMALOG) 100 UNIT/ML injection Per sliding scale. 07/07/22   [place], [name] G, MD  lidocaine-prilocaine (EMLA) cream Apply 1 application. topically as needed.    [provider]  loratadine (CLARITIN) 10 MG tablet Take 10 mg by mouth daily as needed.    [provider]  Multiple Vitamin (MULTI-VITAMINS) TABS Take 1 tablet by mouth daily.    [provider]  nitroGLYCERIN (NITROSTAT) 0.3 MG SL tablet Place 0.3 mg under the tongue every 5 (five) minutes as needed for chest pain.    [provider]  nystatin powder Apply 1 application topically 3 (three) times daily. 11/13/21   [place], [name] G, MD  pantoprazole (PROTONIX) 40 MG tablet Take 1 tablet (40 mg total)  by mouth daily. 07/15/21   [place], [name] G, MD  Probiotic Product (PROBIOTIC ADVANCED PO) Take 1 capsule by mouth at bedtime.    [provider]  tamsulosin (FLOMAX) 0.4 MG CAPS capsule Take 0.8 mg by mouth at bedtime. 03/27/21   [provider]  traZODone (DESYREL) 100 MG tablet Take 100 mg by mouth at bedtime.    [provider]  valsartan (DIOVAN) 160 MG tablet Take 0.5 tablets (80 mg  total) by mouth daily. 07/07/22   [place], [name] G, MD  vitamin B-12 (CYANOCOBALAMIN) 1000 MCG tablet Take 1,000 mcg by mouth daily.    [provider]  Wheat Dextrin (BENEFIBER DRINK MIX PO) Take by mouth daily.    [provider]    Family History    Family History  Problem Relation Age of Onset   Arthritis Mother    Heart disease Mother    Hyperlipidemia Mother    Hypertension Mother    Heart attack Mother    Breast cancer Sister        dx 30s   Heart attack Brother    Heart disease Brother    Depression Daughter    Arthritis Sister    Cancer Sister        unknown type, dx 61s   Cancer Brother    Learning disabilities Brother    Alcohol abuse Brother    COPD Brother    Heart attack Brother    Heart disease Brother    He indicated that his mother is deceased. He indicated that his father is deceased. He indicated that two of his three sisters are alive. He indicated that all of his three brothers are deceased. He indicated that his maternal grandmother is deceased. He indicated that his maternal grandfather is deceased. He indicated that his paternal grandmother is deceased. He indicated that his paternal grandfather is deceased. He indicated that his daughter is alive. He indicated that his son is alive.  Social History    Social History   Socioeconomic History   Marital status: Married    Spouse name: Glennis   Number of children: 2   Years of education: Not on file   Highest education level: Associate degree: academic program  Occupational  History   Occupation: retired  Tobacco Use   Smoking status: Former    Types: Cigarettes    Quit date: 12/14/1975    Years since quitting: 47.5   Smokeless tobacco: Never  Vaping Use   Vaping Use: Not on file  Substance and Sexual Activity   Alcohol use: Not Currently   Drug use: Never   Sexual activity: Not Currently  Other Topics Concern   Not on file  Social History Narrative   ** Merged History Encounter **       Social Determinants of Health   Financial Resource Strain: Low Risk  (06/11/2023)   Overall Financial Resource Strain (CARDIA)    Difficulty of Paying Living Expenses: Not hard at all  Food Insecurity: No Food Insecurity (06/11/2023)   Hunger Vital Sign    Worried About Running Out of Food in the Last Year: Never true    Ran Out of Food in the Last Year: Never true  Transportation Needs: No Transportation Needs (06/11/2023)   PRAPARE - Administrator, Civil Service (Medical): No    Lack of Transportation (Non-Medical): No  Physical Activity: Inactive (06/11/2023)   Exercise Vital Sign    Days of Exercise per Week: 0 days    Minutes of Exercise per Session: 60 min  Stress: No Stress Concern Present (06/11/2023)   [employer] of Occupational Health - Occupational Stress Questionnaire    Feeling of Stress : Not at all  Social Connections: Socially Integrated (06/11/2023)   Social Connection and Isolation Panel [NHANES]    Frequency of Communication with Friends and Family: Twice a week    Frequency of Social Gatherings with Friends and Family: More than three times  a week    Attends Religious Services: More than 4 times per year    Active Member of Clubs or Organizations: Yes    Attends Banker Meetings: More than 4 times per year    Marital Status: Married  Catering manager Violence: Not At Risk (06/09/2023)   Humiliation, Afraid, Rape, and Kick questionnaire    Fear of Current or Ex-Partner: No    Emotionally Abused: No     Physically Abused: No    Sexually Abused: No     Review of Systems    General:  No chills, fever, night sweats or weight changes.  Cardiovascular:  No chest pain, dyspnea on exertion, edema, orthopnea, palpitations, paroxysmal nocturnal dyspnea. Dermatological: No rash, lesions/masses Respiratory: No cough, dyspnea Urologic: No hematuria, dysuria Abdominal:   No nausea, vomiting, diarrhea, bright red blood per rectum, melena, or hematemesis Neurologic:  No visual changes, wkns, changes in mental status. All other systems reviewed and are otherwise negative except as noted above.  Physical Exam    VS:  BP 128/74   Pulse (!) 56   Ht 5\' 11"  (1.803 m)   Wt 257 lb 6.4 oz (116.8 kg)   SpO2 97%   BMI 35.90 kg/m  , BMI Body mass index is 35.9 kg/m. GEN: Well nourished, well developed, in no acute distress. HEENT: normal. Neck: Supple, no JVD, carotid bruits, or masses. Cardiac: RRR, no murmurs, rubs, or gallops. No clubbing, cyanosis, generalized bilateral lower extremity nonpitting edema.  Radials/DP/PT 2+ and equal bilaterally.  Respiratory:  Respirations regular and unlabored, clear to auscultation bilaterally. GI: Soft, nontender, nondistended, BS + x 4. MS: no deformity or atrophy. Skin: warm and dry, no rash.   Neuro:  Strength and sensation are intact. Psych: Normal affect.  Accessory Clinical Findings    Recent Labs: 06/08/2023: ALT 18; B Natriuretic Peptide 347.4; Magnesium 1.9; TSH 2.210 06/10/2023: Hemoglobin 13.0; Platelets 255 06/17/2023: BUN 42; Creatinine, Ser 1.86; Potassium 4.4; Sodium 140   Recent Lipid Panel    Component Value Date/Time   CHOL 95 06/09/2023 0636   CHOL 101 12/16/2018 1003   TRIG 42 06/09/2023 0636   HDL 40 (L) 06/09/2023 0636   HDL 38 (L) 12/16/2018 1003   CHOLHDL 2.4 06/09/2023 0636   VLDL 8 06/09/2023 0636   LDLCALC 47 06/09/2023 0636   LDLCALC 55 10/10/2020 0940         ECG personally reviewed by me today-none today.  EKG  12/31/2022 normal sinus rhythm anterior septal infarct undetermined age 58 bpm- No acute changes  Echocardiogram 03/04/2021  IMPRESSIONS     1. Left ventricular ejection fraction, by estimation, is 60 to 65%. The  left ventricle has normal function. The left ventricle has no regional  wall motion abnormalities. Left ventricular diastolic parameters are  indeterminate.   2. Right ventricular systolic function is normal. The right ventricular  size is normal. There is normal pulmonary artery systolic pressure.   3. The mitral valve is normal in structure. No evidence of mitral valve  regurgitation.   4. The aortic valve is normal in structure. Aortic valve regurgitation is  not visualized.   5. The inferior vena cava is normal in size with greater than 50%  respiratory variability, suggesting right atrial pressure of 3 mmHg.   FINDINGS   Left Ventricle: Left ventricular ejection fraction, by estimation, is 60  to 65%. The left ventricle has normal function. The left ventricle has no  regional wall motion abnormalities. The left  ventricular internal cavity  size was normal in size. There is   no left ventricular hypertrophy. Left ventricular diastolic parameters  are indeterminate.   Right Ventricle: The right ventricular size is normal. Right vetricular  wall thickness was not assessed. Right ventricular systolic function is  normal. There is normal pulmonary artery systolic pressure. The tricuspid  regurgitant velocity is 2.28 m/s,  and with an assumed right atrial pressure of 3 mmHg, the estimated right  ventricular systolic pressure is 23.8 mmHg.   Left Atrium: Left atrial size was normal in size.   Right Atrium: Right atrial size was normal in size.   Pericardium: There is no evidence of pericardial effusion.   Mitral Valve: The mitral valve is normal in structure. No evidence of  mitral valve regurgitation.   Tricuspid Valve: The tricuspid valve is normal in structure.  Tricuspid  valve regurgitation is trivial.   Aortic Valve: The aortic valve is normal in structure. Aortic valve  regurgitation is not visualized.   Pulmonic Valve: The pulmonic valve was normal in structure. Pulmonic valve  regurgitation is not visualized.   Aorta: The aortic root and ascending aorta are structurally normal, with  no evidence of dilitation.   Venous: The inferior vena cava is normal in size with greater than 50%  respiratory variability, suggesting right atrial pressure of 3 mmHg.   IAS/Shunts: No atrial level shunt detected by color flow Doppler.   LHC 06/09/23    Prox Graft to Mid Graft lesion is 40% stenosed.   Ost RCA to Prox RCA lesion is 100% stenosed.   Origin to Prox Graft lesion is 100% stenosed.   Ramus lesion is 80% stenosed.   Dist LM lesion is 80% stenosed.   Ost LAD lesion is 100% stenosed.   Prox Cx to Mid Cx lesion is 100% stenosed.   1.  Patent LIMA to LAD and vein graft to PDA; the vein graft to ramus is now occluded. 2.  Severe native vessel disease with occluded ostial LAD, occluded proximal left circumflex and known occluded ostial right coronary artery (not imaged on this study). 3.  Elevated LVEDP of 28 to 30 mmHg across the respiratory cycle.   Recommendation: Medical therapy.  Diagnostic Dominance: Right  Intervention   Assessment & Plan   1.  NSTEMI, coronary artery disease-denies chest pain today.  Status post CABG 2004.  Underwent LHC 06/09/2023 and was noted to have patent LIMA-LAD, SVG-PDA and occluded SVG-ramus.  He was noted to have severe native vessel disease with occluded ostial LAD, occluded proximal left circumflex and known occluded ostial right coronary artery.  His LVEDP was elevated at 28-32 mmHg.  Details above.  Medical management was recommended.  Echocardiogram 06/09/2023 showed an LVEF of 50-55%, left ventricle global hypokinesis, and no evidence of mitral valve regurgitation.  Details above. Continue atorvastatin,  hydralazine, amlodipine, metoprolol Heart healthy low-sodium diet-salty 6 reviewed Increase physical activity as tolerated  Acute on chronic HFpEF-weight stable.  Euvolemic.  NYHA II.   Continue Jardiance, metoprolol Heart healthy low-sodium diet Lower extremity support stockings Daily weights-contact office with a weight increase of 2 to 3 pounds overnight or 5 pounds in 1 week.  Essential hypertension-BP today 128/74 Continue amlodipine, valsartan, furosemide, hydralazine Heart healthy low-sodium diet-salty 6 given Increase physical activity as tolerated   Paroxysmal atrial fibrillation-heart rate today 56 .  Compliant with apixaban and denies bleeding issues.  Denies recent episodes of irregular or accelerated heartbeat. Continue apixaban, metoprolol Avoid triggers caffeine, chocolate, EtOH,  dehydration etc.  Hyperlipidemia-06/09/2023: Cholesterol 95; HDL 40; LDL Cholesterol 47; Triglycerides 42; VLDL 8 Continue atorvastatin, aspirin High-fiber diet   Disposition: Follow-up with Dr. Cristal Deer or me in 3-4 months.   Thomasene Ripple. Mahamed Zalewski NP-C     06/19/2023, 10:53 AM [facility] [address] Office [phone] Fax [phone]    I spent 14 minutes examining this patient, reviewing medications, and using patient centered shared decision making involving her cardiac care.  Prior to her visit I spent greater than 20 minutes reviewing her past medical history,  medications, and prior cardiac tests.

## 2023-06-16 ENCOUNTER — Telehealth (HOSPITAL_COMMUNITY): Payer: Self-pay

## 2023-06-16 NOTE — Telephone Encounter (Signed)
Called pt about recent referral we have received. Left pt VM

## 2023-06-17 ENCOUNTER — Other Ambulatory Visit: Payer: Self-pay

## 2023-06-17 DIAGNOSIS — N179 Acute kidney failure, unspecified: Secondary | ICD-10-CM

## 2023-06-18 LAB — BASIC METABOLIC PANEL
BUN/Creatinine Ratio: 23 (ref 10–24)
BUN: 42 mg/dL — ABNORMAL HIGH (ref 8–27)
CO2: 23 mmol/L (ref 20–29)
Calcium: 9.2 mg/dL (ref 8.6–10.2)
Chloride: 102 mmol/L (ref 96–106)
Creatinine, Ser: 1.86 mg/dL — ABNORMAL HIGH (ref 0.76–1.27)
Glucose: 143 mg/dL — ABNORMAL HIGH (ref 70–99)
Potassium: 4.4 mmol/L (ref 3.5–5.2)
Sodium: 140 mmol/L (ref 134–144)
eGFR: 37 mL/min/{1.73_m2} — ABNORMAL LOW (ref >59)

## 2023-06-19 ENCOUNTER — Encounter: Payer: Self-pay | Admitting: General Practice

## 2023-06-19 ENCOUNTER — Ambulatory Visit: Payer: Medicare Other | Attending: General Practice | Admitting: General Practice

## 2023-06-19 VITALS — BP 128/74 | HR 56 | Ht 71.0 in | Wt 257.4 lb

## 2023-06-19 DIAGNOSIS — I5033 Acute on chronic diastolic (congestive) heart failure: Secondary | ICD-10-CM | POA: Diagnosis not present

## 2023-06-19 DIAGNOSIS — I214 Non-ST elevation (NSTEMI) myocardial infarction: Secondary | ICD-10-CM | POA: Diagnosis not present

## 2023-06-19 DIAGNOSIS — E785 Hyperlipidemia, unspecified: Secondary | ICD-10-CM

## 2023-06-19 DIAGNOSIS — I10 Essential (primary) hypertension: Secondary | ICD-10-CM | POA: Insufficient documentation

## 2023-06-19 DIAGNOSIS — I2510 Atherosclerotic heart disease of native coronary artery without angina pectoris: Secondary | ICD-10-CM | POA: Diagnosis not present

## 2023-06-19 DIAGNOSIS — I480 Paroxysmal atrial fibrillation: Secondary | ICD-10-CM

## 2023-06-19 NOTE — Patient Instructions (Signed)
Medication Instructions:  The current medical regimen is effective;  continue present plan and medications as directed. Please refer to the Current Medication list given to you today.  *If you need a refill on your cardiac medications before your next appointment, please call your pharmacy*  Lab Work: NONE If you have labs (blood work) drawn today and your tests are completely normal, you will receive your results only by: MyChart Message (if you have MyChart) OR  A paper copy in the mail If you have any lab test that is abnormal or we need to change your treatment, we will call you to review the results.  Other Instructions CONTINUE BLOOD PRESSURE AND WEIGHT LOG INCREASE PHYSICAL ACTIVITY AS TOLERATED PLEASE READ AND FOLLOW ATTACHED  SALTY 6   Follow-Up: At [facility], you and your health needs are our priority.  As part of our continuing mission to provide you with exceptional heart care, we have created designated Provider Care Teams.  These Care Teams include your primary Cardiologist (physician) and Advanced Practice Providers (APPs -  Physician Assistants and Nurse Practitioners) who all work together to provide you with the care you need, when you need it.  Your next appointment:   KEEP SCHEDULED APPOINTMENT   Provider:   Jodelle Red, MD

## 2023-06-22 ENCOUNTER — Telehealth: Payer: Self-pay | Admitting: Cardiology

## 2023-06-22 ENCOUNTER — Telehealth: Payer: Self-pay | Admitting: Family Medicine

## 2023-06-22 ENCOUNTER — Ambulatory Visit (HOSPITAL_COMMUNITY)
Admit: 2023-06-22 | Discharge: 2023-06-22 | Disposition: A | Discharge status: Home / Self Care | Payer: Medicare Other | Attending: Physician Assistant | Admitting: Physician Assistant

## 2023-06-22 ENCOUNTER — Encounter (HOSPITAL_COMMUNITY): Payer: Self-pay

## 2023-06-22 VITALS — BP 140/62 | HR 58 | Wt 258.4 lb

## 2023-06-22 DIAGNOSIS — I130 Hypertensive heart and chronic kidney disease with heart failure and stage 1 through stage 4 chronic kidney disease, or unspecified chronic kidney disease: Secondary | ICD-10-CM | POA: Diagnosis not present

## 2023-06-22 DIAGNOSIS — Z9081 Acquired absence of spleen: Secondary | ICD-10-CM | POA: Diagnosis not present

## 2023-06-22 DIAGNOSIS — Z951 Presence of aortocoronary bypass graft: Secondary | ICD-10-CM | POA: Diagnosis not present

## 2023-06-22 DIAGNOSIS — Z79899 Other long term (current) drug therapy: Secondary | ICD-10-CM | POA: Insufficient documentation

## 2023-06-22 DIAGNOSIS — E1122 Type 2 diabetes mellitus with diabetic chronic kidney disease: Secondary | ICD-10-CM | POA: Diagnosis not present

## 2023-06-22 DIAGNOSIS — N179 Acute kidney failure, unspecified: Secondary | ICD-10-CM | POA: Diagnosis not present

## 2023-06-22 DIAGNOSIS — E785 Hyperlipidemia, unspecified: Secondary | ICD-10-CM | POA: Diagnosis not present

## 2023-06-22 DIAGNOSIS — I2510 Atherosclerotic heart disease of native coronary artery without angina pectoris: Secondary | ICD-10-CM | POA: Insufficient documentation

## 2023-06-22 DIAGNOSIS — I119 Hypertensive heart disease without heart failure: Secondary | ICD-10-CM

## 2023-06-22 DIAGNOSIS — G4733 Obstructive sleep apnea (adult) (pediatric): Secondary | ICD-10-CM | POA: Diagnosis not present

## 2023-06-22 DIAGNOSIS — E1142 Type 2 diabetes mellitus with diabetic polyneuropathy: Secondary | ICD-10-CM | POA: Insufficient documentation

## 2023-06-22 DIAGNOSIS — I10 Essential (primary) hypertension: Secondary | ICD-10-CM | POA: Diagnosis not present

## 2023-06-22 DIAGNOSIS — I252 Old myocardial infarction: Secondary | ICD-10-CM | POA: Insufficient documentation

## 2023-06-22 DIAGNOSIS — I5032 Chronic diastolic (congestive) heart failure: Secondary | ICD-10-CM | POA: Diagnosis not present

## 2023-06-22 DIAGNOSIS — I480 Paroxysmal atrial fibrillation: Secondary | ICD-10-CM | POA: Insufficient documentation

## 2023-06-22 DIAGNOSIS — G629 Polyneuropathy, unspecified: Secondary | ICD-10-CM

## 2023-06-22 DIAGNOSIS — Z8546 Personal history of malignant neoplasm of prostate: Secondary | ICD-10-CM | POA: Diagnosis not present

## 2023-06-22 DIAGNOSIS — I214 Non-ST elevation (NSTEMI) myocardial infarction: Secondary | ICD-10-CM

## 2023-06-22 DIAGNOSIS — N1831 Chronic kidney disease, stage 3a: Secondary | ICD-10-CM | POA: Insufficient documentation

## 2023-06-22 DIAGNOSIS — Z7984 Long term (current) use of oral hypoglycemic drugs: Secondary | ICD-10-CM | POA: Insufficient documentation

## 2023-06-22 DIAGNOSIS — Z9221 Personal history of antineoplastic chemotherapy: Secondary | ICD-10-CM | POA: Diagnosis not present

## 2023-06-22 DIAGNOSIS — Z7982 Long term (current) use of aspirin: Secondary | ICD-10-CM | POA: Diagnosis not present

## 2023-06-22 DIAGNOSIS — Z794 Long term (current) use of insulin: Secondary | ICD-10-CM | POA: Diagnosis not present

## 2023-06-22 DIAGNOSIS — Z7901 Long term (current) use of anticoagulants: Secondary | ICD-10-CM | POA: Insufficient documentation

## 2023-06-22 DIAGNOSIS — Z923 Personal history of irradiation: Secondary | ICD-10-CM | POA: Insufficient documentation

## 2023-06-22 DIAGNOSIS — Z90411 Acquired partial absence of pancreas: Secondary | ICD-10-CM | POA: Diagnosis not present

## 2023-06-22 LAB — BASIC METABOLIC PANEL
Anion gap: 10 (ref 5–15)
BUN: 31 mg/dL — ABNORMAL HIGH (ref 8–23)
CO2: 24 mmol/L (ref 22–32)
Calcium: 9.1 mg/dL (ref 8.9–10.3)
Chloride: 105 mmol/L (ref 98–111)
Creatinine, Ser: 1.7 mg/dL — ABNORMAL HIGH (ref 0.61–1.24)
GFR, Estimated: 41 mL/min — ABNORMAL LOW (ref >60)
Glucose, Bld: 148 mg/dL — ABNORMAL HIGH (ref 70–99)
Potassium: 4.2 mmol/L (ref 3.5–5.1)
Sodium: 139 mmol/L (ref 135–145)

## 2023-06-22 NOTE — Patient Instructions (Signed)
PLEASE MAKE SURE THAT YOU ARE ONLY TALKING HALF A METOPROLOL TABLET TWICE A DAY.  Labs done today, your results will be available in MyChart, we will contact you for abnormal readings.  Thank you for allowing [place] to provider your heart failure care after your recent hospitalization. Please follow-up with Dr. Cristal Deer as scheduled.  If you have any questions, issues, or concerns before your next appointment please call our office at [phone], opt. 2 and leave a message for the triage nurse.

## 2023-06-22 NOTE — Telephone Encounter (Signed)
Pt c/o medication issue:  1. Name of Medication:   aspirin EC 81 MG tablet    2. How are you currently taking this medication (dosage and times per day)? ke 1 tablet (81 mg total) by mouth daily then as directed by provider. Swallow whole.   3. Are you having a reaction (difficulty breathing--STAT)? No  4. What is your medication issue? Pt's wife calling to request that a printed copy of above prescription be sent to the [place]. She would like a callback regarding this matter

## 2023-06-22 NOTE — Telephone Encounter (Signed)
Referral placed.

## 2023-06-22 NOTE — Telephone Encounter (Addendum)
Spouse states that [facility] in [facility] has advised Pt to request a Cardiology referral from MD.  (Preferably to [facility])

## 2023-06-22 NOTE — Telephone Encounter (Signed)
Left message to call back  Patient seen today in HF clinic

## 2023-06-23 MED ORDER — ASPIRIN 81 MG PO TBEC: 81.0000 mg | DELAYED_RELEASE_TABLET | Freq: Every day | ORAL | 3 refills | Status: DC

## 2023-06-23 NOTE — Telephone Encounter (Signed)
Returned call to patient, They state he was put on the ASA when he was in the hospital, they didn't give him a prescription to take to the [place]. Requesting an ASA 81mg  prescription to take to the [place]. Printed and mailed to the patient as requested.

## 2023-06-24 ENCOUNTER — Telehealth: Payer: Self-pay | Admitting: Podiatry

## 2023-06-24 ENCOUNTER — Telehealth (HOSPITAL_COMMUNITY): Payer: Self-pay

## 2023-06-24 DIAGNOSIS — I5032 Chronic diastolic (congestive) heart failure: Secondary | ICD-10-CM

## 2023-06-24 NOTE — Telephone Encounter (Signed)
Patient and spouse advised and verbalized understanding,lab appointment scheduled,lab orders entered.  Orders Placed This Encounter  Procedures   Basic metabolic panel    Standing Status:   Future    Standing Expiration Date:   06/23/2024    Order Specific Question:   Release to patient    Answer:   Immediate    Order Specific Question:   Release to patient    Answer:   Immediate [1]

## 2023-06-24 NOTE — Telephone Encounter (Signed)
-----   Message from Andrey Farmer, [place] sent at 06/22/2023  2:57 PM EDT ----- Recheck BMET in 2 weeks, if kidney function further improved could add valsartan back at that time if his BP allows.

## 2023-06-24 NOTE — Telephone Encounter (Signed)
Pt called upset because he came in and seen Dr Lilian Kapur on 5.2 for his diabetic shoe inserts to be adjusted and he took them because the offload was not on the inserts as needed. They were to be sent back and pt was told it would be 2 weeks and he has not heard anything.  Talked with Carney Bern and we have scheduled pt to come in tomorrow to be re scanned for new inserts with the offload. I did apologize to the pt. He said thank you  He said he has been very dissatisfied with our services so far and may [facility] letter to the [facility].

## 2023-06-25 ENCOUNTER — Ambulatory Visit (INDEPENDENT_AMBULATORY_CARE_PROVIDER_SITE_OTHER): Payer: Medicare Other | Admitting: Podiatry

## 2023-06-25 DIAGNOSIS — Z794 Long term (current) use of insulin: Secondary | ICD-10-CM

## 2023-06-25 DIAGNOSIS — M21621 Bunionette of right foot: Secondary | ICD-10-CM

## 2023-06-25 DIAGNOSIS — M216X9 Other acquired deformities of unspecified foot: Secondary | ICD-10-CM

## 2023-06-25 DIAGNOSIS — M21622 Bunionette of left foot: Secondary | ICD-10-CM

## 2023-06-25 DIAGNOSIS — E1122 Type 2 diabetes mellitus with diabetic chronic kidney disease: Secondary | ICD-10-CM

## 2023-06-25 DIAGNOSIS — N183 Chronic kidney disease, stage 3 unspecified: Secondary | ICD-10-CM

## 2023-06-25 MED ORDER — DOXYCYCLINE HYCLATE 100 MG PO CAPS: 100.0000 mg | ORAL_CAPSULE | Freq: Two times a day (BID) | ORAL | 0 refills | Status: AC

## 2023-06-25 NOTE — Progress Notes (Signed)
Patient came in to be rescanned for diabetic orthotics . Patient has ingrown nail on great toe right foot. Dr Carlota Raspberry is going to treat and call in antibiotic.   Addendum: It was requested that I evaluate the patient's right hallux toenail.  He came in for his diabetic insert consultation and he noted painful and possibly infected ingrown toenail to the right hallux.  On exam the nail edges are incurvated and growing into the skin.  On the lateral aspect of the right hallux there is localized erythema, edema, evidence of recent drainage, and pain on palpation.  No active bleeding or drainage is noted at this time.  No malodor is noted.  There is no maceration.  Sterile nail nippers were utilized to cut back the distal corners of the nail to alleviate discomfort.  Antibiotic ointment and Band-Aid was applied.  He can remove this later today or tomorrow.  Prescription for doxycycline 100 mg 1 tablet p.o. twice daily x 7 days was sent to the patient's pharmacy.  Follow-up as scheduled with his original podiatrist for at risk footcare.

## 2023-06-29 ENCOUNTER — Other Ambulatory Visit (HOSPITAL_COMMUNITY): Payer: Self-pay

## 2023-06-29 ENCOUNTER — Other Ambulatory Visit: Payer: Self-pay | Admitting: Cardiology

## 2023-06-29 ENCOUNTER — Telehealth: Payer: Self-pay | Admitting: Cardiology

## 2023-06-29 DIAGNOSIS — I119 Hypertensive heart disease without heart failure: Secondary | ICD-10-CM

## 2023-06-29 MED ORDER — METOPROLOL TARTRATE 25 MG PO TABS
12.5000 mg | ORAL_TABLET | Freq: Two times a day (BID) | ORAL | 0 refills | Status: DC
Start: 2023-06-29 — End: 2023-06-29

## 2023-06-29 NOTE — Telephone Encounter (Signed)
*  STAT* If patient is at the pharmacy, call can be transferred to refill team.   1. Which medications need to be refilled? (please list name of each medication and dose if known) metoprolol tartrate (LOPRESSOR) 25 MG tablet  2. Which pharmacy/location (including street and city if local pharmacy) is medication to be sent to? [id number] - [place], [place] - [address] AT [facility] OF [address]  3. Do they need a 30 day or 90 day supply?   [facility] will not [facility] out soon enough and patient is almost out of medication. Patient's wife is requesting a 2 week supply to be sent to patient's local pharmacy listed above.

## 2023-06-29 NOTE — Telephone Encounter (Signed)
Rx(s) sent to pharmacy electronically.  

## 2023-06-30 ENCOUNTER — Encounter (HOSPITAL_COMMUNITY): Payer: Self-pay

## 2023-06-30 ENCOUNTER — Telehealth: Payer: Self-pay | Admitting: Family Medicine

## 2023-06-30 DIAGNOSIS — G4733 Obstructive sleep apnea (adult) (pediatric): Secondary | ICD-10-CM

## 2023-06-30 NOTE — Telephone Encounter (Signed)
Spouse called to ask MD when will they get the Sleep Apnea referral ?  Please advise.

## 2023-06-30 NOTE — Telephone Encounter (Signed)
I called and spoke with patient's wife. She thought she had called over to ask for the referral, but I advised her that I hadn't seen any messages. Pt needs referral to pulmonary for his Cpap machine. Referral placed to preferred location of pulmonary at [facility]. They are aware that they will receive a phone call to schedule.

## 2023-07-01 ENCOUNTER — Telehealth (HOSPITAL_COMMUNITY): Payer: Self-pay

## 2023-07-01 ENCOUNTER — Telehealth (HOSPITAL_BASED_OUTPATIENT_CLINIC_OR_DEPARTMENT_OTHER): Payer: Self-pay | Admitting: Cardiology

## 2023-07-01 MED ORDER — FUROSEMIDE 40 MG PO TABS: 40.0000 mg | ORAL_TABLET | Freq: Every day | ORAL | 3 refills | Status: DC

## 2023-07-01 NOTE — Telephone Encounter (Signed)
Pt c/o swelling/edema: STAT if pt has developed SOB within 24 hours  If swelling, where is the swelling located?   Right leg around ankle and foot  How much weight have you gained and in what time span? Yes  Have you gained 2 pounds in a day or 5 pounds in a week? 252 lbs - 254 lbs since Monday  Do you have a log of your daily weights (if so, list)?   No  Are you currently taking a fluid pill?   Yes  Are you currently SOB?   Yes when up and moving around  Have you traveled recently in a car or plane for an extended period of time?  No  Wife states patient is still having swelling and will need a prescription for furosemide (LASIX) 20 MG tablet sent to [id number] - [place], [place] - [address] AT [facility] OF [address] [facility] [address] as patient gets his medication from the [place] and the medication has not arrived as yet.  Wife stated patient has also had a constant dry cough.

## 2023-07-01 NOTE — Telephone Encounter (Signed)
Spoke with patient and wife Patients weight Monday 252, Tuesday 254. He took lasix 40 mg two separate times yesterday. Weight improved today to 252 Does have a cough that is concerning   Has swelling around his right ankle/foot Swelling goes down at night  Does have  callus right foot, per wife looks good. Saw podiatry 7/11, see note Denies redness or warm to touch  Refilled Lasix 40 mg as requested   Will forward to Ronn Melena NP for review, Dr Cristal Deer out of the office

## 2023-07-02 ENCOUNTER — Encounter (HOSPITAL_COMMUNITY)
Admission: RE | Admit: 2023-07-02 | Discharge: 2023-07-02 | Disposition: A | Discharge status: Home / Self Care | Payer: Medicare Other | Source: Ambulatory Visit | Attending: Cardiology | Admitting: Cardiology

## 2023-07-02 DIAGNOSIS — I214 Non-ST elevation (NSTEMI) myocardial infarction: Secondary | ICD-10-CM | POA: Insufficient documentation

## 2023-07-02 NOTE — Telephone Encounter (Signed)
Advised patients wife, verbalized understanding.

## 2023-07-02 NOTE — Telephone Encounter (Signed)
May take Lasix 40mg  BID x 2 days. Take first dose in morning and second dose between 12-2pm to prevent nocturia.   Ensure reducing salt intake, limiting to <2L fluid intake, elevating legs, wearing compression stockings.   Reminder of labs already scheduled 07/08/23 at HF clinic.   Alver Sorrow, NP

## 2023-07-02 NOTE — Progress Notes (Addendum)
Incomplete Session Note  Patient Details  Name: [name] MRN: [id number] Date of Birth: 1946-10-23 Referring Provider:    [name] did not complete his rehab session.  Patient here for cardiac rehab orientation. Says he did not sleep well due to coughing and callous on right foot. Upon assessment. Lung fields essentially clear, slightly diminished left base otherwise clear upon auscultation. Temp 98.9. weight 255.9 lbs 116.1 kg. Right 2+ lower extremity  and ankle edema present. Left lower leg with trace edema. Blood pressure 108/68 heart rate 53. Patient does have a congestive non productive cough. Reports the congested non productive cough that started today. Patient and wife report some vomiting with dry heaves this morning. Denies feeling nauseated now. Telemetry rhythm Sinus with PAC's. Oxygen saturation 94% on room air.. Onsite provider Bernadene Person NP notified about symptoms. Dr Elvis Coil office called and notified about symptoms. Appointment obtained to see Dr Abbe Amsterdam tomorrow at 1pm. Will hold off on starting cardiac rehab until patient is feeling better. Patient and wife states understanding. Will cancel exercise appointments.Thayer Headings RN BSN

## 2023-07-02 NOTE — Progress Notes (Deleted)
Patient does have a congestive non productive cough. Reports a congested non productive cough that started today. Patient and wife report some vomiting with dry heaves this morning. Denies feeling nauseated now. Telemetry rhythm Sinus with PAC's. Oxygen saturation 94% on room air.. Onsite provider  Bernadene Person NP notified about symptoms. Dr Elvis Coil office called and notified about symptoms. Appointment obtained to see Dr Abbe Amsterdam tomorrow at 1pm. Will hold off on starting cardiac rehab until patient is feeling better. Patient and wife states understanding.

## 2023-07-02 NOTE — Telephone Encounter (Signed)
Pt returning nurse's call. Please advise

## 2023-07-02 NOTE — Progress Notes (Signed)
Cardiac Rehab Medication Review by a nurse  Does the patient  feel that his/her medications are working for him/her?  yes  Has the patient been experiencing any side effects to the medications prescribed?  no  Does the patient measure his/her own blood pressure or blood glucose at home?  yes   Does the patient have any problems obtaining medications due to transportation or finances?   no  Understanding of regimen: good Understanding of indications: good Potential of compliance: good    Nurse  comments: Al is taking his medications as prescribed. Al's wife helps him take his medications. Al checks his CBG's and blood pressures twice a day.    Arta Bruce Cashus Halterman RN 07/02/2023 1:55 PM

## 2023-07-02 NOTE — Telephone Encounter (Signed)
Left message to call back  

## 2023-07-03 ENCOUNTER — Telehealth (INDEPENDENT_AMBULATORY_CARE_PROVIDER_SITE_OTHER): Payer: Medicare Other | Admitting: Family Medicine

## 2023-07-03 ENCOUNTER — Encounter: Payer: Self-pay | Admitting: Family Medicine

## 2023-07-03 VITALS — BP 140/78 | HR 94 | Temp 99.2°F | Wt 255.4 lb

## 2023-07-03 DIAGNOSIS — R058 Other specified cough: Secondary | ICD-10-CM

## 2023-07-03 DIAGNOSIS — I5042 Chronic combined systolic (congestive) and diastolic (congestive) heart failure: Secondary | ICD-10-CM

## 2023-07-03 DIAGNOSIS — N1831 Chronic kidney disease, stage 3a: Secondary | ICD-10-CM

## 2023-07-03 DIAGNOSIS — U071 COVID-19: Secondary | ICD-10-CM

## 2023-07-03 DIAGNOSIS — I252 Old myocardial infarction: Secondary | ICD-10-CM

## 2023-07-03 LAB — POCT RAPID STREP A (OFFICE): Rapid Strep A Screen: NEGATIVE

## 2023-07-03 LAB — POC COVID19 BINAXNOW: SARS Coronavirus 2 Ag: POSITIVE — AB

## 2023-07-03 MED ORDER — BENZONATATE 200 MG PO CAPS: 200.0000 mg | ORAL_CAPSULE | Freq: Two times a day (BID) | ORAL | 0 refills | Status: DC | PRN

## 2023-07-03 MED ORDER — AMOXICILLIN-POT CLAVULANATE 875-125 MG PO TABS
1.0000 | ORAL_TABLET | Freq: Two times a day (BID) | ORAL | 0 refills | Status: AC
Start: 2023-07-03 — End: 2023-07-10

## 2023-07-03 NOTE — Progress Notes (Addendum)
Established Patient Office Visit.  Visit changed to video after arrival.   Subjective  Patient ID: [name], male    DOB: [birth year]  Age: 77 y.o. MRN: [id number]  Chief Complaint  Patient presents with   Cough    Off and on for 2 months. Went to cardiac rehab yesterday and they informed they wanted him to be tested for COVID, and to be cecked for pneumonia. Pt also wanted to be tested for strep.   Pt accompanied by his wife.  Pt is  a 48 yo m followed by Dr. [place] seen for ongoing concern.  Intermittent cough x 2 months.  Pt feels like cough got worse after hospitalization for NSTEMI.  Most recent sx started 2 wks ago with a mild HA across frontal area, fatigue, rhinorrhea, and ST.   Pt did have an episode of n/v in the beginning but none since.  Appetite isn't really good.  On 64 oz fluid restriction for CHF.  Temp elevated at 99.25F, typically 63F.  Pt concerned about pna.    Past Medical History:  Diagnosis Date   A-fib (HCC) 03/04/2021   Anemia    Arthritis    Cancer (HCC)    prostate   Chronic kidney disease    blood in urine    Constipation    Coronary artery disease    Depression    Diabetes mellitus without complication (HCC)    Difficult intubation    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 it was hard to get the tube down his throat   Dyspnea    Family history of breast cancer    Fatty liver    Fatty liver    Frequent headaches    GERD (gastroesophageal reflux disease)    History of chicken pox    History of fainting spells of unknown cause    History of prostate cancer    Hyperlipidemia    Hypertension    Myocardial infarction [facility])    Peripheral neuropathy    Pneumonia    Prostate cancer (HCC)    PTSD (post-traumatic stress disorder)    Sleep apnea    uses Cpap   Past Surgical History:  Procedure Laterality Date   APPENDECTOMY     BIOPSY  01/15/2021   Procedure: BIOPSY;  Surgeon: Lemar Lofty., MD;  Location: [facility] ENDOSCOPY;  Service:  Gastroenterology;;   CARDIAC CATHETERIZATION  06/26/2017   CARDIAC SURGERY     Triple Bypass   CHOLECYSTECTOMY  2010   COLONOSCOPY     CORONARY ARTERY BYPASS GRAFT  2004   ESOPHAGOGASTRODUODENOSCOPY (EGD) WITH PROPOFOL N/A 01/15/2021   Procedure: ESOPHAGOGASTRODUODENOSCOPY (EGD) WITH PROPOFOL;  Surgeon: Lemar Lofty., MD;  Location: [facility] ENDOSCOPY;  Service: Gastroenterology;  Laterality: N/A;   EUS N/A 01/15/2021   Procedure: UPPER ENDOSCOPIC ULTRASOUND (EUS) RADIAL;  Surgeon: Lemar Lofty., MD;  Location: [facility] ENDOSCOPY;  Service: Gastroenterology;  Laterality: N/A;   EYE SURGERY Bilateral    cataract removal   FINE NEEDLE ASPIRATION  01/15/2021   Procedure: FINE NEEDLE ASPIRATION (FNA) LINEAR;  Surgeon: Lemar Lofty., MD;  Location: [facility] ENDOSCOPY;  Service: Gastroenterology;;   LAPAROSCOPY N/A 07/01/2021   Procedure: STAGING LAPAROSCOPY;  Surgeon: Fritzi Mandes, MD;  Location: [facility] OR;  Service: General;  Laterality: N/A;   LEFT HEART CATH AND CORS/GRAFTS ANGIOGRAPHY N/A 06/09/2023   Procedure: LEFT HEART CATH AND CORS/GRAFTS ANGIOGRAPHY;  Surgeon: [name], MD;  Location: [facility] INVASIVE CV LAB;  Service: Cardiovascular;  Laterality:  N/A;   PORTACATH PLACEMENT Right 02/21/2021   Procedure: INSERTION PORT-A-CATH;  Surgeon: Fritzi Mandes, MD;  Location: [facility] OR;  Service: General;  Laterality: Right;   SPLENECTOMY, 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   Procedure: SPLENECTOMY;  Surgeon: Fritzi Mandes, MD;  Location: [facility] OR;  Service: General;  Laterality: N/A;   TONSILLECTOMY  1958   Social History   Tobacco Use   Smoking status: Former    Current packs/day: 0.00    Types: Cigarettes    Quit date: 12/14/1975    Years since quitting: 47.5   Smokeless tobacco: Never  Substance Use Topics   Alcohol use: Not Currently   Drug use: Never   Family History  Problem Relation Age of Onset   Arthritis Mother    Heart disease Mother    Hyperlipidemia Mother    Hypertension  Mother    Heart attack Mother    Breast cancer Sister        dx 30s   Heart attack Brother    Heart disease Brother    Depression Daughter    Arthritis Sister    Cancer Sister        unknown type, dx 14s   Cancer Brother    Learning disabilities Brother    Alcohol abuse Brother    COPD Brother    Heart attack Brother    Heart disease Brother    Allergies  Allergen Reactions   Keflex [Cephalexin] Other (See Comments)    Hallucinations?   Lisinopril Other (See Comments)    Unknown   Terazosin Other (See Comments)    Unknown      ROS Negative unless stated above    Objective:     BP (!) 140/78 (BP Location: Right Arm, Patient Position: Sitting, Cuff Size: Large)   Pulse 94   Temp 99.2 F (37.3 C) (Oral)   Wt 255 lb 6.4 oz (115.8 kg)   SpO2 94%   BMI 35.62 kg/m    Physical Exam Pt sounds pleasant, in NAD, no increased WOB.  Intermittent cough.  Results for orders placed or performed in visit on 07/03/23  POC Rapid Strep A  Result Value Ref Range   Rapid Strep A Screen Negative Negative  POC COVID-19  Result Value Ref Range   SARS Coronavirus 2 Ag Positive (A) Negative      Assessment & Plan:  COVID-19 virus infection -     Amoxicillin-Pot Clavulanate; Take 1 tablet by mouth 2 (two) times daily for 7 days.  Dispense: 14 tablet; Refill: 0  Other cough -     POCT rapid strep A -     POC COVID-19 BinaxNow -     Amoxicillin-Pot Clavulanate; Take 1 tablet by mouth 2 (two) times daily for 7 days.  Dispense: 14 tablet; Refill: 0 -     Benzonatate; Take 1 capsule (200 mg total) by mouth 2 (two) times daily as needed for cough.  Dispense: 20 capsule; Refill: 0  Chronic combined systolic and diastolic heart failure (HCC)  History of non-ST elevation myocardial infarction (NSTEMI)  Stage 3a chronic kidney disease (HCC)   Positive COVID test in clinic with symptoms starting 2 wks or more ago.  Given duration of sx antiviral meds not likely to provide benefit.   Will start abx for resp pathogens.  Unable to obtain CXR as system down due to international [employer] cloud outage.  Given strict precautions.  Return if symptoms worsen or fail to improve.   Deeann Saint,  MD

## 2023-07-08 ENCOUNTER — Ambulatory Visit (HOSPITAL_COMMUNITY): Payer: Medicare Other

## 2023-07-08 ENCOUNTER — Ambulatory Visit (HOSPITAL_COMMUNITY)
Admission: RE | Admit: 2023-07-08 | Discharge: 2023-07-08 | Disposition: A | Discharge status: Home / Self Care | Payer: Medicare Other | Source: Ambulatory Visit | Attending: Cardiology | Admitting: Cardiology

## 2023-07-08 DIAGNOSIS — I5032 Chronic diastolic (congestive) heart failure: Secondary | ICD-10-CM | POA: Insufficient documentation

## 2023-07-08 LAB — BASIC METABOLIC PANEL
Anion gap: 10 (ref 5–15)
BUN: 33 mg/dL — ABNORMAL HIGH (ref 8–23)
CO2: 25 mmol/L (ref 22–32)
Calcium: 9 mg/dL (ref 8.9–10.3)
Chloride: 102 mmol/L (ref 98–111)
Creatinine, Ser: 1.71 mg/dL — ABNORMAL HIGH (ref 0.61–1.24)
GFR, Estimated: 40 mL/min — ABNORMAL LOW (ref >60)
Glucose, Bld: 219 mg/dL — ABNORMAL HIGH (ref 70–99)
Potassium: 3.6 mmol/L (ref 3.5–5.1)
Sodium: 137 mmol/L (ref 135–145)

## 2023-07-10 ENCOUNTER — Ambulatory Visit (HOSPITAL_COMMUNITY): Payer: Medicare Other

## 2023-07-13 ENCOUNTER — Ambulatory Visit (HOSPITAL_COMMUNITY): Payer: Medicare Other

## 2023-07-15 ENCOUNTER — Ambulatory Visit (HOSPITAL_COMMUNITY): Payer: Medicare Other

## 2023-07-17 ENCOUNTER — Ambulatory Visit (HOSPITAL_COMMUNITY): Payer: Medicare Other

## 2023-07-20 ENCOUNTER — Ambulatory Visit (HOSPITAL_COMMUNITY): Payer: Medicare Other

## 2023-07-20 DIAGNOSIS — I2510 Atherosclerotic heart disease of native coronary artery without angina pectoris: Secondary | ICD-10-CM | POA: Diagnosis not present

## 2023-07-20 DIAGNOSIS — I129 Hypertensive chronic kidney disease with stage 1 through stage 4 chronic kidney disease, or unspecified chronic kidney disease: Secondary | ICD-10-CM | POA: Diagnosis not present

## 2023-07-20 DIAGNOSIS — N390 Urinary tract infection, site not specified: Secondary | ICD-10-CM | POA: Diagnosis not present

## 2023-07-20 DIAGNOSIS — N1831 Chronic kidney disease, stage 3a: Secondary | ICD-10-CM | POA: Diagnosis not present

## 2023-07-20 DIAGNOSIS — E1122 Type 2 diabetes mellitus with diabetic chronic kidney disease: Secondary | ICD-10-CM | POA: Diagnosis not present

## 2023-07-22 ENCOUNTER — Ambulatory Visit (HOSPITAL_COMMUNITY): Payer: Medicare Other

## 2023-07-23 ENCOUNTER — Ambulatory Visit: Payer: Medicare Other | Admitting: Podiatry

## 2023-07-23 ENCOUNTER — Ambulatory Visit (INDEPENDENT_AMBULATORY_CARE_PROVIDER_SITE_OTHER): Payer: Medicare Other | Admitting: Podiatry

## 2023-07-23 DIAGNOSIS — M216X9 Other acquired deformities of unspecified foot: Secondary | ICD-10-CM

## 2023-07-23 DIAGNOSIS — N183 Chronic kidney disease, stage 3 unspecified: Secondary | ICD-10-CM | POA: Diagnosis not present

## 2023-07-23 DIAGNOSIS — E1122 Type 2 diabetes mellitus with diabetic chronic kidney disease: Secondary | ICD-10-CM

## 2023-07-23 DIAGNOSIS — M21621 Bunionette of right foot: Secondary | ICD-10-CM

## 2023-07-23 DIAGNOSIS — Z794 Long term (current) use of insulin: Secondary | ICD-10-CM | POA: Diagnosis not present

## 2023-07-23 DIAGNOSIS — M21622 Bunionette of left foot: Secondary | ICD-10-CM | POA: Diagnosis not present

## 2023-07-23 NOTE — Progress Notes (Signed)
  Subjective:  Patient ID: [name], male    DOB: 10/31/1946,  MRN: [id number]  Chief Complaint  Patient presents with   Nail Problem    DFC at risk, does not want nails trimmed today   Callouses    Right foot underneath 5 th toe,is sore    77 y.o. male presents with the above complaint. History confirmed with patient. He returns for follow up nail is much better after debridement  Objective:  Physical Exam: warm, good capillary refill, no trophic changes or ulcerative lesions, normal DP and PT pulses, normal sensory exam, and pain and tenderness submetatarsal 5 R foot no active ulceration Assessment:   1. Plantar flexed metatarsal, unspecified laterality   2. Tailor's bunion of both feet   3. Type 2 diabetes mellitus with stage 3 chronic kidney disease, with long-term current use of insulin, unspecified whether stage 3a or 3b CKD (HCC)      Plan:  Patient was evaluated and treated and all questions answered.  Feet were inspected no active ulcerations. Nails did not require debridement today. We received his custom molded multidensity insoles, we modified them today with further offloading under the fifth metatarsal and then recessed 4) function.  He was pleased with visit.  He will let me know if there are any further issues.  He will be scheduled again for his at risk diabetic footcare in 3 months with Dr. Burna Mortimer per his request.  Return in about 3 months (around 10/23/2023) for at risk diabetic foot care.

## 2023-07-24 ENCOUNTER — Ambulatory Visit (HOSPITAL_COMMUNITY): Payer: Medicare Other

## 2023-07-27 ENCOUNTER — Ambulatory Visit (HOSPITAL_COMMUNITY): Payer: Medicare Other

## 2023-07-29 ENCOUNTER — Ambulatory Visit (HOSPITAL_COMMUNITY): Payer: Medicare Other

## 2023-07-31 ENCOUNTER — Ambulatory Visit (HOSPITAL_COMMUNITY): Payer: Medicare Other

## 2023-08-03 ENCOUNTER — Ambulatory Visit (HOSPITAL_COMMUNITY): Payer: Medicare Other

## 2023-08-03 DIAGNOSIS — R300 Dysuria: Secondary | ICD-10-CM | POA: Diagnosis not present

## 2023-08-03 DIAGNOSIS — R3915 Urgency of urination: Secondary | ICD-10-CM | POA: Diagnosis not present

## 2023-08-05 ENCOUNTER — Encounter (HOSPITAL_COMMUNITY): Payer: Self-pay

## 2023-08-05 ENCOUNTER — Ambulatory Visit (HOSPITAL_COMMUNITY): Payer: Medicare Other

## 2023-08-07 ENCOUNTER — Inpatient Hospital Stay: Payer: Medicare Other | Attending: Nurse Practitioner

## 2023-08-07 ENCOUNTER — Inpatient Hospital Stay: Payer: Medicare Other | Admitting: Oncology

## 2023-08-07 ENCOUNTER — Inpatient Hospital Stay: Payer: Medicare Other

## 2023-08-07 ENCOUNTER — Ambulatory Visit (HOSPITAL_COMMUNITY): Payer: Medicare Other

## 2023-08-07 VITALS — BP 140/72 | HR 60 | Temp 98.1°F | Resp 18 | Ht 71.0 in | Wt 255.1 lb

## 2023-08-07 DIAGNOSIS — R102 Pelvic and perineal pain: Secondary | ICD-10-CM | POA: Diagnosis not present

## 2023-08-07 DIAGNOSIS — C251 Malignant neoplasm of body of pancreas: Secondary | ICD-10-CM

## 2023-08-07 DIAGNOSIS — Z8507 Personal history of malignant neoplasm of pancreas: Secondary | ICD-10-CM | POA: Diagnosis not present

## 2023-08-07 DIAGNOSIS — Z95828 Presence of other vascular implants and grafts: Secondary | ICD-10-CM

## 2023-08-07 LAB — CMP (CANCER CENTER ONLY)
ALT: 19 U/L (ref 0–44)
AST: 27 U/L (ref 15–41)
Albumin: 3.8 g/dL (ref 3.5–5.0)
Alkaline Phosphatase: 111 U/L (ref 38–126)
Anion gap: 8 (ref 5–15)
BUN: 29 mg/dL — ABNORMAL HIGH (ref 8–23)
CO2: 26 mmol/L (ref 22–32)
Calcium: 9.6 mg/dL (ref 8.9–10.3)
Chloride: 103 mmol/L (ref 98–111)
Creatinine: 1.46 mg/dL — ABNORMAL HIGH (ref 0.61–1.24)
GFR, Estimated: 49 mL/min — ABNORMAL LOW (ref >60)
Glucose, Bld: 231 mg/dL — ABNORMAL HIGH (ref 70–99)
Potassium: 4.3 mmol/L (ref 3.5–5.1)
Sodium: 137 mmol/L (ref 135–145)
Total Bilirubin: 0.7 mg/dL (ref 0.3–1.2)
Total Protein: 6.9 g/dL (ref 6.5–8.1)

## 2023-08-07 LAB — CBC WITH DIFFERENTIAL (CANCER CENTER ONLY)
Abs Immature Granulocytes: 0.04 10*3/uL (ref 0.00–0.07)
Basophils Absolute: 0.1 10*3/uL (ref 0.0–0.1)
Basophils Relative: 1 %
Eosinophils Absolute: 0.1 10*3/uL (ref 0.0–0.5)
Eosinophils Relative: 1 %
HCT: 39.5 % (ref 39.0–52.0)
Hemoglobin: 13.1 g/dL (ref 13.0–17.0)
Immature Granulocytes: 0 %
Lymphocytes Relative: 35 %
Lymphs Abs: 3.3 10*3/uL (ref 0.7–4.0)
MCH: 32.5 pg (ref 26.0–34.0)
MCHC: 33.2 g/dL (ref 30.0–36.0)
MCV: 98 fL (ref 80.0–100.0)
Monocytes Absolute: 0.8 10*3/uL (ref 0.1–1.0)
Monocytes Relative: 8 %
Neutro Abs: 5.2 10*3/uL (ref 1.7–7.7)
Neutrophils Relative %: 55 %
Platelet Count: 257 10*3/uL (ref 150–400)
RBC: 4.03 MIL/uL — ABNORMAL LOW (ref 4.22–5.81)
RDW: 14.5 % (ref 11.5–15.5)
WBC Count: 9.4 10*3/uL (ref 4.0–10.5)
nRBC: 0 % (ref 0.0–0.2)

## 2023-08-07 MED ORDER — HEPARIN SOD (PORK) LOCK FLUSH 100 UNIT/ML IV SOLN
500.0000 [IU] | Freq: Once | INTRAVENOUS | Status: AC
  Administered 2023-08-07: 500 [IU] via INTRAVENOUS

## 2023-08-07 MED ORDER — SODIUM CHLORIDE 0.9% FLUSH
10.0000 mL | INTRAVENOUS | Status: DC | PRN
  Administered 2023-08-07: 10 mL via INTRAVENOUS

## 2023-08-07 NOTE — Progress Notes (Signed)
[facility] OFFICE PROGRESS NOTE   Diagnosis: Pancreas cancer  INTERVAL HISTORY:   Mr. Loughnane returns as scheduled.  He generally feels well.  He reports discomfort at the right lower abdomen/pelvis for the past month.  He was admitted on 06/08/2023 with an NSTEMI.  He underwent cardiac catheterization.  Multiple areas of occlusion were noted.  Medical therapy was recommended.  He plans to begin a cardiac rehabilitation program.  Objective:  Vital signs in last 24 hours:  Blood pressure (!) 140/72, pulse 60, temperature 98.1 F (36.7 C), temperature source Oral, resp. rate 18, height 5\' 11"  (1.803 m), weight 255 lb 1.6 oz (115.7 kg), SpO2 100%.    Lymphatics: No cervical, supraclavicular, axillary, or inguinal nodes Resp: Lungs clear bilaterally Cardio: Regular rate and rhythm GI: No mass, no hepatosplenomegaly, no apparent ascites, no abdominal tenderness, tender at the medial side of the right anterior iliac.  No mass. Vascular: No leg edema, faint erythema at the right low pretibial area    Portacath/PICC-without erythema  Lab Results:  Lab Results  Component Value Date   WBC 9.4 08/07/2023   HGB 13.1 08/07/2023   HCT 39.5 08/07/2023   MCV 98.0 08/07/2023   PLT 257 08/07/2023   NEUTROABS 5.2 08/07/2023    CMP  Lab Results  Component Value Date   NA 137 08/07/2023   K 4.3 08/07/2023   CL 103 08/07/2023   CO2 26 08/07/2023   GLUCOSE 231 (H) 08/07/2023   BUN 29 (H) 08/07/2023   CREATININE 1.46 (H) 08/07/2023   CALCIUM 9.6 08/07/2023   PROT 6.9 08/07/2023   ALBUMIN 3.8 08/07/2023   AST 27 08/07/2023   ALT 19 08/07/2023   ALKPHOS 111 08/07/2023   BILITOT 0.7 08/07/2023   GFRNONAA 49 (L) 08/07/2023   GFRAA 49 (L) 10/10/2020    Lab Results  Component Value Date   CAN199 19 04/17/2023    Lab Results  Component Value Date   INR 1.3 (H) 06/08/2023   LABPROT 16.6 (H) 06/08/2023    Imaging:  No results found.  Medications: I have  reviewed the patient's current medications.   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  No pancreatic ductal dilatation identified.  No definite signs of metastatic disease in the chest, abdomen or pelvis. Cycle 1 gemcitabine/Abraxane 03/01/2021 Cycle 2 gemcitabine/Abraxane 03/14/2021 Cycle 3 gemcitabine/Abraxane 03/28/2021 Cycle 4 gemcitabine/Abraxane 04/17/2021 Cycle 5 gemcitabine/Abraxane 05/01/2021, Zofran/Decadron added Cycle 6 gemcitabine/Abraxane 05/15/2021 CT pancreas protocol 05/27/2021-unchanged 8 mm exophytic low-attenuation lesion at the posterior body of the pancreas 07/01/2021-distal pancreatectomy/splenectomy, 0.8 cm poorly differentiated adenocarcinoma, treatment response score-2, largest single foci of remaining tumor 0.2 cm, negative resection margins, 0/6 lymph nodes,ypT1bypNo, PanIN-1b Cycle 7 gemcitabine/Abraxane 08/14/2021 Cycle 8 gemcitabine/Abraxane 09/04/2021 Cycle 9 gemcitabine/Abraxane 09/18/2021 Cycle 10 gemcitabine/Abraxane 10/01/2021 Cycle 11 gemcitabine/Abraxane 10/16/2021 Cycle 12 gemcitabine/Abraxane 10/29/2021, Abraxane held secondary to neuropathy CT abdomen/pelvis 02/06/2022-acute interstitial edematous pancreatitis with small 1.6 cm acute peripancreatic fluid collection within the superior aspect of the pancreatic head; interval postoperative changes of distal pancreatectomy  and  splenectomy; stable benign left adrenal adenoma Diabetes Coronary artery disease NSTEMI 06/08/2023 Prostate cancer 2013-treated with radiation at [facility] Hypertension Sleep apnea 7.  Coronary artery bypass surgery 2004 8.  Hospital admission 03/04/2021-syncope, A. Fib-started on Eliquis 9.  Mild thrombocytopenia following cycle 1 gemcitabine/Abraxane-resolved 10.  Anemia 11.  Admission 09/22/2021 with a right metatarsal ulcer and right leg cellulitis 12.  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  Renal insufficiency 14.  Right fifth toe callus/ulcer followed by podiatry and the wound clinic     Disposition: Mr. Oathout remains in clinical remission from pancreas cancer.  We will follow-up on the CA 19-9 from today.  A Port-A-Cath remains in place.  He would like to keep the Port-A-Cath.  He will return in 8 weeks in 16 weeks for a Port-A-Cath flush.  He will be scheduled for an office visit in 24 weeks.  He will contact us if the right pelvic pain persists.  Thornton Papas, MD  08/07/2023  11:56 AM

## 2023-08-08 LAB — CANCER ANTIGEN 19-9: CA 19-9: 23 U/mL (ref 0–35)

## 2023-08-10 ENCOUNTER — Ambulatory Visit (HOSPITAL_COMMUNITY): Payer: Medicare Other

## 2023-08-11 ENCOUNTER — Encounter (HOSPITAL_COMMUNITY)
Admission: RE | Admit: 2023-08-11 | Discharge: 2023-08-11 | Disposition: A | Discharge status: Home / Self Care | Payer: Medicare Other | Source: Ambulatory Visit | Attending: Cardiology | Admitting: Cardiology

## 2023-08-11 VITALS — BP 130/66 | HR 57 | Ht 70.75 in | Wt 255.3 lb

## 2023-08-11 DIAGNOSIS — I214 Non-ST elevation (NSTEMI) myocardial infarction: Secondary | ICD-10-CM | POA: Insufficient documentation

## 2023-08-11 LAB — GLUCOSE, CAPILLARY: Glucose-Capillary: 288 mg/dL — ABNORMAL HIGH (ref 70–99)

## 2023-08-11 NOTE — Progress Notes (Signed)
Cardiac Individual Treatment Plan  Patient Details  Name: [name] MRN: [id number] Date of Birth: 03-25-1946 Referring Provider:   Flowsheet Row INTENSIVE CARDIAC REHAB ORIENT from 08/11/2023 in [facility]  Referring Provider Jodelle Red, MD       Initial Encounter Date:  Flowsheet Row INTENSIVE CARDIAC REHAB ORIENT from 08/11/2023 in [facility]  Date 08/11/23       Visit Diagnosis: 06/08/23 NSTEMI (non-ST elevated myocardial infarction) [facility])  Patient's Home Medications on Admission:  Current Outpatient Medications:    acetaminophen (TYLENOL) 500 MG tablet, Take 1,000 mg by mouth in the morning, at noon, and at bedtime., Disp: , Rfl:    amLODipine (NORVASC) 10 MG tablet, Take 5 mg by mouth daily., Disp: , Rfl:    apixaban (ELIQUIS) 5 MG TABS tablet, Take 5 mg by mouth 2 (two) times daily., Disp: , Rfl:    aspirin EC 81 MG tablet, Take 1 tablet (81 mg total) by mouth daily then as directed by provider. Swallow whole., Disp: 90 tablet, Rfl: 3   atorvastatin (LIPITOR) 80 MG tablet, Take 40 mg by mouth at bedtime., Disp: , Rfl:    calcium-vitamin D (OSCAL WITH D) 500-200 MG-UNIT tablet, Take 1 tablet by mouth 2 (two) times daily., Disp: , Rfl:    CRANBERRY PO, Take 1 tablet by mouth in the morning and at bedtime., Disp: , Rfl:    empagliflozin (JARDIANCE) 10 MG TABS tablet, Take 1 tablet (10 mg total) by mouth daily., Disp: 90 tablet, Rfl: 3   ferrous sulfate 325 (65 FE) MG tablet, Take 1 tablet (325 mg total) by mouth daily with breakfast., Disp: 90 tablet, Rfl: 2   FLUoxetine (PROZAC) 20 MG capsule, Take 40 mg by mouth daily., Disp: , Rfl:    furosemide (LASIX) 40 MG tablet, Take 1 tablet (40 mg total) by mouth daily., Disp: 90 tablet, Rfl: 3   glucose blood (ONETOUCH VERIO) test strip, Use to test blood sugars 1-2 times daily., Disp: 200 each,  Rfl: 12   insulin aspart (NOVOLOG) 100 UNIT/ML injection, Inject 3-5 Units into the skin See admin instructions. Inject 3 units at lunch time and 5 units at dinner, Disp: , Rfl:    insulin glargine (LANTUS) 100 UNIT/ML injection, Inject 0.45 mLs (45 Units total) into the skin daily. (Patient taking differently: Inject 50 Units into the skin daily.), Disp: 10 mL, Rfl: 3   metoprolol tartrate (LOPRESSOR) 25 MG tablet, TAKE 1/2 TABLET(12.5 MG) BY MOUTH TWICE DAILY, Disp: 90 tablet, Rfl: 3   Multiple Vitamin (MULTI-VITAMINS) TABS, Take 1 tablet by mouth daily., Disp: , Rfl:    nitroGLYCERIN (NITROSTAT) 0.3 MG SL tablet, Place 0.3 mg under the tongue every 5 (five) minutes as needed for chest pain., Disp: , Rfl:    pantoprazole (PROTONIX) 40 MG tablet, Take 1 tablet (40 mg total) by mouth daily., Disp: 90 tablet, Rfl: 3   Probiotic Product (PROBIOTIC ADVANCED PO), Take 1 capsule by mouth at bedtime., Disp: , Rfl:    tamsulosin (FLOMAX) 0.4 MG CAPS capsule, Take 0.8 mg by mouth at bedtime., Disp: , Rfl:    traZODone (DESYREL) 100 MG tablet, Take 100 mg by mouth at bedtime., Disp: , Rfl:    vitamin B-12 (CYANOCOBALAMIN) 1000 MCG tablet, Take 1,000 mcg by mouth daily., Disp: , Rfl:    Wheat Dextrin (BENEFIBER DRINK MIX PO), Take 1 Scoop by mouth  at bedtime., Disp: , Rfl:    benzonatate (TESSALON) 200 MG capsule, Take 1 capsule (200 mg total) by mouth 2 (two) times daily as needed for cough. (Patient not taking: Reported on 08/07/2023), Disp: 20 capsule, Rfl: 0   Calcium Carbonate Antacid (TUMS PO), Take 2 tablets by mouth as needed (reflux). 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 down his throat   Dyspnea    Family history of breast cancer    Fatty liver    Fatty liver    Frequent headaches    GERD (gastroesophageal reflux disease)    History of chicken pox    History of fainting spells of unknown cause    History of prostate cancer    Hyperlipidemia    Hypertension    Myocardial infarction (HCC)    Peripheral neuropathy    Pneumonia    Prostate cancer (HCC)    PTSD (post-traumatic stress disorder)    Sleep apnea    uses Cpap    Tobacco Use: Social History   Tobacco Use  Smoking Status Former   Current packs/day: 0.00   Types: Cigarettes   Quit date: 12/14/1975   Years since quitting: 47.6  Smokeless Tobacco Never    Labs: Review Flowsheet  More data exists      Latest Ref Rng & Units 02/21/2022 04/08/2022 07/07/2022 12/10/2022 06/09/2023  Labs for ITP Cardiac and Pulmonary Rehab  Cholestrol 0 - 200 mg/dL 403  - - - 95   LDL (calc) 0 - 99 mg/dL 66  - - - 47   HDL-C >47 mg/dL 42.59  - - - 40   Trlycerides <150 mg/dL 563.8  - - - 42   Hemoglobin A1c 4.8 - 5.6 % - 9.9  6.8  6.8  7.5     Details            Capillary Blood Glucose: Lab Results  Component Value Date   GLUCAP 288 (H) 08/11/2023   GLUCAP 187 (H) 06/10/2023   GLUCAP 73 06/10/2023   GLUCAP 193 (H) 06/09/2023   GLUCAP 68 (L) 06/09/2023     Exercise Target Goals: Exercise Program Goal: Individual exercise prescription set using results from initial 6 min walk test and THRR while considering  patient's activity barriers and safety.   Exercise Prescription Goal: Initial exercise prescription builds to 30-45 minutes a day of aerobic activity, 2-3 days per week.  Home exercise guidelines will be given to patient during program as part of exercise prescription that the participant will acknowledge.  Activity Barriers & Risk Stratification:  Activity Barriers & Cardiac Risk Stratification - 08/11/23 1218        Activity Barriers & Cardiac Risk Stratification   Activity Barriers Arthritis;Back Problems;Joint Problems;Deconditioning;Muscular Weakness;Shortness of Breath;Balance Concerns;History of Falls;Assistive Device;Other (comment)    Comments Neuropathy in the feet    Cardiac Risk Stratification High             6 Minute Walk:  6 Minute Walk  Row Name 08/11/23 1045         6 Minute Walk   Phase Initial  Used rollator     Distance 600 feet  Used rollator     Walk Time 6 minutes     # of Rest Breaks 0     MPH 1.14     METS 1     RPE 13     Perceived Dyspnea  2     VO2 Peak 3.4     Symptoms Yes (comment)     Comments SOB, RPD = 2, chronic Rt hip pain 3/10, legs tired, arms tired from leaning on rollator     Resting HR 65 bpm     Resting BP 130/66     Resting Oxygen Saturation  95 %     Exercise Oxygen Saturation  during 6 min walk 95 %     Max Ex. HR 103 bpm     Max Ex. BP 154/68     2 Minute Post BP 148/74              Oxygen Initial Assessment:   Oxygen Re-Evaluation:   Oxygen Discharge (Final Oxygen Re-Evaluation):   Initial Exercise Prescription:  Initial Exercise Prescription - 08/11/23 1200       Date of Initial Exercise RX and Referring Provider   Date 08/11/23    Referring Provider Jodelle Red, MD    Expected Discharge Date 11/04/23      T5 Nustep   Level 1    SPM 60    Minutes 25    METs 1      Prescription Details   Frequency (times per week) 3    Duration Progress to 30 minutes of continuous aerobic without signs/symptoms of physical distress      Intensity   THRR 40-80% of Max Heartrate 57 - 114    Ratings of Perceived Exertion 11-13    Perceived Dyspnea 0-4      Progression   Progression Continue progressive overload as per policy without signs/symptoms or physical distress.      Resistance Training   Training Prescription Yes    Weight 3 lbs    Reps 10-15             Perform Capillary Blood Glucose checks  as needed.  Exercise Prescription Changes:   Exercise Comments:   Exercise Goals and Review:   Exercise Goals     Row Name 07/02/23 1332 08/11/23 1258           Exercise Goals   Increase Physical Activity Yes Yes      Intervention Provide advice, education, support and counseling about physical activity/exercise needs.;Develop an individualized exercise prescription for aerobic and resistive training based on initial evaluation findings, risk stratification, comorbidities and participant's personal goals. Provide advice, education, support and counseling about physical activity/exercise needs.;Develop an individualized exercise prescription for aerobic and resistive training based on initial evaluation findings, risk stratification, comorbidities and participant's personal goals.      Expected Outcomes Short Term: Attend rehab on a regular basis to increase amount of physical activity.;Long Term: Exercising regularly at least 3-5 days a week.;Long Term: Add in home exercise to make exercise part of routine and to increase amount of physical activity. Short Term: Attend rehab on a regular basis to increase amount of physical activity.;Long Term: Exercising regularly at least 3-5 days a week.;Long Term: Add in home exercise to make exercise part of routine and to increase amount of physical  activity.      Increase Strength and Stamina Yes Yes      Intervention Provide advice, education, support and counseling about physical activity/exercise needs.;Develop an individualized exercise prescription for aerobic and resistive training based on initial evaluation findings, risk stratification, comorbidities and participant's personal goals. Provide advice, education, support and counseling about physical activity/exercise needs.;Develop an individualized exercise prescription for aerobic and resistive training based on initial evaluation findings, risk stratification, comorbidities and participant's  personal goals.      Expected Outcomes Short Term: Increase workloads from initial exercise prescription for resistance, speed, and METs.;Long Term: Improve cardiorespiratory fitness, muscular endurance and strength as measured by increased METs and functional capacity ( );Short Term: Perform resistance training exercises routinely during rehab and add in resistance training at home Short Term: Increase workloads from initial exercise prescription for resistance, speed, and METs.;Long Term: Improve cardiorespiratory fitness, muscular endurance and strength as measured by increased METs and functional capacity ( );Short Term: Perform resistance training exercises routinely during rehab and add in resistance training at home      Able to understand and use rate of perceived exertion (RPE) scale Yes Yes      Intervention Provide education and explanation on how to use RPE scale Provide education and explanation on how to use RPE scale      Expected Outcomes Short Term: Able to use RPE daily in rehab to express subjective intensity level;Long Term:  Able to use RPE to guide intensity level when exercising independently Short Term: Able to use RPE daily in rehab to express subjective intensity level;Long Term:  Able to use RPE to guide intensity level when exercising independently      Able to understand and use Dyspnea scale Yes --      Intervention Provide education and explanation on how to use Dyspnea scale Provide education and explanation on how to use Dyspnea scale      Expected Outcomes Short Term: Able to use Dyspnea scale daily in rehab to express subjective sense of shortness of breath during exertion;Long Term: Able to use Dyspnea scale to guide intensity level when exercising independently Short Term: Able to use Dyspnea scale daily in rehab to express subjective sense of shortness of breath during exertion;Long Term: Able to use Dyspnea scale to guide intensity level when exercising  independently      Knowledge and understanding of Target Heart Rate Range (THRR) Yes Yes      Intervention Provide education and explanation of THRR including how the numbers were predicted and where they are located for reference Provide education and explanation of THRR including how the numbers were predicted and where they are located for reference      Expected Outcomes Short Term: Able to state/look up THRR;Short Term: Able to use daily as guideline for intensity in rehab;Long Term: Able to use THRR to govern intensity when exercising independently Short Term: Able to state/look up THRR;Short Term: Able to use daily as guideline for intensity in rehab;Long Term: Able to use THRR to govern intensity when exercising independently      Understanding of Exercise Prescription Yes Yes      Intervention Provide education, explanation, and written materials on patient's individual exercise prescription Provide education, explanation, and written materials on patient's individual exercise prescription      Expected Outcomes Short Term: Able to explain program exercise prescription;Long Term: Able to explain home exercise prescription to exercise independently Short Term: Able to explain program exercise prescription;Long Term: Able to explain home exercise  prescription to exercise independently               Exercise Goals Re-Evaluation :   Discharge Exercise Prescription (Final Exercise Prescription Changes):   Nutrition:  Target Goals: Understanding of nutrition guidelines, daily intake of sodium 1500mg , cholesterol 200mg , calories 30% from fat and 7% or less from saturated fats, daily to have 5 or more servings of fruits and vegetables.  Biometrics:  Pre Biometrics - 08/11/23 0955       Pre Biometrics   Waist Circumference 46.5 inches    Hip Circumference 48.5 inches    Waist to Hip Ratio 0.96 %    Triceps Skinfold 25 mm    % Body Fat 35.7 %    Grip Strength 20 kg    Flexibility 0  in   Unable to reach   Single Leg Stand 0 seconds   Pt unable to perform due to poor balance             Nutrition Therapy Plan and Nutrition Goals:   Nutrition Assessments:  MEDIFICTS Score Key: ?70 Need to make dietary changes  40-70 Heart Healthy Diet ? 40 Therapeutic Level Cholesterol Diet    Picture Your Plate Scores: <16 Unhealthy dietary pattern with much room for improvement. 41-50 Dietary pattern unlikely to meet recommendations for good health and room for improvement. 51-60 More healthful dietary pattern, with some room for improvement.  >60 Healthy dietary pattern, although there may be some specific behaviors that could be improved.    Nutrition Goals Re-Evaluation:   Nutrition Goals Re-Evaluation:   Nutrition Goals Discharge (Final Nutrition Goals Re-Evaluation):   Psychosocial: Target Goals: Acknowledge presence or absence of significant depression and/or stress, maximize coping skills, provide positive support system. Participant is able to verbalize types and ability to use techniques and skills needed for reducing stress and depression.  Initial Review & Psychosocial Screening:  Initial Psych Review & Screening - 08/11/23 1215       Initial Review   Current issues with History of Depression;Current Psychotropic Meds;Current Depression      Family Dynamics   Good Support System? Yes   Has sposue, son daughter, and grandson for support     Barriers   Psychosocial barriers to participate in program The patient should benefit from training in stress management and relaxation.      Screening Interventions   Interventions Encouraged to exercise    Expected Outcomes Short Term goal: Identification and review with participant of any Quality of Life or Depression concerns found by scoring the questionnaire.;Long Term goal: The participant improves quality of Life and PHQ9 Scores as seen by post scores and/or verbalization of changes              Quality of Life Scores:  Quality of Life - 08/11/23 1259       Quality of Life   Select Quality of Life      Quality of Life Scores   Health/Function Pre 10.9 %    Socioeconomic Pre 25.21 %    Psych/Spiritual Pre 22.07 %    Family Pre 30 %    GLOBAL Pre 18.96 %            Scores of 19 and below usually indicate a poorer quality of life in these areas.  A difference of  2-3 points is a clinically meaningful difference.  A difference of 2-3 points in the total score of the Quality of Life Index has been associated with significant improvement  in overall quality of life, self-image, physical symptoms, and general health in studies assessing change in quality of life.  PHQ-9: Review Flowsheet  More data exists      08/11/2023 06/12/2023 12/16/2022 12/10/2022 07/07/2022  Depression screen PHQ 2/9  Decreased Interest 2 3 0 0 0  Down, Depressed, Hopeless 0 2 0 0 0  PHQ - 2 Score 2 5 0 0 0  Altered sleeping 0 0 0 0 0  Tired, decreased energy 2 3 0 2 0  Change in appetite 1 3 0 3 0  Feeling bad or failure about yourself  0 3 0 0 0  Trouble concentrating 1 3 0 0 0  Moving slowly or fidgety/restless 0 0 0 0 0  Suicidal thoughts 0 0 0 0 0  PHQ-9 Score 6 17 0 5 0  Difficult doing work/chores Somewhat difficult Very difficult Not difficult at all Not difficult at all -    Details           Interpretation of Total Score  Total Score Depression Severity:  1-4 = Minimal depression, 5-9 = Mild depression, 10-14 = Moderate depression, 15-19 = Moderately severe depression, 20-27 = Severe depression   Psychosocial Evaluation and Intervention:   Psychosocial Re-Evaluation:   Psychosocial Discharge (Final Psychosocial Re-Evaluation):   Vocational Rehabilitation: Provide vocational rehab assistance to qualifying candidates.   Vocational Rehab Evaluation & Intervention:  Vocational Rehab - 08/11/23 1217       Initial Vocational Rehab Evaluation & Intervention   Assessment  shows need for Vocational Rehabilitation No   Pt is retired            Education: Education Goals: Education classes will be provided on a weekly basis, covering required topics. Participant will state understanding/return demonstration of topics presented.     Core Videos: Exercise    Move It!  Clinical staff conducted group or individual video education with verbal and written material and guidebook.  Patient learns the recommended Pritikin exercise program. Exercise with the goal of living a long, healthy life. Some of the health benefits of exercise include controlled diabetes, healthier blood pressure levels, improved cholesterol levels, improved heart and lung capacity, improved sleep, and better body composition. Everyone should speak with their doctor before starting or changing an exercise routine.  Biomechanical Limitations Clinical staff conducted group or individual video education with verbal and written material and guidebook.  Patient learns how biomechanical limitations can impact exercise and how we can mitigate and possibly overcome limitations to have an impactful and balanced exercise routine.  Body Composition Clinical staff conducted group or individual video education with verbal and written material and guidebook.  Patient learns that body composition (ratio of muscle mass to fat mass) is a key component to assessing overall fitness, rather than body weight alone. Increased fat mass, especially visceral belly fat, can put [place] at increased risk for metabolic syndrome, type 2 diabetes, heart disease, and even death. It is recommended to combine diet and exercise (cardiovascular and resistance training) to improve your body composition. Seek guidance from your physician and exercise physiologist before implementing an exercise routine.  Exercise Action Plan Clinical staff conducted group or individual video education with verbal and written material and guidebook.   Patient learns the recommended strategies to achieve and enjoy long-term exercise adherence, including variety, self-motivation, self-efficacy, and positive decision making. Benefits of exercise include fitness, good health, weight management, more energy, better sleep, less stress, and overall well-being.  Medical   Heart Disease  Risk Reduction Clinical staff conducted group or individual video education with verbal and written material and guidebook.  Patient learns our heart is our most vital organ as it circulates oxygen, nutrients, white blood cells, and hormones throughout the entire body, and carries waste away. Data supports a plant-based eating plan like the Pritikin Program for its effectiveness in slowing progression of and reversing heart disease. The video provides a number of recommendations to address heart disease.   Metabolic Syndrome and Belly Fat  Clinical staff conducted group or individual video education with verbal and written material and guidebook.  Patient learns what metabolic syndrome is, how it leads to heart disease, and how one can reverse it and keep it from coming back. You have metabolic syndrome if you have 3 of the following 5 criteria: abdominal obesity, high blood pressure, high triglycerides, low HDL cholesterol, and high blood sugar.  Hypertension and Heart Disease Clinical staff conducted group or individual video education with verbal and written material and guidebook.  Patient learns that high blood pressure, or hypertension, is very common in the [place]. Hypertension is largely due to excessive salt intake, but other important risk factors include being overweight, physical inactivity, drinking too much alcohol, smoking, and not eating enough potassium from fruits and vegetables. High blood pressure is a leading risk factor for heart attack, stroke, congestive heart failure, dementia, kidney failure, and premature death. Long-term effects of  excessive salt intake include stiffening of the arteries and thickening of heart muscle and organ damage. Recommendations include ways to reduce hypertension and the risk of heart disease.  Diseases of Our Time - Focusing on Diabetes Clinical staff conducted group or individual video education with verbal and written material and guidebook.  Patient learns why the best way to stop diseases of our time is prevention, through food and other lifestyle changes. Medicine (such as prescription pills and surgeries) is often only a Band-Aid on the problem, not a long-term solution. Most common diseases of our time include obesity, type 2 diabetes, hypertension, heart disease, and cancer. The Pritikin Program is recommended and has been proven to help reduce, reverse, and/or prevent the damaging effects of metabolic syndrome.  Nutrition   Overview of the Pritikin Eating Plan  Clinical staff conducted group or individual video education with verbal and written material and guidebook.  Patient learns about the Pritikin Eating Plan for disease risk reduction. The Pritikin Eating Plan emphasizes a wide variety of unrefined, minimally-processed carbohydrates, like fruits, vegetables, whole grains, and legumes. Go, Caution, and Stop food choices are explained. Plant-based and lean animal proteins are emphasized. Rationale provided for low sodium intake for blood pressure control, low added sugars for blood sugar stabilization, and low added fats and oils for coronary artery disease risk reduction and weight management.  Calorie Density  Clinical staff conducted group or individual video education with verbal and written material and guidebook.  Patient learns about calorie density and how it impacts the Pritikin Eating Plan. Knowing the characteristics of the food you choose will help you decide whether those foods will lead to weight gain or weight loss, and whether you want to consume more or less of them. Weight  loss is usually a side effect of the Pritikin Eating Plan because of its focus on low calorie-dense foods.  Label Reading  Clinical staff conducted group or individual video education with verbal and written material and guidebook.  Patient learns about the Pritikin recommended label reading guidelines and corresponding recommendations regarding calorie  density, added sugars, sodium content, and whole grains.  Dining Out - Part 1  Clinical staff conducted group or individual video education with verbal and written material and guidebook.  Patient learns that restaurant meals can be sabotaging because they can be so high in calories, fat, sodium, and/or sugar. Patient learns recommended strategies on how to positively address this and avoid unhealthy pitfalls.  Facts on Fats  Clinical staff conducted group or individual video education with verbal and written material and guidebook.  Patient learns that lifestyle modifications can be just as effective, if not more so, as many medications for lowering your risk of heart disease. A Pritikin lifestyle can help to reduce your risk of inflammation and atherosclerosis (cholesterol build-up, or plaque, in the artery walls). Lifestyle interventions such as dietary choices and physical activity address the cause of atherosclerosis. A review of the types of fats and their impact on blood cholesterol levels, along with dietary recommendations to reduce fat intake is also included.  Nutrition Action Plan  Clinical staff conducted group or individual video education with verbal and written material and guidebook.  Patient learns how to incorporate Pritikin recommendations into their lifestyle. Recommendations include planning and keeping personal health goals in mind as an important part of their success.  Healthy Mind-Set    Healthy Minds, Bodies, Hearts  Clinical staff conducted group or individual video education with verbal and written material and  guidebook.  Patient learns how to identify when they are stressed. Video will discuss the impact of that stress, as well as the many benefits of stress management. Patient will also be introduced to stress management techniques. The way we think, act, and feel has an impact on our hearts.  How Our Thoughts Can Heal Our Hearts  Clinical staff conducted group or individual video education with verbal and written material and guidebook.  Patient learns that negative thoughts can cause depression and anxiety. This can result in negative lifestyle behavior and serious health problems. Cognitive behavioral therapy is an effective method to help control our thoughts in order to change and improve our emotional outlook.  Additional Videos:  Exercise    Improving Performance  Clinical staff conducted group or individual video education with verbal and written material and guidebook.  Patient learns to use a non-linear approach by alternating intensity levels and lengths of time spent exercising to help burn more calories and lose more body fat. Cardiovascular exercise helps improve heart health, metabolism, hormonal balance, blood sugar control, and recovery from fatigue. Resistance training improves strength, endurance, balance, coordination, reaction time, metabolism, and muscle mass. Flexibility exercise improves circulation, posture, and balance. Seek guidance from your physician and exercise physiologist before implementing an exercise routine and learn your capabilities and proper form for all exercise.  Introduction to Yoga  Clinical staff conducted group or individual video education with verbal and written material and guidebook.  Patient learns about yoga, a discipline of the coming together of mind, breath, and body. The benefits of yoga include improved flexibility, improved range of motion, better posture and core strength, increased lung function, weight loss, and positive self-image. Yoga's  heart health benefits include lowered blood pressure, healthier heart rate, decreased cholesterol and triglyceride levels, improved immune function, and reduced stress. Seek guidance from your physician and exercise physiologist before implementing an exercise routine and learn your capabilities and proper form for all exercise.  Medical   Aging: Enhancing Your Quality of Life  Clinical staff conducted group or individual video education with  verbal and written material and guidebook.  Patient learns key strategies and recommendations to stay in good physical health and enhance quality of life, such as prevention strategies, having an advocate, securing a Health Care Proxy and Power of Attorney, and keeping a list of medications and system for tracking them. It also discusses how to avoid risk for bone loss.  Biology of Weight Control  Clinical staff conducted group or individual video education with verbal and written material and guidebook.  Patient learns that weight gain occurs because we consume more calories than we burn (eating more, moving less). Even if your body weight is normal, you may have higher ratios of fat compared to muscle mass. Too much body fat puts you at increased risk for cardiovascular disease, heart attack, stroke, type 2 diabetes, and obesity-related cancers. In addition to exercise, following the Pritikin Eating Plan can help reduce your risk.  Decoding Lab Results  Clinical staff conducted group or individual video education with verbal and written material and guidebook.  Patient learns that lab test reflects one measurement whose values change over time and are influenced by many factors, including medication, stress, sleep, exercise, food, hydration, pre-existing medical conditions, and more. It is recommended to use the knowledge from this video to become more involved with your lab results and evaluate your numbers to speak with your doctor.   Diseases of Our Time -  Overview  Clinical staff conducted group or individual video education with verbal and written material and guidebook.  Patient learns that according to the CDC, 50% to 70% of chronic diseases (such as obesity, type 2 diabetes, elevated lipids, hypertension, and heart disease) are avoidable through lifestyle improvements including healthier food choices, listening to satiety cues, and increased physical activity.  Sleep Disorders Clinical staff conducted group or individual video education with verbal and written material and guidebook.  Patient learns how good quality and duration of sleep are important to overall health and well-being. Patient also learns about sleep disorders and how they impact health along with recommendations to address them, including discussing with a physician.  Nutrition  Dining Out - Part 2 Clinical staff conducted group or individual video education with verbal and written material and guidebook.  Patient learns how to plan ahead and communicate in order to maximize their dining experience in a healthy and nutritious manner. Included are recommended food choices based on the type of restaurant the patient is visiting.   Fueling a Banker conducted group or individual video education with verbal and written material and guidebook.  There is a strong connection between our food choices and our health. Diseases like obesity and type 2 diabetes are very prevalent and are in large-part due to lifestyle choices. The Pritikin Eating Plan provides plenty of food and hunger-curbing satisfaction. It is easy to follow, affordable, and helps reduce health risks.  Menu Workshop  Clinical staff conducted group or individual video education with verbal and written material and guidebook.  Patient learns that restaurant meals can sabotage health goals because they are often packed with calories, fat, sodium, and sugar. Recommendations include strategies to plan  ahead and to communicate with the manager, chef, or server to help order a healthier meal.  Planning Your Eating Strategy  Clinical staff conducted group or individual video education with verbal and written material and guidebook.  Patient learns about the Pritikin Eating Plan and its benefit of reducing the risk of disease. The Pritikin Eating Plan does not focus  on calories. Instead, it emphasizes high-quality, nutrient-rich foods. By knowing the characteristics of the foods, we choose, we can determine their calorie density and make informed decisions.  Targeting Your Nutrition Priorities  Clinical staff conducted group or individual video education with verbal and written material and guidebook.  Patient learns that lifestyle habits have a tremendous impact on disease risk and progression. This video provides eating and physical activity recommendations based on your personal health goals, such as reducing LDL cholesterol, losing weight, preventing or controlling type 2 diabetes, and reducing high blood pressure.  Vitamins and Minerals  Clinical staff conducted group or individual video education with verbal and written material and guidebook.  Patient learns different ways to obtain key vitamins and minerals, including through a recommended healthy diet. It is important to discuss all supplements you take with your doctor.   Healthy Mind-Set    Smoking Cessation  Clinical staff conducted group or individual video education with verbal and written material and guidebook.  Patient learns that cigarette smoking and tobacco addiction pose a serious health risk which affects millions of people. Stopping smoking will significantly reduce the risk of heart disease, lung disease, and many forms of cancer. Recommended strategies for quitting are covered, including working with your doctor to develop a successful plan.  Culinary   Becoming a Set designer conducted group or  individual video education with verbal and written material and guidebook.  Patient learns that cooking at home can be healthy, cost-effective, quick, and puts them in control. Keys to cooking healthy recipes will include looking at your recipe, assessing your equipment needs, planning ahead, making it simple, choosing cost-effective seasonal ingredients, and limiting the use of added fats, salts, and sugars.  Cooking - Breakfast and Snacks  Clinical staff conducted group or individual video education with verbal and written material and guidebook.  Patient learns how important breakfast is to satiety and nutrition through the entire day. Recommendations include key foods to eat during breakfast to help stabilize blood sugar levels and to prevent overeating at meals later in the day. Planning ahead is also a key component.  Cooking - Educational psychologist conducted group or individual video education with verbal and written material and guidebook.  Patient learns eating strategies to improve overall health, including an approach to cook more at home. Recommendations include thinking of animal protein as a side on your plate rather than center stage and focusing instead on lower calorie dense options like vegetables, fruits, whole grains, and plant-based proteins, such as beans. Making sauces in large quantities to freeze for later and leaving the skin on your vegetables are also recommended to maximize your experience.  Cooking - Healthy Salads and Dressing Clinical staff conducted group or individual video education with verbal and written material and guidebook.  Patient learns that vegetables, fruits, whole grains, and legumes are the foundations of the Pritikin Eating Plan. Recommendations include how to incorporate each of these in flavorful and healthy salads, and how to create homemade salad dressings. Proper handling of ingredients is also covered. Cooking - Soups and [employer] - Soups and Desserts Clinical staff conducted group or individual video education with verbal and written material and guidebook.  Patient learns that Pritikin soups and desserts make for easy, nutritious, and delicious snacks and meal components that are low in sodium, fat, sugar, and calorie density, while high in vitamins, minerals, and filling fiber. Recommendations include simple and healthy ideas for soups  and desserts.   Overview     The Pritikin Solution Program Overview Clinical staff conducted group or individual video education with verbal and written material and guidebook.  Patient learns that the results of the Pritikin Program have been documented in more than 100 articles published in peer-reviewed journals, and the benefits include reducing risk factors for (and, in some cases, even reversing) high cholesterol, high blood pressure, type 2 diabetes, obesity, and more! An overview of the three key pillars of the Pritikin Program will be covered: eating well, doing regular exercise, and having a healthy mind-set.  WORKSHOPS  Exercise: Exercise Basics: Building Your Action Plan Clinical staff led group instruction and group discussion with PowerPoint presentation and patient guidebook. To enhance the learning environment the use of posters, models and videos may be added. At the conclusion of this workshop, patients will comprehend the difference between physical activity and exercise, as well as the benefits of incorporating both, into their routine. Patients will understand the FITT (Frequency, Intensity, Time, and Type) principle and how to use it to build an exercise action plan. In addition, safety concerns and other considerations for exercise and cardiac rehab will be addressed by the presenter. The purpose of this lesson is to promote a comprehensive and effective weekly exercise routine in order to improve patients' overall level of fitness.   Managing Heart  Disease: Your Path to a Healthier Heart Clinical staff led group instruction and group discussion with PowerPoint presentation and patient guidebook. To enhance the learning environment the use of posters, models and videos may be added.At the conclusion of this workshop, patients will understand the anatomy and physiology of the heart. Additionally, they will understand how Pritikin's three pillars impact the risk factors, the progression, and the management of heart disease.  The purpose of this lesson is to provide a high-level overview of the heart, heart disease, and how the Pritikin lifestyle positively impacts risk factors.  Exercise Biomechanics Clinical staff led group instruction and group discussion with PowerPoint presentation and patient guidebook. To enhance the learning environment the use of posters, models and videos may be added. Patients will learn how the structural parts of their bodies function and how these functions impact their daily activities, movement, and exercise. Patients will learn how to promote a neutral spine, learn how to manage pain, and identify ways to improve their physical movement in order to promote healthy living. The purpose of this lesson is to expose patients to common physical limitations that impact physical activity. Participants will learn practical ways to adapt and manage aches and pains, and to minimize their effect on regular exercise. Patients will learn how to maintain good posture while sitting, walking, and lifting.  Balance Training and Fall Prevention  Clinical staff led group instruction and group discussion with PowerPoint presentation and patient guidebook. To enhance the learning environment the use of posters, models and videos may be added. At the conclusion of this workshop, patients will understand the importance of their sensorimotor skills (vision, proprioception, and the vestibular system) in maintaining their ability to  balance as they age. Patients will apply a variety of balancing exercises that are appropriate for their current level of function. Patients will understand the common causes for poor balance, possible solutions to these problems, and ways to modify their physical environment in order to minimize their fall risk. The purpose of this lesson is to teach patients about the importance of maintaining balance as they age and ways to minimize their risk  of falling.  WORKSHOPS   Nutrition:  Fueling a Ship broker led group instruction and group discussion with PowerPoint presentation and patient guidebook. To enhance the learning environment the use of posters, models and videos may be added. Patients will review the foundational principles of the Pritikin Eating Plan and understand what constitutes a serving size in each of the food groups. Patients will also learn Pritikin-friendly foods that are better choices when away from home and review make-ahead meal and snack options. Calorie density will be reviewed and applied to three nutrition priorities: weight maintenance, weight loss, and weight gain. The purpose of this lesson is to reinforce (in a group setting) the key concepts around what patients are recommended to eat and how to apply these guidelines when away from home by planning and selecting Pritikin-friendly options. Patients will understand how calorie density may be adjusted for different weight management goals.  Mindful Eating  Clinical staff led group instruction and group discussion with PowerPoint presentation and patient guidebook. To enhance the learning environment the use of posters, models and videos may be added. Patients will briefly review the concepts of the Pritikin Eating Plan and the importance of low-calorie dense foods. The concept of mindful eating will be introduced as well as the importance of paying attention to internal hunger signals. Triggers for non-hunger  eating and techniques for dealing with triggers will be explored. The purpose of this lesson is to provide patients with the opportunity to review the basic principles of the Pritikin Eating Plan, discuss the value of eating mindfully and how to measure internal cues of hunger and fullness using the Hunger Scale. Patients will also discuss reasons for non-hunger eating and learn strategies to use for controlling emotional eating.  Targeting Your Nutrition Priorities Clinical staff led group instruction and group discussion with PowerPoint presentation and patient guidebook. To enhance the learning environment the use of posters, models and videos may be added. Patients will learn how to determine their genetic susceptibility to disease by reviewing their family history. Patients will gain insight into the importance of diet as part of an overall healthy lifestyle in mitigating the impact of genetics and other environmental insults. The purpose of this lesson is to provide patients with the opportunity to assess their personal nutrition priorities by looking at their family history, their own health history and current risk factors. Patients will also be able to discuss ways of prioritizing and modifying the Pritikin Eating Plan for their highest risk areas  Menu  Clinical staff led group instruction and group discussion with PowerPoint presentation and patient guidebook. To enhance the learning environment the use of posters, models and videos may be added. Using menus brought in from [employer], or printed from [employer], patients will apply the Pritikin dining out guidelines that were presented in the [employer] video. Patients will also be able to practice these guidelines in a variety of provided scenarios. The purpose of this lesson is to provide patients with the opportunity to practice hands-on learning of the Pritikin Dining Out guidelines with actual menus and practice  scenarios.  Label Reading Clinical staff led group instruction and group discussion with PowerPoint presentation and patient guidebook. To enhance the learning environment the use of posters, models and videos may be added. Patients will review and discuss the Pritikin label reading guidelines presented in Pritikin's Label Reading Educational series video. Using fool labels brought in from local grocery stores and markets, patients will apply the  label reading guidelines and determine if the packaged food meet the Pritikin guidelines. The purpose of this lesson is to provide patients with the opportunity to review, discuss, and practice hands-on learning of the Pritikin Label Reading guidelines with actual packaged food labels. Cooking School  Pritikin's [employer] are designed to teach patients ways to prepare quick, simple, and affordable recipes at home. The importance of nutrition's role in chronic disease risk reduction is reflected in its emphasis in the overall Pritikin program. By learning how to prepare essential core Pritikin Eating Plan recipes, patients will increase control over what they eat; be able to customize the flavor of foods without the use of added salt, sugar, or fat; and improve the quality of the food they consume. By learning a set of core recipes which are easily assembled, quickly prepared, and affordable, patients are more likely to prepare more healthy foods at home. These workshops focus on convenient breakfasts, simple entres, side dishes, and desserts which can be prepared with minimal effort and are consistent with nutrition recommendations for cardiovascular risk reduction. Cooking [employer] are taught by a Armed forces logistics/support/administrative officer (RD) who has been trained by the [employer]. The chef or RD has a clear understanding of the importance of minimizing - if not completely eliminating - added fat, sugar, and sodium in recipes. Throughout  the series of Cooking School Workshop sessions, patients will learn about healthy ingredients and efficient methods of cooking to build confidence in their capability to prepare    Cooking School weekly topics:  Adding Flavor- Sodium-Free  Fast and Healthy Breakfasts  Powerhouse Plant-Based Proteins  Satisfying Salads and Dressings  Simple Sides and Sauces  International Cuisine-Spotlight on the [employer] Zones  Delicious Desserts  Savory Soups  [employer] - Meals in a Astronomer Appetizers and Snacks  Comforting Weekend Breakfasts  One-Pot Wonders   Fast Evening Meals  Landscape architect Your Pritikin Plate  WORKSHOPS   Healthy Mindset (Psychosocial):  Focused Goals, Sustainable Changes Clinical staff led group instruction and group discussion with PowerPoint presentation and patient guidebook. To enhance the learning environment the use of posters, models and videos may be added. Patients will be able to apply effective goal setting strategies to establish at least one personal goal, and then take consistent, meaningful action toward that goal. They will learn to identify common barriers to achieving personal goals and develop strategies to overcome them. Patients will also gain an understanding of how our mind-set can impact our ability to achieve goals and the importance of cultivating a positive and growth-oriented mind-set. The purpose of this lesson is to provide patients with a deeper understanding of how to set and achieve personal goals, as well as the tools and strategies needed to overcome common obstacles which may arise along the way.  From Head to Heart: The Power of a Healthy Outlook  Clinical staff led group instruction and group discussion with PowerPoint presentation and patient guidebook. To enhance the learning environment the use of posters, models and videos may be added. Patients will be able to recognize and describe the impact of emotions and  mood on physical health. They will discover the importance of self-care and explore self-care practices which may work for them. Patients will also learn how to utilize the 4 C's to cultivate a healthier outlook and better manage stress and challenges. The purpose of this lesson is to demonstrate to patients how a healthy outlook is an essential  part of maintaining good health, especially as they continue their cardiac rehab journey.  Healthy Sleep for a Healthy Heart Clinical staff led group instruction and group discussion with PowerPoint presentation and patient guidebook. To enhance the learning environment the use of posters, models and videos may be added. At the conclusion of this workshop, patients will be able to demonstrate knowledge of the importance of sleep to overall health, well-being, and quality of life. They will understand the symptoms of, and treatments for, common sleep disorders. Patients will also be able to identify daytime and nighttime behaviors which impact sleep, and they will be able to apply these tools to help manage sleep-related challenges. The purpose of this lesson is to provide patients with a general overview of sleep and outline the importance of quality sleep. Patients will learn about a few of the most common sleep disorders. Patients will also be introduced to the concept of "sleep hygiene," and discover ways to self-manage certain sleeping problems through simple daily behavior changes. Finally, the workshop will motivate patients by clarifying the links between quality sleep and their goals of heart-healthy living.   Recognizing and Reducing Stress Clinical staff led group instruction and group discussion with PowerPoint presentation and patient guidebook. To enhance the learning environment the use of posters, models and videos may be added. At the conclusion of this workshop, patients will be able to understand the types of stress reactions, differentiate between  acute and chronic stress, and recognize the impact that chronic stress has on their health. They will also be able to apply different coping mechanisms, such as reframing negative self-talk. Patients will have the opportunity to practice a variety of stress management techniques, such as deep abdominal breathing, progressive muscle relaxation, and/or guided imagery.  The purpose of this lesson is to educate patients on the role of stress in their lives and to provide healthy techniques for coping with it.  Learning Barriers/Preferences:  Learning Barriers/Preferences - 08/11/23 1216       Learning Barriers/Preferences   Learning Barriers Sight    Learning Preferences Audio;Computer/Internet;Pictoral;Verbal Instruction;Video             Education Topics:  Knowledge Questionnaire Score:  Knowledge Questionnaire Score - 08/11/23 1259       Knowledge Questionnaire Score   Pre Score 21/24             Core Components/Risk Factors/Patient Goals at Admission:  Personal Goals and Risk Factors at Admission - 08/11/23 1300       Core Components/Risk Factors/Patient Goals on Admission    Weight Management Yes;Obesity;Weight Loss    Intervention Weight Management: Develop a combined nutrition and exercise program designed to reach desired caloric intake, while maintaining appropriate intake of nutrient and fiber, sodium and fats, and appropriate energy expenditure required for the weight goal.;Weight Management: Provide education and appropriate resources to help participant work on and attain dietary goals.;Weight Management/Obesity: Establish reasonable short term and long term weight goals.;Obesity: Provide education and appropriate resources to help participant work on and attain dietary goals.    Admit Weight 255 lb 4.7 oz (115.8 kg)    Expected Outcomes Short Term: Continue to assess and modify interventions until short term weight is achieved;Long Term: Adherence to nutrition and  physical activity/exercise program aimed toward attainment of established weight goal;Weight Loss: Understanding of general recommendations for a balanced deficit meal plan, which promotes 1-2 lb weight loss per week and includes a negative energy balance of [phone] kcal/d;Understanding recommendations for meals to  include 15-35% energy as protein, 25-35% energy from fat, 35-60% energy from carbohydrates, less than 200mg  of dietary cholesterol, 20-35 gm of total fiber daily;Understanding of distribution of calorie intake throughout the day with the consumption of 4-5 meals/snacks    Improve shortness of breath with ADL's Yes    Intervention Provide education, individualized exercise plan and daily activity instruction to help decrease symptoms of SOB with activities of daily living.    Expected Outcomes Short Term: Improve cardiorespiratory fitness to achieve a reduction of symptoms when performing ADLs;Long Term: Be able to perform more ADLs without symptoms or delay the onset of symptoms    Intervention Provide education about signs/symptoms and action to take for hypo/hyperglycemia.;Provide education about proper nutrition, including hydration, and aerobic/resistive exercise prescription along with prescribed medications to achieve blood glucose in normal ranges: Fasting glucose 65-99 mg/dL    Expected Outcomes Long Term: Attainment of HbA1C < 7%.;Short Term: Participant verbalizes understanding of the signs/symptoms and immediate care of hyper/hypoglycemia, proper foot care and importance of medication, aerobic/resistive exercise and nutrition plan for blood glucose control.    Intervention Provide a combined exercise and nutrition program that is supplemented with education, support and counseling about heart failure. Directed toward relieving symptoms such as shortness of breath, decreased exercise tolerance, and extremity edema.    Expected Outcomes Improve functional capacity of life;Short term:  Attendance in program 2-3 days a week with increased exercise capacity. Reported lower sodium intake. Reported increased fruit and vegetable intake. Reports medication compliance.;Short term: Daily weights obtained and reported for increase. Utilizing diuretic protocols set by physician.;Long term: Adoption of self-care skills and reduction of barriers for early signs and symptoms recognition and intervention leading to self-care maintenance.    Hypertension Yes    Intervention Provide education on lifestyle modifcations including regular physical activity/exercise, weight management, moderate sodium restriction and increased consumption of fresh fruit, vegetables, and low fat dairy, alcohol moderation, and smoking cessation.;Monitor prescription use compliance.    Expected Outcomes Short Term: Continued assessment and intervention until BP is < 140/45mm HG in hypertensive participants. < 130/73mm HG in hypertensive participants with diabetes, heart failure or chronic kidney disease.;Long Term: Maintenance of blood pressure at goal levels.    Lipids Yes    Intervention Provide education and support for participant on nutrition & aerobic/resistive exercise along with prescribed medications to achieve LDL 70mg , HDL >40mg .    Expected Outcomes Short Term: Participant states understanding of desired cholesterol values and is compliant with medications prescribed. Participant is following exercise prescription and nutrition guidelines.;Long Term: Cholesterol controlled with medications as prescribed, with individualized exercise RX and with personalized nutrition plan. Value goals: LDL < 70mg , HDL > 40 mg.    Stress Yes    Intervention Offer individual and/or small group education and counseling on adjustment to heart disease, stress management and health-related lifestyle change. Teach and support self-help strategies.    Expected Outcomes Short Term: Participant demonstrates changes in health-related  behavior, relaxation and other stress management skills, ability to obtain effective social support, and compliance with psychotropic medications if prescribed.;Long Term: Emotional wellbeing is indicated by absence of clinically significant psychosocial distress or social isolation.             Core Components/Risk Factors/Patient Goals Review:    Core Components/Risk Factors/Patient Goals at Discharge (Final Review):    ITP Comments:  ITP Comments     Row Name 07/02/23 1332           ITP Comments Dr. Gloris Manchester  [facility] [employer] geologist. Introduction to [facility] education program/ intensive cardiac rehab. Initial education packet reviewed with patient.                Comments: Participant attended orientation for the cardiac rehabilitation program on  08/11/2023  to perform initial intake and exercise walk test. Patient introduced to the [facility] education and orientation packet was reviewed. Completed 6-minute walk test using his rollator, measurements, initial ITP, and exercise prescription. Vital signs stable. Telemetry-normal sinus rhythm, occasional PAC's asymptomatic.   Service time was from 9:18 to 11:45.

## 2023-08-11 NOTE — Progress Notes (Signed)
Cardiac Rehab Medication Review   Does the patient  feel that his/her medications are working for him/her?  yes  Has the patient been experiencing any side effects to the medications prescribed?  no  Does the patient measure his/her own blood pressure or blood glucose at home?  yes   Does the patient have any problems obtaining medications due to transportation or finances?   no  Understanding of regimen: fair Understanding of indications: fair Potential of compliance: good    Comments: Pt accompanied by his spouse, she keeps track of the patients medications. Spouse has good understanding of the medications. Pt checks blood sugar at home 2x/day.     Lorin Picket 08/11/2023 1:04 PM

## 2023-08-12 ENCOUNTER — Ambulatory Visit (HOSPITAL_COMMUNITY): Payer: Medicare Other

## 2023-08-14 ENCOUNTER — Ambulatory Visit (HOSPITAL_COMMUNITY): Payer: Medicare Other

## 2023-08-19 ENCOUNTER — Encounter (HOSPITAL_COMMUNITY)
Admission: RE | Admit: 2023-08-19 | Discharge: 2023-08-19 | Disposition: A | Discharge status: Home / Self Care | Payer: Medicare Other | Source: Ambulatory Visit | Attending: Cardiology | Admitting: Cardiology

## 2023-08-19 ENCOUNTER — Ambulatory Visit (HOSPITAL_COMMUNITY): Payer: Medicare Other

## 2023-08-19 DIAGNOSIS — I214 Non-ST elevation (NSTEMI) myocardial infarction: Secondary | ICD-10-CM

## 2023-08-19 DIAGNOSIS — I10 Essential (primary) hypertension: Secondary | ICD-10-CM | POA: Diagnosis not present

## 2023-08-19 DIAGNOSIS — I252 Old myocardial infarction: Secondary | ICD-10-CM | POA: Insufficient documentation

## 2023-08-19 DIAGNOSIS — Z79899 Other long term (current) drug therapy: Secondary | ICD-10-CM | POA: Diagnosis not present

## 2023-08-19 DIAGNOSIS — R079 Chest pain, unspecified: Secondary | ICD-10-CM | POA: Insufficient documentation

## 2023-08-19 DIAGNOSIS — Z48812 Encounter for surgical aftercare following surgery on the circulatory system: Secondary | ICD-10-CM | POA: Diagnosis not present

## 2023-08-19 LAB — GLUCOSE, CAPILLARY
Glucose-Capillary: 184 mg/dL — ABNORMAL HIGH (ref 70–99)
Glucose-Capillary: 143 mg/dL — ABNORMAL HIGH (ref 70–99)

## 2023-08-19 NOTE — Progress Notes (Signed)
Daily Session Note  Patient Details  Name: [name] MRN: [id number] Date of Birth: 1946-08-26 Referring Provider:   Flowsheet Row INTENSIVE CARDIAC REHAB ORIENT from 08/11/2023 in [facility]  Referring Provider Jodelle Red, MD       Encounter Date: 08/19/2023  Check In:  Session Check In - 08/19/23 1512       Check-In   Supervising physician immediately available to respond to emergencies [facility] MD immediately available    Physician(s) Bernadene Person, NP    Location [facility]    Staff Present Lorin Picket, MS, ACSM-CEP, CCRP, Exercise Physiologist;Raphel Stickles, RN, Zachery Conch, MS, ACSM-CEP, Exercise Physiologist;Jetta Walker BS, ACSM-CEP, Exercise Physiologist    Virtual Visit No    Medication changes reported     No    Fall or balance concerns reported    No    Tobacco Cessation No Change    Warm-up and Cool-down Performed as group-led instruction    Resistance Training Performed No    VAD Patient? No    PAD/SET Patient? No      Pain Assessment   Currently in Pain? No/denies    Pain Score 0-No pain    Multiple Pain Sites No             Capillary Blood Glucose: Results for orders placed or performed during the hospital encounter of 08/19/23 (from the past 24 hour(s))  Glucose, capillary     Status: Abnormal   Collection Time: 08/19/23  2:55 PM  Result Value Ref Range   Glucose-Capillary 184 (H) 70 - 99 mg/dL  Glucose, capillary     Status: Abnormal   Collection Time: 08/19/23  3:37 PM  Result Value Ref Range   Glucose-Capillary 143 (H) 70 - 99 mg/dL      Social History   Tobacco Use  Smoking Status Former   Current packs/day: 0.00   Types: Cigarettes   Quit date: 12/14/1975   Years since quitting: 47.7  Smokeless Tobacco Never    Goals Met:  Exercise tolerated well No report of concerns or symptoms today  Goals Unmet:  Not  Applicable  Comments: Pt started cardiac rehab today.  Pt tolerated light exercise without difficulty. VSS, telemetry-Sinus Rhythm, asymptomatic.  Medication list reconciled. Pt denies barriers to medicaiton compliance.  Al uses a rollator for stability. PSYCHOSOCIAL ASSESSMENT:  PHQ-6. Pt exhibits positive coping skills, hopeful outlook with supportive family. No psychosocial needs identified at this time, no psychosocial interventions necessary.    Pt enjoys listening to audio books watching TV and spending time with his Grandson.   Pt oriented to exercise equipment and routine.   Understanding verbalized.Thayer Headings RN BSN    Dr. Armanda Magic is Medical Director for Cardiac Rehab at [facility].

## 2023-08-20 NOTE — Progress Notes (Addendum)
QUALITY OF LIFE SCORE REVIEW  Pt completed Quality of Life survey as a participant in Cardiac Rehab.  Scores 21.0 or below are considered low.  Pt score very low in the health and functioning area of his quality of life review. Overall 18.96, Health and Function 10.9, socioeconomic 25.21, physiological and spiritual 22.07, family 30.0. Patient quality of life slightly altered by physical constraints which limits ability to perform as prior to recent cardiac illness. [name] admits to being dissatisfied with his health due to his recent MI and deconditioned state of health. [name] admits to being inactive and hopes that participating in cardiac rehab will increase his endurance. [name] admits to overeating, admits to like eating ice cream every day. [name] denies being depressed. [name] has good family support. Offered emotional support and reassurance.  Will continue to monitor and intervene as necessary.Will forward  quality of life score review to [name]'s primary care provider, Dr [name] Swazil[place] as requested.Gladstone Lighter, RN,BSN 08/20/2023 2:03 PM

## 2023-08-21 ENCOUNTER — Ambulatory Visit (HOSPITAL_COMMUNITY): Payer: Medicare Other

## 2023-08-21 ENCOUNTER — Encounter (HOSPITAL_COMMUNITY)
Admission: RE | Admit: 2023-08-21 | Discharge: 2023-08-21 | Disposition: A | Discharge status: Home / Self Care | Payer: Medicare Other | Source: Ambulatory Visit | Attending: Cardiology | Admitting: Cardiology

## 2023-08-21 ENCOUNTER — Telehealth: Payer: Self-pay | Admitting: Family Medicine

## 2023-08-21 DIAGNOSIS — I214 Non-ST elevation (NSTEMI) myocardial infarction: Secondary | ICD-10-CM

## 2023-08-21 DIAGNOSIS — E1122 Type 2 diabetes mellitus with diabetic chronic kidney disease: Secondary | ICD-10-CM

## 2023-08-21 MED ORDER — INSULIN GLARGINE 100 UNIT/ML ~~LOC~~ SOLN: 50.0000 [IU] | Freq: Every day | SUBCUTANEOUS | 3 refills | Status: DC

## 2023-08-21 NOTE — Telephone Encounter (Signed)
Prescription Request  08/21/2023  LOV: 06/12/2023  What is the name of the medication or equipment? insulin glargine insulin glargine (LANTUS) 100 UNIT/ML injection  Have you contacted your pharmacy to request a refill? No  *Spouse states they normally get it thru the [place], but waited too long so it's too late, and this is an emergency/courtesy fill, as Pt is completely out.   Please advise.   Which pharmacy would you like this sent to?    [id number] G[place] Otto, [place] -  [address] AT [facility] [address] [place] [address] Phone: [phone] Fax: [phone]    Patient notified that their request is being sent to the clinical staff for review and that they should receive a response within 2 business days.   Please advise at Mobile [phone] (mobile)

## 2023-08-21 NOTE — Telephone Encounter (Signed)
Rx sent in

## 2023-08-24 ENCOUNTER — Encounter (HOSPITAL_COMMUNITY)
Admission: RE | Admit: 2023-08-24 | Discharge: 2023-08-24 | Disposition: A | Discharge status: Home / Self Care | Payer: Medicare Other | Source: Ambulatory Visit | Attending: Cardiology | Admitting: Cardiology

## 2023-08-24 ENCOUNTER — Ambulatory Visit (HOSPITAL_COMMUNITY): Payer: Medicare Other

## 2023-08-24 DIAGNOSIS — I252 Old myocardial infarction: Secondary | ICD-10-CM | POA: Diagnosis not present

## 2023-08-24 DIAGNOSIS — Z79899 Other long term (current) drug therapy: Secondary | ICD-10-CM | POA: Diagnosis not present

## 2023-08-24 DIAGNOSIS — I10 Essential (primary) hypertension: Secondary | ICD-10-CM | POA: Diagnosis not present

## 2023-08-24 DIAGNOSIS — Z48812 Encounter for surgical aftercare following surgery on the circulatory system: Secondary | ICD-10-CM | POA: Diagnosis not present

## 2023-08-24 DIAGNOSIS — R079 Chest pain, unspecified: Secondary | ICD-10-CM | POA: Diagnosis not present

## 2023-08-24 DIAGNOSIS — I214 Non-ST elevation (NSTEMI) myocardial infarction: Secondary | ICD-10-CM

## 2023-08-24 LAB — GLUCOSE, CAPILLARY
Glucose-Capillary: 148 mg/dL — ABNORMAL HIGH (ref 70–99)
Glucose-Capillary: 111 mg/dL — ABNORMAL HIGH (ref 70–99)

## 2023-08-26 ENCOUNTER — Encounter (HOSPITAL_COMMUNITY)
Admission: RE | Admit: 2023-08-26 | Discharge: 2023-08-26 | Disposition: A | Discharge status: Home / Self Care | Payer: Medicare Other | Source: Ambulatory Visit | Attending: Cardiology | Admitting: Cardiology

## 2023-08-26 ENCOUNTER — Ambulatory Visit (INDEPENDENT_AMBULATORY_CARE_PROVIDER_SITE_OTHER): Payer: Medicare Other | Admitting: Cardiology

## 2023-08-26 ENCOUNTER — Ambulatory Visit (HOSPITAL_COMMUNITY): Payer: Medicare Other

## 2023-08-26 ENCOUNTER — Encounter (HOSPITAL_BASED_OUTPATIENT_CLINIC_OR_DEPARTMENT_OTHER): Payer: Self-pay | Admitting: Cardiology

## 2023-08-26 VITALS — BP 137/77 | HR 60 | Ht 70.75 in | Wt 259.9 lb

## 2023-08-26 DIAGNOSIS — Z79899 Other long term (current) drug therapy: Secondary | ICD-10-CM | POA: Diagnosis not present

## 2023-08-26 DIAGNOSIS — R079 Chest pain, unspecified: Secondary | ICD-10-CM | POA: Diagnosis not present

## 2023-08-26 DIAGNOSIS — Z951 Presence of aortocoronary bypass graft: Secondary | ICD-10-CM

## 2023-08-26 DIAGNOSIS — I2510 Atherosclerotic heart disease of native coronary artery without angina pectoris: Secondary | ICD-10-CM

## 2023-08-26 DIAGNOSIS — N1831 Chronic kidney disease, stage 3a: Secondary | ICD-10-CM | POA: Diagnosis not present

## 2023-08-26 DIAGNOSIS — Z7901 Long term (current) use of anticoagulants: Secondary | ICD-10-CM

## 2023-08-26 DIAGNOSIS — D6869 Other thrombophilia: Secondary | ICD-10-CM | POA: Diagnosis not present

## 2023-08-26 DIAGNOSIS — I10 Essential (primary) hypertension: Secondary | ICD-10-CM

## 2023-08-26 DIAGNOSIS — E1159 Type 2 diabetes mellitus with other circulatory complications: Secondary | ICD-10-CM | POA: Diagnosis not present

## 2023-08-26 DIAGNOSIS — Z48812 Encounter for surgical aftercare following surgery on the circulatory system: Secondary | ICD-10-CM | POA: Diagnosis not present

## 2023-08-26 DIAGNOSIS — I480 Paroxysmal atrial fibrillation: Secondary | ICD-10-CM | POA: Diagnosis not present

## 2023-08-26 DIAGNOSIS — I252 Old myocardial infarction: Secondary | ICD-10-CM

## 2023-08-26 DIAGNOSIS — I214 Non-ST elevation (NSTEMI) myocardial infarction: Secondary | ICD-10-CM

## 2023-08-26 NOTE — Patient Instructions (Addendum)
Medication Instructions:  Your physician recommends that you continue on your current medications as directed. Please refer to the Current Medication list given to you today.  *If you need a refill on your cardiac medications before your next appointment, please call your pharmacy*  Lab Work: NONE  Testing/Procedures: NONE  Follow-Up: 01/06/2023 11:20 AM WITH DR Cristal Deer

## 2023-08-26 NOTE — Progress Notes (Signed)
Cardiology Office Note:  .    Date:  08/26/2023  ID:  [name], DOB 03/27/1946, MRN [id number] PCP: [place], [name] G, MD   HeartCare Providers Cardiologist:  Jodelle Red, MD     History of Present Illness: .    [name] is a 77 y.o. male with a hx of atrial fibrillation, myocardial infarction, coronary artery disease, hypertension, hyperlipidemia, dyspnea, sleep apnea, chronic kidney disease, diabetes mellitus, cancer, who is seen for follow up today.    At his visit 05/2023, he was seen urgently for shortness of breath. He was extremely tachypneic on arrival, RR in the 30s. O2 sat 97. Complained of bilateral arm pressure on and off for a few weeks. Had taken NG multiple times with some relief but not complete resolution. Had 10 lb weight gain over one month with mild increase in LE edema. I had him take two of his nitroglycerin while in the room with me. This relieved some of his discomfort but did not completely stop it. He was sent to the ER. On arrival his EKG showed no acute ischemic changes. Troponins were elevated and peaked at 201. BNP was 347. Echo showed LVEF 50-55% with global hypokinesis, vascular congestion. He was started on Jardiance and received diuresis. Valsartan and hydralazine were discontinued given AKI and normotension. Subsequently underwent cardiac catheterization 06/09/2023 for NSTEMI, showing patent LIMA to LAD vein graft to PDA. Vein graft to ramus occluded. He had severe disease of the ostial LAD and had an occluded proximal left circumflex and known occluded ostial right coronary artery, with plans to medically manage. He was also discharged on 40 mg Lasix.  He followed up with Edd Fabian, NP 06/2023. Noted some fatigue. Blood pressures were well controlled, heart rates in the 40s-50s. His home weight was stable.   Today, he is accompanied by his wife. Overall he is feeling better at this time. A week ago he started cardiac  rehab, has been doing well. No issues with chest pain. His weight remains steady at home.   In the office his blood pressure is 162/82. While in cardiac rehab it is always closer to 110s-120s systolic. This morning his blood pressure was 137/67. However, he has also seen 150s-160s/80s at home. His home BP cuff was validated today, noted to be 6 points higher than our clinic reading.   He expressed interest with increasing his Lasix to BID. Discussed this today. He has noticed decreased urine production with Lasix. However, his wife states that he may not have been taking this for a few days due to some confusion during a refill. Of note, he has also been struggling with a UTI recently associated with hematuria and has completed antibiotic courses twice. For about a week now he has not seen any recurring hematuria and urgency has improved.    He denies any palpitations, chest pain, shortness of breath, peripheral edema, lightheadedness, headaches, syncope, orthopnea, or PND.  ROS:  Please see the history of present illness. ROS otherwise negative except as noted.   Studies Reviewed: Marland Kitchen         Left Heart Cath  06/09/2023:   Prox Graft to Mid Graft lesion is 40% stenosed.   Ost RCA to Prox RCA lesion is 100% stenosed.   Origin to Prox Graft lesion is 100% stenosed.   Ramus lesion is 80% stenosed.   Dist LM lesion is 80% stenosed.   Ost LAD lesion is 100% stenosed.   Prox Cx to Mid  Cx lesion is 100% stenosed.   1.  Patent LIMA to LAD and vein graft to PDA; the vein graft to ramus is now occluded. 2.  Severe native vessel disease with occluded ostial LAD, occluded proximal left circumflex and known occluded ostial right coronary artery (not imaged on this study). 3.  Elevated LVEDP of 28 to 30 mmHg across the respiratory cycle.   Recommendation: Medical therapy.  Echo  06/09/2023:  1. Left ventricular ejection fraction, by estimation, is 50 to 55%. The  left ventricle has low normal function.  The left ventricle demonstrates  global hypokinesis. There is mild concentric left ventricular hypertrophy  of the inferior segment. Left  ventricular diastolic function could not be evaluated.   2. Right ventricular systolic function was not well visualized. The right  ventricular size is not well visualized. Tricuspid regurgitation signal is  inadequate for assessing PA pressure.   3. The mitral valve is normal in structure. No evidence of mitral valve  regurgitation. No evidence of mitral stenosis.   4. The aortic valve was not well visualized. Aortic valve regurgitation  is not visualized. No aortic stenosis is present.   5. The inferior vena cava is dilated in size with <50% respiratory  variability, suggesting right atrial pressure of 15 mmHg.   Physical Exam:    VS:  BP 137/77 Comment: home  Pulse 60   Ht 5' 10.75" (1.797 m)   Wt 259 lb 14.4 oz (117.9 kg)   SpO2 95%   BMI 36.51 kg/m    Wt Readings from Last 3 Encounters:  08/26/23 259 lb 14.4 oz (117.9 kg)  08/11/23 255 lb 4.7 oz (115.8 kg)  08/07/23 255 lb 1.6 oz (115.7 kg)    GEN: Well nourished, well developed in no acute distress HEENT: Normal, moist mucous membranes NECK: No JVD CARDIAC: regular rhythm, normal S1 and S2, no rubs or gallops. No murmur. VASCULAR: Radial and DP pulses 2+ bilaterally. No carotid bruits RESPIRATORY:  Clear to auscultation without rales, wheezing or rhonchi  ABDOMEN: Soft, non-tender, non-distended MUSCULOSKELETAL:  Ambulates independently SKIN: Warm and dry, mild and firm right LE edema  NEUROLOGIC:  Alert and oriented x 3. No focal neuro deficits noted. PSYCHIATRIC:  Normal affect   ASSESSMENT AND PLAN: .    Recent NSTEMI CAD s/p prior CABG -sent to ER from office at my last visit, cath showed SVG to ramus was now occluded. Recommended medical management -has felt much better since cath, now in cardiac rehab -EF 50-55% -continue aspirin, atorvastatin. Not on DAPT as he is on  apixaban -he is on Jardiance currently, which has heart and kidney protection. However, he has been having frequent UTIs' He reports that Dr. Ronalee Belts has discussed stopping this. Agree that if UTI's are frequent or difficult to treat, stopping SGLT2i is reasonable. If we lose the heart protection from the SGLT2i, I'd like to use GLP1RA in exchange. No history of MEN/medullary thyroid cancer. He has prior history of pancreatic cancer. I researched and cannot find a clear contraindication with a prior history of pancreatic cancer. Both stopping SGLT2i and starting GLP1RA may affect his need for insulin. I will reach out to Dr. Ronalee Belts and Dr. [place] to discuss the best options.  Hypertension Chronic kidney disease stage 3a -see SGLT2i as above -continue amlodipine -lasix daily, appears euvolemic -BP cuff at home is slightly higher than office cuff  Paroxysmal atrial fibrillation -CHA2DS2/VAS Stroke Risk Points= 6  -continue apixaban  Type II diabetes, on insulin -see  SGLT2i vs GLP as above  Dispo: Follow-up in late January, or sooner as needed.  High risk medical conditions with high complexity medical decision making  I,Mathew Stumpf,acting as a scribe for Jodelle Red, MD.,have documented all relevant documentation on the behalf of Jodelle Red, MD,as directed by  Jodelle Red, MD while in the presence of Jodelle Red, MD.  I, Jodelle Red, MD, have reviewed all documentation for this visit. The documentation on 08/26/23 for the exam, diagnosis, procedures, and orders are all accurate and complete.   Signed, Jodelle Red, MD

## 2023-08-28 ENCOUNTER — Ambulatory Visit (HOSPITAL_COMMUNITY): Payer: Medicare Other

## 2023-08-28 ENCOUNTER — Encounter (HOSPITAL_COMMUNITY)
Admission: RE | Admit: 2023-08-28 | Discharge: 2023-08-28 | Disposition: A | Discharge status: Home / Self Care | Payer: Medicare Other | Source: Ambulatory Visit | Attending: Cardiology

## 2023-08-28 DIAGNOSIS — R079 Chest pain, unspecified: Secondary | ICD-10-CM | POA: Diagnosis not present

## 2023-08-28 DIAGNOSIS — I252 Old myocardial infarction: Secondary | ICD-10-CM | POA: Diagnosis not present

## 2023-08-28 DIAGNOSIS — Z48812 Encounter for surgical aftercare following surgery on the circulatory system: Secondary | ICD-10-CM | POA: Diagnosis not present

## 2023-08-28 DIAGNOSIS — Z79899 Other long term (current) drug therapy: Secondary | ICD-10-CM | POA: Diagnosis not present

## 2023-08-28 DIAGNOSIS — I214 Non-ST elevation (NSTEMI) myocardial infarction: Secondary | ICD-10-CM

## 2023-08-28 DIAGNOSIS — I10 Essential (primary) hypertension: Secondary | ICD-10-CM | POA: Diagnosis not present

## 2023-08-31 ENCOUNTER — Ambulatory Visit (HOSPITAL_COMMUNITY): Payer: Medicare Other

## 2023-08-31 ENCOUNTER — Encounter (HOSPITAL_COMMUNITY)
Admission: RE | Admit: 2023-08-31 | Discharge: 2023-08-31 | Disposition: A | Discharge status: Home / Self Care | Payer: Medicare Other | Source: Ambulatory Visit | Attending: Cardiology

## 2023-08-31 DIAGNOSIS — I252 Old myocardial infarction: Secondary | ICD-10-CM | POA: Diagnosis not present

## 2023-08-31 DIAGNOSIS — Z79899 Other long term (current) drug therapy: Secondary | ICD-10-CM | POA: Diagnosis not present

## 2023-08-31 DIAGNOSIS — I10 Essential (primary) hypertension: Secondary | ICD-10-CM | POA: Diagnosis not present

## 2023-08-31 DIAGNOSIS — Z48812 Encounter for surgical aftercare following surgery on the circulatory system: Secondary | ICD-10-CM | POA: Diagnosis not present

## 2023-08-31 DIAGNOSIS — R079 Chest pain, unspecified: Secondary | ICD-10-CM | POA: Diagnosis not present

## 2023-08-31 DIAGNOSIS — I214 Non-ST elevation (NSTEMI) myocardial infarction: Secondary | ICD-10-CM

## 2023-09-01 ENCOUNTER — Ambulatory Visit (INDEPENDENT_AMBULATORY_CARE_PROVIDER_SITE_OTHER): Payer: Medicare Other | Admitting: Pulmonary Disease

## 2023-09-01 ENCOUNTER — Encounter (HOSPITAL_BASED_OUTPATIENT_CLINIC_OR_DEPARTMENT_OTHER): Payer: Self-pay | Admitting: Pulmonary Disease

## 2023-09-01 VITALS — BP 110/70 | HR 50 | Ht 70.75 in | Wt 259.0 lb

## 2023-09-01 DIAGNOSIS — G4733 Obstructive sleep apnea (adult) (pediatric): Secondary | ICD-10-CM

## 2023-09-01 NOTE — Patient Instructions (Signed)
Obtain a copy of your sleep study from the [place].  X send an order to the [place] [facility] to change to auto CPAP 10 to 15 cm Rx for nasal cradle mask

## 2023-09-01 NOTE — Assessment & Plan Note (Signed)
CPAP download was reviewed which shows average pressure of 12 cm and maximum pressure of 13 cm on auto CPAP 6 to 20 cm with good control of events over the last 2 months and mild leak.  He is very compliant mostly but when his foot starts hurting he gets in a recliner and those nights show less usage of CPAP. Average usage is about 7 hours per night. We will change him to auto CPAP settings 10 to 15 cm.  Overall CPAP is certainly helped improve his daytime somnolence and fatigue  Weight loss encouraged, compliance with goal of at least 4-6 hrs every night is the expectation. Advised against medications with sedative side effects Cautioned against driving when sleepy - understanding that sleepiness will vary on a day to day basis

## 2023-09-01 NOTE — Progress Notes (Signed)
Subjective:    Patient ID: [name], male    DOB: 1946/06/01, 77 y.o.   MRN: [id number]  HPI  77 year old veteran presents to establish care for OSA. He had a sleep study done at [facility] 20 years ago and has been maintained on CPAP since then.  He reports good compliance with his machine.  He has a nasal mask.  He has noted that water lasts less than his humidifier lately.  He obtained a new AutoSet 10 machine about 2 years ago, set between 6 to 20 cm  PMH  CAD s/p CABG 2014 -cardiac catheterization 06/09/2023 for NSTEMI, showing patent LIMA to LAD vein graft to PDA. Vein graft to ramus occluded.   atrial fibrillation, hypertension, hyperlipidemia,  chronic kidney disease, diabetes mellitus,  Pancreatic cancer   Epworth Sleepiness Scale is 3.  Bedtime is between 9 and 11 PM, sleep latency is 15 minutes, he sleeps with his head of bed elevated on his back or left side.  If his foot starts hurting or gets cold, he will get out of bed and into a recliner.  He reports 1 nocturnal awakening and is out of bed by 7 AM feeling rested without dryness of mouth or headache. There is no history suggestive of cataplexy, sleep paralysis or parasomnias  He is enrolled in cardiac rehab and this seems to be helping  Past Medical History:  Diagnosis Date   A-fib (HCC) 03/04/2021   Anemia    Arthritis    Cancer (HCC)    prostate   Chronic kidney disease    blood in urine    Constipation    Coronary artery disease    Depression    Diabetes mellitus without complication (HCC)    Difficult intubation    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 it was hard to get the tube down his throat   Dyspnea    Family history of breast cancer    Fatty liver    Fatty liver    Frequent headaches    GERD (gastroesophageal reflux disease)    History of chicken pox    History of fainting spells of unknown cause    History of prostate cancer    Hyperlipidemia    Hypertension    Myocardial infarction [facility])     Peripheral neuropathy    Pneumonia    Prostate cancer (HCC)    PTSD (post-traumatic stress disorder)    Sleep apnea    uses Cpap   Past Surgical History:  Procedure Laterality Date   APPENDECTOMY     BIOPSY  01/15/2021   Procedure: BIOPSY;  Surgeon: Lemar Lofty., MD;  Location: [facility] ENDOSCOPY;  Service: Gastroenterology;;   CARDIAC CATHETERIZATION  06/26/2017   CARDIAC SURGERY     Triple Bypass   CHOLECYSTECTOMY  2010   COLONOSCOPY     CORONARY ARTERY BYPASS GRAFT  2004   ESOPHAGOGASTRODUODENOSCOPY (EGD) WITH PROPOFOL N/A 01/15/2021   Procedure: ESOPHAGOGASTRODUODENOSCOPY (EGD) WITH PROPOFOL;  Surgeon: Lemar Lofty., MD;  Location: [facility] ENDOSCOPY;  Service: Gastroenterology;  Laterality: N/A;   EUS N/A 01/15/2021   Procedure: UPPER ENDOSCOPIC ULTRASOUND (EUS) RADIAL;  Surgeon: Lemar Lofty., MD;  Location: [facility] ENDOSCOPY;  Service: Gastroenterology;  Laterality: N/A;   EYE SURGERY Bilateral    cataract removal   FINE NEEDLE ASPIRATION  01/15/2021   Procedure: FINE NEEDLE ASPIRATION (FNA) LINEAR;  Surgeon: Lemar Lofty., MD;  Location: [facility] ENDOSCOPY;  Service: Gastroenterology;;   LAPAROSCOPY N/A 07/01/2021   Procedure:  STAGING LAPAROSCOPY;  Surgeon: Fritzi Mandes, MD;  Location: [facility] OR;  Service: General;  Laterality: N/A;   LEFT HEART CATH AND CORS/GRAFTS ANGIOGRAPHY N/A 06/09/2023   Procedure: LEFT HEART CATH AND CORS/GRAFTS ANGIOGRAPHY;  Surgeon: [name], MD;  Location: [facility] INVASIVE CV LAB;  Service: Cardiovascular;  Laterality: N/A;   PORTACATH PLACEMENT Right 02/21/2021   Procedure: INSERTION PORT-A-CATH;  Surgeon: Fritzi Mandes, MD;  Location: [facility] OR;  Service: General;  Laterality: Right;   SPLENECTOMY, 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   Lisinopril Other (See Comments)    Unknown   Terazosin Other (See Comments)    Unknown    Social History   Socioeconomic History   Marital status: Married    Spouse name: Glennis   Number of children: 2   Years of education: Not on file   Highest education level: Associate degree: academic program  Occupational History   Occupation: retired  Tobacco Use   Smoking status: Former    Current packs/day: 0.00    Types: Cigarettes    Quit date: 12/14/1975    Years since quitting: 47.7   Smokeless tobacco: Never  Vaping Use   Vaping status: Not on file  Substance and Sexual Activity   Alcohol use: Not Currently   Drug use: Never   Sexual activity: Not Currently  Other Topics Concern   Not on file  Social History Narrative   ** Merged History Encounter **       Social Determinants of Health   Financial Resource Strain: Low Risk  (06/11/2023)   Overall Financial Resource Strain (CARDIA)    Difficulty of Paying Living Expenses: Not hard at all  Food Insecurity: No Food Insecurity (06/11/2023)   Hunger Vital Sign    Worried About Running Out of Food in the Last Year: Never true    Ran Out of Food in the Last Year: Never true  Transportation Needs: No Transportation Needs (06/11/2023)   PRAPARE - Administrator, Civil Service (Medical): No    Lack of Transportation (Non-Medical): No  Physical Activity: Inactive (06/11/2023)   Exercise Vital Sign    Days of Exercise per Week: 0 days    Minutes of Exercise per Session: 60 min  Stress: No Stress Concern Present (06/11/2023)   [employer] of Occupational Health - Occupational Stress Questionnaire    Feeling of Stress : Not at all  Social Connections: Socially Integrated (06/11/2023)   Social Connection and Isolation Panel [NHANES]    Frequency of Communication with Friends and Family: Twice a week    Frequency of Social Gatherings with Friends and Family: More than three times a week     Attends Religious Services: More than 4 times per year    Active Member of [employer] or Organizations: Yes    Attends Banker Meetings: More than 4 times per year    Marital Status: Married  Catering manager Violence: Not At Risk (06/09/2023)   Humiliation, Afraid, Rape, and Kick questionnaire    Fear of Current or Ex-Partner: No    Emotionally Abused: No    Physically Abused: No    Sexually Abused: No    Family History  Problem Relation Age of Onset   Arthritis Mother  Heart disease Mother    Hyperlipidemia Mother    Hypertension Mother    Heart attack Mother    Breast cancer Sister        dx 30s   Heart attack Brother    Heart disease Brother    Depression Daughter    Arthritis Sister    Cancer Sister        unknown type, dx 45s   Cancer Brother    Learning disabilities Brother    Alcohol abuse Brother    COPD Brother    Heart attack Brother    Heart disease Brother       Review of Systems Shortness of breath with activity Nonproductive cough Irregular heartbeat Acid heartburn and indigestion Feet swelling Joint stiffness    Objective:   Physical Exam  Gen. Pleasant, obese, in no distress, normal affect ENT - no pallor,icterus, no post nasal drip, class 2-3 airway Neck: No JVD, no thyromegaly, no carotid bruits Lungs: no use of accessory muscles, no dullness to percussion, decreased without rales or rhonchi  Cardiovascular: Rhythm regular, heart sounds  normal, no murmurs or gallops, no peripheral edema Abdomen: soft and non-tender, no hepatosplenomegaly, BS normal. Musculoskeletal: No deformities, no cyanosis or clubbing Neuro:  alert, non focal, no tremors       Assessment & Plan:

## 2023-09-02 ENCOUNTER — Encounter (HOSPITAL_COMMUNITY): Payer: Medicare Other

## 2023-09-02 ENCOUNTER — Ambulatory Visit (HOSPITAL_COMMUNITY): Payer: Medicare Other

## 2023-09-04 ENCOUNTER — Ambulatory Visit (HOSPITAL_COMMUNITY): Payer: Medicare Other

## 2023-09-04 ENCOUNTER — Encounter (HOSPITAL_COMMUNITY)
Admission: RE | Admit: 2023-09-04 | Discharge: 2023-09-04 | Disposition: A | Discharge status: Home / Self Care | Payer: Medicare Other | Source: Ambulatory Visit | Attending: Cardiology | Admitting: Cardiology

## 2023-09-04 DIAGNOSIS — Z79899 Other long term (current) drug therapy: Secondary | ICD-10-CM | POA: Diagnosis not present

## 2023-09-04 DIAGNOSIS — I252 Old myocardial infarction: Secondary | ICD-10-CM | POA: Diagnosis not present

## 2023-09-04 DIAGNOSIS — R079 Chest pain, unspecified: Secondary | ICD-10-CM | POA: Diagnosis not present

## 2023-09-04 DIAGNOSIS — I214 Non-ST elevation (NSTEMI) myocardial infarction: Secondary | ICD-10-CM

## 2023-09-04 DIAGNOSIS — I10 Essential (primary) hypertension: Secondary | ICD-10-CM | POA: Diagnosis not present

## 2023-09-04 DIAGNOSIS — Z48812 Encounter for surgical aftercare following surgery on the circulatory system: Secondary | ICD-10-CM | POA: Diagnosis not present

## 2023-09-04 NOTE — Progress Notes (Signed)
CARDIAC REHAB PHASE 2  Reviewed home exercise with pt today. Pt is tolerating exercise well. Pt will continue to exercise on his own by walking and doing hand weights at home for 30 minutes per session 1-2 days a week in addition to the 3 days in CRP2. Advised pt on THRR, RPE scale, hydration and temperature/humidity precautions. Reinforced NTG use, S/S to stop exercise and when to call MD vs 911. Encouraged warm up cool down and stretches with exercise sessions. Pt verbalized understanding, all questions were answered and pt was given a copy to take home.    Harrie Jeans ACSM-CEP 09/04/2023 4:19 PM

## 2023-09-07 ENCOUNTER — Encounter (HOSPITAL_COMMUNITY)
Admission: RE | Admit: 2023-09-07 | Discharge: 2023-09-07 | Disposition: A | Discharge status: Home / Self Care | Payer: Medicare Other | Source: Ambulatory Visit | Attending: Cardiology

## 2023-09-07 ENCOUNTER — Encounter (HOSPITAL_COMMUNITY): Payer: Self-pay

## 2023-09-07 ENCOUNTER — Emergency Department (HOSPITAL_COMMUNITY): Payer: Medicare Other

## 2023-09-07 ENCOUNTER — Emergency Department (HOSPITAL_COMMUNITY)
Admission: EM | Admit: 2023-09-07 | Discharge: 2023-09-07 | Disposition: A | Discharge status: Home / Self Care | Payer: Medicare Other | Attending: Emergency Medicine | Admitting: Emergency Medicine

## 2023-09-07 ENCOUNTER — Other Ambulatory Visit: Payer: Self-pay

## 2023-09-07 ENCOUNTER — Ambulatory Visit (HOSPITAL_COMMUNITY): Payer: Medicare Other

## 2023-09-07 DIAGNOSIS — M47814 Spondylosis without myelopathy or radiculopathy, thoracic region: Secondary | ICD-10-CM | POA: Insufficient documentation

## 2023-09-07 DIAGNOSIS — R079 Chest pain, unspecified: Secondary | ICD-10-CM | POA: Insufficient documentation

## 2023-09-07 DIAGNOSIS — R001 Bradycardia, unspecified: Secondary | ICD-10-CM | POA: Diagnosis not present

## 2023-09-07 DIAGNOSIS — J984 Other disorders of lung: Secondary | ICD-10-CM | POA: Diagnosis not present

## 2023-09-07 DIAGNOSIS — I252 Old myocardial infarction: Secondary | ICD-10-CM | POA: Diagnosis not present

## 2023-09-07 DIAGNOSIS — Z5189 Encounter for other specified aftercare: Secondary | ICD-10-CM | POA: Diagnosis not present

## 2023-09-07 DIAGNOSIS — I214 Non-ST elevation (NSTEMI) myocardial infarction: Secondary | ICD-10-CM

## 2023-09-07 DIAGNOSIS — R0789 Other chest pain: Secondary | ICD-10-CM | POA: Diagnosis not present

## 2023-09-07 DIAGNOSIS — K30 Functional dyspepsia: Secondary | ICD-10-CM | POA: Diagnosis not present

## 2023-09-07 DIAGNOSIS — I10 Essential (primary) hypertension: Secondary | ICD-10-CM | POA: Diagnosis not present

## 2023-09-07 LAB — CBC
HCT: 44.9 % (ref 39.0–52.0)
Hemoglobin: 14.7 g/dL (ref 13.0–17.0)
MCH: 32.8 pg (ref 26.0–34.0)
MCHC: 32.7 g/dL (ref 30.0–36.0)
MCV: 100.2 fL — ABNORMAL HIGH (ref 80.0–100.0)
Platelets: 257 10*3/uL (ref 150–400)
RBC: 4.48 MIL/uL (ref 4.22–5.81)
RDW: 14.6 % (ref 11.5–15.5)
WBC: 9.8 10*3/uL (ref 4.0–10.5)
nRBC: 0 % (ref 0.0–0.2)

## 2023-09-07 LAB — BASIC METABOLIC PANEL
Anion gap: 13 (ref 5–15)
BUN: 24 mg/dL — ABNORMAL HIGH (ref 8–23)
CO2: 23 mmol/L (ref 22–32)
Calcium: 9.1 mg/dL (ref 8.9–10.3)
Chloride: 100 mmol/L (ref 98–111)
Creatinine, Ser: 1.54 mg/dL — ABNORMAL HIGH (ref 0.61–1.24)
GFR, Estimated: 46 mL/min — ABNORMAL LOW (ref >60)
Glucose, Bld: 154 mg/dL — ABNORMAL HIGH (ref 70–99)
Potassium: 3.5 mmol/L (ref 3.5–5.1)
Sodium: 136 mmol/L (ref 135–145)

## 2023-09-07 LAB — GLUCOSE, CAPILLARY: Glucose-Capillary: 159 mg/dL — ABNORMAL HIGH (ref 70–99)

## 2023-09-07 LAB — TROPONIN I (HIGH SENSITIVITY)
Troponin I (High Sensitivity): 16 ng/L (ref <18)
Troponin I (High Sensitivity): 21 ng/L — ABNORMAL HIGH (ref <18)

## 2023-09-07 NOTE — Progress Notes (Signed)
Reports having indigestion described as mid sternal which Al says developed 2-3 minutes after getting on the nustep and shortness of breath. Al said that  he moved his level up to a 4 then moved his level back to a 3. . Al also reports having a headache. Al stopped exercising.  Denies having chest pain. Patient was bent over the nustep. Said he can't do any more today and is ready to go home. Blood pressure 144/82. Oxygen saturation 95% on room air. Telemetry rhythm Sinus with arrhythmia. Heart rate 74. Onsite provider Lucile Crater [facility] reviewed 12 lead ECG and recommended that [name] go to the emergency room for further evaluation. Patient's son notified and present. 911 called. EMS came to cardiac rehab. Report given to EMS staff. Patient was transported to the ED via stretcher for further evaluation. Al reports having no symptoms upon exit from cardiac rehab. Patient was first reluctant than agreeable to go to the ED.Thayer Headings RN BSN

## 2023-09-07 NOTE — ED Triage Notes (Addendum)
Patient BIB [facility] from cardiac rehab for sudden onset of indigestion feeling while on the treadmill. Patient reports sensation resolved after rest. EKG performed showed "changes" since his EKG in June and recommended he get checked out. A&Ox4, VSS, EMS gave 324mg  aspirin.

## 2023-09-07 NOTE — ED Provider Notes (Signed)
[place] EMERGENCY DEPARTMENT AT [facility] Provider Note   CSN: [id number] Arrival date & time: 09/07/23  1704     History Chief Complaint  Patient presents with   Chest Pain    HPI [name] is a 77 y.o. male presenting for history of ACS and episode of chest pain at cardiac rehab today. Sent in from cardiac rehab team.  Patient's recorded medical, surgical, social, medication list and allergies were reviewed in the Snapshot window as part of the initial history.   Review of Systems   Review of Systems  Constitutional:  Negative for chills and fever.  HENT:  Negative for ear pain and sore throat.   Eyes:  Negative for pain and visual disturbance.  Respiratory:  Positive for chest tightness. Negative for cough and shortness of breath.   Cardiovascular:  Negative for chest pain and palpitations.  Gastrointestinal:  Negative for abdominal pain and vomiting.  Genitourinary:  Negative for dysuria and hematuria.  Musculoskeletal:  Negative for arthralgias and back pain.  Skin:  Negative for color change and rash.  Neurological:  Negative for seizures and syncope.  All other systems reviewed and are negative.   Physical Exam Updated Vital Signs BP (!) 153/71 (BP Location: Right Arm)   Pulse 60   Temp 97.9 F (36.6 C)   Resp 16   SpO2 95%  Physical Exam Vitals and nursing note reviewed.  Constitutional:      General: He is not in acute distress.    Appearance: He is well-developed.  HENT:     Head: Normocephalic and atraumatic.  Eyes:     Conjunctiva/sclera: Conjunctivae normal.  Cardiovascular:     Rate and Rhythm: Normal rate and regular rhythm.     Heart sounds: No murmur heard. Pulmonary:     Effort: Pulmonary effort is normal. No respiratory distress.     Breath sounds: Normal breath sounds.  Abdominal:     Palpations: Abdomen is soft.     Tenderness: There is no abdominal tenderness.  Musculoskeletal:        General: No swelling.      Cervical back: Neck supple.  Skin:    General: Skin is warm and dry.     Capillary Refill: Capillary refill takes less than 2 seconds.  Neurological:     Mental Status: He is alert.  Psychiatric:        Mood and Affect: Mood normal.      ED Course/ Medical Decision Making/ A&P    Procedures Procedures   Medications Ordered in ED Medications - No data to display Medical Decision Making: [name] is a 77 y.o. male who presented to the ED today with chest pain, detailed above.  Based on patient's comorbidities, patient is baseline high risk for ACs.    Patient placed on continuous vitals and telemetry monitoring while in ED which was reviewed periodically.  Complete initial physical exam performed, notably the patient was HDS in NAD.   Reviewed and confirmed nursing documentation for past medical history, family history, social history.    Initial Assessment: With the patient's presentation of left-sided chest pain, most likely diagnosis is musculoskeletal chest pain versus GERD, although ACS remains on the differential. Other diagnoses were considered including (but not limited to) pulmonary embolism, community-acquired pneumonia, aortic dissection, pneumothorax, underlying bony abnormality, anemia. These are considered less likely due to history of present illness and physical exam findings.    In particular, concerning pulmonary embolism: Patient is  PERC + and the they deny malignancy, recent surgery, history of DVT, or calf tenderness leading to a low risk Wells score. Aortic Dissection also reconsidered but seems less likely based on the location, quality, onset, and severity of symptoms in this case. Patient also has a lack of underlying history of AD or TAA.  This is most consistent with an acute life/limb threatening illness complicated by underlying chronic conditions.   Initial Plan: Evaluate for ACS with delta troponin and EKG evaluated as below  Evaluate  for dissection, bony abnormality, or pneumonia with chest x-ray and screening laboratory evaluation including CBC, BMP  Further evaluation for pulmonary embolism not indicated at this time based on patient's HPI and Wells score.  Further evaluation for Thoracic Aortic Dissection not indicated at this time based on patient's clinical history and PE findings.   Initial Study Results: EKG was reviewed independently. Rate, rhythm, axis, intervals all examined and without medically relevant abnormality. ST segments without concerns for elevations.    Laboratory  Delta troponin demonstrated slight rise  CBC and BMP without obvious metabolic or inflammatory abnormalities requiring further evaluation   Radiology  DG Chest 1 View  Result Date: 09/07/2023 CLINICAL DATA:  Chest pain. EXAM: CHEST  1 VIEW COMPARISON:  June 08, 2023 FINDINGS: There is stable right-sided venous Port-A-Cath positioning. Multiple sternal wires are present. The heart size and mediastinal contours are within normal limits. Very mild scarring and/or atelectasis is seen within the left lung base. No pleural effusion or pneumothorax is identified. Radiopaque surgical clips are seen overlying the medial aspect of the left upper quadrant. Multilevel degenerative changes are seen throughout the thoracic spine. IMPRESSION: 1. Evidence of prior median sternotomy. 2. Very mild left basilar scarring and/or atelectasis. Electronically Signed   By: Aram Candela M.D.   On: 09/07/2023 20:58    Final Assessment and Plan: Patient observed in emergency room for 5 hours. Every time I walked by him, he asked to be discharged. After second troponin resulted with a serial delta that was positive I discussed at bedside.  He has been asymptomatic for the duration of his stay here in the emergency room.  I discussed his mild elevation and recommended a tertiary sample.  He is stated that he does not want stay for any further samples as he is  asymptomatic and that he would rather follow-up in the outpatient setting with his cardiologist.  Given his profound weight, lack of symptoms and understanding of risk including risk of death I do not believe there is any indication to hold patient against his request.  Disposition:  Patient is requesting discharge at this time.  Given patient's understanding of risk of severe missed diagnosis based on limitations of today's evaluation and risk of interval worsening of disease including life or limb threatening pathology, will participate in shared medical decision making and patient directed discharge at this time.  Patient is welcome to return for further diagnostic evaluation/therapeutic management at any time.            Clinical Impression:  1. Chest pain, unspecified type      Discharge   Final Clinical Impression(s) / ED Diagnoses Final diagnoses:  Chest pain, unspecified type    Rx / DC Orders ED Discharge Orders     None         Glyn Ade, MD 09/08/23 0020

## 2023-09-07 NOTE — ED Provider Triage Note (Signed)
Emergency Medicine Provider Triage Evaluation Note  Drennan Bussiere , a 77 y.o. male  was evaluated in triage.  Pt was at cardiac rehab earlier today when he felt like he was having indigestion.  The symptoms resolved on its own.  Patient has an extensive heart history including triple bypass and multiple stents.  He states that he had imaging done which showed an occlusion at one of his stents, a couple months ago.  Review of Systems  Positive: Indigestion Negative: Chest pain, back pain, SOB  Physical Exam  BP (!) 158/81   Pulse 65   Temp 98.1 F (36.7 C)   Resp 18   SpO2 96%  Gen:   Awake, no distress   Resp:  Normal effort  MSK:   Moves extremities without difficulty  Other:    Medical Decision Making  Medically screening exam initiated at 5:56 PM.  Appropriate orders placed.  [name] was informed that the remainder of the evaluation will be completed by another provider, this initial triage assessment does not replace that evaluation, and the importance of remaining in the ED until their evaluation is complete.   Maxwell Marion, PA-C 09/07/23 1800

## 2023-09-08 ENCOUNTER — Telehealth: Payer: Self-pay | Admitting: Cardiology

## 2023-09-08 ENCOUNTER — Telehealth (HOSPITAL_COMMUNITY): Payer: Self-pay | Admitting: *Deleted

## 2023-09-08 NOTE — Telephone Encounter (Signed)
See ED encounter and advise, also printed

## 2023-09-08 NOTE — Telephone Encounter (Signed)
Office calling to find out when pt can return to rehab do to him going to the hospital yesterday. Does he need to be seen in the office as well or is he okay to return. Please advise

## 2023-09-08 NOTE — Telephone Encounter (Signed)
Spoke with [name]. Notified will need to hold exercise until cleared to return to exercise per Dr Cristal Deer post ED visit on 09/07/23 per on site provider Carlos Levering DNP will call triage to facilitate. Patient and wife state understanding.Thayer Headings RN BSN

## 2023-09-09 ENCOUNTER — Ambulatory Visit (HOSPITAL_COMMUNITY): Payer: Medicare Other

## 2023-09-09 ENCOUNTER — Encounter (HOSPITAL_COMMUNITY): Payer: Medicare Other

## 2023-09-09 NOTE — Telephone Encounter (Signed)
Called patient and scheduled him for 10/2 at 0825 at [facility] office

## 2023-09-09 NOTE — Telephone Encounter (Signed)
He should be seen. Looks like ER recommended him getting a third troponin and he declined. His second went up slightly, which is concerning. I'd get him a visit to make sure he doesn't need further evaluation. Thanks.

## 2023-09-09 NOTE — Progress Notes (Signed)
Cardiac Individual Treatment Plan  Patient Details  Name: [name] MRN: [id number] Date of Birth: March 01, 1946 Referring Provider:   Flowsheet Row INTENSIVE CARDIAC REHAB ORIENT from 08/11/2023 in [facility] for [facility], [facility]  Referring Provider Jodelle Red, MD       Initial Encounter Date:  Flowsheet Row INTENSIVE CARDIAC REHAB ORIENT from 08/11/2023 in [facility] for Heart, Vascular, & Lung [facility]  Date 08/11/23       Visit Diagnosis: 06/08/23 NSTEMI (non-ST elevated myocardial infarction) [facility])  Chest pain, unspecified type - Plan: EKG 12-Lead, EKG 12-Lead  Patient's Home Medications on Admission:  Current Outpatient Medications:    acetaminophen (TYLENOL) 500 MG tablet, Take 1,000 mg by mouth in the morning, at noon, and at bedtime., Disp: , Rfl:    amLODipine (NORVASC) 10 MG tablet, Take 5 mg by mouth daily., Disp: , Rfl:    apixaban (ELIQUIS) 5 MG TABS tablet, Take 5 mg by mouth 2 (two) times daily., Disp: , Rfl:    aspirin EC 81 MG tablet, Take 1 tablet (81 mg total) by mouth daily then as directed by provider. Swallow whole., Disp: 90 tablet, Rfl: 3   atorvastatin (LIPITOR) 80 MG tablet, Take 40 mg by mouth at bedtime., Disp: , Rfl:    Calcium Carbonate Antacid (TUMS PO), Take 2 tablets by mouth as needed (reflux)., Disp: , Rfl:    calcium-vitamin D (OSCAL WITH D) 500-200 MG-UNIT tablet, Take 1 tablet by mouth 2 (two) times daily., Disp: , Rfl:    CRANBERRY PO, Take 1 tablet by mouth in the morning and at bedtime., Disp: , Rfl:    empagliflozin (JARDIANCE) 10 MG TABS tablet, Take 1 tablet (10 mg total) by mouth daily., Disp: 90 tablet, Rfl: 3   ferrous sulfate 325 (65 FE) MG tablet, Take 1 tablet (325 mg total) by mouth daily with breakfast., Disp: 90 tablet, Rfl: 2   FLUoxetine (PROZAC) 20 MG capsule, Take 40 mg by mouth daily., Disp: , Rfl:    furosemide (LASIX) 40 MG tablet, Take 1  tablet (40 mg total) by mouth daily., Disp: 90 tablet, Rfl: 3   glucose blood (ONETOUCH VERIO) test strip, Use to test blood sugars 1-2 times daily., Disp: 200 each, Rfl: 12   insulin aspart (NOVOLOG) 100 UNIT/ML injection, Inject 3-5 Units into the skin See admin instructions. Inject 3 units at lunch time and 5 units at dinner, Disp: , Rfl:    insulin glargine (LANTUS) 100 UNIT/ML injection, Inject 0.5 mLs (50 Units total) into the skin daily., Disp: 15 mL, Rfl: 3   metoprolol tartrate (LOPRESSOR) 25 MG tablet, TAKE 1/2 TABLET(12.5 MG) BY MOUTH TWICE DAILY, Disp: 90 tablet, Rfl: 3   Multiple Vitamin (MULTI-VITAMINS) TABS, Take 1 tablet by mouth daily., Disp: , Rfl:    nitroGLYCERIN (NITROSTAT) 0.3 MG SL tablet, Place 0.3 mg under the tongue every 5 (five) minutes as needed for chest pain., Disp: , Rfl:    pantoprazole (PROTONIX) 40 MG tablet, Take 1 tablet (40 mg total) by mouth daily., Disp: 90 tablet, Rfl: 3   Probiotic Product (PROBIOTIC ADVANCED PO), Take 1 capsule by mouth at bedtime., Disp: , Rfl:    tamsulosin (FLOMAX) 0.4 MG CAPS capsule, Take 0.8 mg by mouth at bedtime., Disp: , Rfl:    traZODone (DESYREL) 100 MG tablet, Take 100 mg by mouth at bedtime., Disp: , Rfl:    vitamin B-12 (CYANOCOBALAMIN) 1000 MCG tablet, Take 1,000 mcg  by mouth daily., Disp: , Rfl:    Wheat Dextrin (BENEFIBER DRINK MIX PO), Take 1 Scoop by mouth at bedtime., Disp: , Rfl:   Past Medical History: Past Medical History:  Diagnosis Date   A-fib (HCC) 03/04/2021   Anemia    Arthritis    Cancer (HCC)    prostate   Chronic kidney disease    blood in urine    Constipation    Coronary artery disease    Depression    Diabetes mellitus without complication (HCC)    Difficult intubation    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 it was hard to get the tube down his throat   Dyspnea    Family history of breast cancer    Fatty liver    Fatty liver    Frequent headaches    GERD (gastroesophageal reflux disease)    History of  chicken pox    History of fainting spells of unknown cause    History of prostate cancer    Hyperlipidemia    Hypertension    Myocardial infarction (HCC)    Peripheral neuropathy    Pneumonia    Prostate cancer (HCC)    PTSD (post-traumatic stress disorder)    Sleep apnea    uses Cpap    Tobacco Use: Social History   Tobacco Use  Smoking Status Former   Current packs/day: 0.00   Types: Cigarettes   Quit date: 12/14/1975   Years since quitting: 47.7  Smokeless Tobacco Never    Labs: Review Flowsheet  More data exists      Latest Ref Rng & Units 02/21/2022 04/08/2022 07/07/2022 12/10/2022 06/09/2023  Labs for ITP Cardiac and Pulmonary Rehab  Cholestrol 0 - 200 mg/dL 409  - - - 95   LDL (calc) 0 - 99 mg/dL 66  - - - 47   HDL-C >81 mg/dL 19.14  - - - 40   Trlycerides <150 mg/dL 782.9  - - - 42   Hemoglobin A1c 4.8 - 5.6 % - 9.9  6.8  6.8  7.5     Details            Capillary Blood Glucose: Lab Results  Component Value Date   GLUCAP 159 (H) 09/07/2023   GLUCAP 111 (H) 08/24/2023   GLUCAP 148 (H) 08/24/2023   GLUCAP 143 (H) 08/19/2023   GLUCAP 184 (H) 08/19/2023     Exercise Target Goals: Exercise Program Goal: Individual exercise prescription set using results from initial 6 min walk test and THRR while considering  patient's activity barriers and safety.   Exercise Prescription Goal: Initial exercise prescription builds to 30-45 minutes a day of aerobic activity, 2-3 days per week.  Home exercise guidelines will be given to patient during program as part of exercise prescription that the participant will acknowledge.  Activity Barriers & Risk Stratification:  Activity Barriers & Cardiac Risk Stratification - 08/11/23 1218       Activity Barriers & Cardiac Risk Stratification   Activity Barriers Arthritis;Back Problems;Joint Problems;Deconditioning;Muscular Weakness;Shortness of Breath;Balance Concerns;History of Falls;Assistive Device;Other (comment)     Comments Neuropathy in the feet    Cardiac Risk Stratification High             6 Minute Walk:  6 Minute Walk     Row Name 08/11/23 1045         6 Minute Walk   Phase Initial  Used rollator     Distance 600 feet  Used rollator     Walk  Time 6 minutes     # of Rest Breaks 0     MPH 1.14     METS 1     RPE 13     Perceived Dyspnea  2     VO2 Peak 3.4     Symptoms Yes (comment)     Comments SOB, RPD = 2, chronic Rt hip pain 3/10, legs tired, arms tired from leaning on rollator     Resting HR 65 bpm     Resting BP 130/66     Resting Oxygen Saturation  95 %     Exercise Oxygen Saturation  during 6 min walk 95 %     Max Ex. HR 103 bpm     Max Ex. BP 154/68     2 Minute Post BP 148/74              Oxygen Initial Assessment:   Oxygen Re-Evaluation:   Oxygen Discharge (Final Oxygen Re-Evaluation):   Initial Exercise Prescription:  Initial Exercise Prescription - 08/11/23 1200       Date of Initial Exercise RX and Referring Provider   Date 08/11/23    Referring Provider Jodelle Red, MD    Expected Discharge Date 11/04/23      T5 Nustep   Level 1    SPM 60    Minutes 25    METs 1      Prescription Details   Frequency (times per week) 3    Duration Progress to 30 minutes of continuous aerobic without signs/symptoms of physical distress      Intensity   THRR 40-80% of Max Heartrate 57 - 114    Ratings of Perceived Exertion 11-13    Perceived Dyspnea 0-4      Progression   Progression Continue progressive overload as per policy without signs/symptoms or physical distress.      Resistance Training   Training Prescription Yes    Weight 3 lbs    Reps 10-15             Perform Capillary Blood Glucose checks as needed.  Exercise Prescription Changes:   Exercise Prescription Changes     Row Name 08/19/23 1637 09/04/23 1613           Response to Exercise   Blood Pressure (Admit) 130/78 122/62      Blood Pressure (Exercise)  134/70 128/78      Blood Pressure (Exit) 110/60 124/68      Heart Rate (Admit) 55 bpm 73 bpm      Heart Rate (Exercise) 74 bpm 91 bpm      Heart Rate (Exit) 55 bpm 66 bpm      Rating of Perceived Exertion (Exercise) 11 11      Perceived Dyspnea (Exercise) 0 0      Comments Pt first day in the [facility]2 program REVD MET's, goals and home ExRx      Duration Progress to 30 minutes of  aerobic without signs/symptoms of physical distress Progress to 30 minutes of  aerobic without signs/symptoms of physical distress      Intensity THRR unchanged THRR unchanged        Progression   Progression Continue to progress workloads to maintain intensity without signs/symptoms of physical distress. Continue to progress workloads to maintain intensity without signs/symptoms of physical distress.      Average METs 2.6 3        Resistance Training   Training Prescription No Yes  Weight -- 3 lbs wts      Reps -- 10-15      Time -- 10 Minutes        T5 Nustep   Level 1 3      SPM 100 89      Minutes 25 25      METs 2.3 3        Home Exercise Plan   Plans to continue exercise at -- Home (comment)      Frequency -- Add 2 additional days to program exercise sessions.      Initial Home Exercises Provided -- 09/04/23               Exercise Comments:   Exercise Comments     Row Name 08/19/23 1643 09/04/23 1618         Exercise Comments Pt first day in the [facility] ICR program. Pt tolerated exercise well with an average MET level of 2.6. Pt is learning his THRR, RPE and ExRx Reviewed MET's, goals and home ExRx. Pt tolerated exercise well with an average MET level of 3.0. Pt is doing well and progressing his MET's, he feels good with the progress he's making and feels good about his goals. He is increasing strength and stamina and feels he's able to do more with his grandson now. He will exercise on his own by walking and doing hand weights at home 1-2 days a week for 30 mins per session                Exercise Goals and Review:   Exercise Goals     Row Name 07/02/23 1332 08/11/23 1258           Exercise Goals   Increase Physical Activity Yes Yes      Intervention Provide advice, education, support and counseling about physical activity/exercise needs.;Develop an individualized exercise prescription for aerobic and resistive training based on initial evaluation findings, risk stratification, comorbidities and participant's personal goals. Provide advice, education, support and counseling about physical activity/exercise needs.;Develop an individualized exercise prescription for aerobic and resistive training based on initial evaluation findings, risk stratification, comorbidities and participant's personal goals.      Expected Outcomes Short Term: Attend rehab on a regular basis to increase amount of physical activity.;Long Term: Exercising regularly at least 3-5 days a week.;Long Term: Add in home exercise to make exercise part of routine and to increase amount of physical activity. Short Term: Attend rehab on a regular basis to increase amount of physical activity.;Long Term: Exercising regularly at least 3-5 days a week.;Long Term: Add in home exercise to make exercise part of routine and to increase amount of physical activity.      Increase Strength and Stamina Yes Yes      Intervention Provide advice, education, support and counseling about physical activity/exercise needs.;Develop an individualized exercise prescription for aerobic and resistive training based on initial evaluation findings, risk stratification, comorbidities and participant's personal goals. Provide advice, education, support and counseling about physical activity/exercise needs.;Develop an individualized exercise prescription for aerobic and resistive training based on initial evaluation findings, risk stratification, comorbidities and participant's personal goals.      Expected Outcomes Short Term:  Increase workloads from initial exercise prescription for resistance, speed, and METs.;Long Term: Improve cardiorespiratory fitness, muscular endurance and strength as measured by increased METs and functional capacity ( );Short Term: Perform resistance training exercises routinely during rehab and add in resistance training at home Short Term: Increase workloads from initial exercise  prescription for resistance, speed, and METs.;Long Term: Improve cardiorespiratory fitness, muscular endurance and strength as measured by increased METs and functional capacity ( );Short Term: Perform resistance training exercises routinely during rehab and add in resistance training at home      Able to understand and use rate of perceived exertion (RPE) scale Yes Yes      Intervention Provide education and explanation on how to use RPE scale Provide education and explanation on how to use RPE scale      Expected Outcomes Short Term: Able to use RPE daily in rehab to express subjective intensity level;Long Term:  Able to use RPE to guide intensity level when exercising independently Short Term: Able to use RPE daily in rehab to express subjective intensity level;Long Term:  Able to use RPE to guide intensity level when exercising independently      Able to understand and use Dyspnea scale Yes --      Intervention Provide education and explanation on how to use Dyspnea scale Provide education and explanation on how to use Dyspnea scale      Expected Outcomes Short Term: Able to use Dyspnea scale daily in rehab to express subjective sense of shortness of breath during exertion;Long Term: Able to use Dyspnea scale to guide intensity level when exercising independently Short Term: Able to use Dyspnea scale daily in rehab to express subjective sense of shortness of breath during exertion;Long Term: Able to use Dyspnea scale to guide intensity level when exercising independently      Knowledge and understanding of Target Heart  Rate Range (THRR) Yes Yes      Intervention Provide education and explanation of THRR including how the numbers were predicted and where they are located for reference Provide education and explanation of THRR including how the numbers were predicted and where they are located for reference      Expected Outcomes Short Term: Able to state/look up THRR;Short Term: Able to use daily as guideline for intensity in rehab;Long Term: Able to use THRR to govern intensity when exercising independently Short Term: Able to state/look up THRR;Short Term: Able to use daily as guideline for intensity in rehab;Long Term: Able to use THRR to govern intensity when exercising independently      Understanding of Exercise Prescription Yes Yes      Intervention Provide education, explanation, and written materials on patient's individual exercise prescription Provide education, explanation, and written materials on patient's individual exercise prescription      Expected Outcomes Short Term: Able to explain program exercise prescription;Long Term: Able to explain home exercise prescription to exercise independently Short Term: Able to explain program exercise prescription;Long Term: Able to explain home exercise prescription to exercise independently               Exercise Goals Re-Evaluation :  Exercise Goals Re-Evaluation     Row Name 08/19/23 1641 09/04/23 1616           Exercise Goal Re-Evaluation   Exercise Goals Review Increase Physical Activity;Understanding of Exercise Prescription;Increase Strength and Stamina;Knowledge and understanding of Target Heart Rate Range (THRR);Able to understand and use rate of perceived exertion (RPE) scale Increase Physical Activity;Understanding of Exercise Prescription;Increase Strength and Stamina;Knowledge and understanding of Target Heart Rate Range (THRR);Able to understand and use rate of perceived exertion (RPE) scale      Comments Pt first day in the [facility] ICR  program. Pt tolerated exercise well with an average MET level of 2.6. Pt is learning his THRR, RPE  and ExRx Reviewed MET's, goals and home ExRx. Pt tolerated exercise well with an average MET level of 3.0. Pt is doing well and progressing his MET's, he feels good with the progress he's making and feels good about his goals. He is increasing strength and stamina and feels he's able to do more with his grandson now. He will exercise on his own by walking and doing hand weights at home 1-2 days a week for 30 mins per session      Expected Outcomes Will continue to monitor pt and progress workloads as tolerated without sign or symptom Will continue to monitor pt and progress workloads as tolerated without sign or symptom               Discharge Exercise Prescription (Final Exercise Prescription Changes):  Exercise Prescription Changes - 09/04/23 1613       Response to Exercise   Blood Pressure (Admit) 122/62    Blood Pressure (Exercise) 128/78    Blood Pressure (Exit) 124/68    Heart Rate (Admit) 73 bpm    Heart Rate (Exercise) 91 bpm    Heart Rate (Exit) 66 bpm    Rating of Perceived Exertion (Exercise) 11    Perceived Dyspnea (Exercise) 0    Comments REVD MET's, goals and home ExRx    Duration Progress to 30 minutes of  aerobic without signs/symptoms of physical distress    Intensity THRR unchanged      Progression   Progression Continue to progress workloads to maintain intensity without signs/symptoms of physical distress.    Average METs 3      Resistance Training   Training Prescription Yes    Weight 3 lbs wts    Reps 10-15    Time 10 Minutes      T5 Nustep   Level 3    SPM 89    Minutes 25    METs 3      Home Exercise Plan   Plans to continue exercise at Home (comment)    Frequency Add 2 additional days to program exercise sessions.    Initial Home Exercises Provided 09/04/23             Nutrition:  Target Goals: Understanding of nutrition guidelines, daily  intake of sodium 1500mg , cholesterol 200mg , calories 30% from fat and 7% or less from saturated fats, daily to have 5 or more servings of fruits and vegetables.  Biometrics:  Pre Biometrics - 08/11/23 0955       Pre Biometrics   Waist Circumference 46.5 inches    Hip Circumference 48.5 inches    Waist to Hip Ratio 0.96 %    Triceps Skinfold 25 mm    % Body Fat 35.7 %    Grip Strength 20 kg    Flexibility 0 in   Unable to reach   Single Leg Stand 0 seconds   Pt unable to perform due to poor balance             Nutrition Therapy Plan and Nutrition Goals:  Nutrition Therapy & Goals - 08/19/23 1540       Nutrition Therapy   Diet Heart healthy/Carbohydrate Consistent Diet    Drug/Food Interactions Statins/Certain Fruits      Personal Nutrition Goals   Nutrition Goal Patient to identify strategies for reducing cardiovascular risk by attending the [facility] education and nutrition series weekly.    Personal Goal #2 Patient to improve diet quality by using the plate method as a guide for  meal planning to include lean protein/plant protein, fruits, vegetables, whole grains, nonfat dairy as part of a well-balanced diet.    Personal Goal #3 Patient to reduce sodium intake to 1500mg  per day    Personal Goal #4 Patient to identify strategies for improving blood sugar control with goal A1c <7%    Comments Al has medical history CKD3, pancreatic cancer, HTN, Afib, CAD, PAD, heart failure, His wife is a good support. Patient will benefit from participation in intensive cardiac rehab for nutrition, exercise, and lifestyle modification.      Intervention Plan   Intervention Prescribe, educate and counsel regarding individualized specific dietary modifications aiming towards targeted core components such as weight, hypertension, lipid management, diabetes, heart failure and other comorbidities.;Nutrition handout(s) given to patient.    Expected Outcomes Short Term Goal: Understand basic  principles of dietary content, such as calories, fat, sodium, cholesterol and nutrients.;Long Term Goal: Adherence to prescribed nutrition plan.             Nutrition Assessments:  Nutrition Assessments - 08/19/23 1601       Rate Your Plate Scores   Pre Score 39            MEDIFICTS Score Key: >=70 Need to make dietary changes  40-70 Heart Healthy Diet <= 40 Therapeutic Level Cholesterol Diet   Flowsheet Row INTENSIVE CARDIAC REHAB from 08/19/2023 in [facility] Heart, V[facility], & Lung Health  Picture Your Plate Total Score on Admission 39      Picture Your Plate Scores: <25 Unhealthy dietary pattern with much room for improvement. 41-50 Dietary pattern unlikely to meet recommendations for good health and room for improvement. 51-60 More healthful dietary pattern, with some room for improvement.  >60 Healthy dietary pattern, although there may be some specific behaviors that could be improved.    Nutrition Goals Re-Evaluation:  Nutrition Goals Re-Evaluation     Row Name 08/19/23 1540             Goals   Current Weight 255 lb 4.7 oz (115.8 kg)       Comment A1c 7.5, Cr 1.54, GFR 54, HDL 40, LipoproteinA WNL       Expected Outcome Al has medical history CKD3, pancreatic cancer, HTN, Afib, CAD, PAD, heart failure, His wife is a good support. Patient will benefit from participation in intensive cardiac rehab for nutrition, exercise, and lifestyle modification.                Nutrition Goals Re-Evaluation:  Nutrition Goals Re-Evaluation     Row Name 08/19/23 1540             Goals   Current Weight 255 lb 4.7 oz (115.8 kg)       Comment A1c 7.5, Cr 1.54, GFR 54, HDL 40, LipoproteinA WNL       Expected Outcome Al has medical history CKD3, pancreatic cancer, HTN, Afib, CAD, PAD, heart failure, His wife is a good support. Patient will benefit from participation in intensive cardiac rehab for nutrition, exercise, and lifestyle  modification.                Nutrition Goals Discharge (Final Nutrition Goals Re-Evaluation):  Nutrition Goals Re-Evaluation - 08/19/23 1540       Goals   Current Weight 255 lb 4.7 oz (115.8 kg)    Comment A1c 7.5, Cr 1.54, GFR 54, HDL 40, LipoproteinA WNL    Expected Outcome Al has medical history CKD3, pancreatic cancer,  HTN, Afib, CAD, PAD, heart failure, His wife is a good support. Patient will benefit from participation in intensive cardiac rehab for nutrition, exercise, and lifestyle modification.             Psychosocial: Target Goals: Acknowledge presence or absence of significant depression and/or stress, maximize coping skills, provide positive support system. Participant is able to verbalize types and ability to use techniques and skills needed for reducing stress and depression.  Initial Review & Psychosocial Screening:  Initial Psych Review & Screening - 08/11/23 1215       Initial Review   Current issues with History of Depression;Current Psychotropic Meds;Current Depression      Family Dynamics   Good Support System? Yes   Has sposue, son daughter, and grandson for support     Barriers   Psychosocial barriers to participate in program The patient should benefit from training in stress management and relaxation.      Screening Interventions   Interventions Encouraged to exercise    Expected Outcomes Short Term goal: Identification and review with participant of any Quality of Life or Depression concerns found by scoring the questionnaire.;Long Term goal: The participant improves quality of Life and PHQ9 Scores as seen by post scores and/or verbalization of changes             Quality of Life Scores:  Quality of Life - 08/11/23 1259       Quality of Life   Select Quality of Life      Quality of Life Scores   Health/Function Pre 10.9 %    Socioeconomic Pre 25.21 %    Psych/Spiritual Pre 22.07 %    Family Pre 30 %    GLOBAL Pre 18.96 %             Scores of 19 and below usually indicate a poorer quality of life in these areas.  A difference of  2-3 points is a clinically meaningful difference.  A difference of 2-3 points in the total score of the Quality of Life Index has been associated with significant improvement in overall quality of life, self-image, physical symptoms, and general health in studies assessing change in quality of life.  PHQ-9: Review Flowsheet  More data exists      08/11/2023 06/12/2023 12/16/2022 12/10/2022 07/07/2022  Depression screen PHQ 2/9  Decreased Interest 2 3 0 0 0  Down, Depressed, Hopeless 0 2 0 0 0  PHQ - 2 Score 2 5 0 0 0  Altered sleeping 0 0 0 0 0  Tired, decreased energy 2 3 0 2 0  Change in appetite 1 3 0 3 0  Feeling bad or failure about yourself  0 3 0 0 0  Trouble concentrating 1 3 0 0 0  Moving slowly or fidgety/restless 0 0 0 0 0  Suicidal thoughts 0 0 0 0 0  PHQ-9 Score 6 17 0 5 0  Difficult doing work/chores Somewhat difficult Very difficult Not difficult at all Not difficult at all -    Details           Interpretation of Total Score  Total Score Depression Severity:  1-4 = Minimal depression, 5-9 = Mild depression, 10-14 = Moderate depression, 15-19 = Moderately severe depression, 20-27 = Severe depression   Psychosocial Evaluation and Intervention:   Psychosocial Re-Evaluation:  Psychosocial Re-Evaluation     Row Name 08/20/23 1409 09/09/23 0842           Psychosocial Re-Evaluation  Current issues with History of Depression;Current Stress Concerns Current Stress Concerns;History of Depression      Comments Al admits to being dissatisfied with his health due to his recent MI and deconditioned state of health. Al admits to being inactive and hopes that participating in cardiac rehab will increase his endurance. Al admits to overeating, admits to like eating ice cream every day. Al denies being depressed.  Reviewed quality of life forwarded to PCP, Dr [name] [place].  Al has not voiced any increased concerns or stresors during exercise at cardiac rehab. Exercise is currently on hold.      Expected Outcomes -- Al will have controlled or decreased depression/ stressors upon completion of cardiac rehab.      Interventions Encouraged to attend Cardiac Rehabilitation for the exercise;Stress management education Encouraged to attend Cardiac Rehabilitation for the exercise;Stress management education;Relaxation education      Continue Psychosocial Services  Follow up required by staff Follow up required by staff        Initial Review   Source of Stress Concerns Chronic Illness;Unable to perform yard/household activities;Unable to participate in former interests or hobbies --      Comments Will continue to monitor and noffer support as needed Al will continue to participate in cardiac rehab for exercise, nutrition and lifestyle modifications               Psychosocial Discharge (Final Psychosocial Re-Evaluation):  Psychosocial Re-Evaluation - 09/09/23 4098       Psychosocial Re-Evaluation   Current issues with Current Stress Concerns;History of Depression    Comments Al has not voiced any increased concerns or stresors during exercise at cardiac rehab. Exercise is currently on hold.    Expected Outcomes Al will have controlled or decreased depression/ stressors upon completion of cardiac rehab.    Interventions Encouraged to attend Cardiac Rehabilitation for the exercise;Stress management education;Relaxation education    Continue Psychosocial Services  Follow up required by staff      Initial Review   Comments Al will continue to participate in cardiac rehab for exercise, nutrition and lifestyle modifications             Vocational Rehabilitation: Provide vocational rehab assistance to qualifying candidates.   Vocational Rehab Evaluation & Intervention:  Vocational Rehab - 08/11/23 1217       Initial Vocational Rehab Evaluation & Intervention    Assessment shows need for Vocational Rehabilitation No   Pt is retired            Education: Education Goals: Education classes will be provided on a weekly basis, covering required topics. Participant will state understanding/return demonstration of topics presented.    Education     Row Name 08/19/23 1500     Education   Cardiac Education Topics Pritikin   Orthoptist   Educator Dietitian   Weekly Topic Personalizing Your Pritikin Plate   Instruction Review Code 1- Verbalizes Understanding   Class Start Time 1400   Class Stop Time 1440   Class Time Calculation (min) 40 min    Row Name 08/21/23 1500     Education   Cardiac Education Topics Pritikin   Select Workshops     Workshops   Educator Exercise Physiologist   Select Exercise   Exercise Workshop Location manager and Fall Prevention   Instruction Review Code 1- Verbalizes Understanding   Class Start Time 1405   Class Stop Time 1455   Class Time Calculation (min)  50 min    Row Name 08/24/23 1600     Education   Cardiac Education Topics Pritikin   Glass blower/designer Nutrition   Nutrition Workshop Label Reading   Instruction Review Code 1- Tax inspector   Class Start Time 1400   Class Stop Time 1445   Class Time Calculation (min) 45 min    Row Name 08/26/23 1600     Education   Cardiac Education Topics Pritikin   Customer service manager   Weekly Topic [employer] Desserts   Instruction Review Code 1- Verbalizes Understanding   Class Start Time 1400   Class Stop Time 1442   Class Time Calculation (min) 42 min    Row Name 08/28/23 1500     Education   Cardiac Education Topics Pritikin   Licensed conveyancer Nutrition   Nutrition Other  label reading   Instruction Review Code 1- Verbalizes Understanding   Class Start Time 1400    Class Stop Time 1500   Class Time Calculation (min) 60 min    Row Name 08/31/23 1500     Workshops   Biomedical scientist Psychosocial   Psychosocial Workshop Recognizing and Reducing Stress   Instruction Review Code 1- Verbalizes Understanding   Class Start Time 1400   Class Stop Time 1447   Class Time Calculation (min) 47 min    Row Name 09/04/23 1400     Education   Cardiac Education Topics Pritikin   Nurse, children's Exercise Physiologist   Select Nutrition   Nutrition Calorie Density   Instruction Review Code 1- Verbalizes Understanding   Class Start Time 1358   Class Stop Time 1432   Class Time Calculation (min) 34 min    Row Name 09/07/23 1500     Education   Cardiac Education Topics Pritikin   Select Workshops     Workshops   Educator Exercise Physiologist   Select Exercise   Exercise Workshop Exercise Basics: Building Your Action Plan   Instruction Review Code 1- Verbalizes Understanding   Class Start Time 1358   Class Stop Time 1449   Class Time Calculation (min) 51 min            Core Videos: Exercise    Move It!  Clinical staff conducted group or individual video education with verbal and written material and guidebook.  Patient learns the recommended Pritikin exercise program. Exercise with the goal of living a long, healthy life. Some of the health benefits of exercise include controlled diabetes, healthier blood pressure levels, improved cholesterol levels, improved heart and lung capacity, improved sleep, and better body composition. Everyone should speak with their doctor before starting or changing an exercise routine.  Biomechanical Limitations Clinical staff conducted group or individual video education with verbal and written material and guidebook.  Patient learns how biomechanical limitations can impact exercise and how we can mitigate and possibly overcome limitations to have an  impactful and balanced exercise routine.  Body Composition Clinical staff conducted group or individual video education with verbal and written material and guidebook.  Patient learns that body composition (ratio of muscle mass to fat mass) is a key component to assessing overall fitness, rather than body weight alone. Increased fat mass, especially  visceral belly fat, can put [place] at increased risk for metabolic syndrome, type 2 diabetes, heart disease, and even death. It is recommended to combine diet and exercise (cardiovascular and resistance training) to improve your body composition. Seek guidance from your physician and exercise physiologist before implementing an exercise routine.  Exercise Action Plan Clinical staff conducted group or individual video education with verbal and written material and guidebook.  Patient learns the recommended strategies to achieve and enjoy long-term exercise adherence, including variety, self-motivation, self-efficacy, and positive decision making. Benefits of exercise include fitness, good health, weight management, more energy, better sleep, less stress, and overall well-being.  Medical   Heart Disease Risk Reduction Clinical staff conducted group or individual video education with verbal and written material and guidebook.  Patient learns our heart is our most vital organ as it circulates oxygen, nutrients, white blood cells, and hormones throughout the entire body, and carries waste away. Data supports a plant-based eating plan like the Pritikin Program for its effectiveness in slowing progression of and reversing heart disease. The video provides a number of recommendations to address heart disease.   Metabolic Syndrome and Belly Fat  Clinical staff conducted group or individual video education with verbal and written material and guidebook.  Patient learns what metabolic syndrome is, how it leads to heart disease, and how one can reverse it and keep it  from coming back. You have metabolic syndrome if you have 3 of the following 5 criteria: abdominal obesity, high blood pressure, high triglycerides, low HDL cholesterol, and high blood sugar.  Hypertension and Heart Disease Clinical staff conducted group or individual video education with verbal and written material and guidebook.  Patient learns that high blood pressure, or hypertension, is very common in the [place]. Hypertension is largely due to excessive salt intake, but other important risk factors include being overweight, physical inactivity, drinking too much alcohol, smoking, and not eating enough potassium from fruits and vegetables. High blood pressure is a leading risk factor for heart attack, stroke, congestive heart failure, dementia, kidney failure, and premature death. Long-term effects of excessive salt intake include stiffening of the arteries and thickening of heart muscle and organ damage. Recommendations include ways to reduce hypertension and the risk of heart disease.  Diseases of Our Time - Focusing on Diabetes Clinical staff conducted group or individual video education with verbal and written material and guidebook.  Patient learns why the best way to stop diseases of our time is prevention, through food and other lifestyle changes. Medicine (such as prescription pills and surgeries) is often only a Band-Aid on the problem, not a long-term solution. Most common diseases of our time include obesity, type 2 diabetes, hypertension, heart disease, and cancer. The Pritikin Program is recommended and has been proven to help reduce, reverse, and/or prevent the damaging effects of metabolic syndrome.  Nutrition   Overview of the Pritikin Eating Plan  Clinical staff conducted group or individual video education with verbal and written material and guidebook.  Patient learns about the Pritikin Eating Plan for disease risk reduction. The Pritikin Eating Plan emphasizes a wide  variety of unrefined, minimally-processed carbohydrates, like fruits, vegetables, whole grains, and legumes. Go, Caution, and Stop food choices are explained. Plant-based and lean animal proteins are emphasized. Rationale provided for low sodium intake for blood pressure control, low added sugars for blood sugar stabilization, and low added fats and oils for coronary artery disease risk reduction and weight management.  Calorie Density  Clinical staff conducted  group or individual video education with verbal and written material and guidebook.  Patient learns about calorie density and how it impacts the Pritikin Eating Plan. Knowing the characteristics of the food you choose will help you decide whether those foods will lead to weight gain or weight loss, and whether you want to consume more or less of them. Weight loss is usually a side effect of the Pritikin Eating Plan because of its focus on low calorie-dense foods.  Label Reading  Clinical staff conducted group or individual video education with verbal and written material and guidebook.  Patient learns about the Pritikin recommended label reading guidelines and corresponding recommendations regarding calorie density, added sugars, sodium content, and whole grains.  Dining Out - Part 1  Clinical staff conducted group or individual video education with verbal and written material and guidebook.  Patient learns that restaurant meals can be sabotaging because they can be so high in calories, fat, sodium, and/or sugar. Patient learns recommended strategies on how to positively address this and avoid unhealthy pitfalls.  Facts on Fats  Clinical staff conducted group or individual video education with verbal and written material and guidebook.  Patient learns that lifestyle modifications can be just as effective, if not more so, as many medications for lowering your risk of heart disease. A Pritikin lifestyle can help to reduce your risk of  inflammation and atherosclerosis (cholesterol build-up, or plaque, in the artery walls). Lifestyle interventions such as dietary choices and physical activity address the cause of atherosclerosis. A review of the types of fats and their impact on blood cholesterol levels, along with dietary recommendations to reduce fat intake is also included.  Nutrition Action Plan  Clinical staff conducted group or individual video education with verbal and written material and guidebook.  Patient learns how to incorporate Pritikin recommendations into their lifestyle. Recommendations include planning and keeping personal health goals in mind as an important part of their success.  Healthy Mind-Set    Healthy Minds, Bodies, Hearts  Clinical staff conducted group or individual video education with verbal and written material and guidebook.  Patient learns how to identify when they are stressed. Video will discuss the impact of that stress, as well as the many benefits of stress management. Patient will also be introduced to stress management techniques. The way we think, act, and feel has an impact on our hearts.  How Our Thoughts Can Heal Our Hearts  Clinical staff conducted group or individual video education with verbal and written material and guidebook.  Patient learns that negative thoughts can cause depression and anxiety. This can result in negative lifestyle behavior and serious health problems. Cognitive behavioral therapy is an effective method to help control our thoughts in order to change and improve our emotional outlook.  Additional Videos:  Exercise    Improving Performance  Clinical staff conducted group or individual video education with verbal and written material and guidebook.  Patient learns to use a non-linear approach by alternating intensity levels and lengths of time spent exercising to help burn more calories and lose more body fat. Cardiovascular exercise helps improve heart health,  metabolism, hormonal balance, blood sugar control, and recovery from fatigue. Resistance training improves strength, endurance, balance, coordination, reaction time, metabolism, and muscle mass. Flexibility exercise improves circulation, posture, and balance. Seek guidance from your physician and exercise physiologist before implementing an exercise routine and learn your capabilities and proper form for all exercise.  Introduction to Yoga  Clinical staff conducted group or individual  video education with verbal and written material and guidebook.  Patient learns about yoga, a discipline of the coming together of mind, breath, and body. The benefits of yoga include improved flexibility, improved range of motion, better posture and core strength, increased lung function, weight loss, and positive self-image. Yoga's heart health benefits include lowered blood pressure, healthier heart rate, decreased cholesterol and triglyceride levels, improved immune function, and reduced stress. Seek guidance from your physician and exercise physiologist before implementing an exercise routine and learn your capabilities and proper form for all exercise.  Medical   Aging: Enhancing Your Quality of Life  Clinical staff conducted group or individual video education with verbal and written material and guidebook.  Patient learns key strategies and recommendations to stay in good physical health and enhance quality of life, such as prevention strategies, having an advocate, securing a Health Care Proxy and Power of Attorney, and keeping a list of medications and system for tracking them. It also discusses how to avoid risk for bone loss.  Biology of Weight Control  Clinical staff conducted group or individual video education with verbal and written material and guidebook.  Patient learns that weight gain occurs because we consume more calories than we burn (eating more, moving less). Even if your body weight is normal, you  may have higher ratios of fat compared to muscle mass. Too much body fat puts you at increased risk for cardiovascular disease, heart attack, stroke, type 2 diabetes, and obesity-related cancers. In addition to exercise, following the Pritikin Eating Plan can help reduce your risk.  Decoding Lab Results  Clinical staff conducted group or individual video education with verbal and written material and guidebook.  Patient learns that lab test reflects one measurement whose values change over time and are influenced by many factors, including medication, stress, sleep, exercise, food, hydration, pre-existing medical conditions, and more. It is recommended to use the knowledge from this video to become more involved with your lab results and evaluate your numbers to speak with your doctor.   Diseases of Our Time - Overview  Clinical staff conducted group or individual video education with verbal and written material and guidebook.  Patient learns that according to the CDC, 50% to 70% of chronic diseases (such as obesity, type 2 diabetes, elevated lipids, hypertension, and heart disease) are avoidable through lifestyle improvements including healthier food choices, listening to satiety cues, and increased physical activity.  Sleep Disorders Clinical staff conducted group or individual video education with verbal and written material and guidebook.  Patient learns how good quality and duration of sleep are important to overall health and well-being. Patient also learns about sleep disorders and how they impact health along with recommendations to address them, including discussing with a physician.  Nutrition  Dining Out - Part 2 Clinical staff conducted group or individual video education with verbal and written material and guidebook.  Patient learns how to plan ahead and communicate in order to maximize their dining experience in a healthy and nutritious manner. Included are recommended food choices  based on the type of restaurant the patient is visiting.   Fueling a Banker conducted group or individual video education with verbal and written material and guidebook.  There is a strong connection between our food choices and our health. Diseases like obesity and type 2 diabetes are very prevalent and are in large-part due to lifestyle choices. The Pritikin Eating Plan provides plenty of food and hunger-curbing satisfaction. It is easy  to follow, affordable, and helps reduce health risks.  Menu Workshop  Clinical staff conducted group or individual video education with verbal and written material and guidebook.  Patient learns that restaurant meals can sabotage health goals because they are often packed with calories, fat, sodium, and sugar. Recommendations include strategies to plan ahead and to communicate with the manager, chef, or server to help order a healthier meal.  Planning Your Eating Strategy  Clinical staff conducted group or individual video education with verbal and written material and guidebook.  Patient learns about the Pritikin Eating Plan and its benefit of reducing the risk of disease. The Pritikin Eating Plan does not focus on calories. Instead, it emphasizes high-quality, nutrient-rich foods. By knowing the characteristics of the foods, we choose, we can determine their calorie density and make informed decisions.  Targeting Your Nutrition Priorities  Clinical staff conducted group or individual video education with verbal and written material and guidebook.  Patient learns that lifestyle habits have a tremendous impact on disease risk and progression. This video provides eating and physical activity recommendations based on your personal health goals, such as reducing LDL cholesterol, losing weight, preventing or controlling type 2 diabetes, and reducing high blood pressure.  Vitamins and Minerals  Clinical staff conducted group or individual video  education with verbal and written material and guidebook.  Patient learns different ways to obtain key vitamins and minerals, including through a recommended healthy diet. It is important to discuss all supplements you take with your doctor.   Healthy Mind-Set    Smoking Cessation  Clinical staff conducted group or individual video education with verbal and written material and guidebook.  Patient learns that cigarette smoking and tobacco addiction pose a serious health risk which affects millions of people. Stopping smoking will significantly reduce the risk of heart disease, lung disease, and many forms of cancer. Recommended strategies for quitting are covered, including working with your doctor to develop a successful plan.  Culinary   Becoming a Set designer conducted group or individual video education with verbal and written material and guidebook.  Patient learns that cooking at home can be healthy, cost-effective, quick, and puts them in control. Keys to cooking healthy recipes will include looking at your recipe, assessing your equipment needs, planning ahead, making it simple, choosing cost-effective seasonal ingredients, and limiting the use of added fats, salts, and sugars.  Cooking - Breakfast and Snacks  Clinical staff conducted group or individual video education with verbal and written material and guidebook.  Patient learns how important breakfast is to satiety and nutrition through the entire day. Recommendations include key foods to eat during breakfast to help stabilize blood sugar levels and to prevent overeating at meals later in the day. Planning ahead is also a key component.  Cooking - Educational psychologist conducted group or individual video education with verbal and written material and guidebook.  Patient learns eating strategies to improve overall health, including an approach to cook more at home. Recommendations include thinking of  animal protein as a side on your plate rather than center stage and focusing instead on lower calorie dense options like vegetables, fruits, whole grains, and plant-based proteins, such as beans. Making sauces in large quantities to freeze for later and leaving the skin on your vegetables are also recommended to maximize your experience.  Cooking - Healthy Salads and Dressing Clinical staff conducted group or individual video education with verbal and written material and guidebook.  Patient learns that vegetables, fruits, whole grains, and legumes are the foundations of the Pritikin Eating Plan. Recommendations include how to incorporate each of these in flavorful and healthy salads, and how to create homemade salad dressings. Proper handling of ingredients is also covered. Cooking - Soups and [employer] - Soups and Desserts Clinical staff conducted group or individual video education with verbal and written material and guidebook.  Patient learns that Pritikin soups and desserts make for easy, nutritious, and delicious snacks and meal components that are low in sodium, fat, sugar, and calorie density, while high in vitamins, minerals, and filling fiber. Recommendations include simple and healthy ideas for soups and desserts.   Overview     The Pritikin Solution Program Overview Clinical staff conducted group or individual video education with verbal and written material and guidebook.  Patient learns that the results of the Pritikin Program have been documented in more than 100 articles published in peer-reviewed journals, and the benefits include reducing risk factors for (and, in some cases, even reversing) high cholesterol, high blood pressure, type 2 diabetes, obesity, and more! An overview of the three key pillars of the Pritikin Program will be covered: eating well, doing regular exercise, and having a healthy mind-set.  WORKSHOPS  Exercise: Exercise Basics: Building Your Action  Plan Clinical staff led group instruction and group discussion with PowerPoint presentation and patient guidebook. To enhance the learning environment the use of posters, models and videos may be added. At the conclusion of this workshop, patients will comprehend the difference between physical activity and exercise, as well as the benefits of incorporating both, into their routine. Patients will understand the FITT (Frequency, Intensity, Time, and Type) principle and how to use it to build an exercise action plan. In addition, safety concerns and other considerations for exercise and cardiac rehab will be addressed by the presenter. The purpose of this lesson is to promote a comprehensive and effective weekly exercise routine in order to improve patients' overall level of fitness.   Managing Heart Disease: Your Path to a Healthier Heart Clinical staff led group instruction and group discussion with PowerPoint presentation and patient guidebook. To enhance the learning environment the use of posters, models and videos may be added.At the conclusion of this workshop, patients will understand the anatomy and physiology of the heart. Additionally, they will understand how Pritikin's three pillars impact the risk factors, the progression, and the management of heart disease.  The purpose of this lesson is to provide a high-level overview of the heart, heart disease, and how the Pritikin lifestyle positively impacts risk factors.  Exercise Biomechanics Clinical staff led group instruction and group discussion with PowerPoint presentation and patient guidebook. To enhance the learning environment the use of posters, models and videos may be added. Patients will learn how the structural parts of their bodies function and how these functions impact their daily activities, movement, and exercise. Patients will learn how to promote a neutral spine, learn how to manage pain, and identify ways to improve  their physical movement in order to promote healthy living. The purpose of this lesson is to expose patients to common physical limitations that impact physical activity. Participants will learn practical ways to adapt and manage aches and pains, and to minimize their effect on regular exercise. Patients will learn how to maintain good posture while sitting, walking, and lifting.  Balance Training and Fall Prevention  Clinical staff led group instruction and group discussion with PowerPoint presentation and patient guidebook.  To enhance the learning environment the use of posters, models and videos may be added. At the conclusion of this workshop, patients will understand the importance of their sensorimotor skills (vision, proprioception, and the vestibular system) in maintaining their ability to balance as they age. Patients will apply a variety of balancing exercises that are appropriate for their current level of function. Patients will understand the common causes for poor balance, possible solutions to these problems, and ways to modify their physical environment in order to minimize their fall risk. The purpose of this lesson is to teach patients about the importance of maintaining balance as they age and ways to minimize their risk of falling.  WORKSHOPS   Nutrition:  Fueling a Ship broker led group instruction and group discussion with PowerPoint presentation and patient guidebook. To enhance the learning environment the use of posters, models and videos may be added. Patients will review the foundational principles of the Pritikin Eating Plan and understand what constitutes a serving size in each of the food groups. Patients will also learn Pritikin-friendly foods that are better choices when away from home and review make-ahead meal and snack options. Calorie density will be reviewed and applied to three nutrition priorities: weight maintenance, weight loss, and weight  gain. The purpose of this lesson is to reinforce (in a group setting) the key concepts around what patients are recommended to eat and how to apply these guidelines when away from home by planning and selecting Pritikin-friendly options. Patients will understand how calorie density may be adjusted for different weight management goals.  Mindful Eating  Clinical staff led group instruction and group discussion with PowerPoint presentation and patient guidebook. To enhance the learning environment the use of posters, models and videos may be added. Patients will briefly review the concepts of the Pritikin Eating Plan and the importance of low-calorie dense foods. The concept of mindful eating will be introduced as well as the importance of paying attention to internal hunger signals. Triggers for non-hunger eating and techniques for dealing with triggers will be explored. The purpose of this lesson is to provide patients with the opportunity to review the basic principles of the Pritikin Eating Plan, discuss the value of eating mindfully and how to measure internal cues of hunger and fullness using the Hunger Scale. Patients will also discuss reasons for non-hunger eating and learn strategies to use for controlling emotional eating.  Targeting Your Nutrition Priorities Clinical staff led group instruction and group discussion with PowerPoint presentation and patient guidebook. To enhance the learning environment the use of posters, models and videos may be added. Patients will learn how to determine their genetic susceptibility to disease by reviewing their family history. Patients will gain insight into the importance of diet as part of an overall healthy lifestyle in mitigating the impact of genetics and other environmental insults. The purpose of this lesson is to provide patients with the opportunity to assess their personal nutrition priorities by looking at their family history, their own health history  and current risk factors. Patients will also be able to discuss ways of prioritizing and modifying the Pritikin Eating Plan for their highest risk areas  Menu  Clinical staff led group instruction and group discussion with PowerPoint presentation and patient guidebook. To enhance the learning environment the use of posters, models and videos may be added. Using menus brought in from [employer], or printed from [employer], patients will apply the Pritikin dining out guidelines that were presented in  the [employer] video. Patients will also be able to practice these guidelines in a variety of provided scenarios. The purpose of this lesson is to provide patients with the opportunity to practice hands-on learning of the Pritikin Dining Out guidelines with actual menus and practice scenarios.  Label Reading Clinical staff led group instruction and group discussion with PowerPoint presentation and patient guidebook. To enhance the learning environment the use of posters, models and videos may be added. Patients will review and discuss the Pritikin label reading guidelines presented in Pritikin's Label Reading Educational series video. Using fool labels brought in from local grocery stores and markets, patients will apply the label reading guidelines and determine if the packaged food meet the Pritikin guidelines. The purpose of this lesson is to provide patients with the opportunity to review, discuss, and practice hands-on learning of the Pritikin Label Reading guidelines with actual packaged food labels. Cooking School  Pritikin's [employer] are designed to teach patients ways to prepare quick, simple, and affordable recipes at home. The importance of nutrition's role in chronic disease risk reduction is reflected in its emphasis in the overall Pritikin program. By learning how to prepare essential core Pritikin Eating Plan recipes, patients will increase control over  what they eat; be able to customize the flavor of foods without the use of added salt, sugar, or fat; and improve the quality of the food they consume. By learning a set of core recipes which are easily assembled, quickly prepared, and affordable, patients are more likely to prepare more healthy foods at home. These workshops focus on convenient breakfasts, simple entres, side dishes, and desserts which can be prepared with minimal effort and are consistent with nutrition recommendations for cardiovascular risk reduction. Cooking [employer] are taught by a Armed forces logistics/support/administrative officer (RD) who has been trained by the [employer]. The chef or RD has a clear understanding of the importance of minimizing - if not completely eliminating - added fat, sugar, and sodium in recipes. Throughout the series of Cooking School Workshop sessions, patients will learn about healthy ingredients and efficient methods of cooking to build confidence in their capability to prepare    Cooking School weekly topics:  Adding Flavor- Sodium-Free  Fast and Healthy Breakfasts  Powerhouse Plant-Based Proteins  Satisfying Salads and Dressings  Simple Sides and Sauces  International Cuisine-Spotlight on the [employer] Zones  Delicious Desserts  Savory Soups  [employer] - Meals in a Astronomer Appetizers and Snacks  Comforting Weekend Breakfasts  One-Pot Wonders   Fast Evening Meals  Landscape architect Your Pritikin Plate  WORKSHOPS   Healthy Mindset (Psychosocial):  Focused Goals, Sustainable Changes Clinical staff led group instruction and group discussion with PowerPoint presentation and patient guidebook. To enhance the learning environment the use of posters, models and videos may be added. Patients will be able to apply effective goal setting strategies to establish at least one personal goal, and then take consistent, meaningful action toward that goal. They will learn to  identify common barriers to achieving personal goals and develop strategies to overcome them. Patients will also gain an understanding of how our mind-set can impact our ability to achieve goals and the importance of cultivating a positive and growth-oriented mind-set. The purpose of this lesson is to provide patients with a deeper understanding of how to set and achieve personal goals, as well as the tools and strategies needed to overcome common obstacles which may arise along  the way.  From Head to Heart: The Power of a Healthy Outlook  Clinical staff led group instruction and group discussion with PowerPoint presentation and patient guidebook. To enhance the learning environment the use of posters, models and videos may be added. Patients will be able to recognize and describe the impact of emotions and mood on physical health. They will discover the importance of self-care and explore self-care practices which may work for them. Patients will also learn how to utilize the 4 C's to cultivate a healthier outlook and better manage stress and challenges. The purpose of this lesson is to demonstrate to patients how a healthy outlook is an essential part of maintaining good health, especially as they continue their cardiac rehab journey.  Healthy Sleep for a Healthy Heart Clinical staff led group instruction and group discussion with PowerPoint presentation and patient guidebook. To enhance the learning environment the use of posters, models and videos may be added. At the conclusion of this workshop, patients will be able to demonstrate knowledge of the importance of sleep to overall health, well-being, and quality of life. They will understand the symptoms of, and treatments for, common sleep disorders. Patients will also be able to identify daytime and nighttime behaviors which impact sleep, and they will be able to apply these tools to help manage sleep-related challenges. The purpose of this lesson is to  provide patients with a general overview of sleep and outline the importance of quality sleep. Patients will learn about a few of the most common sleep disorders. Patients will also be introduced to the concept of "sleep hygiene," and discover ways to self-manage certain sleeping problems through simple daily behavior changes. Finally, the workshop will motivate patients by clarifying the links between quality sleep and their goals of heart-healthy living.   Recognizing and Reducing Stress Clinical staff led group instruction and group discussion with PowerPoint presentation and patient guidebook. To enhance the learning environment the use of posters, models and videos may be added. At the conclusion of this workshop, patients will be able to understand the types of stress reactions, differentiate between acute and chronic stress, and recognize the impact that chronic stress has on their health. They will also be able to apply different coping mechanisms, such as reframing negative self-talk. Patients will have the opportunity to practice a variety of stress management techniques, such as deep abdominal breathing, progressive muscle relaxation, and/or guided imagery.  The purpose of this lesson is to educate patients on the role of stress in their lives and to provide healthy techniques for coping with it.  Learning Barriers/Preferences:  Learning Barriers/Preferences - 08/11/23 1216       Learning Barriers/Preferences   Learning Barriers Sight    Learning Preferences Audio;Computer/Internet;Pictoral;Verbal Instruction;Video             Education Topics:  Knowledge Questionnaire Score:  Knowledge Questionnaire Score - 08/11/23 1259       Knowledge Questionnaire Score   Pre Score 21/24             Core Components/Risk Factors/Patient Goals at Admission:  Personal Goals and Risk Factors at Admission - 08/11/23 1300       Core Components/Risk Factors/Patient Goals on Admission     Weight Management Yes;Obesity;Weight Loss    Intervention Weight Management: Develop a combined nutrition and exercise program designed to reach desired caloric intake, while maintaining appropriate intake of nutrient and fiber, sodium and fats, and appropriate energy expenditure required for the weight goal.;Weight Management: Provide  education and appropriate resources to help participant work on and attain dietary goals.;Weight Management/Obesity: Establish reasonable short term and long term weight goals.;Obesity: Provide education and appropriate resources to help participant work on and attain dietary goals.    Admit Weight 255 lb 4.7 oz (115.8 kg)    Expected Outcomes Short Term: Continue to assess and modify interventions until short term weight is achieved;Long Term: Adherence to nutrition and physical activity/exercise program aimed toward attainment of established weight goal;Weight Loss: Understanding of general recommendations for a balanced deficit meal plan, which promotes 1-2 lb weight loss per week and includes a negative energy balance of [phone] kcal/d;Understanding recommendations for meals to include 15-35% energy as protein, 25-35% energy from fat, 35-60% energy from carbohydrates, less than 200mg  of dietary cholesterol, 20-35 gm of total fiber daily;Understanding of distribution of calorie intake throughout the day with the consumption of 4-5 meals/snacks    Improve shortness of breath with ADL's Yes    Intervention Provide education, individualized exercise plan and daily activity instruction to help decrease symptoms of SOB with activities of daily living.    Expected Outcomes Short Term: Improve cardiorespiratory fitness to achieve a reduction of symptoms when performing ADLs;Long Term: Be able to perform more ADLs without symptoms or delay the onset of symptoms    Intervention Provide education about signs/symptoms and action to take for hypo/hyperglycemia.;Provide education about  proper nutrition, including hydration, and aerobic/resistive exercise prescription along with prescribed medications to achieve blood glucose in normal ranges: Fasting glucose 65-99 mg/dL    Expected Outcomes Long Term: Attainment of HbA1C < 7%.;Short Term: Participant verbalizes understanding of the signs/symptoms and immediate care of hyper/hypoglycemia, proper foot care and importance of medication, aerobic/resistive exercise and nutrition plan for blood glucose control.    Intervention Provide a combined exercise and nutrition program that is supplemented with education, support and counseling about heart failure. Directed toward relieving symptoms such as shortness of breath, decreased exercise tolerance, and extremity edema.    Expected Outcomes Improve functional capacity of life;Short term: Attendance in program 2-3 days a week with increased exercise capacity. Reported lower sodium intake. Reported increased fruit and vegetable intake. Reports medication compliance.;Short term: Daily weights obtained and reported for increase. Utilizing diuretic protocols set by physician.;Long term: Adoption of self-care skills and reduction of barriers for early signs and symptoms recognition and intervention leading to self-care maintenance.    Hypertension Yes    Intervention Provide education on lifestyle modifcations including regular physical activity/exercise, weight management, moderate sodium restriction and increased consumption of fresh fruit, vegetables, and low fat dairy, alcohol moderation, and smoking cessation.;Monitor prescription use compliance.    Expected Outcomes Short Term: Continued assessment and intervention until BP is < 140/9mm HG in hypertensive participants. < 130/94mm HG in hypertensive participants with diabetes, heart failure or chronic kidney disease.;Long Term: Maintenance of blood pressure at goal levels.    Lipids Yes    Intervention Provide education and support for participant  on nutrition & aerobic/resistive exercise along with prescribed medications to achieve LDL 70mg , HDL >40mg .    Expected Outcomes Short Term: Participant states understanding of desired cholesterol values and is compliant with medications prescribed. Participant is following exercise prescription and nutrition guidelines.;Long Term: Cholesterol controlled with medications as prescribed, with individualized exercise RX and with personalized nutrition plan. Value goals: LDL < 70mg , HDL > 40 mg.    Stress Yes    Intervention Offer individual and/or small group education and counseling on adjustment to heart disease, stress management  and health-related lifestyle change. Teach and support self-help strategies.    Expected Outcomes Short Term: Participant demonstrates changes in health-related behavior, relaxation and other stress management skills, ability to obtain effective social support, and compliance with psychotropic medications if prescribed.;Long Term: Emotional wellbeing is indicated by absence of clinically significant psychosocial distress or social isolation.             Core Components/Risk Factors/Patient Goals Review:   Goals and Risk Factor Review     Row Name 08/20/23 1413 09/09/23 0846           Core Components/Risk Factors/Patient Goals Review   Personal Goals Review Weight Management/Obesity;Diabetes;Improve shortness of breath with ADL's;Hypertension;Lipids;Stress Weight Management/Obesity;Diabetes;Improve shortness of breath with ADL's;Hypertension;Lipids;Stress      Review Al started cardiac rehab on 08/20/23. Al did well with exercise for his fitness level. Al is deconditioned Al has done well with exercise at cardiac rehab.  Vital signs have been stable. Al reported having indigestion 09/07/23. 12 lead ordered and obtained. Sent to Ed per onsite provider. Exercise is currently on hold      Expected Outcomes Al will continue to participate in cardiac rehab for exercise,  nutriiton and lifestyle modifications Al will continue to participate in cardiac rehab for exercise, nutriiton and lifestyle modifications               Core Components/Risk Factors/Patient Goals at Discharge (Final Review):   Goals and Risk Factor Review - 09/09/23 0846       Core Components/Risk Factors/Patient Goals Review   Personal Goals Review Weight Management/Obesity;Diabetes;Improve shortness of breath with ADL's;Hypertension;Lipids;Stress    Review Al has done well with exercise at cardiac rehab.  Vital signs have been stable. Al reported having indigestion 09/07/23. 12 lead ordered and obtained. Sent to Ed per onsite provider. Exercise is currently on hold    Expected Outcomes Al will continue to participate in cardiac rehab for exercise, nutriiton and lifestyle modifications             ITP Comments:  ITP Comments     Row Name 07/02/23 1332 08/20/23 1352 09/09/23 0832       ITP Comments Dr. Armanda Magic medical director. Introduction to pritikin education program/ intensive cardiac rehab. Initial education packet reviewed with patient. 30 Day ITP Review. Al started cardiac rehab on 08/19/23 and did well with exercise. 30 Day ITP Review. Exercise is currently on hold due to recent ED visit on 09/07/23              Comments: See ITP comments.Thayer Headings RN BSN

## 2023-09-11 ENCOUNTER — Encounter (HOSPITAL_COMMUNITY): Payer: Medicare Other

## 2023-09-11 ENCOUNTER — Ambulatory Visit (HOSPITAL_COMMUNITY): Payer: Medicare Other

## 2023-09-14 ENCOUNTER — Encounter (HOSPITAL_COMMUNITY): Payer: Medicare Other

## 2023-09-14 ENCOUNTER — Ambulatory Visit (HOSPITAL_COMMUNITY): Payer: Medicare Other

## 2023-09-15 NOTE — Progress Notes (Unsigned)
Cardiology Office Note   Date:  09/16/2023  ID:  Bill, [name] 1946-09-09, MRN [id number] PCP:  Bill, [name] G, MD [facility] Cardiologist: Bill Red, MD  Reason for visit: Chest pain   History of Present Illness    [name] is a 77 y.o. male with a hx of atrial fibrillation, myocardial infarction, coronary artery disease, hypertension, hyperlipidemia, dyspnea, sleep apnea, chronic kidney disease, diabetes mellitus, cancer.  In 05/2023, pt was seen for SOB in the clinic.  He was sent to the hospital and received diuresis.  LHC June 09, 2023 showed patent LIMA to LAD vein graft to PDA. Vein graft to ramus occluded. He had severe disease of the ostial LAD and had an occluded proximal left circumflex and known occluded ostial right coronary artery, with plans to medically manage.   He last saw Dr. Thayer [name] from August 26, 2023.  He was starting cardiac rehab without chest pain.  Patient noted elevated blood pressure at home.  Jardiance was discontinued secondary to frequent UTIs.  Therefore, Dr. Cristal [name] plan to talk to his PCP to discuss GLP-1 agonists .  He went to the ED September 07, 2023.  Patient had chest pain at cardiac rehab.  High-sensitivity troponin 16 --> 21.  Chest pain resolved, pt requested discharge. ?Imdur  Today, he comes in with his wife.  He states he went to the ED after having some shortness of breath on the likely machine.  He thinks he just overdid it.  Patient denies significant chest pain following his heart catheterization in January 2024.  Patient brings his blood pressure log.  Blood pressure log shows average blood pressure 150s over 90s, heart rates 50s to 60s and weight stable around 252 pounds.  There is previous notes that patient's blood pressure cuff runs higher than office cuffs, around 9 points higher.  When asked about lightheadedness, patient gets some lightheadedness when his glucoses are low.  No syncope.   No palpitations.  Compliant with CPAP.  No significant LE edema.   Objective / Physical Exam   EKG today: Sinus bradycardia with marked sinus arrhythmia and fusion complexes.  Vital signs:  BP 138/72 (BP Location: Left Arm, Patient Position: Sitting, Cuff Size: Normal)   Pulse 67   Ht 5\' 11"  (1.803 m)   Wt 258 lb (117 kg)   SpO2 98%   BMI 35.98 kg/m     GEN: No acute distress NECK: No carotid bruits CARDIAC: RRR, no murmurs RESPIRATORY:  Clear to auscultation without rales, wheezing or rhonchi  EXTREMITIES: Trace edema  Assessment and Plan   Coronary artery disease, no angina Precordial pain, resolved -NSTEMI with LHC June 09, 2023 showing patent LIMA to LAD vein graft to PDA. Vein graft to ramus occluded. He had severe disease of the ostial LAD and had an occluded proximal left circumflex and known occluded ostial right coronary artery, with plans to medically manage. EF 50-55% -Patient wishes to continue Jardiance for now.  He has had 1 UTI since starting Jardiance in June, this was treated with 2 rounds of antibiotics.  If he has another UTI, I recommend stopping Jardiance. -If patient stops Jardiance, recommend adding a GLP-1 agonists through his PCP - options with cardiac protection: Liraglutide (Victoza), semaglutide (Ozempic/Wegovy), and dulaglutide (Trulicity) -Continue antianginals including amlodipine 10 mg daily and metoprolol tartrate 12.5 mg twice daily.  Unable to titrate metoprolol secondary to sinus bradycardia.  If he did have future angina, could consider adding Imdur. -Continue aspirin  and statin therapy. -Safe to continue cardiac rehab  Paroxysmal atrial fibrillation, asymptomatic -EKG shows sinus bradycardia with sinus arrhythmia. -Continue Eliquis for stroke prevention.  Hypertension, BP variable -I think there is enough evidence of elevated blood pressures to trial hydralazine 25 mg twice daily.  Patient does have chronic kidney disease therefore weary of  starting ACE/ARB or HCTZ.  There is a low severity allergy listed with lisinopril, he does not remember what it was. -Advised to buy larger blood pressure cuff to see if this gives him more accurate readings at home. -Continue blood pressure monitoring at office visits and cardiac rehab.  Hyperlipidemia -LDL 47 in June 2024.  Continue Lipitor 80 mg daily. -Recommend cholesterol lowering diets - Mediterranean diet, DASH diet, vegetarian diet, low-carbohydrate diet and avoidance of trans fats.  Discussed healthier choice substitutes.  Nuts, high-fiber foods, and fiber supplements may also improve lipids.    Disposition - Follow-up in January with Dr. Cristal [name] as scheduled.   Signed, Bill Kettle, PA-C  09/16/2023 [facility]

## 2023-09-16 ENCOUNTER — Encounter (HOSPITAL_COMMUNITY): Payer: Medicare Other

## 2023-09-16 ENCOUNTER — Encounter: Payer: Self-pay | Admitting: Physician Assistant

## 2023-09-16 ENCOUNTER — Other Ambulatory Visit: Payer: Self-pay | Admitting: Physician Assistant

## 2023-09-16 ENCOUNTER — Ambulatory Visit: Payer: Medicare Other | Attending: Physician Assistant | Admitting: Physician Assistant

## 2023-09-16 ENCOUNTER — Ambulatory Visit (HOSPITAL_COMMUNITY): Payer: Medicare Other

## 2023-09-16 VITALS — BP 138/72 | HR 67 | Ht 71.0 in | Wt 258.0 lb

## 2023-09-16 DIAGNOSIS — I2510 Atherosclerotic heart disease of native coronary artery without angina pectoris: Secondary | ICD-10-CM | POA: Diagnosis not present

## 2023-09-16 DIAGNOSIS — I10 Essential (primary) hypertension: Secondary | ICD-10-CM | POA: Diagnosis not present

## 2023-09-16 DIAGNOSIS — R072 Precordial pain: Secondary | ICD-10-CM | POA: Insufficient documentation

## 2023-09-16 MED ORDER — HYDRALAZINE HCL 25 MG PO TABS: 25.0000 mg | ORAL_TABLET | Freq: Two times a day (BID) | ORAL | 3 refills | Status: DC

## 2023-09-16 MED ORDER — HYDRALAZINE HCL 25 MG PO TABS: 25.0000 mg | ORAL_TABLET | Freq: Two times a day (BID) | ORAL | 0 refills | Status: DC

## 2023-09-16 NOTE — Patient Instructions (Signed)
Medication Instructions:  Start Hydralazine 25 mg ( Take 1 Tablet Twice Daily). *If you need a refill on your cardiac medications before your next appointment, please call your pharmacy*   Lab Work: No Labs If you have labs (blood work) drawn today and your tests are completely normal, you will receive your results only by: MyChart Message (if you have MyChart) OR A paper copy in the mail If you have any lab test that is abnormal or we need to change your treatment, we will call you to review the results.   Testing/Procedures: No Testing   Follow-Up: At [facility], you and your health needs are our priority.  As part of our continuing mission to provide you with exceptional heart care, we have created designated Provider Care Teams.  These Care Teams include your primary Cardiologist (physician) and Advanced Practice Providers (APPs -  Physician Assistants and Nurse Practitioners) who all work together to provide you with the care you need, when you need it.  We recommend signing up for the patient portal called "MyChart".  Sign up information is provided on this After Visit Summary.  MyChart is used to connect with patients for Virtual Visits (Telemedicine).  Patients are able to view lab/test results, encounter notes, upcoming appointments, etc.  Non-urgent messages can be sent to your provider as well.   To learn more about what you can do with MyChart, go to [email or web address].    Your next appointment:   Keep Scheduled Appointment  Provider:   Jodelle Red, MD   Other Instructions Recommend to purchase Large Blood Pressure Cuff.  Call of if UTI is recurring. Call office for Systolic Blood Pressure Below 409 or Above 150.

## 2023-09-18 ENCOUNTER — Encounter (HOSPITAL_COMMUNITY)
Admission: RE | Admit: 2023-09-18 | Discharge: 2023-09-18 | Disposition: A | Discharge status: Home / Self Care | Payer: Medicare Other | Source: Ambulatory Visit | Attending: Cardiology | Admitting: Cardiology

## 2023-09-18 ENCOUNTER — Ambulatory Visit (HOSPITAL_COMMUNITY): Payer: Medicare Other

## 2023-09-18 DIAGNOSIS — I214 Non-ST elevation (NSTEMI) myocardial infarction: Secondary | ICD-10-CM | POA: Insufficient documentation

## 2023-09-18 NOTE — Progress Notes (Signed)
Al returned to exercise per Cornell Barman PAC. Al exercised without difficulty.Thayer Headings RN BSN

## 2023-09-21 ENCOUNTER — Ambulatory Visit (HOSPITAL_COMMUNITY): Payer: Medicare Other

## 2023-09-21 ENCOUNTER — Encounter (HOSPITAL_COMMUNITY)
Admission: RE | Admit: 2023-09-21 | Discharge: 2023-09-21 | Disposition: A | Discharge status: Home / Self Care | Payer: Medicare Other | Source: Ambulatory Visit | Attending: Cardiology

## 2023-09-21 DIAGNOSIS — I214 Non-ST elevation (NSTEMI) myocardial infarction: Secondary | ICD-10-CM | POA: Diagnosis not present

## 2023-09-23 ENCOUNTER — Ambulatory Visit (HOSPITAL_COMMUNITY): Payer: Medicare Other

## 2023-09-23 ENCOUNTER — Encounter (HOSPITAL_COMMUNITY)
Admission: RE | Admit: 2023-09-23 | Discharge: 2023-09-23 | Disposition: A | Discharge status: Home / Self Care | Payer: Medicare Other | Source: Ambulatory Visit | Attending: Cardiology | Admitting: Cardiology

## 2023-09-23 DIAGNOSIS — I214 Non-ST elevation (NSTEMI) myocardial infarction: Secondary | ICD-10-CM | POA: Diagnosis not present

## 2023-09-25 ENCOUNTER — Encounter (HOSPITAL_COMMUNITY)
Admission: RE | Admit: 2023-09-25 | Discharge: 2023-09-25 | Disposition: A | Discharge status: Home / Self Care | Payer: Medicare Other | Source: Ambulatory Visit | Attending: Cardiology | Admitting: Cardiology

## 2023-09-25 DIAGNOSIS — I214 Non-ST elevation (NSTEMI) myocardial infarction: Secondary | ICD-10-CM

## 2023-09-28 ENCOUNTER — Encounter (HOSPITAL_COMMUNITY)
Admission: RE | Admit: 2023-09-28 | Discharge: 2023-09-28 | Disposition: A | Discharge status: Home / Self Care | Payer: Medicare Other | Source: Ambulatory Visit | Attending: Cardiology

## 2023-09-28 DIAGNOSIS — I214 Non-ST elevation (NSTEMI) myocardial infarction: Secondary | ICD-10-CM | POA: Diagnosis not present

## 2023-09-30 ENCOUNTER — Encounter (HOSPITAL_COMMUNITY): Payer: Medicare Other

## 2023-10-01 ENCOUNTER — Telehealth: Payer: Self-pay

## 2023-10-01 ENCOUNTER — Ambulatory Visit: Payer: Medicare Other

## 2023-10-01 MED ORDER — HYDRALAZINE HCL 50 MG PO TABS: 50.0000 mg | ORAL_TABLET | Freq: Three times a day (TID) | ORAL | 3 refills | Status: DC

## 2023-10-01 NOTE — Telephone Encounter (Signed)
  Please tell [name] that we have opportunity to better control his blood pressure. Looking to get blood pressure less than 140/90. We started hydralazine 25 mg twice daily as last appointment. -->  I recommend titrating this to 50 mg 3 times daily.  Please send RX for 30 days, 3 refills. He can send [place] blood pressure readings in 2 weeks for reassessment.  Thank you!  Juanda Crumble PA-C   Gave the patient the information above. He verbalized understanding. Sent RX to preferred pharmacy. He will send in BP readings through MyChart.

## 2023-10-01 NOTE — Addendum Note (Signed)
Addended by: Scheryl Marten on: 10/01/2023 11:21 AM   Modules accepted: Orders

## 2023-10-01 NOTE — Telephone Encounter (Signed)
Called patient regarding change in medication . Left message or patient to call office.

## 2023-10-01 NOTE — Telephone Encounter (Signed)
Pt is returning call.

## 2023-10-02 ENCOUNTER — Encounter (HOSPITAL_COMMUNITY): Payer: Medicare Other

## 2023-10-02 ENCOUNTER — Inpatient Hospital Stay: Payer: Medicare Other | Attending: Nurse Practitioner

## 2023-10-05 ENCOUNTER — Encounter (HOSPITAL_COMMUNITY)
Admission: RE | Admit: 2023-10-05 | Discharge: 2023-10-05 | Disposition: A | Discharge status: Home / Self Care | Payer: Medicare Other | Source: Ambulatory Visit | Attending: Cardiology | Admitting: Cardiology

## 2023-10-05 DIAGNOSIS — I214 Non-ST elevation (NSTEMI) myocardial infarction: Secondary | ICD-10-CM | POA: Diagnosis not present

## 2023-10-06 NOTE — Progress Notes (Signed)
Cardiac Individual Treatment Plan  Patient Details  Name: [name] MRN: [id number] Date of Birth: July 01, 1946 Referring Provider:   Flowsheet Row INTENSIVE CARDIAC REHAB ORIENT from 08/11/2023 in [facility], Vascular, & [facility]  Referring Provider Jodelle Red, MD       Initial Encounter Date:  Flowsheet Row INTENSIVE CARDIAC REHAB ORIENT from 08/11/2023 in [facility] for Heart, Vascular, & Lung [facility]  Date 08/11/23       Visit Diagnosis: 06/08/23 NSTEMI (non-ST elevated myocardial infarction) [facility])  Patient's Home Medications on Admission:  Current Outpatient Medications:    acetaminophen (TYLENOL) 500 MG tablet, Take 1,000 mg by mouth in the morning, at noon, and at bedtime., Disp: , Rfl:    amLODipine (NORVASC) 10 MG tablet, Take 5 mg by mouth daily., Disp: , Rfl:    apixaban (ELIQUIS) 5 MG TABS tablet, Take 5 mg by mouth 2 (two) times daily., Disp: , Rfl:    aspirin EC 81 MG tablet, Take 1 tablet (81 mg total) by mouth daily then as directed by provider. Swallow whole., Disp: 90 tablet, Rfl: 3   atorvastatin (LIPITOR) 80 MG tablet, Take 40 mg by mouth at bedtime., Disp: , Rfl:    Calcium Carbonate Antacid (TUMS PO), Take 2 tablets by mouth as needed (reflux)., Disp: , Rfl:    calcium-vitamin D (OSCAL WITH D) 500-200 MG-UNIT tablet, Take 1 tablet by mouth 2 (two) times daily., Disp: , Rfl:    CRANBERRY PO, Take 1 tablet by mouth in the morning and at bedtime., Disp: , Rfl:    empagliflozin (JARDIANCE) 10 MG TABS tablet, Take 1 tablet (10 mg total) by mouth daily., Disp: 90 tablet, Rfl: 3   ferrous sulfate 325 (65 FE) MG tablet, Take 1 tablet (325 mg total) by mouth daily with breakfast., Disp: 90 tablet, Rfl: 2   FLUoxetine (PROZAC) 20 MG capsule, Take 40 mg by mouth daily., Disp: , Rfl:    furosemide (LASIX) 40 MG tablet, Take 1 tablet (40 mg total) by mouth daily., Disp: 90 tablet, Rfl: 3    glucose blood (ONETOUCH VERIO) test strip, Use to test blood sugars 1-2 times daily., Disp: 200 each, Rfl: 12   hydrALAZINE (APRESOLINE) 50 MG tablet, Take 1 tablet (50 mg total) by mouth 3 (three) times daily., Disp: 270 tablet, Rfl: 3   insulin aspart (NOVOLOG) 100 UNIT/ML injection, Inject 3-5 Units into the skin See admin instructions. Inject 3 units at lunch time and 5 units at dinner, Disp: , Rfl:    insulin glargine (LANTUS) 100 UNIT/ML injection, Inject 0.5 mLs (50 Units total) into the skin daily., Disp: 15 mL, Rfl: 3   metoprolol tartrate (LOPRESSOR) 25 MG tablet, TAKE 1/2 TABLET(12.5 MG) BY MOUTH TWICE DAILY, Disp: 90 tablet, Rfl: 3   Multiple Vitamin (MULTI-VITAMINS) TABS, Take 1 tablet by mouth daily., Disp: , Rfl:    nitroGLYCERIN (NITROSTAT) 0.3 MG SL tablet, Place 0.3 mg under the tongue every 5 (five) minutes as needed for chest pain., Disp: , Rfl:    pantoprazole (PROTONIX) 40 MG tablet, Take 1 tablet (40 mg total) by mouth daily., Disp: 90 tablet, Rfl: 3   Probiotic Product (PROBIOTIC ADVANCED PO), Take 1 capsule by mouth at bedtime., Disp: , Rfl:    tamsulosin (FLOMAX) 0.4 MG CAPS capsule, Take 0.8 mg by mouth at bedtime., Disp: , Rfl:    traZODone (DESYREL) 100 MG tablet, Take 100 mg by mouth at bedtime., Disp: ,  Rfl:    vitamin B-12 (CYANOCOBALAMIN) 1000 MCG tablet, Take 1,000 mcg by mouth daily., Disp: , Rfl:    Wheat Dextrin (BENEFIBER DRINK MIX PO), Take 1 Scoop by mouth at bedtime., Disp: , Rfl:   Past Medical History: Past Medical History:  Diagnosis Date   A-fib (HCC) 03/04/2021   Anemia    Arthritis    Cancer (HCC)    prostate   Chronic kidney disease    blood in urine    Constipation    Coronary artery disease    Depression    Diabetes mellitus without complication (HCC)    Difficult intubation    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 it was hard to get the tube down his throat   Dyspnea    Family history of breast cancer    Fatty liver    Fatty liver    Frequent  headaches    GERD (gastroesophageal reflux disease)    History of chicken pox    History of fainting spells of unknown cause    History of prostate cancer    Hyperlipidemia    Hypertension    Myocardial infarction (HCC)    Peripheral neuropathy    Pneumonia    Prostate cancer (HCC)    PTSD (post-traumatic stress disorder)    Sleep apnea    uses Cpap    Tobacco Use: Social History   Tobacco Use  Smoking Status Former   Current packs/day: 0.00   Types: Cigarettes   Quit date: 12/14/1975   Years since quitting: 47.8  Smokeless Tobacco Never    Labs: Review Flowsheet  More data exists      Latest Ref Rng & Units 02/21/2022 04/08/2022 07/07/2022 12/10/2022 06/09/2023  Labs for ITP Cardiac and Pulmonary Rehab  Cholestrol 0 - 200 mg/dL 782  - - - 95   LDL (calc) 0 - 99 mg/dL 66  - - - 47   HDL-C >95 mg/dL 62.13  - - - 40   Trlycerides <150 mg/dL 086.5  - - - 42   Hemoglobin A1c 4.8 - 5.6 % - 9.9  6.8  6.8  7.5     Details            Capillary Blood Glucose: Lab Results  Component Value Date   GLUCAP 159 (H) 09/07/2023   GLUCAP 111 (H) 08/24/2023   GLUCAP 148 (H) 08/24/2023   GLUCAP 143 (H) 08/19/2023   GLUCAP 184 (H) 08/19/2023     Exercise Target Goals: Exercise Program Goal: Individual exercise prescription set using results from initial 6 min walk test and THRR while considering  patient's activity barriers and safety.   Exercise Prescription Goal: Initial exercise prescription builds to 30-45 minutes a day of aerobic activity, 2-3 days per week.  Home exercise guidelines will be given to patient during program as part of exercise prescription that the participant will acknowledge.  Activity Barriers & Risk Stratification:  Activity Barriers & Cardiac Risk Stratification - 08/11/23 1218       Activity Barriers & Cardiac Risk Stratification   Activity Barriers Arthritis;Back Problems;Joint Problems;Deconditioning;Muscular Weakness;Shortness of  Breath;Balance Concerns;History of Falls;Assistive Device;Other (comment)    Comments Neuropathy in the feet    Cardiac Risk Stratification High             6 Minute Walk:  6 Minute Walk     Row Name 08/11/23 1045         6 Minute Walk   Phase Initial  Used rollator  Distance 600 feet  Used rollator     Walk Time 6 minutes     # of Rest Breaks 0     MPH 1.14     METS 1     RPE 13     Perceived Dyspnea  2     VO2 Peak 3.4     Symptoms Yes (comment)     Comments SOB, RPD = 2, chronic Rt hip pain 3/10, legs tired, arms tired from leaning on rollator     Resting HR 65 bpm     Resting BP 130/66     Resting Oxygen Saturation  95 %     Exercise Oxygen Saturation  during 6 min walk 95 %     Max Ex. HR 103 bpm     Max Ex. BP 154/68     2 Minute Post BP 148/74              Oxygen Initial Assessment:   Oxygen Re-Evaluation:   Oxygen Discharge (Final Oxygen Re-Evaluation):   Initial Exercise Prescription:  Initial Exercise Prescription - 08/11/23 1200       Date of Initial Exercise RX and Referring Provider   Date 08/11/23    Referring Provider Jodelle Red, MD    Expected Discharge Date 11/04/23      T5 Nustep   Level 1    SPM 60    Minutes 25    METs 1      Prescription Details   Frequency (times per week) 3    Duration Progress to 30 minutes of continuous aerobic without signs/symptoms of physical distress      Intensity   THRR 40-80% of Max Heartrate 57 - 114    Ratings of Perceived Exertion 11-13    Perceived Dyspnea 0-4      Progression   Progression Continue progressive overload as per policy without signs/symptoms or physical distress.      Resistance Training   Training Prescription Yes    Weight 3 lbs    Reps 10-15             Perform Capillary Blood Glucose checks as needed.  Exercise Prescription Changes:   Exercise Prescription Changes     Row Name 08/19/23 1637 09/04/23 1613 09/18/23 1641 10/05/23 1654        Response to Exercise   Blood Pressure (Admit) 130/78 122/62 140/68 126/64    Blood Pressure (Exercise) 134/70 128/78 122/56 --    Blood Pressure (Exit) 110/60 124/68 110/62 112/70    Heart Rate (Admit) 55 bpm 73 bpm 69 bpm 70 bpm    Heart Rate (Exercise) 74 bpm 91 bpm 101 bpm 111 bpm    Heart Rate (Exit) 55 bpm 66 bpm 65 bpm 66 bpm    Rating of Perceived Exertion (Exercise) 11 11 11 13     Perceived Dyspnea (Exercise) 0 0 0 2    Symptoms -- -- -- Seated rest breaks and SOB, resolved with rest    Comments Pt first day in the [facility] program REVD MET's, goals and home ExRx REVD MET's REVD MET's and goals    Duration Progress to 30 minutes of  aerobic without signs/symptoms of physical distress Progress to 30 minutes of  aerobic without signs/symptoms of physical distress Progress to 30 minutes of  aerobic without signs/symptoms of physical distress Progress to 30 minutes of  aerobic without signs/symptoms of physical distress    Intensity THRR unchanged THRR unchanged THRR unchanged THRR unchanged  Progression   Progression Continue to progress workloads to maintain intensity without signs/symptoms of physical distress. Continue to progress workloads to maintain intensity without signs/symptoms of physical distress. Continue to progress workloads to maintain intensity without signs/symptoms of physical distress. Continue to progress workloads to maintain intensity without signs/symptoms of physical distress.    Average METs 2.6 3 2.7 2.74      Resistance Training   Training Prescription No Yes Yes Yes    Weight -- 3 lbs wts 3 lbs wts 3 lbs wts    Reps -- 10-15 10-15 10-15    Time -- 10 Minutes 10 Minutes 10 Minutes      T5 Nustep   Level 1 3 3 4     SPM 100 89 -- --    Minutes 25 25 15 15     METs 2.3 3 3  3.2      Track   Laps -- -- 11 10    Minutes -- -- 15 15    METs -- -- 2.4 2.28      Home Exercise Plan   Plans to continue exercise at -- Home (comment) Home (comment)  Home (comment)    Frequency -- Add 2 additional days to program exercise sessions. Add 2 additional days to program exercise sessions. Add 2 additional days to program exercise sessions.    Initial Home Exercises Provided -- 09/04/23 09/04/23 09/04/23             Exercise Comments:   Exercise Comments     Row Name 08/19/23 1643 09/04/23 1618 09/18/23 1645 10/05/23 1701     Exercise Comments Pt first day in the [facility] program. Pt tolerated exercise well with an average MET level of 2.6. Pt is learning his THRR, RPE and ExRx Reviewed MET's, goals and home ExRx. Pt tolerated exercise well with an average MET level of 3.0. Pt is doing well and progressing his MET's, he feels good with the progress he's making and feels good about his goals. He is increasing strength and stamina and feels he's able to do more with his grandson now. He will exercise on his own by walking and doing hand weights at home 1-2 days a week for 30 mins per session Pt first day in the [facility] program. Pt tolerated exercise well with an average MET level of 2.7. He is very eager to get back to exercise and wanted to try adding a second modality today. Second modality was walking the track for 15 mins if tolerated, he did great and took breaks as needed but was able to complete 15 mmins. He had an episode of CP last week, but is feeling much better today. Reviewed MET's and goals. Pt tolerated exercise well with an average MET level of 2.74. Pt is feeling good about his goals and is increasing leg strength and stamina. He is continuing to have INC SOB, but SaO2 ok at 96%             Exercise Goals and Review:   Exercise Goals     Row Name 07/02/23 1332 08/11/23 1258           Exercise Goals   Increase Physical Activity Yes Yes      Intervention Provide advice, education, support and counseling about physical activity/exercise needs.;Develop an individualized exercise prescription for aerobic and  resistive training based on initial evaluation findings, risk stratification, comorbidities and participant's personal goals. Provide advice, education, support and counseling about physical activity/exercise needs.;Develop an individualized  exercise prescription for aerobic and resistive training based on initial evaluation findings, risk stratification, comorbidities and participant's personal goals.      Expected Outcomes Short Term: Attend rehab on a regular basis to increase amount of physical activity.;Long Term: Exercising regularly at least 3-5 days a week.;Long Term: Add in home exercise to make exercise part of routine and to increase amount of physical activity. Short Term: Attend rehab on a regular basis to increase amount of physical activity.;Long Term: Exercising regularly at least 3-5 days a week.;Long Term: Add in home exercise to make exercise part of routine and to increase amount of physical activity.      Increase Strength and Stamina Yes Yes      Intervention Provide advice, education, support and counseling about physical activity/exercise needs.;Develop an individualized exercise prescription for aerobic and resistive training based on initial evaluation findings, risk stratification, comorbidities and participant's personal goals. Provide advice, education, support and counseling about physical activity/exercise needs.;Develop an individualized exercise prescription for aerobic and resistive training based on initial evaluation findings, risk stratification, comorbidities and participant's personal goals.      Expected Outcomes Short Term: Increase workloads from initial exercise prescription for resistance, speed, and METs.;Long Term: Improve cardiorespiratory fitness, muscular endurance and strength as measured by increased METs and functional capacity ( );Short Term: Perform resistance training exercises routinely during rehab and add in resistance training at home Short Term:  Increase workloads from initial exercise prescription for resistance, speed, and METs.;Long Term: Improve cardiorespiratory fitness, muscular endurance and strength as measured by increased METs and functional capacity ( );Short Term: Perform resistance training exercises routinely during rehab and add in resistance training at home      Able to understand and use rate of perceived exertion (RPE) scale Yes Yes      Intervention Provide education and explanation on how to use RPE scale Provide education and explanation on how to use RPE scale      Expected Outcomes Short Term: Able to use RPE daily in rehab to express subjective intensity level;Long Term:  Able to use RPE to guide intensity level when exercising independently Short Term: Able to use RPE daily in rehab to express subjective intensity level;Long Term:  Able to use RPE to guide intensity level when exercising independently      Able to understand and use Dyspnea scale Yes --      Intervention Provide education and explanation on how to use Dyspnea scale Provide education and explanation on how to use Dyspnea scale      Expected Outcomes Short Term: Able to use Dyspnea scale daily in rehab to express subjective sense of shortness of breath during exertion;Long Term: Able to use Dyspnea scale to guide intensity level when exercising independently Short Term: Able to use Dyspnea scale daily in rehab to express subjective sense of shortness of breath during exertion;Long Term: Able to use Dyspnea scale to guide intensity level when exercising independently      Knowledge and understanding of Target Heart Rate Range (THRR) Yes Yes      Intervention Provide education and explanation of THRR including how the numbers were predicted and where they are located for reference Provide education and explanation of THRR including how the numbers were predicted and where they are located for reference      Expected Outcomes Short Term: Able to state/look  up THRR;Short Term: Able to use daily as guideline for intensity in rehab;Long Term: Able to use THRR to govern intensity when exercising independently  Short Term: Able to state/look up THRR;Short Term: Able to use daily as guideline for intensity in rehab;Long Term: Able to use THRR to govern intensity when exercising independently      Understanding of Exercise Prescription Yes Yes      Intervention Provide education, explanation, and written materials on patient's individual exercise prescription Provide education, explanation, and written materials on patient's individual exercise prescription      Expected Outcomes Short Term: Able to explain program exercise prescription;Long Term: Able to explain home exercise prescription to exercise independently Short Term: Able to explain program exercise prescription;Long Term: Able to explain home exercise prescription to exercise independently               Exercise Goals Re-Evaluation :  Exercise Goals Re-Evaluation     Row Name 08/19/23 1641 09/04/23 1616 10/05/23 1658         Exercise Goal Re-Evaluation   Exercise Goals Review Increase Physical Activity;Understanding of Exercise Prescription;Increase Strength and Stamina;Knowledge and understanding of Target Heart Rate Range (THRR);Able to understand and use rate of perceived exertion (RPE) scale Increase Physical Activity;Understanding of Exercise Prescription;Increase Strength and Stamina;Knowledge and understanding of Target Heart Rate Range (THRR);Able to understand and use rate of perceived exertion (RPE) scale Increase Physical Activity;Understanding of Exercise Prescription;Increase Strength and Stamina;Knowledge and understanding of Target Heart Rate Range (THRR);Able to understand and use rate of perceived exertion (RPE) scale     Comments Pt first day in the [facility] program. Pt tolerated exercise well with an average MET level of 2.6. Pt is learning his THRR, RPE and ExRx Reviewed  MET's, goals and home ExRx. Pt tolerated exercise well with an average MET level of 3.0. Pt is doing well and progressing his MET's, he feels good with the progress he's making and feels good about his goals. He is increasing strength and stamina and feels he's able to do more with his grandson now. He will exercise on his own by walking and doing hand weights at home 1-2 days a week for 30 mins per session Reviewed MET's and goals. Pt tolerated exercise well with an average MET level of 2.74. Pt is feeling good about his goals and is increasing leg strength and stamina. He is continuing to have INC SOB, but SaO2 ok at 96%     Expected Outcomes Will continue to monitor pt and progress workloads as tolerated without sign or symptom Will continue to monitor pt and progress workloads as tolerated without sign or symptom Will continue to monitor pt and progress workloads as tolerated without sign or symptom              Discharge Exercise Prescription (Final Exercise Prescription Changes):  Exercise Prescription Changes - 10/05/23 1654       Response to Exercise   Blood Pressure (Admit) 126/64    Blood Pressure (Exit) 112/70    Heart Rate (Admit) 70 bpm    Heart Rate (Exercise) 111 bpm    Heart Rate (Exit) 66 bpm    Rating of Perceived Exertion (Exercise) 13    Perceived Dyspnea (Exercise) 2    Symptoms Seated rest breaks and SOB, resolved with rest    Comments REVD MET's and goals    Duration Progress to 30 minutes of  aerobic without signs/symptoms of physical distress    Intensity THRR unchanged      Progression   Progression Continue to progress workloads to maintain intensity without signs/symptoms of physical distress.    Average METs  2.74      Resistance Training   Training Prescription Yes    Weight 3 lbs wts    Reps 10-15    Time 10 Minutes      T5 Nustep   Level 4    Minutes 15    METs 3.2      Track   Laps 10    Minutes 15    METs 2.28      Home Exercise Plan    Plans to continue exercise at Home (comment)    Frequency Add 2 additional days to program exercise sessions.    Initial Home Exercises Provided 09/04/23             Nutrition:  Target Goals: Understanding of nutrition guidelines, daily intake of sodium 1500mg , cholesterol 200mg , calories 30% from fat and 7% or less from saturated fats, daily to have 5 or more servings of fruits and vegetables.  Biometrics:  Pre Biometrics - 08/11/23 0955       Pre Biometrics   Waist Circumference 46.5 inches    Hip Circumference 48.5 inches    Waist to Hip Ratio 0.96 %    Triceps Skinfold 25 mm    % Body Fat 35.7 %    Grip Strength 20 kg    Flexibility 0 in   Unable to reach   Single Leg Stand 0 seconds   Pt unable to perform due to poor balance             Nutrition Therapy Plan and Nutrition Goals:  Nutrition Therapy & Goals - 09/18/23 1501       Nutrition Therapy   Diet Heart healthy/Carbohydrate Consistent Diet    Drug/Food Interactions Statins/Certain Fruits      Personal Nutrition Goals   Nutrition Goal Patient to identify strategies for reducing cardiovascular risk by attending the [facility] education and nutrition series weekly.   goal in action.   Personal Goal #2 Patient to improve diet quality by using the plate method as a guide for meal planning to include lean protein/plant protein, fruits, vegetables, whole grains, nonfat dairy as part of a well-balanced diet.   goal in progress.   Personal Goal #3 Patient to reduce sodium intake to 1500mg  per day   goal in progress.   Personal Goal #4 Patient to identify strategies for improving blood sugar control with goal A1c <7%   goal in progress.   Comments Goals in progress. Al has medical history CKD3, pancreatic cancer, HTN, Afib, CAD, PAD, heart failure. He continues to attend the [facility] education and nutrition series regularly. Per last cardiology follow-up on 10/1, added hydralazine to aid with variable blood pressure  readings and discussed the addition of GLP-1 medication. LDL remains well controlled <55. He is down 2.4# since starting with our program. His wife is a good support. Patient will benefit from participation in intensive cardiac rehab for nutrition, exercise, and lifestyle modification.      Intervention Plan   Intervention Prescribe, educate and counsel regarding individualized specific dietary modifications aiming towards targeted core components such as weight, hypertension, lipid management, diabetes, heart failure and other comorbidities.;Nutrition handout(s) given to patient.    Expected Outcomes Short Term Goal: Understand basic principles of dietary content, such as calories, fat, sodium, cholesterol and nutrients.;Long Term Goal: Adherence to prescribed nutrition plan.             Nutrition Assessments:  Nutrition Assessments - 08/19/23 1601       Rate Your Plate  Scores   Pre Score 39            MEDIFICTS Score Key: >=70 Need to make dietary changes  40-70 Heart Healthy Diet <= 40 Therapeutic Level Cholesterol Diet   Flowsheet Row INTENSIVE CARDIAC REHAB from 08/19/2023 in [facility] Heart, Vascular, & Lung [facility]  Picture Your Plate Total Score on Admission 39      Picture Your Plate Scores: <29 Unhealthy dietary pattern with much room for improvement. 41-50 Dietary pattern unlikely to meet recommendations for good health and room for improvement. 51-60 More healthful dietary pattern, with some room for improvement.  >60 Healthy dietary pattern, although there may be some specific behaviors that could be improved.    Nutrition Goals Re-Evaluation:  Nutrition Goals Re-Evaluation     Row Name 08/19/23 1540 09/18/23 1501           Goals   Current Weight 255 lb 4.7 oz (115.8 kg) 256 lb 6.3 oz (116.3 kg)      Comment A1c 7.5, Cr 1.54, GFR 54, HDL 40, LipoproteinA WNL Cr 1.54, GFR 46; other most recent labs  A1c 7.5,  HDL 40,  LipoproteinA WNL      Expected Outcome Al has medical history CKD3, pancreatic cancer, HTN, Afib, CAD, PAD, heart failure, His wife is a good support. Patient will benefit from participation in intensive cardiac rehab for nutrition, exercise, and lifestyle modification. Goals in progress. Al has medical history CKD3, pancreatic cancer, HTN, Afib, CAD, PAD, heart failure. He continues to attend the [facility] education and nutrition series regularly. Per last cardiology follow-up on 10/1, added hydralazine to aid with variable blood pressure readings and discussed the addition of GLP-1 medication. LDL remains well controlled <55. He is down 2.4# since starting with our program. His wife is a good support. Patient will benefit from participation in intensive cardiac rehab for nutrition, exercise, and lifestyle modification.               Nutrition Goals Re-Evaluation:  Nutrition Goals Re-Evaluation     Row Name 08/19/23 1540 09/18/23 1501           Goals   Current Weight 255 lb 4.7 oz (115.8 kg) 256 lb 6.3 oz (116.3 kg)      Comment A1c 7.5, Cr 1.54, GFR 54, HDL 40, LipoproteinA WNL Cr 1.54, GFR 46; other most recent labs  A1c 7.5,  HDL 40, LipoproteinA WNL      Expected Outcome Al has medical history CKD3, pancreatic cancer, HTN, Afib, CAD, PAD, heart failure, His wife is a good support. Patient will benefit from participation in intensive cardiac rehab for nutrition, exercise, and lifestyle modification. Goals in progress. Al has medical history CKD3, pancreatic cancer, HTN, Afib, CAD, PAD, heart failure. He continues to attend the [facility] education and nutrition series regularly. Per last cardiology follow-up on 10/1, added hydralazine to aid with variable blood pressure readings and discussed the addition of GLP-1 medication. LDL remains well controlled <55. He is down 2.4# since starting with our program. His wife is a good support. Patient will benefit from participation in intensive cardiac  rehab for nutrition, exercise, and lifestyle modification.               Nutrition Goals Discharge (Final Nutrition Goals Re-Evaluation):  Nutrition Goals Re-Evaluation - 09/18/23 1501       Goals   Current Weight 256 lb 6.3 oz (116.3 kg)    Comment Cr 1.54, GFR 46; other  most recent labs  A1c 7.5,  HDL 40, LipoproteinA WNL    Expected Outcome Goals in progress. Al has medical history CKD3, pancreatic cancer, HTN, Afib, CAD, PAD, heart failure. He continues to attend the [facility] education and nutrition series regularly. Per last cardiology follow-up on 10/1, added hydralazine to aid with variable blood pressure readings and discussed the addition of GLP-1 medication. LDL remains well controlled <55. He is down 2.4# since starting with our program. His wife is a good support. Patient will benefit from participation in intensive cardiac rehab for nutrition, exercise, and lifestyle modification.             Psychosocial: Target Goals: Acknowledge presence or absence of significant depression and/or stress, maximize coping skills, provide positive support system. Participant is able to verbalize types and ability to use techniques and skills needed for reducing stress and depression.  Initial Review & Psychosocial Screening:  Initial Psych Review & Screening - 08/11/23 1215       Initial Review   Current issues with History of Depression;Current Psychotropic Meds;Current Depression      Family Dynamics   Good Support System? Yes   Has sposue, son daughter, and grandson for support     Barriers   Psychosocial barriers to participate in program The patient should benefit from training in stress management and relaxation.      Screening Interventions   Interventions Encouraged to exercise    Expected Outcomes Short Term goal: Identification and review with participant of any Quality of Life or Depression concerns found by scoring the questionnaire.;Long Term goal: The participant  improves quality of Life and PHQ9 Scores as seen by post scores and/or verbalization of changes             Quality of Life Scores:  Quality of Life - 08/11/23 1259       Quality of Life   Select Quality of Life      Quality of Life Scores   Health/Function Pre 10.9 %    Socioeconomic Pre 25.21 %    Psych/Spiritual Pre 22.07 %    Family Pre 30 %    GLOBAL Pre 18.96 %            Scores of 19 and below usually indicate a poorer quality of life in these areas.  A difference of  2-3 points is a clinically meaningful difference.  A difference of 2-3 points in the total score of the Quality of Life Index has been associated with significant improvement in overall quality of life, self-image, physical symptoms, and general health in studies assessing change in quality of life.  PHQ-9: Review Flowsheet  More data exists      08/11/2023 06/12/2023 12/16/2022 12/10/2022 07/07/2022  Depression screen PHQ 2/9  Decreased Interest 2 3 0 0 0  Down, Depressed, Hopeless 0 2 0 0 0  PHQ - 2 Score 2 5 0 0 0  Altered sleeping 0 0 0 0 0  Tired, decreased energy 2 3 0 2 0  Change in appetite 1 3 0 3 0  Feeling bad or failure about yourself  0 3 0 0 0  Trouble concentrating 1 3 0 0 0  Moving slowly or fidgety/restless 0 0 0 0 0  Suicidal thoughts 0 0 0 0 0  PHQ-9 Score 6 17 0 5 0  Difficult doing work/chores Somewhat difficult Very difficult Not difficult at all Not difficult at all -    Details  Interpretation of Total Score  Total Score Depression Severity:  1-4 = Minimal depression, 5-9 = Mild depression, 10-14 = Moderate depression, 15-19 = Moderately severe depression, 20-27 = Severe depression   Psychosocial Evaluation and Intervention:   Psychosocial Re-Evaluation:  Psychosocial Re-Evaluation     Row Name 08/20/23 1409 09/09/23 0842 10/06/23 1542         Psychosocial Re-Evaluation   Current issues with History of Depression;Current Stress Concerns Current  Stress Concerns;History of Depression Current Stress Concerns;History of Depression     Comments Al admits to being dissatisfied with his health due to his recent MI and deconditioned state of health. Al admits to being inactive and hopes that participating in cardiac rehab will increase his endurance. Al admits to overeating, admits to like eating ice cream every day. Al denies being depressed.  Reviewed quality of life forwarded to PCP, Dr [name] [place]. Al has not voiced any increased concerns or stresors during exercise at cardiac rehab. Exercise is currently on hold. Al has not voiced any increased concerns or stresors during exercise at cardiac rehab. Al has returned to exercise.     Expected Outcomes -- Al will have controlled or decreased depression/ stressors upon completion of cardiac rehab. Al will have controlled or decreased depression/ stressors upon completion of cardiac rehab.     Interventions Encouraged to attend Cardiac Rehabilitation for the exercise;Stress management education Encouraged to attend Cardiac Rehabilitation for the exercise;Stress management education;Relaxation education Encouraged to attend Cardiac Rehabilitation for the exercise;Stress management education;Relaxation education     Continue Psychosocial Services  Follow up required by staff Follow up required by staff Follow up required by staff       Initial Review   Source of Stress Concerns Chronic Illness;Unable to perform yard/household activities;Unable to participate in former interests or hobbies -- Chronic Illness     Comments Will continue to monitor and noffer support as needed Al will continue to participate in cardiac rehab for exercise, nutrition and lifestyle modifications Al will continue to participate in cardiac rehab for exercise, nutrition and lifestyle modifications              Psychosocial Discharge (Final Psychosocial Re-Evaluation):  Psychosocial Re-Evaluation - 10/06/23 1542        Psychosocial Re-Evaluation   Current issues with Current Stress Concerns;History of Depression    Comments Al has not voiced any increased concerns or stresors during exercise at cardiac rehab. Al has returned to exercise.    Expected Outcomes Al will have controlled or decreased depression/ stressors upon completion of cardiac rehab.    Interventions Encouraged to attend Cardiac Rehabilitation for the exercise;Stress management education;Relaxation education    Continue Psychosocial Services  Follow up required by staff      Initial Review   Source of Stress Concerns Chronic Illness    Comments Al will continue to participate in cardiac rehab for exercise, nutrition and lifestyle modifications             Vocational Rehabilitation: Provide vocational rehab assistance to qualifying candidates.   Vocational Rehab Evaluation & Intervention:  Vocational Rehab - 08/11/23 1217       Initial Vocational Rehab Evaluation & Intervention   Assessment shows need for Vocational Rehabilitation No   Pt is retired            Education: Education Goals: Education classes will be provided on a weekly basis, covering required topics. Participant will state understanding/return demonstration of topics presented.    Education  Row Name 08/19/23 1500     Education   Cardiac Education Topics P[facility]ikin   Orthoptist   Educator Dietitian   Weekly Topic Personalizing Your Pritikin Plate   Instruction Review Code 1- Verbalizes Understanding   Class Start Time 1400   Class Stop Time 1440   Class Time Calculation (min) 40 min    Row Name 08/21/23 1500     Education   Cardiac Education Topics [facility]ikin   Select Workshops     Workshops   Educator Exercise Physiologist   Select Exercise   Exercise Workshop Location manager and Fall Prevention   Instruction Review Code 1- Verbalizes Understanding   Class Start Time 1405   Class Stop Time 1455   Class  Time Calculation (min) 50 min    Row Name 08/24/23 1600     Education   Cardiac Education Topics [facility]   Glass blower/designer Nutrition   Nutrition Workshop Label Reading   Instruction Review Code 1- Tax inspector   Class Start Time 1400   Class Stop Time 1445   Class Time Calculation (min) 45 min    Row Name 08/26/23 1600     Education   Cardiac Education Topics Pritikin   Customer service manager   Weekly Topic [employer] Desserts   Instruction Review Code 1- Verbalizes Understanding   Class Start Time 1400   Class Stop Time 1442   Class Time Calculation (min) 42 min    Row Name 08/28/23 1500     Education   Cardiac Education Topics [facility]in   Licensed conveyancer Nutrition   Nutrition Other  label reading   Instruction Review Code 1- Verbalizes Understanding   Class Start Time 1400   Class Stop Time 1500   Class Time Calculation (min) 60 min    Row Name 08/31/23 1500     Workshops   Biomedical scientist Psychosocial   Psychosocial Workshop Recognizing and Reducing Stress   Instruction Review Code 1- Verbalizes Understanding   Class Start Time 1400   Class Stop Time 1447   Class Time Calculation (min) 47 min    Row Name 09/04/23 1400     Education   Cardiac Education Topics Pritikin   Nurse, children's Exercise Physiologist   Select Nutrition   Nutrition Calorie Density   Instruction Review Code 1- Verbalizes Understanding   Class Start Time 1358   Class Stop Time 1432   Class Time Calculation (min) 34 min    Row Name 09/07/23 1500     Education   Cardiac Education Topics Pritikin   Select Workshops     Workshops   Educator Exercise Physiologist   Select Exercise   Exercise Workshop Exercise Basics: Building Your Action Plan   Instruction Review Code 1-  Verbalizes Understanding   Class Start Time 1358   Class Stop Time 1449   Class Time Calculation (min) 51 min    Row Name 09/18/23 1500     Education   Cardiac Education Topics [facility]rit[facility]in   Writer General Education   General Education Hypertension and Heart Disease   Instruction  Review Code 1- Verbalizes Understanding   Class Start Time 1400   Class Stop Time 1440   Class Time Calculation (min) 40 min    Row Name 09/21/23 1500     Education   Cardiac Education Topics Pritikin   Licensed conveyancer Nutrition   Nutrition Dining Out - Part 1   Instruction Review Code 1- Verbalizes Understanding   Class Start Time 1400   Class Stop Time 1445   Class Time Calculation (min) 45 min    Row Name 09/23/23 1500     Education   Cardiac Education Topics Pritikin   Customer service manager   Weekly Topic Comforting Weekend Breakfasts   Instruction Review Code 1- Verbalizes Understanding   Class Start Time 1400   Class Stop Time 1445   Class Time Calculation (min) 45 min    Row Name 09/25/23 1500     Education   Cardiac Education Topics Pritikin   Select Workshops     Workshops   Educator Exercise Physiologist   Select Psychosocial   Psychosocial Workshop Focused Goals, Sustainable Changes   Instruction Review Code 1- Verbalizes Understanding   Class Start Time 1402   Class Stop Time 1445   Class Time Calculation (min) 43 min    Row Name 09/28/23 1500     Education   Cardiac Education Topics [facility]in   Psychologist, forensic Exercise Education   Exercise Education Biomechanial Limitations   Instruction Review Code 1- Verbalizes Understanding   Class Start Time 1400   Class Stop Time 1449   Class Time Calculation (min) 49 min    Row Name 10/05/23 1500     Education    Cardiac Education Topics [facility]rit[facility]in   Glass blower/designer Nutrition   Nutrition Workshop Fueling a Forensic psychologist   Instruction Review Code 1- Teaching laboratory technician Start Time 1400   Class Stop Time 1445   Class Time Calculation (min) 45 min            Core Videos: Exercise    Move It!  Clinical staff conducted group or individual video education with verbal and written material and guidebook.  Patient learns the recommended [facility]in exercise program. Exercise with the goal of living a long, healthy life. Some of the health benefits of exercise include controlled diabetes, healthier blood pressure levels, improved cholesterol levels, improved heart and lung capacity, improved sleep, and better body composition. Everyone should speak with their doctor before starting or changing an exercise routine.  Biomechanical Limitations Clinical staff conducted group or individual video education with verbal and written material and guidebook.  Patient learns how biomechanical limitations can impact exercise and how we can mitigate and possibly overcome limitations to have an impactful and balanced exercise routine.  Body Composition Clinical staff conducted group or individual video education with verbal and written material and guidebook.  Patient learns that body composition (ratio of muscle mass to fat mass) is a key component to assessing overall fitness, rather than body weight alone. Increased fat mass, especially visceral belly fat, can put [place] at increased risk for metabolic syndrome, type 2 diabetes, heart disease, and even death. It is recommended to combine diet and exercise (cardiovascular and resistance  training) to improve your body composition. Seek guidance from your physician and exercise physiologist before implementing an exercise routine.  Exercise Action Plan Clinical staff conducted group or individual video education with  verbal and written material and guidebook.  Patient learns the recommended strategies to achieve and enjoy long-term exercise adherence, including variety, self-motivation, self-efficacy, and positive decision making. Benefits of exercise include fitness, good health, weight management, more energy, better sleep, less stress, and overall well-being.  Medical   Heart Disease Risk Reduction Clinical staff conducted group or individual video education with verbal and written material and guidebook.  Patient learns our heart is our most vital organ as it circulates oxygen, nutrients, white blood cells, and hormones throughout the entire body, and carries waste away. Data supports a plant-based eating plan like the Pritikin Program for its effectiveness in slowing progression of and reversing heart disease. The video provides a number of recommendations to address heart disease.   Metabolic Syndrome and Belly Fat  Clinical staff conducted group or individual video education with verbal and written material and guidebook.  Patient learns what metabolic syndrome is, how it leads to heart disease, and how one can reverse it and keep it from coming back. You have metabolic syndrome if you have 3 of the following 5 criteria: abdominal obesity, high blood pressure, high triglycerides, low HDL cholesterol, and high blood sugar.  Hypertension and Heart Disease Clinical staff conducted group or individual video education with verbal and written material and guidebook.  Patient learns that high blood pressure, or hypertension, is very common in the [place]. Hypertension is largely due to excessive salt intake, but other important risk factors include being overweight, physical inactivity, drinking too much alcohol, smoking, and not eating enough potassium from fruits and vegetables. High blood pressure is a leading risk factor for heart attack, stroke, congestive heart failure, dementia, kidney failure, and  premature death. Long-term effects of excessive salt intake include stiffening of the arteries and thickening of heart muscle and organ damage. Recommendations include ways to reduce hypertension and the risk of heart disease.  Diseases of Our Time - Focusing on Diabetes Clinical staff conducted group or individual video education with verbal and written material and guidebook.  Patient learns why the best way to stop diseases of our time is prevention, through food and other lifestyle changes. Medicine (such as prescription pills and surgeries) is often only a Band-Aid on the problem, not a long-term solution. Most common diseases of our time include obesity, type 2 diabetes, hypertension, heart disease, and cancer. The Pritikin Program is recommended and has been proven to help reduce, reverse, and/or prevent the damaging effects of metabolic syndrome.  Nutrition   Overview of the Pritikin Eating Plan  Clinical staff conducted group or individual video education with verbal and written material and guidebook.  Patient learns about the Pritikin Eating Plan for disease risk reduction. The Pritikin Eating Plan emphasizes a wide variety of unrefined, minimally-processed carbohydrates, like fruits, vegetables, whole grains, and legumes. Go, Caution, and Stop food choices are explained. Plant-based and lean animal proteins are emphasized. Rationale provided for low sodium intake for blood pressure control, low added sugars for blood sugar stabilization, and low added fats and oils for coronary artery disease risk reduction and weight management.  Calorie Density  Clinical staff conducted group or individual video education with verbal and written material and guidebook.  Patient learns about calorie density and how it impacts the Pritikin Eating Plan. Knowing the characteristics of the  food you choose will help you decide whether those foods will lead to weight gain or weight loss, and whether you want to  consume more or less of them. Weight loss is usually a side effect of the Pritikin Eating Plan because of its focus on low calorie-dense foods.  Label Reading  Clinical staff conducted group or individual video education with verbal and written material and guidebook.  Patient learns about the Pritikin recommended label reading guidelines and corresponding recommendations regarding calorie density, added sugars, sodium content, and whole grains.  Dining Out - Part 1  Clinical staff conducted group or individual video education with verbal and written material and guidebook.  Patient learns that restaurant meals can be sabotaging because they can be so high in calories, fat, sodium, and/or sugar. Patient learns recommended strategies on how to positively address this and avoid unhealthy pitfalls.  Facts on Fats  Clinical staff conducted group or individual video education with verbal and written material and guidebook.  Patient learns that lifestyle modifications can be just as effective, if not more so, as many medications for lowering your risk of heart disease. A Pritikin lifestyle can help to reduce your risk of inflammation and atherosclerosis (cholesterol build-up, or plaque, in the artery walls). Lifestyle interventions such as dietary choices and physical activity address the cause of atherosclerosis. A review of the types of fats and their impact on blood cholesterol levels, along with dietary recommendations to reduce fat intake is also included.  Nutrition Action Plan  Clinical staff conducted group or individual video education with verbal and written material and guidebook.  Patient learns how to incorporate Pritikin recommendations into their lifestyle. Recommendations include planning and keeping personal health goals in mind as an important part of their success.  Healthy Mind-Set    Healthy Minds, Bodies, Hearts  Clinical staff conducted group or individual video education with  verbal and written material and guidebook.  Patient learns how to identify when they are stressed. Video will discuss the impact of that stress, as well as the many benefits of stress management. Patient will also be introduced to stress management techniques. The way we think, act, and feel has an impact on our hearts.  How Our Thoughts Can Heal Our Hearts  Clinical staff conducted group or individual video education with verbal and written material and guidebook.  Patient learns that negative thoughts can cause depression and anxiety. This can result in negative lifestyle behavior and serious health problems. Cognitive behavioral therapy is an effective method to help control our thoughts in order to change and improve our emotional outlook.  Additional Videos:  Exercise    Improving Performance  Clinical staff conducted group or individual video education with verbal and written material and guidebook.  Patient learns to use a non-linear approach by alternating intensity levels and lengths of time spent exercising to help burn more calories and lose more body fat. Cardiovascular exercise helps improve heart health, metabolism, hormonal balance, blood sugar control, and recovery from fatigue. Resistance training improves strength, endurance, balance, coordination, reaction time, metabolism, and muscle mass. Flexibility exercise improves circulation, posture, and balance. Seek guidance from your physician and exercise physiologist before implementing an exercise routine and learn your capabilities and proper form for all exercise.  Introduction to Yoga  Clinical staff conducted group or individual video education with verbal and written material and guidebook.  Patient learns about yoga, a discipline of the coming together of mind, breath, and body. The benefits of yoga include improved  flexibility, improved range of motion, better posture and core strength, increased lung function, weight loss, and  positive self-image. Yoga's heart health benefits include lowered blood pressure, healthier heart rate, decreased cholesterol and triglyceride levels, improved immune function, and reduced stress. Seek guidance from your physician and exercise physiologist before implementing an exercise routine and learn your capabilities and proper form for all exercise.  Medical   Aging: Enhancing Your Quality of Life  Clinical staff conducted group or individual video education with verbal and written material and guidebook.  Patient learns key strategies and recommendations to stay in good physical health and enhance quality of life, such as prevention strategies, having an advocate, securing a Health Care Proxy and Power of Attorney, and keeping a list of medications and system for tracking them. It also discusses how to avoid risk for bone loss.  Biology of Weight Control  Clinical staff conducted group or individual video education with verbal and written material and guidebook.  Patient learns that weight gain occurs because we consume more calories than we burn (eating more, moving less). Even if your body weight is normal, you may have higher ratios of fat compared to muscle mass. Too much body fat puts you at increased risk for cardiovascular disease, heart attack, stroke, type 2 diabetes, and obesity-related cancers. In addition to exercise, following the Pritikin Eating Plan can help reduce your risk.  Decoding Lab Results  Clinical staff conducted group or individual video education with verbal and written material and guidebook.  Patient learns that lab test reflects one measurement whose values change over time and are influenced by many factors, including medication, stress, sleep, exercise, food, hydration, pre-existing medical conditions, and more. It is recommended to use the knowledge from this video to become more involved with your lab results and evaluate your numbers to speak with your  doctor.   Diseases of Our Time - Overview  Clinical staff conducted group or individual video education with verbal and written material and guidebook.  Patient learns that according to the CDC, 50% to 70% of chronic diseases (such as obesity, type 2 diabetes, elevated lipids, hypertension, and heart disease) are avoidable through lifestyle improvements including healthier food choices, listening to satiety cues, and increased physical activity.  Sleep Disorders Clinical staff conducted group or individual video education with verbal and written material and guidebook.  Patient learns how good quality and duration of sleep are important to overall health and well-being. Patient also learns about sleep disorders and how they impact health along with recommendations to address them, including discussing with a physician.  Nutrition  Dining Out - Part 2 Clinical staff conducted group or individual video education with verbal and written material and guidebook.  Patient learns how to plan ahead and communicate in order to maximize their dining experience in a healthy and nutritious manner. Included are recommended food choices based on the type of restaurant the patient is visiting.   Fueling a Banker conducted group or individual video education with verbal and written material and guidebook.  There is a strong connection between our food choices and our health. Diseases like obesity and type 2 diabetes are very prevalent and are in large-part due to lifestyle choices. The Pritikin Eating Plan provides plenty of food and hunger-curbing satisfaction. It is easy to follow, affordable, and helps reduce health risks.  Menu Workshop  Clinical staff conducted group or individual video education with verbal and written material and guidebook.  Patient learns that  restaurant meals can sabotage health goals because they are often packed with calories, fat, sodium, and sugar.  Recommendations include strategies to plan ahead and to communicate with the manager, chef, or server to help order a healthier meal.  Planning Your Eating Strategy  Clinical staff conducted group or individual video education with verbal and written material and guidebook.  Patient learns about the Pritikin Eating Plan and its benefit of reducing the risk of disease. The Pritikin Eating Plan does not focus on calories. Instead, it emphasizes high-quality, nutrient-rich foods. By knowing the characteristics of the foods, we choose, we can determine their calorie density and make informed decisions.  Targeting Your Nutrition Priorities  Clinical staff conducted group or individual video education with verbal and written material and guidebook.  Patient learns that lifestyle habits have a tremendous impact on disease risk and progression. This video provides eating and physical activity recommendations based on your personal health goals, such as reducing LDL cholesterol, losing weight, preventing or controlling type 2 diabetes, and reducing high blood pressure.  Vitamins and Minerals  Clinical staff conducted group or individual video education with verbal and written material and guidebook.  Patient learns different ways to obtain key vitamins and minerals, including through a recommended healthy diet. It is important to discuss all supplements you take with your doctor.   Healthy Mind-Set    Smoking Cessation  Clinical staff conducted group or individual video education with verbal and written material and guidebook.  Patient learns that cigarette smoking and tobacco addiction pose a serious health risk which affects millions of people. Stopping smoking will significantly reduce the risk of heart disease, lung disease, and many forms of cancer. Recommended strategies for quitting are covered, including working with your doctor to develop a successful plan.  Culinary   Becoming a Corporate investment banker conducted group or individual video education with verbal and written material and guidebook.  Patient learns that cooking at home can be healthy, cost-effective, quick, and puts them in control. Keys to cooking healthy recipes will include looking at your recipe, assessing your equipment needs, planning ahead, making it simple, choosing cost-effective seasonal ingredients, and limiting the use of added fats, salts, and sugars.  Cooking - Breakfast and Snacks  Clinical staff conducted group or individual video education with verbal and written material and guidebook.  Patient learns how important breakfast is to satiety and nutrition through the entire day. Recommendations include key foods to eat during breakfast to help stabilize blood sugar levels and to prevent overeating at meals later in the day. Planning ahead is also a key component.  Cooking - Educational psychologist conducted group or individual video education with verbal and written material and guidebook.  Patient learns eating strategies to improve overall health, including an approach to cook more at home. Recommendations include thinking of animal protein as a side on your plate rather than center stage and focusing instead on lower calorie dense options like vegetables, fruits, whole grains, and plant-based proteins, such as beans. Making sauces in large quantities to freeze for later and leaving the skin on your vegetables are also recommended to maximize your experience.  Cooking - Healthy Salads and Dressing Clinical staff conducted group or individual video education with verbal and written material and guidebook.  Patient learns that vegetables, fruits, whole grains, and legumes are the foundations of the Pritikin Eating Plan. Recommendations include how to incorporate each of these in flavorful and healthy salads, and  how to create homemade salad dressings. Proper handling of ingredients is also covered.  Cooking - Soups and [employer] - Soups and Desserts Clinical staff conducted group or individual video education with verbal and written material and guidebook.  Patient learns that Pritikin soups and desserts make for easy, nutritious, and delicious snacks and meal components that are low in sodium, fat, sugar, and calorie density, while high in vitamins, minerals, and filling fiber. Recommendations include simple and healthy ideas for soups and desserts.   Overview     The Pritikin Solution Program Overview Clinical staff conducted group or individual video education with verbal and written material and guidebook.  Patient learns that the results of the Pritikin Program have been documented in more than 100 articles published in peer-reviewed journals, and the benefits include reducing risk factors for (and, in some cases, even reversing) high cholesterol, high blood pressure, type 2 diabetes, obesity, and more! An overview of the three key pillars of the Pritikin Program will be covered: eating well, doing regular exercise, and having a healthy mind-set.  WORKSHOPS  Exercise: Exercise Basics: Building Your Action Plan Clinical staff led group instruction and group discussion with PowerPoint presentation and patient guidebook. To enhance the learning environment the use of posters, models and videos may be added. At the conclusion of this workshop, patients will comprehend the difference between physical activity and exercise, as well as the benefits of incorporating both, into their routine. Patients will understand the FITT (Frequency, Intensity, Time, and Type) principle and how to use it to build an exercise action plan. In addition, safety concerns and other considerations for exercise and cardiac rehab will be addressed by the presenter. The purpose of this lesson is to promote a comprehensive and effective weekly exercise routine in order to improve patients' overall level of  fitness.   Managing Heart Disease: Your Path to a Healthier Heart Clinical staff led group instruction and group discussion with PowerPoint presentation and patient guidebook. To enhance the learning environment the use of posters, models and videos may be added.At the conclusion of this workshop, patients will understand the anatomy and physiology of the heart. Additionally, they will understand how Pritikin's three pillars impact the risk factors, the progression, and the management of heart disease.  The purpose of this lesson is to provide a high-level overview of the heart, heart disease, and how the Pritikin lifestyle positively impacts risk factors.  Exercise Biomechanics Clinical staff led group instruction and group discussion with PowerPoint presentation and patient guidebook. To enhance the learning environment the use of posters, models and videos may be added. Patients will learn how the structural parts of their bodies function and how these functions impact their daily activities, movement, and exercise. Patients will learn how to promote a neutral spine, learn how to manage pain, and identify ways to improve their physical movement in order to promote healthy living. The purpose of this lesson is to expose patients to common physical limitations that impact physical activity. Participants will learn practical ways to adapt and manage aches and pains, and to minimize their effect on regular exercise. Patients will learn how to maintain good posture while sitting, walking, and lifting.  Balance Training and Fall Prevention  Clinical staff led group instruction and group discussion with PowerPoint presentation and patient guidebook. To enhance the learning environment the use of posters, models and videos may be added. At the conclusion of this workshop, patients will understand the importance of their sensorimotor skills (vision,  proprioception, and the vestibular system)  in maintaining their ability to balance as they age. Patients will apply a variety of balancing exercises that are appropriate for their current level of function. Patients will understand the common causes for poor balance, possible solutions to these problems, and ways to modify their physical environment in order to minimize their fall risk. The purpose of this lesson is to teach patients about the importance of maintaining balance as they age and ways to minimize their risk of falling.  WORKSHOPS   Nutrition:  Fueling a Ship broker led group instruction and group discussion with PowerPoint presentation and patient guidebook. To enhance the learning environment the use of posters, models and videos may be added. Patients will review the foundational principles of the Pritikin Eating Plan and understand what constitutes a serving size in each of the food groups. Patients will also learn Pritikin-friendly foods that are better choices when away from home and review make-ahead meal and snack options. Calorie density will be reviewed and applied to three nutrition priorities: weight maintenance, weight loss, and weight gain. The purpose of this lesson is to reinforce (in a group setting) the key concepts around what patients are recommended to eat and how to apply these guidelines when away from home by planning and selecting Pritikin-friendly options. Patients will understand how calorie density may be adjusted for different weight management goals.  Mindful Eating  Clinical staff led group instruction and group discussion with PowerPoint presentation and patient guidebook. To enhance the learning environment the use of posters, models and videos may be added. Patients will briefly review the concepts of the Pritikin Eating Plan and the importance of low-calorie dense foods. The concept of mindful eating will be introduced as well as the importance of paying attention to internal hunger  signals. Triggers for non-hunger eating and techniques for dealing with triggers will be explored. The purpose of this lesson is to provide patients with the opportunity to review the basic principles of the Pritikin Eating Plan, discuss the value of eating mindfully and how to measure internal cues of hunger and fullness using the Hunger Scale. Patients will also discuss reasons for non-hunger eating and learn strategies to use for controlling emotional eating.  Targeting Your Nutrition Priorities Clinical staff led group instruction and group discussion with PowerPoint presentation and patient guidebook. To enhance the learning environment the use of posters, models and videos may be added. Patients will learn how to determine their genetic susceptibility to disease by reviewing their family history. Patients will gain insight into the importance of diet as part of an overall healthy lifestyle in mitigating the impact of genetics and other environmental insults. The purpose of this lesson is to provide patients with the opportunity to assess their personal nutrition priorities by looking at their family history, their own health history and current risk factors. Patients will also be able to discuss ways of prioritizing and modifying the Pritikin Eating Plan for their highest risk areas  Menu  Clinical staff led group instruction and group discussion with PowerPoint presentation and patient guidebook. To enhance the learning environment the use of posters, models and videos may be added. Using menus brought in from [employer], or printed from [employer], patients will apply the Pritikin dining out guidelines that were presented in the [employer] video. Patients will also be able to practice these guidelines in a variety of provided scenarios. The purpose of this lesson is to provide patients with  the opportunity to practice hands-on learning of the [employer] guidelines  with actual menus and practice scenarios.  Label Reading Clinical staff led group instruction and group discussion with PowerPoint presentation and patient guidebook. To enhance the learning environment the use of posters, models and videos may be added. Patients will review and discuss the Pritikin label reading guidelines presented in Pritikin's Label Reading Educational series video. Using fool labels brought in from local grocery stores and markets, patients will apply the label reading guidelines and determine if the packaged food meet the Pritikin guidelines. The purpose of this lesson is to provide patients with the opportunity to review, discuss, and practice hands-on learning of the Pritikin Label Reading guidelines with actual packaged food labels. Cooking School  Pritikin's [employer] are designed to teach patients ways to prepare quick, simple, and affordable recipes at home. The importance of nutrition's role in chronic disease risk reduction is reflected in its emphasis in the overall Pritikin program. By learning how to prepare essential core Pritikin Eating Plan recipes, patients will increase control over what they eat; be able to customize the flavor of foods without the use of added salt, sugar, or fat; and improve the quality of the food they consume. By learning a set of core recipes which are easily assembled, quickly prepared, and affordable, patients are more likely to prepare more healthy foods at home. These workshops focus on convenient breakfasts, simple entres, side dishes, and desserts which can be prepared with minimal effort and are consistent with nutrition recommendations for cardiovascular risk reduction. Cooking [employer] are taught by a Armed forces logistics/support/administrative officer (RD) who has been trained by the [employer]. The chef or RD has a clear understanding of the importance of minimizing - if not completely eliminating - added fat, sugar, and  sodium in recipes. Throughout the series of Cooking School Workshop sessions, patients will learn about healthy ingredients and efficient methods of cooking to build confidence in their capability to prepare    Cooking School weekly topics:  Adding Flavor- Sodium-Free  Fast and Healthy Breakfasts  Powerhouse Plant-Based Proteins  Satisfying Salads and Dressings  Simple Sides and Sauces  International Cuisine-Spotlight on the [employer] Zones  Delicious Desserts  Savory Soups  [employer] - Meals in a Astronomer Appetizers and Snacks  Comforting Weekend Breakfasts  One-Pot Wonders   Fast Evening Meals  Landscape architect Your Pritikin Plate  WORKSHOPS   Healthy Mindset (Psychosocial):  Focused Goals, Sustainable Changes Clinical staff led group instruction and group discussion with PowerPoint presentation and patient guidebook. To enhance the learning environment the use of posters, models and videos may be added. Patients will be able to apply effective goal setting strategies to establish at least one personal goal, and then take consistent, meaningful action toward that goal. They will learn to identify common barriers to achieving personal goals and develop strategies to overcome them. Patients will also gain an understanding of how our mind-set can impact our ability to achieve goals and the importance of cultivating a positive and growth-oriented mind-set. The purpose of this lesson is to provide patients with a deeper understanding of how to set and achieve personal goals, as well as the tools and strategies needed to overcome common obstacles which may arise along the way.  From Head to Heart: The Power of a Healthy Outlook  Clinical staff led group instruction and group discussion with PowerPoint presentation and patient guidebook. To enhance the  learning environment the use of posters, models and videos may be added. Patients will be able to recognize and  describe the impact of emotions and mood on physical health. They will discover the importance of self-care and explore self-care practices which may work for them. Patients will also learn how to utilize the 4 C's to cultivate a healthier outlook and better manage stress and challenges. The purpose of this lesson is to demonstrate to patients how a healthy outlook is an essential part of maintaining good health, especially as they continue their cardiac rehab journey.  Healthy Sleep for a Healthy Heart Clinical staff led group instruction and group discussion with PowerPoint presentation and patient guidebook. To enhance the learning environment the use of posters, models and videos may be added. At the conclusion of this workshop, patients will be able to demonstrate knowledge of the importance of sleep to overall health, well-being, and quality of life. They will understand the symptoms of, and treatments for, common sleep disorders. Patients will also be able to identify daytime and nighttime behaviors which impact sleep, and they will be able to apply these tools to help manage sleep-related challenges. The purpose of this lesson is to provide patients with a general overview of sleep and outline the importance of quality sleep. Patients will learn about a few of the most common sleep disorders. Patients will also be introduced to the concept of "sleep hygiene," and discover ways to self-manage certain sleeping problems through simple daily behavior changes. Finally, the workshop will motivate patients by clarifying the links between quality sleep and their goals of heart-healthy living.   Recognizing and Reducing Stress Clinical staff led group instruction and group discussion with PowerPoint presentation and patient guidebook. To enhance the learning environment the use of posters, models and videos may be added. At the conclusion of this workshop, patients will be able to understand the types of stress  reactions, differentiate between acute and chronic stress, and recognize the impact that chronic stress has on their health. They will also be able to apply different coping mechanisms, such as reframing negative self-talk. Patients will have the opportunity to practice a variety of stress management techniques, such as deep abdominal breathing, progressive muscle relaxation, and/or guided imagery.  The purpose of this lesson is to educate patients on the role of stress in their lives and to provide healthy techniques for coping with it.  Learning Barriers/Preferences:  Learning Barriers/Preferences - 08/11/23 1216       Learning Barriers/Preferences   Learning Barriers Sight    Learning Preferences Audio;Computer/Internet;Pictoral;Verbal Instruction;Video             Education Topics:  Knowledge Questionnaire Score:  Knowledge Questionnaire Score - 08/11/23 1259       Knowledge Questionnaire Score   Pre Score 21/24             Core Components/Risk Factors/Patient Goals at Admission:  Personal Goals and Risk Factors at Admission - 08/11/23 1300       Core Components/Risk Factors/Patient Goals on Admission    Weight Management Yes;Obesity;Weight Loss    Intervention Weight Management: Develop a combined nutrition and exercise program designed to reach desired caloric intake, while maintaining appropriate intake of nutrient and fiber, sodium and fats, and appropriate energy expenditure required for the weight goal.;Weight Management: Provide education and appropriate resources to help participant work on and attain dietary goals.;Weight Management/Obesity: Establish reasonable short term and long term weight goals.;Obesity: Provide education and appropriate resources to help participant  work on and attain dietary goals.    Admit Weight 255 lb 4.7 oz (115.8 kg)    Expected Outcomes Short Term: Continue to assess and modify interventions until short term weight is achieved;Long  Term: Adherence to nutrition and physical activity/exercise program aimed toward attainment of established weight goal;Weight Loss: Understanding of general recommendations for a balanced deficit meal plan, which promotes 1-2 lb weight loss per week and includes a negative energy balance of [phone] kcal/d;Understanding recommendations for meals to include 15-35% energy as protein, 25-35% energy from fat, 35-60% energy from carbohydrates, less than 200mg  of dietary cholesterol, 20-35 gm of total fiber daily;Understanding of distribution of calorie intake throughout the day with the consumption of 4-5 meals/snacks    Improve shortness of breath with ADL's Yes    Intervention Provide education, individualized exercise plan and daily activity instruction to help decrease symptoms of SOB with activities of daily living.    Expected Outcomes Short Term: Improve cardiorespiratory fitness to achieve a reduction of symptoms when performing ADLs;Long Term: Be able to perform more ADLs without symptoms or delay the onset of symptoms    Intervention Provide education about signs/symptoms and action to take for hypo/hyperglycemia.;Provide education about proper nutrition, including hydration, and aerobic/resistive exercise prescription along with prescribed medications to achieve blood glucose in normal ranges: Fasting glucose 65-99 mg/dL    Expected Outcomes Long Term: Attainment of HbA1C < 7%.;Short Term: Participant verbalizes understanding of the signs/symptoms and immediate care of hyper/hypoglycemia, proper foot care and importance of medication, aerobic/resistive exercise and nutrition plan for blood glucose control.    Intervention Provide a combined exercise and nutrition program that is supplemented with education, support and counseling about heart failure. Directed toward relieving symptoms such as shortness of breath, decreased exercise tolerance, and extremity edema.    Expected Outcomes Improve functional  capacity of life;Short term: Attendance in program 2-3 days a week with increased exercise capacity. Reported lower sodium intake. Reported increased fruit and vegetable intake. Reports medication compliance.;Short term: Daily weights obtained and reported for increase. Utilizing diuretic protocols set by physician.;Long term: Adoption of self-care skills and reduction of barriers for early signs and symptoms recognition and intervention leading to self-care maintenance.    Hypertension Yes    Intervention Provide education on lifestyle modifcations including regular physical activity/exercise, weight management, moderate sodium restriction and increased consumption of fresh fruit, vegetables, and low fat dairy, alcohol moderation, and smoking cessation.;Monitor prescription use compliance.    Expected Outcomes Short Term: Continued assessment and intervention until BP is < 140/2mm HG in hypertensive participants. < 130/56mm HG in hypertensive participants with diabetes, heart failure or chronic kidney disease.;Long Term: Maintenance of blood pressure at goal levels.    Lipids Yes    Intervention Provide education and support for participant on nutrition & aerobic/resistive exercise along with prescribed medications to achieve LDL 70mg , HDL >40mg .    Expected Outcomes Short Term: Participant states understanding of desired cholesterol values and is compliant with medications prescribed. Participant is following exercise prescription and nutrition guidelines.;Long Term: Cholesterol controlled with medications as prescribed, with individualized exercise RX and with personalized nutrition plan. Value goals: LDL < 70mg , HDL > 40 mg.    Stress Yes    Intervention Offer individual and/or small group education and counseling on adjustment to heart disease, stress management and health-related lifestyle change. Teach and support self-help strategies.    Expected Outcomes Short Term: Participant demonstrates  changes in health-related behavior, relaxation and other stress management skills, ability to  obtain effective social support, and compliance with psychotropic medications if prescribed.;Long Term: Emotional wellbeing is indicated by absence of clinically significant psychosocial distress or social isolation.             Core Components/Risk Factors/Patient Goals Review:   Goals and Risk Factor Review     Row Name 08/20/23 1413 09/09/23 0846 10/06/23 1543         Core Components/Risk Factors/Patient Goals Review   Personal Goals Review Weight Management/Obesity;Diabetes;Improve shortness of breath with ADL's;Hypertension;Lipids;Stress Weight Management/Obesity;Diabetes;Improve shortness of breath with ADL's;Hypertension;Lipids;Stress Weight Management/Obesity;Diabetes;Improve shortness of breath with ADL's;Hypertension;Lipids;Stress     Review Al started cardiac rehab on 08/20/23. Al did well with exercise for his fitness level. Al is deconditioned Al has done well with exercise at cardiac rehab.  Vital signs have been stable. Al reported having indigestion 09/07/23. 12 lead ordered and obtained. Sent to Ed per onsite provider. Exercise is currently on hold Al has done well with exercise at cardiac rehab.  Vital signs and CBg's have been stable upon Al's return to exercise. Takes multiple rest breaks when walking the track has a high met level on the nustep.     Expected Outcomes Al will continue to participate in cardiac rehab for exercise, nutriiton and lifestyle modifications Al will continue to participate in cardiac rehab for exercise, nutriiton and lifestyle modifications Al will continue to participate in cardiac rehab for exercise, nutriiton and lifestyle modifications              Core Components/Risk Factors/Patient Goals at Discharge (Final Review):   Goals and Risk Factor Review - 10/06/23 1543       Core Components/Risk Factors/Patient Goals Review   Personal Goals Review  Weight Management/Obesity;Diabetes;Improve shortness of breath with ADL's;Hypertension;Lipids;Stress    Review Al has done well with exercise at cardiac rehab.  Vital signs and CBg's have been stable upon Al's return to exercise. Takes multiple rest breaks when walking the track has a high met level on the nustep.    Expected Outcomes Al will continue to participate in cardiac rehab for exercise, nutriiton and lifestyle modifications             ITP Comments:  ITP Comments     Row Name 07/02/23 1332 08/20/23 1352 09/09/23 0832 10/06/23 1541     ITP Comments Dr. Armanda Magic medical director. Introduction to pritikin education program/ intensive cardiac rehab. Initial education packet reviewed with patient. 30 Day ITP Review. Al started cardiac rehab on 08/19/23 and did well with exercise. 30 Day ITP Review. Exercise is currently on hold due to recent ED visit on 09/07/23 30 Day ITP Review. Al has good participation in cardiac rehab when in attendance.             Comments: See ITP comments.Thayer Headings RN BSN

## 2023-10-07 ENCOUNTER — Encounter (HOSPITAL_COMMUNITY): Payer: Medicare Other

## 2023-10-09 ENCOUNTER — Encounter (HOSPITAL_COMMUNITY)
Admission: RE | Admit: 2023-10-09 | Discharge: 2023-10-09 | Disposition: A | Discharge status: Home / Self Care | Payer: Medicare Other | Source: Ambulatory Visit | Attending: Cardiology | Admitting: Cardiology

## 2023-10-09 DIAGNOSIS — I214 Non-ST elevation (NSTEMI) myocardial infarction: Secondary | ICD-10-CM

## 2023-10-12 ENCOUNTER — Encounter (HOSPITAL_COMMUNITY)
Admission: RE | Admit: 2023-10-12 | Discharge: 2023-10-12 | Disposition: A | Discharge status: Home / Self Care | Payer: Medicare Other | Source: Ambulatory Visit | Attending: Cardiology

## 2023-10-12 DIAGNOSIS — I214 Non-ST elevation (NSTEMI) myocardial infarction: Secondary | ICD-10-CM

## 2023-10-12 LAB — GLUCOSE, CAPILLARY: Glucose-Capillary: 233 mg/dL — ABNORMAL HIGH (ref 70–99)

## 2023-10-14 ENCOUNTER — Encounter (HOSPITAL_COMMUNITY)
Admission: RE | Admit: 2023-10-14 | Discharge: 2023-10-14 | Disposition: A | Discharge status: Home / Self Care | Payer: Medicare Other | Source: Ambulatory Visit | Attending: Cardiology | Admitting: Cardiology

## 2023-10-14 DIAGNOSIS — I214 Non-ST elevation (NSTEMI) myocardial infarction: Secondary | ICD-10-CM

## 2023-10-16 ENCOUNTER — Encounter (HOSPITAL_COMMUNITY)
Admission: RE | Admit: 2023-10-16 | Discharge: 2023-10-16 | Disposition: A | Discharge status: Home / Self Care | Payer: Medicare Other | Source: Ambulatory Visit | Attending: Cardiology | Admitting: Cardiology

## 2023-10-16 DIAGNOSIS — I214 Non-ST elevation (NSTEMI) myocardial infarction: Secondary | ICD-10-CM | POA: Insufficient documentation

## 2023-10-19 ENCOUNTER — Encounter (HOSPITAL_COMMUNITY): Payer: Medicare Other

## 2023-10-20 ENCOUNTER — Encounter (HOSPITAL_BASED_OUTPATIENT_CLINIC_OR_DEPARTMENT_OTHER): Payer: Self-pay

## 2023-10-20 ENCOUNTER — Ambulatory Visit: Payer: Medicare Other | Admitting: Podiatry

## 2023-10-20 ENCOUNTER — Encounter: Payer: Self-pay | Admitting: Podiatry

## 2023-10-20 DIAGNOSIS — M79674 Pain in right toe(s): Secondary | ICD-10-CM | POA: Diagnosis not present

## 2023-10-20 DIAGNOSIS — L03115 Cellulitis of right lower limb: Secondary | ICD-10-CM

## 2023-10-20 DIAGNOSIS — M79675 Pain in left toe(s): Secondary | ICD-10-CM

## 2023-10-20 DIAGNOSIS — E1151 Type 2 diabetes mellitus with diabetic peripheral angiopathy without gangrene: Secondary | ICD-10-CM | POA: Diagnosis not present

## 2023-10-20 DIAGNOSIS — B351 Tinea unguium: Secondary | ICD-10-CM | POA: Diagnosis not present

## 2023-10-20 DIAGNOSIS — L84 Corns and callosities: Secondary | ICD-10-CM

## 2023-10-20 MED ORDER — DOXYCYCLINE HYCLATE 100 MG PO CAPS
100.0000 mg | ORAL_CAPSULE | Freq: Two times a day (BID) | ORAL | 0 refills | Status: AC
Start: 2023-10-20 — End: 2023-10-27

## 2023-10-20 NOTE — Progress Notes (Unsigned)
Subjective:  Patient ID: [name], male    DOB: [birth year],  MRN: [id number]  [name] presents to clinic today for:  Chief Complaint  Patient presents with   Diabetes    PATIENT STATES THAT HE HAS A IN GROWN TOENAIL ON RF LEFT SIDE HALLUX,  PATIENT STATES THAT HIS LF HALLUX TOE NAIL BROKE THAT HAPPEN LAST WEEKEND ,  PATIENT ALSO STATES THAT ON THE BOTTOM OF HIS RF HE HAS A CALLOUSES  AND IT HAS BEEN THERE FOR 3 YEARS AND HURTS HIM BAD . PATIENT STATES HE IS NOT IN NO PAIN RIGHT NOW .    Patient notes nails are thick and elongated, causing pain in shoe gear when ambulating.  Right submet 5 painful callus with discoloration.  He has his diabetic shoes and inserts, but the right insert does not have the callus properly off-loaded.   PCP is [place], [name] G, MD.  Last seen 06/12/23.  Past Medical History:  Diagnosis Date   A-fib (HCC) 03/04/2021   Anemia    Arthritis    Cancer (HCC)    prostate   Chronic kidney disease    blood in urine    Constipation    Coronary artery disease    Depression    Diabetes mellitus without complication (HCC)    Difficult intubation    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 it was hard to get the tube down his throat   Dyspnea    Family history of breast cancer    Fatty liver    Fatty liver    Frequent headaches    GERD (gastroesophageal reflux disease)    History of chicken pox    History of fainting spells of unknown cause    History of prostate cancer    Hyperlipidemia    Hypertension    Myocardial infarction (HCC)    Peripheral neuropathy    Pneumonia    Prostate cancer (HCC)    PTSD (post-traumatic stress disorder)    Sleep apnea    uses Cpap    Allergies  Allergen Reactions   Keflex [Cephalexin] Other (See Comments)    Hallucinations?   Lisinopril Other (See Comments)    Unknown   Terazosin Other (See Comments)    Unknown    Objective:  Son Barkan is a pleasant 77 y.o. male in NAD. AAO x  3.  Vascular Examination: Patient has palpable DP pulse, absent PT pulse bilateral.  Delayed capillary refill bilateral toes.  Sparse digital hair bilateral.  Proximal to distal cooling WNL bilateral.    Dermatological Examination: Interspaces are clear with no open lesions noted bilateral.  Skin is shiny and atrophic bilateral.  Right hallux medial border slightly incurvated with pain on palpation of distal edge.  No drainage or erythema.  Left hallux nail is 3mm thick, with subungal debris and dry subungual hematoma noted, distal onycholysis and pain with palpation.  Nail/bed is stable and dry.  There is pain with compression of nails x2.  There are hyperkeratotic lesions noted right submet 5 with hemorrhage within the callus.  No ulceration noted centrally.  The area is warm, but minimal erythema present.     Latest Ref Rng & Units 06/09/2023    6:36 AM 12/10/2022   10:03 AM  Hemoglobin A1C  Hemoglobin-A1c 4.8 - 5.6 % 7.5  6.8    Patient qualifies for at-risk foot care because of diabetes with PVD .  Assessment/Plan: 1. Pain due to onychomycosis  of toenails of both feet   2. Type II diabetes mellitus with peripheral circulatory disorder (HCC)   3. Callus of foot   4. Cellulitis of right foot     Meds ordered this encounter  Medications   doxycycline (VIBRAMYCIN) 100 MG capsule    Sig: Take 1 capsule (100 mg total) by mouth 2 (two) times daily for 7 days.    Dispense:  14 capsule    Refill:  0   Mycotic nails x2 were sharply debrided with sterile nail nippers and power debriding burr to decrease bulk and length.  The right hallux medial nail border was cut back and antibiotic ointment applied.  Hyperkeratotic lesion x1 was shaved with #312 blade.  Rx doxycycline sent to patient's pharmacy for mild infection  in area.  His shoe insert on the right was further off-loaded with a sanding burr and then felt padding was placed around the submet 5 area to further offload the area.  He  noted immediate improvement upon weight bearing.   Return in about 2 weeks (around 11/03/2023) for recheck R foot.   Clerance Lav, DPM, FACFAS [facility]     [address] [place], [place] [address]                Office [phone]  Fax [phone]

## 2023-10-21 ENCOUNTER — Telehealth (HOSPITAL_COMMUNITY): Payer: Self-pay

## 2023-10-21 ENCOUNTER — Encounter (HOSPITAL_COMMUNITY): Payer: Medicare Other

## 2023-10-21 NOTE — Telephone Encounter (Signed)
Pt called and is not coming to class today. Pt has had a lot of leg and feet pain and is not getting enough rest at night. He has canceled today and Fridays appointment!

## 2023-10-23 ENCOUNTER — Encounter (HOSPITAL_COMMUNITY): Payer: Medicare Other

## 2023-10-26 ENCOUNTER — Telehealth (HOSPITAL_COMMUNITY): Payer: Self-pay

## 2023-10-26 ENCOUNTER — Encounter (HOSPITAL_COMMUNITY): Payer: Medicare Other

## 2023-10-26 NOTE — Telephone Encounter (Signed)
[name]  called and canceled his appointment for today due to his legs hurting still!

## 2023-10-27 ENCOUNTER — Encounter (HOSPITAL_BASED_OUTPATIENT_CLINIC_OR_DEPARTMENT_OTHER): Payer: Self-pay

## 2023-10-27 NOTE — Telephone Encounter (Signed)
Spoke with patient regarding symptoms  He has the tingling with exercise at cardiac rehab  Scheduled appointment with Dr Cristal Deer Friday, will bring his blood pressure machine to visit

## 2023-10-28 ENCOUNTER — Encounter (HOSPITAL_COMMUNITY): Payer: Medicare Other

## 2023-10-30 ENCOUNTER — Encounter (HOSPITAL_COMMUNITY): Payer: Medicare Other

## 2023-10-30 ENCOUNTER — Ambulatory Visit (INDEPENDENT_AMBULATORY_CARE_PROVIDER_SITE_OTHER): Payer: Medicare Other | Admitting: Family Medicine

## 2023-10-30 ENCOUNTER — Ambulatory Visit (HOSPITAL_BASED_OUTPATIENT_CLINIC_OR_DEPARTMENT_OTHER): Payer: Medicare Other | Admitting: Cardiology

## 2023-10-30 ENCOUNTER — Encounter: Payer: Self-pay | Admitting: Family Medicine

## 2023-10-30 ENCOUNTER — Encounter (HOSPITAL_BASED_OUTPATIENT_CLINIC_OR_DEPARTMENT_OTHER): Payer: Self-pay | Admitting: Cardiology

## 2023-10-30 VITALS — BP 120/68 | HR 70 | Resp 16 | Ht 71.0 in | Wt 251.5 lb

## 2023-10-30 VITALS — BP 122/66 | HR 71 | Ht 71.0 in | Wt 253.0 lb

## 2023-10-30 DIAGNOSIS — G629 Polyneuropathy, unspecified: Secondary | ICD-10-CM

## 2023-10-30 DIAGNOSIS — I480 Paroxysmal atrial fibrillation: Secondary | ICD-10-CM

## 2023-10-30 DIAGNOSIS — Z951 Presence of aortocoronary bypass graft: Secondary | ICD-10-CM | POA: Diagnosis not present

## 2023-10-30 DIAGNOSIS — F3341 Major depressive disorder, recurrent, in partial remission: Secondary | ICD-10-CM | POA: Diagnosis not present

## 2023-10-30 DIAGNOSIS — Z7901 Long term (current) use of anticoagulants: Secondary | ICD-10-CM

## 2023-10-30 DIAGNOSIS — I119 Hypertensive heart disease without heart failure: Secondary | ICD-10-CM | POA: Diagnosis not present

## 2023-10-30 DIAGNOSIS — I2510 Atherosclerotic heart disease of native coronary artery without angina pectoris: Secondary | ICD-10-CM

## 2023-10-30 DIAGNOSIS — I252 Old myocardial infarction: Secondary | ICD-10-CM

## 2023-10-30 DIAGNOSIS — N1831 Chronic kidney disease, stage 3a: Secondary | ICD-10-CM

## 2023-10-30 DIAGNOSIS — D6869 Other thrombophilia: Secondary | ICD-10-CM | POA: Diagnosis not present

## 2023-10-30 DIAGNOSIS — I200 Unstable angina: Secondary | ICD-10-CM

## 2023-10-30 DIAGNOSIS — I10 Essential (primary) hypertension: Secondary | ICD-10-CM | POA: Diagnosis not present

## 2023-10-30 MED ORDER — ISOSORBIDE MONONITRATE ER 30 MG PO TB24
30.0000 mg | ORAL_TABLET | Freq: Every day | ORAL | 3 refills | Status: DC
Start: 2023-10-30 — End: 2023-11-19

## 2023-10-30 MED ORDER — GABAPENTIN 300 MG PO CAPS
300.0000 mg | ORAL_CAPSULE | Freq: Every day | ORAL | 1 refills | Status: DC
Start: 2023-10-30 — End: 2023-11-06

## 2023-10-30 NOTE — Progress Notes (Signed)
ACUTE VISIT Chief Complaint  Patient presents with   burning legs    Aching & having to sleep in recliner   HPI: [name] is a 77 y.o. male with a PMHx significant for CAD, previous MI 2018, and history of CABG 2004, paroxysmal atrial fibrillation, HTN, HLD, CKD III, DM II, peripheral neuropathy, and OSA on CPAP, who is here today with his wife complaining of burning and aching sensation in his legs.   Patient complains of a burning sensation distal LE's, under knees, which has been going on for years. He says the pain is worsening, it is severe. His wife says the pain is worse at night, and is interfering with his sleep, but also says he occasionally has the pain during the day.  He has not seen any lesions or ulcers on the skin of his legs.  He has an appointment with podiatry on 11/18.  Pancreatic cancer ,completed chemo, which aggravated problem.  Diabetes Mellitus II:  - He is currently taking Jardiance 10 mg daily, Lantus 50 units daily, and Novolog 3 units at lunch and 5 units at dinner.  - Negative for symptoms of hypoglycemia, polyuria, polydipsia, foot ulcers/trauma  Lab Results  Component Value Date   HGBA1C 7.5 (H) 06/09/2023   He also complains of a needling chest pain on 11/7. He says he took som nitroglycerin and it dissipated.  He also mentions he has been having numbness in his arms and chest,radiated to neck and while doing his rehab exercises, so he hasn't been to rehab for 2 weeks.  He has also been having some SOB with exertion.  No associated diaphoresis or palpitations. He is on Atorvastatin 80 mg 1/2 tab daily. Atrial fib on Eliquis 5 mg bid. He has an appointment with cardiology today.  BP has been running high.  Hypertension: Currently on amlodipine 5 mg daily, hydralazine 50 mg 3x daily, and metoprolol tartrate 12.5 mg bid. He has had an adverse reaction to lisinopril before.  His readings have been 160/65-75 Negative for unusual or  severe headache, visual changes, focal weakness, or worsening edema. CKD III: He follows with nephrologist. His next appointment with nephrology is in 12/2023.  Lab Results  Component Value Date   CREATININE 1.54 (H) 09/07/2023   BUN 24 (H) 09/07/2023   NA 136 09/07/2023   K 3.5 09/07/2023   CL 100 09/07/2023   CO2 23 09/07/2023   Depression:  Patient also complains of some depressed mood. He says his many medical issues are making things more difficult.  He is currently on fluoxetine 20 mg 2 caps daily and trazodone 100 mg daily [facility].  He has a history of PTSD.  Review of Systems  Constitutional:  Negative for chills and fever.  Respiratory:  Negative for cough and wheezing.   Gastrointestinal:  Negative for abdominal pain, nausea and vomiting.  Endocrine: Negative for cold intolerance and heat intolerance.  Genitourinary:  Negative for decreased urine volume, dysuria and hematuria.  Musculoskeletal:  Positive for arthralgias and gait problem.  Skin:  Negative for rash.  Neurological:  Negative for syncope and facial asymmetry.  Psychiatric/Behavioral:  Negative for confusion and hallucinations.   See other pertinent positives and negatives in HPI.  Current Outpatient Medications on File Prior to Visit  Medication Sig Dispense Refill   acetaminophen (TYLENOL) 500 MG tablet Take 1,000 mg by mouth in the morning, at noon, and at bedtime.     amLODipine (NORVASC) 10  MG tablet Take 5 mg by mouth daily.     apixaban (ELIQUIS) 5 MG TABS tablet Take 5 mg by mouth 2 (two) times daily.     aspirin EC 81 MG tablet Take 1 tablet (81 mg total) by mouth daily then as directed by provider. Swallow whole. 90 tablet 3   atorvastatin (LIPITOR) 80 MG tablet Take 40 mg by mouth at bedtime.     Calcium Carbonate Antacid (TUMS PO) Take 2 tablets by mouth as needed (reflux).     calcium-vitamin D (OSCAL WITH D) 500-200 MG-UNIT tablet Take 1 tablet by mouth 2 (two) times daily.      CRANBERRY PO Take 1 tablet by mouth in the morning and at bedtime.     empagliflozin (JARDIANCE) 10 MG TABS tablet Take 1 tablet (10 mg total) by mouth daily. 90 tablet 3   ferrous sulfate 325 (65 FE) MG tablet Take 1 tablet (325 mg total) by mouth daily with breakfast. 90 tablet 2   FLUoxetine (PROZAC) 20 MG capsule Take 40 mg by mouth daily.     furosemide (LASIX) 40 MG tablet Take 1 tablet (40 mg total) by mouth daily. 90 tablet 3   glucose blood (ONETOUCH VERIO) test strip Use to test blood sugars 1-2 times daily. 200 each 12   hydrALAZINE (APRESOLINE) 50 MG tablet Take 1 tablet (50 mg total) by mouth 3 (three) times daily. 270 tablet 3   insulin aspart (NOVOLOG) 100 UNIT/ML injection Inject 3-5 Units into the skin See admin instructions. Inject 3 units at lunch time and 5 units at dinner     insulin glargine (LANTUS) 100 UNIT/ML injection Inject 0.5 mLs (50 Units total) into the skin daily. 15 mL 3   metoprolol tartrate (LOPRESSOR) 25 MG tablet TAKE 1/2 TABLET(12.5 MG) BY MOUTH TWICE DAILY 90 tablet 3   Multiple Vitamin (MULTI-VITAMINS) TABS Take 1 tablet by mouth daily.     nitroGLYCERIN (NITROSTAT) 0.3 MG SL tablet Place 0.3 mg under the tongue every 5 (five) minutes as needed for chest pain.     pantoprazole (PROTONIX) 40 MG tablet Take 1 tablet (40 mg total) by mouth daily. 90 tablet 3   Probiotic Product (PROBIOTIC ADVANCED PO) Take 1 capsule by mouth at bedtime.     tamsulosin (FLOMAX) 0.4 MG CAPS capsule Take 0.8 mg by mouth at bedtime.     traZODone (DESYREL) 100 MG tablet Take 100 mg by mouth at bedtime.     vitamin B-12 (CYANOCOBALAMIN) 1000 MCG tablet Take 1,000 mcg by mouth daily.     Wheat Dextrin (BENEFIBER DRINK MIX PO) Take 1 Scoop by mouth at bedtime.     No current facility-administered medications on file prior to visit.    Past Medical History:  Diagnosis Date   A-fib (HCC) 03/04/2021   Anemia    Arthritis    Cancer (HCC)    prostate   Chronic kidney disease     blood in urine    Constipation    Coronary artery disease    Depression    Diabetes mellitus without complication (HCC)    Difficult intubation    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 it was hard to get the tube down his throat   Dyspnea    Family history of breast cancer    Fatty liver    Fatty liver    Frequent headaches    GERD (gastroesophageal reflux disease)    History of chicken pox    History of fainting spells of  unknown cause    History of prostate cancer    Hyperlipidemia    Hypertension    Myocardial infarction [facility])    Peripheral neuropathy    Pneumonia    Prostate cancer (HCC)    PTSD (post-traumatic stress disorder)    Sleep apnea    uses Cpap   Allergies  Allergen Reactions   Keflex [Cephalexin] Other (See Comments)    Hallucinations?   Lisinopril Other (See Comments)    Unknown   Terazosin Other (See Comments)    Unknown    Social History   Socioeconomic History   Marital status: Married    Spouse name: [name]   Number of children: 2   Years of education: Not on file   Highest education level: Some college, no degree  Occupational History   Occupation: retired  Tobacco Use   Smoking status: Former    Current packs/day: 0.00    Types: Cigarettes    Quit date: 12/14/1975    Years since quitting: 47.9   Smokeless tobacco: Never  Vaping Use   Vaping status: Not on file  Substance and Sexual Activity   Alcohol use: Not Currently   Drug use: Never   Sexual activity: Not Currently  Other Topics Concern   Not on file  Social History Narrative   ** Merged History Encounter **       Social Determinants of Health   Financial Resource Strain: Low Risk  (10/29/2023)   Overall Financial Resource Strain (CARDIA)    Difficulty of Paying Living Expenses: Not hard at all  Food Insecurity: No Food Insecurity (10/29/2023)   Hunger Vital Sign    Worried About Running Out of Food in the Last Year: Never true    Ran Out of Food in the Last Year: Never true   Transportation Needs: No Transportation Needs (10/29/2023)   PRAPARE - Administrator, Civil Service (Medical): No    Lack of Transportation (Non-Medical): No  Physical Activity: Insufficiently Active (10/29/2023)   Exercise Vital Sign    Days of Exercise per Week: 2 days    Minutes of Exercise per Session: 30 min  Stress: Stress Concern Present (10/29/2023)   [employer] of Occupational Health - Occupational Stress Questionnaire    Feeling of Stress : To some extent  Social Connections: Socially Integrated (10/29/2023)   Social Connection and Isolation Panel [NHANES]    Frequency of Communication with Friends and Family: Twice a week    Frequency of Social Gatherings with Friends and Family: Three times a week    Attends Religious Services: More than 4 times per year    Active Member of Clubs or Organizations: Yes    Attends Banker Meetings: More than 4 times per year    Marital Status: Married   Vitals:   10/30/23 0954  BP: 120/68  Pulse: 70  Resp: 16  SpO2: 96%   Body mass index is 35.08 kg/m.  Physical Exam Vitals and nursing note reviewed.  Constitutional:      General: He is not in acute distress.    Appearance: He is well-developed.  HENT:     Head: Normocephalic and atraumatic.  Eyes:     Conjunctiva/sclera: Conjunctivae normal.  Cardiovascular:     Rate and Rhythm: Normal rate and regular rhythm.     Heart sounds: No murmur heard.    Comments: Palpable DP pulses. Pulmonary:     Effort: Pulmonary effort is normal. No respiratory distress.  Breath sounds: Normal breath sounds.  Abdominal:     Palpations: Abdomen is soft. There is no mass.     Tenderness: There is no abdominal tenderness.  Musculoskeletal:     Right lower leg: 1+ Pitting Edema present.     Left lower leg: 1+ Pitting Edema present.  Feet:     Right foot:     Skin integrity: Callus present. No ulcer.     Left foot:     Skin integrity: No ulcer.  Skin:     General: Skin is warm.     Findings: No erythema or rash.  Neurological:     Mental Status: He is alert and oriented to person, place, and time.     Cranial Nerves: No cranial nerve deficit.     Comments: Unstable gait assisted with a cane.  Psychiatric:        Mood and Affect: Mood and affect normal.        Thought Content: Thought content does not include homicidal or suicidal ideation. Thought content does not include homicidal or suicidal plan.   ASSESSMENT AND PLAN:  Mr. Grajales was seen today for burning and aching sensation of his legs.   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 We discussed Dx,prognosis,and treatment options. Stressed the importance of adequate glucose controlled and appropriate foot care. Gabapentin 300 mg at bedtime started today, side effects discussed. Fall precautions discussed.  Orders: -     Gabapentin; Take 1 capsule (300 mg total) by mouth at bedtime.  Dispense: 30 capsule; Refill: 1  Hypertension with heart disease Assessment & Plan: Today BP adequately controlled, reporting SBP in 160s at home. Continue amlodipine 10 mg 1/2 tablet daily, hydralazine 50 mg 3 times daily, and metoprolol titrate 25 mg twice daily.  We discussed options: Adding a ARB versus increasing dose of metoprolol titrate to better control BP but since he has an appointment with his cardiologist today, I am holding on any change for now. Continue monitoring BP regularly. Continue low-salt diet.   Unstable angina [facility]) Assessment & Plan: Reporting episodes of chest and upper extremities discomfort with exertion and radiated to neck. Currently on atorvastatin 80 mg 1/2 tablet daily, amlodipine, and metoprolol titrate. He takes nitro sublingual as needed and it has helped. Stressed the importance of aggressive management of CV risk factors. Seeing cardiologist today.   Recurrent major depressive disorder, in partial  remission [facility]) Assessment & Plan: Due to recent health complications he feels more depressed lately. He is currently on Fluoxetine 40 mg daily and takes Trazodone 100 mg at bedtime. Follows at the [place] annually, recommend arranging a f/u appt. Instructed about warning signs.   Return if symptoms worsen or fail to improve, for keep next appointment.  I, Rolla Etienne Wierda, acting as a scribe for Abdalrahman Clementson [place], MD., have documented all relevant documentation on the behalf of Kemesha Mosey [place], MD, as directed by  Mathayus Stanbery [place], MD while in the presence of Wyndi Northrup [place], MD.   I, Corean Yoshimura [place], MD, have reviewed all documentation for this visit. The documentation on 10/30/23 for the exam, diagnosis, procedures, and orders are all accurate and complete.  Elison Worrel G. [place], MD  [facility]. [place] office.

## 2023-10-30 NOTE — Progress Notes (Signed)
Cardiology Office Note:  .    Date:  10/30/2023  ID:  [name], DOB Jun 27, 1946, MRN [id number] PCP: [name], Bill G, MD  [facility] Providers Cardiologist:  Bill Red, MD     History of Present Illness: .    [name] is a 77 y.o. male with a hx of atrial fibrillation, myocardial infarction, coronary artery disease s/p CABG, hypertension, hyperlipidemia, dyspnea, sleep apnea, chronic kidney disease, diabetes mellitus with peripheral neuropathy, who is seen for follow up today.    At his visit 05/2023, he was seen urgently for shortness of breath. He was extremely tachypneic on arrival, RR in the 30s. O2 sat 97. Complained of bilateral arm pressure on and off for a few weeks. Had taken NG multiple times with some relief but not complete resolution. Had 10 lb weight gain over one month with mild increase in LE edema. I had him take two of his nitroglycerin while in the room with me. This relieved some of his discomfort but did not completely stop it. He was sent to the ER. On arrival his EKG showed no acute ischemic changes. Troponins were elevated and peaked at 201. BNP was 347. Echo showed LVEF 50-55% with global hypokinesis, vascular congestion. He was started on Jardiance and received diuresis. Valsartan and hydralazine were discontinued given AKI and normotension. Subsequently underwent cardiac catheterization 06/09/2023 for NSTEMI, showing patent LIMA to LAD vein graft to PDA. Vein graft to ramus occluded. He had severe disease of the ostial LAD and had an occluded proximal left circumflex and known occluded ostial right coronary artery, with plans to medically manage. He was also discharged on 40 mg Lasix.  Today: Has numbness that comes on, posterior of both arms, across chest, up back of neck. Causes a headache. Gets better when he takes nitroglycerin. Happening about once a week when in cardiac rehab, happens less frequently when he is home.  Noticed most frequently when he was using the nu-step (works arms and legs) at cardiac rehab. Most recent episode occurred while he was sitting in chair, lasted about 5 minutes. Other than a headache, no other associated symptoms.  Also has had focal anterior chest pain, like a pin stuck in it. Lasted a few minutes and then went away.  We spent extensive time today discussing types of chest pain and management, see below.  ROS:  Please see the history of present illness. ROS otherwise negative except as noted.   Studies Reviewed: Marland Kitchen    EKG Interpretation Date/Time:  Friday October 30 2023 16:34:53 EST Ventricular Rate:  72 PR Interval:  192 QRS Duration:  78 QT Interval:  430 QTC Calculation: 470 R Axis:   -33  Text Interpretation: Sinus rhythm with occasional Premature ventricular complexes and Premature atrial complexes Left axis deviation Anterior infarct (cited on or before 30-Oct-2023) Confirmed by [name] [phone]) on 10/30/2023 5:02:27 PM    Physical Exam:    VS:  BP 122/66 (BP Location: Right Arm) Comment: pt cuff  Pulse 71   Ht 5\' 11"  (1.803 m)   Wt 253 lb (114.8 kg)   SpO2 94%   BMI 35.29 kg/m    Wt Readings from Last 3 Encounters:  10/30/23 253 lb (114.8 kg)  10/30/23 251 lb 8 oz (114.1 kg)  09/16/23 258 lb (117 kg)    GEN: Well nourished, well developed in no acute distress HEENT: Normal, moist mucous membranes NECK: No JVD CARDIAC: regular rhythm, normal S1 and S2, no rubs  or gallops. No murmur. VASCULAR: Radial and DP pulses 2+ bilaterally. No carotid bruits RESPIRATORY:  Clear to auscultation without rales, wheezing or rhonchi  ABDOMEN: Soft, non-tender, non-distended MUSCULOSKELETAL:  Ambulates independently SKIN: Warm and dry, no edema NEUROLOGIC:  Alert and oriented x 3. No focal neuro deficits noted. PSYCHIATRIC:  Normal affect    ASSESSMENT AND PLAN: .    Bilateral arm and chest numbness -These were not his prior symptoms during his  NSTEMI, and they are atypical for anginal symptoms. However, they are nitroglycerin responsive. -we discussed both cardiac (angina) and noncardiac (MSK, neuro) etiologies for these sensations. He does have significant neuropathy and is planning to start gabapentin tonight. As these are largely (though not always) related to activity where he is using his arms, neuro is a leading candidate, though unclear why it would be nitroglycerin responsive -we discussed that if it is angina, even atypical, we already know his anatomy and know that he has no targets, so further testing with stress test/cath unlikely to change recommendations for management, which would be with medications -he is already on amlodipine and metoprolol as antianginals -discussed imdur, pros/cons today. He is willing to try -will start low dose and titrate up. Given the following instructions: START Imdur 30mg  for 7 days (see tritration below) Week 1: if SBP is more than 110 and symptoms remain present, you may increase to 60 mg for 7 days Week 2: if SBP is more than 110 and symptoms remain present, you may increase to 90 mg for 7 days Week 3: if SBP is more than 110 and symptoms remain present, you may increase to 120 mg for 7 days  Week 4: if still no relief, please let [name] know!  -ok to return to cardiac rehab -counseled on high risk/[name] flag signs that need immediate medical attention  Recent NSTEMI CAD s/p prior CABG -cath showed SVG to ramus was now occluded. Recommended medical management -now in cardiac rehab -EF 50-55% -continue aspirin, atorvastatin. Not on DAPT as he is on apixaban -see prior note. Had UTIs on Jardiance, reached out to Dr. [place] and Dr. Ronalee Belts re: stopping SGLT2i and potentially starting GLP1RA after last visit  Hypertension Chronic kidney disease stage 3a -see SGLT2i as above -continue amlodipine -lasix daily, appears euvolemic  Paroxysmal atrial fibrillation -CHA2DS2/VAS Stroke Risk  Points= 6  -continue apixaban  Type II diabetes, on insulin -see SGLT2i vs GLP as above  Dispo: keep appt in January  Total time of encounter: I spent 45 minutes dedicated to the care of this patient on the date of this encounter to include pre-visit review of records, face-to-face time with the patient discussing conditions above, and clinical documentation with the electronic health record. We specifically spent time today discussing causes of chest pain and options for evaluation and management, as noted above.  Bill Red, MD, PhD, [facility]  [facility]  [place]  [facility] at [facility] [address] [place], [place] [address] [phone]    Signed, Bill Red, MD

## 2023-10-30 NOTE — Patient Instructions (Signed)
A few things to remember from today's visit:  Peripheral polyneuropathy - Plan: gabapentin (NEURONTIN) 300 MG capsule  Hypertension with heart disease  Unstable angina (HCC)  Gabapentin 300 mg added today for burning legs sensation. Please let me know in 10 days of it is helping. Fall precautions. [facility]  If you need refills for medications you take chronically, please call your pharmacy. Do not use My Chart to request refills or for acute issues that need immediate attention. If you send a my chart message, it may take a few days to be addressed, specially if I am not in the office.  Please be sure medication list is accurate. If a new problem present, please set up appointment sooner than planned today.

## 2023-10-30 NOTE — Assessment & Plan Note (Signed)
Today BP adequately controlled, reporting SBP in 160s at home. Continue amlodipine 10 mg 1/2 tablet daily, hydralazine 50 mg 3 times daily, and metoprolol titrate 25 mg twice daily.  We discussed options: Adding a ARB versus increasing dose of metoprolol titrate to better control BP but since he has an appointment with his cardiologist today, I am holding on any change for now. Continue monitoring BP regularly. Continue low-salt diet.

## 2023-10-30 NOTE — Patient Instructions (Addendum)
Medication Instructions:  Your physician has recommended you make the following change in your medication:  START Imdur 30mg  for 7 days (see tritration below) Week 1: if SBP is more than 110 and symptoms remain present, you may increase to 60 mg for 7 days Week 2: if SBP is more than 110 and symptoms remain present, you may increase to 90 mg for 7 days Week 3: if SBP is more than 110 and symptoms remain present, you may increase to 120 mg for 7 days  Week 4: if still no relief, please let us know!  *If you need a refill on your cardiac medications before your next appointment, please call your pharmacy*   Follow-Up: At [facility], you and your health needs are our priority.  As part of our continuing mission to provide you with exceptional heart care, we have created designated Provider Care Teams.  These Care Teams include your primary Cardiologist (physician) and Advanced Practice Providers (APPs -  Physician Assistants and Nurse Practitioners) who all work together to provide you with the care you need, when you need it.  We recommend signing up for the patient portal called "MyChart".  Sign up information is provided on this After Visit Summary.  MyChart is used to connect with patients for Virtual Visits (Telemedicine).  Patients are able to view lab/test results, encounter notes, upcoming appointments, etc.  Non-urgent messages can be sent to your provider as well.   To learn more about what you can do with MyChart, go to [email or web address].    Your next appointment:   Follow up as scheduled

## 2023-10-30 NOTE — Assessment & Plan Note (Addendum)
Reporting episodes of chest and upper extremities discomfort with exertion and radiated to neck. Currently on atorvastatin 80 mg 1/2 tablet daily, amlodipine, and metoprolol titrate. He takes nitro sublingual as needed and it has helped. Stressed the importance of aggressive management of CV risk factors. Seeing cardiologist today.

## 2023-10-31 NOTE — Assessment & Plan Note (Signed)
Due to recent health complications he feels more depressed lately. He is currently on Fluoxetine 40 mg daily and takes Trazodone 100 mg at bedtime. Follows at the [place] annually, recommend arranging a f/u appt. Instructed about warning signs.

## 2023-10-31 NOTE — Assessment & Plan Note (Signed)
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 We discussed Dx,prognosis,and treatment options. Stressed the importance of adequate glucose controlled and appropriate foot care. Gabapentin 300 mg at bedtime started today, side effects discussed. Fall precautions discussed.

## 2023-11-02 ENCOUNTER — Encounter (HOSPITAL_COMMUNITY): Payer: Medicare Other

## 2023-11-02 ENCOUNTER — Ambulatory Visit (INDEPENDENT_AMBULATORY_CARE_PROVIDER_SITE_OTHER): Payer: Medicare Other | Admitting: Podiatry

## 2023-11-02 ENCOUNTER — Encounter: Payer: Self-pay | Admitting: Podiatry

## 2023-11-02 VITALS — Ht 71.0 in | Wt 253.0 lb

## 2023-11-02 DIAGNOSIS — L84 Corns and callosities: Secondary | ICD-10-CM | POA: Diagnosis not present

## 2023-11-02 DIAGNOSIS — M216X9 Other acquired deformities of unspecified foot: Secondary | ICD-10-CM

## 2023-11-02 NOTE — Progress Notes (Signed)
Chief Complaint  Patient presents with   Foot Pain    Pt is here to f/u on left foot pt states foot feels fine.   HPI: 77 y.o. male presents today for recheck of a preulcerative callus right submet 5.  He notes that he did not immediately start taking his antibiotics after he was last seen and he was having severe pain in the foot which she felt was starting to affect his heart in a regular way.  He did finish all the antibiotics and does have improvement overall.  He has there is still tenderness to the area today.  Past Medical History:  Diagnosis Date   A-fib (HCC) 03/04/2021   Anemia    Arthritis    Cancer (HCC)    prostate   Chronic kidney disease    blood in urine    Constipation    Coronary artery disease    Depression    Diabetes mellitus without complication (HCC)    Difficult intubation    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 it was hard to get the tube down his throat   Dyspnea    Family history of breast cancer    Fatty liver    Fatty liver    Frequent headaches    GERD (gastroesophageal reflux disease)    History of chicken pox    History of fainting spells of unknown cause    History of prostate cancer    Hyperlipidemia    Hypertension    Myocardial infarction [facility])    Peripheral neuropathy    Pneumonia    Prostate cancer (HCC)    PTSD (post-traumatic stress disorder)    Sleep apnea    uses Cpap    Past Surgical History:  Procedure Laterality Date   APPENDECTOMY     BIOPSY  01/15/2021   Procedure: BIOPSY;  Surgeon: Lemar Lofty., MD;  Location: [facility] ENDOSCOPY;  Service: Gastroenterology;;   CARDIAC CATHETERIZATION  06/26/2017   CARDIAC SURGERY     Triple Bypass   CHOLECYSTECTOMY  2010   COLONOSCOPY     CORONARY ARTERY BYPASS GRAFT  2004   ESOPHAGOGASTRODUODENOSCOPY (EGD) WITH PROPOFOL N/A 01/15/2021   Procedure: ESOPHAGOGASTRODUODENOSCOPY (EGD) WITH PROPOFOL;  Surgeon: Lemar Lofty., MD;  Location: [facility] ENDOSCOPY;  Service: Gastroenterology;   Laterality: N/A;   EUS N/A 01/15/2021   Procedure: UPPER ENDOSCOPIC ULTRASOUND (EUS) RADIAL;  Surgeon: Lemar Lofty., MD;  Location: [facility] ENDOSCOPY;  Service: Gastroenterology;  Laterality: N/A;   EYE SURGERY Bilateral    cataract removal   FINE NEEDLE ASPIRATION  01/15/2021   Procedure: FINE NEEDLE ASPIRATION (FNA) LINEAR;  Surgeon: Lemar Lofty., MD;  Location[facility] ENDOSCOPY;  Service: Gastroenterology;;   LAPAROSCOPY N/A 07/01/2021   Procedure: STAGING LAPAROSCOPY;  Surgeon: Fritzi Mandes, MD;  Location: [facility] OR;  Service: General;  Laterality: N/A;   LEFT HEART CATH AND CORS/GRAFTS ANGIOGRAPHY N/A 06/09/2023   Procedure: LEFT HEART CATH AND CORS/GRAFTS ANGIOGRAPHY;  Surgeon: [name], MD;  Location: [facility] INVASIVE CV LAB;  Service: Cardiovascular;  Laterality: N/A;   PORTACATH PLACEMENT Right 02/21/2021   Procedure: INSERTION PORT-A-CATH;  Surgeon: Fritzi Mandes, MD;  Location: [facility] OR;  Service: General;  Laterality: Right;   SPLENECTOMY, 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   Procedure: SPLENECTOMY;  Surgeon: Fritzi Mandes, MD;  Location: [facility] OR;  Service: General;  Laterality: N/A;   TONSILLECTOMY  1958    Allergies  Allergen Reactions   Keflex [Cephalexin] Other (See Comments)  Hallucinations?   Lisinopril Other (See Comments)    Unknown   Terazosin Other (See Comments)    Unknown   Physical Exam: Trace palpable pedal pulses right foot.  There is a hyperkeratotic lesion with minimal hemorrhagic callus in the center right submet 5.  There is pain on palpation of the lesion.  No surrounding erythema or calor or edema is noted.  On inspection of his insert does seem like the top-cover has some type of reinforcement that is not allowing the high-pressure area submet 5 to sit lower.  The felt offloading pad is in place.   Assessment/Plan of Care: 1. Plantar flexed metatarsal, unspecified laterality   2. Pre-ulcerative calluses     Discussed clinical findings with patient  today.  The preulcerative callus was shaved with sterile #[id number] blade uneventfully.  No ulcer was revealed at this time.  His insert was taken over to our shoe department and a heat gun was utilized to try to repeat mold a depression through the top-cover to further offload right submet 5.  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   Clerance Lav, DPM, FACFAS [facility]     [address] [place], [place] [address]                Office [phone]  Fax [phone]

## 2023-11-03 ENCOUNTER — Encounter (HOSPITAL_COMMUNITY): Payer: Self-pay | Admitting: *Deleted

## 2023-11-03 DIAGNOSIS — I214 Non-ST elevation (NSTEMI) myocardial infarction: Secondary | ICD-10-CM

## 2023-11-03 NOTE — Progress Notes (Signed)
Cardiac Individual Treatment Plan  Patient Details  Name: [name] MRN: [id number] Date of Birth: 04/28/1946 Referring Provider:   Flowsheet Row INTENSIVE CARDIAC REHAB ORIENT from 08/11/2023 in [facility] for Heart, Vascular, & Lung Health  Referring Provider Jodelle Red, MD       Initial Encounter Date:  Flowsheet Row INTENSIVE CARDIAC REHAB ORIENT from 08/11/2023 in [facility] for Heart, Vascular, & Lung Health  Date 08/11/23       Visit Diagnosis: 06/08/23 NSTEMI (non-ST elevated myocardial infarction) [facility])  Patient's Home Medications on Admission:  Current Outpatient Medications:    acetaminophen (TYLENOL) 500 MG tablet, Take 1,000 mg by mouth in the morning, at noon, and at bedtime., Disp: , Rfl:    amLODipine (NORVASC) 10 MG tablet, Take 5 mg by mouth daily., Disp: , Rfl:    apixaban (ELIQUIS) 5 MG TABS tablet, Take 5 mg by mouth 2 (two) times daily., Disp: , Rfl:    aspirin EC 81 MG tablet, Take 1 tablet (81 mg total) by mouth daily then as directed by provider. Swallow whole., Disp: 90 tablet, Rfl: 3   atorvastatin (LIPITOR) 80 MG tablet, Take 40 mg by mouth at bedtime., Disp: , Rfl:    Calcium Carbonate Antacid (TUMS PO), Take 2 tablets by mouth as needed (reflux)., Disp: , Rfl:    calcium-vitamin D (OSCAL WITH D) 500-200 MG-UNIT tablet, Take 1 tablet by mouth 2 (two) times daily., Disp: , Rfl:    CRANBERRY PO, Take 1 tablet by mouth in the morning and at bedtime., Disp: , Rfl:    empagliflozin (JARDIANCE) 10 MG TABS tablet, Take 1 tablet (10 mg total) by mouth daily., Disp: 90 tablet, Rfl: 3   ferrous sulfate 325 (65 FE) MG tablet, Take 1 tablet (325 mg total) by mouth daily with breakfast., Disp: 90 tablet, Rfl: 2   FLUoxetine (PROZAC) 20 MG capsule, Take 40 mg by mouth daily., Disp: , Rfl:    furosemide (LASIX) 40 MG tablet, Take 1 tablet (40 mg total) by mouth daily., Disp: 90 tablet, Rfl: 3    gabapentin (NEURONTIN) 300 MG capsule, Take 1 capsule (300 mg total) by mouth at bedtime., Disp: 30 capsule, Rfl: 1   glucose blood (ONETOUCH VERIO) test strip, Use to test blood sugars 1-2 times daily., Disp: 200 each, Rfl: 12   hydrALAZINE (APRESOLINE) 50 MG tablet, Take 1 tablet (50 mg total) by mouth 3 (three) times daily., Disp: 270 tablet, Rfl: 3   insulin aspart (NOVOLOG) 100 UNIT/ML injection, Inject 3-5 Units into the skin See admin instructions. Inject 3 units at lunch time and 5 units at dinner, Disp: , Rfl:    insulin glargine (LANTUS) 100 UNIT/ML injection, Inject 0.5 mLs (50 Units total) into the skin daily., Disp: 15 mL, Rfl: 3   isosorbide mononitrate (IMDUR) 30 MG 24 hr tablet, Take 1 tablet (30 mg total) by mouth daily. See titration instructions on after visit summary, Disp: 90 tablet, Rfl: 3   metoprolol tartrate (LOPRESSOR) 25 MG tablet, TAKE 1/2 TABLET(12.5 MG) BY MOUTH TWICE DAILY, Disp: 90 tablet, Rfl: 3   Multiple Vitamin (MULTI-VITAMINS) TABS, Take 1 tablet by mouth daily., Disp: , Rfl:    nitroGLYCERIN (NITROSTAT) 0.3 MG SL tablet, Place 0.3 mg under the tongue every 5 (five) minutes as needed for chest pain., Disp: , Rfl:    pantoprazole (PROTONIX) 40 MG tablet, Take 1 tablet (40 mg total) by mouth daily., Disp: 90 tablet, Rfl: 3  Probiotic Product (PROBIOTIC ADVANCED PO), Take 1 capsule by mouth at bedtime., Disp: , Rfl:    tamsulosin (FLOMAX) 0.4 MG CAPS capsule, Take 0.8 mg by mouth at bedtime., Disp: , Rfl:    traZODone (DESYREL) 100 MG tablet, Take 100 mg by mouth at bedtime., Disp: , Rfl:    vitamin B-12 (CYANOCOBALAMIN) 1000 MCG tablet, Take 1,000 mcg by mouth daily., Disp: , Rfl:    Wheat Dextrin (BENEFIBER DRINK MIX PO), Take 1 Scoop by mouth at bedtime., Disp: , Rfl:   Past Medical History: Past Medical History:  Diagnosis Date   A-fib (HCC) 03/04/2021   Anemia    Arthritis    Cancer (HCC)    prostate   Chronic kidney disease    blood in urine     Constipation    Coronary artery disease    Depression    Diabetes mellitus without complication (HCC)    Difficult intubation    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 it was hard to get the tube down his throat   Dyspnea    Family history of breast cancer    Fatty liver    Fatty liver    Frequent headaches    GERD (gastroesophageal reflux disease)    History of chicken pox    History of fainting spells of unknown cause    History of prostate cancer    Hyperlipidemia    Hypertension    Myocardial infarction (HCC)    Peripheral neuropathy    Pneumonia    Prostate cancer (HCC)    PTSD (post-traumatic stress disorder)    Sleep apnea    uses Cpap    Tobacco Use: Social History   Tobacco Use  Smoking Status Former   Current packs/day: 0.00   Types: Cigarettes   Quit date: 12/14/1975   Years since quitting: 47.9  Smokeless Tobacco Never    Labs: Review Flowsheet  More data exists      Latest Ref Rng & Units 02/21/2022 04/08/2022 07/07/2022 12/10/2022 06/09/2023  Labs for ITP Cardiac and Pulmonary Rehab  Cholestrol 0 - 200 mg/dL 161  - - - 95   LDL (calc) 0 - 99 mg/dL 66  - - - 47   HDL-C >09 mg/dL 60.45  - - - 40   Trlycerides <150 mg/dL 409.8  - - - 42   Hemoglobin A1c 4.8 - 5.6 % - 9.9  6.8  6.8  7.5     Details            Capillary Blood Glucose: Lab Results  Component Value Date   GLUCAP 233 (H) 10/12/2023   GLUCAP 159 (H) 09/07/2023   GLUCAP 111 (H) 08/24/2023   GLUCAP 148 (H) 08/24/2023   GLUCAP 143 (H) 08/19/2023     Exercise Target Goals: Exercise Program Goal: Individual exercise prescription set using results from initial 6 min walk test and THRR while considering  patient's activity barriers and safety.   Exercise Prescription Goal: Initial exercise prescription builds to 30-45 minutes a day of aerobic activity, 2-3 days per week.  Home exercise guidelines will be given to patient during program as part of exercise prescription that the participant will  acknowledge.  Activity Barriers & Risk Stratification:   6 Minute Walk:   Oxygen Initial Assessment:   Oxygen Re-Evaluation:   Oxygen Discharge (Final Oxygen Re-Evaluation):   Initial Exercise Prescription:   Perform Capillary Blood Glucose checks as needed.  Exercise Prescription Changes:   Exercise Prescription Changes  Row Name 09/04/23 1613 09/18/23 1641 10/05/23 1654 10/16/23 1617       Response to Exercise   Blood Pressure (Admit) 122/62 140/68 126/64 136/74    Blood Pressure (Exercise) 128/78 122/56 -- --    Blood Pressure (Exit) 124/68 110/62 112/70 114/58    Heart Rate (Admit) 73 bpm 69 bpm 70 bpm 71 bpm    Heart Rate (Exercise) 91 bpm 101 bpm 111 bpm 97 bpm    Heart Rate (Exit) 66 bpm 65 bpm 66 bpm 63 bpm    Rating of Perceived Exertion (Exercise) 11 11 13 13     Perceived Dyspnea (Exercise) 0 0 2 0    Symptoms -- -- Seated rest breaks and SOB, resolved with rest Some fatigue pain in arms and legs, patient was pushing too hard, talked about moderation in exercise intensity    Comments REVD MET's, goals and home ExRx REVD MET's REVD MET's and goals REVD MET's    Duration Progress to 30 minutes of  aerobic without signs/symptoms of physical distress Progress to 30 minutes of  aerobic without signs/symptoms of physical distress Progress to 30 minutes of  aerobic without signs/symptoms of physical distress Progress to 30 minutes of  aerobic without signs/symptoms of physical distress    Intensity THRR unchanged THRR unchanged THRR unchanged THRR unchanged      Progression   Progression Continue to progress workloads to maintain intensity without signs/symptoms of physical distress. Continue to progress workloads to maintain intensity without signs/symptoms of physical distress. Continue to progress workloads to maintain intensity without signs/symptoms of physical distress. Continue to progress workloads to maintain intensity without signs/symptoms of physical  distress.    Average METs 3 2.7 2.74 4      Resistance Training   Training Prescription Yes Yes Yes Yes    Weight 3 lbs wts 3 lbs wts 3 lbs wts 3 lbs wts    Reps 10-15 10-15 10-15 10-15    Time 10 Minutes 10 Minutes 10 Minutes 10 Minutes      T5 Nustep   Level 3 3 4 4     SPM 89 -- -- 101    Minutes 25 15 15 23     METs 3 3 3.2 4      Track   Laps -- 11 10 --    Minutes -- 15 15 --    METs -- 2.4 2.28 --      Home Exercise Plan   Plans to continue exercise at Home (comment) Home (comment) Home (comment) Home (comment)    Frequency Add 2 additional days to program exercise sessions. Add 2 additional days to program exercise sessions. Add 2 additional days to program exercise sessions. Add 2 additional days to program exercise sessions.    Initial Home Exercises Provided 09/04/23 09/04/23 09/04/23 09/04/23             Exercise Comments:   Exercise Comments     Row Name 09/04/23 1618 09/18/23 1645 10/05/23 1701 10/16/23 1620 10/30/23 0944   Exercise Comments Reviewed MET's, goals and home ExRx. Pt tolerated exercise well with an average MET level of 3.0. Pt is doing well and progressing his MET's, he feels good with the progress he's making and feels good about his goals. He is increasing strength and stamina and feels he's able to do more with his grandson now. He will exercise on his own by walking and doing hand weights at home 1-2 days a week for 30 mins per session Pt first  day in the [facility] program. Pt tolerated exercise well with an average MET level of 2.7. He is very eager to get back to exercise and wanted to try adding a second modality today. Second modality was walking the track for 15 mins if tolerated, he did great and took breaks as needed but was able to complete 15 mmins. He had an episode of CP last week, but is feeling much better today. Reviewed MET's and goals. Pt tolerated exercise well with an average MET level of 2.74. Pt is feeling good about his goals  and is increasing leg strength and stamina. He is continuing to have INC SOB, but SaO2 ok at 96% Reviewed MET's. Pt tolerated exercise well with an average MET level of 4.0. Pt is increasing MET's, but is fatiguing and getting some pain in arms and legs from pushing intesities. Talked about moderation in intensity so that it is not too hard and causes pain Will review education when he returns, being evaluated by cardiology and foot doctor this week            Exercise Goals and Review:   Exercise Goals Re-Evaluation :  Exercise Goals Re-Evaluation     Row Name 09/04/23 1616 10/05/23 1658           Exercise Goal Re-Evaluation   Exercise Goals Review Increase Physical Activity;Understanding of Exercise Prescription;Increase Strength and Stamina;Knowledge and understanding of Target Heart Rate Range (THRR);Able to understand and use rate of perceived exertion (RPE) scale Increase Physical Activity;Understanding of Exercise Prescription;Increase Strength and Stamina;Knowledge and understanding of Target Heart Rate Range (THRR);Able to understand and use rate of perceived exertion (RPE) scale      Comments Reviewed MET's, goals and home ExRx. Pt tolerated exercise well with an average MET level of 3.0. Pt is doing well and progressing his MET's, he feels good with the progress he's making and feels good about his goals. He is increasing strength and stamina and feels he's able to do more with his grandson now. He will exercise on his own by walking and doing hand weights at home 1-2 days a week for 30 mins per session Reviewed MET's and goals. Pt tolerated exercise well with an average MET level of 2.74. Pt is feeling good about his goals and is increasing leg strength and stamina. He is continuing to have INC SOB, but SaO2 ok at 96%      Expected Outcomes Will continue to monitor pt and progress workloads as tolerated without sign or symptom Will continue to monitor pt and progress workloads as  tolerated without sign or symptom               Discharge Exercise Prescription (Final Exercise Prescription Changes):  Exercise Prescription Changes - 10/16/23 1617       Response to Exercise   Blood Pressure (Admit) 136/74    Blood Pressure (Exit) 114/58    Heart Rate (Admit) 71 bpm    Heart Rate (Exercise) 97 bpm    Heart Rate (Exit) 63 bpm    Rating of Perceived Exertion (Exercise) 13    Perceived Dyspnea (Exercise) 0    Symptoms Some fatigue pain in arms and legs, patient was pushing too hard, talked about moderation in exercise intensity    Comments REVD MET's    Duration Progress to 30 minutes of  aerobic without signs/symptoms of physical distress    Intensity THRR unchanged      Progression   Progression Continue to progress workloads  to maintain intensity without signs/symptoms of physical distress.    Average METs 4      Resistance Training   Training Prescription Yes    Weight 3 lbs wts    Reps 10-15    Time 10 Minutes      T5 Nustep   Level 4    SPM 101    Minutes 23    METs 4      Home Exercise Plan   Plans to continue exercise at Home (comment)    Frequency Add 2 additional days to program exercise sessions.    Initial Home Exercises Provided 09/04/23             Nutrition:  Target Goals: Understanding of nutrition guidelines, daily intake of sodium 1500mg , cholesterol 200mg , calories 30% from fat and 7% or less from saturated fats, daily to have 5 or more servings of fruits and vegetables.  Biometrics:    Nutrition Therapy Plan and Nutrition Goals:  Nutrition Therapy & Goals - 10/20/23 1446       Nutrition Therapy   Diet Heart healthy/Carbohydrate Consistent Diet    Drug/Food Interactions Statins/Certain Fruits      Personal Nutrition Goals   Nutrition Goal Patient to identify strategies for reducing cardiovascular risk by attending the [facility] education and nutrition series weekly.   goal in action.   Personal Goal #2 Patient to  improve diet quality by using the plate method as a guide for meal planning to include lean protein/plant protein, fruits, vegetables, whole grains, nonfat dairy as part of a well-balanced diet.   goal in progress.   Personal Goal #3 Patient to reduce sodium intake to 1500mg  per day   goal in progress.   Personal Goal #4 Patient to identify strategies for improving blood sugar control with goal A1c <7%   goal in progress.   Comments Goals in progress. [name] has medical history CKD3, pancreatic cancer, HTN, Afib, CAD, PAD, heart failure. He continues to attend the [facility] education and nutrition series regularly. Per last cardiology follow-up on 10/1, added hydralazine to aid with variable blood pressure readings and discussed the addition of GLP-1 medication. LDL remains well controlled <55. He is up 2.6# since starting with our program. His wife is a good support. Patient will benefit from participation in intensive cardiac rehab for nutrition, exercise, and lifestyle modification.      Intervention Plan   Intervention Prescribe, educate and counsel regarding individualized specific dietary modifications aiming towards targeted core components such as weight, hypertension, lipid management, diabetes, heart failure and other comorbidities.;Nutrition handout(s) given to patient.    Expected Outcomes Short Term Goal: Understand basic principles of dietary content, such as calories, fat, sodium, cholesterol and nutrients.;Long Term Goal: Adherence to prescribed nutrition plan.             Nutrition Assessments:  MEDIFICTS Score Key: >=70 Need to make dietary changes  40-70 Heart Healthy Diet <= 40 Therapeutic Level Cholesterol Diet   Flowsheet Row INTENSIVE CARDIAC REHAB from 08/19/2023 in [facility] for Heart, Vascular, & Lung Health  Picture Your Plate Total Score on Admission 39      Picture Your Plate Scores: <56 Unhealthy dietary pattern with much room for  improvement. 41-50 Dietary pattern unlikely to meet recommendations for good health and room for improvement. 51-60 More healthful dietary pattern, with some room for improvement.  >60 Healthy dietary pattern, although there may be some specific behaviors that could be improved.  Nutrition Goals Re-Evaluation:  Nutrition Goals Re-Evaluation     Row Name 09/18/23 1501 10/20/23 1446           Goals   Current Weight 256 lb 6.3 oz (116.3 kg) 257 lb 15 oz (117 kg)      Comment Cr 1.54, GFR 46; other most recent labs  A1c 7.5,  HDL 40, LipoproteinA WNL no new labs; most recent labs Cr 1.54, GFR 46, A1c 7.5, HDL 40, LipoproteinA WNL      Expected Outcome Goals in progress. [name] has medical history CKD3, pancreatic cancer, HTN, Afib, CAD, PAD, heart failure. He continues to attend the [facility] education and nutrition series regularly. Per last cardiology follow-up on 10/1, added hydralazine to aid with variable blood pressure readings and discussed the addition of GLP-1 medication. LDL remains well controlled <55. He is down 2.4# since starting with our program. His wife is a good support. Patient will benefit from participation in intensive cardiac rehab for nutrition, exercise, and lifestyle modification. Goals in progress. [name] has medical history CKD3, pancreatic cancer, HTN, Afib, CAD, PAD, heart failure. He continues to attend the [facility] education and nutrition series regularly. Per last cardiology follow-up on 10/1, added hydralazine to aid with variable blood pressure readings and discussed the addition of GLP-1 medication. LDL remains well controlled <55. He is up 2.6# since starting with our program. His wife is a good support. Patient will benefit from participation in intensive cardiac rehab for nutrition, exercise, and lifestyle modification.               Nutrition Goals Re-Evaluation:  Nutrition Goals Re-Evaluation     Row Name 09/18/23 1501 10/20/23 1446           Goals    Current Weight 256 lb 6.3 oz (116.3 kg) 257 lb 15 oz (117 kg)      Comment Cr 1.54, GFR 46; other most recent labs  A1c 7.5,  HDL 40, LipoproteinA WNL no new labs; most recent labs Cr 1.54, GFR 46, A1c 7.5, HDL 40, LipoproteinA WNL      Expected Outcome Goals in progress. [name] has medical history CKD3, pancreatic cancer, HTN, Afib, CAD, PAD, heart failure. He continues to attend the [facility] education and nutrition series regularly. Per last cardiology follow-up on 10/1, added hydralazine to aid with variable blood pressure readings and discussed the addition of GLP-1 medication. LDL remains well controlled <55. He is down 2.4# since starting with our program. His wife is a good support. Patient will benefit from participation in intensive cardiac rehab for nutrition, exercise, and lifestyle modification. Goals in progress. [name] has medical history CKD3, pancreatic cancer, HTN, Afib, CAD, PAD, heart failure. He continues to attend the [facility] education and nutrition series regularly. Per last cardiology follow-up on 10/1, added hydralazine to aid with variable blood pressure readings and discussed the addition of GLP-1 medication. LDL remains well controlled <55. He is up 2.6# since starting with our program. His wife is a good support. Patient will benefit from participation in intensive cardiac rehab for nutrition, exercise, and lifestyle modification.               Nutrition Goals Discharge (Final Nutrition Goals Re-Evaluation):  Nutrition Goals Re-Evaluation - 10/20/23 1446       Goals   Current Weight 257 lb 15 oz (117 kg)    Comment no new labs; most recent labs Cr 1.54, GFR 46, A1c 7.5, HDL 40, LipoproteinA WNL    Expected Outcome Goals  in progress. [name] has medical history CKD3, pancreatic cancer, HTN, Afib, CAD, PAD, heart failure. He continues to attend the [facility] education and nutrition series regularly. Per last cardiology follow-up on 10/1, added hydralazine to aid with variable blood  pressure readings and discussed the addition of GLP-1 medication. LDL remains well controlled <55. He is up 2.6# since starting with our program. His wife is a good support. Patient will benefit from participation in intensive cardiac rehab for nutrition, exercise, and lifestyle modification.             Psychosocial: Target Goals: Acknowledge presence or absence of significant depression and/or stress, maximize coping skills, provide positive support system. Participant is able to verbalize types and ability to use techniques and skills needed for reducing stress and depression.  Initial Review & Psychosocial Screening:   Quality of Life Scores:  Scores of 19 and below usually indicate a poorer quality of life in these areas.  A difference of  2-3 points is a clinically meaningful difference.  A difference of 2-3 points in the total score of the Quality of Life Index has been associated with significant improvement in overall quality of life, self-image, physical symptoms, and general health in studies assessing change in quality of life.  PHQ-9: Review Flowsheet  More data exists      08/11/2023 06/12/2023 12/16/2022 12/10/2022 07/07/2022  Depression screen PHQ 2/9  Decreased Interest 2 3 0 0 0  Down, Depressed, Hopeless 0 2 0 0 0  PHQ - 2 Score 2 5 0 0 0  Altered sleeping 0 0 0 0 0  Tired, decreased energy 2 3 0 2 0  Change in appetite 1 3 0 3 0  Feeling bad or failure about yourself  0 3 0 0 0  Trouble concentrating 1 3 0 0 0  Moving slowly or fidgety/restless 0 0 0 0 0  Suicidal thoughts 0 0 0 0 0  PHQ-9 Score 6 17 0 5 0  Difficult doing work/chores Somewhat difficult Very difficult Not difficult at all Not difficult at all -    Details           Interpretation of Total Score  Total Score Depression Severity:  1-4 = Minimal depression, 5-9 = Mild depression, 10-14 = Moderate depression, 15-19 = Moderately severe depression, 20-27 = Severe depression   Psychosocial  Evaluation and Intervention:   Psychosocial Re-Evaluation:  Psychosocial Re-Evaluation     Row Name 09/09/23 1610 10/06/23 1542 11/03/23 1611         Psychosocial Re-Evaluation   Current issues with Current Stress Concerns;History of Depression Current Stress Concerns;History of Depression Current Stress Concerns;History of Depression     Comments [name] has not voiced any increased concerns or stresors during exercise at cardiac rehab. Exercise is currently on hold. [name] has not voiced any increased concerns or stresors during exercise at cardiac rehab. [name] has returned to exercise. [name] has not exercised since 10/14/23. Unable to reassess     Expected Outcomes [name] will have controlled or decreased depression/ stressors upon completion of cardiac rehab. [name] will have controlled or decreased depression/ stressors upon completion of cardiac rehab. [name] will have controlled or decreased depression/ stressors upon completion of cardiac rehab.     Interventions Encouraged to attend Cardiac Rehabilitation for the exercise;Stress management education;Relaxation education Encouraged to attend Cardiac Rehabilitation for the exercise;Stress management education;Relaxation education Encouraged to attend Cardiac Rehabilitation for the exercise;Stress management education;Relaxation education     Continue Psychosocial Services  Follow  up required by staff Follow up required by staff Follow up required by staff       Initial Review   Source of Stress Concerns -- Chronic Illness Chronic Illness     Comments [name] will continue to participate in cardiac rehab for exercise, nutrition and lifestyle modifications [name] will continue to participate in cardiac rehab for exercise, nutrition and lifestyle modifications [name] will continue to participate in cardiac rehab for exercise, nutrition and lifestyle modifications              Psychosocial Discharge (Final Psychosocial Re-Evaluation):  Psychosocial Re-Evaluation - 11/03/23  1611       Psychosocial Re-Evaluation   Current issues with Current Stress Concerns;History of Depression    Comments [name] has not exercised since 10/14/23. Unable to reassess    Expected Outcomes [name] will have controlled or decreased depression/ stressors upon completion of cardiac rehab.    Interventions Encouraged to attend Cardiac Rehabilitation for the exercise;Stress management education;Relaxation education    Continue Psychosocial Services  Follow up required by staff      Initial Review   Source of Stress Concerns Chronic Illness    Comments [name] will continue to participate in cardiac rehab for exercise, nutrition and lifestyle modifications             Vocational Rehabilitation: Provide vocational rehab assistance to qualifying candidates.   Vocational Rehab Evaluation & Intervention:   Education: Education Goals: Education classes will be provided on a weekly basis, covering required topics. Participant will state understanding/return demonstration of topics presented.    Education     Row Name 09/04/23 1400     Education   Cardiac Education Topics Pritikin   Nurse, children's Exercise Physiologist   Select Nutrition   Nutrition Calorie Density   Instruction Review Code 1- Verbalizes Understanding   Class Start Time 1358   Class Stop Time 1432   Class Time Calculation (min) 34 min    Row Name 09/07/23 1500     Education   Cardiac Education Topics Pritikin   Select Workshops     Workshops   Educator Exercise Physiologist   Select Exercise   Exercise Workshop Exercise Basics: Building Your Action Plan   Instruction Review Code 1- Verbalizes Understanding   Class Start Time 1358   Class Stop Time 1449   Class Time Calculation (min) 51 min    Row Name 09/18/23 1500     Education   Cardiac Education Topics Pritikin   Engineer, mining Education   General Education  Hypertension and Heart Disease   Instruction Review Code 1- Verbalizes Understanding   Class Start Time 1400   Class Stop Time 1440   Class Time Calculation (min) 40 min    Row Name 09/21/23 1500     Education   Cardiac Education Topics Pritikin   Licensed conveyancer Nutrition   Nutrition Dining Out - Part 1   Instruction Review Code 1- Verbalizes Understanding   Class Start Time 1400   Class Stop Time 1445   Class Time Calculation (min) 45 min    Row Name 09/23/23 1500     Education   Cardiac Education Topics Pritikin   Customer service manager  Weekly Topic Comforting Weekend Breakfasts   Instruction Review Code 1- Verbalizes Understanding   Class Start Time 1400   Class Stop Time 1445   Class Time Calculation (min) 45 min    Row Name 09/25/23 1500     Education   Cardiac Education Topics Pritikin   Select Workshops     Workshops   Educator Exercise Physiologist   Select Psychosocial   Psychosocial Workshop Focused Goals, Sustainable Changes   Instruction Review Code 1- Verbalizes Understanding   Class Start Time 1402   Class Stop Time 1445   Class Time Calculation (min) 43 min    Row Name 09/28/23 1500     Education   Cardiac Education Topics Pritikin   Psychologist, forensic Exercise Education   Exercise Education Biomechanial Limitations   Instruction Review Code 1- Verbalizes Understanding   Class Start Time 1400   Class Stop Time 1449   Class Time Calculation (min) 49 min    Row Name 10/05/23 1500     Education   Cardiac Education Topics Pritikin   Glass blower/designer Nutrition   Nutrition Workshop Fueling a Forensic psychologist   Instruction Review Code 1- Tax inspector   Class Start Time 1400   Class Stop Time 1445   Class Time Calculation (min) 45 min     Row Name 10/09/23 1500     Education   Cardiac Education Topics Pritikin   Psychologist, forensic Exercise Education   Exercise Education Improving Performance   Instruction Review Code 1- Verbalizes Understanding   Class Start Time 1355   Class Stop Time 1433   Class Time Calculation (min) 38 min    Row Name 10/12/23 1500     Education   Cardiac Education Topics Pritikin   Geographical information systems officer Psychosocial   Psychosocial Workshop Healthy Sleep for a Healthy Heart   Instruction Review Code 1- Verbalizes Understanding   Class Start Time 1400   Class Stop Time 1450   Class Time Calculation (min) 50 min    Row Name 10/14/23 1600     Education   Cardiac Education Topics Pritikin   Customer service manager   Weekly Topic Simple Sides and Sauces   Instruction Review Code 1- Verbalizes Understanding   Class Start Time 1400   Class Stop Time 1440   Class Time Calculation (min) 40 min            Core Videos: Exercise    Move It!  Clinical staff conducted group or individual video education with verbal and written material and guidebook.  Patient learns the recommended Pritikin exercise program. Exercise with the goal of living a long, healthy life. Some of the health benefits of exercise include controlled diabetes, healthier blood pressure levels, improved cholesterol levels, improved heart and lung capacity, improved sleep, and better body composition. Everyone should speak with their doctor before starting or changing an exercise routine.  Biomechanical Limitations Clinical staff conducted group or individual video education with verbal and written material and guidebook.  Patient learns how biomechanical limitations can impact exercise and how we can mitigate and possibly overcome limitations to have an impactful  and  balanced exercise routine.  Body Composition Clinical staff conducted group or individual video education with verbal and written material and guidebook.  Patient learns that body composition (ratio of muscle mass to fat mass) is a key component to assessing overall fitness, rather than body weight alone. Increased fat mass, especially visceral belly fat, can put [place] at increased risk for metabolic syndrome, type 2 diabetes, heart disease, and even death. It is recommended to combine diet and exercise (cardiovascular and resistance training) to improve your body composition. Seek guidance from your physician and exercise physiologist before implementing an exercise routine.  Exercise Action Plan Clinical staff conducted group or individual video education with verbal and written material and guidebook.  Patient learns the recommended strategies to achieve and enjoy long-term exercise adherence, including variety, self-motivation, self-efficacy, and positive decision making. Benefits of exercise include fitness, good health, weight management, more energy, better sleep, less stress, and overall well-being.  Medical   Heart Disease Risk Reduction Clinical staff conducted group or individual video education with verbal and written material and guidebook.  Patient learns our heart is our most vital organ as it circulates oxygen, nutrients, white blood cells, and hormones throughout the entire body, and carries waste away. Data supports a plant-based eating plan like the Pritikin Program for its effectiveness in slowing progression of and reversing heart disease. The video provides a number of recommendations to address heart disease.   Metabolic Syndrome and Belly Fat  Clinical staff conducted group or individual video education with verbal and written material and guidebook.  Patient learns what metabolic syndrome is, how it leads to heart disease, and how one can reverse it and keep it from coming  back. You have metabolic syndrome if you have 3 of the following 5 criteria: abdominal obesity, high blood pressure, high triglycerides, low HDL cholesterol, and high blood sugar.  Hypertension and Heart Disease Clinical staff conducted group or individual video education with verbal and written material and guidebook.  Patient learns that high blood pressure, or hypertension, is very common in the [place]. Hypertension is largely due to excessive salt intake, but other important risk factors include being overweight, physical inactivity, drinking too much alcohol, smoking, and not eating enough potassium from fruits and vegetables. High blood pressure is a leading risk factor for heart attack, stroke, congestive heart failure, dementia, kidney failure, and premature death. Long-term effects of excessive salt intake include stiffening of the arteries and thickening of heart muscle and organ damage. Recommendations include ways to reduce hypertension and the risk of heart disease.  Diseases of Our Time - Focusing on Diabetes Clinical staff conducted group or individual video education with verbal and written material and guidebook.  Patient learns why the best way to stop diseases of our time is prevention, through food and other lifestyle changes. Medicine (such as prescription pills and surgeries) is often only a Band-Aid on the problem, not a long-term solution. Most common diseases of our time include obesity, type 2 diabetes, hypertension, heart disease, and cancer. The Pritikin Program is recommended and has been proven to help reduce, reverse, and/or prevent the damaging effects of metabolic syndrome.  Nutrition   Overview of the Pritikin Eating Plan  Clinical staff conducted group or individual video education with verbal and written material and guidebook.  Patient learns about the Pritikin Eating Plan for disease risk reduction. The Pritikin Eating Plan emphasizes a wide variety of  unrefined, minimally-processed carbohydrates, like fruits, vegetables, whole grains, and legumes. Go,  Caution, and Stop food choices are explained. Plant-based and lean animal proteins are emphasized. Rationale provided for low sodium intake for blood pressure control, low added sugars for blood sugar stabilization, and low added fats and oils for coronary artery disease risk reduction and weight management.  Calorie Density  Clinical staff conducted group or individual video education with verbal and written material and guidebook.  Patient learns about calorie density and how it impacts the Pritikin Eating Plan. Knowing the characteristics of the food you choose will help you decide whether those foods will lead to weight gain or weight loss, and whether you want to consume more or less of them. Weight loss is usually a side effect of the Pritikin Eating Plan because of its focus on low calorie-dense foods.  Label Reading  Clinical staff conducted group or individual video education with verbal and written material and guidebook.  Patient learns about the Pritikin recommended label reading guidelines and corresponding recommendations regarding calorie density, added sugars, sodium content, and whole grains.  Dining Out - Part 1  Clinical staff conducted group or individual video education with verbal and written material and guidebook.  Patient learns that restaurant meals can be sabotaging because they can be so high in calories, fat, sodium, and/or sugar. Patient learns recommended strategies on how to positively address this and avoid unhealthy pitfalls.  Facts on Fats  Clinical staff conducted group or individual video education with verbal and written material and guidebook.  Patient learns that lifestyle modifications can be just as effective, if not more so, as many medications for lowering your risk of heart disease. A Pritikin lifestyle can help to reduce your risk of inflammation and  atherosclerosis (cholesterol build-up, or plaque, in the artery walls). Lifestyle interventions such as dietary choices and physical activity address the cause of atherosclerosis. A review of the types of fats and their impact on blood cholesterol levels, along with dietary recommendations to reduce fat intake is also included.  Nutrition Action Plan  Clinical staff conducted group or individual video education with verbal and written material and guidebook.  Patient learns how to incorporate Pritikin recommendations into their lifestyle. Recommendations include planning and keeping personal health goals in mind as an important part of their success.  Healthy Mind-Set    Healthy Minds, Bodies, Hearts  Clinical staff conducted group or individual video education with verbal and written material and guidebook.  Patient learns how to identify when they are stressed. Video will discuss the impact of that stress, as well as the many benefits of stress management. Patient will also be introduced to stress management techniques. The way we think, act, and feel has an impact on our hearts.  How Our Thoughts Can Heal Our Hearts  Clinical staff conducted group or individual video education with verbal and written material and guidebook.  Patient learns that negative thoughts can cause depression and anxiety. This can result in negative lifestyle behavior and serious health problems. Cognitive behavioral therapy is an effective method to help control our thoughts in order to change and improve our emotional outlook.  Additional Videos:  Exercise    Improving Performance  Clinical staff conducted group or individual video education with verbal and written material and guidebook.  Patient learns to use a non-linear approach by alternating intensity levels and lengths of time spent exercising to help burn more calories and lose more body fat. Cardiovascular exercise helps improve heart health, metabolism,  hormonal balance, blood sugar control, and recovery from fatigue. Resistance  training improves strength, endurance, balance, coordination, reaction time, metabolism, and muscle mass. Flexibility exercise improves circulation, posture, and balance. Seek guidance from your physician and exercise physiologist before implementing an exercise routine and learn your capabilities and proper form for all exercise.  Introduction to Yoga  Clinical staff conducted group or individual video education with verbal and written material and guidebook.  Patient learns about yoga, a discipline of the coming together of mind, breath, and body. The benefits of yoga include improved flexibility, improved range of motion, better posture and core strength, increased lung function, weight loss, and positive self-image. Yoga's heart health benefits include lowered blood pressure, healthier heart rate, decreased cholesterol and triglyceride levels, improved immune function, and reduced stress. Seek guidance from your physician and exercise physiologist before implementing an exercise routine and learn your capabilities and proper form for all exercise.  Medical   Aging: Enhancing Your Quality of Life  Clinical staff conducted group or individual video education with verbal and written material and guidebook.  Patient learns key strategies and recommendations to stay in good physical health and enhance quality of life, such as prevention strategies, having an advocate, securing a Health Care Proxy and Power of Attorney, and keeping a list of medications and system for tracking them. It also discusses how to avoid risk for bone loss.  Biology of Weight Control  Clinical staff conducted group or individual video education with verbal and written material and guidebook.  Patient learns that weight gain occurs because we consume more calories than we burn (eating more, moving less). Even if your body weight is normal, you may have  higher ratios of fat compared to muscle mass. Too much body fat puts you at increased risk for cardiovascular disease, heart attack, stroke, type 2 diabetes, and obesity-related cancers. In addition to exercise, following the Pritikin Eating Plan can help reduce your risk.  Decoding Lab Results  Clinical staff conducted group or individual video education with verbal and written material and guidebook.  Patient learns that lab test reflects one measurement whose values change over time and are influenced by many factors, including medication, stress, sleep, exercise, food, hydration, pre-existing medical conditions, and more. It is recommended to use the knowledge from this video to become more involved with your lab results and evaluate your numbers to speak with your doctor.   Diseases of Our Time - Overview  Clinical staff conducted group or individual video education with verbal and written material and guidebook.  Patient learns that according to the CDC, 50% to 70% of chronic diseases (such as obesity, type 2 diabetes, elevated lipids, hypertension, and heart disease) are avoidable through lifestyle improvements including healthier food choices, listening to satiety cues, and increased physical activity.  Sleep Disorders Clinical staff conducted group or individual video education with verbal and written material and guidebook.  Patient learns how good quality and duration of sleep are important to overall health and well-being. Patient also learns about sleep disorders and how they impact health along with recommendations to address them, including discussing with a physician.  Nutrition  Dining Out - Part 2 Clinical staff conducted group or individual video education with verbal and written material and guidebook.  Patient learns how to plan ahead and communicate in order to maximize their dining experience in a healthy and nutritious manner. Included are recommended food choices based on  the type of restaurant the patient is visiting.   Fueling a Banker conducted group or individual video  education with verbal and written material and guidebook.  There is a strong connection between our food choices and our health. Diseases like obesity and type 2 diabetes are very prevalent and are in large-part due to lifestyle choices. The Pritikin Eating Plan provides plenty of food and hunger-curbing satisfaction. It is easy to follow, affordable, and helps reduce health risks.  Menu Workshop  Clinical staff conducted group or individual video education with verbal and written material and guidebook.  Patient learns that restaurant meals can sabotage health goals because they are often packed with calories, fat, sodium, and sugar. Recommendations include strategies to plan ahead and to communicate with the manager, chef, or server to help order a healthier meal.  Planning Your Eating Strategy  Clinical staff conducted group or individual video education with verbal and written material and guidebook.  Patient learns about the Pritikin Eating Plan and its benefit of reducing the risk of disease. The Pritikin Eating Plan does not focus on calories. Instead, it emphasizes high-quality, nutrient-rich foods. By knowing the characteristics of the foods, we choose, we can determine their calorie density and make informed decisions.  Targeting Your Nutrition Priorities  Clinical staff conducted group or individual video education with verbal and written material and guidebook.  Patient learns that lifestyle habits have a tremendous impact on disease risk and progression. This video provides eating and physical activity recommendations based on your personal health goals, such as reducing LDL cholesterol, losing weight, preventing or controlling type 2 diabetes, and reducing high blood pressure.  Vitamins and Minerals  Clinical staff conducted group or individual video education  with verbal and written material and guidebook.  Patient learns different ways to obtain key vitamins and minerals, including through a recommended healthy diet. It is important to discuss all supplements you take with your doctor.   Healthy Mind-Set    Smoking Cessation  Clinical staff conducted group or individual video education with verbal and written material and guidebook.  Patient learns that cigarette smoking and tobacco addiction pose a serious health risk which affects millions of people. Stopping smoking will significantly reduce the risk of heart disease, lung disease, and many forms of cancer. Recommended strategies for quitting are covered, including working with your doctor to develop a successful plan.  Culinary   Becoming a Set designer conducted group or individual video education with verbal and written material and guidebook.  Patient learns that cooking at home can be healthy, cost-effective, quick, and puts them in control. Keys to cooking healthy recipes will include looking at your recipe, assessing your equipment needs, planning ahead, making it simple, choosing cost-effective seasonal ingredients, and limiting the use of added fats, salts, and sugars.  Cooking - Breakfast and Snacks  Clinical staff conducted group or individual video education with verbal and written material and guidebook.  Patient learns how important breakfast is to satiety and nutrition through the entire day. Recommendations include key foods to eat during breakfast to help stabilize blood sugar levels and to prevent overeating at meals later in the day. Planning ahead is also a key component.  Cooking - Educational psychologist conducted group or individual video education with verbal and written material and guidebook.  Patient learns eating strategies to improve overall health, including an approach to cook more at home. Recommendations include thinking of animal protein  as a side on your plate rather than center stage and focusing instead on lower calorie dense options like vegetables, fruits, whole  grains, and plant-based proteins, such as beans. Making sauces in large quantities to freeze for later and leaving the skin on your vegetables are also recommended to maximize your experience.  Cooking - Healthy Salads and Dressing Clinical staff conducted group or individual video education with verbal and written material and guidebook.  Patient learns that vegetables, fruits, whole grains, and legumes are the foundations of the Pritikin Eating Plan. Recommendations include how to incorporate each of these in flavorful and healthy salads, and how to create homemade salad dressings. Proper handling of ingredients is also covered. Cooking - Soups and [employer] - Soups and Desserts Clinical staff conducted group or individual video education with verbal and written material and guidebook.  Patient learns that Pritikin soups and desserts make for easy, nutritious, and delicious snacks and meal components that are low in sodium, fat, sugar, and calorie density, while high in vitamins, minerals, and filling fiber. Recommendations include simple and healthy ideas for soups and desserts.   Overview     The Pritikin Solution Program Overview Clinical staff conducted group or individual video education with verbal and written material and guidebook.  Patient learns that the results of the Pritikin Program have been documented in more than 100 articles published in peer-reviewed journals, and the benefits include reducing risk factors for (and, in some cases, even reversing) high cholesterol, high blood pressure, type 2 diabetes, obesity, and more! An overview of the three key pillars of the Pritikin Program will be covered: eating well, doing regular exercise, and having a healthy mind-set.  WORKSHOPS  Exercise: Exercise Basics: Building Your Action Plan Clinical staff  led group instruction and group discussion with PowerPoint presentation and patient guidebook. To enhance the learning environment the use of posters, models and videos may be added. At the conclusion of this workshop, patients will comprehend the difference between physical activity and exercise, as well as the benefits of incorporating both, into their routine. Patients will understand the FITT (Frequency, Intensity, Time, and Type) principle and how to use it to build an exercise action plan. In addition, safety concerns and other considerations for exercise and cardiac rehab will be addressed by the presenter. The purpose of this lesson is to promote a comprehensive and effective weekly exercise routine in order to improve patients' overall level of fitness.   Managing Heart Disease: Your Path to a Healthier Heart Clinical staff led group instruction and group discussion with PowerPoint presentation and patient guidebook. To enhance the learning environment the use of posters, models and videos may be added.At the conclusion of this workshop, patients will understand the anatomy and physiology of the heart. Additionally, they will understand how Pritikin's three pillars impact the risk factors, the progression, and the management of heart disease.  The purpose of this lesson is to provide a high-level overview of the heart, heart disease, and how the Pritikin lifestyle positively impacts risk factors.  Exercise Biomechanics Clinical staff led group instruction and group discussion with PowerPoint presentation and patient guidebook. To enhance the learning environment the use of posters, models and videos may be added. Patients will learn how the structural parts of their bodies function and how these functions impact their daily activities, movement, and exercise. Patients will learn how to promote a neutral spine, learn how to manage pain, and identify ways to improve their physical movement  in order to promote healthy living. The purpose of this lesson is to expose patients to common physical limitations that impact physical activity. Participants  will learn practical ways to adapt and manage aches and pains, and to minimize their effect on regular exercise. Patients will learn how to maintain good posture while sitting, walking, and lifting.  Balance Training and Fall Prevention  Clinical staff led group instruction and group discussion with PowerPoint presentation and patient guidebook. To enhance the learning environment the use of posters, models and videos may be added. At the conclusion of this workshop, patients will understand the importance of their sensorimotor skills (vision, proprioception, and the vestibular system) in maintaining their ability to balance as they age. Patients will apply a variety of balancing exercises that are appropriate for their current level of function. Patients will understand the common causes for poor balance, possible solutions to these problems, and ways to modify their physical environment in order to minimize their fall risk. The purpose of this lesson is to teach patients about the importance of maintaining balance as they age and ways to minimize their risk of falling.  WORKSHOPS   Nutrition:  Fueling a Ship broker led group instruction and group discussion with PowerPoint presentation and patient guidebook. To enhance the learning environment the use of posters, models and videos may be added. Patients will review the foundational principles of the Pritikin Eating Plan and understand what constitutes a serving size in each of the food groups. Patients will also learn Pritikin-friendly foods that are better choices when away from home and review make-ahead meal and snack options. Calorie density will be reviewed and applied to three nutrition priorities: weight maintenance, weight loss, and weight gain. The purpose of this  lesson is to reinforce (in a group setting) the key concepts around what patients are recommended to eat and how to apply these guidelines when away from home by planning and selecting Pritikin-friendly options. Patients will understand how calorie density may be adjusted for different weight management goals.  Mindful Eating  Clinical staff led group instruction and group discussion with PowerPoint presentation and patient guidebook. To enhance the learning environment the use of posters, models and videos may be added. Patients will briefly review the concepts of the Pritikin Eating Plan and the importance of low-calorie dense foods. The concept of mindful eating will be introduced as well as the importance of paying attention to internal hunger signals. Triggers for non-hunger eating and techniques for dealing with triggers will be explored. The purpose of this lesson is to provide patients with the opportunity to review the basic principles of the Pritikin Eating Plan, discuss the value of eating mindfully and how to measure internal cues of hunger and fullness using the Hunger Scale. Patients will also discuss reasons for non-hunger eating and learn strategies to use for controlling emotional eating.  Targeting Your Nutrition Priorities Clinical staff led group instruction and group discussion with PowerPoint presentation and patient guidebook. To enhance the learning environment the use of posters, models and videos may be added. Patients will learn how to determine their genetic susceptibility to disease by reviewing their family history. Patients will gain insight into the importance of diet as part of an overall healthy lifestyle in mitigating the impact of genetics and other environmental insults. The purpose of this lesson is to provide patients with the opportunity to assess their personal nutrition priorities by looking at their family history, their own health history and current risk factors.  Patients will also be able to discuss ways of prioritizing and modifying the Pritikin Eating Plan for their highest risk areas  Menu  Clinical staff led group instruction and group discussion with PowerPoint presentation and patient guidebook. To enhance the learning environment the use of posters, models and videos may be added. Using menus brought in from [employer], or printed from [employer], patients will apply the Pritikin dining out guidelines that were presented in the [employer] video. Patients will also be able to practice these guidelines in a variety of provided scenarios. The purpose of this lesson is to provide patients with the opportunity to practice hands-on learning of the Pritikin Dining Out guidelines with actual menus and practice scenarios.  Label Reading Clinical staff led group instruction and group discussion with PowerPoint presentation and patient guidebook. To enhance the learning environment the use of posters, models and videos may be added. Patients will review and discuss the Pritikin label reading guidelines presented in Pritikin's Label Reading Educational series video. Using fool labels brought in from local grocery stores and markets, patients will apply the label reading guidelines and determine if the packaged food meet the Pritikin guidelines. The purpose of this lesson is to provide patients with the opportunity to review, discuss, and practice hands-on learning of the Pritikin Label Reading guidelines with actual packaged food labels. Cooking School  Pritikin's [employer] are designed to teach patients ways to prepare quick, simple, and affordable recipes at home. The importance of nutrition's role in chronic disease risk reduction is reflected in its emphasis in the overall Pritikin program. By learning how to prepare essential core Pritikin Eating Plan recipes, patients will increase control over what they eat; be able to  customize the flavor of foods without the use of added salt, sugar, or fat; and improve the quality of the food they consume. By learning a set of core recipes which are easily assembled, quickly prepared, and affordable, patients are more likely to prepare more healthy foods at home. These workshops focus on convenient breakfasts, simple entres, side dishes, and desserts which can be prepared with minimal effort and are consistent with nutrition recommendations for cardiovascular risk reduction. Cooking [employer] are taught by a Armed forces logistics/support/administrative officer (RD) who has been trained by the [employer]. The chef or RD has a clear understanding of the importance of minimizing - if not completely eliminating - added fat, sugar, and sodium in recipes. Throughout the series of Cooking School Workshop sessions, patients will learn about healthy ingredients and efficient methods of cooking to build confidence in their capability to prepare    Cooking School weekly topics:  Adding Flavor- Sodium-Free  Fast and Healthy Breakfasts  Powerhouse Plant-Based Proteins  Satisfying Salads and Dressings  Simple Sides and Sauces  International Cuisine-Spotlight on the [employer] Zones  Delicious Desserts  Savory Soups  [employer] - Meals in a Astronomer Appetizers and Snacks  Comforting Weekend Breakfasts  One-Pot Wonders   Fast Evening Meals  Landscape architect Your Pritikin Plate  WORKSHOPS   Healthy Mindset (Psychosocial):  Focused Goals, Sustainable Changes Clinical staff led group instruction and group discussion with PowerPoint presentation and patient guidebook. To enhance the learning environment the use of posters, models and videos may be added. Patients will be able to apply effective goal setting strategies to establish at least one personal goal, and then take consistent, meaningful action toward that goal. They will learn to identify common barriers to  achieving personal goals and develop strategies to overcome them. Patients will also gain an understanding of how our mind-set can  impact our ability to achieve goals and the importance of cultivating a positive and growth-oriented mind-set. The purpose of this lesson is to provide patients with a deeper understanding of how to set and achieve personal goals, as well as the tools and strategies needed to overcome common obstacles which may arise along the way.  From Head to Heart: The Power of a Healthy Outlook  Clinical staff led group instruction and group discussion with PowerPoint presentation and patient guidebook. To enhance the learning environment the use of posters, models and videos may be added. Patients will be able to recognize and describe the impact of emotions and mood on physical health. They will discover the importance of self-care and explore self-care practices which may work for them. Patients will also learn how to utilize the 4 C's to cultivate a healthier outlook and better manage stress and challenges. The purpose of this lesson is to demonstrate to patients how a healthy outlook is an essential part of maintaining good health, especially as they continue their cardiac rehab journey.  Healthy Sleep for a Healthy Heart Clinical staff led group instruction and group discussion with PowerPoint presentation and patient guidebook. To enhance the learning environment the use of posters, models and videos may be added. At the conclusion of this workshop, patients will be able to demonstrate knowledge of the importance of sleep to overall health, well-being, and quality of life. They will understand the symptoms of, and treatments for, common sleep disorders. Patients will also be able to identify daytime and nighttime behaviors which impact sleep, and they will be able to apply these tools to help manage sleep-related challenges. The purpose of this lesson is to provide patients with a  general overview of sleep and outline the importance of quality sleep. Patients will learn about a few of the most common sleep disorders. Patients will also be introduced to the concept of "sleep hygiene," and discover ways to self-manage certain sleeping problems through simple daily behavior changes. Finally, the workshop will motivate patients by clarifying the links between quality sleep and their goals of heart-healthy living.   Recognizing and Reducing Stress Clinical staff led group instruction and group discussion with PowerPoint presentation and patient guidebook. To enhance the learning environment the use of posters, models and videos may be added. At the conclusion of this workshop, patients will be able to understand the types of stress reactions, differentiate between acute and chronic stress, and recognize the impact that chronic stress has on their health. They will also be able to apply different coping mechanisms, such as reframing negative self-talk. Patients will have the opportunity to practice a variety of stress management techniques, such as deep abdominal breathing, progressive muscle relaxation, and/or guided imagery.  The purpose of this lesson is to educate patients on the role of stress in their lives and to provide healthy techniques for coping with it.  Learning Barriers/Preferences:   Education Topics:  Knowledge Questionnaire Score:   Core Components/Risk Factors/Patient Goals at Admission:   Core Components/Risk Factors/Patient Goals Review:   Goals and Risk Factor Review     Row Name 09/09/23 0846 10/06/23 1543 11/03/23 1612         Core Components/Risk Factors/Patient Goals Review   Personal Goals Review Weight Management/Obesity;Diabetes;Improve shortness of breath with ADL's;Hypertension;Lipids;Stress Weight Management/Obesity;Diabetes;Improve shortness of breath with ADL's;Hypertension;Lipids;Stress Weight Management/Obesity;Diabetes;Improve shortness  of breath with ADL's;Hypertension;Lipids;Stress     Review [name] has done well with exercise at cardiac rehab.  Vital signs have been stable.  [name] reported having indigestion 09/07/23. 12 lead ordered and obtained. Sent to Ed per onsite provider. Exercise is currently on hold [name] has done well with exercise at cardiac rehab.  Vital signs and CBg's have been stable upon [name]'s return to exercise. Takes multiple rest breaks when walking the track has a high met level on the nustep. [name] has not exercised at cardiac rehab since 10/14/23. [name] haas been cleared to return to exercise per Dr Cristal Deer.     Expected Outcomes [name] will continue to participate in cardiac rehab for exercise, nutriiton and lifestyle modifications [name] will continue to participate in cardiac rehab for exercise, nutriiton and lifestyle modifications [name] will continue to participate in cardiac rehab for exercise, nutriiton and lifestyle modifications              Core Components/Risk Factors/Patient Goals at Discharge (Final Review):   Goals and Risk Factor Review - 11/03/23 1612       Core Components/Risk Factors/Patient Goals Review   Personal Goals Review Weight Management/Obesity;Diabetes;Improve shortness of breath with ADL's;Hypertension;Lipids;Stress    Review [name] has not exercised at cardiac rehab since 10/14/23. [name] haas been cleared to return to exercise per Dr Cristal Deer.    Expected Outcomes [name] will continue to participate in cardiac rehab for exercise, nutriiton and lifestyle modifications             ITP Comments:  ITP Comments     Row Name 09/09/23 0832 10/06/23 1541 11/03/23 1610       ITP Comments 30 Day ITP Review. Exercise is currently on hold due to recent ED visit on 09/07/23 30 Day ITP Review. [name] has good participation in cardiac rehab when in attendance. 30 Day ITP Review. [name] has been cleared to return to exercise at cardiac rehab by Dr Cristal Deer. [name]'s last day of attendance was on 10/14/23.               Comments: See ITP comments.Thayer Headings RN BSN

## 2023-11-04 ENCOUNTER — Encounter (HOSPITAL_COMMUNITY): Payer: Medicare Other

## 2023-11-06 ENCOUNTER — Other Ambulatory Visit: Payer: Self-pay

## 2023-11-06 ENCOUNTER — Ambulatory Visit (INDEPENDENT_AMBULATORY_CARE_PROVIDER_SITE_OTHER): Payer: Medicare Other

## 2023-11-06 ENCOUNTER — Telehealth: Payer: Self-pay | Admitting: Family Medicine

## 2023-11-06 DIAGNOSIS — Z23 Encounter for immunization: Secondary | ICD-10-CM | POA: Diagnosis not present

## 2023-11-06 DIAGNOSIS — G629 Polyneuropathy, unspecified: Secondary | ICD-10-CM

## 2023-11-06 DIAGNOSIS — Z9081 Acquired absence of spleen: Secondary | ICD-10-CM

## 2023-11-06 MED ORDER — GABAPENTIN 300 MG PO CAPS: 300.0000 mg | ORAL_CAPSULE | Freq: Every day | ORAL | 5 refills | Status: DC

## 2023-11-06 NOTE — Telephone Encounter (Signed)
Aware. [name]

## 2023-11-06 NOTE — Telephone Encounter (Signed)
Pt wife is calling and pt is good on gabapentin (NEURONTIN) 300 MG capsule

## 2023-11-09 ENCOUNTER — Encounter (HOSPITAL_COMMUNITY)
Admission: RE | Admit: 2023-11-09 | Discharge: 2023-11-09 | Disposition: A | Discharge status: Home / Self Care | Payer: Medicare Other | Source: Ambulatory Visit | Attending: Cardiology | Admitting: Cardiology

## 2023-11-09 DIAGNOSIS — I214 Non-ST elevation (NSTEMI) myocardial infarction: Secondary | ICD-10-CM

## 2023-11-10 DIAGNOSIS — R310 Gross hematuria: Secondary | ICD-10-CM | POA: Diagnosis not present

## 2023-11-10 DIAGNOSIS — Z8546 Personal history of malignant neoplasm of prostate: Secondary | ICD-10-CM | POA: Diagnosis not present

## 2023-11-10 DIAGNOSIS — R3915 Urgency of urination: Secondary | ICD-10-CM | POA: Diagnosis not present

## 2023-11-11 ENCOUNTER — Ambulatory Visit (HOSPITAL_COMMUNITY): Payer: Medicare Other

## 2023-11-16 ENCOUNTER — Ambulatory Visit (HOSPITAL_COMMUNITY): Payer: Medicare Other

## 2023-11-18 ENCOUNTER — Ambulatory Visit (HOSPITAL_COMMUNITY): Payer: Medicare Other

## 2023-11-18 ENCOUNTER — Telehealth: Payer: Self-pay | Admitting: Cardiology

## 2023-11-18 DIAGNOSIS — N1831 Chronic kidney disease, stage 3a: Secondary | ICD-10-CM | POA: Diagnosis not present

## 2023-11-18 NOTE — Telephone Encounter (Signed)
   Pt c/o of Chest Pain: STAT if active CP, including tightness, pressure, jaw pain, radiating pain to shoulder/upper arm/back, CP unrelieved by Nitro. Symptoms reported of SOB, nausea, vomiting, sweating.  1. Are you having CP right now? yes    2. Are you experiencing any other symptoms (ex. SOB, nausea, vomiting, sweating)? Pain in both arms   3. Is your CP continuous or coming and going? continous   4. Have you taken Nitroglycerin? Yes has taken 2   5. How long have you been experiencing CP? Since Monday    6. If NO CP at time of call then end call with telling Pt to call back or call 911 if Chest pain returns prior to return call from triage team.

## 2023-11-18 NOTE — Telephone Encounter (Signed)
Spoke with patient regarding chest pain Patient has been having chest pressure across chest with numbness in arms  Took 2 NTG yesterday and within 30 minutes subsided  Returned today, relieved with NTG  Denies shortness of breath  Has increased Isosorbide to 30 mg 3 tablets daily  Scheduled appointment with Ronn Melena NP for tomorrow  Advised patient to go to ED if worsening symptoms or call 911 if has to use 3 rd NTG, verbalized understanding

## 2023-11-19 ENCOUNTER — Ambulatory Visit (HOSPITAL_BASED_OUTPATIENT_CLINIC_OR_DEPARTMENT_OTHER): Payer: Medicare Other | Admitting: Family

## 2023-11-19 ENCOUNTER — Encounter (HOSPITAL_BASED_OUTPATIENT_CLINIC_OR_DEPARTMENT_OTHER): Payer: Self-pay | Admitting: Family

## 2023-11-19 VITALS — BP 130/60 | Ht 71.0 in | Wt 261.0 lb

## 2023-11-19 DIAGNOSIS — R072 Precordial pain: Secondary | ICD-10-CM | POA: Diagnosis not present

## 2023-11-19 DIAGNOSIS — D6859 Other primary thrombophilia: Secondary | ICD-10-CM

## 2023-11-19 DIAGNOSIS — I25118 Atherosclerotic heart disease of native coronary artery with other forms of angina pectoris: Secondary | ICD-10-CM | POA: Diagnosis not present

## 2023-11-19 DIAGNOSIS — I480 Paroxysmal atrial fibrillation: Secondary | ICD-10-CM

## 2023-11-19 DIAGNOSIS — E785 Hyperlipidemia, unspecified: Secondary | ICD-10-CM | POA: Diagnosis not present

## 2023-11-19 MED ORDER — HYDRALAZINE HCL 25 MG PO TABS: 25.0000 mg | ORAL_TABLET | Freq: Three times a day (TID) | ORAL | 3 refills | Status: DC

## 2023-11-19 MED ORDER — ISOSORBIDE MONONITRATE ER 120 MG PO TB24: 120.0000 mg | ORAL_TABLET | Freq: Every day | ORAL | 3 refills | Status: DC

## 2023-11-19 MED ORDER — AMLODIPINE BESYLATE 5 MG PO TABS
5.0000 mg | ORAL_TABLET | Freq: Every day | ORAL | 3 refills | Status: AC
Start: 2023-11-19 — End: 2024-02-17

## 2023-11-19 NOTE — Progress Notes (Signed)
Cardiology Office Note:  .   Date:  11/25/2023  ID:  [name], DOB 1946/06/20, MRN [id number] PCP: [place], [name] G, MD  [facility] Providers Cardiologist:  Jodelle Red, MD    History of Present Illness: .   [name] is a 77 y.o. male with hx of atrial fibrillation, CAD s/p CABG, THN, HLD, dyspnea, OSA, CKD, DM2, peripheral neuropathy.  At visit 05/2023 noted chest pain and took 2 nitroglycerin in clinic. Subsequent ED visit with troponin peak 201. BNP 347. Echo LVEF 50-55%, global hypokinesis, vascular congestion. Diuresed and started on Jardiance. Valsartan, Hydralazine discontinued due to AKI, normotension. LHC 06/09/23 for NSTEMI with patent LIMA-LAD, SVG-PDA, SVG-ramus occluded. Severe disease of ostial LAD and occluded prox LCx with known occluded ostial right coronary artery. Plans for medical management.   Last seen 10/30/23 noting numbness to both arms, across chest, up back of neck associated with headache. Often prompted with exertion (such as nu-step at cardiac rehab) and resolved with nitroglycerin. At visit 10/30/23 he was started on Imdur with plans to up-titrate to 120mg  daily.   Presents today for follow up with his wife after contacting the office noting chest pain. Has taken 90mg  of Imdur starting earlier this week. Descirbes chest pain as a "pressure" comes up left arm and goes into left ear and head leading to headache. Overall unchanged from previous. He took nitroglycerin twice this week. Enjoyed spending thanksgiving with his 40 year old grandson.    ROS: Please see the history of present illness.    All other systems reviewed and are negative.   Studies Reviewed: Marland Kitchen   EKG Interpretation Date/Time:  Thursday November 19 2023 14:55:01 EST Ventricular Rate:  71 PR Interval:  182 QRS Duration:  84 QT Interval:  452 QTC Calculation: 491 R Axis:   -69  Text Interpretation: Normal sinus rhythm with sinus arrhythmia Left axis  deviation Anterior infarct (cited on or before 30-Oct-2023) No acute changes Confirmed by Gillian Shields ([id number]) on 11/25/2023 8:17:13 PM    Cardiac Studies & Procedures   CARDIAC CATHETERIZATION  CARDIAC CATHETERIZATION 06/09/2023  Narrative   Prox Graft to Mid Graft lesion is 40% stenosed.   Ost RCA to Prox RCA lesion is 100% stenosed.   Origin to Prox Graft lesion is 100% stenosed.   Ramus lesion is 80% stenosed.   Dist LM lesion is 80% stenosed.   Ost LAD lesion is 100% stenosed.   Prox Cx to Mid Cx lesion is 100% stenosed.  1.  Patent LIMA to LAD and vein graft to PDA; the vein graft to ramus is now occluded. 2.  Severe native vessel disease with occluded ostial LAD, occluded proximal left circumflex and known occluded ostial right coronary artery (not imaged on this study). 3.  Elevated LVEDP of 28 to 30 mmHg across the respiratory cycle.  Recommendation: Medical therapy.  Findings Coronary Findings Diagnostic  Dominance: Right  Left Main Dist LM lesion is 80% stenosed.  Left Anterior Descending Ost LAD lesion is 100% stenosed.  Ramus Intermedius Ramus lesion is 80% stenosed.  Left Circumflex Prox Cx to Mid Cx lesion is 100% stenosed.  Right Coronary Artery Ost RCA to Prox RCA lesion is 100% stenosed.  Graft To RPDA Prox Graft to Mid Graft lesion is 40% stenosed.  LIMA Graft To Mid LAD  Graft To Ramus Origin to Prox Graft lesion is 100% stenosed.  Intervention  No interventions have been documented.   STRESS TESTS  MYOCARDIAL PERFUSION IMAGING  11/20/2014   ECHOCARDIOGRAM  ECHOCARDIOGRAM COMPLETE 06/09/2023  Narrative ECHOCARDIOGRAM REPORT    Patient Name:   [name] [facility] Date of Exam: 06/09/2023 Medical Rec #:  [id number]             Height:       71.0 in Accession #:    [id number]            Weight:       263.9 lb Date of Birth:  1946-07-17            BSA:          2.372 m Patient Age:    47 years              BP:           111/70  mmHg Patient Gender: M                     HR:           56 bpm. Exam Location:  Inpatient  Procedure: 2D Echo, Cardiac Doppler and Color Doppler  Indications:    STEMI  History:        Patient has prior history of Echocardiogram examinations, most recent 03/04/2021. CAD and Angina, Prior CABG, PAD; Risk Factors:Hypertension, Diabetes and Dyslipidemia.  Sonographer:    Lucendia Herrlich Referring Phys: [id number] BELAL A SULEIMAN  IMPRESSIONS   1. Left ventricular ejection fraction, by estimation, is 50 to 55%. The left ventricle has low normal function. The left ventricle demonstrates global hypokinesis. There is mild concentric left ventricular hypertrophy of the inferior segment. Left ventricular diastolic function could not be evaluated. 2. Right ventricular systolic function was not well visualized. The right ventricular size is not well visualized. Tricuspid regurgitation signal is inadequate for assessing PA pressure. 3. The mitral valve is normal in structure. No evidence of mitral valve regurgitation. No evidence of mitral stenosis. 4. The aortic valve was not well visualized. Aortic valve regurgitation is not visualized. No aortic stenosis is present. 5. The inferior vena cava is dilated in size with <50% respiratory variability, suggesting right atrial pressure of 15 mmHg.  FINDINGS Left Ventricle: Left ventricular ejection fraction, by estimation, is 50 to 55%. The left ventricle has low normal function. The left ventricle demonstrates global hypokinesis. The left ventricular internal cavity size was normal in size. There is mild concentric left ventricular hypertrophy of the inferior segment. Left ventricular diastolic function could not be evaluated.  Right Ventricle: The right ventricular size is not well visualized. Right vetricular wall thickness was not assessed. Right ventricular systolic function was not well visualized. Tricuspid regurgitation signal is inadequate for  assessing PA pressure.  Left Atrium: Left atrial size was not assessed.  Right Atrium: Right atrial size was not assessed.  Pericardium: There is no evidence of pericardial effusion.  Mitral Valve: The mitral valve is normal in structure. No evidence of mitral valve regurgitation. No evidence of mitral valve stenosis.  Tricuspid Valve: The tricuspid valve is not well visualized. Tricuspid valve regurgitation is not demonstrated. No evidence of tricuspid stenosis.  Aortic Valve: The aortic valve was not well visualized. Aortic valve regurgitation is not visualized. No aortic stenosis is present.  Pulmonic Valve: The pulmonic valve was not assessed. Pulmonic valve regurgitation is not visualized. No evidence of pulmonic stenosis.  Aorta: The aortic root is normal in size and structure.  Venous: The inferior vena cava is dilated in size with less than 50% respiratory variability, suggesting  right atrial pressure of 15 mmHg.  IAS/Shunts: The interatrial septum was not assessed.   LEFT VENTRICLE PLAX 2D LVIDd:         5.50 cm LVIDs:         4.00 cm LV PW:         1.20 cm LV IVS:        0.90 cm LVOT diam:     1.90 cm LVOT Area:     2.84 cm   IVC IVC diam: 2.35 cm  LEFT ATRIUM         Index LA diam:    4.20 cm 1.77 cm/m  AORTA Ao Root diam: 3.40 cm Ao Asc diam:  3.10 cm   SHUNTS Systemic Diam: 1.90 cm  Kardie Tobb DO Electronically signed by Thomasene Ripple DO Signature Date/Time: 06/09/2023/3:26:17 PM    Final             Risk Assessment/Calculations:     CHA2DS2-VASc Score = 5   This indicates a 7.2% annual risk of stroke. The patient's score is based upon: CHF History: 0 HTN History: 1 Diabetes History: 1 Stroke History: 0 Vascular Disease History: 1 Age Score: 2 Gender Score: 0            Physical Exam:   VS:  BP 130/60   Ht 5\' 11"  (1.803 m)   Wt 261 lb (118.4 kg)   SpO2 95%   BMI 36.40 kg/m    Wt Readings from Last 3 Encounters:  11/19/23  261 lb (118.4 kg)  11/02/23 253 lb (114.8 kg)  10/30/23 253 lb (114.8 kg)    GEN: Well nourished, well developed in no acute distress NECK: No JVD; No carotid bruits CARDIAC: RRR, no murmurs, rubs, gallops RESPIRATORY:  Clear to auscultation without rales, wheezing or rhonchi  ABDOMEN: Soft, non-tender, non-distended EXTREMITIES:  No edema; No deformity   ASSESSMENT AND PLAN: .    CAD s/p CABG / bilateral arm and chest numbness - Persistent bilateral arm and chest numbness which were not anginal equivalent at time of NSTEMI but interestingly improved with nitroglycerin. Does have concomitant neuropathy for which he is on gabapentin. Plan for medical management based on 05/2023 LHC. Increase Imdur to 120mg  daily. We discussed goal to get his symptoms to tolerable point even if intermittently requires PRN nitroglycerin.   HTN - Increase Imdur to 120mg  daily. Reduce Hydralazine to 25mg  TID to prevent hypotension. Continue Amlodipine 5mg  daily. Discussed to monitor BP at home at least 2 hours after medications and sitting for 5-10 minutes.   PAF / Hypercoagulable state - RRR on auscultation. CHA2DS2-VASc Score = 5 [CHF History: 0, HTN History: 1, Diabetes History: 1, Stroke History: 0, Vascular Disease History: 1, Age Score: 2, Gender Score: 0].  Therefore, the patient's annual risk of stroke is 7.2 %.    Continue Eliquis.   DM2 - per PCP.       Dispo: follow up as scheduled  Signed, Alver Sorrow, NP

## 2023-11-19 NOTE — Patient Instructions (Addendum)
Medication Instructions:  Your physician has recommended you make the following change in your medication:  START Amlodipine 5 mg daily START Hydralazine 25 mg three times a day START Imdur 120 mg daily   Follow-Up: 01/07/2024 at 11:20 with Dr. Cristal Deer

## 2023-11-20 ENCOUNTER — Ambulatory Visit (HOSPITAL_COMMUNITY): Payer: Medicare Other

## 2023-11-23 ENCOUNTER — Inpatient Hospital Stay (HOSPITAL_COMMUNITY): Admission: RE | Admit: 2023-11-23 | Payer: Medicare Other | Source: Ambulatory Visit

## 2023-11-25 ENCOUNTER — Telehealth (HOSPITAL_COMMUNITY): Payer: Self-pay | Admitting: *Deleted

## 2023-11-25 ENCOUNTER — Encounter (HOSPITAL_COMMUNITY): Payer: Self-pay | Admitting: *Deleted

## 2023-11-25 ENCOUNTER — Ambulatory Visit (HOSPITAL_COMMUNITY): Payer: Medicare Other

## 2023-11-25 DIAGNOSIS — I129 Hypertensive chronic kidney disease with stage 1 through stage 4 chronic kidney disease, or unspecified chronic kidney disease: Secondary | ICD-10-CM | POA: Diagnosis not present

## 2023-11-25 DIAGNOSIS — E1122 Type 2 diabetes mellitus with diabetic chronic kidney disease: Secondary | ICD-10-CM | POA: Diagnosis not present

## 2023-11-25 DIAGNOSIS — R809 Proteinuria, unspecified: Secondary | ICD-10-CM | POA: Diagnosis not present

## 2023-11-25 DIAGNOSIS — N1832 Chronic kidney disease, stage 3b: Secondary | ICD-10-CM | POA: Diagnosis not present

## 2023-11-25 NOTE — Telephone Encounter (Signed)
Spoke with [name]. [name] does not plan to return to exercise. Will discharge from cardiac rehab as requested.Thayer Headings RN BSN

## 2023-11-25 NOTE — Progress Notes (Signed)
Discharge Progress Report  Patient Details  Name: [name] MRN: [id number] Date of Birth: [birth year] Referring Provider:   Flowsheet Row INTENSIVE CARDIAC REHAB ORIENT from 08/11/2023 in [facility]  Referring Provider Bill Red, MD        Number of Visits: 37  Reason for Discharge:  Early Exit:  Lack of attendance  Smoking History:  Social History   Tobacco Use  Smoking Status Former   Current packs/day: 0.00   Types: Cigarettes   Quit date: 12/14/1975   Years since quitting: 47.9  Smokeless Tobacco Never    Diagnosis:  No diagnosis found.  ADL UCSD:   Initial Exercise Prescription:   Discharge Exercise Prescription (Final Exercise Prescription Changes):  Exercise Prescription Changes - 10/16/23 1617       Response to Exercise   Blood Pressure (Admit) 136/74    Blood Pressure (Exit) 114/58    Heart Rate (Admit) 71 bpm    Heart Rate (Exercise) 97 bpm    Heart Rate (Exit) 63 bpm    Rating of Perceived Exertion (Exercise) 13    Perceived Dyspnea (Exercise) 0    Symptoms Some fatigue pain in arms and legs, patient was pushing too hard, talked about moderation in exercise intensity    Comments REVD MET's    Duration Progress to 30 minutes of  aerobic without signs/symptoms of physical distress    Intensity THRR unchanged      Progression   Progression Continue to progress workloads to maintain intensity without signs/symptoms of physical distress.    Average METs 4      Resistance Training   Training Prescription Yes    Weight 3 lbs wts    Reps 10-15    Time 10 Minutes      T5 Nustep   Level 4    SPM 101    Minutes 23    METs 4      Home Exercise Plan   Plans to continue exercise at Home (comment)    Frequency Add 2 additional days to program exercise sessions.    Initial Home Exercises Provided 09/04/23             Functional Capacity:   Psychological,  QOL, Others - Outcomes: PHQ 2/9:    08/11/2023   12:12 PM 06/12/2023    9:55 AM 12/16/2022    1:46 PM 12/10/2022   10:02 AM 07/07/2022   10:32 AM  Depression screen PHQ 2/9  Decreased Interest 2 3 0 0 0  Down, Depressed, Hopeless 0 2 0 0 0  PHQ - 2 Score 2 5 0 0 0  Altered sleeping 0 0 0 0 0  Tired, decreased energy 2 3 0 2 0  Change in appetite 1 3 0 3 0  Feeling bad or failure about yourself  0 3 0 0 0  Trouble concentrating 1 3 0 0 0  Moving slowly or fidgety/restless 0 0 0 0 0  Suicidal thoughts 0 0 0 0 0  PHQ-9 Score 6 17 0 5 0  Difficult doing work/chores Somewhat difficult Very difficult Not difficult at all Not difficult at all     Quality of Life:   Personal Goals: Goals established at orientation with interventions provided to work toward goal.    Personal Goals Discharge:  Goals and Risk Factor Review     Row Name 10/06/23 1543 11/03/23 1612 11/25/23 1333  Core Components/Risk Factors/Patient Goals Review   Personal Goals Review Weight Management/Obesity;Diabetes;Improve shortness of breath with ADL's;Hypertension;Lipids;Stress Weight Management/Obesity;Diabetes;Improve shortness of breath with ADL's;Hypertension;Lipids;Stress Weight Management/Obesity;Diabetes;Improve shortness of breath with ADL's;Hypertension;Lipids;Stress     Review [name] has done well with exercise at cardiac rehab.  Vital signs and CBg's have been stable upon [name]'s return to exercise. Takes multiple rest breaks when walking the track has a high met level on the nustep. [name] has not exercised at cardiac rehab since 10/14/23. [name] haas been cleared to return to exercise per Dr Cristal Deer. [name] has been discharged from cardiac rehab as requested     Expected Outcomes [name] will continue to participate in cardiac rehab for exercise, nutriiton and lifestyle modifications [name] will continue to participate in cardiac rehab for exercise, nutriiton and lifestyle modifications [name] will continue to exercise,  and  follow nutriiton and lifestyle modifications as tolerated              Exercise Goals and Review:   Exercise Goals Re-Evaluation:  Exercise Goals Re-Evaluation     Row Name 10/05/23 1658             Exercise Goal Re-Evaluation   Exercise Goals Review Increase Physical Activity;Understanding of Exercise Prescription;Increase Strength and Stamina;Knowledge and understanding of Target Heart Rate Range (THRR);Able to understand and use rate of perceived exertion (RPE) scale       Comments Reviewed MET's and goals. Pt tolerated exercise well with an average MET level of 2.74. Pt is feeling good about his goals and is increasing leg strength and stamina. He is continuing to have INC SOB, but SaO2 ok at 96%       Expected Outcomes Will continue to monitor pt and progress workloads as tolerated without sign or symptom                Nutrition & Weight - Outcomes:    Nutrition:  Nutrition Therapy & Goals - 10/20/23 1446       Nutrition Therapy   Diet Heart healthy/Carbohydrate Consistent Diet    Drug/Food Interactions Statins/Certain Fruits      Personal Nutrition Goals   Nutrition Goal Patient to identify strategies for reducing cardiovascular risk by attending the [facility] education and nutrition series weekly.   goal in action.   Personal Goal #2 Patient to improve diet quality by using the plate method as a guide for meal planning to include lean protein/plant protein, fruits, vegetables, whole grains, nonfat dairy as part of a well-balanced diet.   goal in progress.   Personal Goal #3 Patient to reduce sodium intake to 1500mg  per day   goal in progress.   Personal Goal #4 Patient to identify strategies for improving blood sugar control with goal A1c <7%   goal in progress.   Comments Goals in progress. [name] has medical history CKD3, pancreatic cancer, HTN, Afib, CAD, PAD, heart failure. He continues to attend the [facility] education and nutrition series regularly. Per last  cardiology follow-up on 10/1, added hydralazine to aid with variable blood pressure readings and discussed the addition of GLP-1 medication. LDL remains well controlled <55. He is up 2.6# since starting with our program. His wife is a good support. Patient will benefit from participation in intensive cardiac rehab for nutrition, exercise, and lifestyle modification.      Intervention Plan   Intervention Prescribe, educate and counsel regarding individualized specific dietary modifications aiming towards targeted core components such as weight, hypertension, lipid management, diabetes, heart failure and other comorbidities.;Nutrition  handout(s) given to patient.    Expected Outcomes Short Term Goal: Understand basic principles of dietary content, such as calories, fat, sodium, cholesterol and nutrients.;Long Term Goal: Adherence to prescribed nutrition plan.             Nutrition Discharge:   Education Questionnaire Score:   [name]  completed 37  exercise and education sessions between 08/12/23- 11/09/23.Marland Kitchen Pt maintained good attendance and progressed nicely during their participation in rehab as evidenced by increased MET level. [name] did not return to exercise at cardiac rehab and has been discharged as requested.Thayer Headings RN BSN

## 2023-11-27 ENCOUNTER — Ambulatory Visit (HOSPITAL_COMMUNITY): Payer: Medicare Other

## 2023-11-27 ENCOUNTER — Inpatient Hospital Stay: Payer: Medicare Other | Attending: Nurse Practitioner

## 2023-11-27 VITALS — BP 138/76 | HR 65 | Temp 99.7°F | Resp 18

## 2023-11-27 DIAGNOSIS — C259 Malignant neoplasm of pancreas, unspecified: Secondary | ICD-10-CM | POA: Diagnosis not present

## 2023-11-27 DIAGNOSIS — Z452 Encounter for adjustment and management of vascular access device: Secondary | ICD-10-CM | POA: Insufficient documentation

## 2023-11-27 DIAGNOSIS — Z95828 Presence of other vascular implants and grafts: Secondary | ICD-10-CM

## 2023-11-27 MED ORDER — SODIUM CHLORIDE 0.9% FLUSH
10.0000 mL | INTRAVENOUS | Status: DC | PRN
  Administered 2023-11-27: 10 mL via INTRAVENOUS

## 2023-11-27 MED ORDER — HEPARIN SOD (PORK) LOCK FLUSH 100 UNIT/ML IV SOLN
500.0000 [IU] | Freq: Once | INTRAVENOUS | Status: AC
  Administered 2023-11-27: 500 [IU] via INTRAVENOUS

## 2023-12-03 ENCOUNTER — Ambulatory Visit (HOSPITAL_BASED_OUTPATIENT_CLINIC_OR_DEPARTMENT_OTHER): Payer: Medicare Other | Admitting: *Deleted

## 2023-12-03 VITALS — BP 126/60 | HR 55 | Ht 70.0 in | Wt 253.9 lb

## 2023-12-03 DIAGNOSIS — I10 Essential (primary) hypertension: Secondary | ICD-10-CM | POA: Diagnosis not present

## 2023-12-03 NOTE — Progress Notes (Signed)
   Nurse Visit   Date of Encounter: 12/03/2023 ID: [name], DOB 05/12/1946, MRN [id number]  PCP:  [place], [name] G, MD   [facility] Providers Cardiologist:  Jodelle Red, MD      Visit Details   VS:  BP 126/60   Pulse (!) 55   Ht 5\' 10"  (1.778 m)   Wt 253 lb 14.4 oz (115.2 kg)   BMI 36.43 kg/m  , BMI Body mass index is 36.43 kg/m.  Wt Readings from Last 3 Encounters:  12/03/23 253 lb 14.4 oz (115.2 kg)  11/19/23 261 lb (118.4 kg)  11/02/23 253 lb (114.8 kg)     Reason for visit: blood pressure check   Performed today: Vitals and Provider consulted:Caitlin W NP   Patient here for blood pressure check with home machine to compare with our reading. Checked blood pressure 130/60 manually, his machine 143/70, again manually 126/60. Discussed with NP   Changes (medications, testing, etc.) : no changes  Length of Visit: 15 minutes    Medications Adjustments/Labs and Tests Ordered: No orders of the defined types were placed in this encounter.  No orders of the defined types were placed in this encounter.    Candace Gallus, LPN  24/40/1027 2:40 PM

## 2023-12-03 NOTE — Patient Instructions (Signed)
Your blood pressure machine is reading about 13 points higher than readings in the office Your machine blood pressure today 143/70, manual check 130/60 and 126/60 You can try using the adaptor at home and see if readings are the same   Continue your current medications and keep follow up as scheduled

## 2023-12-07 NOTE — Addendum Note (Signed)
Encounter addended by: Hughie Closs S on: 12/07/2023 4:23 PM  Actions taken: Flowsheet accepted

## 2023-12-14 ENCOUNTER — Encounter: Payer: Self-pay | Admitting: Podiatry

## 2023-12-14 ENCOUNTER — Ambulatory Visit (INDEPENDENT_AMBULATORY_CARE_PROVIDER_SITE_OTHER): Payer: Medicare Other | Admitting: Podiatry

## 2023-12-14 VITALS — Ht 70.0 in | Wt 253.9 lb

## 2023-12-14 DIAGNOSIS — M79674 Pain in right toe(s): Secondary | ICD-10-CM

## 2023-12-14 DIAGNOSIS — E1151 Type 2 diabetes mellitus with diabetic peripheral angiopathy without gangrene: Secondary | ICD-10-CM

## 2023-12-14 DIAGNOSIS — M79675 Pain in left toe(s): Secondary | ICD-10-CM | POA: Diagnosis not present

## 2023-12-14 DIAGNOSIS — B351 Tinea unguium: Secondary | ICD-10-CM | POA: Diagnosis not present

## 2023-12-14 DIAGNOSIS — E1142 Type 2 diabetes mellitus with diabetic polyneuropathy: Secondary | ICD-10-CM

## 2023-12-14 DIAGNOSIS — L84 Corns and callosities: Secondary | ICD-10-CM | POA: Diagnosis not present

## 2023-12-14 MED ORDER — AMMONIUM LACTATE 12 % EX CREA: 1.0000 | TOPICAL_CREAM | CUTANEOUS | 3 refills | Status: DC | PRN

## 2023-12-14 NOTE — Progress Notes (Signed)
Subjective:  Patient ID: [name], male    DOB: [birth year],  MRN: [id number]  [name] presents to clinic today for:  Chief Complaint  Patient presents with   Diabetic Ulcer    Pt is here to f/u on ulcer on right foot, pt states the pain is still there and he doesn't believes its healing as it should.   Patient notes nails are thick and elongated, causing pain in shoe gear when ambulating.  He notes his right submet 5 callus has been painful.  He states it is so painful he feels it is going to cause him to have a heart attack.  However with further questioning, he noted this is only happening at night.  We have also noted the fifth met head in his shoes.  He states when he wears his shoes he has no pain.  When he wears his house slippers in the evening around the house, he still has no pain.  The pain is only present when he is off the foot in bed.  This is making me question whether this is even the callus that is causing so much discomfort for him.  He is currently taking 300 mg gabapentin nightly  PCP is [name], Bill G, MD. 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   Lisinopril Other (See Comments)    Unknown   Terazosin Other (See Comments)    Unknown    Objective:  Edman Kava is a pleasant 77 y.o. male in NAD. AAO x 3.  Vascular Examination: Patient has palpable DP pulse, absent PT pulse bilateral.  Delayed capillary refill bilateral toes.  Sparse digital hair bilateral.  Proximal to distal cooling WNL bilateral.    Dermatological Examination: Interspaces are clear with no open lesions noted bilateral.  Skin is shiny and atrophic bilateral.  Nails are 3-62mm thick, with yellowish/brown discoloration, subungual debris and distal onycholysis x10.  There is pain with compression of nails x10.  There are hyperkeratotic lesions noted right submet 5.  This is not ulcerated today.  The skin is dry to the bilateral feet and heels.  Neurological Examination: Protective sensation diminished bilateral forefoot.  Light touch sensation diminished bilateral forefoot     Latest Ref Rng & Units 06/09/2023    6:36 AM  Hemoglobin A1C  Hemoglobin-A1c 4.8 - 5.6 % 7.5    Patient qualifies for at-risk foot care because of diabetes with PVD.  Assessment/Plan: 1. Pain due to onychomycosis of toenails of both feet   2. Pre-ulcerative calluses   3. Type II diabetes mellitus with peripheral circulatory disorder (HCC)   4. Type 2 diabetes mellitus with peripheral neuropathy (HCC)     Meds ordered this encounter  Medications   ammonium lactate (AMLACTIN) 12 % cream    Sig: Apply 1 Application topically as needed for dry skin.    Dispense:  385 [name]    Refill:  3   Mycotic nails x10 were sharply debrided with sterile nail nippers and power debriding burr to decrease bulk and length.  Hyperkeratotic lesion right submet 5 shaved with #312 blade.  Reviewed patient's medication history.  He currently takes 300 mg gabapentin nightly.  Due to the pain in the  right side he is feeling at night, and I recommend he increase the dosage to 600 mg (two capsules) gabapentin nightly.  If he needs refills of the new dose he can call our office and we will send this in.  Prescription for ammonium lactate cream sent to his pharmacy to apply to the skin of the feet and the heels once daily.  He is ABIs and TBI's from 09/25/2021 were fairly normal, if he continues to have pain even with the increased dosage of gabapentin will set him up for new noninvasive vascular studies as well.  Return in about 3 months (around 03/13/2024) for [facility].   Clerance Lav, DPM, FACFAS [facility]     [address] [place], [place] [address]                Office [phone]  Fax [phone]

## 2023-12-15 DIAGNOSIS — H16002 Unspecified corneal ulcer, left eye: Secondary | ICD-10-CM | POA: Diagnosis not present

## 2023-12-17 DIAGNOSIS — H16002 Unspecified corneal ulcer, left eye: Secondary | ICD-10-CM | POA: Diagnosis not present

## 2023-12-18 ENCOUNTER — Encounter: Payer: Self-pay | Admitting: Oncology

## 2023-12-20 ENCOUNTER — Encounter: Payer: Self-pay | Admitting: Family Medicine

## 2023-12-21 ENCOUNTER — Ambulatory Visit (INDEPENDENT_AMBULATORY_CARE_PROVIDER_SITE_OTHER): Payer: Medicare Other

## 2023-12-21 VITALS — Ht 70.0 in | Wt 253.0 lb

## 2023-12-21 DIAGNOSIS — H16002 Unspecified corneal ulcer, left eye: Secondary | ICD-10-CM | POA: Diagnosis not present

## 2023-12-21 DIAGNOSIS — Z0000 Encounter for general adult medical examination without abnormal findings: Secondary | ICD-10-CM

## 2023-12-21 MED ORDER — FREESTYLE LITE TEST VI STRP: ORAL_STRIP | 2 refills | Status: DC

## 2023-12-21 NOTE — Progress Notes (Signed)
 Subjective:   [name] is a 78 y.o. male who presents for Medicare Annual/Subsequent preventive examination.  Visit Complete: Virtual I connected with  [name] on 12/21/23 by a audio enabled telemedicine application and verified that I am speaking with the correct person using two identifiers.  Patient Location: Home  Provider Location: Home Office  I discussed the limitations of evaluation and management by telemedicine. The patient expressed understanding and agreed to proceed.  Vital Signs: Because this visit was a virtual/telehealth visit, some criteria may be missing or patient reported. Any vitals not documented were not able to be obtained and vitals that have been documented are patient reported.  Patient Medicare AWV questionnaire was completed by the patient on 12/20/23; I have confirmed that all information answered by patient is correct and no changes since this date.  Cardiac Risk Factors include: advanced age (>24men, >22 women);diabetes mellitus;male gender;hypertension     Objective:    Today's Vitals   12/21/23 1307  Weight: 253 lb (114.8 kg)  Height: 5' 10 (1.778 m)   Body mass index is 36.3 kg/m.     12/21/2023    1:19 PM 09/07/2023    5:54 PM 09/07/2023    5:52 PM 08/07/2023   11:35 AM 06/09/2023   12:00 AM 06/08/2023   10:25 AM 06/08/2023   10:07 AM  Advanced Directives  Does Patient Have a Medical Advance Directive? Yes Yes Yes Yes Yes Yes Yes  Type of Estate Agent of [place];Living will Healthcare Power of [employer] Power of [employer] Power of [place];Living will Living will;Healthcare Power of Attorney Living will;Healthcare Power of Attorney   Does patient want to make changes to medical advance directive? No - Patient declined   No - Patient declined No - Patient declined    Copy of Healthcare Power of Attorney in Chart? Yes - validated most recent copy scanned in chart (See row  information)   Yes - validated most recent copy scanned in chart (See row information) No - copy requested      Current Medications (verified) Outpatient Encounter Medications as of 12/21/2023  Medication Sig   acetaminophen  (TYLENOL ) 500 MG tablet Take 1,000 mg by mouth in the morning, at noon, and at bedtime.   amLODipine  (NORVASC ) 5 MG tablet Take 1 tablet (5 mg total) by mouth daily.   ammonium lactate  (AMLACTIN) 12 % cream Apply 1 Application topically as needed for dry skin.   apixaban  (ELIQUIS ) 5 MG TABS tablet Take 5 mg by mouth 2 (two) times daily.   aspirin  EC 81 MG tablet Take 1 tablet (81 mg total) by mouth daily then as directed by provider. Swallow whole.   atorvastatin  (LIPITOR) 80 MG tablet Take 40 mg by mouth at bedtime.   Calcium  Carbonate Antacid (TUMS PO) Take 2 tablets by mouth as needed (reflux).   calcium -vitamin D  (OSCAL WITH D) 500-200 MG-UNIT tablet Take 1 tablet by mouth 2 (two) times daily.   CRANBERRY PO Take 1 tablet by mouth in the morning and at bedtime.   empagliflozin  (JARDIANCE ) 10 MG TABS tablet Take 1 tablet (10 mg total) by mouth daily.   ferrous sulfate  325 (65 FE) MG tablet Take 1 tablet (325 mg total) by mouth daily with breakfast.   FLUoxetine  (PROZAC ) 20 MG capsule Take 40 mg by mouth daily.   furosemide  (LASIX ) 40 MG tablet Take 1 tablet (40 mg total) by mouth daily.   gabapentin  (NEURONTIN ) 300 MG capsule Take 1  capsule (300 mg total) by mouth at bedtime.   glucose blood (FREESTYLE LITE) test strip Use to check blood sugar 1-2 times daily.   hydrALAZINE  (APRESOLINE ) 25 MG tablet Take 1 tablet (25 mg total) by mouth 3 (three) times daily.   insulin  aspart (NOVOLOG ) 100 UNIT/ML injection Inject 3-5 Units into the skin See admin instructions. Inject 3 units at lunch time and 5 units at dinner   insulin  glargine (LANTUS ) 100 UNIT/ML injection Inject 0.5 mLs (50 Units total) into the skin daily.   isosorbide  mononitrate (IMDUR ) 120 MG 24 hr tablet Take 1  tablet (120 mg total) by mouth daily.   metoprolol  tartrate (LOPRESSOR ) 25 MG tablet TAKE 1/2 TABLET(12.5 MG) BY MOUTH TWICE DAILY   Multiple Vitamin (MULTI-VITAMINS) TABS Take 1 tablet by mouth daily.   nitroGLYCERIN  (NITROSTAT ) 0.3 MG SL tablet Place 0.3 mg under the tongue every 5 (five) minutes as needed for chest pain.   pantoprazole  (PROTONIX ) 40 MG tablet Take 1 tablet (40 mg total) by mouth daily.   Probiotic Product (PROBIOTIC ADVANCED PO) Take 1 capsule by mouth at bedtime.   tamsulosin  (FLOMAX ) 0.4 MG CAPS capsule Take 0.8 mg by mouth at bedtime.   traZODone  (DESYREL ) 100 MG tablet Take 100 mg by mouth at bedtime.   vitamin B-12 (CYANOCOBALAMIN ) 1000 MCG tablet Take 1,000 mcg by mouth daily.   Wheat Dextrin (BENEFIBER DRINK MIX PO) Take 1 Scoop by mouth at bedtime.   No facility-administered encounter medications on file as of 12/21/2023.    Allergies (verified) Keflex  [cephalexin ], Lisinopril, and Terazosin   History: Past Medical History:  Diagnosis Date   A-fib (HCC) 03/04/2021   Anemia    Arthritis    Cancer (HCC)    prostate   Chronic kidney disease    blood in urine    Constipation    Coronary artery disease    Depression    Diabetes mellitus without complication (HCC)    Difficult intubation    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 it was hard to get the tube down his throat   Dyspnea    Family history of breast cancer    Fatty liver    Fatty liver    Frequent headaches    GERD (gastroesophageal reflux disease)    History of chicken pox    History of fainting spells of unknown cause    History of prostate cancer    Hyperlipidemia    Hypertension    Myocardial infarction [facility])    Peripheral neuropathy    Pneumonia    Prostate cancer (HCC)    PTSD (post-traumatic stress disorder)    Sleep apnea    uses Cpap   Past Surgical History:  Procedure Laterality Date   APPENDECTOMY     BIOPSY  01/15/2021   Procedure: BIOPSY;  Surgeon: Wilhelmenia Aloha Raddle., MD;   Location: [facility] ENDOSCOPY;  Service: Gastroenterology;;   CARDIAC CATHETERIZATION  06/26/2017   CARDIAC SURGERY     Triple Bypass   CHOLECYSTECTOMY  2010   COLONOSCOPY     CORONARY ARTERY BYPASS GRAFT  2004   ESOPHAGOGASTRODUODENOSCOPY (EGD) WITH PROPOFOL  N/A 01/15/2021   Procedure: ESOPHAGOGASTRODUODENOSCOPY (EGD) WITH PROPOFOL ;  Surgeon: Wilhelmenia Aloha Raddle., MD;  Location: [facility] ENDOSCOPY;  Service: Gastroenterology;  Laterality: N/A;   EUS N/A 01/15/2021   Procedure: UPPER ENDOSCOPIC ULTRASOUND (EUS) RADIAL;  Surgeon: Wilhelmenia Aloha Raddle., MD;  Location: [facility] ENDOSCOPY;  Service: Gastroenterology;  Laterality: N/A;   EYE SURGERY Bilateral    cataract removal  FINE NEEDLE ASPIRATION  01/15/2021   Procedure: FINE NEEDLE ASPIRATION (FNA) LINEAR;  Surgeon: Wilhelmenia Aloha Raddle., MD;  Location: [facility] ENDOSCOPY;  Service: Gastroenterology;;   LAPAROSCOPY N/A 07/01/2021   Procedure: STAGING LAPAROSCOPY;  Surgeon: Dasie Leonor CROME, MD;  Location: [facility] OR;  Service: General;  Laterality: N/A;   LEFT HEART CATH AND CORS/GRAFTS ANGIOGRAPHY N/A 06/09/2023   Procedure: LEFT HEART CATH AND CORS/GRAFTS ANGIOGRAPHY;  Surgeon: Wendel Lurena POUR, MD;  Location: [facility] INVASIVE CV LAB;  Service: Cardiovascular;  Laterality: N/A;   PORTACATH PLACEMENT Right 02/21/2021   Procedure: INSERTION PORT-A-CATH;  Surgeon: Dasie Leonor CROME, MD;  Location: [facility] OR;  Service: General;  Laterality: Right;   SPLENECTOMY, 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   Procedure: SPLENECTOMY;  Surgeon: Dasie Leonor CROME, MD;  Location: [facility] OR;  Service: General;  Laterality: N/A;   TONSILLECTOMY  1958   Family History  Problem Relation Age of Onset   Arthritis Mother    Heart disease Mother    Hyperlipidemia Mother    Hypertension Mother    Heart attack Mother    Breast cancer Sister        dx 30s   Heart attack Brother    Heart disease Brother    Depression Daughter    Arthritis Sister    Cancer Sister        unknown type, dx 46s   Cancer Brother     Learning disabilities Brother    Alcohol  abuse Brother    COPD Brother    Heart attack Brother    Heart disease Brother    Social History   Socioeconomic History   Marital status: Married    Spouse name: [name]   Number of children: 2   Years of education: Not on file   Highest education level: Some college, no degree  Occupational History   Occupation: retired  Tobacco Use   Smoking status: Former    Current packs/day: 0.00    Types: Cigarettes    Quit date: 12/14/1975    Years since quitting: 48.0   Smokeless tobacco: Never  Vaping Use   Vaping status: Not on file  Substance and Sexual Activity   Alcohol  use: Not Currently   Drug use: Never   Sexual activity: Not Currently  Other Topics Concern   Not on file  Social History Narrative   ** Merged History Encounter **       Social Drivers of Health   Financial Resource Strain: Low Risk  (12/21/2023)   Overall Financial Resource Strain (CARDIA)    Difficulty of Paying Living Expenses: Not hard at all  Food Insecurity: No Food Insecurity (12/21/2023)   Hunger Vital Sign    Worried About Running Out of Food in the Last Year: Never true    Ran Out of Food in the Last Year: Never true  Transportation Needs: No Transportation Needs (12/21/2023)   PRAPARE - Administrator, Civil Service (Medical): No    Lack of Transportation (Non-Medical): No  Physical Activity: Inactive (12/21/2023)   Exercise Vital Sign    Days of Exercise per Week: 0 days    Minutes of Exercise per Session: 0 min  Stress: No Stress Concern Present (12/21/2023)   [employer] of Occupational Health - Occupational Stress Questionnaire    Feeling of Stress : Not at all  Recent Concern: Stress - Stress Concern Present (10/29/2023)   [employer] of Occupational Health - Occupational Stress Questionnaire    Feeling of Stress : To  some extent  Social Connections: Socially Integrated (12/21/2023)   Social Connection and Isolation Panel  [NHANES]    Frequency of Communication with Friends and Family: More than three times a week    Frequency of Social Gatherings with Friends and Family: More than three times a week    Attends Religious Services: More than 4 times per year    Active Member of [employer] or Organizations: Yes    Attends Engineer, Structural: More than 4 times per year    Marital Status: Married    Tobacco Counseling Counseling given: Not Answered   Clinical Intake:  Pre-visit preparation completed: Yes  Pain : No/denies pain     BMI - recorded: 36.3 Nutritional Risks: None Diabetes: Yes CBG done?: No Did pt. bring in CBG monitor from home?: No  How often do you need to have someone help you when you read instructions, pamphlets, or other written materials from your doctor or pharmacy?: 1 - Never  Interpreter Needed?: No  Information entered by :: Bill Blush LPN   Activities of Daily Living    12/21/2023    1:15 PM 12/20/2023    4:13 PM  In your present state of health, do you have any difficulty performing the following activities:  Hearing? 0 0  Vision? 0 0  Difficulty concentrating or making decisions? 1 1  Comment Wife assist   Walking or climbing stairs? 1 1  Comment Uses a Restaurant Manager, [employer]   Dressing or bathing? 0 0  Doing errands, shopping? 0 0  Preparing Food and eating ? N N  Using the Toilet? N N  In the past six months, have you accidently leaked urine? Y Y  Comment Wears pads Followed by a Urologist   Do you have problems with loss of bowel control? N N  Managing your Medications? N N  Managing your Finances? N N  Housekeeping or managing your Housekeeping? N N    Patient Care Team: Jordan, [name] G, MD as PCP - General (Family Medicine) Lonni Slain, MD as PCP - Cardiology (Cardiology) Cloretta Arley NOVAK, MD as Consulting Physician (Oncology)  Indicate any recent Medical Services you may have received from other than [facility] providers in the past year  (date may be approximate).     Assessment:   This is a routine wellness examination for [name].  Hearing/Vision screen Hearing Screening - Comments:: Denies hearing difficulties   Vision Screening - Comments:: Wears rx glasses - up to date with routine eye exams with  [facility]   Goals Addressed               This Visit's Progress     Increase physical activity (pt-stated)         Depression Screen    12/21/2023    1:15 PM 08/11/2023   12:12 PM 06/12/2023    9:55 AM 12/16/2022    1:46 PM 12/10/2022   10:02 AM 07/07/2022   10:32 AM 04/08/2022    2:20 PM  PHQ 2/9 Scores  PHQ - 2 Score 0 2 5 0 0 0 2  PHQ- 9 Score  6 17 0 5 0 10    Fall Risk    12/21/2023    1:17 PM 12/20/2023    4:13 PM 11/09/2023    2:55 PM 10/16/2023    3:02 PM 10/14/2023    3:07 PM  Fall Risk   Falls in the past year? 1 1 0 0 0  Number falls  in past yr: 0 0 0 0 0  Injury with Fall? 1 1 0 0 0  Comment Skin tear rt arm. W/o medical attention      Risk for fall due to : No Fall Risks  Impaired balance/gait;Impaired mobility;Impaired vision;Orthopedic patient Impaired balance/gait;Impaired mobility;Impaired vision;Orthopedic patient Impaired balance/gait;Impaired mobility;History of fall(s);Impaired vision;Orthopedic patient  Follow up Falls prevention discussed  Falls evaluation completed Falls evaluation completed Falls evaluation completed    MEDICARE RISK AT HOME: Medicare Risk at Home Any stairs in or around the home?: (Patient-Rptd) Yes If so, are there any without handrails?: (Patient-Rptd) No Home free of loose throw rugs in walkways, pet beds, electrical cords, etc?: (Patient-Rptd) Yes Adequate lighting in your home to reduce risk of falls?: (Patient-Rptd) Yes Life alert?: (Patient-Rptd) No Use of a cane, walker or w/c?: (Patient-Rptd) Yes Grab bars in the bathroom?: (Patient-Rptd) Yes Shower chair or bench in shower?: (Patient-Rptd) Yes Elevated toilet seat or a handicapped toilet?:  (Patient-Rptd) No  TIMED UP AND GO:  Was the test performed?  No    Cognitive Function:        12/21/2023    1:19 PM 12/16/2022    1:50 PM 12/06/2021   10:01 AM  6CIT Screen  What Year? 0 points 0 points 0 points  What month? 0 points 0 points 0 points  What time? 0 points 0 points 0 points  Count back from 20 0 points 0 points 0 points  Months in reverse 0 points 0 points 0 points  Repeat phrase 0 points 0 points 2 points  Total Score 0 points 0 points 2 points    Immunizations Immunization History  Administered Date(s) Administered   Fluad Quad(high Dose 65+) 08/31/2019, 10/10/2020, 09/26/2021   HIB (PRP-OMP) 02/08/2021   Influenza, High Dose Seasonal PF 10/07/2022   Influenza-Unspecified 10/17/1997, 09/14/2002, 11/24/2002, 09/29/2003, 10/15/2004, 10/15/2005, 09/28/2006, 11/09/2007, 10/06/2008, 10/17/2010, 10/16/2011, 10/15/2012, 09/13/2013, 09/14/2014, 10/09/2015, 10/29/2016, 10/08/2017, 07/15/2018, 08/31/2019, 09/04/2023   Meningococcal B, OMV 07/09/2021, 11/09/2021, 11/06/2023   Meningococcal Mcv4o 02/08/2021, 07/09/2021   PFIZER(Purple Top)SARS-COV-2 Vaccination 01/19/2020, 02/14/2020, 09/25/2020   Pfizer Covid-19 Vaccine Bivalent Booster 74yrs & up 09/24/2021   Pfizer(Comirnaty)Fall Seasonal Vaccine 12 years and older 10/07/2022   Pneumococcal Conjugate-13 11/09/2013   Pneumococcal Polysaccharide-23 02/13/2005, 08/12/2012, 09/21/2019   Respiratory Syncytial Virus Vaccine,Recomb Aduvanted(Arexvy) 10/07/2022   Tdap 09/18/2011, 06/09/2018, 09/22/2021   Zoster Recombinant(Shingrix) 06/09/2018, 09/01/2018   Zoster, Live 01/12/2012    TDAP status: Up to date  Flu Vaccine status: Up to date  Pneumococcal vaccine status: Up to date  Covid-19 vaccine status: Declined, Education has been provided regarding the importance of this vaccine but patient still declined. Advised may receive this vaccine at local pharmacy or Health Dept.or vaccine clinic. Aware to provide a copy of  the vaccination record if obtained from local pharmacy or Health Dept. Verbalized acceptance and understanding.  Qualifies for Shingles Vaccine? Yes   Zostavax completed Yes   Shingrix Completed?: Yes  Screening Tests Health Maintenance  Topic Date Due   Meningococcal B Vaccine (1 of 4 - Increased Risk) 10/25/1956   Diabetic kidney evaluation - Urine ACR  10/10/2021   OPHTHALMOLOGY EXAM  12/18/2021   COVID-19 Vaccine (6 - 2024-25 season) 08/16/2023   HEMOGLOBIN A1C  12/09/2023   Diabetic kidney evaluation - eGFR measurement  09/06/2024   FOOT EXAM  10/29/2024   Medicare Annual Wellness (AWV)  12/20/2024   DTaP/Tdap/Td (4 - Td or Tdap) 09/23/2031   Pneumonia Vaccine 76+ Years old  Completed  INFLUENZA VACCINE  Completed   Zoster Vaccines- Shingrix  Completed   HPV VACCINES  Aged Out   Colonoscopy  Discontinued   Hepatitis C Screening  Discontinued    Health Maintenance  Health Maintenance Due  Topic Date Due   Meningococcal B Vaccine (1 of 4 - Increased Risk) 10/25/1956   Diabetic kidney evaluation - Urine ACR  10/10/2021   OPHTHALMOLOGY EXAM  12/18/2021   COVID-19 Vaccine (6 - 2024-25 season) 08/16/2023   HEMOGLOBIN A1C  12/09/2023         Additional Screening:    Vision Screening: Recommended annual ophthalmology exams for early detection of glaucoma and other disorders of the eye. Is the patient up to date with their annual eye exam?  Yes  Who is the provider or what is the name of the office in which the patient attends annual eye exams? [facility] If pt is not established with a provider, would they like to be referred to a provider to establish care? No .   Dental Screening: Recommended annual dental exams for proper oral hygiene  Diabetic Foot Exam: Diabetic Foot Exam: Completed 10/30/23  Community Resource Referral / Chronic Care Management:  CRR required this visit?  No   CCM required this visit?  No     Plan:     I have personally  reviewed and noted the following in the patient's chart:   Medical and social history Use of alcohol , tobacco or illicit drugs  Current medications and supplements including opioid prescriptions. Patient is not currently taking opioid prescriptions. Functional ability and status Nutritional status Physical activity Advanced directives List of other physicians Hospitalizations, surgeries, and ER visits in previous 12 months Vitals Screenings to include cognitive, depression, and falls Referrals and appointments  In addition, I have reviewed and discussed with patient certain preventive protocols, quality metrics, and best practice recommendations. A written personalized care plan for preventive services as well as general preventive health recommendations were provided to patient.     Bill LELON Blush, LPN   07/17/7973   After Visit Summary: (MyChart) Due to this being a telephonic visit, the after visit summary with patients personalized plan was offered to patient via MyChart   Nurse Notes: None

## 2023-12-21 NOTE — Patient Instructions (Addendum)
 [name] , Thank you for taking time to come for your Medicare Wellness Visit. I appreciate your ongoing commitment to your health goals. Please review the following plan we discussed and let me know if I can assist you in the future.   Referrals/Orders/Follow-Ups/Clinician Recommendations:   This is a list of the screening recommended for you and due dates:  Health Maintenance  Topic Date Due   Meningitis B Vaccine (1 of 4 - Increased Risk) 10/25/1956   Yearly kidney health urinalysis for diabetes  10/10/2021   Eye exam for diabetics  12/18/2021   COVID-19 Vaccine (6 - 2024-25 season) 08/16/2023   Hemoglobin A1C  12/09/2023   Yearly kidney function blood test for diabetes  09/06/2024   Complete foot exam   10/29/2024   Medicare Annual Wellness Visit  12/20/2024   DTaP/Tdap/Td vaccine (4 - Td or Tdap) 09/23/2031   Pneumonia Vaccine  Completed   Flu Shot  Completed   Zoster (Shingles) Vaccine  Completed   HPV Vaccine  Aged Out   Colon Cancer Screening  Discontinued   Hepatitis C Screening  Discontinued    Advanced directives: (In Chart) A copy of your advanced directives are scanned into your chart should your provider ever need it.  Next Medicare Annual Wellness Visit scheduled for next year: Yes

## 2023-12-28 ENCOUNTER — Ambulatory Visit (HOSPITAL_BASED_OUTPATIENT_CLINIC_OR_DEPARTMENT_OTHER): Payer: Medicare Other | Admitting: Pulmonary Disease

## 2023-12-28 ENCOUNTER — Encounter (HOSPITAL_BASED_OUTPATIENT_CLINIC_OR_DEPARTMENT_OTHER): Payer: Self-pay | Admitting: Pulmonary Disease

## 2023-12-28 VITALS — BP 134/70 | HR 64 | Ht 70.0 in | Wt 251.0 lb

## 2023-12-28 DIAGNOSIS — G4733 Obstructive sleep apnea (adult) (pediatric): Secondary | ICD-10-CM | POA: Diagnosis not present

## 2023-12-28 DIAGNOSIS — H16002 Unspecified corneal ulcer, left eye: Secondary | ICD-10-CM | POA: Diagnosis not present

## 2023-12-28 NOTE — Patient Instructions (Signed)
 CPAP is effective on current settings

## 2023-12-28 NOTE — Progress Notes (Signed)
   Subjective:    Patient ID: [name], male    DOB: 1946-08-19, 78 y.o.   MRN: [id number]  HPI  78 yo  veteran  for FU of OSA. He had a sleep study done at [facility] 20 years ago and has been maintained on CPAP since then.   He obtained a new AutoSet 10 machine in 2022 , set between 6 to 20 cm   PMH  CAD s/p CABG 2014 -cardiac catheterization 06/09/2023 for NSTEMI, showing patent LIMA to LAD vein graft to PDA. Vein graft to ramus occluded.    atrial fibrillation, hypertension, hyperlipidemia,  chronic kidney disease, diabetes mellitus,  Pancreatic cancer   Discussed the use of AI scribe software for clinical note transcription with the patient, who gave verbal consent to proceed.  History of Present Illness   The patient, with a history of sleep apnea and neuropathy, reports overall improvement in his symptoms. He has been using his CPAP machine regularly and feels rested upon waking. He reports dry mouth, which he attributes to cessation of alcohol  consumption. He had been having trouble sleeping due to neuropathy in his feet, but this has improved since his medication was increased from 300 to 600. He now sleeps in his bed every night and uses his CPAP machine consistently. Previously, discomfort from his neuropathy would cause him to sleep in a chair, during which time he would not use his CPAP machine. He denies any excessive sleepiness from the increased gabapentin  dosage.     CPAP download was reviewed which shows excellent control of events on auto settings 10 to 15 cm with average pressure of 12 and max pressure of 13 cm.  He is very compliant more than 7 hours per night.  There is minimal leak.  CPAP certainly helped improve his daytime somnolence and fatigue  Review of Systems neg for any significant sore throat, dysphagia, itching, sneezing, nasal congestion or excess/ purulent secretions, fever, chills, sweats, unintended wt loss, pleuritic or exertional cp,  hempoptysis, orthopnea pnd or change in chronic leg swelling. Also denies presyncope, palpitations, heartburn, abdominal pain, nausea, vomiting, diarrhea or change in bowel or urinary habits, dysuria,hematuria, rash, arthralgias, visual complaints, headache, numbness weakness or ataxia.     Objective:   Physical Exam  Gen. Pleasant, obese, in no distress ENT - no lesions, no post nasal drip Neck: No JVD, no thyromegaly, no carotid bruits Lungs: no use of accessory muscles, no dullness to percussion, decreased without rales or rhonchi  Cardiovascular: Rhythm regular, heart sounds  normal, no murmurs or gallops, no peripheral edema Musculoskeletal: No deformities, no cyanosis or clubbing , no tremors       Assessment & Plan:    Assessment and Plan    Obstructive Sleep Apnea (OSA) OSA managed with CPAP therapy. Reports feeling rested upon waking with a good mask seal. CPAP settings adjusted to 10-15 cm H2O, typically requiring 12-13 cm H2O. Apnea events decreased to 3 per hour, within the acceptable range (less than 5). - Continue current CPAP settings - Annual follow-up - Confirm report receipt prior to appointment  Peripheral Neuropathy Peripheral neuropathy causing sleep disturbances. Gabapentin  dosage increased from 300 mg to 600 mg, improving sleep and consistent CPAP use. - Continue gabapentin  600 mg  General Health Maintenance Vaccinations up to date, including flu and RSV shots. - No additional actions required.

## 2023-12-31 ENCOUNTER — Other Ambulatory Visit: Payer: Self-pay | Admitting: Podiatry

## 2023-12-31 ENCOUNTER — Encounter: Payer: Self-pay | Admitting: Podiatry

## 2023-12-31 MED ORDER — GABAPENTIN 300 MG PO CAPS: 600.0000 mg | ORAL_CAPSULE | Freq: Every day | ORAL | 3 refills | Status: DC

## 2024-01-04 DIAGNOSIS — H16002 Unspecified corneal ulcer, left eye: Secondary | ICD-10-CM | POA: Diagnosis not present

## 2024-01-07 ENCOUNTER — Ambulatory Visit (HOSPITAL_BASED_OUTPATIENT_CLINIC_OR_DEPARTMENT_OTHER): Payer: Medicare Other | Admitting: Cardiology

## 2024-01-22 ENCOUNTER — Inpatient Hospital Stay (HOSPITAL_BASED_OUTPATIENT_CLINIC_OR_DEPARTMENT_OTHER): Payer: Medicare Other | Admitting: Oncology

## 2024-01-22 ENCOUNTER — Inpatient Hospital Stay: Payer: Medicare Other

## 2024-01-22 ENCOUNTER — Encounter: Payer: Self-pay | Admitting: Oncology

## 2024-01-22 ENCOUNTER — Other Ambulatory Visit: Payer: Medicare Other

## 2024-01-22 ENCOUNTER — Inpatient Hospital Stay: Payer: Medicare Other | Attending: Nurse Practitioner

## 2024-01-22 VITALS — BP 142/69 | HR 78 | Temp 98.1°F | Resp 18 | Ht 70.0 in | Wt 256.7 lb

## 2024-01-22 DIAGNOSIS — E1159 Type 2 diabetes mellitus with other circulatory complications: Secondary | ICD-10-CM | POA: Diagnosis not present

## 2024-01-22 DIAGNOSIS — D649 Anemia, unspecified: Secondary | ICD-10-CM | POA: Insufficient documentation

## 2024-01-22 DIAGNOSIS — C251 Malignant neoplasm of body of pancreas: Secondary | ICD-10-CM | POA: Insufficient documentation

## 2024-01-22 DIAGNOSIS — N289 Disorder of kidney and ureter, unspecified: Secondary | ICD-10-CM | POA: Insufficient documentation

## 2024-01-22 DIAGNOSIS — Z87891 Personal history of nicotine dependence: Secondary | ICD-10-CM | POA: Insufficient documentation

## 2024-01-22 DIAGNOSIS — K2960 Other gastritis without bleeding: Secondary | ICD-10-CM

## 2024-01-22 DIAGNOSIS — I4891 Unspecified atrial fibrillation: Secondary | ICD-10-CM | POA: Diagnosis not present

## 2024-01-22 DIAGNOSIS — Z9081 Acquired absence of spleen: Secondary | ICD-10-CM

## 2024-01-22 DIAGNOSIS — Z8546 Personal history of malignant neoplasm of prostate: Secondary | ICD-10-CM | POA: Diagnosis not present

## 2024-01-22 DIAGNOSIS — N1831 Chronic kidney disease, stage 3a: Secondary | ICD-10-CM

## 2024-01-22 DIAGNOSIS — I10 Essential (primary) hypertension: Secondary | ICD-10-CM | POA: Insufficient documentation

## 2024-01-22 DIAGNOSIS — Z7901 Long term (current) use of anticoagulants: Secondary | ICD-10-CM | POA: Diagnosis not present

## 2024-01-22 DIAGNOSIS — Z95828 Presence of other vascular implants and grafts: Secondary | ICD-10-CM

## 2024-01-22 MED ORDER — SODIUM CHLORIDE 0.9% FLUSH
10.0000 mL | Freq: Once | INTRAVENOUS | Status: AC
  Administered 2024-01-22: 10 mL

## 2024-01-22 MED ORDER — HEPARIN SOD (PORK) LOCK FLUSH 100 UNIT/ML IV SOLN
500.0000 [IU] | Freq: Once | INTRAVENOUS | Status: AC
  Administered 2024-01-22: 500 [IU]

## 2024-01-22 NOTE — Progress Notes (Signed)
 [facility] OFFICE PROGRESS NOTE   Diagnosis: Pancreas cancer  INTERVAL HISTORY:   [name] returns as scheduled.  He generally feels well.  Good appetite.  A Port-A-Cath remains in place.  He had an episode of chest pain yesterday.  The pain was relieved with nitroglycerin .  He is scheduled to see cardiology within the next few months.  He exercised at the gym this week.  Objective:  Vital signs in last 24 hours:  Blood pressure (!) 142/69, pulse 78, temperature 98.1 F (36.7 C), resp. rate 18, height 5' 10 (1.778 m), weight 256 lb 11.2 oz (116.4 kg), SpO2 98%.    Lymphatics: No cervical, supraclavicular, axillary, or inguinal nodes Resp: Lungs clear bilaterally Cardio: Regular rate and rhythm GI: No hepatosplenomegaly, no mass, nontender Vascular: Trace edema with chronic stasis change at the right lower leg   Portacath/PICC-without erythema  Lab Results:  Lab Results  Component Value Date   WBC 9.8 09/07/2023   HGB 14.7 09/07/2023   HCT 44.9 09/07/2023   MCV 100.2 (H) 09/07/2023   PLT 257 09/07/2023   NEUTROABS 5.2 08/07/2023    CMP  Lab Results  Component Value Date   NA 136 09/07/2023   K 3.5 09/07/2023   CL 100 09/07/2023   CO2 23 09/07/2023   GLUCOSE 154 (H) 09/07/2023   BUN 24 (H) 09/07/2023   CREATININE 1.54 (H) 09/07/2023   CALCIUM  9.1 09/07/2023   PROT 6.9 08/07/2023   ALBUMIN  3.8 08/07/2023   AST 27 08/07/2023   ALT 19 08/07/2023   ALKPHOS 111 08/07/2023   BILITOT 0.7 08/07/2023   GFRNONAA 46 (L) 09/07/2023   GFRAA 49 (L) 10/10/2020    Lab Results  Component Value Date   RJW800 23 08/07/2023    Medications: I have reviewed the patient's current medications.   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  No pancreatic ductal dilatation identified.  No definite signs of metastatic disease in the chest, abdomen or pelvis. Cycle 1 gemcitabine /Abraxane  03/01/2021 Cycle 2 gemcitabine /Abraxane  03/14/2021 Cycle 3 gemcitabine /Abraxane  03/28/2021 Cycle 4 gemcitabine /Abraxane  04/17/2021 Cycle 5 gemcitabine /Abraxane  05/01/2021, Zofran /Decadron  added Cycle 6 gemcitabine /Abraxane  05/15/2021 CT pancreas protocol 05/27/2021-unchanged 8 mm exophytic low-attenuation lesion at the posterior body of the pancreas 07/01/2021-distal pancreatectomy/splenectomy, 0.8 cm poorly differentiated adenocarcinoma, treatment response score-2, largest single foci of remaining tumor 0.2 cm, negative resection margins, 0/6 lymph nodes,ypT1bypNo, PanIN-1b Cycle 7 gemcitabine /Abraxane  08/14/2021 Cycle 8 gemcitabine /Abraxane  09/04/2021 Cycle 9 gemcitabine /Abraxane  09/18/2021 Cycle 10 gemcitabine /Abraxane  10/01/2021 Cycle 11 gemcitabine /Abraxane  10/16/2021 Cycle 12 gemcitabine /Abraxane  10/29/2021, Abraxane  held secondary to neuropathy CT abdomen/pelvis 02/06/2022-acute interstitial edematous pancreatitis with small 1.6 cm acute peripancreatic fluid collection within the superior aspect of the pancreatic head; interval postoperative changes of distal pancreatectomy and splenectomy; stable benign left adrenal adenoma Diabetes Coronary artery disease NSTEMI 06/08/2023 Prostate cancer 2013-treated with radiation at [facility] Hypertension Sleep apnea 7.  Coronary artery bypass surgery 2004 8.  Hospital admission 03/04/2021-syncope, A. Fib-started on Eliquis  9.  Mild  thrombocytopenia following cycle 1 gemcitabine /Abraxane -resolved 10.  Anemia 11.  Admission 09/22/2021 with a right metatarsal ulcer and right leg cellulitis 12.  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  Renal insufficiency 14.  Right fifth toe callus/ulcer followed by podiatry and the wound clinic     Disposition: Bill. Gladis is in clinical remission from pancreas cancer.  Will follow-up on the CA 19-9 from today.  He would like to keep the Port-A-Cath in place.  He will return for a Port-A-Cath flush every 8 weeks.  He will be scheduled for an office visit in 6 months.  He will follow-up with cardiology for management of CAD.  Arley Hof, MD  01/22/2024  11:56 AM

## 2024-01-24 LAB — CANCER ANTIGEN 19-9: CA 19-9: 21 U/mL (ref 0–35)

## 2024-02-02 DIAGNOSIS — R739 Hyperglycemia, unspecified: Secondary | ICD-10-CM | POA: Diagnosis not present

## 2024-02-02 DIAGNOSIS — I469 Cardiac arrest, cause unspecified: Secondary | ICD-10-CM | POA: Diagnosis not present

## 2024-02-02 DIAGNOSIS — R404 Transient alteration of awareness: Secondary | ICD-10-CM | POA: Diagnosis not present

## 2024-02-02 DIAGNOSIS — R55 Syncope and collapse: Secondary | ICD-10-CM | POA: Diagnosis not present

## 2024-02-04 ENCOUNTER — Telehealth: Payer: Self-pay | Admitting: Family Medicine

## 2024-02-04 NOTE — Telephone Encounter (Signed)
Copied from CRM [id number]. Topic: General - Other >> Feb 04, 2024  2:36 PM [place] A wrote: Reason for CRM: Funeral home call  and asked that Dr. [place] sign and complete Death Certificate for deceased patient.

## 2024-02-05 NOTE — Telephone Encounter (Signed)
Spoke with Ms Monforte today to discuss message I received about [name]'s death. She tells me she found [name] on the floor, no VS's, tried basic CPR then her son took over. EMS arrived, evaluate pt and continue resuscitation measures for about 45 min unsuccessfully. She is not sure about time of death, she found him on the kitchen's floor around 8:10 am. Apparently he was having more frequent CP's, taking nitroglycerin often, no complaints the date of death. Assume cause of death acute MI. Will complete death certificate on DAVE. Tashari Schoenfelder [place], MD

## 2024-02-13 DIAGNOSIS — 419620001 Death: Secondary | SNOMED CT | POA: Diagnosis not present

## 2024-02-13 DEATH — deceased

## 2024-03-14 ENCOUNTER — Ambulatory Visit: Payer: Medicare Other | Admitting: Podiatry

## 2024-03-18 ENCOUNTER — Other Ambulatory Visit: Payer: Medicare Other

## 2024-04-12 ENCOUNTER — Ambulatory Visit (HOSPITAL_BASED_OUTPATIENT_CLINIC_OR_DEPARTMENT_OTHER): Payer: Medicare Other | Admitting: Cardiology

## 2024-05-13 ENCOUNTER — Other Ambulatory Visit: Payer: Medicare Other

## 2024-07-08 ENCOUNTER — Ambulatory Visit: Payer: Medicare Other | Admitting: Oncology

## 2024-07-08 ENCOUNTER — Other Ambulatory Visit: Payer: Medicare Other
